# Patient Record
Sex: Male | Born: 1970 | Race: Black or African American | Hispanic: Refuse to answer | Smoking: Current every day smoker
Health system: Southern US, Community
[De-identification: ages and names within clinical notes are randomized; demographics above are authoritative.]

## PROBLEM LIST (undated history)

## (undated) DIAGNOSIS — F4310 Post-traumatic stress disorder, unspecified: Secondary | ICD-10-CM

## (undated) DIAGNOSIS — F419 Anxiety disorder, unspecified: Secondary | ICD-10-CM

## (undated) DIAGNOSIS — I5022 Chronic systolic (congestive) heart failure: Secondary | ICD-10-CM

## (undated) DIAGNOSIS — IMO0001 Reserved for inherently not codable concepts without codable children: Secondary | ICD-10-CM

## (undated) DIAGNOSIS — I509 Heart failure, unspecified: Secondary | ICD-10-CM

## (undated) DIAGNOSIS — K219 Gastro-esophageal reflux disease without esophagitis: Secondary | ICD-10-CM

## (undated) DIAGNOSIS — G4733 Obstructive sleep apnea (adult) (pediatric): Secondary | ICD-10-CM

## (undated) DIAGNOSIS — Z95810 Presence of automatic (implantable) cardiac defibrillator: Secondary | ICD-10-CM

## (undated) DIAGNOSIS — I428 Other cardiomyopathies: Secondary | ICD-10-CM

## (undated) DIAGNOSIS — Z531 Procedure and treatment not carried out because of patient's decision for reasons of belief and group pressure: Secondary | ICD-10-CM

## (undated) DIAGNOSIS — Z720 Tobacco use: Secondary | ICD-10-CM

## (undated) DIAGNOSIS — F1490 Cocaine use, unspecified, uncomplicated: Secondary | ICD-10-CM

## (undated) DIAGNOSIS — I10 Essential (primary) hypertension: Secondary | ICD-10-CM

## (undated) DIAGNOSIS — Z950 Presence of cardiac pacemaker: Secondary | ICD-10-CM

## (undated) DIAGNOSIS — E785 Hyperlipidemia, unspecified: Secondary | ICD-10-CM

## (undated) DIAGNOSIS — E119 Type 2 diabetes mellitus without complications: Secondary | ICD-10-CM

## (undated) HISTORY — PX: FOOT FRACTURE SURGERY: SHX645

## (undated) HISTORY — PX: TONSILLECTOMY: SUR1361

## (undated) HISTORY — PX: BIV ICD INSERTION CRT-D: EP1195

## (undated) HISTORY — PX: ROTATOR CUFF REPAIR: SHX139

---
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
## 2019-01-17 ENCOUNTER — Emergency Department (HOSPITAL_COMMUNITY): Payer: Medicare Other

## 2019-01-17 ENCOUNTER — Encounter (HOSPITAL_COMMUNITY): Payer: Self-pay | Admitting: Emergency Medicine

## 2019-01-17 ENCOUNTER — Other Ambulatory Visit: Payer: Self-pay

## 2019-01-17 ENCOUNTER — Inpatient Hospital Stay (HOSPITAL_COMMUNITY)
Admission: EM | Admit: 2019-01-17 | Discharge: 2019-01-20 | DRG: 918 | Disposition: A | Discharge status: Home / Self Care | Payer: Medicare Other | Attending: Family Medicine | Admitting: Family Medicine

## 2019-01-17 DIAGNOSIS — I110 Hypertensive heart disease with heart failure: Secondary | ICD-10-CM | POA: Diagnosis present

## 2019-01-17 DIAGNOSIS — G43909 Migraine, unspecified, not intractable, without status migrainosus: Secondary | ICD-10-CM | POA: Diagnosis present

## 2019-01-17 DIAGNOSIS — Z801 Family history of malignant neoplasm of trachea, bronchus and lung: Secondary | ICD-10-CM

## 2019-01-17 DIAGNOSIS — E119 Type 2 diabetes mellitus without complications: Secondary | ICD-10-CM

## 2019-01-17 DIAGNOSIS — R079 Chest pain, unspecified: Secondary | ICD-10-CM | POA: Diagnosis not present

## 2019-01-17 DIAGNOSIS — E1169 Type 2 diabetes mellitus with other specified complication: Secondary | ICD-10-CM

## 2019-01-17 DIAGNOSIS — Z880 Allergy status to penicillin: Secondary | ICD-10-CM

## 2019-01-17 DIAGNOSIS — Z79899 Other long term (current) drug therapy: Secondary | ICD-10-CM

## 2019-01-17 DIAGNOSIS — Z8249 Family history of ischemic heart disease and other diseases of the circulatory system: Secondary | ICD-10-CM

## 2019-01-17 DIAGNOSIS — F4310 Post-traumatic stress disorder, unspecified: Secondary | ICD-10-CM | POA: Diagnosis present

## 2019-01-17 DIAGNOSIS — Z7902 Long term (current) use of antithrombotics/antiplatelets: Secondary | ICD-10-CM

## 2019-01-17 DIAGNOSIS — R202 Paresthesia of skin: Secondary | ICD-10-CM

## 2019-01-17 DIAGNOSIS — F418 Other specified anxiety disorders: Secondary | ICD-10-CM | POA: Diagnosis present

## 2019-01-17 DIAGNOSIS — F17210 Nicotine dependence, cigarettes, uncomplicated: Secondary | ICD-10-CM | POA: Diagnosis present

## 2019-01-17 DIAGNOSIS — F1410 Cocaine abuse, uncomplicated: Secondary | ICD-10-CM | POA: Diagnosis present

## 2019-01-17 DIAGNOSIS — I272 Pulmonary hypertension, unspecified: Secondary | ICD-10-CM | POA: Diagnosis present

## 2019-01-17 DIAGNOSIS — R0789 Other chest pain: Secondary | ICD-10-CM

## 2019-01-17 DIAGNOSIS — R519 Headache, unspecified: Secondary | ICD-10-CM

## 2019-01-17 DIAGNOSIS — F1420 Cocaine dependence, uncomplicated: Secondary | ICD-10-CM

## 2019-01-17 DIAGNOSIS — K219 Gastro-esophageal reflux disease without esophagitis: Secondary | ICD-10-CM | POA: Diagnosis present

## 2019-01-17 DIAGNOSIS — Z20828 Contact with and (suspected) exposure to other viral communicable diseases: Secondary | ICD-10-CM | POA: Diagnosis present

## 2019-01-17 DIAGNOSIS — Z91040 Latex allergy status: Secondary | ICD-10-CM

## 2019-01-17 DIAGNOSIS — F1010 Alcohol abuse, uncomplicated: Secondary | ICD-10-CM | POA: Diagnosis not present

## 2019-01-17 DIAGNOSIS — Z794 Long term (current) use of insulin: Secondary | ICD-10-CM

## 2019-01-17 DIAGNOSIS — E785 Hyperlipidemia, unspecified: Secondary | ICD-10-CM | POA: Diagnosis present

## 2019-01-17 DIAGNOSIS — I252 Old myocardial infarction: Secondary | ICD-10-CM

## 2019-01-17 DIAGNOSIS — I5022 Chronic systolic (congestive) heart failure: Secondary | ICD-10-CM | POA: Diagnosis not present

## 2019-01-17 DIAGNOSIS — T405X1A Poisoning by cocaine, accidental (unintentional), initial encounter: Principal | ICD-10-CM | POA: Diagnosis present

## 2019-01-17 DIAGNOSIS — H538 Other visual disturbances: Secondary | ICD-10-CM

## 2019-01-17 DIAGNOSIS — Z720 Tobacco use: Secondary | ICD-10-CM | POA: Diagnosis present

## 2019-01-17 DIAGNOSIS — I10 Essential (primary) hypertension: Secondary | ICD-10-CM | POA: Diagnosis present

## 2019-01-17 DIAGNOSIS — I428 Other cardiomyopathies: Secondary | ICD-10-CM | POA: Diagnosis present

## 2019-01-17 DIAGNOSIS — M542 Cervicalgia: Secondary | ICD-10-CM | POA: Diagnosis present

## 2019-01-17 DIAGNOSIS — G44209 Tension-type headache, unspecified, not intractable: Secondary | ICD-10-CM

## 2019-01-17 DIAGNOSIS — E1165 Type 2 diabetes mellitus with hyperglycemia: Secondary | ICD-10-CM | POA: Diagnosis present

## 2019-01-17 DIAGNOSIS — Z888 Allergy status to other drugs, medicaments and biological substances status: Secondary | ICD-10-CM

## 2019-01-17 DIAGNOSIS — Z950 Presence of cardiac pacemaker: Secondary | ICD-10-CM | POA: Diagnosis present

## 2019-01-17 DIAGNOSIS — G44201 Tension-type headache, unspecified, intractable: Secondary | ICD-10-CM | POA: Diagnosis present

## 2019-01-17 DIAGNOSIS — Z886 Allergy status to analgesic agent status: Secondary | ICD-10-CM

## 2019-01-17 DIAGNOSIS — Z91041 Radiographic dye allergy status: Secondary | ICD-10-CM

## 2019-01-17 DIAGNOSIS — R0602 Shortness of breath: Secondary | ICD-10-CM | POA: Diagnosis not present

## 2019-01-17 HISTORY — DX: Essential (primary) hypertension: I10

## 2019-01-17 HISTORY — DX: Anxiety disorder, unspecified: F41.9

## 2019-01-17 HISTORY — DX: Post-traumatic stress disorder, unspecified: F43.10

## 2019-01-17 HISTORY — DX: Gastro-esophageal reflux disease without esophagitis: K21.9

## 2019-01-17 HISTORY — DX: Hyperlipidemia, unspecified: E78.5

## 2019-01-17 HISTORY — DX: Type 2 diabetes mellitus without complications: E11.9

## 2019-01-17 LAB — CBC
HCT: 45.2 % (ref 39.0–52.0)
Hemoglobin: 14.2 g/dL (ref 13.0–17.0)
MCH: 28.7 pg (ref 26.0–34.0)
MCHC: 31.4 g/dL (ref 30.0–36.0)
MCV: 91.3 fL (ref 80.0–100.0)
Platelets: 258 10*3/uL (ref 150–400)
RBC: 4.95 MIL/uL (ref 4.22–5.81)
RDW: 13 % (ref 11.5–15.5)
WBC: 10.6 10*3/uL — ABNORMAL HIGH (ref 4.0–10.5)
nRBC: 0 % (ref 0.0–0.2)

## 2019-01-17 LAB — BASIC METABOLIC PANEL
Anion gap: 9 (ref 5–15)
BUN: 10 mg/dL (ref 6–20)
CO2: 26 mmol/L (ref 22–32)
Calcium: 8.8 mg/dL — ABNORMAL LOW (ref 8.9–10.3)
Chloride: 102 mmol/L (ref 98–111)
Creatinine, Ser: 0.84 mg/dL (ref 0.61–1.24)
GFR calc Af Amer: >60 mL/min (ref >60)
GFR calc non Af Amer: >60 mL/min (ref >60)
Glucose, Bld: 110 mg/dL — ABNORMAL HIGH (ref 70–99)
Potassium: 3.7 mmol/L (ref 3.5–5.1)
Sodium: 137 mmol/L (ref 135–145)

## 2019-01-17 LAB — DIFFERENTIAL
Abs Immature Granulocytes: 0.04 10*3/uL (ref 0.00–0.07)
Basophils Absolute: 0 10*3/uL (ref 0.0–0.1)
Basophils Relative: 0 %
Eosinophils Absolute: 0.1 10*3/uL (ref 0.0–0.5)
Eosinophils Relative: 1 %
Immature Granulocytes: 0 %
Lymphocytes Relative: 28 %
Lymphs Abs: 3 10*3/uL (ref 0.7–4.0)
Monocytes Absolute: 0.9 10*3/uL (ref 0.1–1.0)
Monocytes Relative: 9 %
Neutro Abs: 6.5 10*3/uL (ref 1.7–7.7)
Neutrophils Relative %: 62 %

## 2019-01-17 LAB — APTT: aPTT: 29 seconds (ref 24–36)

## 2019-01-17 LAB — TROPONIN I (HIGH SENSITIVITY)
Troponin I (High Sensitivity): 8 ng/L (ref <18)
Troponin I (High Sensitivity): 8 ng/L (ref <18)

## 2019-01-17 LAB — PROTIME-INR
INR: 0.9 (ref 0.8–1.2)
Prothrombin Time: 12.2 seconds (ref 11.4–15.2)

## 2019-01-17 LAB — CBG MONITORING, ED: Glucose-Capillary: 101 mg/dL — ABNORMAL HIGH (ref 70–99)

## 2019-01-17 MED ORDER — SODIUM CHLORIDE 0.9% FLUSH
3.0000 mL | Freq: Once | INTRAVENOUS | Status: AC
Start: 2019-01-17 — End: 2019-01-18
  Administered 2019-01-18: 15:00:00 3 mL via INTRAVENOUS

## 2019-01-17 MED ORDER — SODIUM CHLORIDE 0.9% FLUSH: 3.0000 mL | Freq: Once | INTRAVENOUS | Status: DC

## 2019-01-17 NOTE — ED Notes (Signed)
Pacemaker/defib successfully interrogated report provided to ER MD

## 2019-01-17 NOTE — ED Notes (Signed)
ED TO INPATIENT HANDOFF REPORT  ED Nurse Name and Phone #: Magnus Ivan, RN [phone]  S Name/Age/Gender Luis [name] 48 y.o. male Room/Bed: 021C/021C  Code Status   Code Status: Not on file  Home/SNF/Other Skilled nursing facility Patient oriented to: self, time and situation Is this baseline? No   Triage Complete: Triage complete  Chief Complaint chest pain/arm tingling  Triage Note Pt from a residential facility for vets coming via EMS. Pt started to have sharp pain in center of his chest and tingling in his chest and back of neck at 1300 yesterday. Neck pain has been constant and cp has been intermittent. 1900 last night, pt started having blurred vision and some tingling in left arm/hand. Intermittently since last night. Pt now complains of some tingling in left foot.  Pt has hx of CHF and has pacemaker/defib. No droop, no slurred speech, no one sided weakness. Pt thinks his CBG is lower for normal- he has been running in the 300's and today it is 87. Pt told EMS he started having SOB and nausea today.    Allergies Allergies  Allergen Reactions  . Asa [Aspirin]     Throat closing   . Augmentin [Amoxicillin-Pot Clavulanate]   . Contrast Media [Iodinated Diagnostic Agents]   . Lisinopril   . Penicillins     Level of Care/Admitting Diagnosis ED Disposition    None      B Medical/Surgery History Past Medical History:  Diagnosis Date  . Anxiety   . CHF (congestive heart failure) (HCC)   . Diabetes mellitus without complication (HCC)   . GERD (gastroesophageal reflux disease)   . HLD (hyperlipidemia)   . Hypertension   . Pacemaker    defib as well   History reviewed. No pertinent surgical history.   A IV Location/Drains/Wounds Patient Lines/Drains/Airways Status   Active Line/Drains/Airways    Name:   Placement date:   Placement time:   Site:   Days:   Peripheral IV 01/17/19 Left Antecubital   01/17/19    2145    Antecubital   less than 1           Intake/Output Last 24 hours No intake or output data in the 24 hours ending 01/17/19 2246  Labs/Imaging Results for orders placed or performed during the hospital encounter of 01/17/19 (from the past 48 hour(s))  Basic metabolic panel     Status: Abnormal   Collection Time: 01/17/19  6:17 PM  Result Value Ref Range   Sodium 137 135 - 145 mmol/L   Potassium 3.7 3.5 - 5.1 mmol/L   Chloride 102 98 - 111 mmol/L   CO2 26 22 - 32 mmol/L   Glucose, Bld 110 (H) 70 - 99 mg/dL   BUN 10 6 - 20 mg/dL   Creatinine, Ser 0.93 0.61 - 1.24 mg/dL   Calcium 8.8 (L) 8.9 - 10.3 mg/dL   GFR calc non Af Amer >60 >60 mL/min   GFR calc Af Amer >60 >60 mL/min   Anion gap 9 5 - 15    Comment: Performed at [facility], [address] [place], [place] [address]  CBC     Status: Abnormal   Collection Time: 01/17/19  6:17 PM  Result Value Ref Range   WBC 10.6 (H) 4.0 - 10.5 K/uL   RBC 4.95 4.22 - 5.81 MIL/uL   Hemoglobin 14.2 13.0 - 17.0 g/dL   HCT 24.4 69.5 - 07.2 %   MCV 91.3 80.0 - 100.0  fL   MCH 28.7 26.0 - 34.0 pg   MCHC 31.4 30.0 - 36.0 g/dL   RDW 13.0 11.5 - 15.5 %   Platelets 258 150 - 400 K/uL   nRBC 0.0 0.0 - 0.2 %    Comment: Performed at [facility], [place] [address], [place], [place] [address]  Troponin I (High Sensitivity)     Status: None   Collection Time: 01/17/19  6:17 PM  Result Value Ref Range   Troponin I (High Sensitivity) 8 <18 ng/L    Comment: (NOTE) Elevated high sensitivity troponin I (hsTnI) values and significant  changes across serial measurements may suggest ACS but many other  chronic and acute conditions are known to elevate hsTnI results.  Refer to the "Links" section for chest pain algorithms and additional  guidance. Performed at [facility], [place] [address], [place], [place] [address]   Protime-INR     Status: None   Collection Time: 01/17/19  6:17 PM  Result Value Ref Range   Prothrombin Time 12.2 11.4 - 15.2 seconds   INR 0.9 0.8 -  1.2    Comment: (NOTE) INR goal varies based on device and disease states. Performed at [facility], [place] [address], [place], [place] [address]   APTT     Status: None   Collection Time: 01/17/19  6:17 PM  Result Value Ref Range   aPTT 29 24 - 36 seconds    Comment: Performed at [facility] [address] [place], [place] [address]  Differential     Status: None   Collection Time: 01/17/19  6:17 PM  Result Value Ref Range   Neutrophils Relative % 62 %   Neutro Abs 6.5 1.7 - 7.7 K/uL   Lymphocytes Relative 28 %   Lymphs Abs 3.0 0.7 - 4.0 K/uL   Monocytes Relative 9 %   Monocytes Absolute 0.9 0.1 - 1.0 K/uL   Eosinophils Relative 1 %   Eosinophils Absolute 0.1 0.0 - 0.5 K/uL   Basophils Relative 0 %   Basophils Absolute 0.0 0.0 - 0.1 K/uL   Immature Granulocytes 0 %   Abs Immature Granulocytes 0.04 0.00 - 0.07 K/uL    Comment: Performed at [facility], [place] [address] [place], [place] [address]  Troponin I (High Sensitivity)     Status: None   Collection Time: 01/17/19  7:50 PM  Result Value Ref Range   Troponin I (High Sensitivity) 8 <18 ng/L    Comment: (NOTE) Elevated high sensitivity troponin I (hsTnI) values and significant  changes across serial measurements may suggest ACS but many other  chronic and acute conditions are known to elevate hsTnI results.  Refer to the "Links" section for chest pain algorithms and additional  guidance. Performed at [facility], [place] [address] [place],  [address]   CBG monitoring, ED     Status: Abnormal   Collection Time: 01/17/19  7:52 PM  Result Value Ref Range   Glucose-Capillary 101 (H) 70 - 99 mg/dL   Dg Chest 2 View  Result Date: 01/17/2019 CLINICAL DATA:  Central chest pain, shortness of breath and dizziness over the last day. EXAM: CHEST - 2 VIEW COMPARISON:  07/18/2018 FINDINGS: The heart is enlarged. Pacemaker/AICD in place. There is pulmonary venous hypertension but no frank edema. No  infiltrate, collapse or effusion. Lateral film is degraded by motion. IMPRESSION: Cardiomegaly. Pulmonary venous hypertension without frank edema. Slight worsening since yesterday.  Electronically Signed   By: Paulina FusiMark  Shogry M.D.   On: 01/17/2019 18:50   Ct Head Wo Contrast  Result Date: 01/17/2019 CLINICAL DATA:  Blurred vision and tingling of the left arm and hand beginning yesterday. EXAM: CT HEAD WITHOUT CONTRAST TECHNIQUE: Contiguous axial images were obtained from the base of the skull through the vertex without intravenous contrast. COMPARISON:  None. FINDINGS: Brain: Brain does not show any evidence of old or acute infarction, mass lesion, hemorrhage, hydrocephalus or extra-axial collection. Vascular: No abnormal vascular finding. Skull: Normal Sinuses/Orbits: Clear/normal Other: None IMPRESSION: Normal head CT. Electronically Signed   By: Paulina FusiMark  Shogry M.D.   On: 01/17/2019 19:51    Pending Labs Unresulted Labs (From admission, onward)    Start     Ordered   01/17/19 2238  SARS Coronavirus 2 (CEPHEID - Performed in [facility] lab), Hosp Order  (Asymptomatic Patients Labs)  ONCE - STAT,   STAT    Question:  Rule Out  Answer:  Yes   01/17/19 2238          Vitals/Pain Today's Vitals   01/17/19 2015 01/17/19 2030 01/17/19 2045 01/17/19 2100  BP: 118/67 (!) 106/56 122/72 (!) 121/102  Pulse: 77 74 78 80  Resp: 18 12 (!) 23 (!) 23  Temp:      TempSrc:      SpO2: 94% 92% 94% 92%  PainSc:        Isolation Precautions No active isolations  Medications Medications  sodium chloride flush (NS) 0.9 % injection 3 mL (has no administration in time range)  sodium chloride flush (NS) 0.9 % injection 3 mL (has no administration in time range)    Mobility walks Low fall risk   Focused Assessments Neuro Assessment Handoff:  Swallow screen pass? Yes  Cardiac Rhythm: Normal sinus rhythm NIH Stroke Scale ( + Modified Stroke Scale Criteria)  LOC Questions (1b. )   +: Answers  both questions correctly LOC Commands (1c. )   + : Performs both tasks correctly Best Gaze (2. )  +: Normal Visual (3. )  +: No visual loss Motor Arm, Left (5a. )   +: No drift Motor Arm, Right (5b. )   +: No drift Motor Leg, Left (6a. )   +: No drift Motor Leg, Right (6b. )   +: No drift Sensory (8. )   +: Mild-to-moderate sensory loss, patient feels pinprick is less sharp or is dull on the affected side, or there is a loss of superficial pain with pinprick, but patient is aware of being touched Best Language (9. )   +: No aphasia Extinction/Inattention (11.)   +: No Abnormality Modified SS Total  +: 1     Neuro Assessment: Within Defined Limits Neuro Checks:      Last Documented NIHSS Modified Score: 1 (01/17/19 2000) Has TPA been given? No If patient is a Neuro Trauma and patient is going to OR before floor call report to 4N Charge nurse: [phone] or [phone]     R Recommendations: See Admitting Provider Note  Report given to:   Additional Notes: NIH 1 for sensory; c/o "numbness" / "sensation" on back of neck radiating towards arm. 2 IV access on left side

## 2019-01-17 NOTE — ED Provider Notes (Signed)
[facility] EMERGENCY DEPARTMENT Provider Note   CSN: [id number] Arrival date & time: 01/17/19  1736     History   Chief Complaint Chief Complaint  Patient presents with  . Shortness of Breath  . Chest Pain  . Tingling    HPI Luis [name] is a 48 y.o. male.     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  Patient states around noon yesterday he had a severe 9 out of 10 pain in the center of his chest that went into his neck and his left arm causing some numbness and tingling.  He also stated he had a sensation of dizziness and nausea with it.  The pain lasted for approximately 6 minutes and resolved but he is continued to have sensation of dizziness which he describes as feeling like he may pass out when he tries to stand or walk.  He is felt generally not well and is also had some ongoing pain in his neck and tingling of his left toes.  He denies any weakness in the arm or leg or speech difficulty.  He has been taking all of his medications as prescribed.  Patient states also that his blood sugars have been running in the 300s because he was having difficulty obtaining his insulin due to price and has recently gotten back on his meds and blood sugar today was 87-100.  Patient also admits to being a heavy drinker in the past but has not had any alcohol recently in the last few weeks.  He had a second episode of chest pain today on his way here that lasted for 2 or 3 minutes but has resolved spontaneously.  The symptoms were not exertional in nature but he states it felt like when he had his heart attack.  He denies any recent fluid buildup or weight gain.  He continues to take 80  mg of Lasix daily.  He denies cough, congestion or fever.  He has had no abdominal pain or vomiting or diarrhea but has had some nausea.  The history is provided by the patient.    Past Medical History:  Diagnosis Date  . Anxiety   . CHF (congestive heart failure) ([place])   . Diabetes mellitus without complication ([place])   . GERD (gastroesophageal reflux disease)   . HLD (hyperlipidemia)   . Hypertension   . Pacemaker    defib as well    There are no active problems to display for this patient.   History reviewed. No pertinent surgical history.      Home Medications    Prior to Admission medications   Not on File    Family History History reviewed. No pertinent family history.  Social History Social History   Tobacco Use  . Smoking status: Current Every Day Smoker    Types: Cigarettes  . Smokeless tobacco: Never Used  Substance Use Topics  . Alcohol use: Yes  . Drug use: Not Currently     Allergies   Asa [aspirin], Augmentin [amoxicillin-pot clavulanate], Contrast media [iodinated diagnostic agents], Lisinopril, and Penicillins   Review of Systems Review of Systems  All other systems reviewed and are negative.  Physical Exam Updated Vital Signs BP (!) 121/102   Pulse 80   Temp 98 F (36.7 C) (Oral)   Resp (!) 23   SpO2 92%   Physical Exam Vitals signs and nursing note reviewed.  Constitutional:      General: He is not in acute distress.    Appearance: He is well-developed. He is obese.  HENT:     Head: Normocephalic and atraumatic.  Eyes:     Conjunctiva/sclera: Conjunctivae normal.     Pupils: Pupils are equal, round, and reactive to light.  Neck:     Musculoskeletal: Normal range of motion and neck supple.  Cardiovascular:     Rate and Rhythm: Normal rate and regular rhythm.     Heart sounds: No murmur.     Comments: Pulses present and equal in all ext Pulmonary:     Effort: Pulmonary effort is normal. No respiratory distress.      Breath sounds: Normal breath sounds. No wheezing or rales.  Abdominal:     General: There is no distension.     Palpations: Abdomen is soft.     Tenderness: There is no abdominal tenderness. There is no guarding or rebound.  Musculoskeletal: Normal range of motion.        General: No tenderness.     Right lower leg: No edema.     Left lower leg: No edema.  Skin:    General: Skin is warm and dry.     Findings: No erythema or rash.  Neurological:     Mental Status: He is alert and oriented to person, place, and time.     Comments: Mild subjective numbness in the left toes  Psychiatric:        Behavior: Behavior normal.      ED Treatments / Results  Labs (all labs ordered are listed, but only abnormal results are displayed) Labs Reviewed  BASIC METABOLIC PANEL - Abnormal; Notable for the following components:      Result Value   Glucose, Bld 110 (*)    Calcium 8.8 (*)    All other components within normal limits  CBC - Abnormal; Notable for the following components:   WBC 10.6 (*)    All other components within normal limits  CBG MONITORING, ED - Abnormal; Notable for the following components:   Glucose-Capillary 101 (*)    All other components within normal limits  PROTIME-INR  APTT  DIFFERENTIAL  I-STAT CHEM 8, ED  TROPONIN I (HIGH SENSITIVITY)  TROPONIN I (HIGH SENSITIVITY)    EKG EKG Interpretation  Date/Time:  Tuesday January 17 2019 22:15:28 EDT Ventricular Rate:  81 PR Interval:    QRS Duration: 170 QT Interval:  458 QTC Calculation: 532 R Axis:   74 Text Interpretation:  VENTRICULAR PACED RHYTHM IVCD, consider atypical LBBB Confirmed by Gwyneth Sprout ([id number]) on 01/17/2019 10:52:02 PM   Radiology Dg Chest 2 View  Result Date: 01/17/2019 CLINICAL DATA:  Central chest pain, shortness of breath and dizziness over the last day. EXAM: CHEST - 2 VIEW COMPARISON:  07/18/2018 FINDINGS: The heart is enlarged. Pacemaker/AICD in place. There is pulmonary venous  hypertension but no frank edema. No infiltrate, collapse or effusion. Lateral film is degraded by motion. IMPRESSION: Cardiomegaly. Pulmonary venous hypertension without frank edema. Slight worsening since yesterday. Electronically Signed   By: Paulina Fusi M.D.   On: 01/17/2019 18:50   Ct Head Wo Contrast  Result Date: 01/17/2019 CLINICAL DATA:  Blurred vision and tingling of the left  arm and hand beginning yesterday. EXAM: CT HEAD WITHOUT CONTRAST TECHNIQUE: Contiguous axial images were obtained from the base of the skull through the vertex without intravenous contrast. COMPARISON:  None. FINDINGS: Brain: Brain does not show any evidence of old or acute infarction, mass lesion, hemorrhage, hydrocephalus or extra-axial collection. Vascular: No abnormal vascular finding. Skull: Normal Sinuses/Orbits: Clear/normal Other: None IMPRESSION: Normal head CT. Electronically Signed   By: Paulina FusiMark  Shogry M.D.   On: 01/17/2019 19:51    Procedures Procedures (including critical care time)  Medications Ordered in ED Medications  sodium chloride flush (NS) 0.9 % injection 3 mL (has no administration in time range)  sodium chloride flush (NS) 0.9 % injection 3 mL (has no administration in time range)     Initial Impression / Assessment and Plan / ED Course  I have reviewed the triage vital signs and the nursing notes.  Pertinent labs & imaging results that were available during my care of the patient were reviewed by me and considered in my medical decision making (see chart for details).        48 year old male with significant heart history presenting with chest pain that started yesterday.  He has had 2 episodes of pain but they did not last more than 10 minutes.  He is also generally not felt well today with blurred vision, dizziness upon standing and feeling like he may pass out.  Patient's labs are relatively normal with delta troponin that are negative.  He has equal pulses throughout all extremities  and low suspicion for dissection.  Patient is on Plavix and does not take aspirin due to allergy.  Patient is not having any pain at this time.  Patient denies any infectious symptoms and has sounds bilaterally.  Patient's EKG with a paced rhythm.  [employer] pacer interrogation showed one episode of nonsustained V. tach on 627 that did not require shock.  Otherwise no specific abnormalities.  Patient is a heart score of 5 and given past history, known cardiomyopathy and chest pain feel that he will need a rule out.  It is possible patient's symptoms are related to improve blood sugar.  He had been running in the 300s and recently got back on his insulin and sugar today is 87-100.  However he is not having strokelike symptoms and head CT was negative.  Final Clinical Impressions(s) / ED Diagnoses   Final diagnoses:  Atypical chest pain  Tingling    ED Discharge Orders    None       Gwyneth SproutPlunkett, Kirstin Kugler, MD 01/17/19 2252

## 2019-01-17 NOTE — ED Triage Notes (Signed)
Pt from a residential facility for vets coming via EMS. Pt started to have sharp pain in center of his chest and tingling in his chest and back of neck at 1300 yesterday. Neck pain has been constant and cp has been intermittent. 1900 last night, pt started having blurred vision and some tingling in left arm/hand. Intermittently since last night. Pt now complains of some tingling in left foot.  Pt has hx of CHF and has pacemaker/defib. No droop, no slurred speech, no one sided weakness. Pt thinks his CBG is lower for normal- he has been running in the 300's and today it is 87. Pt told EMS he started having SOB and nausea today.

## 2019-01-17 NOTE — H&P (Signed)
History and Physical    Luis [name] ZOX:[id number] DOB: 12/20/1970 DOA: 01/17/2019  Referring MD/NP/PA:   PCP: System, Pcp Not In   Patient coming from:  The patient is coming from home.  At baseline, pt is independent for most of ADL.        Chief Complaint: chest pain  HPI: Luis [name] is a 48 y.o. male with medical history significant of hypertension, hyperlipidemia, diabetes mellitus, GERD, depression with anxiety, sCHF (EF 25-30% in 04/2018 per EDP), pacemaker placement, polysubstance abuse (tobacco, cocaine, alcohol), CAD, who presents with chest pain.  Patient states that his chest pain started yesterday at about noon.  It is located in the center chest, initially 9 out of 10 in severity, currently chest pain is minimal, initially sharp pain, currently pressure-like pain, radiating to the left arm and the left neck, causing left arm tingling.  It is associated with mild shortness breath, but no cough, fever or chills.   Patient also reports intermittent blurry vision, left foot tingling, left arm tingling, bilateral leg weakness, sometimes poor balance, dizziness, headache.  No vision loss or hearing loss.  No difficulty speaking.  Patient has nausea, but no vomiting, diarrhea or abdominal pain.  Denies symptoms of UTI.  ED Course: pt was found to have troponin VIII which is negative, INR 0.9, pending COVID-19 test, electrolytes renal function okay, temperature normal, blood pressure 121/102, heart rate 80, oxygen saturation 92 to 99% on room air, chest x-ray showed cardiomegaly without pulmonary edema or infiltration.  CT head is negative for acute intracranial abnormalities.  Patient is placed on telemetry bed for observation.  Review of Systems:   General: no fevers, chills, no body weight gain, has fatigue HEENT: no blurry vision, hearing changes or sore throat Respiratory: has dyspnea, no coughing, wheezing CV: has chest pain, no palpitations GI: has nausea, no vomiting,  abdominal pain, diarrhea, constipation GU: no dysuria, burning on urination, increased urinary frequency, hematuria  Ext: no leg edema Neuro: no unilateral weakness, numbness, or tingling, no vision change or hearing loss Skin: no rash, no skin tear. MSK: No muscle spasm, no deformity, no limitation of range of movement in spin Heme: No easy bruising.  Travel history: No recent long distant travel.  Allergy:  Allergies  Allergen Reactions  . Aspirin Anaphylaxis    Throat closing   . Iodine-131 Hives    Hives  . Iodinated Diagnostic Agents Hives  . Latex Rash and Hives  . Penicillins Nausea And Vomiting  . Amoxicillin-Pot Clavulanate Diarrhea  . Lisinopril     Other reaction(s): Cough    Past Medical History:  Diagnosis Date  . Anxiety   . CHF (congestive heart failure) (HCC)   . Diabetes mellitus without complication (HCC)   . GERD (gastroesophageal reflux disease)   . HLD (hyperlipidemia)   . Hypertension   . Pacemaker    defib as well    Past Surgical History:  Procedure Laterality Date  . PACEMAKER PLACEMENT      Social History:  reports that he has been smoking cigarettes. He has never used smokeless tobacco. He reports current alcohol use. He reports previous drug use.  Family History:  Family History  Problem Relation Age of Onset  . Hypertension Mother   . Bone cancer Father   . Lung cancer Father      Prior to Admission medications   Not on File    Physical Exam: Vitals:   01/17/19 2200 01/17/19 2215 01/17/19 2230 01/17/19 2245  BP: 115/65 109/63 108/73 116/78  Pulse: 77 77 79 75  Resp: (!) 1 16 14 13   Temp:      TempSrc:      SpO2: 93% 93% 91% 92%   General: Not in acute distress HEENT:       Eyes: PERRL, EOMI, no scleral icterus.       ENT: No discharge from the ears and nose, no pharynx injection, no tonsillar enlargement.        Neck: No JVD, no bruit, no mass felt. Heme: No neck lymph node enlargement. Cardiac: S1/S2, RRR, No  murmurs, No gallops or rubs. Respiratory: No rales, wheezing, rhonchi or rubs. GI: Soft, nondistended, nontender, no rebound pain, no organomegaly, BS present. GU: No hematuria Ext: No pitting leg edema bilaterally. 2+DP/PT pulse bilaterally. Musculoskeletal: No joint deformities, No joint redness or warmth, no limitation of ROM in spin. Skin: No rashes.  Neuro: Alert, oriented X3, cranial nerves II-XII grossly intact, moves all extremities normally.  Psych: Patient is not psychotic, no suicidal or hemocidal ideation.  Labs on Admission: I have personally reviewed following labs and imaging studies  CBC: Recent Labs  Lab 01/17/19 1817  WBC 10.6*  NEUTROABS 6.5  HGB 14.2  HCT 45.2  MCV 91.3  PLT 258   Basic Metabolic Panel: Recent Labs  Lab 01/17/19 1817  NA 137  K 3.7  CL 102  CO2 26  GLUCOSE 110*  BUN 10  CREATININE 0.84  CALCIUM 8.8*   GFR: CrCl cannot be calculated (Unknown ideal weight.). Liver Function Tests: No results for input(s): AST, ALT, ALKPHOS, BILITOT, PROT, ALBUMIN in the last 168 hours. No results for input(s): LIPASE, AMYLASE in the last 168 hours. No results for input(s): AMMONIA in the last 168 hours. Coagulation Profile: Recent Labs  Lab 01/17/19 1817  INR 0.9   Cardiac Enzymes: No results for input(s): CKTOTAL, CKMB, CKMBINDEX, TROPONINI in the last 168 hours. BNP (last 3 results) No results for input(s): PROBNP in the last 8760 hours. HbA1C: No results for input(s): HGBA1C in the last 72 hours. CBG: Recent Labs  Lab 01/17/19 1952  GLUCAP 101*   Lipid Profile: No results for input(s): CHOL, HDL, LDLCALC, TRIG, CHOLHDL, LDLDIRECT in the last 72 hours. Thyroid Function Tests: No results for input(s): TSH, T4TOTAL, FREET4, T3FREE, THYROIDAB in the last 72 hours. Anemia Panel: No results for input(s): VITAMINB12, FOLATE, FERRITIN, TIBC, IRON, RETICCTPCT in the last 72 hours. Urine analysis: No results found for: COLORURINE,  APPEARANCEUR, LABSPEC, PHURINE, GLUCOSEU, HGBUR, BILIRUBINUR, KETONESUR, PROTEINUR, UROBILINOGEN, NITRITE, LEUKOCYTESUR Sepsis Labs: @LABRCNTIP (procalcitonin:4,lacticidven:4) )No results found for this or any previous visit (from the past 240 hour(s)).   Radiological Exams on Admission: Dg Chest 2 View  Result Date: 01/17/2019 CLINICAL DATA:  Central chest pain, shortness of breath and dizziness over the last day. EXAM: CHEST - 2 VIEW COMPARISON:  07/18/2018 FINDINGS: The heart is enlarged. Pacemaker/AICD in place. There is pulmonary venous hypertension but no frank edema. No infiltrate, collapse or effusion. Lateral film is degraded by motion. IMPRESSION: Cardiomegaly. Pulmonary venous hypertension without frank edema. Slight worsening since yesterday. Electronically Signed   By: Paulina Fusi M.D.   On: 01/17/2019 18:50   Ct Head Wo Contrast  Result Date: 01/17/2019 CLINICAL DATA:  Blurred vision and tingling of the left arm and hand beginning yesterday. EXAM: CT HEAD WITHOUT CONTRAST TECHNIQUE: Contiguous axial images were obtained from the base of the skull through the vertex without intravenous contrast. COMPARISON:  None. FINDINGS: Brain: Brain  does not show any evidence of old or acute infarction, mass lesion, hemorrhage, hydrocephalus or extra-axial collection. Vascular: No abnormal vascular finding. Skull: Normal Sinuses/Orbits: Clear/normal Other: None IMPRESSION: Normal head CT. Electronically Signed   By: Nelson Chimes M.D.   On: 01/17/2019 19:51     EKG: Independently reviewed.  Sinus rhythm, QTC 532, paced rhythm   Assessment/Plan Principal Problem:   Chest pain Active Problems:   Chronic systolic CHF (congestive heart failure) (HCC)   Pacemaker   HLD (hyperlipidemia)   Diabetes mellitus without complication (HCC)   Hypertension   GERD (gastroesophageal reflux disease)   Tobacco abuse   Alcohol abuse   Chest pain: Patient seems to have atypical chest pain.  Initially  sharp pain, now pressure-like pain.  Troponin negative, 8, 8, 9.  - will place on Tele bed for obs - Trend Trop - Repeat EKG in the am  - prn Nitroglycerin, Morphine, plavix, and metoprolol - pt is allergic to ASA - Risk factor stratification: will check FLP and A1C  - check UDS - Card consult was requested via Epic  Chronic systolic CHF (congestive heart failure) ([place]): 2D echo on 09/15/2017 showed EF 50- 55%, per ED physician, patient had EF 25-30% on 04/2018 in another facility.  Patient does not have leg edema or JVD.  No pulmonary edema on chest x-ray.  BNP 38.5 CHF seem to be compensated. -Continue metoprolol and Lasix 80 mg daily  HLD (hyperlipidemia): not taking meds at home -f/u FLP  HTN:  -Continue home medications: Metoprolol, Lasix -also on prazosin -IV hydralazine prn  Diabetes mellitus without complication ([place]): Last A1c not on record. Patient is taking 75/25 insulin at home -will decrease dose of 75/25 insulin from 60 units tid to 40 units tid -SSI  GERD (gastroesophageal reflux disease): -Protonix  Tobacco abuse and Alcohol abuse: -Did counseling about importance of quitting smoking -Nicotine patch -Did counseling about the importance of quitting drinking -CIWA protocol  Left arm tingling, left foot tingling, blurry vision, dizziness: Patient has multiple nonspecific symptoms.  Etiology is not clear.  Not sure if this is referred symptoms of from his chest pain.  CT head is negative for acute intracranial abnormalities.  Cannot do MRI due to presence of pacemaker. -Check vitamin D32, folic acid level, TSH.   DVT ppx: SQ Heparin   Code Status: Full code Family Communication: None at bed side.  Disposition Plan:  Anticipate discharge back to previous home environment Consults called:  none Admission status: Obs / tele    Date of Service 01/17/2019    [facility]   If 7PM-7AM, please contact night-coverage [email or web address] Password  [facility] 01/17/2019, 11:38 PM

## 2019-01-18 ENCOUNTER — Encounter (HOSPITAL_COMMUNITY): Payer: Self-pay | Admitting: Neurology

## 2019-01-18 DIAGNOSIS — R200 Anesthesia of skin: Secondary | ICD-10-CM | POA: Diagnosis not present

## 2019-01-18 DIAGNOSIS — I10 Essential (primary) hypertension: Secondary | ICD-10-CM

## 2019-01-18 DIAGNOSIS — Z95810 Presence of automatic (implantable) cardiac defibrillator: Secondary | ICD-10-CM

## 2019-01-18 DIAGNOSIS — F1410 Cocaine abuse, uncomplicated: Secondary | ICD-10-CM | POA: Diagnosis not present

## 2019-01-18 DIAGNOSIS — G44201 Tension-type headache, unspecified, intractable: Secondary | ICD-10-CM

## 2019-01-18 DIAGNOSIS — Z20828 Contact with and (suspected) exposure to other viral communicable diseases: Secondary | ICD-10-CM | POA: Diagnosis not present

## 2019-01-18 DIAGNOSIS — E785 Hyperlipidemia, unspecified: Secondary | ICD-10-CM | POA: Diagnosis not present

## 2019-01-18 DIAGNOSIS — I252 Old myocardial infarction: Secondary | ICD-10-CM | POA: Diagnosis not present

## 2019-01-18 DIAGNOSIS — Z950 Presence of cardiac pacemaker: Secondary | ICD-10-CM | POA: Diagnosis not present

## 2019-01-18 DIAGNOSIS — E119 Type 2 diabetes mellitus without complications: Secondary | ICD-10-CM | POA: Diagnosis not present

## 2019-01-18 DIAGNOSIS — Z886 Allergy status to analgesic agent status: Secondary | ICD-10-CM | POA: Diagnosis not present

## 2019-01-18 DIAGNOSIS — M542 Cervicalgia: Secondary | ICD-10-CM | POA: Diagnosis present

## 2019-01-18 DIAGNOSIS — F418 Other specified anxiety disorders: Secondary | ICD-10-CM | POA: Diagnosis not present

## 2019-01-18 DIAGNOSIS — R079 Chest pain, unspecified: Secondary | ICD-10-CM | POA: Diagnosis not present

## 2019-01-18 DIAGNOSIS — E1165 Type 2 diabetes mellitus with hyperglycemia: Secondary | ICD-10-CM | POA: Diagnosis not present

## 2019-01-18 DIAGNOSIS — I428 Other cardiomyopathies: Secondary | ICD-10-CM | POA: Diagnosis not present

## 2019-01-18 DIAGNOSIS — R0789 Other chest pain: Secondary | ICD-10-CM

## 2019-01-18 DIAGNOSIS — I272 Pulmonary hypertension, unspecified: Secondary | ICD-10-CM | POA: Diagnosis present

## 2019-01-18 DIAGNOSIS — G43909 Migraine, unspecified, not intractable, without status migrainosus: Secondary | ICD-10-CM | POA: Diagnosis present

## 2019-01-18 DIAGNOSIS — F17210 Nicotine dependence, cigarettes, uncomplicated: Secondary | ICD-10-CM | POA: Diagnosis not present

## 2019-01-18 DIAGNOSIS — I5022 Chronic systolic (congestive) heart failure: Secondary | ICD-10-CM | POA: Diagnosis not present

## 2019-01-18 DIAGNOSIS — T405X1A Poisoning by cocaine, accidental (unintentional), initial encounter: Secondary | ICD-10-CM | POA: Diagnosis not present

## 2019-01-18 DIAGNOSIS — Z888 Allergy status to other drugs, medicaments and biological substances status: Secondary | ICD-10-CM | POA: Diagnosis not present

## 2019-01-18 DIAGNOSIS — F4310 Post-traumatic stress disorder, unspecified: Secondary | ICD-10-CM | POA: Diagnosis present

## 2019-01-18 DIAGNOSIS — R0602 Shortness of breath: Secondary | ICD-10-CM | POA: Diagnosis present

## 2019-01-18 DIAGNOSIS — I110 Hypertensive heart disease with heart failure: Secondary | ICD-10-CM | POA: Diagnosis not present

## 2019-01-18 DIAGNOSIS — K219 Gastro-esophageal reflux disease without esophagitis: Secondary | ICD-10-CM | POA: Diagnosis not present

## 2019-01-18 DIAGNOSIS — F1010 Alcohol abuse, uncomplicated: Secondary | ICD-10-CM | POA: Diagnosis present

## 2019-01-18 DIAGNOSIS — R51 Headache: Secondary | ICD-10-CM

## 2019-01-18 DIAGNOSIS — H538 Other visual disturbances: Secondary | ICD-10-CM | POA: Diagnosis present

## 2019-01-18 DIAGNOSIS — Z880 Allergy status to penicillin: Secondary | ICD-10-CM | POA: Diagnosis not present

## 2019-01-18 LAB — GLUCOSE, CAPILLARY
Glucose-Capillary: 113 mg/dL — ABNORMAL HIGH (ref 70–99)
Glucose-Capillary: 117 mg/dL — ABNORMAL HIGH (ref 70–99)
Glucose-Capillary: 100 mg/dL — ABNORMAL HIGH (ref 70–99)
Glucose-Capillary: 106 mg/dL — ABNORMAL HIGH (ref 70–99)
Glucose-Capillary: 122 mg/dL — ABNORMAL HIGH (ref 70–99)

## 2019-01-18 LAB — TROPONIN I (HIGH SENSITIVITY)
Troponin I (High Sensitivity): 9 ng/L (ref <18)
Troponin I (High Sensitivity): 9 ng/L (ref <18)
Troponin I (High Sensitivity): 9 ng/L (ref <18)

## 2019-01-18 LAB — RAPID URINE DRUG SCREEN, HOSP PERFORMED
Amphetamines: NOT DETECTED
Barbiturates: NOT DETECTED
Benzodiazepines: NOT DETECTED
Cocaine: NOT DETECTED
Opiates: NOT DETECTED
Tetrahydrocannabinol: NOT DETECTED

## 2019-01-18 LAB — LIPID PANEL
Cholesterol: 110 mg/dL (ref 0–200)
HDL: 39 mg/dL — ABNORMAL LOW (ref >40)
LDL Cholesterol: 52 mg/dL (ref 0–99)
Total CHOL/HDL Ratio: 2.8 RATIO
Triglycerides: 95 mg/dL (ref <150)
VLDL: 19 mg/dL (ref 0–40)

## 2019-01-18 LAB — SEDIMENTATION RATE: Sed Rate: 2 mm/hr (ref 0–16)

## 2019-01-18 LAB — HIV ANTIBODY (ROUTINE TESTING W REFLEX): HIV Screen 4th Generation wRfx: NONREACTIVE

## 2019-01-18 LAB — VITAMIN B12: Vitamin B-12: 250 pg/mL (ref 180–914)

## 2019-01-18 LAB — HEMOGLOBIN A1C
Hgb A1c MFr Bld: 11.2 % — ABNORMAL HIGH (ref 4.8–5.6)
Mean Plasma Glucose: 274.74 mg/dL

## 2019-01-18 LAB — BRAIN NATRIURETIC PEPTIDE: B Natriuretic Peptide: 38.5 pg/mL (ref 0.0–100.0)

## 2019-01-18 LAB — FOLATE: Folate: 5.5 ng/mL — ABNORMAL LOW (ref >5.9)

## 2019-01-18 LAB — SARS CORONAVIRUS 2 BY RT PCR (HOSPITAL ORDER, PERFORMED IN ~~LOC~~ HOSPITAL LAB): SARS Coronavirus 2: NEGATIVE

## 2019-01-18 LAB — TSH: TSH: 1.179 u[IU]/mL (ref 0.350–4.500)

## 2019-01-18 LAB — AMMONIA: Ammonia: 55 umol/L — ABNORMAL HIGH (ref 9–35)

## 2019-01-18 LAB — VALPROIC ACID LEVEL: Valproic Acid Lvl: 35 ug/mL — ABNORMAL LOW (ref 50.0–100.0)

## 2019-01-18 LAB — C-REACTIVE PROTEIN: CRP: <0.8 mg/dL (ref <1.0)

## 2019-01-18 MED ORDER — NITROGLYCERIN 0.4 MG SL SUBL: 0.4000 mg | SUBLINGUAL_TABLET | SUBLINGUAL | Status: DC | PRN

## 2019-01-18 MED ORDER — VITAMIN B-1 100 MG PO TABS
100.0000 mg | ORAL_TABLET | Freq: Every day | ORAL | Status: DC
  Administered 2019-01-18 – 2019-01-20 (×3): 100 mg via ORAL
  Filled 2019-01-18 (×3): qty 1

## 2019-01-18 MED ORDER — ADULT MULTIVITAMIN W/MINERALS CH
1.0000 | ORAL_TABLET | Freq: Every day | ORAL | Status: DC
  Administered 2019-01-18 – 2019-01-20 (×3): 1 via ORAL
  Filled 2019-01-18 (×3): qty 1

## 2019-01-18 MED ORDER — PREDNISONE 50 MG PO TABS
50.0000 mg | ORAL_TABLET | Freq: Four times a day (QID) | ORAL | Status: AC
  Administered 2019-01-18 – 2019-01-19 (×3): 50 mg via ORAL
  Filled 2019-01-18 (×3): qty 1

## 2019-01-18 MED ORDER — PANTOPRAZOLE SODIUM 40 MG PO TBEC
40.0000 mg | DELAYED_RELEASE_TABLET | Freq: Every day | ORAL | Status: DC
  Administered 2019-01-18 – 2019-01-20 (×3): 40 mg via ORAL
  Filled 2019-01-18 (×3): qty 1

## 2019-01-18 MED ORDER — LORAZEPAM 2 MG/ML IJ SOLN
0.0000 mg | Freq: Four times a day (QID) | INTRAMUSCULAR | Status: AC
  Administered 2019-01-18 (×2): 1 mg via INTRAVENOUS
  Filled 2019-01-18 (×2): qty 1

## 2019-01-18 MED ORDER — LORAZEPAM 2 MG/ML IJ SOLN
1.0000 mg | Freq: Four times a day (QID) | INTRAMUSCULAR | Status: DC | PRN
  Administered 2019-01-18: 1 mg via INTRAVENOUS
  Filled 2019-01-18: qty 1

## 2019-01-18 MED ORDER — SERTRALINE HCL 50 MG PO TABS
50.0000 mg | ORAL_TABLET | Freq: Three times a day (TID) | ORAL | Status: DC
  Administered 2019-01-18 – 2019-01-20 (×7): 50 mg via ORAL
  Filled 2019-01-18 (×7): qty 1

## 2019-01-18 MED ORDER — FUROSEMIDE 80 MG PO TABS
80.0000 mg | ORAL_TABLET | Freq: Every day | ORAL | Status: DC
  Administered 2019-01-18 – 2019-01-20 (×3): 80 mg via ORAL
  Filled 2019-01-18 (×3): qty 1

## 2019-01-18 MED ORDER — INSULIN ASPART 100 UNIT/ML ~~LOC~~ SOLN
0.0000 [IU] | Freq: Three times a day (TID) | SUBCUTANEOUS | Status: DC
  Administered 2019-01-19 (×2): 5 [IU] via SUBCUTANEOUS
  Administered 2019-01-19: 3 [IU] via SUBCUTANEOUS
  Administered 2019-01-20: 1 [IU] via SUBCUTANEOUS
  Administered 2019-01-20: 07:00:00 2 [IU] via SUBCUTANEOUS

## 2019-01-18 MED ORDER — DIPHENHYDRAMINE HCL 50 MG/ML IJ SOLN: 50.0000 mg | Freq: Once | INTRAMUSCULAR | Status: AC

## 2019-01-18 MED ORDER — THIAMINE HCL 100 MG/ML IJ SOLN: 100.0000 mg | Freq: Every day | INTRAMUSCULAR | Status: DC

## 2019-01-18 MED ORDER — ACETAMINOPHEN 325 MG PO TABS
650.0000 mg | ORAL_TABLET | Freq: Four times a day (QID) | ORAL | Status: DC | PRN
  Administered 2019-01-18: 07:00:00 650 mg via ORAL
  Filled 2019-01-18: qty 2

## 2019-01-18 MED ORDER — NICOTINE 21 MG/24HR TD PT24
21.0000 mg | MEDICATED_PATCH | Freq: Every day | TRANSDERMAL | Status: DC
  Filled 2019-01-18 (×2): qty 1

## 2019-01-18 MED ORDER — MORPHINE SULFATE (PF) 2 MG/ML IV SOLN: 2.0000 mg | INTRAVENOUS | Status: DC | PRN

## 2019-01-18 MED ORDER — HEPARIN SODIUM (PORCINE) 5000 UNIT/ML IJ SOLN
5000.0000 [IU] | Freq: Three times a day (TID) | INTRAMUSCULAR | Status: DC
  Administered 2019-01-18 – 2019-01-20 (×6): 5000 [IU] via SUBCUTANEOUS
  Filled 2019-01-18 (×6): qty 1

## 2019-01-18 MED ORDER — LORAZEPAM 2 MG/ML IJ SOLN: 0.0000 mg | Freq: Two times a day (BID) | INTRAMUSCULAR | Status: DC

## 2019-01-18 MED ORDER — HYDRALAZINE HCL 20 MG/ML IJ SOLN: 5.0000 mg | INTRAMUSCULAR | Status: DC | PRN

## 2019-01-18 MED ORDER — TRAZODONE HCL 100 MG PO TABS
100.0000 mg | ORAL_TABLET | Freq: Every day | ORAL | Status: DC
  Administered 2019-01-18 – 2019-01-19 (×3): 100 mg via ORAL
  Filled 2019-01-18 (×3): qty 1

## 2019-01-18 MED ORDER — METOPROLOL SUCCINATE ER 50 MG PO TB24
150.0000 mg | ORAL_TABLET | Freq: Every morning | ORAL | Status: DC
  Administered 2019-01-18 – 2019-01-20 (×3): 150 mg via ORAL
  Filled 2019-01-18 (×3): qty 1

## 2019-01-18 MED ORDER — CLOPIDOGREL BISULFATE 75 MG PO TABS
75.0000 mg | ORAL_TABLET | Freq: Every day | ORAL | Status: DC
  Administered 2019-01-18 – 2019-01-20 (×3): 75 mg via ORAL
  Filled 2019-01-18 (×3): qty 1

## 2019-01-18 MED ORDER — METOPROLOL TARTRATE 25 MG PO TABS: 25.0000 mg | ORAL_TABLET | Freq: Once | ORAL | Status: DC | PRN

## 2019-01-18 MED ORDER — PRAZOSIN HCL 2 MG PO CAPS
2.0000 mg | ORAL_CAPSULE | Freq: Every day | ORAL | Status: DC
  Administered 2019-01-18 – 2019-01-19 (×3): 2 mg via ORAL
  Filled 2019-01-18 (×4): qty 1

## 2019-01-18 MED ORDER — DIPHENHYDRAMINE HCL 25 MG PO CAPS
50.0000 mg | ORAL_CAPSULE | Freq: Once | ORAL | Status: AC
  Administered 2019-01-19: 09:00:00 50 mg via ORAL
  Filled 2019-01-18: qty 2

## 2019-01-18 MED ORDER — ROSUVASTATIN CALCIUM 20 MG PO TABS
20.0000 mg | ORAL_TABLET | Freq: Every day | ORAL | Status: DC
  Administered 2019-01-18 – 2019-01-20 (×3): 20 mg via ORAL
  Filled 2019-01-18 (×3): qty 1

## 2019-01-18 MED ORDER — INSULIN ASPART PROT & ASPART (70-30 MIX) 100 UNIT/ML ~~LOC~~ SUSP
40.0000 [IU] | Freq: Three times a day (TID) | SUBCUTANEOUS | Status: DC
  Administered 2019-01-18 – 2019-01-20 (×6): 40 [IU] via SUBCUTANEOUS
  Filled 2019-01-18: qty 10

## 2019-01-18 MED ORDER — DIVALPROEX SODIUM 250 MG PO DR TAB
500.0000 mg | DELAYED_RELEASE_TABLET | Freq: Two times a day (BID) | ORAL | Status: DC
  Administered 2019-01-18 – 2019-01-20 (×6): 500 mg via ORAL
  Filled 2019-01-18 (×6): qty 2

## 2019-01-18 MED ORDER — FOLIC ACID 1 MG PO TABS
1.0000 mg | ORAL_TABLET | Freq: Every day | ORAL | Status: DC
  Administered 2019-01-18 – 2019-01-20 (×3): 1 mg via ORAL
  Filled 2019-01-18 (×3): qty 1

## 2019-01-18 MED ORDER — LORAZEPAM 1 MG PO TABS
1.0000 mg | ORAL_TABLET | Freq: Four times a day (QID) | ORAL | Status: DC | PRN
  Administered 2019-01-18: 1 mg via ORAL
  Filled 2019-01-18: qty 1

## 2019-01-18 NOTE — Progress Notes (Signed)
Nutrition Brief Note  Patient identified on the Malnutrition Screening Tool (MST) Report  Wt Readings from Last 15 Encounters:  01/18/19 133.5 kg   Luis [name] is a 48 y.o. male with medical history significant of hypertension, hyperlipidemia, diabetes mellitus, GERD, depression with anxiety, sCHF (EF 25-30% in 04/2018 per EDP), pacemaker placement, polysubstance abuse (tobacco, cocaine, alcohol), CAD, who presents with chest pain.  Pt admitted with chest pain.   Reviewed I/O's: -600 ml x 24 hours  Medications reviewed and include folic acid, ativan, MVI, and thiamine.  Nutrition-Focused physical exam completed. Findings are no fat depletion, no muscle depletion, and no edema.   Lab Results  Component Value Date   HGBA1C 11.2 (H) 01/18/2019   PTA DM medications are 0-9 units insulin aspart TID with meals and 40 units insulin aspart protamine- aspart TID with meals).   Labs reviewed: CBGS: 117 (inpatient orders for glycemic control are 1.8 mg victoza befre breakfast ad 60 units humalog 75/25 TID).   Body mass index is 34.46 kg/m. Patient meets criteria for obesity, class I based on current BMI.   Current diet order is heart healthy/ carb modified, patient is consuming approximately n/a% of meals at this time. Labs and medications reviewed.   No nutrition interventions warranted at this time. If nutrition issues arise, please consult RD.   Tersea Aulds A. Jimmye Norman, RD, LDN, [place] Registered Dietitian II Certified Diabetes Care and Education Specialist Pager: [phone] After hours Pager: [phone]

## 2019-01-18 NOTE — Consult Note (Signed)
Neurology Consultation  Reason for Consult: Headache, blurred vision, neck pain, tingling sensation on the left face, neck, arm and leg Referring Physician: Lonny Prude  History is obtained from: Patient  HPI: Luis [name] is a 48 y.o. male with history of PTSD (on Depakote) congestive heart failure who has both pacemaker and also AICD, hypertension, hyperlipidemia, diabetes, anxiety.  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  Patient initially stated that he does not have a history of headaches, however then states that he gets headaches once almost every week and takes Tylenol or aspirin.  Headaches are usually short lasting are relieved by taking medications and have never been severe.  He denies photophobia and describes it more of a pressure like headache.  Does have a wife who does have migraine headaches and states his symptoms are not similiar.  He does complain of occasional nausea with the headaches.  2 days ago while watching TV on the couch, he noted that he had a sudden onset of pain in the back of his head which was located throughout the occipital region, this was followed by blurry vision in all fields which he states he still has.  He states at this point he started to have left neck pain with pain that was on the right arm down to his leg along with a tingling sensation.  He does state that the symptoms of left-sided arm and leg are intermittent and last for approximately 10 minutes when they do occur.  He also noted that he was staggering when he got up and this was associated with lightheadedness that lasted for only seconds.  Patient also admits to intermittent nausea along with intermittent dysarthria which only occurs for a few minutes.  He states that he does not have photophobia, phonophobia, scotoma, flashing lights.  Currently patient is having a 9/10 headache but is in no distress and is extremely lethargic secondary to not having sleep last night.   He has taken Tylenol for his headache but did not have any resolution of his headache.   ED course: Patient received a CT of head at that point he was transferred to 3 E. room 7.   Chart review (prior neurology notes/ discharge)  Work up that has been done: -CT of head - Rapid urine drug screen -Folate -Vitamin B12 -TSH -Troponin -Lipid panel -Hemoglobin A1c   ROS: 14 point ROS negative  Past Medical History:  Diagnosis Date  . Anxiety   . CHF (congestive heart failure) ([place])   . Diabetes mellitus without complication ([place])   . GERD (gastroesophageal reflux disease)   . HLD (hyperlipidemia)   . Hypertension   . Pacemaker    defib as well    Family History  Problem Relation Age of Onset  . Hypertension Mother   . Bone cancer Father   . Lung cancer Father     Social History:   reports that he has been smoking cigarettes. He has never used smokeless tobacco. He reports current alcohol use. He reports previous drug use.  Medications  Current Facility-Administered Medications:  .  acetaminophen (TYLENOL) tablet 650 mg, 650 mg, Oral, Q6H PRN, Ivor Costa, MD, 650 mg at 01/18/19 8841 .  clopidogrel (PLAVIX) tablet 75 mg, 75 mg, Oral, Daily, Ivor Costa, MD, 75 mg at 01/18/19 0955 .  divalproex (DEPAKOTE) DR tablet 500 mg, 500 mg, Oral, BID, Ivor Costa, MD, 500 mg at 01/18/19 0955 .  folic acid (FOLVITE) tablet 1  mg, 1 mg, Oral, Daily, Ivor Costa, MD, 1 mg at 01/18/19 0955 .  furosemide (LASIX) tablet 80 mg, 80 mg, Oral, Daily, Ivor Costa, MD, 80 mg at 01/18/19 0955 .  heparin injection 5,000 Units, 5,000 Units, Subcutaneous, Q8H, Ivor Costa, MD .  hydrALAZINE (APRESOLINE) injection 5 mg, 5 mg, Intravenous, Q2H PRN, Ivor Costa, MD .  insulin aspart (novoLOG) injection 0-9 Units, 0-9 Units, Subcutaneous, TID WC, Ivor Costa, MD .  insulin aspart protamine- aspart (NOVOLOG MIX 70/30) injection 40 Units, 40 Units, Subcutaneous, TID AC, Ivor Costa, MD .  LORazepam (ATIVAN)  injection 0-4 mg, 0-4 mg, Intravenous, Q6H, 1 mg at 01/18/19 0705 **FOLLOWED BY** [START ON 01/20/2019] LORazepam (ATIVAN) injection 0-4 mg, 0-4 mg, Intravenous, Q12H, Ivor Costa, MD .  LORazepam (ATIVAN) tablet 1 mg, 1 mg, Oral, Q6H PRN, 1 mg at 01/18/19 0145 **OR** LORazepam (ATIVAN) injection 1 mg, 1 mg, Intravenous, Q6H PRN, Ivor Costa, MD .  metoprolol succinate (TOPROL-XL) 24 hr tablet 150 mg, 150 mg, Oral, q morning - 10a, Ivor Costa, MD, 150 mg at 01/18/19 0955 .  morphine 2 MG/ML injection 2 mg, 2 mg, Intravenous, Q4H PRN, Ivor Costa, MD .  multivitamin with minerals tablet 1 tablet, 1 tablet, Oral, Daily, Ivor Costa, MD, 1 tablet at 01/18/19 0955 .  nicotine (NICODERM CQ - dosed in mg/24 hours) patch 21 mg, 21 mg, Transdermal, Daily, Ivor Costa, MD .  nitroGLYCERIN (NITROSTAT) SL tablet 0.4 mg, 0.4 mg, Sublingual, Q5 min PRN, Ivor Costa, MD .  pantoprazole (PROTONIX) EC tablet 40 mg, 40 mg, Oral, Daily, Ivor Costa, MD, 40 mg at 01/18/19 0955 .  prazosin (MINIPRESS) capsule 2 mg, 2 mg, Oral, QHS, Ivor Costa, MD, 2 mg at 01/18/19 0145 .  rosuvastatin (CRESTOR) tablet 20 mg, 20 mg, Oral, Daily, Ivor Costa, MD, 20 mg at 01/18/19 0955 .  sertraline (ZOLOFT) tablet 50 mg, 50 mg, Oral, TID, Ivor Costa, MD .  sodium chloride flush (NS) 0.9 % injection 3 mL, 3 mL, Intravenous, Once, Ivor Costa, MD .  sodium chloride flush (NS) 0.9 % injection 3 mL, 3 mL, Intravenous, Once, Ivor Costa, MD .  thiamine (VITAMIN B-1) tablet 100 mg, 100 mg, Oral, Daily, 100 mg at 01/18/19 0955 **OR** thiamine (B-1) injection 100 mg, 100 mg, Intravenous, Daily, Ivor Costa, MD .  traZODone (DESYREL) tablet 100 mg, 100 mg, Oral, QHS, Ivor Costa, MD, 100 mg at 01/18/19 0145   Exam: Current vital signs: BP (!) 105/59 (BP Location: Right Arm)   Pulse 83   Temp 97.6 F (36.4 C) (Oral)   Resp 18   Ht 6' 5.5" (1.969 m)   Wt 133.5 kg   SpO2 94%   BMI 34.46 kg/m  Vital signs in last 24 hours: Temp:  [97.6 F (36.4  C)-98 F (36.7 C)] 97.6 F (36.4 C) (07/22 0815) Pulse Rate:  [71-85] 83 (07/22 0815) Resp:  [1-23] 18 (07/22 0815) BP: (105-134)/(56-102) 105/59 (07/22 0815) SpO2:  [91 %-99 %] 94 % (07/22 0815) Weight:  [133.5 kg] 133.5 kg (07/22 0041)  Physical Exam  Constitutional: Appears well-developed and well-nourished.  Psych: Affect appropriate to situation Eyes: No scleral injection HENT: No OP obstrucion Head: Normocephalic.  Cardiovascular: Normal rate and regular rhythm.  Respiratory: Effort normal, non-labored breathing GI: Soft.  No distension. There is no tenderness.  Skin: WDI  Neuro: Mental Status: Patient is awake, alert, oriented to person, place, month, year, and situation. Patient is able to give a clear and coherent history.  No signs of aphasia or neglect Cranial Nerves: II: Visual Fields are full.  III,IV, VI: EOMI without ptosis or diploplia. Pupils equal, round and reactive to light V: Facial sensation is symmetric to temperature VII: Facial movement is symmetric.  VIII: hearing is intact to voice X: Palat elevates symmetrically XI: Shoulder shrug is symmetric. XII: tongue is midline without atrophy or fasciculations.  Motor: Tone is normal. Bulk is normal. 5/5 strength was present in all four extremities.  At one point patient was not giving full effort however upon retesting left leg he did have 5/5 Sensory: Slightly reduced sensation on the left side Deep Tendon Reflexes: 2+ and symmetric in the biceps and patellae.  Plantars: Toes are downgoing bilaterally.  Cerebellar: FNF and HKS are intact bilaterally  Labs I have reviewed labs in epic and the results pertinent to this consultation are:   CBC    Component Value Date/Time   WBC 10.6 (H) 01/17/2019 1817   RBC 4.95 01/17/2019 1817   HGB 14.2 01/17/2019 1817   HCT 45.2 01/17/2019 1817   PLT 258 01/17/2019 1817   MCV 91.3 01/17/2019 1817   MCH 28.7 01/17/2019 1817   MCHC 31.4 01/17/2019 1817    RDW 13.0 01/17/2019 1817   LYMPHSABS 3.0 01/17/2019 1817   MONOABS 0.9 01/17/2019 1817   EOSABS 0.1 01/17/2019 1817   BASOSABS 0.0 01/17/2019 1817    CMP     Component Value Date/Time   NA 137 01/17/2019 1817   K 3.7 01/17/2019 1817   CL 102 01/17/2019 1817   CO2 26 01/17/2019 1817   GLUCOSE 110 (H) 01/17/2019 1817   BUN 10 01/17/2019 1817   CREATININE 0.84 01/17/2019 1817   CALCIUM 8.8 (L) 01/17/2019 1817   GFRNONAA >60 01/17/2019 1817   GFRAA >60 01/17/2019 1817    Lipid Panel     Component Value Date/Time   CHOL 110 01/18/2019 0109   TRIG 95 01/18/2019 0109   HDL 39 (L) 01/18/2019 0109   CHOLHDL 2.8 01/18/2019 0109   VLDL 19 01/18/2019 0109   LDLCALC 52 01/18/2019 0109     Imaging-- should be noted the patient has a contrast dye allergy to which he says he breaks out in hives however if he does receive Benadryl he does not have this problem  I have reviewed the images obtained:  CT-scan of the brain- normal CT of head  MRI examination of the brain- pending  Etta Quill PA-C [facility] [phone]  M-F  (9:00 am- 5:00 PM)  01/18/2019, 11:33 AM    NEUROHOSPITALIST ADDENDUM Performed a face to face diagnostic evaluation.   I have reviewed the contents of history and physical exam as documented by PA/ARNP/Resident and agree with above documentation.  I have discussed and formulated the above plan as documented. Edits to the note have been made as needed.   ASSESSMENT AND PLAN  48 year old male with history of PTSD on Depakote, congestive heart failure with AICD, hypertension, hyperlipidemia, diabetes presents with headache of 2 days.  While initially denying headaches, later states he has small headaches usually on the right side, relieved with Tylenol or aspirin.  Now presents with 2-day history of headache, unilateral, associated with nausea but denies photophobia.  Has unilateral sensory symptoms and blurry vision, onset to peak was  approximately 1 hour and is currently 9 /10.  Patient also complained of chest pain and is undergoing cardiac work-up  Impression Headache  Recommendations We will need to rule out secondary causes of  headache such as dissection, stroke, temporal arteritis CRP normal, ESR pending MRI brain ordered, ICD apparently MRI compatible Also recommended CT angiogram of head and neck If negative, recommend VPA load if depakote level subtherapeutic. Also recommend migraine cocktail.         Karena Addison Aroor MD [facility] [phone]   If 7pm to 7am, please call on call as listed on AMION.

## 2019-01-18 NOTE — Consult Note (Signed)
Cardiology Consultation:   Patient ID: Luis [name] MRN: [id number]; DOB: 06-05-1971  Admit date: 01/17/2019 Date of Consult: 01/18/2019  Primary Care Provider: System, Pcp Not In Primary Cardiologist: [facility] Primary Electrophysiologist:  None   Patient Profile:   Luis [name] is a 48 y.o. male with a history of possible CAD, chronic systolic CHF/non-ischemic cardiomyopathy with EF of 25-30% on Echo in 04/2018 s/p BiV ICD, sleep apnea on CPAP since age of 60,  hypertension, hyperlipidemia, diabetes mellitus, cocaine use, tobacco use anxiety, and depression, who is  being seen today for the evaluation of chest pain at the request of Dr. Blaine Hamper (Internal Medicine).  History of Present Illness:   Luis [name] is a 48 year old male with the above history who is followed at [facility] for his cardiac care. Patient was diagnosed with non-ischemic cardiomyopathy in 2012 in [place]. He states his cardiomyopathy was due to his significant cocaine use a the time ($1000/day of cocaine which he states is about an [place]). He states he had a cardiac catheterization at that time but does not remember what it showed. He does note think he has any significant CAD but notes in Care Everywhere mention previous angioplasty (no PCI) and he is on Plavix at home. Patient reportedly had ventricular tachycardia and ultimately had a ICD placed. He underwent gen change earlier this year. Patient was admitted at [facility] in 04/2018 for chest pain. Echo at that time showed LVEF of 25-30% with global hypokinesis. EF was down from 50-55% in 08/2017. He underwent a nuclear stress test which showed reversibility in the mid apex and partial reversibility in the inferior wall consistent with prior wall infarct and peri-infarct ischemia. Patient was seen by Cardiology who did not feel like any further invasive ischemic evaluation was indicated at the time.He was seen for close follow-up at [place] at which time he was doing well and  denied any further episodes of chest pain. Per note at that time, plan was to "continue with current medical therapy but could consider further invasive ischemic evaluation in the future if patient becomes symptomatic."   Patient presented to the ED yesterday via EMS for further evaluation of chest pain. Patient reports intermittent left sided chest pressure for the past 2 days that radiates to his left arm with some associated shortness of breath, nausea (but no vomiting), and sweating. Pain improves with CPAP machine. Patient states he walks about 1/4 of a mile every day or every other day and notes occasional chest pain. He also notes that he becomes easily fatigued and has more dyspnea with exertion lately. He also report dizziness, blurry vision, and tingling in his neck that radiates to his left arm over the last 2 days. He denies any palpitations or syncope. He reports stable orthopnea. He says he feel like he is "drowning" if he lays flat but this has been going on since 2012. He denies any PND or edema. He denies any weight. No fevers, chills, body aches, or cough. Given symptoms, patient decided to come to the ED for further evaluation.   In the ED, vitals stable. EKG reportedly showed ventricular paced rhythm (unable to personally review at this time). High-sensitivity troponin negative. Chest x-ray showed cardiomegaly and pulmonary venous hypertension without rank edema. BNP normal. WBC 10.6, Hgb 14.2, Plts 258. Na 137, K 3.7, Glucose 110, SCr 0.84. COVID-19 negative.   At the time of this evaluation, patient denies any chest pain but does report continued dizziness, blurry vision, and  tingling in his neck that radiates to his left arm.  Patient has history of cocaine abuse but states he has not used in the past 9 months. He denies any other recreational drug use. Urine drug screen negative this admission.  He reports occasional tobacco use when he is stressed. He denies any alcohol use. Patient  does have a family history of CAD with his maternal cousin dying from a heart attack at the age of 50 and his maternal uncle requiring a CABG x2.   Heart Pathway Score:     Past Medical History:  Diagnosis Date  . Anxiety   . CHF (congestive heart failure) (HCC)   . Diabetes mellitus without complication (HCC)   . GERD (gastroesophageal reflux disease)   . HLD (hyperlipidemia)   . Hypertension   . Pacemaker    defib as well    Past Surgical History:  Procedure Laterality Date  . PACEMAKER PLACEMENT       Home Medications:  Prior to Admission medications   Medication Sig Start Date End Date Taking? Authorizing Provider  clopidogrel (PLAVIX) 75 MG tablet Take 75 mg by mouth daily. 01/13/19  Yes [provider]  divalproex (DEPAKOTE) 500 MG DR tablet Take 500 mg by mouth 2 (two) times a day. 06/30/18  Yes [provider]  furosemide (LASIX) 80 MG tablet Take 80 mg by mouth daily. 09/13/18  Yes [provider]  HUMALOG MIX 75/25 KWIKPEN (75-25) 100 UNIT/ML Kwikpen Inject 60 Units into the skin 3 (three) times daily before meals.  01/13/19  Yes [provider]  metoprolol succinate (TOPROL-XL) 100 MG 24 hr tablet Take 150 mg by mouth every morning.  09/13/18  Yes [provider]  pantoprazole (PROTONIX) 40 MG tablet Take 40 mg by mouth daily. 09/23/18  Yes [provider]  prazosin (MINIPRESS) 2 MG capsule Take 2 mg by mouth at bedtime. 10/28/18  Yes [provider]  rosuvastatin (CRESTOR) 20 MG tablet Take 20 mg by mouth daily. 09/21/18  Yes [provider]  sertraline (ZOLOFT) 50 MG tablet Take 50 mg by mouth 3 (three) times daily.  10/28/18  Yes [provider]  traZODone (DESYREL) 100 MG tablet Take 100 mg by mouth at bedtime. 12/19/18  Yes [provider]  VICTOZA 18 MG/3ML SOPN Inject 1.8 mg into the skin daily before breakfast.  01/09/19  Yes [provider]    Inpatient Medications: Scheduled  Meds: . clopidogrel  75 mg Oral Daily  . divalproex  500 mg Oral BID  . folic acid  1 mg Oral Daily  . furosemide  80 mg Oral Daily  . heparin  5,000 Units Subcutaneous Q8H  . insulin aspart  0-9 Units Subcutaneous TID WC  . insulin aspart protamine- aspart  40 Units Subcutaneous TID AC  . LORazepam  0-4 mg Intravenous Q6H   Followed by  . [START ON 01/20/2019] LORazepam  0-4 mg Intravenous Q12H  . metoprolol succinate  150 mg Oral q morning - 10a  . multivitamin with minerals  1 tablet Oral Daily  . nicotine  21 mg Transdermal Daily  . pantoprazole  40 mg Oral Daily  . prazosin  2 mg Oral QHS  . rosuvastatin  20 mg Oral Daily  . sertraline  50 mg Oral TID  . sodium chloride flush  3 mL Intravenous Once  . sodium chloride flush  3 mL Intravenous Once  . thiamine  100 mg Oral Daily   Or  .  thiamine  100 mg Intravenous Daily  . traZODone  100 mg Oral QHS   Continuous Infusions:  PRN Meds: acetaminophen, hydrALAZINE, LORazepam **OR** LORazepam, morphine injection, nitroGLYCERIN  Allergies:    Allergies  Allergen Reactions  . Aspirin Anaphylaxis    Throat closing   . Iodine-131 Hives    Hives  . Iodinated Diagnostic Agents Hives  . Latex Rash and Hives  . Penicillins Nausea And Vomiting  . Amoxicillin-Pot Clavulanate Diarrhea  . Lisinopril     Other reaction(s): Cough    Social History:   Social History   Tobacco Use  . Smoking status: Current Every Day Smoker    Types: Cigarettes  . Smokeless tobacco: Never Used  Substance Use Topics  . Alcohol use: Yes  . Drug use: Not Currently   Social History   Social History Narrative  . Not on file     Family History:    Family History  Problem Relation Age of Onset  . Hypertension Mother   . Bone cancer Father   . Lung cancer Father      ROS:  Please see the history of present illness.  Review of Systems  Constitutional: Positive for malaise/fatigue. Negative for chills and fever.  HENT: Negative for  congestion.   Eyes: Positive for blurred vision.  Respiratory: Positive for shortness of breath. Negative for cough.   Cardiovascular: Positive for chest pain and orthopnea. Negative for palpitations, leg swelling and PND.  Gastrointestinal: Positive for nausea. Negative for blood in stool and vomiting.  Genitourinary: Negative for hematuria.  Musculoskeletal: Negative for falls and myalgias.  Neurological: Positive for dizziness and tingling. Negative for loss of consciousness.  Endo/Heme/Allergies: Does not bruise/bleed easily.  Psychiatric/Behavioral: Positive for substance abuse. The patient has insomnia.    Physical Exam/Data:   Vitals:   01/17/19 2345 01/18/19 0041 01/18/19 0502 01/18/19 0815  BP: 134/81 130/69 126/61 (!) 105/59  Pulse: 81 73 85 83  Resp: 18 18 20 18   Temp:  97.8 F (36.6 C) 97.9 F (36.6 C) 97.6 F (36.4 C)  TempSrc:  Oral Oral Oral  SpO2: 94% 94% 96% 94%  Weight:  133.5 kg    Height:  6' 5.5" (1.969 m)      Intake/Output Summary (Last 24 hours) at 01/18/2019 0925 Last data filed at 01/18/2019 0631 Gross per 24 hour  Intake -  Output 600 ml  Net -600 ml   Last 3 Weights 01/18/2019  Weight (lbs) 294 lb 6.4 oz  Weight (kg) 133.539 kg     Body mass index is 34.46 kg/m.   Physical Exam per MD:  General:  Well nourished, well developed male resting comfortably in no acute distress.  Moderately obese.  Resting comfortably.  Well-groomed HEENT: [place]/AT/EOMI. Lymph: No adenopathy. Neck: Supple. No JVD.  (However difficult to assess because of body habitus) Endocrine: No thyromegaly. Vascular: No carotid bruits. FA pulses 2+ bilaterally without bruits. Cardiac: RRR. Distinct S1 and S2. No murmur, gallops, or rubs.  Lungs: Clear to auscultation bilaterally. No wheezes, rhonchi, or rales. Abd: Soft, non-distended, and non-tender. Bowel sounds present.  Ext: No edema. Musculoskeletal: No deformities. BUE and BLE strength normal and equal. Skin: Warm and  dry. Neuro:  Alert and oriented x3. No focal deficits. Psych:  Normal affect. Responds appropriately.  EKG:  The EKG was personally reviewed and demonstrates: A sensed V paced rhythm, rate 71 bpm. Telemetry:  Telemetry was personally reviewed and demonstrates: A sensed V paced rhythm  Relevant CV Studies:  From care everywhere   Echocardiogram 05/20/2018: Summary - Technically difficult and limited study. - The left ventricular endocardium was not well visualized. Grossly Moderate to severe global LV hypokinesis. - Ejection fraction is visually estimated at 25-30%. _______________  Eugenie Birks Myoview 05/25/2019: Reportedly showed reversibility in the mid apex and partial reversibility in the inferior wall consistent with prior wall infarct and peri-infarct ischemia.  Laboratory Data:  High Sensitivity Troponin:   Recent Labs  Lab 01/17/19 1817 01/17/19 1950 01/18/19 0109 01/18/19 0317 01/18/19 0437  TROPONINIHS Cardiac EnzymesNo results for input(s): TROPONINI in the last 168 hours. No results for input(s): TROPIPOC in the last 168 hours.  Chemistry Recent Labs  Lab 01/17/19 1817  NA 137  K 3.7  CL 102  CO2 26  GLUCOSE 110*  BUN 10  CREATININE 0.84  CALCIUM 8.8*  GFRNONAA >60  GFRAA >60  ANIONGAP 9    No results for input(s): PROT, ALBUMIN, AST, ALT, ALKPHOS, BILITOT in the last 168 hours. Hematology Recent Labs  Lab 01/17/19 1817  WBC 10.6*  RBC 4.95  HGB 14.2  HCT 45.2  MCV 91.3  MCH 28.7  MCHC 31.4  RDW 13.0  PLT 258   BNP Recent Labs  Lab 01/18/19 0109  BNP 38.5    DDimer No results for input(s): DDIMER in the last 168 hours.   Radiology/Studies:  Dg Chest 2 View  Result Date: 01/17/2019 CLINICAL DATA:  Central chest pain, shortness of breath and dizziness over the last day. EXAM: CHEST - 2 VIEW COMPARISON:  07/18/2018 FINDINGS: The heart is enlarged. Pacemaker/AICD in place. There is pulmonary venous hypertension but no frank  edema. No infiltrate, collapse or effusion. Lateral film is degraded by motion. IMPRESSION: Cardiomegaly. Pulmonary venous hypertension without frank edema. Slight worsening since yesterday. Electronically Signed   By: Paulina Fusi M.D.   On: 01/17/2019 18:50   Ct Head Wo Contrast  Result Date: 01/17/2019 CLINICAL DATA:  Blurred vision and tingling of the left arm and hand beginning yesterday. EXAM: CT HEAD WITHOUT CONTRAST TECHNIQUE: Contiguous axial images were obtained from the base of the skull through the vertex without intravenous contrast. COMPARISON:  None. FINDINGS: Brain: Brain does not show any evidence of old or acute infarction, mass lesion, hemorrhage, hydrocephalus or extra-axial collection. Vascular: No abnormal vascular finding. Skull: Normal Sinuses/Orbits: Clear/normal Other: None IMPRESSION: Normal head CT. Electronically Signed   By: Paulina Fusi M.D.   On: 01/17/2019 19:51    Assessment and Plan:   Chest Pain - Patient present with intermittent left sided chest pressure that radiates to left arm. He is followed at [facility]. Patient denies any significant CAD but some notes in Care Everywhere note previous angioplasty (no PCI) and he is on Plavix at home. Recent stress test at [facility] in 04/2018 showed "reversibility in the mid apex and partial reversibility in the inferior wall consistent with prior wall infarct and peri-infarct ischemia." - High-sensitivity troponin negative at 8 >> 8 >> 9 >> 9 >> 9. - Patient currently chest pain free.  - Patient on Plavix at home. Unclear exactly why. He has anaphylaxis allergy to Aspirin. - Some atypical and typical symptoms. Some pain with exertion but pain also improves with CPAP. He does reports more dyspnea with exertion and well as being easily fatigued. Per Primary Cardiology note from 05/2018 following stress test, "continue with current medical therapy but could consider further invasive ischemic evaluation in the future if  patient  becomes symptomatic." Will discuss further ischemic evaluation with Dr. Herbie BaltimoreHarding. However, patient has ruled out for MI so may be able to follow up with [facility]. --Based on prior evaluation, he has had stress test that were somewhat equivocal.  At this point if any ischemic evaluation is warranted, would suggest coronary CT angiogram which will allow us to fully evaluate his anatomy.  Otherwise relatively atypical symptoms with negative troponins. Could also consider the possibility of spasm -> in which case potentially adding Imdur would be reasonable.  Chronic Systolic CHF/ Non-Ischemic Cardiomyopathy - Patient diagnosed with non-ischemic cardiomyopathy in 2012 in [place]. Patient states it was felt to be due to his significant cocaine use at the time.  - Most recent Echo in 04/2018 [facility] showed LVEF of 25-30% with global hypokinesis.  - BNP normal. - Chest x-ray showed cardiomegaly and pulmonary hypertension but no frank edema. - Continue home PO Lasix 80mg  daily at this time. - Continue home Toprol 150mg  daily. - Patient has allergy to Lisinopril (cough) but does not appear to be on an ARB/ARNI at home. Unclear exactly why - possibly due to borderline hypotension based off BP trends here. Would consider starting ARB but will defer to primary Cardiologist. --He does not remember ever being on an ARB or any reason for not being on it.  Perhaps was related to allergy.  However as an African-American, we can consider isosorbide dinitrate/hydralazine - Continue to monitor volume status closely.   S/P [employer] BiV ICD - Initially placed in [place] but [facility] gen [facility].  -  Last device interrogation on 12/27/2018 showed 5 beat of NSVT and adequate device function.  Hypertension - BP currently well controlled.  - Continue home Toprol as above.  Hyperlipidemia - Lipid panel today: Total Cholesterol 110, Triglycerides 95, HDL 39, LDL 52. -  Continue home Crestor 20mg  daily.  Type 2 Diabetes Mellitus - Poorly Controlled - Hemoglobin A1c 11.2 this admission. - On Insulin at home.  - Management per primary team.   Of note, patient also having dizziness, blurry vision, and tingling is extremities. Head CT on admission was negative. Primary team has consulted Neurology.    Signed, Corrin ParkerCallie E Goodrich, PA-C  01/18/2019 9:25 AM    ATTENDING ATTESTATION  I have seen, examined and evaluated the patient this PM along with Corrin Parkerallie E Goodrich, PA-C .  After reviewing all the available data and chart, we discussed the patients laboratory, study & physical findings as well as symptoms in detail. I agree with her findings, examination as well as impression recommendations as per our discussion.    Attending adjustments noted in italics.  Physical examination was performed directly by me. We both reviewed care everywhere chart and reviewed the patient.  Atypical chest pain that happens at both rest and exertion.  There are some typical features and location and radiation down the arm, but we really do not have a good idea as to his coronary disease history.  In the past.  Apparently had angioplasty, but has had 2 stress tests since March 2019.  I suspect that with his reduced EF, stress test will be read as abnormal regardless.  I do think that if we want to delineate his coronary anatomy with a noninvasive study the best option will be coronary CTA. Coronary CTA ordered. Otherwise he is on decent regiment but not on an ARB.  He is on spironolactone at home according to his  last cardiology note and it is not listed here.  Could probably restart that.  I do not think he has any decompensated heart failure because his BNP is normal, he has no edema.  His chronic orthopnea probably more related to body habitus but no PND.  Seems euvolemic on exam.  Has not had to use any additional Lasix in the last several months.  While he is having his other  neurologic issues of tingling and numbness evaluated, we can go him proceed with checking a coronary CTA.  If this does not show any evidence of significant disease, I would defer adjustments to his long-term medical management to his primary cardiologist.    Bryan Lemmaavid Elzia Hott, M.D., M.S. Interventional Cardiologist   Pager # [phone] Phone # [phone] [address] [place], [place] [address]     For questions or updates, please contact [facility] Please consult [email or web address] for contact info under

## 2019-01-18 NOTE — Progress Notes (Signed)
Spoke with CT department and patient has to  be pre medicated 13 hours prior to scan.  MEDICATE PER ORDERS AT 8PM SO PATIENT SCAN RECEIVE SCAN AT 8AM.

## 2019-01-18 NOTE — Progress Notes (Signed)
   Coronary CTA ordered, however he does not require premedication because of a contrast hypersensitivity.  We will therefore allow him to eat and procedure can be done either later on this evening or tomorrow.   Glenetta Hew, MD

## 2019-01-18 NOTE — Progress Notes (Signed)
Spoke with CT Department twice about CT scan due to emergent cases there is a delay. Patient is poss  next to be seen.

## 2019-01-18 NOTE — Progress Notes (Signed)
Pt was admitted onto 3E07, alert and oriented.  Pt able to slide himself, from transfer stretcher to bed, independently.  Pt stated, that " he was feeling dizzy and had a headache".  Therefore, a bed weight was done upon admission.  Pt denies any chest pain during this time.  No signs of distress noted.

## 2019-01-18 NOTE — Progress Notes (Addendum)
PROGRESS NOTE    Luis [name]  XFG:[id number] DOB: 09-Oct-1970 DOA: 01/17/2019 PCP: System, Pcp Not In   Brief Narrative: Luis [name] is a 48 y.o. male with medical history significant of hypertension, hyperlipidemia, diabetes mellitus, GERD, depression with anxiety, sCHF (EF 25-30% in 04/2018 per EDP), pacemaker placement, polysubstance abuse (tobacco, cocaine, alcohol), CAD. He presented with chest pain and admitted for chest pain rule out. Also with acute headache and resultant left sided numbness.   Assessment & Plan:   Principal Problem:   Chest pain Active Problems:   Chronic systolic CHF (congestive heart failure) (HCC)   Pacemaker   HLD (hyperlipidemia)   Diabetes mellitus without complication (HCC)   Hypertension   GERD (gastroesophageal reflux disease)   Tobacco abuse   Alcohol abuse   Chest pain History suggests atypical nature.  So far, troponin has been negative Patient with significant heart history.  Follows with cardiology in [facility].  Chest pain has resolved this morning.  Currently n.p.o. pending cardiology management/recommendations -Continue nitroglycerin as needed, morphine as needed, Plavix, metoprolol  Chronic systolic heart failure Last EF shows 25 to 30% in 2019.  Currently appears euvolemic.  Patient has an ICD in place. -Continue metoprolol and Lasix  Left-sided arm numbness Headache Headaches seem to contribute to worsening left-sided numbness symptoms.  Possible migraine although patient does not have any history of headache in the past.  Symptoms appear to have started about 2 to 3 days prior. No associated weakness.  No cranial nerve defects. Folate level is decreased -Continue folic acid -Neurology consult -MRI/MRA (confirming AICD is compatible)  Hyperlipidemia -Continue Crestor  Essential hypertension -Continue metoprolol  Diabetes mellitus on insulin Currently NPO. Will resume home regimen once able to take PO  GERD -Continue  Protonix  Tobacco abuse Alcohol abuse Counseled on admission. -Continue CIWA  Anxiety -Continue Trazodone and Zoloft   DVT prophylaxis: Heparin subq Code Status:   Code Status: Full Code Family Communication: None at bedside Disposition Plan: Discharge pending continued cardiology and neurology workup/recommendations   Consultants:   Cardiology  Neurology  Procedures:   None  Antimicrobials:  None    Subjective: Intermittent headache on right temporal area. Improved with Tylenol. Makes his left arm numbness worse.   Objective: Vitals:   01/17/19 2345 01/18/19 0041 01/18/19 0502 01/18/19 0815  BP: 134/81 130/69 126/61 (!) 105/59  Pulse: 81 73 85 83  Resp: 18 18 20 18   Temp:  97.8 F (36.6 C) 97.9 F (36.6 C) 97.6 F (36.4 C)  TempSrc:  Oral Oral Oral  SpO2: 94% 94% 96% 94%  Weight:  133.5 kg    Height:  6' 5.5" (1.969 m)      Intake/Output Summary (Last 24 hours) at 01/18/2019 1043 Last data filed at 01/18/2019 0631 Gross per 24 hour  Intake -  Output 600 ml  Net -600 ml   Filed Weights   01/18/19 0041  Weight: 133.5 kg    Examination:  General exam: Appears calm and comfortable Respiratory system: Clear to auscultation. Respiratory effort normal. Cardiovascular system: S1 & S2 heard, RRR. No murmurs, rubs, gallops or clicks. Gastrointestinal system: Abdomen is nondistended, soft and nontender. No organomegaly or masses felt. Normal bowel sounds heard. Central nervous system: Alert and oriented. No cranial nerve deficits. Left sided numbness compared to left arm from fingers to elbow Extremities: No edema. No calf tenderness Skin: No cyanosis. No rashes Psychiatry: Judgement and insight appear normal. Mood & affect appropriate.     Data Reviewed:  I have personally reviewed following labs and imaging studies  CBC: Recent Labs  Lab 01/17/19 1817  WBC 10.6*  NEUTROABS 6.5  HGB 14.2  HCT 45.2  MCV 91.3  PLT 258   Basic Metabolic  Panel: Recent Labs  Lab 01/17/19 1817  NA 137  K 3.7  CL 102  CO2 26  GLUCOSE 110*  BUN 10  CREATININE 0.84  CALCIUM 8.8*   GFR: Estimated Creatinine Clearance: 160.1 mL/min (by C-G formula based on SCr of 0.84 mg/dL). Liver Function Tests: No results for input(s): AST, ALT, ALKPHOS, BILITOT, PROT, ALBUMIN in the last 168 hours. No results for input(s): LIPASE, AMYLASE in the last 168 hours. No results for input(s): AMMONIA in the last 168 hours. Coagulation Profile: Recent Labs  Lab 01/17/19 1817  INR 0.9   Cardiac Enzymes: No results for input(s): CKTOTAL, CKMB, CKMBINDEX, TROPONINI in the last 168 hours. BNP (last 3 results) No results for input(s): PROBNP in the last 8760 hours. HbA1C: Recent Labs    01/18/19 0109  HGBA1C 11.2*   CBG: Recent Labs  Lab 01/17/19 1952 01/18/19 0113 01/18/19 0629  GLUCAP 101* 113* 117*   Lipid Profile: Recent Labs    01/18/19 0109  CHOL 110  HDL 39*  LDLCALC 52  TRIG 95  CHOLHDL 2.8   Thyroid Function Tests: Recent Labs    01/18/19 0546  TSH 1.179   Anemia Panel: Recent Labs    01/18/19 0546  VITAMINB12 250  FOLATE 5.5*   Sepsis Labs: No results for input(s): PROCALCITON, LATICACIDVEN in the last 168 hours.  Recent Results (from the past 240 hour(s))  SARS Coronavirus 2 (CEPHEID - Performed in [facility] hospital lab), Hosp Order     Status: None   Collection Time: 01/17/19 11:05 PM   Specimen: Nasopharyngeal Swab  Result Value Ref Range Status   SARS Coronavirus 2 NEGATIVE NEGATIVE Final    Comment: (NOTE) If result is NEGATIVE SARS-CoV-2 target nucleic acids are NOT DETECTED. The SARS-CoV-2 RNA is generally detectable in upper and lower  respiratory specimens during the acute phase of infection. The lowest  concentration of SARS-CoV-2 viral copies this assay can detect is 250  copies / mL. A negative result does not preclude SARS-CoV-2 infection  and should not be used as the sole basis for  treatment or other  patient management decisions.  A negative result may occur with  improper specimen collection / handling, submission of specimen other  than nasopharyngeal swab, presence of viral mutation(s) within the  areas targeted by this assay, and inadequate number of viral copies  (<250 copies / mL). A negative result must be combined with clinical  observations, patient history, and epidemiological information. If result is POSITIVE SARS-CoV-2 target nucleic acids are DETECTED. The SARS-CoV-2 RNA is generally detectable in upper and lower  respiratory specimens dur ing the acute phase of infection.  Positive  results are indicative of active infection with SARS-CoV-2.  Clinical  correlation with patient history and other diagnostic information is  necessary to determine patient infection status.  Positive results do  not rule out bacterial infection or co-infection with other viruses. If result is PRESUMPTIVE POSTIVE SARS-CoV-2 nucleic acids MAY BE PRESENT.   A presumptive positive result was obtained on the submitted specimen  and confirmed on repeat testing.  While 2019 novel coronavirus  (SARS-CoV-2) nucleic acids may be present in the submitted sample  additional confirmatory testing may be necessary for epidemiological  and / or clinical management purposes  to differentiate between  SARS-CoV-2 and other Sarbecovirus currently known to infect humans.  If clinically indicated additional testing with an alternate test  methodology [phone]([id number]) is advised. The SARS-CoV-2 RNA is generally  detectable in upper and lower respiratory sp ecimens during the acute  phase of infection. The expected result is Negative. Fact Sheet for Patients:  BoilerBrush.com.cyhttps://www.fda.gov/media/136312/download Fact Sheet for Healthcare Providers: https://pope.com/https://www.fda.gov/media/136313/download This test is not yet approved or cleared by the [place] FDA and has been authorized for detection and/or  diagnosis of SARS-CoV-2 by FDA under an Emergency Use Authorization (EUA).  This EUA will remain in effect (meaning this test can be used) for the duration of the COVID-19 declaration under Section 564(b)(1) of the Act, 21 U.S.C. section 360bbb-3(b)(1), unless the authorization is terminated or revoked sooner. Performed at [facility], [address] [place], [place] [address]          Radiology Studies: Dg Chest 2 View  Result Date: 01/17/2019 CLINICAL DATA:  Central chest pain, shortness of breath and dizziness over the last day. EXAM: CHEST - 2 VIEW COMPARISON:  07/18/2018 FINDINGS: The heart is enlarged. Pacemaker/AICD in place. There is pulmonary venous hypertension but no frank edema. No infiltrate, collapse or effusion. Lateral film is degraded by motion. IMPRESSION: Cardiomegaly. Pulmonary venous hypertension without frank edema. Slight worsening since yesterday. Electronically Signed   By: Paulina FusiMark  Shogry M.D.   On: 01/17/2019 18:50   Ct Head Wo Contrast  Result Date: 01/17/2019 CLINICAL DATA:  Blurred vision and tingling of the left arm and hand beginning yesterday. EXAM: CT HEAD WITHOUT CONTRAST TECHNIQUE: Contiguous axial images were obtained from the base of the skull through the vertex without intravenous contrast. COMPARISON:  None. FINDINGS: Brain: Brain does not show any evidence of old or acute infarction, mass lesion, hemorrhage, hydrocephalus or extra-axial collection. Vascular: No abnormal vascular finding. Skull: Normal Sinuses/Orbits: Clear/normal Other: None IMPRESSION: Normal head CT. Electronically Signed   By: Paulina FusiMark  Shogry M.D.   On: 01/17/2019 19:51        Scheduled Meds: . clopidogrel  75 mg Oral Daily  . divalproex  500 mg Oral BID  . folic acid  1 mg Oral Daily  . furosemide  80 mg Oral Daily  . heparin  5,000 Units Subcutaneous Q8H  . insulin aspart  0-9 Units Subcutaneous TID WC  . insulin aspart protamine- aspart  40 Units Subcutaneous TID AC   . LORazepam  0-4 mg Intravenous Q6H   Followed by  . [START ON 01/20/2019] LORazepam  0-4 mg Intravenous Q12H  . metoprolol succinate  150 mg Oral q morning - 10a  . multivitamin with minerals  1 tablet Oral Daily  . nicotine  21 mg Transdermal Daily  . pantoprazole  40 mg Oral Daily  . prazosin  2 mg Oral QHS  . rosuvastatin  20 mg Oral Daily  . sertraline  50 mg Oral TID  . sodium chloride flush  3 mL Intravenous Once  . sodium chloride flush  3 mL Intravenous Once  . thiamine  100 mg Oral Daily   Or  . thiamine  100 mg Intravenous Daily  . traZODone  100 mg Oral QHS   Continuous Infusions:   LOS: 0 days     Jacquelin Hawkingalph Codee Tutson, MD [facility] 01/18/2019, 10:43 AM  If 7PM-7AM, please contact night-coverage [email or web address]

## 2019-01-19 ENCOUNTER — Inpatient Hospital Stay (HOSPITAL_COMMUNITY): Payer: Medicare Other

## 2019-01-19 DIAGNOSIS — R079 Chest pain, unspecified: Secondary | ICD-10-CM

## 2019-01-19 DIAGNOSIS — K219 Gastro-esophageal reflux disease without esophagitis: Secondary | ICD-10-CM

## 2019-01-19 LAB — GLUCOSE, CAPILLARY
Glucose-Capillary: 239 mg/dL — ABNORMAL HIGH (ref 70–99)
Glucose-Capillary: 286 mg/dL — ABNORMAL HIGH (ref 70–99)
Glucose-Capillary: 277 mg/dL — ABNORMAL HIGH (ref 70–99)
Glucose-Capillary: 221 mg/dL — ABNORMAL HIGH (ref 70–99)

## 2019-01-19 MED ORDER — IOHEXOL 350 MG/ML SOLN
75.0000 mL | Freq: Once | INTRAVENOUS | Status: AC | PRN
  Administered 2019-01-19: 75 mL via INTRAVENOUS

## 2019-01-19 MED ORDER — ACETAMINOPHEN 325 MG PO TABS
325.0000 mg | ORAL_TABLET | Freq: Four times a day (QID) | ORAL | Status: DC
  Administered 2019-01-19 – 2019-01-20 (×2): 325 mg via ORAL
  Filled 2019-01-19 (×3): qty 1

## 2019-01-19 MED ORDER — PROCHLORPERAZINE EDISYLATE 10 MG/2ML IJ SOLN
10.0000 mg | Freq: Four times a day (QID) | INTRAMUSCULAR | Status: DC
  Administered 2019-01-19 – 2019-01-20 (×2): 10 mg via INTRAVENOUS
  Filled 2019-01-19 (×6): qty 2

## 2019-01-19 MED ORDER — METOPROLOL TARTRATE 5 MG/5ML IV SOLN
INTRAVENOUS | Status: AC
  Filled 2019-01-19: qty 10

## 2019-01-19 MED ORDER — NITROGLYCERIN 0.4 MG SL SUBL
SUBLINGUAL_TABLET | SUBLINGUAL | Status: AC
  Administered 2019-01-19: 0.8 mg
  Filled 2019-01-19: qty 2

## 2019-01-19 MED ORDER — IOHEXOL 350 MG/ML SOLN
80.0000 mL | Freq: Once | INTRAVENOUS | Status: AC | PRN
  Administered 2019-01-19: 12:00:00 80 mL via INTRAVENOUS

## 2019-01-19 MED ORDER — DIPHENHYDRAMINE HCL 50 MG/ML IJ SOLN
25.0000 mg | Freq: Four times a day (QID) | INTRAMUSCULAR | Status: DC
  Administered 2019-01-19 – 2019-01-20 (×2): 25 mg via INTRAVENOUS
  Filled 2019-01-19 (×3): qty 1

## 2019-01-19 MED ORDER — METHOCARBAMOL 1000 MG/10ML IJ SOLN
500.0000 mg | Freq: Three times a day (TID) | INTRAVENOUS | Status: DC
  Administered 2019-01-19: 500 mg via INTRAVENOUS
  Filled 2019-01-19 (×4): qty 5
  Filled 2019-01-19: qty 500
  Filled 2019-01-19: qty 5

## 2019-01-19 MED ORDER — METOPROLOL TARTRATE 5 MG/5ML IV SOLN
10.0000 mg | INTRAVENOUS | Status: DC | PRN
  Administered 2019-01-19: 09:00:00 10 mg via INTRAVENOUS

## 2019-01-19 NOTE — Progress Notes (Signed)
This RN called CT to verify patient going at 0800 since benadryl scheduled at 0700.  Patient on their schedule for 0900 so Benadryl and prednisone will be moved to 0800.  Verified with pharmacy.

## 2019-01-19 NOTE — Progress Notes (Addendum)
NEUROLOGY PROGRESS NOTE  Subjective: No significant change at this time other than the fact that his headache now is an 8/10.  He does state that his left leg is no longer numb however his left arm still has decreased sensation.  Continues to have a headache in the occipital region along with neck pain.  Thus far no anti-inflammatories or pain medications have helped.  Still complaining of blurred vision.  Exam: Vitals:   01/19/19 0929 01/19/19 1237  BP:  132/75  Pulse: 80 77  Resp:  20  Temp:  98.2 F (36.8 C)  SpO2:  96%    Physical Exam   HEENT-  Normocephalic, no lesions, without obvious abnormality.  Normal external eye and conjunctiva.   Extremities- Warm, dry and intact Musculoskeletal-no joint tenderness, deformity or swelling Skin-warm and dry, no hyperpigmentation, vitiligo, or suspicious lesions    Neuro:  Mental Status: Alert, oriented, thought content appropriate.  Speech fluent without evidence of aphasia.  Able to follow 3 step commands without difficulty. Cranial Nerves: II:  Visual fields grossly normal,  III,IV, VI: ptosis not present, extra-ocular motions intact bilaterally pupils equal, round, reactive to light and accommodation V,VII: smile symmetric, facial light touch sensation normal bilaterally VIII: hearing normal bilaterally IX,X: Palate rises midline XI: bilateral shoulder shrug XII: midline tongue extension Motor: Right : Upper extremity   5/5    Left:     Upper extremity   5/5  Lower extremity   5/5     Lower extremity   5/5 Tone and bulk:normal tone throughout; no atrophy noted Sensory: Pinprick and light touch intact throughout, bilaterally Deep Tendon Reflexes: 2+ and symmetric throughout Plantars: Right: downgoing   Left: downgoing Cerebellar: normal finger-to-nose, normal rapid alternating movements and normal heel-to-shin test     Medications:  Scheduled: . prochlorperazine  10 mg Intravenous Q6H   And  . diphenhydrAMINE  25 mg  Intravenous Q6H   And  . acetaminophen  325 mg Oral Q6H  . clopidogrel  75 mg Oral Daily  . divalproex  500 mg Oral BID  . folic acid  1 mg Oral Daily  . furosemide  80 mg Oral Daily  . heparin  5,000 Units Subcutaneous Q8H  . insulin aspart  0-9 Units Subcutaneous TID WC  . insulin aspart protamine- aspart  40 Units Subcutaneous TID AC  . LORazepam  0-4 mg Intravenous Q6H   Followed by  . [START ON 01/20/2019] LORazepam  0-4 mg Intravenous Q12H  . metoprolol succinate  150 mg Oral q morning - 10a  . metoprolol tartrate      . multivitamin with minerals  1 tablet Oral Daily  . nicotine  21 mg Transdermal Daily  . pantoprazole  40 mg Oral Daily  . prazosin  2 mg Oral QHS  . rosuvastatin  20 mg Oral Daily  . sertraline  50 mg Oral TID  . sodium chloride flush  3 mL Intravenous Once  . thiamine  100 mg Oral Daily   Or  . thiamine  100 mg Intravenous Daily  . traZODone  100 mg Oral QHS   Continuous: . methocarbamol (ROBAXIN) IV     LHT:[id number], LORazepam **OR** LORazepam, metoprolol tartrate, metoprolol tartrate, morphine injection, nitroGLYCERIN  Pertinent Labs/Diagnostics: Ammonia 55 ESR 2  Ct Angio Head W Or Wo Contrast   IMPRESSION: CTA Head: - No evidence of large vessel occlusion or proximal high-grade arterial stenosis. No intracranial aneurysm is identified. CTA Neck: - The carotid and vertebral arteries  are widely patent within the neck. - Partially retropharyngeal course of the bilateral cervical internal carotid arteries. - Multilevel ventrolateral osteophytes within the imaged upper thoracic spine, findings which may be seen in the setting of diffuse idiopathic skeletal hyperostosis (DISH). Electronically Signed   By: Kellie Simmering   On: 01/19/2019 13:36    Ct Head Wo Contrast  Result Date: 01/17/2019 CLINICAL DATA:  Blurred vision and tingling of the left arm and hand beginning yesterday. EXAM: CT HEAD WITHOUT CONTRAST TECHNIQUE: Contiguous axial images were  obtained from the base of the skull through the vertex without intravenous contrast. COMPARISON:  None. FINDINGS: Brain: Brain does not show any evidence of old or acute infarction, mass lesion, hemorrhage, hydrocephalus or extra-axial collection. Vascular: No abnormal vascular finding. Skull: Normal Sinuses/Orbits: Clear/normal Other: None IMPRESSION: Normal head CT. Electronically Signed   By: Nelson Chimes M.D.   On: 01/17/2019 19:51   Ct Angio Neck W Or Wo Contrast  Result Date: 01/19/2019 CLINICAL DATA: IMPRESSION: CTA Head: - No evidence of large vessel occlusion or proximal high-grade arterial stenosis. No intracranial aneurysm is identified. CTA Neck: - The carotid and vertebral arteries are widely patent within the neck. - Partially retropharyngeal course of the bilateral cervical internal carotid arteries. - Multilevel ventrolateral osteophytes within the imaged upper thoracic spine, findings which may be seen in the setting of diffuse idiopathic skeletal hyperostosis (DISH). Electronically Signed   By: Kellie Simmering   On: 01/19/2019 13:36   Mr Brain Wo Contrast  Result Date: 01/19/2019  IMPRESSION: Mildly motion degraded exam. No evidence of intracranial mass or acute intracranial abnormality. Specifically, no evidence of acute or recent subacute infarction. Electronically Signed   By: Kellie Simmering   On: 01/19/2019 12:44     Result Date: 01/19/2019  IMPRESSION: 1. Coronary calcium not possible to assess secondary to artifact with BiV ICD leads, however no calcifications seen in the visualized portions of coronary arteries. 2. Normal coronary origin with left dominance. 3. This study is of very limited quality with significant artifact from leads of biventricular pacemaker. There is no obvious plaque in the visualized portions of coronary arteries - LM, proximal, mid LAD, proximal and mid portion of LCX artery and PDA. Electronically Signed: By: Ena Dawley On: 01/19/2019 11:37     Etta Quill PA-C [facility] [phone]   Assessment: 48 year old male with history of PTSD on Depakote, congestive heart failure with AICD, hypertension, hyperlipidemia, diabetes, history of cocaine abuse presented with headache x2 days.  Patient was given migraine cocktail yesterday with no significant relief.he also received prednisone.  Today seems to be improved slightly as far as his left leg tingling but continues have blurred vision and headache.  CT angiogram of head and neck returned normal, no evidence of dissection or CVS.  MRI brain negative for acute findings.  We will stay away from VPA load as ammonia level is 55.   Impression:  Tension type headache versus migraine   Recommendations: - Additional migraine cocktail including Benadryl- Robaxin- Compazine  -Start nortriptyline 9m QHS for prophylaxis  01/19/2019, 3:13 PM  NEUROHOSPITALIST ADDENDUM Performed a face to face diagnostic evaluation.   I have reviewed the contents of history and physical exam as documented by PA/ARNP/Resident and agree with above documentation.  I have discussed and formulated the above plan as documented. Edits to the note have been made as needed.   Patient received migraine cocktail yesterday.  Received additional dose of Benadryl Robaxin and Compazine which  seemed to have improved his headache further.  On assessing him around 6 PM patient stated that his headache was only 5/10 and was hoping to go home tomorrow.  Work-up for secondary causes of a headache so far negative and MRI brain normal, no evidence of acute stroke.  CTA negative for dissection, reversible vasoconstriction syndrome.  I suspect the patient likely has chronic migraine versus tension type headache.  I do think he will benefit from nortriptyline nightly as he takes as needed aspirin/Tylenol 1-2 times per week.  Also recommend better sleep hygiene.       Karena Addison Aroor MD [facility] [phone]   If  7pm to 7am, please call on call as listed on AMION.

## 2019-01-19 NOTE — Progress Notes (Signed)
    Patient was not in the room for me to see him.  However I have reviewed the coronary CTA results with Dr. Meda Coffee. Unfortunately, not the best quality study, but the does not appear to be any significant CAD noted.  Therefore would suggest this is noncardiac chest pain.  As previously noted, he has a primary cardiologist at [place].  Is on a pretty stable regimen.  If his blood pressure were to increase, would probably consider adding BiDil since there is potentially a reason for him not being on ACE inhibitor/ARB/Entresto.  Did not seem to be in any acute CHF and was relatively euvolemic yesterday.  Would not make any changes.    [facility] will sign off.   Medication Recommendations: None Other recommendations (labs, testing, etc): None Follow up as an outpatient: With his primary cardiologist with [facility]

## 2019-01-19 NOTE — Progress Notes (Signed)
PROGRESS NOTE    Luis [name]  GYJ:[id number] DOB: 04/29/1971 DOA: 01/17/2019 PCP: System, Pcp Not In   Brief Narrative: Luis [name] is a 48 y.o. male with medical history significant of hypertension, hyperlipidemia, diabetes mellitus, GERD, depression with anxiety, sCHF (EF 25-30% in 04/2018 per EDP), pacemaker placement, polysubstance abuse (tobacco, cocaine, alcohol), CAD. He presented with chest pain and admitted for chest pain rule out. Also with acute headache and resultant left sided numbness.   Assessment & Plan:   Principal Problem:   Chest pain Active Problems:   Chronic systolic CHF (congestive heart failure) (HCC)   Pacemaker   HLD (hyperlipidemia)   Diabetes mellitus without complication (HCC)   Hypertension   GERD (gastroesophageal reflux disease)   Tobacco abuse   Alcohol abuse   Chest pain History suggests atypical nature.  So far, troponin has been negative Patient with significant heart history.  Follows with cardiology in [facility].  Chest pain has resolved this morning.  -Continue nitroglycerin as needed, morphine as needed, Plavix, metoprolol -Cardiology recommendations: signed off  Chronic systolic heart failure Last EF shows 25 to 30% in 2019.  Currently appears euvolemic.  Patient has an ICD in place. -Continue metoprolol and Lasix  Left-sided arm numbness Headache Headaches seem to contribute to worsening left-sided numbness symptoms.  Possible migraine although patient does not have any history of headache in the past.  Symptoms appear to have started about 2 to 3 days prior. No associated weakness.  No cranial nerve defects. Folate level is decreased. CTA head/neck unremarkable for etiology  -Continue folic acid -Neurology recommendations: retry migraine cocktail  Hyperlipidemia -Continue Crestor  Essential hypertension -Continue metoprolol  Diabetes mellitus on insulin -SSI  GERD -Continue Protonix  Tobacco abuse Alcohol  abuse Counseled on admission. -Continue CIWA  Anxiety -Continue Trazodone and Zoloft   DVT prophylaxis: Heparin subq Code Status:   Code Status: Full Code Family Communication: None at bedside Disposition Plan: Discharge likely in AM pending neurology recommendations   Consultants:   Cardiology  Neurology  Procedures:   None  Antimicrobials:  None    Subjective: Left arm numbness is somewhat better. Still with right sided headache.  Objective: Vitals:   01/19/19 0030 01/19/19 0504 01/19/19 0900 01/19/19 0929  BP: 123/64 129/69 132/69   Pulse: 84 82 80 80  Resp: 16 17 18    Temp: 97.8 F (36.6 C) 98.2 F (36.8 C)    TempSrc:  Oral    SpO2: 96% 92%    Weight:  131.1 kg    Height:        Intake/Output Summary (Last 24 hours) at 01/19/2019 1009 Last data filed at 01/19/2019 0906 Gross per 24 hour  Intake 1164 ml  Output 1775 ml  Net -611 ml   Filed Weights   01/18/19 0041 01/19/19 0504  Weight: 133.5 kg 131.1 kg    Examination:  General exam: Appears calm and comfortable Respiratory system: Clear to auscultation. Respiratory effort normal. Cardiovascular system: S1 & S2 heard, RRR. No murmurs, rubs, gallops or clicks. Gastrointestinal system: Abdomen is nondistended, soft and nontender. No organomegaly or masses felt. Normal bowel sounds heard. Central nervous system: Alert and oriented. No focal neurological deficits. Extremities: No edema. No calf tenderness Skin: No cyanosis. No rashes Psychiatry: Judgement and insight appear normal. Mood & affect appropriate.     Data Reviewed: I have personally reviewed following labs and imaging studies  CBC: Recent Labs  Lab 01/17/19 1817  WBC 10.6*  NEUTROABS 6.5  HGB  14.2  HCT 45.2  MCV 91.3  PLT 258   Basic Metabolic Panel: Recent Labs  Lab 01/17/19 1817  NA 137  K 3.7  CL 102  CO2 26  GLUCOSE 110*  BUN 10  CREATININE 0.84  CALCIUM 8.8*   GFR: Estimated Creatinine Clearance: 158.6  mL/min (by C-G formula based on SCr of 0.84 mg/dL). Liver Function Tests: No results for input(s): AST, ALT, ALKPHOS, BILITOT, PROT, ALBUMIN in the last 168 hours. No results for input(s): LIPASE, AMYLASE in the last 168 hours. Recent Labs  Lab 01/18/19 2059  AMMONIA 55*   Coagulation Profile: Recent Labs  Lab 01/17/19 1817  INR 0.9   Cardiac Enzymes: No results for input(s): CKTOTAL, CKMB, CKMBINDEX, TROPONINI in the last 168 hours. BNP (last 3 results) No results for input(s): PROBNP in the last 8760 hours. HbA1C: Recent Labs    01/18/19 0109  HGBA1C 11.2*   CBG: Recent Labs  Lab 01/18/19 0629 01/18/19 1113 01/18/19 1650 01/18/19 2123 01/19/19 0615  GLUCAP 117* 100* 106* 122* 239*   Lipid Profile: Recent Labs    01/18/19 0109  CHOL 110  HDL 39*  LDLCALC 52  TRIG 95  CHOLHDL 2.8   Thyroid Function Tests: Recent Labs    01/18/19 0546  TSH 1.179   Anemia Panel: Recent Labs    01/18/19 0546  VITAMINB12 250  FOLATE 5.5*   Sepsis Labs: No results for input(s): PROCALCITON, LATICACIDVEN in the last 168 hours.  Recent Results (from the past 240 hour(s))  SARS Coronavirus 2 (CEPHEID - Performed in [facility] hospital lab), Hosp Order     Status: None   Collection Time: 01/17/19 11:05 PM   Specimen: Nasopharyngeal Swab  Result Value Ref Range Status   SARS Coronavirus 2 NEGATIVE NEGATIVE Final    Comment: (NOTE) If result is NEGATIVE SARS-CoV-2 target nucleic acids are NOT DETECTED. The SARS-CoV-2 RNA is generally detectable in upper and lower  respiratory specimens during the acute phase of infection. The lowest  concentration of SARS-CoV-2 viral copies this assay can detect is 250  copies / mL. A negative result does not preclude SARS-CoV-2 infection  and should not be used as the sole basis for treatment or other  patient management decisions.  A negative result may occur with  improper specimen collection / handling, submission of specimen  other  than nasopharyngeal swab, presence of viral mutation(s) within the  areas targeted by this assay, and inadequate number of viral copies  (<250 copies / mL). A negative result must be combined with clinical  observations, patient history, and epidemiological information. If result is POSITIVE SARS-CoV-2 target nucleic acids are DETECTED. The SARS-CoV-2 RNA is generally detectable in upper and lower  respiratory specimens dur ing the acute phase of infection.  Positive  results are indicative of active infection with SARS-CoV-2.  Clinical  correlation with patient history and other diagnostic information is  necessary to determine patient infection status.  Positive results do  not rule out bacterial infection or co-infection with other viruses. If result is PRESUMPTIVE POSTIVE SARS-CoV-2 nucleic acids MAY BE PRESENT.   A presumptive positive result was obtained on the submitted specimen  and confirmed on repeat testing.  While 2019 novel coronavirus  (SARS-CoV-2) nucleic acids may be present in the submitted sample  additional confirmatory testing may be necessary for epidemiological  and / or clinical management purposes  to differentiate between  SARS-CoV-2 and other Sarbecovirus currently known to infect humans.  If clinically indicated  additional testing with an alternate test  methodology [phone]([id number]) is advised. The SARS-CoV-2 RNA is generally  detectable in upper and lower respiratory sp ecimens during the acute  phase of infection. The expected result is Negative. Fact Sheet for Patients:  BoilerBrush.com.cyhttps://www.fda.gov/media/136312/download Fact Sheet for Healthcare Providers: https://pope.com/https://www.fda.gov/media/136313/download This test is not yet approved or cleared by the [place] FDA and has been authorized for detection and/or diagnosis of SARS-CoV-2 by FDA under an Emergency Use Authorization (EUA).  This EUA will remain in effect (meaning this test can be used) for the duration  of the COVID-19 declaration under Section 564(b)(1) of the Act, 21 U.S.C. section 360bbb-3(b)(1), unless the authorization is terminated or revoked sooner. Performed at [facility], [address] [place], [place] [address]          Radiology Studies: Dg Chest 2 View  Result Date: 01/17/2019 CLINICAL DATA:  Central chest pain, shortness of breath and dizziness over the last day. EXAM: CHEST - 2 VIEW COMPARISON:  07/18/2018 FINDINGS: The heart is enlarged. Pacemaker/AICD in place. There is pulmonary venous hypertension but no frank edema. No infiltrate, collapse or effusion. Lateral film is degraded by motion. IMPRESSION: Cardiomegaly. Pulmonary venous hypertension without frank edema. Slight worsening since yesterday. Electronically Signed   By: Paulina FusiMark  Shogry M.D.   On: 01/17/2019 18:50   Ct Head Wo Contrast  Result Date: 01/17/2019 CLINICAL DATA:  Blurred vision and tingling of the left arm and hand beginning yesterday. EXAM: CT HEAD WITHOUT CONTRAST TECHNIQUE: Contiguous axial images were obtained from the base of the skull through the vertex without intravenous contrast. COMPARISON:  None. FINDINGS: Brain: Brain does not show any evidence of old or acute infarction, mass lesion, hemorrhage, hydrocephalus or extra-axial collection. Vascular: No abnormal vascular finding. Skull: Normal Sinuses/Orbits: Clear/normal Other: None IMPRESSION: Normal head CT. Electronically Signed   By: Paulina FusiMark  Shogry M.D.   On: 01/17/2019 19:51        Scheduled Meds: . clopidogrel  75 mg Oral Daily  . divalproex  500 mg Oral BID  . folic acid  1 mg Oral Daily  . furosemide  80 mg Oral Daily  . heparin  5,000 Units Subcutaneous Q8H  . insulin aspart  0-9 Units Subcutaneous TID WC  . insulin aspart protamine- aspart  40 Units Subcutaneous TID AC  . LORazepam  0-4 mg Intravenous Q6H   Followed by  . [START ON 01/20/2019] LORazepam  0-4 mg Intravenous Q12H  . metoprolol succinate  150 mg Oral q  morning - 10a  . metoprolol tartrate      . multivitamin with minerals  1 tablet Oral Daily  . nicotine  21 mg Transdermal Daily  . pantoprazole  40 mg Oral Daily  . prazosin  2 mg Oral QHS  . rosuvastatin  20 mg Oral Daily  . sertraline  50 mg Oral TID  . sodium chloride flush  3 mL Intravenous Once  . thiamine  100 mg Oral Daily   Or  . thiamine  100 mg Intravenous Daily  . traZODone  100 mg Oral QHS   Continuous Infusions:   LOS: 1 day     Jacquelin Hawkingalph Daison Braxton, MD [facility] 01/19/2019, 10:09 AM  If 7PM-7AM, please contact night-coverage [email or web address]

## 2019-01-20 DIAGNOSIS — H538 Other visual disturbances: Secondary | ICD-10-CM

## 2019-01-20 DIAGNOSIS — G44209 Tension-type headache, unspecified, not intractable: Secondary | ICD-10-CM

## 2019-01-20 DIAGNOSIS — F1410 Cocaine abuse, uncomplicated: Secondary | ICD-10-CM

## 2019-01-20 LAB — GLUCOSE, CAPILLARY
Glucose-Capillary: 161 mg/dL — ABNORMAL HIGH (ref 70–99)
Glucose-Capillary: 143 mg/dL — ABNORMAL HIGH (ref 70–99)

## 2019-01-20 MED ORDER — FOLIC ACID 1 MG PO TABS: 1.0000 mg | ORAL_TABLET | Freq: Every day | ORAL | 0 refills | Status: DC

## 2019-01-20 NOTE — Progress Notes (Signed)
Patient refused standing weight this AM.  

## 2019-01-20 NOTE — Care Management Important Message (Signed)
Important Message  Patient Details  Name: Luis [name] MRN: [id number] Date of Birth: 1970-11-13   Medicare Important Message Given:  Yes     Shelda Altes 01/20/2019, 11:12 AM

## 2019-01-20 NOTE — Discharge Summary (Signed)
Physician Discharge Summary  Luis [name] WJX:[id number] DOB: 01/01/1971 DOA: 01/17/2019  PCP: System, Pcp Not In  Admit date: 01/17/2019 Discharge date: 01/20/2019  Admitted From: Home Disposition: Home  Recommendations for Outpatient Follow-up:  1. Follow up with PCP in 1 week 2. Please obtain BMP/CBC in one week 3. Please follow up on the following pending results: None  Home Health: None Equipment/Devices: None  Discharge Condition: Stable CODE STATUS: Full code Diet recommendation: Heart healthy   Brief/Interim Summary:  Admission HPI written by Lorretta Harp, MD   Chief Complaint: chest pain  HPI: Luis [name] is a 48 y.o. male with medical history significant of hypertension, hyperlipidemia, diabetes mellitus, GERD, depression with anxiety, sCHF (EF 25-30% in 04/2018 per EDP), pacemaker placement, polysubstance abuse (tobacco, cocaine, alcohol), CAD, who presents with chest pain.  Patient states that his chest pain started yesterday at about noon.  It is located in the center chest, initially 9 out of 10 in severity, currently chest pain is minimal, initially sharp pain, currently pressure-like pain, radiating to the left arm and the left neck, causing left arm tingling.  It is associated with mild shortness breath, but no cough, fever or chills.   Patient also reports intermittent blurry vision, left foot tingling, left arm tingling, bilateral leg weakness, sometimes poor balance, dizziness, headache.  No vision loss or hearing loss.  No difficulty speaking.  Patient has nausea, but no vomiting, diarrhea or abdominal pain.  Denies symptoms of UTI.  ED Course: pt was found to have troponin VIII which is negative, INR 0.9, pending COVID-19 test, electrolytes renal function okay, temperature normal, blood pressure 121/102, heart rate 80, oxygen saturation 92 to 99% on room air, chest x-ray showed cardiomegaly without pulmonary edema or infiltration.  CT head is negative for  acute intracranial abnormalities.  Patient is placed on telemetry bed for observation.   Hospital course:  Chest pain History suggests atypical nature.  Troponin has been negative however, patient with significant heart history.  Follows with cardiology in [facility].  Chest pain has resolved during hospitalization.  Cardiology was consulted and performed a coronary CT which was unrevealing for significant CAD.  Chest pain likely secondary to cocaine use.  Chronic systolic heart failure Last EF shows 25 to 30% in 2019.  Currently appears euvolemic.  Patient has an ICD in place. Continue metoprolol and Lasix.  Left-sided arm numbness Headache Headaches seem to contribute to worsening left-sided numbness symptoms.  Possible migraine although patient does not have any history of headache in the past.  Symptoms appear to have started about 2 to 3 days prior. No associated weakness.  No cranial nerve defects. Folate level is decreased. CTA head/neck unremarkable for etiology. Started on Folic acid. Headache improved with time in addition to two doses of benadryl/robaxin/copmazine combination treatment. Recommendation for nortriptyline on discharge, however, not ordered secondary to concomitant SSRI use.  Hyperlipidemia Continue Crestor  Essential hypertension Continue metoprolol  Diabetes mellitus on insulin SSI while inpatient. Continue home regimen.  GERD Continue Protonix  Tobacco abuse Alcohol abuse Counseled on admission. CIWA while inpatient  Anxiety Continue Trazodone and Zoloft  Discharge Diagnoses:  Principal Problem:   Chest pain Active Problems:   Chronic systolic CHF (congestive heart failure) (HCC)   Pacemaker   HLD (hyperlipidemia)   Diabetes mellitus without complication (HCC)   Hypertension   GERD (gastroesophageal reflux disease)   Tobacco abuse   Alcohol abuse   Cocaine abuse (HCC)   Acute non intractable tension-type  headache   Blurred vision,  bilateral    Discharge Instructions  Discharge Instructions    Call MD for:  persistant dizziness or light-headedness   Complete by: As directed    Call MD for:  persistant nausea and vomiting   Complete by: As directed    Call MD for:  severe uncontrolled pain   Complete by: As directed    Diet - low sodium heart healthy   Complete by: As directed    Increase activity slowly   Complete by: As directed      Allergies as of 01/20/2019      Reactions   Aspirin Anaphylaxis   Throat closing   Iodine-131 Hives   Hives   Iodinated Diagnostic Agents Hives   Latex Rash, Hives   Penicillins Nausea And Vomiting   Amoxicillin-pot Clavulanate Diarrhea   Lisinopril    Other reaction(s): Cough      Medication List    TAKE these medications   clopidogrel 75 MG tablet Commonly known as: PLAVIX Take 75 mg by mouth daily.   divalproex 500 MG DR tablet Commonly known as: DEPAKOTE Take 500 mg by mouth 2 (two) times a day.   folic acid 1 MG tablet Commonly known as: FOLVITE Take 1 tablet (1 mg total) by mouth daily. Start taking on: January 21, 2019   furosemide 80 MG tablet Commonly known as: LASIX Take 80 mg by mouth daily.   HumaLOG Mix 75/25 KwikPen (75-25) 100 UNIT/ML Kwikpen Generic drug: Insulin Lispro Prot & Lispro Inject 60 Units into the skin 3 (three) times daily before meals.   metoprolol succinate 100 MG 24 hr tablet Commonly known as: TOPROL-XL Take 150 mg by mouth every morning.   pantoprazole 40 MG tablet Commonly known as: PROTONIX Take 40 mg by mouth daily.   prazosin 2 MG capsule Commonly known as: MINIPRESS Take 2 mg by mouth at bedtime.   rosuvastatin 20 MG tablet Commonly known as: CRESTOR Take 20 mg by mouth daily.   sertraline 50 MG tablet Commonly known as: ZOLOFT Take 50 mg by mouth 3 (three) times daily.   traZODone 100 MG tablet Commonly known as: DESYREL Take 100 mg by mouth at bedtime.   Victoza 18 MG/3ML Sopn Generic drug:  liraglutide Inject 1.8 mg into the skin daily before breakfast.       Allergies  Allergen Reactions  . Aspirin Anaphylaxis    Throat closing   . Iodine-131 Hives    Hives  . Iodinated Diagnostic Agents Hives  . Latex Rash and Hives  . Penicillins Nausea And Vomiting  . Amoxicillin-Pot Clavulanate Diarrhea  . Lisinopril     Other reaction(s): Cough    Consultations:  Neurology  Cardiology   Procedures/Studies: Ct Angio Head W Or Wo Contrast  Result Date: 01/19/2019 CLINICAL DATA:  Subarachnoid hemorrhage suspected, initial exam. Additional history provided: Bilateral foot shaking, left hand weakness, right parietal headache for 3 days, blurred vision. EXAM: CT ANGIOGRAPHY HEAD AND NECK TECHNIQUE: Multidetector CT imaging of the head and neck was performed using the standard protocol during bolus administration of intravenous contrast. Multiplanar CT image reconstructions and MIPs were obtained to evaluate the vascular anatomy. Carotid stenosis measurements (when applicable) are obtained utilizing NASCET criteria, using the distal internal carotid diameter as the denominator. CONTRAST:  52mL OMNIPAQUE IOHEXOL 350 MG/ML SOLN COMPARISON:  Same day brain MRI 01/19/2019, head CT 01/17/2019 FINDINGS: CT HEAD FINDINGS Brain: Mildly motion degraded exam. The presence of circulating contrast material limits evaluation  for acute intracranial hemorrhage. No large intracranial hemorrhage. No evidence of intracranial mass. No midline shift or extra-axial collection. Partially empty sella turcica. Vascular: Separately reported. Skull: No calvarial fracture. Sinuses: No significant paranasal sinus disease. Orbits: The globes and orbits are unremarkable. Review of the MIP images confirms the above findings CTA NECK FINDINGS Aortic arch: Standard branching. Imaged portion shows no evidence of aneurysm or dissection. No significant stenosis of the major arch vessel origins. Right carotid system: No  evidence of dissection, stenosis (50% or greater) or occlusion. Partially retropharyngeal course of the right internal carotid artery. Left carotid system: No evidence of dissection, stenosis (50% or greater) or occlusion. Partially retropharyngeal course of the left internal carotid artery peak Vertebral arteries: Codominant. No evidence of dissection, stenosis (50% or greater) or occlusion. Skeleton: No suspicious osseous lesion or acute osseous abnormality. No high-grade bony spinal canal stenosis in the imaged cervical or thoracic spine. Multilevel ventrolateral osteophytes within the imaged upper thoracic spine, findings which may be seen in the setting of diffuse idiopathic skeletal hyperostosis (DISH). Other neck: No soft tissue neck mass or pathologically enlarged cervical chain lymph nodes Upper chest: No confluent consolidation within the imaged lung apices. Partially imaged left chest AICD device and leads. Review of the MIP images confirms the above findings CTA HEAD FINDINGS Anterior circulation: No large vessel occlusion or proximal high-grade arterial stenosis. No aneurysm identified Posterior circulation: No large vessel occlusion or proximal high-grade arterial stenosis. No aneurysm identified. Venous sinuses: As permitted by contrast timing, patent. Anatomic variants: Mildly hypoplastic P1 right posterior cerebral artery with sizable right posterior communicating artery. Delayed phase: No enhancing parenchymal lesions within limitations of contrast timing. Review of the MIP images confirms the above findings IMPRESSION: CTA Head: - No evidence of large vessel occlusion or proximal high-grade arterial stenosis. No intracranial aneurysm is identified. CTA Neck: - The carotid and vertebral arteries are widely patent within the neck. - Partially retropharyngeal course of the bilateral cervical internal carotid arteries. - Multilevel ventrolateral osteophytes within the imaged upper thoracic spine,  findings which may be seen in the setting of diffuse idiopathic skeletal hyperostosis (DISH). Electronically Signed   By: Jackey Loge   On: 01/19/2019 13:36   Dg Chest 2 View  Result Date: 01/17/2019 CLINICAL DATA:  Central chest pain, shortness of breath and dizziness over the last day. EXAM: CHEST - 2 VIEW COMPARISON:  07/18/2018 FINDINGS: The heart is enlarged. Pacemaker/AICD in place. There is pulmonary venous hypertension but no frank edema. No infiltrate, collapse or effusion. Lateral film is degraded by motion. IMPRESSION: Cardiomegaly. Pulmonary venous hypertension without frank edema. Slight worsening since yesterday. Electronically Signed   By: Paulina Fusi M.D.   On: 01/17/2019 18:50   Ct Head Wo Contrast  Result Date: 01/17/2019 CLINICAL DATA:  Blurred vision and tingling of the left arm and hand beginning yesterday. EXAM: CT HEAD WITHOUT CONTRAST TECHNIQUE: Contiguous axial images were obtained from the base of the skull through the vertex without intravenous contrast. COMPARISON:  None. FINDINGS: Brain: Brain does not show any evidence of old or acute infarction, mass lesion, hemorrhage, hydrocephalus or extra-axial collection. Vascular: No abnormal vascular finding. Skull: Normal Sinuses/Orbits: Clear/normal Other: None IMPRESSION: Normal head CT. Electronically Signed   By: Paulina Fusi M.D.   On: 01/17/2019 19:51   Ct Angio Neck W Or Wo Contrast  Result Date: 01/19/2019 CLINICAL DATA:  Subarachnoid hemorrhage suspected, initial exam. Additional history provided: Bilateral foot shaking, left hand weakness, right parietal headache for  3 days, blurred vision. EXAM: CT ANGIOGRAPHY HEAD AND NECK TECHNIQUE: Multidetector CT imaging of the head and neck was performed using the standard protocol during bolus administration of intravenous contrast. Multiplanar CT image reconstructions and MIPs were obtained to evaluate the vascular anatomy. Carotid stenosis measurements (when applicable) are  obtained utilizing NASCET criteria, using the distal internal carotid diameter as the denominator. CONTRAST:  75mL OMNIPAQUE IOHEXOL 350 MG/ML SOLN COMPARISON:  Same day brain MRI 01/19/2019, head CT 01/17/2019 FINDINGS: CT HEAD FINDINGS Brain: Mildly motion degraded exam. The presence of circulating contrast material limits evaluation for acute intracranial hemorrhage. No large intracranial hemorrhage. No evidence of intracranial mass. No midline shift or extra-axial collection. Partially empty sella turcica. Vascular: Separately reported. Skull: No calvarial fracture. Sinuses: No significant paranasal sinus disease. Orbits: The globes and orbits are unremarkable. Review of the MIP images confirms the above findings CTA NECK FINDINGS Aortic arch: Standard branching. Imaged portion shows no evidence of aneurysm or dissection. No significant stenosis of the major arch vessel origins. Right carotid system: No evidence of dissection, stenosis (50% or greater) or occlusion. Partially retropharyngeal course of the right internal carotid artery. Left carotid system: No evidence of dissection, stenosis (50% or greater) or occlusion. Partially retropharyngeal course of the left internal carotid artery peak Vertebral arteries: Codominant. No evidence of dissection, stenosis (50% or greater) or occlusion. Skeleton: No suspicious osseous lesion or acute osseous abnormality. No high-grade bony spinal canal stenosis in the imaged cervical or thoracic spine. Multilevel ventrolateral osteophytes within the imaged upper thoracic spine, findings which may be seen in the setting of diffuse idiopathic skeletal hyperostosis (DISH). Other neck: No soft tissue neck mass or pathologically enlarged cervical chain lymph nodes Upper chest: No confluent consolidation within the imaged lung apices. Partially imaged left chest AICD device and leads. Review of the MIP images confirms the above findings CTA HEAD FINDINGS Anterior circulation: No  large vessel occlusion or proximal high-grade arterial stenosis. No aneurysm identified Posterior circulation: No large vessel occlusion or proximal high-grade arterial stenosis. No aneurysm identified. Venous sinuses: As permitted by contrast timing, patent. Anatomic variants: Mildly hypoplastic P1 right posterior cerebral artery with sizable right posterior communicating artery. Delayed phase: No enhancing parenchymal lesions within limitations of contrast timing. Review of the MIP images confirms the above findings IMPRESSION: CTA Head: - No evidence of large vessel occlusion or proximal high-grade arterial stenosis. No intracranial aneurysm is identified. CTA Neck: - The carotid and vertebral arteries are widely patent within the neck. - Partially retropharyngeal course of the bilateral cervical internal carotid arteries. - Multilevel ventrolateral osteophytes within the imaged upper thoracic spine, findings which may be seen in the setting of diffuse idiopathic skeletal hyperostosis (DISH). Electronically Signed   By: Jackey LogeKyle  Golden   On: 01/19/2019 13:36   Mr Brain Wo Contrast  Result Date: 01/19/2019 CLINICAL DATA:  Headache, acute, normal neuro exam. Additional history: Headache, blurred vision, neck pain, tingling sensation on left face, neck, arm and leg. EXAM: MRI HEAD WITHOUT CONTRAST TECHNIQUE: Multiplanar, multiecho pulse sequences of the brain and surrounding structures were obtained without intravenous contrast. COMPARISON:  Same-day CTA head/neck 01/19/2019, head CT 01/17/2019 FINDINGS: Brain: Mildly motion degraded examination. No restricted diffusion is demonstrated to suggest acute or recent subacute infarction. No evidence of intracranial mass. No intracranial hemorrhage, midline shift or extra-axial collection. No focal parenchymal signal abnormality. Incidentally noted cavum septum pellucidum. Asymmetric fluid signal within the right petrous apex may reflect a small amount of trapped fluid.  Vascular:  Flow voids are present within the proximal large vessels. Predominantly fetal origin of the right posterior cerebral artery. Skull and upper cervical spine: Normal marrow signal. Sinuses/Orbits: The globes and orbits demonstrate no acute abnormality. No significant paranasal sinus disease or mastoid effusion. IMPRESSION: Mildly motion degraded exam. No evidence of intracranial mass or acute intracranial abnormality. Specifically, no evidence of acute or recent subacute infarction. Electronically Signed   By: Jackey LogeKyle  Golden   On: 01/19/2019 12:44   Ct Coronary Morph W/cta Cor Teressa SenterW/score W/ca W/cm &/or Wo/cm  Addendum Date: 01/19/2019   ADDENDUM REPORT: 01/19/2019 12:38 EXAM: OVER-READ INTERPRETATION  CT CHEST The following report is an over-read performed by radiologist Dr. Deberah PeltonJohn Stahlof [facility] on 01/19/2019. This over-read does not include interpretation of cardiac or coronary anatomy or pathology. The coronary calcium score/coronary CTA interpretation by the cardiologist is attached. COMPARISON:  None. FINDINGS: The visualized portions of the lower lung fields show no suspicious nodules, masses, or infiltrates. The visualized portions of the mediastinum and chest wall are unremarkable. IMPRESSION: No significant non-cardiovascular abnormality seen in visualized portion of the thorax. Electronically Signed   By: Danae OrleansJohn A Stahl M.D.   On: 01/19/2019 12:38   Result Date: 01/19/2019 CLINICAL DATA:  48 year old male with chronic systolic CHF, s/p BiV-ICD placement. EXAM: Cardiac/Coronary  CTA TECHNIQUE: The patient was scanned on a [employer] scanner. FINDINGS: A 100 kV prospective scan was triggered in the descending thoracic aorta at 111 HU's. Axial non-contrast 3 mm slices were carried out through the heart. The data set was analyzed on a dedicated work station and scored using the Agatson method. Gantry rotation speed was 250 msecs and collimation was .6 mm. No beta blockade and 0.8 mg  of sl NTG was given. The 3D data set was reconstructed in 5% intervals of the 67-82 % of the R-R cycle. Diastolic phases were analyzed on a dedicated work station using MPR, MIP and VRT modes. The patient received 80 cc of contrast. Aorta:  Normal size.  No calcifications.  No dissection. Aortic Valve:  Trileaflet.  No calcifications. Coronary Arteries:  Normal coronary origin. Left dominance. Left main is a large artery that gives rise to LAD and LCX arteries. Left main has no plaque. LAD is a large vessel that gives rise to one large diagonal artery. There is no plaque. LCX is a non-dominant artery that gives rise to PDA. There is no obvious plaque. RCA is a small non- dominant artery. Other findings: Normal pulmonary vein drainage into the left atrium. Normal left atrial appendage without a thrombus. Normal size of the pulmonary artery. IMPRESSION: 1. Coronary calcium not possible to assess secondary to artifact with BiV ICD leads, however no calcifications seen in the visualized portions of coronary arteries. 2. Normal coronary origin with left dominance. 3. This study is of very limited quality with significant artifact from leads of biventricular pacemaker. There is no obvious plaque in the visualized portions of coronary arteries - LM, proximal, mid LAD, proximal and mid portion of LCX artery and PDA. Electronically Signed: By: Tobias AlexanderKatarina  Nelson On: 01/19/2019 11:37      Subjective: No headache or chest pain today.  Discharge Exam: Vitals:   01/19/19 2049 01/20/19 0608  BP:  (!) 113/50  Pulse: 79 72  Resp: 18 18  Temp: 97.6 F (36.4 C) 98 F (36.7 C)  SpO2: 92% 94%   Vitals:   01/19/19 1853 01/19/19 2049 01/20/19 0608 01/20/19 0911  BP: (!) 101/48 127/70 (!) 113/50  Pulse: 76 79 72   Resp:  18 18   Temp:  97.6 F (36.4 C) 98 F (36.7 C)   TempSrc:  Oral Oral   SpO2:  92% 94%   Weight:    132.5 kg  Height:        General: Pt is alert, awake, not in acute  distress Cardiovascular: RRR, S1/S2 +, no rubs, no gallops Respiratory: CTA bilaterally, no wheezing, no rhonchi Abdominal: Soft, NT, ND, bowel sounds + Extremities: no edema, no cyanosis    The results of significant diagnostics from this hospitalization (including imaging, microbiology, ancillary and laboratory) are listed below for reference.     Microbiology: Recent Results (from the past 240 hour(s))  SARS Coronavirus 2 (CEPHEID - Performed in [facility] lab), Hosp Order     Status: None   Collection Time: 01/17/19 11:05 PM   Specimen: Nasopharyngeal Swab  Result Value Ref Range Status   SARS Coronavirus 2 NEGATIVE NEGATIVE Final    Comment: (NOTE) If result is NEGATIVE SARS-CoV-2 target nucleic acids are NOT DETECTED. The SARS-CoV-2 RNA is generally detectable in upper and lower  respiratory specimens during the acute phase of infection. The lowest  concentration of SARS-CoV-2 viral copies this assay can detect is 250  copies / mL. A negative result does not preclude SARS-CoV-2 infection  and should not be used as the sole basis for treatment or other  patient management decisions.  A negative result may occur with  improper specimen collection / handling, submission of specimen other  than nasopharyngeal swab, presence of viral mutation(s) within the  areas targeted by this assay, and inadequate number of viral copies  (<250 copies / mL). A negative result must be combined with clinical  observations, patient history, and epidemiological information. If result is POSITIVE SARS-CoV-2 target nucleic acids are DETECTED. The SARS-CoV-2 RNA is generally detectable in upper and lower  respiratory specimens dur ing the acute phase of infection.  Positive  results are indicative of active infection with SARS-CoV-2.  Clinical  correlation with patient history and other diagnostic information is  necessary to determine patient infection status.  Positive results do  not  rule out bacterial infection or co-infection with other viruses. If result is PRESUMPTIVE POSTIVE SARS-CoV-2 nucleic acids MAY BE PRESENT.   A presumptive positive result was obtained on the submitted specimen  and confirmed on repeat testing.  While 2019 novel coronavirus  (SARS-CoV-2) nucleic acids may be present in the submitted sample  additional confirmatory testing may be necessary for epidemiological  and / or clinical management purposes  to differentiate between  SARS-CoV-2 and other Sarbecovirus currently known to infect humans.  If clinically indicated additional testing with an alternate test  methodology [id number]) is advised. The SARS-CoV-2 RNA is generally  detectable in upper and lower respiratory sp ecimens during the acute  phase of infection. The expected result is Negative. Fact Sheet for Patients:  BoilerBrush.com.cyhttps://www.fda.gov/media/136312/download Fact Sheet for Healthcare Providers: https://pope.com/https://www.fda.gov/media/136313/download This test is not yet approved or cleared by the [place] FDA and has been authorized for detection and/or diagnosis of SARS-CoV-2 by FDA under an Emergency Use Authorization (EUA).  This EUA will remain in effect (meaning this test can be used) for the duration of the COVID-19 declaration under Section 564(b)(1) of the Act, 21 U.S.C. section 360bbb-3(b)(1), unless the authorization is terminated or revoked sooner. Performed at [facility], [address] [place], [place] [address]      Labs: BNP (last  3 results) Recent Labs    01/18/19 0109  BNP 38.5   Basic Metabolic Panel: Recent Labs  Lab 01/17/19 1817  NA 137  K 3.7  CL 102  CO2 26  GLUCOSE 110*  BUN 10  CREATININE 0.84  CALCIUM 8.8*   Liver Function Tests: No results for input(s): AST, ALT, ALKPHOS, BILITOT, PROT, ALBUMIN in the last 168 hours. No results for input(s): LIPASE, AMYLASE in the last 168 hours. Recent Labs  Lab 01/18/19 2059  AMMONIA 55*    CBC: Recent Labs  Lab 01/17/19 1817  WBC 10.6*  NEUTROABS 6.5  HGB 14.2  HCT 45.2  MCV 91.3  PLT 258   Cardiac Enzymes: No results for input(s): CKTOTAL, CKMB, CKMBINDEX, TROPONINI in the last 168 hours. BNP: Invalid input(s): POCBNP CBG: Recent Labs  Lab 01/19/19 1235 01/19/19 1624 01/19/19 2141 01/20/19 0622 01/20/19 1105  GLUCAP 286* 277* 221* 161* 143*   D-Dimer No results for input(s): DDIMER in the last 72 hours. Hgb A1c Recent Labs    01/18/19 0109  HGBA1C 11.2*   Lipid Profile Recent Labs    01/18/19 0109  CHOL 110  HDL 39*  LDLCALC 52  TRIG 95  CHOLHDL 2.8   Thyroid function studies Recent Labs    01/18/19 0546  TSH 1.179   Anemia work up Recent Labs    01/18/19 0546  VITAMINB12 250  FOLATE 5.5*   Urinalysis No results found for: COLORURINE, APPEARANCEUR, LABSPEC, PHURINE, GLUCOSEU, HGBUR, BILIRUBINUR, KETONESUR, PROTEINUR, UROBILINOGEN, NITRITE, LEUKOCYTESUR Sepsis Labs Invalid input(s): PROCALCITONIN,  WBC,  LACTICIDVEN Microbiology Recent Results (from the past 240 hour(s))  SARS Coronavirus 2 (CEPHEID - Performed in [facility] hospital lab), Hosp Order     Status: None   Collection Time: 01/17/19 11:05 PM   Specimen: Nasopharyngeal Swab  Result Value Ref Range Status   SARS Coronavirus 2 NEGATIVE NEGATIVE Final    Comment: (NOTE) If result is NEGATIVE SARS-CoV-2 target nucleic acids are NOT DETECTED. The SARS-CoV-2 RNA is generally detectable in upper and lower  respiratory specimens during the acute phase of infection. The lowest  concentration of SARS-CoV-2 viral copies this assay can detect is 250  copies / mL. A negative result does not preclude SARS-CoV-2 infection  and should not be used as the sole basis for treatment or other  patient management decisions.  A negative result may occur with  improper specimen collection / handling, submission of specimen other  than nasopharyngeal swab, presence of viral mutation(s)  within the  areas targeted by this assay, and inadequate number of viral copies  (<250 copies / mL). A negative result must be combined with clinical  observations, patient history, and epidemiological information. If result is POSITIVE SARS-CoV-2 target nucleic acids are DETECTED. The SARS-CoV-2 RNA is generally detectable in upper and lower  respiratory specimens dur ing the acute phase of infection.  Positive  results are indicative of active infection with SARS-CoV-2.  Clinical  correlation with patient history and other diagnostic information is  necessary to determine patient infection status.  Positive results do  not rule out bacterial infection or co-infection with other viruses. If result is PRESUMPTIVE POSTIVE SARS-CoV-2 nucleic acids MAY BE PRESENT.   A presumptive positive result was obtained on the submitted specimen  and confirmed on repeat testing.  While 2019 novel coronavirus  (SARS-CoV-2) nucleic acids may be present in the submitted sample  additional confirmatory testing may be necessary for epidemiological  and / or clinical management purposes  to differentiate between  SARS-CoV-2 and other Sarbecovirus currently known to infect humans.  If clinically indicated additional testing with an alternate test  methodology [phone]) is advised. The SARS-CoV-2 RNA is generally  detectable in upper and lower respiratory sp ecimens during the acute  phase of infection. The expected result is Negative. Fact Sheet for Patients:  BoilerBrush.com.cy Fact Sheet for Healthcare Providers: https://pope.com/ This test is not yet approved or cleared by the [place] FDA and has been authorized for detection and/or diagnosis of SARS-CoV-2 by FDA under an Emergency Use Authorization (EUA).  This EUA will remain in effect (meaning this test can be used) for the duration of the COVID-19 declaration under Section 564(b)(1) of the Act,  21 U.S.C. section 360bbb-3(b)(1), unless the authorization is terminated or revoked sooner. Performed at [facility], [address] [place], [place] [address]      Time coordinating discharge: Over 30 minutes  SIGNED:   Jacquelin Hawking, MD [facility] 01/20/2019, 12:50 PM

## 2019-01-20 NOTE — Discharge Instructions (Signed)
Loney Laurence,  You were in the hospital because of chest pain in addition to development of headache associated numbness and blurry vision.  He had an extensive work-up which revealed that this is likely related to a heart attack or stroke.  Most likely reason for the symptoms is her cocaine use.  Recommendation is to cease cocaine use.  Symptoms should improve over the next few days.  Please follow-up with your primary care physician in 1 week.

## 2019-01-20 NOTE — Progress Notes (Signed)
NEUROLOGY PROGRESS NOTE  Subjective: States that h/a has now resolved "0/10". Still complaining of blurred vision, but this too has improved per pt report  Exam: Vitals:   01/19/19 2049 01/20/19 0608  BP:  (!) 113/50  Pulse: 79 72  Resp: 18 18  Temp: 97.6 F (36.4 C) 98 F (36.7 C)  SpO2: 92% 94%    Physical Exam   HEENT-  Normocephalic, no lesions, without obvious abnormality.  Normal external eye and conjunctiva.   Extremities- Warm, dry and intact Musculoskeletal-no joint tenderness, deformity or swelling Skin-warm and dry, no hyperpigmentation, vitiligo, or suspicious lesions    Neuro:  Mental Status: Alert, oriented, thought content appropriate.  Speech fluent without evidence of aphasia.  Able to follow 3 step commands without difficulty. Cranial Nerves: II:  Visual fields grossly normal,  III,IV, VI: ptosis not present, extra-ocular motions intact bilaterally pupils equal, round, reactive to light and accommodation. Denies diplopia V,VII: smile symmetric, facial light touch sensation normal bilaterally VIII: hearing normal bilaterally IX,X: Palate rises midline XI: bilateral shoulder shrug XII: midline tongue extension Motor: Right : Upper extremity   5/5    Left:     Upper extremity   5/5  Lower extremity   5/5     Lower extremity   5/5 Tone and bulk:normal tone throughout; no atrophy noted Sensory: Pinprick and light touch intact throughout, bilaterally Deep Tendon Reflexes: 2+ and symmetric throughout Plantars: Right: downgoing   Left: downgoing Cerebellar: normal finger-to-nose, normal rapid alternating movements and normal heel-to-shin test     Medications:  Scheduled: . prochlorperazine  10 mg Intravenous Q6H   And  . diphenhydrAMINE  25 mg Intravenous Q6H   And  . acetaminophen  325 mg Oral Q6H  . clopidogrel  75 mg Oral Daily  . divalproex  500 mg Oral BID  . folic acid  1 mg Oral Daily  . furosemide  80 mg Oral Daily  . heparin  5,000 Units  Subcutaneous Q8H  . insulin aspart  0-9 Units Subcutaneous TID WC  . insulin aspart protamine- aspart  40 Units Subcutaneous TID AC  . LORazepam  0-4 mg Intravenous Q12H  . metoprolol succinate  150 mg Oral q morning - 10a  . multivitamin with minerals  1 tablet Oral Daily  . nicotine  21 mg Transdermal Daily  . pantoprazole  40 mg Oral Daily  . prazosin  2 mg Oral QHS  . rosuvastatin  20 mg Oral Daily  . sertraline  50 mg Oral TID  . sodium chloride flush  3 mL Intravenous Once  . thiamine  100 mg Oral Daily   Or  . thiamine  100 mg Intravenous Daily  . traZODone  100 mg Oral QHS   Continuous: . methocarbamol (ROBAXIN) IV 500 mg (01/19/19 1649)   IWL:NLGXQJJHERD, LORazepam **OR** LORazepam, metoprolol tartrate, metoprolol tartrate, morphine injection, nitroGLYCERIN  Pertinent Labs/Diagnostics: Ammonia 55 ESR 2  Ct Angio Head W Or Wo Contrast   IMPRESSION: CTA Head: - No evidence of large vessel occlusion or proximal high-grade arterial stenosis. No intracranial aneurysm is identified. CTA Neck: - The carotid and vertebral arteries are widely patent within the neck. - Partially retropharyngeal course of the bilateral cervical internal carotid arteries. - Multilevel ventrolateral osteophytes within the imaged upper thoracic spine, findings which may be seen in the setting of diffuse idiopathic skeletal hyperostosis (DISH). Electronically Signed   By: Kellie Simmering   On: 01/19/2019 13:36    Ct Head Wo Contrast  Result Date: 01/17/2019 CLINICAL DATA:  Blurred vision and tingling of the left arm and hand beginning yesterday. EXAM: CT HEAD WITHOUT CONTRAST TECHNIQUE: Contiguous axial images were obtained from the base of the skull through the vertex without intravenous contrast. COMPARISON:  None. FINDINGS: Brain: Brain does not show any evidence of old or acute infarction, mass lesion, hemorrhage, hydrocephalus or extra-axial collection. Vascular: No abnormal vascular finding. Skull:  Normal Sinuses/Orbits: Clear/normal Other: None IMPRESSION: Normal head CT. Electronically Signed   By: Nelson Chimes M.D.   On: 01/17/2019 19:51   Ct Angio Neck W Or Wo Contrast  Result Date: 01/19/2019 CLINICAL DATA: IMPRESSION: CTA Head: - No evidence of large vessel occlusion or proximal high-grade arterial stenosis. No intracranial aneurysm is identified. CTA Neck: - The carotid and vertebral arteries are widely patent within the neck. - Partially retropharyngeal course of the bilateral cervical internal carotid arteries. - Multilevel ventrolateral osteophytes within the imaged upper thoracic spine, findings which may be seen in the setting of diffuse idiopathic skeletal hyperostosis (DISH). Electronically Signed   By: Kellie Simmering   On: 01/19/2019 13:36   Mr Brain Wo Contrast  Result Date: 01/19/2019  IMPRESSION: Mildly motion degraded exam. No evidence of intracranial mass or acute intracranial abnormality. Specifically, no evidence of acute or recent subacute infarction. Electronically Signed   By: Kellie Simmering   On: 01/19/2019 12:44     Result Date: 01/19/2019  IMPRESSION: 1. Coronary calcium not possible to assess secondary to artifact with BiV ICD leads, however no calcifications seen in the visualized portions of coronary arteries. 2. Normal coronary origin with left dominance. 3. This study is of very limited quality with significant artifact from leads of biventricular pacemaker. There is no obvious plaque in the visualized portions of coronary arteries - LM, proximal, mid LAD, proximal and mid portion of LCX artery and PDA. Electronically Signed: By: Ena Dawley On: 01/19/2019 11:37    Assessment: 48 year old male with history of PTSD on Depakote, congestive heart failure with AICD, hypertension, hyperlipidemia, diabetes, history of cocaine abuse presented with headache x2 days.  Patient was given migraine cocktail + prdnisone initially with no significant relief. Today h/a has  resolved with the additional migraine cocktail and simply time. CT angiogram of head and neck returned normal, no evidence of dissection or CVS.  MRI brain negative for acute findings.    Impression:  # Headache with transient neurologic changes- now resolved. Ddx: Tension type headache versus migraine- vascular type h/a secondary to cocaine abuse.  # blurred vision- d/t above; improving  Recommendations: Counseled on quitting cocaine Counseled on h/a mgt Continue nortriptyline 52m QHS for prophylaxis for d/c Rx We will s/o at this time. Please call back if needed.   01/20/2019, 11:15 AM Canden Cieslinski Metzger-Cihelka, ARNP-C, ANVP-BC Pager: [phone]  If 7pm to 7am, please call on call as listed on AMION.

## 2019-01-22 DIAGNOSIS — R519 Headache, unspecified: Secondary | ICD-10-CM

## 2019-02-28 ENCOUNTER — Encounter (HOSPITAL_COMMUNITY): Payer: Self-pay | Admitting: Emergency Medicine

## 2019-02-28 ENCOUNTER — Emergency Department (HOSPITAL_COMMUNITY)
Admission: EM | Admit: 2019-02-28 | Discharge: 2019-02-28 | Disposition: A | Discharge status: Home / Self Care | Payer: Medicare Other | Attending: Emergency Medicine | Admitting: Emergency Medicine

## 2019-02-28 ENCOUNTER — Other Ambulatory Visit: Payer: Self-pay

## 2019-02-28 DIAGNOSIS — I110 Hypertensive heart disease with heart failure: Secondary | ICD-10-CM | POA: Diagnosis not present

## 2019-02-28 DIAGNOSIS — F17210 Nicotine dependence, cigarettes, uncomplicated: Secondary | ICD-10-CM | POA: Insufficient documentation

## 2019-02-28 DIAGNOSIS — E119 Type 2 diabetes mellitus without complications: Secondary | ICD-10-CM | POA: Diagnosis not present

## 2019-02-28 DIAGNOSIS — I5022 Chronic systolic (congestive) heart failure: Secondary | ICD-10-CM | POA: Insufficient documentation

## 2019-02-28 DIAGNOSIS — Z7984 Long term (current) use of oral hypoglycemic drugs: Secondary | ICD-10-CM | POA: Insufficient documentation

## 2019-02-28 DIAGNOSIS — Z950 Presence of cardiac pacemaker: Secondary | ICD-10-CM | POA: Insufficient documentation

## 2019-02-28 DIAGNOSIS — Z79899 Other long term (current) drug therapy: Secondary | ICD-10-CM | POA: Diagnosis not present

## 2019-02-28 DIAGNOSIS — H9203 Otalgia, bilateral: Secondary | ICD-10-CM | POA: Diagnosis present

## 2019-02-28 DIAGNOSIS — R070 Pain in throat: Secondary | ICD-10-CM | POA: Insufficient documentation

## 2019-02-28 DIAGNOSIS — Z91040 Latex allergy status: Secondary | ICD-10-CM | POA: Diagnosis not present

## 2019-02-28 DIAGNOSIS — H6991 Unspecified Eustachian tube disorder, right ear: Secondary | ICD-10-CM

## 2019-02-28 MED ORDER — FLUTICASONE PROPIONATE 50 MCG/ACT NA SUSP: 1.0000 | Freq: Every day | NASAL | 2 refills | Status: DC

## 2019-02-28 MED ORDER — ACETAMINOPHEN 325 MG PO TABS
650.0000 mg | ORAL_TABLET | Freq: Once | ORAL | Status: AC
  Administered 2019-02-28: 650 mg via ORAL
  Filled 2019-02-28: qty 2

## 2019-02-28 NOTE — ED Provider Notes (Signed)
[facility] DEPT Provider Note   CSN: [id number] Arrival date & time: 02/28/19  1109     History   Chief Complaint Chief Complaint  Patient presents with  . Facial Swelling  . Sore Throat  . Blurred Vision    HPI Luis [name] is a 48 y.o. male.     48 year old male [facility] from [facility] house with complaint of bilateral ear pain, sore throat.  Patient reports symptom onset yesterday, felt for something crawling around in his ears.  Denies any other complaints or concerns.     Past Medical History:  Diagnosis Date  . Anxiety   . CHF (congestive heart failure) ([place])   . Diabetes mellitus without complication ([place])   . GERD (gastroesophageal reflux disease)   . HLD (hyperlipidemia)   . Hypertension   . Pacemaker    defib as well  . PTSD (post-traumatic stress disorder)    on Depakote    Patient Active Problem List   Diagnosis Date Noted  . Acute intractable headache   . Cocaine abuse ([place])   . Acute non intractable tension-type headache   . Blurred vision, bilateral   . Chronic systolic CHF (congestive heart failure) ([place]) 01/17/2019  . Pacemaker   . HLD (hyperlipidemia)   . Diabetes mellitus without complication ([place])   . Hypertension   . GERD (gastroesophageal reflux disease)   . Chest pain   . Tobacco abuse   . Alcohol abuse     Past Surgical History:  Procedure Laterality Date  . PACEMAKER PLACEMENT          Home Medications    Prior to Admission medications   Medication Sig Start Date End Date Taking? Authorizing Provider  clopidogrel (PLAVIX) 75 MG tablet Take 75 mg by mouth daily. 01/13/19   [provider]  divalproex (DEPAKOTE) 500 MG DR tablet Take 500 mg by mouth 2 (two) times a day. 06/30/18   [provider]  fluticasone (FLONASE) 50 MCG/ACT nasal spray Place 1 spray into both nostrils daily. 02/28/19   Tacy Learn, PA-C  folic acid (FOLVITE) 1 MG tablet Take 1 tablet (1 mg total)  by mouth daily. 01/21/19   Mariel Aloe, MD  furosemide (LASIX) 80 MG tablet Take 80 mg by mouth daily. 09/13/18   [provider]  HUMALOG MIX 75/25 KWIKPEN (75-25) 100 UNIT/ML Kwikpen Inject 60 Units into the skin 3 (three) times daily before meals.  01/13/19   [provider]  metoprolol succinate (TOPROL-XL) 100 MG 24 hr tablet Take 150 mg by mouth every morning.  09/13/18   [provider]  pantoprazole (PROTONIX) 40 MG tablet Take 40 mg by mouth daily. 09/23/18   [provider]  prazosin (MINIPRESS) 2 MG capsule Take 2 mg by mouth at bedtime. 10/28/18   [provider]  rosuvastatin (CRESTOR) 20 MG tablet Take 20 mg by mouth daily. 09/21/18   [provider]  sertraline (ZOLOFT) 50 MG tablet Take 50 mg by mouth 3 (three) times daily.  10/28/18   [provider]  traZODone (DESYREL) 100 MG tablet Take 100 mg by mouth at bedtime. 12/19/18   [provider]  VICTOZA 18 MG/3ML SOPN Inject 1.8 mg into the skin daily before breakfast.  01/09/19   [provider]    Family History Family History  Problem Relation Age of Onset  . Hypertension Mother   . Bone cancer Father   . Lung cancer Father  Social History Social History   Tobacco Use  . Smoking status: Current Every Day Smoker    Types: Cigarettes  . Smokeless tobacco: Never Used  Substance Use Topics  . Alcohol use: Yes  . Drug use: Not Currently     Allergies   Aspirin, Iodine-131, Iodinated diagnostic agents, Latex, Penicillins, Amoxicillin-pot clavulanate, and Lisinopril   Review of Systems Review of Systems  Constitutional: Negative for fever.  HENT: Positive for ear pain and sore throat. Negative for congestion and ear discharge.   Respiratory: Negative for cough.   Allergic/Immunologic: Positive for immunocompromised state.  All other systems reviewed and are negative.    Physical Exam Updated Vital Signs BP 119/65 (BP Location: Left  Arm)   Pulse 85   Temp 98.6 F (37 C)   Resp 18   SpO2 95%   Physical Exam Vitals signs and nursing note reviewed.  Constitutional:      General: He is not in acute distress.    Appearance: He is well-developed. He is not diaphoretic.  HENT:     Head: Normocephalic and atraumatic.     Right Ear: Ear canal normal. No drainage, swelling or tenderness. A middle ear effusion is present. Tympanic membrane is not erythematous.     Left Ear: Tympanic membrane and ear canal normal. No drainage, swelling or tenderness.  No middle ear effusion. Tympanic membrane is not erythematous.     Nose: No congestion.     Mouth/Throat:     Mouth: Mucous membranes are moist.     Tonsils: No tonsillar exudate or tonsillar abscesses. 1+ on the right. 1+ on the left.  Eyes:     Conjunctiva/sclera: Conjunctivae normal.  Cardiovascular:     Rate and Rhythm: Normal rate and regular rhythm.     Heart sounds: Normal heart sounds.  Pulmonary:     Effort: Pulmonary effort is normal.     Breath sounds: Normal breath sounds.  Skin:    General: Skin is warm and dry.     Findings: No erythema.  Neurological:     Mental Status: He is alert and oriented to person, place, and time.  Psychiatric:        Behavior: Behavior normal.      ED Treatments / Results  Labs (all labs ordered are listed, but only abnormal results are displayed) Labs Reviewed - No data to display  EKG None  Radiology No results found.  Procedures Procedures (including critical care time)  Medications Ordered in ED Medications  acetaminophen (TYLENOL) tablet 650 mg (has no administration in time range)     Initial Impression / Assessment and Plan / ED Course  I have reviewed the triage vital signs and the nursing notes.  Pertinent labs & imaging results that were available during my care of the patient were reviewed by me and considered in my medical decision making (see chart for details).  Clinical Course as of Feb 27 1421  Tue Feb 28, 2019  [id number] 48 year old male with complaint of ear pain and sore throat.  On exam patient has a very small effusion in the right ear without bulging or redness of the TM.  Throat is unremarkable, no cervical lymphadenopathy.  Recommend patient use Zyrtec and Flonase daily, patient requests Tylenol which will be given prior to discharge.   [LM]    Clinical Course User Index [LM] Jeannie FendMurphy, Laura A, PA-C      Final Clinical Impressions(s) / ED Diagnoses   Final diagnoses:  Eustachian  tube disorder, right    ED Discharge Orders         Ordered    fluticasone (FLONASE) 50 MCG/ACT nasal spray  Daily     02/28/19 1422           Jeannie Fend, PA-C 02/28/19 1422    Wynetta Fines, MD 03/03/19 [phone]

## 2019-02-28 NOTE — ED Triage Notes (Signed)
PTAR states pt was picked up at halfway house for sore throat, facial swelling, and intermittent blurred vision for 3 days.  Vitals: 95HR, 98% on RA, temp 97.9 CBG 103

## 2019-03-25 ENCOUNTER — Encounter (HOSPITAL_COMMUNITY): Payer: Self-pay | Admitting: Emergency Medicine

## 2019-03-25 ENCOUNTER — Emergency Department (HOSPITAL_COMMUNITY)
Admission: EM | Admit: 2019-03-25 | Discharge: 2019-03-25 | Disposition: A | Discharge status: Home / Self Care | Payer: Medicare Other | Attending: Emergency Medicine | Admitting: Emergency Medicine

## 2019-03-25 ENCOUNTER — Other Ambulatory Visit: Payer: Self-pay

## 2019-03-25 DIAGNOSIS — Z794 Long term (current) use of insulin: Secondary | ICD-10-CM | POA: Diagnosis not present

## 2019-03-25 DIAGNOSIS — Z950 Presence of cardiac pacemaker: Secondary | ICD-10-CM | POA: Diagnosis not present

## 2019-03-25 DIAGNOSIS — I10 Essential (primary) hypertension: Secondary | ICD-10-CM | POA: Diagnosis not present

## 2019-03-25 DIAGNOSIS — F17210 Nicotine dependence, cigarettes, uncomplicated: Secondary | ICD-10-CM | POA: Insufficient documentation

## 2019-03-25 DIAGNOSIS — Z91040 Latex allergy status: Secondary | ICD-10-CM | POA: Diagnosis not present

## 2019-03-25 DIAGNOSIS — I5022 Chronic systolic (congestive) heart failure: Secondary | ICD-10-CM | POA: Insufficient documentation

## 2019-03-25 DIAGNOSIS — Z79899 Other long term (current) drug therapy: Secondary | ICD-10-CM | POA: Diagnosis not present

## 2019-03-25 DIAGNOSIS — E119 Type 2 diabetes mellitus without complications: Secondary | ICD-10-CM | POA: Insufficient documentation

## 2019-03-25 DIAGNOSIS — H9312 Tinnitus, left ear: Secondary | ICD-10-CM | POA: Insufficient documentation

## 2019-03-25 DIAGNOSIS — R519 Headache, unspecified: Secondary | ICD-10-CM

## 2019-03-25 DIAGNOSIS — R51 Headache: Secondary | ICD-10-CM | POA: Diagnosis not present

## 2019-03-25 DIAGNOSIS — Z7901 Long term (current) use of anticoagulants: Secondary | ICD-10-CM | POA: Insufficient documentation

## 2019-03-25 DIAGNOSIS — H53149 Visual discomfort, unspecified: Secondary | ICD-10-CM | POA: Diagnosis not present

## 2019-03-25 LAB — CBC WITH DIFFERENTIAL/PLATELET
Abs Immature Granulocytes: 0.03 10*3/uL (ref 0.00–0.07)
Basophils Absolute: 0 10*3/uL (ref 0.0–0.1)
Basophils Relative: 0 %
Eosinophils Absolute: 0.1 10*3/uL (ref 0.0–0.5)
Eosinophils Relative: 1 %
HCT: 43.2 % (ref 39.0–52.0)
Hemoglobin: 13.6 g/dL (ref 13.0–17.0)
Immature Granulocytes: 0 %
Lymphocytes Relative: 29 %
Lymphs Abs: 3 10*3/uL (ref 0.7–4.0)
MCH: 28.6 pg (ref 26.0–34.0)
MCHC: 31.5 g/dL (ref 30.0–36.0)
MCV: 90.8 fL (ref 80.0–100.0)
Monocytes Absolute: 0.9 10*3/uL (ref 0.1–1.0)
Monocytes Relative: 9 %
Neutro Abs: 6.4 10*3/uL (ref 1.7–7.7)
Neutrophils Relative %: 61 %
Platelets: 302 10*3/uL (ref 150–400)
RBC: 4.76 MIL/uL (ref 4.22–5.81)
RDW: 13.4 % (ref 11.5–15.5)
WBC: 10.5 10*3/uL (ref 4.0–10.5)
nRBC: 0 % (ref 0.0–0.2)

## 2019-03-25 LAB — COMPREHENSIVE METABOLIC PANEL
ALT: 15 U/L (ref 0–44)
AST: 21 U/L (ref 15–41)
Albumin: 4 g/dL (ref 3.5–5.0)
Alkaline Phosphatase: 46 U/L (ref 38–126)
Anion gap: 11 (ref 5–15)
BUN: 14 mg/dL (ref 6–20)
CO2: 26 mmol/L (ref 22–32)
Calcium: 8.7 mg/dL — ABNORMAL LOW (ref 8.9–10.3)
Chloride: 101 mmol/L (ref 98–111)
Creatinine, Ser: 1.06 mg/dL (ref 0.61–1.24)
GFR calc Af Amer: >60 mL/min (ref >60)
GFR calc non Af Amer: >60 mL/min (ref >60)
Glucose, Bld: 76 mg/dL (ref 70–99)
Potassium: 4.1 mmol/L (ref 3.5–5.1)
Sodium: 138 mmol/L (ref 135–145)
Total Bilirubin: 0.3 mg/dL (ref 0.3–1.2)
Total Protein: 7.4 g/dL (ref 6.5–8.1)

## 2019-03-25 LAB — URINALYSIS, ROUTINE W REFLEX MICROSCOPIC
Bilirubin Urine: NEGATIVE
Glucose, UA: NEGATIVE mg/dL
Hgb urine dipstick: NEGATIVE
Ketones, ur: NEGATIVE mg/dL
Leukocytes,Ua: NEGATIVE
Nitrite: NEGATIVE
Protein, ur: NEGATIVE mg/dL
Specific Gravity, Urine: 1.013 (ref 1.005–1.030)
pH: 5 (ref 5.0–8.0)

## 2019-03-25 MED ORDER — DIPHENHYDRAMINE HCL 50 MG/ML IJ SOLN
12.5000 mg | Freq: Once | INTRAMUSCULAR | Status: AC
  Administered 2019-03-25: 12.5 mg via INTRAVENOUS
  Filled 2019-03-25: qty 1

## 2019-03-25 MED ORDER — AZITHROMYCIN 500 MG PO TABS: 500.0000 mg | ORAL_TABLET | Freq: Every day | ORAL | 0 refills | Status: AC

## 2019-03-25 MED ORDER — PROCHLORPERAZINE EDISYLATE 10 MG/2ML IJ SOLN
10.0000 mg | Freq: Once | INTRAMUSCULAR | Status: AC
  Administered 2019-03-25: 10 mg via INTRAVENOUS
  Filled 2019-03-25: qty 2

## 2019-03-25 MED ORDER — CETIRIZINE HCL 5 MG PO TABS: 5.0000 mg | ORAL_TABLET | Freq: Every day | ORAL | 0 refills | Status: DC

## 2019-03-25 MED ORDER — ONDANSETRON HCL 4 MG/2ML IJ SOLN: 4.0000 mg | Freq: Once | INTRAMUSCULAR | Status: DC

## 2019-03-25 NOTE — ED Triage Notes (Addendum)
Pt to ED via [facility] from a halfway house, c/o headache x's 2 weeks with ringing in his ears.    CBG per EMS 103  Pt also c/oi nausea and numbness to right hand,  Pt st's he was seen at [facility] for same but not any better

## 2019-03-25 NOTE — Discharge Instructions (Addendum)
[name] were seen in the ED today for left sided headache and ear ringing. You had improvement of symptoms with treatment. Please take azithromycin once a day for 5 days and cetirizine once a day for one month. Please follow up with your PCP for further management.  If your symptoms worsen or you develop a fever, chills, dizziness, focal weakness, please seek emergent medical care.

## 2019-03-25 NOTE — ED Provider Notes (Signed)
[facility] EMERGENCY DEPARTMENT Provider Note   CSN: [id number] Arrival date & time: 03/25/19  1534     History   Chief Complaint Chief Complaint  Patient presents with  . Headache    HPI Luis [name] is a 48 y.o. male with Hx of well controlled DMII, HTN, HLD, CHF with pacemaker and defibrillator in place presenting to the ED with two weeks of intermittent left sided headaches and constant ringing in his left ear. Headaches are associated with sensitivity to light.  He has been taking acetaminophen daily for the headaches with some relief. However, he developed an occipital headache yesterday with associated numbness to right upper and lower extremity. He is also complaining of nausea and reports two episodes of nonbloody nonbilious emesis this morning. He denies any fevers, chills, chest pain, dizziness, shortness of breath, vision changes, decreased hearing, ear drainage, ear pain, or focal weakness.     Past Medical History:  Diagnosis Date  . Anxiety   . CHF (congestive heart failure) (HCC)   . Diabetes mellitus without complication (HCC)   . GERD (gastroesophageal reflux disease)   . HLD (hyperlipidemia)   . Hypertension   . Pacemaker    defib as well  . PTSD (post-traumatic stress disorder)    on Depakote    Patient Active Problem List   Diagnosis Date Noted  . Acute intractable headache   . Cocaine abuse (HCC)   . Acute non intractable tension-type headache   . Blurred vision, bilateral   . Chronic systolic CHF (congestive heart failure) (HCC) 01/17/2019  . Pacemaker   . HLD (hyperlipidemia)   . Diabetes mellitus without complication (HCC)   . Hypertension   . GERD (gastroesophageal reflux disease)   . Chest pain   . Tobacco abuse   . Alcohol abuse     Past Surgical History:  Procedure Laterality Date  . PACEMAKER PLACEMENT          Home Medications    Prior to Admission medications   Medication Sig Start Date End Date Taking?  Authorizing Provider  clopidogrel (PLAVIX) 75 MG tablet Take 75 mg by mouth daily. 01/13/19   [provider]  divalproex (DEPAKOTE) 500 MG DR tablet Take 500 mg by mouth 2 (two) times a day. 06/30/18   [provider]  fluticasone (FLONASE) 50 MCG/ACT nasal spray Place 1 spray into both nostrils daily. 02/28/19   Jeannie Fend, PA-C  folic acid (FOLVITE) 1 MG tablet Take 1 tablet (1 mg total) by mouth daily. 01/21/19   Narda Bonds, MD  furosemide (LASIX) 80 MG tablet Take 80 mg by mouth daily. 09/13/18   [provider]  HUMALOG MIX 75/25 KWIKPEN (75-25) 100 UNIT/ML Kwikpen Inject 60 Units into the skin 3 (three) times daily before meals.  01/13/19   [provider]  metoprolol succinate (TOPROL-XL) 100 MG 24 hr tablet Take 150 mg by mouth every morning.  09/13/18   [provider]  pantoprazole (PROTONIX) 40 MG tablet Take 40 mg by mouth daily. 09/23/18   [provider]  prazosin (MINIPRESS) 2 MG capsule Take 2 mg by mouth at bedtime. 10/28/18   [provider]  rosuvastatin (CRESTOR) 20 MG tablet Take 20 mg by mouth daily. 09/21/18   [provider]  sertraline (ZOLOFT) 50 MG tablet Take 50 mg by mouth 3 (three) times daily.  10/28/18   [provider]  traZODone (DESYREL) 100 MG tablet Take 100 mg by mouth at  bedtime. 12/19/18   [provider]  VICTOZA 18 MG/3ML SOPN Inject 1.8 mg into the skin daily before breakfast.  01/09/19   [provider]    Family History Family History  Problem Relation Age of Onset  . Hypertension Mother   . Bone cancer Father   . Lung cancer Father     Social History Social History   Tobacco Use  . Smoking status: Current Every Day Smoker    Types: Cigarettes  . Smokeless tobacco: Never Used  Substance Use Topics  . Alcohol use: Yes  . Drug use: Not Currently     Allergies   Aspirin, Iodine-131, Iodinated diagnostic agents, Latex, Penicillins, Amoxicillin-pot  clavulanate, and Lisinopril   Review of Systems Review of Systems  Constitutional: Negative for activity change, appetite change, chills and fever.  HENT: Positive for tinnitus. Negative for congestion, dental problem, ear discharge, ear pain, hearing loss, postnasal drip, sinus pain and sore throat.   Eyes: Positive for photophobia. Negative for pain, discharge and visual disturbance.  Respiratory: Negative for cough, shortness of breath and wheezing.   Cardiovascular: Negative for chest pain and leg swelling.  Gastrointestinal: Positive for nausea and vomiting. Negative for abdominal pain, constipation and diarrhea.  Endocrine: Negative.   Genitourinary: Negative.   Musculoskeletal: Negative for back pain, gait problem, neck pain and neck stiffness.  Skin: Negative.   Allergic/Immunologic: Negative.   Neurological: Positive for numbness and headaches. Negative for dizziness, speech difficulty and weakness.  Psychiatric/Behavioral: Negative.     Physical Exam Updated Vital Signs BP 126/63 (BP Location: Left Arm)   Pulse 90   Temp 98.2 F (36.8 C) (Oral)   Resp 18   Wt (!) 145.2 kg   SpO2 97%   BMI 37.46 kg/m   Physical Exam Vitals signs reviewed.  Constitutional:      General: He is not in acute distress.    Appearance: He is obese. He is not ill-appearing or diaphoretic.  HENT:     Head: Normocephalic and atraumatic.     Right Ear: Hearing, tympanic membrane, ear canal and external ear normal.     Left Ear: Hearing and external ear normal. A middle ear effusion is present.     Ears:     Weber exam findings: does not lateralize.    Right Rinne: AC > BC.    Left Rinne: AC > BC. Eyes:     Extraocular Movements: Extraocular movements intact.     Pupils: Pupils are equal, round, and reactive to light.  Neck:     Musculoskeletal: Normal range of motion and neck supple.  Cardiovascular:     Rate and Rhythm: Normal rate and regular rhythm.     Heart sounds: Normal heart  sounds. No murmur. No friction rub. No gallop.   Pulmonary:     Effort: Pulmonary effort is normal. No respiratory distress.     Breath sounds: Normal breath sounds. No wheezing or rhonchi.  Abdominal:     General: Bowel sounds are normal. There is no distension.     Palpations: Abdomen is soft.     Tenderness: There is no abdominal tenderness.  Musculoskeletal: Normal range of motion.        General: No swelling or tenderness.  Skin:    General: Skin is warm and dry.     Capillary Refill: Capillary refill takes less than 2 seconds.  Neurological:     Mental Status: He is alert and oriented to person, place, and time. Mental  status is at baseline.     Cranial Nerves: No cranial nerve deficit, dysarthria or facial asymmetry.     Sensory: Sensory deficit present.     Motor: No weakness.     Coordination: Coordination normal.     Comments: Decreased sensation to light touch at left forehead     ED Treatments / Results  Labs (all labs ordered are listed, but only abnormal results are displayed) Labs Reviewed  COMPREHENSIVE METABOLIC PANEL - Abnormal; Notable for the following components:      Result Value   Calcium 8.7 (*)    All other components within normal limits  CBC WITH DIFFERENTIAL/PLATELET  URINALYSIS, ROUTINE W REFLEX MICROSCOPIC    EKG None  Radiology No results found.  Procedures Procedures (including critical care time)  Medications Ordered in ED Medications  prochlorperazine (COMPAZINE) injection 10 mg (10 mg Intravenous Given 03/25/19 1734)  diphenhydrAMINE (BENADRYL) injection 12.5 mg (12.5 mg Intravenous Given 03/25/19 1735)     Initial Impression / Assessment and Plan / ED Course  I have reviewed the triage vital signs and the nursing notes.  Pertinent labs & imaging results that were available during my care of the patient were reviewed by me and considered in my medical decision making (see chart for details).  Patient is a 48yo male with Hx of  DMII, HTN, HLD, and CHF with defibrillator and pacemaker in place presenting with two week history of left sided headaches and tinnitus. Patient reports constant left ear tinnitus and daily headaches for which he is taking acetaminophen with improvement in the headaches. He also endorses photosensitivity with the headaches. He endorses an occipital headache two days ago associated with nausea and numbness in right upper and lower extremities. He had two episodes of emesis prior to presentation. He denies any fevers, chills, dizziness, vision changes, or focal weakness.   Patient was evaluated for ear pain and sore throat on 9/1 and found to have small effusion in right ear. Patient recommended use for zyrtec and flonase daily. Patient reports minimal relief with this. He has also been evaluated in clinic for bilateral tinnitus with unknown etiology and is scheduled for audiology testing with plans of ENT referral if normal.   On presentation, patient is hemodynamically stable. He does not appear to be in any acute distress. No significant neurologic deficits appreciated on examination. Middle ear effusion present on the left. Prior brain and neck imaging from July without evidence of intracranial mass or carotid and vertebral artery narrowing.   Patient treated for headache in the ED with symptomatic improvement. He is discharged with azithromycin and zyrtec and recommended for follow up with PCP for further evaluation of tinnitus. Strict return precautions provided.   Final Clinical Impressions(s) / ED Diagnoses   Final diagnoses:  None    ED Discharge Orders    None       Harvie Heck, MD 03/25/19 1919    Blanchie Dessert, MD 03/26/19 [id number]

## 2019-05-01 ENCOUNTER — Other Ambulatory Visit: Payer: Self-pay | Admitting: Otolaryngology

## 2019-05-01 ENCOUNTER — Other Ambulatory Visit (HOSPITAL_COMMUNITY): Payer: Self-pay | Admitting: Otolaryngology

## 2019-05-01 DIAGNOSIS — H918X2 Other specified hearing loss, left ear: Secondary | ICD-10-CM

## 2019-05-01 DIAGNOSIS — IMO0001 Reserved for inherently not codable concepts without codable children: Secondary | ICD-10-CM

## 2019-05-04 ENCOUNTER — Encounter (HOSPITAL_COMMUNITY): Payer: Self-pay | Admitting: Emergency Medicine

## 2019-05-04 ENCOUNTER — Inpatient Hospital Stay (HOSPITAL_COMMUNITY)
Admission: EM | Admit: 2019-05-04 | Discharge: 2019-05-07 | DRG: 189 | Disposition: A | Discharge status: Home / Self Care | Payer: Medicare Other | Attending: Internal Medicine | Admitting: Internal Medicine

## 2019-05-04 ENCOUNTER — Emergency Department (HOSPITAL_COMMUNITY): Payer: Medicare Other

## 2019-05-04 ENCOUNTER — Other Ambulatory Visit: Payer: Self-pay

## 2019-05-04 DIAGNOSIS — B348 Other viral infections of unspecified site: Secondary | ICD-10-CM | POA: Diagnosis present

## 2019-05-04 DIAGNOSIS — I110 Hypertensive heart disease with heart failure: Secondary | ICD-10-CM | POA: Diagnosis present

## 2019-05-04 DIAGNOSIS — I5022 Chronic systolic (congestive) heart failure: Secondary | ICD-10-CM | POA: Diagnosis present

## 2019-05-04 DIAGNOSIS — E1169 Type 2 diabetes mellitus with other specified complication: Secondary | ICD-10-CM

## 2019-05-04 DIAGNOSIS — B9789 Other viral agents as the cause of diseases classified elsewhere: Secondary | ICD-10-CM | POA: Diagnosis present

## 2019-05-04 DIAGNOSIS — Z794 Long term (current) use of insulin: Secondary | ICD-10-CM

## 2019-05-04 DIAGNOSIS — Z7902 Long term (current) use of antithrombotics/antiplatelets: Secondary | ICD-10-CM

## 2019-05-04 DIAGNOSIS — F329 Major depressive disorder, single episode, unspecified: Secondary | ICD-10-CM | POA: Diagnosis present

## 2019-05-04 DIAGNOSIS — I10 Essential (primary) hypertension: Secondary | ICD-10-CM | POA: Diagnosis present

## 2019-05-04 DIAGNOSIS — J989 Respiratory disorder, unspecified: Secondary | ICD-10-CM | POA: Diagnosis present

## 2019-05-04 DIAGNOSIS — Z886 Allergy status to analgesic agent status: Secondary | ICD-10-CM

## 2019-05-04 DIAGNOSIS — Z801 Family history of malignant neoplasm of trachea, bronchus and lung: Secondary | ICD-10-CM

## 2019-05-04 DIAGNOSIS — Z20822 Contact with and (suspected) exposure to covid-19: Secondary | ICD-10-CM | POA: Diagnosis present

## 2019-05-04 DIAGNOSIS — K219 Gastro-esophageal reflux disease without esophagitis: Secondary | ICD-10-CM | POA: Diagnosis present

## 2019-05-04 DIAGNOSIS — F17210 Nicotine dependence, cigarettes, uncomplicated: Secondary | ICD-10-CM | POA: Diagnosis present

## 2019-05-04 DIAGNOSIS — Z888 Allergy status to other drugs, medicaments and biological substances status: Secondary | ICD-10-CM

## 2019-05-04 DIAGNOSIS — G4733 Obstructive sleep apnea (adult) (pediatric): Secondary | ICD-10-CM | POA: Diagnosis present

## 2019-05-04 DIAGNOSIS — I428 Other cardiomyopathies: Secondary | ICD-10-CM | POA: Diagnosis present

## 2019-05-04 DIAGNOSIS — F419 Anxiety disorder, unspecified: Secondary | ICD-10-CM | POA: Diagnosis present

## 2019-05-04 DIAGNOSIS — Z79899 Other long term (current) drug therapy: Secondary | ICD-10-CM

## 2019-05-04 DIAGNOSIS — E785 Hyperlipidemia, unspecified: Secondary | ICD-10-CM | POA: Diagnosis present

## 2019-05-04 DIAGNOSIS — E66813 Obesity, class 3: Secondary | ICD-10-CM | POA: Diagnosis present

## 2019-05-04 DIAGNOSIS — Z880 Allergy status to penicillin: Secondary | ICD-10-CM

## 2019-05-04 DIAGNOSIS — Z8249 Family history of ischemic heart disease and other diseases of the circulatory system: Secondary | ICD-10-CM

## 2019-05-04 DIAGNOSIS — J9601 Acute respiratory failure with hypoxia: Secondary | ICD-10-CM | POA: Diagnosis not present

## 2019-05-04 DIAGNOSIS — Z950 Presence of cardiac pacemaker: Secondary | ICD-10-CM

## 2019-05-04 DIAGNOSIS — Z20828 Contact with and (suspected) exposure to other viral communicable diseases: Secondary | ICD-10-CM | POA: Diagnosis present

## 2019-05-04 DIAGNOSIS — E119 Type 2 diabetes mellitus without complications: Secondary | ICD-10-CM

## 2019-05-04 DIAGNOSIS — F4310 Post-traumatic stress disorder, unspecified: Secondary | ICD-10-CM | POA: Diagnosis present

## 2019-05-04 DIAGNOSIS — J069 Acute upper respiratory infection, unspecified: Secondary | ICD-10-CM | POA: Diagnosis present

## 2019-05-04 DIAGNOSIS — Z91040 Latex allergy status: Secondary | ICD-10-CM

## 2019-05-04 LAB — SARS CORONAVIRUS 2 (TAT 6-24 HRS): SARS Coronavirus 2: NEGATIVE

## 2019-05-04 LAB — COMPREHENSIVE METABOLIC PANEL
ALT: 12 U/L (ref 0–44)
AST: 17 U/L (ref 15–41)
Albumin: 3.4 g/dL — ABNORMAL LOW (ref 3.5–5.0)
Alkaline Phosphatase: 44 U/L (ref 38–126)
Anion gap: 9 (ref 5–15)
BUN: 5 mg/dL — ABNORMAL LOW (ref 6–20)
CO2: 27 mmol/L (ref 22–32)
Calcium: 8.7 mg/dL — ABNORMAL LOW (ref 8.9–10.3)
Chloride: 103 mmol/L (ref 98–111)
Creatinine, Ser: 1.01 mg/dL (ref 0.61–1.24)
GFR calc Af Amer: >60 mL/min (ref >60)
GFR calc non Af Amer: >60 mL/min (ref >60)
Glucose, Bld: 178 mg/dL — ABNORMAL HIGH (ref 70–99)
Potassium: 4.1 mmol/L (ref 3.5–5.1)
Sodium: 139 mmol/L (ref 135–145)
Total Bilirubin: 1 mg/dL (ref 0.3–1.2)
Total Protein: 6.9 g/dL (ref 6.5–8.1)

## 2019-05-04 LAB — RESPIRATORY PANEL BY PCR

## 2019-05-04 LAB — CBC WITH DIFFERENTIAL/PLATELET
Abs Immature Granulocytes: 0.04 10*3/uL (ref 0.00–0.07)
Basophils Absolute: 0 10*3/uL (ref 0.0–0.1)
Basophils Relative: 0 %
Eosinophils Absolute: 0.2 10*3/uL (ref 0.0–0.5)
Eosinophils Relative: 2 %
HCT: 45.1 % (ref 39.0–52.0)
Hemoglobin: 14.4 g/dL (ref 13.0–17.0)
Immature Granulocytes: 0 %
Lymphocytes Relative: 18 %
Lymphs Abs: 1.7 10*3/uL (ref 0.7–4.0)
MCH: 28.8 pg (ref 26.0–34.0)
MCHC: 31.9 g/dL (ref 30.0–36.0)
MCV: 90.2 fL (ref 80.0–100.0)
Monocytes Absolute: 0.9 10*3/uL (ref 0.1–1.0)
Monocytes Relative: 9 %
Neutro Abs: 6.7 10*3/uL (ref 1.7–7.7)
Neutrophils Relative %: 71 %
Platelets: 218 10*3/uL (ref 150–400)
RBC: 5 MIL/uL (ref 4.22–5.81)
RDW: 13.5 % (ref 11.5–15.5)
WBC: 9.5 10*3/uL (ref 4.0–10.5)
nRBC: 0 % (ref 0.0–0.2)

## 2019-05-04 LAB — BRAIN NATRIURETIC PEPTIDE: B Natriuretic Peptide: 78.1 pg/mL (ref 0.0–100.0)

## 2019-05-04 LAB — CBG MONITORING, ED
Glucose-Capillary: 328 mg/dL — ABNORMAL HIGH (ref 70–99)
Glucose-Capillary: 230 mg/dL — ABNORMAL HIGH (ref 70–99)

## 2019-05-04 LAB — ABO/RH: ABO/RH(D): B POS

## 2019-05-04 LAB — INFLUENZA PANEL BY PCR (TYPE A & B)
Influenza A By PCR: NEGATIVE
Influenza B By PCR: NEGATIVE

## 2019-05-04 LAB — PROCALCITONIN: Procalcitonin: <0.1 ng/mL

## 2019-05-04 LAB — D-DIMER, QUANTITATIVE: D-Dimer, Quant: 0.57 ug/mL-FEU — ABNORMAL HIGH (ref 0.00–0.50)

## 2019-05-04 LAB — FERRITIN: Ferritin: 51 ng/mL (ref 24–336)

## 2019-05-04 LAB — LACTATE DEHYDROGENASE: LDH: 179 U/L (ref 98–192)

## 2019-05-04 LAB — FIBRINOGEN: Fibrinogen: 495 mg/dL — ABNORMAL HIGH (ref 210–475)

## 2019-05-04 LAB — TROPONIN I (HIGH SENSITIVITY)
Troponin I (High Sensitivity): 42 ng/L — ABNORMAL HIGH (ref <18)
Troponin I (High Sensitivity): 63 ng/L — ABNORMAL HIGH (ref <18)

## 2019-05-04 LAB — C-REACTIVE PROTEIN: CRP: 5.2 mg/dL — ABNORMAL HIGH (ref <1.0)

## 2019-05-04 MED ORDER — FOLIC ACID 1 MG PO TABS
1.0000 mg | ORAL_TABLET | Freq: Every day | ORAL | Status: DC
  Administered 2019-05-04 – 2019-05-07 (×4): 1 mg via ORAL
  Filled 2019-05-04 (×4): qty 1

## 2019-05-04 MED ORDER — ROSUVASTATIN CALCIUM 20 MG PO TABS
20.0000 mg | ORAL_TABLET | Freq: Every day | ORAL | Status: DC
  Administered 2019-05-04 – 2019-05-07 (×4): 20 mg via ORAL
  Filled 2019-05-04 (×4): qty 1

## 2019-05-04 MED ORDER — ACETAMINOPHEN 325 MG PO TABS
650.0000 mg | ORAL_TABLET | Freq: Once | ORAL | Status: AC
  Administered 2019-05-04: 650 mg via ORAL
  Filled 2019-05-04: qty 2

## 2019-05-04 MED ORDER — AEROCHAMBER PLUS FLO-VU LARGE MISC
1.0000 | Freq: Once | Status: AC
  Administered 2019-05-04: 1

## 2019-05-04 MED ORDER — INSULIN ASPART 100 UNIT/ML ~~LOC~~ SOLN
6.0000 [IU] | Freq: Three times a day (TID) | SUBCUTANEOUS | Status: DC
  Administered 2019-05-04 – 2019-05-07 (×9): 6 [IU] via SUBCUTANEOUS

## 2019-05-04 MED ORDER — TRAZODONE HCL 100 MG PO TABS
100.0000 mg | ORAL_TABLET | Freq: Every day | ORAL | Status: DC
  Administered 2019-05-04 – 2019-05-06 (×3): 100 mg via ORAL
  Filled 2019-05-04 (×3): qty 1

## 2019-05-04 MED ORDER — INSULIN ASPART 100 UNIT/ML ~~LOC~~ SOLN
0.0000 [IU] | Freq: Every day | SUBCUTANEOUS | Status: DC
  Administered 2019-05-04 – 2019-05-05 (×2): 2 [IU] via SUBCUTANEOUS

## 2019-05-04 MED ORDER — METOPROLOL SUCCINATE ER 50 MG PO TB24
150.0000 mg | ORAL_TABLET | Freq: Every morning | ORAL | Status: DC
  Administered 2019-05-05 – 2019-05-07 (×3): 150 mg via ORAL
  Filled 2019-05-04 (×3): qty 1

## 2019-05-04 MED ORDER — INSULIN GLARGINE 100 UNIT/ML ~~LOC~~ SOLN
35.0000 [IU] | Freq: Two times a day (BID) | SUBCUTANEOUS | Status: DC
  Administered 2019-05-04 – 2019-05-07 (×6): 35 [IU] via SUBCUTANEOUS
  Filled 2019-05-04 (×7): qty 0.35

## 2019-05-04 MED ORDER — INSULIN ASPART 100 UNIT/ML ~~LOC~~ SOLN
0.0000 [IU] | Freq: Three times a day (TID) | SUBCUTANEOUS | Status: DC
  Administered 2019-05-04: 15 [IU] via SUBCUTANEOUS
  Administered 2019-05-05: 4 [IU] via SUBCUTANEOUS
  Administered 2019-05-05: 7 [IU] via SUBCUTANEOUS
  Administered 2019-05-06: 4 [IU] via SUBCUTANEOUS
  Administered 2019-05-06: 16:00:00 3 [IU] via SUBCUTANEOUS
  Administered 2019-05-06 – 2019-05-07 (×2): 7 [IU] via SUBCUTANEOUS

## 2019-05-04 MED ORDER — SERTRALINE HCL 50 MG PO TABS
50.0000 mg | ORAL_TABLET | Freq: Three times a day (TID) | ORAL | Status: DC
  Administered 2019-05-04 – 2019-05-07 (×9): 50 mg via ORAL
  Filled 2019-05-04 (×9): qty 1

## 2019-05-04 MED ORDER — PANTOPRAZOLE SODIUM 40 MG PO TBEC
40.0000 mg | DELAYED_RELEASE_TABLET | Freq: Every day | ORAL | Status: DC
  Administered 2019-05-04 – 2019-05-07 (×4): 40 mg via ORAL
  Filled 2019-05-04 (×4): qty 1

## 2019-05-04 MED ORDER — CLOPIDOGREL BISULFATE 75 MG PO TABS
75.0000 mg | ORAL_TABLET | Freq: Every day | ORAL | Status: DC
  Administered 2019-05-04 – 2019-05-07 (×4): 75 mg via ORAL
  Filled 2019-05-04 (×4): qty 1

## 2019-05-04 MED ORDER — SODIUM CHLORIDE 0.9 % IV BOLUS
500.0000 mL | Freq: Once | INTRAVENOUS | Status: AC
  Administered 2019-05-04: 14:00:00 via INTRAVENOUS

## 2019-05-04 MED ORDER — ENOXAPARIN SODIUM 40 MG/0.4ML ~~LOC~~ SOLN
40.0000 mg | SUBCUTANEOUS | Status: DC
  Administered 2019-05-04: 18:00:00 40 mg via SUBCUTANEOUS
  Filled 2019-05-04: qty 0.4

## 2019-05-04 MED ORDER — DIVALPROEX SODIUM 500 MG PO DR TAB
500.0000 mg | DELAYED_RELEASE_TABLET | Freq: Two times a day (BID) | ORAL | Status: DC
  Administered 2019-05-04 – 2019-05-07 (×7): 500 mg via ORAL
  Filled 2019-05-04: qty 2
  Filled 2019-05-04 (×2): qty 1
  Filled 2019-05-04: qty 2
  Filled 2019-05-04 (×3): qty 1

## 2019-05-04 MED ORDER — ALBUTEROL SULFATE HFA 108 (90 BASE) MCG/ACT IN AERS
6.0000 | INHALATION_SPRAY | Freq: Once | RESPIRATORY_TRACT | Status: AC
  Administered 2019-05-04: 6 via RESPIRATORY_TRACT
  Filled 2019-05-04: qty 6.7

## 2019-05-04 NOTE — ED Notes (Signed)
Pt given crackers and  Ginger ale tray ordered

## 2019-05-04 NOTE — ED Notes (Signed)
Lunch Tray Ordered @ 1341-per Jenny Reichmann, RN called by Levada Dy

## 2019-05-04 NOTE — ED Notes (Signed)
Dinner Tray Ordered @ 1803.  

## 2019-05-04 NOTE — H&P (Signed)
Date: 05/04/2019               Patient Name:  Luis [name] MRN: [id number]  DOB: March 06, 1971 Age / Sex: 48 y.o., male   PCP: System, Pcp Not In         Medical Service: Internal Medicine Teaching Service         Attending Physician: Dr. Carmin Muskrat, MD    First Contact: Dr. Court Joy Pager: [phone]  Second Contact: Dr. Koleen Distance Pager: [phone]       After Hours (After 5p/  First Contact Pager: [phone]  weekends / holidays): Second Contact Pager: [phone]   Chief Complaint: cough, shortness of breath   History of Present Illness: Mr.Luis [name] is a 48 y/o gentleman with history of CHF s/p pacemaker placement, DM, OSA, HTN, HLD, PTSD who presents for 3 day history of fevers, chills, cough, headache, diarrhea, nasal congestion, sore throat, chest pain and  body aches. Patient reports fever to 103 at home. He has been taking Tylenol and cold showers with minimal relief. No episodes in the past similar to this episode. He denies any recent travel or sick contacts. Denies changes in vision, tinnitus (being worked up with MRI) , no nausea, vomiting, abdominal pain, melena, or changes in urination.     Meds: Current Meds  Medication Sig  . cetirizine (ZYRTEC) 5 MG tablet Take 1 tablet (5 mg total) by mouth daily.  . clopidogrel (PLAVIX) 75 MG tablet Take 75 mg by mouth daily.  . divalproex (DEPAKOTE) 500 MG DR tablet Take 500 mg by mouth 2 (two) times a day.  . fluticasone (FLONASE) 50 MCG/ACT nasal spray Place 1 spray into both nostrils daily.  . folic acid (FOLVITE) 1 MG tablet Take 1 tablet (1 mg total) by mouth daily.  . furosemide (LASIX) 80 MG tablet Take 80 mg by mouth daily.  Marland Kitchen HUMALOG MIX 75/25 KWIKPEN (75-25) 100 UNIT/ML Kwikpen Inject 60 Units into the skin 3 (three) times daily before meals.   . metoprolol succinate (TOPROL-XL) 100 MG 24 hr tablet Take 150 mg by mouth every morning.   . pantoprazole (PROTONIX) 40 MG tablet Take 40 mg by mouth daily.  . potassium chloride  (KLOR-CON) 10 MEQ tablet Take 10 mEq by mouth daily.  . prazosin (MINIPRESS) 2 MG capsule Take 2 mg by mouth at bedtime.  . rosuvastatin (CRESTOR) 20 MG tablet Take 20 mg by mouth daily.  . sertraline (ZOLOFT) 50 MG tablet Take 50 mg by mouth 3 (three) times daily.   . traZODone (DESYREL) 100 MG tablet Take 100 mg by mouth at bedtime.  Marland Kitchen VICTOZA 18 MG/3ML SOPN Inject 1.8 mg into the skin daily before breakfast.      Allergies: Allergies as of 05/04/2019 - Review Complete 05/04/2019  Allergen Reaction Noted  . Aspirin Anaphylaxis 10/01/2016  . Iodine-131 Hives 11/25/2016  . Iodinated diagnostic agents Hives 10/01/2016  . Latex Rash and Hives 03/29/2017  . Penicillins Nausea And Vomiting 01/17/2019  . Amoxicillin-pot clavulanate Diarrhea 10/01/2016  . Lisinopril  10/01/2016   Past Medical History:  Diagnosis Date  . Anxiety   . CHF (congestive heart failure) ([place])   . Diabetes mellitus without complication ([place])   . GERD (gastroesophageal reflux disease)   . HLD (hyperlipidemia)   . Hypertension   . Pacemaker    defib as well  . PTSD (post-traumatic stress disorder)    on Depakote    Family History:  Family History  Problem Relation Age of  Onset  . Hypertension Mother   . Bone cancer Father   . Lung cancer Father     Social History:  Social History   Tobacco Use  . Smoking status: Current Every Day Smoker    Types: Cigarettes  . Smokeless tobacco: Never Used  Substance Use Topics  . Alcohol use: Yes  . Drug use: Not Currently     Review of Systems: A complete ROS was negative except as per HPI.   Physical Exam: Blood pressure 120/64, pulse (!) 118, temperature 100.3 F (37.9 C), temperature source Oral, resp. rate (!) 23, height 5\' 5"  (1.651 m), weight (!) 140.2 kg, SpO2 93 %.  Physical Exam Constitutional:      Appearance: He is obese. He is not ill-appearing.  Eyes:     General: No scleral icterus.    Conjunctiva/sclera: Conjunctivae normal.   Cardiovascular:     Rate and Rhythm: Regular rhythm. Tachycardia present.  Pulmonary:     Effort: Respiratory distress present.     Breath sounds: No wheezing or rhonchi.  Abdominal:     General: Bowel sounds are normal.     Palpations: Abdomen is soft.     Tenderness: There is no abdominal tenderness.  Musculoskeletal:     Right lower leg: No edema.     Left lower leg: No edema.  Skin:    General: Skin is warm and dry.  Neurological:     General: No focal deficit present.     Mental Status: He is oriented to person, place, and time.  Psychiatric:        Mood and Affect: Mood normal.        Behavior: Behavior normal.     CXR: personally reviewed my interpretation is no effusions or focal consolidation noted.   Assessment & Plan by Problem: Active Problems:   * No active hospital problems. *  Mr.Luis [name] is a 48 y/o gentleman with history of CHF s/p pacemaker placement, DM, OSA, HTN, HLD, PTSD who presents with URI.   # URI:  Patient tachypnic, hypoxic on room air (SpO2 85%). CXR negative and COVID test negative. Patient febrile and given tylenol.  - RVP -Influenza PCR - Tylenol PRN  # Type II DM:  Patient on  Victoza 1.8 mg and Humalog Mix 75/25 60 units TID at home. Hgb A1c 11.2 on 7/22  - started on Lantus 35 units BID  - SSI  # NICM  - holding lasix given soft blood pressures - holding Toprol xl  # HLD - continue rosuvastatin   #MDD, PTSD - continue Sertraline - continue Depakote - continue Trazodone    Dispo: Admit patient to Observation with expected length of stay less than 2 midnights.  Signed:  8/22, MD PGY1  [phone]

## 2019-05-04 NOTE — ED Triage Notes (Signed)
Pt complains of a cough x1 week and fever. Pt also endorses diarrhea and wheezing. Headache, no appetite

## 2019-05-04 NOTE — ED Provider Notes (Addendum)
[facility] EMERGENCY DEPARTMENT Provider Note   CSN: [id number] Arrival date & time: 05/04/19  8850     History   Chief Complaint Chief Complaint  Patient presents with  . Cough    HPI Quantez Schnyder is a 48 y.o. male with a history of CHF, hypertension, hyperlipidemia, diabetes mellitus, pacemaker in place, cocaine abuse, OSA on CPAP @ night, and PTSD who presents to the emergency department with complaints of cough x 3-4 days. Patient states cough is dry with associated fever w/ temp max 103, nasal congestion, sore throat, headaches (gradual onset, similar to prior), wheezing, dyspnea, chest pain with coughing (no other chest pain), body aches, & diarrhea. No alleviating/aggravating factors to sxs. No intervention PTA. He states his legs may be a bit more swollen than usual, but that his difficulty breathing/cough does not feel similar to prior HF exacerbations. Denies N/V, diaphoresis, syncope, abdominal pain, melena, or hematochezia. Denies known covid exposures.      HPI  Past Medical History:  Diagnosis Date  . Anxiety   . CHF (congestive heart failure) (HCC)   . Diabetes mellitus without complication (HCC)   . GERD (gastroesophageal reflux disease)   . HLD (hyperlipidemia)   . Hypertension   . Pacemaker    defib as well  . PTSD (post-traumatic stress disorder)    on Depakote    Patient Active Problem List   Diagnosis Date Noted  . Acute intractable headache   . Cocaine abuse (HCC)   . Acute non intractable tension-type headache   . Blurred vision, bilateral   . Chronic systolic CHF (congestive heart failure) (HCC) 01/17/2019  . Pacemaker   . HLD (hyperlipidemia)   . Diabetes mellitus without complication (HCC)   . Hypertension   . GERD (gastroesophageal reflux disease)   . Chest pain   . Tobacco abuse   . Alcohol abuse     Past Surgical History:  Procedure Laterality Date  . PACEMAKER PLACEMENT          Home Medications     Prior to Admission medications   Medication Sig Start Date End Date Taking? Authorizing Provider  cetirizine (ZYRTEC) 5 MG tablet Take 1 tablet (5 mg total) by mouth daily. 03/25/19 04/24/19  Eliezer Bottom, MD  clopidogrel (PLAVIX) 75 MG tablet Take 75 mg by mouth daily. 01/13/19   [provider]  divalproex (DEPAKOTE) 500 MG DR tablet Take 500 mg by mouth 2 (two) times a day. 06/30/18   [provider]  fluticasone (FLONASE) 50 MCG/ACT nasal spray Place 1 spray into both nostrils daily. 02/28/19   Jeannie Fend, PA-C  folic acid (FOLVITE) 1 MG tablet Take 1 tablet (1 mg total) by mouth daily. 01/21/19   Narda Bonds, MD  furosemide (LASIX) 80 MG tablet Take 80 mg by mouth daily. 09/13/18   [provider]  HUMALOG MIX 75/25 KWIKPEN (75-25) 100 UNIT/ML Kwikpen Inject 60 Units into the skin 3 (three) times daily before meals.  01/13/19   [provider]  metoprolol succinate (TOPROL-XL) 100 MG 24 hr tablet Take 150 mg by mouth every morning.  09/13/18   [provider]  pantoprazole (PROTONIX) 40 MG tablet Take 40 mg by mouth daily. 09/23/18   [provider]  prazosin (MINIPRESS) 2 MG capsule Take 2 mg by mouth at bedtime. 10/28/18   [provider]  rosuvastatin (CRESTOR) 20 MG tablet Take 20 mg by mouth daily. 09/21/18   [provider]  sertraline (  ZOLOFT) 50 MG tablet Take 50 mg by mouth 3 (three) times daily.  10/28/18   [provider]  traZODone (DESYREL) 100 MG tablet Take 100 mg by mouth at bedtime. 12/19/18   [provider]  VICTOZA 18 MG/3ML SOPN Inject 1.8 mg into the skin daily before breakfast.  01/09/19   [provider]    Family History Family History  Problem Relation Age of Onset  . Hypertension Mother   . Bone cancer Father   . Lung cancer Father     Social History Social History   Tobacco Use  . Smoking status: Current Every Day Smoker    Types: Cigarettes  . Smokeless  tobacco: Never Used  Substance Use Topics  . Alcohol use: Yes  . Drug use: Not Currently     Allergies   Aspirin, Iodine-131, Iodinated diagnostic agents, Latex, Penicillins, Amoxicillin-pot clavulanate, and Lisinopril   Review of Systems Review of Systems  Constitutional: Positive for chills and fever.  HENT: Positive for congestion and sore throat. Negative for ear pain, trouble swallowing and voice change.   Eyes: Negative for visual disturbance.  Respiratory: Positive for cough, shortness of breath and wheezing.   Cardiovascular: Positive for chest pain (w/ coughing otherwise none. ) and leg swelling (mild).  Gastrointestinal: Positive for diarrhea. Negative for abdominal pain, anal bleeding, blood in stool, constipation, nausea and vomiting.  Genitourinary: Negative for dysuria.  Neurological: Positive for headaches. Negative for dizziness, syncope, weakness, light-headedness and numbness.  All other systems reviewed and are negative.   Physical Exam Updated Vital Signs BP 122/64 (BP Location: Left Arm)   Pulse 83   Temp 99 F (37.2 C) (Oral)   Resp 18   Ht 5\' 5"  (1.651 m)   Wt (!) 140.2 kg   SpO2 93%   BMI 51.42 kg/m   Physical Exam Vitals signs and nursing note reviewed.  Constitutional:      Appearance: He is obese. He is not toxic-appearing.  HENT:     Head: Normocephalic and atraumatic.     Right Ear: Ear canal normal. Tympanic membrane is not erythematous, retracted or bulging.     Left Ear: Ear canal normal. Tympanic membrane is not erythematous, retracted or bulging.     Nose: Congestion present.     Right Sinus: No maxillary sinus tenderness or frontal sinus tenderness.     Left Sinus: No maxillary sinus tenderness or frontal sinus tenderness.     Mouth/Throat:     Pharynx: Oropharynx is clear. Uvula midline. No uvula swelling.     Tonsils: No tonsillar exudate.     Comments: Lingula frenulum lesion- somewhat suspicious for aphthous ulcer.  Posterior  oropharynx is symmetric appearing. Patient tolerating own secretions without difficulty. No trismus. No drooling. No hot potato voice. No swelling beneath the tongue, submandibular compartment is soft.  Eyes:     General:        Right eye: No discharge.        Left eye: No discharge.     Conjunctiva/sclera: Conjunctivae normal.  Neck:     Musculoskeletal: Neck supple.  Cardiovascular:     Rate and Rhythm: Normal rate and regular rhythm.  Pulmonary:     Effort: No respiratory distress.     Comments: Expiratory wheezing with course breath sounds throughout. SpO2 85-92% on RA throughout my assessment @ rest, placed on 2L via [place].  Chest:     Chest wall: Tenderness (anterior chest wall, mild) present.  Abdominal:  General: There is no distension.     Palpations: Abdomen is soft.     Tenderness: There is no abdominal tenderness. There is no guarding or rebound.  Musculoskeletal:     Comments: Trace edema to lower legs.   Skin:    General: Skin is warm and dry.     Findings: No rash.  Neurological:     Mental Status: He is alert.     Comments: Clear speech.   Psychiatric:        Behavior: Behavior normal.    ED Treatments / Results  Labs (all labs ordered are listed, but only abnormal results are displayed) Labs Reviewed  COMPREHENSIVE METABOLIC PANEL - Abnormal; Notable for the following components:      Result Value   Glucose, Bld 178 (*)    BUN 5 (*)    Calcium 8.7 (*)    Albumin 3.4 (*)    All other components within normal limits  CULTURE, BLOOD (ROUTINE X 2)  CULTURE, BLOOD (ROUTINE X 2)  SARS CORONAVIRUS 2 (TAT 6-24 HRS)  CBC WITH DIFFERENTIAL/PLATELET  BRAIN NATRIURETIC PEPTIDE    EKG None  Radiology Dg Chest Portable 1 View  Result Date: 05/04/2019 CLINICAL DATA:  Shortness of breath and cough.  Fever. EXAM: PORTABLE CHEST 1 VIEW COMPARISON:  January 17, 2019 FINDINGS: No edema or consolidation. There is cardiomegaly with pulmonary vascularity within normal  limits. Pacemaker leads are attached to the right atrium, right ventricle, and coronary sinus. No adenopathy. No bone lesions. IMPRESSION: Cardiomegaly. Pacemaker leads attached to right atrium, right ventricle, and coronary sinus. No edema or consolidation. Electronically Signed   By: Bretta BangWilliam  Woodruff III M.D.   On: 05/04/2019 12:09    Procedures .Critical Care Performed by: Cherly AndersonPetrucelli, Elham Fini R, PA-C Authorized by: Cherly AndersonPetrucelli, Toddy Boyd R, PA-C     (including critical care time)  Medications Ordered in ED Medications - No data to display   Initial Impression / Assessment and Plan / ED Course  I have reviewed the triage vital signs and the nursing notes.  Pertinent labs & imaging results that were available during my care of the patient were reviewed by me and considered in my medical decision making (see chart for details).   Patient presents to the ED w/ URI sxs, respiratory sxs, & diarrhea with associated fever with reported temp max of 103. Patient is nontoxic appearing, vitals notable for hypoxia- desaturated and remained in the 80s frequently throughout assessment- placed on 2L via [place] w/ improvement. Expiratory wheezing w/ course breath sounds throughout.  Plan for labs to include blood cultures, CXR, EKG. Trial albuterol. Anticipate admission.   CBC: No anemia or leukocytosis CMP: Hyperglycemia without acidosis or anion gap elevation.  Mild hypocalcemia, no significant electrolyte derangement. CXR: Cardiomegaly. Pacemaker leads attached to right atrium, right ventricle, and coronary sinus. No edema or consolidation  CXR w/o infiltrate to suggest CAP.  Cardiomegaly without edema, BNP WNL- dout CHF exacerbation.   Small amount of fluids & tylenol given.  Concern for COVID 19, results pending.  Given new oxygen requirement will consult for admission. Patient is in agreement  Findings and plan of care discussed with supervising physician Dr. Jeraldine LootsLockwood who has evaluated patient &  is in agreement.   Discussed with internal medicine residency service- accepts admission.    Final Clinical Impressions(s) / ED Diagnoses   Final diagnoses:  Acute respiratory failure with hypoxia [facility](HCC)    ED Discharge Orders    None  Amaryllis Dyke, PA-C 05/04/19 1457  Treyden Hakim was evaluated in Emergency Department on 05/04/2019 for the symptoms described in the history of present illness. He/she was evaluated in the context of the global COVID-19 pandemic, which necessitated consideration that the patient might be at risk for infection with the SARS-CoV-2 virus that causes COVID-19. Institutional protocols and algorithms that pertain to the evaluation of patients at risk for COVID-19 are in a state of rapid change based on information released by regulatory bodies including the CDC and federal and state organizations. These policies and algorithms were followed during the patient's care in the ED.     Amaryllis Dyke, PA-C 05/04/19 1457    Carmin Muskrat, MD 05/05/19 1118

## 2019-05-05 DIAGNOSIS — Z886 Allergy status to analgesic agent status: Secondary | ICD-10-CM | POA: Diagnosis not present

## 2019-05-05 DIAGNOSIS — E66813 Obesity, class 3: Secondary | ICD-10-CM | POA: Diagnosis present

## 2019-05-05 DIAGNOSIS — E785 Hyperlipidemia, unspecified: Secondary | ICD-10-CM

## 2019-05-05 DIAGNOSIS — K219 Gastro-esophageal reflux disease without esophagitis: Secondary | ICD-10-CM | POA: Diagnosis present

## 2019-05-05 DIAGNOSIS — G4733 Obstructive sleep apnea (adult) (pediatric): Secondary | ICD-10-CM

## 2019-05-05 DIAGNOSIS — Z91040 Latex allergy status: Secondary | ICD-10-CM | POA: Diagnosis not present

## 2019-05-05 DIAGNOSIS — E119 Type 2 diabetes mellitus without complications: Secondary | ICD-10-CM | POA: Diagnosis present

## 2019-05-05 DIAGNOSIS — Z20822 Contact with and (suspected) exposure to covid-19: Secondary | ICD-10-CM | POA: Diagnosis present

## 2019-05-05 DIAGNOSIS — Z888 Allergy status to other drugs, medicaments and biological substances status: Secondary | ICD-10-CM | POA: Diagnosis not present

## 2019-05-05 DIAGNOSIS — F17210 Nicotine dependence, cigarettes, uncomplicated: Secondary | ICD-10-CM | POA: Diagnosis present

## 2019-05-05 DIAGNOSIS — F329 Major depressive disorder, single episode, unspecified: Secondary | ICD-10-CM | POA: Diagnosis present

## 2019-05-05 DIAGNOSIS — Z794 Long term (current) use of insulin: Secondary | ICD-10-CM | POA: Diagnosis not present

## 2019-05-05 DIAGNOSIS — B348 Other viral infections of unspecified site: Secondary | ICD-10-CM | POA: Diagnosis present

## 2019-05-05 DIAGNOSIS — I5022 Chronic systolic (congestive) heart failure: Secondary | ICD-10-CM | POA: Diagnosis present

## 2019-05-05 DIAGNOSIS — I428 Other cardiomyopathies: Secondary | ICD-10-CM

## 2019-05-05 DIAGNOSIS — Z880 Allergy status to penicillin: Secondary | ICD-10-CM | POA: Diagnosis not present

## 2019-05-05 DIAGNOSIS — Z79899 Other long term (current) drug therapy: Secondary | ICD-10-CM | POA: Diagnosis not present

## 2019-05-05 DIAGNOSIS — L98499 Non-pressure chronic ulcer of skin of other sites with unspecified severity: Secondary | ICD-10-CM

## 2019-05-05 DIAGNOSIS — B9789 Other viral agents as the cause of diseases classified elsewhere: Secondary | ICD-10-CM | POA: Diagnosis present

## 2019-05-05 DIAGNOSIS — F4310 Post-traumatic stress disorder, unspecified: Secondary | ICD-10-CM

## 2019-05-05 DIAGNOSIS — Z7902 Long term (current) use of antithrombotics/antiplatelets: Secondary | ICD-10-CM | POA: Diagnosis not present

## 2019-05-05 DIAGNOSIS — Z20828 Contact with and (suspected) exposure to other viral communicable diseases: Secondary | ICD-10-CM | POA: Diagnosis present

## 2019-05-05 DIAGNOSIS — I110 Hypertensive heart disease with heart failure: Secondary | ICD-10-CM | POA: Diagnosis present

## 2019-05-05 DIAGNOSIS — J9601 Acute respiratory failure with hypoxia: Principal | ICD-10-CM

## 2019-05-05 DIAGNOSIS — Z950 Presence of cardiac pacemaker: Secondary | ICD-10-CM | POA: Diagnosis not present

## 2019-05-05 DIAGNOSIS — I10 Essential (primary) hypertension: Secondary | ICD-10-CM | POA: Diagnosis not present

## 2019-05-05 DIAGNOSIS — J069 Acute upper respiratory infection, unspecified: Secondary | ICD-10-CM | POA: Diagnosis present

## 2019-05-05 DIAGNOSIS — Z8249 Family history of ischemic heart disease and other diseases of the circulatory system: Secondary | ICD-10-CM | POA: Diagnosis not present

## 2019-05-05 DIAGNOSIS — F339 Major depressive disorder, recurrent, unspecified: Secondary | ICD-10-CM

## 2019-05-05 DIAGNOSIS — F419 Anxiety disorder, unspecified: Secondary | ICD-10-CM | POA: Diagnosis present

## 2019-05-05 LAB — GLUCOSE, CAPILLARY
Glucose-Capillary: 210 mg/dL — ABNORMAL HIGH (ref 70–99)
Glucose-Capillary: 188 mg/dL — ABNORMAL HIGH (ref 70–99)
Glucose-Capillary: 105 mg/dL — ABNORMAL HIGH (ref 70–99)
Glucose-Capillary: 214 mg/dL — ABNORMAL HIGH (ref 70–99)

## 2019-05-05 LAB — TROPONIN I (HIGH SENSITIVITY): Troponin I (High Sensitivity): 18 ng/L — ABNORMAL HIGH (ref <18)

## 2019-05-05 MED ORDER — BOOST / RESOURCE BREEZE PO LIQD CUSTOM
1.0000 | Freq: Three times a day (TID) | ORAL | Status: DC
  Administered 2019-05-05 – 2019-05-06 (×4): 1 via ORAL
  Filled 2019-05-05: qty 1

## 2019-05-05 MED ORDER — PRO-STAT SUGAR FREE PO LIQD
30.0000 mL | Freq: Two times a day (BID) | ORAL | Status: DC
  Administered 2019-05-05 – 2019-05-06 (×3): 30 mL via ORAL
  Filled 2019-05-05: qty 30

## 2019-05-05 MED ORDER — ENOXAPARIN SODIUM 80 MG/0.8ML ~~LOC~~ SOLN
70.0000 mg | SUBCUTANEOUS | Status: DC
  Administered 2019-05-05 – 2019-05-06 (×2): 70 mg via SUBCUTANEOUS
  Filled 2019-05-05 (×2): qty 0.8

## 2019-05-05 MED ORDER — ACETAMINOPHEN 325 MG PO TABS
650.0000 mg | ORAL_TABLET | Freq: Four times a day (QID) | ORAL | Status: DC | PRN
  Administered 2019-05-05 – 2019-05-06 (×3): 650 mg via ORAL
  Filled 2019-05-05 (×3): qty 2

## 2019-05-05 MED ORDER — FUROSEMIDE 80 MG PO TABS
80.0000 mg | ORAL_TABLET | Freq: Every day | ORAL | Status: DC
  Administered 2019-05-05 – 2019-05-07 (×3): 80 mg via ORAL
  Filled 2019-05-05 (×3): qty 1

## 2019-05-05 MED ORDER — ADULT MULTIVITAMIN W/MINERALS CH
1.0000 | ORAL_TABLET | Freq: Every day | ORAL | Status: DC
  Administered 2019-05-05 – 2019-05-07 (×3): 1 via ORAL
  Filled 2019-05-05 (×3): qty 1

## 2019-05-05 MED ORDER — BENZONATATE 100 MG PO CAPS
100.0000 mg | ORAL_CAPSULE | Freq: Two times a day (BID) | ORAL | Status: DC | PRN
  Administered 2019-05-05 – 2019-05-07 (×3): 100 mg via ORAL
  Filled 2019-05-05 (×3): qty 1

## 2019-05-05 MED ORDER — BENZOCAINE 10 % MT GEL
Freq: Two times a day (BID) | OROMUCOSAL | Status: DC | PRN
  Filled 2019-05-05: qty 9

## 2019-05-05 NOTE — Progress Notes (Signed)
Initial Nutrition Assessment  DOCUMENTATION CODES:   Morbid obesity  INTERVENTION:  - will order Boost Breeze TID, each supplement provides 250 kcal and 9 grams of protein. - will order 30 mL Prostat BID, each supplement provides 100 kcal and 15 grams of protein. - will order daily multivitamin with minerals.  - continue to encourage PO intakes.   NUTRITION DIAGNOSIS:   Inadequate oral intake related to acute illness, poor appetite as evidenced by per patient/family report.  GOAL:   Patient will meet greater than or equal to 90% of their needs  MONITOR:   PO intake, Supplement acceptance, Labs, Weight trends  REASON FOR ASSESSMENT:   Malnutrition Screening Tool  ASSESSMENT:   48 year old male with medical history of type 2 DM, morbid obesity, OSA, HTN, hyperlipidemia, and PTSD. He presented to the ED with myalgias, fever, SOB, chills, cough, headache, diarrhea, nasal congestion, sore throat, chest pain, and body aches for 3 days. He reported fever of up to 103 degrees at home. No N/V or abdominal pain.  No intakes documented since admission. Patient reports feeling unwell x4-5 days PTA and limited ability to eat and drink during that time d/t difficulty keeping items down. He reports eating some of scrambled eggs for breakfast, carrots for lunch, and that he recently ate a banana. He was able to keep these items down without issue. He still has a poor appetite but has been focusing on drinking more fluids and states that urine is lightening. He confirms ongoing sore throat and that he drank hot tea last night. Also reports some degree of abdominal discomfort, which is improving.  Per chart review, current weight is 309 lb and weight on 9/26 was 319 lb. This indicates 10 lb weight loss (3% body weight) in the past 5 weeks; unable to determine if weight loss occurred more acutely.     Labs reviewed; CBGs: 210 and 188 mg/dl, BUN: 5 mg/dl, Ca: 8.7 mg/dl. Medications reviewed; 1 mg  folvite/day, 80 mg oral lasix/day, sliding scale novolog, 6 units novolog TID, 35 units lantus BID, 40 mg oral protonix/day.    NUTRITION - FOCUSED PHYSICAL EXAM:  completed; no muscle or fat wasting, no edema noted at this time.   Diet Order:   Diet Order            Diet Heart Room service appropriate? Yes; Fluid consistency: Thin  Diet effective now              EDUCATION NEEDS:   No education needs have been identified at this time  Skin:  Skin Assessment: Reviewed RN Assessment  Last BM:  PTA/unknown  Height:   Ht Readings from Last 1 Encounters:  05/04/19 5\' 5"  (1.651 m)    Weight:   Wt Readings from Last 1 Encounters:  05/04/19 (!) 140.2 kg    Ideal Body Weight:  61.8 kg  BMI:  Body mass index is 51.42 kg/m.  Estimated Nutritional Needs:   Kcal:  2100-2300 kcal  Protein:  105-115 grams  Fluid:  >/= 2.2 L/day      Jarome Matin, MS, RD, LDN, [facility] Inpatient Clinical Dietitian Pager # [phone] After hours/weekend pager # [phone]

## 2019-05-05 NOTE — Progress Notes (Signed)
   Subjective:   Patient was laying down in his bed this morning. He states that he is not better from yesterday. He states that he is still having loose bowel movements.   Objective:  Vital signs in last 24 hours: Vitals:   05/04/19 2051 05/05/19 0154 05/05/19 0449 05/05/19 0625  BP: (!) 132/94 139/65  136/73  Pulse: 77 74 72 73  Resp: 20 16 20 17   Temp:  (!) 97.5 F (36.4 C)  97.6 F (36.4 C)  TempSrc:  Oral  Oral  SpO2: 96% 98% 95% 96%  Weight:      Height:       Physical Exam  Constitutional: He appears well-developed and well-nourished. No distress.  HENT:  Head: Normocephalic and atraumatic.  Small ulcer noted sublingually on frenum lingulae  Eyes: Conjunctivae are normal.  Cardiovascular: Normal rate and regular rhythm.  Respiratory: Effort normal. No respiratory distress. He has no wheezes. He has rales (diffuse).  GI: Soft. Bowel sounds are normal. He exhibits no distension. There is no abdominal tenderness.  Musculoskeletal:        General: No edema.  Skin: He is not diaphoretic.  Psychiatric: He has a normal mood and affect. His behavior is normal. Judgment and thought content normal.   Assessment/Plan:  Mr. Kathryne Hitch is a 48 y.o male with OSA, HTN, HLD PTSD who presented with 3 days of upper respiratory symptoms (fever/chills, cough, headache, nasal congestion, sore throat).   Acute hypoxic respiratory failure Afebrile, has not used any tylenol since admission into hospital. Patient subjectively still not feeling well.   RVP showing rhinovirus positive. COVID19 negative.  -flutter valve -continue monitoring oxygen  -supportive care   HTN Patient's blood pressure has ranged 120-130s/60-70s.   -continue metoprolol 150mg  qd  NICM -resumed lasix 80mg  qd   HLD -continue rosuvastatin   MDD, PTSD -continue sertraline -continue depakote -continue trazodone   Dispo: Anticipated discharge in approximately 1 day(s).   Lars Mage, MD Internal  Medicine PGY3 [id number]:[phone] 05/05/2019, 1:18 PM

## 2019-05-06 DIAGNOSIS — B348 Other viral infections of unspecified site: Secondary | ICD-10-CM

## 2019-05-06 LAB — BASIC METABOLIC PANEL
Anion gap: 9 (ref 5–15)
BUN: 7 mg/dL (ref 6–20)
CO2: 26 mmol/L (ref 22–32)
Calcium: 8.7 mg/dL — ABNORMAL LOW (ref 8.9–10.3)
Chloride: 105 mmol/L (ref 98–111)
Creatinine, Ser: 0.87 mg/dL (ref 0.61–1.24)
GFR calc Af Amer: >60 mL/min (ref >60)
GFR calc non Af Amer: >60 mL/min (ref >60)
Glucose, Bld: 153 mg/dL — ABNORMAL HIGH (ref 70–99)
Potassium: 3.7 mmol/L (ref 3.5–5.1)
Sodium: 140 mmol/L (ref 135–145)

## 2019-05-06 LAB — GLUCOSE, CAPILLARY
Glucose-Capillary: 178 mg/dL — ABNORMAL HIGH (ref 70–99)
Glucose-Capillary: 229 mg/dL — ABNORMAL HIGH (ref 70–99)
Glucose-Capillary: 141 mg/dL — ABNORMAL HIGH (ref 70–99)
Glucose-Capillary: 146 mg/dL — ABNORMAL HIGH (ref 70–99)

## 2019-05-06 LAB — CBC
HCT: 41.7 % (ref 39.0–52.0)
Hemoglobin: 13.1 g/dL (ref 13.0–17.0)
MCH: 28.3 pg (ref 26.0–34.0)
MCHC: 31.4 g/dL (ref 30.0–36.0)
MCV: 90.1 fL (ref 80.0–100.0)
Platelets: 241 10*3/uL (ref 150–400)
RBC: 4.63 MIL/uL (ref 4.22–5.81)
RDW: 13.7 % (ref 11.5–15.5)
WBC: 9 10*3/uL (ref 4.0–10.5)
nRBC: 0 % (ref 0.0–0.2)

## 2019-05-06 NOTE — Progress Notes (Signed)
   Subjective:  No acute events overnight.   Objective:  Vital signs in last 24 hours: Vitals:   05/05/19 2039 05/06/19 0013 05/06/19 0632 05/06/19 0856  BP:  126/60 (!) 157/80   Pulse: 83 69 67   Resp: 18 16 18    Temp:  (!) 97.4 F (36.3 C) (!) 97.5 F (36.4 C)   TempSrc:  Oral Oral   SpO2: 92% 92% 95% 94%  Weight:      Height:       Physical exam General: In no acute distress Cardiac: Heart regular rate and rhythm Pulmonary: Lung sounds clear Abdomen: Bowel sounds active Skin: No rash  Assessment/Plan:  [name] is a 48 y.o male with OSA, HTN, HLD PTSD who presented with 3 days of upper respiratory symptoms (fever/chills, cough, headache, nasal congestion, sore throat).   Rhinovirus. Afebrile.  Vital signs stable.  Breathing comfortably on room air.  Ambulating O2 sats maintained between 91 to 94% on room air Plan: Continue supportive measures.  Tessalon Perles for cough  HTN stable. Plan: Continue metoprolol 150mg  qd NICM. continue lasix 80mg  qd  HLD. continue rosuvastatin   MDD, PTSD. continue sertraline, depakote, trazodone   Dispo: Likely stable for discharge today however patient is requesting to stay 1 more day until his sister arrives to help him at home as he is still feeling somewhat unsteady on his feet. Will discuss with attending with further recs to follow.  Mitzi Hansen, MD Internal Medicine Resident PGY1 05/06/19 9:34 AM

## 2019-05-06 NOTE — Plan of Care (Signed)
  Problem: Education: Goal: Knowledge of General Education information will improve Description: Including pain rating scale, medication(s)/side effects and non-pharmacologic comfort measures Outcome: Progressing   Problem: Clinical Measurements: Goal: Will remain free from infection Outcome: Progressing   Problem: Clinical Measurements: Goal: Respiratory complications will improve Outcome: Progressing   Problem: Safety: Goal: Ability to remain free from injury will improve Outcome: Progressing   

## 2019-05-06 NOTE — Progress Notes (Signed)
Ambulated in hall with no oxygen supplementation.  Maintained O2 sats between 91-94.

## 2019-05-06 NOTE — Evaluation (Signed)
Physical Therapy Evaluation Patient Details Name: Luis [name] MRN: [id number] DOB: 1971-02-09 Today's Date: 05/06/2019   History of Present Illness  48 y.o male with OSA, HTN, HLD PTSD who presented with 3 days of upper respiratory symptoms (fever/chills, cough, headache, nasal congestion, sore throat).    Clinical Impression  PT eval complete. Pt independent to supervision with all mobility. Ambulated 200 feet without AD on RA, SpO2 94%. No further PT intervention indicated. PT signing off.     Follow Up Recommendations No PT follow up;Supervision - Intermittent    Equipment Recommendations  None recommended by PT    Recommendations for Other Services       Precautions / Restrictions Precautions Precautions: None      Mobility  Bed Mobility Overal bed mobility: Modified Independent                Transfers Overall transfer level: Independent Equipment used: None                Ambulation/Gait Ambulation/Gait assistance: Supervision Gait Distance (Feet): 225 Feet Assistive device: None Gait Pattern/deviations: Step-through pattern;Decreased stride length Gait velocity: mildly decreased Gait velocity interpretation: 1.31 - 2.62 ft/sec, indicative of limited community ambulator General Gait Details: Ambulated on RA with SpO2 94%. Supervision for safety. No physical assist. No LOB noted.  Stairs            Wheelchair Mobility    Modified Rankin (Stroke Patients Only)       Balance Overall balance assessment: Mild deficits observed, not formally tested                                           Pertinent Vitals/Pain Pain Assessment: No/denies pain    Home Living Family/patient expects to be discharged to:: Private residence Living Arrangements: Alone Available Help at Discharge: Family;Available PRN/intermittently Type of Home: Apartment Home Access: Level entry     Home Layout: One level Home Equipment: Grab  bars - tub/shower;Grab bars - toilet      Prior Function Level of Independence: Independent               Hand Dominance        Extremity/Trunk Assessment   Upper Extremity Assessment Upper Extremity Assessment: Overall WFL for tasks assessed    Lower Extremity Assessment Lower Extremity Assessment: Overall WFL for tasks assessed    Cervical / Trunk Assessment Cervical / Trunk Assessment: Normal  Communication   Communication: No difficulties  Cognition Arousal/Alertness: Awake/alert Behavior During Therapy: WFL for tasks assessed/performed Overall Cognitive Status: Within Functional Limits for tasks assessed                                        General Comments      Exercises     Assessment/Plan    PT Assessment Patent does not need any further PT services  PT Problem List         PT Treatment Interventions      PT Goals (Current goals can be found in the Care Plan section)  Acute Rehab PT Goals Patient Stated Goal: feel better PT Goal Formulation: All assessment and education complete, DC therapy    Frequency     Barriers to discharge        Co-evaluation  AM-PAC PT "6 Clicks" Mobility  Outcome Measure Help needed turning from your back to your side while in a flat bed without using bedrails?: None Help needed moving from lying on your back to sitting on the side of a flat bed without using bedrails?: None Help needed moving to and from a bed to a chair (including a wheelchair)?: None Help needed standing up from a chair using your arms (e.g., wheelchair or bedside chair)?: None Help needed to walk in hospital room?: None Help needed climbing 3-5 steps with a railing? : A Little 6 Click Score: 23    End of Session   Activity Tolerance: Patient tolerated treatment well Patient left: in bed;with call bell/phone within reach Nurse Communication: Mobility status PT Visit Diagnosis: Unsteadiness on feet  (R26.81)    Time: 7622-6333 PT Time Calculation (min) (ACUTE ONLY): 12 min   Charges:   PT Evaluation $PT Eval Low Complexity: 1 Low          Lorrin Goodell, PT  Office # [phone] Pager [phone]   Lorriane Shire 05/06/2019, 11:54 AM

## 2019-05-07 LAB — GLUCOSE, CAPILLARY
Glucose-Capillary: 109 mg/dL — ABNORMAL HIGH (ref 70–99)
Glucose-Capillary: 201 mg/dL — ABNORMAL HIGH (ref 70–99)

## 2019-05-07 MED ORDER — BENZONATATE 100 MG PO CAPS: 100.0000 mg | ORAL_CAPSULE | Freq: Two times a day (BID) | ORAL | 0 refills | Status: DC | PRN

## 2019-05-07 NOTE — Discharge Summary (Signed)
Name: Luis [name] MRN: [id number] DOB: January 21, 1971 48 y.o. PCP: System, Pcp Not In  Date of Admission: 05/04/2019  9:58 AM Date of Discharge: 05/07/19 Attending Physician: No att. providers found  Discharge Diagnosis: 1. Acute hypoxic respiratory failure secondary to rhinovirus   Discharge Medications: Allergies as of 05/07/2019      Reactions   Aspirin Anaphylaxis   Throat closing   Iodine-131 Hives   Hives   Iodinated Diagnostic Agents Hives   Latex Rash, Hives   Penicillins Nausea And Vomiting   Amoxicillin-pot Clavulanate Diarrhea   Lisinopril    Other reaction(s): Cough      Medication List    TAKE these medications   benzonatate 100 MG capsule Commonly known as: TESSALON Take 1 capsule (100 mg total) by mouth 2 (two) times daily as needed for cough.   cetirizine 5 MG tablet Commonly known as: ZYRTEC Take 1 tablet (5 mg total) by mouth daily.   clopidogrel 75 MG tablet Commonly known as: PLAVIX Take 75 mg by mouth daily.   divalproex 500 MG DR tablet Commonly known as: DEPAKOTE Take 500 mg by mouth 2 (two) times a day.   fluticasone 50 MCG/ACT nasal spray Commonly known as: FLONASE Place 1 spray into both nostrils daily.   folic acid 1 MG tablet Commonly known as: FOLVITE Take 1 tablet (1 mg total) by mouth daily.   furosemide 80 MG tablet Commonly known as: LASIX Take 80 mg by mouth daily.   HumaLOG Mix 75/25 KwikPen (75-25) 100 UNIT/ML Kwikpen Generic drug: Insulin Lispro Prot & Lispro Inject 60 Units into the skin 3 (three) times daily before meals.   metoprolol succinate 100 MG 24 hr tablet Commonly known as: TOPROL-XL Take 150 mg by mouth every morning.   pantoprazole 40 MG tablet Commonly known as: PROTONIX Take 40 mg by mouth daily.   potassium chloride 10 MEQ tablet Commonly known as: KLOR-CON Take 10 mEq by mouth daily.   prazosin 2 MG capsule Commonly known as: MINIPRESS Take 2 mg by mouth at bedtime.   rosuvastatin  20 MG tablet Commonly known as: CRESTOR Take 20 mg by mouth daily.   sertraline 50 MG tablet Commonly known as: ZOLOFT Take 50 mg by mouth 3 (three) times daily.   traZODone 100 MG tablet Commonly known as: DESYREL Take 100 mg by mouth at bedtime.   Victoza 18 MG/3ML Sopn Generic drug: liraglutide Inject 1.8 mg into the skin daily before breakfast.       Disposition and follow-up:   Mr.Luis [name] was discharged from [facility] in Stable condition.  At the hospital follow up visit please address:  1.  Acute respiratory failure secondary to rhinovirus. Discharged on room air. Please ensure respiratory symptoms have continued to improve.   2.  Labs / imaging needed at time of follow-up: none  3.  Pending labs/ test needing follow-up: none  Follow-up Appointments: PCP 5-7d after discharge   Hospital Course by problem list: 48 yo male who presented with myalgias, fever, cough and shortness of breath and found to be hypoxic on admission. COVID neg. RVP + rhinovirus. Initially required 2L [place] but was subsequently weaned to room air 2d prior to discharge. Ambulating O2 saturations on day prior to discharge were stable at 92-96% RA. Discharged home in stable condition with instruction to follow-up with PCP within the week and to continue supportive care for cough.   Discharge Vitals:   BP 127/60 (BP Location: Right Arm)  Pulse 69   Temp 97.7 F (36.5 C) (Oral)   Resp 20   Ht 5\' 5"  (1.651 m)   Wt (!) 140.2 kg   SpO2 93%   BMI 51.42 kg/m   Pertinent Labs, Studies, and Procedures:  CXR negative for infiltrate on admission.  Discharge Instructions: Discharge Instructions    Diet - low sodium heart healthy   Complete by: As directed    Discharge instructions   Complete by: As directed    F/u with pcp in 5-7d   Increase activity slowly   Complete by: As directed       Signed: D, DO 05/08/2019, 9:59 AM

## 2019-05-07 NOTE — Plan of Care (Signed)
  Problem: Education: Goal: Knowledge of General Education information will improve Description: Including pain rating scale, medication(s)/side effects and non-pharmacologic comfort measures Outcome: Progressing   Problem: Clinical Measurements: Goal: Respiratory complications will improve Outcome: Progressing   

## 2019-05-07 NOTE — Discharge Instructions (Signed)
Thank you for allowing the internal medicine team to participate in your care during your hospitalization! The shortness of breath that you experienced was likely from the rhinovirus which is one of the major causes of the common cold. I am happy to see that you are doing better.  Please follow up with your PCP in 5-7 days. The cough that you have is likely what we call a post viral cough (meaning occurring after a viral infection). These coughs can linger for several weeks but should slowly improve. You can try over the counter cough medications like Mucinex if it is keeping you up at night.  Take care!

## 2019-05-07 NOTE — Progress Notes (Signed)
   Subjective:  No acute events overnight. Patient endorses continued cough. Breathing is back at baseline. He states his daughter will be home to help take care of him. Stable for discharge today.   Objective:  Vital signs in last 24 hours: Vitals:   05/06/19 0856 05/07/19 0001 05/07/19 0627 05/07/19 1000  BP:  (!) 146/66 126/60 127/60  Pulse:  60 60 69  Resp:  18 20   Temp:  97.6 F (36.4 C) 97.7 F (36.5 C)   TempSrc:  Oral Oral   SpO2: 94% 94% 95% 93%  Weight:      Height:       Physical exam General: In no acute distress Cardiac: RRR Pulmonary: normal work of breathing; lungs CTAB Abdomen: non distended, non-tender Skin: No rash  Assessment/Plan:  Mr. Kathryne Hitch is a 48 y.o male with OSA, HTN, HLD PTSD who presented with 3 days of upper respiratory symptoms (fever/chills, cough, headache, nasal congestion, sore throat).   Acute hypoxic respiratory failure secondary to Rhinovirus. Afebrile.  Vital signs stable.  Weaned successfully to room air and was able to maintain O2 sats with ambulation. He is stable for discharge home today with continued supportive measures for cough.    HTN stable; Continue metoprolol 150mg  qd NICM. continue lasix 80mg  qd  HLD. continue rosuvastatin  MDD, PTSD. continue sertraline, depakote, trazodone   Dispo: Patient stable for discharge home today.  Delice Bison, DO  PGY-2 05/07/19 10:53 AM

## 2019-05-09 LAB — CULTURE, BLOOD (ROUTINE X 2)
Culture: NO GROWTH
Culture: NO GROWTH
Special Requests: ADEQUATE
Special Requests: ADEQUATE

## 2019-05-15 ENCOUNTER — Ambulatory Visit (HOSPITAL_COMMUNITY): Admission: RE | Admit: 2019-05-15 | Payer: Medicare Other | Source: Ambulatory Visit

## 2019-05-26 ENCOUNTER — Ambulatory Visit (HOSPITAL_COMMUNITY): Admission: RE | Admit: 2019-05-26 | Payer: Medicare Other | Source: Ambulatory Visit

## 2019-05-26 ENCOUNTER — Encounter (HOSPITAL_COMMUNITY): Payer: Self-pay

## 2019-05-26 ENCOUNTER — Telehealth (HOSPITAL_COMMUNITY): Payer: Self-pay | Admitting: *Deleted

## 2019-05-26 NOTE — Telephone Encounter (Signed)
Roe Coombs RN called and left message that he would need to reschedule MRI since they were to be here at 1130 for their appointment

## 2019-07-26 ENCOUNTER — Observation Stay (HOSPITAL_BASED_OUTPATIENT_CLINIC_OR_DEPARTMENT_OTHER): Payer: Medicare Other

## 2019-07-26 ENCOUNTER — Other Ambulatory Visit: Payer: Self-pay

## 2019-07-26 ENCOUNTER — Emergency Department (HOSPITAL_COMMUNITY): Payer: Medicare Other

## 2019-07-26 ENCOUNTER — Encounter (HOSPITAL_COMMUNITY): Payer: Self-pay | Admitting: Emergency Medicine

## 2019-07-26 ENCOUNTER — Observation Stay (HOSPITAL_COMMUNITY)
Admission: EM | Admit: 2019-07-26 | Discharge: 2019-07-27 | Disposition: A | Discharge status: Home / Self Care | Payer: Medicare Other | Attending: Internal Medicine | Admitting: Internal Medicine

## 2019-07-26 DIAGNOSIS — I2510 Atherosclerotic heart disease of native coronary artery without angina pectoris: Secondary | ICD-10-CM | POA: Insufficient documentation

## 2019-07-26 DIAGNOSIS — K219 Gastro-esophageal reflux disease without esophagitis: Secondary | ICD-10-CM | POA: Diagnosis not present

## 2019-07-26 DIAGNOSIS — I10 Essential (primary) hypertension: Secondary | ICD-10-CM | POA: Diagnosis present

## 2019-07-26 DIAGNOSIS — Z794 Long term (current) use of insulin: Secondary | ICD-10-CM | POA: Diagnosis not present

## 2019-07-26 DIAGNOSIS — E785 Hyperlipidemia, unspecified: Secondary | ICD-10-CM | POA: Diagnosis not present

## 2019-07-26 DIAGNOSIS — G4733 Obstructive sleep apnea (adult) (pediatric): Secondary | ICD-10-CM | POA: Diagnosis not present

## 2019-07-26 DIAGNOSIS — I110 Hypertensive heart disease with heart failure: Secondary | ICD-10-CM | POA: Diagnosis not present

## 2019-07-26 DIAGNOSIS — Z6841 Body Mass Index (BMI) 40.0 and over, adult: Secondary | ICD-10-CM | POA: Insufficient documentation

## 2019-07-26 DIAGNOSIS — F1420 Cocaine dependence, uncomplicated: Secondary | ICD-10-CM | POA: Diagnosis present

## 2019-07-26 DIAGNOSIS — R079 Chest pain, unspecified: Principal | ICD-10-CM | POA: Diagnosis present

## 2019-07-26 DIAGNOSIS — Z95810 Presence of automatic (implantable) cardiac defibrillator: Secondary | ICD-10-CM | POA: Insufficient documentation

## 2019-07-26 DIAGNOSIS — Z9861 Coronary angioplasty status: Secondary | ICD-10-CM | POA: Diagnosis not present

## 2019-07-26 DIAGNOSIS — E1165 Type 2 diabetes mellitus with hyperglycemia: Secondary | ICD-10-CM | POA: Insufficient documentation

## 2019-07-26 DIAGNOSIS — Z79899 Other long term (current) drug therapy: Secondary | ICD-10-CM | POA: Diagnosis not present

## 2019-07-26 DIAGNOSIS — I5022 Chronic systolic (congestive) heart failure: Secondary | ICD-10-CM | POA: Diagnosis present

## 2019-07-26 DIAGNOSIS — Z7902 Long term (current) use of antithrombotics/antiplatelets: Secondary | ICD-10-CM | POA: Insufficient documentation

## 2019-07-26 DIAGNOSIS — F4310 Post-traumatic stress disorder, unspecified: Secondary | ICD-10-CM | POA: Insufficient documentation

## 2019-07-26 DIAGNOSIS — F1010 Alcohol abuse, uncomplicated: Secondary | ICD-10-CM | POA: Diagnosis not present

## 2019-07-26 DIAGNOSIS — I428 Other cardiomyopathies: Secondary | ICD-10-CM | POA: Diagnosis not present

## 2019-07-26 DIAGNOSIS — Z950 Presence of cardiac pacemaker: Secondary | ICD-10-CM

## 2019-07-26 DIAGNOSIS — F1410 Cocaine abuse, uncomplicated: Secondary | ICD-10-CM | POA: Diagnosis not present

## 2019-07-26 DIAGNOSIS — Z20822 Contact with and (suspected) exposure to covid-19: Secondary | ICD-10-CM | POA: Insufficient documentation

## 2019-07-26 DIAGNOSIS — D72829 Elevated white blood cell count, unspecified: Secondary | ICD-10-CM | POA: Diagnosis present

## 2019-07-26 DIAGNOSIS — F17210 Nicotine dependence, cigarettes, uncomplicated: Secondary | ICD-10-CM | POA: Insufficient documentation

## 2019-07-26 DIAGNOSIS — R0789 Other chest pain: Secondary | ICD-10-CM

## 2019-07-26 HISTORY — DX: Cocaine use, unspecified, uncomplicated: F14.90

## 2019-07-26 HISTORY — DX: Morbid (severe) obesity due to excess calories: E66.01

## 2019-07-26 HISTORY — DX: Reserved for inherently not codable concepts without codable children: IMO0001

## 2019-07-26 HISTORY — DX: Other cardiomyopathies: I42.8

## 2019-07-26 HISTORY — DX: Presence of automatic (implantable) cardiac defibrillator: Z95.810

## 2019-07-26 HISTORY — DX: Obstructive sleep apnea (adult) (pediatric): G47.33

## 2019-07-26 HISTORY — DX: Procedure and treatment not carried out because of patient's decision for reasons of belief and group pressure: Z53.1

## 2019-07-26 HISTORY — DX: Chronic systolic (congestive) heart failure: I50.22

## 2019-07-26 HISTORY — DX: Tobacco use: Z72.0

## 2019-07-26 LAB — BASIC METABOLIC PANEL
Anion gap: 11 (ref 5–15)
BUN: 5 mg/dL — ABNORMAL LOW (ref 6–20)
CO2: 24 mmol/L (ref 22–32)
Calcium: 8.9 mg/dL (ref 8.9–10.3)
Chloride: 103 mmol/L (ref 98–111)
Creatinine, Ser: 0.99 mg/dL (ref 0.61–1.24)
GFR calc Af Amer: >60 mL/min (ref >60)
GFR calc non Af Amer: >60 mL/min (ref >60)
Glucose, Bld: 107 mg/dL — ABNORMAL HIGH (ref 70–99)
Potassium: 3.5 mmol/L (ref 3.5–5.1)
Sodium: 138 mmol/L (ref 135–145)

## 2019-07-26 LAB — MAGNESIUM: Magnesium: 2.1 mg/dL (ref 1.7–2.4)

## 2019-07-26 LAB — CBC
HCT: 43.9 % (ref 39.0–52.0)
Hemoglobin: 14.5 g/dL (ref 13.0–17.0)
MCH: 28.8 pg (ref 26.0–34.0)
MCHC: 33 g/dL (ref 30.0–36.0)
MCV: 87.3 fL (ref 80.0–100.0)
Platelets: 253 10*3/uL (ref 150–400)
RBC: 5.03 MIL/uL (ref 4.22–5.81)
RDW: 14.4 % (ref 11.5–15.5)
WBC: 13.8 10*3/uL — ABNORMAL HIGH (ref 4.0–10.5)
nRBC: 0 % (ref 0.0–0.2)

## 2019-07-26 LAB — LIPID PANEL
Cholesterol: 114 mg/dL (ref 0–200)
HDL: 34 mg/dL — ABNORMAL LOW (ref >40)
LDL Cholesterol: 31 mg/dL (ref 0–99)
Total CHOL/HDL Ratio: 3.4 RATIO
Triglycerides: 244 mg/dL — ABNORMAL HIGH (ref <150)
VLDL: 49 mg/dL — ABNORMAL HIGH (ref 0–40)

## 2019-07-26 LAB — ECHOCARDIOGRAM COMPLETE

## 2019-07-26 LAB — CK: Total CK: 239 U/L (ref 49–397)

## 2019-07-26 LAB — RAPID URINE DRUG SCREEN, HOSP PERFORMED
Amphetamines: NOT DETECTED
Barbiturates: NOT DETECTED
Benzodiazepines: NOT DETECTED
Cocaine: POSITIVE — AB
Opiates: NOT DETECTED
Tetrahydrocannabinol: NOT DETECTED

## 2019-07-26 LAB — CBG MONITORING, ED
Glucose-Capillary: 100 mg/dL — ABNORMAL HIGH (ref 70–99)
Glucose-Capillary: 198 mg/dL — ABNORMAL HIGH (ref 70–99)

## 2019-07-26 LAB — HEMOGLOBIN A1C
Hgb A1c MFr Bld: 9.4 % — ABNORMAL HIGH (ref 4.8–5.6)
Mean Plasma Glucose: 223.08 mg/dL

## 2019-07-26 LAB — TROPONIN I (HIGH SENSITIVITY)
Troponin I (High Sensitivity): 14 ng/L (ref <18)
Troponin I (High Sensitivity): 8 ng/L (ref <18)

## 2019-07-26 LAB — SARS CORONAVIRUS 2 (TAT 6-24 HRS): SARS Coronavirus 2: NEGATIVE

## 2019-07-26 LAB — GLUCOSE, CAPILLARY: Glucose-Capillary: 76 mg/dL (ref 70–99)

## 2019-07-26 MED ORDER — SODIUM CHLORIDE 0.9 % IV SOLN: INTRAVENOUS | Status: DC

## 2019-07-26 MED ORDER — FOLIC ACID 1 MG PO TABS
1.0000 mg | ORAL_TABLET | Freq: Every day | ORAL | Status: DC
  Administered 2019-07-26 – 2019-07-27 (×2): 1 mg via ORAL
  Filled 2019-07-26 (×2): qty 1

## 2019-07-26 MED ORDER — SODIUM CHLORIDE 0.9% FLUSH: 3.0000 mL | Freq: Once | INTRAVENOUS | Status: DC

## 2019-07-26 MED ORDER — PANTOPRAZOLE SODIUM 40 MG PO TBEC
40.0000 mg | DELAYED_RELEASE_TABLET | Freq: Every day | ORAL | Status: DC
  Administered 2019-07-26 – 2019-07-27 (×2): 40 mg via ORAL
  Filled 2019-07-26 (×2): qty 1

## 2019-07-26 MED ORDER — MORPHINE SULFATE (PF) 2 MG/ML IV SOLN
2.0000 mg | INTRAVENOUS | Status: DC | PRN
  Administered 2019-07-26 – 2019-07-27 (×4): 2 mg via INTRAVENOUS
  Filled 2019-07-26 (×4): qty 1

## 2019-07-26 MED ORDER — TRAZODONE HCL 100 MG PO TABS
200.0000 mg | ORAL_TABLET | Freq: Every day | ORAL | Status: DC
  Administered 2019-07-26: 200 mg via ORAL
  Filled 2019-07-26: qty 2

## 2019-07-26 MED ORDER — ONDANSETRON HCL 4 MG/2ML IJ SOLN: 4.0000 mg | Freq: Four times a day (QID) | INTRAMUSCULAR | Status: DC | PRN

## 2019-07-26 MED ORDER — THIAMINE HCL 100 MG/ML IJ SOLN: 100.0000 mg | Freq: Every day | INTRAMUSCULAR | Status: DC

## 2019-07-26 MED ORDER — ACETAMINOPHEN 325 MG PO TABS
650.0000 mg | ORAL_TABLET | Freq: Once | ORAL | Status: AC
  Administered 2019-07-26: 05:00:00 650 mg via ORAL
  Filled 2019-07-26: qty 2

## 2019-07-26 MED ORDER — LORAZEPAM 2 MG/ML IJ SOLN: 1.0000 mg | INTRAMUSCULAR | Status: DC | PRN

## 2019-07-26 MED ORDER — ISOSORBIDE MONONITRATE ER 30 MG PO TB24
30.0000 mg | ORAL_TABLET | Freq: Every day | ORAL | Status: DC
  Administered 2019-07-26 – 2019-07-27 (×2): 30 mg via ORAL
  Filled 2019-07-26 (×2): qty 1

## 2019-07-26 MED ORDER — CLOPIDOGREL BISULFATE 75 MG PO TABS
75.0000 mg | ORAL_TABLET | Freq: Every day | ORAL | Status: DC
  Administered 2019-07-26 – 2019-07-27 (×2): 75 mg via ORAL
  Filled 2019-07-26 (×2): qty 1

## 2019-07-26 MED ORDER — POTASSIUM CHLORIDE CRYS ER 20 MEQ PO TBCR
40.0000 meq | EXTENDED_RELEASE_TABLET | Freq: Once | ORAL | Status: AC
  Administered 2019-07-26: 40 meq via ORAL
  Filled 2019-07-26: qty 2

## 2019-07-26 MED ORDER — ENOXAPARIN SODIUM 40 MG/0.4ML ~~LOC~~ SOLN
40.0000 mg | SUBCUTANEOUS | Status: DC
  Administered 2019-07-26 – 2019-07-27 (×2): 40 mg via SUBCUTANEOUS
  Filled 2019-07-26 (×2): qty 0.4

## 2019-07-26 MED ORDER — INSULIN ASPART PROT & ASPART (70-30 MIX) 100 UNIT/ML ~~LOC~~ SUSP
45.0000 [IU] | Freq: Three times a day (TID) | SUBCUTANEOUS | Status: DC
  Administered 2019-07-26 – 2019-07-27 (×4): 45 [IU] via SUBCUTANEOUS
  Filled 2019-07-26: qty 10

## 2019-07-26 MED ORDER — LORAZEPAM 1 MG PO TABS: 1.0000 mg | ORAL_TABLET | ORAL | Status: DC | PRN

## 2019-07-26 MED ORDER — ALUM & MAG HYDROXIDE-SIMETH 200-200-20 MG/5ML PO SUSP: 30.0000 mL | Freq: Four times a day (QID) | ORAL | Status: DC | PRN

## 2019-07-26 MED ORDER — DIVALPROEX SODIUM 250 MG PO DR TAB
500.0000 mg | DELAYED_RELEASE_TABLET | Freq: Two times a day (BID) | ORAL | Status: DC
  Administered 2019-07-26 – 2019-07-27 (×3): 500 mg via ORAL
  Filled 2019-07-26 (×3): qty 2

## 2019-07-26 MED ORDER — ACETAMINOPHEN 325 MG PO TABS: 650.0000 mg | ORAL_TABLET | ORAL | Status: DC | PRN

## 2019-07-26 MED ORDER — PRAZOSIN HCL 2 MG PO CAPS
2.0000 mg | ORAL_CAPSULE | Freq: Every day | ORAL | Status: DC
  Administered 2019-07-26: 21:00:00 2 mg via ORAL
  Filled 2019-07-26 (×2): qty 1

## 2019-07-26 MED ORDER — INSULIN LISPRO PROT & LISPRO (75-25 MIX) 100 UNIT/ML KWIKPEN: 45.0000 [IU] | PEN_INJECTOR | Freq: Three times a day (TID) | SUBCUTANEOUS | Status: DC

## 2019-07-26 MED ORDER — SERTRALINE HCL 50 MG PO TABS
50.0000 mg | ORAL_TABLET | Freq: Three times a day (TID) | ORAL | Status: DC
  Administered 2019-07-26 – 2019-07-27 (×4): 50 mg via ORAL
  Filled 2019-07-26 (×6): qty 1

## 2019-07-26 MED ORDER — THIAMINE HCL 100 MG PO TABS
100.0000 mg | ORAL_TABLET | Freq: Every day | ORAL | Status: DC
  Administered 2019-07-26 – 2019-07-27 (×2): 100 mg via ORAL
  Filled 2019-07-26 (×2): qty 1

## 2019-07-26 MED ORDER — ADULT MULTIVITAMIN W/MINERALS CH
1.0000 | ORAL_TABLET | Freq: Every day | ORAL | Status: DC
  Administered 2019-07-26 – 2019-07-27 (×2): 1 via ORAL
  Filled 2019-07-26 (×2): qty 1

## 2019-07-26 MED ORDER — ROSUVASTATIN CALCIUM 20 MG PO TABS
20.0000 mg | ORAL_TABLET | Freq: Every day | ORAL | Status: DC
  Administered 2019-07-26 – 2019-07-27 (×2): 20 mg via ORAL
  Filled 2019-07-26 (×2): qty 1

## 2019-07-26 NOTE — ED Notes (Addendum)
Pt has hx sleep apnea; o2 sats noted to drop into mid to upper 80's when asleep; pt placed on 2L O2 via [place]; pt also made aware that a urine sample is needed; pt verbalized understanding; urinal at bedside

## 2019-07-26 NOTE — ED Provider Notes (Signed)
[facility] EMERGENCY DEPARTMENT Provider Note   CSN: [id number] Arrival date & time: 07/26/19  4580     History Chief Complaint  Patient presents with  . Chest Pain    Luis [name] is a 49 y.o. male.  49 year old male with history of CHF (LVEF 50-55% in 08/2017), CAD s/p angioplasty (no PCI), NICM s/p BiV-ICD, HTN, HLD, DM, cocaine abuse, OSA on CPAP, and PTSD.  Reports that he got into a verbal altercation with his wife at 5 AM yesterday.  Decided to subsequently go on a cocaine binge beginning around 6:30 AM.  Last use of cocaine was around 11 PM tonight.  States that he began developing left-sided chest pain around 9:30 PM.  The pain is aggravated with exertion, but is intermittent.  He describes the pain as "someone wringing out a rag".  Symptoms associated with shortness of breath, diaphoresis, emesis x 1, as well as some paresthesias in the right upper extremity and fingers.  EMS alerted by the patient's defibrillator device regarding abnormal cardiac activity.  Patient states he felt "electricity" in his chest, but was never shocked; potentially felt similar to past pacing episodes.  He received 2 SL NTG with EMS causing a headache, but little relief of his chest discomfort.  Notes some discomfort along the left side of his neck since his chest pain began.  Is also reporting intermittent blurry vision in the R eye which is new.  No syncope, fevers, complete vision loss, hearing loss, extremity weakness, hemoptysis, leg swelling.  Followed by Cardiology at [facility].   Chest Pain      Past Medical History:  Diagnosis Date  . Anxiety   . CHF (congestive heart failure) (HCC)   . Diabetes mellitus without complication (HCC)   . GERD (gastroesophageal reflux disease)   . HLD (hyperlipidemia)   . Hypertension   . Pacemaker    defib as well  . PTSD (post-traumatic stress disorder)    on Depakote    Patient Active Problem List   Diagnosis Date Noted  .  Acute respiratory failure with hypoxia (HCC) 05/05/2019  . Rhinovirus infection 05/05/2019  . Morbid obesity (HCC) 05/05/2019  . Suspected COVID-19 virus infection 05/05/2019  . Acute intractable headache   . Cocaine abuse (HCC)   . Acute non intractable tension-type headache   . Blurred vision, bilateral   . Chronic systolic CHF (congestive heart failure) (HCC) 01/17/2019  . Pacemaker   . HLD (hyperlipidemia)   . Diabetes mellitus without complication (HCC)   . Hypertension   . GERD (gastroesophageal reflux disease)   . Chest pain   . Tobacco abuse   . Alcohol abuse     Past Surgical History:  Procedure Laterality Date  . PACEMAKER PLACEMENT         Family History  Problem Relation Age of Onset  . Hypertension Mother   . Bone cancer Father   . Lung cancer Father     Social History   Tobacco Use  . Smoking status: Current Every Day Smoker    Types: Cigarettes  . Smokeless tobacco: Never Used  Substance Use Topics  . Alcohol use: Yes  . Drug use: Not Currently    Home Medications Prior to Admission medications   Medication Sig Start Date End Date Taking? Authorizing Provider  clopidogrel (PLAVIX) 75 MG tablet Take 75 mg by mouth daily. 01/13/19  Yes [provider]  divalproex (DEPAKOTE) 500 MG DR tablet Take 500 mg by mouth 2 (  two) times a day. 06/30/18  Yes [provider]  furosemide (LASIX) 80 MG tablet Take 80 mg by mouth daily. 09/13/18  Yes [provider]  HUMALOG MIX 75/25 KWIKPEN (75-25) 100 UNIT/ML Kwikpen Inject 60 Units into the skin 3 (three) times daily before meals.  01/13/19  Yes [provider]  metoprolol succinate (TOPROL-XL) 100 MG 24 hr tablet Take 150 mg by mouth every morning.  09/13/18  Yes [provider]  pantoprazole (PROTONIX) 40 MG tablet Take 40 mg by mouth daily. 09/23/18  Yes [provider]  prazosin (MINIPRESS) 2 MG capsule Take 2 mg by mouth at bedtime. 10/28/18  Yes [provider]  rosuvastatin (CRESTOR) 20 MG tablet Take 20 mg by mouth daily. 09/21/18  Yes [provider]  sertraline (ZOLOFT) 50 MG tablet Take 50 mg by mouth 3 (three) times daily.  10/28/18  Yes [provider]  traZODone (DESYREL) 100 MG tablet Take 200 mg by mouth at bedtime.  12/19/18  Yes [provider]  VICTOZA 18 MG/3ML SOPN Inject 1.8 mg into the skin daily before breakfast.  01/09/19  Yes [provider]  benzonatate (TESSALON) 100 MG capsule Take 1 capsule (100 mg total) by mouth 2 (two) times daily as needed for cough. Patient not taking: Reported on 07/26/2019 05/07/19   Mitzi Hansen, MD  cetirizine (ZYRTEC) 5 MG tablet Take 1 tablet (5 mg total) by mouth daily. Patient not taking: Reported on 07/26/2019 03/25/19 07/25/28  Harvie Heck, MD  fluticasone (FLONASE) 50 MCG/ACT nasal spray Place 1 spray into both nostrils daily. Patient not taking: Reported on 07/26/2019 02/28/19   Tacy Learn, PA-C  folic acid (FOLVITE) 1 MG tablet Take 1 tablet (1 mg total) by mouth daily. Patient not taking: Reported on 07/26/2019 01/21/19   Mariel Aloe, MD    Allergies    Aspirin, Iodine-131, Iodinated diagnostic agents, Latex, Penicillins, Amoxicillin-pot clavulanate, and Lisinopril  Review of Systems   Review of Systems  Cardiovascular: Positive for chest pain.  Ten systems reviewed and are negative for acute change, except as noted in the HPI.    Physical Exam Updated Vital Signs BP 121/61 (BP Location: Right Arm)   Pulse 67   Temp 98.6 F (37 C) (Oral)   Resp 18   SpO2 94%   Physical Exam Vitals and nursing note reviewed.  Constitutional:      General: He is not in acute distress.    Appearance: He is well-developed. He is not diaphoretic.     Comments: Obese AA male.  HENT:     Head: Normocephalic and atraumatic.  Eyes:     General: No scleral icterus.    Conjunctiva/sclera: Conjunctivae normal.  Cardiovascular:     Rate and Rhythm:  Normal rate and regular rhythm.     Pulses: Normal pulses.  Pulmonary:     Effort: Pulmonary effort is normal. No respiratory distress.     Breath sounds: No stridor. No wheezing or rales.     Comments: Lungs CTAB. Respirations even and unlabored. Chest:    Musculoskeletal:        General: Normal range of motion.     Cervical back: Normal range of motion.     Comments: No BLE edema.  Skin:    General: Skin is warm and dry.     Coloration: Skin is not pale.     Findings: No erythema or rash.  Neurological:     Mental Status: He is alert  and oriented to person, place, and time.  Psychiatric:        Behavior: Behavior normal.     ED Results / Procedures / Treatments   Labs (all labs ordered are listed, but only abnormal results are displayed) Labs Reviewed  BASIC METABOLIC PANEL - Abnormal; Notable for the following components:      Result Value   Glucose, Bld 107 (*)    BUN 5 (*)    All other components within normal limits  CBC - Abnormal; Notable for the following components:   WBC 13.8 (*)    All other components within normal limits  CBG MONITORING, ED - Abnormal; Notable for the following components:   Glucose-Capillary 100 (*)    All other components within normal limits  SARS CORONAVIRUS 2 (TAT 6-24 HRS)  TROPONIN I (HIGH SENSITIVITY)  TROPONIN I (HIGH SENSITIVITY)    EKG EKG Interpretation  Date/Time:  Wednesday July 26 2019 00:52:23 EST Ventricular Rate:  70 PR Interval:  162 QRS Duration: 156 QT Interval:  486 QTC Calculation: 524 R Axis:   60 Text Interpretation: Atrial-sensed ventricular-paced rhythm Abnormal ECG No significant change since last tracing Confirmed by Marily Memos [phone]) on 07/26/2019 6:26:47 AM    Radiology DG Chest 2 View  Result Date: 07/26/2019 CLINICAL DATA:  Chest pain EXAM: CHEST - 2 VIEW COMPARISON:  05/04/2019 FINDINGS: Left AICD remains in place, unchanged. Cardiomegaly. Low lung volumes. No confluent opacities or  effusions. No acute bony abnormality. IMPRESSION: Cardiomegaly, low lung volumes.  No active disease. Electronically Signed   By: Charlett Nose M.D.   On: 07/26/2019 01:27    Procedures Procedures (including critical care time)  Medications Ordered in ED Medications  sodium chloride flush (NS) 0.9 % injection 3 mL (has no administration in time range)  acetaminophen (TYLENOL) tablet 650 mg (650 mg Oral Given 07/26/19 2947)    ED Course  I have reviewed the triage vital signs and the nursing notes.  Pertinent labs & imaging results that were available during my care of the patient were reviewed by me and considered in my medical decision making (see chart for details).  Clinical Course as of Jul 25 625  Wed Jul 26, 2019  0518 RN spoke with [employer] representative. Reports no significant events today. Had an episode of SVT approximately 6 days ago. Full report to be faxed.   [KH]    Clinical Course User Index [KH] Antony Madura, PA-C   MDM Rules/Calculators/A&P                      49 year old male with history of CHF (LVEF 50-55% in 08/2017), CAD s/p angioplasty (no PCI), NICM s/p BiV-ICD, HTN, HLD, DM, cocaine abuse, OSA on CPAP presenting for chest pain which began after a cocaine binge. Reports his BiV-ICD also sending out an "alert" prior to arrival, though no evidence of acute event on interrogation. Defibrillator never fired.  Patient does report some worsening symptoms with exertion as well as associated diaphoresis, vomiting x1, shortness of breath. Initial troponin is reassuring. Given history, cocaine use, concerning features associated with pain today, feel he would benefit from admission for chest pain rule out. Heart score is 6 c/w moderate ACS risk. Plan for admission to hospitalist service.   Final Clinical Impression(s) / ED Diagnoses Final diagnoses:  Nonspecific chest pain    Rx / DC Orders ED Discharge Orders    None       Antony Madura, PA-C 07/26/19  4503    Marily Memos, MD 07/26/19 [id number]

## 2019-07-26 NOTE — ED Triage Notes (Signed)
Pt BIB [facility], c/o chest pain that radiates down his right arm and through his neck. Pt reports that he got into an argument with his significant other and used cocaine today. Hx CHF, pt has a pacemaker/ICD. Givne 2 NTG by EMS with little relief. Aspirin not given due to allergy. EMS VSS.

## 2019-07-26 NOTE — ED Notes (Signed)
Dr. Katrinka Blazing notified that pt is having chest pressure; NAD; will continue to monitor

## 2019-07-26 NOTE — ED Notes (Signed)
Pt c/o feeling like blood sugar is dropping. CBG checked by this RN CBG: 100.

## 2019-07-26 NOTE — H&P (Signed)
History and Physical    Luis [name] KWI:[id number] DOB: 12-29-1970 DOA: 07/26/2019  Referring MD/NP/PA: Shela Leff, MD PCP: System, Pcp Not In  Patient coming from: Home via EMS  Chief Complaint: Chest pain  I have personally briefly reviewed patient's old medical records in [place]   HPI: Luis [name] is a 49 y.o. male with medical history significant of hypertension, hyperlipidemia, CHF last EF 50-55%, CAD s/p angioplasty, s/p BiV-ICD, diabetes mellitus type 2, cocaine abuse, OSA on CPAP, and PTSD.  He presents with complaints of chest pain.  After getting into a argument with his wife yesterday morning he reports using cocaine from 6 AM to 11PM.  He snorted and smoked cocaine during this time and reports spending $1300 doing so. Previously, had been clean for 2 years.  While smoking the cocaine he reported acute onset of left-sided chest pain with radiation into the right side of his neck and tingling into his fingers.  He describes it as a twisting pain like someone was ringing out a rag.  Associated symptoms included feeling clammy, nausea, nonbloody emesis, diarrhea starting today, back pain, feeling electric  shocks in his chest, and shortness of breath.   Denies his pacemaker firing.  En route with EMS patient was given 2 sublingual nitroglycerin with only minimal relief of his chest discomfort.   ED Course: Upon admission into the emergency department patient was noted to have relatively stable vital signs.  He was placed on 2 L of nasal cannula oxygen after seen desats while sleeping.  Labs significant for WBC 13.8 and troponins negative x2.  EKG did not show any significant ischemic changes.  Chest x-ray showed cardiomegaly with low lung volumes.  Patient's pacemaker was interrogated and noted to show a run of SVT on the 21st of this month.  TRH called to admit.  Review of Systems  Constitutional: Positive for chills and malaise/fatigue. Negative for  fever.  HENT: Negative for ear discharge and nosebleeds.   Eyes: Negative for photophobia and pain.  Respiratory: Positive for shortness of breath.   Cardiovascular: Positive for chest pain. Negative for leg swelling.  Gastrointestinal: Positive for diarrhea, nausea and vomiting. Negative for blood in stool.  Genitourinary: Negative for dysuria and hematuria.  Musculoskeletal: Positive for back pain, myalgias and neck pain.  Skin: Negative for rash.  Neurological: Negative for focal weakness and loss of consciousness.  Psychiatric/Behavioral: Positive for substance abuse. Negative for memory loss.    Past Medical History:  Diagnosis Date  . Anxiety   . CHF (congestive heart failure) ([place])   . Diabetes mellitus without complication ([place])   . GERD (gastroesophageal reflux disease)   . HLD (hyperlipidemia)   . Hypertension   . Pacemaker    defib as well  . PTSD (post-traumatic stress disorder)    on Depakote    Past Surgical History:  Procedure Laterality Date  . PACEMAKER PLACEMENT       reports that he has been smoking cigarettes. He has never used smokeless tobacco. He reports current alcohol use. He reports previous drug use.  Allergies  Allergen Reactions  . Aspirin Anaphylaxis    Throat closing   . Iodine-131 Hives    Hives  . Iodinated Diagnostic Agents Hives  . Latex Rash and Hives  . Penicillins Nausea And Vomiting  . Amoxicillin-Pot Clavulanate Diarrhea  . Lisinopril     Other reaction(s): Cough    Family History  Problem Relation Age of Onset  . Hypertension  Mother   . Bone cancer Father   . Lung cancer Father     Prior to Admission medications   Medication Sig Start Date End Date Taking? Authorizing Provider  clopidogrel (PLAVIX) 75 MG tablet Take 75 mg by mouth daily. 01/13/19  Yes [provider]  divalproex (DEPAKOTE) 500 MG DR tablet Take 500 mg by mouth 2 (two) times a day. 06/30/18  Yes [provider]  furosemide (LASIX) 80 MG  tablet Take 80 mg by mouth daily. 09/13/18  Yes [provider]  HUMALOG MIX 75/25 KWIKPEN (75-25) 100 UNIT/ML Kwikpen Inject 60 Units into the skin 3 (three) times daily before meals.  01/13/19  Yes [provider]  metoprolol succinate (TOPROL-XL) 100 MG 24 hr tablet Take 150 mg by mouth every morning.  09/13/18  Yes [provider]  pantoprazole (PROTONIX) 40 MG tablet Take 40 mg by mouth daily. 09/23/18  Yes [provider]  prazosin (MINIPRESS) 2 MG capsule Take 2 mg by mouth at bedtime. 10/28/18  Yes [provider]  rosuvastatin (CRESTOR) 20 MG tablet Take 20 mg by mouth daily. 09/21/18  Yes [provider]  sertraline (ZOLOFT) 50 MG tablet Take 50 mg by mouth 3 (three) times daily.  10/28/18  Yes [provider]  traZODone (DESYREL) 100 MG tablet Take 200 mg by mouth at bedtime.  12/19/18  Yes [provider]  VICTOZA 18 MG/3ML SOPN Inject 1.8 mg into the skin daily before breakfast.  01/09/19  Yes [provider]  benzonatate (TESSALON) 100 MG capsule Take 1 capsule (100 mg total) by mouth 2 (two) times daily as needed for cough. Patient not taking: Reported on 07/26/2019 05/07/19   Elige Radon, MD  cetirizine (ZYRTEC) 5 MG tablet Take 1 tablet (5 mg total) by mouth daily. Patient not taking: Reported on 07/26/2019 03/25/19 07/25/28  Eliezer Bottom, MD  fluticasone (FLONASE) 50 MCG/ACT nasal spray Place 1 spray into both nostrils daily. Patient not taking: Reported on 07/26/2019 02/28/19   Jeannie Fend, PA-C  folic acid (FOLVITE) 1 MG tablet Take 1 tablet (1 mg total) by mouth daily. Patient not taking: Reported on 07/26/2019 01/21/19   Narda Bonds, MD    Physical Exam:  Constitutional: Obese male appears to be anxious Vitals:   07/26/19 0430 07/26/19 0630 07/26/19 0700 07/26/19 0703  BP:  131/80 (!) 128/50 (!) 128/50  Pulse: 67   66  Resp:  17 16 16   Temp:      TempSrc:      SpO2: 94%   95%   Eyes: PERRL,  lids and conjunctivae normal ENMT: Mucous membranes are moist. Posterior pharynx clear of any exudate or lesions.   Neck: normal, supple, no masses, no thyromegaly Respiratory: Mildly decreased aeration, but no significant wheezes or rhonchi appreciated.  Patient on 2 L nasal cannula oxygen. Cardiovascular: Regular rate and rhythm, no murmurs / rubs / gallops. No extremity edema. 2+ pedal pulses. No carotid bruits.  Abdomen: no tenderness, no masses palpated. No hepatosplenomegaly. Bowel sounds positive.  Musculoskeletal: no clubbing / cyanosis. No joint deformity upper and lower extremities. Good ROM, no contractures. Normal muscle tone.  Skin: no rashes, lesions, ulcers. No induration Neurologic: CN 2-12 grossly intact. Sensation intact, DTR normal. Strength 5/5 in all 4.  Psychiatric: Normal judgment and insight. Alert and oriented x 3.  Intermittently tearful during evaluation    Labs on Admission: I have personally reviewed following labs and imaging studies  CBC: Recent Labs  Lab 07/26/19 0111  WBC 13.8*  HGB 14.5  HCT 43.9  MCV 87.3  PLT 253   Basic Metabolic Panel: Recent Labs  Lab 07/26/19 0111  NA 138  K 3.5  CL 103  CO2 24  GLUCOSE 107*  BUN 5*  CREATININE 0.99  CALCIUM 8.9   GFR: CrCl cannot be calculated (Unknown ideal weight.). Liver Function Tests: No results for input(s): AST, ALT, ALKPHOS, BILITOT, PROT, ALBUMIN in the last 168 hours. No results for input(s): LIPASE, AMYLASE in the last 168 hours. No results for input(s): AMMONIA in the last 168 hours. Coagulation Profile: No results for input(s): INR, PROTIME in the last 168 hours. Cardiac Enzymes: No results for input(s): CKTOTAL, CKMB, CKMBINDEX, TROPONINI in the last 168 hours. BNP (last 3 results) No results for input(s): PROBNP in the last 8760 hours. HbA1C: No results for input(s): HGBA1C in the last 72 hours. CBG: Recent Labs  Lab 07/26/19 0248  GLUCAP 100*   Lipid Profile: No  results for input(s): CHOL, HDL, LDLCALC, TRIG, CHOLHDL, LDLDIRECT in the last 72 hours. Thyroid Function Tests: No results for input(s): TSH, T4TOTAL, FREET4, T3FREE, THYROIDAB in the last 72 hours. Anemia Panel: No results for input(s): VITAMINB12, FOLATE, FERRITIN, TIBC, IRON, RETICCTPCT in the last 72 hours. Urine analysis:    Component Value Date/Time   COLORURINE YELLOW 03/25/2019 1719   APPEARANCEUR CLEAR 03/25/2019 1719   LABSPEC 1.013 03/25/2019 1719   PHURINE 5.0 03/25/2019 1719   GLUCOSEU NEGATIVE 03/25/2019 1719   HGBUR NEGATIVE 03/25/2019 1719   BILIRUBINUR NEGATIVE 03/25/2019 1719   KETONESUR NEGATIVE 03/25/2019 1719   PROTEINUR NEGATIVE 03/25/2019 1719   NITRITE NEGATIVE 03/25/2019 1719   LEUKOCYTESUR NEGATIVE 03/25/2019 1719   Sepsis Labs: No results found for this or any previous visit (from the past 240 hour(s)).   Radiological Exams on Admission: DG Chest 2 View  Result Date: 07/26/2019 CLINICAL DATA:  Chest pain EXAM: CHEST - 2 VIEW COMPARISON:  05/04/2019 FINDINGS: Left AICD remains in place, unchanged. Cardiomegaly. Low lung volumes. No confluent opacities or effusions. No acute bony abnormality. IMPRESSION: Cardiomegaly, low lung volumes.  No active disease. Electronically Signed   By: Charlett Nose M.D.   On: 07/26/2019 01:27    EKG: Independently reviewed.  Atrial sensed ventricular paced rhythm at 70 bpm  Assessment/Plan Chest pain: Acute.  Patient presents with complaints of left-sided chest pain after recently using cocaine.  Reported minimal relief with nitroglycerin.  High-sensitivity troponins negative x2 and EKG unchanged.  Patient reports anaphylaxis with using aspirin.  Suspect symptoms likely secondary to recent cocaine use. -Admit to medical telemetry bed -Check lipid panel -Check echocardiogram -Cardiology formally consulted, we will follow for any further recommendations  Cocaine abuse: Patient reports smoking cocaine from 6 AM to 11 PM  yesterday.  Previously reports being clean for 2 years prior to reusing.  Nausea, vomiting, diarrhea, sweats, and palpitations can all be secondary to cocaine use. -Follow-up urine drug screen -Check CK -Transitions of care for cocaine abuse -Normal saline IV fluids   Leukocytosis: Acute.  WBC elevated at 13.8.  Chest x-ray otherwise clear suspect that this is reactive due to recent use of cocaine. -Recheck CBC in a.m.  CAD: Patient with previous history of angioplasty. -Continue Plavix  Systolic CHF: Chronic.  Patient appears euvolemic at this time chest x-ray showed noted cardiomegaly without acute abnormalities noted, but did note low lung volumes.Last EF noted to be 50-55% back in 08/2017.   -Monitor intake and output -Daily  weight  -Restart furosemide when medically appropriate  Essential hypertension: Home medications include metoprolol succinate 150 mg daily and furosemide 80 mg daily. -Held metoprolol given recent cocaine use  Biventricular -ICD in place: Patient noted to have one episode of SVT on the 21st was documented.  He denies any recent shocks. -Continue to monitor  Diabetes mellitus type 2: Home medications include Victoza 1.8 g weekly and Humalog 75/25 mix 60 units 3 times daily. -Hypoglycemic protocol -Check hemoglobin A1c -Pharmacy substitution for NovoLog 70/30 mix and reduce dose to 45 units 3 times daily  -Adjust regimen as needed  Hyperlipidemia -Continue Crestor  Anxiety/PTSD -Continue Depakote, Zoloft, and trazodone  Obstructive sleep apnea -CPAP per RT  Tobacco abuse -Nicotine patch  Morbid obesity: BMI 54.4 kg/m  GERD -Continue Protonix  DVT prophylaxis: lovenox Code Status: Full Family Communication: No family present at bedside Disposition Plan: Possible discharge home in a.m. Consults called: Cardiology Admission status: Observation  [name] MD [facility] Pager [phone]   If 7PM-7AM, please contact  night-coverage [email or web address] Password [facility]  07/26/2019, 7:34 AM

## 2019-07-26 NOTE — Progress Notes (Signed)
Echocardiogram 2D Echocardiogram has been performed.  Luis [name] Luis [name] 07/26/2019, 10:50 AM

## 2019-07-26 NOTE — ED Notes (Signed)
HH/carb mod breakfast tray ordered

## 2019-07-26 NOTE — ED Notes (Signed)
HH/carb mod lunch tray ordered

## 2019-07-26 NOTE — ED Notes (Signed)
Echo at bedside

## 2019-07-26 NOTE — Consult Note (Signed)
Cardiology Consultation:   Patient ID: [name] MRN: [id number]; DOB: Sep 12, 1970  Admit date: 07/26/2019 Date of Consult: 07/26/2019  Primary Care Provider: System, Pcp Not In Primary Cardiologist: Bryan Lemma, MD  Primary Electrophysiologist:  None   Patient Profile:   Luis [name] is a 49 y.o. male with a hx of chronic systolic CHF/non-ischemic cardiomyopathy with h/o BiV ICD, questionable CAD, sleep apnea on CPAP since age of 35,  hypertension, hyperlipidemia, diabetes mellitus, cocaine use, tobacco use, morbid obesity, PTSD, anxiety, and depression who is being seen today for the evaluation of chest pain at the request of Dr. Katrinka Blazing.  History of Present Illness:   He is now followed at [facility] for his cardiac care. He was diagnosed with NICM in 2012 in [place]. Per patient, this was felt due to significant cocaine use at that time. He does not remember ever having a cardiac catheterization. He does note think he has any significant CAD although [facility] notes reference possibility of prior angioplasty, unclear where this information came from. He reportedly had a history of ventricular tachycardia remotely and ultimately had a ICD placed. He underwent gen change 06/2018. He was admitted 04/2018 for chest pain at [facility]. Echo at that time showed LVEF of 25-30% with global hypokinesis. He underwent a nuclear stress test which showed reversibility in the mid apex and partial reversibility in the inferior wall consistent with prior wall infarct and peri-infarct ischemia. Patient was seen by cardiology who did not feel like any further invasive ischemic evaluation was indicated at the time. He was readmitted to our facility 12/2018 with chest pain and ruled out for MI. Coronary CTA was of very limited quality with significant artifact from his BiV leads, but no significant plaque was seen in the visualized portions of the coronary arteries. He has history of cough with ACEI.  Initial note from 2018 states "Holding ACE/ARB given hx of allergy." The patient does not recall any ARB allergies.  He presented to [facility] overnight with chest pain. His significant other is at bedside. He gave me permission to discuss his medical information with her present but gives vague answers. He did cocaine yesterday and developed constant chest pressure as well as right leg numbness, nausea, diaphoresis and electric shock sensation. (Per H/P note, got into an argument yesterday with his wife and snorted approximately $1300 worth of cocaine from 6am to 11pm.) He had previously been clean for 2 years prior to reusing. Per admission note, his device was interrogated in the ED and noted to show a run of SVT on the 21st of this month, but no VT/VF or ICD shocks. hsTroponin negative x 2 (14->8). LDL 31, WBC 13.8, K 3.5, Cr 0.99. UDS + cocaine. Covid negative. 2D Echo today showed EF 35-40%, moderate LVH, global HK, grade 2 DD, mild-moderately dilated LV, normal RV, mildly elevated PA pressure. Blood pressure has been labile from 108/52 to 168/86. He is not tachycardic or hypoxic. He continues to feel "lousy" with a sensation of chest pressure, discomfort in the back of his neck, and right leg numbness.   Heart Pathway Score:  HEAR Score: 5  Past Medical History:  Diagnosis Date  . Anxiety   . Biventricular ICD (implantable cardioverter-defibrillator) in place   . Chronic systolic CHF (congestive heart failure) (HCC)   . Cocaine use   . Diabetes mellitus without complication (HCC)   . GERD (gastroesophageal reflux disease)   . HLD (hyperlipidemia)   . Hypertension   .  Morbid obesity (HCC)   . NICM (nonischemic cardiomyopathy) (HCC)   . OSA (obstructive sleep apnea)   . PTSD (post-traumatic stress disorder)    on Depakote  . Refusal of blood transfusions as patient is Jehovah's Witness   . Tobacco abuse     Past Surgical History:  Procedure Laterality Date  . BIV ICD INSERTION CRT-D    .  FOOT FRACTURE SURGERY    . ROTATOR CUFF REPAIR    . TONSILLECTOMY       Home Medications:  Prior to Admission medications   Medication Sig Start Date End Date Taking? Authorizing Provider  clopidogrel (PLAVIX) 75 MG tablet Take 75 mg by mouth daily. 01/13/19  Yes [provider]  divalproex (DEPAKOTE) 500 MG DR tablet Take 500 mg by mouth 2 (two) times a day. 06/30/18  Yes [provider]  furosemide (LASIX) 80 MG tablet Take 80 mg by mouth daily. 09/13/18  Yes [provider]  HUMALOG MIX 75/25 KWIKPEN (75-25) 100 UNIT/ML Kwikpen Inject 60 Units into the skin 3 (three) times daily before meals.  01/13/19  Yes [provider]  metoprolol succinate (TOPROL-XL) 100 MG 24 hr tablet Take 150 mg by mouth every morning.  09/13/18  Yes [provider]  pantoprazole (PROTONIX) 40 MG tablet Take 40 mg by mouth daily. 09/23/18  Yes [provider]  prazosin (MINIPRESS) 2 MG capsule Take 2 mg by mouth at bedtime. 10/28/18  Yes [provider]  rosuvastatin (CRESTOR) 20 MG tablet Take 20 mg by mouth daily. 09/21/18  Yes [provider]  sertraline (ZOLOFT) 50 MG tablet Take 50 mg by mouth 3 (three) times daily.  10/28/18  Yes [provider]  traZODone (DESYREL) 100 MG tablet Take 200 mg by mouth at bedtime.  12/19/18  Yes [provider]  VICTOZA 18 MG/3ML SOPN Inject 1.8 mg into the skin daily before breakfast.  01/09/19  Yes [provider]  benzonatate (TESSALON) 100 MG capsule Take 1 capsule (100 mg total) by mouth 2 (two) times daily as needed for cough. Patient not taking: Reported on 07/26/2019 05/07/19   Elige Radon, MD  cetirizine (ZYRTEC) 5 MG tablet Take 1 tablet (5 mg total) by mouth daily. Patient not taking: Reported on 07/26/2019 03/25/19 07/25/28  Eliezer Bottom, MD  fluticasone (FLONASE) 50 MCG/ACT nasal spray Place 1 spray into both nostrils daily. Patient not taking: Reported on 07/26/2019 02/28/19    Jeannie Fend, PA-C  folic acid (FOLVITE) 1 MG tablet Take 1 tablet (1 mg total) by mouth daily. Patient not taking: Reported on 07/26/2019 01/21/19   Narda Bonds, MD    Inpatient Medications: Scheduled Meds: . clopidogrel  75 mg Oral Daily  . divalproex  500 mg Oral Q12H  . enoxaparin (LOVENOX) injection  40 mg Subcutaneous Q24H  . insulin aspart protamine- aspart  45 Units Subcutaneous TID WC  . pantoprazole  40 mg Oral Daily  . prazosin  2 mg Oral QHS  . rosuvastatin  20 mg Oral Daily  . sertraline  50 mg Oral TID  . sodium chloride flush  3 mL Intravenous Once  . traZODone  200 mg Oral QHS   Continuous Infusions: . sodium chloride 75 mL/hr at 07/26/19 0806   PRN Meds: acetaminophen, alum & mag hydroxide-simeth, morphine injection, ondansetron (ZOFRAN) IV  Allergies:    Allergies  Allergen Reactions  . Aspirin Anaphylaxis    Throat closing   . Iodine-131 Hives    Hives  .  Iodinated Diagnostic Agents Hives  . Latex Rash and Hives  . Penicillins Nausea And Vomiting  . Amoxicillin-Pot Clavulanate Diarrhea  . Lisinopril     Other reaction(s): Cough    Social History:   Social History   Socioeconomic History  . Marital status: Legally Separated    Spouse name: Not on file  . Number of children: Not on file  . Years of education: Not on file  . Highest education level: Not on file  Occupational History  . Not on file  Tobacco Use  . Smoking status: Current Every Day Smoker    Types: Cigarettes  . Smokeless tobacco: Never Used  Substance and Sexual Activity  . Alcohol use: Yes  . Drug use: Yes    Types: Cocaine  . Sexual activity: Not on file  Other Topics Concern  . Not on file  Social History Narrative  . Not on file   Social Determinants of Health   Financial Resource Strain:   . Difficulty of Paying Living Expenses: Not on file  Food Insecurity:   . Worried About Programme researcher, broadcasting/film/video in the Last Year: Not on file  . Ran Out of Food in the Last  Year: Not on file  Transportation Needs:   . Lack of Transportation (Medical): Not on file  . Lack of Transportation (Non-Medical): Not on file  Physical Activity:   . Days of Exercise per Week: Not on file  . Minutes of Exercise per Session: Not on file  Stress:   . Feeling of Stress : Not on file  Social Connections:   . Frequency of Communication with Friends and Family: Not on file  . Frequency of Social Gatherings with Friends and Family: Not on file  . Attends Religious Services: Not on file  . Active Member of Clubs or Organizations: Not on file  . Attends Banker Meetings: Not on file  . Marital Status: Not on file  Intimate Partner Violence:   . Fear of Current or Ex-Partner: Not on file  . Emotionally Abused: Not on file  . Physically Abused: Not on file  . Sexually Abused: Not on file    Family History:    Family History  Problem Relation Age of Onset  . Hypertension Mother   . Bone cancer Father   . Lung cancer Father      ROS:  Please see the history of present illness.  All other ROS reviewed and negative.     Physical Exam/Data:   Vitals:   07/26/19 1031 07/26/19 1115 07/26/19 1330 07/26/19 1510  BP:  (!) 118/57 (!) 142/73 (!) 168/86  Pulse: 60 60 60 61  Resp: 18 16 20 18   Temp:    98.2 F (36.8 C)  TempSrc:      SpO2: 95% 93% 100% 98%  Weight:    134.9 kg  Height:    5\' 2"  (1.575 m)    Intake/Output Summary (Last 24 hours) at 07/26/2019 1612 Last data filed at 07/26/2019 1042 Gross per 24 hour  Intake 480 ml  Output --  Net 480 ml   Last 3 Weights 07/26/2019 05/04/2019 03/25/2019  Weight (lbs) 297 lb 6.4 oz 309 lb 320 lb  Weight (kg) 134.9 kg 140.161 kg 145.151 kg     Body mass index is 54.4 kg/m.  General: Well developed morbidly obese AAM in no acute distress. Head: Normocephalic, atraumatic, sclera non-icteric, no xanthomas, nares are without discharge. Neck: Negative for carotid bruits. JVP not  elevated. Lungs: Clear  bilaterally to auscultation without wheezes, rales, or rhonchi. Breathing is unlabored. Heart: RRR S1 S2 without murmurs, rubs, or gallops.  Abdomen: Soft, non-tender, non-distended with normoactive bowel sounds. No rebound/guarding. Extremities: No clubbing or cyanosis. No edema. Distal pedal pulses are 2+ and equal bilaterally. Neuro: Alert and oriented X 3. Moves all extremities spontaneously. Psych:  Responds to questions appropriately with a terse affect  EKG:  The EKG was personally reviewed and demonstrates:  Atrial sensed, V paced rhythm, 70bpm, nonspecific TW changes similar to prior  Telemetry:  Telemetry was personally reviewed and demonstrates: V paced rhythm, no acute arrhythmias  Relevant CV Studies: Nuc 04/2018 IMPRESSION: 1. Reversibility in the mid apex, felt to represent a focus of ischemia. Partial reversibility in the inferior wall, consistent with prior inferior wall infarct with peri-infarct ischemia. No other lesions identified on the SPECT study. 2. Prominent left ventricle. Diffuse severe left ventricular wall hypokinesia. Suspect cardiomyopathy. 3. Left ventricular ejection fraction 26% 4. Non invasive risk stratification*: High  2D echo 04/2018 Summary Technically difficult and limited study. The left ventricular endocardium was not well visualized. Grossly Moderate to severe global LV hypokinesis. Ejection fraction is visually estimated at 25-30%    Laboratory Data:  High Sensitivity Troponin:   Recent Labs  Lab 07/26/19 0111 07/26/19 0628  TROPONINIHS 14 8     Chemistry Recent Labs  Lab 07/26/19 0111  NA 138  K 3.5  CL 103  CO2 24  GLUCOSE 107*  BUN 5*  CREATININE 0.99  CALCIUM 8.9  GFRNONAA >60  GFRAA >60  ANIONGAP 11    No results for input(s): PROT, ALBUMIN, AST, ALT, ALKPHOS, BILITOT in the last 168 hours. Hematology Recent Labs  Lab 07/26/19 0111  WBC 13.8*  RBC 5.03  HGB 14.5  HCT 43.9  MCV 87.3  MCH 28.8  MCHC  33.0  RDW 14.4  PLT 253   BNPNo results for input(s): BNP, PROBNP in the last 168 hours.  DDimer No results for input(s): DDIMER in the last 168 hours.   Radiology/Studies:  DG Chest 2 View  Result Date: 07/26/2019 CLINICAL DATA:  Chest pain EXAM: CHEST - 2 VIEW COMPARISON:  05/04/2019 FINDINGS: Left AICD remains in place, unchanged. Cardiomegaly. Low lung volumes. No confluent opacities or effusions. No acute bony abnormality. IMPRESSION: Cardiomegaly, low lung volumes.  No active disease. Electronically Signed   By: Charlett Nose M.D.   On: 07/26/2019 01:27   ECHOCARDIOGRAM COMPLETE  Result Date: 07/26/2019   ECHOCARDIOGRAM REPORT   Patient Name:   Luis [name] Date of Exam: 07/26/2019 Medical Rec #:  [id number]             Height:       65.0 in Accession #:    [id number]            Weight:       309.0 lb Date of Birth:  [birth year]              BSA:          2.38 m Patient Age:    48 years              BP:           140/79 mmHg Patient Gender: M                     HR:           60 bpm. Exam Location:  Inpatient  Procedure: 2D Echo, Color Doppler and Cardiac Doppler Indications:    R07.9* Chest pain, unspecified  History:        Patient has prior history of Echocardiogram examinations, most                 recent 05/19/2018. CHF, Pacemaker and Defibrillator; Risk                 Factors:Hypertension, Diabetes, Dyslipidemia and Sleep Apnea.                 History of and recent use of cocaine. Prior echo performed at                 [name]:    [place] Referring Phys: [phone] [place]  1. Left ventricular ejection fraction, by visual estimation, is 35 to 40%. The left ventricle has moderate to severely decreased function. There is moderately increased left ventricular hypertrophy.  2. Left ventricular diastolic parameters are consistent with Grade II diastolic dysfunction (pseudonormalization).  3. Mild to moderately dilated left ventricular internal  cavity size.  4. The left ventricle demonstrates global hypokinesis.  5. Global right ventricle has normal systolic function.The right ventricular size is normal. No increase in right ventricular wall thickness.  6. Left atrial size was normal.  7. Right atrial size was normal.  8. Trivial pericardial effusion is present.  9. The mitral valve is normal in structure. Trivial mitral valve regurgitation. 10. The tricuspid valve is normal in structure. 11. The tricuspid valve is normal in structure. Tricuspid valve regurgitation is trivial. 12. The aortic valve is tricuspid. Aortic valve regurgitation is not visualized. No evidence of aortic valve sclerosis or stenosis. 13. The pulmonic valve was grossly normal. Pulmonic valve regurgitation is not visualized. 14. Mildly elevated pulmonary artery systolic pressure. 15. The inferior vena cava is normal in size with greater than 50% respiratory variability, suggesting right atrial pressure of 3 mmHg. 16. No prior Echocardiogram. FINDINGS  Left Ventricle: Left ventricular ejection fraction, by visual estimation, is 35 to 40%. The left ventricle has moderate to severely decreased function. The left ventricle demonstrates global hypokinesis. The left ventricular internal cavity size was mildly to moderately dilated left ventricle. There is moderately increased left ventricular hypertrophy. Eccentric left ventricular hypertrophy. Left ventricular diastolic parameters are consistent with Grade II diastolic dysfunction (pseudonormalization). Right Ventricle: The right ventricular size is normal. No increase in right ventricular wall thickness. Global RV systolic function is has normal systolic function. The tricuspid regurgitant velocity is 2.53 m/s, and with an assumed right atrial pressure  of 8 mmHg, the estimated right ventricular systolic pressure is mildly elevated at 33.6 mmHg. Left Atrium: Left atrial size was normal in size. Right Atrium: Right atrial size was normal in  size Pericardium: Trivial pericardial effusion is present. There is a small pleural effusion in both left and right lateral regions. Mitral Valve: The mitral valve is normal in structure. Trivial mitral valve regurgitation. Tricuspid Valve: The tricuspid valve is normal in structure. Tricuspid valve regurgitation is trivial. Aortic Valve: The aortic valve is tricuspid. Aortic valve regurgitation is not visualized. The aortic valve is structurally normal, with no evidence of sclerosis or stenosis. Pulmonic Valve: The pulmonic valve was grossly normal. Pulmonic valve regurgitation is not visualized. Pulmonic regurgitation is not visualized. Aorta: The aortic root, ascending aorta and aortic arch are all structurally normal, with no evidence of dilitation or obstruction. Pulmonary Artery: The pulmonary artery is of normal  size and origin. Venous: The inferior vena cava is normal in size with greater than 50% respiratory variability, suggesting right atrial pressure of 3 mmHg. IAS/Shunts: No atrial level shunt detected by color flow Doppler.  LEFT VENTRICLE PLAX 2D LVIDd:         5.48 cm       Diastology LVIDs:         4.91 cm       LV e' lateral:   7.62 cm/s LV PW:         1.58 cm       LV E/e' lateral: 9.8 LV IVS:        1.28 cm       LV e' medial:    5.98 cm/s LVOT diam:     2.20 cm       LV E/e' medial:  12.5 LV SV:         33 ml LV SV Index:   12.53 LVOT Area:     3.80 cm  LV Volumes (MOD) LV area d, A2C:    45.50 cm LV area d, A4C:    49.50 cm LV area s, A2C:    33.10 cm LV area s, A4C:    38.70 cm LV major d, A2C:   9.64 cm LV major d, A4C:   10.60 cm LV major s, A2C:   8.58 cm LV major s, A4C:   9.62 cm LV vol d, MOD A2C: 180.0 ml LV vol d, MOD A4C: 192.0 ml LV vol s, MOD A2C: 112.0 ml LV vol s, MOD A4C: 133.0 ml LV SV MOD A2C:     68.0 ml LV SV MOD A4C:     192.0 ml LV SV MOD BP:      66.1 ml RIGHT VENTRICLE RV S prime:     12.20 cm/s TAPSE (M-mode): 2.7 cm LEFT ATRIUM             Index       RIGHT ATRIUM            Index LA diam:        3.80 cm 1.60 cm/m  RA Area:     19.40 cm LA Vol (A2C):   64.7 ml 27.19 ml/m RA Volume:   49.00 ml  20.59 ml/m LA Vol (A4C):   59.2 ml 24.88 ml/m LA Biplane Vol: 64.6 ml 27.14 ml/m  AORTIC VALVE LVOT Vmax:   76.60 cm/s LVOT Vmean:  53.000 cm/s LVOT VTI:    0.153 m  AORTA Ao Root diam: 3.10 cm Ao Asc diam:  2.70 cm MITRAL VALVE                        TRICUSPID VALVE MV Area (PHT): 3.85 cm             TR Peak grad:   25.6 mmHg MV PHT:        57.13 msec           TR Vmax:        253.00 cm/s MV Decel Time: 197 msec MV E velocity: 74.70 cm/s 103 cm/s  SHUNTS MV A velocity: 56.90 cm/s 70.3 cm/s Systemic VTI:  0.15 m MV E/A ratio:  1.31       1.5       Systemic Diam: 2.20 cm  Jodelle Red MD Electronically signed by Jodelle Red MD Signature Date/Time: 07/26/2019/2:23:11 PM    Final     HEAR Score (  for undifferentiated chest pain):  HEAR Score: 5    Assessment and Plan:   1. Chest pain - likely due to cocaine binge. Surprisingly, despite many hours of excessive cocaine use, he has ruled out for MI. Therefore it seems reasonable to make the assumption he has effectively undergone a "physiologic stress test" without any evidence for active ischemia. Additionally, echocardiogram shows stable (if not slightly improved LV function) from 2019. Do not anticipate further cardiac workup at this time but will review with MD. May be reasonable to trial nitrate to combat vasospasm.  2. Chronic combined CHF - EF 35-40%. CXR clear. No signs of fluid overload on exam. Has h/o cough with lisinopril (primary cardiologist's notes state "Holding ACE/ARB given hx of allergy). It was previously recommended to f/u with [facility] team to address. If he is felt to have a true contraindication, could consider combination of nitrates/hydralazine. Follow BP for now given lability. Recommend holding beta blocker at this time given cocaine intoxication. If patient is felt to be at risk for  recurrent use beyond this hospitalization, may need to continue to hold until he demonstrates abstinence from cocaine.  3. Uncontrolled DM - per primary team. A1C 9.4.  4. Questionable history of CAD - per [facility] notes, possible hx of angioplasty but details unclear. Patient does not recall having a cath at any time. Coronary CTA 04/2018 was unrevealing for coronary disease. He has h/o aspirin allergy so has been maintained on Plavix by primary cardiologist.  5. Morbid obesity with OSA - lifestyle modification will be essential for his prognosis.  6. Episode of SVT on device interrogation - replete K (3.5) with x1. Will add on Mg level - recommend to keep Mg 2.0 or greater. Otherwise f/u with primary cardiologist.  For questions or updates, please contact [facility] Please consult [email or web address] for contact info under   Signed, Laurann Montana, PA-C  07/26/2019 4:12 PM

## 2019-07-26 NOTE — ED Notes (Signed)
[employer] called about interrogation of pacemaker. Most recent episode of SVT noted on Jan 21st, but nothing noted today. PA informed and info to be faxed

## 2019-07-27 ENCOUNTER — Other Ambulatory Visit: Payer: Self-pay | Admitting: Internal Medicine

## 2019-07-27 DIAGNOSIS — I110 Hypertensive heart disease with heart failure: Secondary | ICD-10-CM | POA: Diagnosis not present

## 2019-07-27 DIAGNOSIS — R079 Chest pain, unspecified: Secondary | ICD-10-CM | POA: Diagnosis not present

## 2019-07-27 DIAGNOSIS — I2510 Atherosclerotic heart disease of native coronary artery without angina pectoris: Secondary | ICD-10-CM | POA: Diagnosis not present

## 2019-07-27 DIAGNOSIS — I5022 Chronic systolic (congestive) heart failure: Secondary | ICD-10-CM | POA: Diagnosis not present

## 2019-07-27 LAB — CBC WITH DIFFERENTIAL/PLATELET
Abs Immature Granulocytes: 0.03 10*3/uL (ref 0.00–0.07)
Basophils Absolute: 0 10*3/uL (ref 0.0–0.1)
Basophils Relative: 0 %
Eosinophils Absolute: 0.1 10*3/uL (ref 0.0–0.5)
Eosinophils Relative: 1 %
HCT: 39.9 % (ref 39.0–52.0)
Hemoglobin: 13 g/dL (ref 13.0–17.0)
Immature Granulocytes: 0 %
Lymphocytes Relative: 33 %
Lymphs Abs: 3.1 10*3/uL (ref 0.7–4.0)
MCH: 28.8 pg (ref 26.0–34.0)
MCHC: 32.6 g/dL (ref 30.0–36.0)
MCV: 88.5 fL (ref 80.0–100.0)
Monocytes Absolute: 0.7 10*3/uL (ref 0.1–1.0)
Monocytes Relative: 8 %
Neutro Abs: 5.6 10*3/uL (ref 1.7–7.7)
Neutrophils Relative %: 58 %
Platelets: 228 10*3/uL (ref 150–400)
RBC: 4.51 MIL/uL (ref 4.22–5.81)
RDW: 14.2 % (ref 11.5–15.5)
WBC: 9.5 10*3/uL (ref 4.0–10.5)
nRBC: 0 % (ref 0.0–0.2)

## 2019-07-27 LAB — PHOSPHORUS: Phosphorus: 4.7 mg/dL — ABNORMAL HIGH (ref 2.5–4.6)

## 2019-07-27 LAB — GLUCOSE, CAPILLARY
Glucose-Capillary: 179 mg/dL — ABNORMAL HIGH (ref 70–99)
Glucose-Capillary: 146 mg/dL — ABNORMAL HIGH (ref 70–99)

## 2019-07-27 MED ORDER — ISOSORBIDE MONONITRATE ER 30 MG PO TB24: 30.0000 mg | ORAL_TABLET | Freq: Every day | ORAL | 0 refills | Status: DC

## 2019-07-27 MED ORDER — PANTOPRAZOLE SODIUM 40 MG PO TBEC: 40.0000 mg | DELAYED_RELEASE_TABLET | Freq: Two times a day (BID) | ORAL | 0 refills | Status: DC

## 2019-07-27 MED ORDER — PANTOPRAZOLE SODIUM 40 MG PO TBEC: 40.0000 mg | DELAYED_RELEASE_TABLET | Freq: Two times a day (BID) | ORAL | Status: DC

## 2019-07-27 MED ORDER — LIDOCAINE VISCOUS HCL 2 % MT SOLN
15.0000 mL | Freq: Once | OROMUCOSAL | Status: DC
  Filled 2019-07-27: qty 15

## 2019-07-27 MED ORDER — ALUM & MAG HYDROXIDE-SIMETH 200-200-20 MG/5ML PO SUSP
30.0000 mL | Freq: Once | ORAL | Status: DC
  Filled 2019-07-27: qty 30

## 2019-07-27 NOTE — TOC Progression Note (Addendum)
Transition of Care [facility]) - Progression Note    Patient Details  Name: Luis [name] MRN: [id number] Date of Birth: June 01, 1971  Transition of [facility] CM/SW Contact  Leone Haven, RN Phone Number: 07/27/2019, 12:27 PM  Clinical Narrative:     Patient for dc home today, NCM gave patient substance abuse resources. Patient states he has a PCP at [facility] , [place], he states he will make follow up apts.        Expected Discharge Plan and Services           Expected Discharge Date: 07/27/19                                     Social Determinants of Health (SDOH) Interventions    Readmission Risk Interventions No flowsheet data found.

## 2019-07-27 NOTE — Care Management Obs Status (Signed)
MEDICARE OBSERVATION STATUS NOTIFICATION   Patient Details  Name: Luis [name] MRN: [id number] Date of Birth: 18-Jul-1970   Medicare Observation Status Notification Given:  Yes    Leone Haven, RN 07/27/2019, 12:22 PM

## 2019-07-27 NOTE — Discharge Summary (Signed)
Physician Discharge Summary  Luis [name]:[id number] DOB: 01/08/1971 DOA: 07/26/2019  PCP: System, Pcp Not In  Admit date: 07/26/2019 Discharge date: 07/27/2019  Time spent: 35 minutes  Recommendations for Outpatient Follow-up:  1. Follow up with cardiology 1-2 weeks for evaluation of chest pain, heart failure and BP control as medications adjusted 2. Follow up with PCP 1-2 weeks for evaluation of back pain and right arm/hand pain/numbness   Discharge Diagnoses:  Principal Problem:   Chest pain Active Problems:   Chronic systolic CHF (congestive heart failure) (HCC)   HLD (hyperlipidemia)   Hypertension   GERD (gastroesophageal reflux disease)   Alcohol abuse   Cocaine abuse (HCC)   Nonischemic cardiomyopathy (HCC)   Leukocytosis   Discharge Condition: Stable  Diet recommendation: Heart healthy  Filed Weights   07/26/19 1510 07/27/19 0425  Weight: 134.9 kg 134.7 kg    History of present illness:  Luis [name] is a 49 y.o. male with medical history significant of hypertension, hyperlipidemia, CHF last EF 50-55%, CAD s/p angioplasty, s/p BiV-ICD, diabetes mellitus type 2, cocaine abuse, OSA on CPAP, and PTSD.  He presented 07/26/19 with complaints of chest pain.  After getting into a argument with his wife the morning prior he reported using cocaine from 6 AM to 11PM.  He snorted and smoked cocaine during this time and reported spending $1300 doing so. Previously, had been clean for 2 years.  While smoking the cocaine he reported acute onset of left-sided chest pain with radiation into the right side of his neck and tingling into his fingers.  He describes it as a twisting pain like someone was ringing out a rag.  Associated symptoms included feeling clammy, nausea, nonbloody emesis, diarrhea starting today, back pain, feeling electric  shocks in his chest, and shortness of breath.   Denied his pacemaker firing.  En route with EMS patient was given 2 sublingual  nitroglycerin with only minimal relief of his chest discomfort.  Hospital Course:  Chest pain: Acute.  Patient presented with complaints of left-sided chest pain after recent using cocaine.  Reported minimal relief with nitroglycerin.  High-sensitivity troponins negative x2 and EKG unchanged.  Patient reported anaphylaxis with using aspirin.  Suspect symptoms likely secondary to recent cocaine use.  Echo revealed EF of 35 to 40% with moderate to severe decrease in function moderately increased left ventricular hypertrophy grade 2 diastolic dysfunction.  Lipid panel with triglycerides at 244 HDL 34.  EKG with Atrial-sensed ventricular-paced rhythm.  He was evaluated by cardiology who ruled out MI.  They recommended holding beta-blocker until he has demonstrated abstinence from cocaine and started long-acting nitrate.  Recommended follow-up with his primary cardiologist at [facility].  At the time of discharge he is chest pain-free.  Cocaine abuse: Patient reported smoking cocaine from 6 AM to 11 PM the day prior to presentation.  Previously reported being clean for 2 years prior to reusing.  CK within the limits of normal.  Urine drug screen positive for cocaine   Leukocytosis: Acute.  WBC elevated at 13.8.  Chest x-ray otherwise clear suspect that this is reactive due to recent use of cocaine.  On day of discharge leukocytosis resolved.  Patient remained afebrile and nontoxic-appearing.  CAD: Patient with previous history of angioplasty.  Follow-up with primary cardiology.  See #1  Systolic CHF: Chronic.  Patient appeared euvolemic on admission. Chest x-ray showed noted cardiomegaly without acute abnormalities noted, but did note low lung volumes.Last EF noted to be 50-55% back in  08/2017.    Echo as noted above.  Essential hypertension: Home medications include metoprolol succinate 150 mg daily and furosemide 80 mg daily. Stop BB and use imdur per cards recs.   Biventricular -ICD in place: Patient  noted to have one episode of SVT on the 21st was documented.  He denies any recent shocks.  Diabetes mellitus type 2: uncontrolled. HgA1c 9.4. Home medications include Victoza 1.8 g weekly and Humalog 75/25 mix 60 units 3 times daily. Follow up with PCP for optimal control.   Hyperlipidemia. Continue Crestor  Anxiety/PTSD.Continue Depakote, Zoloft, and trazodone  Obstructive sleep apnea. CPAP   Tobacco abuse Nicotine patch  Morbid obesity: BMI 54.4 kg/m  GERD stable   Procedures: echo Consultations:  [place] [facility]  Discharge Exam: Vitals:   07/27/19 0425 07/27/19 0838  BP: 100/65 120/65  Pulse: 70 69  Resp: 18   Temp: 98.7 F (37.1 C)   SpO2: 95%     General: alert obese no acute distress Cardiovascular: rrr no mgr trace LE edema bilaterally Respiratory: normal effort BS clear but distant no crackles  Discharge Instructions   Discharge Instructions    Call MD for:  difficulty breathing, headache or visual disturbances   Complete by: As directed    Call MD for:  persistant dizziness or light-headedness   Complete by: As directed    Call MD for:  severe uncontrolled pain   Complete by: As directed    Call MD for:  temperature >100.4   Complete by: As directed    Diet - low sodium heart healthy   Complete by: As directed    Discharge instructions   Complete by: As directed    Take medications as instructed Follow up with cardiology in 1-2 weeks for evaluation of chest pain Follow up with PCP 1-2 weeks for evaluation of back pain and right arm/hand numbness Avoid cocaine   Increase activity slowly   Complete by: As directed      Allergies as of 07/27/2019      Reactions   Aspirin Anaphylaxis   Throat closing   Iodine-131 Hives   Hives   Iodinated Diagnostic Agents Hives   Latex Rash, Hives   Penicillins Nausea And Vomiting   Amoxicillin-pot Clavulanate Diarrhea   Lisinopril    Other reaction(s): Cough      Medication List    STOP  taking these medications   benzonatate 100 MG capsule Commonly known as: TESSALON   cetirizine 5 MG tablet Commonly known as: ZYRTEC   fluticasone 50 MCG/ACT nasal spray Commonly known as: FLONASE   metoprolol succinate 100 MG 24 hr tablet Commonly known as: TOPROL-XL     TAKE these medications   clopidogrel 75 MG tablet Commonly known as: PLAVIX Take 75 mg by mouth daily.   divalproex 500 MG DR tablet Commonly known as: DEPAKOTE Take 500 mg by mouth 2 (two) times a day.   folic acid 1 MG tablet Commonly known as: FOLVITE Take 1 tablet (1 mg total) by mouth daily.   furosemide 80 MG tablet Commonly known as: LASIX Take 80 mg by mouth daily.   HumaLOG Mix 75/25 KwikPen (75-25) 100 UNIT/ML Kwikpen Generic drug: Insulin Lispro Prot & Lispro Inject 60 Units into the skin 3 (three) times daily before meals.   isosorbide mononitrate 30 MG 24 hr tablet Commonly known as: IMDUR Take 1 tablet (30 mg total) by mouth daily. Start taking on: July 28, 2019   pantoprazole 40 MG tablet Commonly known as:  PROTONIX Take 1 tablet (40 mg total) by mouth 2 (two) times daily. What changed: when to take this   prazosin 2 MG capsule Commonly known as: MINIPRESS Take 2 mg by mouth at bedtime.   rosuvastatin 20 MG tablet Commonly known as: CRESTOR Take 20 mg by mouth daily.   sertraline 50 MG tablet Commonly known as: ZOLOFT Take 50 mg by mouth 3 (three) times daily.   traZODone 100 MG tablet Commonly known as: DESYREL Take 200 mg by mouth at bedtime.   Victoza 18 MG/3ML Sopn Generic drug: liraglutide Inject 1.8 mg into the skin daily before breakfast.      Allergies  Allergen Reactions  . Aspirin Anaphylaxis    Throat closing   . Iodine-131 Hives    Hives  . Iodinated Diagnostic Agents Hives  . Latex Rash and Hives  . Penicillins Nausea And Vomiting  . Amoxicillin-Pot Clavulanate Diarrhea  . Lisinopril     Other reaction(s): Cough      The results of  significant diagnostics from this hospitalization (including imaging, microbiology, ancillary and laboratory) are listed below for reference.    Significant Diagnostic Studies: DG Chest 2 View  Result Date: 07/26/2019 CLINICAL DATA:  Chest pain EXAM: CHEST - 2 VIEW COMPARISON:  05/04/2019 FINDINGS: Left AICD remains in place, unchanged. Cardiomegaly. Low lung volumes. No confluent opacities or effusions. No acute bony abnormality. IMPRESSION: Cardiomegaly, low lung volumes.  No active disease. Electronically Signed   By: Charlett Nose M.D.   On: 07/26/2019 01:27   ECHOCARDIOGRAM COMPLETE  Result Date: 07/26/2019   ECHOCARDIOGRAM REPORT   Patient Name:   Luis [name] Date of Exam: 07/26/2019 Medical Rec #:  [id number]             Height:       65.0 in Accession #:    [id number]            Weight:       309.0 lb Date of Birth:  Jul 25, 1970              BSA:          2.38 m Patient Age:    48 years              BP:           140/79 mmHg Patient Gender: M                     HR:           60 bpm. Exam Location:  Inpatient Procedure: 2D Echo, Color Doppler and Cardiac Doppler Indications:    R07.9* Chest pain, unspecified  History:        Patient has prior history of Echocardiogram examinations, most                 recent 05/19/2018. CHF, Pacemaker and Defibrillator; Risk                 Factors:Hypertension, Diabetes, Dyslipidemia and Sleep Apnea.                 History of and recent use of cocaine. Prior echo performed at                 [facility].  Sonographer:    Irving Burton Senior RDCS Referring Phys: [id number] RONDELL A SMITH IMPRESSIONS  1. Left ventricular ejection fraction, by visual estimation, is 35 to 40%. The left ventricle has moderate to severely decreased function.  There is moderately increased left ventricular hypertrophy.  2. Left ventricular diastolic parameters are consistent with Grade II diastolic dysfunction (pseudonormalization).  3. Mild to moderately dilated left ventricular internal cavity  size.  4. The left ventricle demonstrates global hypokinesis.  5. Global right ventricle has normal systolic function.The right ventricular size is normal. No increase in right ventricular wall thickness.  6. Left atrial size was normal.  7. Right atrial size was normal.  8. Trivial pericardial effusion is present.  9. The mitral valve is normal in structure. Trivial mitral valve regurgitation. 10. The tricuspid valve is normal in structure. 11. The tricuspid valve is normal in structure. Tricuspid valve regurgitation is trivial. 12. The aortic valve is tricuspid. Aortic valve regurgitation is not visualized. No evidence of aortic valve sclerosis or stenosis. 13. The pulmonic valve was grossly normal. Pulmonic valve regurgitation is not visualized. 14. Mildly elevated pulmonary artery systolic pressure. 15. The inferior vena cava is normal in size with greater than 50% respiratory variability, suggesting right atrial pressure of 3 mmHg. 16. No prior Echocardiogram. FINDINGS  Left Ventricle: Left ventricular ejection fraction, by visual estimation, is 35 to 40%. The left ventricle has moderate to severely decreased function. The left ventricle demonstrates global hypokinesis. The left ventricular internal cavity size was mildly to moderately dilated left ventricle. There is moderately increased left ventricular hypertrophy. Eccentric left ventricular hypertrophy. Left ventricular diastolic parameters are consistent with Grade II diastolic dysfunction (pseudonormalization). Right Ventricle: The right ventricular size is normal. No increase in right ventricular wall thickness. Global RV systolic function is has normal systolic function. The tricuspid regurgitant velocity is 2.53 m/s, and with an assumed right atrial pressure  of 8 mmHg, the estimated right ventricular systolic pressure is mildly elevated at 33.6 mmHg. Left Atrium: Left atrial size was normal in size. Right Atrium: Right atrial size was normal in size  Pericardium: Trivial pericardial effusion is present. There is a small pleural effusion in both left and right lateral regions. Mitral Valve: The mitral valve is normal in structure. Trivial mitral valve regurgitation. Tricuspid Valve: The tricuspid valve is normal in structure. Tricuspid valve regurgitation is trivial. Aortic Valve: The aortic valve is tricuspid. Aortic valve regurgitation is not visualized. The aortic valve is structurally normal, with no evidence of sclerosis or stenosis. Pulmonic Valve: The pulmonic valve was grossly normal. Pulmonic valve regurgitation is not visualized. Pulmonic regurgitation is not visualized. Aorta: The aortic root, ascending aorta and aortic arch are all structurally normal, with no evidence of dilitation or obstruction. Pulmonary Artery: The pulmonary artery is of normal size and origin. Venous: The inferior vena cava is normal in size with greater than 50% respiratory variability, suggesting right atrial pressure of 3 mmHg. IAS/Shunts: No atrial level shunt detected by color flow Doppler.  LEFT VENTRICLE PLAX 2D LVIDd:         5.48 cm       Diastology LVIDs:         4.91 cm       LV e' lateral:   7.62 cm/s LV PW:         1.58 cm       LV E/e' lateral: 9.8 LV IVS:        1.28 cm       LV e' medial:    5.98 cm/s LVOT diam:     2.20 cm       LV E/e' medial:  12.5 LV SV:         33  ml LV SV Index:   12.53 LVOT Area:     3.80 cm  LV Volumes (MOD) LV area d, A2C:    45.50 cm LV area d, A4C:    49.50 cm LV area s, A2C:    33.10 cm LV area s, A4C:    38.70 cm LV major d, A2C:   9.64 cm LV major d, A4C:   10.60 cm LV major s, A2C:   8.58 cm LV major s, A4C:   9.62 cm LV vol d, MOD A2C: 180.0 ml LV vol d, MOD A4C: 192.0 ml LV vol s, MOD A2C: 112.0 ml LV vol s, MOD A4C: 133.0 ml LV SV MOD A2C:     68.0 ml LV SV MOD A4C:     192.0 ml LV SV MOD BP:      66.1 ml RIGHT VENTRICLE RV S prime:     12.20 cm/s TAPSE (M-mode): 2.7 cm LEFT ATRIUM             Index       RIGHT ATRIUM            Index LA diam:        3.80 cm 1.60 cm/m  RA Area:     19.40 cm LA Vol (A2C):   64.7 ml 27.19 ml/m RA Volume:   49.00 ml  20.59 ml/m LA Vol (A4C):   59.2 ml 24.88 ml/m LA Biplane Vol: 64.6 ml 27.14 ml/m  AORTIC VALVE LVOT Vmax:   76.60 cm/s LVOT Vmean:  53.000 cm/s LVOT VTI:    0.153 m  AORTA Ao Root diam: 3.10 cm Ao Asc diam:  2.70 cm MITRAL VALVE                        TRICUSPID VALVE MV Area (PHT): 3.85 cm             TR Peak grad:   25.6 mmHg MV PHT:        57.13 msec           TR Vmax:        253.00 cm/s MV Decel Time: 197 msec MV E velocity: 74.70 cm/s 103 cm/s  SHUNTS MV A velocity: 56.90 cm/s 70.3 cm/s Systemic VTI:  0.15 m MV E/A ratio:  1.31       1.5       Systemic Diam: 2.20 cm  Jodelle Red MD Electronically signed by Jodelle Red MD Signature Date/Time: 07/26/2019/2:23:11 PM    Final     Microbiology: Recent Results (from the past 240 hour(s))  SARS CORONAVIRUS 2 (TAT 6-24 HRS) Nasopharyngeal Nasopharyngeal Swab     Status: None   Collection Time: 07/26/19  4:34 AM   Specimen: Nasopharyngeal Swab  Result Value Ref Range Status   SARS Coronavirus 2 NEGATIVE NEGATIVE Final    Comment: (NOTE) SARS-CoV-2 target nucleic acids are NOT DETECTED. The SARS-CoV-2 RNA is generally detectable in upper and lower respiratory specimens during the acute phase of infection. Negative results do not preclude SARS-CoV-2 infection, do not rule out co-infections with other pathogens, and should not be used as the sole basis for treatment or other patient management decisions. Negative results must be combined with clinical observations, patient history, and epidemiological information. The expected result is Negative. Fact Sheet for Patients: HairSlick.no Fact Sheet for Healthcare Providers: quierodirigir.com This test is not yet approved or cleared by the [place] FDA and  has been authorized for detection  and/or  diagnosis of SARS-CoV-2 by FDA under an Emergency Use Authorization (EUA). This EUA will remain  in effect (meaning this test can be used) for the duration of the COVID-19 declaration under Section 56 4(b)(1) of the Act, 21 U.S.C. section 360bbb-3(b)(1), unless the authorization is terminated or revoked sooner. Performed at [facility], [place] [address], [place], [place] [address]      Labs: Basic Metabolic Panel: Recent Labs  Lab 07/26/19 0111 07/26/19 1700 07/27/19 0410  NA 138  --   --   K 3.5  --   --   CL 103  --   --   CO2 24  --   --   GLUCOSE 107*  --   --   BUN 5*  --   --   CREATININE 0.99  --   --   CALCIUM 8.9  --   --   MG  --  2.1  --   PHOS  --   --  4.7*   Liver Function Tests: No results for input(s): AST, ALT, ALKPHOS, BILITOT, PROT, ALBUMIN in the last 168 hours. No results for input(s): LIPASE, AMYLASE in the last 168 hours. No results for input(s): AMMONIA in the last 168 hours. CBC: Recent Labs  Lab 07/26/19 0111 07/27/19 0410  WBC 13.8* 9.5  NEUTROABS  --  5.6  HGB 14.5 13.0  HCT 43.9 39.9  MCV 87.3 88.5  PLT 253 228   Cardiac Enzymes: Recent Labs  Lab 07/26/19 0805  CKTOTAL 239   BNP: BNP (last 3 results) Recent Labs    01/18/19 0109 05/04/19 1138  BNP 38.5 78.1    ProBNP (last 3 results) No results for input(s): PROBNP in the last 8760 hours.  CBG: Recent Labs  Lab 07/26/19 0248 07/26/19 1249 07/26/19 1625 07/27/19 0609  GLUCAP 100* 198* 76 179*       Signed:  Radene Gunning NP [facility] 07/27/2019, 11:01 AM

## 2019-09-05 ENCOUNTER — Emergency Department (HOSPITAL_COMMUNITY)
Admission: EM | Admit: 2019-09-05 | Discharge: 2019-09-07 | Disposition: A | Discharge status: Home / Self Care | Payer: Medicare Other | Attending: Emergency Medicine | Admitting: Emergency Medicine

## 2019-09-05 ENCOUNTER — Encounter (HOSPITAL_COMMUNITY): Payer: Self-pay | Admitting: Emergency Medicine

## 2019-09-05 ENCOUNTER — Other Ambulatory Visit: Payer: Self-pay

## 2019-09-05 DIAGNOSIS — Z95810 Presence of automatic (implantable) cardiac defibrillator: Secondary | ICD-10-CM | POA: Diagnosis not present

## 2019-09-05 DIAGNOSIS — F332 Major depressive disorder, recurrent severe without psychotic features: Secondary | ICD-10-CM | POA: Diagnosis not present

## 2019-09-05 DIAGNOSIS — Z79899 Other long term (current) drug therapy: Secondary | ICD-10-CM | POA: Diagnosis not present

## 2019-09-05 DIAGNOSIS — I110 Hypertensive heart disease with heart failure: Secondary | ICD-10-CM | POA: Diagnosis not present

## 2019-09-05 DIAGNOSIS — F329 Major depressive disorder, single episode, unspecified: Secondary | ICD-10-CM | POA: Diagnosis present

## 2019-09-05 DIAGNOSIS — I5022 Chronic systolic (congestive) heart failure: Secondary | ICD-10-CM | POA: Insufficient documentation

## 2019-09-05 DIAGNOSIS — Z794 Long term (current) use of insulin: Secondary | ICD-10-CM | POA: Insufficient documentation

## 2019-09-05 DIAGNOSIS — R45851 Suicidal ideations: Secondary | ICD-10-CM | POA: Diagnosis not present

## 2019-09-05 DIAGNOSIS — E119 Type 2 diabetes mellitus without complications: Secondary | ICD-10-CM | POA: Diagnosis not present

## 2019-09-05 DIAGNOSIS — F17210 Nicotine dependence, cigarettes, uncomplicated: Secondary | ICD-10-CM | POA: Insufficient documentation

## 2019-09-05 DIAGNOSIS — F1420 Cocaine dependence, uncomplicated: Secondary | ICD-10-CM | POA: Diagnosis present

## 2019-09-05 DIAGNOSIS — F1410 Cocaine abuse, uncomplicated: Secondary | ICD-10-CM | POA: Diagnosis present

## 2019-09-05 DIAGNOSIS — Z91040 Latex allergy status: Secondary | ICD-10-CM | POA: Diagnosis not present

## 2019-09-05 DIAGNOSIS — Z20822 Contact with and (suspected) exposure to covid-19: Secondary | ICD-10-CM | POA: Diagnosis not present

## 2019-09-05 DIAGNOSIS — F1024 Alcohol dependence with alcohol-induced mood disorder: Secondary | ICD-10-CM | POA: Diagnosis not present

## 2019-09-05 DIAGNOSIS — F1010 Alcohol abuse, uncomplicated: Secondary | ICD-10-CM | POA: Diagnosis present

## 2019-09-05 MED ORDER — THIAMINE HCL 100 MG PO TABS
100.0000 mg | ORAL_TABLET | Freq: Every day | ORAL | Status: DC
  Administered 2019-09-06 – 2019-09-07 (×2): 100 mg via ORAL
  Filled 2019-09-05 (×2): qty 1

## 2019-09-05 MED ORDER — ONDANSETRON HCL 4 MG PO TABS: 4.0000 mg | ORAL_TABLET | Freq: Three times a day (TID) | ORAL | Status: DC | PRN

## 2019-09-05 MED ORDER — LORAZEPAM 2 MG/ML IJ SOLN: 0.0000 mg | Freq: Four times a day (QID) | INTRAMUSCULAR | Status: DC

## 2019-09-05 MED ORDER — PANTOPRAZOLE SODIUM 40 MG PO TBEC
40.0000 mg | DELAYED_RELEASE_TABLET | Freq: Two times a day (BID) | ORAL | Status: DC
  Administered 2019-09-06 – 2019-09-07 (×4): 40 mg via ORAL
  Filled 2019-09-05 (×3): qty 1

## 2019-09-05 MED ORDER — SERTRALINE HCL 50 MG PO TABS
50.0000 mg | ORAL_TABLET | Freq: Three times a day (TID) | ORAL | Status: DC
  Administered 2019-09-06 – 2019-09-07 (×5): 50 mg via ORAL
  Filled 2019-09-05 (×5): qty 1

## 2019-09-05 MED ORDER — LORAZEPAM 2 MG/ML IJ SOLN: 0.0000 mg | Freq: Two times a day (BID) | INTRAMUSCULAR | Status: DC

## 2019-09-05 MED ORDER — ROSUVASTATIN CALCIUM 20 MG PO TABS
20.0000 mg | ORAL_TABLET | Freq: Every day | ORAL | Status: DC
  Administered 2019-09-06 – 2019-09-07 (×2): 20 mg via ORAL
  Filled 2019-09-05 (×2): qty 1

## 2019-09-05 MED ORDER — THIAMINE HCL 100 MG/ML IJ SOLN: 100.0000 mg | Freq: Every day | INTRAMUSCULAR | Status: DC

## 2019-09-05 MED ORDER — CLOPIDOGREL BISULFATE 75 MG PO TABS
75.0000 mg | ORAL_TABLET | Freq: Every day | ORAL | Status: DC
  Administered 2019-09-06 – 2019-09-07 (×2): 75 mg via ORAL
  Filled 2019-09-05 (×2): qty 1

## 2019-09-05 MED ORDER — LORAZEPAM 1 MG PO TABS
0.0000 mg | ORAL_TABLET | Freq: Four times a day (QID) | ORAL | Status: DC
  Administered 2019-09-06 (×3): 1 mg via ORAL
  Filled 2019-09-05 (×2): qty 1

## 2019-09-05 MED ORDER — DIVALPROEX SODIUM 500 MG PO DR TAB
500.0000 mg | DELAYED_RELEASE_TABLET | Freq: Two times a day (BID) | ORAL | Status: DC
  Administered 2019-09-06 – 2019-09-07 (×4): 500 mg via ORAL
  Filled 2019-09-05 (×4): qty 1

## 2019-09-05 MED ORDER — TRAZODONE HCL 100 MG PO TABS
200.0000 mg | ORAL_TABLET | Freq: Every day | ORAL | Status: DC
  Administered 2019-09-06 (×2): 200 mg via ORAL
  Filled 2019-09-05 (×2): qty 2

## 2019-09-05 MED ORDER — FOLIC ACID 1 MG PO TABS
1.0000 mg | ORAL_TABLET | Freq: Every day | ORAL | Status: DC
  Administered 2019-09-06 – 2019-09-07 (×2): 1 mg via ORAL
  Filled 2019-09-05 (×2): qty 1

## 2019-09-05 MED ORDER — LORAZEPAM 1 MG PO TABS: 0.0000 mg | ORAL_TABLET | Freq: Two times a day (BID) | ORAL | Status: DC

## 2019-09-05 MED ORDER — ISOSORBIDE MONONITRATE ER 30 MG PO TB24
30.0000 mg | ORAL_TABLET | Freq: Every day | ORAL | Status: DC
  Administered 2019-09-06 – 2019-09-07 (×2): 30 mg via ORAL
  Filled 2019-09-05 (×2): qty 1

## 2019-09-05 MED ORDER — PRAZOSIN HCL 1 MG PO CAPS
2.0000 mg | ORAL_CAPSULE | Freq: Every day | ORAL | Status: DC
  Administered 2019-09-06 (×2): 2 mg via ORAL
  Filled 2019-09-05 (×3): qty 2

## 2019-09-05 MED ORDER — NICOTINE 21 MG/24HR TD PT24
21.0000 mg | MEDICATED_PATCH | Freq: Every day | TRANSDERMAL | Status: DC
  Filled 2019-09-05: qty 1

## 2019-09-05 NOTE — ED Notes (Signed)
PT have one brown wallet one cell phone

## 2019-09-05 NOTE — ED Notes (Signed)
One blue one gold in main lab

## 2019-09-05 NOTE — ED Triage Notes (Signed)
Patient comes from home by [facility]. Patient told ems he wants to kill himself. Patient is non compliant with medications. Patient has been on a drinking binge for two days.

## 2019-09-05 NOTE — ED Notes (Signed)
PT aware of urine sample 

## 2019-09-06 DIAGNOSIS — F332 Major depressive disorder, recurrent severe without psychotic features: Secondary | ICD-10-CM | POA: Diagnosis not present

## 2019-09-06 LAB — COMPREHENSIVE METABOLIC PANEL
ALT: 19 U/L (ref 0–44)
AST: 19 U/L (ref 15–41)
Albumin: 3.9 g/dL (ref 3.5–5.0)
Alkaline Phosphatase: 49 U/L (ref 38–126)
Anion gap: 12 (ref 5–15)
BUN: 7 mg/dL (ref 6–20)
CO2: 25 mmol/L (ref 22–32)
Calcium: 8.9 mg/dL (ref 8.9–10.3)
Chloride: 102 mmol/L (ref 98–111)
Creatinine, Ser: 0.79 mg/dL (ref 0.61–1.24)
GFR calc Af Amer: >60 mL/min (ref >60)
GFR calc non Af Amer: >60 mL/min (ref >60)
Glucose, Bld: 229 mg/dL — ABNORMAL HIGH (ref 70–99)
Potassium: 3.9 mmol/L (ref 3.5–5.1)
Sodium: 139 mmol/L (ref 135–145)
Total Bilirubin: 0.8 mg/dL (ref 0.3–1.2)
Total Protein: 7.2 g/dL (ref 6.5–8.1)

## 2019-09-06 LAB — CBC
HCT: 44.7 % (ref 39.0–52.0)
Hemoglobin: 14.6 g/dL (ref 13.0–17.0)
MCH: 28.9 pg (ref 26.0–34.0)
MCHC: 32.7 g/dL (ref 30.0–36.0)
MCV: 88.5 fL (ref 80.0–100.0)
Platelets: 225 10*3/uL (ref 150–400)
RBC: 5.05 MIL/uL (ref 4.22–5.81)
RDW: 13.8 % (ref 11.5–15.5)
WBC: 11.9 10*3/uL — ABNORMAL HIGH (ref 4.0–10.5)
nRBC: 0 % (ref 0.0–0.2)

## 2019-09-06 LAB — CBG MONITORING, ED
Glucose-Capillary: 239 mg/dL — ABNORMAL HIGH (ref 70–99)
Glucose-Capillary: 333 mg/dL — ABNORMAL HIGH (ref 70–99)
Glucose-Capillary: 297 mg/dL — ABNORMAL HIGH (ref 70–99)

## 2019-09-06 LAB — RAPID URINE DRUG SCREEN, HOSP PERFORMED
Amphetamines: NOT DETECTED
Barbiturates: NOT DETECTED
Benzodiazepines: NOT DETECTED
Cocaine: POSITIVE — AB
Opiates: NOT DETECTED
Tetrahydrocannabinol: NOT DETECTED

## 2019-09-06 LAB — VALPROIC ACID LEVEL: Valproic Acid Lvl: <10 ug/mL — ABNORMAL LOW (ref 50.0–100.0)

## 2019-09-06 LAB — SALICYLATE LEVEL: Salicylate Lvl: <7 mg/dL — ABNORMAL LOW (ref 7.0–30.0)

## 2019-09-06 LAB — ACETAMINOPHEN LEVEL: Acetaminophen (Tylenol), Serum: <10 ug/mL — ABNORMAL LOW (ref 10–30)

## 2019-09-06 LAB — ETHANOL: Alcohol, Ethyl (B): <10 mg/dL (ref <10)

## 2019-09-06 LAB — SARS CORONAVIRUS 2 (TAT 6-24 HRS): SARS Coronavirus 2: NEGATIVE

## 2019-09-06 MED ORDER — ACETAMINOPHEN 325 MG PO TABS
650.0000 mg | ORAL_TABLET | Freq: Four times a day (QID) | ORAL | Status: DC | PRN
  Administered 2019-09-06: 650 mg via ORAL
  Filled 2019-09-06 (×2): qty 2

## 2019-09-06 MED ORDER — INSULIN ASPART PROT & ASPART (70-30 MIX) 100 UNIT/ML ~~LOC~~ SUSP
60.0000 [IU] | Freq: Three times a day (TID) | SUBCUTANEOUS | Status: DC
  Administered 2019-09-07 (×2): 60 [IU] via SUBCUTANEOUS
  Filled 2019-09-06: qty 10

## 2019-09-06 MED ORDER — LIRAGLUTIDE 18 MG/3ML ~~LOC~~ SOPN: 1.8000 mg | PEN_INJECTOR | Freq: Every day | SUBCUTANEOUS | Status: DC

## 2019-09-06 NOTE — ED Provider Notes (Signed)
[facility] DEPT Provider Note   CSN: [id number] Arrival date & time: 09/05/19  2307     History Chief Complaint  Patient presents with  . Suicidal    Luis [name] is a 49 y.o. male.  The history is provided by the patient.  Mental Health Problem Presenting symptoms: suicidal thoughts   Degree of incapacity (severity):  Moderate Onset quality:  Gradual Timing:  Constant Progression:  Worsening Chronicity:  New Relieved by:  Nothing Worsened by:  Nothing Associated symptoms: headaches   Associated symptoms: no abdominal pain and no chest pain   Patient presents with suicidal thoughts.  He presents via EMS.  He reports he had thoughts of harming self but has not tried yet.  He reports increasing drinking ETOH over the past 2 days.  He reports he has a headache and is hungry.  He has no other acute complaints Patient does have an ICD in place, but denies any recent shocks     Past Medical History:  Diagnosis Date  . Anxiety   . Biventricular ICD (implantable cardioverter-defibrillator) in place   . Chronic systolic CHF (congestive heart failure) ([place])   . Cocaine use   . Diabetes mellitus without complication ([place])   . GERD (gastroesophageal reflux disease)   . HLD (hyperlipidemia)   . Hypertension   . Morbid obesity ([place])   . NICM (nonischemic cardiomyopathy) ([place])   . OSA (obstructive sleep apnea)   . PTSD (post-traumatic stress disorder)    on Depakote  . Refusal of blood transfusions as patient is Jehovah's Witness   . Tobacco abuse     Patient Active Problem List   Diagnosis Date Noted  . Leukocytosis 07/26/2019  . Nonischemic cardiomyopathy ([place])   . Acute respiratory failure with hypoxia ([place]) 05/05/2019  . Rhinovirus infection 05/05/2019  . Morbid obesity ([place]) 05/05/2019  . Suspected COVID-19 virus infection 05/05/2019  . Acute intractable headache   . Cocaine abuse ([place])   . Acute non intractable tension-type  headache   . Blurred vision, bilateral   . Chronic systolic CHF (congestive heart failure) ([place]) 01/17/2019  . Pacemaker   . HLD (hyperlipidemia)   . Diabetes mellitus without complication ([place])   . Hypertension   . GERD (gastroesophageal reflux disease)   . Chest pain   . Tobacco abuse   . Alcohol abuse     Past Surgical History:  Procedure Laterality Date  . BIV ICD INSERTION CRT-D    . FOOT FRACTURE SURGERY    . ROTATOR CUFF REPAIR    . TONSILLECTOMY         Family History  Problem Relation Age of Onset  . Hypertension Mother   . Bone cancer Father   . Lung cancer Father     Social History   Tobacco Use  . Smoking status: Current Every Day Smoker    Types: Cigarettes  . Smokeless tobacco: Never Used  Substance Use Topics  . Alcohol use: Yes  . Drug use: Yes    Types: Cocaine    Home Medications Prior to Admission medications   Medication Sig Start Date End Date Taking? Authorizing Provider  clopidogrel (PLAVIX) 75 MG tablet Take 75 mg by mouth daily. 01/13/19  Yes [provider]  divalproex (DEPAKOTE) 500 MG DR tablet Take 500 mg by mouth 2 (two) times a day. 06/30/18  Yes [provider]  furosemide (LASIX) 80 MG tablet Take 80 mg by mouth daily. 09/13/18  Yes [provider]  HUMALOG MIX 75/25 KWIKPEN (75-25) 100 UNIT/ML Kwikpen Inject 60 Units into the skin 3 (three) times daily before meals.  01/13/19  Yes [provider]  isosorbide mononitrate (IMDUR) 30 MG 24 hr tablet Take 1 tablet (30 mg total) by mouth daily. 07/28/19  Yes Black, Lesle Chris, NP  metoprolol succinate (TOPROL-XL) 100 MG 24 hr tablet Take 100 mg by mouth daily. Take with or immediately following a meal.   Yes [provider]  pantoprazole (PROTONIX) 40 MG tablet Take 1 tablet (40 mg total) by mouth 2 (two) times daily. 07/27/19  Yes Vann, Jessica U, DO  prazosin (MINIPRESS) 2 MG capsule Take 2 mg by mouth at bedtime. 10/28/18  Yes [provider]   rosuvastatin (CRESTOR) 20 MG tablet Take 20 mg by mouth daily. 09/21/18  Yes [provider]  sertraline (ZOLOFT) 50 MG tablet Take 150 mg by mouth daily.  10/28/18  Yes [provider]  traZODone (DESYREL) 100 MG tablet Take 200 mg by mouth at bedtime.  12/19/18  Yes [provider]  VICTOZA 18 MG/3ML SOPN Inject 1.8 mg into the skin daily before breakfast.  01/09/19  Yes [provider]    Allergies    Aspirin, Iodine-131, Iodinated diagnostic agents, Latex, Penicillins, Amoxicillin-pot clavulanate, and Lisinopril  Review of Systems   Review of Systems  Constitutional: Negative for fever.  Cardiovascular: Negative for chest pain.  Gastrointestinal: Negative for abdominal pain.  Neurological: Positive for headaches.  Psychiatric/Behavioral: Positive for suicidal ideas.  All other systems reviewed and are negative.   Physical Exam Updated Vital Signs BP (!) 146/73 (BP Location: Left Arm)   Pulse 82   Temp 98.2 F (36.8 C) (Oral)   Resp 18   SpO2 93%   Physical Exam CONSTITUTIONAL: Well developed, sitting up in no acute distress HEAD: Normocephalic/atraumatic EYES: EOMI NECK: supple no meningeal signs SPINE/BACK:entire spine nontender CV: S1/S2 noted, no murmurs/rubs/gallops noted LUNGS: Lungs are clear to auscultation bilaterally, no apparent distress ABDOMEN: soft, nontender obese NEURO: Pt is awake/alert/appropriate, moves all extremitiesx4.  No facial droop.   EXTREMITIES:full ROM SKIN: warm, color normal PSYCH: Poor eye contact ED Results / Procedures / Treatments   Labs (all labs ordered are listed, but only abnormal results are displayed) Labs Reviewed  COMPREHENSIVE METABOLIC PANEL - Abnormal; Notable for the following components:      Result Value   Glucose, Bld 229 (*)    All other components within normal limits  SALICYLATE LEVEL - Abnormal; Notable for the following components:   Salicylate Lvl <7.0 (*)    All other  components within normal limits  ACETAMINOPHEN LEVEL - Abnormal; Notable for the following components:   Acetaminophen (Tylenol), Serum <10 (*)    All other components within normal limits  CBC - Abnormal; Notable for the following components:   WBC 11.9 (*)    All other components within normal limits  VALPROIC ACID LEVEL - Abnormal; Notable for the following components:   Valproic Acid Lvl <10 (*)    All other components within normal limits  SARS CORONAVIRUS 2 (TAT 6-24 HRS)  ETHANOL  RAPID URINE DRUG SCREEN, HOSP PERFORMED    EKG None  Radiology No results found.  Procedures Procedures   Medications Ordered in ED Medications  LORazepam (ATIVAN) injection 0-4 mg ( Intravenous See Alternative 09/06/19 0136)    Or  LORazepam (ATIVAN) tablet 0-4 mg (1 mg Oral Given 09/06/19 0136)  LORazepam (ATIVAN) injection 0-4 mg (has no administration  in time range)    Or  LORazepam (ATIVAN) tablet 0-4 mg (has no administration in time range)  thiamine tablet 100 mg (has no administration in time range)    Or  thiamine (B-1) injection 100 mg (has no administration in time range)  nicotine (NICODERM CQ - dosed in mg/24 hours) patch 21 mg (has no administration in time range)  ondansetron (ZOFRAN) tablet 4 mg (has no administration in time range)  clopidogrel (PLAVIX) tablet 75 mg (has no administration in time range)  divalproex (DEPAKOTE) DR tablet 500 mg (500 mg Oral Given 09/06/19 0147)  folic acid (FOLVITE) tablet 1 mg (has no administration in time range)  isosorbide mononitrate (IMDUR) 24 hr tablet 30 mg (has no administration in time range)  pantoprazole (PROTONIX) EC tablet 40 mg (40 mg Oral Given 09/06/19 0137)  prazosin (MINIPRESS) capsule 2 mg (2 mg Oral Given 09/06/19 0136)  rosuvastatin (CRESTOR) tablet 20 mg (has no administration in time range)  traZODone (DESYREL) tablet 200 mg (200 mg Oral Given 09/06/19 0139)  sertraline (ZOLOFT) tablet 50 mg (50 mg Oral Given 09/06/19 0138)   acetaminophen (TYLENOL) tablet 650 mg (650 mg Oral Given 09/06/19 0206)    ED Course  I have reviewed the triage vital signs and the nursing notes.  Pertinent labs  results that were available during my care of the patient were reviewed by me and considered in my medical decision making (see chart for details).    MDM Rules/Calculators/A&P                     2:46 AM Patient presents for suicidal ideation.  Overall patient is medically stable in no acute distress.  Home medications have been ordered.  Patient is awaiting psych evaluation   The patient has been placed in psychiatric observation due to the need to provide a safe environment for the patient while obtaining psychiatric consultation and evaluation, as well as ongoing medical and medication management to treat the patient's condition.  The patient has not been placed under full IVC at this time.  Final Clinical Impression(s) / ED Diagnoses Final diagnoses:  Suicidal ideation    Rx / DC Orders ED Discharge Orders    None       Zadie Rhine, MD 09/06/19 [phone]

## 2019-09-06 NOTE — ED Notes (Signed)
Patient moved to room and TTS cart at bedside for consult.

## 2019-09-06 NOTE — Progress Notes (Signed)
Pt. set up with CPAP for h/s use, RN aware, no other equipment left in room, Sterile Water bottle left with RN, made aware to notify if needed.

## 2019-09-06 NOTE — Patient Outreach (Signed)
ED Peer Support Specialist Patient Intake (Complete at intake & 30-60 Day Follow-up)  Name: Luis [name]  MRN: [id number]  Age: 49 y.o.   Date of Admission: 09/06/2019  Intake:   Comments:      Primary Reason Admitted: Suicidal ideation   Lab values: Alcohol/ETOH: Positive Positive UDS? No Amphetamines: No Barbiturates: No Benzodiazepines: No Cocaine: Yes Opiates: No Cannabinoids: No  Demographic information: Gender: Male Ethnicity: African American Marital Status: Single Insurance Status: Medicaid Ecologist (Work Neurosurgeon, Physicist, medical, etc.: Yes([employer] foodstamps) Lives with: Alone Living situation: House/Apartment  Reported Patient History: Patient reported health conditions: Anxiety disorders, Depression, Other (comment)(PTSD) Patient aware of HIV and hepatitis status: No  In past year, has patient visited ED for any reason? Yes  Number of ED visits: 1  Reason(s) for visit: chest Pain  In past year, has patient been hospitalized for any reason? No  Number of hospitalizations:    Reason(s) for hospitalization:    In past year, has patient been arrested? No  Number of arrests:    Reason(s) for arrest:    In past year, has patient been incarcerated? No  Number of incarcerations:    Reason(s) for incarceration:    In past year, has patient received medication-assisted treatment? No  In past year, patient received the following treatments:    In past year, has patient received any harm reduction services? No  Did this include any of the following?    In past year, has patient received care from a mental health provider for diagnosis other than SUD? No  In past year, is this first time patient has overdosed? No  Number of past overdoses:    In past year, is this first time patient has been hospitalized for an overdose? No  Number of hospitalizations for overdose(s):    Is patient currently receiving treatment  for a mental health diagnosis? Yes  Patient reports experiencing difficulty participating in SUD treatment: No    Most important reason(s) for this difficulty?    Has patient received prior services for treatment? No  In past, patient has received services from following agencies:    Plan of Care:  Suggested follow up at these agencies/treatment centers: Other (comment)([facility])  Other information:  CPSS met with Pt an was able to complete series of questions to better assist Pt. CPSS was able to gain information as to what services Pt is seeking. Pt stated that he wants to get himself stable an begin to think clearly. Pt mentioned that he wants to get into a facility to get back on his medications.  Consent formed signed an [facility] is faxing information to to [employer].    Aaron Edelman Chiron Campione, CPSS  09/06/2019 1:57 PM

## 2019-09-06 NOTE — BH Assessment (Signed)
Clinician contacted pt's nurse, Victorino Dike RN, requesting pt be moved to a room so pt's BH Assessment can be completed. Pt's nurse stated there are currently no open rooms in which to place pt to complete his assessment in a confidential manner. Pt's nurse states she will call [facility] when pt is moved to a room and his assessment can be completed in confidence.

## 2019-09-06 NOTE — ED Notes (Signed)
Pt called out asking to use the urinal because "someone asked him for a piss sample". Pt encouraged to get up and ambulate to the BR; pt requested for writer to carry him to the BR. Pt provided with urinal to provide urine sample.

## 2019-09-06 NOTE — BH Assessment (Signed)
[facility] Assessment Progress Note  Per Berneice Heinrich, FNP, this voluntary pt requires psychiatric hospitalization at this time.  The following facilities have been contacted to seek placement for this pt, with results as noted:  Beds available, information sent, decision pending: [facility]  At capacity: [facility] Alanda Slim, [place] [facility] Coordinator [phone]

## 2019-09-06 NOTE — ED Notes (Signed)
Patient encouraged to get up and attempt to provide a urine sample. Patient declines.

## 2019-09-06 NOTE — ED Notes (Signed)
Pt arrived to unit calm and cooperative.  15 minute checks and video monitoring in place.
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
## 2019-09-06 NOTE — ED Notes (Signed)
To nurses station to request a CPAP. States he has used one since he was 49 yo and without it he cant sleep and wakes up choking. States he notified day nurse re it, but this Clinical research associate doesn't see any cpap machine on the unit nor is there an order for it. Notified Dr for an order and once there was an order called respiratory therapy for the machine, Respiratory therapist came and set up the machine and he is wearing it and resting quietly at this time.

## 2019-09-06 NOTE — ED Notes (Signed)
Pt speaking with TTS at this time.  

## 2019-09-06 NOTE — Consult Note (Signed)
[facility] Psych ED Progress Note  09/06/2019 1:10 PM Luis [name]  MRN:  [id number] Subjective: Patient assessed by nurse practitioner.  Patient alert and oriented, answers appropriately. Patient states "my brain is sick."  Patient reports "I was on a 3-day binge of alcohol and cocaine yesterday." Patient denies suicidal ideations.  Patient reports history of 4 prior suicide attempts, last in 2018.  Patient denies homicidal ideations.  Patient denies auditory and visual hallucinations.  Patient denies symptoms of paranoia, patient does not appear to be responding to internal stimuli. Patient treated outpatient by [facility] for bipolar disorder.  Patient reports average appetite but "sleeping only 3 hours per night for the last 3 years."  Patient reports lives alone in an apartment in [place].  Patient denies access to weapons.  Patient reports he is disabled related to his history of chronic heart failure.  Patient reports noncompliance with all medications at times, including psychotropic medications.  Patient reports use of "moonshine, corn liquor" and cocaine."  Patient reports using alcohol and cocaine approximately once a week but recently has been experiencing 3-day binges of use.  Patient reports history of substance use rehab, last in 1996.   Principal Problem: Cocaine abuse (HCC) Diagnosis:  Principal Problem:   Cocaine abuse (HCC) Active Problems:   Alcohol abuse  Total Time spent with patient: 30 minutes  Past Psychiatric History: Alcohol use disorder, cocaine use disorder, bipolar disorder  Past Medical History:  Past Medical History:  Diagnosis Date  . Anxiety   . Biventricular ICD (implantable cardioverter-defibrillator) in place   . Chronic systolic CHF (congestive heart failure) (HCC)   . Cocaine use   . Diabetes mellitus without complication (HCC)   . GERD (gastroesophageal reflux disease)   . HLD (hyperlipidemia)   . Hypertension   . Morbid obesity (HCC)   .  NICM (nonischemic cardiomyopathy) (HCC)   . OSA (obstructive sleep apnea)   . PTSD (post-traumatic stress disorder)    on Depakote  . Refusal of blood transfusions as patient is Jehovah's Witness   . Tobacco abuse     Past Surgical History:  Procedure Laterality Date  . BIV ICD INSERTION CRT-D    . FOOT FRACTURE SURGERY    . ROTATOR CUFF REPAIR    . TONSILLECTOMY     Family History:  Family History  Problem Relation Age of Onset  . Hypertension Mother   . Bone cancer Father   . Lung cancer Father    Family Psychiatric  History: Denies Social History:  Social History   Substance and Sexual Activity  Alcohol Use Yes     Social History   Substance and Sexual Activity  Drug Use Yes  . Types: Cocaine    Social History   Socioeconomic History  . Marital status: Legally Separated    Spouse name: Not on file  . Number of children: Not on file  . Years of education: Not on file  . Highest education level: Not on file  Occupational History  . Not on file  Tobacco Use  . Smoking status: Current Every Day Smoker    Types: Cigarettes  . Smokeless tobacco: Never Used  Substance and Sexual Activity  . Alcohol use: Yes  . Drug use: Yes    Types: Cocaine  . Sexual activity: Not on file  Other Topics Concern  . Not on file  Social History Narrative  . Not on file   Social Determinants of Health   Financial Resource Strain:   .  Difficulty of Paying Living Expenses: Not on file  Food Insecurity:   . Worried About Programme researcher, broadcasting/film/video in the Last Year: Not on file  . Ran Out of Food in the Last Year: Not on file  Transportation Needs:   . Lack of Transportation (Medical): Not on file  . Lack of Transportation (Non-Medical): Not on file  Physical Activity:   . Days of Exercise per Week: Not on file  . Minutes of Exercise per Session: Not on file  Stress:   . Feeling of Stress : Not on file  Social Connections:   . Frequency of Communication with Friends and Family:  Not on file  . Frequency of Social Gatherings with Friends and Family: Not on file  . Attends Religious Services: Not on file  . Active Member of Clubs or Organizations: Not on file  . Attends Banker Meetings: Not on file  . Marital Status: Not on file    Sleep: Poor  Appetite:  Good  Current Medications: Current Facility-Administered Medications  Medication Dose Route Frequency Provider Last Rate Last Admin  . acetaminophen (TYLENOL) tablet 650 mg  650 mg Oral Q6H PRN Zadie Rhine, MD   650 mg at 09/06/19 0206  . clopidogrel (PLAVIX) tablet 75 mg  75 mg Oral Daily Zadie Rhine, MD   75 mg at 09/06/19 1034  . divalproex (DEPAKOTE) DR tablet 500 mg  500 mg Oral Q12H Zadie Rhine, MD   500 mg at 09/06/19 1033  . folic acid (FOLVITE) tablet 1 mg  1 mg Oral Daily Zadie Rhine, MD   1 mg at 09/06/19 1034  . isosorbide mononitrate (IMDUR) 24 hr tablet 30 mg  30 mg Oral Daily Zadie Rhine, MD   30 mg at 09/06/19 1034  . LORazepam (ATIVAN) injection 0-4 mg  0-4 mg Intravenous Q6H Zadie Rhine, MD       Or  . LORazepam (ATIVAN) tablet 0-4 mg  0-4 mg Oral Q6H Zadie Rhine, MD   1 mg at 09/06/19 0136  . [START ON 09/08/2019] LORazepam (ATIVAN) injection 0-4 mg  0-4 mg Intravenous Q12H Zadie Rhine, MD       Or  . Melene Muller ON 09/08/2019] LORazepam (ATIVAN) tablet 0-4 mg  0-4 mg Oral Q12H Zadie Rhine, MD      . nicotine (NICODERM CQ - dosed in mg/24 hours) patch 21 mg  21 mg Transdermal Daily Zadie Rhine, MD      . ondansetron [facility]) tablet 4 mg  4 mg Oral Q8H PRN Zadie Rhine, MD      . pantoprazole (PROTONIX) EC tablet 40 mg  40 mg Oral BID Zadie Rhine, MD   40 mg at 09/06/19 1034  . prazosin (MINIPRESS) capsule 2 mg  2 mg Oral QHS Zadie Rhine, MD   2 mg at 09/06/19 0136  . rosuvastatin (CRESTOR) tablet 20 mg  20 mg Oral Daily Zadie Rhine, MD   20 mg at 09/06/19 1034  . sertraline (ZOLOFT) tablet 50 mg  50 mg Oral TID  Zadie Rhine, MD   50 mg at 09/06/19 1034  . thiamine tablet 100 mg  100 mg Oral Daily Zadie Rhine, MD   100 mg at 09/06/19 1034   Or  . thiamine (B-1) injection 100 mg  100 mg Intravenous Daily Zadie Rhine, MD      . traZODone (DESYREL) tablet 200 mg  200 mg Oral QHS Zadie Rhine, MD   200 mg at 09/06/19 0139   Current Outpatient  Medications  Medication Sig Dispense Refill  . clopidogrel (PLAVIX) 75 MG tablet Take 75 mg by mouth daily.    . divalproex (DEPAKOTE) 500 MG DR tablet Take 500 mg by mouth 2 (two) times a day.    . furosemide (LASIX) 80 MG tablet Take 80 mg by mouth daily.    Marland Kitchen HUMALOG MIX 75/25 KWIKPEN (75-25) 100 UNIT/ML Kwikpen Inject 60 Units into the skin 3 (three) times daily before meals.     . isosorbide mononitrate (IMDUR) 30 MG 24 hr tablet Take 1 tablet (30 mg total) by mouth daily. 30 tablet 0  . metoprolol succinate (TOPROL-XL) 100 MG 24 hr tablet Take 100 mg by mouth daily. Take with or immediately following a meal.    . pantoprazole (PROTONIX) 40 MG tablet Take 1 tablet (40 mg total) by mouth 2 (two) times daily. 60 tablet 0  . prazosin (MINIPRESS) 2 MG capsule Take 2 mg by mouth at bedtime.    . rosuvastatin (CRESTOR) 20 MG tablet Take 20 mg by mouth daily.    . sertraline (ZOLOFT) 50 MG tablet Take 150 mg by mouth daily.     . traZODone (DESYREL) 100 MG tablet Take 200 mg by mouth at bedtime.     Marland Kitchen VICTOZA 18 MG/3ML SOPN Inject 1.8 mg into the skin daily before breakfast.       Lab Results:  Results for orders placed or performed during the hospital encounter of 09/05/19 (from the past 48 hour(s))  SARS CORONAVIRUS 2 (TAT 6-24 HRS) Nasopharyngeal Nasopharyngeal Swab     Status: None   Collection Time: 09/05/19 11:37 PM   Specimen: Nasopharyngeal Swab  Result Value Ref Range   SARS Coronavirus 2 NEGATIVE NEGATIVE    Comment: (NOTE) SARS-CoV-2 target nucleic acids are NOT DETECTED. The SARS-CoV-2 RNA is generally detectable in upper and  lower respiratory specimens during the acute phase of infection. Negative results do not preclude SARS-CoV-2 infection, do not rule out co-infections with other pathogens, and should not be used as the sole basis for treatment or other patient management decisions. Negative results must be combined with clinical observations, patient history, and epidemiological information. The expected result is Negative. Fact Sheet for Patients: HairSlick.no Fact Sheet for Healthcare Providers: quierodirigir.com This test is not yet approved or cleared by the [place] FDA and  has been authorized for detection and/or diagnosis of SARS-CoV-2 by FDA under an Emergency Use Authorization (EUA). This EUA will remain  in effect (meaning this test can be used) for the duration of the COVID-19 declaration under Section 56 4(b)(1) of the Act, 21 U.S.C. section 360bbb-3(b)(1), unless the authorization is terminated or revoked sooner. Performed at [facility], [address], [place], [place] [address]   Comprehensive metabolic panel     Status: Abnormal   Collection Time: 09/05/19 11:40 PM  Result Value Ref Range   Sodium 139 135 - 145 mmol/L   Potassium 3.9 3.5 - 5.1 mmol/L   Chloride 102 98 - 111 mmol/L   CO2 25 22 - 32 mmol/L   Glucose, Bld 229 (H) 70 - 99 mg/dL    Comment: Glucose reference range applies only to samples taken after fasting for at least 8 hours.   BUN 7 6 - 20 mg/dL   Creatinine, Ser 2.33 0.61 - 1.24 mg/dL   Calcium 8.9 8.9 - 61.2 mg/dL   Total Protein 7.2 6.5 - 8.1 g/dL   Albumin 3.9 3.5 - 5.0 g/dL   AST 19  15 - 41 U/L   ALT 19 0 - 44 U/L   Alkaline Phosphatase 49 38 - 126 U/L   Total Bilirubin 0.8 0.3 - 1.2 mg/dL   GFR calc non Af Amer >60 >60 mL/min   GFR calc Af Amer >60 >60 mL/min   Anion gap 12 5 - 15    Comment: Performed at [facility], [address], [place], [place] [address]   Ethanol     Status: None   Collection Time: 09/05/19 11:40 PM  Result Value Ref Range   Alcohol, Ethyl (B) <10 <10 mg/dL    Comment: (NOTE) Lowest detectable limit for serum alcohol is 10 mg/dL. For medical purposes only. Performed at [facility], [address] [place], [place] [address]   Salicylate level     Status: Abnormal   Collection Time: 09/05/19 11:40 PM  Result Value Ref Range   Salicylate Lvl <7.0 (L) 7.0 - 30.0 mg/dL    Comment: Performed at [facility], [address], [place], [place] [address]  Acetaminophen level     Status: Abnormal   Collection Time: 09/05/19 11:40 PM  Result Value Ref Range   Acetaminophen (Tylenol), Serum <10 (L) 10 - 30 ug/mL    Comment: (NOTE) Therapeutic concentrations vary significantly. A range of 10-30 ug/mL  may be an effective concentration for many patients. However, some  are best treated at concentrations outside of this range. Acetaminophen concentrations >150 ug/mL at 4 hours after ingestion  and >50 ug/mL at 12 hours after ingestion are often associated with  toxic reactions. Performed at [facility], [address], [place], [place] [address]   cbc     Status: Abnormal   Collection Time: 09/05/19 11:40 PM  Result Value Ref Range   WBC 11.9 (H) 4.0 - 10.5 K/uL   RBC 5.05 4.22 - 5.81 MIL/uL   Hemoglobin 14.6 13.0 - 17.0 g/dL   HCT 22.9 79.8 - 92.1 %   MCV 88.5 80.0 - 100.0 fL   MCH 28.9 26.0 - 34.0 pg   MCHC 32.7 30.0 - 36.0 g/dL   RDW 19.4 17.4 - 08.1 %   Platelets 225 150 - 400 K/uL   nRBC 0.0 0.0 - 0.2 %    Comment: Performed at [facility], [address], [place], [place] [address]  Valproic acid level     Status: Abnormal   Collection Time: 09/05/19 11:40 PM  Result Value Ref Range   Valproic Acid Lvl <10 (L) 50.0 - 100.0 ug/mL    Comment: RESULTS CONFIRMED BY MANUAL DILUTION Performed at [facility], [address], [place], [place] [address]   CBG monitoring, ED     Status: Abnormal   Collection Time: 09/06/19  3:17 AM  Result Value Ref Range   Glucose-Capillary 239 (H) 70 - 99 mg/dL    Comment: Glucose reference range applies only to samples taken after fasting for at least 8 hours.  Rapid urine drug screen (hospital performed)     Status: Abnormal   Collection Time: 09/06/19  5:52 AM  Result Value Ref Range   Opiates NONE DETECTED NONE DETECTED   Cocaine POSITIVE (A) NONE DETECTED   Benzodiazepines NONE DETECTED NONE DETECTED   Amphetamines NONE DETECTED NONE DETECTED   Tetrahydrocannabinol NONE DETECTED NONE DETECTED   Barbiturates NONE DETECTED NONE DETECTED    Comment: (NOTE) DRUG SCREEN FOR MEDICAL PURPOSES ONLY.  IF CONFIRMATION IS NEEDED FOR  ANY PURPOSE, NOTIFY LAB WITHIN 5 DAYS. LOWEST DETECTABLE LIMITS FOR URINE DRUG SCREEN Drug Class                     Cutoff (ng/mL) Amphetamine and metabolites    1000 Barbiturate and metabolites    200 Benzodiazepine                 200 Tricyclics and metabolites     300 Opiates and metabolites        300 Cocaine and metabolites        300 THC                            50 Performed at [facility], [address], [place], [place] [address]   CBG monitoring, ED     Status: Abnormal   Collection Time: 09/06/19 12:36 PM  Result Value Ref Range   Glucose-Capillary 333 (H) 70 - 99 mg/dL    Comment: Glucose reference range applies only to samples taken after fasting for at least 8 hours.    Blood Alcohol level:  Lab Results  Component Value Date   ETH <10 09/05/2019    Physical Findings: AIMS:  , ,  ,  ,    CIWA:  CIWA-Ar Total: 0 COWS:     Musculoskeletal: Strength & Muscle Tone: within normal limits Gait & Station: normal Patient leans: N/A  Psychiatric Specialty Exam: Physical Exam Vitals and nursing note reviewed.  Constitutional:      Appearance: He is well-developed.  HENT:     Head:  Normocephalic.  Cardiovascular:     Rate and Rhythm: Normal rate.  Pulmonary:     Effort: Pulmonary effort is normal.  Neurological:     Mental Status: He is alert and oriented to person, place, and time.  Psychiatric:        Attention and Perception: Attention normal.        Mood and Affect: Mood is anxious.        Speech: Speech normal.        Behavior: Behavior normal.        Thought Content: Thought content normal.        Cognition and Memory: Cognition normal.        Judgment: Judgment normal.     Review of Systems  Constitutional: Negative.   HENT: Negative.   Eyes: Negative.   Respiratory: Negative.   Cardiovascular: Negative.   Gastrointestinal: Negative.   Genitourinary: Negative.   Musculoskeletal: Negative.   Skin: Negative.   Neurological: Negative.   Psychiatric/Behavioral: Positive for sleep disturbance.    Blood pressure (!) 135/54, pulse 82, temperature 98 F (36.7 C), temperature source Oral, resp. rate 18, SpO2 95 %.There is no height or weight on file to calculate BMI.  General Appearance: Casual  Eye Contact:  Good  Speech:  Clear and Coherent and Normal Rate  Volume:  Normal  Mood:  Anxious  Affect:  Congruent  Thought Process:  Coherent, Goal Directed and Descriptions of Associations: Intact  Orientation:  Full (Time, Place, and Person)  Thought Content:  Logical  Suicidal Thoughts:  No  Homicidal Thoughts:  No  Memory:  Immediate;   Good Recent;   Good Remote;   Good  Judgement:  Fair  Insight:  Fair  Psychomotor Activity:  Normal  Concentration:  Concentration: Good and Attention Span: Good  Recall:  Good  Fund of Knowledge:  Good  Language:  Good  Akathisia:  No  Handed:  Right  AIMS (if indicated):     Assets:  Communication Skills Desire for Improvement Financial Resources/Insurance Housing Intimacy Leisure Time Physical Health Resilience Social Support Talents/Skills Transportation  ADL's:  Intact  Cognition:  WNL   Sleep:         Treatment Plan Summary: Case discussed with Dr. Mallie Darting. Daily contact with patient to assess and evaluate symptoms and progress in treatment  Peers support consult ordered. Inpatient psychiatric treatment recommended at this time.  Emmaline Kluver, FNP 09/06/2019, 1:10 PM

## 2019-09-07 DIAGNOSIS — F332 Major depressive disorder, recurrent severe without psychotic features: Secondary | ICD-10-CM | POA: Diagnosis not present

## 2019-09-07 LAB — CBG MONITORING, ED
Glucose-Capillary: 222 mg/dL — ABNORMAL HIGH (ref 70–99)
Glucose-Capillary: 242 mg/dL — ABNORMAL HIGH (ref 70–99)
Glucose-Capillary: 94 mg/dL (ref 70–99)

## 2019-09-07 MED ORDER — LIDOCAINE 5 % EX PTCH
1.0000 | MEDICATED_PATCH | CUTANEOUS | Status: DC
  Administered 2019-09-07: 1 via TRANSDERMAL
  Filled 2019-09-07: qty 1

## 2019-09-07 NOTE — ED Notes (Signed)
Pt ambulatory on unit, from room to bathroom and back.  Steady gait noted.

## 2019-09-07 NOTE — ED Notes (Signed)
This RN spoke with Weston Brass, [facility] regarding high dose of 70/30 insulin.  Pharmacist reviewed previous medical encounters of pt and reverified that 60 units 70/30 inuslin was proper dose.  Will administer as ordered.

## 2019-09-07 NOTE — ED Notes (Signed)
Pt DCd off unit to home per provider. Pt alert, cooperative, calm, no s/s of distress. DC information and resources given to and reviewed with pt, pt acknowledged understanding. Pt ambulatory off unit, escorted by [facility]. Pt given bus pass with directions. Pt valet to bus stop.

## 2019-09-07 NOTE — Consult Note (Signed)
[facility] Psych ED Discharge  09/07/2019 2:47 PM Luis [name]  MRN:  [id number] Principal Problem: Cocaine abuse [facility]) Discharge Diagnoses: Principal Problem:   Cocaine abuse (HCC) Active Problems:   Alcohol abuse   Subjective: Patient states "I finally got some sleep and I just woke up this morning." Patient denies suicidal and homicidal ideations.  Patient denies auditory and visual hallucinations.  Patient denies symptoms of paranoia.  Patient denies symptoms of alcohol withdrawal. Patient currently treated outpatient by [facility].  Patient plans to follow-up with [facility] as well as outpatient substance use resources upon discharge.  Total Time spent with patient: 20 minutes  Past Psychiatric History: Alcohol use disorder, cocaine use disorder, bipolar disorder Past Medical History:  Past Medical History:  Diagnosis Date  . Anxiety   . Biventricular ICD (implantable cardioverter-defibrillator) in place   . Chronic systolic CHF (congestive heart failure) (HCC)   . Cocaine use   . Diabetes mellitus without complication (HCC)   . GERD (gastroesophageal reflux disease)   . HLD (hyperlipidemia)   . Hypertension   . Morbid obesity (HCC)   . NICM (nonischemic cardiomyopathy) (HCC)   . OSA (obstructive sleep apnea)   . PTSD (post-traumatic stress disorder)    on Depakote  . Refusal of blood transfusions as patient is Jehovah's Witness   . Tobacco abuse     Past Surgical History:  Procedure Laterality Date  . BIV ICD INSERTION CRT-D    . FOOT FRACTURE SURGERY    . ROTATOR CUFF REPAIR    . TONSILLECTOMY     Family History:  Family History  Problem Relation Age of Onset  . Hypertension Mother   . Bone cancer Father   . Lung cancer Father    Family Psychiatric  History: Denies Social History:  Social History   Substance and Sexual Activity  Alcohol Use Yes     Social History   Substance and Sexual Activity  Drug Use Yes  . Types: Cocaine    Social History    Socioeconomic History  . Marital status: Legally Separated    Spouse name: Not on file  . Number of children: Not on file  . Years of education: Not on file  . Highest education level: Not on file  Occupational History  . Not on file  Tobacco Use  . Smoking status: Current Every Day Smoker    Types: Cigarettes  . Smokeless tobacco: Never Used  Substance and Sexual Activity  . Alcohol use: Yes  . Drug use: Yes    Types: Cocaine  . Sexual activity: Not on file  Other Topics Concern  . Not on file  Social History Narrative  . Not on file   Social Determinants of Health   Financial Resource Strain:   . Difficulty of Paying Living Expenses:   Food Insecurity:   . Worried About Programme researcher, broadcasting/film/video in the Last Year:   . Barista in the Last Year:   Transportation Needs:   . Freight forwarder (Medical):   Marland Kitchen Lack of Transportation (Non-Medical):   Physical Activity:   . Days of Exercise per Week:   . Minutes of Exercise per Session:   Stress:   . Feeling of Stress :   Social Connections:   . Frequency of Communication with Friends and Family:   . Frequency of Social Gatherings with Friends and Family:   . Attends Religious Services:   . Active Member of Clubs or Organizations:   .  Attends Banker Meetings:   Marland Kitchen Marital Status:     Has this patient used any form of tobacco in the last 30 days? (Cigarettes, Smokeless Tobacco, Cigars, and/or Pipes) A prescription for an FDA-approved tobacco cessation medication was offered at discharge and the patient refused  Current Medications: Current Facility-Administered Medications  Medication Dose Route Frequency Provider Last Rate Last Admin  . acetaminophen (TYLENOL) tablet 650 mg  650 mg Oral Q6H PRN Zadie Rhine, MD   650 mg at 09/06/19 0206  . clopidogrel (PLAVIX) tablet 75 mg  75 mg Oral Daily Zadie Rhine, MD   75 mg at 09/07/19 0932  . divalproex (DEPAKOTE) DR tablet 500 mg  500 mg Oral Q12H  Zadie Rhine, MD   500 mg at 09/07/19 0931  . folic acid (FOLVITE) tablet 1 mg  1 mg Oral Daily Zadie Rhine, MD   1 mg at 09/07/19 0932  . insulin aspart protamine- aspart (NOVOLOG MIX 70/30) injection 60 Units  60 Units Subcutaneous TID AC Wynetta Fines, MD   60 Units at 09/07/19 1211  . isosorbide mononitrate (IMDUR) 24 hr tablet 30 mg  30 mg Oral Daily Zadie Rhine, MD   30 mg at 09/07/19 0932  . lidocaine (LIDODERM) 5 % 1 patch  1 patch Transdermal Q24H Palumbo, April, MD   1 patch at 09/07/19 0940  . liraglutide (VICTOZA) SOPN 1.8 mg  1.8 mg Subcutaneous QAC breakfast Wynetta Fines, MD      . LORazepam (ATIVAN) injection 0-4 mg  0-4 mg Intravenous Q6H Zadie Rhine, MD   Stopped at 09/06/19 1659   Or  . LORazepam (ATIVAN) tablet 0-4 mg  0-4 mg Oral Q6H Zadie Rhine, MD   1 mg at 09/06/19 1503  . [START ON 09/08/2019] LORazepam (ATIVAN) injection 0-4 mg  0-4 mg Intravenous Q12H Zadie Rhine, MD       Or  . Melene Muller ON 09/08/2019] LORazepam (ATIVAN) tablet 0-4 mg  0-4 mg Oral Q12H Zadie Rhine, MD      . nicotine (NICODERM CQ - dosed in mg/24 hours) patch 21 mg  21 mg Transdermal Daily Zadie Rhine, MD      . ondansetron [facility]) tablet 4 mg  4 mg Oral Q8H PRN Zadie Rhine, MD      . pantoprazole (PROTONIX) EC tablet 40 mg  40 mg Oral BID Zadie Rhine, MD   40 mg at 09/07/19 0932  . prazosin (MINIPRESS) capsule 2 mg  2 mg Oral QHS Zadie Rhine, MD   2 mg at 09/06/19 2131  . rosuvastatin (CRESTOR) tablet 20 mg  20 mg Oral Daily Zadie Rhine, MD   20 mg at 09/07/19 0932  . sertraline (ZOLOFT) tablet 50 mg  50 mg Oral TID Zadie Rhine, MD   50 mg at 09/07/19 0932  . thiamine tablet 100 mg  100 mg Oral Daily Zadie Rhine, MD   100 mg at 09/07/19 0932   Or  . thiamine (B-1) injection 100 mg  100 mg Intravenous Daily Zadie Rhine, MD      . traZODone (DESYREL) tablet 200 mg  200 mg Oral QHS Zadie Rhine, MD   200 mg at 09/06/19 2131    Current Outpatient Medications  Medication Sig Dispense Refill  . clopidogrel (PLAVIX) 75 MG tablet Take 75 mg by mouth daily.    . divalproex (DEPAKOTE) 500 MG DR tablet Take 500 mg by mouth 2 (two) times a day.    . furosemide (LASIX) 80 MG tablet  Take 80 mg by mouth daily.    Marland Kitchen HUMALOG MIX 75/25 KWIKPEN (75-25) 100 UNIT/ML Kwikpen Inject 60 Units into the skin 3 (three) times daily before meals.     . isosorbide mononitrate (IMDUR) 30 MG 24 hr tablet Take 1 tablet (30 mg total) by mouth daily. 30 tablet 0  . metoprolol succinate (TOPROL-XL) 100 MG 24 hr tablet Take 100 mg by mouth daily. Take with or immediately following a meal.    . pantoprazole (PROTONIX) 40 MG tablet Take 1 tablet (40 mg total) by mouth 2 (two) times daily. 60 tablet 0  . prazosin (MINIPRESS) 2 MG capsule Take 2 mg by mouth at bedtime.    . rosuvastatin (CRESTOR) 20 MG tablet Take 20 mg by mouth daily.    . sertraline (ZOLOFT) 50 MG tablet Take 150 mg by mouth daily.     . traZODone (DESYREL) 100 MG tablet Take 200 mg by mouth at bedtime.     Marland Kitchen VICTOZA 18 MG/3ML SOPN Inject 1.8 mg into the skin daily before breakfast.      PTA Medications: (Not in a hospital admission)   Musculoskeletal: Strength & Muscle Tone: within normal limits Gait & Station: normal Patient leans: N/A  Psychiatric Specialty Exam: Physical Exam Vitals and nursing note reviewed.  Constitutional:      Appearance: He is well-developed.  HENT:     Head: Normocephalic.  Cardiovascular:     Rate and Rhythm: Normal rate.  Pulmonary:     Effort: Pulmonary effort is normal.  Neurological:     Mental Status: He is alert and oriented to person, place, and time.  Psychiatric:        Attention and Perception: Attention normal.        Mood and Affect: Mood is depressed.        Speech: Speech normal.        Behavior: Behavior normal.        Thought Content: Thought content normal.        Cognition and Memory: Cognition normal.         Judgment: Judgment normal.     Review of Systems  Constitutional: Negative.   HENT: Negative.   Eyes: Negative.   Respiratory: Negative.   Cardiovascular: Negative.   Gastrointestinal: Negative.   Genitourinary: Negative.   Musculoskeletal: Negative.   Skin: Negative.   Neurological: Negative.     Blood pressure (!) 103/52, pulse 89, temperature 97.8 F (36.6 C), temperature source Oral, resp. rate 16, SpO2 92 %.There is no height or weight on file to calculate BMI.  General Appearance: Casual and Fairly Groomed  Eye Contact:  Good  Speech:  Clear and Coherent and Normal Rate  Volume:  Normal  Mood:  Depressed  Affect:  Appropriate and Congruent  Thought Process:  Coherent, Goal Directed and Descriptions of Associations: Intact  Orientation:  Full (Time, Place, and Person)  Thought Content:  WDL and Logical  Suicidal Thoughts:  No  Homicidal Thoughts:  No  Memory:  Immediate;   Good Recent;   Good Remote;   Good  Judgement:  Good  Insight:  Fair  Psychomotor Activity:  Normal  Concentration:  Concentration: Good and Attention Span: Good  Recall:  Good  Fund of Knowledge:  Good  Language:  Good  Akathisia:  No  Handed:  Right  AIMS (if indicated):     Assets:  Communication Skills Desire for Improvement Financial Resources/Insurance Housing Intimacy Leisure Time Physical Health Resilience Social Support  ADL's:  Intact  Cognition:  WNL  Sleep:        Demographic Factors:  Male  Loss Factors: NA  Historical Factors: NA  Risk Reduction Factors:   Positive social support, Positive therapeutic relationship and Positive coping skills or problem solving skills  Continued Clinical Symptoms:  Alcohol/Substance Abuse/Dependencies  Cognitive Features That Contribute To Risk:  None    Suicide Risk:  Minimal: No identifiable suicidal ideation.  Patients presenting with no risk factors but with morbid ruminations; may be classified as minimal risk based on  the severity of the depressive symptoms    Plan Of Care/Follow-up recommendations:  Other:  Follow-up with outpatient [facility] and intake for [facility].  Case discussed with Dr. Jola Babinski.  Disposition: Discharge Patrcia Dolly, FNP 09/07/2019, 2:47 PM

## 2019-09-07 NOTE — Discharge Instructions (Signed)
For your behavioral health needs, you are advised to follow up with an outpatient provider.  You may be eligible for ACT Team services, and a preliminary referral has been made on your behalf to the [facility] ACT Team.  Contact them at your earliest opportunity to complete the process:       [facility]      [address]      [place], [place] [address]      [phone]

## 2019-09-07 NOTE — Progress Notes (Signed)
PT Cancellation Note  Patient Details Name: Luis [name] MRN: [id number] DOB: [birth year]   Cancelled Treatment:    Reason Eval/Treat Not Completed: PT screened, no needs identified, will sign off, spoke with RN who reports patient is ambulatory. PT will sign off. Please reconsult if there are skilled PT needs. Blanchard Kelch PT Acute Rehabilitation Services Pager [phone] Office [phone]    Rada Hay 09/07/2019, 8:36 AM

## 2019-09-07 NOTE — Patient Outreach (Signed)
CPSS spoke with Pt an made him aware that he was denied at [facility] because of health issues. However Pt will be getting involved with ACTT services; [facility]. CPSS contacted ACTT team with Pt consent an faxed information to start a PCP as soon as he is discharged from ER.

## 2019-09-07 NOTE — Progress Notes (Addendum)
Consult request has been received. CSW attempting to follow up at present time.  CSW received a call from pt's NT stating pt is refusing a bus pass due to:  1.  Having never rode a bus. 2. Not being from [facility]  Per pt's NT and RN pt is ambulatory, can walk just fine, A&OX4, and is psychiatrically cleared.  CSW printed out the bus route from the bus in front of the [employer] to downtown with specific written instructions how how to transfer from the bus he gets on (Route 7) to downtown and then transfer to the bus (Route 3) that drives directly by his street which is Building services [employer].  Pt is to, per written instructions and an additional map of route 3 provided by the CSW, get off on on bus stop #5, walk back to [employer] where his apartment is at [employer].  Pt has the option of exiting the bus on a stop previous to [facility].  CSW went over the instructions in detail with the pt, the pt focused his attention on the instructions, the written instructions and view the maps that were printed out and then went back to eating his meal.  CSW offered to come back if pt needed further clarification.  Pt then stated he can't take a bus because he has congestive heart failure.  CSW stated he would allow the EDP to make that decision as pt is ambulatory and would notify the pt's RN if the EDP decided a bus is appropriate, and CSW would alert admins if not and then CSW would update pt's the RN.  Pt voiced understanding, nodded and resumed eating his meal.  CSW consulted EDP who stated pt can take the bus.  CSW updated RN and updated RN that per security the RN can notify the valet service and have pt transported directly to the pt's bus stop and that per security if the valet is not available then security can assist once they have time.  Please reconsult if future social work needs arise.  CSW signing off, as social work intervention is no longer needed.  Dorothe Pea. Nikolaos Maddocks, LCSW,  LCAS, CSI Transitions of Care Clinical Social Worker Care Coordination Department Ph: [phone]

## 2019-09-07 NOTE — BH Assessment (Signed)
[facility] Assessment Progress Note  Per Berneice Heinrich, FNP, this pt does not require psychiatric hospitalization at this time.  Pt is to be discharged from [facility].  Arlys John Degraphenreid, CPPS, has spoken with the pt and has referred him to [facility] for ACT Team services.  Discharge instructions advise pt to follow up with them to complete the admission process.  Pt's nurse, Waynetta Sandy, has been notified.  Doylene Canning, MA Triage Specialist [phone]

## 2020-06-25 ENCOUNTER — Other Ambulatory Visit: Payer: Self-pay

## 2020-06-25 ENCOUNTER — Emergency Department (HOSPITAL_COMMUNITY): Payer: Medicare Other

## 2020-06-25 ENCOUNTER — Encounter (HOSPITAL_COMMUNITY): Payer: Self-pay | Admitting: Emergency Medicine

## 2020-06-25 ENCOUNTER — Emergency Department (HOSPITAL_COMMUNITY)
Admission: EM | Admit: 2020-06-25 | Discharge: 2020-06-26 | Disposition: A | Discharge status: Home / Self Care | Payer: Medicare Other | Attending: Emergency Medicine | Admitting: Emergency Medicine

## 2020-06-25 DIAGNOSIS — R059 Cough, unspecified: Secondary | ICD-10-CM | POA: Diagnosis present

## 2020-06-25 DIAGNOSIS — R197 Diarrhea, unspecified: Secondary | ICD-10-CM

## 2020-06-25 DIAGNOSIS — Z794 Long term (current) use of insulin: Secondary | ICD-10-CM | POA: Diagnosis not present

## 2020-06-25 DIAGNOSIS — Z95810 Presence of automatic (implantable) cardiac defibrillator: Secondary | ICD-10-CM | POA: Insufficient documentation

## 2020-06-25 DIAGNOSIS — I110 Hypertensive heart disease with heart failure: Secondary | ICD-10-CM | POA: Insufficient documentation

## 2020-06-25 DIAGNOSIS — Z91040 Latex allergy status: Secondary | ICD-10-CM | POA: Insufficient documentation

## 2020-06-25 DIAGNOSIS — E119 Type 2 diabetes mellitus without complications: Secondary | ICD-10-CM | POA: Diagnosis not present

## 2020-06-25 DIAGNOSIS — I5022 Chronic systolic (congestive) heart failure: Secondary | ICD-10-CM | POA: Insufficient documentation

## 2020-06-25 DIAGNOSIS — R112 Nausea with vomiting, unspecified: Secondary | ICD-10-CM

## 2020-06-25 DIAGNOSIS — F17210 Nicotine dependence, cigarettes, uncomplicated: Secondary | ICD-10-CM | POA: Diagnosis not present

## 2020-06-25 DIAGNOSIS — U071 COVID-19: Secondary | ICD-10-CM | POA: Diagnosis not present

## 2020-06-25 DIAGNOSIS — Z79899 Other long term (current) drug therapy: Secondary | ICD-10-CM | POA: Diagnosis not present

## 2020-06-25 DIAGNOSIS — R0789 Other chest pain: Secondary | ICD-10-CM

## 2020-06-25 LAB — BASIC METABOLIC PANEL
Anion gap: 9 (ref 5–15)
BUN: 9 mg/dL (ref 6–20)
CO2: 25 mmol/L (ref 22–32)
Calcium: 8.4 mg/dL — ABNORMAL LOW (ref 8.9–10.3)
Chloride: 102 mmol/L (ref 98–111)
Creatinine, Ser: 0.86 mg/dL (ref 0.61–1.24)
GFR, Estimated: >60 mL/min (ref >60)
Glucose, Bld: 254 mg/dL — ABNORMAL HIGH (ref 70–99)
Potassium: 4.5 mmol/L (ref 3.5–5.1)
Sodium: 136 mmol/L (ref 135–145)

## 2020-06-25 LAB — CBC
HCT: 45.3 % (ref 39.0–52.0)
Hemoglobin: 14.5 g/dL (ref 13.0–17.0)
MCH: 29 pg (ref 26.0–34.0)
MCHC: 32 g/dL (ref 30.0–36.0)
MCV: 90.6 fL (ref 80.0–100.0)
Platelets: 201 10*3/uL (ref 150–400)
RBC: 5 MIL/uL (ref 4.22–5.81)
RDW: 13 % (ref 11.5–15.5)
WBC: 6.7 10*3/uL (ref 4.0–10.5)
nRBC: 0 % (ref 0.0–0.2)

## 2020-06-25 LAB — TROPONIN I (HIGH SENSITIVITY): Troponin I (High Sensitivity): 13 ng/L (ref <18)

## 2020-06-25 NOTE — ED Notes (Signed)
Spoke with physician on call at patients primary care office in [facility], Physician called in to inquire about the patient being told that he would not be evaluated until 6am. Patient is still being triaged and has not been here more than 10 mins. Asked physician to call back in about 30 mins to speak to someone.

## 2020-06-25 NOTE — ED Triage Notes (Signed)
Pt BIB [facility] from home with multiple complaints. Pt c/o cough, sneezing, fever, chest pressure, and loss of smell/taste x 3 days. Recent contact with covid+ person.

## 2020-06-26 DIAGNOSIS — U071 COVID-19: Secondary | ICD-10-CM | POA: Diagnosis not present

## 2020-06-26 LAB — RESP PANEL BY RT-PCR (FLU A&B, COVID) ARPGX2
Influenza A by PCR: NEGATIVE
Influenza B by PCR: NEGATIVE
SARS Coronavirus 2 by RT PCR: POSITIVE — AB

## 2020-06-26 LAB — TROPONIN I (HIGH SENSITIVITY): Troponin I (High Sensitivity): 14 ng/L (ref <18)

## 2020-06-26 LAB — CBG MONITORING, ED: Glucose-Capillary: 322 mg/dL — ABNORMAL HIGH (ref 70–99)

## 2020-06-26 MED ORDER — EPINEPHRINE 0.3 MG/0.3ML IJ SOAJ: 0.3000 mg | Freq: Once | INTRAMUSCULAR | Status: DC | PRN

## 2020-06-26 MED ORDER — ONDANSETRON 4 MG PO TBDP: 4.0000 mg | ORAL_TABLET | Freq: Three times a day (TID) | ORAL | 0 refills | Status: DC | PRN

## 2020-06-26 MED ORDER — MECLIZINE HCL 25 MG PO TABS
50.0000 mg | ORAL_TABLET | Freq: Once | ORAL | Status: AC
  Administered 2020-06-26: 50 mg via ORAL
  Filled 2020-06-26: qty 2

## 2020-06-26 MED ORDER — ONDANSETRON HCL 4 MG/2ML IJ SOLN
4.0000 mg | Freq: Once | INTRAMUSCULAR | Status: AC
  Administered 2020-06-26: 4 mg via INTRAVENOUS
  Filled 2020-06-26: qty 2

## 2020-06-26 MED ORDER — SODIUM CHLORIDE 0.9 % IV SOLN: INTRAVENOUS | Status: DC | PRN

## 2020-06-26 MED ORDER — ALBUTEROL SULFATE HFA 108 (90 BASE) MCG/ACT IN AERS: 2.0000 | INHALATION_SPRAY | Freq: Once | RESPIRATORY_TRACT | Status: DC | PRN

## 2020-06-26 MED ORDER — FAMOTIDINE 20 MG IN NS 100 ML IVPB: 20.0000 mg | Freq: Once | INTRAVENOUS | Status: DC | PRN

## 2020-06-26 MED ORDER — METHYLPREDNISOLONE SODIUM SUCC 125 MG IJ SOLR: 125.0000 mg | Freq: Once | INTRAMUSCULAR | Status: DC | PRN

## 2020-06-26 MED ORDER — ALBUTEROL SULFATE HFA 108 (90 BASE) MCG/ACT IN AERS
2.0000 | INHALATION_SPRAY | Freq: Once | RESPIRATORY_TRACT | Status: AC
  Administered 2020-06-26: 2 via RESPIRATORY_TRACT
  Filled 2020-06-26: qty 6.7

## 2020-06-26 MED ORDER — MECLIZINE HCL 25 MG PO TABS: 25.0000 mg | ORAL_TABLET | Freq: Three times a day (TID) | ORAL | 0 refills | Status: DC | PRN

## 2020-06-26 MED ORDER — LACTATED RINGERS IV BOLUS
1000.0000 mL | Freq: Once | INTRAVENOUS | Status: AC
  Administered 2020-06-26: 1000 mL via INTRAVENOUS

## 2020-06-26 MED ORDER — SODIUM CHLORIDE 0.9 % IV SOLN
Freq: Once | INTRAVENOUS | Status: AC
  Filled 2020-06-26: qty 5

## 2020-06-26 MED ORDER — DIPHENHYDRAMINE HCL 50 MG/ML IJ SOLN: 50.0000 mg | Freq: Once | INTRAMUSCULAR | Status: DC | PRN

## 2020-06-26 MED ORDER — ACETAMINOPHEN 325 MG PO TABS
650.0000 mg | ORAL_TABLET | Freq: Once | ORAL | Status: AC
  Administered 2020-06-26: 650 mg via ORAL
  Filled 2020-06-26: qty 2

## 2020-06-26 NOTE — Progress Notes (Signed)
Inpatient Diabetes Program Recommendations  AACE/ADA: New Consensus Statement on Inpatient Glycemic Control (2015)  Target Ranges:  Prepandial:   less than 140 mg/dL      Peak postprandial:   less than 180 mg/dL (1-2 hours)      Critically ill patients:  140 - 180 mg/dL   Lab Results  Component Value Date   GLUCAP 322 (H) 06/26/2020   HGBA1C 9.4 (H) 07/26/2019    Review of Glycemic Control Results for Luis [name], Luis [name] (MRN [id number]) as of 06/26/2020 11:19  Ref. Range 06/26/2020 06:51  Glucose-Capillary Latest Ref Range: 70 - 99 mg/dL 568 (H)   Diabetes history: Type 2 DM Outpatient Diabetes medications: Humalog 75/25 60 units TID, Victoza 1.8 mg QAM Current orders for Inpatient glycemic control: none Solumedrol 125 mg x1  Inpatient Diabetes Program Recommendations:    Anticipate glucose trends to increase with administration of steroids.   Consider: -A1C as last result from 06/2019 -Levemir 30 units BID -Novolog 0-20 units TID &Hs -Tradjenta 5 mg QD  Thanks, Lujean Rave, MSN, RNC-OB Diabetes Coordinator [phone] (8a-5p)

## 2020-06-26 NOTE — ED Provider Notes (Signed)
[facility] EMERGENCY DEPARTMENT Provider Note   CSN: [id number] Arrival date & time: 06/25/20  2017   History Chief Complaint  Patient presents with  . Cough    Luis [name] is a 49 y.o. male.  The history is provided by the patient.  Cough He has a complicated past history which includes hypertension, diabetes, hyperlipidemia, systolic heart failure, and comes in with multiple complaints.  He has been sick for the last 3 days with nonproductive cough, nausea, vomiting, diarrhea, body aches.  He also describes a heavy feeling across his chest.  He denies fever or chills.  He has lost his sense of smell and taste.  He denies any urinary symptoms.  He has been exposed to COVID-19 and he has been immunized against COVID-19.  He states that he has not been able to hold anything down for the last 3 days because of repeated episodes of emesis.  He is concerned that he is getting dehydrated.  Past Medical History:  Diagnosis Date  . Anxiety   . Biventricular ICD (implantable cardioverter-defibrillator) in place   . Chronic systolic CHF (congestive heart failure) (HCC)   . Cocaine use   . Diabetes mellitus without complication (HCC)   . GERD (gastroesophageal reflux disease)   . HLD (hyperlipidemia)   . Hypertension   . Morbid obesity (HCC)   . NICM (nonischemic cardiomyopathy) (HCC)   . OSA (obstructive sleep apnea)   . PTSD (post-traumatic stress disorder)    on Depakote  . Refusal of blood transfusions as patient is Jehovah's Witness   . Tobacco abuse     Patient Active Problem List   Diagnosis Date Noted  . Leukocytosis 07/26/2019  . Nonischemic cardiomyopathy (HCC)   . Acute respiratory failure with hypoxia (HCC) 05/05/2019  . Rhinovirus infection 05/05/2019  . Morbid obesity (HCC) 05/05/2019  . Suspected COVID-19 virus infection 05/05/2019  . Acute intractable headache   . Cocaine abuse (HCC)   . Acute non intractable tension-type headache    . Blurred vision, bilateral   . Chronic systolic CHF (congestive heart failure) (HCC) 01/17/2019  . Pacemaker   . HLD (hyperlipidemia)   . Diabetes mellitus without complication (HCC)   . Hypertension   . GERD (gastroesophageal reflux disease)   . Chest pain   . Tobacco abuse   . Alcohol abuse     Past Surgical History:  Procedure Laterality Date  . BIV ICD INSERTION CRT-D    . FOOT FRACTURE SURGERY    . ROTATOR CUFF REPAIR    . TONSILLECTOMY         Family History  Problem Relation Age of Onset  . Hypertension Mother   . Bone cancer Father   . Lung cancer Father     Social History   Tobacco Use  . Smoking status: Current Every Day Smoker    Types: Cigarettes  . Smokeless tobacco: Never Used  Vaping Use  . Vaping Use: Never used  Substance Use Topics  . Alcohol use: Yes  . Drug use: Yes    Types: Cocaine    Home Medications Prior to Admission medications   Medication Sig Start Date End Date Taking? Authorizing Provider  clopidogrel (PLAVIX) 75 MG tablet Take 75 mg by mouth daily. 01/13/19   [provider]  divalproex (DEPAKOTE) 500 MG DR tablet Take 500 mg by mouth 2 (two) times a day. 06/30/18   [provider]  furosemide (LASIX) 80 MG tablet Take 80 mg by  mouth daily. 09/13/18   [provider]  HUMALOG MIX 75/25 KWIKPEN (75-25) 100 UNIT/ML Kwikpen Inject 60 Units into the skin 3 (three) times daily before meals.  01/13/19   [provider]  isosorbide mononitrate (IMDUR) 30 MG 24 hr tablet Take 1 tablet (30 mg total) by mouth daily. 07/28/19   Black, Lesle Chris, NP  metoprolol succinate (TOPROL-XL) 100 MG 24 hr tablet Take 100 mg by mouth daily. Take with or immediately following a meal.    [provider]  pantoprazole (PROTONIX) 40 MG tablet Take 1 tablet (40 mg total) by mouth 2 (two) times daily. 07/27/19   Joseph Art, DO  prazosin (MINIPRESS) 2 MG capsule Take 2 mg by mouth at bedtime. 10/28/18   [provider]  rosuvastatin (CRESTOR) 20 MG tablet Take 20 mg by mouth daily. 09/21/18   [provider]  sertraline (ZOLOFT) 50 MG tablet Take 150 mg by mouth daily.  10/28/18   [provider]  traZODone (DESYREL) 100 MG tablet Take 200 mg by mouth at bedtime.  12/19/18   [provider]  VICTOZA 18 MG/3ML SOPN Inject 1.8 mg into the skin daily before breakfast.  01/09/19   [provider]    Allergies    Aspirin, Iodine-131, Iodinated diagnostic agents, Latex, Penicillins, Amoxicillin-pot clavulanate, and Lisinopril  Review of Systems   Review of Systems  Respiratory: Positive for cough.   All other systems reviewed and are negative.   Physical Exam Updated Vital Signs BP (!) 146/79 (BP Location: Right Arm)   Pulse 77   Temp 98.9 F (37.2 C) (Oral)   Resp 18   SpO2 94%   Physical Exam Vitals and nursing note reviewed.   49 year old male, resting comfortably and in no acute distress. Vital signs are significant for mildly elevated blood pressure. Oxygen saturation is 94%, which is normal. Head is normocephalic and atraumatic. PERRLA, EOMI. Oropharynx is clear. Neck is nontender and supple without adenopathy or JVD. Back is nontender and there is no CVA tenderness. Lungs are clear without rales, wheezes, or rhonchi. Chest is nontender. Heart has regular rate and rhythm without murmur. Abdomen is soft, flat, nontender without masses or hepatosplenomegaly and peristalsis is hypoactive. Extremities have no cyanosis or edema, full range of motion is present. Skin is warm and dry without rash. Neurologic: Mental status is normal, cranial nerves are intact, there are no motor or sensory deficits.  ED Results / Procedures / Treatments   Labs (all labs ordered are listed, but only abnormal results are displayed) Labs Reviewed  RESP PANEL BY RT-PCR (FLU A&B, COVID) ARPGX2 - Abnormal; Notable for the following components:      Result Value   SARS  Coronavirus 2 by RT PCR POSITIVE (*)    All other components within normal limits  BASIC METABOLIC PANEL - Abnormal; Notable for the following components:   Glucose, Bld 254 (*)    Calcium 8.4 (*)    All other components within normal limits  CBC  TROPONIN I (HIGH SENSITIVITY)  TROPONIN I (HIGH SENSITIVITY)    EKG EKG Interpretation  Date/Time:  Tuesday June 25 2020 21:47:25 EST Ventricular Rate:  77 PR Interval:  156 QRS Duration: 140 QT Interval:  430 QTC Calculation: 486 R Axis:   111 Text Interpretation: Atrial-sensed ventricular-paced rhythm Abnormal ECG When compared with ECG of 07/27/2019, No significant change was found Confirmed by Dione Booze ([id number]) on 06/26/2020 12:35:05 AM   Radiology DG Chest  Portable 1 View  Result Date: 06/25/2020 CLINICAL DATA:  Cough, pneumonia, current smoker EXAM: PORTABLE CHEST 1 VIEW COMPARISON:  07/26/2019 FINDINGS: Lungs are clear. No pneumothorax or pleural effusion. Cardiac size within normal limits. Left subclavian 3 lead pacemaker defibrillator is unchanged. Pulmonary vascularity is normal. No acute bone abnormality. IMPRESSION: No active disease. Electronically Signed   By: Helyn Numbers MD   On: 06/25/2020 22:13    Procedures Procedures   Medications Ordered in ED Medications  albuterol (VENTOLIN HFA) 108 (90 Base) MCG/ACT inhaler 2 puff (2 puffs Inhalation Given 06/26/20 0644)  ondansetron (ZOFRAN) injection 4 mg (4 mg Intravenous Given 06/26/20 0644)  lactated ringers bolus 1,000 mL (1,000 mLs Intravenous New Bag/Given 06/26/20 0643)  acetaminophen (TYLENOL) tablet 650 mg (650 mg Oral Given 06/26/20 8144)    ED Course  I have reviewed the triage vital signs and the nursing notes.  Pertinent labs & imaging results that were available during my care of the patient were reviewed by me and considered in my medical decision making (see chart for details).  MDM Rules/Calculators/A&P Influenza-like illness.  In the setting  of COVID-19 pandemic, certainly have concern for COVID-19.  Chest discomfort is likely part of his viral syndrome, but description is somewhat worrisome for ACS other than the fact that pain has been there constantly for an extended period of time.  ECG shows no acute changes and troponin is normal x2.  This effectively rules out ACS.  Heart risk score is 4 which does put him at elevated risk for major adverse cardiac events, but this can be evaluated as an outpatient.  Respiratory pathogen PCR test is positive for COVID-19, negative for influenza a and B.  CBC and metabolic panel are unremarkable.  Chest x-ray shows no acute process.  He is maintaining adequate oxygen saturations on room air.  We will try albuterol with ipratropium to see if he gets symptomatic relief from this.  Old records are reviewed, and he does have history hospitalization for respiratory failure secondary to heart failure.  I feel that he is likely dehydrated based on vomiting and diarrhea, will give IV fluids and ondansetron.  Case is signed out to Dr. Dalene Seltzer.  Luis [name] was evaluated in Emergency Department on 06/26/2020 for the symptoms described in the history of present illness. He was evaluated in the context of the global COVID-19 pandemic, which necessitated consideration that the patient might be at risk for infection with the SARS-CoV-2 virus that causes COVID-19. Institutional protocols and algorithms that pertain to the evaluation of patients at risk for COVID-19 are in a state of rapid change based on information released by regulatory bodies including the CDC and federal and state organizations. These policies and algorithms were followed during the patient's care in the ED.   Final Clinical Impression(s) / ED Diagnoses Final diagnoses:  COVID-19 virus infection  Chest discomfort  Nausea, vomiting and diarrhea    Rx / DC Orders ED Discharge Orders    None       Dione Booze, MD 06/26/20  [phone]

## 2020-06-26 NOTE — ED Notes (Addendum)
Ambulated Pt in RM due to COVID. Pt started at 96%. Asked Pt to make 10 Laps in RM. Pt was only able to do 8 laps b/c he stated he was too dizzy. Pt stayed between 93%-96% while ambulating. Pt's BP did not drop significantly when complaining of dizziness.

## 2020-06-26 NOTE — Discharge Planning (Signed)
RNCM consulted regarding transportation needs for pt.  RNCM provided [facility] Transportation as pt has no transportation from hospital.  RNCM presented and explained [facility] Therapist, nutritional and Release of Liability Form.  Pt gave RNCM permission to sign waiver, therefore agreeing to written terms.

## 2020-06-26 NOTE — ED Provider Notes (Signed)
  Physical Exam  BP 135/65   Pulse 74   Temp 97.9 F (36.6 C)   Resp (!) 21   SpO2 92%   Physical Exam  ED Course/Procedures     Procedures  MDM  Received care of patient from Dr. Preston Fleeting. Briefly, this is a 49yo male with history of CHF, hyperlipidemia, DM, htn, OSA, obesity who presents with cough, anusea, vomiting, body aches, chest heaviness and dizziness found to have COVID 19. Was immunized against covid but has yet received booster.  Patient reports to me he is on 1.5-2L of O2 at home that was prescribed long ago due to his CHF and has an oxygen concentrator at home and has CPAP to use at night. He was observed for many hours in the ED without desaturation, also noted to be frequently removing his home O2. Ambulated in the ED without desaturation and with sats 93-96%. Describes dizziness present with position changes and without focal neurologic concerns and is able to ambulate independently without signs of ataxia.  He is able to tolerate po without emesis.  Do not see indication for admission although did discuss he is high risk.  Given risk factors and on day 7 of illness, given monoclonal antibody infusion.   Consulted hospital at home program and he was determined to not be a candidate, however consulted CM and working on home health agency evaluation.  Discussed strict return precautions. Patient discharged in stable condition with understanding of reasons to return.       Alvira Monday, MD 06/26/20 2228

## 2020-06-26 NOTE — Discharge Planning (Signed)
RNCM consulted regarding pt home oxygen supplier.  EDRN asked pt if he remembered the name of the company, pt did not stating he "rarely wears it."  RNCM checked with area oxygen supply companies ([employer], [employer] and [employer]) and all deny supplying oxygen for this pt.  [employer] supplies CPAP machine and supplies, however.

## 2020-07-03 NOTE — Progress Notes (Deleted)
Cardiology Office Note:   Date:  07/03/2020  NAME:  Luis [name]    MRN: [id number] DOB:  1971/04/11   PCP:  Margot Ables, MD  Cardiologist:  Bryan Lemma, MD  Electrophysiologist:  None   Referring MD: Margot Ables*   No chief complaint on file. ***  History of Present Illness:   Luis [name] is a 49 y.o. male with a hx of systolic CHF (non-ischemic), DM, HTN, cocaine use, OSA who is being seen today for the evaluation of CHF at the request of Margot Ables, MD.  Problem List 1. Systolic CHF, EF 41-32% -s/p BiV ICD -normal CCTA 01/19/2019 2. Cocaine use -binge 07/26/2019 -> CP 3. DM 4. HTN 5. Morbid obesity  Past Medical History: Past Medical History:  Diagnosis Date  . Anxiety   . Biventricular ICD (implantable cardioverter-defibrillator) in place   . Chronic systolic CHF (congestive heart failure) (HCC)   . Cocaine use   . Diabetes mellitus without complication (HCC)   . GERD (gastroesophageal reflux disease)   . HLD (hyperlipidemia)   . Hypertension   . Morbid obesity (HCC)   . NICM (nonischemic cardiomyopathy) (HCC)   . OSA (obstructive sleep apnea)   . PTSD (post-traumatic stress disorder)    on Depakote  . Refusal of blood transfusions as patient is Jehovah's Witness   . Tobacco abuse     Past Surgical History: Past Surgical History:  Procedure Laterality Date  . BIV ICD INSERTION CRT-D    . FOOT FRACTURE SURGERY    . ROTATOR CUFF REPAIR    . TONSILLECTOMY      Current Medications: No outpatient medications have been marked as taking for the 07/04/20 encounter (Appointment) with Sande Rives, MD.     Allergies:    Aspirin, Iodine-131, Iodinated diagnostic agents, Latex, Penicillins, Amoxicillin-pot clavulanate, and Lisinopril   Social History: Social History   Socioeconomic History  . Marital status: Legally Separated    Spouse name: Not on file  . Number of children: Not on file  .  Years of education: Not on file  . Highest education level: Not on file  Occupational History  . Not on file  Tobacco Use  . Smoking status: Current Every Day Smoker    Types: Cigarettes  . Smokeless tobacco: Never Used  Vaping Use  . Vaping Use: Never used  Substance and Sexual Activity  . Alcohol use: Yes  . Drug use: Yes    Types: Cocaine  . Sexual activity: Not on file  Other Topics Concern  . Not on file  Social History Narrative  . Not on file   Social Determinants of Health   Financial Resource Strain: Not on file  Food Insecurity: Not on file  Transportation Needs: Not on file  Physical Activity: Not on file  Stress: Not on file  Social Connections: Not on file     Family History: The patient's ***family history includes Bone cancer in his father; Hypertension in his mother; Lung cancer in his father.  ROS:   All other ROS reviewed and negative. Pertinent positives noted in the HPI.     EKGs/Labs/Other Studies Reviewed:   The following studies were personally reviewed by me today:  EKG:  EKG is *** ordered today.  The ekg ordered today demonstrates ***, and was personally reviewed by me.   TTE 07/26/2019  1. Left ventricular ejection fraction, by visual estimation, is 35 to  40%. The left ventricle has moderate to severely decreased function.  There  is moderately increased left ventricular hypertrophy.  2. Left ventricular diastolic parameters are consistent with Grade II  diastolic dysfunction (pseudonormalization).  3. Mild to moderately dilated left ventricular internal cavity size.  4. The left ventricle demonstrates global hypokinesis.  5. Global right ventricle has normal systolic function.The right  ventricular size is normal. No increase in right ventricular wall  thickness.  6. Left atrial size was normal.  7. Right atrial size was normal.  8. Trivial pericardial effusion is present.  9. The mitral valve is normal in structure. Trivial  mitral valve  regurgitation.  10. The tricuspid valve is normal in structure.  11. The tricuspid valve is normal in structure. Tricuspid valve  regurgitation is trivial.  12. The aortic valve is tricuspid. Aortic valve regurgitation is not  visualized. No evidence of aortic valve sclerosis or stenosis.  13. The pulmonic valve was grossly normal. Pulmonic valve regurgitation is  not visualized.  14. Mildly elevated pulmonary artery systolic pressure.  15. The inferior vena cava is normal in size with greater than 50%  respiratory variability, suggesting right atrial pressure of 3 mmHg.  16. No prior Echocardiogram.   Recent Labs: 07/26/2019: Magnesium 2.1 09/05/2019: ALT 19 06/25/2020: BUN 9; Creatinine, Ser 0.86; Hemoglobin 14.5; Platelets 201; Potassium 4.5; Sodium 136   Recent Lipid Panel    Component Value Date/Time   CHOL 114 07/26/2019 0805   TRIG 244 (H) 07/26/2019 0805   HDL 34 (L) 07/26/2019 0805   CHOLHDL 3.4 07/26/2019 0805   VLDL 49 (H) 07/26/2019 0805   LDLCALC 31 07/26/2019 0805    Physical Exam:   VS:  There were no vitals taken for this visit.   Wt Readings from Last 3 Encounters:  07/27/19 296 lb 14.4 oz (134.7 kg)  05/04/19 (!) 309 lb (140.2 kg)  03/25/19 (!) 320 lb (145.2 kg)    General: Well nourished, well developed, in no acute distress Head: Atraumatic, normal size  Eyes: PEERLA, EOMI  Neck: Supple, no JVD Endocrine: No thryomegaly Cardiac: Normal S1, S2; RRR; no murmurs, rubs, or gallops Lungs: Clear to auscultation bilaterally, no wheezing, rhonchi or rales  Abd: Soft, nontender, no hepatomegaly  Ext: No edema, pulses 2+ Musculoskeletal: No deformities, BUE and BLE strength normal and equal Skin: Warm and dry, no rashes   Neuro: Alert and oriented to person, place, time, and situation, CNII-XII grossly intact, no focal deficits  Psych: Normal mood and affect   ASSESSMENT:   Luis [name] is a 50 y.o. male who presents for the  following: No diagnosis found.  PLAN:   There are no diagnoses linked to this encounter.  Disposition: No follow-ups on file.  Medication Adjustments/Labs and Tests Ordered: Current medicines are reviewed at length with the patient today.  Concerns regarding medicines are outlined above.  No orders of the defined types were placed in this encounter.  No orders of the defined types were placed in this encounter.   There are no Patient Instructions on file for this visit.   Time Spent with Patient: I have spent a total of *** minutes with patient reviewing hospital notes, telemetry, EKGs, labs and examining the patient as well as establishing an assessment and plan that was discussed with the patient.  > 50% of time was spent in direct patient care.  Signed, Lenna Gilford. Flora Lipps, MD [facility]  [address], [address] [place], [place] [address] [phone]  07/03/2020 4:31 PM

## 2020-07-04 ENCOUNTER — Ambulatory Visit: Payer: Medicare Other | Admitting: Cardiovascular Disease

## 2020-07-04 DIAGNOSIS — I10 Essential (primary) hypertension: Secondary | ICD-10-CM

## 2020-07-04 DIAGNOSIS — E782 Mixed hyperlipidemia: Secondary | ICD-10-CM

## 2020-07-04 DIAGNOSIS — I5022 Chronic systolic (congestive) heart failure: Secondary | ICD-10-CM

## 2020-07-15 ENCOUNTER — Ambulatory Visit: Payer: Medicare Other | Admitting: Cardiovascular Disease

## 2020-07-16 ENCOUNTER — Ambulatory Visit: Payer: Medicare Other | Admitting: Cardiovascular Disease

## 2020-07-27 ENCOUNTER — Encounter (HOSPITAL_COMMUNITY): Payer: Self-pay | Admitting: *Deleted

## 2020-07-27 ENCOUNTER — Emergency Department (HOSPITAL_COMMUNITY)
Admission: EM | Admit: 2020-07-27 | Discharge: 2020-07-27 | Disposition: A | Discharge status: Home / Self Care | Payer: Medicare Other | Attending: Emergency Medicine | Admitting: Emergency Medicine

## 2020-07-27 DIAGNOSIS — E119 Type 2 diabetes mellitus without complications: Secondary | ICD-10-CM | POA: Insufficient documentation

## 2020-07-27 DIAGNOSIS — F17210 Nicotine dependence, cigarettes, uncomplicated: Secondary | ICD-10-CM | POA: Insufficient documentation

## 2020-07-27 DIAGNOSIS — Z91040 Latex allergy status: Secondary | ICD-10-CM | POA: Insufficient documentation

## 2020-07-27 DIAGNOSIS — I110 Hypertensive heart disease with heart failure: Secondary | ICD-10-CM | POA: Diagnosis not present

## 2020-07-27 DIAGNOSIS — R350 Frequency of micturition: Secondary | ICD-10-CM

## 2020-07-27 DIAGNOSIS — I5022 Chronic systolic (congestive) heart failure: Secondary | ICD-10-CM | POA: Diagnosis not present

## 2020-07-27 DIAGNOSIS — N401 Enlarged prostate with lower urinary tract symptoms: Secondary | ICD-10-CM | POA: Diagnosis not present

## 2020-07-27 DIAGNOSIS — Z79899 Other long term (current) drug therapy: Secondary | ICD-10-CM | POA: Diagnosis not present

## 2020-07-27 LAB — COMPREHENSIVE METABOLIC PANEL
ALT: 13 U/L (ref 0–44)
AST: 18 U/L (ref 15–41)
Albumin: 3.4 g/dL — ABNORMAL LOW (ref 3.5–5.0)
Alkaline Phosphatase: 40 U/L (ref 38–126)
Anion gap: 8 (ref 5–15)
BUN: 6 mg/dL (ref 6–20)
CO2: 26 mmol/L (ref 22–32)
Calcium: 8.6 mg/dL — ABNORMAL LOW (ref 8.9–10.3)
Chloride: 105 mmol/L (ref 98–111)
Creatinine, Ser: 0.74 mg/dL (ref 0.61–1.24)
GFR, Estimated: >60 mL/min (ref >60)
Glucose, Bld: 165 mg/dL — ABNORMAL HIGH (ref 70–99)
Potassium: 3.9 mmol/L (ref 3.5–5.1)
Sodium: 139 mmol/L (ref 135–145)
Total Bilirubin: 0.6 mg/dL (ref 0.3–1.2)
Total Protein: 5.9 g/dL — ABNORMAL LOW (ref 6.5–8.1)

## 2020-07-27 LAB — URINALYSIS, ROUTINE W REFLEX MICROSCOPIC
Bacteria, UA: NONE SEEN
Bilirubin Urine: NEGATIVE
Glucose, UA: NEGATIVE mg/dL
Hgb urine dipstick: NEGATIVE
Ketones, ur: NEGATIVE mg/dL
Leukocytes,Ua: NEGATIVE
Nitrite: NEGATIVE
Protein, ur: 30 mg/dL — AB
Specific Gravity, Urine: 1.024 (ref 1.005–1.030)
pH: 6 (ref 5.0–8.0)

## 2020-07-27 LAB — VALPROIC ACID LEVEL: Valproic Acid Lvl: <10 ug/mL — ABNORMAL LOW (ref 50.0–100.0)

## 2020-07-27 LAB — CBC WITH DIFFERENTIAL/PLATELET
Abs Immature Granulocytes: 0.02 10*3/uL (ref 0.00–0.07)
Basophils Absolute: 0 10*3/uL (ref 0.0–0.1)
Basophils Relative: 0 %
Eosinophils Absolute: 0.1 10*3/uL (ref 0.0–0.5)
Eosinophils Relative: 1 %
HCT: 39.9 % (ref 39.0–52.0)
Hemoglobin: 13.3 g/dL (ref 13.0–17.0)
Immature Granulocytes: 0 %
Lymphocytes Relative: 26 %
Lymphs Abs: 2.3 10*3/uL (ref 0.7–4.0)
MCH: 29.9 pg (ref 26.0–34.0)
MCHC: 33.3 g/dL (ref 30.0–36.0)
MCV: 89.7 fL (ref 80.0–100.0)
Monocytes Absolute: 0.8 10*3/uL (ref 0.1–1.0)
Monocytes Relative: 9 %
Neutro Abs: 5.8 10*3/uL (ref 1.7–7.7)
Neutrophils Relative %: 64 %
Platelets: 193 10*3/uL (ref 150–400)
RBC: 4.45 MIL/uL (ref 4.22–5.81)
RDW: 13.7 % (ref 11.5–15.5)
WBC: 9 10*3/uL (ref 4.0–10.5)
nRBC: 0 % (ref 0.0–0.2)

## 2020-07-27 MED ORDER — OXYCODONE-ACETAMINOPHEN 5-325 MG PO TABS: 1.0000 | ORAL_TABLET | Freq: Four times a day (QID) | ORAL | 0 refills | Status: DC | PRN

## 2020-07-27 MED ORDER — TAMSULOSIN HCL 0.4 MG PO CAPS: 0.4000 mg | ORAL_CAPSULE | Freq: Every day | ORAL | 0 refills | Status: DC

## 2020-07-27 MED ORDER — OXYCODONE-ACETAMINOPHEN 5-325 MG PO TABS
1.0000 | ORAL_TABLET | Freq: Once | ORAL | Status: AC
  Administered 2020-07-27: 1 via ORAL
  Filled 2020-07-27: qty 1

## 2020-07-27 NOTE — ED Notes (Signed)
Patient verbalizes understanding of discharge instructions. Opportunity for questioning and answers were provided. Pt discharged from ED. 

## 2020-07-27 NOTE — ED Provider Notes (Signed)
[facility] EMERGENCY DEPARTMENT Provider Note   CSN: [id number] Arrival date & time: 07/27/20  5701     History Chief Complaint  Patient presents with  . Urinary Tract Infection    KWESI SANGHA is a 50 y.o. male.  Patient is a 50 year old male with a history of diabetes, hypertension, nonischemic cardiomyopathy/CHF on Lasix who is presenting today with 4 days of worsening urinary frequency, urgency and feelings of urinary retention.  Patient reports that he has the urge to go but will only have a small amount that comes out.  In the last 3 days he started to have persistent flank pain bilaterally as well.  He has not had significant abdominal pain denies any fever, nausea or vomiting.  His urine has appeared to have blood in it and a foul smell.  No prior history of similar symptoms.  He denies any rectal pain or changes in his stool.  No recent medication changes.  No new shortness of breath or cough.    The history is provided by the patient.  Urinary Tract Infection      Past Medical History:  Diagnosis Date  . Anxiety   . Biventricular ICD (implantable cardioverter-defibrillator) in place   . Chronic systolic CHF (congestive heart failure) (HCC)   . Cocaine use   . Diabetes mellitus without complication (HCC)   . GERD (gastroesophageal reflux disease)   . HLD (hyperlipidemia)   . Hypertension   . Morbid obesity (HCC)   . NICM (nonischemic cardiomyopathy) (HCC)   . OSA (obstructive sleep apnea)   . PTSD (post-traumatic stress disorder)    on Depakote  . Refusal of blood transfusions as patient is Jehovah's Witness   . Tobacco abuse     Patient Active Problem List   Diagnosis Date Noted  . Leukocytosis 07/26/2019  . Nonischemic cardiomyopathy (HCC)   . Acute respiratory failure with hypoxia (HCC) 05/05/2019  . Rhinovirus infection 05/05/2019  . Morbid obesity (HCC) 05/05/2019  . Suspected COVID-19 virus infection 05/05/2019  . Acute  intractable headache   . Cocaine abuse (HCC)   . Acute non intractable tension-type headache   . Blurred vision, bilateral   . Chronic systolic CHF (congestive heart failure) (HCC) 01/17/2019  . Pacemaker   . HLD (hyperlipidemia)   . Diabetes mellitus without complication (HCC)   . Hypertension   . GERD (gastroesophageal reflux disease)   . Chest pain   . Tobacco abuse   . Alcohol abuse     Past Surgical History:  Procedure Laterality Date  . BIV ICD INSERTION CRT-D    . FOOT FRACTURE SURGERY    . ROTATOR CUFF REPAIR    . TONSILLECTOMY         Family History  Problem Relation Age of Onset  . Hypertension Mother   . Bone cancer Father   . Lung cancer Father     Social History   Tobacco Use  . Smoking status: Current Every Day Smoker    Types: Cigarettes  . Smokeless tobacco: Never Used  Vaping Use  . Vaping Use: Never used  Substance Use Topics  . Alcohol use: Yes  . Drug use: Yes    Types: Cocaine    Home Medications Prior to Admission medications   Medication Sig Start Date End Date Taking? Authorizing Provider  clopidogrel (PLAVIX) 75 MG tablet Take 75 mg by mouth daily. 01/13/19   [provider]  divalproex (DEPAKOTE) 500 MG DR tablet Take 500 mg  by mouth 2 (two) times a day. 06/30/18   [provider]  furosemide (LASIX) 80 MG tablet Take 80 mg by mouth daily. 09/13/18   [provider]  HUMALOG MIX 75/25 KWIKPEN (75-25) 100 UNIT/ML Kwikpen Inject 60 Units into the skin 3 (three) times daily before meals.  01/13/19   [provider]  isosorbide mononitrate (IMDUR) 30 MG 24 hr tablet Take 1 tablet (30 mg total) by mouth daily. 07/28/19   Black, Lesle Chris, NP  meclizine (ANTIVERT) 25 MG tablet Take 1 tablet (25 mg total) by mouth 3 (three) times daily as needed for dizziness. 06/26/20   Alvira Monday, MD  metoprolol succinate (TOPROL-XL) 100 MG 24 hr tablet Take 100 mg by mouth daily. Take with or immediately following a meal.     [provider]  ondansetron (ZOFRAN ODT) 4 MG disintegrating tablet Take 1 tablet (4 mg total) by mouth every 8 (eight) hours as needed for nausea or vomiting. 06/26/20   Alvira Monday, MD  pantoprazole (PROTONIX) 40 MG tablet Take 1 tablet (40 mg total) by mouth 2 (two) times daily. 07/27/19   Joseph Art, DO  prazosin (MINIPRESS) 2 MG capsule Take 2 mg by mouth at bedtime. 10/28/18   [provider]  rosuvastatin (CRESTOR) 20 MG tablet Take 20 mg by mouth daily. 09/21/18   [provider]  sertraline (ZOLOFT) 50 MG tablet Take 150 mg by mouth daily.  10/28/18   [provider]  traZODone (DESYREL) 100 MG tablet Take 200 mg by mouth at bedtime.  12/19/18   [provider]    Allergies    Aspirin, Iodine-131, Iodinated diagnostic agents, Latex, Penicillins, Amoxicillin-pot clavulanate, and Lisinopril  Review of Systems   Review of Systems  All other systems reviewed and are negative.   Physical Exam Updated Vital Signs BP (!) 140/111 (BP Location: Right Wrist)   Pulse 74   Temp 98.7 F (37.1 C) (Oral)   Resp 15   Ht 5' 5.5" (1.664 m)   SpO2 94%   BMI 48.66 kg/m   Physical Exam Vitals and nursing note reviewed.  Constitutional:      General: He is not in acute distress.    Appearance: Normal appearance. He is well-developed and well-nourished.  HENT:     Head: Normocephalic and atraumatic.     Mouth/Throat:     Mouth: Oropharynx is clear and moist.  Eyes:     Extraocular Movements: EOM normal.     Conjunctiva/sclera: Conjunctivae normal.     Pupils: Pupils are equal, round, and reactive to light.  Cardiovascular:     Rate and Rhythm: Normal rate and regular rhythm.     Pulses: Normal pulses and intact distal pulses.     Heart sounds: No murmur heard.   Pulmonary:     Effort: Pulmonary effort is normal. No respiratory distress.     Breath sounds: Normal breath sounds. No wheezing or rales.  Abdominal:     General: There  is no distension.     Palpations: Abdomen is soft.     Tenderness: There is no abdominal tenderness. There is right CVA tenderness and left CVA tenderness. There is no guarding or rebound.  Musculoskeletal:        General: No tenderness or edema. Normal range of motion.     Cervical back: Normal range of motion and neck supple.     Right lower leg: No edema.     Left lower leg: No edema.  Skin:    General: Skin is warm and dry.     Capillary Refill: Capillary refill takes less than 2 seconds.     Findings: No erythema or rash.  Neurological:     General: No focal deficit present.     Mental Status: He is alert and oriented to person, place, and time. Mental status is at baseline.     Sensory: No sensory deficit.     Motor: No weakness.     Gait: Gait normal.  Psychiatric:        Mood and Affect: Mood and affect and mood normal.        Behavior: Behavior normal.        Thought Content: Thought content normal.     ED Results / Procedures / Treatments   Labs (all labs ordered are listed, but only abnormal results are displayed) Labs Reviewed  COMPREHENSIVE METABOLIC PANEL - Abnormal; Notable for the following components:      Result Value   Glucose, Bld 165 (*)    Calcium 8.6 (*)    Total Protein 5.9 (*)    Albumin 3.4 (*)    All other components within normal limits  URINALYSIS, ROUTINE W REFLEX MICROSCOPIC - Abnormal; Notable for the following components:   Protein, ur 30 (*)    All other components within normal limits  URINE CULTURE  CBC WITH DIFFERENTIAL/PLATELET  VALPROIC ACID LEVEL    EKG None  Radiology No results found.  Procedures Procedures   Medications Ordered in ED Medications  oxyCODONE-acetaminophen (PERCOCET/ROXICET) 5-325 MG per tablet 1 tablet (has no administration in time range)    ED Course  I have reviewed the triage vital signs and the nursing notes.  Pertinent labs & imaging results that were available during my care of the patient  were reviewed by me and considered in my medical decision making (see chart for details).    MDM Rules/Calculators/A&P                          50 year old male presenting today with complaints of urinary frequency, urgency and back pain.  Bedside ultrasound does show some urine in the bladder and patient attempted to urinate and was only able to get 2 cc out and there is concern for retention.  Also concern for UTI and pyelonephritis.  Will ensure no evidence of AKI today.  Low suspicion for cauda equina and spinal cord issue.  Labs and urine are pending.  Bladder scan pending.  11:44 AM Bladder scan is inaccurate and says only 65mL which beside U/S shows more than that.  UA without signs of infection and CBC, CMP wnl.  Concern for retention.  Pt does take trazadone but has been a long term medication and unlikely to be the problem.  NO blood in urine today to suggest kidney stone or cancer.  Will do an in and out cath to assess amount of volume pt is holding.    12:36 PM Pt has of urine total but does not appear that pt need foley at this time and catheter removed.  Pt will be given flomax and referral to urology.  MDM Number of Diagnoses or Management Options   Amount and/or Complexity of Data Reviewed Clinical lab tests: ordered and reviewed Obtain history from someone other than the patient: no  Risk of Complications, Morbidity, and/or Mortality Presenting problems: moderate Diagnostic procedures: low Management options: low  Patient Progress Patient progress: improved  Final Clinical Impression(s) / ED Diagnoses Final diagnoses:  Urinary frequency  Benign prostatic hyperplasia with incomplete bladder emptying    Rx / DC Orders ED Discharge Orders         Ordered    oxyCODONE-acetaminophen (PERCOCET/ROXICET) 5-325 MG tablet  Every 6 hours PRN        07/27/20 1244    tamsulosin (FLOMAX) 0.4 MG CAPS capsule  Daily after supper        07/27/20 1244            Gwyneth Sprout, MD 07/27/20 1246

## 2020-07-27 NOTE — ED Notes (Signed)
Pt in and out cath'd.  Pt felt immediate relief of pressure as soon as the catheter entered the bladder. 200 ml output.

## 2020-07-27 NOTE — ED Notes (Signed)
Foley catheter removed

## 2020-07-27 NOTE — Discharge Instructions (Signed)
No signs of kidney problems or infection in your urine today.  You were holding some urine but not a terrible amount.  You need to follow up with the specialist but in the meantime you were given a medicine which will hopefully help you urinate better.

## 2020-07-27 NOTE — ED Triage Notes (Signed)
Pt here via EMS for bil flank pain, decreased urination and foul smelling urine.  Hx of 5 day stay in hosp approx month ago for Covid.

## 2020-07-28 LAB — URINE CULTURE

## 2020-07-28 NOTE — Progress Notes (Deleted)
Cardiology Office Note:   Date:  07/28/2020  NAME:  Luis [name]    MRN: [id number] DOB:  11-20-1970   PCP:  Margot Ables, MD  Cardiologist:  Bryan Lemma, MD  Electrophysiologist:  None   Referring MD: Margot Ables*   No chief complaint on file. ***  History of Present Illness:   Luis [name] is a 50 y.o. male with a hx of systolic HF, HTN, DM, cocaine use, CAD who is being seen today for the evaluation of systolic HF at the request of Margot Ables, MD.  Problem List 1. CAD -NM Stress 2019 [facility]) with infarct and periinfarct ischemia  2. Systolic HF -EF 25-30% -BIV ICD in place  3. DM -A1c 6.8 4. Cocaine use  5. OSA  Past Medical History: Past Medical History:  Diagnosis Date  . Anxiety   . Biventricular ICD (implantable cardioverter-defibrillator) in place   . Chronic systolic CHF (congestive heart failure) (HCC)   . Cocaine use   . Diabetes mellitus without complication (HCC)   . GERD (gastroesophageal reflux disease)   . HLD (hyperlipidemia)   . Hypertension   . Morbid obesity (HCC)   . NICM (nonischemic cardiomyopathy) (HCC)   . OSA (obstructive sleep apnea)   . PTSD (post-traumatic stress disorder)    on Depakote  . Refusal of blood transfusions as patient is Jehovah's Witness   . Tobacco abuse     Past Surgical History: Past Surgical History:  Procedure Laterality Date  . BIV ICD INSERTION CRT-D    . FOOT FRACTURE SURGERY    . ROTATOR CUFF REPAIR    . TONSILLECTOMY      Current Medications: No outpatient medications have been marked as taking for the 07/29/20 encounter (Appointment) with Sande Rives, MD.     Allergies:    Aspirin, Iodine-131, Iodinated diagnostic agents, Latex, Penicillins, Amoxicillin-pot clavulanate, and Lisinopril   Social History: Social History   Socioeconomic History  . Marital status: Legally Separated    Spouse name: Not on file  . Number of children: Not  on file  . Years of education: Not on file  . Highest education level: Not on file  Occupational History  . Not on file  Tobacco Use  . Smoking status: Current Every Day Smoker    Types: Cigarettes  . Smokeless tobacco: Never Used  Vaping Use  . Vaping Use: Never used  Substance and Sexual Activity  . Alcohol use: Yes  . Drug use: Yes    Types: Cocaine  . Sexual activity: Not on file  Other Topics Concern  . Not on file  Social History Narrative  . Not on file   Social Determinants of Health   Financial Resource Strain: Not on file  Food Insecurity: Not on file  Transportation Needs: Not on file  Physical Activity: Not on file  Stress: Not on file  Social Connections: Not on file     Family History: The patient's ***family history includes Bone cancer in his father; Hypertension in his mother; Lung cancer in his father.  ROS:   All other ROS reviewed and negative. Pertinent positives noted in the HPI.     EKGs/Labs/Other Studies Reviewed:   The following studies were personally reviewed by me today:  EKG:  EKG is *** ordered today.  The ekg ordered today demonstrates ***, and was personally reviewed by me.   TTE 05/20/2018 [facility]) Technically difficult and limited study.  The left ventricular endocardium was not well visualized. Grossly  Moderate to  severe global LV hypokinesis.  Ejection fraction is visually estimated at 25-30%  No previous echo   NM Stress 05/20/2018 [facility]) 1. Reversibility in the mid apex, felt to represent a focus of  ischemia. Partial reversibility in the inferior wall, consistent  with prior inferior wall infarct with peri-infarct ischemia. No  other lesions identified on the SPECT study.   2. Prominent left ventricle. Diffuse severe left ventricular wall  hypokinesia. Suspect cardiomyopathy.   3. Left ventricular ejection fraction 26%   4. Non invasive risk stratification*: High    Recent Labs: 07/27/2020: ALT 13; BUN 6;  Creatinine, Ser 0.74; Hemoglobin 13.3; Platelets 193; Potassium 3.9; Sodium 139   Recent Lipid Panel    Component Value Date/Time   CHOL 114 07/26/2019 0805   TRIG 244 (H) 07/26/2019 0805   HDL 34 (L) 07/26/2019 0805   CHOLHDL 3.4 07/26/2019 0805   VLDL 49 (H) 07/26/2019 0805   LDLCALC 31 07/26/2019 0805    Physical Exam:   VS:  There were no vitals taken for this visit.   Wt Readings from Last 3 Encounters:  07/27/19 296 lb 14.4 oz (134.7 kg)  05/04/19 (!) 309 lb (140.2 kg)  03/25/19 (!) 320 lb (145.2 kg)    General: Well nourished, well developed, in no acute distress Head: Atraumatic, normal size  Eyes: PEERLA, EOMI  Neck: Supple, no JVD Endocrine: No thryomegaly Cardiac: Normal S1, S2; RRR; no murmurs, rubs, or gallops Lungs: Clear to auscultation bilaterally, no wheezing, rhonchi or rales  Abd: Soft, nontender, no hepatomegaly  Ext: No edema, pulses 2+ Musculoskeletal: No deformities, BUE and BLE strength normal and equal Skin: Warm and dry, no rashes   Neuro: Alert and oriented to person, place, time, and situation, CNII-XII grossly intact, no focal deficits  Psych: Normal mood and affect   ASSESSMENT:   Luis [name] is a 50 y.o. male who presents for the following: No diagnosis found.  PLAN:   There are no diagnoses linked to this encounter.  Disposition: No follow-ups on file.  Medication Adjustments/Labs and Tests Ordered: Current medicines are reviewed at length with the patient today.  Concerns regarding medicines are outlined above.  No orders of the defined types were placed in this encounter.  No orders of the defined types were placed in this encounter.   There are no Patient Instructions on file for this visit.   Time Spent with Patient: I have spent a total of *** minutes with patient reviewing hospital notes, telemetry, EKGs, labs and examining the patient as well as establishing an assessment and plan that was discussed with the patient.   > 50% of time was spent in direct patient care.  Signed, Lenna Gilford. Flora Lipps, MD [facility]  [address] [place], [place] [address] [phone]  07/28/2020 3:59 PM

## 2020-07-29 ENCOUNTER — Ambulatory Visit: Payer: Medicare Other | Admitting: Cardiovascular Disease

## 2020-07-29 DIAGNOSIS — E782 Mixed hyperlipidemia: Secondary | ICD-10-CM

## 2020-07-29 DIAGNOSIS — I2510 Atherosclerotic heart disease of native coronary artery without angina pectoris: Secondary | ICD-10-CM

## 2020-07-29 DIAGNOSIS — I5022 Chronic systolic (congestive) heart failure: Secondary | ICD-10-CM

## 2020-07-31 ENCOUNTER — Ambulatory Visit (INDEPENDENT_AMBULATORY_CARE_PROVIDER_SITE_OTHER): Payer: Medicare Other | Admitting: Neurology

## 2020-07-31 ENCOUNTER — Encounter: Payer: Self-pay | Admitting: Neurology

## 2020-07-31 ENCOUNTER — Other Ambulatory Visit: Payer: Self-pay

## 2020-07-31 VITALS — BP 136/64 | HR 66 | Ht 65.5 in | Wt 267.0 lb

## 2020-07-31 DIAGNOSIS — G4733 Obstructive sleep apnea (adult) (pediatric): Secondary | ICD-10-CM

## 2020-07-31 DIAGNOSIS — Z9989 Dependence on other enabling machines and devices: Secondary | ICD-10-CM

## 2020-07-31 DIAGNOSIS — I5022 Chronic systolic (congestive) heart failure: Secondary | ICD-10-CM

## 2020-07-31 DIAGNOSIS — Z95810 Presence of automatic (implantable) cardiac defibrillator: Secondary | ICD-10-CM | POA: Diagnosis not present

## 2020-07-31 NOTE — Patient Instructions (Signed)

## 2020-07-31 NOTE — Progress Notes (Signed)
Subjective:    Patient ID: Luis [name] is a 50 y.o. male.  HPI     [name], MD, PhD [facility] [address], [address] [address] [place], [place] [address]  Dear Luis [name],   I saw your patient, Luis [name], upon your kind request, in my Sleep clinic today for initial consultation by his sleep disorder, in particular, evaluation of his prior diagnosis of obstructive sleep apnea.  The patient is unaccompanied today.  As you know, Luis [name] is a 50 year old right-handed gentleman with an underlying complex medical history of chronic systolic congestive heart failure, nonischemic cardiomyopathy, status post ICD placement, diabetes, reflux disease, hypertension, hyperlipidemia, and morbid obesity with a BMI of over 45, who was previously diagnosed with obstructive sleep apnea and placed on CPAP therapy.  I reviewed your office note from 06/11/2020.  Prior sleep study results are not available for my review today.  He has been on CPAP therapy for years.  He has been able to lose weight.  His maximum weight was over 400 pounds.   A CPAP compliance download was not available, he did not bring his machine today.  His machine is over 49 years old, last sleep study was over 10 years ago in [place].  Per his report, he was diagnosed with severe obstructive sleep apnea when he was 50 years old.  He had a tonsillectomy, adenoidectomy and uvulectomy as a teenager.  He does not go without his CPAP.  He reports that his pressure is 14 and uses a medium full facemask from [employer].  He had chemotherapy today, goes to [facility] for this for his prostate cancer.  His prostate cancer is followed at the [facility] in [place].  He is widowed, wife passed away last year.  He has 7 children including 2 sets of twins.  He works part-time as a Production designer, theatre/television/film at [employer].  Bedtime is generally between 11 and midnight and rise time around 7 AM.  He does not have night to night nocturia  and denies recurrent morning headaches, is not aware of any family history of OSA.  He lives alone.  He does not drink caffeine, he does not drink alcohol and he smokes cigarettes occasionally. His Epworth sleepiness score is 8 out of 24, fatigue severity score is 15 out of 63.  His Past Medical History Is Significant For: Past Medical History:  Diagnosis Date  . Anxiety   . Biventricular ICD (implantable cardioverter-defibrillator) in place   . Chronic systolic CHF (congestive heart failure) (HCC)   . Cocaine use   . Diabetes mellitus without complication (HCC)   . GERD (gastroesophageal reflux disease)   . HLD (hyperlipidemia)   . Hypertension   . Morbid obesity (HCC)   . NICM (nonischemic cardiomyopathy) (HCC)   . OSA (obstructive sleep apnea)   . PTSD (post-traumatic stress disorder)    on Depakote  . Refusal of blood transfusions as patient is Jehovah's Witness   . Tobacco abuse     His Past Surgical History Is Significant For: Past Surgical History:  Procedure Laterality Date  . BIV ICD INSERTION CRT-D    . FOOT FRACTURE SURGERY    . ROTATOR CUFF REPAIR    . TONSILLECTOMY      His Family History Is Significant For: Family History  Problem Relation Age of Onset  . Hypertension Mother   . Bone cancer Father   . Lung cancer Father     His Social History Is Significant  For: Social History   Socioeconomic History  . Marital status: Legally Separated    Spouse name: Not on file  . Number of children: Not on file  . Years of education: Not on file  . Highest education level: Not on file  Occupational History  . Not on file  Tobacco Use  . Smoking status: Current Every Day Smoker    Types: Cigarettes  . Smokeless tobacco: Never Used  Vaping Use  . Vaping Use: Never used  Substance and Sexual Activity  . Alcohol use: Yes  . Drug use: Yes    Types: Cocaine  . Sexual activity: Not on file  Other Topics Concern  . Not on file  Social History Narrative  . Not  on file   Social Determinants of Health   Financial Resource Strain: Not on file  Food Insecurity: Not on file  Transportation Needs: Not on file  Physical Activity: Not on file  Stress: Not on file  Social Connections: Not on file    His Allergies Are:  Allergies  Allergen Reactions  . Aspirin Anaphylaxis    Throat closing   . Iodine-131 Hives    Hives  . Iodinated Diagnostic Agents Hives  . Latex Rash and Hives  . Penicillins Nausea And Vomiting  . Amoxicillin-Pot Clavulanate Diarrhea  . Lisinopril     Other reaction(s): Cough  :   His Current Medications Are:  Outpatient Encounter Medications as of 07/31/2020  Medication Sig  . clopidogrel (PLAVIX) 75 MG tablet Take 75 mg by mouth daily.  . divalproex (DEPAKOTE) 500 MG DR tablet Take 500 mg by mouth 2 (two) times a day.  . furosemide (LASIX) 80 MG tablet Take 80 mg by mouth daily.  Marland Kitchen HUMALOG MIX 75/25 KWIKPEN (75-25) 100 UNIT/ML Kwikpen Inject 60 Units into the skin 3 (three) times daily before meals.   . isosorbide mononitrate (IMDUR) 30 MG 24 hr tablet Take 1 tablet (30 mg total) by mouth daily.  . meclizine (ANTIVERT) 25 MG tablet Take 1 tablet (25 mg total) by mouth 3 (three) times daily as needed for dizziness.  . metoprolol succinate (TOPROL-XL) 100 MG 24 hr tablet Take 100 mg by mouth daily. Take with or immediately following a meal.  . ondansetron (ZOFRAN ODT) 4 MG disintegrating tablet Take 1 tablet (4 mg total) by mouth every 8 (eight) hours as needed for nausea or vomiting.  Marland Kitchen oxyCODONE-acetaminophen (PERCOCET/ROXICET) 5-325 MG tablet Take 1-2 tablets by mouth every 6 (six) hours as needed for severe pain.  . pantoprazole (PROTONIX) 40 MG tablet Take 1 tablet (40 mg total) by mouth 2 (two) times daily.  . prazosin (MINIPRESS) 2 MG capsule Take 2 mg by mouth at bedtime.  . rosuvastatin (CRESTOR) 20 MG tablet Take 20 mg by mouth daily.  . sertraline (ZOLOFT) 50 MG tablet Take 150 mg by mouth daily.   . tamsulosin  (FLOMAX) 0.4 MG CAPS capsule Take 1 capsule (0.4 mg total) by mouth daily after supper.  . traZODone (DESYREL) 100 MG tablet Take 200 mg by mouth at bedtime.    No facility-administered encounter medications on file as of 07/31/2020.  :  Review of Systems:  Out of a complete 14 point review of systems, all are reviewed and negative with the exception of these symptoms as listed below: Review of Systems  Neurological:       Here for sleep consult. Prior sleep study was 10 years ago. He has been on CPAP since the age of  15, his current machine is 10+ years. Pt did not bring cpap today due to the fact he finished up a chemo treatment before transport brought him here. Epworth Sleepiness Scale 0= would never doze 1= slight chance of dozing 2= moderate chance of dozing 3= high chance of dozing  Sitting and reading:3 Watching TV:3 Sitting inactive in a public place (ex. Theater or meeting):1 As a passenger in a car for an hour without a break:1 Lying down to rest in the afternoon:0 Sitting and talking to someone:0 Sitting quietly after lunch (no alcohol):0 In a car, while stopped in traffic:0 Total:8     Objective:  Neurological Exam  Physical Exam Physical Examination:   Vitals:   07/31/20 1320  BP: 136/64  Pulse: 66  SpO2: 94%    General Examination: The patient is a very pleasant 50 y.o. male in no acute distress. He appears well-developed and well-nourished and well groomed.   HEENT: Normocephalic, atraumatic, pupils are equal, round and reactive to light, extraocular tracking is good without limitation to gaze excursion or nystagmus noted. Hearing is grossly intact. Face is symmetric with normal facial animation. Speech is clear with no dysarthria noted. There is no hypophonia. There is no lip, neck/head, jaw or voice tremor. Neck is supple with full range of passive and active motion. There are no carotid bruits on auscultation. Oropharynx exam reveals: mild mouth dryness,  adequate dental hygiene and moderate airway crowding, due to redundant soft palate, uvula is absent, tonsils absent, neck circumference of 19 three-quarter inches.  Tongue protrudes centrally and palate elevates symmetrically.  Chest: Clear to auscultation without wheezing, rhonchi or crackles noted.  Heart: S1+S2+0, regular and normal without murmurs, rubs or gallops noted.   Abdomen: Soft, non-tender and non-distended with normal bowel sounds appreciated on auscultation.  Extremities: There is no pitting edema in the distal lower extremities bilaterally.   Skin: Warm and dry without trophic changes noted.   Musculoskeletal: exam reveals no obvious joint deformities, tenderness or joint swelling or erythema.   Neurologically:  Mental status: The patient is awake, alert and oriented in all 4 spheres. His immediate and remote memory, attention, language skills and fund of knowledge are appropriate. There is no evidence of aphasia, agnosia, apraxia or anomia. Speech is clear with normal prosody and enunciation. Thought process is linear. Mood is normal and affect is normal.  Cranial nerves II - XII are as described above under HEENT exam.  Motor exam: Normal bulk, strength and tone is noted. There is no tremor, fine motor skills and coordination: grossly intact.  Cerebellar testing: No dysmetria or intention tremor. There is no truncal or gait ataxia.  Sensory exam: intact to light touch in the upper and lower extremities.  Gait, station and balance: He stands with mild difficulty and slowly, posture is age-appropriate. Gait shows normal stride length and normal pace. No problems turning are noted.   Assessment and plan:   In summary, JUERGEN HARDENBROOK is a very pleasant 50 y.o.-year old male with an underlying complex medical history of chronic systolic congestive heart failure, nonischemic cardiomyopathy, status post ICD placement, diabetes, prostate cancer, reflux disease, hypertension,  hyperlipidemia, and morbid obesity with a BMI of over 45, who presents for evaluation of his obstructive sleep apnea of several years duration.  He was diagnosed when he was a teenager and had sleep apnea surgery as well.  He has been on CPAP therapy for years.  Current machine is at least 52 years old, he did  not bring his machine and compliance data was not available but he reports full compliance.  He reports a pressure of 14 cm via medium full facemask and DME company is [employer].  He should qualify for new equipment, he needs a new sleep study for reevaluation.  He is agreeable to coming in for sleep testing.  I would like for him to come in for a split-night sleep study so we can have a diagnostic portion and be able to titrate him in the 2nd part of the night.  He is agreeable to pursuing this.  We talked about the importance of healthy lifestyle, weight loss, smoking cessation and he is encouraged to pursue these lifestyle changes as an ongoing adjunct health measure.  He is advised to follow-up after sleep testing and we will call him with the results in the interim as well.  He reports having benefited from treatment over the years.  I answered all his questions today and he was in agreement with the plan.  Thank you very much for allowing me to participate in the care of this nice patient. If I can be of any further assistance to you please do not hesitate to call me at [phone].  Sincerely,   [name], MD, PhD

## 2020-08-06 ENCOUNTER — Telehealth: Payer: Self-pay

## 2020-08-06 NOTE — Telephone Encounter (Signed)
Attempting to schedule sleep study. Number states it cant accept phone calls at this time.

## 2020-08-06 NOTE — Progress Notes (Deleted)
Cardiology Office Note:   Date:  08/06/2020  NAME:  Luis [name]    MRN: [id number] DOB:  Jun 01, 1971   PCP:  Margot Ables, MD  Cardiologist:  Bryan Lemma, MD  Electrophysiologist:  None   Referring MD: Margot Ables*   No chief complaint on file. ***  History of Present Illness:   Luis [name] is a 50 y.o. male with a hx of CHF, DM, HTN, obesity, cocaine use who is being seen today for the evaluation of CHF at the request of Margot Ables, MD. Has followed at [facility]. Seen at [facility] ER 06/2019 for cocaine chest pain.   Problem List 1. CHF -non-ischemic -EF 35-40% with BiV ICD -CCTA 01/19/2019: normal 2. DM 3. HTN 4. Obesity -BMI 43 5. Cocaine use   Past Medical History: Past Medical History:  Diagnosis Date  . Anxiety   . Biventricular ICD (implantable cardioverter-defibrillator) in place   . Chronic systolic CHF (congestive heart failure) (HCC)   . Cocaine use   . Diabetes mellitus without complication (HCC)   . GERD (gastroesophageal reflux disease)   . HLD (hyperlipidemia)   . Hypertension   . Morbid obesity (HCC)   . NICM (nonischemic cardiomyopathy) (HCC)   . OSA (obstructive sleep apnea)   . PTSD (post-traumatic stress disorder)    on Depakote  . Refusal of blood transfusions as patient is Jehovah's Witness   . Tobacco abuse     Past Surgical History: Past Surgical History:  Procedure Laterality Date  . BIV ICD INSERTION CRT-D    . FOOT FRACTURE SURGERY    . ROTATOR CUFF REPAIR    . TONSILLECTOMY      Current Medications: No outpatient medications have been marked as taking for the 08/07/20 encounter (Appointment) with Sande Rives, MD.     Allergies:    Aspirin, Iodine-131, Iodinated diagnostic agents, Latex, Penicillins, Amoxicillin-pot clavulanate, and Lisinopril   Social History: Social History   Socioeconomic History  . Marital status: Legally Separated    Spouse name: Not on file  .  Number of children: Not on file  . Years of education: Not on file  . Highest education level: Not on file  Occupational History  . Not on file  Tobacco Use  . Smoking status: Current Every Day Smoker    Types: Cigarettes  . Smokeless tobacco: Never Used  Vaping Use  . Vaping Use: Never used  Substance and Sexual Activity  . Alcohol use: Yes  . Drug use: Yes    Types: Cocaine  . Sexual activity: Not on file  Other Topics Concern  . Not on file  Social History Narrative  . Not on file   Social Determinants of Health   Financial Resource Strain: Not on file  Food Insecurity: Not on file  Transportation Needs: Not on file  Physical Activity: Not on file  Stress: Not on file  Social Connections: Not on file     Family History: The patient's ***family history includes Bone cancer in his father; Hypertension in his mother; Lung cancer in his father.  ROS:   All other ROS reviewed and negative. Pertinent positives noted in the HPI.     EKGs/Labs/Other Studies Reviewed:   The following studies were personally reviewed by me today:  EKG:  EKG is *** ordered today.  The ekg ordered today demonstrates ***, and was personally reviewed by me.   TTE 07/26/2019 1. Left ventricular ejection fraction, by visual estimation, is 35 to  40%.  The left ventricle has moderate to severely decreased function. There  is moderately increased left ventricular hypertrophy.  2. Left ventricular diastolic parameters are consistent with Grade II  diastolic dysfunction (pseudonormalization).  3. Mild to moderately dilated left ventricular internal cavity size.  4. The left ventricle demonstrates global hypokinesis.  5. Global right ventricle has normal systolic function.The right  ventricular size is normal. No increase in right ventricular wall  thickness.  6. Left atrial size was normal.  7. Right atrial size was normal.  8. Trivial pericardial effusion is present.  9. The mitral  valve is normal in structure. Trivial mitral valve  regurgitation.  10. The tricuspid valve is normal in structure.  11. The tricuspid valve is normal in structure. Tricuspid valve  regurgitation is trivial.  12. The aortic valve is tricuspid. Aortic valve regurgitation is not  visualized. No evidence of aortic valve sclerosis or stenosis.  13. The pulmonic valve was grossly normal. Pulmonic valve regurgitation is  not visualized.  14. Mildly elevated pulmonary artery systolic pressure.  15. The inferior vena cava is normal in size with greater than 50%  respiratory variability, suggesting right atrial pressure of 3 mmHg.  16. No prior Echocardiogram.   CCTA 01/19/2019  1. Coronary calcium not possible to assess secondary to artifact with BiV ICD leads, however no calcifications seen in the visualized portions of coronary arteries.  2. Normal coronary origin with left dominance.  3. This study is of very limited quality with significant artifact from leads of biventricular pacemaker. There is no obvious plaque in the visualized portions of coronary arteries - LM, proximal, mid LAD, proximal and mid portion of LCX artery and PDA.  Recent Labs: 07/27/2020: ALT 13; BUN 6; Creatinine, Ser 0.74; Hemoglobin 13.3; Platelets 193; Potassium 3.9; Sodium 139   Recent Lipid Panel    Component Value Date/Time   CHOL 114 07/26/2019 0805   TRIG 244 (H) 07/26/2019 0805   HDL 34 (L) 07/26/2019 0805   CHOLHDL 3.4 07/26/2019 0805   VLDL 49 (H) 07/26/2019 0805   LDLCALC 31 07/26/2019 0805    Physical Exam:   VS:  There were no vitals taken for this visit.   Wt Readings from Last 3 Encounters:  07/31/20 267 lb (121.1 kg)  07/27/19 296 lb 14.4 oz (134.7 kg)  05/04/19 (!) 309 lb (140.2 kg)    General: Well nourished, well developed, in no acute distress Head: Atraumatic, normal size  Eyes: PEERLA, EOMI  Neck: Supple, no JVD Endocrine: No thryomegaly Cardiac: Normal S1, S2; RRR; no  murmurs, rubs, or gallops Lungs: Clear to auscultation bilaterally, no wheezing, rhonchi or rales  Abd: Soft, nontender, no hepatomegaly  Ext: No edema, pulses 2+ Musculoskeletal: No deformities, BUE and BLE strength normal and equal Skin: Warm and dry, no rashes   Neuro: Alert and oriented to person, place, time, and situation, CNII-XII grossly intact, no focal deficits  Psych: Normal mood and affect   ASSESSMENT:   Luis [name] is a 50 y.o. male who presents for the following: No diagnosis found.  PLAN:   There are no diagnoses linked to this encounter.  Disposition: No follow-ups on file.  Medication Adjustments/Labs and Tests Ordered: Current medicines are reviewed at length with the patient today.  Concerns regarding medicines are outlined above.  No orders of the defined types were placed in this encounter.  No orders of the defined types were placed in this encounter.   There are no Patient Instructions on file  for this visit.   Time Spent with Patient: I have spent a total of *** minutes with patient reviewing hospital notes, telemetry, EKGs, labs and examining the patient as well as establishing an assessment and plan that was discussed with the patient.  > 50% of time was spent in direct patient care.  Signed, Lenna Gilford. Flora Lipps, MD [facility]  [address], [address] [place], [place] [address] [phone]  08/06/2020 8:45 PM

## 2020-08-07 ENCOUNTER — Ambulatory Visit: Payer: Medicare Other | Admitting: Cardiovascular Disease

## 2020-08-07 DIAGNOSIS — I10 Essential (primary) hypertension: Secondary | ICD-10-CM

## 2020-08-07 DIAGNOSIS — I5022 Chronic systolic (congestive) heart failure: Secondary | ICD-10-CM

## 2020-08-07 DIAGNOSIS — E782 Mixed hyperlipidemia: Secondary | ICD-10-CM

## 2020-08-12 ENCOUNTER — Encounter: Payer: Self-pay | Admitting: General Practice

## 2020-08-29 ENCOUNTER — Ambulatory Visit (INDEPENDENT_AMBULATORY_CARE_PROVIDER_SITE_OTHER): Payer: Medicare Other | Admitting: Neurology

## 2020-08-29 DIAGNOSIS — I5022 Chronic systolic (congestive) heart failure: Secondary | ICD-10-CM

## 2020-08-29 DIAGNOSIS — G4733 Obstructive sleep apnea (adult) (pediatric): Secondary | ICD-10-CM | POA: Diagnosis not present

## 2020-08-29 DIAGNOSIS — Z95810 Presence of automatic (implantable) cardiac defibrillator: Secondary | ICD-10-CM

## 2020-08-29 DIAGNOSIS — G4720 Circadian rhythm sleep disorder, unspecified type: Secondary | ICD-10-CM

## 2020-08-30 ENCOUNTER — Other Ambulatory Visit: Payer: Self-pay

## 2020-09-10 ENCOUNTER — Telehealth: Payer: Self-pay

## 2020-09-10 NOTE — Telephone Encounter (Signed)
I called the pt and we reveiwed results of sleep study. Pt verbalized understanding and will use [employer] for local DME company. Pt was advised I would send order today and DME should reach out within 7-10 business days for next steps. Pt was advised of CPAP shortage and that start dates can be 6-12 weeks out. Pt verbalized understanding. He does have a CPAP machine at this time but is not working appropriately. I told pt I would let DME know about his current CPAP. Pt was advised once he is started on the machine we will need to see him back within 31-90 days of starting the machine. Pt will call back once he receives start date, pt verbalized understanding of information.  Pt advised I would mail letter reiterating this information, pt will call if he has any issues. Order has been sent to [employer].

## 2020-09-10 NOTE — Telephone Encounter (Signed)
-----   Message from [name], MD sent at 09/10/2020  7:37 AM EDT ----- Patient presented for reevaluation of his obstructive sleep apnea.  He has been on  CPAP therapy for years, referred by Joycelyn Man, NP.  I saw him on 07/31/2020 when he had a split-night sleep study on 08/29/2020.  Please advise patient that his sleep study confirmed severe obstructive sleep apnea.  He did well on CPAP of 17 cm.  I am not sure what pressure he has been on at home.  I do not believe he has a local DME company.  Please send his CPAP order for a new machine to a DME company of his choice.  He will need a follow-up appointment within 31 to 89 days of starting treatment.  Please remind patient to be fully compliant with treatment.

## 2020-09-10 NOTE — Procedures (Signed)
PATIENT'S NAME:  Luis, [name] DOB:      08/29/2022      MR#:    [id number]     DATE OF RECORDING: 08/29/2020 REFERRING M.D.:  Joycelyn Man, NP Study Performed:  Split-Night Titration Study HISTORY: 50 year old right-handed gentleman with an underlying complex medical history of chronic systolic congestive heart failure, nonischemic cardiomyopathy, status post ICD placement, diabetes, reflux disease, hypertension, hyperlipidemia, and morbid obesity with a BMI of over 45, who was previously diagnosed with obstructive sleep apnea and placed on CPAP therapy. He presents for re-evaluation. The patient endorsed the Epworth Sleepiness Scale at 8 points. The patient's weight 267 pounds with a height of 55 (inches), resulting in a BMI of 61.7 kg/m2. The patient's neck circumference measured 19 inches.  CURRENT MEDICATIONS: Plavix, Depakote, Lasix, Humalog, Kwikpen, Imdur, AntivertToprol-xl, zofran, Codone, Protonix, Minipress, Crestor, Zoloft, Flomax, Desyrel  PROCEDURE:  This is a multichannel digital polysomnogram utilizing the Somnostar 11.2 system.  Electrodes and sensors were applied and monitored per AASM Specifications.   EEG, EOG, Chin and Limb EMG, were sampled at 200 Hz.  ECG, Snore and Nasal Pressure, Thermal Airflow, Respiratory Effort, CPAP Flow and Pressure, Oximetry was sampled at 50 Hz. Digital video and audio were recorded.      BASELINE STUDY WITHOUT CPAP RESULTS:  Lights Out was at 22:00 and Lights On at 05:22 for the night, split start was at 23:31, epoch 188.  Total recording time (TRT) was 91, with a total sleep time (TST) of 68.5 minutes.   The patient's sleep latency was 9.5 minutes.  REM sleep was absent. The sleep efficiency was 75.3 %.    SLEEP ARCHITECTURE: WASO (Wake after sleep onset) was 8 minutes, Stage N1 was 5.5 minutes, Stage N2 was 63 minutes, Stage N3 was 0 minutes and Stage R (REM sleep) was 0 minutes.  The percentages were Stage N1 8.%, Stage N2 92.%, which is  markedly increased, Stage N3 and Stage R (REM sleep) were absent.   RESPIRATORY ANALYSIS:  There were a total of 114 respiratory events: 46 obstructive apneas, 0 central apneas and 0 mixed apneas with a total of 46 apneas and an apnea index (AI) of 40.3. There were 68 hypopneas with a hypopnea index of 59.6. The patient also had 0 respiratory event related arousals (RERAs).      The total APNEA/HYPOPNEA INDEX (AHI) was 99.9 /hour and the total RESPIRATORY DISTURBANCE INDEX was 99.9 /hour.  0 events occurred in REM sleep and 136 events in NREM. The REM AHI was n/a,  versus a non-REM AHI of 99.9 /hour. The patient spent 198.5 minutes sleep time in the supine position 187 minutes in non-supine. The supine AHI was 71.1 /hour versus a non-supine AHI of 118.6 /hour.  OXYGEN SATURATION & C02:  The wake baseline 02 saturation was 86%, with the lowest being 76%. Time spent below 89% saturation equaled 53 minutes.  PERIODIC LIMB MOVEMENTS: The patient had a total of 0 Periodic Limb Movements.  The Periodic Limb Movement (PLM) index was 0 /hour and the PLM Arousal index was 0 /hour.   Audio and video analysis did not show any abnormal or unusual movements, behaviors, phonations or vocalizations. The patient took 2 bathroom breaks. The EKG was in keeping with normal sinus rhythm (NSR).   TITRATION STUDY WITH CPAP RESULTS:   The patient was fitted with a medium Simplus FFM. CPAP was initiated at 7 cmH20 with heated humidity per AASM split night standards and pressure was advanced to 17  cmH20 because of hypopneas, apneas and desaturations.  At a PAP pressure of 17 cmH20, there was a reduction of the AHI to 0/hour with supine REM sleep achieved and O2 nadir of 90%.   Total recording time (TRT) was 351.5 minutes, with a total sleep time (TST) of 316.5 minutes. The patient's sleep latency was 0 minutes. REM latency was 99.5 minutes.  The sleep efficiency was 90. %.    SLEEP ARCHITECTURE: Wake after sleep was 32  minutes with mild sleep fragmentation noted. Stage N1 20 minutes, Stage N2 234.5 minutes, Stage N3 18.5 minutes and Stage R (REM sleep) 43.5 minutes. The percentages were: Stage N1 6.3%, Stage N2 74.1%, which is increased, Stage N3 5.8% and Stage R (REM sleep) 13.7%.   The arousals were noted as: 85 were spontaneous, 0 were associated with PLMs, 95 were associated with respiratory events.  RESPIRATORY ANALYSIS:  There were a total of 111 respiratory events: 10 obstructive apneas, 0 central apneas and 0 mixed apneas with a total of 10 apneas and an apnea index (AI) of 1.9. There were 101 hypopneas with a hypopnea index of 19.1 /hour. The patient also had 0 respiratory event related arousals (RERAs).      The total APNEA/HYPOPNEA INDEX  (AHI) was 21. /hour and the total RESPIRATORY DISTURBANCE INDEX was 21. /hour.  0 events occurred in REM sleep and 111 events in NREM. The REM AHI was 0 /hour versus a non-REM AHI of 24.4 /hour. The patient spent 54% of total sleep time in the supine position. The supine AHI was 28.7 /hour, versus a non-supine AHI of 12.0/hour.  OXYGEN SATURATION & C02:  The wake baseline 02 saturation was 0%, with the lowest being 80%. Time spent below 89% saturation equaled 79 minutes.  PERIODIC LIMB MOVEMENTS:    The patient had a total of 0 Periodic Limb Movements. The Periodic Limb Movement (PLM) index was 0 /hour and the PLM Arousal index was 0 /hour. Post-study, the patient indicated that sleep was worse than usual.  POLYSOMNOGRAPHY IMPRESSION :   1. Obstructive Sleep Apnea (OSA)  2. Dysfunctions associated with sleep stages or arousals from sleep  RECOMMENDATIONS:  1. This patient has severe obstructive sleep apnea and responded well on CPAP therapy. I will, therefore, start the patient on home CPAP treatment at a pressure of 17 cm via medium FFM, with heated humidity. The patient will be advised to be fully compliant with PAP therapy to improve sleep related symptoms and  decrease long term cardiovascular risks. Please note that untreated obstructive sleep apnea may carry additional perioperative morbidity. Patients with significant obstructive sleep apnea should receive perioperative PAP therapy and the surgeons and particularly the anesthesiologist should be informed of the diagnosis and the severity of the sleep disordered breathing. 2. This study shows some sleep fragmentation and abnormal sleep stage percentages; these are nonspecific findings and per se do not signify an intrinsic sleep disorder or a cause for the patient's sleep-related symptoms. Causes include (but are not limited to) the first night effect of the sleep study, circadian rhythm disturbances, medication effect or an underlying mood disorder or medical problem.  3. The patient should be cautioned not to drive, work at heights, or operate dangerous or heavy equipment when tired or sleepy. Review and reiteration of good sleep hygiene measures should be pursued with any patient. 4. The patient will be seen in follow-up in the sleep clinic at [facility] for discussion of the test results, symptom and treatment compliance review,  further management strategies, etc. The referring provider will be notified of the test results.  I certify that I have reviewed the entire raw data recording prior to the issuance of this report in accordance with the Standards of Accreditation of the American Academy of Sleep Medicine (AASM)  [name], MD, PhD Diplomat, American Board of Neurology and Sleep Medicine (Neurology and Sleep Medicine)

## 2020-09-10 NOTE — Addendum Note (Signed)
Addended by: [name] on: 09/10/2020 07:37 AM   Modules accepted: Orders

## 2020-09-10 NOTE — Progress Notes (Signed)
Patient presented for reevaluation of his obstructive sleep apnea.  He has been on  CPAP therapy for years, referred by Joycelyn Man, NP.  I saw him on 07/31/2020 when he had a split-night sleep study on 08/29/2020.  Please advise patient that his sleep study confirmed severe obstructive sleep apnea.  He did well on CPAP of 17 cm.  I am not sure what pressure he has been on at home.  I do not believe he has a local DME company.  Please send his CPAP order for a new machine to a DME company of his choice.  He will need a follow-up appointment within 31 to 89 days of starting treatment.  Please remind patient to be fully compliant with treatment.

## 2020-09-18 ENCOUNTER — Telehealth: Payer: Self-pay

## 2020-09-18 NOTE — Telephone Encounter (Signed)
Ok, thank you for the notification.

## 2020-09-18 NOTE — Telephone Encounter (Signed)
Received this message from [employer]  Manson Passey, Leafy Ro, Carolynn Serve, RN [facility]!   So on this pt - he actually is NOT eligible for a new machine at this time. Pt rcvd his last CPAP under the same insurance 01/04/17. Pt not eligible until 01/05/22 for a replacement. Will bring pt in for troubleshooting, but we can't provide a new machine unless pt wants to pay out of pocket.   Just wanted to let you know!      Will fwd to MD as Lorain Childes

## 2020-09-27 ENCOUNTER — Emergency Department (HOSPITAL_COMMUNITY)
Admission: EM | Admit: 2020-09-27 | Discharge: 2020-09-27 | Disposition: A | Discharge status: Home / Self Care | Payer: Medicare Other | Attending: Emergency Medicine | Admitting: Emergency Medicine

## 2020-09-27 ENCOUNTER — Other Ambulatory Visit: Payer: Self-pay

## 2020-09-27 ENCOUNTER — Ambulatory Visit (HOSPITAL_COMMUNITY)
Admission: EM | Admit: 2020-09-27 | Discharge: 2020-09-27 | Disposition: A | Discharge status: Home / Self Care | Payer: Medicare Other | Attending: Emergency Medicine | Admitting: Emergency Medicine

## 2020-09-27 ENCOUNTER — Encounter (HOSPITAL_COMMUNITY): Payer: Self-pay

## 2020-09-27 ENCOUNTER — Emergency Department (HOSPITAL_COMMUNITY): Payer: Medicare Other

## 2020-09-27 DIAGNOSIS — G8929 Other chronic pain: Secondary | ICD-10-CM

## 2020-09-27 DIAGNOSIS — Z5320 Procedure and treatment not carried out because of patient's decision for unspecified reasons: Secondary | ICD-10-CM

## 2020-09-27 DIAGNOSIS — Z9119 Patient's noncompliance with other medical treatment and regimen: Secondary | ICD-10-CM

## 2020-09-27 DIAGNOSIS — M545 Low back pain, unspecified: Secondary | ICD-10-CM | POA: Insufficient documentation

## 2020-09-27 DIAGNOSIS — Z5321 Procedure and treatment not carried out due to patient leaving prior to being seen by health care provider: Secondary | ICD-10-CM | POA: Diagnosis not present
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
## 2020-09-27 NOTE — Discharge Instructions (Addendum)
Continue tylenol. Follow up with pain management next week. If symptoms are severe go to ER as we discussed.
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
## 2020-09-30 ENCOUNTER — Observation Stay (HOSPITAL_COMMUNITY): Payer: Medicare Other

## 2020-09-30 ENCOUNTER — Other Ambulatory Visit: Payer: Self-pay

## 2020-09-30 ENCOUNTER — Encounter (HOSPITAL_COMMUNITY): Payer: Self-pay

## 2020-09-30 ENCOUNTER — Emergency Department (HOSPITAL_COMMUNITY): Payer: Medicare Other

## 2020-09-30 ENCOUNTER — Observation Stay (HOSPITAL_COMMUNITY)
Admission: EM | Admit: 2020-09-30 | Discharge: 2020-10-02 | Disposition: A | Discharge status: Home / Self Care | Payer: Medicare Other | Attending: Family Medicine | Admitting: Family Medicine

## 2020-09-30 DIAGNOSIS — Z95810 Presence of automatic (implantable) cardiac defibrillator: Secondary | ICD-10-CM | POA: Diagnosis not present

## 2020-09-30 DIAGNOSIS — R079 Chest pain, unspecified: Secondary | ICD-10-CM

## 2020-09-30 DIAGNOSIS — Y900 Blood alcohol level of less than 20 mg/100 ml: Secondary | ICD-10-CM | POA: Diagnosis not present

## 2020-09-30 DIAGNOSIS — R0789 Other chest pain: Principal | ICD-10-CM | POA: Insufficient documentation

## 2020-09-30 DIAGNOSIS — Z20822 Contact with and (suspected) exposure to covid-19: Secondary | ICD-10-CM | POA: Insufficient documentation

## 2020-09-30 DIAGNOSIS — Z794 Long term (current) use of insulin: Secondary | ICD-10-CM | POA: Diagnosis not present

## 2020-09-30 DIAGNOSIS — M6281 Muscle weakness (generalized): Secondary | ICD-10-CM | POA: Insufficient documentation

## 2020-09-30 DIAGNOSIS — Z950 Presence of cardiac pacemaker: Secondary | ICD-10-CM

## 2020-09-30 DIAGNOSIS — Z91040 Latex allergy status: Secondary | ICD-10-CM | POA: Insufficient documentation

## 2020-09-30 DIAGNOSIS — F1420 Cocaine dependence, uncomplicated: Secondary | ICD-10-CM | POA: Insufficient documentation

## 2020-09-30 DIAGNOSIS — I2510 Atherosclerotic heart disease of native coronary artery without angina pectoris: Secondary | ICD-10-CM | POA: Insufficient documentation

## 2020-09-30 DIAGNOSIS — I428 Other cardiomyopathies: Secondary | ICD-10-CM

## 2020-09-30 DIAGNOSIS — E119 Type 2 diabetes mellitus without complications: Secondary | ICD-10-CM | POA: Diagnosis not present

## 2020-09-30 DIAGNOSIS — J9601 Acute respiratory failure with hypoxia: Secondary | ICD-10-CM

## 2020-09-30 DIAGNOSIS — Z8616 Personal history of COVID-19: Secondary | ICD-10-CM | POA: Diagnosis not present

## 2020-09-30 DIAGNOSIS — F17210 Nicotine dependence, cigarettes, uncomplicated: Secondary | ICD-10-CM | POA: Diagnosis not present

## 2020-09-30 DIAGNOSIS — I110 Hypertensive heart disease with heart failure: Secondary | ICD-10-CM | POA: Insufficient documentation

## 2020-09-30 DIAGNOSIS — I5022 Chronic systolic (congestive) heart failure: Secondary | ICD-10-CM

## 2020-09-30 DIAGNOSIS — Z79899 Other long term (current) drug therapy: Secondary | ICD-10-CM | POA: Diagnosis not present

## 2020-09-30 LAB — RAPID URINE DRUG SCREEN, HOSP PERFORMED
Amphetamines: NOT DETECTED
Barbiturates: NOT DETECTED
Benzodiazepines: NOT DETECTED
Cocaine: POSITIVE — AB
Opiates: NOT DETECTED
Tetrahydrocannabinol: NOT DETECTED

## 2020-09-30 LAB — BASIC METABOLIC PANEL
Anion gap: 7 (ref 5–15)
BUN: 5 mg/dL — ABNORMAL LOW (ref 6–20)
CO2: 27 mmol/L (ref 22–32)
Calcium: 8.5 mg/dL — ABNORMAL LOW (ref 8.9–10.3)
Chloride: 103 mmol/L (ref 98–111)
Creatinine, Ser: 0.8 mg/dL (ref 0.61–1.24)
GFR, Estimated: >60 mL/min (ref >60)
Glucose, Bld: 191 mg/dL — ABNORMAL HIGH (ref 70–99)
Potassium: 3.7 mmol/L (ref 3.5–5.1)
Sodium: 137 mmol/L (ref 135–145)

## 2020-09-30 LAB — GLUCOSE, CAPILLARY: Glucose-Capillary: 222 mg/dL — ABNORMAL HIGH (ref 70–99)

## 2020-09-30 LAB — SARS CORONAVIRUS 2 (TAT 6-24 HRS): SARS Coronavirus 2: NEGATIVE

## 2020-09-30 LAB — CBC
HCT: 40.5 % (ref 39.0–52.0)
Hemoglobin: 13.3 g/dL (ref 13.0–17.0)
MCH: 29.6 pg (ref 26.0–34.0)
MCHC: 32.8 g/dL (ref 30.0–36.0)
MCV: 90 fL (ref 80.0–100.0)
Platelets: 205 10*3/uL (ref 150–400)
RBC: 4.5 MIL/uL (ref 4.22–5.81)
RDW: 12.9 % (ref 11.5–15.5)
WBC: 8.8 10*3/uL (ref 4.0–10.5)
nRBC: 0 % (ref 0.0–0.2)

## 2020-09-30 LAB — HIV ANTIBODY (ROUTINE TESTING W REFLEX): HIV Screen 4th Generation wRfx: NONREACTIVE

## 2020-09-30 LAB — HEMOGLOBIN A1C
Hgb A1c MFr Bld: 10.2 % — ABNORMAL HIGH (ref 4.8–5.6)
Mean Plasma Glucose: 246.04 mg/dL

## 2020-09-30 LAB — TSH: TSH: 0.412 u[IU]/mL (ref 0.350–4.500)

## 2020-09-30 LAB — CBG MONITORING, ED: Glucose-Capillary: 173 mg/dL — ABNORMAL HIGH (ref 70–99)

## 2020-09-30 LAB — ETHANOL: Alcohol, Ethyl (B): <10 mg/dL (ref <10)

## 2020-09-30 LAB — PHOSPHORUS: Phosphorus: 3.4 mg/dL (ref 2.5–4.6)

## 2020-09-30 LAB — BRAIN NATRIURETIC PEPTIDE: B Natriuretic Peptide: 121.6 pg/mL — ABNORMAL HIGH (ref 0.0–100.0)

## 2020-09-30 LAB — TROPONIN I (HIGH SENSITIVITY)
Troponin I (High Sensitivity): 17 ng/L (ref <18)
Troponin I (High Sensitivity): 15 ng/L (ref <18)

## 2020-09-30 LAB — MAGNESIUM: Magnesium: 1.7 mg/dL (ref 1.7–2.4)

## 2020-09-30 MED ORDER — HYDROCORTISONE NA SUCCINATE PF 250 MG IJ SOLR
200.0000 mg | Freq: Once | INTRAMUSCULAR | Status: AC
  Administered 2020-09-30: 200 mg via INTRAVENOUS
  Filled 2020-09-30: qty 200

## 2020-09-30 MED ORDER — METOPROLOL SUCCINATE ER 100 MG PO TB24
100.0000 mg | ORAL_TABLET | Freq: Every day | ORAL | Status: DC
  Administered 2020-10-01: 100 mg via ORAL
  Filled 2020-09-30: qty 1

## 2020-09-30 MED ORDER — ROSUVASTATIN CALCIUM 20 MG PO TABS
20.0000 mg | ORAL_TABLET | Freq: Every day | ORAL | Status: DC
  Administered 2020-10-01 – 2020-10-02 (×2): 20 mg via ORAL
  Filled 2020-09-30 (×2): qty 1

## 2020-09-30 MED ORDER — THIAMINE HCL 100 MG/ML IJ SOLN
100.0000 mg | Freq: Every day | INTRAMUSCULAR | Status: DC
  Filled 2020-09-30: qty 2

## 2020-09-30 MED ORDER — LORAZEPAM 2 MG/ML IJ SOLN: 1.0000 mg | INTRAMUSCULAR | Status: DC | PRN

## 2020-09-30 MED ORDER — CLOPIDOGREL BISULFATE 75 MG PO TABS
75.0000 mg | ORAL_TABLET | Freq: Every day | ORAL | Status: DC
  Administered 2020-10-01 – 2020-10-02 (×2): 75 mg via ORAL
  Filled 2020-09-30 (×2): qty 1

## 2020-09-30 MED ORDER — LORAZEPAM 1 MG PO TABS: 1.0000 mg | ORAL_TABLET | ORAL | Status: DC | PRN

## 2020-09-30 MED ORDER — ACETAMINOPHEN 325 MG PO TABS
650.0000 mg | ORAL_TABLET | Freq: Once | ORAL | Status: AC
  Administered 2020-09-30: 650 mg via ORAL
  Filled 2020-09-30: qty 2

## 2020-09-30 MED ORDER — THIAMINE HCL 100 MG PO TABS
100.0000 mg | ORAL_TABLET | Freq: Every day | ORAL | Status: DC
  Administered 2020-10-01 – 2020-10-02 (×2): 100 mg via ORAL
  Filled 2020-09-30 (×2): qty 1

## 2020-09-30 MED ORDER — DIPHENHYDRAMINE HCL 50 MG/ML IJ SOLN
50.0000 mg | Freq: Once | INTRAMUSCULAR | Status: AC
  Administered 2020-09-30: 50 mg via INTRAVENOUS
  Filled 2020-09-30: qty 1

## 2020-09-30 MED ORDER — PRAZOSIN HCL 2 MG PO CAPS
2.0000 mg | ORAL_CAPSULE | Freq: Every day | ORAL | Status: DC
  Administered 2020-09-30 – 2020-10-01 (×2): 2 mg via ORAL
  Filled 2020-09-30 (×4): qty 1

## 2020-09-30 MED ORDER — INSULIN ASPART 100 UNIT/ML ~~LOC~~ SOLN: 0.0000 [IU] | Freq: Three times a day (TID) | SUBCUTANEOUS | Status: DC

## 2020-09-30 MED ORDER — FOLIC ACID 1 MG PO TABS
1.0000 mg | ORAL_TABLET | Freq: Every day | ORAL | Status: DC
  Administered 2020-10-01 – 2020-10-02 (×2): 1 mg via ORAL
  Filled 2020-09-30 (×3): qty 1

## 2020-09-30 MED ORDER — HEPARIN SODIUM (PORCINE) 5000 UNIT/ML IJ SOLN
5000.0000 [IU] | Freq: Three times a day (TID) | INTRAMUSCULAR | Status: DC
  Administered 2020-09-30 – 2020-10-02 (×6): 5000 [IU] via SUBCUTANEOUS
  Filled 2020-09-30 (×6): qty 1

## 2020-09-30 MED ORDER — DICLOFENAC SODIUM 1 % EX GEL
2.0000 g | Freq: Four times a day (QID) | CUTANEOUS | Status: DC
  Administered 2020-09-30 – 2020-10-01 (×3): 2 g via TOPICAL
  Filled 2020-09-30: qty 100

## 2020-09-30 MED ORDER — IOHEXOL 350 MG/ML SOLN
100.0000 mL | Freq: Once | INTRAVENOUS | Status: AC | PRN
  Administered 2020-09-30: 100 mL via INTRAVENOUS

## 2020-09-30 MED ORDER — INSULIN ASPART 100 UNIT/ML ~~LOC~~ SOLN
0.0000 [IU] | Freq: Every day | SUBCUTANEOUS | Status: DC
  Administered 2020-09-30: 2 [IU] via SUBCUTANEOUS

## 2020-09-30 MED ORDER — DIPHENHYDRAMINE HCL 25 MG PO CAPS: 50.0000 mg | ORAL_CAPSULE | Freq: Once | ORAL | Status: AC

## 2020-09-30 MED ORDER — FUROSEMIDE 80 MG PO TABS
80.0000 mg | ORAL_TABLET | Freq: Every day | ORAL | Status: DC
  Administered 2020-10-01 – 2020-10-02 (×2): 80 mg via ORAL
  Filled 2020-09-30 (×2): qty 1

## 2020-09-30 MED ORDER — TAMSULOSIN HCL 0.4 MG PO CAPS
0.4000 mg | ORAL_CAPSULE | Freq: Every day | ORAL | Status: DC
  Administered 2020-10-01: 0.4 mg via ORAL
  Filled 2020-09-30 (×2): qty 1

## 2020-09-30 MED ORDER — INSULIN GLARGINE 100 UNIT/ML ~~LOC~~ SOLN
30.0000 [IU] | Freq: Every day | SUBCUTANEOUS | Status: DC
  Administered 2020-09-30 – 2020-10-01 (×2): 30 [IU] via SUBCUTANEOUS
  Filled 2020-09-30 (×4): qty 0.3

## 2020-09-30 MED ORDER — PANTOPRAZOLE SODIUM 40 MG PO TBEC
40.0000 mg | DELAYED_RELEASE_TABLET | Freq: Two times a day (BID) | ORAL | Status: DC
  Administered 2020-09-30 – 2020-10-02 (×4): 40 mg via ORAL
  Filled 2020-09-30 (×4): qty 1

## 2020-09-30 MED ORDER — INSULIN ASPART 100 UNIT/ML ~~LOC~~ SOLN
6.0000 [IU] | Freq: Three times a day (TID) | SUBCUTANEOUS | Status: DC
  Administered 2020-10-01 – 2020-10-02 (×5): 6 [IU] via SUBCUTANEOUS

## 2020-09-30 MED ORDER — ACETAMINOPHEN 325 MG PO TABS: 650.0000 mg | ORAL_TABLET | Freq: Four times a day (QID) | ORAL | Status: DC | PRN

## 2020-09-30 MED ORDER — SERTRALINE HCL 50 MG PO TABS
150.0000 mg | ORAL_TABLET | Freq: Every day | ORAL | Status: DC
  Administered 2020-10-01 – 2020-10-02 (×2): 150 mg via ORAL
  Filled 2020-09-30 (×2): qty 1

## 2020-09-30 MED ORDER — POLYETHYLENE GLYCOL 3350 17 G PO PACK: 17.0000 g | PACK | Freq: Every day | ORAL | Status: DC | PRN

## 2020-09-30 MED ORDER — ACETAMINOPHEN 500 MG PO TABS
1000.0000 mg | ORAL_TABLET | Freq: Once | ORAL | Status: AC
  Administered 2020-09-30: 1000 mg via ORAL
  Filled 2020-09-30: qty 2

## 2020-09-30 MED ORDER — DIVALPROEX SODIUM 250 MG PO DR TAB
500.0000 mg | DELAYED_RELEASE_TABLET | Freq: Two times a day (BID) | ORAL | Status: DC
  Administered 2020-09-30 – 2020-10-02 (×4): 500 mg via ORAL
  Filled 2020-09-30 (×4): qty 2

## 2020-09-30 NOTE — ED Provider Notes (Signed)
[facility] EMERGENCY DEPARTMENT Provider Note   CSN: [id number] Arrival date & time: 09/30/20  1111     History Chief Complaint  Patient presents with  . Chest Pain    KASHON KRAYNAK is a 50 y.o. male.  HPI Patient presents with chest pain.  Mid chest.  States it also goes down the back.  Also low back pain.  Reviewing records has been complaining of low back pain recently.  Does have history of coronary artery disease and states he has had 2 heart attacks.  Denies cocaine use recently.  Does have a history of CHF and has an ICD in place.  States he is felt the ICD "buzzing".  Has not felt it go off.  Currently paced on the AICD.  Has had vomiting twice.  Patient does have an IV dye allergy.    Past Medical History:  Diagnosis Date  . Anxiety   . Biventricular ICD (implantable cardioverter-defibrillator) in place   . Chronic systolic CHF (congestive heart failure) (HCC)   . Cocaine use   . Diabetes mellitus without complication (HCC)   . GERD (gastroesophageal reflux disease)   . HLD (hyperlipidemia)   . Hypertension   . Morbid obesity (HCC)   . NICM (nonischemic cardiomyopathy) (HCC)   . OSA (obstructive sleep apnea)   . PTSD (post-traumatic stress disorder)    on Depakote  . Refusal of blood transfusions as patient is Jehovah's Witness   . Tobacco abuse     Patient Active Problem List   Diagnosis Date Noted  . Leukocytosis 07/26/2019  . Nonischemic cardiomyopathy (HCC)   . Acute respiratory failure with hypoxia (HCC) 05/05/2019  . Rhinovirus infection 05/05/2019  . Morbid obesity (HCC) 05/05/2019  . Suspected COVID-19 virus infection 05/05/2019  . Acute intractable headache   . Cocaine abuse (HCC)   . Acute non intractable tension-type headache   . Blurred vision, bilateral   . Chronic systolic CHF (congestive heart failure) (HCC) 01/17/2019  . Pacemaker   . HLD (hyperlipidemia)   . Diabetes mellitus without complication (HCC)   .  Hypertension   . GERD (gastroesophageal reflux disease)   . Chest pain   . Tobacco abuse   . Alcohol abuse     Past Surgical History:  Procedure Laterality Date  . BIV ICD INSERTION CRT-D    . FOOT FRACTURE SURGERY    . ROTATOR CUFF REPAIR    . TONSILLECTOMY         Family History  Problem Relation Age of Onset  . Hypertension Mother   . Bone cancer Father   . Lung cancer Father     Social History   Tobacco Use  . Smoking status: Current Every Day Smoker    Types: Cigarettes  . Smokeless tobacco: Never Used  . Tobacco comment: occasionally  Vaping Use  . Vaping Use: Never used  Substance Use Topics  . Alcohol use: Not Currently  . Drug use: Not Currently    Types: Cocaine    Home Medications Prior to Admission medications   Medication Sig Start Date End Date Taking? Authorizing Provider  clopidogrel (PLAVIX) 75 MG tablet Take 75 mg by mouth daily. 01/13/19   [provider]  divalproex (DEPAKOTE) 500 MG DR tablet Take 500 mg by mouth 2 (two) times a day. 06/30/18   [provider]  furosemide (LASIX) 80 MG tablet Take 80 mg by mouth daily. 09/13/18   [provider]  HUMALOG MIX 75/25 Stephanie Coup (  75-25) 100 UNIT/ML Kwikpen Inject 60 Units into the skin 3 (three) times daily before meals.  01/13/19   [provider]  isosorbide mononitrate (IMDUR) 30 MG 24 hr tablet Take 1 tablet (30 mg total) by mouth daily. 07/28/19   Black, Lesle Chris, NP  meclizine (ANTIVERT) 25 MG tablet Take 1 tablet (25 mg total) by mouth 3 (three) times daily as needed for dizziness. 06/26/20   Alvira Monday, MD  metoprolol succinate (TOPROL-XL) 100 MG 24 hr tablet Take 100 mg by mouth daily. Take with or immediately following a meal.    [provider]  ondansetron (ZOFRAN ODT) 4 MG disintegrating tablet Take 1 tablet (4 mg total) by mouth every 8 (eight) hours as needed for nausea or vomiting. 06/26/20   Alvira Monday, MD  oxyCODONE-acetaminophen  (PERCOCET/ROXICET) 5-325 MG tablet Take 1-2 tablets by mouth every 6 (six) hours as needed for severe pain. 07/27/20   Gwyneth Sprout, MD  pantoprazole (PROTONIX) 40 MG tablet Take 1 tablet (40 mg total) by mouth 2 (two) times daily. 07/27/19   Joseph Art, DO  prazosin (MINIPRESS) 2 MG capsule Take 2 mg by mouth at bedtime. 10/28/18   [provider]  rosuvastatin (CRESTOR) 20 MG tablet Take 20 mg by mouth daily. 09/21/18   [provider]  sertraline (ZOLOFT) 50 MG tablet Take 150 mg by mouth daily.  10/28/18   [provider]  tamsulosin (FLOMAX) 0.4 MG CAPS capsule Take 1 capsule (0.4 mg total) by mouth daily after supper. 07/27/20   Gwyneth Sprout, MD  traZODone (DESYREL) 100 MG tablet Take 200 mg by mouth at bedtime.  12/19/18   [provider]    Allergies    Aspirin, Iodine-131, Iodinated diagnostic agents, Latex, Tramadol, Penicillins, Amoxicillin-pot clavulanate, Lidocaine, and Lisinopril  Review of Systems   Review of Systems  Constitutional: Negative for appetite change.  HENT: Negative for congestion.   Respiratory: Negative for shortness of breath.   Cardiovascular: Positive for chest pain.  Gastrointestinal: Positive for vomiting.  Genitourinary: Negative for flank pain.  Musculoskeletal: Positive for back pain.  Skin: Negative for rash.  Neurological: Negative for weakness.  Psychiatric/Behavioral: Negative for confusion.    Physical Exam Updated Vital Signs BP (!) 177/94   Pulse 68   Temp 98.3 F (36.8 C) (Oral)   Resp 12   Ht 5\' 5"  (1.651 m)   Wt 121.1 kg   SpO2 98%   BMI 44.43 kg/m   Physical Exam Vitals reviewed.  HENT:     Head: Atraumatic.  Cardiovascular:     Rate and Rhythm: Regular rhythm.     Heart sounds: No murmur heard.   Pulmonary:     Breath sounds: No wheezing or rhonchi.  Chest:     Chest wall: No tenderness.  Abdominal:     Tenderness: There is no abdominal tenderness.  Neurological:      Mental Status: He is alert.     ED Results / Procedures / Treatments   Labs (all labs ordered are listed, but only abnormal results are displayed) Labs Reviewed  BASIC METABOLIC PANEL - Abnormal; Notable for the following components:      Result Value   Glucose, Bld 191 (*)    BUN 5 (*)    Calcium 8.5 (*)    All other components within normal limits  CBG MONITORING, ED - Abnormal; Notable for the following components:   Glucose-Capillary 173 (*)    All other components within normal limits  SARS CORONAVIRUS 2 (TAT 6-24 HRS)  CBC  RAPID URINE DRUG SCREEN, HOSP PERFORMED  ETHANOL  MAGNESIUM  PHOSPHORUS  TROPONIN I (HIGH SENSITIVITY)  TROPONIN I (HIGH SENSITIVITY)    EKG EKG Interpretation  Date/Time:  Monday September 30 2020 11:24:12 EDT Ventricular Rate:  74 PR Interval:  169 QRS Duration: 155 QT Interval:  463 QTC Calculation: 514 R Axis:   77 Text Interpretation: Atrial-sensed ventricular-paced rhythm Left bundle branch block Confirmed by Benjiman Core [phone]) on 09/30/2020 11:25:57 AM   Radiology DG Chest Portable 1 View  Result Date: 09/30/2020 CLINICAL DATA:  Chest pain 04/05/2009 throbbing midsternal chest pain radiating to neck and down RIGHT arm EXAM: PORTABLE CHEST 1 VIEW COMPARISON:  December of 2021 FINDINGS: LEFT-sided 3 lead pacer defibrillator in place as before. Trachea midline. Cardiac enlargement as before. Lungs are clear. Limited skeletal assessment is unremarkable. IMPRESSION: No active disease. Electronically Signed   By: Donzetta Kohut M.D.   On: 09/30/2020 12:42    Procedures Procedures   Medications Ordered in ED Medications  hydrocortisone sodium succinate (SOLU-CORTEF) injection 200 mg (has no administration in time range)  diphenhydrAMINE (BENADRYL) capsule 50 mg (has no administration in time range)    Or  diphenhydrAMINE (BENADRYL) injection 50 mg (has no administration in time range)  LORazepam (ATIVAN) tablet 1-4 mg (has no  administration in time range)    Or  LORazepam (ATIVAN) injection 1-4 mg (has no administration in time range)  thiamine tablet 100 mg (has no administration in time range)    Or  thiamine (B-1) injection 100 mg (has no administration in time range)  folic acid (FOLVITE) tablet 1 mg (has no administration in time range)  acetaminophen (TYLENOL) tablet 650 mg (650 mg Oral Given 09/30/20 1345)    ED Course  I have reviewed the triage vital signs and the nursing notes.  Pertinent labs & imaging results that were available during my care of the patient were reviewed by me and considered in my medical decision making (see chart for details).    MDM Rules/Calculators/A&P                          Patient presents with chest pain.  Upper chest going to the back.  States this is a new pain for him.  EKG is paced.  Has known heart failure states he has had previous MI.  Also complaining of low back pain.  Has had some nausea and vomiting.  I think with pain in the upper chest going down to the back needs to be ruled out for aortic dissection however has a contrast allergy.  States he has had to get Benadryl in the past.  However I think patient benefit from a pretreatment with steroids also.  First troponin is negative.  Chest x-ray is reassuring.  Will admit to unassigned medicine for further work-up of chest pain.  Low back pain potentially is more chronic pain.  Previous cocaine use but denies recent use. Final Clinical Impression(s) / ED Diagnoses Final diagnoses:  Nonspecific chest pain    Rx / DC Orders ED Discharge Orders    None       Benjiman Core, MD 09/30/20 1536

## 2020-09-30 NOTE — Progress Notes (Signed)
FPTS Interim Progress Note  S: Received page regarding patient complaining of 10/10 back pain. Went to bedside to evaluate along with Dr. Constance Goltz, patient is asleep comfortably when we walk into the room.   O: BP 122/63 (BP Location: Right Arm)   Pulse 63   Temp 98.5 F (36.9 C) (Oral)   Resp 16   Ht 5' 5.5" (1.664 m)   Wt 122.9 kg Comment: scale C  SpO2 94%   BMI 44.40 kg/m   General: Patient sleeping comfortably, in no acute distress. CV: RRR, no murmurs or gallops auscultated  Resp: CTAB, no rales or rhonchi noted, good air movement throughout all lung fields  MSK: tenderness along lower back/sacral area along L4-L5 region, no rash or erythema noted Neuro: awake and alert to touch Psych: mood appropriate   A/P: -patient comfortably and able to sleep through pain although complaining of 10/10 pain. No red flag symptoms including saddle paresthesia and previous back surgeries -likely MSK related, possible from being recently bed bound since admission, voltaren gel and tylenol 1000 mg ordered. Already K pad ordered. Patient allergic to tramadol.  -conveyed this to nurse -remainder of plan as previously discussed  Reece Leader, DO 09/30/2020, 9:16 PM PGY-1, [facility] Family Medicine Service pager [phone]

## 2020-09-30 NOTE — ED Notes (Signed)
pt states he has not ETOH related issues at this time.  Declined medications r/t to this.  [facility] notified and is ok with this. States they will leave order for thiamine and folic acid until tomorrow to ensure he is ok and meds not needed.

## 2020-09-30 NOTE — Plan of Care (Signed)

## 2020-09-30 NOTE — H&P (Addendum)
Family Medicine Teaching [facility] Admission History and Physical Service Pager: [phone]  Patient name: Luis [name] Medical record number: [id number] Date of birth: 1970/09/23 Age: 50 y.o. Gender: male  Primary Care Provider: Margot Ables, MD  Consultants: None Code Status: Full Code Preferred Emergency Contact: Kasandra Knudsen (fiancee) [phone] - patient requests not calling her unless he is unable to speak for himself  Chief Complaint: Chest pain  Assessment and Plan: Luis [name] is a 50 y.o. male presenting with chest pain. PMH is significant for IDDM, HTN, CAD s/p biventricular ICD, cocaine abuse, HFrEF, major depression, anxiety, GERD, sleep apnea, h/o COVID Dec 2021.   Chest pain  ACS rule out  Dissection rule out Patient has been experiencing several days of increased fatigue and increased swelling of ankles despite lasix adherence. Then starting at 7:30am today, was awoken by chest pain that radiated to his back and down his right arm. Also shortness of breath, headache, photophobia, and acute on chronic back pain. Vital signs stable on admission, patient afebrile and SpO2 normal on room air. CBC unremarkable. BMP demonstrates glucose in 180s, calcium 8.5, and BUN 5. EKG shows atrially paced rhythm at regular rate with moderately widened QRS, LBBB without ST segment elevation. Troponins negative x2. Patient admitted for ACS rule out and observation given significant cardiac history. Differential includes N/STEMI, aortic dissection, CHF exacerbation, cocaine use, and severe acid reflux. NSTEMI ruled out with negative troponins. Aortic dissection a possibility given history and symptoms, currently awaiting STAT CTA chest/abdomen to evaluate. CHF exacerbation less likely but may be contributory given increased fatigue, ankle swelling, and shortness of breath; awaiting ECHO and BNP. Lastly, cocaine use certainly a possibility given acute onset chest pain  without clear cardiac etiology and significant history of cocaine and alcohol abuse. Acid reflux a consideration given morbid obesity, history of GERD (although on protonix) and chest pain symptoms.  - admit to inpatient with Dr. Miquel Dunn attending - ECHO, BNP - CTA abdomen chest for dissection r/o (premedication for contrast allergy) - Mag, phos - UDS, blood ETOH level - vitals per unit routine - continuous cardiac monitoring - NPO until studies normal - Will consult cardiology in the morning to interrogate pacemaker and ensure proper functioning  Tobacco abuse  Alcohol abuse  Cocaine abuse Patient told me he has "been clean and sober for 11 years". Says he "doesn't like the way alcohol makes me feel with the diabetes." However, UDS positive for cocaine during ED visit in March 2021. And a previous note from [facility] in April 2021 reports alcohol abuse at rate of "a 5th a day for the last 3 weeks...just came off 12 day binge."  - ethanol levels - UDS - CIWA and COWS q4 - patient declines nicotine patch at time of admission - thiamine and folate ordered  Chronic systolic CHF  Nonischemic cardiomyopathy  CAD  Biventricular ICD Last ECHO 07/26/2019, demonstrates LVEF 35-40%, grade II diastolic dysfunction with pseudonormalization, and LV global hypokinesis. S/p biventricular ICD generator change on July 15, 2018 at [facility].  - Home meds: plavix 75 mg daily, imdur 24 hour tab 30 mg daily - will continue inpatient - f/u ECHO - BNP - heart healthy diet once studies come back normal (CTA and ECHO)  Insulin dependent diabetes mellitus Most recent A1c of 9.4 on 07/26/2019. Home meds include Humalog 75/25 60 units TID and Victoza 1.8 mg daily (weekly per record from old PCP, daily per patient) - Lantus 30 units daily -  moderate SSI with meals and qHS coverage - no victoza at this time  Penile Complaints At admission, patient reports a "tingling sensation at the head  of my penis sometimes" and requests STD testing. Denies discharge, rash, or lesion. No recent fever or chills. Does not admit to specific exposure concerns, just says "if I get my girl infected I'll be in trouble".  No lesions, erythema, or drainage noted on physical exam. - blood sample for HIV and RPR - urine sample for gonorrhea and chlamydia  Chronic back pain - home meds: Percocet 5-325mg  1-2 tablets q6 PRN (patient reports, however per PDMP patient will received oxycodone-acetaminophen, 6 count on 07/27/2020 and hydrocodone-acetaminophen 15 count on 07/30/2020) - decline to restart at this time - offer ice or heat PRN -Acetaminophen -PT/OT  GERD - home meds: protonix 40 mg daily - will continue inpatient  Hyperlipidemia - home med rosuvastatin 20 mg daily - will continue inpatient  Hypertension - Home meds: metoprolol succ 100 mg daily - will continue inpatient  Depression  Anxiety  PTSD  Nightmares - Home meds: depakote 500 mg BID, prazosin 4 mg qHS, sertraline 150 mg daily, trazodone 200 mg qHS - will continue depakote, prazosin, sertraline - hold trazodone for now  Obstructive Sleep Apnea - will offer CPAP nightly  BPH - home med: tamsulosin 0.4 mg daily - will continue inpatient  Dizziness - home med: meclizine 25 mg TID prn, zofran ODT 4mg  q8 prn - will continue while inaptient    FEN/GI: NPO Prophylaxis: Heparin subcu until CTA negative for dissection, then plan for lovenox  Disposition: Med telemetry  History of Present Illness:  Luis [name] is a 50 y.o. male presenting with chest pain radiating into back and down right arm. Arrived via EMS, received 0.4 mg nitro without relief of chest pain. Describes chest pain as throbbing, locates to middle of chest with palm of hand. Also having shortness of breath and nausea with a couple episodes of emesis. Has ICD, feels a little tingly "like when you stick your tongue on a 9 volt battery" and says it feels  electrical like his ICD is causing it (although he knows it is very different from defibrillation). Started suddenly at 7:30am this morning. No prodrome. Only other symptom lately is increased fatigue (sleeping more often, longer) and 3 days of increased swelling of his ankles despite adherence to daily lasix dosing. Reports adherence to his medications without any missed doses. No new meds, no med changes. Also some acute on chronic back pain, was seen by urgent care several days ago for this.    Review Of Systems: Per HPI with the following additions:   Review of Systems  Constitutional: Negative for chills, diaphoresis and fever.  HENT: Negative for congestion.   Respiratory: Positive for shortness of breath. Negative for cough.   Cardiovascular: Positive for chest pain, palpitations and leg swelling (x2 days).  Gastrointestinal: Positive for nausea and vomiting. Negative for blood in stool, constipation and diarrhea.  Genitourinary: Positive for difficulty urinating.     Patient Active Problem List   Diagnosis Date Noted  . Leukocytosis 07/26/2019  . Nonischemic cardiomyopathy (HCC)   . Acute respiratory failure with hypoxia (HCC) 05/05/2019  . Rhinovirus infection 05/05/2019  . Morbid obesity (HCC) 05/05/2019  . Suspected COVID-19 virus infection 05/05/2019  . Acute intractable headache   . Cocaine abuse (HCC)   . Acute non intractable tension-type headache   . Blurred vision, bilateral   . Chronic systolic CHF (  congestive heart failure) (HCC) 01/17/2019  . Pacemaker   . HLD (hyperlipidemia)   . Diabetes mellitus without complication (HCC)   . Hypertension   . GERD (gastroesophageal reflux disease)   . Nonspecific chest pain   . Tobacco abuse   . Alcohol abuse     Past Medical History: Past Medical History:  Diagnosis Date  . Anxiety   . Biventricular ICD (implantable cardioverter-defibrillator) in place   . Chronic systolic CHF (congestive heart failure) (HCC)   .  Cocaine use   . Diabetes mellitus without complication (HCC)   . GERD (gastroesophageal reflux disease)   . HLD (hyperlipidemia)   . Hypertension   . Morbid obesity (HCC)   . NICM (nonischemic cardiomyopathy) (HCC)   . OSA (obstructive sleep apnea)   . PTSD (post-traumatic stress disorder)    on Depakote  . Refusal of blood transfusions as patient is Jehovah's Witness   . Tobacco abuse     Past Surgical History: Past Surgical History:  Procedure Laterality Date  . BIV ICD INSERTION CRT-D    . FOOT FRACTURE SURGERY    . ROTATOR CUFF REPAIR    . TONSILLECTOMY      Social History: Social History   Tobacco Use  . Smoking status: Current Every Day Smoker    Types: Cigarettes  . Smokeless tobacco: Never Used  . Tobacco comment: occasionally  Vaping Use  . Vaping Use: Never used  Substance Use Topics  . Alcohol use: Not Currently  . Drug use: Not Currently    Types: Cocaine   Additional social history: None  Please also refer to relevant sections of EMR.  Family History: Family History  Problem Relation Age of Onset  . Hypertension Mother   . Bone cancer Father   . Lung cancer Father     Allergies and Medications: Allergies  Allergen Reactions  . Aspirin Anaphylaxis    Throat closing   . Iodine-131 Hives    Hives  . Iodinated Diagnostic Agents Hives  . Latex Rash and Hives  . Tramadol Hives and Nausea Only  . Penicillins Nausea And Vomiting  . Amoxicillin-Pot Clavulanate Diarrhea  . Lidocaine Rash  . Lisinopril Cough   No current facility-administered medications on file prior to encounter.   Current Outpatient Medications on File Prior to Encounter  Medication Sig Dispense Refill  . clopidogrel (PLAVIX) 75 MG tablet Take 75 mg by mouth daily.    . clotrimazole-betamethasone (LOTRISONE) cream Apply 1 a small amount to affected area twice a day    . divalproex (DEPAKOTE) 500 MG DR tablet Take 500 mg by mouth 2 (two) times a day.    . furosemide (LASIX)  80 MG tablet Take 80 mg by mouth daily.    Marland Kitchen HUMALOG MIX 75/25 KWIKPEN (75-25) 100 UNIT/ML Kwikpen Inject 60 Units into the skin 3 (three) times daily before meals.     . meclizine (ANTIVERT) 25 MG tablet Take 1 tablet (25 mg total) by mouth 3 (three) times daily as needed for dizziness. 30 tablet 0  . metoprolol succinate (TOPROL-XL) 100 MG 24 hr tablet Take 100 mg by mouth daily. Take with or immediately following a meal.    . ondansetron (ZOFRAN ODT) 4 MG disintegrating tablet Take 1 tablet (4 mg total) by mouth every 8 (eight) hours as needed for nausea or vomiting. 20 tablet 0  . pantoprazole (PROTONIX) 40 MG tablet Take 1 tablet (40 mg total) by mouth 2 (two) times daily. 60 tablet 0  .  prazosin (MINIPRESS) 2 MG capsule Take 2 mg by mouth at bedtime.    . rosuvastatin (CRESTOR) 20 MG tablet Take 20 mg by mouth daily.    . sertraline (ZOLOFT) 50 MG tablet Take 150 mg by mouth daily.     . tamsulosin (FLOMAX) 0.4 MG CAPS capsule Take 1 capsule (0.4 mg total) by mouth daily after supper. 14 capsule 0  . traZODone (DESYREL) 100 MG tablet Take 200 mg by mouth at bedtime.     Marland Kitchen VICTOZA 18 MG/3ML SOPN Inject 1.8 mg into the skin daily.    . fluconazole (DIFLUCAN) 150 MG tablet TAKE 1 TABLET BY MOUTH EVERY 72 HOURS FOR YEAST FOR 3 DOSES (Patient not taking: Reported on 09/30/2020)    . isosorbide mononitrate (IMDUR) 30 MG 24 hr tablet Take 1 tablet (30 mg total) by mouth daily. (Patient not taking: Reported on 09/30/2020) 30 tablet 0  . oxyCODONE-acetaminophen (PERCOCET/ROXICET) 5-325 MG tablet Take 1-2 tablets by mouth every 6 (six) hours as needed for severe pain. (Patient not taking: No sig reported) 6 tablet 0    Objective: BP (!) 145/67   Pulse (!) 59   Temp 98.3 F (36.8 C)   Resp 16   Ht 5\' 5"  (1.651 m)   Wt 121.1 kg   SpO2 96%   BMI 44.43 kg/m  Exam: General: awake, alert, sitting at side of bed, no acute distress Eyes: EOM intact, pupils equal Cardiovascular: RRR, no murmur  appreciated, trace pitting edema in BLE to mid-shin Respiratory: CTAB MSK: moving all extremities spontaneously, TTP over midline and right lateral  Neuro: cranial nerves II-X grossly intact Psych: appears to have normal insight and judgement  Labs and Imaging: CBC BMET  Recent Labs  Lab 09/30/20 1142  WBC 8.8  HGB 13.3  HCT 40.5  PLT 205   Recent Labs  Lab 09/30/20 1142  NA 137  K 3.7  CL 103  CO2 27  BUN 5*  CREATININE 0.80  GLUCOSE 191*  CALCIUM 8.5*     EKG: atrially paced rhythm, regular rate, LBBB, no ST elevation, normal axis   PORTABLE CHEST 1 VIEW 09/30/2020 COMPARISON:  December of 2021 FINDINGS: LEFT-sided 3 lead pacer defibrillator in place as before. Trachea midline. Cardiac enlargement as before. Lungs are clear. Limited skeletal assessment is unremarkable. IMPRESSION: No active disease    05-16-2000, MD 09/30/2020, 5:48 PM PGY-1, [facility] Family Medicine FPTS Intern pager: [phone], text pages welcome  Upper Level Addendum:  I have seen and evaluated this patient along with Dr. [phone] and reviewed the above note, making necessary revisions as appropriate.  I agree with the medical decision making and physical exam as noted above.  Larita Fife, DO

## 2020-09-30 NOTE — ED Triage Notes (Addendum)
Pt arrived via [facility] from home for 10/10 throbbing mid sternal chest pain that radiates in posterior neck and down right arm. Pt c/o SOB and N/V. Pt states he vomited twice. This started at 0730 today. PT states it woke him up out of his sleep. Pt states he feels his ICD going off at this moment. EMS gave nitro 0.4 mg w/o relief. Pt is NSR w/ST depression, V-paced on monitor. VSS. Pt has non labored breathing. Skin color WNL.

## 2020-09-30 NOTE — ED Notes (Signed)
Report attempted x1.  Will call back in 5 min per Unit RN request

## 2020-09-30 NOTE — ED Notes (Addendum)
CT notified that solu-cortef was administered at 1732.  PT to receive CT 4 hrs later at 2142.  See held order for Diphenhydramine closer to this time.

## 2020-10-01 ENCOUNTER — Observation Stay (HOSPITAL_BASED_OUTPATIENT_CLINIC_OR_DEPARTMENT_OTHER): Payer: Medicare Other

## 2020-10-01 ENCOUNTER — Other Ambulatory Visit (HOSPITAL_COMMUNITY): Payer: Self-pay

## 2020-10-01 DIAGNOSIS — I428 Other cardiomyopathies: Secondary | ICD-10-CM

## 2020-10-01 DIAGNOSIS — F1410 Cocaine abuse, uncomplicated: Secondary | ICD-10-CM

## 2020-10-01 DIAGNOSIS — R079 Chest pain, unspecified: Secondary | ICD-10-CM | POA: Diagnosis not present

## 2020-10-01 DIAGNOSIS — J9601 Acute respiratory failure with hypoxia: Secondary | ICD-10-CM | POA: Diagnosis not present

## 2020-10-01 DIAGNOSIS — R0789 Other chest pain: Secondary | ICD-10-CM | POA: Diagnosis not present

## 2020-10-01 DIAGNOSIS — I5022 Chronic systolic (congestive) heart failure: Secondary | ICD-10-CM

## 2020-10-01 DIAGNOSIS — Z950 Presence of cardiac pacemaker: Secondary | ICD-10-CM

## 2020-10-01 LAB — MOLECULAR ANCILLARY ONLY
Chlamydia: NEGATIVE
Comment: NEGATIVE
Comment: NORMAL
Neisseria Gonorrhea: NEGATIVE

## 2020-10-01 LAB — GLUCOSE, CAPILLARY
Glucose-Capillary: 283 mg/dL — ABNORMAL HIGH (ref 70–99)
Glucose-Capillary: 147 mg/dL — ABNORMAL HIGH (ref 70–99)
Glucose-Capillary: 144 mg/dL — ABNORMAL HIGH (ref 70–99)
Glucose-Capillary: 168 mg/dL — ABNORMAL HIGH (ref 70–99)

## 2020-10-01 LAB — ECHOCARDIOGRAM COMPLETE
AR max vel: 1.2 cm2
AV Area VTI: 1.29 cm2
AV Area mean vel: 1.26 cm2
AV Mean grad: 8 mmHg
AV Peak grad: 14.1 mmHg
Ao pk vel: 1.88 m/s
Area-P 1/2: 3.48 cm2
Calc EF: 21.9 %
Height: 65.5 in
MV M vel: 4.27 m/s
MV Peak grad: 72.9 mmHg
MV VTI: 1.26 cm2
S' Lateral: 4.8 cm
Single Plane A2C EF: 31.9 %
Single Plane A4C EF: 18.6 %
Weight: 4335.13 oz

## 2020-10-01 LAB — COMPREHENSIVE METABOLIC PANEL
ALT: 17 U/L (ref 0–44)
AST: 17 U/L (ref 15–41)
Albumin: 3 g/dL — ABNORMAL LOW (ref 3.5–5.0)
Alkaline Phosphatase: 37 U/L — ABNORMAL LOW (ref 38–126)
Anion gap: 9 (ref 5–15)
BUN: 7 mg/dL (ref 6–20)
CO2: 25 mmol/L (ref 22–32)
Calcium: 8.7 mg/dL — ABNORMAL LOW (ref 8.9–10.3)
Chloride: 100 mmol/L (ref 98–111)
Creatinine, Ser: 0.98 mg/dL (ref 0.61–1.24)
GFR, Estimated: >60 mL/min (ref >60)
Glucose, Bld: 309 mg/dL — ABNORMAL HIGH (ref 70–99)
Potassium: 4.1 mmol/L (ref 3.5–5.1)
Sodium: 134 mmol/L — ABNORMAL LOW (ref 135–145)
Total Bilirubin: 0.6 mg/dL (ref 0.3–1.2)
Total Protein: 5.6 g/dL — ABNORMAL LOW (ref 6.5–8.1)

## 2020-10-01 LAB — RPR: RPR Ser Ql: NONREACTIVE

## 2020-10-01 LAB — CBC
HCT: 42 % (ref 39.0–52.0)
Hemoglobin: 13.9 g/dL (ref 13.0–17.0)
MCH: 29.2 pg (ref 26.0–34.0)
MCHC: 33.1 g/dL (ref 30.0–36.0)
MCV: 88.2 fL (ref 80.0–100.0)
Platelets: 216 10*3/uL (ref 150–400)
RBC: 4.76 MIL/uL (ref 4.22–5.81)
RDW: 12.8 % (ref 11.5–15.5)
WBC: 10.4 10*3/uL (ref 4.0–10.5)
nRBC: 0 % (ref 0.0–0.2)

## 2020-10-01 MED ORDER — TRAZODONE HCL 100 MG PO TABS
200.0000 mg | ORAL_TABLET | Freq: Every day | ORAL | Status: DC
  Administered 2020-10-01: 200 mg via ORAL
  Filled 2020-10-01: qty 2

## 2020-10-01 MED ORDER — CARVEDILOL 6.25 MG PO TABS
6.2500 mg | ORAL_TABLET | Freq: Two times a day (BID) | ORAL | 1 refills | Status: DC
  Filled 2020-10-01: qty 60, 30d supply, fill #0

## 2020-10-01 MED ORDER — GABAPENTIN 100 MG PO CAPS
100.0000 mg | ORAL_CAPSULE | Freq: Once | ORAL | Status: AC
  Administered 2020-10-01: 100 mg via ORAL
  Filled 2020-10-01: qty 1

## 2020-10-01 MED ORDER — INSULIN ASPART 100 UNIT/ML ~~LOC~~ SOLN
0.0000 [IU] | Freq: Three times a day (TID) | SUBCUTANEOUS | Status: DC
  Administered 2020-10-01: 8 [IU] via SUBCUTANEOUS
  Administered 2020-10-01 – 2020-10-02 (×3): 2 [IU] via SUBCUTANEOUS
  Administered 2020-10-02: 3 [IU] via SUBCUTANEOUS

## 2020-10-01 NOTE — Progress Notes (Addendum)
Inpatient Diabetes Program Recommendations  AACE/ADA: New Consensus Statement on Inpatient Glycemic Control (2015)  Target Ranges:  Prepandial:   less than 140 mg/dL      Peak postprandial:   less than 180 mg/dL (1-2 hours)      Critically ill patients:  140 - 180 mg/dL   Lab Results  Component Value Date   GLUCAP 283 (H) 10/01/2020   HGBA1C 10.2 (H) 09/30/2020    Review of Glycemic Control Results for Luis [name], Luis [name] (MRN [id number]) as of 10/01/2020 10:28  Ref. Range 09/30/2020 13:29 09/30/2020 21:36 10/01/2020 05:56  Glucose-Capillary Latest Ref Range: 70 - 99 mg/dL 173 (H) 222 (H) 283 (H)   Diabetes history: DM 2 Outpatient Diabetes medications: Victoza 1.8 mg Daily, Humalog 75/25 60 units tid Current orders for Inpatient glycemic control:  Lantus 30 units Novolog 0-15 units tid + hs Novolog 6 units tid meal coverage  A1c 10.2% UDS +cocaine Hydrocortisone 200 mg x once given yesterday on 4/4  Inpatient Diabetes Program Recommendations:    -  Increase Lantus to 40 units  Addendum 6190 :  Spoke with pt at bedside regarding glucose control and A1c level. Pt reports seeing a new PCP in [place], however he does not like them. Pt reports needing glucose meter and insulin at time of d/c. Pt also asking if we have a pharmacy he can get his meds from here at the hospital. informed him of the [facility] pharmacy. Pt reports having issues with transportation and he has used up his 48 transportation times through one of his insurances. Discussed current A1c and went over Glucose and A1c goals.  Pt reports needing Novolog 75/25 and glucose meter kit at time of d/c.   Thanks,  Tama Headings RN, MSN, BC-ADM Inpatient Diabetes Coordinator Team Pager [phone] (8a-5p)

## 2020-10-01 NOTE — Progress Notes (Signed)
Called lab and spoke with [facility] in cytology. Gonorrhea and Chlamydia urine testing can be completed with urine that was sent earlier for UDS. Results pending.

## 2020-10-01 NOTE — Progress Notes (Signed)
Pt set up on CPAP of 17 CMH20 with full face mask.  Patient states this is his home regimen.

## 2020-10-01 NOTE — Consult Note (Addendum)
Cardiology Consultation:   Patient ID: Luis [name] MRN: [id number]; DOB: [birth year]  Admit date: 09/30/2020 Date of Consult: 10/01/2020  PCP:  Margot Ables, MD   [facility]  Cardiologist:  Bryan Lemma, MD , Dr. Zachery Conch with [facility] - will now see [name]  Patient Profile:   Luis [name] is a 50 y.o. 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 Miquel Dunn.  History of Present Illness:   [name] follows with [facility] cardiology. He was diagnosed with NICM in 2012 in [place] felt due to excessive cocaine use at the time ($1000/day). Prior notes suggest past angioplasty, no stent placement.  He reportedly had VT remotely and ultimately had ICD placed with gen change 06/2018. He was admitted to [facility] 04/2018 with chest pain found to have EF 25-30% with global hypokinesis. Nuclear stress tests in 08/2017 and 04/2018 with no inducible ischemia. He was seen by our service in July 2020 for chest pain, dizziness, and blurry vision. UDS negative. CT coronary 12/2018 was limited by BiV leads, but did not show coronary artery calcifications in the portions visualized. He was last seen by [facility] cardiology 03/07/19 and was doing well at that time, living in transitional housing. He was seen by our service again 06/2019 for chest pain in the setting of cocaine use (approximately $1300 throughout the preceding day). Given his positive UDS, no further ischemic evaluation was pursued. Last echo 07/26/19 with EF 35-40%, grade 2 DD, trivial MR.   He has an allergy to ASA, continued on plavix. No ACEI due to cough.   He presented to [facility] 09/27/20 for back pain and LWBS. He then presented to UC and was denied narcotics. He presented back to [facility] via EMS  09/30/20 with chest pain. He denied cocaine use, but UDS positive for cocaine yesterday. Cardiology consulted. Pt reports his ICD fired.    HS troponin  X 2 negative Mg 1.7 BNP 122 A1c 10.2% EKG appears to be A-sensed, V-paced with LBBB (old) Echo pending  During my interview, he states chest pain woke him from sleep at approximately 0730 yesterday. Pain radiated to the back of his neck and rated as a 10/10 and described as a twisting pain. He states this pain is not like prior events. He continued to have the chest pain this morning. CP lasts 1-2 min before spontaneously resolving. He denies using cocaine, he only touched/handled it with his hands. He states he can't rest flat, describes orthopnea and PND.   He does not know what medications he is taking. He gets blister packs form [facility]. I was able to call and verify his medications:  plavix 75 mg  metoprolol succinate 100 mg nightly Furosemide 80 mg daily crestor 20 mg daily humalog 75/25 60U TID divoprolax  Sertraline 50 mg  TID trazadone 100 mg 2 tabs at night  Packs mailed on the 12th - will need medications reconciled by [facility] tonight or tomorrow.    Past Medical History:  Diagnosis Date  . Anxiety   . Biventricular ICD (implantable cardioverter-defibrillator) in place   . Chronic systolic CHF (congestive heart failure) (HCC)   . Cocaine use   . Diabetes mellitus without complication (HCC)   . GERD (gastroesophageal reflux disease)   .  HLD (hyperlipidemia)   . Hypertension   . Morbid obesity (HCC)   . NICM (nonischemic cardiomyopathy) (HCC)   . OSA (obstructive sleep apnea)   . PTSD (post-traumatic stress disorder)    on Depakote  . Refusal of blood transfusions as patient is Jehovah's Witness   . Tobacco abuse     Past Surgical History:  Procedure Laterality Date  . BIV ICD INSERTION CRT-D    . FOOT FRACTURE SURGERY    . ROTATOR CUFF REPAIR    . TONSILLECTOMY       Home Medications:  Prior to  Admission medications   Medication Sig Start Date End Date Taking? Authorizing Provider  carvedilol (COREG) 6.25 MG tablet Take 1 tablet (6.25 mg total) by mouth 2 (two) times daily. 10/01/20 11/30/20 Yes Simmons-Robinson, Makiera, MD  clopidogrel (PLAVIX) 75 MG tablet Take 75 mg by mouth daily. 01/13/19  Yes [provider]  clotrimazole-betamethasone (LOTRISONE) cream Apply 1 a small amount to affected area twice a day 09/03/20  Yes [provider]  divalproex (DEPAKOTE) 500 MG DR tablet Take 500 mg by mouth 2 (two) times a day. 06/30/18  Yes [provider]  furosemide (LASIX) 80 MG tablet Take 80 mg by mouth daily. 09/13/18  Yes [provider]  HUMALOG MIX 75/25 KWIKPEN (75-25) 100 UNIT/ML Kwikpen Inject 60 Units into the skin 3 (three) times daily before meals.  01/13/19  Yes [provider]  meclizine (ANTIVERT) 25 MG tablet Take 1 tablet (25 mg total) by mouth 3 (three) times daily as needed for dizziness. 06/26/20  Yes Alvira Monday, MD  metoprolol succinate (TOPROL-XL) 100 MG 24 hr tablet Take 100 mg by mouth daily. Take with or immediately following a meal.   Yes [provider]  ondansetron (ZOFRAN ODT) 4 MG disintegrating tablet Take 1 tablet (4 mg total) by mouth every 8 (eight) hours as needed for nausea or vomiting. 06/26/20  Yes Alvira Monday, MD  pantoprazole (PROTONIX) 40 MG tablet Take 1 tablet (40 mg total) by mouth 2 (two) times daily. 07/27/19  Yes Vann, Jessica U, DO  prazosin (MINIPRESS) 2 MG capsule Take 2 mg by mouth at bedtime. 10/28/18  Yes [provider]  rosuvastatin (CRESTOR) 20 MG tablet Take 20 mg by mouth daily. 09/21/18  Yes [provider]  sertraline (ZOLOFT) 50 MG tablet Take 150 mg by mouth daily.  10/28/18  Yes [provider]  tamsulosin (FLOMAX) 0.4 MG CAPS capsule Take 1 capsule (0.4 mg total) by mouth daily after supper. 07/27/20  Yes Gwyneth Sprout, MD  traZODone (DESYREL) 100 MG  tablet Take 200 mg by mouth at bedtime.  12/19/18  Yes [provider]  VICTOZA 18 MG/3ML SOPN Inject 1.8 mg into the skin daily. 09/26/20  Yes [provider]  fluconazole (DIFLUCAN) 150 MG tablet TAKE 1 TABLET BY MOUTH EVERY 72 HOURS FOR YEAST FOR 3 DOSES Patient not taking: Reported on 09/30/2020 09/11/20   [provider]  isosorbide mononitrate (IMDUR) 30 MG 24 hr tablet Take 1 tablet (30 mg total) by mouth daily. Patient not taking: Reported on 09/30/2020 07/28/19   Gwenyth Bender, NP  oxyCODONE-acetaminophen (PERCOCET/ROXICET) 5-325 MG tablet Take 1-2 tablets by mouth every 6 (six) hours as needed for severe pain. Patient not taking: No sig reported 07/27/20   Gwyneth Sprout, MD    Inpatient Medications: Scheduled Meds: . clopidogrel  75 mg Oral Daily  . diclofenac Sodium  2 g Topical QID  . divalproex  500 mg Oral Q12H  . folic acid  1 mg Oral Daily  . furosemide  80 mg Oral Daily  . gabapentin  100 mg Oral Once  . heparin  5,000 Units Subcutaneous Q8H  . insulin aspart  0-15 Units Subcutaneous TID WC  . insulin aspart  0-5 Units Subcutaneous QHS  . insulin aspart  6 Units Subcutaneous TID WC  . insulin glargine  30 Units Subcutaneous QHS  . pantoprazole  40 mg Oral BID  . prazosin  2 mg Oral QHS  . rosuvastatin  20 mg Oral Daily  . sertraline  150 mg Oral Daily  . tamsulosin  0.4 mg Oral QPC supper  . thiamine  100 mg Oral Daily   Or  . thiamine  100 mg Intravenous Daily   Continuous Infusions:  PRN Meds: acetaminophen, polyethylene glycol  Allergies:    Allergies  Allergen Reactions  . Aspirin Anaphylaxis    Throat closing   . Iodine-131 Hives    Hives  . Iodinated Diagnostic Agents Hives  . Latex Rash and Hives  . Tramadol Hives and Nausea Only  . Penicillins Nausea And Vomiting  . Amoxicillin-Pot Clavulanate Diarrhea  . Lidocaine Rash  . Lisinopril Cough    Social History:   Social History   Socioeconomic History  . Marital  status: Legally Separated    Spouse name: Not on file  . Number of children: Not on file  . Years of education: Not on file  . Highest education level: Not on file  Occupational History  . Not on file  Tobacco Use  . Smoking status: Current Every Day Smoker    Types: Cigarettes  . Smokeless tobacco: Never Used  . Tobacco comment: occasionally  Vaping Use  . Vaping Use: Never used  Substance and Sexual Activity  . Alcohol use: Not Currently  . Drug use: Yes    Types: Cocaine  . Sexual activity: Not on file  Other Topics Concern  . Not on file  Social History Narrative  . Not on file   Social Determinants of Health   Financial Resource Strain: Not on file  Food Insecurity: Not on file  Transportation Needs: Not on file  Physical Activity: Not on file  Stress: Not on file  Social Connections: Not on file  Intimate Partner Violence: Not on file    Family History:    Family History  Problem Relation Age of Onset  . Hypertension Mother   . Bone cancer Father   . Lung cancer Father      ROS:  Please see the history of present illness.   All other ROS reviewed and negative.     Physical Exam/Data:   Vitals:   10/01/20 0553 10/01/20 0805 10/01/20 1230 10/01/20 1300  BP: 126/62 121/64 (!) 89/38 125/60  Pulse: 71 67 67 67  Resp: 20 18 18    Temp: 97.7 F (36.5 C)  98.5 F (36.9 C)   TempSrc: Oral  Oral   SpO2: 96% 96% 96%   Weight:      Height:        Intake/Output Summary (Last 24 hours) at 10/01/2020 1544 Last data filed at 09/30/2020 2319 Gross per 24 hour  Intake 360 ml  Output 800 ml  Net -440 ml   Last 3 Weights 09/30/2020 09/30/2020 07/31/2020  Weight (lbs) 270 lb 15.1 oz 266 lb 15.6 oz 267 lb  Weight (kg) 122.9 kg 121.1 kg 121.11 kg     Body mass index  is 44.4 kg/m.  General:  Obese male in NAD Neck: no JVD Vascular: No carotid bruits Cardiac:  normal S1, S2; RRR; no murmur  Lungs:  clear to auscultation bilaterally, no wheezing, rhonchi or rales   Abd: soft, nontender, no hepatomegaly  Ext: no edema Musculoskeletal:  No deformities, BUE and BLE strength normal and equal Skin: warm and dry  Neuro:  CNs 2-12 intact, no focal abnormalities noted Psych:  Normal affect   EKG:  The EKG was personally reviewed and demonstrates:  A-sensed, V-paced, LBBB Telemetry:  Telemetry was personally reviewed and demonstrates:  Paced rhythm in the 70s at rest  Relevant CV Studies:  Echo pending today  Echo 07/26/19: 1. Left ventricular ejection fraction, by visual estimation, is 35 to  40%. The left ventricle has moderate to severely decreased function. There  is moderately increased left ventricular hypertrophy.  2. Left ventricular diastolic parameters are consistent with Grade II  diastolic dysfunction (pseudonormalization).  3. Mild to moderately dilated left ventricular internal cavity size.  4. The left ventricle demonstrates global hypokinesis.  5. Global right ventricle has normal systolic function.The right  ventricular size is normal. No increase in right ventricular wall  thickness.  6. Left atrial size was normal.  7. Right atrial size was normal.  8. Trivial pericardial effusion is present.  9. The mitral valve is normal in structure. Trivial mitral valve  regurgitation.  10. The tricuspid valve is normal in structure.  11. The tricuspid valve is normal in structure. Tricuspid valve  regurgitation is trivial.  12. The aortic valve is tricuspid. Aortic valve regurgitation is not  visualized. No evidence of aortic valve sclerosis or stenosis.  13. The pulmonic valve was grossly normal. Pulmonic valve regurgitation is  not visualized.  14. Mildly elevated pulmonary artery systolic pressure.  15. The inferior vena cava is normal in size with greater than 50%  respiratory variability, suggesting right atrial pressure of 3 mmHg.  16. No prior Echocardiogram.   Laboratory Data:  High Sensitivity Troponin:   Recent Labs   Lab 09/30/20 1142 09/30/20 1325  TROPONINIHS 17 15     Chemistry Recent Labs  Lab 09/30/20 1142 10/01/20 0435  NA 137 134*  K 3.7 4.1  CL 103 100  CO2 27 25  GLUCOSE 191* 309*  BUN 5* 7  CREATININE 0.80 0.98  CALCIUM 8.5* 8.7*  GFRNONAA >60 >60  ANIONGAP 7 9    Recent Labs  Lab 10/01/20 0435  PROT 5.6*  ALBUMIN 3.0*  AST 17  ALT 17  ALKPHOS 37*  BILITOT 0.6   Hematology Recent Labs  Lab 09/30/20 1142 10/01/20 0435  WBC 8.8 10.4  RBC 4.50 4.76  HGB 13.3 13.9  HCT 40.5 42.0  MCV 90.0 88.2  MCH 29.6 29.2  MCHC 32.8 33.1  RDW 12.9 12.8  PLT 205 216   BNP Recent Labs  Lab 09/30/20 1744  BNP 121.6*    DDimer No results for input(s): DDIMER in the last 168 hours.   Radiology/Studies:  DG Lumbar Spine Complete  Result Date: 09/27/2020 CLINICAL DATA:  Low back pain EXAM: LUMBAR SPINE - COMPLETE 4+ VIEW COMPARISON:  None. FINDINGS: Five non rib-bearing lumbar type vertebra. Lumbar alignment within normal limits. Vertebral body heights are maintained. The disc spaces are grossly patent. There are minimal degenerative osteophytes. Minor facet degenerative changes of the lower lumbar spine IMPRESSION: Minimal degenerative changes. No acute osseous abnormality. Electronically Signed   By: Jasmine Pang M.D.   On:  09/27/2020 17:06   DG Chest Portable 1 View  Result Date: 09/30/2020 CLINICAL DATA:  Chest pain 04/05/2009 throbbing midsternal chest pain radiating to neck and down RIGHT arm EXAM: PORTABLE CHEST 1 VIEW COMPARISON:  December of 2021 FINDINGS: LEFT-sided 3 lead pacer defibrillator in place as before. Trachea midline. Cardiac enlargement as before. Lungs are clear. Limited skeletal assessment is unremarkable. IMPRESSION: No active disease. Electronically Signed   By: Donzetta KohutGeoffrey  Wile M.D.   On: 09/30/2020 12:42   CT ANGIO CHEST AORTA W/CM & OR WO/CM  Result Date: 09/30/2020 CLINICAL DATA:  [age] year old male with chest pain. EXAM: CT ANGIOGRAPHY CHEST WITH  CONTRAST TECHNIQUE: Multidetector CT imaging of the chest was performed using the standard protocol during bolus administration of intravenous contrast. Multiplanar CT image reconstructions and MIPs were obtained to evaluate the vascular anatomy. CONTRAST:  100mL OMNIPAQUE IOHEXOL 350 MG/ML SOLN COMPARISON:  Chest radiograph dated 09/30/2020. FINDINGS: Cardiovascular: There is mild cardiomegaly. No pericardial effusion. Left pectoral AICD device. The thoracic aorta is unremarkable. The origins of the great vessels of the aortic arch appear patent as visualized. Evaluation of the pulmonary arteries is limited due to respiratory motion artifact and suboptimal opacification and timing of the contrast. No large or central pulmonary artery embolus identified. Mediastinum/Nodes: There is no hilar or mediastinal adenopathy. The esophagus and the thyroid gland are grossly unremarkable. No mediastinal fluid collection. Lungs/Pleura: The lungs are clear. There is no pleural effusion pneumothorax. The central airways are patent. Upper Abdomen: No acute abnormality. Musculoskeletal: Degenerative changes of the spine. No acute osseous pathology. Review of the MIP images confirms the above findings. IMPRESSION: 1. No acute intrathoracic pathology. No CT evidence of central pulmonary artery embolus. 2. Mild cardiomegaly. Electronically Signed   By: Elgie CollardArash  Radparvar M.D.   On: 09/30/2020 22:40     Assessment and Plan:   Chest pain - question of CAD - UDS positive for cocaine - allergy to ASA, continue plavix and crestor - has been taking 100 mg toprol - question changing this to coreg in the setting of cocaine use, although not a lot of BP room - he has used cocaine on metoprolol without negative effects, do not feel strongly about changing BB - currently taking 100 mg toprol nightly   NICM Chronic systolic and diastolic heart failure - echo Jan 2021 with EF 35-40%, grade 2 DD - maintained on 80 mg lasix daily,  BB - he reports orthopnea, although does not appear extremely volume overloaded   [employer] BiV device in place - no shocks delivered, SVT noted   Hypertension - medications as above   Hyperlipidemia with LDL goal < 70 - continue crestor - will defer to primary cardiologist to follow lipids   Obesity - activity on ICD estimated at 1.1 hrs per day - BMI > 44   Cocaine use disorder - needs to stop    I verified his medications with [facility]. They are preparing his blister packs now to mail on the 12th. I do not think we need to make medication changes at this time. Will discuss with Dr. Elease HashimotoNahser. Likely no further workup. I will arrange a cardiology appt follow up with [facility]'s cardiology team.     Risk Assessment/Risk Scores:     HEAR Score (for undifferentiated chest pain):  HEAR Score: 6  New York Heart Association (NYHA) Functional Class NYHA Class III        For questions or updates, please contact [facility] Please consult [email or web address] for contact  info under    Signed, Marcelino Duster, [place]  10/01/2020 3:44 PM   Attending Note:   The patient was seen and examined.  Agree with assessment and plan as noted above.  Changes made to the above note as needed.  Patient seen and independently examined with  Bettina Gavia, PA .   We discussed all aspects of the encounter. I agree with the assessment and plan as stated above.  1.   Chest pain :   Atypical , troponin is negative. Echo - LV function is basically unchanged ( prelim echo reading)  He is on optimal medications.  Coronary CTA from 2020 showed no obvious plaque in the coronary arteries.  The study was somewhat limited by blooming artifact from his ICD lead .  I've told him that his primary issue is his significant cocaine habit.   I've told him that his cocaine addition is the cause of his cp and his CHf. I do not think we need to change our meds - he needs to stop using cocaine in order for him to  improve  I suggested he might benefit from a rehab program. Will ask Dr. Miquel Dunn if that is a possibility   No further suggestions.  [facility] will sign off.   Medication Recommendations:  Continue current meds  Other recommendations (labs, testing, etc):   Follow up as an outpatient:  Drug rehab program,  Follow up with his primary MD      I have spent a total of 40 minutes with patient reviewing hospital  notes , telemetry, EKGs, labs and examining patient as well as establishing an assessment and plan that was discussed with the patient. > 50% of time was spent in direct patient care.    Vesta Mixer, Montez Hageman., MD, [facility] 10/01/2020, 4:05 PM [address],  [address] Office [phone] Pager [phone]

## 2020-10-01 NOTE — Evaluation (Signed)
Occupational Therapy Evaluation Patient Details Name: Luis [name] MRN: [id number] DOB: 26-May-1971 Today's Date: 10/01/2020    History of Present Illness Patient is a 50 y/o male who presents on 10/01/20 with chest pain, SOB, LE edema, N/V. Admitted for ACS rule out.  PMH includes OSA, HTN, PTSD, NICM, morbid obesity, DM, CHF, ICD.   Clinical Impression   Eval limited due to fatigue and back pain. Pt states that he has not been able to get much sleep. Pt sat EOB with Sup and participate in minimal sitting tolerance tasks, grooming and simulate bathing tasks. Pt stood from EOB min guard A. PTA pt lived at home alone and was Ind with ADLs/selfcare and mobility. Pt would benefit from acute OT services to address impairments to maximize level of function and safety    Follow Up Recommendations  Home health OT;Supervision - Intermittent    Equipment Recommendations  Other (comment) (ADL A/E kit, TBD)    Recommendations for Other Services       Precautions / Restrictions Precautions Precautions: Fall Restrictions Weight Bearing Restrictions: No      Mobility Bed Mobility Overal bed mobility: Needs Assistance Bed Mobility: Supine to Sit;Sit to Supine     Supine to sit: Supervision;HOB elevated Sit to supine: Supervision;HOB elevated   General bed mobility comments: used rail, HOB raised    Transfers Overall transfer level: Needs assistance Equipment used: None Transfers: Sit to/from Stand Sit to Stand: Min guard         General transfer comment: Min guard for safety. Slow to rise with wide BoS.    Balance Overall balance assessment: Needs assistance;History of Falls Sitting-balance support: Feet supported;No upper extremity supported Sitting balance-Leahy Scale: Good Sitting balance - Comments: supervision for safety. Able to donn pants sitting EOB with increased time.   Standing balance support: During functional activity Standing balance-Leahy Scale:  Fair Standing balance comment: Close Min guard for standing, wide BOS for balance.                           ADL either performed or assessed with clinical judgement   ADL Overall ADL's : Needs assistance/impaired Eating/Feeding: Independent;Sitting   Grooming: Wash/dry hands;Wash/dry face;Set up;Supervision/safety;Sitting   Upper Body Bathing: Min guard;Sitting Upper Body Bathing Details (indicate cue type and reason): simulated Lower Body Bathing: Minimal assistance;Sitting/lateral leans Lower Body Bathing Details (indicate cue type and reason): simulated                       General ADL Comments: pt politely declined further ADLs due to back pain and fatigue     Vision Patient Visual Report: No change from baseline       Perception     Praxis      Pertinent Vitals/Pain Pain Assessment: 0-10 Pain Score: 5  Faces Pain Scale: Hurts even more Pain Location: back Pain Descriptors / Indicators: Sore;Aching Pain Intervention(s): Limited activity within patient's tolerance;Monitored during session;Repositioned;Premedicated before session     Hand Dominance Right   Extremity/Trunk Assessment Upper Extremity Assessment Upper Extremity Assessment: Overall WFL for tasks assessed RUE Deficits / Details: Reports decreased sensation RUE and into hand- chronic RUE Sensation: decreased light touch   Lower Extremity Assessment Lower Extremity Assessment: Defer to PT evaluation RLE Deficits / Details: Grossly ~3/5 throughout with some decreased sensation, worse than LLE. Reports as chronic RLE Sensation: decreased light touch LLE Deficits / Details: Grossly ~3+/5 throughout. Impaired sensation in  foot LLE Sensation: decreased light touch   Cervical / Trunk Assessment Cervical / Trunk Assessment: Normal   Communication Communication Communication: No difficulties   Cognition Arousal/Alertness: Awake/alert Behavior During Therapy: Flat affect Overall  Cognitive Status: Within Functional Limits for tasks assessed                                     General Comments  VSS on RA.    Exercises     Shoulder Instructions      Home Living Family/patient expects to be discharged to:: Private residence Living Arrangements: Alone Available Help at Discharge: Friend(s);Available PRN/intermittently Type of Home: Apartment Home Access: Level entry     Home Layout: One level     Bathroom Shower/Tub: Occupational psychologist: Handicapped height     Home Equipment: Cane - single point          Prior Functioning/Environment Level of Independence: Independent with assistive device(s)        Comments: Does not drive. Gets some assist from lady friend/neighbor? Reports 1 fall in last 2 weeks. Wears 2.5L/min 02 [place] at home.        OT Problem List: Pain;Decreased activity tolerance;Decreased knowledge of use of DME or AE;Impaired balance (sitting and/or standing)      OT Treatment/Interventions: Self-care/ADL training;DME and/or AE instruction;Therapeutic activities;Balance training;Patient/family education    OT Goals(Current goals can be found in the care plan section) Acute Rehab OT Goals Patient Stated Goal: to feel better OT Goal Formulation: With patient Time For Goal Achievement: 10/15/20 Potential to Achieve Goals: Good ADL Goals Pt Will Perform Grooming: with supervision;with set-up;standing Pt Will Perform Upper Body Bathing: with set-up;with modified independence;sitting;standing Pt Will Perform Lower Body Bathing: with min assist;with min guard assist;with adaptive equipment Pt Will Perform Upper Body Dressing: with set-up;with modified independence;sitting Pt Will Perform Lower Body Dressing: with min assist;with min guard assist;with adaptive equipment Pt Will Transfer to Toilet: with min guard assist;with supervision;ambulating Pt Will Perform Toileting - Clothing Manipulation and hygiene:  with min guard assist;with supervision;sit to/from stand;with min assist Pt Will Perform Tub/Shower Transfer: with min guard assist;with supervision;with modified independence;ambulating  OT Frequency: Min 2X/week   Barriers to D/C:            Co-evaluation              AM-PAC OT "6 Clicks" Daily Activity     Outcome Measure Help from another person eating meals?: None Help from another person taking care of personal grooming?: A Little Help from another person toileting, which includes using toliet, bedpan, or urinal?: A Lot Help from another person bathing (including washing, rinsing, drying)?: A Lot Help from another person to put on and taking off regular upper body clothing?: A Little Help from another person to put on and taking off regular lower body clothing?: A Lot 6 Click Score: 16   End of Session    Activity Tolerance: Patient limited by fatigue;Patient limited by pain Patient left: in bed;with bed alarm set;with call bell/phone within reach  OT Visit Diagnosis: Other abnormalities of gait and mobility (R26.89);Pain Pain - part of body:  (back)                Time: 1250-1306 OT Time Calculation (min): 16 min Charges:  OT General Charges $OT Visit: 1 Visit OT Evaluation $OT Eval Moderate Complexity: 1 Mod    Shakedra Beam,  Harmon Dun 10/01/2020, 2:49 PM

## 2020-10-01 NOTE — Hospital Course (Addendum)
[name] is a 50 year old male who presented with chest pain.  PMH significant for IDDM, HTN, CAD s/p biventricular ICD, cocaine abuse, HFrEF, major depression, anxiety, GERD, sleep apnea, history of Covid December 2021.  Chest Pain  ACS Rule Out  Patient initially reported chest pain radiating to his back. CTA chest and abdomen was negative for aortic dissection and pulmonary embolism. EKG had no signs of ischemia and troponins were 17 and 15.  Echo unchanged from your prior.  Cardiology was consulted and recommended cessation of cocaine and no further follow-up. Patient was found to be positive for cocaine on admission UDS so his home metroprolol was temporarily changed to Coreg 6.25mg  BID. Due to patient issues with having pre-packaged medications and cardiology not recommending beta blocker agent change; he was discharged on home dose of metoprolol. Echo showed EF 30-35%, global hypokinesis, LVH, Grd II DD.   Diabetes, Type 2  Hgb A1c was 10.2 on admission. His blood glucose was monitored, glucose measurements stable on Lantus 30 daily.  Suspect noncompliance at home.  Discharged on NovoLog 70/30 20 units twice daily.  PCP to follow-up make changes.

## 2020-10-01 NOTE — Evaluation (Signed)
Physical Therapy Evaluation Patient Details Name: Luis [name] MRN: [id number] DOB: Nov 17, 1970 Today's Date: 10/01/2020   History of Present Illness  Patient is a 50 y/o male who presents on 10/01/20 with chest pain, SOB, LE edema, N/V. Admitted for ACS rule out.  PMH includes OSA, HTN, PTSD, NICM, morbid obesity, DM, CHF, ICD.  Clinical Impression  Patient presents with generalized weakness (Right side>left side), impaired sensation, chronic pain, impaired balance, decreased activity tolerance and impaired mobility s/p above. Pt lives at home alone and reports using [facility] as needed. Does endorse 1 fall recently due to poor balance. Has minimal support from lady neighbor. Pt does not drive. Been dealing with chronic pain in back. Today, session limited due to pain and pt wanting to get some sleep. Tolerated bed mobility, standing and taking a few steps along side bed with min guard assist. VSS on RA. Encouraged OOB to chair and changing positions frequently to help with back discomfort. Will further assess DME needs next session when pt willing to walk. Will follow acutely to maximize independence and mobility prior to return home.    Follow Up Recommendations Home health PT;Supervision - Intermittent (pending progress)    Equipment Recommendations  Other (comment) (TBA)    Recommendations for Other Services       Precautions / Restrictions Precautions Precautions: Fall Restrictions Weight Bearing Restrictions: No      Mobility  Bed Mobility Overal bed mobility: Needs Assistance Bed Mobility: Supine to Sit;Sit to Supine     Supine to sit: Supervision;HOB elevated Sit to supine: Supervision;HOB elevated   General bed mobility comments: Use of rail to get to EOB.    Transfers Overall transfer level: Needs assistance Equipment used: None Transfers: Sit to/from Stand Sit to Stand: Min guard         General transfer comment: Min guard for safety. Slow to rise with wide  BoS.  Ambulation/Gait Ambulation/Gait assistance: Min guard Gait Distance (Feet): 3 Feet Assistive device: None       General Gait Details: Able to take a few steps along side bed but declined ambulation as pt wanting to go back to sleep (just had been up to bathroom with tech). Sp02 95% on RA with minimal activity. Reports some chest pressure, neck pain radiating to RUE as well. RN present and aware.  Stairs            Wheelchair Mobility    Modified Rankin (Stroke Patients Only)       Balance Overall balance assessment: Needs assistance;History of Falls Sitting-balance support: Feet supported;No upper extremity supported Sitting balance-Leahy Scale: Good Sitting balance - Comments: supervision for safety. Able to donn pants sitting EOB with increased time.   Standing balance support: During functional activity Standing balance-Leahy Scale: Fair Standing balance comment: Close Min guard for standing, wide BOS for balance.                             Pertinent Vitals/Pain Pain Assessment: Faces Faces Pain Scale: Hurts even more Pain Location: back Pain Descriptors / Indicators: Sore;Aching Pain Intervention(s): Monitored during session;Repositioned;Limited activity within patient's tolerance;RN gave pain meds during session    Home Living Family/patient expects to be discharged to:: Private residence Living Arrangements: Alone Available Help at Discharge: Friend(s);Available PRN/intermittently Type of Home: Apartment Home Access: Level entry     Home Layout: One level Home Equipment: Cane - single point      Prior Function Level  of Independence: Independent with assistive device(s)         Comments: Does not drive. Gets some assist from lady friend/neighbor? Reports 1 fall in last 2 weeks. Wears 2.5L/min 02 [place] at home.     Hand Dominance   Dominant Hand: Right    Extremity/Trunk Assessment   Upper Extremity Assessment Upper Extremity  Assessment: RUE deficits/detail RUE Deficits / Details: Reports decreased sensation RUE and into hand- chronic RUE Sensation: decreased light touch    Lower Extremity Assessment Lower Extremity Assessment: Generalized weakness;RLE deficits/detail;LLE deficits/detail RLE Deficits / Details: Grossly ~3/5 throughout with some decreased sensation, worse than LLE. Reports as chronic RLE Sensation: decreased light touch LLE Deficits / Details: Grossly ~3+/5 throughout. Impaired sensation in foot LLE Sensation: decreased light touch    Cervical / Trunk Assessment Cervical / Trunk Assessment: Normal  Communication   Communication: No difficulties  Cognition Arousal/Alertness: Awake/alert Behavior During Therapy: Flat affect Overall Cognitive Status: Within Functional Limits for tasks assessed                                        General Comments General comments (skin integrity, edema, etc.): VSS on RA.    Exercises     Assessment/Plan    PT Assessment Patient needs continued PT services  PT Problem List Decreased strength;Decreased mobility;Pain;Impaired sensation;Decreased balance;Decreased activity tolerance       PT Treatment Interventions Therapeutic exercise;Gait training;Patient/family education;Therapeutic activities;Functional mobility training;Balance training;DME instruction    PT Goals (Current goals can be found in the Care Plan section)  Acute Rehab PT Goals Patient Stated Goal: to feel better PT Goal Formulation: With patient Time For Goal Achievement: 10/15/20 Potential to Achieve Goals: Good    Frequency Min 3X/week   Barriers to discharge Decreased caregiver support      Co-evaluation               AM-PAC PT "6 Clicks" Mobility  Outcome Measure Help needed turning from your back to your side while in a flat bed without using bedrails?: A Little Help needed moving from lying on your back to sitting on the side of a flat bed  without using bedrails?: A Little Help needed moving to and from a bed to a chair (including a wheelchair)?: A Little Help needed standing up from a chair using your arms (e.g., wheelchair or bedside chair)?: A Little Help needed to walk in hospital room?: A Little Help needed climbing 3-5 steps with a railing? : A Little 6 Click Score: 18    End of Session   Activity Tolerance: Patient limited by pain Patient left: in bed;with call bell/phone within reach;with bed alarm set Nurse Communication: Mobility status PT Visit Diagnosis: Pain;Muscle weakness (generalized) (M62.81);Difficulty in walking, not elsewhere classified (R26.2);Unsteadiness on feet (R26.81) Pain - part of body:  (back, neck, RUE)    Time: 6644-0347 PT Time Calculation (min) (ACUTE ONLY): 21 min   Charges:   PT Evaluation $PT Eval Moderate Complexity: 1 Mod          Luis [name], PT, DPT Acute Rehabilitation Services Pager [phone] Office [phone]      Luis [name] 10/01/2020, 12:01 PM

## 2020-10-01 NOTE — Progress Notes (Signed)
Family Medicine Teaching Service Daily Progress Note Intern Pager: [phone]  Patient name: Luis [name] Medical record number: [id number] Date of birth: 09/19/1970 Age: 50 y.o. Gender: male  Primary Care Provider: Margot Ables, MD Consultants: None  Code Status: Full  Pt Overview and Major Events to Date:  4/4 admitted  Assessment and Plan: MR. Pitcock is a 50 year old male presenting with chest pain.  PMH significant for IDDM, HTN, CAD s/p biventricular ICD, cocaine abuse, HFrEF, major depression, anxiety, GERD, sleep apnea, history of Covid December 2021.  Chest pain  ACS rule out  Dissection rule out Patient continues to complain of chest pain that radiates to his back.  BNP only slightly elevated at 121.6.  CTA chest abdomen negative for dissection and PE.  Mag and Phos normal. UDS positive for cocaine.  Given normal EKG and negative troponins (ruling out N/STEMI) along with negative CTA chest abdomen (ruling out aortic dissection and PE), and minimal evidence of acute CHF exacerbation (discussed below), cocaine abuse in context of known extensive cardiac history is the most likely explanation for his current chest pain. -Echo performed, results pending -Consulted TOC for substance abuse counseling -Cardiology consulted for pacemaker defibrillator interrogation -Expect discharge if echo and pacemaker fibrillator interrogation return normal results  Tobacco abuse  Alcohol abuse  Cocaine abuse Admission ethanol negative.  UDS positive for cocaine.  Patient continues to decline nicotine patch. - CIWA and COWS q4 -Continue thiamine and folate  Chronic systolic CHF  Nonischemic cardiomyopathy  CAD  Biventricular ICD -Continue home meds of Plavix and Imdur -BNP slightly elevated at 121.6, not suggestive of acute CHF exacerbation -Echo performed, pending read -Heart healthy diet  Insulin dependent diabetes mellitus Hemoglobin A1c 10.2 on admission.   Glucose this 24 hours 147-309.  Has received 30 units Lantus, total of 16 units insulin aspart. - Lantus 30 units daily - moderate SSI with meals and qHS coverage  Penile Complaints Per patient request, provided STD testing.  HIV and RPR negative. -Urine gonorrhea chlamydia pending  Chronic back pain - home meds: Percocet 5-325mg  1-2 tablets q6 PRN (patient reports, however per PDMP patient will received oxycodone-acetaminophen, 6 count on 07/27/2020 and hydrocodone-acetaminophen 15 count on 07/30/2020) - decline to restart at this time - offer ice or heat PRN - Acetaminophen - PT/OT eval and treat: Recommend home health PT, home health OT  GERD Continue home Protonix.  Hyperlipidemia Continue home rosuvastatin.  Hypertension Continue home metoprolol succinate.  Depression  Anxiety  PTSD  Nightmares Home meds: depakote 500 mg BID, prazosin 4 mg qHS, sertraline 150 mg daily, trazodone 200 mg qHS.  -Continue home Depakote, prazosin, sertraline - hold trazodone for now  Obstructive Sleep Apnea Offer nightly CPAP.  BPH Continue home tamsulosin.  Dizziness Continue home meclizine and Zofran ODT.   FEN/GI: Heart healthy PPx: Patient ambulatory   Status is: Observation  The patient remains OBS appropriate and will d/c before 2 midnights.  Dispo:  Patient From: Home  Planned Disposition: Home  Medically stable for discharge: No      Subjective:  Patient lying comfortably in bed watching television.  Still reports chest pain that radiates to back.  After explanation of serious conditions we have ruled out (MI, PE, aortic dissection), discussed that with his UDS positive for cocaine, cocaine abuse in the context of his known extensive cardiac history is the most likely cause of his chest pain.  After mentioning the positive UDS, patient became withdrawn and aggravated, kept his arms crosses on  his chest.   Objective: Temp:  [97.4 F (36.3 C)-98.5 F (36.9  C)] 98.5 F (36.9 C) (04/05 1230) Pulse Rate:  [59-92] 67 (04/05 1300) Resp:  [16-20] 18 (04/05 1230) BP: (89-145)/(38-79) 125/60 (04/05 1300) SpO2:  [94 %-96 %] 96 % (04/05 1230) Weight:  [122.9 kg] 122.9 kg (04/04 2018) Physical Exam: General: Awake, alert, no acute distress Cardiovascular: Regular rate and rhythm, no murmur appreciated Respiratory: CTA B Extremities: Moving all spontaneously  Laboratory: Recent Labs  Lab 09/30/20 1142 10/01/20 0435  WBC 8.8 10.4  HGB 13.3 13.9  HCT 40.5 42.0  PLT 205 216   Recent Labs  Lab 09/30/20 1142 10/01/20 0435  NA 137 134*  K 3.7 4.1  CL 103 100  CO2 27 25  BUN 5* 7  CREATININE 0.80 0.98  CALCIUM 8.5* 8.7*  PROT  --  5.6*  BILITOT  --  0.6  ALKPHOS  --  37*  ALT  --  17  AST  --  17  GLUCOSE 191* 309*    Imaging/Diagnostic Tests: CT ANGIOGRAPHY CHEST WITH CONTRAST 09/30/2020 IMPRESSION: 1. No acute intrathoracic pathology. No CT evidence of central pulmonary artery embolus. 2. Mild cardiomegaly.   Fayette Pho, MD 10/01/2020, 3:52 PM PGY-1, [facility] Family Medicine FPTS Intern pager: [phone], text pages welcome

## 2020-10-01 NOTE — Progress Notes (Signed)
Patient refused AM weight. 

## 2020-10-02 ENCOUNTER — Other Ambulatory Visit (HOSPITAL_COMMUNITY): Payer: Self-pay

## 2020-10-02 DIAGNOSIS — R0789 Other chest pain: Secondary | ICD-10-CM | POA: Diagnosis not present

## 2020-10-02 DIAGNOSIS — I428 Other cardiomyopathies: Secondary | ICD-10-CM | POA: Diagnosis not present

## 2020-10-02 DIAGNOSIS — J9601 Acute respiratory failure with hypoxia: Secondary | ICD-10-CM | POA: Diagnosis not present

## 2020-10-02 DIAGNOSIS — R079 Chest pain, unspecified: Secondary | ICD-10-CM | POA: Diagnosis not present

## 2020-10-02 LAB — GLUCOSE, CAPILLARY
Glucose-Capillary: 122 mg/dL — ABNORMAL HIGH (ref 70–99)
Glucose-Capillary: 155 mg/dL — ABNORMAL HIGH (ref 70–99)

## 2020-10-02 MED ORDER — HUMALOG MIX 75/25 KWIKPEN (75-25) 100 UNIT/ML ~~LOC~~ SUPN
20.0000 [IU] | PEN_INJECTOR | Freq: Two times a day (BID) | SUBCUTANEOUS | 11 refills | Status: DC
Start: 2020-10-02 — End: 2020-10-02
  Filled 2020-10-02: qty 15, 30d supply, fill #0
  Filled 2020-10-02: qty 12, 30d supply, fill #0

## 2020-10-02 MED ORDER — VICTOZA 18 MG/3ML ~~LOC~~ SOPN: 1.8000 mg | PEN_INJECTOR | Freq: Every day | SUBCUTANEOUS | 0 refills | Status: DC

## 2020-10-02 MED ORDER — CARVEDILOL 6.25 MG PO TABS
6.2500 mg | ORAL_TABLET | Freq: Two times a day (BID) | ORAL | Status: DC
  Administered 2020-10-02: 6.25 mg via ORAL
  Filled 2020-10-02: qty 1

## 2020-10-02 MED ORDER — HUMALOG MIX 75/25 KWIKPEN (75-25) 100 UNIT/ML ~~LOC~~ SUPN: 20.0000 [IU] | PEN_INJECTOR | Freq: Two times a day (BID) | SUBCUTANEOUS | 11 refills | Status: DC

## 2020-10-02 NOTE — Progress Notes (Signed)
D/C instructions given and reviewed. Tele and IV removed, tolerated well. Awaiting TOC med delivery. 

## 2020-10-02 NOTE — Progress Notes (Signed)
Physical Therapy Treatment Patient Details Name: Luis [name] MRN: [id number] DOB: [birth year] Today's Date: 10/02/2020    History of Present Illness Patient is a 50 y/o male who presents on 10/01/20 with chest pain, SOB, LE edema, N/V. Admitted for ACS rule out.  PMH includes OSA, HTN, PTSD, NICM, morbid obesity, DM, CHF, ICD.    PT Comments    Pt with improved activity tolerance this session, ambulating for increased distances despite continued reports of back pain. Pt utilizing RW to improve balance and activity tolerance. Pt will benefit from continued acute PT services to aide in restoring the patient's prior level of function. PT recommends discharge home with HHPT and a RW.   Follow Up Recommendations  Home health PT;Supervision - Intermittent     Equipment Recommendations  Rolling walker with 5" wheels    Recommendations for Other Services       Precautions / Restrictions Precautions Precautions: Fall Restrictions Weight Bearing Restrictions: No    Mobility  Bed Mobility Overal bed mobility: Needs Assistance Bed Mobility: Supine to Sit     Supine to sit: Min guard     General bed mobility comments: hand hold    Transfers Overall transfer level: Needs assistance Equipment used: None Transfers: Sit to/from Stand Sit to Stand: Supervision            Ambulation/Gait Ambulation/Gait assistance: Supervision Gait Distance (Feet): 150 Feet Assistive device: Rolling walker (2 wheeled) Gait Pattern/deviations: Step-through pattern Gait velocity: reduced Gait velocity interpretation: <1.8 ft/sec, indicate of risk for recurrent falls General Gait Details: pt with slowed step-through gait, one brief standing rest break due to reported increased work of breathing, sats stable on RA   Stairs             Wheelchair Mobility    Modified Rankin (Stroke Patients Only)       Balance Overall balance assessment: Needs assistance Sitting-balance  support: No upper extremity supported;Feet supported Sitting balance-Leahy Scale: Good     Standing balance support: No upper extremity supported;During functional activity Standing balance-Leahy Scale: Fair                              Cognition Arousal/Alertness: Awake/alert Behavior During Therapy: Flat affect Overall Cognitive Status: Within Functional Limits for tasks assessed                                        Exercises      General Comments General comments (skin integrity, edema, etc.): VSS on RA      Pertinent Vitals/Pain Pain Assessment: 0-10 Pain Score: 10-Worst pain ever Pain Location: low back Pain Descriptors / Indicators: Aching Pain Intervention(s): Patient requesting pain meds-RN notified (pt reports he does not want tylenol)    Home Living                      Prior Function            PT Goals (current goals can now be found in the care plan section) Acute Rehab PT Goals Patient Stated Goal: to feel better Progress towards PT goals: Progressing toward goals    Frequency    Min 3X/week      PT Plan Current plan remains appropriate    Co-evaluation  AM-PAC PT "6 Clicks" Mobility   Outcome Measure  Help needed turning from your back to your side while in a flat bed without using bedrails?: A Little Help needed moving from lying on your back to sitting on the side of a flat bed without using bedrails?: A Little Help needed moving to and from a bed to a chair (including a wheelchair)?: A Little Help needed standing up from a chair using your arms (e.g., wheelchair or bedside chair)?: A Little Help needed to walk in hospital room?: A Little Help needed climbing 3-5 steps with a railing? : A Little 6 Click Score: 18    End of Session   Activity Tolerance: Patient limited by pain Patient left: in chair;with call bell/phone within reach;with chair alarm set Nurse Communication:  Mobility status PT Visit Diagnosis: Pain;Muscle weakness (generalized) (M62.81);Difficulty in walking, not elsewhere classified (R26.2);Unsteadiness on feet (R26.81) Pain - part of body:  (back)     Time: 3300-7622 PT Time Calculation (min) (ACUTE ONLY): 18 min  Charges:  $Gait Training: 8-22 mins                     Arlyss Gandy, PT, DPT Acute Rehabilitation Pager: [phone]    Arlyss Gandy 10/02/2020, 10:37 AM

## 2020-10-02 NOTE — TOC Progression Note (Signed)
Transition of Care [facility]) - Progression Note    Patient Details  Name: Luis [name] MRN: [id number] Date of Birth: 1971-05-14  Transition of Care [facility]) CM/SW Contact  Leone Haven, RN Phone Number: 10/02/2020, 10:36 AM  Clinical Narrative:    NCM spoke with patient ,offered choice, he has no preference for [facility] agency, he will also need a rolling walker, he is ok with [employer] supplying this.  NCM made referral to [facility] with [employer] for walker.        Expected Discharge Plan and Services                                                 Social Determinants of Health (SDOH) Interventions    Readmission Risk Interventions No flowsheet data found.

## 2020-10-02 NOTE — Discharge Summary (Signed)
Family Medicine Teaching [facility] Discharge Summary  Patient name: Luis [name] Medical record number: [id number] Date of birth: 12/24/1970 Age: 50 y.o. Gender: male Date of Admission: 09/30/2020  Date of Discharge: 10/02/2020 Admitting Physician: Fayette Pho, MD  Primary Care Provider: Margot Ables, MD Consultants: Cardiology  Indication for Hospitalization: Chest pain  Discharge Diagnoses/Problem List:  Cocaine abuse Chest pain secondary to cocaine use Chronic systolic CHF Nonischemic cardiomyopathy CAD and NSTEMI s/p biventricular ICD  Insulin-dependent diabetes mellitus Alcohol abuse Tobacco abuse Chronic back pain GERD Hyperlipidemia Hypertension Depression, anxiety, PTSD OSA BPH Dizziness  Disposition: Home  Discharge Condition: Stable  Discharge Exam:  Blood pressure 122/64, pulse 60, temperature 97.8 F (36.6 C), temperature source Oral, resp. rate 18, height 5' 5.5" (1.664 m), weight 123 kg, SpO2 98 %.  Physical Exam: General: Awake, alert, no acute distress Cardiovascular: Regular rate and rhythm, no murmur appreciated Respiratory: CTA B Extremities: Moving all spontaneously  Brief Hospital Course:  [name] is a 50 year old male who presented with chest pain.  PMH significant for IDDM, HTN, CAD s/p biventricular ICD, cocaine abuse, HFrEF, major depression, anxiety, GERD, sleep apnea, history of Covid December 2021.  Chest Pain  ACS Rule Out  Patient initially reported chest pain radiating to his back. CTA chest and abdomen was negative for aortic dissection and pulmonary embolism. EKG had no signs of ischemia and troponins were 17 and 15.  Echo unchanged from your prior.  Cardiology was consulted and recommended cessation of cocaine and no further follow-up. Patient was found to be positive for cocaine on admission UDS so his home metroprolol was temporarily changed to Coreg 6.25mg  BID. Due to patient issues with having  pre-packaged medications and cardiology not recommending beta blocker agent change; he was discharged on home dose of metoprolol. Echo showed EF 30-35%, global hypokinesis, LVH, Grd II DD.   Diabetes, Type 2  Hgb A1c was 10.2 on admission. His blood glucose was monitored, glucose measurements stable on Lantus 30 daily.  Suspect noncompliance at home.  Discharged on NovoLog 70/30 20 units twice daily.  PCP to follow-up make changes.   Issues for Follow Up:  1. Metoprolol use with concurrent cocaine abuse is risky, but cardiology fine with continuing. Patient gets blister packs from mail in pharmacy would  be very difficult to change.  Recommend PCP consider change to carvedilol. 2. Urine gonorrhea and chlamydia pending in process at time of discharge.  PCP to follow-up. 3. Routine follow-up with cardiology, no urgent consult needed. 4. Patient needs to stop using cocaine.  Please help connect with cessation resources. 5. Hemoglobin A1c 10.2 on admission, patient reports being out of insulin "for a while".  Says he went on vacation and put "all of his insulin" in his carry on bag, which then got lost.  By the time the bag got back to him, all the insulin was expired.  Significant Procedures: None  Significant Labs and Imaging:  Recent Labs  Lab 09/30/20 1142 10/01/20 0435  WBC 8.8 10.4  HGB 13.3 13.9  HCT 40.5 42.0  PLT 205 216   Recent Labs  Lab 09/30/20 1142 09/30/20 1744 10/01/20 0435  NA 137  --  134*  K 3.7  --  4.1  CL 103  --  100  CO2 27  --  25  GLUCOSE 191*  --  309*  BUN 5*  --  7  CREATININE 0.80  --  0.98  CALCIUM 8.5*  --  8.7*  MG  --  1.7  --   PHOS  --  3.4  --   ALKPHOS  --   --  37*  AST  --   --  17  ALT  --   --  17  ALBUMIN  --   --  3.0*    PORTABLE CHEST 1 VIEW 09/30/2020 COMPARISON:  December of 2022 FINDINGS: LEFT-sided 3 lead pacer defibrillator in place as before. Trachea midline. Cardiac enlargement as before. Lungs are clear. Limited  skeletal assessment is unremarkable. IMPRESSION: No active disease.  CT ANGIOGRAPHY CHEST WITH CONTRAST 09/30/2020  TECHNIQUE: Multidetector CT imaging of the chest was performed using the standard protocol during bolus administration of intravenous contrast. Multiplanar CT image reconstructions and MIPs were obtained to evaluate the vascular anatomy.  CONTRAST:  OMNIPAQUE IOHEXOL 350 MG/ML SOLN  COMPARISON:  Chest radiograph dated 09/30/2020.  FINDINGS: Cardiovascular: There is mild cardiomegaly. No pericardial effusion. Left pectoral AICD device. The thoracic aorta is unremarkable. The origins of the great vessels of the aortic arch appear patent as visualized. Evaluation of the pulmonary arteries is limited due to respiratory motion artifact and suboptimal opacification and timing of the contrast. No large or central pulmonary artery embolus identified.  Mediastinum/Nodes: There is no hilar or mediastinal adenopathy. The esophagus and the thyroid gland are grossly unremarkable. No mediastinal fluid collection.  Lungs/Pleura: The lungs are clear. There is no pleural effusion pneumothorax. The central airways are patent.  Upper Abdomen: No acute abnormality.  Musculoskeletal: Degenerative changes of the spine. No acute osseous pathology.  Review of the MIP images confirms the above findings.  IMPRESSION: 1. No acute intrathoracic pathology. No CT evidence of central pulmonary artery embolus. 2. Mild cardiomegaly.  Results/Tests Pending at Time of Discharge: Urine gonorrhea and chlamydia pending in process at time of discharge.  PCP to follow-up.  Discharge Medications:  Allergies as of 10/02/2020      Reactions   Aspirin Anaphylaxis   Throat closing   Iodine-131 Hives   Hives   Iodinated Diagnostic Agents Hives   Latex Rash, Hives   Tramadol Hives, Nausea Only   Penicillins Nausea And Vomiting   Amoxicillin-pot Clavulanate Diarrhea   Lidocaine  Rash   Lisinopril Cough      Medication List    STOP taking these medications   fluconazole 150 MG tablet Commonly known as: DIFLUCAN   oxyCODONE-acetaminophen 5-325 MG tablet Commonly known as: PERCOCET/ROXICET     TAKE these medications   carvedilol 6.25 MG tablet Commonly known as: Coreg Take 1 tablet (6.25 mg total) by mouth 2 (two) times daily.   clopidogrel 75 MG tablet Commonly known as: PLAVIX Take 75 mg by mouth daily.   clotrimazole-betamethasone cream Commonly known as: LOTRISONE Apply 1 a small amount to affected area twice a day   divalproex 500 MG DR tablet Commonly known as: DEPAKOTE Take 500 mg by mouth 2 (two) times a day.   furosemide 80 MG tablet Commonly known as: LASIX Take 80 mg by mouth daily.   HumaLOG Mix 75/25 KwikPen (75-25) 100 UNIT/ML Kwikpen Generic drug: Insulin Lispro Prot & Lispro Inject 20 Units into the skin 2 (two) times daily before a meal. What changed:   how much to take  when to take this   isosorbide mononitrate 30 MG 24 hr tablet Commonly known as: IMDUR Take 1 tablet (30 mg total) by mouth daily.   meclizine 25 MG tablet Commonly known as: ANTIVERT Take 1 tablet (25 mg total) by mouth 3 (three)  times daily as needed for dizziness.   metoprolol succinate 100 MG 24 hr tablet Commonly known as: TOPROL-XL Take 100 mg by mouth daily. Take with or immediately following a meal.   ondansetron 4 MG disintegrating tablet Commonly known as: Zofran ODT Take 1 tablet (4 mg total) by mouth every 8 (eight) hours as needed for nausea or vomiting.   pantoprazole 40 MG tablet Commonly known as: PROTONIX Take 1 tablet (40 mg total) by mouth 2 (two) times daily.   prazosin 2 MG capsule Commonly known as: MINIPRESS Take 2 mg by mouth at bedtime.   rosuvastatin 20 MG tablet Commonly known as: CRESTOR Take 20 mg by mouth daily.   sertraline 50 MG tablet Commonly known as: ZOLOFT Take 150 mg by mouth daily.   tamsulosin  0.4 MG Caps capsule Commonly known as: Flomax Take 1 capsule (0.4 mg total) by mouth daily after supper.   traZODone 100 MG tablet Commonly known as: DESYREL Take 200 mg by mouth at bedtime.   Victoza 18 MG/3ML Sopn Generic drug: liraglutide Inject 1.8 mg into the skin daily.            Durable Medical Equipment  (From admission, onward)         Start     Ordered   10/02/20 1035  For home use only DME Walker rolling  Once       Question Answer Comment  Walker: With 5 Inch Wheels   Patient needs a walker to treat with the following condition Weakness      10/02/20 1035          Discharge Instructions: Please refer to Patient Instructions section of EMR for full details.  Patient was counseled important signs and symptoms that should prompt return to medical care, changes in medications, dietary instructions, activity restrictions, and follow up appointments.   Follow-Up Appointments:  Follow-up Information    Wille Glaser, MD Follow up on 10/24/2020.   Specialty: Cardiology Why: 3:30 pm  [facility] [address] information: [address] [place] [address] [phone]        Margot Ables, MD. Schedule an appointment as soon as possible for a visit in 1 week(s).   Specialty: Family Medicine Why: Make a hospital follow up appointment with your primary care doctor. It should be for about a week after discharge.  Contact information: [address] [place] [address] [phone]        Marykay Lex, MD .   Specialty: Cardiology Contact information: [address] [place] [address] [phone]               Fayette Pho, MD 10/02/2020, 11:54 AM PGY-1, [facility] Family Medicine

## 2020-10-02 NOTE — Progress Notes (Signed)
During preparation for discharge, patient reported that he would need his prescription sent to a different pharmacy as his insulin and Victoza supply were improperly stored while on vacation.  Insurance will not cover for recently refill prescription. Patient prescribed Victoza and insulin.  Prescription initially sent to transitions of care pharmacy here at [facility], new prescription sent to [facility].  [place].  Ronnald Ramp, MD  [facility] Service, PGY-2  FPTS Intern Pager [phone]

## 2020-10-02 NOTE — Discharge Instructions (Signed)
Dear [name],  Thank you for letting us participate in your care. You were hospitalized for chest pain and diagnosed with cocaine-associated angina (chest pain).   Chest Pain  Your tests (EKG and troponin labs for heart stress) were negative for a heart attack.   Your CT imaging test of your lungs and abdomen was negative for blood clots in your lungs or a tear of the aorta in your chest or abdomen.   The heart ultrasound (ECHO) showed your heart is pumping the same as it did when you got this same test one year ago (still pumping somewhat weaker than a normal heart, but no worse than a year ago).   The cardiologist checked your implanted pacemaker/defibrillator and found it to be functioning normally without any record of dysfunction or misfire.   Your urine drug screen was positive for cocaine.   Given all of the above negative tests for significant heart, blood vessel, or lung source of your pain, along with the urine drug screen positive for cocaine, makes cocaine abuse the most likely explanation for your chest pain.   Penile Complaints At admission, you reported a "tingling discomfort" at the head of your penis and asked for STD testing. The blood tests for both HIV and syphilis came back negative. Unfortunately, the urine test for chlamydia and gonorrhea is still in process in our lab. Your primary care doctor will follow up on this result and let you know when it comes back.   POST-HOSPITAL & CARE INSTRUCTIONS 1. Do not use cocaine any more. This is very dangerous with your current heart conditions and could kill you.  2. Ask your primary care doctor to follow up on the urine test results for gonorrhea and chlamydia.  3. Go to your follow up appointments (listed below)   DOCTOR'S APPOINTMENT   No future appointments.  Follow-up Information    Wille Glaser, MD Follow up on 10/24/2020.   Specialty: Cardiology Why: 3:30 pm  [facility]  [address] information: [address] [place] [address] [phone]        Margot Ables, MD. Schedule an appointment as soon as possible for a visit in 1 week(s).   Specialty: Family Medicine Why: Make a hospital follow up appointment with your primary care doctor. It should be for about a week after discharge.  Contact information: [address] [place] [address] [phone]        Marykay Lex, MD .   Specialty: Cardiology Contact information: [address] [place] [address] [phone]               Take care and be well!  Family Medicine Teaching Service Inpatient Team [place]  [facility]  [address] [place], [place] [address] [phone]

## 2020-10-02 NOTE — TOC Transition Note (Signed)
Transition of Care [facility]) - CM/SW Discharge Note   Patient Details  Name: [name] MRN: [id number] Date of Birth: [birth year]  Transition [facility]) CM/SW Contact:  Leone Haven, RN Phone Number: 10/02/2020, 12:47 PM   Clinical Narrative:    Patient is for dc today, he is set up with [facility] for HHPT, HHOT.  Soc will begin 24 to 48 hrs post dc.  He will also need a rolling walker, NCM made referral to [facility] with [employer], this will be delivered to room prior to dc.    Final next level of care: Home w Home Health Services Barriers to Discharge: No Barriers Identified   Patient Goals and CMS Choice Patient states their goals for this hospitalization and ongoing recovery are:: home with [facility] CMS Medicare.gov Compare Post Acute Care list provided to:: Patient Choice offered to / list presented to : Patient  Discharge Placement                       Discharge Plan and Services                DME Arranged: Walker rolling DME Agency: [employer] Date DME Agency Contacted: 10/02/20 Time DME Agency Contacted: 1247 Representative spoke with at DME Agency: Silvio Pate HH Arranged: PT,OT HH Agency: [facility] Date [facility] Agency Contacted: 10/02/20 Time HH Agency Contacted: 1247 Representative spoke with at [facility] Agency: Kandee Keen  Social Determinants of Health (SDOH) Interventions     Readmission Risk Interventions No flowsheet data found.

## 2020-10-02 NOTE — Plan of Care (Signed)

## 2020-10-03 ENCOUNTER — Other Ambulatory Visit (HOSPITAL_COMMUNITY): Payer: Self-pay

## 2020-10-05 ENCOUNTER — Encounter: Payer: Self-pay | Admitting: Family Medicine

## 2020-10-05 NOTE — Progress Notes (Signed)
Letter for [facility] urine results.

## 2020-10-09 ENCOUNTER — Other Ambulatory Visit: Payer: Self-pay

## 2020-10-09 ENCOUNTER — Emergency Department (HOSPITAL_COMMUNITY): Payer: Medicare Other

## 2020-10-09 ENCOUNTER — Observation Stay (HOSPITAL_COMMUNITY)
Admission: EM | Admit: 2020-10-09 | Discharge: 2020-10-11 | Disposition: A | Discharge status: Home / Self Care | Payer: Medicare Other | Attending: Family Medicine | Admitting: Family Medicine

## 2020-10-09 ENCOUNTER — Encounter (HOSPITAL_COMMUNITY): Payer: Self-pay | Admitting: Student

## 2020-10-09 DIAGNOSIS — Z79899 Other long term (current) drug therapy: Secondary | ICD-10-CM | POA: Insufficient documentation

## 2020-10-09 DIAGNOSIS — Z91040 Latex allergy status: Secondary | ICD-10-CM | POA: Diagnosis not present

## 2020-10-09 DIAGNOSIS — I5022 Chronic systolic (congestive) heart failure: Secondary | ICD-10-CM | POA: Insufficient documentation

## 2020-10-09 DIAGNOSIS — F17210 Nicotine dependence, cigarettes, uncomplicated: Secondary | ICD-10-CM | POA: Insufficient documentation

## 2020-10-09 DIAGNOSIS — Z7902 Long term (current) use of antithrombotics/antiplatelets: Secondary | ICD-10-CM | POA: Insufficient documentation

## 2020-10-09 DIAGNOSIS — G459 Transient cerebral ischemic attack, unspecified: Principal | ICD-10-CM | POA: Insufficient documentation

## 2020-10-09 DIAGNOSIS — Z95810 Presence of automatic (implantable) cardiac defibrillator: Secondary | ICD-10-CM | POA: Diagnosis not present

## 2020-10-09 DIAGNOSIS — I110 Hypertensive heart disease with heart failure: Secondary | ICD-10-CM | POA: Insufficient documentation

## 2020-10-09 DIAGNOSIS — R531 Weakness: Secondary | ICD-10-CM | POA: Diagnosis not present

## 2020-10-09 DIAGNOSIS — I2510 Atherosclerotic heart disease of native coronary artery without angina pectoris: Secondary | ICD-10-CM | POA: Diagnosis not present

## 2020-10-09 DIAGNOSIS — R42 Dizziness and giddiness: Secondary | ICD-10-CM

## 2020-10-09 DIAGNOSIS — Z20822 Contact with and (suspected) exposure to covid-19: Secondary | ICD-10-CM | POA: Insufficient documentation

## 2020-10-09 DIAGNOSIS — Z794 Long term (current) use of insulin: Secondary | ICD-10-CM | POA: Diagnosis not present

## 2020-10-09 DIAGNOSIS — E119 Type 2 diabetes mellitus without complications: Secondary | ICD-10-CM | POA: Diagnosis not present

## 2020-10-09 LAB — BASIC METABOLIC PANEL
Anion gap: 8 (ref 5–15)
BUN: 12 mg/dL (ref 6–20)
CO2: 26 mmol/L (ref 22–32)
Calcium: 8.8 mg/dL — ABNORMAL LOW (ref 8.9–10.3)
Chloride: 105 mmol/L (ref 98–111)
Creatinine, Ser: 0.94 mg/dL (ref 0.61–1.24)
GFR, Estimated: >60 mL/min (ref >60)
Glucose, Bld: 207 mg/dL — ABNORMAL HIGH (ref 70–99)
Potassium: 4.4 mmol/L (ref 3.5–5.1)
Sodium: 139 mmol/L (ref 135–145)

## 2020-10-09 LAB — URINALYSIS, ROUTINE W REFLEX MICROSCOPIC
Bilirubin Urine: NEGATIVE
Glucose, UA: NEGATIVE mg/dL
Hgb urine dipstick: NEGATIVE
Ketones, ur: 5 mg/dL — AB
Leukocytes,Ua: NEGATIVE
Nitrite: NEGATIVE
Protein, ur: NEGATIVE mg/dL
Specific Gravity, Urine: 1.016 (ref 1.005–1.030)
pH: 5 (ref 5.0–8.0)

## 2020-10-09 LAB — VALPROIC ACID LEVEL: Valproic Acid Lvl: 32 ug/mL — ABNORMAL LOW (ref 50.0–100.0)

## 2020-10-09 LAB — RAPID URINE DRUG SCREEN, HOSP PERFORMED
Amphetamines: NOT DETECTED
Barbiturates: NOT DETECTED
Benzodiazepines: NOT DETECTED
Cocaine: POSITIVE — AB
Opiates: NOT DETECTED
Tetrahydrocannabinol: NOT DETECTED

## 2020-10-09 LAB — CBC
HCT: 45.3 % (ref 39.0–52.0)
Hemoglobin: 15.1 g/dL (ref 13.0–17.0)
MCH: 29.9 pg (ref 26.0–34.0)
MCHC: 33.3 g/dL (ref 30.0–36.0)
MCV: 89.7 fL (ref 80.0–100.0)
Platelets: 213 10*3/uL (ref 150–400)
RBC: 5.05 MIL/uL (ref 4.22–5.81)
RDW: 13 % (ref 11.5–15.5)
WBC: 10.4 10*3/uL (ref 4.0–10.5)
nRBC: 0 % (ref 0.0–0.2)

## 2020-10-09 LAB — RESP PANEL BY RT-PCR (FLU A&B, COVID) ARPGX2
Influenza A by PCR: NEGATIVE
Influenza B by PCR: NEGATIVE
SARS Coronavirus 2 by RT PCR: NEGATIVE

## 2020-10-09 LAB — GLUCOSE, CAPILLARY: Glucose-Capillary: 230 mg/dL — ABNORMAL HIGH (ref 70–99)

## 2020-10-09 LAB — CBG MONITORING, ED: Glucose-Capillary: 200 mg/dL — ABNORMAL HIGH (ref 70–99)

## 2020-10-09 MED ORDER — ACETAMINOPHEN 160 MG/5ML PO SOLN: 650.0000 mg | ORAL | Status: DC | PRN

## 2020-10-09 MED ORDER — DIVALPROEX SODIUM 250 MG PO DR TAB
500.0000 mg | DELAYED_RELEASE_TABLET | Freq: Two times a day (BID) | ORAL | Status: DC
  Administered 2020-10-10 – 2020-10-11 (×4): 500 mg via ORAL
  Filled 2020-10-09 (×4): qty 2

## 2020-10-09 MED ORDER — SENNOSIDES-DOCUSATE SODIUM 8.6-50 MG PO TABS: 1.0000 | ORAL_TABLET | Freq: Every evening | ORAL | Status: DC | PRN

## 2020-10-09 MED ORDER — CLOPIDOGREL BISULFATE 75 MG PO TABS
75.0000 mg | ORAL_TABLET | Freq: Every day | ORAL | Status: DC
  Administered 2020-10-10 – 2020-10-11 (×2): 75 mg via ORAL
  Filled 2020-10-09 (×2): qty 1

## 2020-10-09 MED ORDER — METOCLOPRAMIDE HCL 5 MG/ML IJ SOLN
10.0000 mg | Freq: Once | INTRAMUSCULAR | Status: AC
  Administered 2020-10-09: 10 mg via INTRAVENOUS
  Filled 2020-10-09: qty 2

## 2020-10-09 MED ORDER — ACETAMINOPHEN 325 MG PO TABS
650.0000 mg | ORAL_TABLET | ORAL | Status: DC | PRN
  Administered 2020-10-10: 650 mg via ORAL
  Filled 2020-10-09 (×2): qty 2

## 2020-10-09 MED ORDER — ROSUVASTATIN CALCIUM 20 MG PO TABS
20.0000 mg | ORAL_TABLET | Freq: Every day | ORAL | Status: DC
  Administered 2020-10-10 – 2020-10-11 (×2): 20 mg via ORAL
  Filled 2020-10-09 (×2): qty 1

## 2020-10-09 MED ORDER — INSULIN ASPART 100 UNIT/ML ~~LOC~~ SOLN
0.0000 [IU] | Freq: Three times a day (TID) | SUBCUTANEOUS | Status: DC
  Administered 2020-10-10 (×2): 7 [IU] via SUBCUTANEOUS
  Administered 2020-10-10 – 2020-10-11 (×4): 4 [IU] via SUBCUTANEOUS

## 2020-10-09 MED ORDER — PANTOPRAZOLE SODIUM 40 MG PO TBEC
40.0000 mg | DELAYED_RELEASE_TABLET | Freq: Every day | ORAL | Status: DC
  Administered 2020-10-10 – 2020-10-11 (×2): 40 mg via ORAL
  Filled 2020-10-09 (×2): qty 1

## 2020-10-09 MED ORDER — DIPHENHYDRAMINE HCL 50 MG/ML IJ SOLN
25.0000 mg | Freq: Once | INTRAMUSCULAR | Status: AC
  Administered 2020-10-09: 25 mg via INTRAVENOUS
  Filled 2020-10-09: qty 1

## 2020-10-09 MED ORDER — MECLIZINE HCL 25 MG PO TABS
50.0000 mg | ORAL_TABLET | Freq: Once | ORAL | Status: AC
  Administered 2020-10-09: 50 mg via ORAL
  Filled 2020-10-09: qty 2

## 2020-10-09 MED ORDER — INSULIN ASPART 100 UNIT/ML ~~LOC~~ SOLN
0.0000 [IU] | Freq: Every day | SUBCUTANEOUS | Status: DC
  Administered 2020-10-09: 2 [IU] via SUBCUTANEOUS

## 2020-10-09 MED ORDER — ACETAMINOPHEN 650 MG RE SUPP: 650.0000 mg | RECTAL | Status: DC | PRN

## 2020-10-09 MED ORDER — SODIUM CHLORIDE 0.9 % IV SOLN: Freq: Once | INTRAVENOUS | Status: AC

## 2020-10-09 MED ORDER — MECLIZINE HCL 12.5 MG PO TABS
25.0000 mg | ORAL_TABLET | Freq: Three times a day (TID) | ORAL | Status: DC | PRN
  Administered 2020-10-10 (×2): 25 mg via ORAL
  Filled 2020-10-09 (×2): qty 2

## 2020-10-09 MED ORDER — STROKE: EARLY STAGES OF RECOVERY BOOK
Freq: Once | Status: AC
  Filled 2020-10-09: qty 1

## 2020-10-09 NOTE — ED Triage Notes (Signed)
Patient BIB [facility] c/o dizziness X2 days and chronic back pain from MD office.  Patient refused to be transported to [facility]. EMS placed 18g on left AC.  Patient has an ICD.  Vitals were 150/68 61-HR 18-RR  243-CBG 95% on room air

## 2020-10-09 NOTE — H&P (Signed)
History and Physical    NASER SCHULD IOE:[id number] DOB: 08-10-1970 DOA: 10/09/2020  PCP: Margot Ables, MD  Patient coming from: Home  Chief Complaint: dizziness, right side paraesthesia/weakness   HPI: Luis [name] is a 50 y.o. male with medical history significant of DM2, HTN, HLD. Presenting with dizziness and right side weakness, paraesthesia. Recently discharged from family medicine resident service after a stay for chest pain secondary to cocaine abuse. He reports that he was ok for several days after discharge. However, he noticed symptoms 2 days ago. He found it difficult to hold a coke bottle with his right hand. Shortly after, he noticed the room spinning. He states he has that room-spinning sensation chronically, so he ignored it all. However, his weakness continued into the next day. It progressed to include his right leg. He also noticed tingling in his arm and leg. He spoke to his PCP about his symptoms. He was advised to come to the ED. He denies any other aggravating or alleviating factors.     ED Course: CTH was negative. He was found to be cocaine positive. He was found to be hyperglycemia. Neuro was consulted. TRH was called for admission.    Review of Systems:  Denies CP, dyspnea, palpitations, N/V/D, fever. Reports dizziness Review of systems is otherwise negative for all not mentioned in HPI.   PMHx Past Medical History:  Diagnosis Date  . Anxiety   . Biventricular ICD (implantable cardioverter-defibrillator) in place   . Chronic systolic CHF (congestive heart failure) (HCC)   . Cocaine use   . Diabetes mellitus without complication (HCC)   . GERD (gastroesophageal reflux disease)   . HLD (hyperlipidemia)   . Hypertension   . Morbid obesity (HCC)   . NICM (nonischemic cardiomyopathy) (HCC)   . OSA (obstructive sleep apnea)   . PTSD (post-traumatic stress disorder)    on Depakote  . Refusal of blood transfusions as patient is Jehovah's  Witness   . Tobacco abuse     PSHx Past Surgical History:  Procedure Laterality Date  . BIV ICD INSERTION CRT-D    . FOOT FRACTURE SURGERY    . ROTATOR CUFF REPAIR    . TONSILLECTOMY      SocHx  reports that he has been smoking cigarettes. He has never used smokeless tobacco. He reports previous alcohol use. He reports current drug use. Drug: Cocaine.  Allergies  Allergen Reactions  . Aspirin Anaphylaxis    Throat closing   . Iodine-131 Hives    Hives  . Iodinated Diagnostic Agents Hives  . Latex Rash and Hives  . Tramadol Hives and Nausea Only  . Penicillins Nausea And Vomiting  . Amoxicillin-Pot Clavulanate Diarrhea  . Lidocaine Rash  . Lisinopril Cough    FamHx Family History  Problem Relation Age of Onset  . Hypertension Mother   . Bone cancer Father   . Lung cancer Father     Prior to Admission medications   Medication Sig Start Date End Date Taking? Authorizing Provider  carvedilol (COREG) 6.25 MG tablet Take 6.25 mg by mouth 2 (two) times daily with a meal.   Yes [provider]  clopidogrel (PLAVIX) 75 MG tablet Take 75 mg by mouth daily. 01/13/19  Yes [provider]  clotrimazole-betamethasone (LOTRISONE) cream Apply 1 application topically daily. 09/03/20  Yes [provider]  divalproex (DEPAKOTE) 500 MG DR tablet Take 500 mg by mouth 2 (two) times a day. 06/30/18  Yes [provider]  furosemide (LASIX)  80 MG tablet Take 80 mg by mouth daily. 09/13/18  Yes [provider]  HUMALOG MIX 75/25 KWIKPEN (75-25) 100 UNIT/ML Kwikpen Inject 20 Units into the skin 2 (two) times daily before a meal. Patient taking differently: Inject 60 Units into the skin 3 (three) times daily. 10/02/20  Yes Littie Deeds, MD  metoprolol succinate (TOPROL-XL) 100 MG 24 hr tablet Take 100 mg by mouth daily. Take with or immediately following a meal.   Yes [provider]  Multiple Vitamin (MULTIVITAMIN WITH MINERALS) TABS tablet Take 1  tablet by mouth daily.   Yes [provider]  pantoprazole (PROTONIX) 40 MG tablet Take 1 tablet (40 mg total) by mouth 2 (two) times daily. Patient taking differently: Take 40 mg by mouth daily. 07/27/19  Yes Vann, Jessica U, DO  prazosin (MINIPRESS) 2 MG capsule Take 2 mg by mouth at bedtime. 10/28/18  Yes [provider]  rosuvastatin (CRESTOR) 20 MG tablet Take 20 mg by mouth daily. 09/21/18  Yes [provider]  sertraline (ZOLOFT) 50 MG tablet Take 100 mg by mouth daily. 10/28/18  Yes [provider]  traZODone (DESYREL) 100 MG tablet Take 200 mg by mouth at bedtime.  12/19/18  Yes [provider]  VICTOZA 18 MG/3ML SOPN Inject 1.8 mg into the skin daily. 10/02/20 11/01/20 Yes Simmons-Robinson, Makiera, MD  isosorbide mononitrate (IMDUR) 30 MG 24 hr tablet Take 1 tablet (30 mg total) by mouth daily. Patient not taking: No sig reported 07/28/19   Gwenyth Bender, NP  meclizine (ANTIVERT) 25 MG tablet Take 1 tablet (25 mg total) by mouth 3 (three) times daily as needed for dizziness. Patient not taking: Reported on 10/09/2020 06/26/20   Alvira Monday, MD  ondansetron (ZOFRAN ODT) 4 MG disintegrating tablet Take 1 tablet (4 mg total) by mouth every 8 (eight) hours as needed for nausea or vomiting. Patient not taking: Reported on 10/09/2020 06/26/20   Alvira Monday, MD  tamsulosin (FLOMAX) 0.4 MG CAPS capsule Take 1 capsule (0.4 mg total) by mouth daily after supper. Patient not taking: Reported on 10/09/2020 07/27/20   Gwyneth Sprout, MD    Physical Exam: Vitals:   10/09/20 1359 10/09/20 1400 10/09/20 1550 10/09/20 1551  BP: (!) 153/84  132/69   Pulse: 60 61 (!) 59 60  Resp: (!) 9 16 12 13   Temp:      SpO2: 94% 94% 97% 94%  Weight:      Height:        General: 50 y.o. male resting in bed in NAD Eyes: PERRL, normal sclera ENMT: Nares patent w/o discharge, orophaynx clear, dentition normal, ears w/o discharge/lesions/ulcers Neck: Supple, trachea  midline Cardiovascular: RRR, +S1, S2, no m/g/r, equal pulses throughout Respiratory: CTABL, no w/r/r, normal WOB GI: BS+, NDNT, no masses noted, no organomegaly noted MSK: No e/c/c Skin: No rashes, bruises, ulcerations noted Neuro: A&O x 3, RLE MSK strg is 4-5/5; reports decreased sensation medial lower right leg Psyc: Odd affect, evasive but cooperative w/ exam  Labs on Admission: I have personally reviewed following labs and imaging studies  CBC: Recent Labs  Lab 10/09/20 1223  WBC 10.4  HGB 15.1  HCT 45.3  MCV 89.7  PLT 213   Basic Metabolic Panel: Recent Labs  Lab 10/09/20 1223  NA 139  K 4.4  CL 105  CO2 26  GLUCOSE 207*  BUN 12  CREATININE 0.94  CALCIUM 8.8*   GFR: Estimated Creatinine Clearance: 114.1 mL/min (by C-G formula based on  SCr of 0.94 mg/dL). Liver Function Tests: No results for input(s): AST, ALT, ALKPHOS, BILITOT, PROT, ALBUMIN in the last 168 hours. No results for input(s): LIPASE, AMYLASE in the last 168 hours. No results for input(s): AMMONIA in the last 168 hours. Coagulation Profile: No results for input(s): INR, PROTIME in the last 168 hours. Cardiac Enzymes: No results for input(s): CKTOTAL, CKMB, CKMBINDEX, TROPONINI in the last 168 hours. BNP (last 3 results) No results for input(s): PROBNP in the last 8760 hours. HbA1C: No results for input(s): HGBA1C in the last 72 hours. CBG: Recent Labs  Lab 10/09/20 1251  GLUCAP 200*   Lipid Profile: No results for input(s): CHOL, HDL, LDLCALC, TRIG, CHOLHDL, LDLDIRECT in the last 72 hours. Thyroid Function Tests: No results for input(s): TSH, T4TOTAL, FREET4, T3FREE, THYROIDAB in the last 72 hours. Anemia Panel: No results for input(s): VITAMINB12, FOLATE, FERRITIN, TIBC, IRON, RETICCTPCT in the last 72 hours. Urine analysis:    Component Value Date/Time   COLORURINE YELLOW 10/09/2020 1400   APPEARANCEUR CLEAR 10/09/2020 1400   LABSPEC 1.016 10/09/2020 1400   PHURINE 5.0 10/09/2020  1400   GLUCOSEU NEGATIVE 10/09/2020 1400   HGBUR NEGATIVE 10/09/2020 1400   BILIRUBINUR NEGATIVE 10/09/2020 1400   KETONESUR 5 (A) 10/09/2020 1400   PROTEINUR NEGATIVE 10/09/2020 1400   NITRITE NEGATIVE 10/09/2020 1400   LEUKOCYTESUR NEGATIVE 10/09/2020 1400    Radiological Exams on Admission: CT Head Wo Contrast  Result Date: 10/09/2020 CLINICAL DATA:  Dizziness, right-sided weakness. EXAM: CT HEAD WITHOUT CONTRAST TECHNIQUE: Contiguous axial images were obtained from the base of the skull through the vertex without intravenous contrast. COMPARISON:  January 17, 2019. FINDINGS: Brain: No evidence of acute infarction, hemorrhage, hydrocephalus, extra-axial collection or mass lesion/mass effect. Vascular: No hyperdense vessel or unexpected calcification. Skull: Normal. Negative for fracture or focal lesion. Sinuses/Orbits: No acute finding. Other: None. IMPRESSION: Normal head CT. Electronically Signed   By: Lupita Raider M.D.   On: 10/09/2020 13:37    EKG: Independently reviewed. Pacing noted, LBBB noted as in previous EKG  Assessment/Plan Right side weakness and paraesthesia     - admit to obs, tele @ [facility] for TIA v Stroke w/u     - Neuro consulted (Dr. Amada Jupiter); will see at [facility]     - patient with pacer, will need MRI at [facility]     - has IV contrast allergy; If pacer is MRI appropriate, can order MRA for vascular w/u; otherwise, will need IV contrast allergy protocol     - Echo on 10/01/20 w/o clot, vegetation noted     - NPO except sips/meds for now, get bedside swallow     - PT/OT/SLP     - permissive HTN     - A1c on 09/30/20 was 10.2, check lipids  IDDM     - A1c as above     - SSI, NPO except sips/meds for now, glucose checks  Chronic combined CHF Nonischemic cardiomyopathy     - home home regimen for now     - Echo on 10/01/20 w/o clot, vegetation noted  HTN     - all for permissive HTN for now  HLD     - continue statin  Depression/Anxiety     - continue home  regimen  BPH     - hold home regimen for now  Cocaine abuse Tobacco abuse EtOH abuse     - counseled against further use; he reports that he is cocaine positive because he "  touched it once".  DVT prophylaxis: SCDs  Code Status: FULL  Family Communication: None at bedside.  Consults called: EDP sidebarred neuro.    Status is: Observation  The patient remains OBS appropriate and will d/c before 2 midnights.  Dispo: The patient is from: Home              Anticipated d/c is to: Home              Patient currently is medically stable to d/c.   Difficult to place patient No  Teddy Spike DO [facility]  If 7PM-7AM, please contact night-coverage [email or web address]  10/09/2020, 4:16 PM

## 2020-10-09 NOTE — ED Notes (Signed)
ED TO INPATIENT HANDOFF REPORT  Name/Age/Gender Luis [name] 50 y.o. male  Code Status Code Status History    Date Active Date Inactive Code Status Order ID Comments User Context   09/30/2020 1600 10/02/2020 1932 Full Code [id number]  Fayette Pho, MD ED   09/05/2019 2337 09/07/2019 2302 Full Code [id number]  Zadie Rhine, MD ED   07/26/2019 0750 07/27/2019 1744 Full Code [id number]  [name], MD ED   05/04/2019 1435 05/07/2019 1448 Full Code [id number]  Bridget Hartshorn, DO ED   01/18/2019 0051 01/20/2019 1631 Full Code [id number]  Lorretta Harp, MD Inpatient   Advance Care Planning Activity    Questions for Most Recent Historical Code Status (Order [id number])       Home/SNF/Other Home   Chief Complaint TIA (transient ischemic attack) [G45.9]  Level of Care/Admitting Diagnosis ED Disposition    ED Disposition Condition Comment   Admit  Hospital Area: [facility] [[id number]]  Level of Care: Telemetry Medical [104]  I expect the patient will be discharged within 24 hours: No (not a candidate for 5C-Observation unit)  Covid Evaluation: Asymptomatic Screening Protocol (No Symptoms)  Diagnosis: TIA (transient ischemic attack) [468032]  Admitting Physician: Teddy Spike [[id number]]  Attending Physician: Teddy Spike [[id number]]       Medical History Past Medical History:  Diagnosis Date  . Anxiety   . Biventricular ICD (implantable cardioverter-defibrillator) in place   . Chronic systolic CHF (congestive heart failure) (HCC)   . Cocaine use   . Diabetes mellitus without complication (HCC)   . GERD (gastroesophageal reflux disease)   . HLD (hyperlipidemia)   . Hypertension   . Morbid obesity (HCC)   . NICM (nonischemic cardiomyopathy) (HCC)   . OSA (obstructive sleep apnea)   . PTSD (post-traumatic stress disorder)    on Depakote  . Refusal of blood transfusions as patient is Jehovah's Witness   . Tobacco abuse     Allergies Allergies   Allergen Reactions  . Aspirin Anaphylaxis    Throat closing   . Iodine-131 Hives    Hives  . Iodinated Diagnostic Agents Hives  . Latex Rash and Hives  . Tramadol Hives and Nausea Only  . Penicillins Nausea And Vomiting  . Amoxicillin-Pot Clavulanate Diarrhea  . Lidocaine Rash  . Lisinopril Cough    IV Location/Drains/Wounds Patient Lines/Drains/Airways Status    Active Line/Drains/Airways    Name Placement date Placement time Site Days   Peripheral IV 10/09/20 Anterior;Left;Proximal Forearm 10/09/20  --  Forearm  less than 1          Labs/Imaging Results for orders placed or performed during the hospital encounter of 10/09/20 (from the past 48 hour(s))  Basic metabolic panel     Status: Abnormal   Collection Time: 10/09/20 12:23 PM  Result Value Ref Range   Sodium 139 135 - 145 mmol/L   Potassium 4.4 3.5 - 5.1 mmol/L   Chloride 105 98 - 111 mmol/L   CO2 26 22 - 32 mmol/L   Glucose, Bld 207 (H) 70 - 99 mg/dL    Comment: Glucose reference range applies only to samples taken after fasting for at least 8 hours.   BUN 12 6 - 20 mg/dL   Creatinine, Ser 8.89 0.61 - 1.24 mg/dL   Calcium 8.8 (L) 8.9 - 10.3 mg/dL   GFR, Estimated >16 >94 mL/min    Comment: (NOTE) Calculated using the CKD-EPI Creatinine Equation (2021)    Anion gap 8 5 -  15    Comment: Performed at [facility], [address], [place], [place] [address]  CBC     Status: None   Collection Time: 10/09/20 12:23 PM  Result Value Ref Range   WBC 10.4 4.0 - 10.5 K/uL   RBC 5.05 4.22 - 5.81 MIL/uL   Hemoglobin 15.1 13.0 - 17.0 g/dL   HCT 72.8 97.9 - 15.0 %   MCV 89.7 80.0 - 100.0 fL   MCH 29.9 26.0 - 34.0 pg   MCHC 33.3 30.0 - 36.0 g/dL   RDW 41.3 64.3 - 83.7 %   Platelets 213 150 - 400 K/uL   nRBC 0.0 0.0 - 0.2 %    Comment: Performed at [facility], [address], [place], [place] [address]  CBG monitoring, ED     Status: Abnormal   Collection Time: 10/09/20  12:51 PM  Result Value Ref Range   Glucose-Capillary 200 (H) 70 - 99 mg/dL    Comment: Glucose reference range applies only to samples taken after fasting for at least 8 hours.  Urinalysis, Routine w reflex microscopic     Status: Abnormal   Collection Time: 10/09/20  2:00 PM  Result Value Ref Range   Color, Urine YELLOW YELLOW   APPearance CLEAR CLEAR   Specific Gravity, Urine 1.016 1.005 - 1.030   pH 5.0 5.0 - 8.0   Glucose, UA NEGATIVE NEGATIVE mg/dL   Hgb urine dipstick NEGATIVE NEGATIVE   Bilirubin Urine NEGATIVE NEGATIVE   Ketones, ur 5 (A) NEGATIVE mg/dL   Protein, ur NEGATIVE NEGATIVE mg/dL   Nitrite NEGATIVE NEGATIVE   Leukocytes,Ua NEGATIVE NEGATIVE    Comment: Performed at [facility], [address] [place], [place] [address]  Rapid urine drug screen (hospital performed)     Status: Abnormal   Collection Time: 10/09/20  2:00 PM  Result Value Ref Range   Opiates NONE DETECTED NONE DETECTED   Cocaine POSITIVE (A) NONE DETECTED   Benzodiazepines NONE DETECTED NONE DETECTED   Amphetamines NONE DETECTED NONE DETECTED   Tetrahydrocannabinol NONE DETECTED NONE DETECTED   Barbiturates NONE DETECTED NONE DETECTED    Comment: (NOTE) DRUG SCREEN FOR MEDICAL PURPOSES ONLY.  IF CONFIRMATION IS NEEDED FOR ANY PURPOSE, NOTIFY LAB WITHIN 5 DAYS.  LOWEST DETECTABLE LIMITS FOR URINE DRUG SCREEN Drug Class                     Cutoff (ng/mL) Amphetamine and metabolites    1000 Barbiturate and metabolites    200 Benzodiazepine                 200 Tricyclics and metabolites     300 Opiates and metabolites        300 Cocaine and metabolites        300 THC                            50 Performed at [facility], [address], [place], [place] [address]   Valproic acid level     Status: Abnormal   Collection Time: 10/09/20  2:01 PM  Result Value Ref Range   Valproic Acid Lvl 32 (L) 50.0 - 100.0 ug/mL    Comment: Performed at [facility], [address] [place], [place] [address]  Resp Panel by RT-PCR (Flu A&B, Covid) Nasopharyngeal Swab     Status: None   Collection  Time: 10/09/20  3:55 PM   Specimen: Nasopharyngeal Swab; Nasopharyngeal(NP) swabs in vial transport medium  Result Value Ref Range   SARS Coronavirus 2 by RT PCR NEGATIVE NEGATIVE    Comment: (NOTE) SARS-CoV-2 target nucleic acids are NOT DETECTED.  The SARS-CoV-2 RNA is generally detectable in upper respiratory specimens during the acute phase of infection. The lowest concentration of SARS-CoV-2 viral copies this assay can detect is 138 copies/mL. A negative result does not preclude SARS-Cov-2 infection and should not be used as the sole basis for treatment or other patient management decisions. A negative result may occur with  improper specimen collection/handling, submission of specimen other than nasopharyngeal swab, presence of viral mutation(s) within the areas targeted by this assay, and inadequate number of viral copies(<138 copies/mL). A negative result must be combined with clinical observations, patient history, and epidemiological information. The expected result is Negative.  Fact Sheet for Patients:  BloggerCourse.com  Fact Sheet for Healthcare Providers:  SeriousBroker.it  This test is no t yet approved or cleared by the [place] FDA and  has been authorized for detection and/or diagnosis of SARS-CoV-2 by FDA under an Emergency Use Authorization (EUA). This EUA will remain  in effect (meaning this test can be used) for the duration of the COVID-19 declaration under Section 564(b)(1) of the Act, 21 U.S.C.section 360bbb-3(b)(1), unless the authorization is terminated  or revoked sooner.       Influenza A by PCR NEGATIVE NEGATIVE   Influenza B by PCR NEGATIVE NEGATIVE    Comment: (NOTE) The Xpert Xpress SARS-CoV-2/FLU/RSV plus assay is intended as an aid in  the diagnosis of influenza from Nasopharyngeal swab specimens and should not be used as a sole basis for treatment. Nasal washings and aspirates are unacceptable for Xpert Xpress SARS-CoV-2/FLU/RSV testing.  Fact Sheet for Patients: BloggerCourse.com  Fact Sheet for Healthcare Providers: SeriousBroker.it  This test is not yet approved or cleared by the [place] FDA and has been authorized for detection and/or diagnosis of SARS-CoV-2 by FDA under an Emergency Use Authorization (EUA). This EUA will remain in effect (meaning this test can be used) for the duration of the COVID-19 declaration under Section 564(b)(1) of the Act, 21 U.S.C. section 360bbb-3(b)(1), unless the authorization is terminated or revoked.  Performed at [facility], [address] [place], [place] [address]    CT Head Wo Contrast  Result Date: 10/09/2020 CLINICAL DATA:  Dizziness, right-sided weakness. EXAM: CT HEAD WITHOUT CONTRAST TECHNIQUE: Contiguous axial images were obtained from the base of the skull through the vertex without intravenous contrast. COMPARISON:  January 17, 2019. FINDINGS: Brain: No evidence of acute infarction, hemorrhage, hydrocephalus, extra-axial collection or mass lesion/mass effect. Vascular: No hyperdense vessel or unexpected calcification. Skull: Normal. Negative for fracture or focal lesion. Sinuses/Orbits: No acute finding. Other: None. IMPRESSION: Normal head CT. Electronically Signed   By: Lupita Raider M.D.   On: 10/09/2020 13:37    Pending Labs Unresulted Labs (From admission, onward)          Start     Ordered   Signed and Held  Lipid panel  (Labs)  Tomorrow morning,   R       Comments: Fasting    Signed and Held          Vitals/Pain Today's Vitals   10/09/20 1700 10/09/20 1730 10/09/20 1800 10/09/20 1840  BP: 132/72 137/72 120/74 137/83  Pulse: 60 63 (!) 59 60  Resp: 14 16 17 12   Temp:  SpO2: 96% 91% 92% 95%  Weight:      Height:        Isolation Precautions No active isolations  Medications Medications  meclizine (ANTIVERT) tablet 50 mg (50 mg Oral Given 10/09/20 1403)  metoCLOPramide (REGLAN) injection 10 mg (10 mg Intravenous Given 10/09/20 1552)  diphenhydrAMINE (BENADRYL) injection 25 mg (25 mg Intravenous Given 10/09/20 1552)    Mobility Normally walks, new weakness

## 2020-10-09 NOTE — ED Provider Notes (Signed)
[name] Provider Note   CSN: [id number] Arrival date & time: 10/09/20  1202     History Chief Complaint  Patient presents with  . Dizziness    Luis [name] is a 50 y.o. male.  Patient with history of biventricular ICD/pacemaker due to systolic CHF, cocaine use, diabetes, obesity, chronic back pain --presents to the emergency department via PCP office today for evaluation of weakness and vertigo.  Patient states that his symptoms have been present for about 2 days.  He describes spinning sensation which is making it difficult for him to walk.  He also describes "pins-and-needles" sensation of his right leg, right arm, and right face with associated weakness in his right arm and right leg.  He states that he cannot open a soda can because of the weakness.  He has also dragging his right foot when he walks.  He denies facial droop, slurred speech, vision change or loss.  Patient went to his primary care doctor today for the symptoms and was brought to the ER by ambulance.  No treatments prior to arrival.  Patient also reports chronic pain in his neck and lower back.  He denies any difficulty with urination or with bowel movements but states that he has been eating and drinking less due to his symptoms over the past 2 days. The onset of this condition was acute. The course is constant. Aggravating factors: movement. Alleviating factors: none.          Past Medical History:  Diagnosis Date  . Anxiety   . Biventricular ICD (implantable cardioverter-defibrillator) in place   . Chronic systolic CHF (congestive heart failure) (HCC)   . Cocaine use   . Diabetes mellitus without complication (HCC)   . GERD (gastroesophageal reflux disease)   . HLD (hyperlipidemia)   . Hypertension   . Morbid obesity (HCC)   . NICM (nonischemic cardiomyopathy) (HCC)   . OSA (obstructive sleep apnea)   . PTSD (post-traumatic stress disorder)    on Depakote  .  Refusal of blood transfusions as patient is Jehovah's Witness   . Tobacco abuse     Patient Active Problem List   Diagnosis Date Noted  . Leukocytosis 07/26/2019  . Nonischemic cardiomyopathy (HCC)   . Acute respiratory failure with hypoxia (HCC) 05/05/2019  . Rhinovirus infection 05/05/2019  . Morbid obesity (HCC) 05/05/2019  . Suspected COVID-19 virus infection 05/05/2019  . Acute intractable headache   . Cocaine abuse (HCC)   . Acute non intractable tension-type headache   . Blurred vision, bilateral   . Chronic systolic CHF (congestive heart failure) (HCC) 01/17/2019  . Pacemaker   . HLD (hyperlipidemia)   . Diabetes mellitus without complication (HCC)   . Hypertension   . GERD (gastroesophageal reflux disease)   . Nonspecific chest pain   . Tobacco abuse   . Alcohol abuse     Past Surgical History:  Procedure Laterality Date  . BIV ICD INSERTION CRT-D    . FOOT FRACTURE SURGERY    . ROTATOR CUFF REPAIR    . TONSILLECTOMY         Family History  Problem Relation Age of Onset  . Hypertension Mother   . Bone cancer Father   . Lung cancer Father     Social History   Tobacco Use  . Smoking status: Current Every Day Smoker    Types: Cigarettes  . Smokeless tobacco: Never Used  . Tobacco comment: occasionally  Vaping Use  .  Vaping Use: Never used  Substance Use Topics  . Alcohol use: Not Currently  . Drug use: Yes    Types: Cocaine    Home Medications Prior to Admission medications   Medication Sig Start Date End Date Taking? Authorizing Provider  clopidogrel (PLAVIX) 75 MG tablet Take 75 mg by mouth daily. 01/13/19   [provider]  clotrimazole-betamethasone (LOTRISONE) cream Apply 1 a small amount to affected area twice a day 09/03/20   [provider]  divalproex (DEPAKOTE) 500 MG DR tablet Take 500 mg by mouth 2 (two) times a day. 06/30/18   [provider]  furosemide (LASIX) 80 MG tablet Take 80 mg by mouth daily. 09/13/18    [provider]  HUMALOG MIX 75/25 KWIKPEN (75-25) 100 UNIT/ML Kwikpen Inject 20 Units into the skin 2 (two) times daily before a meal. 10/02/20   Littie Deeds, MD  isosorbide mononitrate (IMDUR) 30 MG 24 hr tablet Take 1 tablet (30 mg total) by mouth daily. Patient not taking: Reported on 09/30/2020 07/28/19   Gwenyth Bender, NP  meclizine (ANTIVERT) 25 MG tablet Take 1 tablet (25 mg total) by mouth 3 (three) times daily as needed for dizziness. 06/26/20   Alvira Monday, MD  metoprolol succinate (TOPROL-XL) 100 MG 24 hr tablet Take 100 mg by mouth daily. Take with or immediately following a meal.    [provider]  ondansetron (ZOFRAN ODT) 4 MG disintegrating tablet Take 1 tablet (4 mg total) by mouth every 8 (eight) hours as needed for nausea or vomiting. 06/26/20   Alvira Monday, MD  pantoprazole (PROTONIX) 40 MG tablet Take 1 tablet (40 mg total) by mouth 2 (two) times daily. 07/27/19   Joseph Art, DO  prazosin (MINIPRESS) 2 MG capsule Take 2 mg by mouth at bedtime. 10/28/18   [provider]  rosuvastatin (CRESTOR) 20 MG tablet Take 20 mg by mouth daily. 09/21/18   [provider]  sertraline (ZOLOFT) 50 MG tablet Take 150 mg by mouth daily.  10/28/18   [provider]  tamsulosin (FLOMAX) 0.4 MG CAPS capsule Take 1 capsule (0.4 mg total) by mouth daily after supper. 07/27/20   Gwyneth Sprout, MD  traZODone (DESYREL) 100 MG tablet Take 200 mg by mouth at bedtime.  12/19/18   [provider]  VICTOZA 18 MG/3ML SOPN Inject 1.8 mg into the skin daily. 10/02/20 11/01/20  Simmons-Robinson, Tawanna Cooler, MD    Allergies    Aspirin, Iodine-131, Iodinated diagnostic agents, Latex, Tramadol, Penicillins, Amoxicillin-pot clavulanate, Lidocaine, and Lisinopril  Review of Systems   Review of Systems  Constitutional: Negative for fever.  HENT: Negative for rhinorrhea and sore throat.   Eyes: Negative for redness.  Respiratory: Negative for cough and  shortness of breath.   Cardiovascular: Negative for chest pain.  Gastrointestinal: Negative for abdominal pain, diarrhea, nausea and vomiting.  Genitourinary: Negative for dysuria and hematuria.  Musculoskeletal: Positive for back pain, gait problem and neck pain. Negative for myalgias.  Skin: Negative for rash.  Neurological: Positive for weakness, numbness and headaches.    Physical Exam Updated Vital Signs BP 139/82   Pulse 61   Temp 98.6 F (37 C)   Resp 17   Ht 5' 5.5" (1.664 m)   Wt 123 kg   SpO2 96%   BMI 44.44 kg/m   Physical Exam Vitals and nursing note reviewed.  Constitutional:      Appearance: He is well-developed.  HENT:     Head: Normocephalic and atraumatic.  Right Ear: Tympanic membrane, ear canal and external ear normal. There is no impacted cerumen.     Left Ear: Tympanic membrane, ear canal and external ear normal. There is no impacted cerumen.     Nose: Nose normal.     Mouth/Throat:     Mouth: Mucous membranes are moist.     Pharynx: Uvula midline.  Eyes:     General: Lids are normal.     Conjunctiva/sclera: Conjunctivae normal.     Pupils: Pupils are equal, round, and reactive to light.  Cardiovascular:     Rate and Rhythm: Normal rate and regular rhythm.  Pulmonary:     Effort: Pulmonary effort is normal.     Breath sounds: Normal breath sounds.  Abdominal:     Palpations: Abdomen is soft.     Tenderness: There is no abdominal tenderness. There is no guarding or rebound.  Musculoskeletal:        General: Normal range of motion.     Cervical back: Normal range of motion and neck supple. No tenderness or bony tenderness.  Skin:    General: Skin is warm and dry.  Neurological:     Mental Status: He is alert and oriented to person, place, and time.     GCS: GCS eye subscore is 4. GCS verbal subscore is 5. GCS motor subscore is 6.     Cranial Nerves: No cranial nerve deficit, dysarthria or facial asymmetry.     Sensory: Sensory deficit  present.     Motor: Weakness and pronator drift present. No abnormal muscle tone.     Coordination: Coordination normal.     Deep Tendon Reflexes: Reflexes are normal and symmetric.     Comments: Pt can feel me touching right arm, foot distally, and right face but states sensation is 'stronger' on the left side.   Grip strength, elbow extension and flexion, shoulder abduction/adduction is weaker on right than left, but exam seems a bit inconsistent in regards to strength and effort.   + pronator drift of right arm   Deferred gait testing due to vertigo     ED Results / Procedures / Treatments   Labs (all labs ordered are listed, but only abnormal results are displayed) Labs Reviewed  BASIC METABOLIC PANEL - Abnormal; Notable for the following components:      Result Value   Glucose, Bld 207 (*)    Calcium 8.8 (*)    All other components within normal limits  URINALYSIS, ROUTINE W REFLEX MICROSCOPIC - Abnormal; Notable for the following components:   Ketones, ur 5 (*)    All other components within normal limits  VALPROIC ACID LEVEL - Abnormal; Notable for the following components:   Valproic Acid Lvl 32 (*)    All other components within normal limits  RAPID URINE DRUG SCREEN, HOSP PERFORMED - Abnormal; Notable for the following components:   Cocaine POSITIVE (*)    All other components within normal limits  CBG MONITORING, ED - Abnormal; Notable for the following components:   Glucose-Capillary 200 (*)    All other components within normal limits  RESP PANEL BY RT-PCR (FLU A&B, COVID) [id number]  CBC    EKG EKG Interpretation  Date/Time:  Wednesday October 09 2020 12:38:28 EDT Ventricular Rate:  60 PR Interval:  148 QRS Duration: 170 QT Interval:  486 QTC Calculation: 486 R Axis:   74 Text Interpretation: Sinus rhythm IVCD, consider atypical LBBB Paced rhythm, no change from previous Confirmed by Coralee Pesa [phone])  on 10/09/2020 1:01:46 PM   Radiology CT Head Wo  Contrast  Result Date: 10/09/2020 CLINICAL DATA:  Dizziness, right-sided weakness. EXAM: CT HEAD WITHOUT CONTRAST TECHNIQUE: Contiguous axial images were obtained from the base of the skull through the vertex without intravenous contrast. COMPARISON:  January 17, 2019. FINDINGS: Brain: No evidence of acute infarction, hemorrhage, hydrocephalus, extra-axial collection or mass lesion/mass effect. Vascular: No hyperdense vessel or unexpected calcification. Skull: Normal. Negative for fracture or focal lesion. Sinuses/Orbits: No acute finding. Other: None. IMPRESSION: Normal head CT. Electronically Signed   By: Lupita Raider M.D.   On: 10/09/2020 13:37    Procedures Procedures   Medications Ordered in ED Medications  meclizine (ANTIVERT) tablet 50 mg (50 mg Oral Given 10/09/20 1403)  metoCLOPramide (REGLAN) injection 10 mg (10 mg Intravenous Given 10/09/20 1552)  diphenhydrAMINE (BENADRYL) injection 25 mg (25 mg Intravenous Given 10/09/20 1552)    ED Course  I have reviewed the triage vital signs and the nursing notes.  Pertinent labs & imaging results that were available during my care of the patient were reviewed by me and considered in my medical decision making (see chart for details).  Patient seen and examined. Work-up initiated.  Meclizine ordered.   Vital signs reviewed and are as follows: BP 132/69   Pulse 60   Temp 98.6 F (37 C)   Resp 13   Ht 5' 5.5" (1.664 m)   Wt 123 kg   SpO2 94%   BMI 44.44 kg/m   Patient complaining of headache, Reglan and Benadryl ordered as well.  Patient discussed with Dr. Wilkie Aye.  I briefly discussed the patient case by phone with Dr. Amada Jupiter.  As patient is symptomatic and cannot readily get an MRI, feels admission would be appropriate for secondary risk factor evaluation.  Verified with MRI that patient would be able to get an MRI of the head given current pacemaker and leads.  He had an MRI done in 2020.  However this would have to be done at  [facility] and performed with specific staff available to monitor the device.  Will plan on admission to the hospital for stroke work-up.  Patient was just on the family practice service for chest pain.  I did speak with them about the possibility of having him readmitted to their service, however they will be unable to do so as he is at [employer].  I spoke with Dr. Ronaldo Miyamoto of [facility] who will see the patient.     MDM Rules/Calculators/A&P                          Patient with symptoms concerning for stroke in setting of multiple risk factors.  Patient to be admitted for further evaluation.  Difficulty obtaining timely advanced imaging due to pacemaker/defibrillator and IV contrast allergy.   Final Clinical Impression(s) / ED Diagnoses Final diagnoses:  Vertigo  Right sided weakness    Rx / DC Orders ED Discharge Orders    None       Renne Crigler, PA-C 10/09/20 1636    Horton, Clabe Seal, DO 10/11/20 2344

## 2020-10-09 NOTE — ED Notes (Signed)
[facility] here to transport pt to [place] 

## 2020-10-10 ENCOUNTER — Observation Stay (HOSPITAL_COMMUNITY): Payer: Medicare Other

## 2020-10-10 DIAGNOSIS — Z765 Malingerer [conscious simulation]: Secondary | ICD-10-CM

## 2020-10-10 DIAGNOSIS — G459 Transient cerebral ischemic attack, unspecified: Principal | ICD-10-CM

## 2020-10-10 DIAGNOSIS — F1410 Cocaine abuse, uncomplicated: Secondary | ICD-10-CM

## 2020-10-10 DIAGNOSIS — F17200 Nicotine dependence, unspecified, uncomplicated: Secondary | ICD-10-CM

## 2020-10-10 DIAGNOSIS — I5021 Acute systolic (congestive) heart failure: Secondary | ICD-10-CM

## 2020-10-10 DIAGNOSIS — R531 Weakness: Secondary | ICD-10-CM | POA: Diagnosis not present

## 2020-10-10 LAB — LIPID PANEL
Cholesterol: 96 mg/dL (ref 0–200)
HDL: 29 mg/dL — ABNORMAL LOW (ref >40)
LDL Cholesterol: 29 mg/dL (ref 0–99)
Total CHOL/HDL Ratio: 3.3 RATIO
Triglycerides: 190 mg/dL — ABNORMAL HIGH (ref <150)
VLDL: 38 mg/dL (ref 0–40)

## 2020-10-10 LAB — GLUCOSE, CAPILLARY
Glucose-Capillary: 202 mg/dL — ABNORMAL HIGH (ref 70–99)
Glucose-Capillary: 203 mg/dL — ABNORMAL HIGH (ref 70–99)
Glucose-Capillary: 184 mg/dL — ABNORMAL HIGH (ref 70–99)
Glucose-Capillary: 158 mg/dL — ABNORMAL HIGH (ref 70–99)

## 2020-10-10 MED ORDER — LORAZEPAM 1 MG PO TABS
1.0000 mg | ORAL_TABLET | Freq: Once | ORAL | Status: AC | PRN
  Administered 2020-10-10: 1 mg via ORAL
  Filled 2020-10-10: qty 1

## 2020-10-10 NOTE — Evaluation (Addendum)
Speech Language Pathology Evaluation Patient Details Name: Luis [name] MRN: [id number] DOB: 1970-08-24 Today's Date: 10/10/2020 Time: 5176-1607 SLP Time Calculation (min) (ACUTE ONLY): 29 min  Problem List:  Patient Active Problem List   Diagnosis Date Noted  . TIA (transient ischemic attack) 10/09/2020  . Leukocytosis 07/26/2019  . Nonischemic cardiomyopathy (HCC)   . Acute respiratory failure with hypoxia (HCC) 05/05/2019  . Rhinovirus infection 05/05/2019  . Morbid obesity (HCC) 05/05/2019  . Suspected COVID-19 virus infection 05/05/2019  . Acute intractable headache   . Cocaine abuse (HCC)   . Acute non intractable tension-type headache   . Blurred vision, bilateral   . Chronic systolic CHF (congestive heart failure) (HCC) 01/17/2019  . Pacemaker   . HLD (hyperlipidemia)   . Diabetes mellitus without complication (HCC)   . Hypertension   . GERD (gastroesophageal reflux disease)   . Nonspecific chest pain   . Tobacco abuse   . Alcohol abuse    Past Medical History:  Past Medical History:  Diagnosis Date  . Anxiety   . Biventricular ICD (implantable cardioverter-defibrillator) in place   . Chronic systolic CHF (congestive heart failure) (HCC)   . Cocaine use   . Diabetes mellitus without complication (HCC)   . GERD (gastroesophageal reflux disease)   . HLD (hyperlipidemia)   . Hypertension   . Morbid obesity (HCC)   . NICM (nonischemic cardiomyopathy) (HCC)   . OSA (obstructive sleep apnea)   . PTSD (post-traumatic stress disorder)    on Depakote  . Refusal of blood transfusions as patient is Jehovah's Witness   . Tobacco abuse    Past Surgical History:  Past Surgical History:  Procedure Laterality Date  . BIV ICD INSERTION CRT-D    . FOOT FRACTURE SURGERY    . ROTATOR CUFF REPAIR    . TONSILLECTOMY     HPI:  Luis [name] is a 50 y.o. male with PMH significant for Anxiety, Biventricular ICD in place, Chronic systolic CHF, Cocaine use, tobacco abuse, Diabetes  mellitus without complication, GERD, HLD, Hypertension, Morbid obesity, NICM, OSA, and PTSD who presented to ED with vertigo and developed weakness in his right arm and leg. He was discharged several days ago from the family medicine resident service after stay for chest pain secondary to cocaine abuse.  Cocaine (+) upon hospital admission. CT head was negative.   Assessment / Plan / Recommendation Clinical Impression  Luis [name] seen for speech language evaluation with Luis [name] expressing primary concern for change in memory from baseline. He is oriented to self, place and situation, but struggled to recall current date and was observed utlizing meal tray ticket to assist with identifying current year. No overt difficulties with expressive or receptive lanuage noted during informal conversation, confrontational naming (100%) or tasks involving 1-2 step commands. To assess current cognitive function, [employer] Mental Status (SLUMS) examination utilized, with Luis [name] achieving the following score: 15/30 with notable deficits in the areas of orientation, executive functions and delayed recall. ST services warranted to f/u for further treatment in these areas to maximize functional abilites.    SLP Assessment  SLP Recommendation/Assessment: Patient needs continued Speech Lanaguage Pathology Services SLP Visit Diagnosis: Cognitive communication deficit (R41.841)    Follow Up Recommendations   (To be determined)    Frequency and Duration min 2x/week  2 weeks      SLP Evaluation Cognition  Overall Cognitive Status: Impaired/Different from baseline Arousal/Alertness: Awake/alert Orientation Level: Oriented to person;Oriented to place;Oriented to situation;Disoriented to time Attention: Focused;Sustained;Selective Focused  Attention: Appears intact Sustained Attention: Appears intact Selective Attention: Impaired Selective Attention Impairment: Verbal basic Memory: Impaired Memory Impairment: Retrieval  deficit;Decreased recall of new information Immediate Memory Recall:  (5/5 (SLUMS)); Delayed recall: (3/5 (SLUMS)) Awareness: Appears intact Problem Solving: Impaired Problem Solving Impairment: Functional complex Executive Function: Self Correcting (0/3 mathematical calculations, 4/4 clock drawing (SLUMS)) Self Monitoring: Appears intact Self Correcting: Impaired Self Correcting Impairment: Functional basic Safety/Judgment: Appears intact       Comprehension  Auditory Comprehension Overall Auditory Comprehension: Appears within functional limits for tasks assessed Visual Recognition/Discrimination Discrimination: Not tested Reading Comprehension Reading Status: Not tested    Expression Expression Primary Mode of Expression: Verbal Verbal Expression Overall Verbal Expression: Appears within functional limits for tasks assessed Written Expression Dominant Hand: Right Written Expression: Not tested   Oral / Motor  Oral Motor/Sensory Function Overall Oral Motor/Sensory Function: Within functional limits Motor Speech Overall Motor Speech: Appears within functional limits for tasks assessed   GO                   Avie Echevaria, MA, CCC-SLP Acute Rehabilitation Services Office Number: [phone]  Paulette Blanch 10/10/2020, 12:57 PM

## 2020-10-10 NOTE — Progress Notes (Signed)
STROKE TEAM PROGRESS NOTE   SUBJECTIVE (INTERVAL HISTORY) No family is at the bedside.  Overall his condition is stable.  Patient is sitting at edge of bed, still complaining of dizziness and right-sided weakness. MRI reported possible left BG punctate infarct versus artifact.  On my review of the images, I do not appreciate any potential infarct but more favor artifact.  Patient UDS again showed cocaine positive, patient denies he was using cocaine but he admitted that he was cutting cocaine at home for sale.  I told him that was also illegal, he said "I have to do what I have to do".   OBJECTIVE Temp:  [97.5 F (36.4 C)-98.4 F (36.9 C)] 98.4 F (36.9 C) (04/14 1135) Pulse Rate:  [59-74] 74 (04/14 1135) Cardiac Rhythm: A-V Sequential paced;Bundle branch block (04/14 0830) Resp:  [12-22] 20 (04/14 1135) BP: (98-150)/(47-83) 118/67 (04/14 1135) SpO2:  [91 %-100 %] 97 % (04/14 1135)  Recent Labs  Lab 10/09/20 1251 10/09/20 2243 10/10/20 0633 10/10/20 1129  GLUCAP 200* 230* 202* 203*   Recent Labs  Lab 10/09/20 1223  NA 139  K 4.4  CL 105  CO2 26  GLUCOSE 207*  BUN 12  CREATININE 0.94  CALCIUM 8.8*   No results for input(s): AST, ALT, ALKPHOS, BILITOT, PROT, ALBUMIN in the last 168 hours. Recent Labs  Lab 10/09/20 1223  WBC 10.4  HGB 15.1  HCT 45.3  MCV 89.7  PLT 213   No results for input(s): CKTOTAL, CKMB, CKMBINDEX, TROPONINI in the last 168 hours. No results for input(s): LABPROT, INR in the last 72 hours. Recent Labs    10/09/20 1400  COLORURINE YELLOW  LABSPEC 1.016  PHURINE 5.0  GLUCOSEU NEGATIVE  HGBUR NEGATIVE  BILIRUBINUR NEGATIVE  KETONESUR 5*  PROTEINUR NEGATIVE  NITRITE NEGATIVE  LEUKOCYTESUR NEGATIVE       Component Value Date/Time   CHOL 96 10/10/2020 0238   TRIG 190 (H) 10/10/2020 0238   HDL 29 (L) 10/10/2020 0238   CHOLHDL 3.3 10/10/2020 0238   VLDL 38 10/10/2020 0238   LDLCALC 29 10/10/2020 0238   Lab Results  Component Value  Date   HGBA1C 10.2 (H) 09/30/2020      Component Value Date/Time   LABOPIA NONE DETECTED 10/09/2020 1400   COCAINSCRNUR POSITIVE (A) 10/09/2020 1400   LABBENZ NONE DETECTED 10/09/2020 1400   AMPHETMU NONE DETECTED 10/09/2020 1400   THCU NONE DETECTED 10/09/2020 1400   LABBARB NONE DETECTED 10/09/2020 1400    No results for input(s): ETH in the last 168 hours.  I have personally reviewed the radiological images below and agree with the radiology interpretations.  DG Lumbar Spine Complete  Result Date: 09/27/2020 CLINICAL DATA:  Low back pain EXAM: LUMBAR SPINE - COMPLETE 4+ VIEW COMPARISON:  None. FINDINGS: Five non rib-bearing lumbar type vertebra. Lumbar alignment within normal limits. Vertebral body heights are maintained. The disc spaces are grossly patent. There are minimal degenerative osteophytes. Minor facet degenerative changes of the lower lumbar spine IMPRESSION: Minimal degenerative changes. No acute osseous abnormality. Electronically Signed   By: Jasmine PangKim  Fujinaga M.D.   On: 09/27/2020 17:06   CT Head Wo Contrast  Result Date: 10/09/2020 CLINICAL DATA:  Dizziness, right-sided weakness. EXAM: CT HEAD WITHOUT CONTRAST TECHNIQUE: Contiguous axial images were obtained from the base of the skull through the vertex without intravenous contrast. COMPARISON:  January 17, 2019. FINDINGS: Brain: No evidence of acute infarction, hemorrhage, hydrocephalus, extra-axial collection or mass lesion/mass effect. Vascular: No hyperdense vessel  or unexpected calcification. Skull: Normal. Negative for fracture or focal lesion. Sinuses/Orbits: No acute finding. Other: None. IMPRESSION: Normal head CT. Electronically Signed   By: Lupita Raider M.D.   On: 10/09/2020 13:37   DG Chest Portable 1 View  Result Date: 09/30/2020 CLINICAL DATA:  Chest pain 04/05/2009 throbbing midsternal chest pain radiating to neck and down RIGHT arm EXAM: PORTABLE CHEST 1 VIEW COMPARISON:  December of 2021 FINDINGS: LEFT-sided 3  lead pacer defibrillator in place as before. Trachea midline. Cardiac enlargement as before. Lungs are clear. Limited skeletal assessment is unremarkable. IMPRESSION: No active disease. Electronically Signed   By: Donzetta Kohut M.D.   On: 09/30/2020 12:42   CT ANGIO CHEST AORTA W/CM & OR WO/CM  Result Date: 09/30/2020 CLINICAL DATA:  50 year old male with chest pain. EXAM: CT ANGIOGRAPHY CHEST WITH CONTRAST TECHNIQUE: Multidetector CT imaging of the chest was performed using the standard protocol during bolus administration of intravenous contrast. Multiplanar CT image reconstructions and MIPs were obtained to evaluate the vascular anatomy. CONTRAST:  OMNIPAQUE IOHEXOL 350 MG/ML SOLN COMPARISON:  Chest radiograph dated 09/30/2020. FINDINGS: Cardiovascular: There is mild cardiomegaly. No pericardial effusion. Left pectoral AICD device. The thoracic aorta is unremarkable. The origins of the great vessels of the aortic arch appear patent as visualized. Evaluation of the pulmonary arteries is limited due to respiratory motion artifact and suboptimal opacification and timing of the contrast. No large or central pulmonary artery embolus identified. Mediastinum/Nodes: There is no hilar or mediastinal adenopathy. The esophagus and the thyroid gland are grossly unremarkable. No mediastinal fluid collection. Lungs/Pleura: The lungs are clear. There is no pleural effusion pneumothorax. The central airways are patent. Upper Abdomen: No acute abnormality. Musculoskeletal: Degenerative changes of the spine. No acute osseous pathology. Review of the MIP images confirms the above findings. IMPRESSION: 1. No acute intrathoracic pathology. No CT evidence of central pulmonary artery embolus. 2. Mild cardiomegaly. Electronically Signed   By: Elgie Collard M.D.   On: 09/30/2020 22:40   ECHOCARDIOGRAM COMPLETE  Result Date: 10/01/2020    ECHOCARDIOGRAM REPORT   Patient Name:   Luis [name] Date of Exam: 10/01/2020  Medical Rec #:  [id number]             Height:       65.5 in Accession #:    [id number]            Weight:       270.9 lb Date of Birth:  Jul 19, 1970              BSA:          2.263 m Patient Age:    50 years              BP:           126/62 mmHg Patient Gender: M                     HR:           69 bpm. Exam Location:  Inpatient Procedure: 2D Echo, Cardiac Doppler and Color Doppler Indications:    Chest pain; nonischemic cardiomyopathy  History:        Patient has prior history of Echocardiogram examinations, most                 recent 07/26/2019. CHF; Pacemaker.  Sonographer:    Neomia Dear RDCS Referring Phys: [id number] MARGARET E PRAY IMPRESSIONS  1. Left ventricular ejection fraction, by  estimation, is 30 to 35%. The left ventricle has moderately decreased function. The left ventricle demonstrates global hypokinesis. There is moderate eccentric left ventricular hypertrophy. Left ventricular diastolic parameters are consistent with Grade II diastolic dysfunction (pseudonormalization). Elevated left atrial pressure.  2. Right ventricular systolic function is normal. The right ventricular size is normal. There is normal pulmonary artery systolic pressure. The estimated right ventricular systolic pressure is 27.8 mmHg.  3. Left atrial size was mildly dilated.  4. The mitral valve is normal in structure. No evidence of mitral valve regurgitation.  5. The aortic valve is tricuspid. Aortic valve regurgitation is not visualized.  6. The inferior vena cava is normal in size with greater than 50% respiratory variability, suggesting right atrial pressure of 3 mmHg. Comparison(s): Prior images reviewed side by side. The left ventricular function is worsened. FINDINGS  Left Ventricle: Left ventricular ejection fraction, by estimation, is 30 to 35%. The left ventricle has moderately decreased function. The left ventricle demonstrates global hypokinesis. The left ventricular internal cavity size was normal in size. There is  moderate eccentric left ventricular hypertrophy. Abnormal (paradoxical) septal motion, consistent with RV pacemaker. Left ventricular diastolic parameters are consistent with Grade II diastolic dysfunction (pseudonormalization). Elevated left atrial pressure. Right Ventricle: The right ventricular size is normal. No increase in right ventricular wall thickness. Right ventricular systolic function is normal. There is normal pulmonary artery systolic pressure. The tricuspid regurgitant velocity is 2.49 m/s, and  with an assumed right atrial pressure of 3 mmHg, the estimated right ventricular systolic pressure is 27.8 mmHg. Left Atrium: Left atrial size was mildly dilated. Right Atrium: Right atrial size was normal in size. Pericardium: There is no evidence of pericardial effusion. Mitral Valve: The mitral valve is normal in structure. No evidence of mitral valve regurgitation. MV peak gradient, 5.0 mmHg. The mean mitral valve gradient is 2.0 mmHg. Tricuspid Valve: The tricuspid valve is normal in structure. Tricuspid valve regurgitation is trivial. Aortic Valve: The aortic valve is tricuspid. Aortic valve regurgitation is not visualized. Aortic valve mean gradient measures 8.0 mmHg. Aortic valve peak gradient measures 14.1 mmHg. Aortic valve area, by VTI measures 1.29 cm. Pulmonic Valve: The pulmonic valve was grossly normal. Pulmonic valve regurgitation is not visualized. Aorta: The aortic root and ascending aorta are structurally normal, with no evidence of dilitation. Venous: The inferior vena cava is normal in size with greater than 50% respiratory variability, suggesting right atrial pressure of 3 mmHg. IAS/Shunts: No atrial level shunt detected by color flow Doppler. Additional Comments: A device lead is visualized.  LEFT VENTRICLE PLAX 2D LVIDd:         5.30 cm      Diastology LVIDs:         4.80 cm      LV e' medial:    7.07 cm/s LV PW:         1.60 cm      LV E/e' medial:  13.8 LV IVS:        1.40 cm      LV  e' lateral:   5.98 cm/s LVOT diam:     1.80 cm      LV E/e' lateral: 16.3 LV SV:         45 LV SV Index:   20 LVOT Area:     2.54 cm  LV Volumes (MOD) LV vol d, MOD A2C: 134.0 ml LV vol d, MOD A4C: 129.0 ml LV vol s, MOD A2C: 91.3 ml LV vol s, MOD  A4C: 105.0 ml LV SV MOD A2C:     42.7 ml LV SV MOD A4C:     129.0 ml LV SV MOD BP:      28.5 ml RIGHT VENTRICLE RV S prime:     20.10 cm/s  PULMONARY VEINS TAPSE (M-mode): 2.3 cm      A Reversal Duration: 120.00 msec                             A Reversal Velocity: 20.60 cm/s                             Diastolic Velocity:  24.70 cm/s                             S/D Velocity:        1.70                             Systolic Velocity:   41.10 cm/s LEFT ATRIUM             Index       RIGHT ATRIUM           Index LA diam:        3.70 cm 1.63 cm/m  RA Area:     17.20 cm LA Vol (A2C):   73.5 ml 32.47 ml/m RA Volume:   43.80 ml  19.35 ml/m LA Vol (A4C):   49.1 ml 21.69 ml/m LA Biplane Vol: 60.6 ml 26.77 ml/m  AORTIC VALVE                    PULMONIC VALVE AV Area (Vmax):    1.20 cm     PV Vmax:       1.36 m/s AV Area (Vmean):   1.26 cm     PV Vmean:      86.800 cm/s AV Area (VTI):     1.29 cm     PV VTI:        0.232 m AV Vmax:           188.00 cm/s  PV Peak grad:  7.3 mmHg AV Vmean:          133.000 cm/s PV Mean grad:  3.0 mmHg AV VTI:            0.348 m AV Peak Grad:      14.1 mmHg AV Mean Grad:      8.0 mmHg LVOT Vmax:         88.90 cm/s LVOT Vmean:        65.700 cm/s LVOT VTI:          0.176 m LVOT/AV VTI ratio: 0.51  AORTA Ao Root diam: 3.10 cm Ao Asc diam:  2.70 cm MITRAL VALVE               TRICUSPID VALVE MV Area (PHT): 3.48 cm    TR Peak grad:   24.8 mmHg MV Area VTI:   1.26 cm    TR Vmax:        249.00 cm/s MV Peak grad:  5.0 mmHg MV Mean grad:  2.0 mmHg    SHUNTS MV Vmax:       1.12 m/s    Systemic VTI:  0.18 m MV Vmean:  71.9 cm/s   Systemic Diam: 1.80 cm MV Decel Time: 218 msec MR Peak grad: 72.9 mmHg MR Mean grad: 46.0 mmHg MR Vmax:      427.00 cm/s  MR Vmean:     322.0 cm/s MV E velocity: 97.50 cm/s MV A velocity: 86.70 cm/s MV E/A ratio:  1.12 Mihai Croitoru MD Electronically signed by Thurmon Fair MD Signature Date/Time: 10/01/2020/6:14:18 PM    Final     PHYSICAL EXAM  Temp:  [97.5 F (36.4 C)-98.4 F (36.9 C)] 98.4 F (36.9 C) (04/14 1135) Pulse Rate:  [59-74] 74 (04/14 1135) Resp:  [12-22] 20 (04/14 1135) BP: (98-150)/(47-83) 118/67 (04/14 1135) SpO2:  [91 %-100 %] 97 % (04/14 1135)  General - morbid obesity, well developed, in no apparent distress.  Ophthalmologic - fundi not visualized due to noncooperation.  Cardiovascular - Regular rhythm and rate.  Neuro - pt sitting at the edge of bed, awake alert and orientated x 3. No aphasia, fluent language, following all simple commands. Able to name and repeat. No gaze palsy, tracking bilaterally, visual field full, PERRL, no nystagmus or eye disconjugation. No facial droop. Tongue midline. LUE and LLE 5/5, RUE and RLE exam showed giveaway weakness and lack of effort. Sensation decreased light touch on the right subjectively, b/l FTN intact, gait not tested.     ASSESSMENT/PLAN [name] is a 50 y.o. male with history of morbid obesity, ICD placement, CHF, cocaine abuse, diabetes, hyperlipidemia, hypertension, OSA, smoker, PTSD admitted for vertigo and right-sided weakness. No tPA given due to outside window.    Likely conversion disorder vs. Malingering  MRI reported possible left BG punctate infarct versus artifact.  On my review of the images, I do not appreciate any potential infarct but more likely artifact.   MRA head and neck - unremarkable for vessel stenosis  2D Echo EF 30 to 35% on 10/01/2020  LDL 29  HgbA1c 10.2  SCDs for VTE prophylaxis  clopidogrel 75 mg daily prior to admission, now on clopidogrel 75 mg daily.  Continue on discharge. Patient has aspirin allergy.  Patient counseled to be compliant with his antithrombotic  medications  Ongoing aggressive stroke risk factor management  Therapy recommendations:  CIR  Disposition: Pending  Diabetes  HgbA1c 10.2, goal < 7.0  Uncontrolled with hyperglycemia  CBG monitoring  SSI  DM education and close PCP follow up for better DM control  Hypertension . Stable  Long term BP goal normotensive  Hyperlipidemia  Home meds: Crestor 20  LDL 29, goal < 70  Now on Crestor 20  Continue statin at discharge  Cocaine abuse  UDS for cocaine positive multiple occasions  Cessation education provided  Informed patient selling cocaine also illegal  Patient willing to quit  Tobacco abuse  Current smoker  Smoking cessation counseling provided  Pt is willing to quit  Other Stroke Risk Factors  ETOH use, educated on limit alcohol use provided  Obesity, Body mass index is 44.44 kg/m.   CHF  Obstructive sleep apnea   Other Active Problems  Status post ICD  PTSD on Depakote - continue Depakote  Hospital day # 0  Neurology will sign off. Please call with questions. Thanks for the consult.   Marvel Plan, MD PhD Stroke Neurology 10/10/2020 4:00 PM    To contact Stroke Continuity provider, please refer to [email or web address]. After hours, contact General Neurology

## 2020-10-10 NOTE — Progress Notes (Signed)
RT note. Asked pt. If he would like to wear CPAP machine tonight, pt. Stated he was in prayer. And mentioned he did not like the mask and isn't sure if he would like to wear CPAP machine again. I told pt. To let his RN know if he decides to change his mind and try wearing CPAP again tonight. CPAP is in room and all set up for pt. To use. RT will continue to monitor

## 2020-10-10 NOTE — Consult Note (Signed)
NEUROLOGY CONSULTATION NOTE   Date of service: October 10, 2020 Patient Name: Luis Luis MRN:  [id number] DOB:  1971-02-09 Reason for consult: transfer from [facility] for stroke w/u _ _ _   _ __   _ __ _ _  __ __   _ __   __ _  History of Present Illness   Luis Luis is a 50 y.o. male with PMH significant for  has a past medical history of Anxiety, Biventricular ICD (implantable cardioverter-defibrillator) in place, Chronic systolic CHF (congestive heart failure) (HCC), Cocaine use, Diabetes mellitus without complication (HCC), GERD (gastroesophageal reflux disease), HLD (hyperlipidemia), Hypertension, Morbid obesity (HCC), NICM (nonischemic cardiomyopathy) (HCC), OSA (obstructive sleep apnea), PTSD (post-traumatic stress disorder), Refusal of blood transfusions as patient is Jehovah's Witness, and Tobacco abuse.   Patient was transferred to [facility] from [facility] after presenting earlier today from PCP's office with 2 days of R sided weakness and vertigo.  He was discharged several days ago from the family medicine resident service after stay for chest pain secondary to cocaine abuse.  He states that he was fine for several days after discharge however 2 days ago he noticed that he was having difficulty with coordination in his right hand.  He developed weakness in his right arm which progressed to include his right leg the following day.  He is also having vertigo since the onset of his symptoms 2 days ago.  He spoke with his PCP about his symptoms who advised him to come to the ED.  CT head in the ED was negative.  Cocaine (+) on admission.  ROS   10 point review of systems was performed and was negative except as described in HPI.  Past History   Past Medical History:  Diagnosis Date  . Anxiety   . Biventricular ICD (implantable cardioverter-defibrillator) in place   . Chronic systolic CHF (congestive heart failure) (HCC)   . Cocaine use   . Diabetes mellitus without complication (HCC)    . GERD (gastroesophageal reflux disease)   . HLD (hyperlipidemia)   . Hypertension   . Morbid obesity (HCC)   . NICM (nonischemic cardiomyopathy) (HCC)   . OSA (obstructive sleep apnea)   . PTSD (post-traumatic stress disorder)    on Depakote  . Refusal of blood transfusions as patient is Jehovah's Witness   . Tobacco abuse    Past Surgical History:  Procedure Laterality Date  . BIV ICD INSERTION CRT-D    . FOOT FRACTURE SURGERY    . ROTATOR CUFF REPAIR    . TONSILLECTOMY     Family History  Problem Relation Age of Onset  . Hypertension Mother   . Bone cancer Father   . Lung cancer Father    Social History   Socioeconomic History  . Marital status: Legally Separated    Spouse name: Not on file  . Number of children: Not on file  . Years of education: Not on file  . Highest education level: Not on file  Occupational History  . Not on file  Tobacco Use  . Smoking status: Current Every Day Smoker    Types: Cigarettes  . Smokeless tobacco: Never Used  . Tobacco comment: occasionally  Vaping Use  . Vaping Use: Never used  Substance and Sexual Activity  . Alcohol use: Not Currently  . Drug use: Yes    Types: Cocaine  . Sexual activity: Not on file  Other Topics Concern  . Not on file  Social History Narrative  .  Not on file   Social Determinants of Health   Financial Resource Strain: Not on file  Food Insecurity: Not on file  Transportation Needs: Not on file  Physical Activity: Not on file  Stress: Not on file  Social Connections: Not on file   Allergies  Allergen Reactions  . Aspirin Anaphylaxis    Throat closing   . Iodine-131 Hives    Hives  . Iodinated Diagnostic Agents Hives  . Latex Rash and Hives  . Tramadol Hives and Nausea Only  . Penicillins Nausea And Vomiting  . Amoxicillin-Pot Clavulanate Diarrhea  . Lidocaine Rash  . Lisinopril Cough    Medications   Medications Prior to Admission  Medication Sig Dispense Refill Last Dose  .  carvedilol (COREG) 6.25 MG tablet Take 6.25 mg by mouth 2 (two) times daily with a meal.     . clopidogrel (PLAVIX) 75 MG tablet Take 75 mg by mouth daily.   10/08/2020 at 7 am  . clotrimazole-betamethasone (LOTRISONE) cream Apply 1 application topically daily.   10/09/2020 at Unknown time  . divalproex (DEPAKOTE) 500 MG DR tablet Take 500 mg by mouth 2 (two) times a day.   10/09/2020 at Unknown time  . furosemide (LASIX) 80 MG tablet Take 80 mg by mouth daily.   10/09/2020 at Unknown time  . HUMALOG MIX 75/25 KWIKPEN (75-25) 100 UNIT/ML Kwikpen Inject 20 Units into the skin 2 (two) times daily before a meal. (Patient taking differently: Inject 60 Units into the skin 3 (three) times daily.) 15 mL 11 10/09/2020 at Unknown time  . metoprolol succinate (TOPROL-XL) 100 MG 24 hr tablet Take 100 mg by mouth daily. Take with or immediately following a meal.   10/09/2020 at 8 am  . Multiple Vitamin (MULTIVITAMIN WITH MINERALS) TABS tablet Take 1 tablet by mouth daily.   10/09/2020 at Unknown time  . pantoprazole (PROTONIX) 40 MG tablet Take 1 tablet (40 mg total) by mouth 2 (two) times daily. (Patient taking differently: Take 40 mg by mouth daily.) 60 tablet 0 10/09/2020 at Unknown time  . prazosin (MINIPRESS) 2 MG capsule Take 2 mg by mouth at bedtime.   10/08/2020 at Unknown time  . rosuvastatin (CRESTOR) 20 MG tablet Take 20 mg by mouth daily.   10/08/2020 at Unknown time  . sertraline (ZOLOFT) 50 MG tablet Take 100 mg by mouth daily.   10/09/2020 at Unknown time  . traZODone (DESYREL) 100 MG tablet Take 200 mg by mouth at bedtime.    10/08/2020 at Unknown time  . VICTOZA 18 MG/3ML SOPN Inject 1.8 mg into the skin daily. 9 mL 0 10/09/2020 at Unknown time  . isosorbide mononitrate (IMDUR) 30 MG 24 hr tablet Take 1 tablet (30 mg total) by mouth daily. (Patient not taking: No sig reported) 30 tablet 0 Not Taking at Unknown time  . meclizine (ANTIVERT) 25 MG tablet Take 1 tablet (25 mg total) by mouth 3 (three) times daily  as needed for dizziness. (Patient not taking: Reported on 10/09/2020) 30 tablet 0 Not Taking at Unknown time  . ondansetron (ZOFRAN ODT) 4 MG disintegrating tablet Take 1 tablet (4 mg total) by mouth every 8 (eight) hours as needed for nausea or vomiting. (Patient not taking: Reported on 10/09/2020) 20 tablet 0 Not Taking at Unknown time  . tamsulosin (FLOMAX) 0.4 MG CAPS capsule Take 1 capsule (0.4 mg total) by mouth daily after supper. (Patient not taking: Reported on 10/09/2020) 14 capsule 0 Not Taking at Unknown time  Vitals   Vitals:   10/09/20 2000 10/09/20 2030 10/09/20 2100 10/09/20 2246  BP: 139/80 139/74 129/66 (!) 111/47  Pulse: 62 64 60 60  Resp: 16 (!) 22 (!) 21 20  Temp:    98 F (36.7 C)  TempSrc:    Oral  SpO2: 95% 96% 97% 94%  Weight:      Height:         Body mass index is 44.44 kg/m.  Physical Exam   Physical Exam Gen: A&O x4, NAD HEENT: Atraumatic, normocephalic;mucous membranes moist; oropharynx clear, tongue without atrophy or fasciculations. Neck: Supple, trachea midline. Resp: CTAB, no w/r/r CV: RRR, no m/g/r; nml S1 and S2. 2+ symmetric peripheral pulses. Abd: soft/NT/ND; nabs x 4 quad Extrem: Nml bulk; no cyanosis, clubbing, or edema.  Neuro: *MS: A&O x4. Follows multi-step commands.  *Speech: fluid, nondysarthric. Naming and repetition intact *CN:    I: Deferred   II,III: PERRLA, VFF by confrontation, optic discs sharp   III,IV,VI: EOMI w/o nystagmus, no ptosis   V: Sensation intact from V1 to V3 to LT   VII: Eyelid closure was full.  R UMN facial droop.   VIII: Hearing intact to voice   IX,X: Voice normal, palate elevates symmetrically    XI: SCM/trap 5/5 bilat   XII: Tongue protrudes midline, no atrophy or fasciculations   *Motor:   Normal bulk.  No tremor, rigidity or bradykinesia. Drift RUE and RLE    Strength: Dlt Bic Tri WrE WrF FgS Gr HF KnF KnE PlF DoF    Left 5 5 5 5 5 5 5 5 5 5 5 5     Right 5 4+ 4 4+` 4+ 4+ 4 4- 4- 4 4+ 4+     *Sensory: Impaired to LT RUE and RLE. No extinction to DSS. *Coordination:  FNF with dysmetria on L, normal on R. *Reflexes:  2+ and symmetric throughout without clonus; toes down-going bilat *Gait: deferred  1a Level of Conscious.: 0 1b LOC Questions: 0 1c LOC Commands: 0 2 Best Gaze: 0 3 Visual: 0 4 Facial Palsy: 1 5a Motor Arm - left: 0 5b Motor Arm - Right: 1 6a Motor Leg - Left: 0 6b Motor Leg - Right: 2 7 Limb Ataxia: 1 8 Sensory: 1 9 Best Language: 0 10 Dysarthria: 1 11 Extinct. and Inatten.: 0 TOTAL: 7    Labs   CBC:  Recent Labs  Lab 10/09/20 1223  WBC 10.4  HGB 15.1  HCT 45.3  MCV 89.7  PLT 213    Basic Metabolic Panel:  Lab Results  Component Value Date   NA 139 10/09/2020   K 4.4 10/09/2020   CO2 26 10/09/2020   GLUCOSE 207 (H) 10/09/2020   BUN 12 10/09/2020   CREATININE 0.94 10/09/2020   CALCIUM 8.8 (L) 10/09/2020   GFRNONAA >60 10/09/2020   GFRAA >60 09/05/2019   Lipid Panel:  Lab Results  Component Value Date   LDLCALC 31 07/26/2019   HgbA1c:  Lab Results  Component Value Date   HGBA1C 10.2 (H) 09/30/2020   Urine Drug Screen:     Component Value Date/Time   LABOPIA NONE DETECTED 10/09/2020 1400   COCAINSCRNUR POSITIVE (A) 10/09/2020 1400   LABBENZ NONE DETECTED 10/09/2020 1400   AMPHETMU NONE DETECTED 10/09/2020 1400   THCU NONE DETECTED 10/09/2020 1400   LABBARB NONE DETECTED 10/09/2020 1400    Alcohol Level     Component Value Date/Time   ETH <10 09/30/2020 1744   1. Left  ventricular ejection fraction, by estimation, is 30 to 35%. The  left ventricle has moderately decreased function. The left ventricle  demonstrates global hypokinesis. There is moderate eccentric left  ventricular hypertrophy. Left ventricular  diastolic parameters are consistent with Grade II diastolic dysfunction  (pseudonormalization). Elevated left atrial pressure.  2. Right ventricular systolic function is normal. The right ventricular   size is normal. There is normal pulmonary artery systolic pressure. The  estimated right ventricular systolic pressure is 27.8 mmHg.  3. Left atrial size was mildly dilated.  4. The mitral valve is normal in structure. No evidence of mitral valve  regurgitation.  5. The aortic valve is tricuspid. Aortic valve regurgitation is not  visualized.  6. The inferior vena cava is normal in size with greater than 50%  respiratory variability, suggesting right atrial pressure of 3 mmHg.   No intracardiac clot  LDL 29   Impression   50 yo man with hx multiple cerebrovascular risk factors incl DM, HTN, HL as well as OSA, chronic systolic CHF with biventricular ICD, refusal of blood products 2/2 being Jehovah's witness, chronic cocaine abuse admitted with 2 days of vertigo with R-sided weakness and R dysametria c/f ischemic stroke.  Recommendations   - MRI brain wo contrast - MRA H&N - Check A1c - Continue rosuvastatin 20mg  daily (LDL 29) - Continue plavix 75mg  daily - Permissive HTN x48 hrs from sx onset or until stroke ruled out by MRI goal BP <220/110. PRN labetalol or hydralazine if BP above these parameters. Avoid oral antihypertensives. - q4 hr neuro checks - STAT head CT for any change in neuro exam - Tele - PT/OT/SLP - Stroke education - Amb referral to neurology upon discharge w/ amb cardiac monitoring. - Substance abuse counseling.  Stroke team will continue to follow.   ______________________________________________________________________   Thank you for the opportunity to take part in the care of this patient. If you have any further questions, please contact the neurology consultation attending.  Signed,  Bing Neighbors, MD [facility] [phone]  If 7pm- 7am, please page neurology on call as listed in AMION.

## 2020-10-10 NOTE — Evaluation (Signed)
Physical Therapy Evaluation Patient Details Name: Luis [name] MRN: [id number] DOB: [birth year] Today's Date: 10/10/2020   History of Present Illness  Pt isa 50 y/o male transferred from [facility] to [facility] with R sided weakness and vertigo. CT negative. MRI pending. PMH of anxiety, biventricular ICD, chronic systolic CHF, cocaine use, DM, HTN, morbid obesity , OSA, PTSD, Refusal of blood transfusions as patient is Jehovah's Witness, and Tobacco abuse.  Clinical Impression  PTA, patient lives alone and reports independence with mobility using SPC as needed, not driving. Patient presents with generalized weakness, impaired balance, decreased activity tolerance, and impaired sensation/coordination, dizziness, and impaired cognition. Patient follows simple commands for MMT in bed, however refuses OOB mobility. Education limited due to lack of concern from patient. Patient will benefit from skilled PT services during acute stay to address listed deficits.     Follow Up Recommendations Other (comment) (TBD on agreement to OOB)    Equipment Recommendations  Rolling Floria Brandau with 5" wheels    Recommendations for Other Services       Precautions / Restrictions Precautions Precautions: Fall Restrictions Weight Bearing Restrictions: No      Mobility  Bed Mobility               General bed mobility comments: pt declined    Transfers                 General transfer comment: pt declined  Ambulation/Gait                Stairs            Wheelchair Mobility    Modified Rankin (Stroke Patients Only)       Balance                                             Pertinent Vitals/Pain Pain Assessment: 0-10 Pain Score: 8  Pain Location: neck, R arm and leg Pain Descriptors / Indicators: Sharp;Pins and needles Pain Intervention(s): Monitored during session    Home Living Family/patient expects to be discharged to:: Private residence Living  Arrangements: Alone Available Help at Discharge: Friend(s);Available PRN/intermittently Type of Home: Apartment Home Access: Level entry     Home Layout: One level Home Equipment: Cane - single point;Grab bars - toilet;Grab bars - tub/shower Additional Comments: life line button, reports left RW at hospital last admission    Prior Function Level of Independence: Independent with assistive device(s)         Comments: does not drive, used cane as needed, limited IADLs, reports 2 falls in the last 6 months     Hand Dominance   Dominant Hand: Right    Extremity/Trunk Assessment   Upper Extremity Assessment Upper Extremity Assessment: Defer to OT evaluation    Lower Extremity Assessment Lower Extremity Assessment: RLE deficits/detail RLE Deficits / Details: grossly 3/5 RLE Sensation: decreased light touch RLE Coordination: decreased gross motor;decreased fine motor LLE Deficits / Details: grossly 4/5, impaired sensation in foot, chronic LLE Sensation: decreased light touch    Cervical / Trunk Assessment Cervical / Trunk Assessment: Normal  Communication   Communication: No difficulties  Cognition Arousal/Alertness: Awake/alert Behavior During Therapy: Flat affect Overall Cognitive Status: Impaired/Different from baseline Area of Impairment: Awareness;Problem solving  Awareness: Emergent Problem Solving: Requires verbal cues;Slow processing General Comments: patient reports he is able to "walk out of here" but declines to get up due to dizziness and R sided weakness therefore question awareness and understanding of assist level      General Comments General comments (skin integrity, edema, etc.): limited session due to pt declining OOB    Exercises     Assessment/Plan    PT Assessment Patient needs continued PT services  PT Problem List Decreased strength;Decreased range of motion;Decreased activity tolerance;Decreased  balance;Decreased coordination;Decreased mobility;Decreased safety awareness;Impaired sensation       PT Treatment Interventions DME instruction;Gait training;Functional mobility training;Therapeutic activities;Therapeutic exercise;Balance training;Patient/family education    PT Goals (Current goals can be found in the Care Plan section)  Acute Rehab PT Goals Patient Stated Goal: less dizziness PT Goal Formulation: With patient Time For Goal Achievement: 10/24/20 Potential to Achieve Goals: Good    Frequency Min 3X/week   Barriers to discharge Decreased caregiver support      Co-evaluation PT/OT/SLP Co-Evaluation/Treatment: Yes Reason for Co-Treatment: For patient/therapist safety;To address functional/ADL transfers PT goals addressed during session: Mobility/safety with mobility         AM-PAC PT "6 Clicks" Mobility  Outcome Measure Help needed turning from your back to your side while in a flat bed without using bedrails?: A Little Help needed moving from lying on your back to sitting on the side of a flat bed without using bedrails?: A Little Help needed moving to and from a bed to a chair (including a wheelchair)?: A Little Help needed standing up from a chair using your arms (e.g., wheelchair or bedside chair)?: A Little Help needed to walk in hospital room?: A Little Help needed climbing 3-5 steps with a railing? : A Lot 6 Click Score: 17    End of Session   Activity Tolerance: Other (comment) (self limiting) Patient left: in bed;with call bell/phone within reach;with bed alarm set Nurse Communication: Mobility status PT Visit Diagnosis: Muscle weakness (generalized) (M62.81);Unsteadiness on feet (R26.81);Difficulty in walking, not elsewhere classified (R26.2)    Time: 1103-1594 PT Time Calculation (min) (ACUTE ONLY): 19 min   Charges:   PT Evaluation $PT Eval Moderate Complexity: 1 Mod          Luis [name] PT, DPT Acute Rehabilitation  Services Pager [phone] Office [phone]   Luis [name] 10/10/2020, 2:58 PM

## 2020-10-10 NOTE — Progress Notes (Signed)
Physical Therapy Treatment Patient Details Name: Luis [name] MRN: [id number] DOB: 04/04/1971 Today's Date: 10/10/2020    History of Present Illness Pt isa 50 y/o male transferred from [facility] to [facility] with R sided weakness and vertigo. CT negative. MRI pending. PMH of anxiety, biventricular ICD, chronic systolic CHF, cocaine use, DM, HTN, morbid obesity , OSA, PTSD, Refusal of blood transfusions as patient is Jehovah's Witness, and Tobacco abuse.    PT Comments    Returned to room with NT due to patient willing to get OOB. Patient reports dizziness throughout session with no nystagmus noted with transitions. Patient ambulated 2 x 10' with minA and RW, patient with reports of feeling like he is walking on 1 foot due to impaired sensation. Educated patient on use of visual input for compensatory strategy due to impaired sensation. Recommend CIR following discharge to maximize functional independence and assist with return to PLOF following functional decline.     Follow Up Recommendations  CIR     Equipment Recommendations  Rolling Clell Trahan with 5" wheels    Recommendations for Other Services Rehab consult     Precautions / Restrictions Precautions Precautions: Fall Restrictions Weight Bearing Restrictions: No    Mobility  Bed Mobility Overal bed mobility: Needs Assistance Bed Mobility: Supine to Sit;Sit to Supine     Supine to sit: Min guard Sit to supine: Min assist   General bed mobility comments: min guard for safety for OOB. Required minA for bringing R LE onto bed    Transfers Overall transfer level: Needs assistance Equipment used: Rolling Tyrees Chopin (2 wheeled) Transfers: Sit to/from Stand Sit to Stand: Min assist         General transfer comment: minA for boost up into standing. Cues for hand placement and use of R LE as patient tends to stick R LE out as if to treat as NWB. Encouraged WBing through R LE for feedback  Ambulation/Gait Ambulation/Gait assistance:  Min assist Gait Distance (Feet): 10 Feet (x 2) Assistive device: Rolling Romona Murdy (2 wheeled) Gait Pattern/deviations: Step-to pattern;Step-through pattern;Decreased stride length;Decreased weight shift to right;Decreased stance time - right;Wide base of support (hop to) Gait velocity: decreased   General Gait Details: varying step through, step to, and hop to due to impaired sensation and patient stating "it feels like I'm walking on 1 leg." Encouraged WBing through R LE for feedback with intermittent follow through. Assist required for balance and RW management. Would benefit from chair follow   Stairs             Wheelchair Mobility    Modified Rankin (Stroke Patients Only)       Balance Overall balance assessment: Needs assistance Sitting-balance support: No upper extremity supported;Feet supported Sitting balance-Leahy Scale: Good     Standing balance support: Bilateral upper extremity supported;During functional activity Standing balance-Leahy Scale: Poor Standing balance comment: reliant on UE support and external assist                            Cognition Arousal/Alertness: Awake/alert Behavior During Therapy: Flat affect Overall Cognitive Status: Impaired/Different from baseline Area of Impairment: Awareness;Problem solving                           Awareness: Emergent Problem Solving: Requires verbal cues;Slow processing General Comments: poor follow through with commands as patient is adamant to perform his way. Upon standing, patient more aware of deficits but  continues to lack safety awareness.      Exercises      General Comments General comments (skin integrity, edema, etc.):      Pertinent Vitals/Pain Pain Assessment: 0-10 Pain Score: 8  Pain Location: neck, R arm and leg Pain Descriptors / Indicators: Sharp;Pins and needles Pain Intervention(s): Monitored during session;Repositioned    Home Living Family/patient  expects to be discharged to:: Private residence Living Arrangements: Alone Available Help at Discharge: Friend(s);Available PRN/intermittently Type of Home: Apartment Home Access: Level entry   Home Layout: One level Home Equipment: Cane - single point;Grab bars - toilet;Grab bars - tub/shower Additional Comments: life line button, reports left RW at hospital last admission    Prior Function Level of Independence: Independent with assistive device(s)      Comments: does not drive, used cane as needed, limited IADLs, reports 2 falls in the last 6 months   PT Goals (current goals can now be found in the care plan section) Acute Rehab PT Goals Patient Stated Goal: less dizziness PT Goal Formulation: With patient Time For Goal Achievement: 10/24/20 Potential to Achieve Goals: Good Progress towards PT goals: Progressing toward goals    Frequency    Min 3X/week      PT Plan Current plan remains appropriate    Co-evaluation PT/OT/SLP Co-Evaluation/Treatment: Yes Reason for Co-Treatment: For patient/therapist safety;To address functional/ADL transfers PT goals addressed during session: Mobility/safety with mobility        AM-PAC PT "6 Clicks" Mobility   Outcome Measure  Help needed turning from your back to your side while in a flat bed without using bedrails?: A Little Help needed moving from lying on your back to sitting on the side of a flat bed without using bedrails?: A Little Help needed moving to and from a bed to a chair (including a wheelchair)?: A Little Help needed standing up from a chair using your arms (e.g., wheelchair or bedside chair)?: A Little Help needed to walk in hospital room?: A Little Help needed climbing 3-5 steps with a railing? : A Lot 6 Click Score: 17    End of Session Equipment Utilized During Treatment: Gait belt Activity Tolerance: Patient tolerated treatment well Patient left: in bed;with call bell/phone within reach;with bed alarm  set Nurse Communication: Mobility status PT Visit Diagnosis: Muscle weakness (generalized) (M62.81);Unsteadiness on feet (R26.81);Difficulty in walking, not elsewhere classified (R26.2)     Time: 1829-9371 PT Time Calculation (min) (ACUTE ONLY): 23 min  Charges:  $Gait Training: 8-22 mins $Therapeutic Activity: 8-22 mins                     Shea Kapur A. Dan Humphreys PT, DPT Acute Rehabilitation Services Pager [phone] Office [phone]    Viviann Spare 10/10/2020, 4:05 PM

## 2020-10-10 NOTE — Progress Notes (Signed)
Per order, Changed device settings for MRI to  DOO at 95 bpm  Tachy-therapies to off  Will program device back to pre-MRI settings after completion of exam, and send transmission.

## 2020-10-10 NOTE — Progress Notes (Signed)
Inpatient Diabetes Program Recommendations  AACE/ADA: New Consensus Statement on Inpatient Glycemic Control (2015)  Target Ranges:  Prepandial:   less than 140 mg/dL      Peak postprandial:   less than 180 mg/dL (1-2 hours)      Critically ill patients:  140 - 180 mg/dL   Lab Results  Component Value Date   GLUCAP 203 (H) 10/10/2020   HGBA1C 10.2 (H) 09/30/2020   Results for REINHOLD, RICKEY (MRN [id number]) as of 10/10/2020 13:57  Ref. Range 10/09/2020 12:51 10/09/2020 22:43 10/10/2020 06:33 10/10/2020 11:29  Glucose-Capillary Latest Ref Range: 70 - 99 mg/dL 474 (H) 259 (H) 563 (H) 203 (H)   Review of Glycemic Control  Diabetes history: type 2 Outpatient Diabetes medications: 75/25 insulin 60 units TID, Victoza 1.8 mg daily Current orders for Inpatient glycemic control: Novolog RESISTANT correction scale TID & HS scale  Inpatient Diabetes Program Recommendations:   Noted that patient's blood sugars have been greater than 180 mg/dl. Patient eating 100% of meals. Recommend adding Novolog 3 units TID with meals if eating at least 50% of meal and CBGs remain elevated.   Smith Mince RN BSN CDE Diabetes Coordinator Pager: [phone]  8am-5pm

## 2020-10-10 NOTE — Progress Notes (Signed)
[facility] PROGRESS NOTE    AFSHIN CHRYSTAL  JOA:[id number] DOB: Apr 11, 1971 DOA: 10/09/2020 PCP: Margot Ables, MD      Brief Narrative:  Mr. Bibbee is a 50 y.o. M with HTN, sCHF EF now recovered, hx ICD, depression/PTSD, cocaine abuse, DM, and coronary disease who presented with right sided weakness.  Patient recently admitted with chest pain after cocaine use.  CTA negative, no further ischemic work up per Cardiology.  After returning home, the patient developed right sided weakness and so went to his PCP's office and was sent to the ER.  In the ER, CT head unremarkable.  Admitted for MRI and CVA work up.       Assessment & Plan:  Acute stroke like symptoms MRI pending given ICD.  -PT eval -Recent echo without cardiogenic source of embolism -Obtain carotid imaging with MRA  -Lipids ordered: continue high intensity statin Crestor -Neuro recommend continue Plavix over aspirin -Atrial fibrillation: not present on tele -tPA not given because outside window -Dysphagia screen ordered in ER -PT eval ordered: recommended CIR     Coronary disease, secondary prevention Hypertension Ischemic cardiomyopathy with chronic systolic CHF (prior EF 25-30%, now normalized) -Continue Plavix, Crestor -Allow permissive HTN for now -Hold Coreg, metop, Lasix  Cocaine use -Cessation recommended  Diabetes, type 2, poorly ocntrolled Glucose high normal, recent A1c >10%. -Continue SS corrections -Hold Victoza  Obesity BMI 44  Depression/PTSD -Continue Depakote -Hold prazosin for permissive HTN    OSA -Continue CPAP at night   Disposition: Status is: Observation  The patient will require ongoing in hospital care because: Unsafe d/c plan and ongoing work up for stroke with MRI brain delayed due to ICD   Dispo: The patient is from: Home              Anticipated d/c is to: CIR              Patient currently is not medically stable to  d/c. has ongoing stroke work up   Difficult to place patient No       Level of care: Telemetry Medical       MDM: The below labs and imaging reports were reviewed and summarized above.  Medication management as above.    DVT prophylaxis: SCD's Start: 10/09/20 2205  Code Status: FULL Family Communication: None present            Subjective: Patient has back and neck pain which is chronic.  He has some continued weakness on the right side.  He has no fever, loss of consciousness, seizure, respiratory symptoms, chest pain.  Objective: Vitals:   10/10/20 1135 10/10/20 1500 10/10/20 1602 10/10/20 1935  BP: 118/67  131/75 (!) 156/81  Pulse: 74  75 92  Resp: 20  20 18   Temp: 98.4 F (36.9 C)  98 F (36.7 C) 98.5 F (36.9 C)  TempSrc: Oral  Oral Oral  SpO2: 97% 95% 97% 99%  Weight:      Height:        Intake/Output Summary (Last 24 hours) at 10/10/2020 2001 Last data filed at 10/10/2020 1817 Gross per 24 hour  Intake 840 ml  Output 1800 ml  Net -960 ml   Filed Weights   10/09/20 1219  Weight: 123 kg    Examination: General appearance: Obese adult male, alert and in moderate distress due to discomfort and weakness and anxiety.   HEENT: Anicteric, conjunctiva pink, lids and lashes normal. No nasal deformity, discharge, epistaxis.  Lips moist.   Skin: Warm and dry.  No jaundice.  No suspicious rashes or lesions. Cardiac: RRR, nl S1-S2, no murmurs appreciated.  Capillary refill is brisk.  JVP not visible.  No LE edema.  Radial pulses 2+ and symmetric. Respiratory: Normal respiratory rate and rhythm.  CTAB without rales or wheezes. Abdomen: Abdomen soft.  No TTP or guarding. No ascites, distension, hepatosplenomegaly.   MSK: No deformities or effusions. Neuro: Awake and alert.  EOMI, moves all extremities with weakness on the right. Speech fluent.    Psych: Sensorium intact and responding to questions, attention normal. Affect anxious.  Judgment and insight  appear normal.    Data Reviewed: I have personally reviewed following labs and imaging studies:  CBC: Recent Labs  Lab 10/09/20 1223  WBC 10.4  HGB 15.1  HCT 45.3  MCV 89.7  PLT 213   Basic Metabolic Panel: Recent Labs  Lab 10/09/20 1223  NA 139  K 4.4  CL 105  CO2 26  GLUCOSE 207*  BUN 12  CREATININE 0.94  CALCIUM 8.8*   GFR: Estimated Creatinine Clearance: 114.1 mL/min (by C-G formula based on SCr of 0.94 mg/dL). Liver Function Tests: No results for input(s): AST, ALT, ALKPHOS, BILITOT, PROT, ALBUMIN in the last 168 hours. No results for input(s): LIPASE, AMYLASE in the last 168 hours. No results for input(s): AMMONIA in the last 168 hours. Coagulation Profile: No results for input(s): INR, PROTIME in the last 168 hours. Cardiac Enzymes: No results for input(s): CKTOTAL, CKMB, CKMBINDEX, TROPONINI in the last 168 hours. BNP (last 3 results) No results for input(s): PROBNP in the last 8760 hours. HbA1C: No results for input(s): HGBA1C in the last 72 hours. CBG: Recent Labs  Lab 10/09/20 1251 10/09/20 2243 10/10/20 0633 10/10/20 1129 10/10/20 1630  GLUCAP 200* 230* 202* 203* 184*   Lipid Profile: Recent Labs    10/10/20 0238  CHOL 96  HDL 29*  LDLCALC 29  TRIG 098*  CHOLHDL 3.3   Thyroid Function Tests: No results for input(s): TSH, T4TOTAL, FREET4, T3FREE, THYROIDAB in the last 72 hours. Anemia Panel: No results for input(s): VITAMINB12, FOLATE, FERRITIN, TIBC, IRON, RETICCTPCT in the last 72 hours. Urine analysis:    Component Value Date/Time   COLORURINE YELLOW 10/09/2020 1400   APPEARANCEUR CLEAR 10/09/2020 1400   LABSPEC 1.016 10/09/2020 1400   PHURINE 5.0 10/09/2020 1400   GLUCOSEU NEGATIVE 10/09/2020 1400   HGBUR NEGATIVE 10/09/2020 1400   BILIRUBINUR NEGATIVE 10/09/2020 1400   KETONESUR 5 (A) 10/09/2020 1400   PROTEINUR NEGATIVE 10/09/2020 1400   NITRITE NEGATIVE 10/09/2020 1400   LEUKOCYTESUR NEGATIVE 10/09/2020 1400   Sepsis  Labs: @LABRCNTIP (procalcitonin:4,lacticacidven:4)  ) Recent Results (from the past 240 hour(s))  Resp Panel by RT-PCR (Flu A&B, Covid) Nasopharyngeal Swab     Status: None   Collection Time: 10/09/20  3:55 PM   Specimen: Nasopharyngeal Swab; Nasopharyngeal(NP) swabs in vial transport medium  Result Value Ref Range Status   SARS Coronavirus 2 by RT PCR NEGATIVE NEGATIVE Final    Comment: (NOTE) SARS-CoV-2 target nucleic acids are NOT DETECTED.  The SARS-CoV-2 RNA is generally detectable in upper respiratory specimens during the acute phase of infection. The lowest concentration of SARS-CoV-2 viral copies this assay can detect is 138 copies/mL. A negative result does not preclude SARS-Cov-2 infection and should not be used as the sole basis for treatment or other patient management decisions. A negative result may occur with  improper specimen collection/handling, submission of specimen other than  nasopharyngeal swab, presence of viral mutation(s) within the areas targeted by this assay, and inadequate number of viral copies(<138 copies/mL). A negative result must be combined with clinical observations, patient history, and epidemiological information. The expected result is Negative.  Fact Sheet for Patients:  BloggerCourse.com  Fact Sheet for Healthcare Providers:  SeriousBroker.it  This test is no t yet approved or cleared by the [place] FDA and  has been authorized for detection and/or diagnosis of SARS-CoV-2 by FDA under an Emergency Use Authorization (EUA). This EUA will remain  in effect (meaning this test can be used) for the duration of the COVID-19 declaration under Section 564(b)(1) of the Act, 21 U.S.C.section 360bbb-3(b)(1), unless the authorization is terminated  or revoked sooner.       Influenza A by PCR NEGATIVE NEGATIVE Final   Influenza B by PCR NEGATIVE NEGATIVE Final    Comment: (NOTE) The Xpert  Xpress SARS-CoV-2/FLU/RSV plus assay is intended as an aid in the diagnosis of influenza from Nasopharyngeal swab specimens and should not be used as a sole basis for treatment. Nasal washings and aspirates are unacceptable for Xpert Xpress SARS-CoV-2/FLU/RSV testing.  Fact Sheet for Patients: BloggerCourse.com  Fact Sheet for Healthcare Providers: SeriousBroker.it  This test is not yet approved or cleared by the [place] FDA and has been authorized for detection and/or diagnosis of SARS-CoV-2 by FDA under an Emergency Use Authorization (EUA). This EUA will remain in effect (meaning this test can be used) for the duration of the COVID-19 declaration under Section 564(b)(1) of the Act, 21 U.S.C. section 360bbb-3(b)(1), unless the authorization is terminated or revoked.  Performed at [facility], [address], [place], [place] [address]          Radiology Studies: CT Head Wo Contrast  Result Date: 10/09/2020 CLINICAL DATA:  Dizziness, right-sided weakness. EXAM: CT HEAD WITHOUT CONTRAST TECHNIQUE: Contiguous axial images were obtained from the base of the skull through the vertex without intravenous contrast. COMPARISON:  January 17, 2019. FINDINGS: Brain: No evidence of acute infarction, hemorrhage, hydrocephalus, extra-axial collection or mass lesion/mass effect. Vascular: No hyperdense vessel or unexpected calcification. Skull: Normal. Negative for fracture or focal lesion. Sinuses/Orbits: No acute finding. Other: None. IMPRESSION: Normal head CT. Electronically Signed   By: Lupita Raider M.D.   On: 10/09/2020 13:37   MR ANGIO HEAD WO CONTRAST  Result Date: 10/10/2020 CLINICAL DATA:  Neuro deficit, acute, stroke suspected; right-sided paresthesia, weakness, vertigo. EXAM: MRI HEAD WITHOUT CONTRAST MRA HEAD WITHOUT CONTRAST MRA NECK WITHOUT CONTRAST TECHNIQUE: Multiplanar, multi-echo pulse sequences of the  brain and surrounding structures were acquired without intravenous contrast. Angiographic images of the Circle of Willis were acquired using MRA technique without intravenous contrast. Angiographic images of the neck were acquired using MRA technique without intravenous contrast. Carotid stenosis measurements (when applicable) are obtained utilizing NASCET criteria, using the distal internal carotid diameter as the denominator. COMPARISON:  Head CT 10/09/2020. Brain MRI 01/19/2019. CT angiography head/neck 01/19/2019. FINDINGS: MRI HEAD FINDINGS Brain: Mild intermittent motion degradation. Mild generalized parenchymal atrophy. There is an apparent punctate focus of restricted diffusion within the left lentiform nucleus, appreciated on the axial diffusion-weighted sequence only (series 5, image 74) (not appreciated on the coronal diffusion-weighted sequence). This may reflect a punctate acute/early subacute infarct or image noise artifact. No cortical encephalomalacia is identified. No significant white matter disease. No evidence of intracranial mass. No chronic intracranial blood products. No extra-axial fluid collection. No midline shift. Vascular: Expected proximal arterial flow  voids. Skull and upper cervical spine: No focal marrow lesion. Sinuses/Orbits: Visualized orbits show no acute finding. No significant paranasal sinus disease. MRA HEAD FINDINGS The examination is mildly motion degraded. Anterior circulation: The intracranial internal carotid arteries are patent. The M1 middle cerebral arteries are patent. No M2 proximal branch occlusion or high-grade proximal stenosis is identified. The anterior cerebral arteries are patent. 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 In retrospect, this was present on the prior CTA of 01/19/2019 (but is seen to better advantage on today's exam) and unchanged in size since that time. 1-2 mm inferiorly projecting vascular  protrusion arising from the paraclinoid left ICA which may reflect an additional aneurysm or an infundibulum (series 9, image 74). 2-3 mm infundibulum at the origin of the right posterior communicating artery. Posterior circulation: The intracranial vertebral arteries are patent. Possible basilar artery fenestration (versus artifact from motion at this level) the posterior cerebral arteries are patent. A right posterior communicating artery is present. The left posterior communicating artery is hypoplastic or absent. Anatomic variants: As described MRA NECK FINDINGS The examination is significantly motion degraded and this precludes adequate evaluation for stenoses within the common carotid, internal carotid and vertebral arteries within the neck. Aortic arch: Excluded from the field of view. Right carotid system: Patent with antegrade flow. Left carotid system: Patent with antegrade flow. Vertebral arteries: Patent with antegrade flow. IMPRESSION: MRI brain: 1. Mildly motion degraded examination. 2. Punctate acute/early subacute infarct within the left basal ganglia versus image noise artifact (see discussion above). 3. Mild generalized parenchymal atrophy. MRA head: 1. Mildly motion degraded examination, somewhat limiting evaluation. 2. No evidence of intracranial large vessel occlusion or proximal high-grade arterial stenosis. 3. 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 4. 1-2 mm vascular protrusion arising from the paraclinoid left internal carotid artery, which may reflect an additional aneurysm or an infundibulum. 5. Possible basilar artery fenestration (versus artifact from motion at this level). MRA neck: The common carotid, internal carotid and vertebral arteries are patent within the neck with antegrade flow. However, significant motion degradation precludes adequate evaluation for stenoses within these vessels. Electronically Signed   By: Jackey LogeKyle  Golden DO   On: 10/10/2020  16:17   MR ANGIO NECK WO CONTRAST  Result Date: 10/10/2020 CLINICAL DATA:  Neuro deficit, acute, stroke suspected; right-sided paresthesia, weakness, vertigo. EXAM: MRI HEAD WITHOUT CONTRAST MRA HEAD WITHOUT CONTRAST MRA NECK WITHOUT CONTRAST TECHNIQUE: Multiplanar, multi-echo pulse sequences of the brain and surrounding structures were acquired without intravenous contrast. Angiographic images of the Circle of Willis were acquired using MRA technique without intravenous contrast. Angiographic images of the neck were acquired using MRA technique without intravenous contrast. Carotid stenosis measurements (when applicable) are obtained utilizing NASCET criteria, using the distal internal carotid diameter as the denominator. COMPARISON:  Head CT 10/09/2020. Brain MRI 01/19/2019. CT angiography head/neck 01/19/2019. FINDINGS: MRI HEAD FINDINGS Brain: Mild intermittent motion degradation. Mild generalized parenchymal atrophy. There is an apparent punctate focus of restricted diffusion within the left lentiform nucleus, appreciated on the axial diffusion-weighted sequence only (series 5, image 74) (not appreciated on the coronal diffusion-weighted sequence). This may reflect a punctate acute/early subacute infarct or image noise artifact. No cortical encephalomalacia is identified. No significant white matter disease. No evidence of intracranial mass. No chronic intracranial blood products. No extra-axial fluid collection. No midline shift. Vascular: Expected proximal arterial flow voids. Skull and upper cervical spine: No focal marrow lesion. Sinuses/Orbits:  Visualized orbits show no acute finding. No significant paranasal sinus disease. MRA HEAD FINDINGS The examination is mildly motion degraded. Anterior circulation: The intracranial internal carotid arteries are patent. The M1 middle cerebral arteries are patent. No M2 proximal branch occlusion or high-grade proximal stenosis is identified. The anterior cerebral  arteries are patent. 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 In retrospect, this was present on the prior CTA of 01/19/2019 (but is seen to better advantage on today's exam) and unchanged in size since that time. 1-2 mm inferiorly projecting vascular protrusion arising from the paraclinoid left ICA which may reflect an additional aneurysm or an infundibulum (series 9, image 74). 2-3 mm infundibulum at the origin of the right posterior communicating artery. Posterior circulation: The intracranial vertebral arteries are patent. Possible basilar artery fenestration (versus artifact from motion at this level) the posterior cerebral arteries are patent. A right posterior communicating artery is present. The left posterior communicating artery is hypoplastic or absent. Anatomic variants: As described MRA NECK FINDINGS The examination is significantly motion degraded and this precludes adequate evaluation for stenoses within the common carotid, internal carotid and vertebral arteries within the neck. Aortic arch: Excluded from the field of view. Right carotid system: Patent with antegrade flow. Left carotid system: Patent with antegrade flow. Vertebral arteries: Patent with antegrade flow. IMPRESSION: MRI brain: 1. Mildly motion degraded examination. 2. Punctate acute/early subacute infarct within the left basal ganglia versus image noise artifact (see discussion above). 3. Mild generalized parenchymal atrophy. MRA head: 1. Mildly motion degraded examination, somewhat limiting evaluation. 2. No evidence of intracranial large vessel occlusion or proximal high-grade arterial stenosis. 3. 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 4. 1-2 mm vascular protrusion arising from the paraclinoid left internal carotid artery, which may reflect an additional aneurysm or an infundibulum. 5. Possible basilar artery fenestration (versus artifact from  motion at this level). MRA neck: The common carotid, internal carotid and vertebral arteries are patent within the neck with antegrade flow. However, significant motion degradation precludes adequate evaluation for stenoses within these vessels. Electronically Signed   By: Jackey Loge DO   On: 10/10/2020 16:17   MR BRAIN WO CONTRAST  Result Date: 10/10/2020 CLINICAL DATA:  Neuro deficit, acute, stroke suspected; right-sided paresthesia, weakness, vertigo. EXAM: MRI HEAD WITHOUT CONTRAST MRA HEAD WITHOUT CONTRAST MRA NECK WITHOUT CONTRAST TECHNIQUE: Multiplanar, multi-echo pulse sequences of the brain and surrounding structures were acquired without intravenous contrast. Angiographic images of the Circle of Willis were acquired using MRA technique without intravenous contrast. Angiographic images of the neck were acquired using MRA technique without intravenous contrast. Carotid stenosis measurements (when applicable) are obtained utilizing NASCET criteria, using the distal internal carotid diameter as the denominator. COMPARISON:  Head CT 10/09/2020. Brain MRI 01/19/2019. CT angiography head/neck 01/19/2019. FINDINGS: MRI HEAD FINDINGS Brain: Mild intermittent motion degradation. Mild generalized parenchymal atrophy. There is an apparent punctate focus of restricted diffusion within the left lentiform nucleus, appreciated on the axial diffusion-weighted sequence only (series 5, image 74) (not appreciated on the coronal diffusion-weighted sequence). This may reflect a punctate acute/early subacute infarct or image noise artifact. No cortical encephalomalacia is identified. No significant white matter disease. No evidence of intracranial mass. No chronic intracranial blood products. No extra-axial fluid collection. No midline shift. Vascular: Expected proximal arterial flow voids. Skull and upper cervical spine: No focal marrow lesion. Sinuses/Orbits: Visualized orbits show no acute finding. No significant  paranasal sinus disease.  MRA HEAD FINDINGS The examination is mildly motion degraded. Anterior circulation: The intracranial internal carotid arteries are patent. The M1 middle cerebral arteries are patent. No M2 proximal branch occlusion or high-grade proximal stenosis is identified. The anterior cerebral arteries are patent. 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 In retrospect, this was present on the prior CTA of 01/19/2019 (but is seen to better advantage on today's exam) and unchanged in size since that time. 1-2 mm inferiorly projecting vascular protrusion arising from the paraclinoid left ICA which may reflect an additional aneurysm or an infundibulum (series 9, image 74). 2-3 mm infundibulum at the origin of the right posterior communicating artery. Posterior circulation: The intracranial vertebral arteries are patent. Possible basilar artery fenestration (versus artifact from motion at this level) the posterior cerebral arteries are patent. A right posterior communicating artery is present. The left posterior communicating artery is hypoplastic or absent. Anatomic variants: As described MRA NECK FINDINGS The examination is significantly motion degraded and this precludes adequate evaluation for stenoses within the common carotid, internal carotid and vertebral arteries within the neck. Aortic arch: Excluded from the field of view. Right carotid system: Patent with antegrade flow. Left carotid system: Patent with antegrade flow. Vertebral arteries: Patent with antegrade flow. IMPRESSION: MRI brain: 1. Mildly motion degraded examination. 2. Punctate acute/early subacute infarct within the left basal ganglia versus image noise artifact (see discussion above). 3. Mild generalized parenchymal atrophy. MRA head: 1. Mildly motion degraded examination, somewhat limiting evaluation. 2. No evidence of intracranial large vessel occlusion or proximal high-grade  arterial stenosis. 3. 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 4. 1-2 mm vascular protrusion arising from the paraclinoid left internal carotid artery, which may reflect an additional aneurysm or an infundibulum. 5. Possible basilar artery fenestration (versus artifact from motion at this level). MRA neck: The common carotid, internal carotid and vertebral arteries are patent within the neck with antegrade flow. However, significant motion degradation precludes adequate evaluation for stenoses within these vessels. Electronically Signed   By: Jackey Loge DO   On: 10/10/2020 16:17        Scheduled Meds: . clopidogrel  75 mg Oral Daily  . divalproex  500 mg Oral BID  . insulin aspart  0-20 Units Subcutaneous TID WC  . insulin aspart  0-5 Units Subcutaneous QHS  . pantoprazole  40 mg Oral Daily  . rosuvastatin  20 mg Oral Daily   Continuous Infusions:   LOS: 0 days    Time spent: 25 minutes    Alberteen Sam, MD [facility] 10/10/2020, 8:01 PM     Please page though AMION or Epic secure chat:  For [employer], Higher education careers adviser

## 2020-10-10 NOTE — Evaluation (Signed)
Occupational Therapy Evaluation Patient Details Name: Luis [name] MRN: [id number] DOB: May 02, 1971 Today's Date: 10/10/2020    History of Present Illness Pt isa 50 y/o male with PMH of anxiety, biventricular ICD, chronic systolic CHF, cocaine use, DM, HTN, morbid obesity , OSA, PTSD, Refusal of blood transfusions as patient is Jehovah's Witness, and Tobacco abuse. Transferred from [facility] to [facility] with R sided weakness and vertigo. CT negative. MRI pending.   Clinical Impression   PTA patient reports independent with ADLs, mobility using cane as needed, limited IADLs and not driving. Admitted for above and limited by problem list below, including impaired vision (reports blurry), R sided weakness and impaired sensation/coordination, dizziness, and cognition.  Patient oriented and follows simple commands for strength/sensation testing, but question awareness and problem solving as reports not needing assist and will just leave, although still having R sided weakness and dizziness therefore declining to get OOB.  Patient session limited to bed level, completing simulated grooming tasks with setup assist, anticipate min assist for UB ADLs and up to max assist for LB ADLs. Initiated education on focused gaze due to dizziness, but pt with poor attempt to try to improve dizziness. OT will follow acutely to address listed deficits as above for optimize safety and independence with ADLs, mobility.  Will update dc recommendations once patient agreeable to OOB activities.     Follow Up Recommendations  Other (comment) (TBD when pt agrees to get OOB)    Equipment Recommendations  Other (comment) (TBD)    Recommendations for Other Services       Precautions / Restrictions Precautions Precautions: Fall Restrictions Weight Bearing Restrictions: No      Mobility Bed Mobility               General bed mobility comments: pt declined    Transfers                 General transfer  comment: pt declined    Balance                                           ADL either performed or assessed with clinical judgement   ADL Overall ADL's : Needs assistance/impaired     Grooming: Wash/dry hands;Set up;Bed level                                 General ADL Comments: pt declined further ADLs and OOB due to dizziness     Vision Baseline Vision/History: Wears glasses Wears Glasses:  (unclear, reports bifocals) Patient Visual Report: Blurring of vision Additional Comments: further assessment required, pt declined due to dizziness and attempted focused gaze education but pt with no effort to complete     Perception     Praxis      Pertinent Vitals/Pain Pain Assessment: 0-10 Pain Score: 8  Pain Location: neck, right arm Pain Descriptors / Indicators: Sharp Pain Intervention(s): Other (comment) (notified RN)     Hand Dominance Right   Extremity/Trunk Assessment Upper Extremity Assessment Upper Extremity Assessment: RUE deficits/detail RUE Deficits / Details: grossly 3-/5 MMT, decreased coordination and impaired sensation to UE (but chart reveals chronic sensation deficts) RUE Sensation: decreased light touch RUE Coordination: decreased fine motor   Lower Extremity Assessment Lower Extremity Assessment: Defer to PT evaluation  Communication Communication Communication: No difficulties   Cognition Arousal/Alertness: Awake/alert Behavior During Therapy: Flat affect Overall Cognitive Status: Impaired/Different from baseline Area of Impairment: Awareness;Problem solving                           Awareness: Emergent Problem Solving: Requires verbal cues;Slow processing General Comments: patient reports he is able to "walk out of here" but declines to get up due to dizziness and R sided weakness therefore question awareness and understanding of assist level   General Comments  limited session due to pt  declining OOB    Exercises     Shoulder Instructions      Home Living Family/patient expects to be discharged to:: Private residence Living Arrangements: Alone Available Help at Discharge: Friend(s);Available PRN/intermittently Type of Home: Apartment Home Access: Level entry     Home Layout: One level     Bathroom Shower/Tub: Producer, television/film/video: Handicapped height     Home Equipment: Cane - single point;Grab bars - toilet;Grab bars - tub/shower   Additional Comments: life line button, reports left RW at hospital last admission      Prior Functioning/Environment Level of Independence: Independent with assistive device(s)        Comments: does not drive, used cane as needed, limited IADLs, reports 2 falls in the last 6 months        OT Problem List: Pain;Decreased activity tolerance;Decreased knowledge of use of DME or AE;Impaired balance (sitting and/or standing);Decreased strength;Impaired vision/perception;Decreased coordination;Decreased safety awareness;Decreased knowledge of precautions;Impaired sensation;Obesity      OT Treatment/Interventions: Self-care/ADL training;DME and/or AE instruction;Therapeutic activities;Balance training;Patient/family education;Cognitive remediation/compensation;Visual/perceptual remediation/compensation;Neuromuscular education    OT Goals(Current goals can be found in the care plan section) Acute Rehab OT Goals Patient Stated Goal: less dizziness OT Goal Formulation: With patient Time For Goal Achievement: 10/24/20 Potential to Achieve Goals: Good  OT Frequency: Min 2X/week   Barriers to D/C:            Co-evaluation PT/OT/SLP Co-Evaluation/Treatment: Yes Reason for Co-Treatment: For patient/therapist safety;To address functional/ADL transfers   OT goals addressed during session: ADL's and self-care      AM-PAC OT "6 Clicks" Daily Activity     Outcome Measure Help from another person eating meals?: A  Little Help from another person taking care of personal grooming?: A Little Help from another person toileting, which includes using toliet, bedpan, or urinal?: A Lot Help from another person bathing (including washing, rinsing, drying)?: A Lot Help from another person to put on and taking off regular upper body clothing?: A Little Help from another person to put on and taking off regular lower body clothing?: A Lot 6 Click Score: 15   End of Session Nurse Communication: Mobility status;Other (comment) (dizziness, participation)  Activity Tolerance: Treatment limited secondary to agitation Patient left: in bed;with call bell/phone within reach;with bed alarm set  OT Visit Diagnosis: Other abnormalities of gait and mobility (R26.89);Pain;Muscle weakness (generalized) (M62.81);Dizziness and giddiness (R42);Other symptoms and signs involving the nervous system (R29.898) Pain - Right/Left: Right Pain - part of body: Arm;Leg                Time: 1610-9604 OT Time Calculation (min): 19 min Charges:  OT General Charges $OT Visit: 1 Visit OT Evaluation $OT Eval Moderate Complexity: 1 Mod  Barry Brunner, OT Acute Rehabilitation Services Pager [phone] Office [phone]   Chancy Milroy 10/10/2020, 10:58 AM

## 2020-10-10 NOTE — Plan of Care (Signed)
  Problem: Education: Goal: Knowledge of General Education information will improve Description Including pain rating scale, medication(s)/side effects and non-pharmacologic comfort measures Outcome: Progressing   Problem: Clinical Measurements: Goal: Respiratory complications will improve Outcome: Progressing   Problem: Activity: Goal: Risk for activity intolerance will decrease Outcome: Progressing   Problem: Coping: Goal: Level of anxiety will decrease Outcome: Progressing   Problem: Pain Managment: Goal: General experience of comfort will improve Outcome: Progressing   

## 2020-10-10 NOTE — Progress Notes (Signed)
Pt from 3W36 transferred to and from MRI. VS stable, Device was programmed to [place], R, Charity fundraiser (see her note). Anxious and agitated. Procedure explained, emotional support was given. While laying flat, pt said "I cannot lay flat, I am drowning". The choice was given to place O2 [place] or refuse the procedure. Pt stated, "I will try". Was told at any moment, he can push the call bell (bulb) and it will be stopped immediately.  2L was applied. Upon placing equipment on pts head, patient star screaming "No, I SAID NO, I cannot do it". Pt's  aggression was reduced by calming techniques, active listening and explaining what choices are at this point. Closing eyes were suggested and have an image in mind. Pt asked to continue, allowed equipment placed on. MRI went smoothly after that.  Returned to 2H57,. Alarms are on, call bell, urinal, phone  in reach, reported to RN.

## 2020-10-11 DIAGNOSIS — G459 Transient cerebral ischemic attack, unspecified: Secondary | ICD-10-CM | POA: Diagnosis not present

## 2020-10-11 DIAGNOSIS — R531 Weakness: Secondary | ICD-10-CM | POA: Diagnosis not present

## 2020-10-11 LAB — GLUCOSE, CAPILLARY
Glucose-Capillary: 169 mg/dL — ABNORMAL HIGH (ref 70–99)
Glucose-Capillary: 196 mg/dL — ABNORMAL HIGH (ref 70–99)
Glucose-Capillary: 158 mg/dL — ABNORMAL HIGH (ref 70–99)

## 2020-10-11 MED ORDER — ACETAMINOPHEN 500 MG PO TABS
1000.0000 mg | ORAL_TABLET | Freq: Three times a day (TID) | ORAL | Status: DC | PRN
  Administered 2020-10-11: 1000 mg via ORAL
  Filled 2020-10-11: qty 2

## 2020-10-11 MED ORDER — MELATONIN 5 MG PO TABS
5.0000 mg | ORAL_TABLET | Freq: Every evening | ORAL | Status: DC | PRN
  Administered 2020-10-11: 5 mg via ORAL
  Filled 2020-10-11: qty 1

## 2020-10-11 NOTE — Plan of Care (Signed)
  Problem: Education: Goal: Knowledge of General Education information will improve Description: Including pain rating scale, medication(s)/side effects and non-pharmacologic comfort measures Outcome: Adequate for Discharge   Problem: Health Behavior/Discharge Planning: Goal: Ability to manage health-related needs will improve Outcome: Adequate for Discharge   Problem: Clinical Measurements: Goal: Ability to maintain clinical measurements within normal limits will improve Outcome: Adequate for Discharge Goal: Will remain free from infection Outcome: Adequate for Discharge Goal: Diagnostic test results will improve Outcome: Adequate for Discharge Goal: Respiratory complications will improve Outcome: Adequate for Discharge Goal: Cardiovascular complication will be avoided Outcome: Adequate for Discharge   Problem: Activity: Goal: Risk for activity intolerance will decrease Outcome: Adequate for Discharge   Problem: Nutrition: Goal: Adequate nutrition will be maintained Outcome: Adequate for Discharge   Problem: Coping: Goal: Level of anxiety will decrease Outcome: Adequate for Discharge   Problem: Elimination: Goal: Will not experience complications related to bowel motility Outcome: Adequate for Discharge Goal: Will not experience complications related to urinary retention Outcome: Adequate for Discharge   Problem: Pain Managment: Goal: General experience of comfort will improve Outcome: Adequate for Discharge   Problem: Safety: Goal: Ability to remain free from injury will improve Outcome: Adequate for Discharge   Problem: Skin Integrity: Goal: Risk for impaired skin integrity will decrease Outcome: Adequate for Discharge   Problem: Education: Goal: Knowledge of secondary prevention will improve Outcome: Adequate for Discharge Goal: Knowledge of patient specific risk factors addressed and post discharge goals established will improve Outcome: Adequate for  Discharge   Problem: Nutrition: Goal: Risk of aspiration will decrease Outcome: Adequate for Discharge

## 2020-10-11 NOTE — Progress Notes (Signed)
Physical Therapy Treatment Patient Details Name: Luis [name] MRN: [id number] DOB: 1971/02/04 Today's Date: 10/11/2020    History of Present Illness Pt isa 50 y/o male transferred to [facility] with R sided weakness and vertigo on 10/09/20. CT negative. 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 artifact.  Neurology feels like imaging is consistent with artifact with possible malingering vs conversion disorder. PMH of anxiety, biventricular ICD, chronic systolic CHF, cocaine use, DM, HTN, morbid obesity , substance abuse. OSA, PTSD, Refusal of blood transfusions as patient is Jehovah's Witness, and Tobacco abuse.    PT Comments    Pt making gradual progress but continues to present with R LE weakness and numbness and dizziness.  Pt with very flat affect and providing minimal information in regards to dizziness.  Not overly open for further dizziness/vertigo testing either.  Also, pt is discharging - nursing reports pt was wanting to d/c AMA but now has been discharged.  Provided pt with HEP for LE strengthening with focus on quad strength for knee stability and ankle pumps for DF with gait, provided habituation exercises for vertigo given limited testing unable to provide further instructions.    Follow Up Recommendations  Home health PT;Supervision for mobility/OOB     Equipment Recommendations  Rolling walker with 5" wheels    Recommendations for Other Services       Precautions / Restrictions Precautions Precautions: Fall    Mobility  Bed Mobility Overal bed mobility: Needs Assistance Bed Mobility: Supine to Sit;Sit to Supine     Supine to sit: Independent Sit to supine: Independent        Transfers Overall transfer level: Needs assistance Equipment used: None Transfers: Sit to/from Stand Sit to Stand: Supervision            Ambulation/Gait Ambulation/Gait assistance: Min guard Gait Distance (Feet): 80 Feet Assistive device:  Rolling walker (2 wheeled) Gait Pattern/deviations: Step-to pattern;Decreased weight shift to right;Decreased dorsiflexion - right Gait velocity: decreased   General Gait Details: Pt putting more weight on R LE today but does tend to drag forward with decreased dorsiflexion - improved heel strike with cues.  However, also noted pt tending to lock/hyperextend R knee compared to left.  Denies any pain in R knee. Worked on trying to stand on R LE without locking knee but pt having difficulty - provided with quad strengthening exercises.   Stairs             Wheelchair Mobility    Modified Rankin (Stroke Patients Only)       Balance Overall balance assessment: Needs assistance Sitting-balance support: No upper extremity supported;Feet supported Sitting balance-Leahy Scale: Normal     Standing balance support: No upper extremity supported;Bilateral upper extremity supported Standing balance-Leahy Scale: Fair Standing balance comment: RW for gait but able to stand for ADLs without UE support                            Cognition Arousal/Alertness: Awake/alert Behavior During Therapy: Flat affect Overall Cognitive Status: Within Functional Limits for tasks assessed                                 General Comments: Pt very flat and guarded with responses and interactions.      Exercises General Exercises - Lower Extremity Ankle Circles/Pumps: AROM;Both;10 reps;Seated Quad Sets: AROM;Right;5 reps;Seated (foot propt  on therapist hand) Long Arc Quad: AAROM;Right;5 reps;Seated    General Comments General comments (skin integrity, edema, etc.):   Sensation: Reports R leg numb.  Recommended rubbing/patting/touching leg with hands and different textured items or even warm/cold rags.  Knee:  R knee locking/hyperextending with gait - provided quad sets and long arc quad HEP  Vertigo: Pt with c/o constant dizziness unless asleep.  Pt with vague/guarded  reports of symptoms and not open to lots of questions about dizziness. Did worsen with smooth pursuit.  Denied changes with head turns but also declined to do gaze stabilization or head turns.  Educated on segmental turns  And habituation exercises.   Recommended use of RW at all times  Provided with below HEP.  Access Code: [email or web address]    URL: [email or web address]   Date: 10/11/2020   Prepared by: Royetta Asal      Program Notes      For numbness in R leg: recommend rubbing/patting leg, rubbing with something hard, soft, different textures.  Could also do warm or cold rags. This can help improve sensation.         With walking focus on lifting toes and hitting R heel.           Exercises   Supine Quadricep Sets - 3 x daily - 7 x weekly - 2 sets - 10 reps   Seated Long Arc Quad - 3 x daily - 7 x weekly - 2 sets - 10 reps   Seated Ankle Pumps - 3 x daily - 7 x weekly - 2 sets - 10 reps   Seated Horizontal Smooth Pursuit - 3 x daily - 7 x weekly - 1 sets - 10 reps   Seated Vertical Smooth Pursuit - 3 x daily - 7 x weekly - 1 sets - 10 reps   Seated Gaze Stabilization with Head Rotation - 3 x daily - 7 x weekly - 1 sets - 10 reps   Seated Gaze Stabilization with Head Nod - 3 x daily - 7 x weekly - 1 sets - 10 reps      Pertinent Vitals/Pain Pain Assessment: No/denies pain    Home Living                      Prior Function            PT Goals (current goals can now be found in the care plan section) Acute Rehab PT Goals Patient Stated Goal: less dizziness PT Goal Formulation: With patient Time For Goal Achievement: 10/24/20 Potential to Achieve Goals: Good Progress towards PT goals: Progressing toward goals    Frequency    Min 3X/week      PT Plan Discharge plan needs to be updated    Co-evaluation              AM-PAC PT "6 Clicks" Mobility   Outcome Measure  Help needed turning from your back to your side while in a flat bed without using  bedrails?: None Help needed moving from lying on your back to sitting on the side of a flat bed without using bedrails?: None Help needed moving to and from a bed to a chair (including a wheelchair)?: A Little Help needed standing up from a chair using your arms (e.g., wheelchair or bedside chair)?: A Little Help needed to walk in hospital room?: A Little Help needed climbing 3-5 steps with a railing? : A Little 6 Click Score: 20  End of Session Equipment Utilized During Treatment: Gait belt Activity Tolerance: Patient tolerated treatment well Patient left: in bed;with call bell/phone within reach;with nursing/sitter in room (sitting EOB with RN present - discharging pt) Nurse Communication: Mobility status PT Visit Diagnosis: Muscle weakness (generalized) (M62.81);Unsteadiness on feet (R26.81);Difficulty in walking, not elsewhere classified (R26.2)     Time: 4431-5400 PT Time Calculation (min) (ACUTE ONLY): 25 min  Charges:  $Gait Training: 8-22 mins $Therapeutic Exercise: 8-22 mins                     Anise Salvo, PT Acute Rehab Services Pager [phone] [facility] [phone]     Rayetta Humphrey 10/11/2020, 5:23 PM

## 2020-10-11 NOTE — Progress Notes (Signed)
Occupational Therapy Treatment Patient Details Name: Luis [name] MRN: [id number] DOB: Dec 04, 1970 Today's Date: 10/11/2020    History of present illness Pt isa 50 y/o male transferred from [facility] to [facility] with R sided weakness and vertigo. CT negative. 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 artifact.  Neurology feels like imaging is consistent with artifact. PMH of anxiety, biventricular ICD, chronic systolic CHF, cocaine use, DM, HTN, morbid obesity , substance abuse. OSA, PTSD, Refusal of blood transfusions as patient is Jehovah's Witness, and Tobacco abuse.   OT comments  Pt is able to perform ADLs with supervision - requires increased time and increased effort.  He reports he still feels like his Rt side is numb and tingly, but he was able to fully use Rt UE functionally without obvious signs of deficits.  He continues with c/o dizziness.   Discussed with him, recommendation for tub seat and where he could acquire one.  Discharge recommendations updated.   Follow Up Recommendations  Home health OT;Supervision - Intermittent    Equipment Recommendations  Tub/shower seat    Recommendations for Other Services      Precautions / Restrictions Precautions Precautions: Fall       Mobility Bed Mobility               General bed mobility comments: pt sitting up in chair    Transfers Overall transfer level: Needs assistance Equipment used: Rolling walker (2 wheeled) Transfers: Sit to/from Stand;Stand Pivot Transfers Sit to Stand: Supervision Stand pivot transfers: Supervision            Balance Overall balance assessment: Needs assistance Sitting-balance support: No upper extremity supported;Feet supported Sitting balance-Leahy Scale: Good     Standing balance support: No upper extremity supported Standing balance-Leahy Scale: Fair                             ADL either performed or assessed with clinical judgement    ADL Overall ADL's : Needs assistance/impaired Eating/Feeding: Independent;Sitting           Lower Body Bathing: Supervison/ safety;Sit to/from stand       Lower Body Dressing: Supervision/safety;Sit to/from stand Lower Body Dressing Details (indicate cue type and reason): able to don/doff socks without difficulty - pt somewhat irritable when asked to perform task, and does c/o dizziness.   He reports he doesn't wear socks Toilet Transfer: Supervision/safety;Ambulation;Comfort height toilet;RW;Grab bars   Toileting- Clothing Manipulation and Hygiene: Supervision/safety       Functional mobility during ADLs: Supervision/safety;Rolling walker General ADL Comments: Pt reports fatigue with activity     Vision   Additional Comments: Pt demonstrates full EOMs.  He does loose fixation intermittently, and not consistently when tracking to the Rt.   Perception     Praxis      Cognition Arousal/Alertness: Awake/alert Behavior During Therapy: Flat affect Overall Cognitive Status: Within Functional Limits for tasks assessed                                 General Comments: Pt very flat and guarded with responses and interactions        Exercises     Shoulder Instructions       General Comments Pt reports he is ready to go home and is hopeful they will let him discharge today.  Discussed use of shower seat  for home use and where he could acquire one if he decided    Pertinent Vitals/ Pain       Pain Assessment: Faces Faces Pain Scale: No hurt  Home Living                                          Prior Functioning/Environment              Frequency  Min 2X/week        Progress Toward Goals  OT Goals(current goals can now be found in the care plan section)  Progress towards OT goals: Progressing toward goals     Plan Discharge plan needs to be updated    Co-evaluation                 AM-PAC OT "6 Clicks" Daily  Activity     Outcome Measure   Help from another person eating meals?: None Help from another person taking care of personal grooming?: A Little Help from another person toileting, which includes using toliet, bedpan, or urinal?: A Little Help from another person bathing (including washing, rinsing, drying)?: A Little Help from another person to put on and taking off regular upper body clothing?: A Little Help from another person to put on and taking off regular lower body clothing?: A Little 6 Click Score: 19    End of Session Equipment Utilized During Treatment: Gait belt;Rolling walker  OT Visit Diagnosis: Dizziness and giddiness (R42)   Activity Tolerance Patient limited by fatigue   Patient Left in chair;with call bell/phone within reach;with chair alarm set   Nurse Communication Mobility status        Time: 0454-0981 OT Time Calculation (min): 13 min  Charges: OT General Charges $OT Visit: 1 Visit OT Treatments $Therapeutic Activity: 8-22 mins  Eber Jones., OTR/L Acute Rehabilitation Services Pager [phone] Office [phone]    Jeani Hawking M 10/11/2020, 1:21 PM

## 2020-10-11 NOTE — Discharge Summary (Signed)
Physician Discharge Summary  Luis [name]:[id number] DOB: 12/17/1970 DOA: 10/09/2020  PCP: Margot Ables, MD  Admit date: 10/09/2020 Discharge date: 10/11/2020  Admitted From: Home Disposition:  home  Recommendations for Outpatient Follow-up:  1. Follow up with PCP in 1-2 weeks 2. Please obtain BMP/CBC in one week 3. Optimize glycemic control.  Goal is for A1c less than 7. 4. Continue to work on avoidance of cocaine and alcohol and tobacco.  Home Health: Yes Equipment/Devices: Rolling walker  Discharge Condition: Stable CODE STATUS: Full code Diet recommendation: Heart healthy, carb modified, low-cholesterol  Brief/Interim Summary: Luis [name] is a 50 y.o. M with HTN, sCHF EF now recovered, hx ICD, depression/PTSD, cocaine abuse, DM, and coronary disease who presented with right sided weakness.  Patient recently admitted with chest pain after cocaine use.  CTA negative, no further ischemic work up per Cardiology.  After returning home, the patient developed right sided weakness and so went to his PCP's office and was sent to the ER.  In the ER, CT head unremarkable.  Admitted for MRI and CVA work up.  Discharge Diagnoses:  Active Problems:   TIA (transient ischemic attack)  Acute stroke like symptoms MRI over read by neurology with only artifact, no intracranial large vessel occlusion or high-grade stenosis, 2 small aneurysms  -Recent echo without cardiogenic source of embolism  -Lipids ordered: continue high intensity statin Crestor -Neuro recommend continue Plavix over aspirin -Atrial fibrillation: not present on tele -tPA not given because outside window -Dysphagia screen okay for regular diet -PT eval ordered: recommended CIR  Coronary disease, secondary prevention Hypertension Ischemic cardiomyopathy with chronic systolic CHF (prior EF 25-30%, now normalized) -Continue Plavix, Crestor -Allow permissive HTN for now -Resume Coreg,  metop, Lasix  Cocaine use -Cessation recommended  Diabetes, type 2, poorly ocntrolled Glucose high normal, recent A1c >10%. -Continue SS corrections -Resume Victoza  Obesity BMI 44  Depression/PTSD -Continue Depakote -Resume prazosin for permissive HTN    OSA -Continue CPAP at night  Discharge Instructions:  Discharge Instructions    Diet - low sodium heart healthy   Complete by: As directed    Increase activity slowly   Complete by: As directed      Allergies as of 10/11/2020      Reactions   Aspirin Anaphylaxis   Throat closing   Iodine-131 Hives   Hives   Iodinated Diagnostic Agents Hives   Latex Rash, Hives   Tramadol Hives, Nausea Only   Penicillins Nausea And Vomiting   Amoxicillin-pot Clavulanate Diarrhea   Lidocaine Rash   Lisinopril Cough      Medication List    TAKE these medications   carvedilol 6.25 MG tablet Commonly known as: COREG Take 6.25 mg by mouth 2 (two) times daily with a meal.   clopidogrel 75 MG tablet Commonly known as: PLAVIX Take 75 mg by mouth daily.   clotrimazole-betamethasone cream Commonly known as: LOTRISONE Apply 1 application topically daily.   divalproex 500 MG DR tablet Commonly known as: DEPAKOTE Take 500 mg by mouth 2 (two) times a day.   furosemide 80 MG tablet Commonly known as: LASIX Take 80 mg by mouth daily.   HumaLOG Mix 75/25 KwikPen (75-25) 100 UNIT/ML Kwikpen Generic drug: Insulin Lispro Prot & Lispro Inject 20 Units into the skin 2 (two) times daily before a meal. What changed:   how much to take  when to take this   isosorbide mononitrate 30 MG 24 hr tablet Commonly known as: IMDUR Take 1 tablet (  30 mg total) by mouth daily.   meclizine 25 MG tablet Commonly known as: ANTIVERT Take 1 tablet (25 mg total) by mouth 3 (three) times daily as needed for dizziness.   metoprolol succinate 100 MG 24 hr tablet Commonly known as: TOPROL-XL Take 100 mg by mouth daily. Take with or  immediately following a meal.   multivitamin with minerals Tabs tablet Take 1 tablet by mouth daily.   ondansetron 4 MG disintegrating tablet Commonly known as: Zofran ODT Take 1 tablet (4 mg total) by mouth every 8 (eight) hours as needed for nausea or vomiting.   pantoprazole 40 MG tablet Commonly known as: PROTONIX Take 1 tablet (40 mg total) by mouth 2 (two) times daily. What changed: when to take this   prazosin 2 MG capsule Commonly known as: MINIPRESS Take 2 mg by mouth at bedtime.   rosuvastatin 20 MG tablet Commonly known as: CRESTOR Take 20 mg by mouth daily.   sertraline 50 MG tablet Commonly known as: ZOLOFT Take 100 mg by mouth daily.   tamsulosin 0.4 MG Caps capsule Commonly known as: Flomax Take 1 capsule (0.4 mg total) by mouth daily after supper.   traZODone 100 MG tablet Commonly known as: DESYREL Take 200 mg by mouth at bedtime.   Victoza 18 MG/3ML Sopn Generic drug: liraglutide Inject 1.8 mg into the skin daily.       Follow-up Information    [facility] Follow up.   Specialty: Home Health Services Why: the home health agency will contact you for the first home visit. Contact information: [address] [place] [address] [phone]        Margot Ables, MD Follow up.   Specialty: Family Medicine Contact information: [address] [place] [address] [phone]        Marykay Lex, MD .   Specialty: Cardiology Contact information: [address] [place] [address] [phone]              Allergies  Allergen Reactions  . Aspirin Anaphylaxis    Throat closing   . Iodine-131 Hives    Hives  . Iodinated Diagnostic Agents Hives  . Latex Rash and Hives  . Tramadol Hives and Nausea Only  . Penicillins Nausea And Vomiting  . Amoxicillin-Pot Clavulanate Diarrhea  . Lidocaine Rash  . Lisinopril Cough     Consultations:  Neurology   Procedures/Studies: DG Lumbar Spine Complete  Result Date: 09/27/2020 CLINICAL DATA:  Low back pain EXAM: LUMBAR SPINE - COMPLETE 4+ VIEW COMPARISON:  None. FINDINGS: Five non rib-bearing lumbar type vertebra. Lumbar alignment within normal limits. Vertebral body heights are maintained. The disc spaces are grossly patent. There are minimal degenerative osteophytes. Minor facet degenerative changes of the lower lumbar spine IMPRESSION: Minimal degenerative changes. No acute osseous abnormality. Electronically Signed   By: Jasmine Pang M.D.   On: 09/27/2020 17:06   CT Head Wo Contrast  Result Date: 10/09/2020 CLINICAL DATA:  Dizziness, right-sided weakness. EXAM: CT HEAD WITHOUT CONTRAST TECHNIQUE: Contiguous axial images were obtained from the base of the skull through the vertex without intravenous contrast. COMPARISON:  January 17, 2019. FINDINGS: Brain: No evidence of acute infarction, hemorrhage, hydrocephalus, extra-axial collection or mass lesion/mass effect. Vascular: No hyperdense vessel or unexpected calcification. Skull: Normal. Negative for fracture or focal lesion. Sinuses/Orbits: No acute finding. Other: None. IMPRESSION: Normal head CT. Electronically Signed   By: Lupita Raider M.D.   On: 10/09/2020 13:37  MR ANGIO HEAD WO CONTRAST  Result Date: 10/10/2020 CLINICAL DATA:  Neuro deficit, acute, stroke suspected; right-sided paresthesia, weakness, vertigo. EXAM: MRI HEAD WITHOUT CONTRAST MRA HEAD WITHOUT CONTRAST MRA NECK WITHOUT CONTRAST TECHNIQUE: Multiplanar, multi-echo pulse sequences of the brain and surrounding structures were acquired without intravenous contrast. Angiographic images of the Circle of Willis were acquired using MRA technique without intravenous contrast. Angiographic images of the neck were acquired using MRA technique without intravenous contrast. Carotid stenosis measurements (when applicable) are obtained utilizing NASCET  criteria, using the distal internal carotid diameter as the denominator. COMPARISON:  Head CT 10/09/2020. Brain MRI 01/19/2019. CT angiography head/neck 01/19/2019. FINDINGS: MRI HEAD FINDINGS Brain: Mild intermittent motion degradation. Mild generalized parenchymal atrophy. There is an apparent punctate focus of restricted diffusion within the left lentiform nucleus, appreciated on the axial diffusion-weighted sequence only (series 5, image 74) (not appreciated on the coronal diffusion-weighted sequence). This may reflect a punctate acute/early subacute infarct or image noise artifact. No cortical encephalomalacia is identified. No significant white matter disease. No evidence of intracranial mass. No chronic intracranial blood products. No extra-axial fluid collection. No midline shift. Vascular: Expected proximal arterial flow voids. Skull and upper cervical spine: No focal marrow lesion. Sinuses/Orbits: Visualized orbits show no acute finding. No significant paranasal sinus disease. MRA HEAD FINDINGS The examination is mildly motion degraded. Anterior circulation: The intracranial internal carotid arteries are patent. The M1 middle cerebral arteries are patent. No M2 proximal branch occlusion or high-grade proximal stenosis is identified. The anterior cerebral arteries are patent. 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 In retrospect, this was present on the prior CTA of 01/19/2019 (but is seen to better advantage on today's exam) and unchanged in size since that time. 1-2 mm inferiorly projecting vascular protrusion arising from the paraclinoid left ICA which may reflect an additional aneurysm or an infundibulum (series 9, image 74). 2-3 mm infundibulum at the origin of the right posterior communicating artery. Posterior circulation: The intracranial vertebral arteries are patent. Possible basilar artery fenestration (versus artifact from motion at this  level) the posterior cerebral arteries are patent. A right posterior communicating artery is present. The left posterior communicating artery is hypoplastic or absent. Anatomic variants: As described MRA NECK FINDINGS The examination is significantly motion degraded and this precludes adequate evaluation for stenoses within the common carotid, internal carotid and vertebral arteries within the neck. Aortic arch: Excluded from the field of view. Right carotid system: Patent with antegrade flow. Left carotid system: Patent with antegrade flow. Vertebral arteries: Patent with antegrade flow. IMPRESSION: MRI brain: 1. Mildly motion degraded examination. 2. Punctate acute/early subacute infarct within the left basal ganglia versus image noise artifact (see discussion above). 3. Mild generalized parenchymal atrophy. MRA head: 1. Mildly motion degraded examination, somewhat limiting evaluation. 2. No evidence of intracranial large vessel occlusion or proximal high-grade arterial stenosis. 3. 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 4. 1-2 mm vascular protrusion arising from the paraclinoid left internal carotid artery, which may reflect an additional aneurysm or an infundibulum. 5. Possible basilar artery fenestration (versus artifact from motion at this level). MRA neck: The common carotid, internal carotid and vertebral arteries are patent within the neck with antegrade flow. However, significant motion degradation precludes adequate evaluation for stenoses within these vessels. Electronically Signed   By: Jackey Loge DO   On: 10/10/2020 16:17   MR ANGIO NECK WO CONTRAST  Result Date: 10/10/2020 CLINICAL DATA:  Neuro deficit, acute, stroke suspected; right-sided paresthesia, weakness, vertigo. EXAM: MRI HEAD WITHOUT CONTRAST MRA HEAD WITHOUT CONTRAST MRA NECK WITHOUT CONTRAST TECHNIQUE: Multiplanar, multi-echo pulse sequences of the brain and surrounding structures were acquired  without intravenous contrast. Angiographic images of the Circle of Willis were acquired using MRA technique without intravenous contrast. Angiographic images of the neck were acquired using MRA technique without intravenous contrast. Carotid stenosis measurements (when applicable) are obtained utilizing NASCET criteria, using the distal internal carotid diameter as the denominator. COMPARISON:  Head CT 10/09/2020. Brain MRI 01/19/2019. CT angiography head/neck 01/19/2019. FINDINGS: MRI HEAD FINDINGS Brain: Mild intermittent motion degradation. Mild generalized parenchymal atrophy. There is an apparent punctate focus of restricted diffusion within the left lentiform nucleus, appreciated on the axial diffusion-weighted sequence only (series 5, image 74) (not appreciated on the coronal diffusion-weighted sequence). This may reflect a punctate acute/early subacute infarct or image noise artifact. No cortical encephalomalacia is identified. No significant white matter disease. No evidence of intracranial mass. No chronic intracranial blood products. No extra-axial fluid collection. No midline shift. Vascular: Expected proximal arterial flow voids. Skull and upper cervical spine: No focal marrow lesion. Sinuses/Orbits: Visualized orbits show no acute finding. No significant paranasal sinus disease. MRA HEAD FINDINGS The examination is mildly motion degraded. Anterior circulation: The intracranial internal carotid arteries are patent. The M1 middle cerebral arteries are patent. No M2 proximal branch occlusion or high-grade proximal stenosis is identified. The anterior cerebral arteries are patent. 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 In retrospect, this was present on the prior CTA of 01/19/2019 (but is seen to better advantage on today's exam) and unchanged in size since that time. 1-2 mm inferiorly projecting vascular protrusion arising from the paraclinoid left  ICA which may reflect an additional aneurysm or an infundibulum (series 9, image 74). 2-3 mm infundibulum at the origin of the right posterior communicating artery. Posterior circulation: The intracranial vertebral arteries are patent. Possible basilar artery fenestration (versus artifact from motion at this level) the posterior cerebral arteries are patent. A right posterior communicating artery is present. The left posterior communicating artery is hypoplastic or absent. Anatomic variants: As described MRA NECK FINDINGS The examination is significantly motion degraded and this precludes adequate evaluation for stenoses within the common carotid, internal carotid and vertebral arteries within the neck. Aortic arch: Excluded from the field of view. Right carotid system: Patent with antegrade flow. Left carotid system: Patent with antegrade flow. Vertebral arteries: Patent with antegrade flow. IMPRESSION: MRI brain: 1. Mildly motion degraded examination. 2. Punctate acute/early subacute infarct within the left basal ganglia versus image noise artifact (see discussion above). 3. Mild generalized parenchymal atrophy. MRA head: 1. Mildly motion degraded examination, somewhat limiting evaluation. 2. No evidence of intracranial large vessel occlusion or proximal high-grade arterial stenosis. 3. 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 4. 1-2 mm vascular protrusion arising from the paraclinoid left internal carotid artery, which may reflect an additional aneurysm or an infundibulum. 5. Possible basilar artery fenestration (versus artifact from motion at this level). MRA neck: The common carotid, internal carotid and vertebral arteries are patent within the neck with antegrade flow. However, significant motion degradation precludes adequate evaluation for stenoses within these vessels. Electronically Signed   By: Jackey Loge DO   On: 10/10/2020 16:17   MR BRAIN WO CONTRAST  Result Date:  10/10/2020 CLINICAL DATA:  Neuro deficit, acute, stroke suspected; right-sided paresthesia, weakness, vertigo. EXAM: MRI  HEAD WITHOUT CONTRAST MRA HEAD WITHOUT CONTRAST MRA NECK WITHOUT CONTRAST TECHNIQUE: Multiplanar, multi-echo pulse sequences of the brain and surrounding structures were acquired without intravenous contrast. Angiographic images of the Circle of Willis were acquired using MRA technique without intravenous contrast. Angiographic images of the neck were acquired using MRA technique without intravenous contrast. Carotid stenosis measurements (when applicable) are obtained utilizing NASCET criteria, using the distal internal carotid diameter as the denominator. COMPARISON:  Head CT 10/09/2020. Brain MRI 01/19/2019. CT angiography head/neck 01/19/2019. FINDINGS: MRI HEAD FINDINGS Brain: Mild intermittent motion degradation. Mild generalized parenchymal atrophy. There is an apparent punctate focus of restricted diffusion within the left lentiform nucleus, appreciated on the axial diffusion-weighted sequence only (series 5, image 74) (not appreciated on the coronal diffusion-weighted sequence). This may reflect a punctate acute/early subacute infarct or image noise artifact. No cortical encephalomalacia is identified. No significant white matter disease. No evidence of intracranial mass. No chronic intracranial blood products. No extra-axial fluid collection. No midline shift. Vascular: Expected proximal arterial flow voids. Skull and upper cervical spine: No focal marrow lesion. Sinuses/Orbits: Visualized orbits show no acute finding. No significant paranasal sinus disease. MRA HEAD FINDINGS The examination is mildly motion degraded. Anterior circulation: The intracranial internal carotid arteries are patent. The M1 middle cerebral arteries are patent. No M2 proximal branch occlusion or high-grade proximal stenosis is identified. The anterior cerebral arteries are patent. 2 mm laterally projecting  aneurysm arising from the paraclinoid left ICA (series 9, image 76). In retrospect, this was present on the prior CTA of 01/19/2019 (but is seen to better advantage on today's exam) and unchanged in size since that time. 1-2 mm inferiorly projecting vascular protrusion arising from the paraclinoid left ICA which may reflect an additional aneurysm or an infundibulum (series 9, image 74). 2-3 mm infundibulum at the origin of the right posterior communicating artery. Posterior circulation: The intracranial vertebral arteries are patent. Possible basilar artery fenestration (versus artifact from motion at this level) the posterior cerebral arteries are patent. A right posterior communicating artery is present. The left posterior communicating artery is hypoplastic or absent. Anatomic variants: As described MRA NECK FINDINGS The examination is significantly motion degraded and this precludes adequate evaluation for stenoses within the common carotid, internal carotid and vertebral arteries within the neck. Aortic arch: Excluded from the field of view. Right carotid system: Patent with antegrade flow. Left carotid system: Patent with antegrade flow. Vertebral arteries: Patent with antegrade flow. IMPRESSION: MRI brain: 1. Mildly motion degraded examination. 2. Punctate acute/early subacute infarct within the left basal ganglia versus image noise artifact (see discussion above). 3. Mild generalized parenchymal atrophy. MRA head: 1. Mildly motion degraded examination, somewhat limiting evaluation. 2. No evidence of intracranial large vessel occlusion or proximal high-grade arterial stenosis. 3. 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 4. 1-2 mm vascular protrusion arising from the paraclinoid left internal carotid artery, which may reflect an additional aneurysm or an infundibulum. 5. Possible basilar artery fenestration (versus artifact from motion at this level). MRA neck: The common  carotid, internal carotid and vertebral arteries are patent within the neck with antegrade flow. However, significant motion degradation precludes adequate evaluation for stenoses within these vessels. Electronically Signed   By: Jackey Loge DO   On: 10/10/2020 16:17   DG Chest Portable 1 View  Result Date: 09/30/2020 CLINICAL DATA:  Chest pain 04/05/2009 throbbing midsternal chest pain radiating to neck and down RIGHT arm EXAM: PORTABLE CHEST 1 VIEW COMPARISON:  December  of 2021 FINDINGS: LEFT-sided 3 lead pacer defibrillator in place as before. Trachea midline. Cardiac enlargement as before. Lungs are clear. Limited skeletal assessment is unremarkable. IMPRESSION: No active disease. Electronically Signed   By: Donzetta Kohut M.D.   On: 09/30/2020 12:42   CT ANGIO CHEST AORTA W/CM & OR WO/CM  Result Date: 09/30/2020 CLINICAL DATA:  50 year old male with chest pain. EXAM: CT ANGIOGRAPHY CHEST WITH CONTRAST TECHNIQUE: Multidetector CT imaging of the chest was performed using the standard protocol during bolus administration of intravenous contrast. Multiplanar CT image reconstructions and MIPs were obtained to evaluate the vascular anatomy. CONTRAST:  OMNIPAQUE IOHEXOL 350 MG/ML SOLN COMPARISON:  Chest radiograph dated 09/30/2020. FINDINGS: Cardiovascular: There is mild cardiomegaly. No pericardial effusion. Left pectoral AICD device. The thoracic aorta is unremarkable. The origins of the great vessels of the aortic arch appear patent as visualized. Evaluation of the pulmonary arteries is limited due to respiratory motion artifact and suboptimal opacification and timing of the contrast. No large or central pulmonary artery embolus identified. Mediastinum/Nodes: There is no hilar or mediastinal adenopathy. The esophagus and the thyroid gland are grossly unremarkable. No mediastinal fluid collection. Lungs/Pleura: The lungs are clear. There is no pleural effusion pneumothorax. The central airways are  patent. Upper Abdomen: No acute abnormality. Musculoskeletal: Degenerative changes of the spine. No acute osseous pathology. Review of the MIP images confirms the above findings. IMPRESSION: 1. No acute intrathoracic pathology. No CT evidence of central pulmonary artery embolus. 2. Mild cardiomegaly. Electronically Signed   By: Elgie Collard M.D.   On: 09/30/2020 22:40   ECHOCARDIOGRAM COMPLETE  Result Date: 10/01/2020    ECHOCARDIOGRAM REPORT   Patient Name:   KASEEM VASTINE Date of Exam: 10/01/2020 Medical Rec #:  [id number]             Height:       65.5 in Accession #:    [id number]            Weight:       270.9 lb Date of Birth:  1971-06-15              BSA:          2.263 m Patient Age:    49 years              BP:           126/62 mmHg Patient Gender: M                     HR:           69 bpm. Exam Location:  Inpatient Procedure: 2D Echo, Cardiac Doppler and Color Doppler Indications:    Chest pain; nonischemic cardiomyopathy  History:        Patient has prior history of Echocardiogram examinations, most                 recent 07/26/2019. CHF; Pacemaker.  Sonographer:    Neomia Dear RDCS Referring Phys: [id number] MARGARET E PRAY IMPRESSIONS  1. Left ventricular ejection fraction, by estimation, is 30 to 35%. The left ventricle has moderately decreased function. The left ventricle demonstrates global hypokinesis. There is moderate eccentric left ventricular hypertrophy. Left ventricular diastolic parameters are consistent with Grade II diastolic dysfunction (pseudonormalization). Elevated left atrial pressure.  2. Right ventricular systolic function is normal. The right ventricular size is normal. There is normal pulmonary artery systolic pressure. The estimated right ventricular systolic pressure is 27.8 mmHg.  3.  Left atrial size was mildly dilated.  4. The mitral valve is normal in structure. No evidence of mitral valve regurgitation.  5. The aortic valve is tricuspid. Aortic valve regurgitation is  not visualized.  6. The inferior vena cava is normal in size with greater than 50% respiratory variability, suggesting right atrial pressure of 3 mmHg. Comparison(s): Prior images reviewed side by side. The left ventricular function is worsened. FINDINGS  Left Ventricle: Left ventricular ejection fraction, by estimation, is 30 to 35%. The left ventricle has moderately decreased function. The left ventricle demonstrates global hypokinesis. The left ventricular internal cavity size was normal in size. There is moderate eccentric left ventricular hypertrophy. Abnormal (paradoxical) septal motion, consistent with RV pacemaker. Left ventricular diastolic parameters are consistent with Grade II diastolic dysfunction (pseudonormalization). Elevated left atrial pressure. Right Ventricle: The right ventricular size is normal. No increase in right ventricular wall thickness. Right ventricular systolic function is normal. There is normal pulmonary artery systolic pressure. The tricuspid regurgitant velocity is 2.49 m/s, and  with an assumed right atrial pressure of 3 mmHg, the estimated right ventricular systolic pressure is 27.8 mmHg. Left Atrium: Left atrial size was mildly dilated. Right Atrium: Right atrial size was normal in size. Pericardium: There is no evidence of pericardial effusion. Mitral Valve: The mitral valve is normal in structure. No evidence of mitral valve regurgitation. MV peak gradient, 5.0 mmHg. The mean mitral valve gradient is 2.0 mmHg. Tricuspid Valve: The tricuspid valve is normal in structure. Tricuspid valve regurgitation is trivial. Aortic Valve: The aortic valve is tricuspid. Aortic valve regurgitation is not visualized. Aortic valve mean gradient measures 8.0 mmHg. Aortic valve peak gradient measures 14.1 mmHg. Aortic valve area, by VTI measures 1.29 cm. Pulmonic Valve: The pulmonic valve was grossly normal. Pulmonic valve regurgitation is not visualized. Aorta: The aortic root and ascending  aorta are structurally normal, with no evidence of dilitation. Venous: The inferior vena cava is normal in size with greater than 50% respiratory variability, suggesting right atrial pressure of 3 mmHg. IAS/Shunts: No atrial level shunt detected by color flow Doppler. Additional Comments: A device lead is visualized.  LEFT VENTRICLE PLAX 2D LVIDd:         5.30 cm      Diastology LVIDs:         4.80 cm      LV e' medial:    7.07 cm/s LV PW:         1.60 cm      LV E/e' medial:  13.8 LV IVS:        1.40 cm      LV e' lateral:   5.98 cm/s LVOT diam:     1.80 cm      LV E/e' lateral: 16.3 LV SV:         45 LV SV Index:   20 LVOT Area:     2.54 cm  LV Volumes (MOD) LV vol d, MOD A2C: 134.0 ml LV vol d, MOD A4C: 129.0 ml LV vol s, MOD A2C: 91.3 ml LV vol s, MOD A4C: 105.0 ml LV SV MOD A2C:     42.7 ml LV SV MOD A4C:     129.0 ml LV SV MOD BP:      28.5 ml RIGHT VENTRICLE RV S prime:     20.10 cm/s  PULMONARY VEINS TAPSE (M-mode): 2.3 cm      A Reversal Duration: 120.00 msec  A Reversal Velocity: 20.60 cm/s                             Diastolic Velocity:  24.70 cm/s                             S/D Velocity:        1.70                             Systolic Velocity:   41.10 cm/s LEFT ATRIUM             Index       RIGHT ATRIUM           Index LA diam:        3.70 cm 1.63 cm/m  RA Area:     17.20 cm LA Vol (A2C):   73.5 ml 32.47 ml/m RA Volume:   43.80 ml  19.35 ml/m LA Vol (A4C):   49.1 ml 21.69 ml/m LA Biplane Vol: 60.6 ml 26.77 ml/m  AORTIC VALVE                    PULMONIC VALVE AV Area (Vmax):    1.20 cm     PV Vmax:       1.36 m/s AV Area (Vmean):   1.26 cm     PV Vmean:      86.800 cm/s AV Area (VTI):     1.29 cm     PV VTI:        0.232 m AV Vmax:           188.00 cm/s  PV Peak grad:  7.3 mmHg AV Vmean:          133.000 cm/s PV Mean grad:  3.0 mmHg AV VTI:            0.348 m AV Peak Grad:      14.1 mmHg AV Mean Grad:      8.0 mmHg LVOT Vmax:         88.90 cm/s LVOT Vmean:         65.700 cm/s LVOT VTI:          0.176 m LVOT/AV VTI ratio: 0.51  AORTA Ao Root diam: 3.10 cm Ao Asc diam:  2.70 cm MITRAL VALVE               TRICUSPID VALVE MV Area (PHT): 3.48 cm    TR Peak grad:   24.8 mmHg MV Area VTI:   1.26 cm    TR Vmax:        249.00 cm/s MV Peak grad:  5.0 mmHg MV Mean grad:  2.0 mmHg    SHUNTS MV Vmax:       1.12 m/s    Systemic VTI:  0.18 m MV Vmean:      71.9 cm/s   Systemic Diam: 1.80 cm MV Decel Time: 218 msec MR Peak grad: 72.9 mmHg MR Mean grad: 46.0 mmHg MR Vmax:      427.00 cm/s MR Vmean:     322.0 cm/s MV E velocity: 97.50 cm/s MV A velocity: 86.70 cm/s MV E/A ratio:  1.12 Mihai Croitoru MD Electronically signed by Thurmon FairMihai Croitoru MD Signature Date/Time: 10/01/2020/6:14:18 PM    Final        Subjective: Feels well today.  He is  asking to go home.  Discharge Exam: Vitals:   10/11/20 1145 10/11/20 1545  BP: 136/71 (!) 156/85  Pulse: 74 85  Resp: 18 18  Temp: 98.4 F (36.9 C) 98.9 F (37.2 C)  SpO2: 97% 97%   Vitals:   10/11/20 0316 10/11/20 0750 10/11/20 1145 10/11/20 1545  BP: 140/70 (!) 147/74 136/71 (!) 156/85  Pulse: 70 62 74 85  Resp: 18 18 18 18   Temp: 97.7 F (36.5 C) 98.6 F (37 C) 98.4 F (36.9 C) 98.9 F (37.2 C)  TempSrc: Oral Oral Oral Oral  SpO2: 97% 94% 97% 97%  Weight:      Height:        General: Pt is alert, awake, not in acute distress Cardiovascular: RRR, S1/S2 +, no rubs, no gallops Respiratory: CTA bilaterally, no wheezing, no rhonchi Abdominal: Soft, NT, ND, bowel sounds + Extremities: no edema, no cyanosis Neurological: No residual weakness noted    The results of significant diagnostics from this hospitalization (including imaging, microbiology, ancillary and laboratory) are listed below for reference.     Microbiology: Recent Results (from the past 240 hour(s))  Resp Panel by RT-PCR (Flu A&B, Covid) Nasopharyngeal Swab     Status: None   Collection Time: 10/09/20  3:55 PM   Specimen: Nasopharyngeal Swab;  Nasopharyngeal(NP) swabs in vial transport medium  Result Value Ref Range Status   SARS Coronavirus 2 by RT PCR NEGATIVE NEGATIVE Final    Comment: (NOTE) SARS-CoV-2 target nucleic acids are NOT DETECTED.  The SARS-CoV-2 RNA is generally detectable in upper respiratory specimens during the acute phase of infection. The lowest concentration of SARS-CoV-2 viral copies this assay can detect is 138 copies/mL. A negative result does not preclude SARS-Cov-2 infection and should not be used as the sole basis for treatment or other patient management decisions. A negative result may occur with  improper specimen collection/handling, submission of specimen other than nasopharyngeal swab, presence of viral mutation(s) within the areas targeted by this assay, and inadequate number of viral copies(<138 copies/mL). A negative result must be combined with clinical observations, patient history, and epidemiological information. The expected result is Negative.  Fact Sheet for Patients:  BloggerCourse.com  Fact Sheet for Healthcare Providers:  SeriousBroker.it  This test is no t yet approved or cleared by the [place] FDA and  has been authorized for detection and/or diagnosis of SARS-CoV-2 by FDA under an Emergency Use Authorization (EUA). This EUA will remain  in effect (meaning this test can be used) for the duration of the COVID-19 declaration under Section 564(b)(1) of the Act, 21 U.S.C.section 360bbb-3(b)(1), unless the authorization is terminated  or revoked sooner.       Influenza A by PCR NEGATIVE NEGATIVE Final   Influenza B by PCR NEGATIVE NEGATIVE Final    Comment: (NOTE) The Xpert Xpress SARS-CoV-2/FLU/RSV plus assay is intended as an aid in the diagnosis of influenza from Nasopharyngeal swab specimens and should not be used as a sole basis for treatment. Nasal washings and aspirates are unacceptable for Xpert Xpress  SARS-CoV-2/FLU/RSV testing.  Fact Sheet for Patients: BloggerCourse.com  Fact Sheet for Healthcare Providers: SeriousBroker.it  This test is not yet approved or cleared by the [place] FDA and has been authorized for detection and/or diagnosis of SARS-CoV-2 by FDA under an Emergency Use Authorization (EUA). This EUA will remain in effect (meaning this test can be used) for the duration of the COVID-19 declaration under Section 564(b)(1) of the Act, 21 U.S.C. section 360bbb-3(b)(1), unless  the authorization is terminated or revoked.  Performed at [facility], [address] [place], [place] [address]      Labs: BNP (last 3 results) Recent Labs    09/30/20 1744  BNP 121.6*   Basic Metabolic Panel: Recent Labs  Lab 10/09/20 1223  NA 139  K 4.4  CL 105  CO2 26  GLUCOSE 207*  BUN 12  CREATININE 0.94  CALCIUM 8.8*   CBC: Recent Labs  Lab 10/09/20 1223  WBC 10.4  HGB 15.1  HCT 45.3  MCV 89.7  PLT 213   CBG: Recent Labs  Lab 10/10/20 1630 10/10/20 2243 10/11/20 0630 10/11/20 1141 10/11/20 1548  GLUCAP 184* 158* 169* 196* 158*   Lipid Profile Recent Labs    10/10/20 0238  CHOL 96  HDL 29*  LDLCALC 29  TRIG 751*  CHOLHDL 3.3   Urinalysis    Component Value Date/Time   COLORURINE YELLOW 10/09/2020 1400   APPEARANCEUR CLEAR 10/09/2020 1400   LABSPEC 1.016 10/09/2020 1400   PHURINE 5.0 10/09/2020 1400   GLUCOSEU NEGATIVE 10/09/2020 1400   HGBUR NEGATIVE 10/09/2020 1400   BILIRUBINUR NEGATIVE 10/09/2020 1400   KETONESUR 5 (A) 10/09/2020 1400   PROTEINUR NEGATIVE 10/09/2020 1400   NITRITE NEGATIVE 10/09/2020 1400   LEUKOCYTESUR NEGATIVE 10/09/2020 1400   Sepsis Labs Microbiology Recent Results (from the past 240 hour(s))  Resp Panel by RT-PCR (Flu A&B, Covid) Nasopharyngeal Swab     Status: None   Collection Time: 10/09/20  3:55 PM   Specimen: Nasopharyngeal Swab;  Nasopharyngeal(NP) swabs in vial transport medium  Result Value Ref Range Status   SARS Coronavirus 2 by RT PCR NEGATIVE NEGATIVE Final    Comment: (NOTE) SARS-CoV-2 target nucleic acids are NOT DETECTED.  The SARS-CoV-2 RNA is generally detectable in upper respiratory specimens during the acute phase of infection. The lowest concentration of SARS-CoV-2 viral copies this assay can detect is 138 copies/mL. A negative result does not preclude SARS-Cov-2 infection and should not be used as the sole basis for treatment or other patient management decisions. A negative result may occur with  improper specimen collection/handling, submission of specimen other than nasopharyngeal swab, presence of viral mutation(s) within the areas targeted by this assay, and inadequate number of viral copies(<138 copies/mL). A negative result must be combined with clinical observations, patient history, and epidemiological information. The expected result is Negative.  Fact Sheet for Patients:  BloggerCourse.com  Fact Sheet for Healthcare Providers:  SeriousBroker.it  This test is no t yet approved or cleared by the [place] FDA and  has been authorized for detection and/or diagnosis of SARS-CoV-2 by FDA under an Emergency Use Authorization (EUA). This EUA will remain  in effect (meaning this test can be used) for the duration of the COVID-19 declaration under Section 564(b)(1) of the Act, 21 U.S.C.section 360bbb-3(b)(1), unless the authorization is terminated  or revoked sooner.       Influenza A by PCR NEGATIVE NEGATIVE Final   Influenza B by PCR NEGATIVE NEGATIVE Final    Comment: (NOTE) The Xpert Xpress SARS-CoV-2/FLU/RSV plus assay is intended as an aid in the diagnosis of influenza from Nasopharyngeal swab specimens and should not be used as a sole basis for treatment. Nasal washings and aspirates are unacceptable for Xpert Xpress  SARS-CoV-2/FLU/RSV testing.  Fact Sheet for Patients: BloggerCourse.com  Fact Sheet for Healthcare Providers: SeriousBroker.it  This test is not yet approved or cleared by the [place] and has been authorized for  detection and/or diagnosis of SARS-CoV-2 by FDA under an Emergency Use Authorization (EUA). This EUA will remain in effect (meaning this test can be used) for the duration of the COVID-19 declaration under Section 564(b)(1) of the Act, 21 U.S.C. section 360bbb-3(b)(1), unless the authorization is terminated or revoked.  Performed at [facility], [address] [place], [place] [address]      Time coordinating discharge: Over 30 minutes  SIGNED:   Reva Bores, MD  [facility] 10/11/2020, 4:14 PM  If 7PM-7AM, please contact night-coverage

## 2020-10-11 NOTE — Discharge Instructions (Signed)

## 2020-10-11 NOTE — TOC Progression Note (Signed)
Transition of Care [facility]) - Progression Note    Patient Details  Name: Luis [name] MRN: [id number] Date of Birth: [birth year]  [facility]) CM/SW Contact  Carley Hammed, [place] Phone Number: 10/11/2020, 1:14 PM  Clinical Narrative:    CSW and MD discussed pt's disposition and requested PT/OT to follow up with updated recomendations. TOC will follow to assist with transition to the next appropriate level of care.        Expected Discharge Plan and Services           Expected Discharge Date:  (unknown)                                     Social Determinants of Health (SDOH) Interventions    Readmission Risk Interventions No flowsheet data found.

## 2020-10-11 NOTE — Progress Notes (Signed)
Inpatient Rehab Admissions Coordinator Note:   Per therapy recommendations, pt was screened for CIR candidacy by Estill Dooms, PT, DPT.  At this time note no acute process that would support medical necessity requirement for CIR.  Would not recommend a consult at this time.  Please contact me with questions.   Estill Dooms, PT, DPT [phone] 10/11/20 11:00 AM

## 2020-10-11 NOTE — TOC Progression Note (Signed)
Transition of Care [facility]) - Progression Note    Patient Details  Name: Luis [name] MRN: [id number] Date of Birth: 1970-12-04  Transition of Care [facility]) CM/SW Contact  Kermit Balo, RN Phone Number: 10/11/2020, 1:46 PM  Clinical Narrative:    Patient was active with [facility] services prior to admission. He is interested in continuing with them if PT feels he can d/c home with [facility] services. CM has updated [facility] of admission and to watch for [facility] orders. Pt has walker at the bedside for home.  Awaiting PT re-eval.  TOC following.  Expected Discharge Plan: Home w Home Health Services Barriers to Discharge: Continued Medical Work up  Expected Discharge Plan and Services Expected Discharge Plan: Home w Home Health Services   Discharge Planning Services: CM Consult   Living arrangements for the past 2 months: Apartment Expected Discharge Date:  (unknown)                         HH Arranged: PT,OT,Speech Therapy,Nurse's Aide HH Agency: [facility] Date [facility] Agency Contacted: 10/11/20   Representative spoke with at [facility] Agency: Arline Asp   Social Determinants of Health (SDOH) Interventions    Readmission Risk Interventions No flowsheet data found.

## 2020-11-15 ENCOUNTER — Other Ambulatory Visit: Payer: Self-pay

## 2020-11-15 ENCOUNTER — Ambulatory Visit (INDEPENDENT_AMBULATORY_CARE_PROVIDER_SITE_OTHER)
Admission: EM | Admit: 2020-11-15 | Discharge: 2020-11-15 | Disposition: A | Discharge status: Other Acute Inpatient Hospital | Payer: Medicare Other | Source: Home / Self Care

## 2020-11-15 ENCOUNTER — Emergency Department (HOSPITAL_COMMUNITY)
Admission: EM | Admit: 2020-11-15 | Discharge: 2020-11-17 | Disposition: A | Discharge status: Home / Self Care | Payer: Medicare Other | Attending: Emergency Medicine | Admitting: Emergency Medicine

## 2020-11-15 DIAGNOSIS — F332 Major depressive disorder, recurrent severe without psychotic features: Secondary | ICD-10-CM | POA: Insufficient documentation

## 2020-11-15 DIAGNOSIS — Z794 Long term (current) use of insulin: Secondary | ICD-10-CM | POA: Insufficient documentation

## 2020-11-15 DIAGNOSIS — F1490 Cocaine use, unspecified, uncomplicated: Secondary | ICD-10-CM | POA: Insufficient documentation

## 2020-11-15 DIAGNOSIS — F1994 Other psychoactive substance use, unspecified with psychoactive substance-induced mood disorder: Secondary | ICD-10-CM | POA: Insufficient documentation

## 2020-11-15 DIAGNOSIS — I110 Hypertensive heart disease with heart failure: Secondary | ICD-10-CM | POA: Insufficient documentation

## 2020-11-15 DIAGNOSIS — F17210 Nicotine dependence, cigarettes, uncomplicated: Secondary | ICD-10-CM | POA: Diagnosis not present

## 2020-11-15 DIAGNOSIS — Z9151 Personal history of suicidal behavior: Secondary | ICD-10-CM | POA: Insufficient documentation

## 2020-11-15 DIAGNOSIS — E1165 Type 2 diabetes mellitus with hyperglycemia: Secondary | ICD-10-CM | POA: Insufficient documentation

## 2020-11-15 DIAGNOSIS — Z7902 Long term (current) use of antithrombotics/antiplatelets: Secondary | ICD-10-CM | POA: Insufficient documentation

## 2020-11-15 DIAGNOSIS — Z79899 Other long term (current) drug therapy: Secondary | ICD-10-CM | POA: Diagnosis not present

## 2020-11-15 DIAGNOSIS — F1424 Cocaine dependence with cocaine-induced mood disorder: Secondary | ICD-10-CM | POA: Diagnosis present

## 2020-11-15 DIAGNOSIS — F4310 Post-traumatic stress disorder, unspecified: Secondary | ICD-10-CM | POA: Insufficient documentation

## 2020-11-15 DIAGNOSIS — I5022 Chronic systolic (congestive) heart failure: Secondary | ICD-10-CM | POA: Insufficient documentation

## 2020-11-15 DIAGNOSIS — Z20822 Contact with and (suspected) exposure to covid-19: Secondary | ICD-10-CM | POA: Insufficient documentation

## 2020-11-15 DIAGNOSIS — R45851 Suicidal ideations: Secondary | ICD-10-CM | POA: Insufficient documentation

## 2020-11-15 DIAGNOSIS — Z91040 Latex allergy status: Secondary | ICD-10-CM | POA: Insufficient documentation

## 2020-11-15 DIAGNOSIS — F322 Major depressive disorder, single episode, severe without psychotic features: Secondary | ICD-10-CM

## 2020-11-15 LAB — CBC WITH DIFFERENTIAL/PLATELET
Abs Immature Granulocytes: 0.02 10*3/uL (ref 0.00–0.07)
Basophils Absolute: 0 10*3/uL (ref 0.0–0.1)
Basophils Relative: 0 %
Eosinophils Absolute: 0.2 10*3/uL (ref 0.0–0.5)
Eosinophils Relative: 2 %
HCT: 43 % (ref 39.0–52.0)
Hemoglobin: 14 g/dL (ref 13.0–17.0)
Immature Granulocytes: 0 %
Lymphocytes Relative: 36 %
Lymphs Abs: 3 10*3/uL (ref 0.7–4.0)
MCH: 29.7 pg (ref 26.0–34.0)
MCHC: 32.6 g/dL (ref 30.0–36.0)
MCV: 91.3 fL (ref 80.0–100.0)
Monocytes Absolute: 0.8 10*3/uL (ref 0.1–1.0)
Monocytes Relative: 9 %
Neutro Abs: 4.3 10*3/uL (ref 1.7–7.7)
Neutrophils Relative %: 53 %
Platelets: 166 10*3/uL (ref 150–400)
RBC: 4.71 MIL/uL (ref 4.22–5.81)
RDW: 12.8 % (ref 11.5–15.5)
WBC: 8.2 10*3/uL (ref 4.0–10.5)
nRBC: 0 % (ref 0.0–0.2)

## 2020-11-15 LAB — VALPROIC ACID LEVEL: Valproic Acid Lvl: 18 ug/mL — ABNORMAL LOW (ref 50.0–100.0)

## 2020-11-15 LAB — COMPREHENSIVE METABOLIC PANEL
ALT: 17 U/L (ref 0–44)
AST: 17 U/L (ref 15–41)
Albumin: 3.2 g/dL — ABNORMAL LOW (ref 3.5–5.0)
Alkaline Phosphatase: 48 U/L (ref 38–126)
Anion gap: 10 (ref 5–15)
BUN: 14 mg/dL (ref 6–20)
CO2: 23 mmol/L (ref 22–32)
Calcium: 8.4 mg/dL — ABNORMAL LOW (ref 8.9–10.3)
Chloride: 102 mmol/L (ref 98–111)
Creatinine, Ser: 1.04 mg/dL (ref 0.61–1.24)
GFR, Estimated: >60 mL/min (ref >60)
Glucose, Bld: 321 mg/dL — ABNORMAL HIGH (ref 70–99)
Potassium: 4 mmol/L (ref 3.5–5.1)
Sodium: 135 mmol/L (ref 135–145)
Total Bilirubin: 0.4 mg/dL (ref 0.3–1.2)
Total Protein: 5.6 g/dL — ABNORMAL LOW (ref 6.5–8.1)

## 2020-11-15 LAB — ETHANOL: Alcohol, Ethyl (B): <10 mg/dL (ref <10)

## 2020-11-15 LAB — RESP PANEL BY RT-PCR (FLU A&B, COVID) ARPGX2
Influenza A by PCR: NEGATIVE
Influenza B by PCR: NEGATIVE
SARS Coronavirus 2 by RT PCR: NEGATIVE

## 2020-11-15 MED ORDER — ADULT MULTIVITAMIN W/MINERALS CH
1.0000 | ORAL_TABLET | Freq: Every day | ORAL | Status: DC
  Administered 2020-11-16: 1 via ORAL
  Filled 2020-11-15: qty 1

## 2020-11-15 MED ORDER — SERTRALINE HCL 50 MG PO TABS
150.0000 mg | ORAL_TABLET | Freq: Every day | ORAL | Status: DC
  Administered 2020-11-16: 150 mg via ORAL
  Filled 2020-11-15 (×3): qty 1

## 2020-11-15 MED ORDER — METOPROLOL SUCCINATE ER 25 MG PO TB24
100.0000 mg | ORAL_TABLET | Freq: Every day | ORAL | Status: DC
  Administered 2020-11-16: 100 mg via ORAL
  Filled 2020-11-15: qty 4

## 2020-11-15 MED ORDER — DIVALPROEX SODIUM 250 MG PO DR TAB
500.0000 mg | DELAYED_RELEASE_TABLET | Freq: Two times a day (BID) | ORAL | Status: DC
  Administered 2020-11-16 (×3): 500 mg via ORAL
  Filled 2020-11-15 (×3): qty 2

## 2020-11-15 MED ORDER — TRAZODONE HCL 50 MG PO TABS
200.0000 mg | ORAL_TABLET | Freq: Every day | ORAL | Status: DC
  Administered 2020-11-16 (×2): 200 mg via ORAL
  Filled 2020-11-15 (×2): qty 4

## 2020-11-15 MED ORDER — CLOPIDOGREL BISULFATE 75 MG PO TABS
75.0000 mg | ORAL_TABLET | Freq: Every day | ORAL | Status: DC
  Administered 2020-11-16: 75 mg via ORAL
  Filled 2020-11-15: qty 1

## 2020-11-15 MED ORDER — ROSUVASTATIN CALCIUM 20 MG PO TABS
20.0000 mg | ORAL_TABLET | Freq: Every evening | ORAL | Status: DC
  Administered 2020-11-16: 20 mg via ORAL
  Filled 2020-11-15: qty 1

## 2020-11-15 MED ORDER — INSULIN ASPART PROT & ASPART (70-30 MIX) 100 UNIT/ML ~~LOC~~ SUSP
60.0000 [IU] | Freq: Three times a day (TID) | SUBCUTANEOUS | Status: DC
  Administered 2020-11-16: 60 [IU] via SUBCUTANEOUS
  Filled 2020-11-15: qty 10

## 2020-11-15 MED ORDER — LIRAGLUTIDE 18 MG/3ML ~~LOC~~ SOPN
1.8000 mg | PEN_INJECTOR | Freq: Every day | SUBCUTANEOUS | Status: DC
  Administered 2020-11-16: 1.8 mg via SUBCUTANEOUS
  Filled 2020-11-15 (×2): qty 3

## 2020-11-15 MED ORDER — FUROSEMIDE 20 MG PO TABS
80.0000 mg | ORAL_TABLET | Freq: Every day | ORAL | Status: DC
  Administered 2020-11-16: 80 mg via ORAL
  Filled 2020-11-15: qty 4

## 2020-11-15 NOTE — Discharge Instructions (Addendum)
Patient is instructed to take all prescribed medications as recommended. Report any side effects or adverse reactions to your outpatient psychiatrist. Patient is instructed to abstain from alcohol and illegal drugs while on prescription medications. In the event of worsening symptoms, patient is instructed to call the crisis hotline, 911, or go to the nearest emergency department for evaluation and treatment.   

## 2020-11-15 NOTE — BH Assessment (Signed)
Pt to [facility] voluntarily with chief complaint of SI for the past three days and worsening depression. Pt does not give a specific plan to SI but reports "if I get high then a plan will come to me".  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 Patient reports x3 suicidal attempts and has hx of inpatient treatment. Pt denies HI, AVH.  Pt is urgent

## 2020-11-15 NOTE — ED Notes (Signed)
Pt dressed into maroon scrubs and all belongings were removed from the room. Pt was provided with a sandwich, crackers, sprite, and two blankets.

## 2020-11-15 NOTE — ED Triage Notes (Signed)
Pt BIB safe transport from [facility]. States suicidal ideation and cocaine & ETOH use. Pt states he uses CPAP at night and couldn't stay at [facility] due to this necessity.

## 2020-11-15 NOTE — Progress Notes (Signed)
CSW met with the patient to go over inpatient rehab facilities for substance use.  CSW contacted the following:  [facility] CSW left HIPAA compliant voicemail  [facility] Pt declined.  [facility] Does not accept pt's insurances and has a 3 week waitlist.  [facility] No beds available until mid-June.  [facility] Does not accept pts isnruance.  Out of pocket is 17,500.  [facility] accepted pt for Sunday.  Pt will need to complete the screening, it was stopped when pt mentioned SI.  Pt will have to be psych cleared by a psychiatrist before admission.  Call [phone] and ask for [facility].  Assunta Curtis, MSW, LCSW 11/15/2020 3:45 PM
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
## 2020-11-15 NOTE — BH Assessment (Signed)
Comprehensive Clinical Assessment (CCA) Note  11/15/2020 Luis [name] [id number]  Disposition: Per Luis Gum, MD, patient is recommended for overnight observation in ED due to patien requiring CPAP machine and reports use since he was 50 yo. [facility] does not have the ability to elevate the recliners that patient sleeps on and the units do not have outlets and units are not allowed to have cords due to patient safety. However, due to patient current SI, inability to contract for safety, hx of PTSD, and hx of prior suicide attempt patient does meet criteria for observation.   Flowsheet Row ED from 11/15/2020 in [facility] ED to Hosp-Admission (Discharged) from 10/09/2020 in [facility] ED to Hosp-Admission (Discharged) from 09/30/2020 in [facility] 3E HF PCU  C-SSRS RISK CATEGORY Moderate Risk No Risk No Risk      The patient demonstrates the following risk factors for suicide: Chronic risk factors for suicide include: psychiatric disorder of MDD, substance use disorder and previous suicide attempts x3. Acute risk factors for suicide include: loss (financial, interpersonal, professional). Protective factors for this patient include: responsibility to others (children, family). Considering these factors, the overall suicide risk at this point appears to be moderate. Patient is not appropriate for outpatient follow up.   Luis [name] is a 50 year old male presenting to [facility] voluntarily with chief complaint of SI for the past three days and worsening depression. Patient does not give a specific plan to SI but reports "if I get high then a plan will come to me".  Pt reports using cocaine daily for the past month and today he attempted to get treatment at [facility] but couldn't. Patient reports x3 suicidal attempts by poisoning, OD and shooting himself. Patient reports multiple inpatient hospitalizations and was receiving outpatient services  at [facility]. Patient not getting medications from his PCP which he reports compliance with but states that it is not helping his depression. Patient reports stressors related to his wife passing away from lupus last year, relapsing on cocaine last month and feeling lonely at home.  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 Patient endorses SI and does not contract for safety. Pt denies HI and AVH.  Chief Complaint:  Chief Complaint  Patient presents with  . Addiction Problem  . Suicidal  . Urgent Emergent Evaluation   Visit Diagnosis:  Substance induced mood disorder (HCC)  PTSD (post-traumatic stress disorder)  Current severe episode of major depressive disorder without psychotic features, unspecified whether recurrent (HCC)       CCA Screening, Triage and Referral (STR)  Patient Reported Information How did you hear about [place]? No data recorded Referral name: No data recorded Referral phone number: No data recorded  Whom do you see for routine medical problems? No data recorded Practice/Facility Name: No data recorded Practice/Facility Phone Number: No data recorded Name of Contact: No data recorded Contact Number: No data recorded Contact Fax Number: No data recorded Prescriber Name: No data recorded Prescriber Address (if known): No data recorded  What Is the Reason for Your Visit/Call Today? SI  How Long Has This Been Causing You Problems? 1-6 months  What Do You Feel Would Help You the Most Today? No data recorded  Have You Recently Been in Any Inpatient Treatment (Hospital/Detox/Crisis Center/28-Day Program)? No data recorded Name/Location of Program/Hospital:No data recorded How Long Were You There? No data recorded When Were You  Discharged? No data recorded  Have You Ever Received Services From [facility] Before? No data recorded Who Do You See at [facility]? No data recorded  Have You  Recently Had Any Thoughts About Hurting Yourself? Yes  Are You Planning to Commit Suicide/Harm Yourself At This time? No   Have you Recently Had Thoughts About Hurting Someone Luis [name]? No  Explanation: No data recorded  Have You Used Any Alcohol or Drugs in the Past 24 Hours? Yes  How Long Ago Did You Use Drugs or Alcohol? No data recorded What Did You Use and How Much? No data recorded  Do You Currently Have a Therapist/Psychiatrist? No data recorded Name of Therapist/Psychiatrist: No data recorded  Have You Been Recently Discharged From Any Office Practice or Programs? No data recorded Explanation of Discharge From Practice/Program: No data recorded    CCA Screening Triage Referral Assessment Type of Contact: No data recorded Is this Initial or Reassessment? No data recorded Date Telepsych consult ordered in [facility]:  No data recorded Time Telepsych consult ordered in [facility]:  No data recorded  Patient Reported Information Reviewed? No data recorded Patient Left Without Being Seen? No data recorded Reason for Not Completing Assessment: No data recorded  Collateral Involvement: No data recorded  Does Patient Have a Court Appointed Legal Guardian? No data recorded Name and Contact of Legal Guardian: No data recorded If Minor and Not Living with Parent(s), Who has Custody? No data recorded Is CPS involved or ever been involved? No data recorded Is APS involved or ever been involved? No data recorded  Patient Determined To Be At Risk for Harm To Self or Others Based on Review of Patient Reported Information or Presenting Complaint? No data recorded Method: No data recorded Availability of Means: No data recorded Intent: No data recorded Notification Required: No data recorded Additional Information for Danger to Others Potential: No data recorded Additional Comments for Danger to Others Potential: No data recorded Are There Guns or Other Weapons in Your Home? No data recorded Types  of Guns/Weapons: No data recorded Are These Weapons Safely Secured?                            No data recorded Who Could Verify You Are Able To Have These Secured: No data recorded Do You Have any Outstanding Charges, Pending Court Dates, Parole/Probation? No data recorded Contacted To Inform of Risk of Harm To Self or Others: No data recorded  Location of Assessment: No data recorded  Does Patient Present under Involuntary Commitment? No data recorded IVC Papers Initial File Date: No data recorded  [place] of Residence: No data recorded  Patient Currently Receiving the Following Services: No data recorded  Determination of Need: Urgent (48 hours)   Options For Referral: Inpatient Hospitalization; Outpatient Therapy; Medication Management     CCA Biopsychosocial Intake/Chief Complaint:  SI  Current Symptoms/Problems: worsening depression, feeling hopeless, helpless, diffiuclty sleeping, substance use.   Patient Reported Schizophrenia/Schizoaffective Diagnosis in Past: No   Strengths: NA  Preferences: NA  Abilities: NA   Type of Services Patient Feels are Needed: inpt   Initial Clinical Notes/Concerns: No data recorded  Mental Health Symptoms Depression:  Hopelessness; Sleep (too much or little); Change in energy/activity; Difficulty Concentrating; Worthlessness   Duration of Depressive symptoms: Greater than two weeks   Mania:  N/A   Anxiety:   N/A   Psychosis:  None   Duration of Psychotic symptoms: No data  recorded  Trauma:  N/A   Obsessions:  N/A   Compulsions:  N/A   Inattention:  N/A   Hyperactivity/Impulsivity:  N/A   Oppositional/Defiant Behaviors:  N/A   Emotional Irregularity:  N/A   Other Mood/Personality Symptoms:  No data recorded   Mental Status Exam Appearance and self-care  Stature:  Average   Weight:  Overweight   Clothing:  Neat/clean; Age-appropriate   Grooming:  Normal   Cosmetic use:  No data recorded   Posture/gait:  Normal   Motor activity:  Not Remarkable   Sensorium  Attention:  Normal   Concentration:  Normal   Orientation:  X5   Recall/memory:  Normal   Affect and Mood  Affect:  Appropriate; Full Range   Mood:  Euthymic   Relating  Eye contact:  Normal   Facial expression:  Responsive   Attitude toward examiner:  Cooperative   Thought and Language  Speech flow: Clear and Coherent   Thought content:  Appropriate to Mood and Circumstances   Preoccupation:  None   Hallucinations:  None   Organization:  No data recorded  [employer] of Knowledge:  Good   Intelligence:  Average   Abstraction:  Normal   Judgement:  Fair   Dance movement psychotherapist:  Adequate   Insight:  Good   Decision Making:  Normal   Social Functioning  Social Maturity:  Responsible   Social Judgement:  Normal   Stress  Stressors:  Illness; Grief/losses   Coping Ability:  Normal   Skill Deficits:  None   Supports:  Support needed     Religion:    Leisure/Recreation:    Exercise/Diet: Exercise/Diet Do You Have Any Trouble Sleeping?: Yes   CCA Employment/Education Employment/Work Situation: Employment / Work Situation Employment situation: On disability Why is patient on disability: medical How long has patient been on disability: NA What is the longest time patient has a held a job?: NA Where was the patient employed at that time?: NA Has patient ever been in the [employer]?: Yes (Describe in comment) ([employer] 9/91-7/93.)  Education: Education Is Patient Currently Attending School?: No   CCA Family/Childhood History Family and Relationship History: Family history Marital status: Widowed Widowed, when?: last year What is your sexual orientation?: NA Has your sexual activity been affected by drugs, alcohol, medication, or emotional stress?: NA Does patient have children?: Yes How many children?: 7 How is patient's relationship with their children?:  son died at 30 1/2 years old about 28 years ago  Childhood History:  Childhood History Additional childhood history information: NA Description of patient's relationship with caregiver when they were a child: NA Patient's description of current relationship with people who raised him/her: NA How were you disciplined when you got in trouble as a child/adolescent?: NA  Child/Adolescent Assessment:     CCA Substance Use Alcohol/Drug Use: Alcohol / Drug Use Pain Medications: Please see MAR Prescriptions: Please see MAR Over the Counter: Please see MAR History of alcohol / drug use?: Yes Longest period of sobriety (when/how long): Unknown Substance #1 Name of Substance 1: cocaine 1 - Amount (size/oz): .5 ounce a week 1 - Frequency: daily for past month 1 - Duration: ongoing 1 - Last Use / Amount: 11/14/20 1- Route of Use: inhaling and snorting                       ASAM's:  Six Dimensions of Multidimensional Assessment  Dimension 1:  Acute Intoxication and/or Withdrawal  Potential:      Dimension 2:  Biomedical Conditions and Complications:      Dimension 3:  Emotional, Behavioral, or Cognitive Conditions and Complications:     Dimension 4:  Readiness to Change:     Dimension 5:  Relapse, Continued use, or Continued Problem Potential:     Dimension 6:  Recovery/Living Environment:     ASAM Severity Score:    ASAM Recommended Level of Treatment: ASAM Recommended Level of Treatment: Level III Residential Treatment   Substance use Disorder (SUD)    Recommendations for Services/Supports/Treatments: Recommendations for Services/Supports/Treatments Recommendations For Services/Supports/Treatments: CD-IOP Intensive Chemical Dependency Program  DSM5 Diagnoses: Patient Active Problem List   Diagnosis Date Noted  . TIA (transient ischemic attack) 10/09/2020  . Leukocytosis 07/26/2019  . Nonischemic cardiomyopathy (HCC)   . Acute respiratory failure with hypoxia (HCC)  05/05/2019  . Rhinovirus infection 05/05/2019  . Morbid obesity (HCC) 05/05/2019  . Suspected COVID-19 virus infection 05/05/2019  . Acute intractable headache   . Cocaine abuse (HCC)   . Acute non intractable tension-type headache   . Blurred vision, bilateral   . Chronic systolic CHF (congestive heart failure) (HCC) 01/17/2019  . Pacemaker   . HLD (hyperlipidemia)   . Diabetes mellitus without complication (HCC)   . Hypertension   . GERD (gastroesophageal reflux disease)   . Nonspecific chest pain   . Tobacco abuse   . Alcohol abuse     Patient Centered Plan: Patient is on the following Treatment Plan(s):  Depression and Substance Abuse   Referrals to Alternative Service(s): Referred to Alternative Service(s):   Place:   Date:   Time:    Referred to Alternative Service(s):   Place:   Date:   Time:    Referred to Alternative Service(s):   Place:   Date:   Time:    Referred to Alternative Service(s):   Place:   Date:   Time:     Disposition: Per Luis Gum, MD, patient is recommended for overnight observation in ED   Audree Camel, [facility]

## 2020-11-15 NOTE — Progress Notes (Signed)
[facility] arrived to take [place] to [facility]. He left with the necessary paper work and his personal belongings.

## 2020-11-15 NOTE — ED Provider Notes (Signed)
[facility] EMERGENCY DEPARTMENT Provider Note   CSN: [id number] Arrival date & time: 11/15/20  1633     History No chief complaint on file.   Luis [name] is a 50 y.o. male.  50 year old male who presents from [facility] secondary to worsening suicidal ideations with a plan.  Patient has history of suicidal attempts in the past.  Apparently wears CPAP and has not allowed in that facility so was sent here for management they are recommended inpatient.  Patient states he also has history of CHF but at this time has no complaints of shortness of breath or any other associated symptoms.  States he just wants to get his mind right        Past Medical History:  Diagnosis Date  . Anxiety   . Biventricular ICD (implantable cardioverter-defibrillator) in place   . Chronic systolic CHF (congestive heart failure) (HCC)   . Cocaine use   . Diabetes mellitus without complication (HCC)   . GERD (gastroesophageal reflux disease)   . HLD (hyperlipidemia)   . Hypertension   . Morbid obesity (HCC)   . NICM (nonischemic cardiomyopathy) (HCC)   . OSA (obstructive sleep apnea)   . PTSD (post-traumatic stress disorder)    on Depakote  . Refusal of blood transfusions as patient is Jehovah's Witness   . Tobacco abuse     Patient Active Problem List   Diagnosis Date Noted  . TIA (transient ischemic attack) 10/09/2020  . Leukocytosis 07/26/2019  . Nonischemic cardiomyopathy (HCC)   . Acute respiratory failure with hypoxia (HCC) 05/05/2019  . Rhinovirus infection 05/05/2019  . Morbid obesity (HCC) 05/05/2019  . Suspected COVID-19 virus infection 05/05/2019  . Acute intractable headache   . Cocaine abuse (HCC)   . Acute non intractable tension-type headache   . Blurred vision, bilateral   . Chronic systolic CHF (congestive heart failure) (HCC) 01/17/2019  . Pacemaker   . HLD (hyperlipidemia)   . Diabetes mellitus without complication (HCC)   . Hypertension   . GERD  (gastroesophageal reflux disease)   . Nonspecific chest pain   . Tobacco abuse   . Alcohol abuse     Past Surgical History:  Procedure Laterality Date  . BIV ICD INSERTION CRT-D    . FOOT FRACTURE SURGERY    . ROTATOR CUFF REPAIR    . TONSILLECTOMY         Family History  Problem Relation Age of Onset  . Hypertension Mother   . Bone cancer Father   . Lung cancer Father     Social History   Tobacco Use  . Smoking status: Current Every Day Smoker    Types: Cigarettes  . Smokeless tobacco: Never Used  . Tobacco comment: occasionally  Vaping Use  . Vaping Use: Never used  Substance Use Topics  . Alcohol use: Not Currently  . Drug use: Yes    Types: Cocaine    Home Medications Prior to Admission medications   Medication Sig Start Date End Date Taking? Authorizing Provider  clopidogrel (PLAVIX) 75 MG tablet Take 75 mg by mouth daily. 01/13/19  Yes [provider]  divalproex (DEPAKOTE) 500 MG DR tablet Take 500 mg by mouth 2 (two) times a day. 06/30/18  Yes [provider]  furosemide (LASIX) 80 MG tablet Take 80 mg by mouth daily. 09/13/18  Yes [provider]  HUMALOG MIX 75/25 KWIKPEN (75-25) 100 UNIT/ML Kwikpen Inject 20 Units into the skin 2 (two) times daily before  a meal. Patient taking differently: Inject 60 Units into the skin 3 (three) times daily. 10/02/20  Yes Littie Deeds, MD  metoprolol succinate (TOPROL-XL) 100 MG 24 hr tablet Take 100 mg by mouth at bedtime. Take with or immediately following a meal.   Yes [provider]  Multiple Vitamin (MULTIVITAMIN WITH MINERALS) TABS tablet Take 1 tablet by mouth daily.   Yes [provider]  rosuvastatin (CRESTOR) 20 MG tablet Take 20 mg by mouth every evening. 09/21/18  Yes [provider]  sertraline (ZOLOFT) 50 MG tablet Take 150 mg by mouth daily. 10/28/18  Yes [provider]  traZODone (DESYREL) 100 MG tablet Take 200 mg by mouth at bedtime.  12/19/18  Yes  [provider]  VICTOZA 18 MG/3ML SOPN Inject 1.8 mg into the skin daily. 10/02/20 11/01/20 Yes Simmons-Robinson, Makiera, MD  isosorbide mononitrate (IMDUR) 30 MG 24 hr tablet Take 1 tablet (30 mg total) by mouth daily. 07/28/19   Black, Lesle Chris, NP  meclizine (ANTIVERT) 25 MG tablet Take 1 tablet (25 mg total) by mouth 3 (three) times daily as needed for dizziness. 06/26/20   Alvira Monday, MD  ondansetron (ZOFRAN ODT) 4 MG disintegrating tablet Take 1 tablet (4 mg total) by mouth every 8 (eight) hours as needed for nausea or vomiting. 06/26/20   Alvira Monday, MD  pantoprazole (PROTONIX) 40 MG tablet Take 1 tablet (40 mg total) by mouth 2 (two) times daily. Patient taking differently: Take 40 mg by mouth daily. 07/27/19   Joseph Art, DO  tamsulosin (FLOMAX) 0.4 MG CAPS capsule Take 1 capsule (0.4 mg total) by mouth daily after supper. 07/27/20   Gwyneth Sprout, MD    Allergies    Aspirin, Iodine-131, Iodinated diagnostic agents, Latex, Penicillins, Tramadol, Amoxicillin-pot clavulanate, Lidocaine, and Lisinopril  Review of Systems   Review of Systems  All other systems reviewed and are negative.   Physical Exam Updated Vital Signs BP 137/69   Pulse 79   Temp 98.6 F (37 C) (Oral)   Resp 16   Ht 5\' 5"  (1.651 m)   Wt 117.9 kg   SpO2 93%   BMI 43.27 kg/m   Physical Exam Vitals and nursing note reviewed.  Constitutional:      Appearance: He is well-developed.  HENT:     Head: Normocephalic and atraumatic.     Nose: No congestion or rhinorrhea.     Mouth/Throat:     Mouth: Mucous membranes are moist.     Pharynx: Oropharynx is clear.  Eyes:     Pupils: Pupils are equal, round, and reactive to light.  Cardiovascular:     Rate and Rhythm: Normal rate.  Pulmonary:     Effort: Pulmonary effort is normal. No respiratory distress.  Abdominal:     General: Abdomen is flat. There is no distension.  Musculoskeletal:        General: Normal range of motion.      Cervical back: Normal range of motion.  Skin:    General: Skin is warm and dry.  Neurological:     General: No focal deficit present.     Mental Status: He is alert.     ED Results / Procedures / Treatments   Labs (all labs ordered are listed, but only abnormal results are displayed) Labs Reviewed  COMPREHENSIVE METABOLIC PANEL - Abnormal; Notable for the following components:      Result Value   Glucose, Bld 321 (*)    Calcium 8.4 (*)  Total Protein 5.6 (*)    Albumin 3.2 (*)    All other components within normal limits  VALPROIC ACID LEVEL - Abnormal; Notable for the following components:   Valproic Acid Lvl 18 (*)    All other components within normal limits  RESP PANEL BY RT-PCR (FLU A&B, COVID) ARPGX2  ETHANOL  CBC WITH DIFFERENTIAL/PLATELET  RAPID URINE DRUG SCREEN, HOSP PERFORMED    EKG None  Radiology No results found.  Procedures Procedures   Medications Ordered in ED Medications - No data to display  ED Course  I have reviewed the triage vital signs and the nursing notes.  Pertinent labs & imaging results that were available during my care of the patient were reviewed by me and considered in my medical decision making (see chart for details).    MDM Rules/Calculators/A&P                          Patient will need inpatient psychiatry.  We will check the medical clearance labs however I do not see anything that would preclude his psychiatry treatment.  Medically cleared. Pending Med Rec to order home meds.   Final Clinical Impression(s) / ED Diagnoses Final diagnoses:  None    Rx / DC Orders ED Discharge Orders    None       Kiwanna Spraker, Barbara Cower, MD 11/15/20 2352

## 2020-11-15 NOTE — ED Notes (Signed)
Pt provided with maroon scrubs and instructed to change into them. 

## 2020-11-15 NOTE — ED Notes (Signed)
While obtaining EKG, pt asked if I was the one who took his blood. This Clinical research associate told pt that I was not, but it was probably his nurse Sophie. The pt stated, "That's right. You're a lot thicker than Sophie." Pt continued to say, "I remember you. You were the one that did my catheter the last time I was here." Pt then went on about a TV show in which a man infected 11 women with HIV and AIDs. Pt continued to make mildly inappropriate statements.

## 2020-11-16 DIAGNOSIS — R45851 Suicidal ideations: Secondary | ICD-10-CM

## 2020-11-16 DIAGNOSIS — F1424 Cocaine dependence with cocaine-induced mood disorder: Secondary | ICD-10-CM | POA: Diagnosis present

## 2020-11-16 LAB — CBG MONITORING, ED
Glucose-Capillary: 208 mg/dL — ABNORMAL HIGH (ref 70–99)
Glucose-Capillary: 102 mg/dL — ABNORMAL HIGH (ref 70–99)

## 2020-11-16 NOTE — ED Notes (Signed)
Pt resting at this time. Respirations even/unlabored. Pt appears in NAD at this time.

## 2020-11-16 NOTE — Progress Notes (Signed)
CSW contacted [employer] to inquire about the possible acceptance of the patient to treatment. CSW spoke with Morrie Sheldon who advised  Wyn Forster was attempting to confirm the patient's insurance, however was unable to at the time due to the birthday reported being 02-Dec-1970 instead of 10/02/1970. CSW assisted with updating Morrie Sheldon the date of birth for the patient. Morrie Sheldon advised that the patient would have to complete a phone assessment and updated information needed to be faxed to there facility/Fax#: [phone]. CSW have updated the patient's nurse.   Crissie Reese, MSW, LCSW-A, LCAS-A Phone: [phone] Disposition/TOC

## 2020-11-16 NOTE — ED Notes (Signed)
Patient resting at this time.

## 2020-11-16 NOTE — Consult Note (Addendum)
Telepsych Consultation   Reason for Consult:  Suicidal ideations=ns Referring Physician:  EDP Location of Patient:  [facility] Location of Provider: Other: Virtual home office  Patient Identification: Luis [name] MRN:  [id number] Principal Diagnosis: Cocaine dependence with cocaine-induced mood disorder (HCC) Diagnosis:  Principal Problem:   Cocaine dependence with cocaine-induced mood disorder (HCC) Active Problems:   Suicidal ideations  Total Time spent with patient: 30 minutes  Subjective:   Luis [name] is a 50 y.o. male patient admitted with cocaine induced mood.   Luis [name], 50 y.o., male patient presented to .  Patient seen via telepsych by this provider; chart reviewed and consulted with Dr. Lucianne Muss on 11/16/20.  On evaluation Luis [name] reports he is no longer suicidal and denies plan or intent. States he is more sad and upset with himself for his drug relapse.  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  Feels isolated from his family due to consequences of substance abuse, also notes other interpersonal familial concerns. Has has 7 children, acknowledges his tumultous relationship with them but does speak of possible plan for future reconciliation.     Restarted his meds last night, after being off for 4 months.  Regarding his relapse, states was clean for >18 years and relapse after Luis [name] died.  He denies side effects.   Some irritation noted early in assessment related to not receiving his CPCP machine for overnight use; diminishing irritability as the interview progresses and patient acknowledges his commitment to getting help at [facility] treatment facility.     HPI: Per EDP Admission Notes dated 11/15/2020:  History No chief complaint on file.   Luis [name] is a 50 y.o.  male.  50 year old male who presents from [facility] secondary to worsening suicidal ideations with a plan.  Patient has history of suicidal attempts in the past.  Apparently wears CPAP and has not allowed in that facility so was sent here for management they are recommended inpatient.  Patient states he also has history of CHF but at this time has no complaints of shortness of breath or any other associated symptoms.  States he just wants to get his mind right   Past Psychiatric History: Depression, Cocaine Dependence  Risk to Self:  no, Risk to Others:  no Prior Inpatient Therapy:   Prior Outpatient Therapy:    Past Medical History:  Past Medical History:  Diagnosis Date  . Anxiety   . Biventricular ICD (implantable cardioverter-defibrillator) in place   . Chronic systolic CHF (congestive heart failure) (HCC)   . Cocaine use   . Diabetes mellitus without complication (HCC)   . GERD (gastroesophageal reflux disease)   . HLD (hyperlipidemia)   . Hypertension   . Morbid obesity (HCC)   . NICM (nonischemic cardiomyopathy) (HCC)   . OSA (obstructive sleep apnea)   . PTSD (post-traumatic stress disorder)    on Depakote  . Refusal of blood transfusions as patient is Jehovah's Witness   . Tobacco abuse     Past Surgical History:  Procedure Laterality Date  . BIV ICD INSERTION CRT-D    . FOOT FRACTURE SURGERY    . ROTATOR CUFF REPAIR    . TONSILLECTOMY     Family History:  Family History  Problem Relation Age of Onset  . Hypertension Mother   . Bone cancer Father   .  Lung cancer Father    Family Psychiatric  History: unknown Social History:  Social History   Substance and Sexual Activity  Alcohol Use Not Currently     Social History   Substance and Sexual Activity  Drug Use Yes  . Types: Cocaine    Social History   Socioeconomic History  . Marital status: Legally Separated    Spouse name: Not on file  . Number of children: Not on file  . Years of education: Not on  file  . Highest education level: Not on file  Occupational History  . Not on file  Tobacco Use  . Smoking status: Current Every Day Smoker    Types: Cigarettes  . Smokeless tobacco: Never Used  . Tobacco comment: occasionally  Vaping Use  . Vaping Use: Never used  Substance and Sexual Activity  . Alcohol use: Not Currently  . Drug use: Yes    Types: Cocaine  . Sexual activity: Not on file  Other Topics Concern  . Not on file  Social History Narrative  . Not on file   Social Determinants of Health   Financial Resource Strain: Not on file  Food Insecurity: Not on file  Transportation Needs: Not on file  Physical Activity: Not on file  Stress: Not on file  Social Connections: Not on file   Additional Social History:    Allergies:   Allergies  Allergen Reactions  . Aspirin Anaphylaxis    Throat closing   . Iodine-131 Hives    Hives  . Iodinated Diagnostic Agents Hives  . Latex Hives and Rash  . Penicillins Nausea And Vomiting  . Tramadol Hives and Nausea Only  . Amoxicillin-Pot Clavulanate Diarrhea  . Lidocaine Rash  . Lisinopril Cough    Labs:  Results for orders placed or performed during the hospital encounter of 11/15/20 (from the past 48 hour(s))  Comprehensive metabolic panel     Status: Abnormal   Collection Time: 11/15/20  6:57 PM  Result Value Ref Range   Sodium 135 135 - 145 mmol/L   Potassium 4.0 3.5 - 5.1 mmol/L   Chloride 102 98 - 111 mmol/L   CO2 23 22 - 32 mmol/L   Glucose, Bld 321 (H) 70 - 99 mg/dL    Comment: Glucose reference range applies only to samples taken after fasting for at least 8 hours.   BUN 14 6 - 20 mg/dL   Creatinine, Ser 7.67 0.61 - 1.24 mg/dL   Calcium 8.4 (L) 8.9 - 10.3 mg/dL   Total Protein 5.6 (L) 6.5 - 8.1 g/dL   Albumin 3.2 (L) 3.5 - 5.0 g/dL   AST 17 15 - 41 U/L   ALT 17 0 - 44 U/L   Alkaline Phosphatase 48 38 - 126 U/L   Total Bilirubin 0.4 0.3 - 1.2 mg/dL   GFR, Estimated >34 >19 mL/min    Comment:  (NOTE) Calculated using the CKD-EPI Creatinine Equation (2021)    Anion gap 10 5 - 15    Comment: Performed at [facility], [address], [place], [place] [address]  Ethanol     Status: None   Collection Time: 11/15/20  6:57 PM  Result Value Ref Range   Alcohol, Ethyl (B) <10 <10 mg/dL    Comment: (NOTE) Lowest detectable limit for serum alcohol is 10 mg/dL.  For medical purposes only. Performed at [facility], [address], [place], [place] [address]   CBC with Diff  Status: None   Collection Time: 11/15/20  6:57 PM  Result Value Ref Range   WBC 8.2 4.0 - 10.5 K/uL   RBC 4.71 4.22 - 5.81 MIL/uL   Hemoglobin 14.0 13.0 - 17.0 g/dL   HCT 24.5 80.9 - 98.3 %   MCV 91.3 80.0 - 100.0 fL   MCH 29.7 26.0 - 34.0 pg   MCHC 32.6 30.0 - 36.0 g/dL   RDW 38.2 50.5 - 39.7 %   Platelets 166 150 - 400 K/uL   nRBC 0.0 0.0 - 0.2 %   Neutrophils Relative % 53 %   Neutro Abs 4.3 1.7 - 7.7 K/uL   Lymphocytes Relative 36 %   Lymphs Abs 3.0 0.7 - 4.0 K/uL   Monocytes Relative 9 %   Monocytes Absolute 0.8 0.1 - 1.0 K/uL   Eosinophils Relative 2 %   Eosinophils Absolute 0.2 0.0 - 0.5 K/uL   Basophils Relative 0 %   Basophils Absolute 0.0 0.0 - 0.1 K/uL   Immature Granulocytes 0 %   Abs Immature Granulocytes 0.02 0.00 - 0.07 K/uL    Comment: Performed at [facility], [address], [place], [place] [address]  Valproic acid level     Status: Abnormal   Collection Time: 11/15/20  6:57 PM  Result Value Ref Range   Valproic Acid Lvl 18 (L) 50.0 - 100.0 ug/mL    Comment: Performed at [facility], [address], [place], [place] [address]  Resp Panel by RT-PCR (Flu A&B, Covid) Nasopharyngeal Swab     Status: None   Collection Time: 11/15/20  7:30 PM   Specimen: Nasopharyngeal Swab; Nasopharyngeal(NP) swabs in vial transport medium  Result Value Ref Range   SARS Coronavirus 2 by RT PCR NEGATIVE NEGATIVE    Comment: (NOTE) SARS-CoV-2 target nucleic acids are NOT  DETECTED.  The SARS-CoV-2 RNA is generally detectable in upper respiratory specimens during the acute phase of infection. The lowest concentration of SARS-CoV-2 viral copies this assay can detect is 138 copies/mL. A negative result does not preclude SARS-Cov-2 infection and should not be used as the sole basis for treatment or other patient management decisions. A negative result may occur with  improper specimen collection/handling, submission of specimen other than nasopharyngeal swab, presence of viral mutation(s) within the areas targeted by this assay, and inadequate number of viral copies(<138 copies/mL). A negative result must be combined with clinical observations, patient history, and epidemiological information. The expected result is Negative.  Fact Sheet for Patients:  BloggerCourse.com  Fact Sheet for Healthcare Providers:  SeriousBroker.it  This test is no t yet approved or cleared by the [place] FDA and  has been authorized for detection and/or diagnosis of SARS-CoV-2 by FDA under an Emergency Use Authorization (EUA). This EUA will remain  in effect (meaning this test can be used) for the duration of the COVID-19 declaration under Section 564(b)(1) of the Act, 21 U.S.C.section 360bbb-3(b)(1), unless the authorization is terminated  or revoked sooner.       Influenza A by PCR NEGATIVE NEGATIVE   Influenza B by PCR NEGATIVE NEGATIVE    Comment: (NOTE) The Xpert Xpress SARS-CoV-2/FLU/RSV plus assay is intended as an aid in the diagnosis of influenza from Nasopharyngeal swab specimens and should not be used as a sole basis for treatment. Nasal washings and aspirates are unacceptable for Xpert Xpress SARS-CoV-2/FLU/RSV testing.  Fact Sheet for Patients: BloggerCourse.com  Fact Sheet for Healthcare Providers: SeriousBroker.it  This test is not yet  approved or  cleared by the [place] and has been authorized for detection and/or diagnosis of SARS-CoV-2 by FDA under an Emergency Use Authorization (EUA). This EUA will remain in effect (meaning this test can be used) for the duration of the COVID-19 declaration under Section 564(b)(1) of the Act, 21 U.S.C. section 360bbb-3(b)(1), unless the authorization is terminated or revoked.  Performed at [facility], [address], [place], [place] [address]   CBG monitoring, ED     Status: Abnormal   Collection Time: 11/16/20  9:42 AM  Result Value Ref Range   Glucose-Capillary 208 (H) 70 - 99 mg/dL    Comment: Glucose reference range applies only to samples taken after fasting for at least 8 hours.   Comment 1 Notify RN    Comment 2 Document in Chart     Medications:  Current Facility-Administered Medications  Medication Dose Route Frequency Provider Last Rate Last Admin  . clopidogrel (PLAVIX) tablet 75 mg  75 mg Oral Daily Mesner, Jason, MD   75 mg at 11/16/20 1208  . divalproex (DEPAKOTE) DR tablet 500 mg  500 mg Oral Q12H Mesner, Barbara Cower, MD   500 mg at 11/16/20 1209  . furosemide (LASIX) tablet 80 mg  80 mg Oral Daily Mesner, Jason, MD   80 mg at 11/16/20 1209  . insulin aspart protamine- aspart (NOVOLOG MIX 70/30) injection 60 Units  60 Units Subcutaneous TID WC Mesner, Barbara Cower, MD   60 Units at 11/16/20 0947  . liraglutide (VICTOZA) SOPN 1.8 mg  1.8 mg Subcutaneous Daily Mesner, Barbara Cower, MD   1.8 mg at 11/16/20 1212  . metoprolol succinate (TOPROL-XL) 24 hr tablet 100 mg  100 mg Oral Daily Mesner, Jason, MD   100 mg at 11/16/20 1210  . multivitamin with minerals tablet 1 tablet  1 tablet Oral Daily Mesner, Barbara Cower, MD   1 tablet at 11/16/20 1208  . rosuvastatin (CRESTOR) tablet 20 mg  20 mg Oral QPM Mesner, Barbara Cower, MD      . sertraline (ZOLOFT) tablet 150 mg  150 mg Oral Daily Mesner, Jason, MD   150 mg at 11/16/20 1208  . traZODone (DESYREL) tablet 200 mg  200 mg Oral QHS Mesner, Barbara Cower,  MD   200 mg at 11/16/20 0007   Current Outpatient Medications  Medication Sig Dispense Refill  . clopidogrel (PLAVIX) 75 MG tablet Take 75 mg by mouth daily.    . divalproex (DEPAKOTE) 500 MG DR tablet Take 500 mg by mouth 2 (two) times a day.    . furosemide (LASIX) 80 MG tablet Take 80 mg by mouth daily.    Marland Kitchen HUMALOG MIX 75/25 KWIKPEN (75-25) 100 UNIT/ML Kwikpen Inject 20 Units into the skin 2 (two) times daily before a meal. (Patient taking differently: Inject 60 Units into the skin 3 (three) times daily.) 15 mL 11  . metoprolol succinate (TOPROL-XL) 100 MG 24 hr tablet Take 100 mg by mouth at bedtime. Take with or immediately following a meal.    . Multiple Vitamin (MULTIVITAMIN WITH MINERALS) TABS tablet Take 1 tablet by mouth daily.    . rosuvastatin (CRESTOR) 20 MG tablet Take 20 mg by mouth every evening.    . sertraline (ZOLOFT) 50 MG tablet Take 150 mg by mouth daily.    . traZODone (DESYREL) 100 MG tablet Take 200 mg by mouth at bedtime.     Marland Kitchen VICTOZA 18 MG/3ML SOPN Inject 1.8 mg into the skin daily. 9 mL 0  Musculoskeletal: Limited assessment via telepsych visit.  Patient seen independently moving around in bed, no concerns.   Psychiatric Specialty Exam: Physical Exam HENT:     Head: Normocephalic.  Cardiovascular:     Rate and Rhythm: Normal rate.     Pulses: Normal pulses.  Pulmonary:     Effort: Pulmonary effort is normal.  Musculoskeletal:        General: Normal range of motion.     Cervical back: Normal range of motion.  Neurological:     General: No focal deficit present.     Mental Status: He is alert and oriented to person, place, and time.  Psychiatric:        Thought Content: Thought content normal.     Review of Systems  Constitutional: Negative.   HENT: Negative.   Eyes: Negative.   Respiratory: Negative.   Cardiovascular: Negative.   Gastrointestinal: Negative.   Endocrine: Negative.   Genitourinary: Negative.   Musculoskeletal: Negative for  myalgias.  Allergic/Immunologic: Negative.   Neurological: Negative.  Negative for tremors and seizures.  Hematological: Negative.   Psychiatric/Behavioral: Negative for agitation, behavioral problems, hallucinations, self-injury and suicidal ideas. The patient is not nervous/anxious and is not hyperactive.     Blood pressure 126/78, pulse 60, temperature (!) 97.1 F (36.2 C), temperature source Axillary, resp. rate 18, height 5\' 5"  (1.651 m), weight 117.9 kg, SpO2 96 %.Body mass index is 43.27 kg/m.  General Appearance: Casual and Fairly Groomed  Eye Contact:  Good  Speech:  Clear and Coherent and Normal Rate  Volume:  Normal  Mood:  Anxious  Affect:  Congruent  Thought Process:  Coherent and Goal Directed  Orientation:  Full (Time, Place, and Person)  Thought Content:  Logical and improved since admission and restarting psych meds  Suicidal Thoughts:  No, improved since admission, sleep and meds  Homicidal Thoughts:  No  Memory:  Immediate;   Good Recent;   Good Remote;   Good  Judgement:  Fair, appears baseline in the setting of drug use  Insight:  Fair, baseline in the setting of cocaine use  Psychomotor Activity:  Normal  Concentration:  Concentration: Fair and Attention Span: Fair  Recall:  Good  Fund of Knowledge:  Good  Language:  Good  Akathisia:  Negative  Handed:  Right  AIMS (if indicated):     Assets:  Communication Skills Desire for Improvement  ADL's:  Intact  Cognition:  WNL  Sleep:   >6 hours   Treatment Plan Summary: Plan- As per above assessment, there are no current grounds for involuntary commitment at this time.?  Patient is future oriented and relates his plan [facility].  He will be transported from the hospital to the facility. SW to help facilitate the transfer.   Patient is not currently interested in inpatient services, but expresses agreement to continue outpatient treatment - we have reviewed importance  of substance abuse abstinence, potential negative impact substance abuse can have on his relationships and level of functioning, and importance of medication compliance. ?  Disposition: No evidence of imminent risk to self or others at present.   Patient does not meet criteria for psychiatric inpatient admission. Supportive therapy provided about ongoing stressors. Discussed crisis plan, support from social network, calling 911, coming to the Emergency Department, and calling Suicide Hotline.   Spoke with Dr. Renaye Rakersrifan, EDP; Crissie ReeseJamaral Rease, SW informed of above recommendation and disposition.   This service was provided via telemedicine using  a 2-way, interactive audio and Immunologist.  Names of all persons participating in this telemedicine service and their role in this encounter. Name: Sherry Ruffing Role: Patient  Name: Ophelia Shoulder Role: PMHNP    Chales Abrahams, NP 11/16/2020 3:04 PM

## 2020-11-16 NOTE — ED Notes (Signed)
Notified [facility] that patient will be arriving tomorrow morning due to safe transport unable to transport patient today. Patient also made aware of situation and is agreeable to the plan.

## 2020-11-16 NOTE — Progress Notes (Signed)
RT went to check on pt about CPAP. Pt is already wearing his home CPAP and resting well.

## 2020-11-16 NOTE — Progress Notes (Signed)
CSW contacted [facility] to possible find a Rehab treatment for the patient due to suggested interest in attending treatment. Sandy with [facility] advised that patients can contact them Monday for to speak with admissions. CSW notes that currently [facility] in [place] and [place] have no bed availability. Resources for substance abuse listed below were added to the AVS:   [facility] [place]) [place], [place]  [phone]  [facility] - Admissions are currently completed Monday through Friday at 8am; both appointments and walk-ins are accepted.  Any individual that is a [facility] resident may present for a substance abuse screening and assessment for admission.  A person may be referred by numerous sources or self-refer.   Potential clients will be screened for medical necessity and appropriateness for the program.  Clients must meet criteria for high-intensity residential treatment services.  If clinically appropriate, a client will continue with the comprehensive clinical assessment and intake process, as well as enrollment in the [facility].  Address: [address] [place], [place] [address] Admin Hours: Mon-Fri 8AM to [facility] Hours: 24/7 Phone: [phone] Fax: [phone]  [facility] (Detox) Facility Based Crisis:  These are 3 locations for services: Please call before arrival   Address: [address] [place], [place] [address] Phone: [phone]  Address: [address] [place], [place] [address] Phone#: [phone]  Address: [address] [place], [place] [address] Phone#: [phone]   [facility] (offers outpatient therapy and intensive outpatient substance abuse therapy).  [address], [place], [place] [address] Phone: [phone]  [facility]: Offers FREE recovery skills classes, support groups, 1:1 Peer  Support, and Compeer Classes. [address], [place], [place] [address] Phone: [phone] (Call to complete intake).   [facility] [address] [place], [place] [address] Phone: [phone] ( Men Mission :ext: [phone] for Women Mission :ext: 5000  The [employer] provides food, shelter and other programs and services to the homeless men of [place] through our [employer].  By offering safe shelter, three meals a day, clean clothing, Biblical counseling, financial planning, vocational training, GED/education and employment assistance, we've helped mend the shattered lives of many homeless men since opening in 1974.  We have 388 beds available with an additional 88 mats for emergency situations.   Prospective Client Check-In Information Photo ID Required (State/ Out of State/ [facility]) - if photo ID is not available, clients are required to have a printout of a police/sheriff's criminal history report. Help out with chores around the Mission. No sex offender of any type (pending, charged, registered and/or any other sex related offenses) will be permitted to check in. Must be willing to abide by all rules, regulations, and policies established by the [employer]. The following will be provided - shelter, food, clothing, and biblical counseling.  Prospective Client Check-In Information . Must provide a photo I.D. within 30 days of your acceptance into the [facility] program. . Help out with chores around the Mission. . No sex offender of any type (pending, charged, registered and/or any other sex related offenses) will be permitted to check in. . Must be willing to abide by all rules, regulations, and policies established by the [employer]. . The following will be provided - shelter, food, clothing, and biblical counseling.  If you or someone you know is in need of assistance  at our [facility] shelter in [place], [place], please call  [phone] ext. [phone].  [facility]-will provide timely access to mental health services for children and adolescents (4-17) and adults presenting in a mental health crisis. The program is designed for those who need urgent Behavioral Health or Substance Use treatment and are not experiencing a medical crisis that would typically require an emergency room visit.    [address] [place], [place] [address] Phone: [phone] [email or web address]  [facility]: Phone#: [phone]  The [facility] SA Intensive Outpatient Program (SAIOP) means structured individual and group addiction activities and services that are provided at an outpatient program designed to assist adult and adolescent consumers to begin recovery and learn skills for recovery maintenance. The [facility] SAIOP program is offered at least 3 hours a day, 3 days a week.SAIOP services shall include a structured program consisting of, but not limited to, the following services: Individual counseling and support; Group counseling and support; Family counseling, training or support; Biochemical assays to identify recent drug use (e.g., urine drug screens); Strategies for relapse prevention to include community and social support systems in treatment; Life skills; Crisis contingency planning; Disease Management; and Treatment support activities that have been adapted or specifically designed for persons with physical disabilities, or persons with co-occurring disorders of mental illness and substance abuse/dependence or mental retardation/developmental disability and substance abuse/dependence. Phone: [phone]  Address:   The [facility] will also offer the following outpatient services: (Monday through Friday 8am-5pm)    Partial Hospitalization Program (PHP)  Substance Abuse Intensive Outpatient Program (SA-IOP)  Group Therapy  Medication  Management  Peer Living Room We also provide (24/7):  Assessments: Our mental health clinician and providers will conduct a focused mental health evaluation, assessing for immediate safety concerns and further mental health needs. Referral: Our team will provide resources and help connect to community based mental health treatment, when indicated, including psychotherapy, psychiatry, and other specialized behavioral health or substance use disorder services (for those not already in treatment). Transitional Care: Our team providers in person bridging and/or telephonic follow-up during the patient's transition to outpatient services.  The [facility] 24-Hour Call Center: [phone] Behavioral Health Crisis Line: [phone]  Crissie Reese, MSW, LCSW-A, [place] Phone: [phone] Disposition/TOC

## 2020-11-16 NOTE — ED Notes (Signed)
Pt belongings inventoried. Valuables inventoried and placed with security

## 2020-11-16 NOTE — ED Provider Notes (Signed)
Patient was reassessed by behavioral health and psychiatry team today.  He was felt to be reasonably safe and stable for discharge and outpatient management for his mental health concerns, per my discussion with Ophelia Shoulder NP.  He was provided resources for his substance abuse/detox centers to contact.  I personally reviewed his vital signs, she remained stable throughout his stay.  I also reviewed his medical work-up, which showed some very mild hyperglycemia, blood sugars to 208 this morning, with negative COVID and flu, otherwise unremarkable.  Patient was ordered and given his morning medications.   Terald Sleeper, MD 11/16/20 [phone]

## 2020-11-16 NOTE — ED Notes (Signed)
Pt eating breakfast at this time.  

## 2020-11-16 NOTE — Progress Notes (Signed)
Pt said he wear cpap at home and he got it in his bag. RT hooked his home cpap and pt wore it by himself and said he can do it by himself. Pt resting at this time.

## 2020-11-16 NOTE — Care Management (Signed)
Results for YUYA, VANWINGERDEN (MRN [id number]) as of 11/16/2020 14:28  Ref. Range 11/15/2020 18:57 11/15/2020 19:30  COMPREHENSIVE METABOLIC PANEL Unknown Rpt (A)   Sodium Latest Ref Range: 135 - 145 mmol/L 135   Potassium Latest Ref Range: 3.5 - 5.1 mmol/L 4.0   Chloride Latest Ref Range: 98 - 111 mmol/L 102   CO2 Latest Ref Range: 22 - 32 mmol/L 23   Glucose Latest Ref Range: 70 - 99 mg/dL 086 (H)   BUN Latest Ref Range: 6 - 20 mg/dL 14   Creatinine Latest Ref Range: 0.61 - 1.24 mg/dL 5.78   Calcium Latest Ref Range: 8.9 - 10.3 mg/dL 8.4 (L)   Anion gap Latest Ref Range: 5 - 15  10   Alkaline Phosphatase Latest Ref Range: 38 - 126 U/L 48   Albumin Latest Ref Range: 3.5 - 5.0 g/dL 3.2 (L)   AST Latest Ref Range: 15 - 41 U/L 17   ALT Latest Ref Range: 0 - 44 U/L 17   Total Protein Latest Ref Range: 6.5 - 8.1 g/dL 5.6 (L)   Total Bilirubin Latest Ref Range: 0.3 - 1.2 mg/dL 0.4   GFR, Estimated Latest Ref Range: >60 mL/min >60   WBC Latest Ref Range: 4.0 - 10.5 K/uL 8.2   RBC Latest Ref Range: 4.22 - 5.81 MIL/uL 4.71   Hemoglobin Latest Ref Range: 13.0 - 17.0 g/dL 46.9   HCT Latest Ref Range: 39.0 - 52.0 % 43.0   MCV Latest Ref Range: 80.0 - 100.0 fL 91.3   MCH Latest Ref Range: 26.0 - 34.0 pg 29.7   MCHC Latest Ref Range: 30.0 - 36.0 g/dL 62.9   RDW Latest Ref Range: 11.5 - 15.5 % 12.8   Platelets Latest Ref Range: 150 - 400 K/uL 166   nRBC Latest Ref Range: 0.0 - 0.2 % 0.0   Neutrophils Latest Units: % 53   Lymphocytes Latest Units: % 36   Monocytes Relative Latest Units: % 9   Eosinophil Latest Units: % 2   Basophil Latest Units: % 0   Immature Granulocytes Latest Units: % 0   NEUT# Latest Ref Range: 1.7 - 7.7 K/uL 4.3   Lymphocyte # Latest Ref Range: 0.7 - 4.0 K/uL 3.0   Monocyte # Latest Ref Range: 0.1 - 1.0 K/uL 0.8   Eosinophils Absolute Latest Ref Range: 0.0 - 0.5 K/uL 0.2   Basophils Absolute Latest Ref Range: 0.0 - 0.1 K/uL 0.0   Abs Immature Granulocytes Latest Ref  Range: 0.00 - 0.07 K/uL 0.02   Valproic Acid,S Latest Ref Range: 50.0 - 100.0 ug/mL 18 (L)   RESP PANEL BY RT-PCR (FLU A&B, COVID) ARPGX2 Unknown  Rpt  Influenza A By PCR Latest Ref Range: NEGATIVE   NEGATIVE  Influenza B By PCR Latest Ref Range: NEGATIVE   NEGATIVE  SARS Coronavirus 2 by RT PCR Latest Ref Range: NEGATIVE   NEGATIVE  Alcohol, Ethyl (B) Latest Ref Range: <10 mg/dL <52

## 2020-11-16 NOTE — ED Notes (Signed)
TTS in progress 

## 2020-11-16 NOTE — ED Notes (Signed)
Pt states he is not currently suicidal and states he wants to go home.

## 2020-11-16 NOTE — TOC Progression Note (Addendum)
Transition of Care [facility]) - Progression Note    Patient Details  Name: Luis [name] MRN: [id number] Date of Birth: 10-Aug-1970  Transition of Care [facility]) CM/SW Contact  Lockie Pares, RN Phone Number: 11/16/2020, 2:33 PM  Clinical Narrative:      Faxed to [facility], and last MD progress note. Awaiting official note of clearance to be entered into system from Psych services, then will send that as well to  Fax [phone] faxed psychiatric clearance to [facility]. Called the treatment center to ensure  that the faxed items were received.  [phone].  Need to call back in about 15 minutes to confirm      Expected Discharge Plan and Services    [facility] to [facility].                                              Social Determinants of Health (SDOH) Interventions    Readmission Risk Interventions No flowsheet data found.

## 2020-11-17 MED ORDER — ACETAMINOPHEN 500 MG PO TABS
1000.0000 mg | ORAL_TABLET | Freq: Once | ORAL | Status: AC
  Administered 2020-11-17: 1000 mg via ORAL
  Filled 2020-11-17: qty 2

## 2020-11-17 NOTE — ED Provider Notes (Signed)
10:13 AM Patient has been accepted to a facility in [place].  Patient is awake and alert, aware of transfer, has no new complaints.  He is amenable to transfer.  Dr. Samuel Jester accepts   Gerhard Munch, MD 11/17/20 1014

## 2020-11-17 NOTE — ED Notes (Addendum)
Pt transported to [facility] via [facility]. Pt discharged from ED. Pt belongings, valuables, CPAP machine, and medications sent with [facility].

## 2021-01-27 ENCOUNTER — Inpatient Hospital Stay (HOSPITAL_COMMUNITY)
Admission: EM | Admit: 2021-01-27 | Discharge: 2021-01-31 | DRG: 092 | Disposition: A | Discharge status: Skilled Nursing Facility | Payer: Medicare Other | Attending: Student in an Organized Health Care Education/Training Program | Admitting: Student in an Organized Health Care Education/Training Program

## 2021-01-27 ENCOUNTER — Other Ambulatory Visit: Payer: Self-pay

## 2021-01-27 ENCOUNTER — Emergency Department (HOSPITAL_COMMUNITY): Payer: Medicare Other

## 2021-01-27 ENCOUNTER — Encounter (HOSPITAL_COMMUNITY): Payer: Self-pay

## 2021-01-27 DIAGNOSIS — E119 Type 2 diabetes mellitus without complications: Secondary | ICD-10-CM | POA: Diagnosis present

## 2021-01-27 DIAGNOSIS — E1169 Type 2 diabetes mellitus with other specified complication: Secondary | ICD-10-CM

## 2021-01-27 DIAGNOSIS — Z20822 Contact with and (suspected) exposure to covid-19: Secondary | ICD-10-CM | POA: Diagnosis present

## 2021-01-27 DIAGNOSIS — R531 Weakness: Secondary | ICD-10-CM

## 2021-01-27 DIAGNOSIS — Z823 Family history of stroke: Secondary | ICD-10-CM

## 2021-01-27 DIAGNOSIS — Z801 Family history of malignant neoplasm of trachea, bronchus and lung: Secondary | ICD-10-CM

## 2021-01-27 DIAGNOSIS — R29818 Other symptoms and signs involving the nervous system: Principal | ICD-10-CM | POA: Diagnosis present

## 2021-01-27 DIAGNOSIS — Z794 Long term (current) use of insulin: Secondary | ICD-10-CM

## 2021-01-27 DIAGNOSIS — F1420 Cocaine dependence, uncomplicated: Secondary | ICD-10-CM | POA: Diagnosis present

## 2021-01-27 DIAGNOSIS — F17210 Nicotine dependence, cigarettes, uncomplicated: Secondary | ICD-10-CM | POA: Diagnosis present

## 2021-01-27 DIAGNOSIS — Z79899 Other long term (current) drug therapy: Secondary | ICD-10-CM

## 2021-01-27 DIAGNOSIS — Y848 Other medical procedures as the cause of abnormal reaction of the patient, or of later complication, without mention of misadventure at the time of the procedure: Secondary | ICD-10-CM | POA: Diagnosis not present

## 2021-01-27 DIAGNOSIS — G8311 Monoplegia of lower limb affecting right dominant side: Secondary | ICD-10-CM | POA: Diagnosis present

## 2021-01-27 DIAGNOSIS — Z7902 Long term (current) use of antithrombotics/antiplatelets: Secondary | ICD-10-CM

## 2021-01-27 DIAGNOSIS — Z8249 Family history of ischemic heart disease and other diseases of the circulatory system: Secondary | ICD-10-CM

## 2021-01-27 DIAGNOSIS — F1011 Alcohol abuse, in remission: Secondary | ICD-10-CM | POA: Diagnosis present

## 2021-01-27 DIAGNOSIS — Z95810 Presence of automatic (implantable) cardiac defibrillator: Secondary | ICD-10-CM

## 2021-01-27 DIAGNOSIS — I639 Cerebral infarction, unspecified: Secondary | ICD-10-CM

## 2021-01-27 DIAGNOSIS — Z8673 Personal history of transient ischemic attack (TIA), and cerebral infarction without residual deficits: Secondary | ICD-10-CM

## 2021-01-27 DIAGNOSIS — Z886 Allergy status to analgesic agent status: Secondary | ICD-10-CM

## 2021-01-27 DIAGNOSIS — F1410 Cocaine abuse, uncomplicated: Secondary | ICD-10-CM | POA: Diagnosis present

## 2021-01-27 DIAGNOSIS — E785 Hyperlipidemia, unspecified: Secondary | ICD-10-CM | POA: Diagnosis present

## 2021-01-27 DIAGNOSIS — I5022 Chronic systolic (congestive) heart failure: Secondary | ICD-10-CM | POA: Diagnosis present

## 2021-01-27 DIAGNOSIS — F4310 Post-traumatic stress disorder, unspecified: Secondary | ICD-10-CM | POA: Diagnosis present

## 2021-01-27 DIAGNOSIS — R519 Headache, unspecified: Secondary | ICD-10-CM | POA: Diagnosis present

## 2021-01-27 DIAGNOSIS — I428 Other cardiomyopathies: Secondary | ICD-10-CM | POA: Diagnosis present

## 2021-01-27 DIAGNOSIS — Z800 Family history of malignant neoplasm of digestive organs: Secondary | ICD-10-CM

## 2021-01-27 DIAGNOSIS — Z950 Presence of cardiac pacemaker: Secondary | ICD-10-CM | POA: Diagnosis present

## 2021-01-27 DIAGNOSIS — I10 Essential (primary) hypertension: Secondary | ICD-10-CM | POA: Diagnosis present

## 2021-01-27 DIAGNOSIS — K219 Gastro-esophageal reflux disease without esophagitis: Secondary | ICD-10-CM | POA: Diagnosis present

## 2021-01-27 DIAGNOSIS — I110 Hypertensive heart disease with heart failure: Secondary | ICD-10-CM | POA: Diagnosis present

## 2021-01-27 DIAGNOSIS — R29898 Other symptoms and signs involving the musculoskeletal system: Secondary | ICD-10-CM | POA: Diagnosis present

## 2021-01-27 DIAGNOSIS — F32A Depression, unspecified: Secondary | ICD-10-CM | POA: Diagnosis present

## 2021-01-27 DIAGNOSIS — T80818A Extravasation of other vesicant agent, initial encounter: Secondary | ICD-10-CM | POA: Diagnosis not present

## 2021-01-27 LAB — RAPID URINE DRUG SCREEN, HOSP PERFORMED
Amphetamines: NOT DETECTED
Barbiturates: NOT DETECTED
Benzodiazepines: NOT DETECTED
Cocaine: POSITIVE — AB
Opiates: NOT DETECTED
Tetrahydrocannabinol: NOT DETECTED

## 2021-01-27 LAB — CBC
HCT: 45.8 % (ref 39.0–52.0)
Hemoglobin: 14.7 g/dL (ref 13.0–17.0)
MCH: 29.6 pg (ref 26.0–34.0)
MCHC: 32.1 g/dL (ref 30.0–36.0)
MCV: 92.3 fL (ref 80.0–100.0)
Platelets: 190 10*3/uL (ref 150–400)
RBC: 4.96 MIL/uL (ref 4.22–5.81)
RDW: 13 % (ref 11.5–15.5)
WBC: 8.8 10*3/uL (ref 4.0–10.5)
nRBC: 0 % (ref 0.0–0.2)

## 2021-01-27 LAB — COMPREHENSIVE METABOLIC PANEL
ALT: 24 U/L (ref 0–44)
AST: 21 U/L (ref 15–41)
Albumin: 3.6 g/dL (ref 3.5–5.0)
Alkaline Phosphatase: 42 U/L (ref 38–126)
Anion gap: 9 (ref 5–15)
BUN: 8 mg/dL (ref 6–20)
CO2: 29 mmol/L (ref 22–32)
Calcium: 9.1 mg/dL (ref 8.9–10.3)
Chloride: 100 mmol/L (ref 98–111)
Creatinine, Ser: 0.91 mg/dL (ref 0.61–1.24)
GFR, Estimated: >60 mL/min (ref >60)
Glucose, Bld: 224 mg/dL — ABNORMAL HIGH (ref 70–99)
Potassium: 4.1 mmol/L (ref 3.5–5.1)
Sodium: 138 mmol/L (ref 135–145)
Total Bilirubin: 0.6 mg/dL (ref 0.3–1.2)
Total Protein: 6.4 g/dL — ABNORMAL LOW (ref 6.5–8.1)

## 2021-01-27 LAB — URINALYSIS, ROUTINE W REFLEX MICROSCOPIC
Bilirubin Urine: NEGATIVE
Glucose, UA: 50 mg/dL — AB
Hgb urine dipstick: NEGATIVE
Ketones, ur: NEGATIVE mg/dL
Leukocytes,Ua: NEGATIVE
Nitrite: NEGATIVE
Protein, ur: NEGATIVE mg/dL
Specific Gravity, Urine: 1.005 (ref 1.005–1.030)
pH: 6 (ref 5.0–8.0)

## 2021-01-27 LAB — APTT: aPTT: 27 seconds (ref 24–36)

## 2021-01-27 LAB — DIFFERENTIAL
Abs Immature Granulocytes: 0.04 10*3/uL (ref 0.00–0.07)
Basophils Absolute: 0 10*3/uL (ref 0.0–0.1)
Basophils Relative: 0 %
Eosinophils Absolute: 0.1 10*3/uL (ref 0.0–0.5)
Eosinophils Relative: 1 %
Immature Granulocytes: 1 %
Lymphocytes Relative: 26 %
Lymphs Abs: 2.3 10*3/uL (ref 0.7–4.0)
Monocytes Absolute: 0.6 10*3/uL (ref 0.1–1.0)
Monocytes Relative: 7 %
Neutro Abs: 5.8 10*3/uL (ref 1.7–7.7)
Neutrophils Relative %: 65 %

## 2021-01-27 LAB — I-STAT CHEM 8, ED
BUN: 8 mg/dL (ref 6–20)
Calcium, Ion: 1.21 mmol/L (ref 1.15–1.40)
Chloride: 101 mmol/L (ref 98–111)
Creatinine, Ser: 0.8 mg/dL (ref 0.61–1.24)
Glucose, Bld: 228 mg/dL — ABNORMAL HIGH (ref 70–99)
HCT: 45 % (ref 39.0–52.0)
Hemoglobin: 15.3 g/dL (ref 13.0–17.0)
Potassium: 4.1 mmol/L (ref 3.5–5.1)
Sodium: 140 mmol/L (ref 135–145)
TCO2: 23 mmol/L (ref 22–32)

## 2021-01-27 LAB — TROPONIN I (HIGH SENSITIVITY)
Troponin I (High Sensitivity): 13 ng/L (ref <18)
Troponin I (High Sensitivity): 13 ng/L (ref <18)

## 2021-01-27 LAB — RESP PANEL BY RT-PCR (FLU A&B, COVID) ARPGX2
Influenza A by PCR: NEGATIVE
Influenza B by PCR: NEGATIVE
SARS Coronavirus 2 by RT PCR: NEGATIVE

## 2021-01-27 LAB — PROTIME-INR
INR: 1 (ref 0.8–1.2)
Prothrombin Time: 12.6 seconds (ref 11.4–15.2)

## 2021-01-27 LAB — ETHANOL: Alcohol, Ethyl (B): <10 mg/dL (ref <10)

## 2021-01-27 MED ORDER — SERTRALINE HCL 50 MG PO TABS
150.0000 mg | ORAL_TABLET | Freq: Every day | ORAL | Status: DC
  Administered 2021-01-28 – 2021-01-31 (×4): 150 mg via ORAL
  Filled 2021-01-27 (×4): qty 1

## 2021-01-27 MED ORDER — ACETAMINOPHEN 650 MG RE SUPP: 650.0000 mg | Freq: Four times a day (QID) | RECTAL | Status: DC | PRN

## 2021-01-27 MED ORDER — ACETAMINOPHEN 325 MG PO TABS
650.0000 mg | ORAL_TABLET | Freq: Four times a day (QID) | ORAL | Status: DC | PRN
  Administered 2021-01-28 – 2021-01-29 (×4): 650 mg via ORAL
  Filled 2021-01-27 (×5): qty 2

## 2021-01-27 MED ORDER — INSULIN ASPART 100 UNIT/ML IJ SOLN: 0.0000 [IU] | Freq: Three times a day (TID) | INTRAMUSCULAR | Status: DC

## 2021-01-27 MED ORDER — ROSUVASTATIN CALCIUM 20 MG PO TABS
20.0000 mg | ORAL_TABLET | Freq: Every evening | ORAL | Status: DC
  Administered 2021-01-28 – 2021-01-30 (×3): 20 mg via ORAL
  Filled 2021-01-27 (×3): qty 1

## 2021-01-27 MED ORDER — ACETAMINOPHEN 500 MG PO TABS
1000.0000 mg | ORAL_TABLET | Freq: Once | ORAL | Status: AC
  Administered 2021-01-27: 1000 mg via ORAL
  Filled 2021-01-27: qty 2

## 2021-01-27 MED ORDER — INSULIN ASPART 100 UNIT/ML IJ SOLN
0.0000 [IU] | Freq: Three times a day (TID) | INTRAMUSCULAR | Status: DC
  Administered 2021-01-28: 8 [IU] via SUBCUTANEOUS
  Administered 2021-01-28: 5 [IU] via SUBCUTANEOUS
  Administered 2021-01-28: 11 [IU] via SUBCUTANEOUS
  Administered 2021-01-29: 8 [IU] via SUBCUTANEOUS
  Administered 2021-01-29 – 2021-01-30 (×3): 3 [IU] via SUBCUTANEOUS
  Administered 2021-01-30 (×2): 2 [IU] via SUBCUTANEOUS
  Administered 2021-01-31 (×2): 3 [IU] via SUBCUTANEOUS

## 2021-01-27 MED ORDER — DIVALPROEX SODIUM 250 MG PO DR TAB
500.0000 mg | DELAYED_RELEASE_TABLET | Freq: Two times a day (BID) | ORAL | Status: DC
  Administered 2021-01-28 – 2021-01-31 (×8): 500 mg via ORAL
  Filled 2021-01-27 (×8): qty 2

## 2021-01-27 MED ORDER — ENOXAPARIN SODIUM 40 MG/0.4ML IJ SOSY
40.0000 mg | PREFILLED_SYRINGE | Freq: Every day | INTRAMUSCULAR | Status: DC
  Administered 2021-01-28 – 2021-01-31 (×4): 40 mg via SUBCUTANEOUS
  Filled 2021-01-27 (×4): qty 0.4

## 2021-01-27 MED ORDER — CLOPIDOGREL BISULFATE 75 MG PO TABS
75.0000 mg | ORAL_TABLET | Freq: Every day | ORAL | Status: DC
  Administered 2021-01-28 – 2021-01-31 (×4): 75 mg via ORAL
  Filled 2021-01-27 (×4): qty 1

## 2021-01-27 MED ORDER — TRAZODONE HCL 100 MG PO TABS
200.0000 mg | ORAL_TABLET | Freq: Every day | ORAL | Status: DC
  Administered 2021-01-28 – 2021-01-30 (×4): 200 mg via ORAL
  Filled 2021-01-27: qty 4
  Filled 2021-01-27 (×3): qty 2

## 2021-01-27 MED ORDER — INSULIN DETEMIR 100 UNIT/ML ~~LOC~~ SOLN
20.0000 [IU] | Freq: Every day | SUBCUTANEOUS | Status: DC
  Administered 2021-01-28: 20 [IU] via SUBCUTANEOUS
  Filled 2021-01-27 (×2): qty 0.2

## 2021-01-27 NOTE — H&P (Addendum)
Date: 01/27/2021               Patient Name:  Luis [name] MRN: [id number]  DOB: 06-23-1971 Age / Sex: 50 y.o., male   PCP: Luis Ables, MD         Medical Service: Internal Medicine Teaching Service         Attending Physician: Dr. Oswaldo [name], Luis [name], *    First Contact: Dr. Allena [name] Pager: [phone]  Second Contact: Dr. Mcarthur [name] Pager: [phone]       After Hours (After 5p/  First Contact Pager: [phone]  weekends / holidays): Second Contact Pager: [phone]   Chief Complaint: R sided numbness and weakness  History of Present Illness: [name] is a 50 year old male with past medical history of CVA, DM, HTN, HLD, PTSD, CHF, pacemaker presents to the ED after three days of increasing R sided upper and lower extremity weakness and numbness. He began having trouble ambulating due to the numbness resulting in a few falls, one of which he hit his head, and on calling his PCP Dr. Val [name] at [facility] he was advised to be evaluated at the ED. He does feel that his right leg is more swollen as well. He endorses recent CVA in April 2022 which he received speech therapy for after as he experienced stutter, otherwise no deficits.  Denies syncope, chest pain, shortness of breath, weight changes, tingling or burning sensation in his extremities, GI upset. Endorses headache over L temple area, weakness of R UE and LE, urinary incontinence in the last week that has now resolved, blurred right vision, altered sensation to the right side of his body including his face, palpitations, short term memory loss, confusion, worsened forgetfulness, tinnitus unchanged from baseline.  Meds: Reports taking all home medications as prescribed.  No current facility-administered medications for this encounter.  Current Outpatient Medications:    clopidogrel (PLAVIX) 75 MG tablet, Take 75 mg by mouth daily., Disp: , Rfl:    divalproex (DEPAKOTE) 500 MG DR tablet, Take 500 mg by mouth 2 (two) times a  day., Disp: , Rfl:    furosemide (LASIX) 80 MG tablet, Take 80 mg by mouth daily., Disp: , Rfl:    HUMALOG MIX 75/25 KWIKPEN (75-25) 100 UNIT/ML Kwikpen, Inject 20 Units into the skin 2 (two) times daily before a meal. (Patient taking differently: Inject 60 Units into the skin 3 (three) times daily.), Disp: 15 mL, Rfl: 11   metoprolol succinate (TOPROL-XL) 100 MG 24 hr tablet, Take 100 mg by mouth at bedtime. Take with or immediately following a meal., Disp: , Rfl:    Multiple Vitamin (MULTIVITAMIN WITH MINERALS) TABS tablet, Take 1 tablet by mouth daily., Disp: , Rfl:    rosuvastatin (CRESTOR) 20 MG tablet, Take 20 mg by mouth every evening., Disp: , Rfl:    sertraline (ZOLOFT) 50 MG tablet, Take 150 mg by mouth daily., Disp: , Rfl:    traZODone (DESYREL) 100 MG tablet, Take 200 mg by mouth at bedtime. , Disp: , Rfl:    VICTOZA 18 MG/3ML SOPN, Inject 1.8 mg into the skin daily., Disp: 9 mL, Rfl: 0   Allergies: Allergies as of 01/27/2021 - Review Complete 01/27/2021  Allergen Reaction Noted   Aspirin Anaphylaxis 10/01/2016   Iodine-131 Hives 11/25/2016   Iodinated diagnostic agents Hives 10/01/2016   Latex Hives and Rash 03/29/2017   Penicillins Nausea And Vomiting 01/17/2019   Tramadol Hives and Nausea Only 09/27/2020   Amoxicillin-pot clavulanate Diarrhea  10/01/2016   Lidocaine Rash 09/30/2020   Lisinopril Cough 10/01/2016   Past Medical History:  Diagnosis Date   Anxiety    Biventricular ICD (implantable cardioverter-defibrillator) in place    Chronic systolic CHF (congestive heart failure) (HCC)    Cocaine use    Diabetes mellitus without complication (HCC)    GERD (gastroesophageal reflux disease)    HLD (hyperlipidemia)    Hypertension    Morbid obesity (HCC)    NICM (nonischemic cardiomyopathy) (HCC)    OSA (obstructive sleep apnea)    PTSD (post-traumatic stress disorder)    on Depakote   Refusal of blood transfusions as patient is Luis [name]    Tobacco abuse      Family History:  Father: CVA, bone cancer, liver cancer. Grandfather: MI at age 44.   Social History: Patient denies alcohol or drug use. Lives at home alone, has a male partner who lives out of state. Is able to care for himself and manage ADLs when he is not sick.  Review of Systems: A complete ROS was negative except as per HPI.   Physical Exam: Blood pressure (!) 141/66, pulse 76, temperature 98.7 F (37.1 C), temperature source Oral, resp. rate 18, SpO2 96 %. Physical Exam Vitals and nursing note reviewed.  Constitutional:      Appearance: He is obese.  HENT:     Head: Normocephalic and atraumatic.     Mouth/Throat:     Lips: Pink.     Mouth: Mucous membranes are moist.     Tongue: Tongue does not deviate from midline.     Pharynx: Oropharynx is clear. Uvula midline.  Eyes:     Extraocular Movements: Extraocular movements intact.  Neck:     Vascular: No JVD.  Cardiovascular:     Rate and Rhythm: Normal rate and regular rhythm.     Heart sounds: Normal heart sounds.  Pulmonary:     Effort: Pulmonary effort is normal.     Breath sounds: Normal air entry. No decreased breath sounds (difficult to auscultate due to body habitus).  Abdominal:     General: Bowel sounds are absent. There is no distension.     Palpations: Abdomen is soft.     Tenderness: There is no abdominal tenderness. There is no guarding.  Musculoskeletal:     Cervical back: Normal range of motion and neck supple.     Right lower leg: Edema present.     Left lower leg: Edema present.  Skin:    General: Skin is warm and dry.  Neurological:     Mental Status: He is alert and oriented to person, place, and time.     Cranial Nerves: Cranial nerves are intact. No facial asymmetry.     Sensory: No sensory deficit (reports decreased sensation over R UE and LE as well as R side of face).     Motor: Weakness (strength 4/5 R UE and LE, 5/5 L UE and LE) present. No tremor or abnormal muscle tone.   Psychiatric:        Attention and Perception: He is inattentive.        Speech: Speech is delayed.        Behavior: Behavior is cooperative.        Cognition and Memory: He exhibits impaired recent memory.     EKG: personally reviewed my interpretation is ventricular paced rhythm.  Assessment & Plan by Problem:  Mr. Emeline Gins is a 50 y.o. male with past medical history of CVA, diabetes,  heart failure, hypertension, PTSD, anxiety, depression, hyperlipidemia, pacemaker device, and substance abuse who presents with right sided numbness and weakness admitted for CVA work-up.  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 Patient endorses L sided headache at this time. Endorses blurry vision of the R eye which is new, decreased sensation of R UE and LE, decreased strength of R UE and LE, pain and swelling of RLE, and numnbess of R UE and LE all of which is new over the past 3 days however he did pull himself up to sitting for physical exam with the R arm. Patient states he takes all medications as prescribed and that they come pre-packaged in morning, afternoon, and evening doses by his pharmacy.  - Neuro checks every 4 hours - Tylenol 650 mg every 6 hours PRN pain, fever - Plavix 75 mg daily - MRI tomorrow--was not able to be completed at this time due to pacemaker. - CT angio head and neck  #Diabetes mellitus Patient states that recently his BG has been high and he has experienced increased urinary frequency over the last week as a result, at times causing urinary incontinence. He states that he takes all medications as prescribed. - novoLOG SS injection three times daily with meals - Levemir 20 units injection once daily - CBG monitoring every 6 hours  #Pacemaker device Patient states that device was recently interrogated.  Endorses fluttering feeling in his chest on examination though no irregular rhythm was appreciated. EKG on presentation showed ventricular paced rhythm, no evidence of acute infarct, troponin levels WNL.  #Congestive heart failure Patient denies worsening shortness of breath or increased weight.  - Hold home lasix avoid dropping BP too low as CVA is top of differential. - Most recent echo showed EF 30-35% (April 2022) - Repeat echo  #Hypertension Currently mildly hypertensive at 141/66.  - Allow permissive hypertension - Hold home lasix, metoprolol  #PTSD, anxiety, depression Patient endorses history of PTSD. Has history of ED visits for SI. Does not have psychiatric complaints at this time. - Depakote 500 mg twice daily - Trazodone 200 mg daily at bedtime - Sertraline 150 mg daily  #Hyperlipidemia History of HLD with admission lipid panel resulting triglycerides 155, HDL 39, otherwise WNL. - Rosuvastatin 20 mg daily.  #History of alcohol and cocaine abuse Patient denies consumption of alcohol or other drugs. UDS on presentation + for cocaine.  Dispo: Admit patient to Inpatient with expected length of stay greater than 2 midnights.  Signed: Champ Mungo, DO 01/27/2021, 11:25 PM  Pager: @MYPAGER @ After 5pm on weekdays and 1pm on weekends: On Call pager: [phone]

## 2021-01-27 NOTE — ED Triage Notes (Addendum)
Pt BIB [facility] from home for dizziness and leg pain x4 days, headache starting today. Pt has not taken his HTN meds today. PCP sent him here for further evaluation. Pt reports dizziness, left side headache and cold feeling/numbess to right hand and foot  BP 196/68 HR 90 98% RA CBG 231

## 2021-01-27 NOTE — ED Provider Notes (Signed)
Emergency Medicine Provider Triage Evaluation Note  Luis [name] , a 50 y.o. male  was evaluated in triage.  Pt complains of severe headache, blurry vision, right-sided weakness and numbness and tingling x4 days.  History of stroke.  Anticoagulated with aspirin and Plavix..  Review of Systems  Positive: Right-sided weakness, numbness, tingling, headache, blurry vision Negative: Chest pain, shortness of breath, nausea, vomiting  Physical Exam  BP (!) 150/76   Pulse 80   Temp 98.7 F (37.1 C) (Oral)   Resp 18   SpO2 95%  Gen:   Awake, no distress   Resp:  Normal effort  MSK:   Moves extremities without difficulty  Other:  Right-sided weakness and paresthesias on neurologic exam.  No pronator drift on the right, however he is unable to maintain his right arm in the air.  PERRL, EOMI.  Cranial nerves with deficit -no facial droop at rest, however right-sided facial weakness and asymmetry with motor movement.  Medical Decision Making  Medically screening exam initiated at 4:35 PM.  Appropriate orders placed.  Luis [name] was informed that the remainder of the evaluation will be completed by another provider, this initial triage assessment does not replace that evaluation, and the importance of remaining in the ED until their evaluation is complete.  Clinical concern for patient having experienced another stroke.  We will proceed with stroke work-up, however code stroke not activated as patient has had symptoms x4 days  This chart was dictated using voice recognition software, Dragon. Despite the best efforts of this provider to proofread and correct errors, errors may still occur which can change documentation meaning.    Sherrilee Gilles 01/27/21 1637    Ernie Avena, MD 01/27/21 2034

## 2021-01-27 NOTE — ED Provider Notes (Signed)
[facility] EMERGENCY DEPARTMENT Provider Note   CSN: [id number] Arrival date & time: 01/27/21  1618     History Chief Complaint  Patient presents with   Headache    Luis [name] is a 49 y.o. male.  HPI Patient with a history of prior stroke as well as multiple other medical issues including cardiomyopathy with pacer/defibrillator in place presents with 4 days of right-sided weakness, numbness, left-sided headache. No clear precipitant, patient notes that he has been compliant with his medication including aspirin and Plavix.  Patient's stroke was diagnosed earlier this year with MRI, results are reviewed consistent with possible basal ganglia stroke versus artifact. He notes that he had been recovering generally well, but now over the past 4 days has developed right-sided weakness upper and lower extremity, left-sided facial asymmetry and a left sided headache. No relief with anything.  Patient denies confusion, disorientation, does acknowledge some blurry vision, though inconsistently so.  He did have a fall, though he denies any pain anywhere.    Past Medical History:  Diagnosis Date   Anxiety    Biventricular ICD (implantable cardioverter-defibrillator) in place    Chronic systolic CHF (congestive heart failure) (HCC)    Cocaine use    Diabetes mellitus without complication (HCC)    GERD (gastroesophageal reflux disease)    HLD (hyperlipidemia)    Hypertension    Morbid obesity (HCC)    NICM (nonischemic cardiomyopathy) (HCC)    OSA (obstructive sleep apnea)    PTSD (post-traumatic stress disorder)    on Depakote   Refusal of blood transfusions as patient is Jehovah's Witness    Tobacco abuse     Patient Active Problem List   Diagnosis Date Noted   Cocaine dependence with cocaine-induced mood disorder (HCC) 11/16/2020   Suicidal ideations 11/16/2020   TIA (transient ischemic attack) 10/09/2020   Leukocytosis 07/26/2019   Nonischemic  cardiomyopathy (HCC)    Acute respiratory failure with hypoxia (HCC) 05/05/2019   Rhinovirus infection 05/05/2019   Morbid obesity (HCC) 05/05/2019   Suspected COVID-19 virus infection 05/05/2019   Acute intractable headache    Cocaine abuse (HCC)    Acute non intractable tension-type headache    Blurred vision, bilateral    Chronic systolic CHF (congestive heart failure) (HCC) 01/17/2019   Pacemaker    HLD (hyperlipidemia)    Diabetes mellitus without complication (HCC)    Hypertension    GERD (gastroesophageal reflux disease)    Nonspecific chest pain    Tobacco abuse    Alcohol abuse     Past Surgical History:  Procedure Laterality Date   BIV ICD INSERTION CRT-D     FOOT FRACTURE SURGERY     ROTATOR CUFF REPAIR     TONSILLECTOMY         Family History  Problem Relation Age of Onset   Hypertension Mother    Bone cancer Father    Lung cancer Father     Social History   Tobacco Use   Smoking status: Every Day    Types: Cigarettes   Smokeless tobacco: Never   Tobacco comments:    occasionally  Vaping Use   Vaping Use: Never used  Substance Use Topics   Alcohol use: Not Currently   Drug use: Yes    Types: Cocaine    Home Medications Prior to Admission medications   Medication Sig Start Date End Date Taking? Authorizing Provider  clopidogrel (PLAVIX) 75 MG tablet Take 75 mg by mouth daily. 01/13/19  [provider]  divalproex (DEPAKOTE) 500 MG DR tablet Take 500 mg by mouth 2 (two) times a day. 06/30/18   [provider]  furosemide (LASIX) 80 MG tablet Take 80 mg by mouth daily. 09/13/18   [provider]  HUMALOG MIX 75/25 KWIKPEN (75-25) 100 UNIT/ML Kwikpen Inject 20 Units into the skin 2 (two) times daily before a meal. Patient taking differently: Inject 60 Units into the skin 3 (three) times daily. 10/02/20   Littie Deeds, MD  metoprolol succinate (TOPROL-XL) 100 MG 24 hr tablet Take 100 mg by mouth at bedtime. Take with or  immediately following a meal.    [provider]  Multiple Vitamin (MULTIVITAMIN WITH MINERALS) TABS tablet Take 1 tablet by mouth daily.    [provider]  rosuvastatin (CRESTOR) 20 MG tablet Take 20 mg by mouth every evening. 09/21/18   [provider]  sertraline (ZOLOFT) 50 MG tablet Take 150 mg by mouth daily. 10/28/18   [provider]  traZODone (DESYREL) 100 MG tablet Take 200 mg by mouth at bedtime.  12/19/18   [provider]  VICTOZA 18 MG/3ML SOPN Inject 1.8 mg into the skin daily. 10/02/20 11/01/20  Simmons-Robinson, Tawanna Cooler, MD    Allergies    Aspirin, Iodine-131, Iodinated diagnostic agents, Latex, Penicillins, Tramadol, Amoxicillin-pot clavulanate, Lidocaine, and Lisinopril  Review of Systems   Review of Systems  Constitutional:        Per HPI, otherwise negative  HENT:         Per HPI, otherwise negative  Respiratory:         Per HPI, otherwise negative  Cardiovascular:        Per HPI, otherwise negative  Gastrointestinal:  Negative for vomiting.  Endocrine:       Negative aside from HPI  Genitourinary:        Neg aside from HPI   Musculoskeletal:        Per HPI, otherwise negative  Skin: Negative.   Neurological:  Positive for weakness and headaches. Negative for syncope and speech difficulty.   Physical Exam Updated Vital Signs BP (!) 141/66   Pulse 76   Temp 98.7 F (37.1 C) (Oral)   Resp 18   SpO2 96%   Physical Exam Vitals and nursing note reviewed.  Constitutional:      General: He is not in acute distress.    Appearance: He is well-developed.  HENT:     Head: Normocephalic and atraumatic.  Eyes:     Conjunctiva/sclera: Conjunctivae normal.  Cardiovascular:     Rate and Rhythm: Normal rate and regular rhythm.  Pulmonary:     Effort: Pulmonary effort is normal. No respiratory distress.     Breath sounds: No stridor.  Abdominal:     General: There is no distension.  Skin:    General: Skin is warm and  dry.  Neurological:     Mental Status: He is alert and oriented to person, place, and time.     Cranial Nerves: Facial asymmetry present.     Motor: Weakness present.     Comments: Patient has left facial droop.  Right upper extremity 4/5 strength with difficulty holding against gravity. Right distal lower extremity strength 4/5.  Right lower extremity reflexes diminished compared to left.    ED Results / Procedures / Treatments   Labs (all labs ordered are listed, but only abnormal results are displayed) Labs Reviewed  COMPREHENSIVE METABOLIC PANEL - Abnormal; Notable for the following components:  Result Value   Glucose, Bld 224 (*)    Total Protein 6.4 (*)    All other components within normal limits  RAPID URINE DRUG SCREEN, HOSP PERFORMED - Abnormal; Notable for the following components:   Cocaine POSITIVE (*)    All other components within normal limits  URINALYSIS, ROUTINE W REFLEX MICROSCOPIC - Abnormal; Notable for the following components:   Color, Urine STRAW (*)    Glucose, UA 50 (*)    All other components within normal limits  I-STAT CHEM 8, ED - Abnormal; Notable for the following components:   Glucose, Bld 228 (*)    All other components within normal limits  RESP PANEL BY RT-PCR (FLU A&B, COVID) ARPGX2  ETHANOL  PROTIME-INR  APTT  CBC  DIFFERENTIAL  TROPONIN I (HIGH SENSITIVITY)  TROPONIN I (HIGH SENSITIVITY)    EKG EKG Interpretation  Date/Time:  Monday January 27 2021 19:30:17 EDT Ventricular Rate:  68 PR Interval:  159 QRS Duration: 159 QT Interval:  444 QTC Calculation: 473 R Axis:   95 Text Interpretation: VENTRICULAR PACED RHYTHM Abnormal ECG Confirmed by Gerhard Munch [phone]) on 01/27/2021 10:13:48 PM  Radiology CT HEAD WO CONTRAST  Result Date: 01/27/2021 CLINICAL DATA:  Neuro deficit, acute, stroke suspected. Dizziness, headache EXAM: CT HEAD WITHOUT CONTRAST TECHNIQUE: Contiguous axial images were obtained from the base of the skull  through the vertex without intravenous contrast. COMPARISON:  10/09/2020 FINDINGS: Brain: No evidence of acute infarction, hemorrhage, hydrocephalus, extra-axial collection or mass lesion/mass effect. Vascular: No hyperdense vessel or unexpected calcification. Skull: Normal. Negative for fracture or focal lesion. Sinuses/Orbits: No acute finding. Other: None. IMPRESSION: No acute intracranial findings. Electronically Signed   By: Duanne Guess D.O.   On: 01/27/2021 18:50    Procedures Procedures   Medications Ordered in ED Medications  acetaminophen (TYLENOL) tablet 1,000 mg (1,000 mg Oral Given 01/27/21 1900)    ED Course  I have reviewed the triage vital signs and the nursing notes.  Pertinent labs & imaging results that were available during my care of the patient were reviewed by me and considered in my medical decision making (see chart for details).   11:32 PM Patient continues to complain of weakness.  I discussed this case with our neurologist who has seen and evaluated the patient in the emergency department.  MDM Rules/Calculators/A&P Adult male presents with days of right-sided neurodeficits, left-sided headache.  Given his history of stroke, differential including this as well as other etiology considered.  Patient's findings reassuring for infection, low suspicion for ACS with reassuring troponin, tox screen positive for cocaine which may be contributing somewhat, but given concern for new stroke patient required consultation with neurology, head CT, labs, and eventually admission for anticipated next day MRI, completion of stroke evaluation.  Efforts for stat MRI complicated by the patient's pacer/defibrillator, requiring additional procedures tomorrow.  MDM Number of Diagnoses or Management Options Weakness: new, needed workup   Amount and/or Complexity of Data Reviewed Clinical lab tests: ordered and reviewed Tests in the radiology section of CPT: ordered and  reviewed Tests in the medicine section of CPT: reviewed and ordered Decide to obtain previous medical records or to obtain history from someone other than the patient: yes Obtain history from someone other than the patient: yes Review and summarize past medical records: yes Discuss the patient with other providers: yes Independent visualization of images, tracings, or specimens: yes  Risk of Complications, Morbidity, and/or Mortality Presenting problems: high Diagnostic procedures: high Management options:  high  Critical Care Total time providing critical care: < 30 minutes  Patient Progress Patient progress: stable    Final Clinical Impression(s) / ED Diagnoses Final diagnoses:  Weakness      Gerhard Munch, MD 01/27/21 2334

## 2021-01-27 NOTE — Consult Note (Addendum)
Neurology Consultation Reason for Consult: Right-sided weakness Referring Physician: Jeraldine Loots, R  CC: Right-sided weakness and numbness  History is obtained from: Patient  HPI: Luis [name] is a 50 y.o. male with a history of CHF, cocaine use, diabetes, hypertension, hyperlipidemia who presents with right-sided numbness and weakness that started 4 days ago.  He states that it may have gotten slightly worse in the interim.  Due to the symptoms not improving, he sought care in the emergency department today where CT head is negative.  He has a history of a similar presentation with an MRI is showing a questionable area of diffusion positivity in the left basal ganglia.  He has an MRI safe defibrillator, but unfortunately this cannot be done at night.  He takes Depakote for PTSD.  LKW: 4 days ago tpa given?: no, outside of window   ROS: A 14 point ROS was performed and is negative except as noted in the HPI.  Past Medical History:  Diagnosis Date   Anxiety    Biventricular ICD (implantable cardioverter-defibrillator) in place    Chronic systolic CHF (congestive heart failure) (HCC)    Cocaine use    Diabetes mellitus without complication (HCC)    GERD (gastroesophageal reflux disease)    HLD (hyperlipidemia)    Hypertension    Morbid obesity (HCC)    NICM (nonischemic cardiomyopathy) (HCC)    OSA (obstructive sleep apnea)    PTSD (post-traumatic stress disorder)    on Depakote   Refusal of blood transfusions as patient is Jehovah's Witness    Tobacco abuse      Family History  Problem Relation Age of Onset   Hypertension Mother    Bone cancer Father    Lung cancer Father      Social History:  reports that he has been smoking cigarettes. He has never used smokeless tobacco. He reports previous alcohol use. He reports current drug use. Drug: Cocaine.   Exam: Current vital signs: BP (!) 155/69   Pulse 66   Temp 98.7 F (37.1 C) (Oral)   Resp 17   SpO2 94%   Vital signs in last 24 hours: Temp:  [98.7 F (37.1 C)] 98.7 F (37.1 C) (08/01 1613) Pulse Rate:  [66-80] 66 (08/01 2045) Resp:  [12-18] 17 (08/01 2045) BP: (133-155)/(65-76) 155/69 (08/01 2045) SpO2:  [94 %-97 %] 94 % (08/01 2045)   Physical Exam  Constitutional: Appears well-developed and well-nourished.  Psych: Affect appropriate to situation Eyes: No scleral injection HENT: No OP obstruction MSK: no joint deformities.  Cardiovascular: Normal rate and regular rhythm.  Respiratory: Effort normal, non-labored breathing GI: Soft.  No distension. There is no tenderness.  Skin: WDI  Neuro: Mental Status: Patient is awake, alert, oriented to person, place, month, year, and situation. Patient is able to give a clear and coherent history. No signs of aphasia or neglect Cranial Nerves: II: Visual Fields are full. Pupils are equal, round, and reactive to light.   III,IV, VI: EOMI without ptosis or diploplia.  V: Facial sensation is diminished on the right VII: Facial movement is symmetric.  VIII: hearing is intact to voice X: Uvula elevates symmetrically XI: Shoulder shrug is symmetric. XII: tongue is midline without atrophy or fasciculations.  Motor: Tone is normal. Bulk is normal. 5/5 strength was present on the left, he has 4/5 weakness of the right arm and leg.  Though his drift on the right is without pronation, he does have a positive orbital sign. Sensory: Sensation is diminished  on the right Cerebellar: Finger-nose-finger intact bilaterally     I have reviewed labs in epic and the results pertinent to this consultation are: UDS p current ositive for cocaine Creatinine 0.9  I have reviewed the images obtained: CT head-negative  Impression: 50 year old male with right-sided weakness and numbness of 4 days duration.  He is positive for cocaine as he seems to be each time this is checked.  There are some slight inconsistencies on exam, but I do think that he needs  to be further evaluated for possible stroke given his multitude of risk factors.  Recommendations: - HgbA1c, fasting lipid panel - MRI  of the brain without contrast - Frequent neuro checks - Echocardiogram -CTA head and neck - Prophylactic therapy-continue home ASA 81 mg - Risk factor modification - Telemetry monitoring - PT consult, OT consult, Speech consult - Stroke team to follow    Ritta Slot, MD [facility] [phone]  If 7pm- 7am, please page neurology on call as listed in AMION.

## 2021-01-28 ENCOUNTER — Observation Stay (HOSPITAL_COMMUNITY): Payer: Medicare Other

## 2021-01-28 ENCOUNTER — Encounter (HOSPITAL_COMMUNITY): Payer: Self-pay | Admitting: Student in an Organized Health Care Education/Training Program

## 2021-01-28 DIAGNOSIS — F32A Depression, unspecified: Secondary | ICD-10-CM | POA: Diagnosis present

## 2021-01-28 DIAGNOSIS — Z7902 Long term (current) use of antithrombotics/antiplatelets: Secondary | ICD-10-CM | POA: Diagnosis not present

## 2021-01-28 DIAGNOSIS — Z886 Allergy status to analgesic agent status: Secondary | ICD-10-CM | POA: Diagnosis not present

## 2021-01-28 DIAGNOSIS — R29898 Other symptoms and signs involving the musculoskeletal system: Secondary | ICD-10-CM | POA: Diagnosis not present

## 2021-01-28 DIAGNOSIS — I5022 Chronic systolic (congestive) heart failure: Secondary | ICD-10-CM

## 2021-01-28 DIAGNOSIS — R29818 Other symptoms and signs involving the nervous system: Secondary | ICD-10-CM | POA: Diagnosis present

## 2021-01-28 DIAGNOSIS — E119 Type 2 diabetes mellitus without complications: Secondary | ICD-10-CM | POA: Diagnosis present

## 2021-01-28 DIAGNOSIS — R609 Edema, unspecified: Secondary | ICD-10-CM | POA: Diagnosis not present

## 2021-01-28 DIAGNOSIS — Y848 Other medical procedures as the cause of abnormal reaction of the patient, or of later complication, without mention of misadventure at the time of the procedure: Secondary | ICD-10-CM | POA: Diagnosis not present

## 2021-01-28 DIAGNOSIS — Z823 Family history of stroke: Secondary | ICD-10-CM | POA: Diagnosis not present

## 2021-01-28 DIAGNOSIS — R531 Weakness: Secondary | ICD-10-CM | POA: Diagnosis present

## 2021-01-28 DIAGNOSIS — Z8673 Personal history of transient ischemic attack (TIA), and cerebral infarction without residual deficits: Secondary | ICD-10-CM | POA: Diagnosis not present

## 2021-01-28 DIAGNOSIS — Z8249 Family history of ischemic heart disease and other diseases of the circulatory system: Secondary | ICD-10-CM | POA: Diagnosis not present

## 2021-01-28 DIAGNOSIS — Z794 Long term (current) use of insulin: Secondary | ICD-10-CM | POA: Diagnosis not present

## 2021-01-28 DIAGNOSIS — F1011 Alcohol abuse, in remission: Secondary | ICD-10-CM | POA: Diagnosis present

## 2021-01-28 DIAGNOSIS — Z801 Family history of malignant neoplasm of trachea, bronchus and lung: Secondary | ICD-10-CM | POA: Diagnosis not present

## 2021-01-28 DIAGNOSIS — I428 Other cardiomyopathies: Secondary | ICD-10-CM | POA: Diagnosis present

## 2021-01-28 DIAGNOSIS — Z79899 Other long term (current) drug therapy: Secondary | ICD-10-CM | POA: Diagnosis not present

## 2021-01-28 DIAGNOSIS — Z20822 Contact with and (suspected) exposure to covid-19: Secondary | ICD-10-CM | POA: Diagnosis present

## 2021-01-28 DIAGNOSIS — E785 Hyperlipidemia, unspecified: Secondary | ICD-10-CM | POA: Diagnosis present

## 2021-01-28 DIAGNOSIS — T80818A Extravasation of other vesicant agent, initial encounter: Secondary | ICD-10-CM | POA: Diagnosis not present

## 2021-01-28 DIAGNOSIS — Z800 Family history of malignant neoplasm of digestive organs: Secondary | ICD-10-CM | POA: Diagnosis not present

## 2021-01-28 DIAGNOSIS — G8311 Monoplegia of lower limb affecting right dominant side: Secondary | ICD-10-CM | POA: Diagnosis present

## 2021-01-28 DIAGNOSIS — Z95810 Presence of automatic (implantable) cardiac defibrillator: Secondary | ICD-10-CM | POA: Diagnosis not present

## 2021-01-28 DIAGNOSIS — I10 Essential (primary) hypertension: Secondary | ICD-10-CM

## 2021-01-28 DIAGNOSIS — I110 Hypertensive heart disease with heart failure: Secondary | ICD-10-CM | POA: Diagnosis present

## 2021-01-28 DIAGNOSIS — F1410 Cocaine abuse, uncomplicated: Secondary | ICD-10-CM | POA: Diagnosis present

## 2021-01-28 DIAGNOSIS — F4310 Post-traumatic stress disorder, unspecified: Secondary | ICD-10-CM | POA: Diagnosis present

## 2021-01-28 LAB — ECHOCARDIOGRAM COMPLETE
Area-P 1/2: 3.51 cm2
Calc EF: 33.7 %
S' Lateral: 5 cm
Single Plane A2C EF: 31.6 %
Single Plane A4C EF: 39 %

## 2021-01-28 LAB — CBC
HCT: 45.3 % (ref 39.0–52.0)
HCT: 40.9 % (ref 39.0–52.0)
Hemoglobin: 14.3 g/dL (ref 13.0–17.0)
Hemoglobin: 13.3 g/dL (ref 13.0–17.0)
MCH: 29.6 pg (ref 26.0–34.0)
MCH: 29.6 pg (ref 26.0–34.0)
MCHC: 31.6 g/dL (ref 30.0–36.0)
MCHC: 32.5 g/dL (ref 30.0–36.0)
MCV: 93.8 fL (ref 80.0–100.0)
MCV: 91.1 fL (ref 80.0–100.0)
Platelets: 193 10*3/uL (ref 150–400)
Platelets: 226 10*3/uL (ref 150–400)
RBC: 4.83 MIL/uL (ref 4.22–5.81)
RBC: 4.49 MIL/uL (ref 4.22–5.81)
RDW: 13 % (ref 11.5–15.5)
RDW: 13 % (ref 11.5–15.5)
WBC: 9.7 10*3/uL (ref 4.0–10.5)
WBC: 7.6 10*3/uL (ref 4.0–10.5)
nRBC: 0 % (ref 0.0–0.2)
nRBC: 0 % (ref 0.0–0.2)

## 2021-01-28 LAB — HEMOGLOBIN A1C
Hgb A1c MFr Bld: 8.8 % — ABNORMAL HIGH (ref 4.8–5.6)
Mean Plasma Glucose: 205.86 mg/dL

## 2021-01-28 LAB — CBG MONITORING, ED
Glucose-Capillary: 249 mg/dL — ABNORMAL HIGH (ref 70–99)
Glucose-Capillary: 346 mg/dL — ABNORMAL HIGH (ref 70–99)
Glucose-Capillary: 263 mg/dL — ABNORMAL HIGH (ref 70–99)

## 2021-01-28 LAB — GLUCOSE, CAPILLARY
Glucose-Capillary: 289 mg/dL — ABNORMAL HIGH (ref 70–99)
Glucose-Capillary: 278 mg/dL — ABNORMAL HIGH (ref 70–99)

## 2021-01-28 LAB — COMPREHENSIVE METABOLIC PANEL
ALT: 16 U/L (ref 0–44)
AST: 30 U/L (ref 15–41)
Albumin: 3 g/dL — ABNORMAL LOW (ref 3.5–5.0)
Alkaline Phosphatase: 36 U/L — ABNORMAL LOW (ref 38–126)
Anion gap: 7 (ref 5–15)
BUN: 9 mg/dL (ref 6–20)
CO2: 25 mmol/L (ref 22–32)
Calcium: 8.4 mg/dL — ABNORMAL LOW (ref 8.9–10.3)
Chloride: 103 mmol/L (ref 98–111)
Creatinine, Ser: 0.78 mg/dL (ref 0.61–1.24)
GFR, Estimated: >60 mL/min (ref >60)
Glucose, Bld: 249 mg/dL — ABNORMAL HIGH (ref 70–99)
Potassium: 4.6 mmol/L (ref 3.5–5.1)
Sodium: 135 mmol/L (ref 135–145)
Total Bilirubin: 1 mg/dL (ref 0.3–1.2)
Total Protein: 5.1 g/dL — ABNORMAL LOW (ref 6.5–8.1)

## 2021-01-28 LAB — LIPID PANEL
Cholesterol: 141 mg/dL (ref 0–200)
HDL: 39 mg/dL — ABNORMAL LOW (ref >40)
LDL Cholesterol: 71 mg/dL (ref 0–99)
Total CHOL/HDL Ratio: 3.6 RATIO
Triglycerides: 155 mg/dL — ABNORMAL HIGH (ref <150)
VLDL: 31 mg/dL (ref 0–40)

## 2021-01-28 LAB — CREATININE, SERUM
Creatinine, Ser: 0.77 mg/dL (ref 0.61–1.24)
GFR, Estimated: >60 mL/min (ref >60)

## 2021-01-28 MED ORDER — PANTOPRAZOLE SODIUM 40 MG PO TBEC
40.0000 mg | DELAYED_RELEASE_TABLET | Freq: Every day | ORAL | Status: DC
  Administered 2021-01-28 – 2021-01-31 (×4): 40 mg via ORAL
  Filled 2021-01-28 (×4): qty 1

## 2021-01-28 MED ORDER — DIPHENHYDRAMINE HCL 50 MG/ML IJ SOLN
50.0000 mg | Freq: Once | INTRAMUSCULAR | Status: AC
  Administered 2021-01-28: 50 mg via INTRAVENOUS
  Filled 2021-01-28: qty 1

## 2021-01-28 MED ORDER — INSULIN ASPART 100 UNIT/ML IJ SOLN
10.0000 [IU] | Freq: Three times a day (TID) | INTRAMUSCULAR | Status: DC
  Administered 2021-01-28 – 2021-01-31 (×9): 10 [IU] via SUBCUTANEOUS

## 2021-01-28 MED ORDER — BUTALBITAL-APAP-CAFFEINE 50-325-40 MG PO TABS
1.0000 | ORAL_TABLET | Freq: Once | ORAL | Status: AC
  Administered 2021-01-28: 1 via ORAL
  Filled 2021-01-28: qty 1

## 2021-01-28 MED ORDER — METHYLPREDNISOLONE SODIUM SUCC 40 MG IJ SOLR
40.0000 mg | Freq: Once | INTRAMUSCULAR | Status: AC
  Administered 2021-01-28: 40 mg via INTRAVENOUS
  Filled 2021-01-28: qty 1

## 2021-01-28 MED ORDER — PRAZOSIN HCL 2 MG PO CAPS: 2.0000 mg | ORAL_CAPSULE | Freq: Every day | ORAL | Status: DC

## 2021-01-28 MED ORDER — INSULIN DETEMIR 100 UNIT/ML ~~LOC~~ SOLN
20.0000 [IU] | Freq: Two times a day (BID) | SUBCUTANEOUS | Status: DC
  Administered 2021-01-28 – 2021-01-31 (×6): 20 [IU] via SUBCUTANEOUS
  Filled 2021-01-28 (×7): qty 0.2

## 2021-01-28 MED ORDER — LORAZEPAM 2 MG/ML IJ SOLN
1.0000 mg | Freq: Once | INTRAMUSCULAR | Status: AC | PRN
  Administered 2021-01-29: 1 mg via INTRAVENOUS
  Filled 2021-01-28: qty 1

## 2021-01-28 NOTE — Progress Notes (Addendum)
STROKE TEAM PROGRESS NOTE    Interval History   No acute events overnight, patient is resting in the hallway on stretcher. He states that he continues to have numbness to the right arm and right leg.   Neurological exam pertinent for give way weakness and suggestion of right hemiparesis.  Review of his electronic medical record show that he had a previous admission in April for similar symptoms and MRI had shown a questionable left basal ganglia weakly diffusion hyperintensity likely an artifact and opinion rather than a true stroke.   Pertinent Lab Work and Imaging    01/27/21 CT Head WO IV Contrast NAICP   01/28/21 CT Head WO Contrast  NAICP   01/28/21 Echocardiogram   1. No LV thrombus is seen. There is marked septal-lateral wall left ventricular dyssynchrony. Left ventricular ejection fraction, by estimation, is 30 to 35%. The left ventricle has moderately decreased function. The left ventricle demonstrates global hypokinesis. Left ventricular diastolic parameters are consistent with Grade II diastolic dysfunction (pseudonormalization). Elevated left atrial pressure.   2. Right ventricular systolic function is normal. The right ventricular size is normal. Tricuspid regurgitation signal is inadequate for assessing PA pressure.   3. Left atrial size was moderately dilated.   4. The mitral valve is normal in structure. Mild mitral valve regurgitation. No evidence of mitral stenosis.   5. The aortic valve is normal in structure. Aortic valve regurgitation is not visualized. No aortic stenosis is present.   6. The inferior vena cava is dilated in size with <50% respiratory variability, suggesting right atrial pressure of 15 mmHg.  Pending MRI Brain, MRA Head and Neck   Physical Examination   Constitutional: Resting in bed in NAD  Cardiovascular: Normal RR Respiratory: No increased WOB   Mental status: AAOx4 Speech: Fluent, repetition and naming intact  Cranial nerves: EOMI, VFF, Face  symmetric, Tongue midline, Shoulder shrug intact  Motor: Normal bulk and tone. Drifts to RUE and RLE. + Hoover test.  Is positive.  He has notable giveway weakness when assess strength to the right arm and right leg.  Sensory: States that vibration to face is decreased to the right. Sensation to vibration reduced to the right leg, right arm..  He splits vibration sensation over forehead. Coordination: FNF intact  Gait: Deferred  Assessment and Plan   [name] is a 50 y.o. male w/pmh of CHF, cocaine use, diabetes, hypertension, hyperlipidemia who presents with right-sided numbness and weakness. He was outside the window for IVTPA and thrombectomy.   #Right Sided Weakness Non organic pattern Patient presented with the symptoms described above. At this time, his stroke work up is ongoing. He has had two CTH done on 8/1 and 8/2 both are negative. Vessel imaging is pending with MRA Head and Neck along with MRI Brain. Echo w/EF 30 to 35 %, left atrium moderately dilated. Stroke labs were completed including Lipid panel w/LDL 71 and Hemoglobin A1C 8.8. UDS positive for cocaine. His neurological examination is pertinent for functional findings; give way weakness, positive Hoover test and vibration decreased to the frontal bone on the right side. Given stroke risk factors of diabetes, hypertension, hyperlipidemia will proceed with stroke work up to rule out a stroke. - Continue Plavix 75 mg QD and Crestor 20 mg for stroke prevention  -At discharge please place ambulatory referral to neurology for stroke follow up   #Hypertension #HF  He has a history of HTN, HF and takes Furosemide, Toprolol, Victoza at home. Currently blood  pressure is trending 110-150. No need for permissive HTN given sx started 5 days ago.   #Hyperlipidemia From a stroke prevention stand point, the LDL goal is < 70. LDL is 71, close to goal. Continue Crestor.   #DMII  Hemoglobin A1C this admission noted to be 8.8,  not at goal from a stroke standpoint. Recommend SSI while hospitalized.   Hospital day # 0  Luis Jock, NP  [facility] Nurse Practitioner Patient seen and discussed with attending physician Dr. Pearlean Brownie   Stroke MD Note: I have personally obtained history,examined this patient, reviewed notes, independently viewed imaging studies, participated in medical decision making and plan of care.ROS completed by me personally and pertinent positives fully documented  I have made any additions or clarifications directly to the above note. Agree with note above.  Patient has presented with subjective right-sided weakness with nonorganic pattern with giveaway weakness and poor effort.  CT scan is unremarkable.  He had similar presentation in April with essentially negative work-up.  Recommend MRI but since he has a pacemaker this will need [employer] rep to coordinate it.  Continue ongoing work-up.  Patient encouraged to to quit cocaine abuse which could be contributing.  Discussed with primary team.  Greater than 50% time during this 35-minute visit was spent on counseling and coordination of care and discussion with care team. Delia Heady, MD Medical Director [facility] Pager: [phone] 01/28/2021 10:13 PM   To contact Stroke Continuity provider, please refer to [email or web address]. After hours, contact General Neurology

## 2021-01-28 NOTE — Progress Notes (Addendum)
Subjective: ON: NAE  This am: Patient lying in bed. Reports that he is continuing to have a left sided headache and right sided upper and lower extremity weakness. He also endorses left sided blurry vision along with intractable headache.   Objective:  Physical exam: General: lying in bed, in no acute distress CV: RRR no murmurs  Lungs: Normal work of breathing  Extremities: Lower extremities equal in size with out swelling or erythema. Right leg not hot to touch.  Neuro: Alert and oriented. Decreased strength and sensation in right upper and lower extremities.  Psych: Normal, non-pressured speech.   Vital signs in last 24 hours: Vitals:   01/28/21 0724 01/28/21 0726 01/28/21 0821 01/28/21 1146  BP:  115/60 (!) 158/91 (!) 142/73  Pulse:  67 70 82  Resp:  18 18 17   Temp: 97.8 F (36.6 C)     TempSrc: Oral     SpO2:  94% 96% 94%   Weight change:  No intake or output data in the 24 hours ending 01/28/21 1257  CBC Latest Ref Rng & Units 01/28/2021 01/27/2021 01/27/2021  WBC 4.0 - 10.5 K/uL 7.6 9.7 -  Hemoglobin 13.0 - 17.0 g/dL 03/29/2021 02.7 25.3  Hematocrit 39.0 - 52.0 % 40.9 45.3 45.0  Platelets 150 - 400 K/uL 226 193 -   CMP Latest Ref Rng & Units 01/28/2021 01/27/2021 01/27/2021  Glucose 70 - 99 mg/dL 03/29/2021) - 403(K)  BUN 6 - 20 mg/dL 9 - 8  Creatinine 742(V - 1.24 mg/dL 9.56 3.87 5.64  Sodium 135 - 145 mmol/L 135 - 140  Potassium 3.5 - 5.1 mmol/L 4.6 - 4.1  Chloride 98 - 111 mmol/L 103 - 101  CO2 22 - 32 mmol/L 25 - -  Calcium 8.9 - 10.3 mg/dL 3.32) - -  Total Protein 6.5 - 8.1 g/dL 5.1(L) - -  Total Bilirubin 0.3 - 1.2 mg/dL 1.0 - -  Alkaline Phos 38 - 126 U/L 36(L) - -  AST 15 - 41 U/L 30 - -  ALT 0 - 44 U/L 16 - -   Lipid Panel     Component Value Date/Time   CHOL 141 01/27/2021 2009   TRIG 155 (H) 01/27/2021 2009   HDL 39 (L) 01/27/2021 2009   CHOLHDL 3.6 01/27/2021 2009   VLDL 31 01/27/2021 2009   LDLCALC 71 01/27/2021 2009   Component     Latest Ref Rng & Units  01/18/2019 07/26/2019 09/30/2020 01/27/2021  Hemoglobin A1C     4.8 - 5.6 % 11.2 (H) 9.4 (H) 10.2 (H) 8.8 (H)  Mean Plasma Glucose     mg/dL 03/29/2021 884.16 606.30 160.10   Urine dipstick shows positive for glucose.  Micro exam: not done.  Drugs of Abuse     Component Value Date/Time   LABOPIA NONE DETECTED 01/27/2021 1822   COCAINSCRNUR POSITIVE (A) 01/27/2021 1822   LABBENZ NONE DETECTED 01/27/2021 1822   AMPHETMU NONE DETECTED 01/27/2021 1822   THCU NONE DETECTED 01/27/2021 1822   LABBARB NONE DETECTED 01/27/2021 1822   Micro: Resp panel: negative  Imaging: CT Head w/o co (8/1): Brain: No evidence of acute infarction, hemorrhage, hydrocephalus, extra-axial collection or mass lesion/mass effect. Vascular: No hyperdense vessel or unexpected calcification. Skull: Normal. Negative for fracture or focal lesion. Sinuses/Orbits: No acute finding. Other: None. IMPRESSION: No acute intracranial findings.  Echocardiogram (8/2): IMPRESSIONS   1. No LV thrombus is seen. There is marked septal-lateral wall left ventricular dyssynchrony. Left ventricular ejection fraction, by estimation,  is 30 to 35%. The left ventricle has moderately decreased function. The left ventricle demonstrates global hypokinesis. Left ventricular diastolic parameters are consistent with Grade II diastolic dysfunction (pseudonormalization). Elevated left atrial  pressure.   2. Right ventricular systolic function is normal. The right ventricular size is normal. Tricuspid regurgitation signal is inadequate for assessing PA pressure.   3. Left atrial size was moderately dilated.   4. The mitral valve is normal in structure. Mild mitral valve regurgitation. No evidence of mitral stenosis.   5. The aortic valve is normal in structure. Aortic valve regurgitation is not visualized. No aortic stenosis is present.   6. The inferior vena cava is dilated in size with <50% respiratory variability, suggesting right atrial pressure of  15 mmHg.   Comparison(s): No significant change from prior study. Prior images  reviewed side by side.   CTA head and neck: pending MRI brain: pending   Assessment/Plan:  Principal Problem:   Right arm weakness Active Problems:   Chronic systolic CHF (congestive heart failure) (HCC)   Pacemaker   Diabetes mellitus without complication (HCC)   Hypertension   Cocaine use disorder (HCC)  Possible cerebral infarction needing r/o Right sided motor and sensory deficit concerning for L. sided CVA Biventricular pacemaker  Pt with phx of MRI in 04/22 that showed acute/early subacute punctate infarct in the Lt basal ganglia vs. Artifact. Neurology at the time thought that the image was consistent with artifact. During this hospitalization, pt here with 3 day hx of non-improving r. UE and LE weakness. He also has decreased sensation in R UE and LE with an intractable headache and change in vision. Pt endorses no n/v. Neuro exam consistent with hx, right UE and LE 4/5 strength. On labs pt with unremarkable CBC and CMP, glucose slightly elevated above 200. On lipid panel, LDL WNL at 71. UDS positive for cocaine on admission. On imaging with CT head w/o co on 8/1 no acute intracranial changes were noted. Echo on 8/2 showed no thrombus and left atrial size was moderately dilated. We will continue stroke work up with MRI brain and CTA head and neck. Pacemaker compatable with MRI, is scheduled for tomorrow.  He has a severe allergy to aspirin, so we will treat with Plavix alone. -clopidogrel 75mg  daily -Rosuvastatin 20 mg daily -f/u MRI brain, likely on 8/3 -f/u CTA head and neck  T2DM: Hemoglobin A1c on 8/2 was 8.8. DM managed at home with Humalog and Victoza. Last CBG this am elevated at 249.  -70/30 insulin 40 units twice daily -Sliding scale insulin with NovoLog -Resume Victoza after discharge, could consider starting an SGLT2 inhibitor as an outpatient.  Chronic HFrEF (LVEF 30-35%) Pt  presenting w/o symptoms of CHF exacerbation, appears euvolemic on exam. Takes furosemide 80mg  daily at home, holding in the setting of his acute presentation. At home patient is on a beta-blocker, statin, and diabetes medication.  -hold home lasix 80mg  daily, may be able to resume tomorrow -hold home metoprolol 100 mg daily for permissive hypertension -continue home rosuvastatin 20 mg  HTN: Vitals in last 24hr have showed SBP ranging from 108-141mmHg and DBP ranging from 48-91 mmHg. Allowing for permissive hypertension given concern for ischemic stroke. Can resume after 24-48hrs.  -holding home lasix and metoprolol  HLD: LDL of 71 on recent lipid panel.  -cont home rosuvastatin 20mg  daily   Mood disorder: -cont home Depakote 500 mg twice daily - cont home Trazodone 200 mg daily at bedtime -cont home Sertraline 150 mg  daily  Diet: heart healthy  VTE ppx: enoxaparin 40 mg daily  Code status: full   Prior to Admission Living Arrangement: Home Anticipated Discharge Location: Home Barriers to Discharge: Clinical improvement   Dispo: Anticipated discharge in 2/3 depending on findings of MRI.    LOS: 0 days   Rickey Barbara T, Medical Student 01/28/2021, 12:57 PM

## 2021-01-28 NOTE — ED Notes (Signed)
Pt returned from CT; [place], CT tech, advised that IV infiltrated while getting contrast, site swollen, bruised; IV removed, Radiologist notified; advised to elevate extremity, rotate application of cold and warm therapy; ice pack currently in place; will continue to monitor

## 2021-01-28 NOTE — Progress Notes (Signed)
Pt has MR conditional pacer, due to programming needs, to be done 01/29/21. Dr. Eather Colas.

## 2021-01-28 NOTE — ED Notes (Signed)
Ice pack removed, heating pack placed on infiltration site; no increase in redness, swelling, or bruising noted; will continue to monitor

## 2021-01-28 NOTE — Progress Notes (Signed)
  Echocardiogram 2D Echocardiogram has been performed.  Luis [name] 01/28/2021, 9:58 AM

## 2021-01-28 NOTE — ED Notes (Signed)
This patient has received approximately 15-20 ml's of IV omni350 contrast, along with 40 ml's of normal saline extravasation into Left antecubital during a CTA Head and Neck exam.  The exam was performed on (date) Tuesday 01/28/21  Site / affected area assessed by Dr Park Breed (Radiologist).

## 2021-01-28 NOTE — ED Notes (Signed)
Pt transported to CT ?

## 2021-01-28 NOTE — Progress Notes (Signed)
PT Cancellation Note  Patient Details Name: Luis [name] MRN: [id number] DOB: February 13, 1971   Cancelled Treatment:    Reason Eval/Treat Not Completed: Medical issues which prohibited therapy.  Pt is dizzy even in bed and declines OOB.  Nursing notified, now leaving for CT.   Ivar Drape 01/28/2021, 10:09 AM  Samul Dada, PT MS Acute Rehab Dept. Number: [facility] [id number] and [facility] [phone]

## 2021-01-28 NOTE — ED Notes (Signed)
Dr. Mcarthur Rossetti at bedside

## 2021-01-28 NOTE — Progress Notes (Signed)
Right lower extremity venous duplex completed. Refer to "CV Proc" under chart review to view preliminary results.  01/28/2021 8:48 AM Eula Fried., MHA, RVT, RDCS, RDMS

## 2021-01-28 NOTE — ED Notes (Signed)
Called and spoke with MRI. Patient has a conditional pacemaker and [employer] is not available until tomorrow. MRI/MRA will be completed tomorrow. Report called to Caryn Bee, RN. Patient to be transferred to floor on stretcher and monitor.

## 2021-01-28 NOTE — Progress Notes (Signed)
Inpatient Diabetes Program Recommendations  AACE/ADA: New Consensus Statement on Inpatient Glycemic Control (2015)  Target Ranges:  Prepandial:   less than 140 mg/dL      Peak postprandial:   less than 180 mg/dL (1-2 hours)      Critically ill patients:  140 - 180 mg/dL   Lab Results  Component Value Date   GLUCAP 346 (H) 01/28/2021   HGBA1C 8.8 (H) 01/27/2021    Review of Glycemic Control Results for Luis, [name] (MRN [id number]) as of 01/28/2021 14:38  Ref. Range 01/28/2021 07:52 01/28/2021 11:55  Glucose-Capillary Latest Ref Range: 70 - 99 mg/dL 627 (H) 035 (H)   Diabetes history: DM2 Outpatient Diabetes medications: 75/25 70 units tid, Victoza 1.8 mg qd Current orders for Inpatient glycemic control: Levemir 20 units hs, Novolog 0-15 units tid  Inpatient Diabetes Program Recommendations:   Called RN Luis [name] and discussed with patient home medications and verified insulin regimen listed above. Please consider: -70/30 insulin 40 units bid (start as soon as possible) last insulin @ home was yesterday morning -Increase Novolog correction to 0-20 units tid ,hs0-5 units -D/C Levemir  Thank you, Luis [name]. Luis Murillo, RN, MSN, CDE  Diabetes Coordinator Inpatient Glycemic Control Team Team Pager [phone] (8am-5pm) 01/28/2021 2:42 PM

## 2021-01-29 ENCOUNTER — Encounter (HOSPITAL_COMMUNITY): Payer: Self-pay | Admitting: Student in an Organized Health Care Education/Training Program

## 2021-01-29 ENCOUNTER — Inpatient Hospital Stay (HOSPITAL_COMMUNITY): Payer: Medicare Other

## 2021-01-29 DIAGNOSIS — R29898 Other symptoms and signs involving the musculoskeletal system: Secondary | ICD-10-CM | POA: Diagnosis not present

## 2021-01-29 LAB — BASIC METABOLIC PANEL
Anion gap: 8 (ref 5–15)
BUN: 12 mg/dL (ref 6–20)
CO2: 26 mmol/L (ref 22–32)
Calcium: 8.7 mg/dL — ABNORMAL LOW (ref 8.9–10.3)
Chloride: 102 mmol/L (ref 98–111)
Creatinine, Ser: 0.76 mg/dL (ref 0.61–1.24)
GFR, Estimated: >60 mL/min (ref >60)
Glucose, Bld: 182 mg/dL — ABNORMAL HIGH (ref 70–99)
Potassium: 4.1 mmol/L (ref 3.5–5.1)
Sodium: 136 mmol/L (ref 135–145)

## 2021-01-29 LAB — GLUCOSE, CAPILLARY
Glucose-Capillary: 193 mg/dL — ABNORMAL HIGH (ref 70–99)
Glucose-Capillary: 262 mg/dL — ABNORMAL HIGH (ref 70–99)
Glucose-Capillary: 187 mg/dL — ABNORMAL HIGH (ref 70–99)
Glucose-Capillary: 238 mg/dL — ABNORMAL HIGH (ref 70–99)

## 2021-01-29 MED ORDER — BUTALBITAL-APAP-CAFFEINE 50-325-40 MG PO TABS
1.0000 | ORAL_TABLET | Freq: Once | ORAL | Status: AC
  Administered 2021-01-29: 1 via ORAL
  Filled 2021-01-29: qty 1

## 2021-01-29 MED ORDER — METOPROLOL SUCCINATE ER 100 MG PO TB24
100.0000 mg | ORAL_TABLET | Freq: Every day | ORAL | Status: DC
  Administered 2021-01-29 – 2021-01-31 (×3): 100 mg via ORAL
  Filled 2021-01-29 (×3): qty 1

## 2021-01-29 MED ORDER — FUROSEMIDE 40 MG PO TABS
80.0000 mg | ORAL_TABLET | Freq: Every day | ORAL | Status: DC
  Administered 2021-01-30 – 2021-01-31 (×2): 80 mg via ORAL
  Filled 2021-01-29 (×3): qty 2

## 2021-01-29 NOTE — Progress Notes (Signed)
STROKE TEAM PROGRESS NOTE    Interval History   No acute events overnight, patient is resting in the bed and is clearly using his right upper extremity purposefully and walking on the phone.  When I told him that he seems to be doing better on observation he says now he still weak on the right side.  When I pointed out that his right upper extremity is clearly better he stated his right leg is still weak.  Continues to show poor effort on motor testing.  There is is giveaway weakness still on exam MRI scan is pending.   Pertinent Lab Work and Imaging    01/27/21 CT Head WO IV Contrast NAICP   01/28/21 CT Head WO Contrast  NAICP   01/28/21 Echocardiogram   1. No LV thrombus is seen. There is marked septal-lateral wall left ventricular dyssynchrony. Left ventricular ejection fraction, by estimation, is 30 to 35%. The left ventricle has moderately decreased function. The left ventricle demonstrates global hypokinesis. Left ventricular diastolic parameters are consistent with Grade II diastolic dysfunction (pseudonormalization). Elevated left atrial pressure.   2. Right ventricular systolic function is normal. The right ventricular size is normal. Tricuspid regurgitation signal is inadequate for assessing PA pressure.   3. Left atrial size was moderately dilated.   4. The mitral valve is normal in structure. Mild mitral valve regurgitation. No evidence of mitral stenosis.   5. The aortic valve is normal in structure. Aortic valve regurgitation is not visualized. No aortic stenosis is present.   6. The inferior vena cava is dilated in size with <50% respiratory variability, suggesting right atrial pressure of 15 mmHg.  Pending MRI Brain, MRA Head and Neck   Physical Examination   Constitutional: Resting in bed in NAD  Cardiovascular: Normal RR Respiratory: No increased WOB   Mental status: AAOx4 Speech: Fluent, repetition and naming intact  Cranial nerves: EOMI, VFF, Face symmetric, Tongue  midline, Shoulder shrug intact  Motor: Normal bulk and tone. Drifts to RUE and RLE. + Hoover test positive.  He has notable giveway weakness when assess strength to the right arm and right leg.  Sensory: States that vibration to face is decreased to the right. Sensation to vibration reduced to the right leg, right arm..  He splits vibration sensation over forehead. Coordination: FNF intact  Gait: Deferred  Assessment and Plan   [name] is a 50 y.o. male w/pmh of CHF, cocaine use, diabetes, hypertension, hyperlipidemia who presents with right-sided numbness and weakness. He was outside the window for IVTPA and thrombectomy.   #Right Sided Weakness Non organic pattern Patient presented with the symptoms described above. At this time, his stroke work up is ongoing. He has had two CTH done on 8/1 and 8/2 both are negative. Vessel imaging is pending with MRA Head and Neck along with MRI Brain. Echo w/EF 30 to 35 %, left atrium moderately dilated. Stroke labs were completed including Lipid panel w/LDL 71 and Hemoglobin A1C 8.8. UDS positive for cocaine. His neurological examination is pertinent for functional findings; give way weakness, positive Hoover test and vibration decreased to the frontal bone on the right side. Given stroke risk factors of diabetes, hypertension, hyperlipidemia will proceed with stroke work up to rule out a stroke. - Continue Plavix 75 mg QD and Crestor 20 mg for stroke prevention  -At discharge please place ambulatory referral to neurology for stroke follow up   #Hypertension #HF  He has a history of HTN, HF and takes  Furosemide, Toprolol, Victoza at home. Currently blood pressure is trending 110-150. No need for permissive HTN given sx started 5 days ago.   #Hyperlipidemia From a stroke prevention stand point, the LDL goal is < 70. LDL is 71, close to goal. Continue Crestor.   #DMII  Hemoglobin A1C this admission noted to be 8.8, not at goal from a  stroke standpoint. Recommend SSI while hospitalized.   Hospital day # 1   .Patient continues to have subjective right-sided weakness with nonorganic pattern with giveaway weakness and poor effort.  CT scan is unremarkable.  He had similar presentation in April with essentially negative work-up.  Recommend MRI but since he has a pacemaker this will need [employer] rep to coordinate it.   Patient encouraged to to quit cocaine abuse which could be contributing.  Delia Heady, MD Medical Director [facility] Pager: [phone] 01/29/2021 2:52 PM   To contact Stroke Continuity provider, please refer to [email or web address]. After hours, contact General Neurology

## 2021-01-29 NOTE — Evaluation (Signed)
Physical Therapy Evaluation Patient Details Name: Luis [name] MRN: [id number] DOB: 02/11/1971 Today's Date: 01/29/2021   History of Present Illness  50 y/o male presented to ED on 8/1 for dizziness, leg pain x 4 days, headache, R sided numbness/weakness. CT head negative for acute abnormalities. MRI pending. PMH of anxiety, biventricular ICD, chronic systolic CHF, cocaine use, DM, HTN, morbid obesity , substance abuse. OSA, PTSD, Refusal of blood transfusions as patient is Jehovah's Witness, and Tobacco abuse.  Clinical Impression  PTA, patient lives alone and reports independence with occasional use of cane outside and in the community. Inconsistencies noted throughout session. Difficult to assess strength via MMT due to patient with poor effort. Able to spontaneously move R LE in bed and hold R LE in extension sitting EOB, however uses L LE to bring R LE off bed during bed mobility. No knee buckling noted throughout mobility. Patient requires min guard for safety during mobility using RW. Patient at times treating R LE as NWB and accepting full body weight on bilateral UE, cues for increasing weightbearing through R LE. Patient subjectively reports decreased light touch. Coordination intact. Patient presents with generalized weakness, impaired balance, and decreased activity tolerance. Patient will benefit from skilled PT services during acute stay to address listed deficits. Recommend SNF at discharge for rehab to assist with maximizing functional independence.     Follow Up Recommendations SNF    Equipment Recommendations  None recommended by PT    Recommendations for Other Services Other (comment) (Psych Consult)     Precautions / Restrictions Precautions Precautions: Fall Restrictions Weight Bearing Restrictions: No      Mobility  Bed Mobility Overal bed mobility: Needs Assistance Bed Mobility: Supine to Sit;Sit to Supine     Supine to sit: Supervision Sit to supine:  Supervision   General bed mobility comments: spontaneously moving R LE in bed but when asked to move towards EOB, patient uses L LE to move R LE off bed    Transfers Overall transfer level: Needs assistance Equipment used: Rolling Toye Rouillard (2 wheeled) Transfers: Sit to/from Stand Sit to Stand: Min guard         General transfer comment: min guard for sit to stand, no physical assist required. Patient in bathroom with PT standing outside bathroom door. Educated patient on need for assistance to stand from toilet. PT heard toilet flush and entered bathroom. Patient had performed pericare and standing on PT entrance with no UE support and what appears to be equal WBing on B LE  Ambulation/Gait Ambulation/Gait assistance: Min guard Gait Distance (Feet): 10 Feet (10') Assistive device: Rolling Paul Trettin (2 wheeled) Gait Pattern/deviations: Decreased stride length;Step-to pattern;Decreased stance time - right;Decreased step length - right;Decreased weight shift to right;Wide base of support Gait velocity: decreased   General Gait Details: At times, patient treating R LE as NWB and able to uphold body weight with both R and L UE with no difficulty. Cues for WBing through R LE with no buckling noted. Min guard for safety. One instance of posterolateral LOB to L but able to self correct. Inconsistent presentation throughout. Patient was able to stand with no UE support and perform hand hygiene.  Stairs            Wheelchair Mobility    Modified Rankin (Stroke Patients Only)       Balance Overall balance assessment: Needs assistance;History of Falls Sitting-balance support: No upper extremity supported;Feet supported Sitting balance-Leahy Scale: Good     Standing balance support: Bilateral  upper extremity supported Standing balance-Leahy Scale: Poor Standing balance comment: appears to rely on UE support at times                             Pertinent Vitals/Pain Pain  Assessment: Faces Faces Pain Scale: Hurts even more Pain Location: Headache Pain Descriptors / Indicators: Aching Pain Intervention(s): Limited activity within patient's tolerance;Monitored during session;Repositioned    Home Living Family/patient expects to be discharged to:: Private residence Living Arrangements: Alone   Type of Home: Apartment Home Access: Stairs to enter Entrance Stairs-Rails: None Entrance Stairs-Number of Steps: 2 Home Layout: One level Home Equipment: Jetaime Pinnix - 2 wheels;Cane - single point;Grab bars - toilet;Grab bars - tub/shower;Hand held shower head      Prior Function Level of Independence: Independent with assistive device(s)         Comments: does not drive, used cane as needed, limited IADLs, reports 3 falls recently where he needed assistance from neighbor to stand     Hand Dominance        Extremity/Trunk Assessment   Upper Extremity Assessment Upper Extremity Assessment: Defer to OT evaluation    Lower Extremity Assessment Lower Extremity Assessment: RLE deficits/detail RLE Deficits / Details: Able to hold R knee in extension sitting EOB and able to spontaneously flex knee but MMT 3+/5. Functionally >3+/5 with no knee buckling noted with WBing. Spontaneously moving R LE in bed but uses L LE to bring R LE off bed. Inconsistent performance throughout RLE Sensation: decreased light touch (inconsistent) RLE Coordination: WNL (Coordination intact)    Cervical / Trunk Assessment Cervical / Trunk Assessment: Other exceptions (increased body habitus)  Communication   Communication: No difficulties  Cognition Arousal/Alertness: Awake/alert Behavior During Therapy: Flat affect Overall Cognitive Status: No family/caregiver present to determine baseline cognitive functioning                                 General Comments: Following all commands with increased time. Flat affect throughout. Initially unwilling to participate but  with encouragement patient in agreement      General Comments      Exercises     Assessment/Plan    PT Assessment Patient needs continued PT services  PT Problem List Decreased strength;Decreased activity tolerance;Decreased mobility;Decreased balance;Decreased cognition;Decreased safety awareness;Impaired sensation       PT Treatment Interventions DME instruction;Gait training;Stair training;Functional mobility training;Therapeutic activities;Therapeutic exercise;Neuromuscular re-education;Balance training;Patient/family education    PT Goals (Current goals can be found in the Care Plan section)  Acute Rehab PT Goals Patient Stated Goal: to get back to normal PT Goal Formulation: With patient Time For Goal Achievement: 02/12/21 Potential to Achieve Goals: Good    Frequency Min 3X/week   Barriers to discharge        Co-evaluation               AM-PAC PT "6 Clicks" Mobility  Outcome Measure Help needed turning from your back to your side while in a flat bed without using bedrails?: A Little Help needed moving from lying on your back to sitting on the side of a flat bed without using bedrails?: A Little Help needed moving to and from a bed to a chair (including a wheelchair)?: A Little Help needed standing up from a chair using your arms (e.g., wheelchair or bedside chair)?: A Little Help needed to walk in hospital room?: A  Little Help needed climbing 3-5 steps with a railing? : A Little 6 Click Score: 18    End of Session Equipment Utilized During Treatment: Gait belt Activity Tolerance: Patient tolerated treatment well Patient left: in bed;with call bell/phone within reach;with bed alarm set Nurse Communication: Mobility status PT Visit Diagnosis: Unsteadiness on feet (R26.81);Muscle weakness (generalized) (M62.81);Difficulty in walking, not elsewhere classified (R26.2);Other symptoms and signs involving the nervous system (R29.898)    Time: 4008-6761 PT Time  Calculation (min) (ACUTE ONLY): 21 min   Charges:   PT Evaluation $PT Eval Moderate Complexity: 1 Mod          Yumiko Alkins A. Dan Humphreys PT, DPT Acute Rehabilitation Services Pager [phone] Office [phone]   Viviann Spare 01/29/2021, 1:03 PM

## 2021-01-29 NOTE — TOC CAGE-AID Note (Signed)
Transition of Care [facility]) - CAGE-AID Screening   Patient Details  Name: Luis [name] MRN: [id number] Date of Birth: 1971/04/14  Transition of Care [facility]) CM/SW Contact:    Tom-Johnson, Hershal Coria, RN Phone Number: 01/29/2021, 1:54 PM   Clinical Narrative: Patient denies recent use of alcohol, illicit drugs and smoking. Stated he last used illicit drugs two weeks ago. Declined any counseling resources. List of substance abuse treatment given to patient at bedside.    CAGE-AID Screening:    Have You Ever Felt You Ought to Cut Down on Your Drinking or Drug Use?: Yes Have People Annoyed You By Critizing Your Drinking Or Drug Use?: No Have You Felt Bad Or Guilty About Your Drinking Or Drug Use?: No Have You Ever Had a Drink or Used Drugs First Thing In The Morning to Steady Your Nerves or to Get Rid of a Hangover?: No CAGE-AID Score: 1  Substance Abuse Education Offered: Yes (Declined)  Substance abuse interventions: Patient Counseling, Transport planner

## 2021-01-29 NOTE — Hospital Course (Addendum)
Outpatient: [ ]  neurology f/u [ ]  psych f/u [ ]  SGLT2i  Right leg weakness Intractable headache Mr. 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 He was admitted for evaluation of paresis and work up for stroke. On presentation, pt also endorsed vertigo, nausea, decreased appetite, left sided headache, anxiety and social stressors. Work up of his right sided weakness and paresthesia included CT and MRI angio. CT head on 8/1 showed no acute intracranial changes. CT head and neck angio was unsuccessful due to contrast extravasation injury. MRI angio on 8/3 was motion limited and it showed no acute intracranial abnormalities, no large vessel occlusion or stenosis and a 43mm left paraclinoid ICA aneurysm. Swallow study on 8/4 found oropharyngeal function that is within normal function limits. Persistent headache treated 3 times with Fioricet and later with IV phenergan. Treated with antiplatelets, Plavix 75 mg, thought out hospitalization. Discharged to *** on *** with a neurology appointment on ***.   T2DM Pt with last Hemoglobin A1c on 8/2 of 8.8. We held home Victoza and resumed at discharge on ***.  Chronic HFrEF (LVEF 30-35%) s/p biventricular pacemaker  Was euvolemic throughout hospitalization. Held home lasix and metoprolol given concern of ischemic stoke to allow for permissive hypertension on day one of hospitalization. Pt resumed home metoprolol and lasix on day 2. He was also maintained on home rosuvastatin 20mg  daily.   HTN Held home metoprolol and lasix on day 1 but resumed on day 2.   HLD Lipid panel on 8/1 showed LDL of 71 and HDL of 39. Pt maintained on home rosuvastatin through out his hospitalization.   Mood disorder Pt presented with hx of cocaine induced mood disorder  and PTSD. UDS on admission was positive for cocaine. Pt resumed home Depakote 500 mg twice daily, Trazodone 200 mg daily at bedtime and Sertraline 150 mg daily.

## 2021-01-29 NOTE — Progress Notes (Signed)
Occupational Therapy Evaluation Patient Details Name: Luis [name] MRN: [id number] DOB: 27-Jul-1970 Today's Date: 01/29/2021    History of Present Illness 50 y/o male presented to ED on 8/1 for dizziness, leg pain x 4 days, headache, R sided numbness/weakness. CT head negative for acute abnormalities. MRI pending. PMH of anxiety, biventricular ICD, chronic systolic CHF, cocaine use, DM, HTN, morbid obesity , substance abuse. OSA, PTSD, Refusal of blood transfusions as patient is Jehovah's Witness, and Tobacco abuse.   Clinical Impression   PTA pt lives independently alone. Occasionally uses a cane outside/in the community. Does not drive. Pt with inconsistent performance throughout session. Pt using RUE spontaneously and functionally throughout assessment however had difficulty moving RUE on command. Holding RLE in extension when sitting EOB however had difficulty lifting RLE off bed. +Hoover's Test. Min A +2 for safety to stand. Complaining of feeling dizzy the entire session, which did not worsen with mobility. At times pt did not respond to this therapist because he "could not hear". Pt is complaining of "blurred vision" and does not identifying any targets in R field during confrontation testing, however has no difficulty locating items in R field functionally. Given functional level of decline recommend rehab at [facility]. Will follow acutely.     Follow Up Recommendations  SNF    Equipment Recommendations  3 in 1 bedside commode    Recommendations for Other Services  Psych Consult     Precautions / Restrictions Precautions Precautions: Fall      Mobility Bed Mobility Overal bed mobility: Needs Assistance Bed Mobility: Supine to Sit;Sit to Supine     Supine to sit: Supervision Sit to supine: Supervision   General bed mobility comments: Ablet o move covers adn lift RLE using LLE to mobilize to EOB with    Transfers Overall transfer level: Needs assistance   Transfers: Sit  to/from Stand Sit to Stand: Min assist;+2 physical assistance         General transfer comment: pt comlaning of feeling dizzy. No give away in RLE noted when standing. able to shift weight from R to LLE without buckling noted    Balance Overall balance assessment: Needs assistance;History of Falls   Sitting balance-Leahy Scale: Good     Standing balance support: Bilateral upper extremity supported Standing balance-Leahy Scale: Poor Standing balance comment: Pt released RW at one point to catch shorts which were dropping without overt LOB                           ADL either performed or assessed with clinical judgement   ADL Overall ADL's : Needs assistance/impaired Eating/Feeding: Modified independent   Grooming: Set up;Sitting   Upper Body Bathing: Set up;Sitting   Lower Body Bathing: Moderate assistance;Sit to/from stand   Upper Body Dressing : Set up;Sitting   Lower Body Dressing: Moderate assistance;Sit to/from stand   Toilet Transfer: Minimal assistance;+2 for physical assistance;RW (sit - stand only)     Toileting - Clothing Manipulation Details (indicate cue type and reason): has been independently using urinal @ bed level     Functional mobility during ADLs: Minimal assistance;Rolling walker       Vision Baseline Vision/History: Wears glasses Wears Glasses: Reading only Patient Visual Report: Blurring of vision Vision Assessment?: Yes Eye Alignment: Within Functional Limits Ocular Range of Motion: Within Functional Limits Alignment/Gaze Preference: Within Defined Limits Tracking/Visual Pursuits: Able to track stimulus in all quads without difficulty Saccades: Within functional limits Convergence: Within  functional limits Visual Fields: Impaired-to be further tested in functional context;Right visual field deficit Additional Comments: Pt not identifying any targets in R visual filed until object is directily in front of his nose      Perception Perception Comments: No apparent deficits   Praxis Praxis Praxis tested?: Within functional limits    Pertinent Vitals/Pain Pain Assessment: 0-10 Pain Score: 10-Worst pain ever Pain Location: Headache Pain Descriptors / Indicators: Aching Pain Intervention(s): Patient requesting pain meds-RN notified;Limited activity within patient's tolerance;Premedicated before session     Hand Dominance Right   Extremity/Trunk Assessment Upper Extremity Assessment Upper Extremity Assessment: RUE deficits/detail RUE Deficits / Details: give away weaknessnoted; Pt usingR UE spontaneously,however increased difficulty with moving on command; Weight bearing through RUE without difficulty during bed mobility however on command diffiulty lifting arm; RUE @ 3+/5 RUE with exception RUE Sensation: decreased light touch (inconsistent) RUE Coordination: decreased fine motor;decreased gross motor   Lower Extremity Assessment Lower Extremity Assessment: Defer to PT evaluation;RLE deficits/detail RLE Deficits / Details: holding leg in extension when sitting EOB however unable to bend leg; when lying supine unable to lift leg off bed RLE Sensation:  (does not feel toe up/down during proprioception test)   Cervical / Trunk Assessment Cervical / Trunk Assessment: Other exceptions (increased body habitus)   Communication Communication Communication: No difficulties   Cognition Arousal/Alertness: Awake/alert Behavior During Therapy: Flat affect Overall Cognitive Status: No family/caregiver present to determine baseline cognitive functioning                                 General Comments: slow processing;at one point pt was not responding; after @ 1 min pt stated "I can't hear you and I can't read your lips because of your mask";however pt able to understand majority of conversation without difficulty   General Comments       Exercises     Shoulder Instructions      Home  Living Family/patient expects to be discharged to:: Private residence Living Arrangements: Alone   Type of Home: Apartment Home Access: Stairs to enter [employer] of Steps: 2 Entrance Stairs-Rails: None Home Layout: One level     Bathroom Shower/Tub: Producer, television/film/video: Standard Bathroom Accessibility: Yes How Accessible: Accessible via walker Home Equipment: Walker - 2 wheels;Cane - single point;Grab bars - toilet;Grab bars - tub/shower;Hand held shower head          Prior Functioning/Environment Level of Independence: Independent with assistive device(s) (used cane when he went outside)        Comments: does not drive, used cane as needed, limited IADLs, reports 2 falls recently wherehe needed assistance formneighbor to stand        OT Problem List: Decreased strength;Decreased range of motion;Decreased activity tolerance;Impaired balance (sitting and/or standing);Impaired vision/perception;Decreased coordination;Decreased safety awareness;Decreased knowledge of use of DME or AE;Impaired sensation;Obesity;Impaired UE functional use;Pain      OT Treatment/Interventions: Self-care/ADL training;Therapeutic exercise;Neuromuscular education;DME and/or AE instruction;Therapeutic activities;Visual/perceptual remediation/compensation;Balance training;Patient/family education    OT Goals(Current goals can be found in the care plan section) Acute Rehab OT Goals Patient Stated Goal: to get back to normal OT Goal Formulation: With patient Time For Goal Achievement: 02/12/21 Potential to Achieve Goals: Good  OT Frequency: Min 2X/week   Barriers to D/C: Decreased caregiver support          Co-evaluation  AM-PAC OT "6 Clicks" Daily Activity     Outcome Measure Help from another person eating meals?: None Help from another person taking care of personal grooming?: A Little Help from another person toileting, which includes using  toliet, bedpan, or urinal?: A Lot Help from another person bathing (including washing, rinsing, drying)?: A Lot Help from another person to put on and taking off regular upper body clothing?: A Little Help from another person to put on and taking off regular lower body clothing?: A Lot 6 Click Score: 16   End of Session Equipment Utilized During Treatment: Gait belt;Rolling walker Nurse Communication: Mobility status  Activity Tolerance: Patient tolerated treatment well Patient left: in bed;with call bell/phone within reach;with bed alarm set  OT Visit Diagnosis: Unsteadiness on feet (R26.81);Other abnormalities of gait and mobility (R26.89);Muscle weakness (generalized) (M62.81);History of falling (Z91.81);Pain;Hemiplegia and hemiparesis Hemiplegia - Right/Left: Right Hemiplegia - dominant/non-dominant: Dominant Hemiplegia - caused by: Unspecified Pain - Right/Left: Right Pain - part of body: Ankle and joints of foot (head)                Time: 0930-1001 OT Time Calculation (min): 31 min Charges:  OT General Charges $OT Visit: 1 Visit OT Evaluation $OT Eval Moderate Complexity: 1 Mod OT Treatments $Self Care/Home Management : 8-22 mins  Luisa Dago, OT/L   Acute OT Clinical Specialist Acute Rehabilitation Services Pager [phone] Office [phone]   [facility] 01/29/2021, 11:15 AM

## 2021-01-29 NOTE — Evaluation (Addendum)
Clinical/Bedside Swallow Evaluation Patient Details  Name: Luis [name] MRN: [id number] Date of Birth: 11/20/1970  Today's Date: 01/29/2021 Time: SLP Start Time (ACUTE ONLY): 1437 SLP Stop Time (ACUTE ONLY): 1451 SLP Time Calculation (min) (ACUTE ONLY): 14 min  Past Medical History:  Past Medical History:  Diagnosis Date   Anxiety    Biventricular ICD (implantable cardioverter-defibrillator) in place    Chronic systolic CHF (congestive heart failure) (HCC)    Cocaine use    Diabetes mellitus without complication (HCC)    GERD (gastroesophageal reflux disease)    HLD (hyperlipidemia)    Hypertension    Morbid obesity (HCC)    NICM (nonischemic cardiomyopathy) (HCC)    OSA (obstructive sleep apnea)    PTSD (post-traumatic stress disorder)    on Depakote   Refusal of blood transfusions as patient is Jehovah's Witness    Tobacco abuse    Past Surgical History:  Past Surgical History:  Procedure Laterality Date   BIV ICD INSERTION CRT-D     FOOT FRACTURE SURGERY     ROTATOR CUFF REPAIR     TONSILLECTOMY     HPI:  50 y/o male presented to ED on 8/1 for dizziness, leg pain x 4 days, headache, R sided numbness/weakness. CT head negative for acute abnormalities. MRI pending. PMH of anxiety, biventricular ICD, chronic systolic CHF, cocaine use, DM, HTN, morbid obesity , substance abuse. OSA, PTSD, Refusal of blood transfusions as patient is Jehovah's Witness, and Tobacco abuse.   Assessment / Plan / Recommendation Clinical Impression  Pt's oropharyngeal swallowing appears to be within gross functional limits, although he has subjective reports of difficulty. Although his reported symptoms are R-sided, he initially says he cannot bring his tongue over to his R. It is midline upon protrusion though, and he is able to lateralize it to both sides when cued. He says that he had trouble getting his hamburger down at lunch, but his plate is completely cleared. He also reports having  trouble with sensation on the R side of his oral cavity, but functionally he achieves good seal and has no pocketing or residue. He is missing some teeth and needs some liquid washes to clear his oral cavity, but he has a well-formed bolus. Would continue with regular solids and thin liquids, allowing pt to select what he feels like he can eat with his current dentition. SLP to sign off for swallowing. Please reorder wtih any acute changes or if cognitive-linguistic eval is wanted. SLP Visit Diagnosis: Dysphagia, unspecified (R13.10)    Aspiration Risk  No limitations    Diet Recommendation Regular;Thin liquid   Liquid Administration via: Cup;Straw Medication Administration: Whole meds with liquid Supervision: Patient able to self feed;Intermittent supervision to cue for compensatory strategies Postural Changes: Seated upright at 90 degrees    Other  Recommendations Oral Care Recommendations: Oral care BID   Follow up Recommendations None      Frequency and Duration            Prognosis Prognosis for Safe Diet Advancement: Good      Swallow Study   General HPI: 50 y/o male presented to ED on 8/1 for dizziness, leg pain x 4 days, headache, R sided numbness/weakness. CT head negative for acute abnormalities. MRI pending. PMH of anxiety, biventricular ICD, chronic systolic CHF, cocaine use, DM, HTN, morbid obesity , substance abuse. OSA, PTSD, Refusal of blood transfusions as patient is Jehovah's Witness, and Tobacco abuse. Type of Study: Bedside Swallow Evaluation Previous Swallow Assessment: none  in chart Diet Prior to this Study: Regular;Thin liquids Temperature Spikes Noted: No Respiratory Status: Room air History of Recent Intubation: No Behavior/Cognition: Alert;Cooperative;Pleasant mood Oral Cavity Assessment: Within Functional Limits Oral Care Completed by SLP: No Oral Cavity - Dentition: Adequate natural dentition;Missing dentition Vision: Functional for  self-feeding Self-Feeding Abilities: Able to feed self Patient Positioning: Upright in bed Baseline Vocal Quality: Normal Volitional Cough: Weak (pt says he does not want to cough harder because he has a headache) Volitional Swallow: Able to elicit    Oral/Motor/Sensory Function Overall Oral Motor/Sensory Function: Within functional limits (inconsistencies in testing but able to demonstrate [facility] with cues)   Ice Chips Ice chips: Not tested   Thin Liquid Thin Liquid: Within functional limits Presentation: Self Fed;Straw;Cup    Nectar Thick Nectar Thick Liquid: Not tested   Honey Thick Honey Thick Liquid: Not tested   Puree Puree: Within functional limits Presentation: Self Fed;Spoon   Solid     Solid: Within functional limits Presentation: Self Fed      Mahala Menghini., M.A. CCC-SLP Acute Rehabilitation Services Pager [phone] Office [phone]  01/29/2021,3:00 PM

## 2021-01-29 NOTE — TOC Initial Note (Signed)
Transition of Care [facility]) - Initial/Assessment Note    Patient Details  Name: Luis [name] MRN: [id number] Date of Birth: [birth year]  Transition of Care [facility]) CM/SW Contact:    Tom-Johnson, Hershal Coria, RN Phone Number: 01/29/2021, 2:37 PM  Clinical Narrative:                 Case Manager consulted for Substance abuse. Patient states it's been a while since he stopped drinking and taking illicit drugs. States he last used drugs two weeks ago. Recommendations for skilled Nursing Rehab and patient agreed to be faxed out to [facility]. Patient informed by Case manager that one of his barriers of being accepted is his substance abuse. Will provide bed offers when available. Will continue to follow.  Expected Discharge Plan: Skilled Nursing Facility Barriers to Discharge: Continued Medical Work up   Patient Goals and CMS Choice   CMS Medicare.gov Compare Post Acute Care list provided to:: Patient Choice offered to / list presented to : Patient  Expected Discharge Plan and Services Expected Discharge Plan: Skilled Nursing Facility   Discharge Planning Services: CM Consult Post Acute Care Choice: Skilled Nursing Facility Living arrangements for the past 2 months: Single Family Home                                      Prior Living Arrangements/Services Living arrangements for the past 2 months: Single Family Home Lives with:: Self Patient language and need for interpreter reviewed:: Yes Do you feel safe going back to the place where you live?: Yes      Need for Family Participation in Patient Care: Yes (Comment) Care giver support system in place?: Yes (comment) Current home services: DME [facility], [facility]) Criminal Activity/Legal Involvement Pertinent to Current Situation/Hospitalization: No - Comment as needed  Activities of Daily Living      Permission Sought/Granted Permission sought to share information with : Case Manager, Passenger transport manager granted to share information with : Yes, Verbal Permission Granted              Emotional Assessment Appearance:: Appears stated age Attitude/Demeanor/Rapport: Engaged Affect (typically observed): Accepting Orientation: : Oriented to Self, Oriented to Place, Oriented to  Time, Oriented to Situation Alcohol / Substance Use: Alcohol Use, Illicit Drugs (States he quit two weeks ago.) Psych Involvement: Yes (comment) (Recommended by PT/OT.)  Admission diagnosis:  Weakness [R53.1] CVA (cerebral vascular accident) (HCC) [I63.9] Stroke [facility]) [I63.9] Right sided weakness [R53.1] Patient Active Problem List   Diagnosis Date Noted   Right arm weakness 01/28/2021   Right sided weakness 01/28/2021   Nonischemic cardiomyopathy (HCC)    Morbid obesity (HCC) 05/05/2019   Cocaine use disorder (HCC)    Chronic systolic CHF (congestive heart failure) (HCC) 01/17/2019   Pacemaker    HLD (hyperlipidemia)    Diabetes mellitus without complication (HCC)    Hypertension    GERD (gastroesophageal reflux disease)    Tobacco abuse    PCP:  Margot Ables, MD Pharmacy:   [facility] - [place], [place] - [address] [place] [address] Phone: [phone] Fax: [phone]     Social Determinants of Health (SDOH) Interventions    Readmission Risk Interventions No flowsheet data found.

## 2021-01-29 NOTE — Progress Notes (Signed)
Subjective:  Luis [name] continues to have headaches this morning. He reports that the Fioricet helped yesterday and we tx with additional Fioricet this am. He reports that he is not albe to lift his leg off the bed this am. Reports that he has little sensation over the right side of body. Also having trouble with memory this am. He is scheduled for an MRI today. Worked with OT this am and waiting to be seen by PT this afternoon.  He continues to have pain and bruising from the contrast extravasation injury he suffered yesterday. Swelling has decreased but continues to have soreness and sensation intact.   Objective: Physical exam: General: Lying in bed. In no acute distress.  MSK: Spontaneously moves upper extremities. Able to move bilateral legs against gravity.  Neuro: Reports decreased sensation on right side of body.  Psych: Non-pressured speech. Appropriate eye contact. Depressed affect.   Vital signs in last 24 hours: Vitals:   01/28/21 1940 01/28/21 2253 01/28/21 2357 01/29/21 0310  BP: 125/62  (!) 158/78 (!) 147/56  Pulse: 82 60 60 60  Resp: 18 16 18 20   Temp: 99.2 F (37.3 C)  98.2 F (36.8 C) 98.1 F (36.7 C)  TempSrc: Oral     SpO2: 96% 96% 97% 98%   Weight change:   Intake/Output Summary (Last 24 hours) at 01/29/2021 0729 Last data filed at 01/29/2021 0700 Gross per 24 hour  Intake --  Output 850 ml  Net -850 ml   BMP Latest Ref Rng & Units 01/29/2021 01/28/2021 01/27/2021  Glucose 70 - 99 mg/dL 03/29/2021) 937(T) -  BUN 6 - 20 mg/dL 12 9 -  Creatinine 024(O - 1.24 mg/dL 9.73 5.32 9.92  Sodium 135 - 145 mmol/L 136 135 -  Potassium 3.5 - 5.1 mmol/L 4.1 4.6 -  Chloride 98 - 111 mmol/L 102 103 -  CO2 22 - 32 mmol/L 26 25 -  Calcium 8.9 - 10.3 mg/dL 4.26) 8.3(M) -   Respiratory panel: negative   MRI angio: pending    Assessment/Plan:  Principal Problem:   Right arm weakness Active Problems:   Chronic systolic CHF (congestive heart failure) (HCC)   Pacemaker    Diabetes mellitus without complication (HCC)   Hypertension   Cocaine use disorder (HCC)   Right sided weakness  Luis [name] is a 50yo M with pmhx of T2MD, chronic HFrEF s/p pacemaker, hypertension, hyperlipidemia, hx of cocaine abuse and mood disorders here with right leg paresis and decreased sensation concerning for acute cerebral infarct vs functional neurologic disorder.   Possible cerebral infarction needing r/o Right sided motor and sensory deficit Pt here with right leg paresis, and decreased sensation. CT head has shown no acute intracranial changes. On physical exam, inconsistent weakness of right leg. Per neuro, pt with positive hoover sign. Further workup today with MRI angio for distinction of organic vs non-organic paresis of the leg.  -continue clopidogrel 75mg  daily and rosuvastatin 20mg  daily -f/u MRI angio  -outpatient: ambulatory referral to neurology for stroke f/u  T2DM:  Hemoglobin A1c on 8/2 was 8.8. Last glucose of 182 on. Holding home Victoza.  -SSI -continue 70/30 insulin 40 units twice daily -Resume Victoza after discharge, could consider starting an SGLT2 inhibitor as an outpatient.  Chronic HFrEF (LVEF 30-35%) Pt presenting w/o symptoms of CHF exacerbation, appears euvolemic on exam.  At home patient is on a beta-blocker, statin, and diabetes medication.Takes furosemide 80mg  daily at home, will restart today.  -continue home lasix 80mg   daily -start home metoprolol-succinate 100 mg daily -continue home rosuvastatin 20mg  daily    HTN: SBP ranging from 120-158 in last 24 hours.  -start home metoprolol-succinate 100 mg daily and lasix 80 mg daily   HLD: LDL of 71 on recent lipid panel. LDL goal of <70.  -cont home rosuvastatin 20mg  daily  Mood disorder: Hx of cocaine induced mood disorder and PTSD.  -cont home Depakote 500 mg twice daily -cont home Trazodone 200 mg daily at bedtime -cont home Sertraline 150 mg daily   Diet: heart healthy  VTE  ppx: enoxaparin 40 mg daily  Code status: full   Prior to Admission Living Arrangement: Home Anticipated Discharge Location: Home Barriers to Discharge: Clinical improvement   Dispo: Anticipated discharge in 2/3 days depending on findings of MRI.     LOS: 1 day   T, Medical Student 01/29/2021, 7:29 AM

## 2021-01-29 NOTE — Progress Notes (Signed)
Re: Luis [name] DOB:Apr 04, 1971 Date:01/29/2021   To Whom It May Concern:  Please be advised that the above-named patient will require a short-term nursing home stay--anticipated 30 days or less for rehabilitation and strengthening. The plan is for home.

## 2021-01-29 NOTE — TOC CAGE-AID Note (Signed)
Transition of Care [facility]) - CAGE-AID Screening   Patient Details  Name: Luis [name] MRN: [id number] Date of Birth: 1970/09/10  Transition of Care [facility]) CM/SW Contact:    Tom-Johnson, Hershal Coria, RN Phone Number: 01/29/2021, 2:31 PM   Clinical Narrative:    CAGE-AID Screening:    Have You Ever Felt You Ought to Cut Down on Your Drinking or Drug Use?: Yes Have People Annoyed You By Critizing Your Drinking Or Drug Use?: No Have You Felt Bad Or Guilty About Your Drinking Or Drug Use?: No Have You Ever Had a Drink or Used Drugs First Thing In The Morning to Steady Your Nerves or to Get Rid of a Hangover?: No CAGE-AID Score: 1  Substance Abuse Education Offered: Yes (Declined)  Substance abuse interventions: Patient Counseling, Educational Materials

## 2021-01-29 NOTE — Progress Notes (Signed)
   01/29/21 2123  Provider Notification  Provider Name/Title Dr Bary Richard  Date Provider Notified 01/29/21  Time Provider Notified 2123  Notification Type Page  Notification Reason Other (Comment) (pt c/o of severe headache, refused Tylenol)  Provider response See new orders  Date of Provider Response 01/29/21  Time of Provider Response 2125

## 2021-01-29 NOTE — NC FL2 (Signed)
[place] MEDICAID FL2 LEVEL OF CARE SCREENING TOOL     IDENTIFICATION  Patient Name: Luis [name] Birthdate: Nov 17, 1970 Sex: male Admission Date (Current Location): 01/27/2021  [facility] and [place] Number:  Producer, television/film/video and Address:         Provider Number: [id number]  Attending Physician Name and Address:  Tyson Alias, *  Relative Name and Phone Number:       Current Level of Care: Hospital Recommended Level of Care: Skilled Nursing Facility Prior Approval Number:    Date Approved/Denied:   PASRR Number: Under review  Discharge Plan: SNF    Current Diagnoses: Patient Active Problem List   Diagnosis Date Noted   Right arm weakness 01/28/2021   Right sided weakness 01/28/2021   Nonischemic cardiomyopathy (HCC)    Morbid obesity (HCC) 05/05/2019   Cocaine use disorder (HCC)    Chronic systolic CHF (congestive heart failure) (HCC) 01/17/2019   Pacemaker    HLD (hyperlipidemia)    Diabetes mellitus without complication (HCC)    Hypertension    GERD (gastroesophageal reflux disease)    Tobacco abuse     Orientation RESPIRATION BLADDER Height & Weight     Self, Time, Situation, Place  Normal Continent Weight:   Height:     BEHAVIORAL SYMPTOMS/MOOD NEUROLOGICAL BOWEL NUTRITION STATUS      Continent Diet (Carbmod)  AMBULATORY STATUS COMMUNICATION OF NEEDS Skin   Limited Assist Verbally Normal                       Personal Care Assistance Level of Assistance  Bathing, Dressing, Feeding Bathing Assistance: Limited assistance Feeding assistance: Independent Dressing Assistance: Limited assistance     Functional Limitations Info  Sight, Hearing, Speech Sight Info: Adequate Hearing Info: Adequate Speech Info: Adequate    SPECIAL CARE FACTORS FREQUENCY  PT (By licensed PT), OT (By licensed OT)     PT Frequency: 5x/week OT Frequency: 5x/week            Contractures Contractures Info: Not present    Additional  Factors Info  Code Status, Allergies, Psychotropic, Insulin Sliding Scale Code Status Info: Full Allergies Info: Aspirin, Iodine-131, Iodinated agent, latex, Penicillin, Tramadol, Amoxicillin-Pot Clavulanate, Lidocaine, Lisinopril. Psychotropic Info: Depakote, Zoloft, Trazadone Insulin Sliding Scale Info: Novolog 0-15 Units 3 times daily with meals, Levemir 20 Units twice a day       Current Medications (01/29/2021):  This is the current hospital active medication list Current Facility-Administered Medications  Medication Dose Route Frequency Provider Last Rate Last Admin   acetaminophen (TYLENOL) tablet 650 mg  650 mg Oral Q6H PRN Quincy Simmonds, MD   650 mg at 01/29/21 9892   Or   acetaminophen (TYLENOL) suppository 650 mg  650 mg Rectal Q6H PRN Quincy Simmonds, MD       clopidogrel (PLAVIX) tablet 75 mg  75 mg Oral Daily Quincy Simmonds, MD   75 mg at 01/29/21 0907   divalproex (DEPAKOTE) DR tablet 500 mg  500 mg Oral Q12H Quincy Simmonds, MD   500 mg at 01/29/21 0907   enoxaparin (LOVENOX) injection 40 mg  40 mg Subcutaneous Daily Quincy Simmonds, MD   40 mg at 01/29/21 1194   furosemide (LASIX) tablet 80 mg  80 mg Oral Daily Evlyn Kanner, MD       insulin aspart (novoLOG) injection 0-15 Units  0-15 Units Subcutaneous TID WC Quincy Simmonds, MD   8 Units at 01/29/21 1234   insulin aspart (novoLOG)  injection 10 Units  10 Units Subcutaneous TID WC Aslam, Sadia, MD   10 Units at 01/29/21 1235   insulin detemir (LEVEMIR) injection 20 Units  20 Units Subcutaneous BID Aslam, Leanna Sato, MD   20 Units at 01/29/21 0907   LORazepam (ATIVAN) injection 1 mg  1 mg Intravenous Once PRN Aslam, Leanna Sato, MD       metoprolol succinate (TOPROL-XL) 24 hr tablet 100 mg  100 mg Oral Daily Evlyn Kanner, MD       pantoprazole (PROTONIX) EC tablet 40 mg  40 mg Oral Daily Quincy Simmonds, MD   40 mg at 01/29/21 8527   rosuvastatin (CRESTOR) tablet 20 mg  20 mg Oral QPM Quincy Simmonds, MD   20 mg at 01/28/21 1737    sertraline (ZOLOFT) tablet 150 mg  150 mg Oral Daily Quincy Simmonds, MD   150 mg at 01/29/21 7824   traZODone (DESYREL) tablet 200 mg  200 mg Oral QHS Quincy Simmonds, MD   200 mg at 01/28/21 2217     Discharge Medications: Please see discharge summary for a list of discharge medications.  Relevant Imaging Results:  Relevant Lab Results:   Additional Information SSN#- [id number]  Tom-Johnson, Hershal Coria, RN

## 2021-01-29 NOTE — Plan of Care (Signed)
  Problem: Education: Goal: Knowledge of disease or condition will improve Outcome: Progressing Goal: Knowledge of secondary prevention will improve Outcome: Progressing Goal: Knowledge of patient specific risk factors addressed and post discharge goals established will improve Outcome: Progressing Goal: Individualized Educational Video(s) Outcome: Progressing   

## 2021-01-29 NOTE — Progress Notes (Signed)
Pt medicated for exam, was unable to remain still enough for diagnostic MRA of the neck. Dr. Mcarthur Rossetti and Pearlean Brownie aware.

## 2021-01-30 DIAGNOSIS — R29898 Other symptoms and signs involving the musculoskeletal system: Secondary | ICD-10-CM | POA: Diagnosis not present

## 2021-01-30 LAB — CBC
HCT: 42.6 % (ref 39.0–52.0)
Hemoglobin: 14.1 g/dL (ref 13.0–17.0)
MCH: 29.3 pg (ref 26.0–34.0)
MCHC: 33.1 g/dL (ref 30.0–36.0)
MCV: 88.4 fL (ref 80.0–100.0)
Platelets: 218 10*3/uL (ref 150–400)
RBC: 4.82 MIL/uL (ref 4.22–5.81)
RDW: 12.7 % (ref 11.5–15.5)
WBC: 8.6 10*3/uL (ref 4.0–10.5)
nRBC: 0 % (ref 0.0–0.2)

## 2021-01-30 LAB — GLUCOSE, CAPILLARY
Glucose-Capillary: 150 mg/dL — ABNORMAL HIGH (ref 70–99)
Glucose-Capillary: 131 mg/dL — ABNORMAL HIGH (ref 70–99)
Glucose-Capillary: 122 mg/dL — ABNORMAL HIGH (ref 70–99)
Glucose-Capillary: 135 mg/dL — ABNORMAL HIGH (ref 70–99)

## 2021-01-30 LAB — BASIC METABOLIC PANEL
Anion gap: 7 (ref 5–15)
BUN: 10 mg/dL (ref 6–20)
CO2: 28 mmol/L (ref 22–32)
Calcium: 8.7 mg/dL — ABNORMAL LOW (ref 8.9–10.3)
Chloride: 99 mmol/L (ref 98–111)
Creatinine, Ser: 0.82 mg/dL (ref 0.61–1.24)
GFR, Estimated: >60 mL/min (ref >60)
Glucose, Bld: 141 mg/dL — ABNORMAL HIGH (ref 70–99)
Potassium: 4.1 mmol/L (ref 3.5–5.1)
Sodium: 134 mmol/L — ABNORMAL LOW (ref 135–145)

## 2021-01-30 LAB — SARS CORONAVIRUS 2 (TAT 6-24 HRS): SARS Coronavirus 2: NEGATIVE

## 2021-01-30 MED ORDER — KETOROLAC TROMETHAMINE 15 MG/ML IJ SOLN: 15.0000 mg | Freq: Once | INTRAMUSCULAR | Status: DC

## 2021-01-30 MED ORDER — SODIUM CHLORIDE 0.9 % IV SOLN: 12.5000 mg | Freq: Once | INTRAVENOUS | Status: DC

## 2021-01-30 MED ORDER — COVID-19 MRNA VACC (MODERNA) 50 MCG/0.25ML IM SUSP
0.2500 mL | Freq: Once | INTRAMUSCULAR | Status: AC
  Administered 2021-01-30: 0.25 mL via INTRAMUSCULAR
  Filled 2021-01-30: qty 0.25

## 2021-01-30 MED ORDER — SODIUM CHLORIDE 0.9 % IV SOLN
12.5000 mg | Freq: Once | INTRAVENOUS | Status: AC
  Administered 2021-01-30: 12.5 mg via INTRAVENOUS
  Filled 2021-01-30: qty 0.5

## 2021-01-30 NOTE — Progress Notes (Addendum)
Subjective:  ON: HA treated with Fioricet.   This am: Patient concerned about persistent HA this morning. He continues to have subjective weakness in his right leg. Otherwise patient was updated on the findings of the MRI and plan for a home or SNF discharge. He reports he would prefer a SNF placement as he does not have much support at home. He also shared how his [employer] got hacked and is worried about how he will make his rent this month. He was seen by case management this afternoon.   Spoke with his significant other, Velna Hatchet, this afternoon about support at home. She stated that she lives in [facility] and is not able to provide much support to Luis [name]. He normally has been able to complete his ADLs on his own but has been having trouble in the last couple of weeks. She states that she would prefer for either a SNF or home PT for him. She also thinks that he could benefit from outpatient therapy for his drug use history. She thinks that he benefited from his last rehabilitation stay.  Objective: Physical exam: General: awake and lying in bed.  Neuro: Alert and oriented. 5/5 strength in bilateral arms. Able to lift left leg of the bed and sustain with resistance. Able to raise right leg off bed but give out with resistance. Intact plantarflexion and dorsiflexion.   Vital signs in last 24 hours: Vitals:   01/30/21 0351 01/30/21 0913 01/30/21 1127 01/30/21 1206  BP: 125/71 138/66  130/66  Pulse: 60 60 70 64  Resp: 18 16  18   Temp: 97.6 F (36.4 C) 98 F (36.7 C)  98.3 F (36.8 C)  TempSrc: Oral     SpO2: 99% 95%  97%   Weight change:   Intake/Output Summary (Last 24 hours) at 01/30/2021 1213 Last data filed at 01/30/2021 0454 Gross per 24 hour  Intake --  Output 1000 ml  Net -1000 ml   CMP Latest Ref Rng & Units 01/30/2021 01/29/2021 01/28/2021  Glucose 70 - 99 mg/dL 03/30/2021) 540(J) 811(B)  BUN 6 - 20 mg/dL 10 12 9   Creatinine 0.61 - 1.24 mg/dL 147(W 2.95  Sodium 135 - 145  mmol/L 134(L) 136 135  Potassium 3.5 - 5.1 mmol/L 4.1 4.1 4.6  Chloride 98 - 111 mmol/L 99 102 103  CO2 22 - 32 mmol/L 28 26 25   Calcium 8.9 - 10.3 mg/dL 6.21) 3.08) )  Total Protein 6.5 - 8.1 g/dL - - 5.1(L)  Total Bilirubin 0.3 - 1.2 mg/dL - - 1.0  Alkaline Phos 38 - 126 U/L - - 36(L)  AST 15 - 41 U/L - - 30  ALT 0 - 44 U/L - - 16   CBC Latest Ref Rng & Units 01/30/2021 01/28/2021 01/27/2021  WBC 4.0 - 10.5 K/uL 8.6 7.6 9.7  Hemoglobin 13.0 - 17.0 g/dL 04/01/2021 03/30/2021 03/29/2021  Hematocrit 39.0 - 52.0 % 42.6 40.9 45.3  Platelets 150 - 400 K/uL 218 226 193   MRI angio head and brain (8/3):  IMPRESSION: MRI: Motion limited study without evidence of acute intracranial abnormality. Diffusion weighted imaging is of good quality and there is no evidence of acute infarct.   MRA: 1. Motion limited study without evidence of a large vessel occlusion or proximal high-grade stenosis. 2. 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 3. Similar additional 1-2 mm left paraclinoid ICA aneurysm versus infundibulum   Assessment/Plan:  Principal Problem:   Right arm weakness Active Problems:  Chronic systolic CHF (congestive heart failure) (HCC)   Pacemaker   Diabetes mellitus without complication (HCC)   Hypertension   Cocaine use disorder (HCC)   Right sided weakness  [name] is a 50yo M with pmhx of T2MD, chronic HFrEF s/p pacemaker, hypertension, hyperlipidemia, hx of cocaine abuse and mood disorders here with right leg paresis and decreased sensation concerning for functional neurologic disorder vs complex migraine.   Right leg weakness Intractable headache Pt here with right leg paresis, and decreased sensation. CT head and MRI angio has shown no acute intracranial changes. Pt continues to have subjective weakness of leg. Swallow study found oropharyngeal function that is within normal function limits. Pt has been treated 3 times with Fioricet and continues to have headaches.  He endorses no n/v or vision change. Treated with IV phenergan this afternoon. Presentation concerning for hemiplegic migraine vs functional neurologic disorder. Pt agreeable to SNF or HHPT but would prefer SNF.  -IV phenergan for headache -continue clopidogrel 75mg  daily and rosuvastatin 20mg  daily -PT/OT and encourage OOB and in chair -outpatient: ambulatory referral to neurology for stroke f/u  T2DM:  Hemoglobin A1c on 8/2 was 8.8. Last glucose of 131 on. Holding home Victoza. -SSI -continue 70/30 insulin 40 units twice daily -Resume Victoza after discharge, could consider starting an SGLT2 inhibitor as an outpatient.   Chronic HFrEF (LVEF 30-35%) Pt presenting w/o symptoms of CHF exacerbation, appears euvolemic on exam.  At home patient is on a beta-blocker, statin, and diabetes medication. -continue home lasix 80mg  daily -continue home metoprolol-succinate 100 mg daily -continue home rosuvastatin 20mg  daily     HTN: SBP ranging from 125-146 in last 24 hours. -start home metoprolol-succinate 100 mg daily and lasix 80 mg daily    HLD: LDL of 71 on recent lipid panel. LDL goal of <70.  -cont home rosuvastatin 20mg  daily   Mood disorder: Hx of cocaine induced mood disorder and PTSD.  -cont home Depakote 500 mg twice daily -cont home Trazodone 200 mg daily at bedtime -cont home Sertraline 150 mg daily   Diet: heart healthy  VTE ppx: enoxaparin 40 mg daily  Code status: full   Prior to Admission Living Arrangement: Home Anticipated Discharge Location: Home Barriers to Discharge: Clinical improvement   Dispo: Anticipated discharge in 2/3 days depending on findings of MRI.    LOS: 2 days   T, Medical Student 01/30/2021, 12:13 PM

## 2021-01-30 NOTE — Progress Notes (Signed)
Physical Therapy Treatment Patient Details Name: Luis [name] MRN: [id number] DOB: 10-10-1970 Today's Date: 01/30/2021    History of Present Illness 50 y/o male presented to ED on 8/1 for dizziness, leg pain x 4 days, headache, R sided numbness/weakness. CT head negative for acute abnormalities. MRI pending. PMH of anxiety, biventricular ICD, chronic systolic CHF, cocaine use, DM, HTN, morbid obesity , substance abuse. OSA, PTSD, Refusal of blood transfusions as patient is Jehovah's Witness, and Tobacco abuse.    PT Comments    Patient requires max encouragement to participate with therapy. Patient requires min guard for safety with mobilization. Patient ambulated to bathroom with RW but set RW to the side and ambulated into bathroom with no AD and not reaching for objects. Patient stood at toilet with no UE support with no sway or LOB noted. Patient performed sit to stand x 5 with min guard for safety. Continue to recommend SNF for ongoing Physical Therapy.       Follow Up Recommendations  SNF     Equipment Recommendations  None recommended by PT    Recommendations for Other Services Other (comment) (Psych Consult)     Precautions / Restrictions Precautions Precautions: Fall Restrictions Weight Bearing Restrictions: No    Mobility  Bed Mobility Overal bed mobility: Needs Assistance Bed Mobility: Supine to Sit;Sit to Supine     Supine to sit: Supervision Sit to supine: Supervision   General bed mobility comments: While therapist back was turned, patient advanced R LE without assistance from L LE but with acknowledgement from therapist, patient using L LE to assist R LE to EOB. Patient able to slide self up in bed with use of B LE.    Transfers Overall transfer level: Needs assistance Equipment used: Rolling Hazelgrace Bonham (2 wheeled) Transfers: Sit to/from Stand Sit to Stand: Min guard         General transfer comment: min guard for safety. Repeated sit to stand x 5  from low bed surface. Pulling up on RW with R UE  Ambulation/Gait Ambulation/Gait assistance: Min guard Gait Distance (Feet): 20 Feet Assistive device: Rolling Jahkeem Kurka (2 wheeled);None Gait Pattern/deviations: Decreased stride length;Step-to pattern;Decreased stance time - right;Decreased step length - right;Decreased weight shift to right;Wide base of support Gait velocity: decreased   General Gait Details: Ambulated to bathroom with Rw. Patient set RW to the side and ambulated 4' into bathroom without reaching for objects to assist. Patient able to stand at toilet and urinate with no UE support and no sway noted in standing. Patient also standing with no UE support to perform hand hygiene with no sway or LOB noted. Min guard throughout for safety.   Stairs             Wheelchair Mobility    Modified Rankin (Stroke Patients Only)       Balance Overall balance assessment: Needs assistance;History of Falls Sitting-balance support: No upper extremity supported;Feet supported Sitting balance-Leahy Scale: Good     Standing balance support: No upper extremity supported Standing balance-Leahy Scale: Fair Standing balance comment: able to stand and urinate with no UE support. Able to perform hand hygiene with no UE support. Min guard for safety                            Cognition Arousal/Alertness: Awake/alert Behavior During Therapy: Flat affect Overall Cognitive Status: No family/caregiver present to determine baseline cognitive functioning  General Comments: Following all commands. Not willing to participate but with max encouragement, patient willing to ambulate to bathroom      Exercises      General Comments        Pertinent Vitals/Pain Pain Assessment: Faces Faces Pain Scale: Hurts little more Pain Location: Headache Pain Descriptors / Indicators: Headache Pain Intervention(s): Monitored during session     Home Living                      Prior Function            PT Goals (current goals can now be found in the care plan section) Acute Rehab PT Goals Patient Stated Goal: to get back to normal PT Goal Formulation: With patient Time For Goal Achievement: 02/12/21 Potential to Achieve Goals: Good Progress towards PT goals: Progressing toward goals    Frequency    Min 3X/week      PT Plan Current plan remains appropriate    Co-evaluation              AM-PAC PT "6 Clicks" Mobility   Outcome Measure  Help needed turning from your back to your side while in a flat bed without using bedrails?: A Little Help needed moving from lying on your back to sitting on the side of a flat bed without using bedrails?: A Little Help needed moving to and from a bed to a chair (including a wheelchair)?: A Little Help needed standing up from a chair using your arms (e.g., wheelchair or bedside chair)?: A Little Help needed to walk in hospital room?: A Little Help needed climbing 3-5 steps with a railing? : A Little 6 Click Score: 18    End of Session Equipment Utilized During Treatment: Gait belt Activity Tolerance: Patient tolerated treatment well Patient left: in bed;with call bell/phone within reach;with bed alarm set Nurse Communication: Mobility status PT Visit Diagnosis: Unsteadiness on feet (R26.81);Muscle weakness (generalized) (M62.81);Difficulty in walking, not elsewhere classified (R26.2);Other symptoms and signs involving the nervous system (R29.898)     Time: 1610-9604 PT Time Calculation (min) (ACUTE ONLY): 17 min  Charges:  $Gait Training: 8-22 mins                     Akira Perusse A. Dan Humphreys PT, DPT Acute Rehabilitation Services Pager [phone] Office [phone]    Viviann Spare 01/30/2021, 3:32 PM

## 2021-01-30 NOTE — TOC Progression Note (Signed)
Transition of Care [facility]) - Progression Note    Patient Details  Name: Luis [name] MRN: [id number] Date of Birth: [birth year]  [facility]) CM/SW Contact  Kermit Balo, RN Phone Number: 01/30/2021, 2:16 PM  Clinical Narrative:    Pt provided bed offers. He selected [facility]. [facility] will have a bed tomorrow. Covid test ordered. Pt is willing to receive covid booster. MD updated.  Insurance started for SNF rehab.  TOC following.   Expected Discharge Plan: Skilled Nursing Facility Barriers to Discharge: Continued Medical Work up  Expected Discharge Plan and Services Expected Discharge Plan: Skilled Nursing Facility   Discharge Planning Services: CM Consult Post Acute Care Choice: Skilled Nursing Facility Living arrangements for the past 2 months: Single Family Home                                       Social Determinants of Health (SDOH) Interventions    Readmission Risk Interventions No flowsheet data found.

## 2021-01-30 NOTE — Progress Notes (Signed)
Patient placed self on CPAP.  RT assistance not needed at this time.  RT will continue to monitor.

## 2021-01-30 NOTE — Progress Notes (Signed)
STROKE TEAM PROGRESS NOTE    Interval History   No acute events overnight, patient is resting in the bed and continues to use the right upper extremity well but still complains of right leg weakness.  MRI scan of the brain done yesterday is incomplete study due to postrotation movement but diffusion-weighted imaging is clearly negative for acute infarct..  MR angiogram of the brain is also motion limited but no evidence of large vessel occlusion or high-grade stenosis.  Stable appearance of 2 mm left paraclinoid ICA and 1 to 2 mm left paraclinoid ICA aneurysm versus infundibulum.   Pertinent Lab Work and Imaging    01/27/21 CT Head WO IV Contrast NAICP   01/28/21 CT Head WO Contrast  NAICP   01/28/21 Echocardiogram   1. No LV thrombus is seen. There is marked septal-lateral wall left ventricular dyssynchrony. Left ventricular ejection fraction, by estimation, is 30 to 35%. The left ventricle has moderately decreased function. The left ventricle demonstrates global hypokinesis. Left ventricular diastolic parameters are consistent with Grade II diastolic dysfunction (pseudonormalization). Elevated left atrial pressure.   2. Right ventricular systolic function is normal. The right ventricular size is normal. Tricuspid regurgitation signal is inadequate for assessing PA pressure.   3. Left atrial size was moderately dilated.   4. The mitral valve is normal in structure. Mild mitral valve regurgitation. No evidence of mitral stenosis.   5. The aortic valve is normal in structure. Aortic valve regurgitation is not visualized. No aortic stenosis is present.   6. The inferior vena cava is dilated in size with <50% respiratory variability, suggesting right atrial pressure of 15 mmHg.  MRI and MRA suboptimal due to motion artifact but DWI images negative for acute stroke.  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  Physical Examination   Constitutional: Resting in bed in NAD   Cardiovascular: Normal RR Respiratory: No increased WOB   Mental status: AAOx4 Speech: Fluent, repetition and naming intact  Cranial nerves: EOMI, VFF, Face symmetric, Tongue midline, Shoulder shrug intact  Motor: Normal bulk and tone. Drifts to RUE and RLE. + Hoover test positive.  He has notable giveway weakness when assess strength to the right arm and right leg.  Sensory: States that vibration to face is decreased to the right. Sensation to vibration reduced to the right leg, right arm..  He splits vibration sensation over forehead. Coordination: FNF intact  Gait: Deferred  Assessment and Plan   [name] is a 50 y.o. male w/pmh of CHF, cocaine use, diabetes, hypertension, hyperlipidemia who presents with right-sided numbness and weakness. He was outside the window for IVTPA and thrombectomy.   #Right Sided Weakness Non organic pattern Patient presented with the symptoms described above. At this time, his stroke work up is ongoing. He has had two CTH done on 8/1 and 8/2 both are negative. Vessel imaging is pending with MRA Head and Neck along with MRI Brain. Echo w/EF 30 to 35 %, left atrium moderately dilated. Stroke labs were completed including Lipid panel w/LDL 71 and Hemoglobin A1C 8.8. UDS positive for cocaine. His neurological examination is pertinent for functional findings; give way weakness, positive Hoover test and vibration decreased to the frontal bone on the right side. Given stroke risk factors of diabetes, hypertension, hyperlipidemia will proceed with stroke work up to rule out a stroke. - Continue Plavix 75 mg QD and Crestor 20 mg for stroke prevention  -At discharge please place ambulatory referral to neurology for  stroke follow up   #Hypertension #HF  He has a history of HTN, HF and takes Furosemide, Toprolol, Victoza at home. Currently blood pressure is trending 110-150. No need for permissive HTN given sx started 5 days ago.   #Hyperlipidemia From  a stroke prevention stand point, the LDL goal is < 70. LDL is 71, close to goal. Continue Crestor.   #DMII  Hemoglobin A1C this admission noted to be 8.8, not at goal from a stroke standpoint. Recommend SSI while hospitalized.   Hospital day # 2   Patient continues to have subjective right-sided weakness with nonorganic pattern with giveaway weakness and poor effort.  CT scan and MRI scan of the brain are unremarkable.  He had similar presentation in April with essentially negative work-up.   Encourage mobilize out of bed and ongoing therapies.  No further stroke work-up is necessary.  Stroke team will sign off.  Kindly call for questions.  Greater than 50% time during this 25-minute visit was spent in counseling and coordination of care and discussion with care team.  Patient encouraged to to quit cocaine abuse which could be contributing.  Delia Heady, MD Medical Director [facility] Pager: [phone] 01/30/2021 12:49 PM   To contact Stroke Continuity provider, please refer to [email or web address]. After hours, contact General Neurology

## 2021-01-30 NOTE — NC FL2 (Signed)
[place] MEDICAID FL2 LEVEL OF CARE SCREENING TOOL     IDENTIFICATION  Patient Name: Luis [name] Birthdate: 07-May-1971 Sex: male Admission Date (Current Location): 01/27/2021  [facility] and [place] Number:  Producer, television/film/video and Address:  [facility] [place], [address] [place], [place] [address]      Provider Number: [id number]  Attending Physician Name and Address:  Tyson Alias, *  Relative Name and Phone Number:       Current Level of Care: Hospital Recommended Level of Care: Skilled Nursing Facility Prior Approval Number:    Date Approved/Denied:   PASRR Number: Under review  Discharge Plan: SNF    Current Diagnoses: Patient Active Problem List   Diagnosis Date Noted   Right arm weakness 01/28/2021   Right sided weakness 01/28/2021   Nonischemic cardiomyopathy (HCC)    Morbid obesity (HCC) 05/05/2019   Cocaine use disorder (HCC)    Chronic systolic CHF (congestive heart failure) (HCC) 01/17/2019   Pacemaker    HLD (hyperlipidemia)    Diabetes mellitus without complication (HCC)    Hypertension    GERD (gastroesophageal reflux disease)    Tobacco abuse     Orientation RESPIRATION BLADDER Height & Weight     Self, Time, Situation, Place  Normal Continent Weight:   Height:     BEHAVIORAL SYMPTOMS/MOOD NEUROLOGICAL BOWEL NUTRITION STATUS      Continent Diet (Carbmod)  AMBULATORY STATUS COMMUNICATION OF NEEDS Skin   Limited Assist Verbally Normal                       Personal Care Assistance Level of Assistance  Bathing, Dressing, Feeding Bathing Assistance: Limited assistance Feeding assistance: Independent Dressing Assistance: Limited assistance     Functional Limitations Info  Sight, Hearing, Speech Sight Info: Adequate Hearing Info: Adequate Speech Info: Adequate    SPECIAL CARE FACTORS FREQUENCY  PT (By licensed PT), OT (By licensed OT)     PT Frequency: 5x/week OT Frequency: 5x/week             Contractures Contractures Info: Not present    Additional Factors Info  Code Status, Allergies, Psychotropic, Insulin Sliding Scale Code Status Info: Full Allergies Info: Aspirin, Iodine-131, Iodinated agent, latex, Penicillin, Tramadol, Amoxicillin-Pot Clavulanate, Lidocaine, Lisinopril. Psychotropic Info: Depakote, Zoloft, Trazadone Insulin Sliding Scale Info: Novolog 0-15 Units 3 times daily with meals, Levemir 20 Units twice a day       Current Medications (01/30/2021):  This is the current hospital active medication list Current Facility-Administered Medications  Medication Dose Route Frequency Provider Last Rate Last Admin   acetaminophen (TYLENOL) tablet 650 mg  650 mg Oral Q6H PRN Quincy Simmonds, MD   650 mg at 01/29/21 5784   Or   acetaminophen (TYLENOL) suppository 650 mg  650 mg Rectal Q6H PRN Quincy Simmonds, MD       clopidogrel (PLAVIX) tablet 75 mg  75 mg Oral Daily Quincy Simmonds, MD   75 mg at 01/30/21 1127   divalproex (DEPAKOTE) DR tablet 500 mg  500 mg Oral Q12H Quincy Simmonds, MD   500 mg at 01/30/21 1127   enoxaparin (LOVENOX) injection 40 mg  40 mg Subcutaneous Daily Quincy Simmonds, MD   40 mg at 01/30/21 1127   furosemide (LASIX) tablet 80 mg  80 mg Oral Daily Evlyn Kanner, MD   80 mg at 01/30/21 1126   insulin aspart (novoLOG) injection 0-15 Units  0-15 Units Subcutaneous TID WC Elaina Pattee,  Shanda Bumps, MD   2 Units at 01/30/21 1231   insulin aspart (novoLOG) injection 10 Units  10 Units Subcutaneous TID WC Aslam, Leanna Sato, MD   10 Units at 01/30/21 1232   insulin detemir (LEVEMIR) injection 20 Units  20 Units Subcutaneous BID Aslam, Leanna Sato, MD   20 Units at 01/30/21 1133   metoprolol succinate (TOPROL-XL) 24 hr tablet 100 mg  100 mg Oral Daily Evlyn Kanner, MD   100 mg at 01/30/21 1127   pantoprazole (PROTONIX) EC tablet 40 mg  40 mg Oral Daily Quincy Simmonds, MD   40 mg at 01/30/21 1126   promethazine (PHENERGAN) 12.5 mg in sodium chloride 0.9 % 50 mL IVPB   12.5 mg Intravenous Once Aslam, Sadia, MD 200 mL/hr at 01/30/21 1245 12.5 mg at 01/30/21 1245   rosuvastatin (CRESTOR) tablet 20 mg  20 mg Oral QPM Quincy Simmonds, MD   20 mg at 01/29/21 1818   sertraline (ZOLOFT) tablet 150 mg  150 mg Oral Daily Quincy Simmonds, MD   150 mg at 01/30/21 1126   traZODone (DESYREL) tablet 200 mg  200 mg Oral QHS Quincy Simmonds, MD   200 mg at 01/29/21 2149     Discharge Medications: Please see discharge summary for a list of discharge medications.  Relevant Imaging Results:  Relevant Lab Results:   Additional Information SSN#- [id number]  Kermit Balo, RN

## 2021-01-31 DIAGNOSIS — R29898 Other symptoms and signs involving the musculoskeletal system: Secondary | ICD-10-CM | POA: Diagnosis not present

## 2021-01-31 LAB — BASIC METABOLIC PANEL
Anion gap: 10 (ref 5–15)
BUN: 15 mg/dL (ref 6–20)
CO2: 29 mmol/L (ref 22–32)
Calcium: 9 mg/dL (ref 8.9–10.3)
Chloride: 97 mmol/L — ABNORMAL LOW (ref 98–111)
Creatinine, Ser: 0.87 mg/dL (ref 0.61–1.24)
GFR, Estimated: >60 mL/min (ref >60)
Glucose, Bld: 164 mg/dL — ABNORMAL HIGH (ref 70–99)
Potassium: 4.4 mmol/L (ref 3.5–5.1)
Sodium: 136 mmol/L (ref 135–145)

## 2021-01-31 LAB — GLUCOSE, CAPILLARY
Glucose-Capillary: 167 mg/dL — ABNORMAL HIGH (ref 70–99)
Glucose-Capillary: 176 mg/dL — ABNORMAL HIGH (ref 70–99)
Glucose-Capillary: 149 mg/dL — ABNORMAL HIGH (ref 70–99)

## 2021-01-31 LAB — CBC
HCT: 44.9 % (ref 39.0–52.0)
Hemoglobin: 14.7 g/dL (ref 13.0–17.0)
MCH: 29 pg (ref 26.0–34.0)
MCHC: 32.7 g/dL (ref 30.0–36.0)
MCV: 88.6 fL (ref 80.0–100.0)
Platelets: 236 10*3/uL (ref 150–400)
RBC: 5.07 MIL/uL (ref 4.22–5.81)
RDW: 12.8 % (ref 11.5–15.5)
WBC: 10.6 10*3/uL — ABNORMAL HIGH (ref 4.0–10.5)
nRBC: 0 % (ref 0.0–0.2)

## 2021-01-31 MED ORDER — PROMETHAZINE HCL 12.5 MG PO TABS: 12.5000 mg | ORAL_TABLET | Freq: Three times a day (TID) | ORAL | 0 refills | Status: DC | PRN

## 2021-01-31 MED ORDER — SODIUM CHLORIDE 0.9 % IV SOLN
12.5000 mg | Freq: Once | INTRAVENOUS | Status: AC
  Administered 2021-01-31: 12.5 mg via INTRAVENOUS
  Filled 2021-01-31: qty 0.5

## 2021-01-31 NOTE — TOC Transition Note (Signed)
Transition of Care [facility]) - CM/SW Discharge Note   Patient Details  Name: Luis [name] MRN: [id number] Date of Birth: 03/26/1971  [facility] CM/SW Contact:  Tom-Johnson, Hershal Coria, RN Phone Number: 01/31/2021, 12:29 PM   Clinical Narrative:    Patient will be discharging to [facility]. [facility] to transport. Bedside RN updated. RN please call [phone], Room 120 B  Final next level of care: Skilled Nursing Facility Barriers to Discharge: Barriers Resolved   Patient Goals and CMS Choice Patient states their goals for this hospitalization and ongoing recovery are:: Get stronger and go home after rehab. CMS Medicare.gov Compare Post Acute Care list provided to:: Patient Choice offered to / list presented to : Patient  Discharge Placement PASRR number recieved: 01/30/21 Existing PASRR number confirmed : 01/30/21          Patient chooses bed at: [facility] Patient to be transferred to facility by: [facility] Name of family member notified: Patient refused    Discharge Plan and Services   Discharge Planning Services: CM Consult Post Acute Care Choice: Skilled Nursing Facility                               Social Determinants of Health (SDOH) Interventions     Readmission Risk Interventions Readmission Risk Prevention Plan 01/31/2021 01/31/2021  Transportation Screening Complete -  PCP or Specialist Appt within 3-5 Days Complete Complete  HRI or Home Care Consult Complete Complete  Social Work Consult for Recovery Care Planning/Counseling Patient refused Complete  Palliative Care Screening Not Applicable Not Applicable  Medication Review [employer]) Complete Referral to Pharmacy

## 2021-01-31 NOTE — Care Management Important Message (Signed)
Important Message  Patient Details  Name: Luis [name] MRN: [id number] Date of Birth: 08/15/1970   Medicare Important Message Given:  Yes     [name] 01/31/2021, 2:36 PM

## 2021-01-31 NOTE — Progress Notes (Addendum)
Pt discharged via [facility] at this time.  He has all belongings with him, and Sharin Mons has packet for SNF.  Attempted to call report to [facility] with no answer.  Will try to call again.  [phone]: Report called to [place] at [facility]

## 2021-01-31 NOTE — Discharge Instructions (Addendum)
[name], Luis [name] were recently admitted to [facility] for weakness of your right leg. We did a work up for the weakness by getting imaging of your brain which showed no signs of stroke. This finding was very reassuring that your weakness was not related to a structural abnormality in your brain. We think you could do well from getting rehab at [facility]. We encourage you to continue taking your current medications as prescribed and following with your primary care provider. Please ensure you have an appointment with your provider after your hospital discharge.   For your headache, we are prescribing you an oral medication called phenergan. You can take it every 6 hours as needed for your headaches. It helped relief your headache while you were in the hospital and we think it will be helpful for you at rehab and at home as well.   We wish you the best of luck on your recovery!

## 2021-01-31 NOTE — Discharge Summary (Signed)
Name: Luis [name] MRN: [id number] DOB: Jul 14, 1970 50 y.o. PCP: Margot Ables, MD  Date of Admission: 01/27/2021  4:18 PM Date of Discharge: 01/31/2021 Attending Physician: Tyson Alias, *  Discharge Diagnosis: 1. Right sided weakness 2/2 functional neurologic disorder 2. Intractable headache  3. Diabetes mellitus 4. Hypertension 5. Hyperlipidemia 6. Mood disorder  Discharge Medications: Allergies as of 01/31/2021       Reactions   Aspirin Anaphylaxis   Throat closing   Iodine-131 Hives   Hives   Iodinated Diagnostic Agents Hives   Latex Hives, Rash   Penicillins Nausea And Vomiting   Tramadol Hives, Nausea Only   Amoxicillin-pot Clavulanate Diarrhea   Lidocaine Rash   Lisinopril Cough        Medication List     STOP taking these medications    aspirin EC 81 MG tablet       TAKE these medications    clopidogrel 75 MG tablet Commonly known as: PLAVIX Take 75 mg by mouth daily.   divalproex 500 MG DR tablet Commonly known as: DEPAKOTE Take 500 mg by mouth 2 (two) times a day.   furosemide 80 MG tablet Commonly known as: LASIX Take 80 mg by mouth daily.   HumaLOG Mix 75/25 KwikPen (75-25) 100 UNIT/ML Kwikpen Generic drug: Insulin Lispro Prot & Lispro Inject 20 Units into the skin 2 (two) times daily before a meal. What changed:  how much to take when to take this   metoprolol succinate 100 MG 24 hr tablet Commonly known as: TOPROL-XL Take 100 mg by mouth at bedtime. Take with or immediately following a meal.   multivitamin with minerals Tabs tablet Take 1 tablet by mouth daily.   pantoprazole 40 MG tablet Commonly known as: PROTONIX Take 40 mg by mouth daily.   prazosin 2 MG capsule Commonly known as: MINIPRESS Take 2 mg by mouth at bedtime.   promethazine 12.5 MG tablet Commonly known as: PHENERGAN Take 1 tablet (12.5 mg total) by mouth every 8 (eight) hours as needed (for headache/nausea/vomiting/dizziness).    rosuvastatin 20 MG tablet Commonly known as: CRESTOR Take 20 mg by mouth every evening.   sertraline 50 MG tablet Commonly known as: ZOLOFT Take 150 mg by mouth daily.   traZODone 100 MG tablet Commonly known as: DESYREL Take 200 mg by mouth at bedtime.   Victoza 18 MG/3ML Sopn Generic drug: liraglutide Inject 1.8 mg into the skin daily.        Disposition and follow-up:   Luis [name] was discharged from [facility] in Stable condition.  At the hospital follow up visit please address:  1.  Please ensure Luis [name] is improving with his right sided weakness. Also can work with him further to better manage his headaches.  [ ]  ambulatory neurology referral for stroke workup [ ]  ambulatory psych/substance use referral  [ ]  consider starting an SGLT2 inhibitor for his heart failure   2.  Labs / imaging needed at time of follow-up: none  3.  Pending labs/ test needing follow-up: none  Follow-up Appointments:  Follow-up Information     , MD. Schedule an appointment as soon as possible for a visit in 1 week(s).   Specialty: Family Medicine Contact information: [address] [place] Margot Ables [address] [phone]         [facility]. Schedule an appointment as soon as possible for a visit in 3 week(s).   Contact information: [address] [place] [address]  [address] [place] [address] [phone]                Hospital Course by problem list: 1. 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 He was admitted for evaluation of paresis and work up for stroke. On presentation, pt also endorsed vertigo, nausea, decreased  appetite, left sided headache, anxiety and social stressors. Work up of his right sided weakness and paresthesia included CT and MRI angio. CT head on 8/1 showed no acute intracranial changes. CT head and neck angio was unsuccessful due to contrast extravasation injury. MRI angio on 8/3 was motion limited and it showed no acute intracranial abnormalities, no large vessel occlusion or stenosis and a 2mm left paraclinoid ICA aneurysm. Swallow study on 8/4 found oropharyngeal function that is within normal function limits. Persistent headache treated 3 times with Fioricet and later with IV phenergan. For his persistent headache, since he did well with IV phenergan so we discharged to SNF with oral phenergan. Pt was treated with antiplatelets, Plavix 75 mg, and his home statin through out hospitalization. Discharged to [facility] on 01/31/21.    T2DM Pt with last Hemoglobin A1c on 8/2 of 8.8. We held home Victoza and resumed at discharge on 01/31/21.  Chronic HFrEF (LVEF 30-35%) s/p biventricular pacemaker  Was euvolemic throughout hospitalization. Held home lasix and metoprolol given concern of ischemic stoke to allow for permissive hypertension on day one of hospitalization. Pt resumed home metoprolol and lasix on day 2. He was also maintained on home rosuvastatin 20mg  daily.   HTN Held home metoprolol and lasix on day 1 but resumed on day 2.   HLD Lipid panel on 8/1 showed LDL of 71 and HDL of 39. Pt maintained on home rosuvastatin through out his hospitalization.   Mood disorder Pt presented with hx of cocaine induced mood disorder and PTSD. UDS on admission was positive for cocaine. Pt resumed home Depakote 500 mg twice daily, Trazodone 200 mg daily at bedtime and Sertraline 150 mg daily.   Discharge Exam:   BP 120/62 (BP Location: Right Arm)   Pulse 60   Temp 98.3 F (36.8 C)   Resp 18   SpO2 94%  Discharge exam:  Physical Exam  Constitutional: Obese male, well-developed and  well-nourished. No distress.  HENT: Normocephalic and atraumatic, EOMI, conjunctiva normal Cardiovascular: Normal rate, regular rhythm, S1 and S2 present, no murmurs, rubs, gallops.  Distal pulses intact Respiratory: No respiratory distress, no accessory muscle use. Lungs are clear to auscultation bilaterally. On room air  GI: Nondistended, soft, nontender to palpation, active bowel sounds Musculoskeletal: Normal bulk and tone.  No peripheral edema noted. Neurological: Is alert and oriented x4,  Subjective decreased upper extremity strength. Pt able to adjust him self in bed using weak arm. Right lower extremity with inconsistent weakness.  Skin: Warm and dry.  No rash, erythema, lesions noted.  Pertinent Labs, Studies, and Procedures:   CBC Recent Labs    01/30/21 0652  WBC 8.6  HGB 14.1  HCT 42.6  MCV 88.4  PLT 218   Basic Metabolic Panel Recent Labs    82/95/62 0754 01/30/21 0652  NA 136 134*  K 4.1 4.1  CL 102 99  CO2 26 28  GLUCOSE 182*  141*  BUN 12 10  CREATININE 0.76 0.82  CALCIUM 8.7* 8.7*   Liver Function Tests No results for input(s): AST, ALT, ALKPHOS, BILITOT, PROT, ALBUMIN in the last 72 hours. No results for input(s): LIPASE, AMYLASE in the last 72 hours. Cardiac Enzymes No results for input(s): CKTOTAL, CKMB, CKMBINDEX, TROPONINI in the last 72 hours. BNP Invalid input(s): POCBNP D-Dimer No results for input(s): DDIMER in the last 72 hours. Hemoglobin A1C No results for input(s): HGBA1C in the last 72 hours. Fasting Lipid Panel No results for input(s): CHOL, HDL, LDLCALC, TRIG, CHOLHDL, LDLDIRECT in the last 72 hours. Thyroid Function Tests No results for input(s): TSH, T4TOTAL, T3FREE, THYROIDAB in the last 72 hours.  Invalid input(s): FREET3 _____________  CT HEAD WO CONTRAST ( )  Result Date: 01/28/2021 CLINICAL DATA:  50 year old male with history of right arm and leg weakness. EXAM: CT HEAD WITHOUT CONTRAST TECHNIQUE: Contiguous axial images  were obtained from the base of the skull through the vertex without intravenous contrast. COMPARISON:  Head CT 01/27/2021. FINDINGS: Brain: No evidence of acute infarction, hemorrhage, hydrocephalus, extra-axial collection or mass lesion/mass effect. Vascular: No hyperdense vessel or unexpected calcification. Skull: Normal. Negative for fracture or focal lesion. Sinuses/Orbits: No acute finding. Other: None. IMPRESSION: 1. No acute intracranial abnormalities. The appearance of the brain is normal. Electronically Signed   By: Trudie Reed M.D.   On: 01/28/2021 11:42   CT HEAD WO CONTRAST  Result Date: 01/27/2021 CLINICAL DATA:  Neuro deficit, acute, stroke suspected. Dizziness, headache EXAM: CT HEAD WITHOUT CONTRAST TECHNIQUE: Contiguous axial images were obtained from the base of the skull through the vertex without intravenous contrast. COMPARISON:  10/09/2020 FINDINGS: Brain: No evidence of acute infarction, hemorrhage, hydrocephalus, extra-axial collection or mass lesion/mass effect. Vascular: No hyperdense vessel or unexpected calcification. Skull: Normal. Negative for fracture or focal lesion. Sinuses/Orbits: No acute finding. Other: None. IMPRESSION: No acute intracranial findings. Electronically Signed   By: Duanne Guess D.O.   On: 01/27/2021 18:50   MR ANGIO HEAD WO CONTRAST  Result Date: 01/29/2021 CLINICAL DATA:  Neuro deficit, acute, stroke suspected; Stroke EXAM: MRI HEAD WITHOUT CONTRAST MRA HEAD WITHOUT CONTRAST TECHNIQUE: Multiplanar, multi-echo pulse sequences of the brain and surrounding structures were acquired without intravenous contrast. Angiographic images of the Circle of Willis were acquired using MRA technique without intravenous contrast. COMPARISON:  No pertinent prior exam. FINDINGS: MRI HEAD FINDINGS Motion limited evaluation. Within this limitation: Brain: No evidence of acute infarct, hydrocephalus, mass lesion, extra-axial fluid collection or acute hemorrhage. Skull and  upper cervical spine: Normal marrow signal. Sinuses/Orbits: Sinuses are largely clear. No acute orbital findings. Other: No sizable mastoid effusions. MRA HEAD FINDINGS Motion limited evaluation.  Within this limitation: Anterior circulation: Bilateral ICAs, MCAs, and ACAs are patent without evidence of a high-grade stenosis. Similar approximately 2 mm a aneurysm arising from the paraclinoid left ICA (series 5, image 83). Similar additional 1-2 mm inferiorly projecting vascular protrusion arising from the paraclinoid ICA (series 5, image 81), which may represent an aneurysm or infundibulum. Posterior circulation: The visualized intradural vertebral arteries, basilar artery, and posterior cerebral arteries are patent without evidence of a high-grade stenosis. Right larger than left posterior communicating arteries. No aneurysm identified. IMPRESSION: MRI: Motion limited study without evidence of acute intracranial abnormality. Diffusion weighted imaging is of good quality and there is no evidence of acute infarct. MRA: 1. Motion limited study without evidence of a large vessel occlusion or proximal high-grade stenosis. 2. Similar 2 mm left paraclinoid ICA  aneurysm. 3. Similar additional 1-2 mm left paraclinoid ICA aneurysm versus infundibulum. Electronically Signed   By: Feliberto Harts MD   On: 01/29/2021 18:04   MR BRAIN WO CONTRAST  Result Date: 01/29/2021 CLINICAL DATA:  Neuro deficit, acute, stroke suspected; Stroke EXAM: MRI HEAD WITHOUT CONTRAST MRA HEAD WITHOUT CONTRAST TECHNIQUE: Multiplanar, multi-echo pulse sequences of the brain and surrounding structures were acquired without intravenous contrast. Angiographic images of the Circle of Willis were acquired using MRA technique without intravenous contrast. COMPARISON:  No pertinent prior exam. FINDINGS: MRI HEAD FINDINGS Motion limited evaluation. Within this limitation: Brain: No evidence of acute infarct, hydrocephalus, mass lesion, extra-axial fluid  collection or acute hemorrhage. Skull and upper cervical spine: Normal marrow signal. Sinuses/Orbits: Sinuses are largely clear. No acute orbital findings. Other: No sizable mastoid effusions. MRA HEAD FINDINGS Motion limited evaluation.  Within this limitation: Anterior circulation: Bilateral ICAs, MCAs, and ACAs are patent without evidence of a high-grade stenosis. Similar approximately 2 mm a aneurysm arising from the paraclinoid left ICA (series 5, image 83). Similar additional 1-2 mm inferiorly projecting vascular protrusion arising from the paraclinoid ICA (series 5, image 81), which may represent an aneurysm or infundibulum. Posterior circulation: The visualized intradural vertebral arteries, basilar artery, and posterior cerebral arteries are patent without evidence of a high-grade stenosis. Right larger than left posterior communicating arteries. No aneurysm identified. IMPRESSION: MRI: Motion limited study without evidence of acute intracranial abnormality. Diffusion weighted imaging is of good quality and there is no evidence of acute infarct. MRA: 1. Motion limited study without evidence of a large vessel occlusion or proximal high-grade stenosis. 2. 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 3. Similar additional 1-2 mm left paraclinoid ICA aneurysm versus infundibulum. Electronically Signed   By: Feliberto Harts MD   On: 01/29/2021 18:04   ECHOCARDIOGRAM COMPLETE  Result Date: 01/28/2021    ECHOCARDIOGRAM REPORT   Patient Name:   Luis [name] Date of Exam: 01/28/2021 Medical Rec #:  [id number]             Height:       65.0 in Accession #:    [id number]            Weight:       260.0 lb Date of Birth:  Nov 22, 1970              BSA:          2.211 m Patient Age:    50 years              BP:           115/60 mmHg Patient Gender: M                     HR:           66 bpm. Exam Location:  Inpatient Procedure: 2D Echo, Cardiac Doppler and Color Doppler Indications:    Stroke I63.9  History:         Patient has prior history of Echocardiogram examinations, most                 recent 10/01/2020. Cardiomyopathy and CHF, Defibrillator; Risk                 Factors:Hypertension, Diabetes, Dyslipidemia and Current Smoker.  Sonographer:    Renella Cunas RDCS Referring Phys: [id number] [facility] VINCENT IMPRESSIONS  1. No LV thrombus is seen. There is marked septal-lateral wall left ventricular dyssynchrony. Left ventricular  ejection fraction, by estimation, is 30 to 35%. The left ventricle has moderately decreased function. The left ventricle demonstrates global hypokinesis. Left ventricular diastolic parameters are consistent with Grade II diastolic dysfunction (pseudonormalization). Elevated left atrial pressure.  2. Right ventricular systolic function is normal. The right ventricular size is normal. Tricuspid regurgitation signal is inadequate for assessing PA pressure.  3. Left atrial size was moderately dilated.  4. The mitral valve is normal in structure. Mild mitral valve regurgitation. No evidence of mitral stenosis.  5. The aortic valve is normal in structure. Aortic valve regurgitation is not visualized. No aortic stenosis is present.  6. The inferior vena cava is dilated in size with <50% respiratory variability, suggesting right atrial pressure of 15 mmHg. Comparison(s): No significant change from prior study. Prior images reviewed side by side. FINDINGS  Left Ventricle: No LV thrombus is seen. There is marked septal-lateral wall left ventricular dyssynchrony. Left ventricular ejection fraction, by estimation, is 30 to 35%. The left ventricle has moderately decreased function. The left ventricle demonstrates global hypokinesis. The left ventricular internal cavity size was normal in size. There is no left ventricular hypertrophy. Abnormal (paradoxical) septal motion, consistent with left bundle branch block. Left ventricular diastolic parameters  are consistent with Grade II diastolic dysfunction  (pseudonormalization). Elevated left atrial pressure. Right Ventricle: The right ventricular size is normal. No increase in right ventricular wall thickness. Right ventricular systolic function is normal. Tricuspid regurgitation signal is inadequate for assessing PA pressure. Left Atrium: Left atrial size was moderately dilated. Right Atrium: Right atrial size was normal in size. Pericardium: There is no evidence of pericardial effusion. Mitral Valve: The mitral valve is normal in structure. Mild mitral valve regurgitation. No evidence of mitral valve stenosis. Tricuspid Valve: The tricuspid valve is normal in structure. Tricuspid valve regurgitation is not demonstrated. No evidence of tricuspid stenosis. Aortic Valve: The aortic valve is normal in structure. Aortic valve regurgitation is not visualized. No aortic stenosis is present. Pulmonic Valve: The pulmonic valve was normal in structure. Pulmonic valve regurgitation is mild. No evidence of pulmonic stenosis. Aorta: The aortic root and ascending aorta are structurally normal, with no evidence of dilitation. Venous: The inferior vena cava is dilated in size with less than 50% respiratory variability, suggesting right atrial pressure of 15 mmHg. IAS/Shunts: No atrial level shunt detected by color flow Doppler. Additional Comments: A device lead is visualized in the right ventricle.  LEFT VENTRICLE PLAX 2D LVIDd:         5.40 cm      Diastology LVIDs:         5.00 cm      LV e' medial:    7.94 cm/s LV PW:         1.10 cm      LV E/e' medial:  12.6 LV IVS:        1.10 cm      LV e' lateral:   8.49 cm/s LVOT diam:     2.10 cm      LV E/e' lateral: 11.8 LV SV:         72 LV SV Index:   33 LVOT Area:     3.46 cm  LV Volumes (MOD) LV vol d, MOD A2C: 215.0 ml LV vol d, MOD A4C: 259.0 ml LV vol s, MOD A2C: 147.0 ml LV vol s, MOD A4C: 158.0 ml LV SV MOD A2C:     68.0 ml LV SV MOD A4C:     259.0 ml  LV SV MOD BP:      80.3 ml RIGHT VENTRICLE RV S prime:     16.30 cm/s  TAPSE (M-mode): 1.6 cm LEFT ATRIUM             Index       RIGHT ATRIUM           Index LA diam:        4.50 cm 2.03 cm/m  RA Area:     18.20 cm LA Vol (A2C):   47.7 ml 21.57 ml/m RA Volume:   51.80 ml  23.42 ml/m LA Vol (A4C):   49.1 ml 22.20 ml/m LA Biplane Vol: 49.0 ml 22.16 ml/m  AORTIC VALVE LVOT Vmax:   103.00 cm/s LVOT Vmean:  80.400 cm/s LVOT VTI:    0.208 m  AORTA Ao Root diam: 3.00 cm Ao Asc diam:  3.10 cm MITRAL VALVE MV Area (PHT): 3.51 cm     SHUNTS MV Decel Time: 216 msec     Systemic VTI:  0.21 m MV E velocity: 100.00 cm/s  Systemic Diam: 2.10 cm MV A velocity: 77.00 cm/s MV E/A ratio:  1.30 Mihai Croitoru MD Electronically signed by Thurmon Fair MD Signature Date/Time: 01/28/2021/10:19:26 AM    Final    VAS [place] LOWER EXTREMITY VENOUS (DVT) (ONLY [facility] & [facility] 7a-7p)  Result Date: 01/28/2021  Lower Venous DVT Study Patient Name:  Luis [name]  Date of Exam:   01/28/2021 Medical Rec #: [id number]              Accession #:    [id number] Date of Birth: [birth year]               Patient Gender: M Patient Age:   50Y Exam Location:  [facility] Procedure:      VAS [place] LOWER EXTREMITY VENOUS (DVT) Referring Phys: [id number] [facility] R SPONSELLER --------------------------------------------------------------------------------  Indications: Edema.  Limitations: Body habitus and patient position. Comparison Study: No prior study Performing Technologist: Gertie Fey MHA, RDMS, RVT, RDCS  Examination Guidelines: A complete evaluation includes B-mode imaging, spectral Doppler, color Doppler, and power Doppler as needed of all accessible portions of each vessel. Bilateral testing is considered an integral part of a complete examination. Limited examinations for reoccurring indications may be performed as noted. The reflux portion of the exam is performed with the patient in reverse Trendelenburg.  +---------+---------------+---------+-----------+----------+--------------+ RIGHT     CompressibilityPhasicitySpontaneityPropertiesThrombus Aging +---------+---------------+---------+-----------+----------+--------------+ CFV      Full           Yes      Yes                                 +---------+---------------+---------+-----------+----------+--------------+ SFJ      Full                                                        +---------+---------------+---------+-----------+----------+--------------+ FV Prox  Full                                                        +---------+---------------+---------+-----------+----------+--------------+ FV Mid  Full                                                        +---------+---------------+---------+-----------+----------+--------------+ FV DistalFull                                                        +---------+---------------+---------+-----------+----------+--------------+ PFV      Full                                                        +---------+---------------+---------+-----------+----------+--------------+ POP      Full           Yes      Yes                                 +---------+---------------+---------+-----------+----------+--------------+ PTV      Full                                                        +---------+---------------+---------+-----------+----------+--------------+ PERO     Full                                                        +---------+---------------+---------+-----------+----------+--------------+   Left Technical Findings: Not visualized segments include CFV.   Summary: RIGHT: - There is no evidence of deep vein thrombosis in the lower extremity.  - No cystic structure found in the popliteal fossa.   *See table(s) above for measurements and observations. Electronically signed by Waverly Ferrari MD on 01/28/2021 at 5:04:30 PM.    Final     Discharge Instructions: Discharge Instructions     Call MD for:  difficulty  breathing, headache or visual disturbances   Complete by: As directed    Call MD for:  extreme fatigue   Complete by: As directed    Call MD for:  hives   Complete by: As directed    Call MD for:  persistant dizziness or light-headedness   Complete by: As directed    Call MD for:  persistant nausea and vomiting   Complete by: As directed    Call MD for:  severe uncontrolled pain   Complete by: As directed    Diet - low sodium heart healthy   Complete by: As directed    Increase activity slowly   Complete by: As directed       Luis [name], Luis [name] were recently admitted to [facility] for weakness of your right leg. We did a work up for the weakness by getting imaging of your brain which showed no signs of stroke. This  finding was very reassuring that your weakness was not related to a structural abnormality in your brain. We think you could do well from getting rehab at [facility]. We encourage you to continue taking your current medications as prescribed and following with your primary care provider. Please ensure you have an appointment with your provider after your hospital discharge.   For your headache, we are prescribing you an oral medication called phenergan. You can take it every 6 hours as needed for your headaches. It helped relief your headache while you were in the hospital and we think it will be helpful for you at rehab and at home as well.   We wish you the best of luck on your recovery!  Signed:   Attestation for Student Documentation:  I personally was present and performed or re-performed the history, physical exam and medical decision-making activities of this service and have verified that the service and findings are accurately documented in the student's note.  Eliezer Bottom, MD 01/31/2021, 11:40 AM

## 2021-03-06 ENCOUNTER — Inpatient Hospital Stay: Payer: Medicare Other | Admitting: Adult Health

## 2021-03-06 ENCOUNTER — Telehealth: Payer: Self-pay | Admitting: Neurology

## 2021-03-06 NOTE — Progress Notes (Deleted)
[facility] [address] [place]. [place] [address] [phone]       HOSPITAL FOLLOW UP NOTE  [name] Date of Birth:  [birth year] Medical Record Number:  [id number]   Reason for Referral:  hospital stroke follow up    SUBJECTIVE:   CHIEF COMPLAINT:  No chief complaint on file.   HPI:   [name] is a 50 y.o. male w/pmh of CHF, cocaine use, diabetes, hypertension, hyperlipidemia and mood disorder who presented on 01/27/2021 with right-sided numbness and weakness as well as vertigo, nausea, decreased appetite, left-sided headache, anxiety and social stressors.  Personally reviewed hospitalization pertinent progress notes, lab work and imaging. Per note, right-sided weakness with nonorganic pattern with giveaway weakness, positive Hoover test and vibration decreased in the frontal bone on the right side.  Similar presentation in 09/2020 with essentially negative work-up.  MRI brain no acute infarcts.  MRA head/neck unremarkable.  EF 30 to 35%.  LDL 71.  A1c 8.8.  UDS positive for cocaine.  Recommend continuation of Plavix and Crestor.  HTN stable.  Persistent headache treated with Fioricet and IV Phenergan and discharged on oral Phenergan.  Discharged to [facility] health care SNF 8/5.        PERTINENT IMAGING  01/27/21 CT Head WO IV Contrast NAICP    01/28/21 CT Head WO Contrast  NAICP    01/28/21 Echocardiogram   1. No LV thrombus is seen. There is marked septal-lateral wall left ventricular dyssynchrony. Left ventricular ejection fraction, by estimation, is 30 to 35%. The left ventricle has moderately decreased function. The left ventricle demonstrates global hypokinesis. Left ventricular diastolic parameters are consistent with Grade II diastolic dysfunction (pseudonormalization). Elevated left atrial pressure.   2. Right ventricular systolic function is normal. The right ventricular size is normal. Tricuspid regurgitation signal is  inadequate for assessing PA pressure.   3. Left atrial size was moderately dilated.   4. The mitral valve is normal in structure. Mild mitral valve regurgitation. No evidence of mitral stenosis.   5. The aortic valve is normal in structure. Aortic valve regurgitation is not visualized. No aortic stenosis is present.   6. The inferior vena cava is dilated in size with <50% respiratory variability, suggesting right atrial pressure of 15 mmHg.   MRI and MRA suboptimal due to motion artifact but DWI images negative for acute stroke.  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    ROS:   14 system review of systems performed and negative with exception of ***  PMH:  Past Medical History:  Diagnosis Date   Anxiety    Biventricular ICD (implantable cardioverter-defibrillator) in place    Chronic systolic CHF (congestive heart failure) (HCC)    Cocaine use    Diabetes mellitus without complication (HCC)    GERD (gastroesophageal reflux disease)    HLD (hyperlipidemia)    Hypertension    Morbid obesity (HCC)    NICM (nonischemic cardiomyopathy) (HCC)    OSA (obstructive sleep apnea)    PTSD (post-traumatic stress disorder)    on Depakote   Refusal of blood transfusions as patient is Jehovah's Witness    Tobacco abuse     PSH:  Past Surgical History:  Procedure Laterality Date   BIV ICD INSERTION CRT-D     FOOT FRACTURE SURGERY     ROTATOR CUFF REPAIR     TONSILLECTOMY      Social History:  Social History   Socioeconomic History  Marital status: Legally Separated    Spouse name: Not on file   Number of children: Not on file   Years of education: Not on file   Highest education level: Not on file  Occupational History   Not on file  Tobacco Use   Smoking status: Every Day    Types: Cigarettes   Smokeless tobacco: Never   Tobacco comments:    occasionally  Vaping Use   Vaping Use: Never used  Substance and Sexual Activity   Alcohol use: Not  Currently   Drug use: Yes    Types: Cocaine   Sexual activity: Not on file  Other Topics Concern   Not on file  Social History Narrative   Not on file   Social Determinants of Health   Financial Resource Strain: Not on file  Food Insecurity: Not on file  Transportation Needs: Not on file  Physical Activity: Not on file  Stress: Not on file  Social Connections: Not on file  Intimate Partner Violence: Not on file    Family History:  Family History  Problem Relation Age of Onset   Hypertension Mother    Bone cancer Father    Lung cancer Father     Medications:   Current Outpatient Medications on File Prior to Visit  Medication Sig Dispense Refill   clopidogrel (PLAVIX) 75 MG tablet Take 75 mg by mouth daily.     divalproex (DEPAKOTE) 500 MG DR tablet Take 500 mg by mouth 2 (two) times a day.     furosemide (LASIX) 80 MG tablet Take 80 mg by mouth daily.     HUMALOG MIX 75/25 KWIKPEN (75-25) 100 UNIT/ML Kwikpen Inject 20 Units into the skin 2 (two) times daily before a meal. 15 mL 11   metoprolol succinate (TOPROL-XL) 100 MG 24 hr tablet Take 100 mg by mouth at bedtime. Take with or immediately following a meal.     Multiple Vitamin (MULTIVITAMIN WITH MINERALS) TABS tablet Take 1 tablet by mouth daily.     pantoprazole (PROTONIX) 40 MG tablet Take 40 mg by mouth daily.     prazosin (MINIPRESS) 2 MG capsule Take 2 mg by mouth at bedtime.     promethazine (PHENERGAN) 12.5 MG tablet Take 1 tablet (12.5 mg total) by mouth every 8 (eight) hours as needed (for headache/nausea/vomiting/dizziness). 30 tablet 0   rosuvastatin (CRESTOR) 20 MG tablet Take 20 mg by mouth every evening.     sertraline (ZOLOFT) 50 MG tablet Take 150 mg by mouth daily.     traZODone (DESYREL) 100 MG tablet Take 200 mg by mouth at bedtime.      VICTOZA 18 MG/3ML SOPN Inject 1.8 mg into the skin daily. 9 mL 0   No current facility-administered medications on file prior to visit.    Allergies:   Allergies   Allergen Reactions   Aspirin Anaphylaxis    Throat closing    Iodine-131 Hives    Hives   Iodinated Diagnostic Agents Hives   Latex Hives and Rash   Penicillins Nausea And Vomiting   Tramadol Hives and Nausea Only   Amoxicillin-Pot Clavulanate Diarrhea   Lidocaine Rash   Lisinopril Cough      OBJECTIVE:  Physical Exam  There were no vitals filed for this visit. There is no height or weight on file to calculate BMI. No results found.  No flowsheet data found.   General: well developed, well nourished, seated, in no evident distress Head: head normocephalic and atraumatic.  Neck: supple with no carotid or supraclavicular bruits Cardiovascular: regular rate and rhythm, no murmurs Musculoskeletal: no deformity Skin:  no rash/petichiae Vascular:  Normal pulses all extremities   Neurologic Exam Mental Status: Awake and fully alert. Oriented to place and time. Recent and remote memory intact. Attention span, concentration and fund of knowledge appropriate. Mood and affect appropriate.  Cranial Nerves: Fundoscopic exam reveals sharp disc margins. Pupils equal, briskly reactive to light. Extraocular movements full without nystagmus. Visual fields full to confrontation. Hearing intact. Facial sensation intact. Face, tongue, palate moves normally and symmetrically.  Motor: Normal bulk and tone. Normal strength in all tested extremity muscles Sensory.: intact to touch , pinprick , position and vibratory sensation.  Coordination: Rapid alternating movements normal in all extremities. Finger-to-nose and heel-to-shin performed accurately bilaterally. Gait and Station: Arises from chair without difficulty. Stance is normal. Gait demonstrates normal stride length and balance with ***. Tandem walk and heel toe ***.  Reflexes: 1+ and symmetric. Toes downgoing.     NIHSS  *** Modified Rankin  ***      ASSESSMENT: Luis [name] is a 50 y.o. year old male with recent episode  of right-sided numbness and weakness of nonorganic pattern with negative stroke work-up on 01/27/2021 and similar episode in 09/2020.  Vascular risk factors include HTN, HLD, DM, chronic nonischemic heart failure with reduced EF, severe obesity and UDS positive for cocaine.      PLAN:  *** : Residual deficit: ***. Continue {anticoagulants:31417}  and ***  for secondary stroke prevention.  Discussed secondary stroke prevention measures and importance of close PCP follow up for aggressive stroke risk factor management. I have gone over the pathophysiology of stroke, warning signs and symptoms, risk factors and their management in some detail with instructions to go to the closest emergency room for symptoms of concern. HTN: BP goal <130/90.  Stable on *** per PCP HLD: LDL goal <70. Recent LDL 71.  Continue Crestor DMII: A1c goal<7.0. Recent A1c 8.8.     Follow up in *** or call earlier if needed   CC:  [facility] provider: Dr. Pearlean Brownie PCP: Margot Ables, MD    I spent *** minutes of face-to-face and non-face-to-face time with patient.  This included previsit chart review including review of recent hospitalization, lab review, study review, order entry, electronic health record documentation, patient education regarding recent stroke including etiology, secondary stroke prevention measures and importance of managing stroke risk factors, residual deficits and typical recovery time and answered all other questions to patient satisfaction   Ihor Austin, AGNP-BC  [facility] [address] [place], [place] [address]  Phone [phone] Fax [phone] Note: This document was prepared with digital dictation and possible smart phrase technology. Any transcriptional errors that result from this process are unintentional.

## 2021-03-06 NOTE — Telephone Encounter (Signed)
Pt cancelled appt due to not feeling well with a fever of 101 degrees. Rescheduled appt.

## 2021-03-06 NOTE — Telephone Encounter (Signed)
Noted this pt was on the schedule to see Shanda Bumps.

## 2021-04-02 DIAGNOSIS — F1420 Cocaine dependence, uncomplicated: Secondary | ICD-10-CM

## 2021-04-16 ENCOUNTER — Inpatient Hospital Stay: Payer: Medicare Other | Admitting: Adult Health

## 2021-04-16 NOTE — Progress Notes (Deleted)
[facility] [address] [place]. [place] [address] [phone]       HOSPITAL FOLLOW UP NOTE  [name] Date of Birth:  03/04/1971 Medical Record Number:  [id number]   Reason for Referral:  hospital stroke follow up    SUBJECTIVE:   CHIEF COMPLAINT:  No chief complaint on file.   HPI:   [name] is a 50 y.o. male w/pmh of CHF, cardiomyopathy  s/p ICD, OSA on CPAP, cocaine use, diabetes, hypertension, hyperlipidemia who presented on 01/27/2021 with right-sided numbness and weakness. He was outside the window for IVTPA and thrombectomy. CTH 8/1 and 8/2 negative. 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  EF 30 to 35% with left atrium moderately dilated.  LDL 71.  A1c 8.8.  UDS positive for cocaine. Per progress note, exam nonorganic pattern with giveaway weakness, positive Hoover test and decreased vibration to frontal bone on right side.  Recommended Plavix and Crestor for stroke prevention.  Similar presentation noted in 09/2020 with essentially negative work-up.  No further stroke work-up recommended  Today, 04/16/2021, patient being seen for hospital follow-up.  Overall stable from stroke/neurological standpoint without new or reoccurring symptoms.  Remains on Plavix and Crestor without side effects.  Blood pressure today ***.     Readmitted 04/01/2021 to [facility] hospital psychiatric unit with worsening depression and SI w/o plan. History of major depressive disorder and PTSD.  Medication adjustments and discharged home with behavioral health follow-up.      PERTINENT IMAGING    01/27/21 CT Head WO IV Contrast NAICP    01/28/21 CT Head WO Contrast  NAICP    01/28/21 Echocardiogram   1. No LV thrombus is seen. There is marked septal-lateral wall left ventricular dyssynchrony. Left ventricular ejection  fraction, by estimation, is 30 to 35%. The left ventricle has moderately decreased function. The left ventricle demonstrates global hypokinesis. Left ventricular diastolic parameters are consistent with Grade II diastolic dysfunction (pseudonormalization). Elevated left atrial pressure.   2. Right ventricular systolic function is normal. The right ventricular size is normal. Tricuspid regurgitation signal is inadequate for assessing PA pressure.   3. Left atrial size was moderately dilated.   4. The mitral valve is normal in structure. Mild mitral valve regurgitation. No evidence of mitral stenosis.   5. The aortic valve is normal in structure. Aortic valve regurgitation is not visualized. No aortic stenosis is present.   6. The inferior vena cava is dilated in size with <50% respiratory variability, suggesting right atrial pressure of 15 mmHg.   MRI and MRA suboptimal due to motion artifact but DWI images negative for acute stroke.  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    ROS:   14 system review of systems performed and negative with exception of ***  PMH:  Past Medical History:  Diagnosis Date   Anxiety    Biventricular ICD (implantable cardioverter-defibrillator) in place    Chronic systolic CHF (congestive heart failure) (HCC)    Cocaine use    Diabetes mellitus without complication (HCC)    GERD (gastroesophageal reflux disease)    HLD (hyperlipidemia)    Hypertension    Morbid obesity (HCC)    NICM (nonischemic cardiomyopathy) (HCC)    OSA (obstructive sleep apnea)    PTSD (post-traumatic stress disorder)    on Depakote   Refusal of blood  transfusions as patient is Jehovah's Witness    Tobacco abuse     PSH:  Past Surgical History:  Procedure Laterality Date   BIV ICD INSERTION CRT-D     FOOT FRACTURE SURGERY     ROTATOR CUFF REPAIR     TONSILLECTOMY      Social History:  Social History   Socioeconomic History   Marital status: Legally  Separated    Spouse name: Not on file   Number of children: Not on file   Years of education: Not on file   Highest education level: Not on file  Occupational History   Not on file  Tobacco Use   Smoking status: Every Day    Types: Cigarettes   Smokeless tobacco: Never   Tobacco comments:    occasionally  Vaping Use   Vaping Use: Never used  Substance and Sexual Activity   Alcohol use: Not Currently   Drug use: Yes    Types: Cocaine   Sexual activity: Not on file  Other Topics Concern   Not on file  Social History Narrative   Not on file   Social Determinants of Health   Financial Resource Strain: Not on file  Food Insecurity: Not on file  Transportation Needs: Not on file  Physical Activity: Not on file  Stress: Not on file  Social Connections: Not on file  Intimate Partner Violence: Not on file    Family History:  Family History  Problem Relation Age of Onset   Hypertension Mother    Bone cancer Father    Lung cancer Father     Medications:   Current Outpatient Medications on File Prior to Visit  Medication Sig Dispense Refill   clopidogrel (PLAVIX) 75 MG tablet Take 75 mg by mouth daily.     divalproex (DEPAKOTE) 500 MG DR tablet Take 500 mg by mouth 2 (two) times a day.     furosemide (LASIX) 80 MG tablet Take 80 mg by mouth daily.     HUMALOG MIX 75/25 KWIKPEN (75-25) 100 UNIT/ML Kwikpen Inject 20 Units into the skin 2 (two) times daily before a meal. 15 mL 11   metoprolol succinate (TOPROL-XL) 100 MG 24 hr tablet Take 100 mg by mouth at bedtime. Take with or immediately following a meal.     Multiple Vitamin (MULTIVITAMIN WITH MINERALS) TABS tablet Take 1 tablet by mouth daily.     pantoprazole (PROTONIX) 40 MG tablet Take 40 mg by mouth daily.     prazosin (MINIPRESS) 2 MG capsule Take 2 mg by mouth at bedtime.     promethazine (PHENERGAN) 12.5 MG tablet Take 1 tablet (12.5 mg total) by mouth every 8 (eight) hours as needed (for  headache/nausea/vomiting/dizziness). 30 tablet 0   rosuvastatin (CRESTOR) 20 MG tablet Take 20 mg by mouth every evening.     sertraline (ZOLOFT) 50 MG tablet Take 150 mg by mouth daily.     traZODone (DESYREL) 100 MG tablet Take 200 mg by mouth at bedtime.      VICTOZA 18 MG/3ML SOPN Inject 1.8 mg into the skin daily. 9 mL 0   No current facility-administered medications on file prior to visit.    Allergies:   Allergies  Allergen Reactions   Aspirin Anaphylaxis    Throat closing    Iodine-131 Hives    Hives   Iodinated Diagnostic Agents Hives   Latex Hives and Rash   Penicillins Nausea And Vomiting   Tramadol Hives and Nausea Only   Amoxicillin-Pot  Clavulanate Diarrhea   Lidocaine Rash   Lisinopril Cough      OBJECTIVE:  Physical Exam  There were no vitals filed for this visit. There is no height or weight on file to calculate BMI. No results found.  No flowsheet data found.   General: well developed, well nourished, seated, in no evident distress Head: head normocephalic and atraumatic.   Neck: supple with no carotid or supraclavicular bruits Cardiovascular: regular rate and rhythm, no murmurs Musculoskeletal: no deformity Skin:  no rash/petichiae Vascular:  Normal pulses all extremities   Neurologic Exam Mental Status: Awake and fully alert. Oriented to place and time. Recent and remote memory intact. Attention span, concentration and fund of knowledge appropriate. Mood and affect appropriate.  Cranial Nerves: Fundoscopic exam reveals sharp disc margins. Pupils equal, briskly reactive to light. Extraocular movements full without nystagmus. Visual fields full to confrontation. Hearing intact. Facial sensation intact. Face, tongue, palate moves normally and symmetrically.  Motor: Normal bulk and tone. Normal strength in all tested extremity muscles Sensory.: intact to touch , pinprick , position and vibratory sensation.  Coordination: Rapid alternating movements  normal in all extremities. Finger-to-nose and heel-to-shin performed accurately bilaterally. Gait and Station: Arises from chair without difficulty. Stance is normal. Gait demonstrates normal stride length and balance with ***. Tandem walk and heel toe ***.  Reflexes: 1+ and symmetric. Toes downgoing.     NIHSS  *** Modified Rankin  ***      ASSESSMENT: Luis [name] is a 50 y.o. year old male with right-sided weakness with nonorganic pattern on 01/27/2021 and similar presentation on 10/09/2020. Vascular risk factors include HTN, HLD, DM, CHF (UDS positive).      PLAN:  *** : Residual deficit: ***. Continue {anticoagulants:31417}  and ***  for secondary stroke prevention.  Discussed secondary stroke prevention measures and importance of close PCP follow up for aggressive stroke risk factor management. I have gone over the pathophysiology of stroke, warning signs and symptoms, risk factors and their management in some detail with instructions to go to the closest emergency room for symptoms of concern. HTN: BP goal <130/90.  Stable on *** per PCP HLD: LDL goal <70. Recent LDL 71 on Crestor.  DMII: A1c goal<7.0. Recent A1c 8.8.     Follow up in *** or call earlier if needed   CC:  [facility] provider: Dr. Pearlean Brownie PCP: Margot Ables, MD    I spent *** minutes of face-to-face and non-face-to-face time with patient.  This included previsit chart review including review of recent hospitalization, lab review, study review, order entry, electronic health record documentation, patient education regarding recent stroke including etiology, secondary stroke prevention measures and importance of managing stroke risk factors, residual deficits and typical recovery time and answered all other questions to patient satisfaction   Ihor Austin, AGNP-BC  [facility] [address] [place], [place] [address]  Phone [phone] Fax [phone] Note:  This document was prepared with digital dictation and possible smart phrase technology. Any transcriptional errors that result from this process are unintentional.

## 2021-05-05 ENCOUNTER — Ambulatory Visit (HOSPITAL_COMMUNITY): Payer: Medicare Other | Admitting: Student in an Organized Health Care Education/Training Program

## 2021-05-08 ENCOUNTER — Encounter (HOSPITAL_COMMUNITY): Payer: Self-pay | Admitting: Physician Assistant

## 2021-05-08 ENCOUNTER — Other Ambulatory Visit: Payer: Self-pay

## 2021-05-08 ENCOUNTER — Ambulatory Visit (INDEPENDENT_AMBULATORY_CARE_PROVIDER_SITE_OTHER): Payer: Medicare Other | Admitting: Physician Assistant

## 2021-05-08 VITALS — BP 151/75 | HR 111 | Ht 65.0 in | Wt 238.0 lb

## 2021-05-08 DIAGNOSIS — F39 Unspecified mood [affective] disorder: Secondary | ICD-10-CM

## 2021-05-08 DIAGNOSIS — G4700 Insomnia, unspecified: Secondary | ICD-10-CM

## 2021-05-08 DIAGNOSIS — F331 Major depressive disorder, recurrent, moderate: Secondary | ICD-10-CM | POA: Insufficient documentation

## 2021-05-08 DIAGNOSIS — F411 Generalized anxiety disorder: Secondary | ICD-10-CM | POA: Diagnosis not present

## 2021-05-08 MED ORDER — PRAZOSIN HCL 2 MG PO CAPS: 2.0000 mg | ORAL_CAPSULE | Freq: Every day | ORAL | 1 refills | Status: DC

## 2021-05-08 MED ORDER — BUPROPION HCL ER (XL) 150 MG PO TB24: 150.0000 mg | ORAL_TABLET | ORAL | 1 refills | Status: DC

## 2021-05-08 MED ORDER — RAMELTEON 8 MG PO TABS: 8.0000 mg | ORAL_TABLET | Freq: Every day | ORAL | 1 refills | Status: DC

## 2021-05-08 MED ORDER — SERTRALINE HCL 100 MG PO TABS: 200.0000 mg | ORAL_TABLET | Freq: Every day | ORAL | 1 refills | Status: DC

## 2021-05-08 MED ORDER — DIVALPROEX SODIUM 500 MG PO DR TAB: 500.0000 mg | DELAYED_RELEASE_TABLET | Freq: Two times a day (BID) | ORAL | 1 refills | Status: DC
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
## 2021-05-16 ENCOUNTER — Ambulatory Visit: Payer: Medicare Other | Admitting: Endocrinology

## 2021-06-04 ENCOUNTER — Other Ambulatory Visit (HOSPITAL_COMMUNITY): Payer: Self-pay | Admitting: Physician Assistant

## 2021-06-04 DIAGNOSIS — F411 Generalized anxiety disorder: Secondary | ICD-10-CM

## 2021-06-04 DIAGNOSIS — G4700 Insomnia, unspecified: Secondary | ICD-10-CM

## 2021-06-04 DIAGNOSIS — F39 Unspecified mood [affective] disorder: Secondary | ICD-10-CM

## 2021-06-04 DIAGNOSIS — F331 Major depressive disorder, recurrent, moderate: Secondary | ICD-10-CM

## 2021-06-14 ENCOUNTER — Other Ambulatory Visit (HOSPITAL_COMMUNITY): Payer: Self-pay | Admitting: Physician Assistant

## 2021-06-14 DIAGNOSIS — F39 Unspecified mood [affective] disorder: Secondary | ICD-10-CM

## 2021-06-27 ENCOUNTER — Encounter (HOSPITAL_COMMUNITY): Payer: Medicare Other | Admitting: Physician Assistant

## 2021-07-04 ENCOUNTER — Encounter (HOSPITAL_COMMUNITY): Payer: Self-pay

## 2021-07-04 ENCOUNTER — Telehealth (HOSPITAL_COMMUNITY): Payer: Self-pay | Admitting: Licensed Clinical Social Worker

## 2021-07-04 ENCOUNTER — Ambulatory Visit (HOSPITAL_COMMUNITY): Payer: 59 | Admitting: Licensed Clinical Social Worker

## 2021-07-04 NOTE — Telephone Encounter (Signed)
LCSW sent two links to pt phone with no response. LCSW f/u with a PC and pt entered that chat at [phone]. LCSW explained that there is a policy for being on time for appointments, but assessment could be conducted today. LCSW asked "What is going on that brings you in". Pt response "I had an appt". LCSW stated "Yes for therapy and what brings you in for therapy today". Pt response "Obviously my mind ain't right" LCSW asked pt to elaborate on this". Pt then stated, "I do not have time for your smart-ass comments". Pt proceeded to hang up the phone. LCSW did attempted to contact front desk admin staff to help re connect as the connection was poor and LCSW was afraid things were being taken out of context. Pt spoke with Albin Felling with the front desk but denied request to be re connected.

## 2021-07-08 ENCOUNTER — Ambulatory Visit: Payer: Medicare Other | Admitting: Endocrinology

## 2021-07-29 ENCOUNTER — Encounter (HOSPITAL_COMMUNITY): Payer: Self-pay | Admitting: Physician Assistant

## 2021-07-29 ENCOUNTER — Telehealth (INDEPENDENT_AMBULATORY_CARE_PROVIDER_SITE_OTHER): Payer: Medicaid Other | Admitting: Physician Assistant

## 2021-07-29 DIAGNOSIS — F39 Unspecified mood [affective] disorder: Secondary | ICD-10-CM

## 2021-07-29 DIAGNOSIS — F411 Generalized anxiety disorder: Secondary | ICD-10-CM

## 2021-07-29 DIAGNOSIS — F331 Major depressive disorder, recurrent, moderate: Secondary | ICD-10-CM

## 2021-07-29 DIAGNOSIS — G4700 Insomnia, unspecified: Secondary | ICD-10-CM

## 2021-07-29 MED ORDER — PRAZOSIN HCL 2 MG PO CAPS: 2.0000 mg | ORAL_CAPSULE | Freq: Every day | ORAL | 1 refills | Status: DC

## 2021-07-29 MED ORDER — SERTRALINE HCL 100 MG PO TABS: 200.0000 mg | ORAL_TABLET | Freq: Every day | ORAL | 1 refills | Status: DC

## 2021-07-29 MED ORDER — BUPROPION HCL ER (XL) 150 MG PO TB24: 150.0000 mg | ORAL_TABLET | ORAL | 1 refills | Status: DC

## 2021-07-29 MED ORDER — RAMELTEON 8 MG PO TABS: 8.0000 mg | ORAL_TABLET | Freq: Every day | ORAL | 1 refills | Status: DC

## 2021-07-29 MED ORDER — DIVALPROEX SODIUM 500 MG PO DR TAB: 500.0000 mg | DELAYED_RELEASE_TABLET | Freq: Two times a day (BID) | ORAL | 1 refills | Status: DC

## 2021-07-29 NOTE — Progress Notes (Signed)
BH MD/PA/NP OP Progress Note  Virtual Visit via Telephone Note  I connected with Luis [name] on 07/29/21 at  3:00 PM EST by telephone and verified that I am speaking with the correct person using two identifiers.  Location: Patient: Home Provider: Clinic   I discussed the limitations, risks, security and privacy concerns of performing an evaluation and management service by telephone and the availability of in person appointments. I also discussed with the patient that there may be a patient responsible charge related to this service. The patient expressed understanding and agreed to proceed.  Follow Up Instructions:  I discussed the assessment and treatment plan with the patient. The patient was provided an opportunity to ask questions and all were answered. The patient agreed with the plan and demonstrated an understanding of the instructions.   The patient was advised to call back or seek an in-person evaluation if the symptoms worsen or if the condition fails to improve as anticipated.  I provided 17 minutes of non-face-to-face time during this encounter.  Meta Hatchet, PA    07/29/2021 4:25 PM Luis [name]  MRN:  [id number]  Chief Complaint: Follow up and medication management  HPI:   Luis [name] is a 51 year old male with a past psychiatric history significant for unspecified mood disorder, insomnia, [name] depressive disorder, and generalized anxiety disorder who presents to [facility] via virtual telephone visit for follow-up and medication management.  Patient is currently being managed on the following medications:  Zoloft 200 mg at bedtime Bupropion (Wellbutrin XL) 450 mg 24-hour tablet daily Depakote 500 mg DR 2 times daily Prazosin 2 mg at bedtime Ramelteon 8 mg at bedtime  Patient reports no issues or concerns regarding his current medication regimen.  Patient continues to take his medications  as prescribed.  When asked how he is currently doing, patient replied "I have my good days and I have my bad days.  I mostly feel lonely."  Patien's bad days are characterized by the following symptoms: low mood, self-imposed isolation, and irritability.  Patient inquires about receiving clearance on having an emotional support animal..  Patient states that having a companion in the past has helped in managing his symptoms related to his mental health.  Provider to look into drafting a letter of approval for patient's emotional support animal request.  Despite experiencing some depressive symptoms, patient denies having anxiety and states that he is feeling good.  Patient denied any new stressors at this time and reports no other issues regarding his mental health.  A PHQ-9 screen was performed with the patient scoring a 12.  A GAD-7 screen was also performed with the patient scoring an 8.  Patient is alert and oriented x4, pleasant, calm, cooperative, and fully engaged in conversation during the encounter.  Patient endorses excellent mood.  Patient denies suicidal or homicidal ideation.  He further denies auditory or visual hallucinations and does not appear to be responding to internal/external stimuli.  Patient endorses fluctuating sleep and receives on average 3 hours of sleep each night.  Patient endorses good appetite and eats on average 2 meals per day.  Patient states that he has been limiting his red meat consumption and has been eating more fruits.  Patient denies alcohol consumption and illicit drug use.  Patient endorses tobacco use and smokes on average 2 to 3 cigarettes/day.  Visit Diagnosis:    ICD-10-CM   1. Insomnia, unspecified type  G47.00 prazosin (MINIPRESS) 2 MG  capsule    ramelteon (ROZEREM) 8 MG tablet    2. 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 Unspecified  mood (affective) disorder (HCC)  F39 divalproex (DEPAKOTE) 500 MG DR tablet    sertraline (ZOLOFT) 100 MG tablet    Valproic Acid level    4. Generalized anxiety disorder  F41.1 sertraline (ZOLOFT) 100 MG tablet      Past Psychiatric History:  Insomnia [name] depressive disorder Unspecified mood (affective) disorder Generalized anxiety disorder  Past Medical History:  Past Medical History:  Diagnosis Date   Anxiety    Biventricular ICD (implantable cardioverter-defibrillator) in place    Chronic systolic CHF (congestive heart failure) (HCC)    Cocaine use    Diabetes mellitus without complication (HCC)    GERD (gastroesophageal reflux disease)    HLD (hyperlipidemia)    Hypertension    Morbid obesity (HCC)    NICM (nonischemic cardiomyopathy) (HCC)    OSA (obstructive sleep apnea)    PTSD (post-traumatic stress disorder)    on Depakote   Refusal of blood transfusions as patient is Jehovah's Witness    Tobacco abuse     Past Surgical History:  Procedure Laterality Date   BIV ICD INSERTION CRT-D     FOOT FRACTURE SURGERY     ROTATOR CUFF REPAIR     TONSILLECTOMY      Family Psychiatric History:  Patient is unsure of family history of psychiatric history   Patient reports that his father did three tours in [place]. Patient reports no support from family.  Family History:  Family History  Problem Relation Age of Onset   Hypertension Mother    Bone cancer Father    Lung cancer Father     Social History:  Social History   Socioeconomic History   Marital status: Legally Separated    Spouse name: Not on file   Number of children: Not on file   Years of education: Not on file   Highest education level: Not on file  Occupational History   Not on file  Tobacco Use   Smoking status: Every Day    Types: Cigarettes   Smokeless tobacco: Never   Tobacco comments:    occasionally  Vaping Use   Vaping Use: Never used  Substance and Sexual Activity   Alcohol use:  Not Currently   Drug use: Yes    Types: Cocaine   Sexual activity: Not on file  Other Topics Concern   Not on file  Social History Narrative   Not on file   Social Determinants of Health   Financial Resource Strain: Not on file  Food Insecurity: Not on file  Transportation Needs: Not on file  Physical Activity: Not on file  Stress: Not on file  Social Connections: Not on file    Allergies:  Allergies  Allergen Reactions   Aspirin Anaphylaxis    Throat closing    Iodine-131 Hives    Hives   Iodinated Contrast Media Hives   Latex Hives and Rash   Penicillins Nausea And Vomiting   Tramadol Hives and Nausea Only   Amoxicillin-Pot Clavulanate Diarrhea   Lidocaine Rash   Lisinopril Cough    Metabolic Disorder Labs: Lab Results  Component Value Date   HGBA1C 8.8 (H) 01/27/2021   MPG 205.86 01/27/2021   MPG 246.04 09/30/2020   No results found for: PROLACTIN  Lab Results  Component Value Date   CHOL 141 01/27/2021   TRIG 155 (H) 01/27/2021   HDL 39 (L) 01/27/2021   CHOLHDL 3.6 01/27/2021   VLDL 31 01/27/2021   LDLCALC 71 01/27/2021   LDLCALC 29 10/10/2020   Lab Results  Component Value Date   TSH 0.412 09/30/2020   TSH 1.179 01/18/2019    Therapeutic Level Labs: No results found for: LITHIUM Lab Results  Component Value Date   VALPROATE 18 (L) 11/15/2020   VALPROATE 32 (L) 10/09/2020   No components found for:  CBMZ  Current Medications: Current Outpatient Medications  Medication Sig Dispense Refill   buPROPion (WELLBUTRIN XL) 150 MG 24 hr tablet Take 1 tablet (150 mg total) by mouth every morning. 30 tablet 1   clopidogrel (PLAVIX) 75 MG tablet Take 75 mg by mouth daily.     divalproex (DEPAKOTE) 500 MG DR tablet Take 1 tablet (500 mg total) by mouth 2 (two) times daily. 60 tablet 1   furosemide (LASIX) 80 MG tablet Take 80 mg by mouth daily.     HUMALOG MIX 75/25 KWIKPEN (75-25) 100 UNIT/ML Kwikpen Inject 20 Units into the skin 2 (two) times daily  before a meal. 15 mL 11   metoprolol succinate (TOPROL-XL) 100 MG 24 hr tablet Take 100 mg by mouth at bedtime. Take with or immediately following a meal.     Multiple Vitamin (MULTIVITAMIN WITH MINERALS) TABS tablet Take 1 tablet by mouth daily.     pantoprazole (PROTONIX) 40 MG tablet Take 40 mg by mouth daily.     prazosin (MINIPRESS) 2 MG capsule Take 1 capsule (2 mg total) by mouth at bedtime. 30 capsule 1   promethazine (PHENERGAN) 12.5 MG tablet Take 1 tablet (12.5 mg total) by mouth every 8 (eight) hours as needed (for headache/nausea/vomiting/dizziness). 30 tablet 0   ramelteon (ROZEREM) 8 MG tablet Take 1 tablet (8 mg total) by mouth at bedtime. 30 tablet 1   rosuvastatin (CRESTOR) 20 MG tablet Take 20 mg by mouth every evening.     sertraline (ZOLOFT) 100 MG tablet Take 2 tablets (200 mg total) by mouth daily. 60 tablet 1   VICTOZA 18 MG/3ML SOPN Inject 1.8 mg into the skin daily. 9 mL 0   No current facility-administered medications for this visit.     Musculoskeletal: Strength & Muscle Tone: Unable to assess due to telemedicine visit Gait & Station: Unable to assess due to telemedicine visit Patient leans: Unable to assess due to telemedicine visit  Psychiatric Specialty Exam: Review of Systems  Psychiatric/Behavioral:  Positive for sleep disturbance. Negative for decreased concentration, dysphoric mood, hallucinations, self-injury and suicidal ideas. The patient is not nervous/anxious and is not hyperactive.    There were no vitals taken for this visit.There is no height or weight on file to calculate BMI.  General Appearance: Unable to assess due to telemedicine visit  Eye Contact:  Unable to assess due to telemedicine visit  Speech:  Clear and Coherent and Normal Rate  Volume:  Normal  Mood:  Euthymic  Affect:  Appropriate  Thought Process:  Coherent and Descriptions of Associations: Intact  Orientation:  Full (Time, Place, and Person)  Thought Content: WDL   Suicidal  Thoughts:  No  Homicidal Thoughts:  No  Memory:  Immediate;   Good Recent;   Good Remote;   Good  Judgement:  Good  Insight:  Fair  Psychomotor Activity:  Normal  Concentration:  Concentration: Good and Attention Span: Good  Recall:  Luis [name] of Knowledge: Fair  Language: Good  Akathisia:  No  Handed:  Right  AIMS (if indicated): not done  Assets:  Communication Skills Desire for Improvement Financial Resources/Insurance Talents/Skills  ADL's:  Intact  Cognition: WNL  Sleep:  Poor   Screenings: CAGE-AID    Flowsheet Row ED to Hosp-Admission (Discharged) from 01/27/2021 in [facility]  CAGE-AID Score 1      GAD-7    Flowsheet Row Video Visit from 07/29/2021 in [facility] Office Visit from 05/08/2021 in [facility]  Total GAD-7 Score 8 20      PHQ2-9    Flowsheet Row Video Visit from 07/29/2021 in [facility] Office Visit from 05/08/2021 in [facility]  PHQ-2 Total Score 3 5  PHQ-9 Total Score 12 20      Flowsheet Row Video Visit from 07/29/2021 in [facility] Office Visit from 05/08/2021 in [facility] ED to Hosp-Admission (Discharged) from 01/27/2021 in [facility]  C-SSRS RISK CATEGORY Low Risk High Risk No Risk        Assessment and Plan:   Luis [name] is a 51 year old male with a past psychiatric history significant for unspecified mood disorder, insomnia, [name] depressive disorder, and generalized anxiety disorder who presents to [facility] via virtual telephone visit for follow-up and medication management.  Patient reports that he has been dealing with some mild depression and no anxiety.  Patient states that he is in an excellent mood today.  Patient states that he is currently stable on his  medications and would like to continue taking his medications as prescribed.  Patient's medications to be e-prescribed to pharmacy of choice.  Patient requests the use of an emotional support animal.  Provider informed patient that he would look into drafting a letter of approval for the use of emotional support animal.  1. Insomnia, unspecified type  - prazosin (MINIPRESS) 2 MG capsule; Take 1 capsule (2 mg total) by mouth at bedtime.  Dispense: 30 capsule; Refill: 1 - ramelteon (ROZEREM) 8 MG tablet; Take 1 tablet (8 mg total) by mouth at bedtime.  Dispense: 30 tablet; Refill: 1  2. 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  Dispense: 30 tablet; Refill: 1 - sertraline (ZOLOFT) 100 MG tablet; Take 2 tablets (200 mg total) by mouth daily.  Dispense: 60 tablet; Refill: 1  3. Unspecified mood (affective) disorder (HCC)  - divalproex (DEPAKOTE) 500 MG DR tablet; Take 1 tablet (500 mg total) by mouth 2 (two) times daily.  Dispense: 60 tablet; Refill: 1 - sertraline (ZOLOFT) 100 MG tablet; Take 2 tablets (200 mg total) by mouth daily.  Dispense: 60 tablet; Refill: 1 - Valproic Acid level  4. Generalized anxiety disorder  - sertraline (ZOLOFT) 100 MG tablet; Take 2 tablets (200 mg total) by mouth daily.  Dispense: 60 tablet; Refill: 1  Patient to follow up in 2 months Provider spent a total of 17 minutes with the patient/reviewing patient's chart  Meta Hatchet, PA 07/29/2021, 4:25 PM

## 2021-08-05 ENCOUNTER — Encounter (HOSPITAL_COMMUNITY): Payer: Self-pay | Admitting: Emergency Medicine

## 2021-08-05 ENCOUNTER — Ambulatory Visit (HOSPITAL_COMMUNITY)
Admission: EM | Admit: 2021-08-05 | Discharge: 2021-08-06 | Disposition: A | Discharge status: Other Acute Inpatient Hospital | Payer: Medicare Other | Attending: Psychiatry | Admitting: Psychiatry

## 2021-08-05 DIAGNOSIS — R45851 Suicidal ideations: Secondary | ICD-10-CM | POA: Diagnosis not present

## 2021-08-05 DIAGNOSIS — F332 Major depressive disorder, recurrent severe without psychotic features: Secondary | ICD-10-CM | POA: Diagnosis present

## 2021-08-05 DIAGNOSIS — F331 Major depressive disorder, recurrent, moderate: Secondary | ICD-10-CM | POA: Insufficient documentation

## 2021-08-05 DIAGNOSIS — G4700 Insomnia, unspecified: Secondary | ICD-10-CM | POA: Diagnosis not present

## 2021-08-05 DIAGNOSIS — F1410 Cocaine abuse, uncomplicated: Secondary | ICD-10-CM | POA: Insufficient documentation

## 2021-08-05 DIAGNOSIS — F411 Generalized anxiety disorder: Secondary | ICD-10-CM | POA: Insufficient documentation

## 2021-08-05 DIAGNOSIS — Z20822 Contact with and (suspected) exposure to covid-19: Secondary | ICD-10-CM | POA: Diagnosis not present

## 2021-08-05 LAB — POCT URINE DRUG SCREEN - MANUAL ENTRY (I-SCREEN)
POC Amphetamine UR: NOT DETECTED
POC Buprenorphine (BUP): NOT DETECTED
POC Cocaine UR: POSITIVE — AB
POC Marijuana UR: NOT DETECTED
POC Methadone UR: NOT DETECTED
POC Methamphetamine UR: NOT DETECTED
POC Morphine: NOT DETECTED
POC Oxazepam (BZO): NOT DETECTED
POC Oxycodone UR: NOT DETECTED
POC Secobarbital (BAR): NOT DETECTED

## 2021-08-05 LAB — CBC WITH DIFFERENTIAL/PLATELET
Abs Immature Granulocytes: 0.02 10*3/uL (ref 0.00–0.07)
Basophils Absolute: 0 10*3/uL (ref 0.0–0.1)
Basophils Relative: 0 %
Eosinophils Absolute: 0.1 10*3/uL (ref 0.0–0.5)
Eosinophils Relative: 1 %
HCT: 44.9 % (ref 39.0–52.0)
Hemoglobin: 14.7 g/dL (ref 13.0–17.0)
Immature Granulocytes: 0 %
Lymphocytes Relative: 26 %
Lymphs Abs: 2.1 10*3/uL (ref 0.7–4.0)
MCH: 29.2 pg (ref 26.0–34.0)
MCHC: 32.7 g/dL (ref 30.0–36.0)
MCV: 89.1 fL (ref 80.0–100.0)
Monocytes Absolute: 0.7 10*3/uL (ref 0.1–1.0)
Monocytes Relative: 8 %
Neutro Abs: 5.1 10*3/uL (ref 1.7–7.7)
Neutrophils Relative %: 65 %
Platelets: 204 10*3/uL (ref 150–400)
RBC: 5.04 MIL/uL (ref 4.22–5.81)
RDW: 12.7 % (ref 11.5–15.5)
WBC: 7.9 10*3/uL (ref 4.0–10.5)
nRBC: 0 % (ref 0.0–0.2)

## 2021-08-05 LAB — LIPID PANEL
Cholesterol: 143 mg/dL (ref 0–200)
HDL: 33 mg/dL — ABNORMAL LOW (ref >40)
LDL Cholesterol: 61 mg/dL (ref 0–99)
Total CHOL/HDL Ratio: 4.3 RATIO
Triglycerides: 245 mg/dL — ABNORMAL HIGH (ref <150)
VLDL: 49 mg/dL — ABNORMAL HIGH (ref 0–40)

## 2021-08-05 LAB — COMPREHENSIVE METABOLIC PANEL
ALT: 20 U/L (ref 0–44)
AST: 20 U/L (ref 15–41)
Albumin: 3.4 g/dL — ABNORMAL LOW (ref 3.5–5.0)
Alkaline Phosphatase: 37 U/L — ABNORMAL LOW (ref 38–126)
Anion gap: 8 (ref 5–15)
BUN: 7 mg/dL (ref 6–20)
CO2: 26 mmol/L (ref 22–32)
Calcium: 8.9 mg/dL (ref 8.9–10.3)
Chloride: 104 mmol/L (ref 98–111)
Creatinine, Ser: 0.91 mg/dL (ref 0.61–1.24)
GFR, Estimated: >60 mL/min (ref >60)
Glucose, Bld: 339 mg/dL — ABNORMAL HIGH (ref 70–99)
Potassium: 4 mmol/L (ref 3.5–5.1)
Sodium: 138 mmol/L (ref 135–145)
Total Bilirubin: 0.5 mg/dL (ref 0.3–1.2)
Total Protein: 5.5 g/dL — ABNORMAL LOW (ref 6.5–8.1)

## 2021-08-05 LAB — URINALYSIS, COMPLETE (UACMP) WITH MICROSCOPIC
Bacteria, UA: NONE SEEN
Bilirubin Urine: NEGATIVE
Glucose, UA: >=500 mg/dL — AB
Ketones, ur: NEGATIVE mg/dL
Leukocytes,Ua: NEGATIVE
Nitrite: NEGATIVE
Protein, ur: NEGATIVE mg/dL
Specific Gravity, Urine: 1.03 (ref 1.005–1.030)
pH: 5 (ref 5.0–8.0)

## 2021-08-05 LAB — POC SARS CORONAVIRUS 2 AG: SARSCOV2ONAVIRUS 2 AG: NEGATIVE

## 2021-08-05 LAB — GLUCOSE, CAPILLARY: Glucose-Capillary: 215 mg/dL — ABNORMAL HIGH (ref 70–99)

## 2021-08-05 LAB — HEMOGLOBIN A1C
Hgb A1c MFr Bld: 10 % — ABNORMAL HIGH (ref 4.8–5.6)
Mean Plasma Glucose: 240.3 mg/dL

## 2021-08-05 LAB — RESP PANEL BY RT-PCR (FLU A&B, COVID) ARPGX2
Influenza A by PCR: NEGATIVE
Influenza B by PCR: NEGATIVE
SARS Coronavirus 2 by RT PCR: NEGATIVE

## 2021-08-05 LAB — HIV ANTIBODY (ROUTINE TESTING W REFLEX): HIV Screen 4th Generation wRfx: NONREACTIVE

## 2021-08-05 LAB — POC SARS CORONAVIRUS 2 AG -  ED: SARS Coronavirus 2 Ag: NEGATIVE

## 2021-08-05 LAB — TSH: TSH: 0.55 u[IU]/mL (ref 0.350–4.500)

## 2021-08-05 LAB — ETHANOL: Alcohol, Ethyl (B): <10 mg/dL (ref <10)

## 2021-08-05 LAB — MAGNESIUM: Magnesium: 2.2 mg/dL (ref 1.7–2.4)

## 2021-08-05 LAB — VALPROIC ACID LEVEL: Valproic Acid Lvl: <10 ug/mL — ABNORMAL LOW (ref 50.0–100.0)

## 2021-08-05 MED ORDER — BUPROPION HCL ER (XL) 150 MG PO TB24
150.0000 mg | ORAL_TABLET | ORAL | Status: DC
  Administered 2021-08-06: 150 mg via ORAL
  Filled 2021-08-05: qty 1

## 2021-08-05 MED ORDER — RAMELTEON 8 MG PO TABS
8.0000 mg | ORAL_TABLET | Freq: Every day | ORAL | Status: DC
  Administered 2021-08-05: 8 mg via ORAL
  Filled 2021-08-05: qty 1

## 2021-08-05 MED ORDER — PRAZOSIN HCL 2 MG PO CAPS
2.0000 mg | ORAL_CAPSULE | Freq: Every day | ORAL | Status: DC
  Administered 2021-08-05: 2 mg via ORAL
  Filled 2021-08-05: qty 1

## 2021-08-05 MED ORDER — ALUM & MAG HYDROXIDE-SIMETH 200-200-20 MG/5ML PO SUSP: 30.0000 mL | ORAL | Status: DC | PRN

## 2021-08-05 MED ORDER — LIRAGLUTIDE 18 MG/3ML ~~LOC~~ SOPN: 1.8000 mg | PEN_INJECTOR | Freq: Every day | SUBCUTANEOUS | Status: DC

## 2021-08-05 MED ORDER — ACETAMINOPHEN 325 MG PO TABS: 650.0000 mg | ORAL_TABLET | Freq: Four times a day (QID) | ORAL | Status: DC | PRN

## 2021-08-05 MED ORDER — MAGNESIUM HYDROXIDE 400 MG/5ML PO SUSP: 30.0000 mL | Freq: Every day | ORAL | Status: DC | PRN

## 2021-08-05 MED ORDER — ROSUVASTATIN CALCIUM 20 MG PO TABS
20.0000 mg | ORAL_TABLET | Freq: Every evening | ORAL | Status: DC
  Administered 2021-08-05: 20 mg via ORAL
  Filled 2021-08-05: qty 1

## 2021-08-05 MED ORDER — HYDROXYZINE HCL 25 MG PO TABS
25.0000 mg | ORAL_TABLET | Freq: Three times a day (TID) | ORAL | Status: DC | PRN
  Administered 2021-08-05 (×2): 25 mg via ORAL
  Filled 2021-08-05 (×2): qty 1

## 2021-08-05 MED ORDER — FUROSEMIDE 40 MG PO TABS
80.0000 mg | ORAL_TABLET | Freq: Every day | ORAL | Status: DC
Start: 2021-08-05 — End: 2021-08-06
  Administered 2021-08-05 – 2021-08-06 (×2): 80 mg via ORAL
  Filled 2021-08-05 (×2): qty 2

## 2021-08-05 MED ORDER — INSULIN ASPART PROT & ASPART (70-30 MIX) 100 UNIT/ML ~~LOC~~ SUSP
20.0000 [IU] | Freq: Two times a day (BID) | SUBCUTANEOUS | Status: DC
  Administered 2021-08-05 – 2021-08-06 (×2): 20 [IU] via SUBCUTANEOUS

## 2021-08-05 MED ORDER — DIVALPROEX SODIUM 500 MG PO DR TAB
500.0000 mg | DELAYED_RELEASE_TABLET | Freq: Two times a day (BID) | ORAL | Status: DC
  Administered 2021-08-05 – 2021-08-06 (×3): 500 mg via ORAL
  Filled 2021-08-05 (×3): qty 1

## 2021-08-05 MED ORDER — PANTOPRAZOLE SODIUM 40 MG PO TBEC
40.0000 mg | DELAYED_RELEASE_TABLET | Freq: Every day | ORAL | Status: DC
  Administered 2021-08-05 – 2021-08-06 (×2): 40 mg via ORAL
  Filled 2021-08-05 (×2): qty 1

## 2021-08-05 MED ORDER — SERTRALINE HCL 100 MG PO TABS
200.0000 mg | ORAL_TABLET | Freq: Every day | ORAL | Status: DC
  Administered 2021-08-05 – 2021-08-06 (×2): 200 mg via ORAL
  Filled 2021-08-05 (×2): qty 2

## 2021-08-05 NOTE — ED Notes (Signed)
Pt refused Lasix 80mg  after it was scanned.

## 2021-08-05 NOTE — Progress Notes (Signed)
Patient has been denied by [facility] due to no beds available. Patient meets BH inpatient criteria per Vernard Gambles, NP. Patient has been faxed out to the following facilities:    [facility]  [address] [place] [address] [phone]  [facility]  [address] [place] [address] [phone]  [facility]  [address], [place] [address] [phone]  [facility]  [address] [place] [address] [phone]  [facility]  [address] [place], [place] [address] [phone]  [facility]  [address] [place], [place] [address] [phone]  [facility]  [address] [place] [address] [phone]  [facility]  [address] [place] [address] [phone]  [facility]  [address], [place] [address] [phone]  [facility]  [address] [place] [address] [phone]   Damita Dunnings, MSW, LCSW-A  1:17 PM 08/05/2021

## 2021-08-05 NOTE — Progress Notes (Signed)
RN went in to draw blood labs, patient states he did not know he was going to stay overnight. Patient verified name with RN and then proceeded to say, " You are not sticking my arm, every time some sticks my arm they bruise me. Patient held out his hand and stated here." Patient states, " Y'all only get one shot with me that's it." RN informed patient that was fine. Although flashback was in hub patient stated, " take it out."  RN took out as requested.  This RN will not try again.

## 2021-08-05 NOTE — ED Notes (Signed)
Pt stated that he wants something to eat and staff offered him a peanut and jelly sandwich and he said he don't eat that, then I told him this are the meal times, he then became belligerent and cussing at staff, he stated that he was not no kid he was a grown ass man, then he asked how does he have to stay here and I explained that he needs to speak tp the NP and he then went on to say that he wanted to leave.I relayed the message to the NP

## 2021-08-05 NOTE — ED Notes (Signed)
Pt given salad and meal.  Will continue to monitor for safety °

## 2021-08-05 NOTE — ED Notes (Signed)
Pt had a few items for snack

## 2021-08-05 NOTE — ED Notes (Signed)
Pt laying in bed watching television calm and cooperative. No c/o pain or distress. Will continue to monitor for safety
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
## 2021-08-05 NOTE — ED Notes (Signed)
Pt just came on the unit and he was given lunch

## 2021-08-05 NOTE — ED Notes (Signed)
Late entry.   Pt was searched with no contraband found.  , ekg performed, and blood drawn.     Pt given  sandwich and box lunch.  Continues to ask for food.  Pt has flat affect.  Depressed mood.   Reports recent cocaine use.   Oriented to milieu.

## 2021-08-05 NOTE — BH Assessment (Signed)
Comprehensive Clinical Assessment (CCA) Note  08/05/2021 DAMARRI VANTUYL [id number]  Disposition: Per Thomes Lolling, NP inpatient treatment is recommended.  Disposition SW to pursue appropriate inpatient options.  The patient demonstrates the following risk factors for suicide: Chronic risk factors for suicide include: psychiatric disorder of MDD, Unspecified Mood D/O, GAD and insomnia and previous suicide attempts X3 most recent 1 month ago by overdose-admit to [facility] program . Acute risk factors for suicide include: family or marital conflict and loss (financial, interpersonal, professional). Protective factors for this patient include: positive therapeutic relationship and coping skills. Considering these factors, the overall suicide risk at this point appears to be moderate. Patient is appropriate for outpatient follow up once stabilized.   Patient is a 51 year old male with a history of MDD, Unspecified Mood D/O, GAD and insomnia who presents voluntarily to [facility] for assessment.  Patient presents via GPD reporting worsening depression related to multiple life stressors.  He also endorses SI and HI towards landlord that is "nit picking."  He shares that he was awakened this morning to "bounty hunters coming to take my girlfriend that apparently has warrants."  She was [place] the county jail.  Patient states he also realized his cash app was hacked and his money was stolen, so he is not sure how he will pay his rent.  Patient states he is "just tired" and doesn't want to live anymore.  He endorses SI with intent, stating he will use whatever means is near him at the moment.  Patient has a hx of past attempts x3, most recent attempt 1 month ago by overdose, after which he was admitted to [facility] for inpatient treatment.  Patient began seeing Trinna Post, PA with [facility] for med management and has not been compliant with medications, stating "some don't help."   Patient receives SSDI due to heart condition and he has section 8 housing.  He is frustrated with his landlord, as she is "on me about everything."  He states she is charging him another late fee since he forgot to sign his check "which sat in her box all weekend, so now it's late."  He feels it is at a "harrassment" level and he admits to feeling he may "snap and hurt her if she keeps pushing me."  He is unable to affirm his safety at this time.  Treatment options were discussed and patient is open to recommendation for inpatient treatment.     Chief Complaint: Worsening depression, SI  Visit Diagnosis: Major Depressive Disorder, recurrent, moderate                             Unspecified Mood Disorder                             GAD  Flowsheet Row ED from 08/05/2021 in [facility] Video Visit from 07/29/2021 in [facility] Office Visit from 05/08/2021 in [facility]  Thoughts that you would be better off dead, or of hurting yourself in some way More than half the days Not at all Several days  PHQ-9 Total Score 15 12 20       Flowsheet Row ED from 08/05/2021 in [facility] Video Visit from 07/29/2021 in [facility] Office Visit from 05/08/2021 in [facility]  C-SSRS RISK CATEGORY High Risk Low Risk High Risk        CCA Screening, Triage and Referral (STR)  Patient Reported Information How did you hear about [place]? Legal System  What Is the Reason for Your Visit/Call Today? Patient presents via GPD reporting worsening depression related to multiple life stressors.  He also endorses SI and HI towards landlord that is "nit picking."  He shares that he was awakened this morning to "bounty hunters coming to take my girlfriend that apparently has warrants."  She was taken to the county jail.  Patient states he also realized his cash  app was hacked and his money was stolen, so he is not sure how he will pay his rent.  Patient states he is "just tired" and doesn't want to live anymore.  He endorses SI with intent, stating he will use whatever means is near him at the moment.  Patient has a hx of past attempts x3, most recent attempt 1 month ago by overdose, after which he was admitted to [facility] for inpatient treatment.  He is unable to affirm his safety at this time.  How Long Has This Been Causing You Problems? 1-6 months  What Do You Feel Would Help You the Most Today? Treatment for Depression or other mood problem   Have You Recently Had Any Thoughts About Hurting Yourself? Yes  Are You Planning to Commit Suicide/Harm Yourself At This time? Yes   Have you Recently Had Thoughts About Hurting Someone [place] Dawn? Yes  Are You Planning to Harm Someone at This Time? No (If she "keeps pushing me, I might snap and hurt her" referring to landlord)  Explanation: No data recorded  Have You Used Any Alcohol or Drugs in the Past 24 Hours? No  How Long Ago Did You Use Drugs or Alcohol? No data recorded What Did You Use and How Much? No data recorded  Do You Currently Have a Therapist/Psychiatrist? Yes  Name of Therapist/Psychiatrist: Trinna Post, PA for med management   Have You Been Recently Discharged From Any Office Practice or Programs? No  Explanation of Discharge From Practice/Program: No data recorded    CCA Screening Triage Referral Assessment Type of Contact: Face-to-Face  Telemedicine Service Delivery:   Is this Initial or Reassessment? No data recorded Date Telepsych consult ordered in [facility]:  No data recorded Time Telepsych consult ordered in CHL:  No data recorded Location of Assessment: [facility] Assessment Services  Provider Location: [facility] Assessment Services   Collateral Involvement: NA   Does Patient Have a [place]? No data recorded Name and Contact of Legal Guardian: No data  recorded If Minor and Not Living with Parent(s), Who has Custody? No data recorded Is CPS involved or ever been involved? Never  Is APS involved or ever been involved? Never   Patient Determined To Be At Risk for Harm To Self or Others Based on Review of Patient Reported Information or Presenting Complaint? Yes, for Self-Harm  Method: No data recorded Availability of Means: No data recorded Intent: No data recorded Notification Required: No data recorded Additional Information for Danger to Others Potential: No data recorded Additional Comments for Danger to Others Potential: No data recorded Are There Guns or Other Weapons in Your Home? No data recorded Types of Guns/Weapons: No data recorded Are These Weapons Safely Secured?  No data recorded Who Could Verify You Are Able To Have These Secured: No data recorded Do You Have any Outstanding Charges, Pending Court Dates, Parole/Probation? No data recorded Contacted To Inform of Risk of Harm To Self or Others: -- [facility] staff aware of safety concerns)    Does Patient Present under Involuntary Commitment? No  IVC Papers Initial File Date: No data recorded  [place] of Residence: [place]   Patient Currently Receiving the Following Services: Medication Management   Determination of Need: Emergent (2 hours)   Options For Referral: Inpatient Hospitalization     CCA Biopsychosocial Patient Reported Schizophrenia/Schizoaffective Diagnosis in Past: No   Strengths: Engaged in outpt tx, open to tx recommendations today   Mental Health Symptoms Depression:   Hopelessness; Sleep (too much or little); Change in energy/activity; Difficulty Concentrating; Worthlessness   Duration of Depressive symptoms:  Duration of Depressive Symptoms: Greater than two weeks   Mania:   N/A   Anxiety:    Worrying; Tension   Psychosis:   None   Duration of Psychotic symptoms:    Trauma:   N/A; Irritability/anger;  Emotional numbing   Obsessions:   N/A   Compulsions:   N/A   Inattention:   N/A   Hyperactivity/Impulsivity:   N/A   Oppositional/Defiant Behaviors:   N/A   Emotional Irregularity:   N/A   Other Mood/Personality Symptoms:  No data recorded   Mental Status Exam Appearance and self-care  Stature:   Average   Weight:   Overweight   Clothing:   Neat/clean; Age-appropriate   Grooming:   Normal   Cosmetic use:  No data recorded  Posture/gait:   Normal   Motor activity:   Not Remarkable   Sensorium  Attention:   Normal   Concentration:   Normal   Orientation:   X5   Recall/memory:   Normal   Affect and Mood  Affect:   Appropriate; Full Range   Mood:   Euthymic   Relating  Eye contact:   Normal   Facial expression:   Responsive   Attitude toward examiner:   Cooperative   Thought and Language  Speech flow:  Clear and Coherent   Thought content:   Appropriate to Mood and Circumstances   Preoccupation:   None   Hallucinations:   None   Organization:  No data recorded  [employer] of Knowledge:   Good   Intelligence:   Average   Abstraction:   Normal   Judgement:   Fair   Art therapist:   Adequate   Insight:   Good   Decision Making:   Normal   Social Functioning  Social Maturity:   Responsible   Social Judgement:   Normal   Stress  Stressors:   Illness; Grief/losses   Coping Ability:   Normal   Skill Deficits:   None   Supports:   Support needed     Religion: Religion/Spirituality Are You A Religious Person?: No  Leisure/Recreation: Leisure / Recreation Do You Have Hobbies?: No  Exercise/Diet: Exercise/Diet Do You Exercise?: No Have You Gained or Lost A Significant Amount of Weight in the Past Six Months?: No Do You Follow a Special Diet?: No Do You Have Any Trouble Sleeping?: Yes Explanation of Sleeping Difficulties: varies   CCA  Employment/Education Employment/Work Situation: Employment / Work Situation Employment Situation: On disability Why is Patient on Disability: medical How Long has Patient Been on Disability: NA 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) Did You Receive Any Psychiatric Treatment/Services While in the Military?:  (NA)  Education: Education Is Patient Currently Attending School?: No Last Grade Completed: 12 Did You Attend College?: No Did You Have An Individualized Education Program (IIEP): No Did You Have Any Difficulty At School?: No Patient's Education Has Been Impacted by Current Illness: No   CCA Family/Childhood History Family and Relationship History: Family history Marital status: Widowed Widowed, when?: 1 yr ago Does patient have children?: Yes How many children?: 7 How is patient's relationship with their children?: son died at 51 1/2 years old about 4 years ago  Childhood History:  Childhood History By whom was/is the patient raised?: Mother, Father Did patient suffer any verbal/emotional/physical/sexual abuse as a child?: No Did patient suffer from severe childhood neglect?: No Has patient ever been sexually abused/assaulted/raped as an adolescent or adult?: No Was the patient ever a victim of a crime or a disaster?: No Witnessed domestic violence?: No Has patient been affected by domestic violence as an adult?: No  Child/Adolescent Assessment:     CCA Substance Use Alcohol/Drug Use:                           ASAM's:  Six Dimensions of Multidimensional Assessment  Dimension 1:  Acute Intoxication and/or Withdrawal Potential:      Dimension 2:  Biomedical Conditions and Complications:      Dimension 3:  Emotional, Behavioral, or Cognitive Conditions and Complications:     Dimension 4:  Readiness to Change:     Dimension 5:  Relapse, Continued use, or Continued Problem Potential:     Dimension 6:   Recovery/Living Environment:     ASAM Severity Score:    ASAM Recommended Level of Treatment:     Substance use Disorder (SUD)    Recommendations for Services/Supports/Treatments:    Discharge Disposition:    DSM5 Diagnoses: Patient Active Problem List   Diagnosis Date Noted   Moderate episode of recurrent major depressive disorder ([place]) 05/08/2021   Generalized anxiety disorder 05/08/2021   Unspecified mood (affective) disorder ([place]) 05/08/2021   Insomnia 05/08/2021   Right arm weakness 01/28/2021   Right sided weakness 01/28/2021   Nonischemic cardiomyopathy ([place])    Morbid obesity ([place]) 05/05/2019   Cocaine use disorder ([place])    Chronic systolic CHF (congestive heart failure) ([place]) 01/17/2019   Pacemaker    HLD (hyperlipidemia)    Diabetes mellitus without complication ([place])    Hypertension    GERD (gastroesophageal reflux disease)    Tobacco abuse      Referrals to Alternative Service(s): Referred to Alternative Service(s):   Place:   Date:   Time:    Referred to Alternative Service(s):   Place:   Date:   Time:    Referred to Alternative Service(s):   Place:   Date:   Time:    Referred to Alternative Service(s):   Place:   Date:   Time:     Fransico Meadow, [facility]

## 2021-08-05 NOTE — Progress Notes (Signed)
°   08/05/21 1109  BHUC Triage Screening (Walk-ins at [facility] only)  How Did You Hear About [place]? Legal System  What Is the Reason for Your Visit/Call Today? Patient presents via [employer] reporting worsening depression related to multiple life stressors.  He also endorses SI and HI towards landlord that is "nit picking."  He shares that he was awakened this morning to "bounty hunters coming to take my girlfriend that apparently has warrants."  She was [place] the [place] jail.  Patient states he also realized his cash app was hacked and his money was stolen, so he is not sure how he will pay his rent.  Patient states he is "just tired" and doesn't want to live anymore.  He endorses SI with intent, stating he will use whatever means is near him at the moment.  Patient has a hx of past attempts x3, most recent attempt 1 month ago by overdose, after which he was admitted to [facility] for inpatient treatment.  He is unable to affirm his safety at this time.  How Long Has This Been Causing You Problems? 1-6 months  Have You Recently Had Any Thoughts About Hurting Yourself? Yes  How long ago did you have thoughts about hurting yourself? today  Are You Planning to Commit Suicide/Harm Yourself At This time? Yes  Have you Recently Had Thoughts About Hurting Someone Karolee Ohs? Yes  How long ago did you have thoughts of harming others? today  Are You Planning To Harm Someone At This Time? No (If she "keeps pushing me, I might snap and hurt her" referring to landlord)  Are you currently experiencing any auditory, visual or other hallucinations? No  Have You Used Any Alcohol or Drugs in the Past 24 Hours? No  Do you have any current medical co-morbidities that require immediate attention? No  Clinician description of patient physical appearance/behavior: Patient presents with flat affect, poor eye contact and visibly distressed and hopeless.  He is cooperative, AAOx5.  What Do You Feel Would Help You the Most Today? Treatment for  Depression or other mood problem  If access to [facility] was not available, would you have sought care in the Emergency Department? Yes  Determination of Need Emergent (2 hours)  Options For Referral Inpatient Hospitalization

## 2021-08-05 NOTE — ED Notes (Signed)
Pt sleeping@this time. Breathing even and unlabored. Will continue to monitor for safety 

## 2021-08-06 ENCOUNTER — Other Ambulatory Visit: Payer: Self-pay

## 2021-08-06 ENCOUNTER — Encounter: Payer: Self-pay | Admitting: Family

## 2021-08-06 ENCOUNTER — Inpatient Hospital Stay
Admission: AD | Admit: 2021-08-06 | Discharge: 2021-08-22 | DRG: 885 | Disposition: A | Discharge status: Home / Self Care | Payer: Medicare Other | Source: Intra-hospital | Attending: Psychiatry | Admitting: Psychiatry

## 2021-08-06 DIAGNOSIS — Z7902 Long term (current) use of antithrombotics/antiplatelets: Secondary | ICD-10-CM

## 2021-08-06 DIAGNOSIS — Z794 Long term (current) use of insulin: Secondary | ICD-10-CM

## 2021-08-06 DIAGNOSIS — Z7985 Long-term (current) use of injectable non-insulin antidiabetic drugs: Secondary | ICD-10-CM

## 2021-08-06 DIAGNOSIS — Z95810 Presence of automatic (implantable) cardiac defibrillator: Secondary | ICD-10-CM

## 2021-08-06 DIAGNOSIS — Z531 Procedure and treatment not carried out because of patient's decision for reasons of belief and group pressure: Secondary | ICD-10-CM | POA: Diagnosis present

## 2021-08-06 DIAGNOSIS — Z881 Allergy status to other antibiotic agents status: Secondary | ICD-10-CM

## 2021-08-06 DIAGNOSIS — Z950 Presence of cardiac pacemaker: Secondary | ICD-10-CM | POA: Diagnosis present

## 2021-08-06 DIAGNOSIS — F17210 Nicotine dependence, cigarettes, uncomplicated: Secondary | ICD-10-CM | POA: Diagnosis present

## 2021-08-06 DIAGNOSIS — F1410 Cocaine abuse, uncomplicated: Secondary | ICD-10-CM | POA: Diagnosis present

## 2021-08-06 DIAGNOSIS — I110 Hypertensive heart disease with heart failure: Secondary | ICD-10-CM | POA: Diagnosis present

## 2021-08-06 DIAGNOSIS — G4730 Sleep apnea, unspecified: Secondary | ICD-10-CM

## 2021-08-06 DIAGNOSIS — G4733 Obstructive sleep apnea (adult) (pediatric): Secondary | ICD-10-CM | POA: Diagnosis present

## 2021-08-06 DIAGNOSIS — F1490 Cocaine use, unspecified, uncomplicated: Secondary | ICD-10-CM | POA: Diagnosis not present

## 2021-08-06 DIAGNOSIS — Z880 Allergy status to penicillin: Secondary | ICD-10-CM | POA: Diagnosis not present

## 2021-08-06 DIAGNOSIS — Z91040 Latex allergy status: Secondary | ICD-10-CM

## 2021-08-06 DIAGNOSIS — K219 Gastro-esophageal reflux disease without esophagitis: Secondary | ICD-10-CM | POA: Diagnosis present

## 2021-08-06 DIAGNOSIS — I428 Other cardiomyopathies: Secondary | ICD-10-CM | POA: Diagnosis present

## 2021-08-06 DIAGNOSIS — E119 Type 2 diabetes mellitus without complications: Secondary | ICD-10-CM | POA: Diagnosis present

## 2021-08-06 DIAGNOSIS — Z888 Allergy status to other drugs, medicaments and biological substances status: Secondary | ICD-10-CM

## 2021-08-06 DIAGNOSIS — Z6841 Body Mass Index (BMI) 40.0 and over, adult: Secondary | ICD-10-CM | POA: Diagnosis not present

## 2021-08-06 DIAGNOSIS — Z886 Allergy status to analgesic agent status: Secondary | ICD-10-CM

## 2021-08-06 DIAGNOSIS — Z9151 Personal history of suicidal behavior: Secondary | ICD-10-CM | POA: Diagnosis not present

## 2021-08-06 DIAGNOSIS — Z8249 Family history of ischemic heart disease and other diseases of the circulatory system: Secondary | ICD-10-CM

## 2021-08-06 DIAGNOSIS — Z885 Allergy status to narcotic agent status: Secondary | ICD-10-CM | POA: Diagnosis not present

## 2021-08-06 DIAGNOSIS — I5022 Chronic systolic (congestive) heart failure: Secondary | ICD-10-CM | POA: Diagnosis present

## 2021-08-06 DIAGNOSIS — M7661 Achilles tendinitis, right leg: Secondary | ICD-10-CM | POA: Diagnosis present

## 2021-08-06 DIAGNOSIS — E1169 Type 2 diabetes mellitus with other specified complication: Secondary | ICD-10-CM

## 2021-08-06 DIAGNOSIS — E785 Hyperlipidemia, unspecified: Secondary | ICD-10-CM | POA: Diagnosis present

## 2021-08-06 DIAGNOSIS — F332 Major depressive disorder, recurrent severe without psychotic features: Principal | ICD-10-CM | POA: Diagnosis present

## 2021-08-06 DIAGNOSIS — F4310 Post-traumatic stress disorder, unspecified: Secondary | ICD-10-CM | POA: Diagnosis present

## 2021-08-06 DIAGNOSIS — G4700 Insomnia, unspecified: Secondary | ICD-10-CM | POA: Diagnosis present

## 2021-08-06 DIAGNOSIS — Z634 Disappearance and death of family member: Secondary | ICD-10-CM

## 2021-08-06 DIAGNOSIS — I10 Essential (primary) hypertension: Secondary | ICD-10-CM | POA: Diagnosis present

## 2021-08-06 DIAGNOSIS — R45851 Suicidal ideations: Secondary | ICD-10-CM | POA: Diagnosis present

## 2021-08-06 DIAGNOSIS — Z79899 Other long term (current) drug therapy: Secondary | ICD-10-CM

## 2021-08-06 DIAGNOSIS — E162 Hypoglycemia, unspecified: Secondary | ICD-10-CM | POA: Diagnosis present

## 2021-08-06 DIAGNOSIS — F331 Major depressive disorder, recurrent, moderate: Secondary | ICD-10-CM | POA: Diagnosis not present

## 2021-08-06 DIAGNOSIS — F1420 Cocaine dependence, uncomplicated: Secondary | ICD-10-CM | POA: Diagnosis present

## 2021-08-06 DIAGNOSIS — I509 Heart failure, unspecified: Secondary | ICD-10-CM

## 2021-08-06 DIAGNOSIS — Z79891 Long term (current) use of opiate analgesic: Secondary | ICD-10-CM

## 2021-08-06 LAB — GLUCOSE, CAPILLARY
Glucose-Capillary: 227 mg/dL — ABNORMAL HIGH (ref 70–99)
Glucose-Capillary: 214 mg/dL — ABNORMAL HIGH (ref 70–99)
Glucose-Capillary: 180 mg/dL — ABNORMAL HIGH (ref 70–99)
Glucose-Capillary: 335 mg/dL — ABNORMAL HIGH (ref 70–99)

## 2021-08-06 LAB — GC/CHLAMYDIA PROBE AMP (~~LOC~~) NOT AT ARMC
Chlamydia: NEGATIVE
Comment: NEGATIVE
Comment: NORMAL
Neisseria Gonorrhea: NEGATIVE

## 2021-08-06 LAB — RPR: RPR Ser Ql: NONREACTIVE

## 2021-08-06 MED ORDER — BUPROPION HCL ER (XL) 150 MG PO TB24
150.0000 mg | ORAL_TABLET | Freq: Every day | ORAL | Status: DC
  Administered 2021-08-07: 150 mg via ORAL
  Filled 2021-08-06: qty 1

## 2021-08-06 MED ORDER — ALUM & MAG HYDROXIDE-SIMETH 200-200-20 MG/5ML PO SUSP: 30.0000 mL | ORAL | Status: DC | PRN

## 2021-08-06 MED ORDER — INSULIN ASPART 100 UNIT/ML IJ SOLN
0.0000 [IU] | Freq: Three times a day (TID) | INTRAMUSCULAR | Status: DC
  Administered 2021-08-07: 3 [IU] via SUBCUTANEOUS
  Administered 2021-08-07: 5 [IU] via SUBCUTANEOUS
  Administered 2021-08-07: 2 [IU] via SUBCUTANEOUS
  Administered 2021-08-08: 5 [IU] via SUBCUTANEOUS
  Administered 2021-08-08 (×2): 3 [IU] via SUBCUTANEOUS
  Administered 2021-08-09: 2 [IU] via SUBCUTANEOUS
  Administered 2021-08-09: 3 [IU] via SUBCUTANEOUS
  Administered 2021-08-09: 2 [IU] via SUBCUTANEOUS
  Administered 2021-08-10: 3 [IU] via SUBCUTANEOUS
  Administered 2021-08-10: 2 [IU] via SUBCUTANEOUS
  Administered 2021-08-10: 3 [IU] via SUBCUTANEOUS
  Administered 2021-08-11: 2 [IU] via SUBCUTANEOUS
  Administered 2021-08-12 (×2): 3 [IU] via SUBCUTANEOUS
  Administered 2021-08-12: 2 [IU] via SUBCUTANEOUS
  Administered 2021-08-13 – 2021-08-14 (×2): 3 [IU] via SUBCUTANEOUS
  Administered 2021-08-14: 2 [IU] via SUBCUTANEOUS
  Administered 2021-08-14 – 2021-08-15 (×2): 3 [IU] via SUBCUTANEOUS
  Administered 2021-08-16 (×2): 1 [IU] via SUBCUTANEOUS
  Administered 2021-08-18 – 2021-08-19 (×2): 2 [IU] via SUBCUTANEOUS
  Administered 2021-08-19 (×2): 3 [IU] via SUBCUTANEOUS
  Administered 2021-08-20 – 2021-08-22 (×4): 1 [IU] via SUBCUTANEOUS
  Filled 2021-08-06 (×12): qty 1

## 2021-08-06 MED ORDER — ACETAMINOPHEN 325 MG PO TABS
650.0000 mg | ORAL_TABLET | Freq: Four times a day (QID) | ORAL | Status: DC | PRN
  Administered 2021-08-07 – 2021-08-21 (×9): 650 mg via ORAL
  Filled 2021-08-06 (×9): qty 2

## 2021-08-06 MED ORDER — CLOPIDOGREL BISULFATE 75 MG PO TABS
75.0000 mg | ORAL_TABLET | Freq: Every day | ORAL | Status: DC
  Administered 2021-08-07 – 2021-08-22 (×15): 75 mg via ORAL
  Filled 2021-08-06 (×15): qty 1

## 2021-08-06 MED ORDER — INSULIN ASPART PROT & ASPART (70-30 MIX) 100 UNIT/ML ~~LOC~~ SUSP
20.0000 [IU] | Freq: Two times a day (BID) | SUBCUTANEOUS | Status: DC
  Administered 2021-08-07: 20 [IU] via SUBCUTANEOUS
  Filled 2021-08-06: qty 10

## 2021-08-06 MED ORDER — MAGNESIUM HYDROXIDE 400 MG/5ML PO SUSP: 30.0000 mL | Freq: Every day | ORAL | Status: DC | PRN

## 2021-08-06 MED ORDER — DIVALPROEX SODIUM 500 MG PO DR TAB
500.0000 mg | DELAYED_RELEASE_TABLET | Freq: Two times a day (BID) | ORAL | Status: DC
  Administered 2021-08-06 – 2021-08-07 (×2): 500 mg via ORAL
  Filled 2021-08-06 (×2): qty 1

## 2021-08-06 MED ORDER — HYDROXYZINE HCL 25 MG PO TABS
25.0000 mg | ORAL_TABLET | Freq: Three times a day (TID) | ORAL | Status: DC | PRN
  Administered 2021-08-08 – 2021-08-17 (×6): 25 mg via ORAL
  Filled 2021-08-06 (×7): qty 1

## 2021-08-06 MED ORDER — INSULIN ASPART 100 UNIT/ML IJ SOLN
0.0000 [IU] | Freq: Every day | INTRAMUSCULAR | Status: DC
  Administered 2021-08-06: 4 [IU] via SUBCUTANEOUS
  Administered 2021-08-07: 2 [IU] via SUBCUTANEOUS
  Administered 2021-08-10: 5 [IU] via SUBCUTANEOUS
  Administered 2021-08-11 – 2021-08-12 (×2): 2 [IU] via SUBCUTANEOUS
  Administered 2021-08-13: 3 [IU] via SUBCUTANEOUS
  Administered 2021-08-15 – 2021-08-18 (×2): 2 [IU] via SUBCUTANEOUS
  Filled 2021-08-06 (×6): qty 1

## 2021-08-06 MED ORDER — CARVEDILOL 12.5 MG PO TABS
6.2500 mg | ORAL_TABLET | Freq: Two times a day (BID) | ORAL | Status: DC
  Administered 2021-08-07 – 2021-08-22 (×30): 6.25 mg via ORAL
  Filled 2021-08-06 (×30): qty 1

## 2021-08-06 MED ORDER — SERTRALINE HCL 100 MG PO TABS
100.0000 mg | ORAL_TABLET | Freq: Every day | ORAL | Status: DC
  Administered 2021-08-07: 100 mg via ORAL
  Filled 2021-08-06: qty 1

## 2021-08-06 MED ORDER — TRAZODONE HCL 100 MG PO TABS
100.0000 mg | ORAL_TABLET | Freq: Every day | ORAL | Status: DC
  Administered 2021-08-06: 100 mg via ORAL
  Filled 2021-08-06: qty 1

## 2021-08-06 MED ORDER — ROSUVASTATIN CALCIUM 20 MG PO TABS
20.0000 mg | ORAL_TABLET | Freq: Every day | ORAL | Status: DC
  Administered 2021-08-07 – 2021-08-22 (×15): 20 mg via ORAL
  Filled 2021-08-06 (×17): qty 1

## 2021-08-06 MED ORDER — DULOXETINE HCL 30 MG PO CPEP
90.0000 mg | ORAL_CAPSULE | Freq: Every day | ORAL | Status: DC
  Administered 2021-08-07 – 2021-08-22 (×15): 90 mg via ORAL
  Filled 2021-08-06 (×15): qty 3

## 2021-08-06 MED ORDER — PRAZOSIN HCL 2 MG PO CAPS
2.0000 mg | ORAL_CAPSULE | Freq: Every day | ORAL | Status: DC
  Administered 2021-08-06 – 2021-08-21 (×16): 2 mg via ORAL
  Filled 2021-08-06 (×17): qty 1

## 2021-08-06 MED ORDER — PANTOPRAZOLE SODIUM 40 MG PO TBEC
40.0000 mg | DELAYED_RELEASE_TABLET | Freq: Every day | ORAL | Status: DC
  Administered 2021-08-07 – 2021-08-22 (×15): 40 mg via ORAL
  Filled 2021-08-06 (×15): qty 1

## 2021-08-06 NOTE — ED Notes (Signed)
Pt resting quietly denies SI, HI, and AVH at this time.  No pain or discomfort noted or voiced.  Breakfast provided.  When asked how he was this morning the pt replied "I just woke up."  Pt speaks in gruff tones when speaking with staff.  Will continue to monitor for safety.

## 2021-08-06 NOTE — Progress Notes (Signed)
BHH/BMU LCSW Progress Note   08/06/2021    1:46 PM  GALVIN AVERSA   [id number]   Type of Contact and Topic:  Psychiatric Bed Placement   Pt accepted to [facility] 319    Patient meets inpatient criteria per Vernard Gambles, NP   The attending provider will be Mordecai Rasmussen, MD  Call report to [phone]  Isaiah Serge, LPN @ [facility] notified.     Pt scheduled  to arrive at [facility] TODAY at 1430.    Damita Dunnings, MSW, LCSW-A  1:47 PM 08/06/2021

## 2021-08-06 NOTE — BH Assessment (Signed)
Patient is to be admitted to [facility] BMU today 08/06/21 at 2:30pm by Dr. Toni Amend.  Attending Physician will be Dr.  Toni Amend .   Patient has been assigned to room 319, by [facility] Charge Nurse, Wendall Mola.    ER staff is aware of the admission:  Sue Lush, Patient Access.

## 2021-08-06 NOTE — ED Notes (Signed)
Report called and safe transport contacted.  Will continue to monitor for safety.

## 2021-08-06 NOTE — Progress Notes (Signed)
Patient ID: LINVEL SCHWENKER, male   DOB: 1971-03-08, 51 y.o.   MRN: [id number] Admission note: Patient is a 51 year old male, presents voluntary per report of major depressive disorder, recurrent severe, without psychosis. Patient is alert and oriented to unit. Patients affect is flat. Patient is cooperative during assessment questions but was vague with answers, giving one worded answers. Patient avoids eye contact. Patient presents with generalized folliculitis on his back and arms, varicose veins on bilateral legs, scar on his right shin, and a knot on his forehead. Patient states that he is here for depression. Patient did not want to go into detail about the reason for his admission to the unit. Patient later identified stressors related to admission as "women" and financial issues, stating that all of his "money is gone from [employer]". Patient currently denies SI/HI/AVH. Unit policies explained and verbalized understanding. Q15 minute checks maintained and will continue to monitor.

## 2021-08-06 NOTE — Progress Notes (Signed)
Luis [name] was transported via [employer] without incident.

## 2021-08-06 NOTE — ED Notes (Signed)
Pt had a snack  °

## 2021-08-06 NOTE — Tx Team (Signed)
Initial Treatment Plan 08/06/2021 5:10 PM Luis [name] [id number]    PATIENT STRESSORS: Financial difficulties     PATIENT STRENGTHS: Supportive family/friends    PATIENT IDENTIFIED PROBLEMS: Anxiety, depression, difficulty sleeping                     DISCHARGE CRITERIA:  Improved stabilization in mood, thinking, and/or behavior  PRELIMINARY DISCHARGE PLAN: Return to previous living arrangement  PATIENT/FAMILY INVOLVEMENT: This treatment plan has been presented to and reviewed with the patient, [name]. The patient and family have been given the opportunity to ask questions and make suggestions.  Hyman Hopes, RN 08/06/2021, 5:10 PM

## 2021-08-06 NOTE — ED Notes (Signed)
Pt had breakfast. 

## 2021-08-07 DIAGNOSIS — F332 Major depressive disorder, recurrent severe without psychotic features: Principal | ICD-10-CM

## 2021-08-07 DIAGNOSIS — G4730 Sleep apnea, unspecified: Secondary | ICD-10-CM

## 2021-08-07 DIAGNOSIS — G4733 Obstructive sleep apnea (adult) (pediatric): Secondary | ICD-10-CM

## 2021-08-07 LAB — GLUCOSE, CAPILLARY
Glucose-Capillary: 286 mg/dL — ABNORMAL HIGH (ref 70–99)
Glucose-Capillary: 206 mg/dL — ABNORMAL HIGH (ref 70–99)
Glucose-Capillary: 177 mg/dL — ABNORMAL HIGH (ref 70–99)
Glucose-Capillary: 201 mg/dL — ABNORMAL HIGH (ref 70–99)

## 2021-08-07 MED ORDER — TRAZODONE HCL 100 MG PO TABS
200.0000 mg | ORAL_TABLET | Freq: Every day | ORAL | Status: DC
  Administered 2021-08-07 – 2021-08-21 (×15): 200 mg via ORAL
  Filled 2021-08-07 (×16): qty 2

## 2021-08-07 MED ORDER — BUPROPION HCL ER (XL) 150 MG PO TB24
300.0000 mg | ORAL_TABLET | Freq: Every day | ORAL | Status: DC
  Administered 2021-08-08 – 2021-08-19 (×11): 300 mg via ORAL
  Filled 2021-08-07 (×11): qty 2

## 2021-08-07 MED ORDER — INSULIN ASPART PROT & ASPART (70-30 MIX) 100 UNIT/ML ~~LOC~~ SUSP
60.0000 [IU] | Freq: Three times a day (TID) | SUBCUTANEOUS | Status: DC
  Administered 2021-08-07 – 2021-08-22 (×38): 60 [IU] via SUBCUTANEOUS
  Filled 2021-08-07 (×4): qty 10

## 2021-08-07 NOTE — Group Note (Signed)
LCSW Group Therapy Note  Group Date: 08/07/2021 Start Time: 1300 End Time: 1400   Type of Therapy and Topic:  Group Therapy - How To Cope with Nervousness about Discharge   Participation Level:  Did Not Attend   Description of Group This process group involved identification of patients' feelings about discharge. Some of them are scheduled to be discharged soon, while others are new admissions, but each of them was asked to share thoughts and feelings surrounding discharge from the hospital. One common theme was that they are excited at the prospect of going home, while another was that many of them are apprehensive about sharing why they were hospitalized. Patients were given the opportunity to discuss these feelings with their peers in preparation for discharge.  Therapeutic Goals  Patient will identify their overall feelings about pending discharge. Patient will think about how they might proactively address issues that they believe will once again arise once they get home (i.e. with parents). Patients will participate in discussion about having hope for change.   Summary of Patient Progress:    X   Therapeutic Modalities Cognitive Behavioral Therapy   [name], LCSWA 08/07/2021  1:58 PM

## 2021-08-07 NOTE — Progress Notes (Signed)
D: Pt alert and oriented. Pt rates depression 0/10, hopelessness 0/10, and anxiety 0/10.  Pt denies experiencing any pain at this time. Pt denies experiencing any SI/HI, or AVH at this time.  ° °A: Scheduled medications administered to pt, per MD orders. Support and encouragement provided. Frequent verbal contact made. Routine safety checks conducted q15 minutes.  ° °R: No adverse drug reactions noted. Pt verbally contracts for safety at this time. Pt complaint with medications and treatment plan. Pt interacts well with others on the unit. Pt remains safe at this time. Will continue to monitor.    °

## 2021-08-07 NOTE — [facility] Suicide Risk Assessment (Signed)
[facility] Admission Suicide Risk Assessment   Nursing information obtained from:  Patient Demographic factors:  Unemployed, Living alone Current Mental Status:  NA Loss Factors:  Decline in physical health, Financial problems / change in socioeconomic status Historical Factors:  Prior suicide attempts Risk Reduction Factors:  Positive social support  Total Time spent with patient: 1 hour Principal Problem: MDD (major depressive disorder), recurrent severe, without psychosis ([place]) Diagnosis:  Principal Problem:   MDD (major depressive disorder), recurrent severe, without psychosis ([place]) Active Problems:   Chronic systolic CHF (congestive heart failure) (HCC)   Pacemaker   Diabetes mellitus without complication ([place])   Hypertension   GERD (gastroesophageal reflux disease)   Cocaine use disorder ([place])   Sleep apnea  Subjective Data: Patient seen and chart reviewed.  51 year old man with recurrent severe depression and substance abuse comes to the hospital he is having active suicidal thoughts and many symptoms of depression.  Many large life stresses.  Denies psychotic symptoms.  Admits to recent drug use.  Cooperative with treatment planning  Continued Clinical Symptoms:  Alcohol Use Disorder Identification Test Final Score (AUDIT): 0 The "Alcohol Use Disorders Identification Test", Guidelines for Use in Primary Care, Second Edition.  World Pharmacologist [facility]). Score between 0-7:  no or low risk or alcohol related problems. Score between 8-15:  moderate risk of alcohol related problems. Score between 16-19:  high risk of alcohol related problems. Score 20 or above:  warrants further diagnostic evaluation for alcohol dependence and treatment.   CLINICAL FACTORS:   Depression:   Comorbid alcohol abuse/dependence Alcohol/Substance Abuse/Dependencies   Musculoskeletal: Strength & Muscle Tone: within normal limits Gait & Station: normal Patient leans: N/A  Psychiatric Specialty  Exam:  Presentation  General Appearance: Disheveled  Eye Contact:Minimal  Speech:Clear and Coherent; Normal Rate  Speech Volume:Normal  Handedness:Right   Mood and Affect  Mood:Irritable  Affect:Congruent; Depressed   Thought Process  Thought Processes:Coherent  Descriptions of Associations:Intact  Orientation:Full (Time, Place and Person)  Thought Content:Logical  History of Schizophrenia/Schizoaffective disorder:No  Duration of Psychotic Symptoms:No data recorded Hallucinations:No data recorded Ideas of Reference:None  Suicidal Thoughts:No data recorded Homicidal Thoughts:No data recorded  Sensorium  Memory:Immediate Good; Recent Good; Remote Good  Judgment:Poor  Insight:Poor   Executive Functions  Concentration:Good  Attention Span:Good  [place] of Knowledge:Good  Language:Good   Psychomotor Activity  Psychomotor Activity:No data recorded  Assets  Assets:Communication Skills; Desire for Improvement; Housing; Intimacy; Leisure Time; Physical Health; Resilience; Social Support   Sleep  Sleep:No data recorded   Physical Exam: Physical Exam Vitals and nursing note reviewed.  Constitutional:      Appearance: Normal appearance.  HENT:     Head: Normocephalic and atraumatic.     Mouth/Throat:     Pharynx: Oropharynx is clear.  Eyes:     Pupils: Pupils are equal, round, and reactive to light.  Cardiovascular:     Rate and Rhythm: Normal rate and regular rhythm.  Pulmonary:     Effort: Pulmonary effort is normal.     Breath sounds: Normal breath sounds.  Abdominal:     General: Abdomen is flat.     Palpations: Abdomen is soft.  Musculoskeletal:        General: Normal range of motion.  Skin:    General: Skin is warm and dry.  Neurological:     General: No focal deficit present.     Mental Status: He is alert. Mental status is at baseline.  Psychiatric:  Attention and Perception: He is inattentive.        Mood  and Affect: Mood is depressed. Affect is blunt.        Speech: Speech is delayed.        Behavior: Behavior is slowed and withdrawn.        Thought Content: Thought content includes suicidal ideation. Thought content does not include suicidal plan.        Cognition and Memory: Cognition is impaired.        Judgment: Judgment is impulsive.   Review of Systems  Constitutional: Negative.   HENT: Negative.    Eyes: Negative.   Respiratory: Negative.    Cardiovascular: Negative.   Gastrointestinal: Negative.   Musculoskeletal: Negative.   Skin: Negative.   Neurological: Negative.   Psychiatric/Behavioral:  Positive for depression, substance abuse and suicidal ideas. Negative for hallucinations. The patient is nervous/anxious and has insomnia.   Blood pressure (!) 141/91, pulse (!) 103, temperature 97.8 F (36.6 C), temperature source Oral, resp. rate 17, height 5\' 5"  (1.651 m), weight 123.4 kg, SpO2 94 %. Body mass index is 45.26 kg/m.   COGNITIVE FEATURES THAT CONTRIBUTE TO RISK:  Thought constriction (tunnel vision)    SUICIDE RISK:   Mild:  Suicidal ideation of limited frequency, intensity, duration, and specificity.  There are no identifiable plans, no associated intent, mild dysphoria and related symptoms, good self-control (both objective and subjective assessment), few other risk factors, and identifiable protective factors, including available and accessible social support.  PLAN OF CARE: Continue 15-minute checks.  Restart medicine.  Engage in individual and group therapy and counseling.  Ongoing reassessment of dangerousness prior to discharge  I certify that inpatient services furnished can reasonably be expected to improve the patient's condition.   Alethia Berthold, MD 08/07/2021, 11:39 AM
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
## 2021-08-07 NOTE — Plan of Care (Signed)
  Problem: Education: Goal: Knowledge of [place] Education information/materials will improve Outcome: Progressing Goal: Emotional status will improve Outcome: Progressing Goal: Mental status will improve Outcome: Progressing Goal: Verbalization of understanding the information provided will improve Outcome: Progressing   Problem: Activity: Goal: Interest or engagement in activities will improve Outcome: Progressing Goal: Sleeping patterns will improve Outcome: Progressing   Problem: Coping: Goal: Ability to verbalize frustrations and anger appropriately will improve Outcome: Progressing Goal: Ability to demonstrate self-control will improve Outcome: Progressing   Problem: Health Behavior/Discharge Planning: Goal: Identification of resources available to assist in meeting health care needs will improve Outcome: Progressing Goal: Compliance with treatment plan for underlying cause of condition will improve Outcome: Progressing   Problem: Physical Regulation: Goal: Ability to maintain clinical measurements within normal limits will improve Outcome: Progressing   Problem: Safety: Goal: Periods of time without injury will increase Outcome: Progressing   Problem: Education: Goal: Utilization of techniques to improve thought processes will improve Outcome: Progressing Goal: Knowledge of the prescribed therapeutic regimen will improve Outcome: Progressing   
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
## 2021-08-08 DIAGNOSIS — F332 Major depressive disorder, recurrent severe without psychotic features: Secondary | ICD-10-CM | POA: Diagnosis not present

## 2021-08-08 LAB — GLUCOSE, CAPILLARY
Glucose-Capillary: 232 mg/dL — ABNORMAL HIGH (ref 70–99)
Glucose-Capillary: 228 mg/dL — ABNORMAL HIGH (ref 70–99)
Glucose-Capillary: 299 mg/dL — ABNORMAL HIGH (ref 70–99)
Glucose-Capillary: 199 mg/dL — ABNORMAL HIGH (ref 70–99)

## 2021-08-08 NOTE — Plan of Care (Signed)
°  Problem: Education: Goal: Knowledge of [facility] Education information/materials will improve Outcome: Progressing Goal: Emotional status will improve Outcome: Progressing Goal: Mental status will improve Outcome: Progressing Goal: Verbalization of understanding the information provided will improve Outcome: Progressing   Problem: Activity: Goal: Interest or engagement in activities will improve Outcome: Progressing Goal: Sleeping patterns will improve Outcome: Progressing   Problem: Coping: Goal: Ability to verbalize frustrations and anger appropriately will improve Outcome: Progressing Goal: Ability to demonstrate self-control will improve Outcome: Progressing   Problem: Health Behavior/Discharge Planning: Goal: Identification of resources available to assist in meeting health care needs will improve Outcome: Progressing Goal: Compliance with treatment plan for underlying cause of condition will improve Outcome: Progressing   Problem: Physical Regulation: Goal: Ability to maintain clinical measurements within normal limits will improve Outcome: Progressing   Problem: Physical Regulation: Goal: Ability to maintain clinical measurements within normal limits will improve Outcome: Progressing   Problem: Safety: Goal: Periods of time without injury will increase Outcome: Progressing   Problem: Education: Goal: Utilization of techniques to improve thought processes will improve Outcome: Progressing Goal: Knowledge of the prescribed therapeutic regimen will improve Outcome: Progressing   Problem: Education: Goal: Utilization of techniques to improve thought processes will improve Outcome: Progressing Goal: Knowledge of the prescribed therapeutic regimen will improve Outcome: Progressing   Problem: Activity: Goal: Interest or engagement in leisure activities will improve Outcome: Progressing Goal: Imbalance in normal sleep/wake cycle will improve Outcome:  Progressing   Problem: Coping: Goal: Coping ability will improve Outcome: Progressing Goal: Will verbalize feelings Outcome: Progressing   Problem: Health Behavior/Discharge Planning: Goal: Ability to make decisions will improve Outcome: Progressing Goal: Compliance with therapeutic regimen will improve Outcome: Progressing   Problem: Role Relationship: Goal: Will demonstrate positive changes in social behaviors and relationships Outcome: Progressing   Problem: Safety: Goal: Ability to disclose and discuss suicidal ideas will improve Outcome: Progressing Goal: Ability to identify and utilize support systems that promote safety will improve Outcome: Progressing   Problem: Self-Concept: Goal: Will verbalize positive feelings about self Outcome: Progressing Goal: Level of anxiety will decrease Outcome: Progressing

## 2021-08-08 NOTE — Progress Notes (Signed)
Spoke with Weston Brass, RRT in respiratory at [phone]. Informed Weston Brass of order for CPAP and follow up request for patient to have CPAP set up. Per Weston Brass. Information will be passed along to night RRT to set up CPAP for tonight.
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
## 2021-08-08 NOTE — B[facility] Counselor (Signed)
CSW contacted [employer], [phone].  CSW spoke with Santina Evans who reports that there needs to be 7 days between admissions.   She reports that the patient "is good to come back" in terms of time.  Patient has not been admitted at this time.   She reports that she will have an Admissions Counselor call this CSW back.   Penni Homans, MSW, LCSW 08/08/2021 11:24 AM

## 2021-08-08 NOTE — B[facility] Counselor (Signed)
CSW received a call with Vickie at [facility], she reports that patient DOES NOT QUALIFY due to length of time since last use of substances.  She reports that they are a dual diagnosis facility, however, substance use has to be primary and the length of time is too great since last use.    Penni Homans, MSW, LCSW 08/08/2021 1:04 PM

## 2021-08-08 NOTE — B[facility] Suicide Risk Assessment (Signed)
[facility] INPATIENT:  Family/Significant Other Suicide Prevention Education  Suicide Prevention Education:  Contact Attempts: Minus Liberty, friend, [phone] has been identified by the patient as the family member/significant other with whom the patient will be residing, and identified as the person(s) who will aid the patient in the event of a mental health crisis.  With written consent from the patient, two attempts were made to provide suicide prevention education, prior to and/or following the patient's discharge.  We were unsuccessful in providing suicide prevention education.  A suicide education pamphlet was given to the patient to share with family/significant other.  Date and time of first attempt:08/08/2021 11:26AM Date and time of second attempt: Second attempt is needed.  CSW left HIPAA compliant voicemail.  Rozann Lesches 08/08/2021, 11:25 AM

## 2021-08-08 NOTE — B[facility] Counselor (Signed)
CSW attempted to contact [facility], Tania Ade, CSW left HIPPA compliant voicemail.   Patient requested that CSW contact the above, his landlord to explain that he is here.  Patient declined to have the conversation himself.   Penni Homans, MSW, LCSW 08/08/2021 11:15 AM

## 2021-08-08 NOTE — Consult Note (Signed)
ORTHOPAEDIC CONSULTATION  REQUESTING PHYSICIAN: Clapacs, [name], MD  Chief Complaint: Right achilles tendon.  HPI: Luis [name] is a 51 y.o. male who complains of  right achilles tendon pain.  X 1 month.  No trauma.  Painful with ambulation but able to walk unassisted.  Past Medical History:  Diagnosis Date   Anxiety    Biventricular ICD (implantable cardioverter-defibrillator) in place    Chronic systolic CHF (congestive heart failure) (HCC)    Cocaine use    Diabetes mellitus without complication (HCC)    GERD (gastroesophageal reflux disease)    HLD (hyperlipidemia)    Hypertension    Morbid obesity (HCC)    NICM (nonischemic cardiomyopathy) (HCC)    OSA (obstructive sleep apnea)    PTSD (post-traumatic stress disorder)    on Depakote   Refusal of blood transfusions as patient is Jehovah's Witness    Tobacco abuse    Past Surgical History:  Procedure Laterality Date   BIV ICD INSERTION CRT-D     FOOT FRACTURE SURGERY     ROTATOR CUFF REPAIR     TONSILLECTOMY     Social History   Socioeconomic History   Marital status: Legally Separated    Spouse name: Not on file   Number of children: Not on file   Years of education: Not on file   Highest education level: Not on file  Occupational History   Not on file  Tobacco Use   Smoking status: Every Day    Types: Cigarettes   Smokeless tobacco: Never   Tobacco comments:    occasionally  Vaping Use   Vaping Use: Never used  Substance and Sexual Activity   Alcohol use: Not Currently   Drug use: Yes    Types: Cocaine   Sexual activity: Not on file  Other Topics Concern   Not on file  Social History Narrative   Not on file   Social Determinants of Health   Financial Resource Strain: Not on file  Food Insecurity: Not on file  Transportation Needs: Not on file  Physical Activity: Not on file  Stress: Not on file  Social Connections: Not on file   Family History  Problem Relation Age of Onset    Hypertension Mother    Bone cancer Father    Lung cancer Father    Allergies  Allergen Reactions   Aspirin Anaphylaxis and Other (See Comments)    Throat closing    Iodine-131 Hives   Iodinated Contrast Media Hives   Latex Hives and Rash   Penicillins Nausea And Vomiting   Tramadol Hives and Nausea Only   Amoxicillin-Pot Clavulanate Diarrhea   Lidocaine Rash   Lisinopril Cough   Prior to Admission medications   Medication Sig Start Date End Date Taking? Authorizing Provider  Artificial Tear Solution (TEARS NATURALE OP) Place 2 drops into both eyes in the morning and at bedtime.    [provider]  buPROPion (WELLBUTRIN XL) 150 MG 24 hr tablet Take 1 tablet (150 mg total) by mouth every morning. 07/29/21 07/29/22  Nwoko, Tommas Olp, PA  carvedilol (COREG) 6.25 MG tablet Take 6.25 mg by mouth 2 (two) times daily. 07/23/21   [provider]  clopidogrel (PLAVIX) 75 MG tablet Take 75 mg by mouth daily. 01/13/19   [provider]  clotrimazole-betamethasone (LOTRISONE) cream Apply 1 application topically in the morning and at bedtime. Applied to affected area. 02/27/21   [provider]  divalproex (DEPAKOTE) 500 MG DR tablet Take  1 tablet (500 mg total) by mouth 2 (two) times daily. 07/29/21   Nwoko, Tommas Olp, PA  DULoxetine (CYMBALTA) 30 MG capsule Take 90 mg by mouth daily. 07/12/21   [provider]  furosemide (LASIX) 80 MG tablet Take 80 mg by mouth daily. 09/13/18   [provider]  HUMALOG MIX 75/25 KWIKPEN (75-25) 100 UNIT/ML Kwikpen Inject 20 Units into the skin 2 (two) times daily before a meal. 10/02/20   Littie Deeds, MD  MELATONIN GUMMIES PO Take 3 tablets by mouth at bedtime.    [provider]  Multiple Vitamin (MULTIVITAMIN WITH MINERALS) TABS tablet Take 1 tablet by mouth daily.    [provider]  oxyCODONE-acetaminophen (PERCOCET) 7.5-325 MG tablet Take 0.5-1 tablets by mouth every 12 (twelve) hours as needed  for severe pain.    [provider]  pantoprazole (PROTONIX) 40 MG tablet Take 40 mg by mouth daily. 01/19/21   [provider]  prazosin (MINIPRESS) 2 MG capsule Take 1 capsule (2 mg total) by mouth at bedtime. 07/29/21   Nwoko, Tommas Olp, PA  ramelteon (ROZEREM) 8 MG tablet Take 1 tablet (8 mg total) by mouth at bedtime. 07/29/21   Nwoko, Tommas Olp, PA  rosuvastatin (CRESTOR) 20 MG tablet Take 20 mg by mouth every evening. 09/21/18   [provider]  sertraline (ZOLOFT) 100 MG tablet Take 2 tablets (200 mg total) by mouth daily. 07/29/21   Nwoko, Tommas Olp, PA  traZODone (DESYREL) 100 MG tablet Take 200 mg by mouth at bedtime. 06/24/21   [provider]  TRULICITY 1.5 MG/0.5ML SOPN Inject 1.5 mg into the skin once a week. 07/15/21   [provider]  VICTOZA 18 MG/3ML SOPN Inject 1.8 mg into the skin daily. 10/02/20 08/05/21  Simmons-Robinson, Tawanna Cooler, MD   No results found.  Positive ROS: All other systems have been reviewed and were otherwise negative with the exception of those mentioned in the HPI and as above.  12 point ROS was performed.  Physical Exam: General: Alert and oriented.  No apparent distress.  Vascular:  Left foot:Dorsalis Pedis:  present Posterior Tibial:  present  Right foot: Dorsalis Pedis:  present Posterior Tibial:  present  Neuro:intact  Derm:NO wounds.  No erythema  Ortho/MS: Focal fusiform edema to achilles tendon.  Tender to palpate.  Good ROM and strength   Assessment: Achilles tendonitis.  Plan: Recommend rest, ice, NSAIDS.  May benefit from Cam boot.  Not sure if allowed on his floor. Not torn.  No need for surgery.  F/U outpt prn.    [name], DPM Cell [phone]   08/08/2021 12:29 PM

## 2021-08-08 NOTE — Plan of Care (Signed)
Problem: Education: Goal: Knowledge of [facility] Education information/materials will improve 08/08/2021 1846 by Angeline Slim, RN Outcome: Progressing 08/08/2021 1131 by Angeline Slim, RN Outcome: Progressing Goal: Emotional status will improve 08/08/2021 1846 by Angeline Slim, RN Outcome: Progressing 08/08/2021 1131 by Angeline Slim, RN Outcome: Progressing Goal: Mental status will improve 08/08/2021 1846 by Angeline Slim, RN Outcome: Progressing 08/08/2021 1131 by Angeline Slim, RN Outcome: Progressing Goal: Verbalization of understanding the information provided will improve 08/08/2021 1846 by Angeline Slim, RN Outcome: Progressing 08/08/2021 1131 by Angeline Slim, RN Outcome: Progressing   Problem: Activity: Goal: Interest or engagement in activities will improve 08/08/2021 1846 by Angeline Slim, RN Outcome: Progressing 08/08/2021 1131 by Angeline Slim, RN Outcome: Progressing Goal: Sleeping patterns will improve 08/08/2021 1846 by Angeline Slim, RN Outcome: Progressing 08/08/2021 1131 by Angeline Slim, RN Outcome: Progressing   Problem: Coping: Goal: Ability to verbalize frustrations and anger appropriately will improve 08/08/2021 1846 by Angeline Slim, RN Outcome: Progressing 08/08/2021 1131 by Angeline Slim, RN Outcome: Progressing Goal: Ability to demonstrate self-control will improve 08/08/2021 1846 by Angeline Slim, RN Outcome: Progressing 08/08/2021 1131 by Angeline Slim, RN Outcome: Progressing   Problem: Health Behavior/Discharge Planning: Goal: Identification of resources available to assist in meeting health care needs will improve 08/08/2021 1846 by Angeline Slim, RN Outcome: Progressing 08/08/2021 1131 by Angeline Slim, RN Outcome: Progressing Goal: Compliance with treatment plan for underlying cause of condition will improve 08/08/2021 1846 by Angeline Slim, RN Outcome:  Progressing 08/08/2021 1131 by Angeline Slim, RN Outcome: Progressing   Problem: Physical Regulation: Goal: Ability to maintain clinical measurements within normal limits will improve 08/08/2021 1846 by Angeline Slim, RN Outcome: Progressing 08/08/2021 1131 by Angeline Slim, RN Outcome: Progressing   Problem: Safety: Goal: Periods of time without injury will increase 08/08/2021 1846 by Angeline Slim, RN Outcome: Progressing 08/08/2021 1131 by Angeline Slim, RN Outcome: Progressing   Problem: Education: Goal: Utilization of techniques to improve thought processes will improve 08/08/2021 1846 by Angeline Slim, RN Outcome: Progressing 08/08/2021 1131 by Angeline Slim, RN Outcome: Progressing Goal: Knowledge of the prescribed therapeutic regimen will improve 08/08/2021 1846 by Angeline Slim, RN Outcome: Progressing 08/08/2021 1131 by Angeline Slim, RN Outcome: Progressing   Problem: Activity: Goal: Interest or engagement in leisure activities will improve 08/08/2021 1846 by Angeline Slim, RN Outcome: Progressing 08/08/2021 1131 by Angeline Slim, RN Outcome: Progressing Goal: Imbalance in normal sleep/wake cycle will improve 08/08/2021 1846 by Angeline Slim, RN Outcome: Progressing 08/08/2021 1131 by Angeline Slim, RN Outcome: Progressing   Problem: Coping: Goal: Coping ability will improve 08/08/2021 1846 by Angeline Slim, RN Outcome: Progressing 08/08/2021 1131 by Angeline Slim, RN Outcome: Progressing Goal: Will verbalize feelings 08/08/2021 1846 by Angeline Slim, RN Outcome: Progressing 08/08/2021 1131 by Angeline Slim, RN Outcome: Progressing   Problem: Health Behavior/Discharge Planning: Goal: Ability to make decisions will improve 08/08/2021 1846 by Angeline Slim, RN Outcome: Progressing 08/08/2021 1131 by Angeline Slim, RN Outcome: Progressing Goal: Compliance with therapeutic regimen will  improve 08/08/2021 1846 by Angeline Slim, RN Outcome: Progressing 08/08/2021 1131 by Angeline Slim, RN Outcome: Progressing   Problem: Role Relationship: Goal: Will demonstrate positive changes in social behaviors and relationships 08/08/2021 1846 by Angeline Slim, RN Outcome: Progressing 08/08/2021 1131 by Angeline Slim, RN Outcome: Progressing   Problem: Safety: Goal: Ability to disclose and discuss suicidal ideas will improve 08/08/2021 1846 by Angeline Slim, RN Outcome: Progressing 08/08/2021 1131 by Angeline Slim, RN Outcome: Progressing Goal: Ability to identify and utilize support systems that promote safety  will improve 08/08/2021 1846 by Angeline Slim, RN Outcome: Progressing 08/08/2021 1131 by Angeline Slim, RN Outcome: Progressing   Problem: Self-Concept: Goal: Will verbalize positive feelings about self 08/08/2021 1846 by Angeline Slim, RN Outcome: Progressing 08/08/2021 1131 by Angeline Slim, RN Outcome: Progressing Goal: Level of anxiety will decrease 08/08/2021 1846 by Angeline Slim, RN Outcome: Progressing 08/08/2021 1131 by Angeline Slim, RN Outcome: Progressing

## 2021-08-08 NOTE — Plan of Care (Signed)
°  Problem: Education: Goal: Knowledge of [facility] Education information/materials will improve Outcome: Progressing Goal: Emotional status will improve Outcome: Progressing Goal: Mental status will improve Outcome: Progressing Goal: Verbalization of understanding the information provided will improve Outcome: Progressing   Problem: Activity: Goal: Interest or engagement in activities will improve Outcome: Progressing Goal: Sleeping patterns will improve Outcome: Progressing   Problem: Coping: Goal: Ability to verbalize frustrations and anger appropriately will improve Outcome: Progressing Goal: Ability to demonstrate self-control will improve Outcome: Progressing   Problem: Education: Goal: Utilization of techniques to improve thought processes will improve Outcome: Progressing Goal: Knowledge of the prescribed therapeutic regimen will improve Outcome: Progressing   Problem: Safety: Goal: Periods of time without injury will increase Outcome: Progressing   Problem: Physical Regulation: Goal: Ability to maintain clinical measurements within normal limits will improve Outcome: Progressing

## 2021-08-08 NOTE — BH IP Treatment Plan (Signed)
Interdisciplinary Treatment and Diagnostic Plan Update  08/08/2021 Time of Session: 9:30AM Luis [name] MRN: [id number]  Principal Diagnosis: MDD (major depressive disorder), recurrent severe, without psychosis ([place])  Secondary Diagnoses: Principal Problem:   MDD (major depressive disorder), recurrent severe, without psychosis ([place]) Active Problems:   Chronic systolic CHF (congestive heart failure) ([place])   Pacemaker   Diabetes mellitus without complication ([place])   Hypertension   GERD (gastroesophageal reflux disease)   Cocaine use disorder ([place])   Sleep apnea   Current Medications:  Current Facility-Administered Medications  Medication Dose Route Frequency Provider Last Rate Last Admin   acetaminophen (TYLENOL) tablet 650 mg  650 mg Oral Q6H PRN Lucky Rathke, FNP   650 mg at 08/07/21 0820   alum & mag hydroxide-simeth (MAALOX/MYLANTA) 200-200-20 MG/5ML suspension 30 mL  30 mL Oral Q4H PRN Lucky Rathke, FNP       buPROPion (WELLBUTRIN XL) 24 hr tablet 300 mg  300 mg Oral Daily Clapacs, John T, MD   300 mg at 08/08/21 0825   carvedilol (COREG) tablet 6.25 mg  6.25 mg Oral BID WC Clapacs, John T, MD   6.25 mg at 08/08/21 [id number]   clopidogrel (PLAVIX) tablet 75 mg  75 mg Oral Daily Clapacs, Madie Reno, MD   75 mg at 08/08/21 0826   DULoxetine (CYMBALTA) DR capsule 90 mg  90 mg Oral Daily Clapacs, Madie Reno, MD   90 mg at 08/08/21 [id number]   hydrOXYzine (ATARAX) tablet 25 mg  25 mg Oral TID PRN Lucky Rathke, FNP       insulin aspart (novoLOG) injection 0-5 Units  0-5 Units Subcutaneous QHS Clapacs, John T, MD   2 Units at 08/07/21 2136   insulin aspart (novoLOG) injection 0-9 Units  0-9 Units Subcutaneous TID WC Clapacs, John T, MD   3 Units at 08/08/21 1159   insulin aspart protamine- aspart (NOVOLOG MIX 70/30) injection 60 Units  60 Units Subcutaneous TID PC Clapacs, John T, MD   60 Units at 08/08/21 1158   magnesium hydroxide (MILK OF MAGNESIA) suspension 30 mL  30 mL Oral Daily PRN  Lucky Rathke, FNP       pantoprazole (PROTONIX) EC tablet 40 mg  40 mg Oral Daily Clapacs, John T, MD   40 mg at 08/08/21 [id number]   prazosin (MINIPRESS) capsule 2 mg  2 mg Oral QHS Clapacs, John T, MD   2 mg at 08/07/21 2135   rosuvastatin (CRESTOR) tablet 20 mg  20 mg Oral Daily Clapacs, John T, MD   20 mg at 08/08/21 0827   traZODone (DESYREL) tablet 200 mg  200 mg Oral QHS Clapacs, John T, MD   200 mg at 08/07/21 2136   PTA Medications: Medications Prior to Admission  Medication Sig Dispense Refill Last Dose   Artificial Tear Solution (TEARS NATURALE OP) Place 2 drops into both eyes in the morning and at bedtime.      buPROPion (WELLBUTRIN XL) 150 MG 24 hr tablet Take 1 tablet (150 mg total) by mouth every morning. 30 tablet 1    carvedilol (COREG) 6.25 MG tablet Take 6.25 mg by mouth 2 (two) times daily.      clopidogrel (PLAVIX) 75 MG tablet Take 75 mg by mouth daily.      clotrimazole-betamethasone (LOTRISONE) cream Apply 1 application topically in the morning and at bedtime. Applied to affected area.      divalproex (DEPAKOTE) 500 MG DR tablet Take 1 tablet (500 mg total)  by mouth 2 (two) times daily. 60 tablet 1    DULoxetine (CYMBALTA) 30 MG capsule Take 90 mg by mouth daily.      furosemide (LASIX) 80 MG tablet Take 80 mg by mouth daily.      HUMALOG MIX 75/25 KWIKPEN (75-25) 100 UNIT/ML Kwikpen Inject 20 Units into the skin 2 (two) times daily before a meal. 15 mL 11    MELATONIN GUMMIES PO Take 3 tablets by mouth at bedtime.      Multiple Vitamin (MULTIVITAMIN WITH MINERALS) TABS tablet Take 1 tablet by mouth daily.      oxyCODONE-acetaminophen (PERCOCET) 7.5-325 MG tablet Take 0.5-1 tablets by mouth every 12 (twelve) hours as needed for severe pain.      pantoprazole (PROTONIX) 40 MG tablet Take 40 mg by mouth daily.      prazosin (MINIPRESS) 2 MG capsule Take 1 capsule (2 mg total) by mouth at bedtime. 30 capsule 1    ramelteon (ROZEREM) 8 MG tablet Take 1 tablet (8 mg total) by  mouth at bedtime. 30 tablet 1    rosuvastatin (CRESTOR) 20 MG tablet Take 20 mg by mouth every evening.      sertraline (ZOLOFT) 100 MG tablet Take 2 tablets (200 mg total) by mouth daily. 60 tablet 1    traZODone (DESYREL) 100 MG tablet Take 200 mg by mouth at bedtime.      TRULICITY 1.5 0000000 SOPN Inject 1.5 mg into the skin once a week.      VICTOZA 18 MG/3ML SOPN Inject 1.8 mg into the skin daily. 9 mL 0     Patient Stressors: Financial difficulties    Patient Strengths: Supportive family/friends   Treatment Modalities: Medication Management, Group therapy, Case management,  1 to 1 session with clinician, Psychoeducation, Recreational therapy.   Physician Treatment Plan for Primary Diagnosis: MDD (major depressive disorder), recurrent severe, without psychosis ([place]) Long Term Goal(s): Improvement in symptoms so as ready for discharge   Short Term Goals: Ability to identify and develop effective coping behaviors will improve Ability to maintain clinical measurements within normal limits will improve Compliance with prescribed medications will improve Ability to disclose and discuss suicidal ideas Ability to demonstrate self-control will improve  Medication Management: Evaluate patient's response, side effects, and tolerance of medication regimen.  Therapeutic Interventions: 1 to 1 sessions, Unit Group sessions and Medication administration.  Evaluation of Outcomes: Progressing  Physician Treatment Plan for Secondary Diagnosis: Principal Problem:   MDD (major depressive disorder), recurrent severe, without psychosis ([place]) Active Problems:   Chronic systolic CHF (congestive heart failure) (HCC)   Pacemaker   Diabetes mellitus without complication (HCC)   Hypertension   GERD (gastroesophageal reflux disease)   Cocaine use disorder (HCC)   Sleep apnea  Long Term Goal(s): Improvement in symptoms so as ready for discharge   Short Term Goals: Ability to identify and develop  effective coping behaviors will improve Ability to maintain clinical measurements within normal limits will improve Compliance with prescribed medications will improve Ability to disclose and discuss suicidal ideas Ability to demonstrate self-control will improve     Medication Management: Evaluate patient's response, side effects, and tolerance of medication regimen.  Therapeutic Interventions: 1 to 1 sessions, Unit Group sessions and Medication administration.  Evaluation of Outcomes: Progressing   RN Treatment Plan for Primary Diagnosis: MDD (major depressive disorder), recurrent severe, without psychosis ([place]) Long Term Goal(s): Knowledge of disease and therapeutic regimen to maintain health will improve  Short Term Goals: Ability to demonstrate  self-control, Ability to participate in decision making will improve, Ability to verbalize feelings will improve, Ability to disclose and discuss suicidal ideas, Ability to identify and develop effective coping behaviors will improve, and Compliance with prescribed medications will improve  Medication Management: RN will administer medications as ordered by provider, will assess and evaluate patient's response and provide education to patient for prescribed medication. RN will report any adverse and/or side effects to prescribing provider.  Therapeutic Interventions: 1 on 1 counseling sessions, Psychoeducation, Medication administration, Evaluate responses to treatment, Monitor vital signs and CBGs as ordered, Perform/monitor CIWA, COWS, AIMS and Fall Risk screenings as ordered, Perform wound care treatments as ordered.  Evaluation of Outcomes: Progressing   LCSW Treatment Plan for Primary Diagnosis: MDD (major depressive disorder), recurrent severe, without psychosis ([place]) Long Term Goal(s): Safe transition to appropriate next level of care at discharge, Engage patient in therapeutic group addressing interpersonal concerns.  Short Term Goals:  Engage patient in aftercare planning with referrals and resources, Increase social support, Increase ability to appropriately verbalize feelings, Increase emotional regulation, Facilitate acceptance of mental health diagnosis and concerns, Facilitate patient progression through stages of change regarding substance use diagnoses and concerns, Identify triggers associated with mental health/substance abuse issues, and Increase skills for wellness and recovery  Therapeutic Interventions: Assess for all discharge needs, 1 to 1 time with Social worker, Explore available resources and support systems, Assess for adequacy in community support network, Educate family and significant other(s) on suicide prevention, Complete Psychosocial Assessment, Interpersonal group therapy.  Evaluation of Outcomes: Progressing   Progress in Treatment: Attending groups: No. Participating in groups: No. Taking medication as prescribed: Yes. Toleration medication: Yes. Family/Significant other contact made: Yes, individual(s) contacted:  collateral contacts have been attempted.  Patient understands diagnosis: Yes. Discussing patient identified problems/goals with staff: Yes. Medical problems stabilized or resolved: Yes. Denies suicidal/homicidal ideation: Yes. Issues/concerns per patient self-inventory: No. Other: none  New problem(s) identified: No, Describe:  none  New Short Term/Long Term Goal(s): detox, medication management for mood stabilization; elimination of SI thoughts; development of comprehensive mental wellness/sobriety plan.   Patient Goals:  "my depression, my anger, getting back on track and stop isolating"  Discharge Plan or Barriers: Patient reports that he would like to follow up with [facility].  CSW has contacted [facility] and is awaiting a call back in regards to possible admission.   Reason for Continuation of Hospitalization: Anxiety Depression Medication  stabilization Suicidal ideation  Estimated Length of Stay:  1-7 days   Scribe for Treatment Team: Rozann Lesches, Marlinda Mike 08/08/2021 12:02 PM

## 2021-08-08 NOTE — Progress Notes (Signed)
[facility] MD Progress Note  08/08/2021 5:16 PM Luis [name]  MRN:  [id number] Subjective: Follow-up for patient with major depression.  Patient reports he continues to be very sad and depressed.  Passive suicidal thoughts.  Feelings of hopelessness.  He is wanting to discuss ECT as a treatment option.  Patient has been out of his room interacting and cooperative today but does have a very depressed look about him.  Blood sugars are continuing to run high. Principal Problem: MDD (major depressive disorder), recurrent severe, without psychosis ([place]) Diagnosis: Principal Problem:   MDD (major depressive disorder), recurrent severe, without psychosis ([place]) Active Problems:   Chronic systolic CHF (congestive heart failure) (HCC)   Pacemaker   Diabetes mellitus without complication (HCC)   Hypertension   GERD (gastroesophageal reflux disease)   Cocaine use disorder (HCC)   Sleep apnea  Total Time spent with patient: 30 minutes  Past Psychiatric History: Past history of recurrent severe chronic depression and substance abuse as well as multiple medical problems  Past Medical History:  Past Medical History:  Diagnosis Date   Anxiety    Biventricular ICD (implantable cardioverter-defibrillator) in place    Chronic systolic CHF (congestive heart failure) ([place])    Cocaine use    Diabetes mellitus without complication (HCC)    GERD (gastroesophageal reflux disease)    HLD (hyperlipidemia)    Hypertension    Morbid obesity (HCC)    NICM (nonischemic cardiomyopathy) (HCC)    OSA (obstructive sleep apnea)    PTSD (post-traumatic stress disorder)    on Depakote   Refusal of blood transfusions as patient is Jehovah's Witness    Tobacco abuse     Past Surgical History:  Procedure Laterality Date   BIV ICD INSERTION CRT-D     FOOT FRACTURE SURGERY     ROTATOR CUFF REPAIR     TONSILLECTOMY     Family History:  Family History  Problem Relation Age of Onset   Hypertension Mother     Bone cancer Father    Lung cancer Father    Family Psychiatric  History: See previous Social History:  Social History   Substance and Sexual Activity  Alcohol Use Not Currently     Social History   Substance and Sexual Activity  Drug Use Yes   Types: Cocaine    Social History   Socioeconomic History   Marital status: Legally Separated    Spouse name: Not on file   Number of children: Not on file   Years of education: Not on file   Highest education level: Not on file  Occupational History   Not on file  Tobacco Use   Smoking status: Every Day    Types: Cigarettes   Smokeless tobacco: Never   Tobacco comments:    occasionally  Vaping Use   Vaping Use: Never used  Substance and Sexual Activity   Alcohol use: Not Currently   Drug use: Yes    Types: Cocaine   Sexual activity: Not on file  Other Topics Concern   Not on file  Social History Narrative   Not on file   Social Determinants of Health   Financial Resource Strain: Not on file  Food Insecurity: Not on file  Transportation Needs: Not on file  Physical Activity: Not on file  Stress: Not on file  Social Connections: Not on file   Additional Social History:  Sleep: Fair  Appetite:  Fair  Current Medications: Current Facility-Administered Medications  Medication Dose Route Frequency Provider Last Rate Last Admin   acetaminophen (TYLENOL) tablet 650 mg  650 mg Oral Q6H PRN Lucky Rathke, FNP   650 mg at 08/07/21 0820   alum & mag hydroxide-simeth (MAALOX/MYLANTA) 200-200-20 MG/5ML suspension 30 mL  30 mL Oral Q4H PRN Lucky Rathke, FNP       buPROPion (WELLBUTRIN XL) 24 hr tablet 300 mg  300 mg Oral Daily Lauran Romanski T, MD   300 mg at 08/08/21 0825   carvedilol (COREG) tablet 6.25 mg  6.25 mg Oral BID WC Lem Peary, Madie Reno, MD   6.25 mg at 08/08/21 [id number]   clopidogrel (PLAVIX) tablet 75 mg  75 mg Oral Daily Jaleena Viviani, Madie Reno, MD   75 mg at 08/08/21 [id number]   DULoxetine  (CYMBALTA) DR capsule 90 mg  90 mg Oral Daily August Longest, Madie Reno, MD   90 mg at 08/08/21 [id number]   hydrOXYzine (ATARAX) tablet 25 mg  25 mg Oral TID PRN Lucky Rathke, FNP       insulin aspart (novoLOG) injection 0-5 Units  0-5 Units Subcutaneous QHS Atziri Zubiate T, MD   2 Units at 08/07/21 2136   insulin aspart (novoLOG) injection 0-9 Units  0-9 Units Subcutaneous TID WC Raiza Kiesel T, MD   3 Units at 08/08/21 1159   insulin aspart protamine- aspart (NOVOLOG MIX 70/30) injection 60 Units  60 Units Subcutaneous TID PC Damarion Mendizabal T, MD   60 Units at 08/08/21 1158   magnesium hydroxide (MILK OF MAGNESIA) suspension 30 mL  30 mL Oral Daily PRN Lucky Rathke, FNP       pantoprazole (PROTONIX) EC tablet 40 mg  40 mg Oral Daily Vaniah Chambers T, MD   40 mg at 08/08/21 [id number]   prazosin (MINIPRESS) capsule 2 mg  2 mg Oral QHS Lamiracle Chaidez T, MD   2 mg at 08/07/21 2135   rosuvastatin (CRESTOR) tablet 20 mg  20 mg Oral Daily Shirla Hodgkiss T, MD   20 mg at 08/08/21 0827   traZODone (DESYREL) tablet 200 mg  200 mg Oral QHS Norabelle Kondo T, MD   200 mg at 08/07/21 2136    Lab Results:  Results for orders placed or performed during the hospital encounter of 08/06/21 (from the past 48 hour(s))  Glucose, capillary     Status: Abnormal   Collection Time: 08/06/21  5:24 PM  Result Value Ref Range   Glucose-Capillary 180 (H) 70 - 99 mg/dL    Comment: Glucose reference range applies only to samples taken after fasting for at least 8 hours.  Glucose, capillary     Status: Abnormal   Collection Time: 08/06/21  8:53 PM  Result Value Ref Range   Glucose-Capillary 335 (H) 70 - 99 mg/dL    Comment: Glucose reference range applies only to samples taken after fasting for at least 8 hours.  Glucose, capillary     Status: Abnormal   Collection Time: 08/07/21  6:55 AM  Result Value Ref Range   Glucose-Capillary 286 (H) 70 - 99 mg/dL    Comment: Glucose reference range applies only to samples taken after fasting for at  least 8 hours.  Glucose, capillary     Status: Abnormal   Collection Time: 08/07/21 11:13 AM  Result Value Ref Range   Glucose-Capillary 206 (H) 70 - 99 mg/dL    Comment: Glucose reference range applies only to  samples taken after fasting for at least 8 hours.   Comment 1 Notify RN   Glucose, capillary     Status: Abnormal   Collection Time: 08/07/21  4:18 PM  Result Value Ref Range   Glucose-Capillary 177 (H) 70 - 99 mg/dL    Comment: Glucose reference range applies only to samples taken after fasting for at least 8 hours.  Glucose, capillary     Status: Abnormal   Collection Time: 08/07/21  9:28 PM  Result Value Ref Range   Glucose-Capillary 201 (H) 70 - 99 mg/dL    Comment: Glucose reference range applies only to samples taken after fasting for at least 8 hours.  Glucose, capillary     Status: Abnormal   Collection Time: 08/08/21  6:35 AM  Result Value Ref Range   Glucose-Capillary 232 (H) 70 - 99 mg/dL    Comment: Glucose reference range applies only to samples taken after fasting for at least 8 hours.  Glucose, capillary     Status: Abnormal   Collection Time: 08/08/21 11:51 AM  Result Value Ref Range   Glucose-Capillary 228 (H) 70 - 99 mg/dL    Comment: Glucose reference range applies only to samples taken after fasting for at least 8 hours.  Glucose, capillary     Status: Abnormal   Collection Time: 08/08/21  4:27 PM  Result Value Ref Range   Glucose-Capillary 299 (H) 70 - 99 mg/dL    Comment: Glucose reference range applies only to samples taken after fasting for at least 8 hours.    Blood Alcohol level:  Lab Results  Component Value Date   ETH <10 08/05/2021   ETH <10 01/27/2021    Metabolic Disorder Labs: Lab Results  Component Value Date   HGBA1C 10.0 (H) 08/05/2021   MPG 240.3 08/05/2021   MPG 205.86 01/27/2021   No results found for: PROLACTIN Lab Results  Component Value Date   CHOL 143 08/05/2021   TRIG 245 (H) 08/05/2021   HDL 33 (L) 08/05/2021    CHOLHDL 4.3 08/05/2021   VLDL 49 (H) 08/05/2021   LDLCALC 61 08/05/2021   LDLCALC 71 01/27/2021    Physical Findings: AIMS:  , ,  ,  ,    CIWA:    COWS:     Musculoskeletal: Strength & Muscle Tone: within normal limits Gait & Station: normal Patient leans: N/A  Psychiatric Specialty Exam:  Presentation  General Appearance: Disheveled  Eye Contact:Minimal  Speech:Clear and Coherent; Normal Rate  Speech Volume:Normal  Handedness:Right   Mood and Affect  Mood:Irritable  Affect:Congruent; Depressed   Thought Process  Thought Processes:Coherent  Descriptions of Associations:Intact  Orientation:Full (Time, Place and Person)  Thought Content:Logical  History of Schizophrenia/Schizoaffective disorder:No  Duration of Psychotic Symptoms:No data recorded Hallucinations:No data recorded Ideas of Reference:None  Suicidal Thoughts:No data recorded Homicidal Thoughts:No data recorded  Sensorium  Memory:Immediate Good; Recent Good; Remote Good  Judgment:Poor  Insight:Poor   Executive Functions  Concentration:Good  Attention Span:Good  Recall:Good  Fund of Knowledge:Good  Language:Good   Psychomotor Activity  Psychomotor Activity:No data recorded  Assets  Assets:Communication Skills; Desire for Improvement; Housing; Intimacy; Leisure Time; Physical Health; Resilience; Social Support   Sleep  Sleep:No data recorded   Physical Exam: Physical Exam Vitals and nursing note reviewed.  Constitutional:      Appearance: Normal appearance.  HENT:     Head: Normocephalic and atraumatic.     Mouth/Throat:     Pharynx: Oropharynx is clear.  Eyes:  Pupils: Pupils are equal, round, and reactive to light.  Cardiovascular:     Rate and Rhythm: Normal rate and regular rhythm.  Pulmonary:     Effort: Pulmonary effort is normal.     Breath sounds: Normal breath sounds.  Abdominal:     General: Abdomen is flat.     Palpations: Abdomen is soft.   Musculoskeletal:        General: Normal range of motion.  Skin:    General: Skin is warm and dry.  Neurological:     General: No focal deficit present.     Mental Status: He is alert. Mental status is at baseline.  Psychiatric:        Attention and Perception: Attention normal.        Mood and Affect: Mood is depressed.        Speech: Speech is delayed.        Behavior: Behavior is slowed.        Thought Content: Thought content includes suicidal ideation.   Review of Systems  Constitutional: Negative.   HENT: Negative.    Eyes: Negative.   Respiratory: Negative.    Cardiovascular: Negative.   Gastrointestinal: Negative.   Musculoskeletal: Negative.   Skin: Negative.   Neurological: Negative.   Psychiatric/Behavioral:  Positive for depression, substance abuse and suicidal ideas. The patient is nervous/anxious.   Blood pressure 121/76, pulse 95, temperature 98.1 F (36.7 C), temperature source Oral, resp. rate 17, height 5\' 5"  (1.651 m), weight 123.4 kg, SpO2 94 %. Body mass index is 45.26 kg/m.   Treatment Plan Summary: Medication management and Plan no change to current antidepressant medication.  Patient is agreeable to ECT and I will talk further with him over the weekend but we will plan on beginning ECT with right unilateral treatment on Monday.  Continue with sliding scale and treatment of blood sugars.  Supportive counseling and therapy and psychoeducation completed  Alethia Berthold, MD 08/08/2021, 5:16 PM

## 2021-08-08 NOTE — Progress Notes (Signed)
D: Pt alert and oriented. Pt rates depression, hopelessness and anxiety  8/10.  Reports feeling down but better this shift. Pt reports not sleep well last night and needing CPAP machine MD aware and RN to follow up with respiratory. See separate note for details. Pt reports experiencing any pain this am. See assessment for details.  See pain assessment for details.  Pt denies experiencing any SI/HI, or AVH at this time.    A: Scheduled medications administered to pt, per MD orders. Blood sugars and vital signs monitored per protocol.  Support and encouragement provided. Frequent verbal contact made. Routine safety checks conducted q15 minutes.    R: No adverse drug reactions noted. Pt verbally contracts for safety at this time. Pt complaint with medications and treatment plan. Pt interacts well with others on the unit. Pt remains safe at this time. Will continue to monitor.

## 2021-08-09 DIAGNOSIS — F332 Major depressive disorder, recurrent severe without psychotic features: Secondary | ICD-10-CM | POA: Diagnosis not present

## 2021-08-09 LAB — GLUCOSE, CAPILLARY
Glucose-Capillary: 194 mg/dL — ABNORMAL HIGH (ref 70–99)
Glucose-Capillary: 164 mg/dL — ABNORMAL HIGH (ref 70–99)
Glucose-Capillary: 234 mg/dL — ABNORMAL HIGH (ref 70–99)
Glucose-Capillary: 116 mg/dL — ABNORMAL HIGH (ref 70–99)

## 2021-08-09 MED ORDER — IBUPROFEN 600 MG PO TABS
600.0000 mg | ORAL_TABLET | Freq: Four times a day (QID) | ORAL | Status: DC | PRN
  Filled 2021-08-09: qty 1

## 2021-08-09 NOTE — Progress Notes (Signed)
Patient active on the unit. He has been in the dayroom all day watching TV and interacting well with others on the unit.  He denies si  hi avh at this encounter.  He does continue to endorse depression and anxiety, but says that he is doing better overall.  Blood sugar this evening was 199 and no coverage was needed.    Will continue to monitor with q15 minute safety checks.       C [name]-Nicholson, LPN

## 2021-08-09 NOTE — Progress Notes (Signed)
Patient oriented to unit. Patient rates anxiety and depression 10/10. Patient rates pain 9/10. PRN tylenol was provided to patient. Patient denies SI/HI/AVH. Patient isolative during shift with exception to coming out for meals and scheduled medications.  Patient compliant with medication administration.  Q15 minute safety checks maintained. Patient remains safe on the unit at this time.
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
## 2021-08-09 NOTE — Progress Notes (Signed)
[facility] MD Progress Note  08/09/2021 3:45 PM Luis [name]  MRN:  [id number] Subjective: Follow-up for patient with depression.  Patient says he is still feeling very sad down and depressed.  Feels like not doing anything.  Very fatigued.  Also having a lot of aching and pains.  He did get the boot for his Achilles tendon and that is helping.  Continues to be agreeable to a plan for ECT Principal Problem: MDD (major depressive disorder), recurrent severe, without psychosis ([place]) Diagnosis: Principal Problem:   MDD (major depressive disorder), recurrent severe, without psychosis ([place]) Active Problems:   Chronic systolic CHF (congestive heart failure) ([place])   Pacemaker   Diabetes mellitus without complication (HCC)   Hypertension   GERD (gastroesophageal reflux disease)   Cocaine use disorder (HCC)   Sleep apnea  Total Time spent with patient: 30 minutes  Past Psychiatric History: Past history of severe recurrent depression and substance abuse  Past Medical History:  Past Medical History:  Diagnosis Date   Anxiety    Biventricular ICD (implantable cardioverter-defibrillator) in place    Chronic systolic CHF (congestive heart failure) ([place])    Cocaine use    Diabetes mellitus without complication (HCC)    GERD (gastroesophageal reflux disease)    HLD (hyperlipidemia)    Hypertension    Morbid obesity (HCC)    NICM (nonischemic cardiomyopathy) (HCC)    OSA (obstructive sleep apnea)    PTSD (post-traumatic stress disorder)    on Depakote   Refusal of blood transfusions as patient is Jehovah's Witness    Tobacco abuse     Past Surgical History:  Procedure Laterality Date   BIV ICD INSERTION CRT-D     FOOT FRACTURE SURGERY     ROTATOR CUFF REPAIR     TONSILLECTOMY     Family History:  Family History  Problem Relation Age of Onset   Hypertension Mother    Bone cancer Father    Lung cancer Father    Family Psychiatric  History: See previous Social History:  Social  History   Substance and Sexual Activity  Alcohol Use Not Currently     Social History   Substance and Sexual Activity  Drug Use Yes   Types: Cocaine    Social History   Socioeconomic History   Marital status: Legally Separated    Spouse name: Not on file   Number of children: Not on file   Years of education: Not on file   Highest education level: Not on file  Occupational History   Not on file  Tobacco Use   Smoking status: Every Day    Types: Cigarettes   Smokeless tobacco: Never   Tobacco comments:    occasionally  Vaping Use   Vaping Use: Never used  Substance and Sexual Activity   Alcohol use: Not Currently   Drug use: Yes    Types: Cocaine   Sexual activity: Not on file  Other Topics Concern   Not on file  Social History Narrative   Not on file   Social Determinants of Health   Financial Resource Strain: Not on file  Food Insecurity: Not on file  Transportation Needs: Not on file  Physical Activity: Not on file  Stress: Not on file  Social Connections: Not on file   Additional Social History:                         Sleep: Fair  Appetite:  Fair  Current Medications: Current Facility-Administered Medications  Medication Dose Route Frequency Provider Last Rate Last Admin   acetaminophen (TYLENOL) tablet 650 mg  650 mg Oral Q6H PRN Lucky Rathke, FNP   650 mg at 08/07/21 0820   alum & mag hydroxide-simeth (MAALOX/MYLANTA) 200-200-20 MG/5ML suspension 30 mL  30 mL Oral Q4H PRN Lucky Rathke, FNP       buPROPion (WELLBUTRIN XL) 24 hr tablet 300 mg  300 mg Oral Daily Tyshay Adee T, MD   300 mg at 08/09/21 0842   carvedilol (COREG) tablet 6.25 mg  6.25 mg Oral BID WC Khayman Kirsch, Madie Reno, MD   6.25 mg at 08/09/21 0843   clopidogrel (PLAVIX) tablet 75 mg  75 mg Oral Daily Megham Dwyer, Madie Reno, MD   75 mg at 08/09/21 0842   DULoxetine (CYMBALTA) DR capsule 90 mg  90 mg Oral Daily Mannie Wineland, Madie Reno, MD   90 mg at 08/09/21 0842   hydrOXYzine (ATARAX) tablet  25 mg  25 mg Oral TID PRN Lucky Rathke, FNP   25 mg at 08/08/21 2237   insulin aspart (novoLOG) injection 0-5 Units  0-5 Units Subcutaneous QHS Wendy Mikles T, MD   2 Units at 08/07/21 2136   insulin aspart (novoLOG) injection 0-9 Units  0-9 Units Subcutaneous TID WC Rheagan Nayak T, MD   2 Units at 08/09/21 1307   insulin aspart protamine- aspart (NOVOLOG MIX 70/30) injection 60 Units  60 Units Subcutaneous TID PC Chamille Werntz T, MD   60 Units at 08/09/21 1307   magnesium hydroxide (MILK OF MAGNESIA) suspension 30 mL  30 mL Oral Daily PRN Lucky Rathke, FNP       pantoprazole (PROTONIX) EC tablet 40 mg  40 mg Oral Daily Treshon Stannard, Madie Reno, MD   40 mg at 08/09/21 [id number]   prazosin (MINIPRESS) capsule 2 mg  2 mg Oral QHS Beldon Nowling T, MD   2 mg at 08/08/21 2235   rosuvastatin (CRESTOR) tablet 20 mg  20 mg Oral Daily Dianne Whelchel, Madie Reno, MD   20 mg at 08/09/21 [id number]   traZODone (DESYREL) tablet 200 mg  200 mg Oral QHS Aitan Rossbach T, MD   200 mg at 08/08/21 2235    Lab Results:  Results for orders placed or performed during the hospital encounter of 08/06/21 (from the past 48 hour(s))  Glucose, capillary     Status: Abnormal   Collection Time: 08/07/21  4:18 PM  Result Value Ref Range   Glucose-Capillary 177 (H) 70 - 99 mg/dL    Comment: Glucose reference range applies only to samples taken after fasting for at least 8 hours.  Glucose, capillary     Status: Abnormal   Collection Time: 08/07/21  9:28 PM  Result Value Ref Range   Glucose-Capillary 201 (H) 70 - 99 mg/dL    Comment: Glucose reference range applies only to samples taken after fasting for at least 8 hours.  Glucose, capillary     Status: Abnormal   Collection Time: 08/08/21  6:35 AM  Result Value Ref Range   Glucose-Capillary 232 (H) 70 - 99 mg/dL    Comment: Glucose reference range applies only to samples taken after fasting for at least 8 hours.  Glucose, capillary     Status: Abnormal   Collection Time: 08/08/21 11:51 AM   Result Value Ref Range   Glucose-Capillary 228 (H) 70 - 99 mg/dL    Comment: Glucose reference range applies only to samples taken after fasting  for at least 8 hours.  Glucose, capillary     Status: Abnormal   Collection Time: 08/08/21  4:27 PM  Result Value Ref Range   Glucose-Capillary 299 (H) 70 - 99 mg/dL    Comment: Glucose reference range applies only to samples taken after fasting for at least 8 hours.  Glucose, capillary     Status: Abnormal   Collection Time: 08/08/21  8:30 PM  Result Value Ref Range   Glucose-Capillary 199 (H) 70 - 99 mg/dL    Comment: Glucose reference range applies only to samples taken after fasting for at least 8 hours.  Glucose, capillary     Status: Abnormal   Collection Time: 08/09/21  6:38 AM  Result Value Ref Range   Glucose-Capillary 194 (H) 70 - 99 mg/dL    Comment: Glucose reference range applies only to samples taken after fasting for at least 8 hours.  Glucose, capillary     Status: Abnormal   Collection Time: 08/09/21 12:27 PM  Result Value Ref Range   Glucose-Capillary 164 (H) 70 - 99 mg/dL    Comment: Glucose reference range applies only to samples taken after fasting for at least 8 hours.   Comment 1 Notify RN     Blood Alcohol level:  Lab Results  Component Value Date   ETH <10 08/05/2021   ETH <10 123456    Metabolic Disorder Labs: Lab Results  Component Value Date   HGBA1C 10.0 (H) 08/05/2021   MPG 240.3 08/05/2021   MPG 205.86 01/27/2021   No results found for: PROLACTIN Lab Results  Component Value Date   CHOL 143 08/05/2021   TRIG 245 (H) 08/05/2021   HDL 33 (L) 08/05/2021   CHOLHDL 4.3 08/05/2021   VLDL 49 (H) 08/05/2021   LDLCALC 61 08/05/2021   LDLCALC 71 01/27/2021    Physical Findings: AIMS:  , ,  ,  ,    CIWA:    COWS:     Musculoskeletal: Strength & Muscle Tone: within normal limits Gait & Station: normal Patient leans: N/A  Psychiatric Specialty Exam:  Presentation  General Appearance:  Disheveled  Eye Contact:Minimal  Speech:Clear and Coherent; Normal Rate  Speech Volume:Normal  Handedness:Right   Mood and Affect  Mood:Irritable  Affect:Congruent; Depressed   Thought Process  Thought Processes:Coherent  Descriptions of Associations:Intact  Orientation:Full (Time, Place and Person)  Thought Content:Logical  History of Schizophrenia/Schizoaffective disorder:No  Duration of Psychotic Symptoms:No data recorded Hallucinations:No data recorded Ideas of Reference:None  Suicidal Thoughts:No data recorded Homicidal Thoughts:No data recorded  Sensorium  Memory:Immediate Good; Recent Good; Remote Good  Judgment:Poor  Insight:Poor   Executive Functions  Concentration:Good  Attention Span:Good  [place] of Knowledge:Good  Language:Good   Psychomotor Activity  Psychomotor Activity:No data recorded  Assets  Assets:Communication Skills; Desire for Improvement; Housing; Intimacy; Leisure Time; Physical Health; Resilience; Social Support   Sleep  Sleep:No data recorded   Physical Exam: Physical Exam Vitals and nursing note reviewed.  Constitutional:      Appearance: Normal appearance.  HENT:     Head: Normocephalic and atraumatic.     Mouth/Throat:     Pharynx: Oropharynx is clear.  Eyes:     Pupils: Pupils are equal, round, and reactive to light.  Cardiovascular:     Rate and Rhythm: Normal rate and regular rhythm.  Pulmonary:     Effort: Pulmonary effort is normal.     Breath sounds: Normal breath sounds.  Abdominal:     General: Abdomen is  flat.     Palpations: Abdomen is soft.  Musculoskeletal:        General: Normal range of motion.     Comments: Pain in the right Achilles tendon  Skin:    General: Skin is warm and dry.  Neurological:     General: No focal deficit present.     Mental Status: He is alert. Mental status is at baseline.  Psychiatric:        Attention and Perception: Attention normal.         Mood and Affect: Mood is depressed. Affect is blunt.        Speech: Speech is delayed.        Behavior: Behavior is slowed.        Thought Content: Thought content includes suicidal ideation. Thought content does not include suicidal plan.   Review of Systems  Constitutional: Negative.   HENT: Negative.    Eyes: Negative.   Respiratory: Negative.    Cardiovascular: Negative.   Gastrointestinal: Negative.   Musculoskeletal: Negative.   Skin: Negative.   Neurological: Negative.   Psychiatric/Behavioral:  Positive for depression and suicidal ideas.   Blood pressure 137/71, pulse 92, temperature 98.3 F (36.8 C), temperature source Oral, resp. rate 18, height 5\' 5"  (1.651 m), weight 123.4 kg, SpO2 96 %. Body mass index is 45.26 kg/m.   Treatment Plan Summary: Plan continue current medication but with a plan for beginning ECT on Monday.  Patient confirmed for me that he is not allergic to nonsteroidal pain medications and has taken such medicines as Advil and Aleve in the past without complications so I will order that for pain.  Alethia Berthold, MD 08/09/2021, 3:45 PM

## 2021-08-10 ENCOUNTER — Inpatient Hospital Stay: Payer: Medicare Other

## 2021-08-10 ENCOUNTER — Other Ambulatory Visit: Payer: Self-pay | Admitting: Psychiatry

## 2021-08-10 DIAGNOSIS — F332 Major depressive disorder, recurrent severe without psychotic features: Secondary | ICD-10-CM | POA: Diagnosis not present

## 2021-08-10 LAB — GLUCOSE, CAPILLARY
Glucose-Capillary: 191 mg/dL — ABNORMAL HIGH (ref 70–99)
Glucose-Capillary: 237 mg/dL — ABNORMAL HIGH (ref 70–99)

## 2021-08-10 MED ORDER — FUROSEMIDE 80 MG PO TABS
80.0000 mg | ORAL_TABLET | Freq: Every day | ORAL | Status: DC
  Administered 2021-08-12 – 2021-08-22 (×11): 80 mg via ORAL
  Filled 2021-08-10 (×13): qty 1

## 2021-08-10 NOTE — Progress Notes (Signed)
[facility] MD Progress Note  08/10/2021 11:30 AM Luis [name]  MRN:  [id number] Subjective: Follow-up for this 51 year old man with major depression and heart failure and diabetes.  Patient continues to endorse being very depressed.  Feeling down and sad and negative with suicidal ideation.  He points out that he is starting to have some edema in his feet which he says is because he has not gotten his Lasix which he was taking regularly at home. Principal Problem: MDD (major depressive disorder), recurrent severe, without psychosis ([place]) Diagnosis: Principal Problem:   MDD (major depressive disorder), recurrent severe, without psychosis ([place]) Active Problems:   Chronic systolic CHF (congestive heart failure) (HCC)   Pacemaker   Diabetes mellitus without complication (HCC)   Hypertension   GERD (gastroesophageal reflux disease)   Cocaine use disorder ([place])   Sleep apnea  Total Time spent with patient: 30 minutes  Past Psychiatric History: Past history of recurrent severe chronic depression  Past Medical History:  Past Medical History:  Diagnosis Date   Anxiety    Biventricular ICD (implantable cardioverter-defibrillator) in place    Chronic systolic CHF (congestive heart failure) ([place])    Cocaine use    Diabetes mellitus without complication (HCC)    GERD (gastroesophageal reflux disease)    HLD (hyperlipidemia)    Hypertension    Morbid obesity (HCC)    NICM (nonischemic cardiomyopathy) (HCC)    OSA (obstructive sleep apnea)    PTSD (post-traumatic stress disorder)    on Depakote   Refusal of blood transfusions as patient is Jehovah's Witness    Tobacco abuse     Past Surgical History:  Procedure Laterality Date   BIV ICD INSERTION CRT-D     FOOT FRACTURE SURGERY     ROTATOR CUFF REPAIR     TONSILLECTOMY     Family History:  Family History  Problem Relation Age of Onset   Hypertension Mother    Bone cancer Father    Lung cancer Father    Family Psychiatric   History: See previous Social History:  Social History   Substance and Sexual Activity  Alcohol Use Not Currently     Social History   Substance and Sexual Activity  Drug Use Yes   Types: Cocaine    Social History   Socioeconomic History   Marital status: Legally Separated    Spouse name: Not on file   Number of children: Not on file   Years of education: Not on file   Highest education level: Not on file  Occupational History   Not on file  Tobacco Use   Smoking status: Every Day    Types: Cigarettes   Smokeless tobacco: Never   Tobacco comments:    occasionally  Vaping Use   Vaping Use: Never used  Substance and Sexual Activity   Alcohol use: Not Currently   Drug use: Yes    Types: Cocaine   Sexual activity: Not on file  Other Topics Concern   Not on file  Social History Narrative   Not on file   Social Determinants of Health   Financial Resource Strain: Not on file  Food Insecurity: Not on file  Transportation Needs: Not on file  Physical Activity: Not on file  Stress: Not on file  Social Connections: Not on file   Additional Social History:                         Sleep: Fair  Appetite:  Fair  Current Medications: Current Facility-Administered Medications  Medication Dose Route Frequency Provider Last Rate Last Admin   acetaminophen (TYLENOL) tablet 650 mg  650 mg Oral Q6H PRN Lucky Rathke, FNP   650 mg at 08/09/21 1735   alum & mag hydroxide-simeth (MAALOX/MYLANTA) 200-200-20 MG/5ML suspension 30 mL  30 mL Oral Q4H PRN Lucky Rathke, FNP       buPROPion (WELLBUTRIN XL) 24 hr tablet 300 mg  300 mg Oral Daily Lisia Westbay T, MD   300 mg at 08/10/21 0851   carvedilol (COREG) tablet 6.25 mg  6.25 mg Oral BID WC Brance Dartt, Madie Reno, MD   6.25 mg at 08/10/21 [id number]   clopidogrel (PLAVIX) tablet 75 mg  75 mg Oral Daily Safwan Tomei, Madie Reno, MD   75 mg at 08/10/21 0851   DULoxetine (CYMBALTA) DR capsule 90 mg  90 mg Oral Daily Rocklin Soderquist, Madie Reno, MD   90 mg  at 08/10/21 0851   furosemide (LASIX) tablet 80 mg  80 mg Oral Daily Adelai Achey T, MD       hydrOXYzine (ATARAX) tablet 25 mg  25 mg Oral TID PRN Lucky Rathke, FNP   25 mg at 08/09/21 2139   ibuprofen (ADVIL) tablet 600 mg  600 mg Oral Q6H PRN Alphonza Tramell T, MD       insulin aspart (novoLOG) injection 0-5 Units  0-5 Units Subcutaneous QHS Frantz Quattrone T, MD   2 Units at 08/07/21 2136   insulin aspart (novoLOG) injection 0-9 Units  0-9 Units Subcutaneous TID WC Javonne Dorko, Madie Reno, MD   2 Units at 08/10/21 0849   insulin aspart protamine- aspart (NOVOLOG MIX 70/30) injection 60 Units  60 Units Subcutaneous TID PC Anayah Arvanitis T, MD   60 Units at 08/10/21 0850   magnesium hydroxide (MILK OF MAGNESIA) suspension 30 mL  30 mL Oral Daily PRN Lucky Rathke, FNP       pantoprazole (PROTONIX) EC tablet 40 mg  40 mg Oral Daily Din Bookwalter, Madie Reno, MD   40 mg at 08/10/21 0851   prazosin (MINIPRESS) capsule 2 mg  2 mg Oral QHS Tajanay Hurley T, MD   2 mg at 08/09/21 2139   rosuvastatin (CRESTOR) tablet 20 mg  20 mg Oral Daily Narmeen Kerper T, MD   20 mg at 08/10/21 0855   traZODone (DESYREL) tablet 200 mg  200 mg Oral QHS Radonna Bracher T, MD   200 mg at 08/09/21 2138    Lab Results:  Results for orders placed or performed during the hospital encounter of 08/06/21 (from the past 48 hour(s))  Glucose, capillary     Status: Abnormal   Collection Time: 08/08/21 11:51 AM  Result Value Ref Range   Glucose-Capillary 228 (H) 70 - 99 mg/dL    Comment: Glucose reference range applies only to samples taken after fasting for at least 8 hours.  Glucose, capillary     Status: Abnormal   Collection Time: 08/08/21  4:27 PM  Result Value Ref Range   Glucose-Capillary 299 (H) 70 - 99 mg/dL    Comment: Glucose reference range applies only to samples taken after fasting for at least 8 hours.  Glucose, capillary     Status: Abnormal   Collection Time: 08/08/21  8:30 PM  Result Value Ref Range   Glucose-Capillary 199 (H)  70 - 99 mg/dL    Comment: Glucose reference range applies only to samples taken after fasting for at least 8 hours.  Glucose,  capillary     Status: Abnormal   Collection Time: 08/09/21  6:38 AM  Result Value Ref Range   Glucose-Capillary 194 (H) 70 - 99 mg/dL    Comment: Glucose reference range applies only to samples taken after fasting for at least 8 hours.  Glucose, capillary     Status: Abnormal   Collection Time: 08/09/21 12:27 PM  Result Value Ref Range   Glucose-Capillary 164 (H) 70 - 99 mg/dL    Comment: Glucose reference range applies only to samples taken after fasting for at least 8 hours.   Comment 1 Notify RN   Glucose, capillary     Status: Abnormal   Collection Time: 08/09/21  4:31 PM  Result Value Ref Range   Glucose-Capillary 234 (H) 70 - 99 mg/dL    Comment: Glucose reference range applies only to samples taken after fasting for at least 8 hours.  Glucose, capillary     Status: Abnormal   Collection Time: 08/09/21  8:34 PM  Result Value Ref Range   Glucose-Capillary 116 (H) 70 - 99 mg/dL    Comment: Glucose reference range applies only to samples taken after fasting for at least 8 hours.  Glucose, capillary     Status: Abnormal   Collection Time: 08/10/21  7:31 AM  Result Value Ref Range   Glucose-Capillary 191 (H) 70 - 99 mg/dL    Comment: Glucose reference range applies only to samples taken after fasting for at least 8 hours.    Blood Alcohol level:  Lab Results  Component Value Date   ETH <10 08/05/2021   ETH <10 123456    Metabolic Disorder Labs: Lab Results  Component Value Date   HGBA1C 10.0 (H) 08/05/2021   MPG 240.3 08/05/2021   MPG 205.86 01/27/2021   No results found for: PROLACTIN Lab Results  Component Value Date   CHOL 143 08/05/2021   TRIG 245 (H) 08/05/2021   HDL 33 (L) 08/05/2021   CHOLHDL 4.3 08/05/2021   VLDL 49 (H) 08/05/2021   LDLCALC 61 08/05/2021   LDLCALC 71 01/27/2021    Physical Findings: AIMS:  , ,  ,  ,     CIWA:    COWS:     Musculoskeletal: Strength & Muscle Tone: within normal limits Gait & Station: normal Patient leans: N/A  Psychiatric Specialty Exam:  Presentation  General Appearance: Disheveled  Eye Contact:Minimal  Speech:Clear and Coherent; Normal Rate  Speech Volume:Normal  Handedness:Right   Mood and Affect  Mood:Irritable  Affect:Congruent; Depressed   Thought Process  Thought Processes:Coherent  Descriptions of Associations:Intact  Orientation:Full (Time, Place and Person)  Thought Content:Logical  History of Schizophrenia/Schizoaffective disorder:No  Duration of Psychotic Symptoms:No data recorded Hallucinations:No data recorded Ideas of Reference:None  Suicidal Thoughts:No data recorded Homicidal Thoughts:No data recorded  Sensorium  Memory:Immediate Good; Recent Good; Remote Good  Judgment:Poor  Insight:Poor   Executive Functions  Concentration:Good  Attention Span:Good  [place] of Knowledge:Good  Language:Good   Psychomotor Activity  Psychomotor Activity:No data recorded  Assets  Assets:Communication Skills; Desire for Improvement; Housing; Intimacy; Leisure Time; Physical Health; Resilience; Social Support   Sleep  Sleep:No data recorded   Physical Exam: Physical Exam Vitals and nursing note reviewed.  Constitutional:      Appearance: Normal appearance.  HENT:     Head: Normocephalic and atraumatic.     Mouth/Throat:     Pharynx: Oropharynx is clear.  Eyes:     Pupils: Pupils are equal, round, and reactive to light.  Cardiovascular:     Rate and Rhythm: Normal rate and regular rhythm.  Pulmonary:     Effort: Pulmonary effort is normal.     Breath sounds: Normal breath sounds.  Abdominal:     General: Abdomen is flat.     Palpations: Abdomen is soft.  Musculoskeletal:        General: Normal range of motion.       Feet:  Skin:    General: Skin is warm and dry.  Neurological:     General: No  focal deficit present.     Mental Status: He is alert. Mental status is at baseline.  Psychiatric:        Attention and Perception: Attention normal.        Mood and Affect: Mood is depressed. Affect is blunt.        Speech: Speech is delayed.        Behavior: Behavior is slowed.        Thought Content: Thought content includes suicidal ideation. Thought content does not include suicidal plan.   Review of Systems  Constitutional: Negative.   HENT: Negative.    Eyes: Negative.   Respiratory: Negative.    Cardiovascular:  Positive for leg swelling.  Gastrointestinal: Negative.   Musculoskeletal: Negative.   Skin: Negative.   Neurological: Negative.   Psychiatric/Behavioral:  Positive for depression and suicidal ideas. Negative for hallucinations and substance abuse. The patient is nervous/anxious and has insomnia.   Blood pressure 120/66, pulse 84, temperature (!) 97.5 F (36.4 C), temperature source Oral, resp. rate 18, height 5\' 5"  (1.651 m), weight 123.4 kg, SpO2 96 %. Body mass index is 45.26 kg/m.   Treatment Plan Summary: Plan adding Lasix but also checking a basic metabolic panel tomorrow morning.  N.p.o. order in place for ECT as well as order to hold insulin tomorrow.  Orders will be placed for ECT tomorrow.  Alethia Berthold, MD 08/10/2021, 11:30 AM

## 2021-08-10 NOTE — Progress Notes (Signed)
Patient oriented to unit. Patient rates pain 9/10. Patient refused PRN tylenol but knows that it is available when needed. Patient rates anxiety and depression 10/10. Patient denies SI/HI/AVH. Patient observed interacting in the dayroom and watching television. Patient compliant with medication administration with the exception of the scheduled furosemide. Patient stated that it was too late for him to take that medication because it would cause him to urinate throughout the night. Patient states that they will take the next dose of furosemide as scheduled tomorrow at 0800. Q15 minute safety checks maintained. Patient remains safe on the unit at this time.

## 2021-08-10 NOTE — Progress Notes (Signed)
Patient alert and oriented x 4, no distress noted, thoughts are organized and coherent, makes eye contact, he denies SI/HI/AVH  minimal interaction with staff he appears guarded, he was complaint with scheduled medication regimen. 15 minutes safety checks maintained will continue to monitor.

## 2021-08-10 NOTE — [facility] Suicide Risk Assessment (Signed)
[facility] INPATIENT:  Family/Significant Other Suicide Prevention Education  Suicide Prevention Education:  Education Completed; Luis [name] (friend) [phone] has been identified by the patient as the family member/significant other with whom the patient will be residing, and identified as the person(s) who will aid the patient in the event of a mental health crisis (suicidal ideations/suicide attempt).  With written consent from the patient, the family member/significant other has been provided the following suicide prevention education, prior to the and/or following the discharge of the patient.  The suicide prevention education provided includes the following: Suicide risk factors Suicide prevention and interventions National Suicide Hotline telephone number [facility] assessment telephone number [facility] Emergency Assistance 911 [facility] and/or Residential Mobile Crisis Unit telephone number  Request made of family/significant other to: Remove weapons (e.g., guns, rifles, knives), all items previously/currently identified as safety concern.   Remove drugs/medications (over-the-counter, prescriptions, illicit drugs), all items previously/currently identified as a safety concern.  The family member/significant other verbalizes understanding of the suicide prevention education information provided.  The family member/significant other agrees to remove the items of safety concern listed above.  Patient's friend, Luis [name] states she lives in [place] but talks with patient on a daily basis. Luis [name] states she is unsure if patient has firearms in the home.   Luis [name] 08/10/2021, 12:11 PM

## 2021-08-11 ENCOUNTER — Inpatient Hospital Stay: Payer: Medicare Other | Admitting: Anesthesiology

## 2021-08-11 ENCOUNTER — Encounter: Payer: Self-pay | Admitting: Family

## 2021-08-11 ENCOUNTER — Ambulatory Visit: Payer: Medicare Other | Attending: Psychiatry

## 2021-08-11 DIAGNOSIS — E119 Type 2 diabetes mellitus without complications: Secondary | ICD-10-CM | POA: Insufficient documentation

## 2021-08-11 DIAGNOSIS — F332 Major depressive disorder, recurrent severe without psychotic features: Secondary | ICD-10-CM | POA: Diagnosis not present

## 2021-08-11 LAB — BASIC METABOLIC PANEL
Anion gap: 5 (ref 5–15)
BUN: 11 mg/dL (ref 6–20)
CO2: 28 mmol/L (ref 22–32)
Calcium: 8.4 mg/dL — ABNORMAL LOW (ref 8.9–10.3)
Chloride: 103 mmol/L (ref 98–111)
Creatinine, Ser: 0.65 mg/dL (ref 0.61–1.24)
GFR, Estimated: >60 mL/min (ref >60)
Glucose, Bld: 218 mg/dL — ABNORMAL HIGH (ref 70–99)
Potassium: 4.1 mmol/L (ref 3.5–5.1)
Sodium: 136 mmol/L (ref 135–145)

## 2021-08-11 LAB — GLUCOSE, CAPILLARY
Glucose-Capillary: 223 mg/dL — ABNORMAL HIGH (ref 70–99)
Glucose-Capillary: 332 mg/dL — ABNORMAL HIGH (ref 70–99)
Glucose-Capillary: 169 mg/dL — ABNORMAL HIGH (ref 70–99)

## 2021-08-11 MED ORDER — MIDAZOLAM HCL 2 MG/2ML IJ SOLN
INTRAMUSCULAR | Status: AC
  Filled 2021-08-11: qty 2

## 2021-08-11 MED ORDER — GLYCOPYRROLATE 0.2 MG/ML IJ SOLN: 0.2000 mg | Freq: Once | INTRAMUSCULAR | Status: DC

## 2021-08-11 MED ORDER — MIDAZOLAM HCL 2 MG/2ML IJ SOLN
2.0000 mg | Freq: Once | INTRAMUSCULAR | Status: AC
  Administered 2021-08-13: 2 mg via INTRAVENOUS

## 2021-08-11 MED ORDER — METHOHEXITAL SODIUM 100 MG/10ML IV SOSY
PREFILLED_SYRINGE | INTRAVENOUS | Status: DC | PRN
  Administered 2021-08-11: 120 mg via INTRAVENOUS

## 2021-08-11 MED ORDER — ONDANSETRON HCL 4 MG/2ML IJ SOLN: 4.0000 mg | Freq: Once | INTRAMUSCULAR | Status: DC | PRN

## 2021-08-11 MED ORDER — SUCCINYLCHOLINE CHLORIDE 200 MG/10ML IV SOSY
PREFILLED_SYRINGE | INTRAVENOUS | Status: DC | PRN
  Administered 2021-08-11: 150 mg via INTRAVENOUS

## 2021-08-11 MED ORDER — LABETALOL HCL 5 MG/ML IV SOLN
INTRAVENOUS | Status: DC | PRN
  Administered 2021-08-11: 5 mg via INTRAVENOUS
  Administered 2021-08-11: 10 mg via INTRAVENOUS

## 2021-08-11 MED ORDER — SODIUM CHLORIDE 0.9 % IV SOLN: 500.0000 mL | Freq: Once | INTRAVENOUS | Status: AC

## 2021-08-11 MED ORDER — MIDAZOLAM HCL 2 MG/2ML IJ SOLN
INTRAMUSCULAR | Status: DC | PRN
  Administered 2021-08-11: 2 mg via INTRAVENOUS

## 2021-08-11 NOTE — Progress Notes (Signed)
[facility] MD Progress Note  08/11/2021 2:49 PM Luis [name]  MRN:  [id number] Subjective: Follow-up 51 year old man with major depression.  Continues to endorse depressed mood sense of hopelessness low-energy passive suicidal thoughts.  Also several medical issues especially the pain in his foot.  He was looking forward to ECT treatment and had his first ECT today which he tolerated fine without any complications Principal Problem: MDD (major depressive disorder), recurrent severe, without psychosis ([place]) Diagnosis: Principal Problem:   MDD (major depressive disorder), recurrent severe, without psychosis ([place]) Active Problems:   Chronic systolic CHF (congestive heart failure) ([place])   Pacemaker   Diabetes mellitus without complication ([place])   Hypertension   GERD (gastroesophageal reflux disease)   Cocaine use disorder ([place])   Sleep apnea  Total Time spent with patient: 30 minutes  Past Psychiatric History: Past history of recurrent depression and substance abuse  Past Medical History:  Past Medical History:  Diagnosis Date   Anxiety    Biventricular ICD (implantable cardioverter-defibrillator) in place    Chronic systolic CHF (congestive heart failure) ([place])    Cocaine use    Diabetes mellitus without complication (H[facility])    GERD (gastroesophageal reflux disease)    HLD (hyperlipidemia)    Hypertension    Morbid obesity (H[facility])    NICM (nonischemic cardiomyopathy) (H[facility])    OSA (obstructive sleep apnea)    PTSD (post-traumatic stress disorder)    on Depakote   Refusal of blood transfusions as patient is Jehovah's Witness    Tobacco abuse     Past Surgical History:  Procedure Laterality Date   BIV ICD INSERTION CRT-D     FOOT FRACTURE SURGERY     ROTATOR CUFF REPAIR     TONSILLECTOMY     Family History:  Family History  Problem Relation Age of Onset   Hypertension Mother    Bone cancer Father    Lung cancer Father    Family Psychiatric  History: See previous Social  History:  Social History   Substance and Sexual Activity  Alcohol Use Not Currently     Social History   Substance and Sexual Activity  Drug Use Yes   Types: Cocaine    Social History   Socioeconomic History   Marital status: Legally Separated    Spouse name: Not on file   Number of children: Not on file   Years of education: Not on file   Highest education level: Not on file  Occupational History   Not on file  Tobacco Use   Smoking status: Every Day    Types: Cigarettes   Smokeless tobacco: Never   Tobacco comments:    occasionally  Vaping Use   Vaping Use: Never used  Substance and Sexual Activity   Alcohol use: Not Currently   Drug use: Yes    Types: Cocaine   Sexual activity: Not on file  Other Topics Concern   Not on file  Social History Narrative   Not on file   Social Determinants of Health   Financial Resource Strain: Not on file  Food Insecurity: Not on file  Transportation Needs: Not on file  Physical Activity: Not on file  Stress: Not on file  Social Connections: Not on file   Additional Social History:                         Sleep: Fair  Appetite:  Fair  Current Medications: Current Facility-Administered Medications  Medication Dose Route  Frequency Provider Last Rate Last Admin   acetaminophen (TYLENOL) tablet 650 mg  650 mg Oral Q6H PRN Lucky Rathke, FNP   650 mg at 08/09/21 1735   alum & mag hydroxide-simeth (MAALOX/MYLANTA) 200-200-20 MG/5ML suspension 30 mL  30 mL Oral Q4H PRN Lucky Rathke, FNP       buPROPion (WELLBUTRIN XL) 24 hr tablet 300 mg  300 mg Oral Daily Alin Chavira T, MD   300 mg at 08/10/21 0851   carvedilol (COREG) tablet 6.25 mg  6.25 mg Oral BID WC Briley Bumgarner T, MD   6.25 mg at 08/10/21 1726   clopidogrel (PLAVIX) tablet 75 mg  75 mg Oral Daily Daron Breeding, Madie Reno, MD   75 mg at 08/10/21 0851   DULoxetine (CYMBALTA) DR capsule 90 mg  90 mg Oral Daily Elayne Gruver T, MD   90 mg at 08/10/21 0851   furosemide  (LASIX) tablet 80 mg  80 mg Oral Daily Jadriel Saxer T, MD       glycopyrrolate (ROBINUL) injection 0.2 mg  0.2 mg Intravenous Once Jyssica Rief T, MD       hydrOXYzine (ATARAX) tablet 25 mg  25 mg Oral TID PRN Lucky Rathke, FNP   25 mg at 08/09/21 2139   ibuprofen (ADVIL) tablet 600 mg  600 mg Oral Q6H PRN Tula Schryver T, MD       insulin aspart (novoLOG) injection 0-5 Units  0-5 Units Subcutaneous QHS Akari Crysler T, MD   5 Units at 08/10/21 2200   insulin aspart (novoLOG) injection 0-9 Units  0-9 Units Subcutaneous TID WC Dmani Mizer T, MD   3 Units at 08/10/21 1727   insulin aspart protamine- aspart (NOVOLOG MIX 70/30) injection 60 Units  60 Units Subcutaneous TID PC Denecia Brunette T, MD   60 Units at 08/10/21 1727   magnesium hydroxide (MILK OF MAGNESIA) suspension 30 mL  30 mL Oral Daily PRN Lucky Rathke, FNP       midazolam (VERSED) injection 2 mg  2 mg Intravenous Once Decklin Weddington T, MD       ondansetron (ZOFRAN) injection 4 mg  4 mg Intravenous Once PRN Arita Miss, MD       pantoprazole (PROTONIX) EC tablet 40 mg  40 mg Oral Daily Muneeb Veras, Madie Reno, MD   40 mg at 08/10/21 0851   prazosin (MINIPRESS) capsule 2 mg  2 mg Oral QHS Amandy Chubbuck T, MD   2 mg at 08/10/21 2159   rosuvastatin (CRESTOR) tablet 20 mg  20 mg Oral Daily Lewi Drost, Madie Reno, MD   20 mg at 08/10/21 0855   traZODone (DESYREL) tablet 200 mg  200 mg Oral QHS Stace Peace T, MD   200 mg at 08/10/21 2158    Lab Results:  Results for orders placed or performed during the hospital encounter of 08/06/21 (from the past 48 hour(s))  Glucose, capillary     Status: Abnormal   Collection Time: 08/09/21  4:31 PM  Result Value Ref Range   Glucose-Capillary 234 (H) 70 - 99 mg/dL    Comment: Glucose reference range applies only to samples taken after fasting for at least 8 hours.  Glucose, capillary     Status: Abnormal   Collection Time: 08/09/21  8:34 PM  Result Value Ref Range   Glucose-Capillary 116 (H) 70 - 99 mg/dL     Comment: Glucose reference range applies only to samples taken after fasting for at least 8 hours.  Glucose,  capillary     Status: Abnormal   Collection Time: 08/10/21  7:31 AM  Result Value Ref Range   Glucose-Capillary 191 (H) 70 - 99 mg/dL    Comment: Glucose reference range applies only to samples taken after fasting for at least 8 hours.  Glucose, capillary     Status: Abnormal   Collection Time: 08/10/21 11:43 AM  Result Value Ref Range   Glucose-Capillary 237 (H) 70 - 99 mg/dL    Comment: Glucose reference range applies only to samples taken after fasting for at least 8 hours.  Glucose, capillary     Status: Abnormal   Collection Time: 08/10/21  4:12 PM  Result Value Ref Range   Glucose-Capillary 223 (H) 70 - 99 mg/dL    Comment: Glucose reference range applies only to samples taken after fasting for at least 8 hours.  Glucose, capillary     Status: Abnormal   Collection Time: 08/10/21 10:35 PM  Result Value Ref Range   Glucose-Capillary 332 (H) 70 - 99 mg/dL    Comment: Glucose reference range applies only to samples taken after fasting for at least 8 hours.  Basic metabolic panel     Status: Abnormal   Collection Time: 08/11/21  6:48 AM  Result Value Ref Range   Sodium 136 135 - 145 mmol/L   Potassium 4.1 3.5 - 5.1 mmol/L   Chloride 103 98 - 111 mmol/L   CO2 28 22 - 32 mmol/L   Glucose, Bld 218 (H) 70 - 99 mg/dL    Comment: Glucose reference range applies only to samples taken after fasting for at least 8 hours.   BUN 11 6 - 20 mg/dL   Creatinine, Ser 0.65 0.61 - 1.24 mg/dL   Calcium 8.4 (L) 8.9 - 10.3 mg/dL   GFR, Estimated >60 >60 mL/min    Comment: (NOTE) Calculated using the CKD-EPI Creatinine Equation (2021)    Anion gap 5 5 - 15    Comment: Performed at [facility], [address] [place], [place] [address]  Glucose, capillary     Status: Abnormal   Collection Time: 08/11/21  1:52 PM  Result Value Ref Range   Glucose-Capillary 169 (H) 70 - 99 mg/dL     Comment: Glucose reference range applies only to samples taken after fasting for at least 8 hours.    Blood Alcohol level:  Lab Results  Component Value Date   ETH <10 08/05/2021   ETH <10 123456    Metabolic Disorder Labs: Lab Results  Component Value Date   HGBA1C 10.0 (H) 08/05/2021   MPG 240.3 08/05/2021   MPG 205.86 01/27/2021   No results found for: PROLACTIN Lab Results  Component Value Date   CHOL 143 08/05/2021   TRIG 245 (H) 08/05/2021   HDL 33 (L) 08/05/2021   CHOLHDL 4.3 08/05/2021   VLDL 49 (H) 08/05/2021   LDLCALC 61 08/05/2021   LDLCALC 71 01/27/2021    Physical Findings: AIMS:  , ,  ,  ,    CIWA:    COWS:     Musculoskeletal: Strength & Muscle Tone: within normal limits Gait & Station: normal Patient leans: N/A  Psychiatric Specialty Exam:  Presentation  General Appearance: Disheveled  Eye Contact:Minimal  Speech:Clear and Coherent; Normal Rate  Speech Volume:Normal  Handedness:Right   Mood and Affect  Mood:Irritable  Affect:Congruent; Depressed   Thought Process  Thought Processes:Coherent  Descriptions of Associations:Intact  Orientation:Full (Time, Place and Person)  Thought Content:Logical  History of  Schizophrenia/Schizoaffective disorder:No  Duration of Psychotic Symptoms:No data recorded Hallucinations:No data recorded Ideas of Reference:None  Suicidal Thoughts:No data recorded Homicidal Thoughts:No data recorded  Sensorium  Memory:Immediate Good; Recent Good; Remote Good  Judgment:Poor  Insight:Poor   Executive Functions  Concentration:Good  Attention Span:Good  [place] of Knowledge:Good  Language:Good   Psychomotor Activity  Psychomotor Activity:No data recorded  Assets  Assets:Communication Skills; Desire for Improvement; Housing; Intimacy; Leisure Time; Physical Health; Resilience; Social Support   Sleep  Sleep:No data recorded   Physical Exam: Physical  Exam Vitals and nursing note reviewed.  Constitutional:      Appearance: Normal appearance.  HENT:     Head: Normocephalic and atraumatic.     Mouth/Throat:     Pharynx: Oropharynx is clear.  Eyes:     Pupils: Pupils are equal, round, and reactive to light.  Cardiovascular:     Rate and Rhythm: Normal rate and regular rhythm.  Pulmonary:     Effort: Pulmonary effort is normal.     Breath sounds: Normal breath sounds.  Abdominal:     General: Abdomen is flat.     Palpations: Abdomen is soft.  Musculoskeletal:        General: Normal range of motion.  Skin:    General: Skin is warm and dry.  Neurological:     General: No focal deficit present.     Mental Status: He is alert. Mental status is at baseline.  Psychiatric:        Attention and Perception: He is inattentive.        Mood and Affect: Mood is depressed.        Speech: Speech is delayed.        Behavior: Behavior is slowed.        Thought Content: Thought content includes suicidal ideation. Thought content does not include suicidal plan.   Review of Systems  Constitutional: Negative.   HENT: Negative.    Eyes: Negative.   Respiratory: Negative.    Cardiovascular: Negative.   Gastrointestinal: Negative.   Musculoskeletal: Negative.   Skin: Negative.   Neurological: Negative.   Psychiatric/Behavioral:  Positive for depression, substance abuse and suicidal ideas.   Blood pressure (!) 139/59, pulse 80, temperature 97.6 F (36.4 C), resp. rate 20, height 5\' 5"  (1.651 m), weight 123.4 kg, SpO2 96 %. Body mass index is 45.26 kg/m.   Treatment Plan Summary: Medication management and Plan continue current medication while beginning electroconvulsive therapy treatment with first right unilateral treatment today.  Continue index course with daily assessment.  Alethia Berthold, MD 08/11/2021, 2:49 PM
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
## 2021-08-11 NOTE — Anesthesia Postprocedure Evaluation (Signed)
Anesthesia Post Note  Patient: Luis [name]  Procedure(s) Performed: ECT TX  Patient location during evaluation: PACU Anesthesia Type: General Level of consciousness: awake and alert Pain management: pain level controlled Vital Signs Assessment: post-procedure vital signs reviewed and stable Respiratory status: spontaneous breathing, nonlabored ventilation, respiratory function stable and patient connected to nasal cannula oxygen Cardiovascular status: blood pressure returned to baseline and stable Postop Assessment: no apparent nausea or vomiting Anesthetic complications: no   No notable events documented.   Last Vitals:  Vitals:   08/11/21 1352 08/11/21 1400  BP:  (!) 139/59  Pulse: 81 80  Resp: 20 20  Temp:  36.4 C  SpO2: 93% 96%    Last Pain:  Vitals:   08/11/21 1400  TempSrc:   PainSc: 0-No pain                 [name]

## 2021-08-11 NOTE — H&P (Signed)
Luis [name] is an 50 y.o. male.   Chief Complaint: severe depressed mood HPI: recurrent depression  Past Medical History:  Diagnosis Date   Anxiety    Biventricular ICD (implantable cardioverter-defibrillator) in place    Chronic systolic CHF (congestive heart failure) (HCC)    Cocaine use    Diabetes mellitus without complication (HCC)    GERD (gastroesophageal reflux disease)    HLD (hyperlipidemia)    Hypertension    Morbid obesity (HCC)    NICM (nonischemic cardiomyopathy) (HCC)    OSA (obstructive sleep apnea)    PTSD (post-traumatic stress disorder)    on Depakote   Refusal of blood transfusions as patient is Jehovah's Witness    Tobacco abuse     Past Surgical History:  Procedure Laterality Date   BIV ICD INSERTION CRT-D     FOOT FRACTURE SURGERY     ROTATOR CUFF REPAIR     TONSILLECTOMY      Family History  Problem Relation Age of Onset   Hypertension Mother    Bone cancer Father    Lung cancer Father    Social History:  reports that he has been smoking cigarettes. He has never used smokeless tobacco. He reports that he does not currently use alcohol. He reports current drug use. Drug: Cocaine.  Allergies:  Allergies  Allergen Reactions   Aspirin Anaphylaxis and Other (See Comments)    Throat closing    Iodine-131 Hives   Iodinated Contrast Media Hives   Latex Hives and Rash   Penicillins Nausea And Vomiting   Tramadol Hives and Nausea Only   Amoxicillin-Pot Clavulanate Diarrhea   Lidocaine Rash   Lisinopril Cough    Medications Prior to Admission  Medication Sig Dispense Refill   Artificial Tear Solution (TEARS NATURALE OP) Place 2 drops into both eyes in the morning and at bedtime.     buPROPion (WELLBUTRIN XL) 150 MG 24 hr tablet Take 1 tablet (150 mg total) by mouth every morning. 30 tablet 1   carvedilol (COREG) 6.25 MG tablet Take 6.25 mg by mouth 2 (two) times daily.     clopidogrel (PLAVIX) 75 MG tablet Take 75 mg by mouth daily.      clotrimazole-betamethasone (LOTRISONE) cream Apply 1 application topically in the morning and at bedtime. Applied to affected area.     divalproex (DEPAKOTE) 500 MG DR tablet Take 1 tablet (500 mg total) by mouth 2 (two) times daily. 60 tablet 1   DULoxetine (CYMBALTA) 30 MG capsule Take 90 mg by mouth daily.     furosemide (LASIX) 80 MG tablet Take 80 mg by mouth daily.     HUMALOG MIX 75/25 KWIKPEN (75-25) 100 UNIT/ML Kwikpen Inject 20 Units into the skin 2 (two) times daily before a meal. 15 mL 11   MELATONIN GUMMIES PO Take 3 tablets by mouth at bedtime.     Multiple Vitamin (MULTIVITAMIN WITH MINERALS) TABS tablet Take 1 tablet by mouth daily.     oxyCODONE-acetaminophen (PERCOCET) 7.5-325 MG tablet Take 0.5-1 tablets by mouth every 12 (twelve) hours as needed for severe pain.     pantoprazole (PROTONIX) 40 MG tablet Take 40 mg by mouth daily.     prazosin (MINIPRESS) 2 MG capsule Take 1 capsule (2 mg total) by mouth at bedtime. 30 capsule 1   ramelteon (ROZEREM) 8 MG tablet Take 1 tablet (8 mg total) by mouth at bedtime. 30 tablet 1   rosuvastatin (CRESTOR) 20 MG tablet Take 20 mg by mouth every  evening.     sertraline (ZOLOFT) 100 MG tablet Take 2 tablets (200 mg total) by mouth daily. 60 tablet 1   traZODone (DESYREL) 100 MG tablet Take 200 mg by mouth at bedtime.     TRULICITY 1.5 MG/0.5ML SOPN Inject 1.5 mg into the skin once a week.     VICTOZA 18 MG/3ML SOPN Inject 1.8 mg into the skin daily. 9 mL 0    Results for orders placed or performed during the hospital encounter of 08/06/21 (from the past 48 hour(s))  Glucose, capillary     Status: Abnormal   Collection Time: 08/09/21 12:27 PM  Result Value Ref Range   Glucose-Capillary 164 (H) 70 - 99 mg/dL    Comment: Glucose reference range applies only to samples taken after fasting for at least 8 hours.   Comment 1 Notify RN   Glucose, capillary     Status: Abnormal   Collection Time: 08/09/21  4:31 PM  Result Value Ref Range    Glucose-Capillary 234 (H) 70 - 99 mg/dL    Comment: Glucose reference range applies only to samples taken after fasting for at least 8 hours.  Glucose, capillary     Status: Abnormal   Collection Time: 08/09/21  8:34 PM  Result Value Ref Range   Glucose-Capillary 116 (H) 70 - 99 mg/dL    Comment: Glucose reference range applies only to samples taken after fasting for at least 8 hours.  Glucose, capillary     Status: Abnormal   Collection Time: 08/10/21  7:31 AM  Result Value Ref Range   Glucose-Capillary 191 (H) 70 - 99 mg/dL    Comment: Glucose reference range applies only to samples taken after fasting for at least 8 hours.  Glucose, capillary     Status: Abnormal   Collection Time: 08/10/21 11:43 AM  Result Value Ref Range   Glucose-Capillary 237 (H) 70 - 99 mg/dL    Comment: Glucose reference range applies only to samples taken after fasting for at least 8 hours.  Glucose, capillary     Status: Abnormal   Collection Time: 08/10/21  4:12 PM  Result Value Ref Range   Glucose-Capillary 223 (H) 70 - 99 mg/dL    Comment: Glucose reference range applies only to samples taken after fasting for at least 8 hours.  Glucose, capillary     Status: Abnormal   Collection Time: 08/10/21 10:35 PM  Result Value Ref Range   Glucose-Capillary 332 (H) 70 - 99 mg/dL    Comment: Glucose reference range applies only to samples taken after fasting for at least 8 hours.  Basic metabolic panel     Status: Abnormal   Collection Time: 08/11/21  6:48 AM  Result Value Ref Range   Sodium 136 135 - 145 mmol/L   Potassium 4.1 3.5 - 5.1 mmol/L   Chloride 103 98 - 111 mmol/L   CO2 28 22 - 32 mmol/L   Glucose, Bld 218 (H) 70 - 99 mg/dL    Comment: Glucose reference range applies only to samples taken after fasting for at least 8 hours.   BUN 11 6 - 20 mg/dL   Creatinine, Ser 3.76 0.61 - 1.24 mg/dL   Calcium 8.4 (L) 8.9 - 10.3 mg/dL   GFR, Estimated >28 >31 mL/min    Comment: (NOTE) Calculated using the  CKD-EPI Creatinine Equation (2021)    Anion gap 5 5 - 15    Comment: Performed at [facility], [address] [place], [place]  [address]   DG Chest 2 View  Result Date: 08/10/2021 CLINICAL DATA:  CHF EXAM: CHEST - 2 VIEW COMPARISON:  09/30/2020 FINDINGS: Cardiomegaly. Left chest multi lead pacer. Both lungs are clear. Disc degenerative disease of the thoracic spine. IMPRESSION: Cardiomegaly without acute abnormality of the lungs. Electronically Signed   By: Jearld Lesch M.D.   On: 08/10/2021 15:21    Review of Systems  Constitutional: Negative.   HENT: Negative.    Eyes: Negative.   Respiratory: Negative.    Cardiovascular: Negative.   Gastrointestinal: Negative.   Musculoskeletal: Negative.   Skin: Negative.   Neurological: Negative.   Psychiatric/Behavioral:  Positive for dysphoric mood and suicidal ideas.    Blood pressure 120/66, pulse 84, temperature (!) 97.5 F (36.4 C), temperature source Oral, resp. rate 18, height 5\' 5"  (1.651 m), weight 123.4 kg, SpO2 96 %. Physical Exam Vitals reviewed.  Constitutional:      Appearance: He is well-developed.  HENT:     Head: Normocephalic and atraumatic.  Eyes:     Conjunctiva/sclera: Conjunctivae normal.     Pupils: Pupils are equal, round, and reactive to light.  Cardiovascular:     Heart sounds: Normal heart sounds.  Pulmonary:     Effort: Pulmonary effort is normal.  Abdominal:     Palpations: Abdomen is soft.  Musculoskeletal:        General: Normal range of motion.     Cervical back: Normal range of motion.  Skin:    General: Skin is warm and dry.  Neurological:     General: No focal deficit present.     Mental Status: He is alert.  Psychiatric:        Attention and Perception: He is inattentive.        Mood and Affect: Mood is depressed.        Speech: Speech is delayed.        Behavior: Behavior is slowed.        Thought Content: Thought content includes suicidal ideation. Thought content does not  include suicidal plan.        Cognition and Memory: Cognition is impaired.     Assessment/Plan Initiate index treatment RUL ect  , MD 08/11/2021, 11:56 AM

## 2021-08-11 NOTE — Anesthesia Preprocedure Evaluation (Signed)
Anesthesia Evaluation  Patient identified by MRN, date of birth, ID band Patient awake    Reviewed: Allergy & Precautions, NPO status , Patient's Chart, lab work & pertinent test results  History of Anesthesia Complications Negative for: history of anesthetic complications  Airway Mallampati: II  TM Distance: >3 FB Neck ROM: Full    Dental no notable dental hx. (+) Teeth Intact   Pulmonary sleep apnea , neg COPD, Current Smoker and Patient abstained from smoking.,    Pulmonary exam normal breath sounds clear to auscultation       Cardiovascular Exercise Tolerance: Good METShypertension, +CHF  (-) CAD and (-) Past MI (-) dysrhythmias + Cardiac Defibrillator  Rhythm:Regular Rate:Normal - Systolic murmurs 1. No LV thrombus is seen. There is marked septal-lateral wall left  ventricular dyssynchrony. Left ventricular ejection fraction, by  estimation, is 30 to 35%. The left ventricle has moderately decreased  function. The left ventricle demonstrates global  hypokinesis. Left ventricular diastolic parameters are consistent with  Grade II diastolic dysfunction (pseudonormalization). Elevated left atrial  pressure.  2. Right ventricular systolic function is normal. The right ventricular  size is normal. Tricuspid regurgitation signal is inadequate for assessing  PA pressure.  3. Left atrial size was moderately dilated.  4. The mitral valve is normal in structure. Mild mitral valve  regurgitation. No evidence of mitral stenosis.  5. The aortic valve is normal in structure. Aortic valve regurgitation is  not visualized. No aortic stenosis is present.  6. The inferior vena cava is dilated in size with <50% respiratory  variability, suggesting right atrial pressure of 15 mmHg.    Neuro/Psych PSYCHIATRIC DISORDERS Anxiety Depression negative neurological ROS     GI/Hepatic GERD  ,(+)     substance abuse  cocaine use, Patient  cocaine positive on admission 08/05/21. Has been in hospital since then. Patient denies cocaine use this month. It is more than 5 days out since last positive UDS on admission, patient has remained in hospital. Reasonable to proceed without repeat UDS   Endo/Other  diabetesMorbid obesity  Renal/GU negative Renal ROS     Musculoskeletal   Abdominal (+) + obese,   Peds  Hematology   Anesthesia Other Findings Past Medical History: No date: Anxiety No date: Biventricular ICD (implantable cardioverter-defibrillator)  in place No date: Chronic systolic CHF (congestive heart failure) (HCC) No date: Cocaine use No date: Diabetes mellitus without complication (HCC) No date: GERD (gastroesophageal reflux disease) No date: HLD (hyperlipidemia) No date: Hypertension No date: Morbid obesity (HCC) No date: NICM (nonischemic cardiomyopathy) ([place]) No date: OSA (obstructive sleep apnea) No date: PTSD (post-traumatic stress disorder)     Comment:  on Depakote No date: Refusal of blood transfusions as patient is Jehovah's Witness No date: Tobacco abuse  Reproductive/Obstetrics                             Anesthesia Physical Anesthesia Plan  ASA: 4  Anesthesia Plan: General   Post-op Pain Management: Minimal or no pain anticipated   Induction: Intravenous  PONV Risk Score and Plan: 1 and Ondansetron and TIVA  Airway Management Planned: Mask  Additional Equipment: None  Intra-op Plan:   Post-operative Plan:   Informed Consent: I have reviewed the patients History and Physical, chart, labs and discussed the procedure including the risks, benefits and alternatives for the proposed anesthesia with the patient or authorized representative who has indicated his/her understanding and acceptance.  Dental advisory given  Plan Discussed with: CRNA and Surgeon  Anesthesia Plan Comments: (Discussed risks of anesthesia with patient, including PONV, muscle  aches. Rare risks discussed as well, such as cardiorespiratory sequelae, need for airway intervention and its associated risks including lip/dental/eye damage and sore throat, and allergic reactions. Discussed the role of CRNA in patient's perioperative care. Patient understands. Patient counseled on being higher risk for anesthesia due to comorbidities: morbid obesity, HFrEF. Patient was told about increased risk of cardiac and respiratory events, including death. )        Anesthesia Quick Evaluation

## 2021-08-11 NOTE — Group Note (Signed)
[facility] LCSW Group Therapy Note ° ° ° °Group Date: 08/11/2021 °Start Time: 1300 °End Time: 1400 ° °Type of Therapy and Topic:  Group Therapy:  Overcoming Obstacles ° °Participation Level:  [facility] PARTICIPATION LEVEL: Did Not Attend ° °Mood: ° °Description of Group:   °In this group patients will be encouraged to explore what they see as obstacles to their own wellness and recovery. They will be guided to discuss their thoughts, feelings, and behaviors related to these obstacles. The group will process together ways to cope with barriers, with attention given to specific choices patients can make. Each patient will be challenged to identify changes they are motivated to make in order to overcome their obstacles. This group will be process-oriented, with patients participating in exploration of their own experiences as well as giving and receiving support and challenge from other group members. ° °Therapeutic Goals: °1. Patient will identify personal and current obstacles as they relate to admission. °2. Patient will identify barriers that currently interfere with their wellness or overcoming obstacles.  °3. Patient will identify feelings, thought process and behaviors related to these barriers. °4. Patient will identify two changes they are willing to make to overcome these obstacles:  ° ° °Summary of Patient Progress ° ° °X ° ° °Therapeutic Modalities:   °Cognitive Behavioral Therapy °Solution Focused Therapy °Motivational Interviewing °Relapse Prevention Therapy ° ° °Luis Pascual J Andreina Outten, LCSW °

## 2021-08-11 NOTE — Procedures (Signed)
ECT SERVICES Physicians Interval Evaluation & Treatment Note  Patient Identification: Luis [name] MRN:  [id number] Date of Evaluation:  08/11/2021 TX #: 1  MADRS:   MMSE:   P.E. Findings:  No change to physical exam.  His heart and lungs are stable normal.  Vitals stable  Psychiatric Interval Note:  Depressed and flat down and passive suicidal thought  Subjective:  Patient is a 51 y.o. male seen for evaluation for Electroconvulsive Therapy. Depressed but agreeable to treatment  Treatment Summary:   [x]   Right Unilateral             []  Bilateral   % Energy : 0.3 ms 55%   Impedance: 1440 ohms  Seizure Energy Index: 32,237 V squared  Postictal Suppression Index: 93%  Seizure Concordance Index: 45%  Medications  Pre Shock: Robinul 0.1 mg Brevital 120 mg succinylcholine 150 mg  Post Shock: Versed 2 mg  Seizure Duration: EMG 10 seconds EEG 27 seconds   Comments: Well tolerated no complication.  Increase energy to 80% continue next treatment Wednesday  Lungs:  [x]   Clear to auscultation               []  Other:   Heart:    [x]   Regular rhythm             []  irregular rhythm    [x]   Previous H&P reviewed, patient examined and there are NO CHANGES                 []   Previous H&P reviewed, patient examined and there are changes noted.   , MD 2/13/20235:28 PM

## 2021-08-11 NOTE — Plan of Care (Signed)
  Problem: Education: Goal: Knowledge of [facility] Education information/materials will improve Outcome: Progressing Goal: Emotional status will improve Outcome: Progressing Goal: Mental status will improve Outcome: Progressing Goal: Verbalization of understanding the information provided will improve Outcome: Progressing   Problem: Activity: Goal: Interest or engagement in activities will improve Outcome: Progressing Goal: Sleeping patterns will improve Outcome: Progressing   Problem: Coping: Goal: Ability to verbalize frustrations and anger appropriately will improve Outcome: Progressing Goal: Ability to demonstrate self-control will improve Outcome: Progressing   

## 2021-08-11 NOTE — Transfer of Care (Signed)
Immediate Anesthesia Transfer of Care Note  Patient: Luis [name]  Procedure(s) Performed: ECT TX  Patient Location: PACU  Anesthesia Type:General  Level of Consciousness: sedated and responds to stimulation  Airway & Oxygen Therapy: Patient Spontanous Breathing and Patient connected to face mask oxygen  Post-op Assessment: Report given to RN and Post -op Vital signs reviewed and stable  Post vital signs: Reviewed and stable  Last Vitals:  Vitals Value Taken Time  BP 140/85 08/11/21 1331  Temp 36.6 C 08/11/21 1331  Pulse 91 08/11/21 1334  Resp 26 08/11/21 1334  SpO2 93 % 08/11/21 1334  Vitals shown include unvalidated device data.  Last Pain:  Vitals:   08/11/21 1331  TempSrc:   PainSc: Asleep         Complications: No notable events documented.

## 2021-08-11 NOTE — Progress Notes (Signed)
Patient oriented to unit. Patient denies pain. Patient denies SI/HI/AVH. Patient was NPO until returned back to the unit from ECT at 1400. 0800 and 1200 medication not given due to NPO. When patient returned back to the unit post ECT assessment was completed. Patient alert and oriented to unit.  Patient compliant with 1700 medication administration. Q15 minute safety checks maintained. Patient remains safe on the unit at this time.

## 2021-08-11 NOTE — Progress Notes (Signed)
Patient is alert and  oriented no distress noted, he denies SI/HI/AVH interacting appropriately with peers and staff. Patient is complaint with medication regime. Patient is scheduled for ECT tomorrow on NPO status, patient aware, will continue to monitor.

## 2021-08-11 NOTE — Progress Notes (Signed)
Recreation Therapy Notes   Date: 08/11/2021  Time: 10:45am    Location:  Courtyard   Behavioral response: Appropriate  Intervention Topic:  Leisure    Discussion/Intervention:  Group content today was focused on leisure. The group defined what leisure is and some positive leisure activities they participate in. Individuals identified the difference between good and bad leisure. Participants expressed how they feel after participating in the leisure of their choice. The group discussed how they go about picking a leisure activity and if others are involved in their leisure activities. The patient stated how many leisure activities they have to choose from and reasons why it is important to have leisure time. Individuals participated in the intervention Exploration of Leisure where they had a chance to identify new leisure activities as well as benefits of leisure. Clinical Observations/Feedback: Patient came to group late and was engaged and social with peers and staff. Individual participated in the intervention and was able to focus on the topic at hand.  Ailana Cuadrado LRT/CTRS         Latasha Puskas 08/11/2021 12:22 PM

## 2021-08-12 ENCOUNTER — Other Ambulatory Visit: Payer: Self-pay | Admitting: Psychiatry

## 2021-08-12 DIAGNOSIS — F332 Major depressive disorder, recurrent severe without psychotic features: Secondary | ICD-10-CM | POA: Diagnosis not present

## 2021-08-12 DIAGNOSIS — E119 Type 2 diabetes mellitus without complications: Secondary | ICD-10-CM | POA: Diagnosis not present

## 2021-08-12 LAB — GLUCOSE, CAPILLARY
Glucose-Capillary: 159 mg/dL — ABNORMAL HIGH (ref 70–99)
Glucose-Capillary: 208 mg/dL — ABNORMAL HIGH (ref 70–99)
Glucose-Capillary: 244 mg/dL — ABNORMAL HIGH (ref 70–99)
Glucose-Capillary: 211 mg/dL — ABNORMAL HIGH (ref 70–99)

## 2021-08-12 NOTE — Group Note (Signed)
[facility] LCSW Group Therapy Note   Group Date: 08/12/2021 Start Time: 1300 End Time: 1400  Type of Therapy/Topic:  Group Therapy:  Feelings about Diagnosis  Participation Level:  Minimal   Mood: Blunted    Description of Group:    This group will allow patients to explore their thoughts and feelings about diagnoses they have received. Patients will be guided to explore their level of understanding and acceptance of these diagnoses. Facilitator will encourage patients to process their thoughts and feelings about the reactions of others to their diagnosis, and will guide patients in identifying ways to discuss their diagnosis with significant others in their lives. This group will be process-oriented, with patients participating in exploration of their own experiences as well as giving and receiving support and challenge from other group members.   Therapeutic Goals: 1. Patient will demonstrate understanding of diagnosis as evidence by identifying two or more symptoms of the disorder:  2. Patient will be able to express two feelings regarding the diagnosis 3. Patient will demonstrate ability to communicate their needs through discussion and/or role plays  Summary of Patient Progress:  Patient was present for the entirety of group session. Patient participated in opening and closing remarks. However, patient did not contribute at all to the topic of discussion despite encouraged participation. Patient did at one point encourage patient to make the best of the situation, although this was his only contribution.     Therapeutic Modalities:   Cognitive Behavioral Therapy Brief Therapy Feelings Identification    Durenda Hurt, [place]

## 2021-08-12 NOTE — Progress Notes (Signed)
Blood sugar 244 at 0630

## 2021-08-12 NOTE — Progress Notes (Signed)
Recreation Therapy Notes   Date: 08/12/2021  Time: 10:30 am    Location: Courtyard       Behavioral response: N/A   Intervention Topic: Wellness     Discussion/Intervention: Patient refused to attend group.   Clinical Observations/Feedback:  Patient refused to attend group.    Mylene Bow LRT/CTRS        Nathalee Smarr 08/12/2021 12:13 PM

## 2021-08-12 NOTE — Progress Notes (Signed)
Patient is active on the unit and engages well with his peers.  He reports having some residual effects from the ECT that was performed earlier. Reports mild headache and nausea, but declined any medication to help with symptoms.  Patient denies si hi avh at this encounter.  Continues to endorse anxiety and depression.  Will continue to monitor with q 15 minute safety checks.  Encouraged patient to seek staff with any concerns.     Rosalie Doctor, LPN

## 2021-08-12 NOTE — [facility] Counselor (Signed)
CSW reached out to [facility], Tania Ade, to let her know that patient was currently hospitalized.  CSW provided no additional information.    Ms Christell Constant stated "I don't even know who you are.  I cant even confirm that he lives here.  All I can say is I've heard you."    CSW terminated the call.  CSW will update the patient.   Penni Homans, MSW, LCSW 08/12/2021 9:23 AM

## 2021-08-12 NOTE — Progress Notes (Signed)
[facility] MD Progress Note  08/12/2021 2:12 PM Luis [name]  MRN:  [id number] Subjective: Follow-up for this 51 year old man with major depression.  Patient tells me he is having a bad day today.  Thinking a lot about his late wife.  Feeling overwhelmed by his life problems.  Passive suicidal ideation without any intent to act on it in the hospital.  No change to underlying physical symptoms.  Tolerated ECT without difficulty Principal Problem: MDD (major depressive disorder), recurrent severe, without psychosis ([place]) Diagnosis: Principal Problem:   MDD (major depressive disorder), recurrent severe, without psychosis ([place]) Active Problems:   Chronic systolic CHF (congestive heart failure) (HCC)   Pacemaker   Diabetes mellitus without complication (HCC)   Hypertension   GERD (gastroesophageal reflux disease)   Cocaine use disorder (HCC)   Sleep apnea  Total Time spent with patient: 30 minutes  Past Psychiatric History: Past history of recurrent depression and substance abuse  Past Medical History:  Past Medical History:  Diagnosis Date   Anxiety    Biventricular ICD (implantable cardioverter-defibrillator) in place    Chronic systolic CHF (congestive heart failure) ([place])    Cocaine use    Diabetes mellitus without complication (HCC)    GERD (gastroesophageal reflux disease)    HLD (hyperlipidemia)    Hypertension    Morbid obesity (HCC)    NICM (nonischemic cardiomyopathy) (HCC)    OSA (obstructive sleep apnea)    PTSD (post-traumatic stress disorder)    on Depakote   Refusal of blood transfusions as patient is Jehovah's Witness    Tobacco abuse     Past Surgical History:  Procedure Laterality Date   BIV ICD INSERTION CRT-D     FOOT FRACTURE SURGERY     ROTATOR CUFF REPAIR     TONSILLECTOMY     Family History:  Family History  Problem Relation Age of Onset   Hypertension Mother    Bone cancer Father    Lung cancer Father    Family Psychiatric  History: See  previous Social History:  Social History   Substance and Sexual Activity  Alcohol Use Not Currently     Social History   Substance and Sexual Activity  Drug Use Yes   Types: Cocaine    Social History   Socioeconomic History   Marital status: Legally Separated    Spouse name: Not on file   Number of children: Not on file   Years of education: Not on file   Highest education level: Not on file  Occupational History   Not on file  Tobacco Use   Smoking status: Every Day    Types: Cigarettes   Smokeless tobacco: Never   Tobacco comments:    occasionally  Vaping Use   Vaping Use: Never used  Substance and Sexual Activity   Alcohol use: Not Currently   Drug use: Yes    Types: Cocaine   Sexual activity: Not on file  Other Topics Concern   Not on file  Social History Narrative   Not on file   Social Determinants of Health   Financial Resource Strain: Not on file  Food Insecurity: Not on file  Transportation Needs: Not on file  Physical Activity: Not on file  Stress: Not on file  Social Connections: Not on file   Additional Social History:                         Sleep: Fair  Appetite:  Fair  Current Medications: Current Facility-Administered Medications  Medication Dose Route Frequency Provider Last Rate Last Admin   acetaminophen (TYLENOL) tablet 650 mg  650 mg Oral Q6H PRN Lucky Rathke, FNP   650 mg at 08/09/21 1735   alum & mag hydroxide-simeth (MAALOX/MYLANTA) 200-200-20 MG/5ML suspension 30 mL  30 mL Oral Q4H PRN Lucky Rathke, FNP       buPROPion (WELLBUTRIN XL) 24 hr tablet 300 mg  300 mg Oral Daily Leolia Vinzant T, MD   300 mg at 08/12/21 0831   carvedilol (COREG) tablet 6.25 mg  6.25 mg Oral BID WC Avree Szczygiel, Madie Reno, MD   6.25 mg at 08/12/21 0831   clopidogrel (PLAVIX) tablet 75 mg  75 mg Oral Daily Kanen Mottola, Madie Reno, MD   75 mg at 08/12/21 0831   DULoxetine (CYMBALTA) DR capsule 90 mg  90 mg Oral Daily Della Scrivener, Madie Reno, MD   90 mg at 08/12/21  0831   furosemide (LASIX) tablet 80 mg  80 mg Oral Daily Jasmine Mcbeth T, MD   80 mg at 08/12/21 0831   glycopyrrolate (ROBINUL) injection 0.2 mg  0.2 mg Intravenous Once Mariluz Crespo T, MD       hydrOXYzine (ATARAX) tablet 25 mg  25 mg Oral TID PRN Lucky Rathke, FNP   25 mg at 08/09/21 2139   ibuprofen (ADVIL) tablet 600 mg  600 mg Oral Q6H PRN Aarit Kashuba T, MD       insulin aspart (novoLOG) injection 0-5 Units  0-5 Units Subcutaneous QHS Erendida Wrenn T, MD   2 Units at 08/11/21 2058   insulin aspart (novoLOG) injection 0-9 Units  0-9 Units Subcutaneous TID WC Rhapsody Wolven T, MD   3 Units at 08/12/21 1137   insulin aspart protamine- aspart (NOVOLOG MIX 70/30) injection 60 Units  60 Units Subcutaneous TID PC Labresha Mellor T, MD   60 Units at 08/12/21 1210   magnesium hydroxide (MILK OF MAGNESIA) suspension 30 mL  30 mL Oral Daily PRN Lucky Rathke, FNP       midazolam (VERSED) injection 2 mg  2 mg Intravenous Once Joeanne Robicheaux T, MD       ondansetron (ZOFRAN) injection 4 mg  4 mg Intravenous Once PRN Arita Miss, MD       pantoprazole (PROTONIX) EC tablet 40 mg  40 mg Oral Daily Ellana Kawa, Madie Reno, MD   40 mg at 08/12/21 0831   prazosin (MINIPRESS) capsule 2 mg  2 mg Oral QHS Tamzin Bertling T, MD   2 mg at 08/11/21 2059   rosuvastatin (CRESTOR) tablet 20 mg  20 mg Oral Daily Tierra Divelbiss, Madie Reno, MD   20 mg at 08/12/21 0831   traZODone (DESYREL) tablet 200 mg  200 mg Oral QHS Zori Benbrook T, MD   200 mg at 08/11/21 2100    Lab Results:  Results for orders placed or performed during the hospital encounter of 08/06/21 (from the past 48 hour(s))  Glucose, capillary     Status: Abnormal   Collection Time: 08/10/21  4:12 PM  Result Value Ref Range   Glucose-Capillary 223 (H) 70 - 99 mg/dL    Comment: Glucose reference range applies only to samples taken after fasting for at least 8 hours.  Glucose, capillary     Status: Abnormal   Collection Time: 08/10/21 10:35 PM  Result Value Ref Range    Glucose-Capillary 332 (H) 70 - 99 mg/dL    Comment: Glucose reference range applies only to  samples taken after fasting for at least 8 hours.  Basic metabolic panel     Status: Abnormal   Collection Time: 08/11/21  6:48 AM  Result Value Ref Range   Sodium 136 135 - 145 mmol/L   Potassium 4.1 3.5 - 5.1 mmol/L   Chloride 103 98 - 111 mmol/L   CO2 28 22 - 32 mmol/L   Glucose, Bld 218 (H) 70 - 99 mg/dL    Comment: Glucose reference range applies only to samples taken after fasting for at least 8 hours.   BUN 11 6 - 20 mg/dL   Creatinine, Ser 0.65 0.61 - 1.24 mg/dL   Calcium 8.4 (L) 8.9 - 10.3 mg/dL   GFR, Estimated >60 >60 mL/min    Comment: (NOTE) Calculated using the CKD-EPI Creatinine Equation (2021)    Anion gap 5 5 - 15    Comment: Performed at [facility], [address] [place], [place] [address]  Glucose, capillary     Status: Abnormal   Collection Time: 08/11/21  1:52 PM  Result Value Ref Range   Glucose-Capillary 169 (H) 70 - 99 mg/dL    Comment: Glucose reference range applies only to samples taken after fasting for at least 8 hours.  Glucose, capillary     Status: Abnormal   Collection Time: 08/11/21  4:16 PM  Result Value Ref Range   Glucose-Capillary 159 (H) 70 - 99 mg/dL    Comment: Glucose reference range applies only to samples taken after fasting for at least 8 hours.  Glucose, capillary     Status: Abnormal   Collection Time: 08/11/21  8:49 PM  Result Value Ref Range   Glucose-Capillary 208 (H) 70 - 99 mg/dL    Comment: Glucose reference range applies only to samples taken after fasting for at least 8 hours.  Glucose, capillary     Status: Abnormal   Collection Time: 08/12/21  6:29 AM  Result Value Ref Range   Glucose-Capillary 244 (H) 70 - 99 mg/dL    Comment: Glucose reference range applies only to samples taken after fasting for at least 8 hours.  Glucose, capillary     Status: Abnormal   Collection Time: 08/12/21 11:26 AM  Result Value Ref  Range   Glucose-Capillary 211 (H) 70 - 99 mg/dL    Comment: Glucose reference range applies only to samples taken after fasting for at least 8 hours.    Blood Alcohol level:  Lab Results  Component Value Date   ETH <10 08/05/2021   ETH <10 123456    Metabolic Disorder Labs: Lab Results  Component Value Date   HGBA1C 10.0 (H) 08/05/2021   MPG 240.3 08/05/2021   MPG 205.86 01/27/2021   No results found for: PROLACTIN Lab Results  Component Value Date   CHOL 143 08/05/2021   TRIG 245 (H) 08/05/2021   HDL 33 (L) 08/05/2021   CHOLHDL 4.3 08/05/2021   VLDL 49 (H) 08/05/2021   LDLCALC 61 08/05/2021   LDLCALC 71 01/27/2021    Physical Findings: AIMS:  , ,  ,  ,    CIWA:    COWS:     Musculoskeletal: Strength & Muscle Tone: within normal limits Gait & Station: normal Patient leans: N/A  Psychiatric Specialty Exam:  Presentation  General Appearance: Disheveled  Eye Contact:Minimal  Speech:Clear and Coherent; Normal Rate  Speech Volume:Normal  Handedness:Right   Mood and Affect  Mood:Irritable  Affect:Congruent; Depressed   Thought Process  Thought Processes:Coherent  Descriptions of Associations:Intact  Orientation:Full (Time, Place and Person)  Thought Content:Logical  History of Schizophrenia/Schizoaffective disorder:No  Duration of Psychotic Symptoms:No data recorded Hallucinations:No data recorded Ideas of Reference:None  Suicidal Thoughts:No data recorded Homicidal Thoughts:No data recorded  Sensorium  Memory:Immediate Good; Recent Good; Remote Good  Judgment:Poor  Insight:Poor   Executive Functions  Concentration:Good  Attention Span:Good  [place] of Knowledge:Good  Language:Good   Psychomotor Activity  Psychomotor Activity:No data recorded  Assets  Assets:Communication Skills; Desire for Improvement; Housing; Intimacy; Leisure Time; Physical Health; Resilience; Social Support   Sleep  Sleep:No data  recorded   Physical Exam: Physical Exam Vitals and nursing note reviewed.  Constitutional:      Appearance: Normal appearance.  HENT:     Head: Normocephalic and atraumatic.     Mouth/Throat:     Pharynx: Oropharynx is clear.  Eyes:     Pupils: Pupils are equal, round, and reactive to light.  Cardiovascular:     Rate and Rhythm: Normal rate and regular rhythm.  Pulmonary:     Effort: Pulmonary effort is normal.     Breath sounds: Normal breath sounds.  Abdominal:     General: Abdomen is flat.     Palpations: Abdomen is soft.  Musculoskeletal:        General: Normal range of motion.  Skin:    General: Skin is warm and dry.  Neurological:     General: No focal deficit present.     Mental Status: He is alert. Mental status is at baseline.  Psychiatric:        Attention and Perception: Attention normal.        Mood and Affect: Mood is depressed. Affect is blunt and tearful.        Speech: Speech normal.        Behavior: Behavior is slowed.        Thought Content: Thought content includes suicidal ideation. Thought content does not include suicidal plan.        Cognition and Memory: Cognition normal.        Judgment: Judgment is impulsive.   Review of Systems  Constitutional: Negative.   HENT: Negative.    Eyes: Negative.   Respiratory: Negative.    Cardiovascular: Negative.   Gastrointestinal: Negative.   Musculoskeletal: Negative.   Skin: Negative.   Neurological: Negative.   Psychiatric/Behavioral:  Positive for depression, substance abuse and suicidal ideas. The patient is nervous/anxious and has insomnia.   Blood pressure 134/69, pulse 88, temperature 98.6 F (37 C), temperature source Oral, resp. rate 18, height 5\' 5"  (1.651 m), weight 123.4 kg, SpO2 95 %. Body mass index is 45.26 kg/m.   Treatment Plan Summary: Medication management and Plan no change to current medication.  Next ECT treatment tomorrow.  Spent some time listening with him doing some supportive  and educational counseling.  Alethia Berthold, MD 08/12/2021, 2:12 PM

## 2021-08-12 NOTE — Progress Notes (Signed)
Blood Sugar 208  2 units Insulin received.  Tolerated without incident.      C [name]-Nicholson, LPN

## 2021-08-12 NOTE — Progress Notes (Signed)
Pt presents sad stating that he misses his late wife on this special day because they have known each other since they were 51 yrs old. He however denied SI/HI/AVH or self harm thoughts and that he feels hopefull. Pt was noted with +1 pitting edema on his BLE, report some discomfort "every now and then." He complied with BS check with a reading 225 mg/dl.  He was med compliant and was later observed interacting with his peers in the dayroom. No falls or unsafe behavior noted thus far. Q15 min observations maintained for safety and support provided as needed.

## 2021-08-12 NOTE — Progress Notes (Signed)
D: Pt alert and oriented. Pt rates depression 8/10, hopelessness 10/10, and anxiety 10/10. Pt reports energy level as low. Pt reports sleep last night as being good. Pt did receive medications for sleep and did find them helpful. Pt reports experiencing back and foot pain at this time, pt refused prn pain management meds however accepted an ice pack. Pt denies experiencing any SI/HI, or AVH at this time.   Pt has c/o still being hungry after meals stating it's just not enough. Pt educated about diabetes and nutrition. Pt wanting all meals held until he returns from ECT stating he doesn't care if it's cold either.   A: Scheduled medications administered to pt, per MD orders. Support and encouragement provided. Frequent verbal contact made. Routine safety checks conducted q15 minutes.   R: No adverse drug reactions noted. Pt verbally contracts for safety at this time. Pt complaint with medications. Pt interacts well with others on the unit. Pt remains safe at this time. Will continue to monitor.

## 2021-08-13 ENCOUNTER — Encounter: Payer: Self-pay | Admitting: Family

## 2021-08-13 ENCOUNTER — Inpatient Hospital Stay: Payer: Medicare Other | Admitting: Certified Registered"

## 2021-08-13 ENCOUNTER — Ambulatory Visit: Payer: Medicare Other | Attending: Psychiatry

## 2021-08-13 DIAGNOSIS — F332 Major depressive disorder, recurrent severe without psychotic features: Secondary | ICD-10-CM | POA: Diagnosis not present

## 2021-08-13 DIAGNOSIS — E119 Type 2 diabetes mellitus without complications: Secondary | ICD-10-CM | POA: Insufficient documentation

## 2021-08-13 DIAGNOSIS — F17210 Nicotine dependence, cigarettes, uncomplicated: Secondary | ICD-10-CM | POA: Insufficient documentation

## 2021-08-13 LAB — GLUCOSE, CAPILLARY
Glucose-Capillary: 159 mg/dL — ABNORMAL HIGH (ref 70–99)
Glucose-Capillary: 225 mg/dL — ABNORMAL HIGH (ref 70–99)
Glucose-Capillary: 201 mg/dL — ABNORMAL HIGH (ref 70–99)
Glucose-Capillary: 177 mg/dL — ABNORMAL HIGH (ref 70–99)
Glucose-Capillary: 147 mg/dL — ABNORMAL HIGH (ref 70–99)

## 2021-08-13 MED ORDER — MIDAZOLAM HCL 2 MG/2ML IJ SOLN: 2.0000 mg | Freq: Once | INTRAMUSCULAR | Status: DC

## 2021-08-13 MED ORDER — GLYCOPYRROLATE 0.2 MG/ML IJ SOLN
INTRAMUSCULAR | Status: AC
  Filled 2021-08-13: qty 1

## 2021-08-13 MED ORDER — GLYCOPYRROLATE 0.2 MG/ML IJ SOLN: 0.1000 mg | Freq: Once | INTRAMUSCULAR | Status: DC

## 2021-08-13 MED ORDER — METHOHEXITAL SODIUM 100 MG/10ML IV SOSY
PREFILLED_SYRINGE | INTRAVENOUS | Status: DC | PRN
  Administered 2021-08-13: 120 mg via INTRAVENOUS

## 2021-08-13 MED ORDER — MIDAZOLAM HCL 2 MG/2ML IJ SOLN
INTRAMUSCULAR | Status: AC
  Filled 2021-08-13: qty 2

## 2021-08-13 MED ORDER — SUCCINYLCHOLINE CHLORIDE 200 MG/10ML IV SOSY
PREFILLED_SYRINGE | INTRAVENOUS | Status: DC | PRN
  Administered 2021-08-13: 150 mg via INTRAVENOUS

## 2021-08-13 MED ORDER — LABETALOL HCL 5 MG/ML IV SOLN
INTRAVENOUS | Status: DC | PRN
  Administered 2021-08-13: 10 mg via INTRAVENOUS

## 2021-08-13 MED ORDER — SODIUM CHLORIDE 0.9 % IV SOLN: 500.0000 mL | Freq: Once | INTRAVENOUS | Status: AC

## 2021-08-13 MED ORDER — SUCCINYLCHOLINE CHLORIDE 200 MG/10ML IV SOSY
PREFILLED_SYRINGE | INTRAVENOUS | Status: AC
  Filled 2021-08-13: qty 10

## 2021-08-13 NOTE — Anesthesia Preprocedure Evaluation (Signed)
Anesthesia Evaluation  Patient identified by MRN, date of birth, ID band Patient awake    Reviewed: Allergy & Precautions, NPO status , Patient's Chart, lab work & pertinent test results  History of Anesthesia Complications Negative for: history of anesthetic complications  Airway Mallampati: II  TM Distance: >3 FB Neck ROM: Full    Dental no notable dental hx. (+) Teeth Intact   Pulmonary neg shortness of breath, sleep apnea , neg COPD, neg recent URI, Current Smoker and Patient abstained from smoking.,    Pulmonary exam normal breath sounds clear to auscultation       Cardiovascular Exercise Tolerance: Good METShypertension, (-) angina+CHF  (-) CAD and (-) Past MI (-) dysrhythmias + Cardiac Defibrillator  Rhythm:Regular Rate:Normal - Systolic murmurs 1. No LV thrombus is seen. There is marked septal-lateral wall left  ventricular dyssynchrony. Left ventricular ejection fraction, by  estimation, is 30 to 35%. The left ventricle has moderately decreased  function. The left ventricle demonstrates global  hypokinesis. Left ventricular diastolic parameters are consistent with  Grade II diastolic dysfunction (pseudonormalization). Elevated left atrial  pressure.  2. Right ventricular systolic function is normal. The right ventricular  size is normal. Tricuspid regurgitation signal is inadequate for assessing  PA pressure.  3. Left atrial size was moderately dilated.  4. The mitral valve is normal in structure. Mild mitral valve  regurgitation. No evidence of mitral stenosis.  5. The aortic valve is normal in structure. Aortic valve regurgitation is  not visualized. No aortic stenosis is present.  6. The inferior vena cava is dilated in size with <50% respiratory  variability, suggesting right atrial pressure of 15 mmHg.    Neuro/Psych PSYCHIATRIC DISORDERS Anxiety Depression negative neurological ROS     GI/Hepatic GERD   ,(+)     substance abuse  cocaine use, Patient cocaine positive on admission 08/05/21. Has been in hospital since then. Patient denies cocaine use this month. It is more than 5 days out since last positive UDS on admission, patient has remained in hospital. Reasonable to proceed without repeat UDS   Endo/Other  diabetesMorbid obesity  Renal/GU negative Renal ROS     Musculoskeletal   Abdominal (+) + obese,   Peds  Hematology   Anesthesia Other Findings Past Medical History: No date: Anxiety No date: Biventricular ICD (implantable cardioverter-defibrillator)  in place No date: Chronic systolic CHF (congestive heart failure) (HCC) No date: Cocaine use No date: Diabetes mellitus without complication (HCC) No date: GERD (gastroesophageal reflux disease) No date: HLD (hyperlipidemia) No date: Hypertension No date: Morbid obesity (HCC) No date: NICM (nonischemic cardiomyopathy) ([place]) No date: OSA (obstructive sleep apnea) No date: PTSD (post-traumatic stress disorder)     Comment:  on Depakote No date: Refusal of blood transfusions as patient is Jehovah's Witness No date: Tobacco abuse  Reproductive/Obstetrics                             Anesthesia Physical  Anesthesia Plan  ASA: 4  Anesthesia Plan: General   Post-op Pain Management: Minimal or no pain anticipated   Induction: Intravenous  PONV Risk Score and Plan: 1 and Ondansetron and TIVA  Airway Management Planned: Mask  Additional Equipment: None  Intra-op Plan:   Post-operative Plan:   Informed Consent: I have reviewed the patients History and Physical, chart, labs and discussed the procedure including the risks, benefits and alternatives for the proposed anesthesia with the patient or authorized representative who has  indicated his/her understanding and acceptance.     Dental advisory given  Plan Discussed with: CRNA and Surgeon  Anesthesia Plan Comments: (Discussed risks of  anesthesia with patient, including PONV, muscle aches. Rare risks discussed as well, such as cardiorespiratory sequelae, need for airway intervention and its associated risks including lip/dental/eye damage and sore throat, and allergic reactions. Discussed the role of CRNA in patient's perioperative care. Patient understands. Patient counseled on being higher risk for anesthesia due to comorbidities: morbid obesity, HFrEF. Patient was told about increased risk of cardiac and respiratory events, including death. )        Anesthesia Quick Evaluation

## 2021-08-13 NOTE — Progress Notes (Signed)
Recreation Therapy Notes  Date: 08/13/2021  Time: 10:45 am    Location: Craft room  Behavioral response: N/A   Intervention Topic: Stress Management   Discussion/Intervention: Patient refused to attend group.   Clinical Observations/Feedback:  Patient refused to attend group.    Serinity Ware LRT/CTRS        Danyeal Akens 08/13/2021 12:35 PM

## 2021-08-13 NOTE — Group Note (Signed)
LCSW Group Therapy Note  Group Date: 08/13/2021 Start Time: 1300 End Time: 1400   Type of Therapy and Topic:  Group Therapy - How To Cope with Nervousness about Discharge   Participation Level:  Did Not Attend   Description of Group This process group involved identification of patients' feelings about discharge. Some of them are scheduled to be discharged soon, while others are new admissions, but each of them was asked to share thoughts and feelings surrounding discharge from the hospital. One common theme was that they are excited at the prospect of going home, while another was that many of them are apprehensive about sharing why they were hospitalized. Patients were given the opportunity to discuss these feelings with their peers in preparation for discharge.  Therapeutic Goals  Patient will identify their overall feelings about pending discharge. Patient will think about how they might proactively address issues that they believe will once again arise once they get home (i.e. with parents). Patients will participate in discussion about having hope for change.   Summary of Patient Progress:   X   Therapeutic Modalities Cognitive Behavioral Therapy   Claudie Fisherman 08/13/2021  2:32 PM

## 2021-08-13 NOTE — Transfer of Care (Signed)
Immediate Anesthesia Transfer of Care Note  Patient: [name]  Procedure(s) Performed: ECT TX  Patient Location: PACU  Anesthesia Type:General  Level of Consciousness: awake and drowsy  Airway & Oxygen Therapy: Patient Spontanous Breathing and Patient connected to face mask oxygen  Post-op Assessment: Report given to RN and Post -op Vital signs reviewed and stable  Post vital signs: Reviewed and stable  Last Vitals:  Vitals Value Taken Time  BP 136/77 08/13/21 1400  Temp    Pulse 85 08/13/21 1403  Resp 23 08/13/21 1403  SpO2 94 % 08/13/21 1403  Vitals shown include unvalidated device data.  Last Pain:  Vitals:   08/13/21 1207  TempSrc:   PainSc: 0-No pain      Patients Stated Pain Goal: 0 (08/13/21 0850)  Complications: No notable events documented.

## 2021-08-13 NOTE — Anesthesia Postprocedure Evaluation (Signed)
Anesthesia Post Note  Patient: Luis [name]  Procedure(s) Performed: ECT TX  Patient location during evaluation: PACU Anesthesia Type: General Level of consciousness: awake and sedated Vital Signs Assessment: post-procedure vital signs reviewed and stable Respiratory status: spontaneous breathing and nonlabored ventilation Cardiovascular status: blood pressure returned to baseline Anesthetic complications: no   No notable events documented.   Last Vitals:  Vitals:   08/13/21 1204 08/13/21 1400  BP: 136/74 136/77  Pulse: 87 90  Resp: 18 17  Temp: 37 C 36.6 C  SpO2: 98% (!) 86%    Last Pain:  Vitals:   08/13/21 1400  TempSrc:   PainSc: 0-No pain                 VAN STAVEREN,Martavia Tye

## 2021-08-13 NOTE — H&P (Signed)
Luis [name] is an 51 y.o. male.   Chief Complaint: Continues to be depressed sad and down hopeless HPI: History of recurrent depression  Past Medical History:  Diagnosis Date   Anxiety    Biventricular ICD (implantable cardioverter-defibrillator) in place    Chronic systolic CHF (congestive heart failure) (HCC)    Cocaine use    Diabetes mellitus without complication (HCC)    GERD (gastroesophageal reflux disease)    HLD (hyperlipidemia)    Hypertension    Morbid obesity (HCC)    NICM (nonischemic cardiomyopathy) (HCC)    OSA (obstructive sleep apnea)    PTSD (post-traumatic stress disorder)    on Depakote   Refusal of blood transfusions as patient is Jehovah's Witness    Tobacco abuse     Past Surgical History:  Procedure Laterality Date   BIV ICD INSERTION CRT-D     FOOT FRACTURE SURGERY     ROTATOR CUFF REPAIR     TONSILLECTOMY      Family History  Problem Relation Age of Onset   Hypertension Mother    Bone cancer Father    Lung cancer Father    Social History:  reports that he has been smoking cigarettes. He has never used smokeless tobacco. He reports that he does not currently use alcohol. He reports current drug use. Drug: Cocaine.  Allergies:  Allergies  Allergen Reactions   Aspirin Anaphylaxis and Other (See Comments)    Throat closing    Iodine-131 Hives   Iodinated Contrast Media Hives   Latex Hives and Rash   Penicillins Nausea And Vomiting   Tramadol Hives and Nausea Only   Amoxicillin-Pot Clavulanate Diarrhea   Lidocaine Rash   Lisinopril Cough    Medications Prior to Admission  Medication Sig Dispense Refill   Artificial Tear Solution (TEARS NATURALE OP) Place 2 drops into both eyes in the morning and at bedtime.     buPROPion (WELLBUTRIN XL) 150 MG 24 hr tablet Take 1 tablet (150 mg total) by mouth every morning. 30 tablet 1   carvedilol (COREG) 6.25 MG tablet Take 6.25 mg by mouth 2 (two) times daily.     clopidogrel (PLAVIX) 75  MG tablet Take 75 mg by mouth daily.     clotrimazole-betamethasone (LOTRISONE) cream Apply 1 application topically in the morning and at bedtime. Applied to affected area.     divalproex (DEPAKOTE) 500 MG DR tablet Take 1 tablet (500 mg total) by mouth 2 (two) times daily. 60 tablet 1   DULoxetine (CYMBALTA) 30 MG capsule Take 90 mg by mouth daily.     furosemide (LASIX) 80 MG tablet Take 80 mg by mouth daily.     HUMALOG MIX 75/25 KWIKPEN (75-25) 100 UNIT/ML Kwikpen Inject 20 Units into the skin 2 (two) times daily before a meal. 15 mL 11   MELATONIN GUMMIES PO Take 3 tablets by mouth at bedtime.     Multiple Vitamin (MULTIVITAMIN WITH MINERALS) TABS tablet Take 1 tablet by mouth daily.     oxyCODONE-acetaminophen (PERCOCET) 7.5-325 MG tablet Take 0.5-1 tablets by mouth every 12 (twelve) hours as needed for severe pain.     pantoprazole (PROTONIX) 40 MG tablet Take 40 mg by mouth daily.     prazosin (MINIPRESS) 2 MG capsule Take 1 capsule (2 mg total) by mouth at bedtime. 30 capsule 1   ramelteon (ROZEREM) 8 MG tablet Take 1 tablet (8 mg total) by mouth at bedtime. 30 tablet 1   rosuvastatin (CRESTOR) 20 MG  tablet Take 20 mg by mouth every evening.     sertraline (ZOLOFT) 100 MG tablet Take 2 tablets (200 mg total) by mouth daily. 60 tablet 1   traZODone (DESYREL) 100 MG tablet Take 200 mg by mouth at bedtime.     TRULICITY 1.5 MG/0.5ML SOPN Inject 1.5 mg into the skin once a week.     VICTOZA 18 MG/3ML SOPN Inject 1.8 mg into the skin daily. 9 mL 0    Results for orders placed or performed during the hospital encounter of 08/06/21 (from the past 48 hour(s))  Glucose, capillary     Status: Abnormal   Collection Time: 08/11/21  8:49 PM  Result Value Ref Range   Glucose-Capillary 208 (H) 70 - 99 mg/dL    Comment: Glucose reference range applies only to samples taken after fasting for at least 8 hours.  Glucose, capillary     Status: Abnormal   Collection Time: 08/12/21  6:29 AM  Result  Value Ref Range   Glucose-Capillary 244 (H) 70 - 99 mg/dL    Comment: Glucose reference range applies only to samples taken after fasting for at least 8 hours.  Glucose, capillary     Status: Abnormal   Collection Time: 08/12/21 11:26 AM  Result Value Ref Range   Glucose-Capillary 211 (H) 70 - 99 mg/dL    Comment: Glucose reference range applies only to samples taken after fasting for at least 8 hours.  Glucose, capillary     Status: Abnormal   Collection Time: 08/12/21  4:13 PM  Result Value Ref Range   Glucose-Capillary 159 (H) 70 - 99 mg/dL    Comment: Glucose reference range applies only to samples taken after fasting for at least 8 hours.   Comment 1 Notify RN   Glucose, capillary     Status: Abnormal   Collection Time: 08/12/21  8:55 PM  Result Value Ref Range   Glucose-Capillary 225 (H) 70 - 99 mg/dL    Comment: Glucose reference range applies only to samples taken after fasting for at least 8 hours.   Comment 1 Notify RN   Glucose, capillary     Status: Abnormal   Collection Time: 08/13/21  6:53 AM  Result Value Ref Range   Glucose-Capillary 201 (H) 70 - 99 mg/dL    Comment: Glucose reference range applies only to samples taken after fasting for at least 8 hours.  Glucose, capillary     Status: Abnormal   Collection Time: 08/13/21  2:08 PM  Result Value Ref Range   Glucose-Capillary 177 (H) 70 - 99 mg/dL    Comment: Glucose reference range applies only to samples taken after fasting for at least 8 hours.  Glucose, capillary     Status: Abnormal   Collection Time: 08/13/21  2:59 PM  Result Value Ref Range   Glucose-Capillary 147 (H) 70 - 99 mg/dL    Comment: Glucose reference range applies only to samples taken after fasting for at least 8 hours.   No results found.  Review of Systems  Constitutional: Negative.   HENT: Negative.    Eyes: Negative.   Respiratory: Negative.    Cardiovascular: Negative.   Gastrointestinal: Negative.   Musculoskeletal: Negative.    Skin: Negative.   Neurological: Negative.   Psychiatric/Behavioral:  Positive for dysphoric mood.    Blood pressure 131/66, pulse 81, temperature 97.8 F (36.6 C), temperature source Oral, resp. rate (!) 26, height 5\' 5"  (1.651 m), weight 123.8 kg, SpO2 96 %. Physical Exam  Vitals reviewed.  Constitutional:      Appearance: He is well-developed.  HENT:     Head: Normocephalic and atraumatic.  Eyes:     Conjunctiva/sclera: Conjunctivae normal.     Pupils: Pupils are equal, round, and reactive to light.  Cardiovascular:     Heart sounds: Normal heart sounds.  Pulmonary:     Effort: Pulmonary effort is normal.  Abdominal:     Palpations: Abdomen is soft.  Musculoskeletal:        General: Normal range of motion.     Cervical back: Normal range of motion.  Skin:    General: Skin is warm and dry.  Neurological:     General: No focal deficit present.     Mental Status: He is alert.  Psychiatric:        Mood and Affect: Mood is depressed.     Assessment/Plan ECT treatment #2 today  Mordecai Rasmussen, MD 08/13/2021, 5:36 PM

## 2021-08-13 NOTE — BH IP Treatment Plan (Signed)
Interdisciplinary Treatment and Diagnostic Plan Update  08/13/2021 Time of Session: [place] MRN: [id number]  Principal Diagnosis: MDD (major depressive disorder), recurrent severe, without psychosis ([place])  Secondary Diagnoses: Principal Problem:   MDD (major depressive disorder), recurrent severe, without psychosis ([place]) Active Problems:   Chronic systolic CHF (congestive heart failure) ([place])   Pacemaker   Diabetes mellitus without complication ([place])   Hypertension   GERD (gastroesophageal reflux disease)   Cocaine use disorder ([place])   Sleep apnea   Current Medications:  Current Facility-Administered Medications  Medication Dose Route Frequency Provider Last Rate Last Admin   acetaminophen (TYLENOL) tablet 650 mg  650 mg Oral Q6H PRN Lucky Rathke, FNP   650 mg at 08/09/21 1735   alum & mag hydroxide-simeth (MAALOX/MYLANTA) 200-200-20 MG/5ML suspension 30 mL  30 mL Oral Q4H PRN Lucky Rathke, FNP       buPROPion (WELLBUTRIN XL) 24 hr tablet 300 mg  300 mg Oral Daily Clapacs, John T, MD   300 mg at 08/13/21 0850   carvedilol (COREG) tablet 6.25 mg  6.25 mg Oral BID WC Clapacs, John T, MD   6.25 mg at 08/13/21 0851   clopidogrel (PLAVIX) tablet 75 mg  75 mg Oral Daily Clapacs, John T, MD   75 mg at 08/13/21 0850   DULoxetine (CYMBALTA) DR capsule 90 mg  90 mg Oral Daily Clapacs, Madie Reno, MD   90 mg at 08/13/21 0850   furosemide (LASIX) tablet 80 mg  80 mg Oral Daily Clapacs, John T, MD   80 mg at 08/13/21 [id number]   glycopyrrolate (ROBINUL) injection 0.2 mg  0.2 mg Intravenous Once Clapacs, John T, MD       hydrOXYzine (ATARAX) tablet 25 mg  25 mg Oral TID PRN Lucky Rathke, FNP   25 mg at 08/09/21 2139   ibuprofen (ADVIL) tablet 600 mg  600 mg Oral Q6H PRN Clapacs, John T, MD       insulin aspart (novoLOG) injection 0-5 Units  0-5 Units Subcutaneous QHS Clapacs, John T, MD   2 Units at 08/12/21 2116   insulin aspart (novoLOG) injection 0-9 Units  0-9 Units Subcutaneous  TID WC Clapacs, John T, MD   2 Units at 08/12/21 1637   insulin aspart protamine- aspart (NOVOLOG MIX 70/30) injection 60 Units  60 Units Subcutaneous TID PC Clapacs, John T, MD   60 Units at 08/12/21 1737   magnesium hydroxide (MILK OF MAGNESIA) suspension 30 mL  30 mL Oral Daily PRN Lucky Rathke, FNP       midazolam (VERSED) injection 2 mg  2 mg Intravenous Once Clapacs, John T, MD       ondansetron (ZOFRAN) injection 4 mg  4 mg Intravenous Once PRN Arita Miss, MD       pantoprazole (PROTONIX) EC tablet 40 mg  40 mg Oral Daily Clapacs, Madie Reno, MD   40 mg at 08/13/21 0851   prazosin (MINIPRESS) capsule 2 mg  2 mg Oral QHS Clapacs, John T, MD   2 mg at 08/12/21 2121   rosuvastatin (CRESTOR) tablet 20 mg  20 mg Oral Daily Clapacs, Madie Reno, MD   20 mg at 08/13/21 0851   traZODone (DESYREL) tablet 200 mg  200 mg Oral QHS Clapacs, John T, MD   200 mg at 08/12/21 2121   PTA Medications: Medications Prior to Admission  Medication Sig Dispense Refill Last Dose   Artificial Tear Solution (TEARS NATURALE OP) Place 2 drops  into both eyes in the morning and at bedtime.   08/10/2021   buPROPion (WELLBUTRIN XL) 150 MG 24 hr tablet Take 1 tablet (150 mg total) by mouth every morning. 30 tablet 1 08/10/2021   carvedilol (COREG) 6.25 MG tablet Take 6.25 mg by mouth 2 (two) times daily.   08/10/2021   clopidogrel (PLAVIX) 75 MG tablet Take 75 mg by mouth daily.   08/10/2021   clotrimazole-betamethasone (LOTRISONE) cream Apply 1 application topically in the morning and at bedtime. Applied to affected area.   08/10/2021   divalproex (DEPAKOTE) 500 MG DR tablet Take 1 tablet (500 mg total) by mouth 2 (two) times daily. 60 tablet 1 08/10/2021   DULoxetine (CYMBALTA) 30 MG capsule Take 90 mg by mouth daily.   08/10/2021   furosemide (LASIX) 80 MG tablet Take 80 mg by mouth daily.   08/10/2021   HUMALOG MIX 75/25 KWIKPEN (75-25) 100 UNIT/ML Kwikpen Inject 20 Units into the skin 2 (two) times daily before a meal. 15 mL 11  08/10/2021   MELATONIN GUMMIES PO Take 3 tablets by mouth at bedtime.   08/10/2021   Multiple Vitamin (MULTIVITAMIN WITH MINERALS) TABS tablet Take 1 tablet by mouth daily.   08/10/2021   oxyCODONE-acetaminophen (PERCOCET) 7.5-325 MG tablet Take 0.5-1 tablets by mouth every 12 (twelve) hours as needed for severe pain.   08/10/2021   pantoprazole (PROTONIX) 40 MG tablet Take 40 mg by mouth daily.   08/10/2021   prazosin (MINIPRESS) 2 MG capsule Take 1 capsule (2 mg total) by mouth at bedtime. 30 capsule 1 08/10/2021   ramelteon (ROZEREM) 8 MG tablet Take 1 tablet (8 mg total) by mouth at bedtime. 30 tablet 1 08/10/2021   rosuvastatin (CRESTOR) 20 MG tablet Take 20 mg by mouth every evening.   08/10/2021   sertraline (ZOLOFT) 100 MG tablet Take 2 tablets (200 mg total) by mouth daily. 60 tablet 1 08/10/2021   traZODone (DESYREL) 100 MG tablet Take 200 mg by mouth at bedtime.   [id number]   TRULICITY 1.5 0000000 SOPN Inject 1.5 mg into the skin once a week.   08/10/2021   VICTOZA 18 MG/3ML SOPN Inject 1.8 mg into the skin daily. 9 mL 0     Patient Stressors: Financial difficulties    Patient Strengths: Supportive family/friends   Treatment Modalities: Medication Management, Group therapy, Case management,  1 to 1 session with clinician, Psychoeducation, Recreational therapy.   Physician Treatment Plan for Primary Diagnosis: MDD (major depressive disorder), recurrent severe, without psychosis ([place]) Long Term Goal(s): Improvement in symptoms so as ready for discharge   Short Term Goals: Ability to identify and develop effective coping behaviors will improve Ability to maintain clinical measurements within normal limits will improve Compliance with prescribed medications will improve Ability to disclose and discuss suicidal ideas Ability to demonstrate self-control will improve  Medication Management: Evaluate patient's response, side effects, and tolerance of medication regimen.  Therapeutic  Interventions: 1 to 1 sessions, Unit Group sessions and Medication administration.  Evaluation of Outcomes: Progressing  Physician Treatment Plan for Secondary Diagnosis: Principal Problem:   MDD (major depressive disorder), recurrent severe, without psychosis ([place]) Active Problems:   Chronic systolic CHF (congestive heart failure) (HCC)   Pacemaker   Diabetes mellitus without complication (HCC)   Hypertension   GERD (gastroesophageal reflux disease)   Cocaine use disorder (HCC)   Sleep apnea  Long Term Goal(s): Improvement in symptoms so as ready for discharge   Short Term Goals: Ability to identify and  develop effective coping behaviors will improve Ability to maintain clinical measurements within normal limits will improve Compliance with prescribed medications will improve Ability to disclose and discuss suicidal ideas Ability to demonstrate self-control will improve     Medication Management: Evaluate patient's response, side effects, and tolerance of medication regimen.  Therapeutic Interventions: 1 to 1 sessions, Unit Group sessions and Medication administration.  Evaluation of Outcomes: Progressing   RN Treatment Plan for Primary Diagnosis: MDD (major depressive disorder), recurrent severe, without psychosis ([place]) Long Term Goal(s): Knowledge of disease and therapeutic regimen to maintain health will improve  Short Term Goals: Ability to remain free from injury will improve, Ability to verbalize frustration and anger appropriately will improve, Ability to demonstrate self-control, Ability to participate in decision making will improve, Ability to verbalize feelings will improve, Ability to disclose and discuss suicidal ideas, Ability to identify and develop effective coping behaviors will improve, and Compliance with prescribed medications will improve  Medication Management: RN will administer medications as ordered by provider, will assess and evaluate patient's response  and provide education to patient for prescribed medication. RN will report any adverse and/or side effects to prescribing provider.  Therapeutic Interventions: 1 on 1 counseling sessions, Psychoeducation, Medication administration, Evaluate responses to treatment, Monitor vital signs and CBGs as ordered, Perform/monitor CIWA, COWS, AIMS and Fall Risk screenings as ordered, Perform wound care treatments as ordered.  Evaluation of Outcomes: Progressing   LCSW Treatment Plan for Primary Diagnosis: MDD (major depressive disorder), recurrent severe, without psychosis ([place]) Long Term Goal(s): Safe transition to appropriate next level of care at discharge, Engage patient in therapeutic group addressing interpersonal concerns.  Short Term Goals: Engage patient in aftercare planning with referrals and resources, Increase social support, Increase ability to appropriately verbalize feelings, Increase emotional regulation, Facilitate acceptance of mental health diagnosis and concerns, Facilitate patient progression through stages of change regarding substance use diagnoses and concerns, Identify triggers associated with mental health/substance abuse issues, and Increase skills for wellness and recovery  Therapeutic Interventions: Assess for all discharge needs, 1 to 1 time with Social worker, Explore available resources and support systems, Assess for adequacy in community support network, Educate family and significant other(s) on suicide prevention, Complete Psychosocial Assessment, Interpersonal group therapy.  Evaluation of Outcomes: Progressing   Progress in Treatment: Attending groups: Yes. Participating in groups: No. Taking medication as prescribed: Yes. Toleration medication: Yes. Family/Significant other contact made: Yes, individual(s) contacted:  CSW reached Minus [place], friend.  Patient understands diagnosis: Yes. Discussing patient identified problems/goals with staff: Yes. Medical  problems stabilized or resolved: Yes. Denies suicidal/homicidal ideation: No. Issues/concerns per patient self-inventory: Yes. Other: none  New problem(s) identified: Yes, Describe:  Patient continues to complain of depressed mood as related to general life circumstances and death of partner.   New Short Term/Long Term Goal(s): Patient to work towards medication management for mood stabilization; elimination of SI thoughts; development of comprehensive mental wellness plan.  Patient Goals:  No additional goals identified at this time. Patient to continue ECT with Dr. Weber Cooks.   Discharge Plan or Barriers: Patient facing impending eviction at the end of the month, however, plan is for patient to return to place of residence when appropriate for discharge.   Reason for Continuation of Hospitalization: Depression Suicidal ideation  Estimated Length of Stay: TBD    Scribe for Treatment Team: Larose Kells 08/13/2021 9:28 AM

## 2021-08-13 NOTE — Procedures (Signed)
ECT SERVICES Physicians Interval Evaluation & Treatment Note  Patient Identification: Luis [name] MRN:  [id number] Date of Evaluation:  08/13/2021 TX #: 2  MADRS:   MMSE:   P.E. Findings:  No change to physical exam  Psychiatric Interval Note:  Remains down and sad much of the time  Subjective:  Patient is a 51 y.o. male seen for evaluation for Electroconvulsive Therapy. No new complaint  Treatment Summary:   [x]   Right Unilateral             []  Bilateral   % Energy : 0.3 ms 80%   Impedance: 1500 ohms  Seizure Energy Index: No reading  Postictal Suppression Index: No reading  Seizure Concordance Index: 29%  Medications  Pre Shock: Robinul 0.1 mg Brevital 120 mg succinylcholine 150 mg  Post Shock: Versed 2 mg  Seizure Duration: EMG 10 seconds the EEG was unreadable.  Certainly had a seizure but it was very low amplitude and hard to ever tell exactly when it turned into baseline.   Comments: Had a seizure but it is hard to judge the effectiveness.  Increased to 100% next time  Lungs:  [x]   Clear to auscultation               []  Other:   Heart:    [x]   Regular rhythm             []  irregular rhythm    [x]   Previous H&P reviewed, patient examined and there are NO CHANGES                 []   Previous H&P reviewed, patient examined and there are changes noted.   Alethia Berthold, MD 2/15/20235:37 PM

## 2021-08-13 NOTE — Progress Notes (Signed)
[facility] MD Progress Note  08/13/2021 5:19 PM Luis [name]  MRN:  [id number] Subjective: Follow-up 51 year old man with depression receiving ECT.  Still reports depressed mood feeling sad much of the time down negative somewhat hopeless.  Tired a lot.  Tolerated first ECT well and second ECT was done today without complication Principal Problem: MDD (major depressive disorder), recurrent severe, without psychosis ([place]) Diagnosis: Principal Problem:   MDD (major depressive disorder), recurrent severe, without psychosis ([place]) Active Problems:   Chronic systolic CHF (congestive heart failure) (HCC)   Pacemaker   Diabetes mellitus without complication (HCC)   Hypertension   GERD (gastroesophageal reflux disease)   Cocaine use disorder (HCC)   Sleep apnea  Total Time spent with patient: 30 minutes  Past Psychiatric History: Past history of depression and substance abuse  Past Medical History:  Past Medical History:  Diagnosis Date   Anxiety    Biventricular ICD (implantable cardioverter-defibrillator) in place    Chronic systolic CHF (congestive heart failure) ([place])    Cocaine use    Diabetes mellitus without complication (HCC)    GERD (gastroesophageal reflux disease)    HLD (hyperlipidemia)    Hypertension    Morbid obesity (HCC)    NICM (nonischemic cardiomyopathy) (HCC)    OSA (obstructive sleep apnea)    PTSD (post-traumatic stress disorder)    on Depakote   Refusal of blood transfusions as patient is Jehovah's Witness    Tobacco abuse     Past Surgical History:  Procedure Laterality Date   BIV ICD INSERTION CRT-D     FOOT FRACTURE SURGERY     ROTATOR CUFF REPAIR     TONSILLECTOMY     Family History:  Family History  Problem Relation Age of Onset   Hypertension Mother    Bone cancer Father    Lung cancer Father    Family Psychiatric  History: See previous Social History:  Social History   Substance and Sexual Activity  Alcohol Use Not Currently      Social History   Substance and Sexual Activity  Drug Use Yes   Types: Cocaine    Social History   Socioeconomic History   Marital status: Legally Separated    Spouse name: Not on file   Number of children: Not on file   Years of education: Not on file   Highest education level: Not on file  Occupational History   Not on file  Tobacco Use   Smoking status: Every Day    Types: Cigarettes   Smokeless tobacco: Never   Tobacco comments:    occasionally  Vaping Use   Vaping Use: Never used  Substance and Sexual Activity   Alcohol use: Not Currently   Drug use: Yes    Types: Cocaine   Sexual activity: Not on file  Other Topics Concern   Not on file  Social History Narrative   Not on file   Social Determinants of Health   Financial Resource Strain: Not on file  Food Insecurity: Not on file  Transportation Needs: Not on file  Physical Activity: Not on file  Stress: Not on file  Social Connections: Not on file   Additional Social History:                         Sleep: Fair  Appetite:  Fair  Current Medications: Current Facility-Administered Medications  Medication Dose Route Frequency Provider Last Rate Last Admin   acetaminophen (TYLENOL) tablet 650 mg  650 mg Oral Q6H PRN Lucky Rathke, FNP   650 mg at 08/09/21 1735   alum & mag hydroxide-simeth (MAALOX/MYLANTA) 200-200-20 MG/5ML suspension 30 mL  30 mL Oral Q4H PRN Lucky Rathke, FNP       buPROPion (WELLBUTRIN XL) 24 hr tablet 300 mg  300 mg Oral Daily Chelsee Hosie T, MD   300 mg at 08/13/21 0850   carvedilol (COREG) tablet 6.25 mg  6.25 mg Oral BID WC Damondre Pfeifle T, MD   6.25 mg at 08/13/21 1643   clopidogrel (PLAVIX) tablet 75 mg  75 mg Oral Daily Murdock Jellison, Madie Reno, MD   75 mg at 08/13/21 0850   DULoxetine (CYMBALTA) DR capsule 90 mg  90 mg Oral Daily Naidelin Gugliotta, Madie Reno, MD   90 mg at 08/13/21 0850   furosemide (LASIX) tablet 80 mg  80 mg Oral Daily Khambrel Amsden T, MD   80 mg at 08/13/21 [id number]    glycopyrrolate (ROBINUL) injection 0.1 mg  0.1 mg Intravenous Once Sophira Rumler T, MD       glycopyrrolate (ROBINUL) injection 0.2 mg  0.2 mg Intravenous Once Kiernan Farkas, Madie Reno, MD       hydrOXYzine (ATARAX) tablet 25 mg  25 mg Oral TID PRN Lucky Rathke, FNP   25 mg at 08/09/21 2139   ibuprofen (ADVIL) tablet 600 mg  600 mg Oral Q6H PRN Conor Lata T, MD       insulin aspart (novoLOG) injection 0-5 Units  0-5 Units Subcutaneous QHS Jashanti Clinkscale T, MD   2 Units at 08/12/21 2116   insulin aspart (novoLOG) injection 0-9 Units  0-9 Units Subcutaneous TID WC Sonu Kruckenberg T, MD   3 Units at 08/13/21 1643   insulin aspart protamine- aspart (NOVOLOG MIX 70/30) injection 60 Units  60 Units Subcutaneous TID PC Bertil Brickey T, MD   60 Units at 08/12/21 1737   magnesium hydroxide (MILK OF MAGNESIA) suspension 30 mL  30 mL Oral Daily PRN Lucky Rathke, FNP       midazolam (VERSED) injection 2 mg  2 mg Intravenous Once Veto Macqueen T, MD       ondansetron (ZOFRAN) injection 4 mg  4 mg Intravenous Once PRN Arita Miss, MD       pantoprazole (PROTONIX) EC tablet 40 mg  40 mg Oral Daily Raynesha Tiedt, Madie Reno, MD   40 mg at 08/13/21 0851   prazosin (MINIPRESS) capsule 2 mg  2 mg Oral QHS [place]ola Depaz T, MD   2 mg at 08/12/21 2121   rosuvastatin (CRESTOR) tablet 20 mg  20 mg Oral Daily Pragya Lofaso, Madie Reno, MD   20 mg at 08/13/21 0851   traZODone (DESYREL) tablet 200 mg  200 mg Oral QHS Johnathon Mittal T, MD   200 mg at 08/12/21 2121    Lab Results:  Results for orders placed or performed during the hospital encounter of 08/06/21 (from the past 48 hour(s))  Glucose, capillary     Status: Abnormal   Collection Time: 08/11/21  8:49 PM  Result Value Ref Range   Glucose-Capillary 208 (H) 70 - 99 mg/dL    Comment: Glucose reference range applies only to samples taken after fasting for at least 8 hours.  Glucose, capillary     Status: Abnormal   Collection Time: 08/12/21  6:29 AM  Result Value Ref Range    Glucose-Capillary 244 (H) 70 - 99 mg/dL    Comment: Glucose reference range applies only to samples taken  after fasting for at least 8 hours.  Glucose, capillary     Status: Abnormal   Collection Time: 08/12/21 11:26 AM  Result Value Ref Range   Glucose-Capillary 211 (H) 70 - 99 mg/dL    Comment: Glucose reference range applies only to samples taken after fasting for at least 8 hours.  Glucose, capillary     Status: Abnormal   Collection Time: 08/12/21  4:13 PM  Result Value Ref Range   Glucose-Capillary 159 (H) 70 - 99 mg/dL    Comment: Glucose reference range applies only to samples taken after fasting for at least 8 hours.   Comment 1 Notify RN   Glucose, capillary     Status: Abnormal   Collection Time: 08/12/21  8:55 PM  Result Value Ref Range   Glucose-Capillary 225 (H) 70 - 99 mg/dL    Comment: Glucose reference range applies only to samples taken after fasting for at least 8 hours.   Comment 1 Notify RN   Glucose, capillary     Status: Abnormal   Collection Time: 08/13/21  6:53 AM  Result Value Ref Range   Glucose-Capillary 201 (H) 70 - 99 mg/dL    Comment: Glucose reference range applies only to samples taken after fasting for at least 8 hours.  Glucose, capillary     Status: Abnormal   Collection Time: 08/13/21  2:08 PM  Result Value Ref Range   Glucose-Capillary 177 (H) 70 - 99 mg/dL    Comment: Glucose reference range applies only to samples taken after fasting for at least 8 hours.  Glucose, capillary     Status: Abnormal   Collection Time: 08/13/21  2:59 PM  Result Value Ref Range   Glucose-Capillary 147 (H) 70 - 99 mg/dL    Comment: Glucose reference range applies only to samples taken after fasting for at least 8 hours.    Blood Alcohol level:  Lab Results  Component Value Date   ETH <10 08/05/2021   ETH <10 [id number]    Metabolic Disorder Labs: Lab Results  Component Value Date   HGBA1C 10.0 (H) 08/05/2021   MPG 240.3 08/05/2021   MPG 205.86  01/27/2021   No results found for: PROLACTIN Lab Results  Component Value Date   CHOL 143 08/05/2021   TRIG 245 (H) 08/05/2021   HDL 33 (L) 08/05/2021   CHOLHDL 4.3 08/05/2021   VLDL 49 (H) 08/05/2021   LDLCALC 61 08/05/2021   LDLCALC 71 01/27/2021    Physical Findings: AIMS:  , ,  ,  ,    CIWA:    COWS:     Musculoskeletal: Strength & Muscle Tone: within normal limits Gait & Station: normal Patient leans: N/A  Psychiatric Specialty Exam:  Presentation  General Appearance: Disheveled  Eye Contact:Minimal  Speech:Clear and Coherent; Normal Rate  Speech Volume:Normal  Handedness:Right   Mood and Affect  Mood:Irritable  Affect:Congruent; Depressed   Thought Process  Thought Processes:Coherent  Descriptions of Associations:Intact  Orientation:Full (Time, Place and Person)  Thought Content:Logical  History of Schizophrenia/Schizoaffective disorder:No  Duration of Psychotic Symptoms:No data recorded Hallucinations:No data recorded Ideas of Reference:None  Suicidal Thoughts:No data recorded Homicidal Thoughts:No data recorded  Sensorium  Memory:Immediate Good; Recent Good; Remote Good  Judgment:Poor  Insight:Poor   Executive Functions  Concentration:Good  Attention Span:Good  [place] of Knowledge:Good  Language:Good   Psychomotor Activity  Psychomotor Activity:No data recorded  Assets  Assets:Communication Skills; Desire for Improvement; Housing; Intimacy; Leisure Time; Physical Health; Resilience; Social Support  Sleep  Sleep:No data recorded   Physical Exam: Physical Exam Vitals and nursing note reviewed.  Constitutional:      Appearance: Normal appearance.  HENT:     Head: Normocephalic and atraumatic.     Mouth/Throat:     Pharynx: Oropharynx is clear.  Eyes:     Pupils: Pupils are equal, round, and reactive to light.  Cardiovascular:     Rate and Rhythm: Normal rate and regular rhythm.  Pulmonary:      Effort: Pulmonary effort is normal.     Breath sounds: Normal breath sounds.  Abdominal:     General: Abdomen is flat.     Palpations: Abdomen is soft.  Musculoskeletal:        General: Normal range of motion.  Skin:    General: Skin is warm and dry.  Neurological:     General: No focal deficit present.     Mental Status: He is alert. Mental status is at baseline.  Psychiatric:        Attention and Perception: He is inattentive.        Mood and Affect: Mood is depressed.        Speech: Speech is delayed.        Behavior: Behavior is slowed.        Thought Content: Thought content includes suicidal ideation. Thought content does not include suicidal plan.   Review of Systems  Constitutional: Negative.   HENT: Negative.    Eyes: Negative.   Respiratory: Negative.    Cardiovascular: Negative.   Gastrointestinal: Negative.   Musculoskeletal: Negative.   Skin: Negative.   Neurological: Negative.   Psychiatric/Behavioral:  Positive for depression and suicidal ideas.   Blood pressure 131/66, pulse 81, temperature 97.8 F (36.6 C), temperature source Oral, resp. rate (!) 26, height 5\' 5"  (1.651 m), weight 123.8 kg, SpO2 96 %. Body mass index is 45.41 kg/m.   Treatment Plan Summary: Medication management and Plan continue ECT.  Treatment #2 today.  No change to medication for now.  Alethia Berthold, MD 08/13/2021, 5:19 PM

## 2021-08-13 NOTE — Progress Notes (Signed)
D: Pt alert and oriented. Pt rates depression 8/10, hopelessness 10/10, and anxiety 9/10. Pt reports energy level as low. Pt reports sleep last night as being good. Pt did receive medications for sleep and did find them helpful. Pt reports experiencing 10/10 chronic pain at this time, pt is NPO at this time, this writer will reassess once back on the unit from ECT. Pt denies experiencing any SI/HI, or AVH at this time.   Pt tolerated ECT well today. Pt still expresses emotions of depression and anxiety. Pt has a sense of humor that he shares from time to time. Pt expresses being "woren out/tired" after ECT.    A: Scheduled medications administered to pt, per MD orders. Support and encouragement provided. Frequent verbal contact made. Routine safety checks conducted q15 minutes.   R: No adverse drug reactions noted. Pt verbally contracts for safety at this time. Pt complaint with medications. Pt interacts well with others on the unit. Pt remains safe at this time. Will continue to monitor.

## 2021-08-13 NOTE — Progress Notes (Signed)
Patient ID: Luis [name], male   DOB: Aug 16, 1970, 51 y.o.   MRN: [id number]  Pt visible in the unit watching TV and using the phone at times. He denied SI/HI/AVH or self harm thoughts/intent. Stated that he feels "a little better " compared to yesterday and that he thinks that ECT is helping. He talked about the # of ECT 's left and when he will be discharging. He denied any discomfort other than feeling tired. Reports good appetite, hydration and that he had a BM today. He continues to ambulate with a steady gait with no falls or unsafe behavior noted thus far. Pt encouraged to elevate BLE and he verbalized understanding. CBG completed with a reading of 291mg /dl. Q15 min safety checks maintained for safety and support provided as needed.

## 2021-08-14 ENCOUNTER — Other Ambulatory Visit: Payer: Self-pay | Admitting: Psychiatry

## 2021-08-14 DIAGNOSIS — F332 Major depressive disorder, recurrent severe without psychotic features: Secondary | ICD-10-CM | POA: Diagnosis not present

## 2021-08-14 LAB — GLUCOSE, CAPILLARY
Glucose-Capillary: 242 mg/dL — ABNORMAL HIGH (ref 70–99)
Glucose-Capillary: 291 mg/dL — ABNORMAL HIGH (ref 70–99)
Glucose-Capillary: 185 mg/dL — ABNORMAL HIGH (ref 70–99)
Glucose-Capillary: 243 mg/dL — ABNORMAL HIGH (ref 70–99)
Glucose-Capillary: 209 mg/dL — ABNORMAL HIGH (ref 70–99)

## 2021-08-14 NOTE — Progress Notes (Signed)
°   08/14/21 1250  Clinical Encounter Type  Visited With Patient  Visit Type Initial;Social support  Referral From Other (Comment) (rounding)  Spiritual Encounters  Spiritual Needs Other (Comment) (not yet assessed)   Chaplain Burris engaged Pt briefly in dayroom to make initial introductions. Chaplain joined Pt as he watched television. Left invitation open to have conversation later if desired.

## 2021-08-14 NOTE — Progress Notes (Signed)
Pt visible on the unit, interacting with peers and staff. He denies SI/HI and AVH. Pt states his goal is to get better.He states he is sad and depressed because his wife died last year of lupus. Pt is getting ECT and reports feeling better. RN will discuss the grief/loss process with the pt today.

## 2021-08-14 NOTE — Progress Notes (Signed)
Recreation Therapy Notes  Date: 08/14/2021  Time: 10:45am   Location: Court yard   Behavioral response: N/A   Intervention Topic: Strengths    Discussion/Intervention: Patient refused to attend group.   Clinical Observations/Feedback:  Patient refused to attend group.    Cynitha Berte LRT/CTRS          Ataya Murdy 08/14/2021 12:12 PM

## 2021-08-14 NOTE — Progress Notes (Signed)
[facility] MD Progress Note  08/14/2021 1:30 PM Luis [name]  MRN:  [id number] Subjective: Follow-up for this patient with severe depression.  Patient tells me today is a "good day".  Mood is not as bad as it was before.  Not having intrusive suicidal thoughts today.  Still having same physical symptoms pain in his ankle especially.  Blood pressure reading today was elevated but that has not been a consistent finding.  Patient has been calm been out of his room been attending groups. Principal Problem: MDD (major depressive disorder), recurrent severe, without psychosis ([place]) Diagnosis: Principal Problem:   MDD (major depressive disorder), recurrent severe, without psychosis ([place]) Active Problems:   Chronic systolic CHF (congestive heart failure) (HCC)   Pacemaker   Diabetes mellitus without complication (HCC)   Hypertension   GERD (gastroesophageal reflux disease)   Cocaine use disorder ([place])   Sleep apnea  Total Time spent with patient: 30 minutes  Past Psychiatric History: Past history of recurrent depression and substance abuse  Past Medical History:  Past Medical History:  Diagnosis Date   Anxiety    Biventricular ICD (implantable cardioverter-defibrillator) in place    Chronic systolic CHF (congestive heart failure) ([place])    Cocaine use    Diabetes mellitus without complication (HCC)    GERD (gastroesophageal reflux disease)    HLD (hyperlipidemia)    Hypertension    Morbid obesity (HCC)    NICM (nonischemic cardiomyopathy) (HCC)    OSA (obstructive sleep apnea)    PTSD (post-traumatic stress disorder)    on Depakote   Refusal of blood transfusions as patient is Jehovah's Witness    Tobacco abuse     Past Surgical History:  Procedure Laterality Date   BIV ICD INSERTION CRT-D     FOOT FRACTURE SURGERY     ROTATOR CUFF REPAIR     TONSILLECTOMY     Family History:  Family History  Problem Relation Age of Onset   Hypertension Mother    Bone cancer Father    Lung  cancer Father    Family Psychiatric  History: See previous Social History:  Social History   Substance and Sexual Activity  Alcohol Use Not Currently     Social History   Substance and Sexual Activity  Drug Use Yes   Types: Cocaine    Social History   Socioeconomic History   Marital status: Legally Separated    Spouse name: Not on file   Number of children: Not on file   Years of education: Not on file   Highest education level: Not on file  Occupational History   Not on file  Tobacco Use   Smoking status: Every Day    Types: Cigarettes   Smokeless tobacco: Never   Tobacco comments:    occasionally  Vaping Use   Vaping Use: Never used  Substance and Sexual Activity   Alcohol use: Not Currently   Drug use: Yes    Types: Cocaine   Sexual activity: Not on file  Other Topics Concern   Not on file  Social History Narrative   Not on file   Social Determinants of Health   Financial Resource Strain: Not on file  Food Insecurity: Not on file  Transportation Needs: Not on file  Physical Activity: Not on file  Stress: Not on file  Social Connections: Not on file   Additional Social History:  Sleep: Fair  Appetite:  Fair  Current Medications: Current Facility-Administered Medications  Medication Dose Route Frequency Provider Last Rate Last Admin   acetaminophen (TYLENOL) tablet 650 mg  650 mg Oral Q6H PRN Lucky Rathke, FNP   650 mg at 08/09/21 1735   alum & mag hydroxide-simeth (MAALOX/MYLANTA) 200-200-20 MG/5ML suspension 30 mL  30 mL Oral Q4H PRN Lucky Rathke, FNP       buPROPion (WELLBUTRIN XL) 24 hr tablet 300 mg  300 mg Oral Daily Serrena Linderman T, MD   300 mg at 08/14/21 0754   carvedilol (COREG) tablet 6.25 mg  6.25 mg Oral BID WC Rin Gorton, Madie Reno, MD   6.25 mg at 08/14/21 0757   clopidogrel (PLAVIX) tablet 75 mg  75 mg Oral Daily Azariel Banik, Madie Reno, MD   75 mg at 08/14/21 0756   DULoxetine (CYMBALTA) DR capsule 90 mg  90 mg  Oral Daily Hildagard Sobecki, Madie Reno, MD   90 mg at 08/14/21 0755   furosemide (LASIX) tablet 80 mg  80 mg Oral Daily Jax Abdelrahman T, MD   80 mg at 08/14/21 0756   glycopyrrolate (ROBINUL) injection 0.1 mg  0.1 mg Intravenous Once Johnavon Mcclafferty T, MD       glycopyrrolate (ROBINUL) injection 0.2 mg  0.2 mg Intravenous Once Ursala Cressy, Madie Reno, MD       hydrOXYzine (ATARAX) tablet 25 mg  25 mg Oral TID PRN Lucky Rathke, FNP   25 mg at 08/09/21 2139   ibuprofen (ADVIL) tablet 600 mg  600 mg Oral Q6H PRN Aymara Sassi T, MD       insulin aspart (novoLOG) injection 0-5 Units  0-5 Units Subcutaneous QHS Deontrey Massi T, MD   3 Units at 08/13/21 2123   insulin aspart (novoLOG) injection 0-9 Units  0-9 Units Subcutaneous TID WC Ava Deguire T, MD   3 Units at 08/14/21 1205   insulin aspart protamine- aspart (NOVOLOG MIX 70/30) injection 60 Units  60 Units Subcutaneous TID PC Preciosa Bundrick T, MD   60 Units at 08/14/21 1209   magnesium hydroxide (MILK OF MAGNESIA) suspension 30 mL  30 mL Oral Daily PRN Lucky Rathke, FNP       midazolam (VERSED) injection 2 mg  2 mg Intravenous Once Lahoma Constantin T, MD       ondansetron (ZOFRAN) injection 4 mg  4 mg Intravenous Once PRN Arita Miss, MD       pantoprazole (PROTONIX) EC tablet 40 mg  40 mg Oral Daily Kadijah Shamoon, Madie Reno, MD   40 mg at 08/14/21 0756   prazosin (MINIPRESS) capsule 2 mg  2 mg Oral QHS Ophia Shamoon T, MD   2 mg at 08/13/21 2124   rosuvastatin (CRESTOR) tablet 20 mg  20 mg Oral Daily Jaia Alonge, Madie Reno, MD   20 mg at 08/14/21 0755   traZODone (DESYREL) tablet 200 mg  200 mg Oral QHS Caden Fukushima T, MD   200 mg at 08/13/21 2125    Lab Results:  Results for orders placed or performed during the hospital encounter of 08/06/21 (from the past 48 hour(s))  Glucose, capillary     Status: Abnormal   Collection Time: 08/12/21  4:13 PM  Result Value Ref Range   Glucose-Capillary 159 (H) 70 - 99 mg/dL    Comment: Glucose reference range applies only to samples taken  after fasting for at least 8 hours.   Comment 1 Notify RN   Glucose, capillary  Status: Abnormal   Collection Time: 08/12/21  8:55 PM  Result Value Ref Range   Glucose-Capillary 225 (H) 70 - 99 mg/dL    Comment: Glucose reference range applies only to samples taken after fasting for at least 8 hours.   Comment 1 Notify RN   Glucose, capillary     Status: Abnormal   Collection Time: 08/13/21  6:53 AM  Result Value Ref Range   Glucose-Capillary 201 (H) 70 - 99 mg/dL    Comment: Glucose reference range applies only to samples taken after fasting for at least 8 hours.  Glucose, capillary     Status: Abnormal   Collection Time: 08/13/21  2:08 PM  Result Value Ref Range   Glucose-Capillary 177 (H) 70 - 99 mg/dL    Comment: Glucose reference range applies only to samples taken after fasting for at least 8 hours.  Glucose, capillary     Status: Abnormal   Collection Time: 08/13/21  2:59 PM  Result Value Ref Range   Glucose-Capillary 147 (H) 70 - 99 mg/dL    Comment: Glucose reference range applies only to samples taken after fasting for at least 8 hours.  Glucose, capillary     Status: Abnormal   Collection Time: 08/13/21  4:37 PM  Result Value Ref Range   Glucose-Capillary 242 (H) 70 - 99 mg/dL    Comment: Glucose reference range applies only to samples taken after fasting for at least 8 hours.  Glucose, capillary     Status: Abnormal   Collection Time: 08/13/21  8:50 PM  Result Value Ref Range   Glucose-Capillary 291 (H) 70 - 99 mg/dL    Comment: Glucose reference range applies only to samples taken after fasting for at least 8 hours.  Glucose, capillary     Status: Abnormal   Collection Time: 08/14/21  6:42 AM  Result Value Ref Range   Glucose-Capillary 185 (H) 70 - 99 mg/dL    Comment: Glucose reference range applies only to samples taken after fasting for at least 8 hours.  Glucose, capillary     Status: Abnormal   Collection Time: 08/14/21 11:50 AM  Result Value Ref Range    Glucose-Capillary 243 (H) 70 - 99 mg/dL    Comment: Glucose reference range applies only to samples taken after fasting for at least 8 hours.   Comment 1 Notify RN     Blood Alcohol level:  Lab Results  Component Value Date   ETH <10 08/05/2021   ETH <10 123456    Metabolic Disorder Labs: Lab Results  Component Value Date   HGBA1C 10.0 (H) 08/05/2021   MPG 240.3 08/05/2021   MPG 205.86 01/27/2021   No results found for: PROLACTIN Lab Results  Component Value Date   CHOL 143 08/05/2021   TRIG 245 (H) 08/05/2021   HDL 33 (L) 08/05/2021   CHOLHDL 4.3 08/05/2021   VLDL 49 (H) 08/05/2021   LDLCALC 61 08/05/2021   LDLCALC 71 01/27/2021    Physical Findings: AIMS:  , ,  ,  ,    CIWA:    COWS:     Musculoskeletal: Strength & Muscle Tone: within normal limits Gait & Station: normal Patient leans: N/A  Psychiatric Specialty Exam:  Presentation  General Appearance: Disheveled  Eye Contact:Minimal  Speech:Clear and Coherent; Normal Rate  Speech Volume:Normal  Handedness:Right   Mood and Affect  Mood:Irritable  Affect:Congruent; Depressed   Thought Process  Thought Processes:Coherent  Descriptions of Associations:Intact  Orientation:Full (Time, Place and Person)  Thought Content:Logical  History of Schizophrenia/Schizoaffective disorder:No  Duration of Psychotic Symptoms:No data recorded Hallucinations:No data recorded Ideas of Reference:None  Suicidal Thoughts:No data recorded Homicidal Thoughts:No data recorded  Sensorium  Memory:Immediate Good; Recent Good; Remote Good  Judgment:Poor  Insight:Poor   Executive Functions  Concentration:Good  Attention Span:Good  [place] of Knowledge:Good  Language:Good   Psychomotor Activity  Psychomotor Activity:No data recorded  Assets  Assets:Communication Skills; Desire for Improvement; Housing; Intimacy; Leisure Time; Physical Health; Resilience; Social Support   Sleep   Sleep:No data recorded   Physical Exam: Physical Exam Constitutional:      Appearance: Normal appearance.  HENT:     Head: Normocephalic and atraumatic.     Mouth/Throat:     Pharynx: Oropharynx is clear.  Eyes:     Pupils: Pupils are equal, round, and reactive to light.  Cardiovascular:     Rate and Rhythm: Normal rate and regular rhythm.  Pulmonary:     Effort: Pulmonary effort is normal.     Breath sounds: Normal breath sounds.  Abdominal:     General: Abdomen is flat.     Palpations: Abdomen is soft.  Musculoskeletal:        General: Normal range of motion.  Skin:    General: Skin is warm and dry.  Neurological:     General: No focal deficit present.     Mental Status: He is alert. Mental status is at baseline.  Psychiatric:        Attention and Perception: Attention normal.        Mood and Affect: Mood is depressed.        Speech: Speech is delayed.        Behavior: Behavior is slowed.        Thought Content: Thought content normal.   Review of Systems  Constitutional: Negative.   HENT: Negative.    Eyes: Negative.   Respiratory: Negative.    Cardiovascular: Negative.   Gastrointestinal: Negative.   Musculoskeletal: Negative.   Skin: Negative.   Neurological: Negative.   Psychiatric/Behavioral:  Positive for depression.   Blood pressure (!) 161/85, pulse 84, temperature 98.4 F (36.9 C), temperature source Oral, resp. rate 17, height 5\' 5"  (1.651 m), weight 123.8 kg, SpO2 97 %. Body mass index is 45.41 kg/m.   Treatment Plan Summary: Medication management and Plan next ECT treatment tomorrow.  No change to medicine for now.  Supportive and educational counseling.  I will not start any medicine for blood pressure yet because we have only had the rare elevated reading.  Alethia Berthold, MD 08/14/2021, 1:30 PM

## 2021-08-14 NOTE — Plan of Care (Signed)
°  Problem: Education: Goal: Knowledge of [place] [facility] Education information/materials will improve Outcome: Progressing Goal: Emotional status will improve Outcome: Progressing Goal: Mental status will improve Outcome: Progressing Goal: Verbalization of understanding the information provided will improve Outcome: Progressing   Problem: Activity: Goal: Interest or engagement in activities will improve Outcome: Progressing Goal: Sleeping patterns will improve Outcome: Progressing   Problem: Coping: Goal: Ability to verbalize frustrations and anger appropriately will improve Outcome: Progressing Goal: Ability to demonstrate self-control will improve Outcome: Progressing   Problem: Health Behavior/Discharge Planning: Goal: Identification of resources available to assist in meeting health care needs will improve Outcome: Progressing   Problem: Physical Regulation: Goal: Ability to maintain clinical measurements within normal limits will improve Outcome: Progressing   Problem: Safety: Goal: Periods of time without injury will increase Outcome: Progressing   Problem: Education: Goal: Utilization of techniques to improve thought processes will improve Outcome: Progressing Goal: Knowledge of the prescribed therapeutic regimen will improve Outcome: Progressing   Problem: Activity: Goal: Interest or engagement in leisure activities will improve Outcome: Progressing Goal: Imbalance in normal sleep/wake cycle will improve Outcome: Progressing   Problem: Coping: Goal: Coping ability will improve Outcome: Progressing Goal: Will verbalize feelings Outcome: Progressing   Problem: Health Behavior/Discharge Planning: Goal: Ability to make decisions will improve Outcome: Progressing Goal: Compliance with therapeutic regimen will improve Outcome: Progressing   Problem: Role Relationship: Goal: Will demonstrate positive changes in social behaviors and  relationships Outcome: Progressing

## 2021-08-14 NOTE — Group Note (Signed)
[facility] LCSW Group Therapy Note   Group Date: 08/14/2021 Start Time: 1300 End Time: 1400   Type of Therapy/Topic:  Group Therapy:  Balance in Life  Participation Level:  Active   Description of Group:    This group will address the concept of balance and how it feels and looks when one is unbalanced. Patients will be encouraged to process areas in their lives that are out of balance, and identify reasons for remaining unbalanced. Facilitators will guide patients utilizing problem- solving interventions to address and correct the stressor making their life unbalanced. Understanding and applying boundaries will be explored and addressed for obtaining  and maintaining a balanced life. Patients will be encouraged to explore ways to assertively make their unbalanced needs known to significant others in their lives, using other group members and facilitator for support and feedback.  Therapeutic Goals: Patient will identify two or more emotions or situations they have that consume much of in their lives. Patient will identify signs/triggers that life has become out of balance:  Patient will identify two ways to set boundaries in order to achieve balance in their lives:  Patient will demonstrate ability to communicate their needs through discussion and/or role plays  Summary of Patient Progress:    Patient was present in group. Patient was an active participant in group.  Patient was supportive of others.  He reports that when he thinks of balance he thinks "theres a lot of time in the day to get things done".  He reports that his balance is affected by "nasty attitudes, finances". He reports that he is also triggered by his neighbor.  He reports an incident where his neighbor approached him and stated that the pt was "sleeping with his wife".  Patient reports that he became triggered but was able to handle the situation appropriately.    Therapeutic Modalities:   Cognitive Behavioral  Therapy Solution-Focused Therapy Assertiveness Training   [name], LCSW

## 2021-08-15 ENCOUNTER — Encounter: Payer: Self-pay | Admitting: Family

## 2021-08-15 ENCOUNTER — Ambulatory Visit: Payer: Medicare Other | Attending: Psychiatry

## 2021-08-15 ENCOUNTER — Inpatient Hospital Stay: Payer: Medicare Other | Admitting: Certified Registered Nurse Anesthetist

## 2021-08-15 DIAGNOSIS — E162 Hypoglycemia, unspecified: Secondary | ICD-10-CM | POA: Diagnosis not present

## 2021-08-15 DIAGNOSIS — F332 Major depressive disorder, recurrent severe without psychotic features: Secondary | ICD-10-CM | POA: Diagnosis not present

## 2021-08-15 LAB — GLUCOSE, CAPILLARY
Glucose-Capillary: 212 mg/dL — ABNORMAL HIGH (ref 70–99)
Glucose-Capillary: 109 mg/dL — ABNORMAL HIGH (ref 70–99)
Glucose-Capillary: 152 mg/dL — ABNORMAL HIGH (ref 70–99)

## 2021-08-15 MED ORDER — SUCCINYLCHOLINE CHLORIDE 200 MG/10ML IV SOSY
PREFILLED_SYRINGE | INTRAVENOUS | Status: AC
  Filled 2021-08-15: qty 10

## 2021-08-15 MED ORDER — MIDAZOLAM HCL 2 MG/2ML IJ SOLN: 2.0000 mg | Freq: Once | INTRAMUSCULAR | Status: DC

## 2021-08-15 MED ORDER — LABETALOL HCL 5 MG/ML IV SOLN
INTRAVENOUS | Status: AC
Start: 2021-08-15 — End: ?
  Filled 2021-08-15: qty 4

## 2021-08-15 MED ORDER — SODIUM CHLORIDE 0.9 % IV SOLN
500.0000 mL | Freq: Once | INTRAVENOUS | Status: AC
  Administered 2021-08-15: 500 mL via INTRAVENOUS

## 2021-08-15 MED ORDER — MIDAZOLAM HCL 2 MG/2ML IJ SOLN
INTRAMUSCULAR | Status: AC
  Filled 2021-08-15: qty 2

## 2021-08-15 MED ORDER — SODIUM CHLORIDE 0.9 % IV SOLN: INTRAVENOUS | Status: DC | PRN

## 2021-08-15 MED ORDER — GLYCOPYRROLATE 0.2 MG/ML IJ SOLN
INTRAMUSCULAR | Status: AC
  Administered 2021-08-15: 0.1 mg via INTRAVENOUS
  Filled 2021-08-15: qty 1

## 2021-08-15 MED ORDER — MIDAZOLAM HCL 2 MG/2ML IJ SOLN
INTRAMUSCULAR | Status: DC | PRN
  Administered 2021-08-15: 2 mg via INTRAVENOUS

## 2021-08-15 MED ORDER — METHOHEXITAL SODIUM 100 MG/10ML IV SOSY
PREFILLED_SYRINGE | INTRAVENOUS | Status: DC | PRN
  Administered 2021-08-15: 120 mg via INTRAVENOUS

## 2021-08-15 MED ORDER — GLYCOPYRROLATE 0.2 MG/ML IJ SOLN: 0.1000 mg | Freq: Once | INTRAMUSCULAR | Status: AC

## 2021-08-15 MED ORDER — LABETALOL HCL 5 MG/ML IV SOLN
INTRAVENOUS | Status: DC | PRN
  Administered 2021-08-15: 10 mg via INTRAVENOUS

## 2021-08-15 MED ORDER — ONDANSETRON HCL 4 MG/2ML IJ SOLN: 4.0000 mg | Freq: Once | INTRAMUSCULAR | Status: DC | PRN

## 2021-08-15 MED ORDER — SUCCINYLCHOLINE CHLORIDE 200 MG/10ML IV SOSY
PREFILLED_SYRINGE | INTRAVENOUS | Status: DC | PRN
  Administered 2021-08-15: 150 mg via INTRAVENOUS

## 2021-08-15 NOTE — Anesthesia Procedure Notes (Signed)
Date/Time: 08/15/2021 1:22 PM Performed by: Stormy Fabian, CRNA Pre-anesthesia Checklist: Patient identified, Emergency Drugs available, Suction available and Patient being monitored Patient Re-evaluated:Patient Re-evaluated prior to induction Oxygen Delivery Method: Circle system utilized Preoxygenation: Pre-oxygenation with 100% oxygen Induction Type: IV induction Ventilation: Mask ventilation without difficulty and Mask ventilation throughout procedure Airway Equipment and Method: Bite block Placement Confirmation: positive ETCO2 Dental Injury: Teeth and Oropharynx as per pre-operative assessment

## 2021-08-15 NOTE — Group Note (Signed)
[facility] LCSW Group Therapy Note ° ° °Group Date: 08/15/2021 °Start Time: 1300 °End Time: 1400 ° °Type of Therapy and Topic:  Group Therapy:  Feelings around Relapse and Recovery ° °Participation Level:  Did Not Attend  ° °Mood: ° °Description of Group:   ° Patients in this group will discuss emotions they experience before and after a relapse. They will process how experiencing these feelings, or avoidance of experiencing them, relates to having a relapse. Facilitator will guide patients to explore emotions they have related to recovery. Patients will be encouraged to process which emotions are more powerful. They will be guided to discuss the emotional reaction significant others in their lives may have to patients’ relapse or recovery. Patients will be assisted in exploring ways to respond to the emotions of others without this contributing to a relapse. ° °Therapeutic Goals: °Patient will identify two or more emotions that lead to relapse for them:  °Patient will identify two emotions that result when they relapse:  °Patient will identify two emotions related to recovery:  °Patient will demonstrate ability to communicate their needs through discussion and/or role plays. ° ° °Summary of Patient Progress: ° °Patient did not attend group despite encouraged participation.  ° ° °Therapeutic Modalities:   °Cognitive Behavioral Therapy °Solution-Focused Therapy °Assertiveness Training °Relapse Prevention Therapy ° ° °Amando Ishikawa W Zeinab Rodwell, LCSWA °

## 2021-08-15 NOTE — Progress Notes (Signed)
Recreation Therapy Notes ° ° °Date: 08/15/2021 ° °Time: 10:40 am  ° °Location: Craft room   ° °Behavioral response: N/A °  °Intervention Topic: Communication  ° °Discussion/Intervention: °Patient refused to attend group.  ° °Clinical Observations/Feedback:  °Patient refused to attend group.  °  °Branndon Tuite LRT/CTRS ° ° ° ° ° ° ° °Saralee Bolick °08/15/2021 11:05 AM °

## 2021-08-15 NOTE — Anesthesia Preprocedure Evaluation (Signed)
Anesthesia Evaluation  Patient identified by MRN, date of birth, ID band Patient awake    Reviewed: Allergy & Precautions, NPO status , Patient's Chart, lab work & pertinent test results  History of Anesthesia Complications Negative for: history of anesthetic complications  Airway Mallampati: II  TM Distance: >3 FB Neck ROM: Full    Dental no notable dental hx. (+) Teeth Intact   Pulmonary sleep apnea , neg COPD, Current Smoker and Patient abstained from smoking.,    Pulmonary exam normal breath sounds clear to auscultation       Cardiovascular Exercise Tolerance: Good METShypertension, +CHF  (-) CAD and (-) Past MI (-) dysrhythmias + Cardiac Defibrillator  Rhythm:Regular Rate:Normal - Systolic murmurs 1. No LV thrombus is seen. There is marked septal-lateral wall left  ventricular dyssynchrony. Left ventricular ejection fraction, by  estimation, is 30 to 35%. The left ventricle has moderately decreased  function. The left ventricle demonstrates global  hypokinesis. Left ventricular diastolic parameters are consistent with  Grade II diastolic dysfunction (pseudonormalization). Elevated left atrial  pressure.  2. Right ventricular systolic function is normal. The right ventricular  size is normal. Tricuspid regurgitation signal is inadequate for assessing  PA pressure.  3. Left atrial size was moderately dilated.  4. The mitral valve is normal in structure. Mild mitral valve  regurgitation. No evidence of mitral stenosis.  5. The aortic valve is normal in structure. Aortic valve regurgitation is  not visualized. No aortic stenosis is present.  6. The inferior vena cava is dilated in size with <50% respiratory  variability, suggesting right atrial pressure of 15 mmHg.    Neuro/Psych PSYCHIATRIC DISORDERS Anxiety Depression negative neurological ROS     GI/Hepatic GERD  ,(+)     substance abuse  cocaine use, Patient  cocaine positive on admission 08/05/21. Has been in hospital since then. Patient denies cocaine use this month. It is more than 5 days out since last positive UDS on admission, patient has remained in hospital. Reasonable to proceed without repeat UDS   Endo/Other  diabetesMorbid obesity  Renal/GU negative Renal ROS     Musculoskeletal   Abdominal (+) + obese,   Peds  Hematology   Anesthesia Other Findings Past Medical History: No date: Anxiety No date: Biventricular ICD (implantable cardioverter-defibrillator)  in place No date: Chronic systolic CHF (congestive heart failure) (HCC) No date: Cocaine use No date: Diabetes mellitus without complication (HCC) No date: GERD (gastroesophageal reflux disease) No date: HLD (hyperlipidemia) No date: Hypertension No date: Morbid obesity (HCC) No date: NICM (nonischemic cardiomyopathy) ([place]) No date: OSA (obstructive sleep apnea) No date: PTSD (post-traumatic stress disorder)     Comment:  on Depakote No date: Refusal of blood transfusions as patient is Jehovah's Witness No date: Tobacco abuse  Reproductive/Obstetrics                             Anesthesia Physical  Anesthesia Plan  ASA: 4  Anesthesia Plan: General   Post-op Pain Management: Minimal or no pain anticipated   Induction: Intravenous  PONV Risk Score and Plan: 1 and Ondansetron and TIVA  Airway Management Planned: Mask  Additional Equipment: None  Intra-op Plan:   Post-operative Plan:   Informed Consent: I have reviewed the patients History and Physical, chart, labs and discussed the procedure including the risks, benefits and alternatives for the proposed anesthesia with the patient or authorized representative who has indicated his/her understanding and acceptance.  Dental advisory given  Plan Discussed with: CRNA and Surgeon  Anesthesia Plan Comments: (Discussed risks of anesthesia with patient, including PONV, muscle  aches. Rare risks discussed as well, such as cardiorespiratory sequelae, need for airway intervention and its associated risks including lip/dental/eye damage and sore throat, and allergic reactions. Discussed the role of CRNA in patient's perioperative care. Patient understands. Patient counseled on being higher risk for anesthesia due to comorbidities: morbid obesity, HFrEF. Patient was told about increased risk of cardiac and respiratory events, including death. )        Anesthesia Quick Evaluation

## 2021-08-15 NOTE — Progress Notes (Signed)
Pt returned from ECT, via wheelchair by transport. Pt awake/oriented x4 and able to move all extremities. CPAP provided, because he wants to sleep.No complaints of pain or nausea and vomiting. Food/carb modified or ordered. Gait steady

## 2021-08-15 NOTE — Plan of Care (Signed)
  Problem: Education: Goal: Knowledge of [place] [facility] Education information/materials will improve Outcome: Progressing Goal: Emotional status will improve Outcome: Progressing Goal: Mental status will improve Outcome: Progressing Goal: Verbalization of understanding the information provided will improve Outcome: Progressing   Problem: Activity: Goal: Interest or engagement in activities will improve Outcome: Progressing Goal: Sleeping patterns will improve Outcome: Progressing   Problem: Coping: Goal: Ability to verbalize frustrations and anger appropriately will improve Outcome: Progressing Goal: Ability to demonstrate self-control will improve Outcome: Progressing   

## 2021-08-15 NOTE — Anesthesia Postprocedure Evaluation (Signed)
Anesthesia Post Note  Patient: Luis [name]  Procedure(s) Performed: ECT TX  Patient location during evaluation: PACU Anesthesia Type: General Level of consciousness: awake and alert Pain management: pain level controlled Vital Signs Assessment: post-procedure vital signs reviewed and stable Respiratory status: spontaneous breathing, nonlabored ventilation, respiratory function stable and patient connected to nasal cannula oxygen Cardiovascular status: blood pressure returned to baseline and stable Postop Assessment: no apparent nausea or vomiting Anesthetic complications: no   No notable events documented.   Last Vitals:  Vitals:   08/15/21 1352 08/15/21 1400  BP: 134/86 117/72  Pulse: 85 84  Resp: 20 20  Temp: (!) 36.1 C   SpO2: 92% 96%    Last Pain:  Vitals:   08/15/21 1400  TempSrc:   PainSc: 0-No pain                 [name]

## 2021-08-15 NOTE — Progress Notes (Signed)
[facility] MD Progress Note  08/15/2021 6:34 PM Luis [name]  MRN:  [id number] Subjective: Follow-up for this 51 year old man with depression.  Patient is reporting that his mood is better.  Indeed he looks more cheerful and has been more talkative and with a brighter affect.  He denies having any further suicidal thoughts.  He says he feels like his stress level is diminished.  Physically still having some pain.  Has been talking with his family on the phone which is an improvement.  Tolerating ECT well. Principal Problem: MDD (major depressive disorder), recurrent severe, without psychosis ([place]) Diagnosis: Principal Problem:   MDD (major depressive disorder), recurrent severe, without psychosis ([place]) Active Problems:   Chronic systolic CHF (congestive heart failure) (HCC)   Pacemaker   Diabetes mellitus without complication (HCC)   Hypertension   GERD (gastroesophageal reflux disease)   Cocaine use disorder ([place])   Sleep apnea  Total Time spent with patient: 30 minutes  Past Psychiatric History: Past history of depression and substance abuse  Past Medical History:  Past Medical History:  Diagnosis Date   Anxiety    Biventricular ICD (implantable cardioverter-defibrillator) in place    Chronic systolic CHF (congestive heart failure) ([place])    Cocaine use    Diabetes mellitus without complication (HCC)    GERD (gastroesophageal reflux disease)    HLD (hyperlipidemia)    Hypertension    Morbid obesity (HCC)    NICM (nonischemic cardiomyopathy) (HCC)    OSA (obstructive sleep apnea)    PTSD (post-traumatic stress disorder)    on Depakote   Refusal of blood transfusions as patient is Jehovah's Witness    Tobacco abuse     Past Surgical History:  Procedure Laterality Date   BIV ICD INSERTION CRT-D     FOOT FRACTURE SURGERY     ROTATOR CUFF REPAIR     TONSILLECTOMY     Family History:  Family History  Problem Relation Age of Onset   Hypertension Mother    Bone cancer  Father    Lung cancer Father    Family Psychiatric  History: See previous Social History:  Social History   Substance and Sexual Activity  Alcohol Use Not Currently     Social History   Substance and Sexual Activity  Drug Use Yes   Types: Cocaine    Social History   Socioeconomic History   Marital status: Legally Separated    Spouse name: Not on file   Number of children: Not on file   Years of education: Not on file   Highest education level: Not on file  Occupational History   Not on file  Tobacco Use   Smoking status: Every Day    Types: Cigarettes   Smokeless tobacco: Never   Tobacco comments:    occasionally  Vaping Use   Vaping Use: Never used  Substance and Sexual Activity   Alcohol use: Not Currently   Drug use: Yes    Types: Cocaine   Sexual activity: Not on file  Other Topics Concern   Not on file  Social History Narrative   Not on file   Social Determinants of Health   Financial Resource Strain: Not on file  Food Insecurity: Not on file  Transportation Needs: Not on file  Physical Activity: Not on file  Stress: Not on file  Social Connections: Not on file   Additional Social History:  Sleep: Fair  Appetite:  Fair  Current Medications: Current Facility-Administered Medications  Medication Dose Route Frequency Provider Last Rate Last Admin   acetaminophen (TYLENOL) tablet 650 mg  650 mg Oral Q6H PRN Lucky Rathke, FNP   650 mg at 08/09/21 1735   alum & mag hydroxide-simeth (MAALOX/MYLANTA) 200-200-20 MG/5ML suspension 30 mL  30 mL Oral Q4H PRN Lucky Rathke, FNP       buPROPion (WELLBUTRIN XL) 24 hr tablet 300 mg  300 mg Oral Daily Kenyetta Wimbish T, MD   300 mg at 08/15/21 0803   carvedilol (COREG) tablet 6.25 mg  6.25 mg Oral BID WC Lakendra Helling, Madie Reno, MD   6.25 mg at 08/15/21 1621   clopidogrel (PLAVIX) tablet 75 mg  75 mg Oral Daily Ziana Heyliger, Madie Reno, MD   75 mg at 08/15/21 0805   DULoxetine (CYMBALTA) DR  capsule 90 mg  90 mg Oral Daily Rodolfo Notaro, Madie Reno, MD   90 mg at 08/15/21 0801   furosemide (LASIX) tablet 80 mg  80 mg Oral Daily Franz Svec, Madie Reno, MD   80 mg at 08/15/21 0806   glycopyrrolate (ROBINUL) injection 0.1 mg  0.1 mg Intravenous Once Gerldine Suleiman, Madie Reno, MD       glycopyrrolate (ROBINUL) injection 0.2 mg  0.2 mg Intravenous Once Caedyn Raygoza, Madie Reno, MD       hydrOXYzine (ATARAX) tablet 25 mg  25 mg Oral TID PRN Lucky Rathke, FNP   25 mg at 08/09/21 2139   ibuprofen (ADVIL) tablet 600 mg  600 mg Oral Q6H PRN Alarik Radu, Madie Reno, MD       insulin aspart (novoLOG) injection 0-5 Units  0-5 Units Subcutaneous QHS Vannak Montenegro T, MD   2 Units at 08/15/21 0059   insulin aspart (novoLOG) injection 0-9 Units  0-9 Units Subcutaneous TID WC Edwena Mayorga, Madie Reno, MD   3 Units at 08/15/21 1635   insulin aspart protamine- aspart (NOVOLOG MIX 70/30) injection 60 Units  60 Units Subcutaneous TID PC Kaida Games T, MD   60 Units at 08/15/21 1727   magnesium hydroxide (MILK OF MAGNESIA) suspension 30 mL  30 mL Oral Daily PRN Lucky Rathke, FNP       midazolam (VERSED) injection 2 mg  2 mg Intravenous Once Samarth Ogle T, MD       midazolam (VERSED) injection 2 mg  2 mg Intravenous Once Abem Shaddix, Madie Reno, MD       ondansetron [facility]) injection 4 mg  4 mg Intravenous Once PRN Arita Miss, MD       ondansetron [facility]) injection 4 mg  4 mg Intravenous Once PRN Arita Miss, MD       pantoprazole (PROTONIX) EC tablet 40 mg  40 mg Oral Daily Bambie Pizzolato, Madie Reno, MD   40 mg at 08/15/21 [id number]   prazosin (MINIPRESS) capsule 2 mg  2 mg Oral QHS Braxten Memmer T, MD   2 mg at 08/15/21 0057   rosuvastatin (CRESTOR) tablet 20 mg  20 mg Oral Daily Hady Niemczyk, Madie Reno, MD   20 mg at 08/15/21 [id number]   traZODone (DESYREL) tablet 200 mg  200 mg Oral QHS Emani Taussig, Madie Reno, MD   200 mg at 08/15/21 [id number]    Lab Results:  Results for orders placed or performed during the hospital encounter of 08/06/21 (from the past 48 hour(s))  Glucose, capillary      Status: Abnormal   Collection Time: 08/13/21  8:50 PM  Result Value Ref Range  Glucose-Capillary 291 (H) 70 - 99 mg/dL    Comment: Glucose reference range applies only to samples taken after fasting for at least 8 hours.  Glucose, capillary     Status: Abnormal   Collection Time: 08/14/21  6:42 AM  Result Value Ref Range   Glucose-Capillary 185 (H) 70 - 99 mg/dL    Comment: Glucose reference range applies only to samples taken after fasting for at least 8 hours.  Glucose, capillary     Status: Abnormal   Collection Time: 08/14/21 11:50 AM  Result Value Ref Range   Glucose-Capillary 243 (H) 70 - 99 mg/dL    Comment: Glucose reference range applies only to samples taken after fasting for at least 8 hours.   Comment 1 Notify RN   Glucose, capillary     Status: Abnormal   Collection Time: 08/14/21  4:08 PM  Result Value Ref Range   Glucose-Capillary 209 (H) 70 - 99 mg/dL    Comment: Glucose reference range applies only to samples taken after fasting for at least 8 hours.  Glucose, capillary     Status: Abnormal   Collection Time: 08/14/21  8:02 PM  Result Value Ref Range   Glucose-Capillary 212 (H) 70 - 99 mg/dL    Comment: Glucose reference range applies only to samples taken after fasting for at least 8 hours.   Comment 1 Notify RN   Glucose, capillary     Status: Abnormal   Collection Time: 08/15/21  6:30 AM  Result Value Ref Range   Glucose-Capillary 109 (H) 70 - 99 mg/dL    Comment: Glucose reference range applies only to samples taken after fasting for at least 8 hours.  Glucose, capillary     Status: Abnormal   Collection Time: 08/15/21  2:06 PM  Result Value Ref Range   Glucose-Capillary 152 (H) 70 - 99 mg/dL    Comment: Glucose reference range applies only to samples taken after fasting for at least 8 hours.    Blood Alcohol level:  Lab Results  Component Value Date   ETH <10 08/05/2021   ETH <10 [id number]    Metabolic Disorder Labs: Lab Results  Component Value  Date   HGBA1C 10.0 (H) 08/05/2021   MPG 240.3 08/05/2021   MPG 205.86 01/27/2021   No results found for: PROLACTIN Lab Results  Component Value Date   CHOL 143 08/05/2021   TRIG 245 (H) 08/05/2021   HDL 33 (L) 08/05/2021   CHOLHDL 4.3 08/05/2021   VLDL 49 (H) 08/05/2021   LDLCALC 61 08/05/2021   LDLCALC 71 01/27/2021    Physical Findings: AIMS:  , ,  ,  ,    CIWA:    COWS:     Musculoskeletal: Strength & Muscle Tone: within normal limits Gait & Station: normal Patient leans: N/A  Psychiatric Specialty Exam:  Presentation  General Appearance: Disheveled  Eye Contact:Minimal  Speech:Clear and Coherent; Normal Rate  Speech Volume:Normal  Handedness:Right   Mood and Affect  Mood:Irritable  Affect:Congruent; Depressed   Thought Process  Thought Processes:Coherent  Descriptions of Associations:Intact  Orientation:Full (Time, Place and Person)  Thought Content:Logical  History of Schizophrenia/Schizoaffective disorder:No  Duration of Psychotic Symptoms:No data recorded Hallucinations:No data recorded Ideas of Reference:None  Suicidal Thoughts:No data recorded Homicidal Thoughts:No data recorded  Sensorium  Memory:Immediate Good; Recent Good; Remote Good  Judgment:Poor  Insight:Poor   Executive Functions  Concentration:Good  Attention Span:Good  [place]  Language:Good   Psychomotor Activity  Psychomotor Activity:No  data recorded  Assets  Assets:Communication Skills; Desire for Improvement; Housing; Intimacy; Leisure Time; Physical Health; Resilience; Social Support   Sleep  Sleep:No data recorded   Physical Exam: Physical Exam Vitals and nursing note reviewed.  Constitutional:      Appearance: Normal appearance.  HENT:     Head: Normocephalic and atraumatic.     Mouth/Throat:     Pharynx: Oropharynx is clear.  Eyes:     Pupils: Pupils are equal, round, and reactive to light.  Cardiovascular:      Rate and Rhythm: Normal rate and regular rhythm.  Pulmonary:     Effort: Pulmonary effort is normal.     Breath sounds: Normal breath sounds.  Abdominal:     General: Abdomen is flat.     Palpations: Abdomen is soft.  Musculoskeletal:        General: Normal range of motion.  Skin:    General: Skin is warm and dry.  Neurological:     General: No focal deficit present.     Mental Status: He is alert. Mental status is at baseline.  Psychiatric:        Mood and Affect: Mood normal.        Thought Content: Thought content normal.   Review of Systems  Constitutional: Negative.   HENT: Negative.    Eyes: Negative.   Respiratory: Negative.    Cardiovascular: Negative.   Gastrointestinal: Negative.   Musculoskeletal: Negative.   Skin: Negative.   Neurological: Negative.   Psychiatric/Behavioral: Negative.    Blood pressure 124/79, pulse 75, temperature 98.5 F (36.9 C), temperature source Oral, resp. rate 20, height 5\' 5"  (1.651 m), weight 123.8 kg, SpO2 98 %. Body mass index is 45.41 kg/m.   Treatment Plan Summary: Medication management and Plan no change to current medication.  He had ECT today and once again tolerated it well although the seizure was a little on the short side.  Seems to be improving nevertheless.  Patient will continue with ECT into next week at which point we will assess when he has plateaued.  Alethia Berthold, MD 08/15/2021, 6:34 PM

## 2021-08-15 NOTE — Procedures (Signed)
ECT SERVICES Physicians Interval Evaluation & Treatment Note  Patient Identification: Luis [name] MRN:  [id number] Date of Evaluation:  08/15/2021 TX #: [id number]  MADRS:   MMSE:   P.E. Findings:  Physical exam about the same although he is emotionally brighter.  Psychiatric Interval Note:  Mood is feeling better.  He describes himself as feeling less stressed  Subjective:  Patient is a 51 y.o. male seen for evaluation for Electroconvulsive Therapy. Feeling better less suicidal thought  Treatment Summary:   [x]   Right Unilateral             []  Bilateral   % Energy : 0.3 ms 100%   Impedance: 1320 ohms  Seizure Energy Index: 15,194 V squared  Postictal Suppression Index: 49%  Seizure Concordance Index: 25%  Medications  Pre Shock: Robinul 0.1 mg Brevital 120 mg succinylcholine 150 mg  Post Shock: Versed 2 mg  Seizure Duration: 6 seconds EMG 18 seconds by my reading EEG   Comments: Short and not very good quality seizure nevertheless he seems to be improving.  We will continue into next week switching to ketamine for anesthetic  Lungs:  [x]   Clear to auscultation               []  Other:   Heart:    [x]   Regular rhythm             []  irregular rhythm    [x]   Previous H&P reviewed, patient examined and there are NO CHANGES                 []   Previous H&P reviewed, patient examined and there are changes noted.   Alethia Berthold, MD 2/17/20236:37 PM

## 2021-08-15 NOTE — Transfer of Care (Signed)
Immediate Anesthesia Transfer of Care Note  Patient: Luis [name]  Procedure(s) Performed: * No procedures listed *  Patient Location: PACU  Anesthesia Type:General  Level of Consciousness: sedated  Airway & Oxygen Therapy: Patient Spontanous Breathing and Patient connected to face mask oxygen  Post-op Assessment: Report given to RN and Post -op Vital signs reviewed and stable  Post vital signs: Reviewed and stable  Last Vitals:  Vitals:   08/15/21 0624 08/15/21 1102  BP: 127/70 130/76  Pulse: 87 78  Resp: 17 17  Temp: 36.6 C 36.6 C  SpO2: [id number]    Complications: No apparent anesthesia complications

## 2021-08-15 NOTE — Progress Notes (Signed)
Pt NPO for ECT. Has been in bed sleeping. He got his am medications minus his insulin. Pt denies SI.HI and AVH. He reports the ECT treatments are working and he is less depressed. He denies any anxiety issues. No complaints voiced. CPAP intact while asleep and no signs of resp distress.

## 2021-08-15 NOTE — Progress Notes (Signed)
Patient has been active on the unit. Deeply engrossed in a TV show when I approached him.  He reports starting to feel better and believes the ECT is working for him. He denies si  hi  avh and pain at this encounter.  He does continue to endorse depression but states that his depression is getting  better.  Blood sugar is 214 this evening and he will received 2 units of insulin per protocol.  He is in the dayroom watching TV and wants to wait until he is ready for bed to take his medications. Will continue to monitor with 15 min safety checks and encouraged him to seek staff with any concerns.      Rosalie Doctor, LPN

## 2021-08-15 NOTE — H&P (Signed)
Luis [name] is an 51 y.o. male.   Chief Complaint: depressed HPI: see previous  Past Medical History:  Diagnosis Date   Anxiety    Biventricular ICD (implantable cardioverter-defibrillator) in place    Chronic systolic CHF (congestive heart failure) (HCC)    Cocaine use    Diabetes mellitus without complication (HCC)    GERD (gastroesophageal reflux disease)    HLD (hyperlipidemia)    Hypertension    Morbid obesity (HCC)    NICM (nonischemic cardiomyopathy) (HCC)    OSA (obstructive sleep apnea)    PTSD (post-traumatic stress disorder)    on Depakote   Refusal of blood transfusions as patient is Jehovah's Witness    Tobacco abuse     Past Surgical History:  Procedure Laterality Date   BIV ICD INSERTION CRT-D     FOOT FRACTURE SURGERY     ROTATOR CUFF REPAIR     TONSILLECTOMY      Family History  Problem Relation Age of Onset   Hypertension Mother    Bone cancer Father    Lung cancer Father    Social History:  reports that he has been smoking cigarettes. He has never used smokeless tobacco. He reports that he does not currently use alcohol. He reports current drug use. Drug: Cocaine.  Allergies:  Allergies  Allergen Reactions   Aspirin Anaphylaxis and Other (See Comments)    Throat closing    Iodine-131 Hives   Iodinated Contrast Media Hives   Latex Hives and Rash   Penicillins Nausea And Vomiting   Tramadol Hives and Nausea Only   Amoxicillin-Pot Clavulanate Diarrhea   Lidocaine Rash   Lisinopril Cough    Medications Prior to Admission  Medication Sig Dispense Refill   Artificial Tear Solution (TEARS NATURALE OP) Place 2 drops into both eyes in the morning and at bedtime.     buPROPion (WELLBUTRIN XL) 150 MG 24 hr tablet Take 1 tablet (150 mg total) by mouth every morning. 30 tablet 1   carvedilol (COREG) 6.25 MG tablet Take 6.25 mg by mouth 2 (two) times daily.     clopidogrel (PLAVIX) 75 MG tablet Take 75 mg by mouth daily.      clotrimazole-betamethasone (LOTRISONE) cream Apply 1 application topically in the morning and at bedtime. Applied to affected area.     divalproex (DEPAKOTE) 500 MG DR tablet Take 1 tablet (500 mg total) by mouth 2 (two) times daily. 60 tablet 1   DULoxetine (CYMBALTA) 30 MG capsule Take 90 mg by mouth daily.     furosemide (LASIX) 80 MG tablet Take 80 mg by mouth daily.     HUMALOG MIX 75/25 KWIKPEN (75-25) 100 UNIT/ML Kwikpen Inject 20 Units into the skin 2 (two) times daily before a meal. 15 mL 11   MELATONIN GUMMIES PO Take 3 tablets by mouth at bedtime.     Multiple Vitamin (MULTIVITAMIN WITH MINERALS) TABS tablet Take 1 tablet by mouth daily.     oxyCODONE-acetaminophen (PERCOCET) 7.5-325 MG tablet Take 0.5-1 tablets by mouth every 12 (twelve) hours as needed for severe pain.     pantoprazole (PROTONIX) 40 MG tablet Take 40 mg by mouth daily.     prazosin (MINIPRESS) 2 MG capsule Take 1 capsule (2 mg total) by mouth at bedtime. 30 capsule 1   ramelteon (ROZEREM) 8 MG tablet Take 1 tablet (8 mg total) by mouth at bedtime. 30 tablet 1   rosuvastatin (CRESTOR) 20 MG tablet Take 20 mg by mouth every evening.  sertraline (ZOLOFT) 100 MG tablet Take 2 tablets (200 mg total) by mouth daily. 60 tablet 1   traZODone (DESYREL) 100 MG tablet Take 200 mg by mouth at bedtime.     TRULICITY 1.5 MG/0.5ML SOPN Inject 1.5 mg into the skin once a week.     VICTOZA 18 MG/3ML SOPN Inject 1.8 mg into the skin daily. 9 mL 0    Results for orders placed or performed during the hospital encounter of 08/06/21 (from the past 48 hour(s))  Glucose, capillary     Status: Abnormal   Collection Time: 08/13/21  2:08 PM  Result Value Ref Range   Glucose-Capillary 177 (H) 70 - 99 mg/dL    Comment: Glucose reference range applies only to samples taken after fasting for at least 8 hours.  Glucose, capillary     Status: Abnormal   Collection Time: 08/13/21  2:59 PM  Result Value Ref Range   Glucose-Capillary 147 (H)  70 - 99 mg/dL    Comment: Glucose reference range applies only to samples taken after fasting for at least 8 hours.  Glucose, capillary     Status: Abnormal   Collection Time: 08/13/21  4:37 PM  Result Value Ref Range   Glucose-Capillary 242 (H) 70 - 99 mg/dL    Comment: Glucose reference range applies only to samples taken after fasting for at least 8 hours.  Glucose, capillary     Status: Abnormal   Collection Time: 08/13/21  8:50 PM  Result Value Ref Range   Glucose-Capillary 291 (H) 70 - 99 mg/dL    Comment: Glucose reference range applies only to samples taken after fasting for at least 8 hours.  Glucose, capillary     Status: Abnormal   Collection Time: 08/14/21  6:42 AM  Result Value Ref Range   Glucose-Capillary 185 (H) 70 - 99 mg/dL    Comment: Glucose reference range applies only to samples taken after fasting for at least 8 hours.  Glucose, capillary     Status: Abnormal   Collection Time: 08/14/21 11:50 AM  Result Value Ref Range   Glucose-Capillary 243 (H) 70 - 99 mg/dL    Comment: Glucose reference range applies only to samples taken after fasting for at least 8 hours.   Comment 1 Notify RN   Glucose, capillary     Status: Abnormal   Collection Time: 08/14/21  4:08 PM  Result Value Ref Range   Glucose-Capillary 209 (H) 70 - 99 mg/dL    Comment: Glucose reference range applies only to samples taken after fasting for at least 8 hours.  Glucose, capillary     Status: Abnormal   Collection Time: 08/14/21  8:02 PM  Result Value Ref Range   Glucose-Capillary 212 (H) 70 - 99 mg/dL    Comment: Glucose reference range applies only to samples taken after fasting for at least 8 hours.   Comment 1 Notify RN   Glucose, capillary     Status: Abnormal   Collection Time: 08/15/21  6:30 AM  Result Value Ref Range   Glucose-Capillary 109 (H) 70 - 99 mg/dL    Comment: Glucose reference range applies only to samples taken after fasting for at least 8 hours.   No results  found.  Review of Systems  Constitutional: Negative.   HENT: Negative.    Eyes: Negative.   Respiratory: Negative.    Cardiovascular: Negative.   Gastrointestinal: Negative.   Musculoskeletal: Negative.   Skin: Negative.   Neurological: Negative.   Psychiatric/Behavioral:  Positive for dysphoric mood.    Blood pressure 130/76, pulse 78, temperature 97.8 F (36.6 C), temperature source Tympanic, resp. rate 17, height 5\' 5"  (1.651 m), weight 123.8 kg, SpO2 97 %. Physical Exam Vitals and nursing note reviewed.  Constitutional:      Appearance: He is well-developed.  HENT:     Head: Normocephalic and atraumatic.  Eyes:     Conjunctiva/sclera: Conjunctivae normal.     Pupils: Pupils are equal, round, and reactive to light.  Cardiovascular:     Heart sounds: Normal heart sounds.  Pulmonary:     Effort: Pulmonary effort is normal.  Abdominal:     Palpations: Abdomen is soft.  Musculoskeletal:        General: Normal range of motion.     Cervical back: Normal range of motion.  Skin:    General: Skin is warm and dry.  Neurological:     General: No focal deficit present.     Mental Status: He is alert.  Psychiatric:        Attention and Perception: Attention normal.        Mood and Affect: Mood is depressed.        Speech: Speech normal.        Behavior: Behavior normal.        Thought Content: Thought content normal.        Cognition and Memory: Cognition normal.     Assessment/Plan Conitue index treatment  Mordecai Rasmussen, MD 08/15/2021, 12:17 PM

## 2021-08-16 DIAGNOSIS — F332 Major depressive disorder, recurrent severe without psychotic features: Secondary | ICD-10-CM | POA: Diagnosis not present

## 2021-08-16 NOTE — Progress Notes (Signed)
Patient alert and oriented x 4, he complained of headache earlier in the shift was medicated per PRN, his thoughts are organized and coherent, makes eye contact, he denies SI/HI/AVH  interacting appropriately with staff, he appears less anxious, he was complaint with scheduled medication but refused glucose check he stated " the previous nurse did it and she got on my nerves"  15 minutes safety checks maintained will continue to monitor closely.

## 2021-08-16 NOTE — Progress Notes (Signed)
Pt has been alert and oriented to person, place, time and situation. Pt has been pleasant, watching tv in the dayroom, is medication complaint. Pt is quiet, makes fair eye contact, appears disheveled. Pt denies SI/HI/AVH, denies feelings of depression and anxiety. Pt's appetite is good. Will continue to monitor pt per Q15 minute face checks and monitor for safety and progress.

## 2021-08-16 NOTE — Progress Notes (Signed)
[facility] MD Progress Note  08/16/2021 4:47 PM Luis [name]  MRN:  [id number] Subjective: Follow-up for this 51 year old man with depression.  Pt continues to report better mood, no SI.  Luis [name] wants to continue ECT treatment.   Denied craving for drugs.   Principal Problem: MDD (major depressive disorder), recurrent severe, without psychosis ([place]) Diagnosis: Principal Problem:   MDD (major depressive disorder), recurrent severe, without psychosis ([place]) Active Problems:   Chronic systolic CHF (congestive heart failure) (HCC)   Pacemaker   Diabetes mellitus without complication (HCC)   Hypertension   GERD (gastroesophageal reflux disease)   Cocaine use disorder (HCC)   Sleep apnea  Total Time spent with patient: 20 minutes  Past Psychiatric History: Past history of depression and substance abuse  Past Medical History:  Past Medical History:  Diagnosis Date   Anxiety    Biventricular ICD (implantable cardioverter-defibrillator) in place    Chronic systolic CHF (congestive heart failure) ([place])    Cocaine use    Diabetes mellitus without complication (HCC)    GERD (gastroesophageal reflux disease)    HLD (hyperlipidemia)    Hypertension    Morbid obesity (HCC)    NICM (nonischemic cardiomyopathy) (HCC)    OSA (obstructive sleep apnea)    PTSD (post-traumatic stress disorder)    on Depakote   Refusal of blood transfusions as patient is Jehovah's Witness    Tobacco abuse     Past Surgical History:  Procedure Laterality Date   BIV ICD INSERTION CRT-D     FOOT FRACTURE SURGERY     ROTATOR CUFF REPAIR     TONSILLECTOMY     Family History:  Family History  Problem Relation Age of Onset   Hypertension Mother    Bone cancer Father    Lung cancer Father    Family Psychiatric  History: See previous Social History:  Social History   Substance and Sexual Activity  Alcohol Use Not Currently     Social History   Substance and Sexual Activity  Drug Use Yes   Types:  Cocaine    Social History   Socioeconomic History   Marital status: Legally Separated    Spouse name: Not on file   Number of children: Not on file   Years of education: Not on file   Highest education level: Not on file  Occupational History   Not on file  Tobacco Use   Smoking status: Every Day    Types: Cigarettes   Smokeless tobacco: Never   Tobacco comments:    occasionally  Vaping Use   Vaping Use: Never used  Substance and Sexual Activity   Alcohol use: Not Currently   Drug use: Yes    Types: Cocaine   Sexual activity: Not on file  Other Topics Concern   Not on file  Social History Narrative   Not on file   Social Determinants of Health   Financial Resource Strain: Not on file  Food Insecurity: Not on file  Transportation Needs: Not on file  Physical Activity: Not on file  Stress: Not on file  Social Connections: Not on file   Additional Social History:    Sleep: Fair  Appetite:  Fair  Current Medications: Current Facility-Administered Medications  Medication Dose Route Frequency Provider Last Rate Last Admin   acetaminophen (TYLENOL) tablet 650 mg  650 mg Oral Q6H PRN Lucky Rathke, FNP   650 mg at 08/16/21 0844   alum & mag hydroxide-simeth (MAALOX/MYLANTA) 200-200-20 MG/5ML suspension 30 mL  30 mL Oral Q4H PRN Lucky Rathke, FNP       buPROPion (WELLBUTRIN XL) 24 hr tablet 300 mg  300 mg Oral Daily Clapacs, John T, MD   300 mg at 08/16/21 0834   carvedilol (COREG) tablet 6.25 mg  6.25 mg Oral BID WC Clapacs, Madie Reno, MD   6.25 mg at 08/16/21 [id number]   clopidogrel (PLAVIX) tablet 75 mg  75 mg Oral Daily Clapacs, Madie Reno, MD   75 mg at 08/16/21 0834   DULoxetine (CYMBALTA) DR capsule 90 mg  90 mg Oral Daily Clapacs, Madie Reno, MD   90 mg at 08/16/21 [id number]   furosemide (LASIX) tablet 80 mg  80 mg Oral Daily Clapacs, Madie Reno, MD   80 mg at 08/16/21 0835   glycopyrrolate (ROBINUL) injection 0.1 mg  0.1 mg Intravenous Once Clapacs, Madie Reno, MD       glycopyrrolate  (ROBINUL) injection 0.2 mg  0.2 mg Intravenous Once Clapacs, Madie Reno, MD       hydrOXYzine (ATARAX) tablet 25 mg  25 mg Oral TID PRN Lucky Rathke, FNP   25 mg at 08/16/21 0844   ibuprofen (ADVIL) tablet 600 mg  600 mg Oral Q6H PRN Clapacs, Madie Reno, MD       insulin aspart (novoLOG) injection 0-5 Units  0-5 Units Subcutaneous QHS Clapacs, John T, MD   2 Units at 08/15/21 0059   insulin aspart (novoLOG) injection 0-9 Units  0-9 Units Subcutaneous TID WC Clapacs, John T, MD   1 Units at 08/16/21 1219   insulin aspart protamine- aspart (NOVOLOG MIX 70/30) injection 60 Units  60 Units Subcutaneous TID PC Clapacs, John T, MD   60 Units at 08/16/21 1642   magnesium hydroxide (MILK OF MAGNESIA) suspension 30 mL  30 mL Oral Daily PRN Lucky Rathke, FNP       midazolam (VERSED) injection 2 mg  2 mg Intravenous Once Clapacs, John T, MD       midazolam (VERSED) injection 2 mg  2 mg Intravenous Once Clapacs, Madie Reno, MD       ondansetron [facility]) injection 4 mg  4 mg Intravenous Once PRN Arita Miss, MD       ondansetron [facility]) injection 4 mg  4 mg Intravenous Once PRN Arita Miss, MD       pantoprazole (PROTONIX) EC tablet 40 mg  40 mg Oral Daily Clapacs, Madie Reno, MD   40 mg at 08/16/21 [id number]   prazosin (MINIPRESS) capsule 2 mg  2 mg Oral QHS Clapacs, John T, MD   2 mg at 08/15/21 2115   rosuvastatin (CRESTOR) tablet 20 mg  20 mg Oral Daily Clapacs, Madie Reno, MD   20 mg at 08/16/21 [id number]   traZODone (DESYREL) tablet 200 mg  200 mg Oral QHS Clapacs, John T, MD   200 mg at 08/15/21 2115    Lab Results:  Results for orders placed or performed during the hospital encounter of 08/06/21 (from the past 48 hour(s))  Glucose, capillary     Status: Abnormal   Collection Time: 08/14/21  8:02 PM  Result Value Ref Range   Glucose-Capillary 212 (H) 70 - 99 mg/dL    Comment: Glucose reference range applies only to samples taken after fasting for at least 8 hours.   Comment 1 Notify RN   Glucose, capillary     Status:  Abnormal   Collection Time: 08/15/21  6:30 AM  Result Value Ref Range   Glucose-Capillary  109 (H) 70 - 99 mg/dL    Comment: Glucose reference range applies only to samples taken after fasting for at least 8 hours.  Glucose, capillary     Status: Abnormal   Collection Time: 08/15/21  2:06 PM  Result Value Ref Range   Glucose-Capillary 152 (H) 70 - 99 mg/dL    Comment: Glucose reference range applies only to samples taken after fasting for at least 8 hours.    Blood Alcohol level:  Lab Results  Component Value Date   ETH <10 08/05/2021   ETH <10 123456    Metabolic Disorder Labs: Lab Results  Component Value Date   HGBA1C 10.0 (H) 08/05/2021   MPG 240.3 08/05/2021   MPG 205.86 01/27/2021   No results found for: PROLACTIN Lab Results  Component Value Date   CHOL 143 08/05/2021   TRIG 245 (H) 08/05/2021   HDL 33 (L) 08/05/2021   CHOLHDL 4.3 08/05/2021   VLDL 49 (H) 08/05/2021   LDLCALC 61 08/05/2021   LDLCALC 71 01/27/2021    Physical Findings: AIMS:  , ,  ,  ,    CIWA:    COWS:     Musculoskeletal: Strength & Muscle Tone: within normal limits Gait & Station: normal Patient leans: N/A  Psychiatric Specialty Exam:  Presentation  General Appearance: Disheveled  Eye Contact:Minimal  Speech:Clear and Coherent; Normal Rate  Speech Volume:Normal  Handedness:Right   Mood and Affect  Mood:Irritable  Affect:Congruent; Depressed   Thought Process  Thought Processes:Coherent  Descriptions of Associations:Intact  Orientation:Full (Time, Place and Person)  Thought Content:Logical  History of Schizophrenia/Schizoaffective disorder:No  Duration of Psychotic Symptoms:No data recorded Hallucinations:No data recorded Ideas of Reference:None  Suicidal Thoughts:No data recorded Homicidal Thoughts:No data recorded  Sensorium  Memory:Immediate Good; Recent Good; Remote Good  Judgment:Poor  Insight:Poor   Executive Functions   Concentration:Good  Attention Span:Good  [place] of Knowledge:Good  Language:Good   Psychomotor Activity  Psychomotor Activity:No data recorded  Assets  Assets:Communication Skills; Desire for Improvement; Housing; Intimacy; Leisure Time; Physical Health; Resilience; Social Support   Sleep  Sleep:No data recorded   Physical Exam: Physical Exam Vitals and nursing note reviewed.  Constitutional:      Appearance: Normal appearance.  HENT:     Head: Normocephalic and atraumatic.     Mouth/Throat:     Pharynx: Oropharynx is clear.  Eyes:     Pupils: Pupils are equal, round, and reactive to light.  Cardiovascular:     Rate and Rhythm: Normal rate and regular rhythm.  Pulmonary:     Effort: Pulmonary effort is normal.     Breath sounds: Normal breath sounds.  Abdominal:     General: Abdomen is flat.     Palpations: Abdomen is soft.  Musculoskeletal:        General: Normal range of motion.  Skin:    General: Skin is warm and dry.  Neurological:     General: No focal deficit present.     Mental Status: Karinne Schmader is alert. Mental status is at baseline.  Psychiatric:        Mood and Affect: Mood normal.        Thought Content: Thought content normal.   Review of Systems  Constitutional: Negative.   HENT: Negative.    Eyes: Negative.   Respiratory: Negative.    Cardiovascular: Negative.   Gastrointestinal: Negative.   Musculoskeletal: Negative.   Skin: Negative.   Neurological: Negative.   Psychiatric/Behavioral: Negative.    Blood pressure  124/73, pulse 78, temperature 97.7 F (36.5 C), temperature source Oral, resp. rate 17, height 5\' 5"  (1.651 m), weight 123.8 kg, SpO2 95 %. Body mass index is 45.41 kg/m.   Treatment Plan Summary: Medication management and Plan no change to current medication.  Mercedes Fort had ECT today and once again tolerated it well although the seizure was a little on the short side.  Seems to be improving nevertheless.  Patient will  continue with ECT into next week at which point we will assess when Lety Cullens has plateaued.  MDD, severe, r/o cocaine induced mood disorder -- continue ECT -- conitnue wellbutrin XL 300mg  daily.  -- continue Cymbalta 90mg  daily.   Cocaine use disorder, r/o SIMD.  -- recommend substance abuse treatment.    Breeze Berringer, MD 08/16/2021, 4:47 PM

## 2021-08-16 NOTE — Group Note (Signed)
LCSW Group Therapy Note  Group Date: 08/16/2021 Start Time: [id number] End Time: 1415   Type of Therapy and Topic:  Group Therapy - Healthy vs Unhealthy Coping Skills  Participation Level:  Did Not Attend   Description of Group The focus of this group was to determine what unhealthy coping techniques typically are used by group members and what healthy coping techniques would be helpful in coping with various problems. Patients were guided in becoming aware of the differences between healthy and unhealthy coping techniques. Patients were asked to identify 2-3 healthy coping skills they would like to learn to use more effectively.  Therapeutic Goals Patients learned that coping is what human beings do all day long to deal with various situations in their lives Patients defined and discussed healthy vs unhealthy coping techniques Patients identified their preferred coping techniques and identified whether these were healthy or unhealthy Patients determined 2-3 healthy coping skills they would like to become more familiar with and use more often. Patients provided support and ideas to each other   Summary of Patient Progress:  Patient did not attend group despite encouraged participation.    Therapeutic Modalities Cognitive Behavioral Therapy Motivational Interviewing  Berniece Salines, Latanya Presser 08/16/2021  5:02 PM

## 2021-08-17 ENCOUNTER — Other Ambulatory Visit: Payer: Self-pay | Admitting: Psychiatry

## 2021-08-17 DIAGNOSIS — F332 Major depressive disorder, recurrent severe without psychotic features: Secondary | ICD-10-CM | POA: Diagnosis not present

## 2021-08-17 NOTE — Progress Notes (Signed)
[facility] MD Progress Note  08/17/2021 12:18 PM Luis [name]  MRN:  [id number] Subjective: Follow-up for this 51 year old man with depression.  [name] sleeps mostly during the day, and did not want to engage in the interview, but stated that things are going "ok".  [name] agreed to continue ECT.   RN confirmed that [name] did not sleep last night at all.   Principal Problem: MDD (major depressive disorder), recurrent severe, without psychosis ([place]) Diagnosis: Principal Problem:   MDD (major depressive disorder), recurrent severe, without psychosis ([place]) Active Problems:   Chronic systolic CHF (congestive heart failure) (HCC)   Pacemaker   Diabetes mellitus without complication (HCC)   Hypertension   GERD (gastroesophageal reflux disease)   Cocaine use disorder ([place])   Sleep apnea  Total Time spent with patient: 20 minutes  Past Psychiatric History: Past history of depression and substance abuse  Past Medical History:  Past Medical History:  Diagnosis Date   Anxiety    Biventricular ICD (implantable cardioverter-defibrillator) in place    Chronic systolic CHF (congestive heart failure) ([place])    Cocaine use    Diabetes mellitus without complication (HCC)    GERD (gastroesophageal reflux disease)    HLD (hyperlipidemia)    Hypertension    Morbid obesity (HCC)    NICM (nonischemic cardiomyopathy) (HCC)    OSA (obstructive sleep apnea)    PTSD (post-traumatic stress disorder)    on Depakote   Refusal of blood transfusions as patient is Jehovah's Witness    Tobacco abuse     Past Surgical History:  Procedure Laterality Date   BIV ICD INSERTION CRT-D     FOOT FRACTURE SURGERY     ROTATOR CUFF REPAIR     TONSILLECTOMY     Family History:  Family History  Problem Relation Age of Onset   Hypertension Mother    Bone cancer Father    Lung cancer Father    Family Psychiatric  History: See previous Social History:  Social History   Substance and Sexual Activity  Alcohol Use Not  Currently     Social History   Substance and Sexual Activity  Drug Use Yes   Types: Cocaine    Social History   Socioeconomic History   Marital status: Legally Separated    Spouse name: Not on file   Number of children: Not on file   Years of education: Not on file   Highest education level: Not on file  Occupational History   Not on file  Tobacco Use   Smoking status: Every Day    Types: Cigarettes   Smokeless tobacco: Never   Tobacco comments:    occasionally  Vaping Use   Vaping Use: Never used  Substance and Sexual Activity   Alcohol use: Not Currently   Drug use: Yes    Types: Cocaine   Sexual activity: Not on file  Other Topics Concern   Not on file  Social History Narrative   Not on file   Social Determinants of Health   Financial Resource Strain: Not on file  Food Insecurity: Not on file  Transportation Needs: Not on file  Physical Activity: Not on file  Stress: Not on file  Social Connections: Not on file   Additional Social History:    Sleep: Fair  Appetite:  Fair  Current Medications: Current Facility-Administered Medications  Medication Dose Route Frequency Provider Last Rate Last Admin   acetaminophen (TYLENOL) tablet 650 mg  650 mg Oral Q6H PRN Lucky Rathke,  FNP   650 mg at 08/16/21 0844   alum & mag hydroxide-simeth (MAALOX/MYLANTA) 200-200-20 MG/5ML suspension 30 mL  30 mL Oral Q4H PRN Lucky Rathke, FNP       buPROPion (WELLBUTRIN XL) 24 hr tablet 300 mg  300 mg Oral Daily Clapacs, John T, MD   300 mg at 08/17/21 0838   carvedilol (COREG) tablet 6.25 mg  6.25 mg Oral BID WC Clapacs, Madie Reno, MD   6.25 mg at 08/17/21 [id number]   clopidogrel (PLAVIX) tablet 75 mg  75 mg Oral Daily Clapacs, Madie Reno, MD   75 mg at 08/17/21 [id number]   DULoxetine (CYMBALTA) DR capsule 90 mg  90 mg Oral Daily Clapacs, Madie Reno, MD   90 mg at 08/17/21 [id number]   furosemide (LASIX) tablet 80 mg  80 mg Oral Daily Clapacs, John T, MD   80 mg at 08/17/21 [id number]   glycopyrrolate  (ROBINUL) injection 0.1 mg  0.1 mg Intravenous Once Clapacs, John T, MD       glycopyrrolate (ROBINUL) injection 0.2 mg  0.2 mg Intravenous Once Clapacs, Madie Reno, MD       hydrOXYzine (ATARAX) tablet 25 mg  25 mg Oral TID PRN Lucky Rathke, FNP   25 mg at 08/17/21 0223   ibuprofen (ADVIL) tablet 600 mg  600 mg Oral Q6H PRN Clapacs, John T, MD       insulin aspart (novoLOG) injection 0-5 Units  0-5 Units Subcutaneous QHS Clapacs, John T, MD   2 Units at 08/15/21 0059   insulin aspart (novoLOG) injection 0-9 Units  0-9 Units Subcutaneous TID WC Clapacs, John T, MD   1 Units at 08/16/21 1219   insulin aspart protamine- aspart (NOVOLOG MIX 70/30) injection 60 Units  60 Units Subcutaneous TID PC Clapacs, John T, MD   60 Units at 08/17/21 0839   magnesium hydroxide (MILK OF MAGNESIA) suspension 30 mL  30 mL Oral Daily PRN Lucky Rathke, FNP       midazolam (VERSED) injection 2 mg  2 mg Intravenous Once Clapacs, John T, MD       midazolam (VERSED) injection 2 mg  2 mg Intravenous Once Clapacs, Madie Reno, MD       ondansetron [facility]) injection 4 mg  4 mg Intravenous Once PRN Arita Miss, MD       ondansetron [facility]) injection 4 mg  4 mg Intravenous Once PRN Arita Miss, MD       pantoprazole (PROTONIX) EC tablet 40 mg  40 mg Oral Daily Clapacs, Madie Reno, MD   40 mg at 08/17/21 [id number]   prazosin (MINIPRESS) capsule 2 mg  2 mg Oral QHS Clapacs, John T, MD   2 mg at 08/16/21 2200   rosuvastatin (CRESTOR) tablet 20 mg  20 mg Oral Daily Clapacs, Madie Reno, MD   20 mg at 08/17/21 [id number]   traZODone (DESYREL) tablet 200 mg  200 mg Oral QHS Clapacs, John T, MD   200 mg at 08/16/21 2200    Lab Results:  Results for orders placed or performed during the hospital encounter of 08/06/21 (from the past 48 hour(s))  Glucose, capillary     Status: Abnormal   Collection Time: 08/15/21  2:06 PM  Result Value Ref Range   Glucose-Capillary 152 (H) 70 - 99 mg/dL    Comment: Glucose reference range applies only to samples taken  after fasting for at least 8 hours.    Blood Alcohol level:  Lab Results  Component  Value Date   ETH <10 08/05/2021   ETH <10 123456    Metabolic Disorder Labs: Lab Results  Component Value Date   HGBA1C 10.0 (H) 08/05/2021   MPG 240.3 08/05/2021   MPG 205.86 01/27/2021   No results found for: PROLACTIN Lab Results  Component Value Date   CHOL 143 08/05/2021   TRIG 245 (H) 08/05/2021   HDL 33 (L) 08/05/2021   CHOLHDL 4.3 08/05/2021   VLDL 49 (H) 08/05/2021   LDLCALC 61 08/05/2021   LDLCALC 71 01/27/2021    Physical Findings: AIMS:  , ,  ,  ,    CIWA:    COWS:     Musculoskeletal: Strength & Muscle Tone: within normal limits Gait & Station: normal Patient leans: N/A  Psychiatric Specialty Exam:  Presentation  General Appearance: Disheveled  Eye Contact:Minimal  Speech:Clear and Coherent; Normal Rate  Speech Volume:Normal  Handedness:Right   Mood and Affect  Mood:Irritable  Affect:Congruent; Depressed   Thought Process  Thought Processes:Coherent  Descriptions of Associations:Intact  Orientation:Full (Time, Place and Person)  Thought Content:Logical  History of Schizophrenia/Schizoaffective disorder:No  Duration of Psychotic Symptoms:No data recorded Hallucinations:No data recorded Ideas of Reference:None  Suicidal Thoughts:No data recorded Homicidal Thoughts:No data recorded  Sensorium  Memory:Immediate Good; Recent Good; Remote Good  Judgment:Poor  Insight:Poor   Executive Functions  Concentration:Good  Attention Span:Good  [place] of Knowledge:Good  Language:Good   Psychomotor Activity  Psychomotor Activity:No data recorded  Assets  Assets:Communication Skills; Desire for Improvement; Housing; Intimacy; Leisure Time; Physical Health; Resilience; Social Support   Sleep  Sleep:No data recorded   Physical Exam: Physical Exam Vitals and nursing note reviewed.  Constitutional:      Appearance:  Normal appearance.  HENT:     Head: Normocephalic and atraumatic.     Mouth/Throat:     Pharynx: Oropharynx is clear.  Eyes:     Pupils: Pupils are equal, round, and reactive to light.  Cardiovascular:     Rate and Rhythm: Normal rate and regular rhythm.  Pulmonary:     Effort: Pulmonary effort is normal.     Breath sounds: Normal breath sounds.  Abdominal:     General: Abdomen is flat.     Palpations: Abdomen is soft.  Musculoskeletal:        General: Normal range of motion.  Skin:    General: Skin is warm and dry.  Neurological:     General: No focal deficit present.     Mental Status: Kadajah Kjos is alert. Mental status is at baseline.  Psychiatric:        Mood and Affect: Mood normal.        Thought Content: Thought content normal.   Review of Systems  Constitutional: Negative.   HENT: Negative.    Eyes: Negative.   Respiratory: Negative.    Cardiovascular: Negative.   Gastrointestinal: Negative.   Musculoskeletal: Negative.   Skin: Negative.   Neurological: Negative.   Psychiatric/Behavioral: Negative.    Blood pressure 125/65, pulse 83, temperature 97.6 F (36.4 C), temperature source Oral, resp. rate 18, height 5\' 5"  (1.651 m), weight 123.8 kg, SpO2 96 %. Body mass index is 45.41 kg/m.   Treatment Plan Summary: Medication management and Plan no change to current medication.  Ameli Sangiovanni had ECT today and once again tolerated it well although the seizure was a little on the short side.  Seems to be improving nevertheless.  Patient will continue with ECT into next week at which point we will  assess when Verda Mehta has plateaued.  MDD, severe, r/o cocaine induced mood disorder -- continue ECT -- conitnue wellbutrin XL 300mg  daily.  -- continue Cymbalta 90mg  daily.   Cocaine use disorder, r/o SIMD.  -- recommend substance abuse treatment.    Ajah Vanhoose, MD 08/17/2021, 12:18 PM

## 2021-08-17 NOTE — Plan of Care (Signed)
D- Patient alert and oriented. Patient presented in a pleasant mood on assessment stating that he would sleep "ok, if they stop waking me up". Patient had complaints of chronic back pain, rating it a "10/10", stating that the only thing that helps is Percocet, which he was taking prior to admission. Patient stated that Tylenol doesn't help and he doesn't understand why he's prescribed Ibuprofen because he is "allergic to Aspirin". Patient denied anxiety, but stated that he has "a little bit of depression", however, he could not tell this writer what is making him feel this way. Patient also denied SI, HI, AVH at this time. Patient's goal for today is to "get out of my head", in which he stated that sleeping will help him do this. 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   A- Scheduled medications administered to patient, per MD orders. Support and encouragement provided.  Routine safety checks conducted every 15 minutes.  Patient informed to notify staff with problems or concerns.  R- No adverse drug reactions noted. Patient contracts for safety at this time. Patient compliant with medications and treatment plan. Patient receptive, calm, and cooperative. Patient interacts well with others on the unit.  Patient remains safe at this time.  Problem: Education: Goal: Knowledge of [facility] Education information/materials will improve Outcome: Progressing Goal: Emotional status will improve Outcome: Progressing Goal: Mental status will improve Outcome: Progressing Goal: Verbalization of understanding the information provided will improve Outcome: Progressing   Problem: Activity: Goal: Interest or engagement in activities will improve Outcome: Progressing Goal: Sleeping patterns will improve Outcome: Progressing   Problem: Coping: Goal: Ability to verbalize frustrations  and anger appropriately will improve Outcome: Progressing Goal: Ability to demonstrate self-control will improve Outcome: Progressing   Problem: Health Behavior/Discharge Planning: Goal: Identification of resources available to assist in meeting health care needs will improve Outcome: Progressing Goal: Compliance with treatment plan for underlying cause of condition will improve Outcome: Progressing   Problem: Physical Regulation: Goal: Ability to maintain clinical measurements within normal limits will improve Outcome: Progressing   Problem: Safety: Goal: Periods of time without injury will increase Outcome: Progressing   Problem: Education: Goal: Utilization of techniques to improve thought processes will improve Outcome: Progressing Goal: Knowledge of the prescribed therapeutic regimen will improve Outcome: Progressing   Problem: Activity: Goal: Interest or engagement in leisure activities will improve Outcome: Progressing Goal: Imbalance in normal sleep/wake cycle will improve Outcome: Progressing   Problem: Coping: Goal: Coping ability will improve Outcome: Progressing Goal: Will verbalize feelings Outcome: Progressing   Problem: Health Behavior/Discharge Planning: Goal: Ability to make decisions will improve Outcome: Progressing Goal: Compliance with therapeutic regimen will improve Outcome: Progressing   Problem: Role Relationship: Goal: Will demonstrate positive changes in social behaviors and relationships Outcome: Progressing   Problem: Safety: Goal: Ability to disclose and discuss suicidal ideas will improve Outcome: Progressing Goal: Ability to identify and utilize support systems that promote safety will improve Outcome: Progressing   Problem: Self-Concept: Goal: Will verbalize positive feelings about self Outcome: Progressing Goal: Level of anxiety will decrease Outcome: Progressing

## 2021-08-17 NOTE — Progress Notes (Signed)
Patient received PRN Vistaril and Tylenol for anxiety and back pain. When this writer asked patient what was making him anxious, he stated "I don't know, I just want to run like [place]". Patient tolerated medication administration well and remains safe on the unit.

## 2021-08-18 ENCOUNTER — Encounter: Payer: Self-pay | Admitting: Family

## 2021-08-18 ENCOUNTER — Ambulatory Visit: Payer: Medicare Other | Attending: Psychiatry

## 2021-08-18 ENCOUNTER — Inpatient Hospital Stay: Payer: Medicare Other | Admitting: Certified Registered Nurse Anesthetist

## 2021-08-18 DIAGNOSIS — G4730 Sleep apnea, unspecified: Secondary | ICD-10-CM | POA: Insufficient documentation

## 2021-08-18 DIAGNOSIS — I110 Hypertensive heart disease with heart failure: Secondary | ICD-10-CM | POA: Insufficient documentation

## 2021-08-18 DIAGNOSIS — K219 Gastro-esophageal reflux disease without esophagitis: Secondary | ICD-10-CM | POA: Diagnosis not present

## 2021-08-18 DIAGNOSIS — I5022 Chronic systolic (congestive) heart failure: Secondary | ICD-10-CM | POA: Diagnosis not present

## 2021-08-18 DIAGNOSIS — F332 Major depressive disorder, recurrent severe without psychotic features: Secondary | ICD-10-CM | POA: Insufficient documentation

## 2021-08-18 DIAGNOSIS — Z950 Presence of cardiac pacemaker: Secondary | ICD-10-CM | POA: Diagnosis not present

## 2021-08-18 DIAGNOSIS — E119 Type 2 diabetes mellitus without complications: Secondary | ICD-10-CM | POA: Diagnosis not present

## 2021-08-18 DIAGNOSIS — F1490 Cocaine use, unspecified, uncomplicated: Secondary | ICD-10-CM | POA: Insufficient documentation

## 2021-08-18 LAB — GLUCOSE, CAPILLARY
Glucose-Capillary: 222 mg/dL — ABNORMAL HIGH (ref 70–99)
Glucose-Capillary: 133 mg/dL — ABNORMAL HIGH (ref 70–99)
Glucose-Capillary: 142 mg/dL — ABNORMAL HIGH (ref 70–99)
Glucose-Capillary: 99 mg/dL (ref 70–99)
Glucose-Capillary: 128 mg/dL — ABNORMAL HIGH (ref 70–99)
Glucose-Capillary: 104 mg/dL — ABNORMAL HIGH (ref 70–99)
Glucose-Capillary: 118 mg/dL — ABNORMAL HIGH (ref 70–99)
Glucose-Capillary: 118 mg/dL — ABNORMAL HIGH (ref 70–99)
Glucose-Capillary: 92 mg/dL (ref 70–99)
Glucose-Capillary: 87 mg/dL (ref 70–99)

## 2021-08-18 MED ORDER — KETAMINE HCL 50 MG/5ML IJ SOSY
PREFILLED_SYRINGE | INTRAMUSCULAR | Status: AC
  Filled 2021-08-18: qty 5

## 2021-08-18 MED ORDER — GLYCOPYRROLATE 0.2 MG/ML IJ SOLN
0.1000 mg | Freq: Once | INTRAMUSCULAR | Status: DC
Start: 2021-08-18 — End: 2021-08-22

## 2021-08-18 MED ORDER — KETAMINE HCL 10 MG/ML IJ SOLN
INTRAMUSCULAR | Status: DC | PRN
Start: 2021-08-18 — End: 2021-08-18
  Administered 2021-08-18: 120 mg via INTRAVENOUS

## 2021-08-18 MED ORDER — MIDAZOLAM HCL 2 MG/2ML IJ SOLN
2.0000 mg | Freq: Once | INTRAMUSCULAR | Status: AC
  Administered 2021-08-18: 2 mg via INTRAVENOUS

## 2021-08-18 MED ORDER — SUCCINYLCHOLINE CHLORIDE 200 MG/10ML IV SOSY
PREFILLED_SYRINGE | INTRAVENOUS | Status: DC | PRN
  Administered 2021-08-18: 150 mg via INTRAVENOUS

## 2021-08-18 MED ORDER — LABETALOL HCL 5 MG/ML IV SOLN
INTRAVENOUS | Status: DC | PRN
  Administered 2021-08-18: 10 mg via INTRAVENOUS

## 2021-08-18 MED ORDER — MIDAZOLAM HCL 2 MG/2ML IJ SOLN
INTRAMUSCULAR | Status: AC
  Filled 2021-08-18: qty 2

## 2021-08-18 MED ORDER — SODIUM CHLORIDE 0.9 % IV SOLN: 500.0000 mL | Freq: Once | INTRAVENOUS | Status: AC

## 2021-08-18 NOTE — Transfer of Care (Signed)
Immediate Anesthesia Transfer of Care Note  Patient: [name]  Procedure(s) Performed: ECT TX  Patient Location: PACU  Anesthesia Type:General  Level of Consciousness: awake and alert   Airway & Oxygen Therapy: Patient Spontanous Breathing and Patient connected to face mask oxygen  Post-op Assessment: Report given to RN and Post -op Vital signs reviewed and stable  Post vital signs: Reviewed and stable  Last Vitals:  Vitals Value Taken Time  BP 167/102 08/18/21 1426  Temp    Pulse 84 08/18/21 1428  Resp 20 08/18/21 1428  SpO2 96 % 08/18/21 1428  Vitals shown include unvalidated device data.  Last Pain:  Vitals:   08/18/21 1340  TempSrc:   PainSc: 0-No pain      Patients Stated Pain Goal: 0 (08/13/21 0850)  Complications: No notable events documented.

## 2021-08-18 NOTE — Progress Notes (Signed)
Patient ID: Luis [name], male   DOB: 1971-04-01, 51 y.o.   MRN: [id number]  Pt presents pleasant, reports improved mood and is looking forward to getting discharged this Friday. He denied SI/HI/AVH or self harm thoughts/intent. Reports good sleep, appetite and elimination. He remains med compliant, ate his HS snacks and voiced no concerns. No falls or unsafe behavior noted thus far. Q15 min observations maintained for safety and support provided as needed.

## 2021-08-18 NOTE — H&P (Signed)
Luis [name] is an 51 y.o. male.   Chief Complaint: Patient reports his mood is feeling better HPI: History of severe recent depression  Past Medical History:  Diagnosis Date   Anxiety    Biventricular ICD (implantable cardioverter-defibrillator) in place    Chronic systolic CHF (congestive heart failure) (HCC)    Cocaine use    Diabetes mellitus without complication (HCC)    GERD (gastroesophageal reflux disease)    HLD (hyperlipidemia)    Hypertension    Morbid obesity (HCC)    NICM (nonischemic cardiomyopathy) (HCC)    OSA (obstructive sleep apnea)    PTSD (post-traumatic stress disorder)    on Depakote   Refusal of blood transfusions as patient is Jehovah's Witness    Tobacco abuse     Past Surgical History:  Procedure Laterality Date   BIV ICD INSERTION CRT-D     FOOT FRACTURE SURGERY     ROTATOR CUFF REPAIR     TONSILLECTOMY      Family History  Problem Relation Age of Onset   Hypertension Mother    Bone cancer Father    Lung cancer Father    Social History:  reports that he has been smoking cigarettes. He has never used smokeless tobacco. He reports that he does not currently use alcohol. He reports current drug use. Drug: Cocaine.  Allergies:  Allergies  Allergen Reactions   Aspirin Anaphylaxis and Other (See Comments)    Throat closing    Iodine-131 Hives   Iodinated Contrast Media Hives   Latex Hives and Rash   Penicillins Nausea And Vomiting   Tramadol Hives and Nausea Only   Amoxicillin-Pot Clavulanate Diarrhea   Lidocaine Rash   Lisinopril Cough    Medications Prior to Admission  Medication Sig Dispense Refill   Artificial Tear Solution (TEARS NATURALE OP) Place 2 drops into both eyes in the morning and at bedtime.     buPROPion (WELLBUTRIN XL) 150 MG 24 hr tablet Take 1 tablet (150 mg total) by mouth every morning. 30 tablet 1   carvedilol (COREG) 6.25 MG tablet Take 6.25 mg by mouth 2 (two) times daily.     clopidogrel (PLAVIX) 75 MG  tablet Take 75 mg by mouth daily.     clotrimazole-betamethasone (LOTRISONE) cream Apply 1 application topically in the morning and at bedtime. Applied to affected area.     divalproex (DEPAKOTE) 500 MG DR tablet Take 1 tablet (500 mg total) by mouth 2 (two) times daily. 60 tablet 1   DULoxetine (CYMBALTA) 30 MG capsule Take 90 mg by mouth daily.     furosemide (LASIX) 80 MG tablet Take 80 mg by mouth daily.     HUMALOG MIX 75/25 KWIKPEN (75-25) 100 UNIT/ML Kwikpen Inject 20 Units into the skin 2 (two) times daily before a meal. 15 mL 11   MELATONIN GUMMIES PO Take 3 tablets by mouth at bedtime.     Multiple Vitamin (MULTIVITAMIN WITH MINERALS) TABS tablet Take 1 tablet by mouth daily.     oxyCODONE-acetaminophen (PERCOCET) 7.5-325 MG tablet Take 0.5-1 tablets by mouth every 12 (twelve) hours as needed for severe pain.     pantoprazole (PROTONIX) 40 MG tablet Take 40 mg by mouth daily.     prazosin (MINIPRESS) 2 MG capsule Take 1 capsule (2 mg total) by mouth at bedtime. 30 capsule 1   ramelteon (ROZEREM) 8 MG tablet Take 1 tablet (8 mg total) by mouth at bedtime. 30 tablet 1   rosuvastatin (CRESTOR) 20 MG  tablet Take 20 mg by mouth every evening.     sertraline (ZOLOFT) 100 MG tablet Take 2 tablets (200 mg total) by mouth daily. 60 tablet 1   traZODone (DESYREL) 100 MG tablet Take 200 mg by mouth at bedtime.     TRULICITY 1.5 MG/0.5ML SOPN Inject 1.5 mg into the skin once a week.     VICTOZA 18 MG/3ML SOPN Inject 1.8 mg into the skin daily. 9 mL 0    Results for orders placed or performed during the hospital encounter of 08/06/21 (from the past 48 hour(s))  Glucose, capillary     Status: Abnormal   Collection Time: 08/16/21  8:26 PM  Result Value Ref Range   Glucose-Capillary 128 (H) 70 - 99 mg/dL    Comment: Glucose reference range applies only to samples taken after fasting for at least 8 hours.   Comment 1 Notify RN   Glucose, capillary     Status: Abnormal   Collection Time: 08/17/21   7:51 AM  Result Value Ref Range   Glucose-Capillary 104 (H) 70 - 99 mg/dL    Comment: Glucose reference range applies only to samples taken after fasting for at least 8 hours.  Glucose, capillary     Status: Abnormal   Collection Time: 08/17/21 11:51 AM  Result Value Ref Range   Glucose-Capillary 118 (H) 70 - 99 mg/dL    Comment: Glucose reference range applies only to samples taken after fasting for at least 8 hours.  Glucose, capillary     Status: Abnormal   Collection Time: 08/17/21  4:20 PM  Result Value Ref Range   Glucose-Capillary 118 (H) 70 - 99 mg/dL    Comment: Glucose reference range applies only to samples taken after fasting for at least 8 hours.  Glucose, capillary     Status: None   Collection Time: 08/17/21  9:17 PM  Result Value Ref Range   Glucose-Capillary 92 70 - 99 mg/dL    Comment: Glucose reference range applies only to samples taken after fasting for at least 8 hours.  Glucose, capillary     Status: None   Collection Time: 08/18/21  6:03 AM  Result Value Ref Range   Glucose-Capillary 87 70 - 99 mg/dL    Comment: Glucose reference range applies only to samples taken after fasting for at least 8 hours.   No results found.  Review of Systems  Constitutional: Negative.   HENT: Negative.    Eyes: Negative.   Respiratory: Negative.    Cardiovascular: Negative.   Gastrointestinal: Negative.   Musculoskeletal: Negative.   Skin: Negative.   Neurological: Negative.   Psychiatric/Behavioral: Negative.     Blood pressure (!) 120/99, pulse 85, temperature (!) 97.2 F (36.2 C), resp. rate (!) 22, height 5\' 5"  (1.651 m), weight 123.8 kg, SpO2 93 %. Physical Exam Vitals reviewed.  Constitutional:      Appearance: He is well-developed.  HENT:     Head: Normocephalic and atraumatic.  Eyes:     Conjunctiva/sclera: Conjunctivae normal.     Pupils: Pupils are equal, round, and reactive to light.  Cardiovascular:     Heart sounds: Normal heart sounds.  Pulmonary:      Effort: Pulmonary effort is normal.  Abdominal:     Palpations: Abdomen is soft.  Musculoskeletal:        General: Normal range of motion.     Cervical back: Normal range of motion.  Skin:    General: Skin is warm and dry.  Neurological:     General: No focal deficit present.     Mental Status: He is alert.  Psychiatric:        Mood and Affect: Mood normal.     Assessment/Plan Tolerating ECT.  Next treatment today and then reassess need for further treatment  Mordecai Rasmussen, MD 08/18/2021, 4:51 PM

## 2021-08-18 NOTE — Anesthesia Preprocedure Evaluation (Signed)
Anesthesia Evaluation  Patient identified by MRN, date of birth, ID band Patient awake    Reviewed: Allergy & Precautions, NPO status , Patient's Chart, lab work & pertinent test results  History of Anesthesia Complications Negative for: history of anesthetic complications  Airway Mallampati: II  TM Distance: >3 FB Neck ROM: Full    Dental no notable dental hx. (+) Teeth Intact   Pulmonary sleep apnea , neg COPD, Current Smoker and Patient abstained from smoking.,    Pulmonary exam normal breath sounds clear to auscultation       Cardiovascular Exercise Tolerance: Good METShypertension, +CHF  (-) CAD and (-) Past MI (-) dysrhythmias + Cardiac Defibrillator  Rhythm:Regular Rate:Normal - Systolic murmurs 1. No LV thrombus is seen. There is marked septal-lateral wall left  ventricular dyssynchrony. Left ventricular ejection fraction, by  estimation, is 30 to 35%. The left ventricle has moderately decreased  function. The left ventricle demonstrates global  hypokinesis. Left ventricular diastolic parameters are consistent with  Grade II diastolic dysfunction (pseudonormalization). Elevated left atrial  pressure.  2. Right ventricular systolic function is normal. The right ventricular  size is normal. Tricuspid regurgitation signal is inadequate for assessing  PA pressure.  3. Left atrial size was moderately dilated.  4. The mitral valve is normal in structure. Mild mitral valve  regurgitation. No evidence of mitral stenosis.  5. The aortic valve is normal in structure. Aortic valve regurgitation is  not visualized. No aortic stenosis is present.  6. The inferior vena cava is dilated in size with <50% respiratory  variability, suggesting right atrial pressure of 15 mmHg.    Neuro/Psych PSYCHIATRIC DISORDERS Anxiety Depression negative neurological ROS     GI/Hepatic GERD  ,(+)     substance abuse  cocaine use, Patient  cocaine positive on admission 08/05/21. Has been in hospital since then. Patient denies cocaine use this month. It is more than 5 days out since last positive UDS on admission, patient has remained in hospital. Reasonable to proceed without repeat UDS   Endo/Other  diabetesMorbid obesity  Renal/GU negative Renal ROS     Musculoskeletal   Abdominal (+) + obese,   Peds  Hematology   Anesthesia Other Findings Past Medical History: No date: Anxiety No date: Biventricular ICD (implantable cardioverter-defibrillator)  in place No date: Chronic systolic CHF (congestive heart failure) (HCC) No date: Cocaine use No date: Diabetes mellitus without complication (HCC) No date: GERD (gastroesophageal reflux disease) No date: HLD (hyperlipidemia) No date: Hypertension No date: Morbid obesity (HCC) No date: NICM (nonischemic cardiomyopathy) ([facility]) No date: OSA (obstructive sleep apnea) No date: PTSD (post-traumatic stress disorder)     Comment:  on Depakote No date: Refusal of blood transfusions as patient is Jehovah's Witness No date: Tobacco abuse  Reproductive/Obstetrics                             Anesthesia Physical  Anesthesia Plan  ASA: 4  Anesthesia Plan: General   Post-op Pain Management: Minimal or no pain anticipated   Induction: Intravenous  PONV Risk Score and Plan: 1 and Ondansetron and TIVA  Airway Management Planned: Mask  Additional Equipment: None  Intra-op Plan:   Post-operative Plan:   Informed Consent: I have reviewed the patients History and Physical, chart, labs and discussed the procedure including the risks, benefits and alternatives for the proposed anesthesia with the patient or authorized representative who has indicated his/her understanding and acceptance.  Dental advisory given  Plan Discussed with: CRNA and Surgeon  Anesthesia Plan Comments: (Discussed risks of anesthesia with patient, including PONV, muscle  aches. Rare risks discussed as well, such as cardiorespiratory sequelae, need for airway intervention and its associated risks including lip/dental/eye damage and sore throat, and allergic reactions. Discussed the role of CRNA in patient's perioperative care. Patient understands. Patient counseled on being higher risk for anesthesia due to comorbidities: morbid obesity, HFrEF. Patient was told about increased risk of cardiac and respiratory events, including death. )        Anesthesia Quick Evaluation

## 2021-08-18 NOTE — Progress Notes (Signed)
Patient alert and oriented x 4, affect is flat but brightens upon approach. Patent denies SI/HI/AVH his thoughts are organized and coherent. Patient denies SI/HI/AVH 15 minutes safety checks maintained will continue to monitor.

## 2021-08-18 NOTE — Progress Notes (Signed)
D: Pt alert and oriented. Pt reports experiencing anxiety. Pt denies experiencing any pain at this time. Pt denies experiencing any SI/HI, or AVH at this time.   Pt can to the nurse's station relieved and sharing that he spoke with his landlord and that they are going to work with him. Pt reports that this helps a lot with the relief anxiety and depression he's been experiencing.   Pt was bright upon approach this morning and shared that he was only experiencing some anxiety. Pt reports he feels ECT has been helping.  Pt tolerated ECT procedure well today. Pt was ready to eat and ambulating upon return to the unit. Pt had c/o small headache however refused any prn pain management medication. Pt A/O x 4.   A: Scheduled medications administered to pt, per MD orders. Support and encouragement provided. Frequent verbal contact made. Routine safety checks conducted q15 minutes.   R: No adverse drug reactions noted. Pt verbally contracts for safety at this time. Pt complaint with medications and treatment plan. Pt interacts well with others on the unit. Pt remains safe at this time. Will continue to monitor.

## 2021-08-18 NOTE — Progress Notes (Signed)
[facility] MD Progress Note  08/18/2021 4:19 PM Luis [name]  MRN:  [id number] Subjective: Follow-up 51 year old man with depression.  Patient had ECT today.  Even prior to treatment reports his mood is significantly better.  Much less sad.  Much decreased suicidal thought.  No new physical complaints. Principal Problem: MDD (major depressive disorder), recurrent severe, without psychosis ([place]) Diagnosis: Principal Problem:   MDD (major depressive disorder), recurrent severe, without psychosis ([place]) Active Problems:   Chronic systolic CHF (congestive heart failure) (HCC)   Pacemaker   Diabetes mellitus without complication ([place])   Hypertension   GERD (gastroesophageal reflux disease)   Cocaine use disorder ([place])   Sleep apnea  Total Time spent with patient: 30 minutes  Past Psychiatric History: Past history of depression and substance abuse.  Chronic anxiety.  Multiple medical problems  Past Medical History:  Past Medical History:  Diagnosis Date   Anxiety    Biventricular ICD (implantable cardioverter-defibrillator) in place    Chronic systolic CHF (congestive heart failure) (HCC)    Cocaine use    Diabetes mellitus without complication (HCC)    GERD (gastroesophageal reflux disease)    HLD (hyperlipidemia)    Hypertension    Morbid obesity (HCC)    NICM (nonischemic cardiomyopathy) (HCC)    OSA (obstructive sleep apnea)    PTSD (post-traumatic stress disorder)    on Depakote   Refusal of blood transfusions as patient is Jehovah's Witness    Tobacco abuse     Past Surgical History:  Procedure Laterality Date   BIV ICD INSERTION CRT-D     FOOT FRACTURE SURGERY     ROTATOR CUFF REPAIR     TONSILLECTOMY     Family History:  Family History  Problem Relation Age of Onset   Hypertension Mother    Bone cancer Father    Lung cancer Father    Family Psychiatric  History: See previous Social History:  Social History   Substance and Sexual Activity  Alcohol Use Not  Currently     Social History   Substance and Sexual Activity  Drug Use Yes   Types: Cocaine    Social History   Socioeconomic History   Marital status: Legally Separated    Spouse name: Not on file   Number of children: Not on file   Years of education: Not on file   Highest education level: Not on file  Occupational History   Not on file  Tobacco Use   Smoking status: Every Day    Types: Cigarettes   Smokeless tobacco: Never   Tobacco comments:    occasionally  Vaping Use   Vaping Use: Never used  Substance and Sexual Activity   Alcohol use: Not Currently   Drug use: Yes    Types: Cocaine   Sexual activity: Not on file  Other Topics Concern   Not on file  Social History Narrative   Not on file   Social Determinants of Health   Financial Resource Strain: Not on file  Food Insecurity: Not on file  Transportation Needs: Not on file  Physical Activity: Not on file  Stress: Not on file  Social Connections: Not on file   Additional Social History:                         Sleep: Fair  Appetite:  Fair  Current Medications: Current Facility-Administered Medications  Medication Dose Route Frequency Provider Last Rate Last Admin   acetaminophen (TYLENOL)  tablet 650 mg  650 mg Oral Q6H PRN Lucky Rathke, FNP   650 mg at 08/17/21 1502   alum & mag hydroxide-simeth (MAALOX/MYLANTA) 200-200-20 MG/5ML suspension 30 mL  30 mL Oral Q4H PRN Lucky Rathke, FNP       buPROPion (WELLBUTRIN XL) 24 hr tablet 300 mg  300 mg Oral Daily [place] Liz T, MD   300 mg at 08/18/21 0801   carvedilol (COREG) tablet 6.25 mg  6.25 mg Oral BID WC Teneka Malmberg, Madie Reno, MD   6.25 mg at 08/18/21 0801   clopidogrel (PLAVIX) tablet 75 mg  75 mg Oral Daily Larenzo Caples, Madie Reno, MD   75 mg at 08/18/21 0801   DULoxetine (CYMBALTA) DR capsule 90 mg  90 mg Oral Daily Elaine Middleton, Madie Reno, MD   90 mg at 08/18/21 0800   furosemide (LASIX) tablet 80 mg  80 mg Oral Daily Cicley Ganesh, Madie Reno, MD   80 mg at  08/18/21 0801   glycopyrrolate (ROBINUL) injection 0.1 mg  0.1 mg Intravenous Once Herrick Hartog, Madie Reno, MD       glycopyrrolate (ROBINUL) injection 0.1 mg  0.1 mg Intravenous Once Abisai Deer, Madie Reno, MD       glycopyrrolate (ROBINUL) injection 0.2 mg  0.2 mg Intravenous Once Evey Mcmahan, Madie Reno, MD       hydrOXYzine (ATARAX) tablet 25 mg  25 mg Oral TID PRN Lucky Rathke, FNP   25 mg at 08/17/21 1452   ibuprofen (ADVIL) tablet 600 mg  600 mg Oral Q6H PRN Mateen Franssen, Madie Reno, MD       insulin aspart (novoLOG) injection 0-5 Units  0-5 Units Subcutaneous QHS Aanshi Batchelder T, MD   2 Units at 08/15/21 0059   insulin aspart (novoLOG) injection 0-9 Units  0-9 Units Subcutaneous TID WC Lorelie Biermann T, MD   1 Units at 08/16/21 1219   insulin aspart protamine- aspart (NOVOLOG MIX 70/30) injection 60 Units  60 Units Subcutaneous TID PC Asees Manfredi T, MD   60 Units at 08/17/21 1718   magnesium hydroxide (MILK OF MAGNESIA) suspension 30 mL  30 mL Oral Daily PRN Lucky Rathke, FNP       midazolam (VERSED) injection 2 mg  2 mg Intravenous Once Zev Blue T, MD       midazolam (VERSED) injection 2 mg  2 mg Intravenous Once Sinaya Minogue, Madie Reno, MD       ondansetron [facility]) injection 4 mg  4 mg Intravenous Once PRN Arita Miss, MD       ondansetron [facility]) injection 4 mg  4 mg Intravenous Once PRN Arita Miss, MD       pantoprazole (PROTONIX) EC tablet 40 mg  40 mg Oral Daily Tesa Meadors T, MD   40 mg at 08/18/21 0801   prazosin (MINIPRESS) capsule 2 mg  2 mg Oral QHS Julieana Eshleman T, MD   2 mg at 08/18/21 0224   rosuvastatin (CRESTOR) tablet 20 mg  20 mg Oral Daily Calissa Swenor T, MD   20 mg at 08/18/21 0801   traZODone (DESYREL) tablet 200 mg  200 mg Oral QHS Kissy Cielo T, MD   200 mg at 08/18/21 [id number]    Lab Results:  Results for orders placed or performed during the hospital encounter of 08/06/21 (from the past 48 hour(s))  Glucose, capillary     Status: None   Collection Time: 08/16/21  4:20 PM  Result Value  Ref Range   Glucose-Capillary 99 70 - 99  mg/dL    Comment: Glucose reference range applies only to samples taken after fasting for at least 8 hours.  Glucose, capillary     Status: Abnormal   Collection Time: 08/16/21  8:26 PM  Result Value Ref Range   Glucose-Capillary 128 (H) 70 - 99 mg/dL    Comment: Glucose reference range applies only to samples taken after fasting for at least 8 hours.   Comment 1 Notify RN   Glucose, capillary     Status: Abnormal   Collection Time: 08/17/21  7:51 AM  Result Value Ref Range   Glucose-Capillary 104 (H) 70 - 99 mg/dL    Comment: Glucose reference range applies only to samples taken after fasting for at least 8 hours.  Glucose, capillary     Status: Abnormal   Collection Time: 08/17/21 11:51 AM  Result Value Ref Range   Glucose-Capillary 118 (H) 70 - 99 mg/dL    Comment: Glucose reference range applies only to samples taken after fasting for at least 8 hours.  Glucose, capillary     Status: Abnormal   Collection Time: 08/17/21  4:20 PM  Result Value Ref Range   Glucose-Capillary 118 (H) 70 - 99 mg/dL    Comment: Glucose reference range applies only to samples taken after fasting for at least 8 hours.  Glucose, capillary     Status: None   Collection Time: 08/17/21  9:17 PM  Result Value Ref Range   Glucose-Capillary 92 70 - 99 mg/dL    Comment: Glucose reference range applies only to samples taken after fasting for at least 8 hours.  Glucose, capillary     Status: None   Collection Time: 08/18/21  6:03 AM  Result Value Ref Range   Glucose-Capillary 87 70 - 99 mg/dL    Comment: Glucose reference range applies only to samples taken after fasting for at least 8 hours.    Blood Alcohol level:  Lab Results  Component Value Date   ETH <10 08/05/2021   ETH <10 123456    Metabolic Disorder Labs: Lab Results  Component Value Date   HGBA1C 10.0 (H) 08/05/2021   MPG 240.3 08/05/2021   MPG 205.86 01/27/2021   No results found for:  PROLACTIN Lab Results  Component Value Date   CHOL 143 08/05/2021   TRIG 245 (H) 08/05/2021   HDL 33 (L) 08/05/2021   CHOLHDL 4.3 08/05/2021   VLDL 49 (H) 08/05/2021   LDLCALC 61 08/05/2021   LDLCALC 71 01/27/2021    Physical Findings: AIMS:  , ,  ,  ,    CIWA:    COWS:     Musculoskeletal: Strength & Muscle Tone: within normal limits Gait & Station: normal Patient leans: N/A  Psychiatric Specialty Exam:  Presentation  General Appearance: Disheveled  Eye Contact:Minimal  Speech:Clear and Coherent; Normal Rate  Speech Volume:Normal  Handedness:Right   Mood and Affect  Mood:Irritable  Affect:Congruent; Depressed   Thought Process  Thought Processes:Coherent  Descriptions of Associations:Intact  Orientation:Full (Time, Place and Person)  Thought Content:Logical  History of Schizophrenia/Schizoaffective disorder:No  Duration of Psychotic Symptoms:No data recorded Hallucinations:No data recorded Ideas of Reference:None  Suicidal Thoughts:No data recorded Homicidal Thoughts:No data recorded  Sensorium  Memory:Immediate Good; Recent Good; Remote Good  Judgment:Poor  Insight:Poor   Executive Functions  Concentration:Good  Attention Span:Good  [place] of Knowledge:Good  Language:Good   Psychomotor Activity  Psychomotor Activity:No data recorded  Assets  Assets:Communication Skills; Desire for Improvement; Housing; Intimacy; Leisure Time; Physical  Health; Resilience; Social Support   Sleep  Sleep:No data recorded   Physical Exam: Physical Exam Vitals and nursing note reviewed.  Constitutional:      Appearance: Normal appearance.  HENT:     Head: Normocephalic and atraumatic.     Mouth/Throat:     Pharynx: Oropharynx is clear.  Eyes:     Pupils: Pupils are equal, round, and reactive to light.  Cardiovascular:     Rate and Rhythm: Normal rate and regular rhythm.  Pulmonary:     Effort: Pulmonary effort is normal.      Breath sounds: Normal breath sounds.  Abdominal:     General: Abdomen is flat.     Palpations: Abdomen is soft.  Musculoskeletal:        General: Normal range of motion.  Skin:    General: Skin is warm and dry.  Neurological:     General: No focal deficit present.     Mental Status: He is alert. Mental status is at baseline.  Psychiatric:        Mood and Affect: Mood normal.        Thought Content: Thought content normal.   Review of Systems  Constitutional: Negative.   HENT: Negative.    Eyes: Negative.   Respiratory: Negative.    Cardiovascular: Negative.   Gastrointestinal: Negative.   Musculoskeletal: Negative.   Skin: Negative.   Neurological: Negative.   Psychiatric/Behavioral: Negative.    Blood pressure (!) 120/99, pulse 85, temperature (!) 97.2 F (36.2 C), resp. rate (!) 22, height 5\' 5"  (1.651 m), weight 123.8 kg, SpO2 93 %. Body mass index is 45.42 kg/m.   Treatment Plan Summary: Medication management and Plan no change to medication management.  Patient had ECT today.  We will reassess tomorrow as to his condition because we may be nearing a point where we can look towards discharge planning.  It sounds like he has had a breakthrough with being able to go back to his apartment.  Alethia Berthold, MD 08/18/2021, 4:19 PM

## 2021-08-18 NOTE — Progress Notes (Signed)
Recreation Therapy Notes ° ° °Date: 08/18/2021 ° °Time: 10:30 a.m.   ° °Location: Craft room   ° °Behavioral response: N/A °  °Intervention Topic: Self-care   ° °Discussion/Intervention: °Patient refused to attend group.  ° °Clinical Observations/Feedback:  °Patient refused to attend group.  °  °Jozef Eisenbeis LRT/CTRS ° ° ° ° ° ° ° °Modestine Scherzinger °08/18/2021 11:47 AM °

## 2021-08-18 NOTE — Procedures (Signed)
ECT SERVICES Physicians Interval Evaluation & Treatment Note  Patient Identification: THINH BEUTLER MRN:  [id number] Date of Evaluation:  08/18/2021 TX #: 4  MADRS:   MMSE:   P.E. Findings:  No change to physical exam  Psychiatric Interval Note:  Mood appears to be brighter and more upbeat and positive  Subjective:  Patient is a 51 y.o. male seen for evaluation for Electroconvulsive Therapy. Patient reports feeling less depressed  Treatment Summary:   [x]   Right Unilateral             []  Bilateral   % Energy : 0.3 ms 100%   Impedance: 1040 ohms  Seizure Energy Index: 4789 V squared  Postictal Suppression Index: 93%  Seizure Concordance Index: No reading  Medications  Pre Shock: Robinul 0.1 mg labetalol 10 mg ketamine 120 mg succinylcholine 150 mg  Post Shock: Versed 2 mg  Seizure Duration: 9 seconds EMG 9 seconds EEG   Comments: Despite switching to ketamine and being at 100% he is only getting 9 seconds seizures.  Probably no benefit to be had from further treatment.  I will talk with him tomorrow about possible discharge  Lungs:  []   Clear to auscultation               []  Other:   Heart:    []   Regular rhythm             []  irregular rhythm    []   Previous H&P reviewed, patient examined and there are NO CHANGES                 []   Previous H&P reviewed, patient examined and there are changes noted.   Alethia Berthold, MD 2/20/20234:52 PM

## 2021-08-18 NOTE — BH IP Treatment Plan (Signed)
Interdisciplinary Treatment and Diagnostic Plan Update  08/18/2021 Time of Session: 0900 Luis [name] MRN: [id number]  Principal Diagnosis: MDD (major depressive disorder), recurrent severe, without psychosis ([place])  Secondary Diagnoses: Principal Problem:   MDD (major depressive disorder), recurrent severe, without psychosis ([place]) Active Problems:   Chronic systolic CHF (congestive heart failure) ([place])   Pacemaker   Diabetes mellitus without complication ([place])   Hypertension   GERD (gastroesophageal reflux disease)   Cocaine use disorder ([place])   Sleep apnea   Current Medications:  Current Facility-Administered Medications  Medication Dose Route Frequency Provider Last Rate Last Admin   acetaminophen (TYLENOL) tablet 650 mg  650 mg Oral Q6H PRN Lucky Rathke, FNP   650 mg at 08/17/21 1502   alum & mag hydroxide-simeth (MAALOX/MYLANTA) 200-200-20 MG/5ML suspension 30 mL  30 mL Oral Q4H PRN Lucky Rathke, FNP       buPROPion (WELLBUTRIN XL) 24 hr tablet 300 mg  300 mg Oral Daily Clapacs, John T, MD   300 mg at 08/18/21 0801   carvedilol (COREG) tablet 6.25 mg  6.25 mg Oral BID WC Clapacs, Madie Reno, MD   6.25 mg at 08/18/21 0801   clopidogrel (PLAVIX) tablet 75 mg  75 mg Oral Daily Clapacs, Madie Reno, MD   75 mg at 08/18/21 0801   DULoxetine (CYMBALTA) DR capsule 90 mg  90 mg Oral Daily Clapacs, Madie Reno, MD   90 mg at 08/18/21 0800   furosemide (LASIX) tablet 80 mg  80 mg Oral Daily Clapacs, Madie Reno, MD   80 mg at 08/18/21 0801   glycopyrrolate (ROBINUL) injection 0.1 mg  0.1 mg Intravenous Once Clapacs, Madie Reno, MD       glycopyrrolate (ROBINUL) injection 0.2 mg  0.2 mg Intravenous Once Clapacs, Madie Reno, MD       hydrOXYzine (ATARAX) tablet 25 mg  25 mg Oral TID PRN Lucky Rathke, FNP   25 mg at 08/17/21 1452   ibuprofen (ADVIL) tablet 600 mg  600 mg Oral Q6H PRN Clapacs, Madie Reno, MD       insulin aspart (novoLOG) injection 0-5 Units  0-5 Units Subcutaneous QHS Clapacs, John T, MD   2  Units at 08/15/21 0059   insulin aspart (novoLOG) injection 0-9 Units  0-9 Units Subcutaneous TID WC Clapacs, John T, MD   1 Units at 08/16/21 1219   insulin aspart protamine- aspart (NOVOLOG MIX 70/30) injection 60 Units  60 Units Subcutaneous TID PC Clapacs, John T, MD   60 Units at 08/17/21 1718   magnesium hydroxide (MILK OF MAGNESIA) suspension 30 mL  30 mL Oral Daily PRN Lucky Rathke, FNP       midazolam (VERSED) injection 2 mg  2 mg Intravenous Once Clapacs, John T, MD       midazolam (VERSED) injection 2 mg  2 mg Intravenous Once Clapacs, Madie Reno, MD       ondansetron (ZOFRAN) injection 4 mg  4 mg Intravenous Once PRN Arita Miss, MD       ondansetron [facility]) injection 4 mg  4 mg Intravenous Once PRN Arita Miss, MD       pantoprazole (PROTONIX) EC tablet 40 mg  40 mg Oral Daily Clapacs, John T, MD   40 mg at 08/18/21 0801   prazosin (MINIPRESS) capsule 2 mg  2 mg Oral QHS Clapacs, John T, MD   2 mg at 08/18/21 0224   rosuvastatin (CRESTOR) tablet 20 mg  20 mg Oral Daily  Clapacs, Madie Reno, MD   20 mg at 08/18/21 0801   traZODone (DESYREL) tablet 200 mg  200 mg Oral QHS Clapacs, John T, MD   200 mg at 08/18/21 0224   PTA Medications: Medications Prior to Admission  Medication Sig Dispense Refill Last Dose   Artificial Tear Solution (TEARS NATURALE OP) Place 2 drops into both eyes in the morning and at bedtime.   08/10/2021   buPROPion (WELLBUTRIN XL) 150 MG 24 hr tablet Take 1 tablet (150 mg total) by mouth every morning. 30 tablet 1 08/10/2021   carvedilol (COREG) 6.25 MG tablet Take 6.25 mg by mouth 2 (two) times daily.   08/10/2021   clopidogrel (PLAVIX) 75 MG tablet Take 75 mg by mouth daily.   08/10/2021   clotrimazole-betamethasone (LOTRISONE) cream Apply 1 application topically in the morning and at bedtime. Applied to affected area.   08/10/2021   divalproex (DEPAKOTE) 500 MG DR tablet Take 1 tablet (500 mg total) by mouth 2 (two) times daily. 60 tablet 1 08/10/2021   DULoxetine  (CYMBALTA) 30 MG capsule Take 90 mg by mouth daily.   08/10/2021   furosemide (LASIX) 80 MG tablet Take 80 mg by mouth daily.   08/10/2021   HUMALOG MIX 75/25 KWIKPEN (75-25) 100 UNIT/ML Kwikpen Inject 20 Units into the skin 2 (two) times daily before a meal. 15 mL 11 08/10/2021   MELATONIN GUMMIES PO Take 3 tablets by mouth at bedtime.   08/10/2021   Multiple Vitamin (MULTIVITAMIN WITH MINERALS) TABS tablet Take 1 tablet by mouth daily.   08/10/2021   oxyCODONE-acetaminophen (PERCOCET) 7.5-325 MG tablet Take 0.5-1 tablets by mouth every 12 (twelve) hours as needed for severe pain.   08/10/2021   pantoprazole (PROTONIX) 40 MG tablet Take 40 mg by mouth daily.   08/10/2021   prazosin (MINIPRESS) 2 MG capsule Take 1 capsule (2 mg total) by mouth at bedtime. 30 capsule 1 08/10/2021   ramelteon (ROZEREM) 8 MG tablet Take 1 tablet (8 mg total) by mouth at bedtime. 30 tablet 1 08/10/2021   rosuvastatin (CRESTOR) 20 MG tablet Take 20 mg by mouth every evening.   08/10/2021   sertraline (ZOLOFT) 100 MG tablet Take 2 tablets (200 mg total) by mouth daily. 60 tablet 1 08/10/2021   traZODone (DESYREL) 100 MG tablet Take 200 mg by mouth at bedtime.   [id number]   TRULICITY 1.5 0000000 SOPN Inject 1.5 mg into the skin once a week.   08/10/2021   VICTOZA 18 MG/3ML SOPN Inject 1.8 mg into the skin daily. 9 mL 0     Patient Stressors: Financial difficulties    Patient Strengths: Supportive family/friends   Treatment Modalities: Medication Management, Group therapy, Case management,  1 to 1 session with clinician, Psychoeducation, Recreational therapy.   Physician Treatment Plan for Primary Diagnosis: MDD (major depressive disorder), recurrent severe, without psychosis ([place]) Long Term Goal(s): Improvement in symptoms so as ready for discharge   Short Term Goals: Ability to identify and develop effective coping behaviors will improve Ability to maintain clinical measurements within normal limits will  improve Compliance with prescribed medications will improve Ability to disclose and discuss suicidal ideas Ability to demonstrate self-control will improve  Medication Management: Evaluate patient's response, side effects, and tolerance of medication regimen.  Therapeutic Interventions: 1 to 1 sessions, Unit Group sessions and Medication administration.  Evaluation of Outcomes: Progressing  Physician Treatment Plan for Secondary Diagnosis: Principal Problem:   MDD (major depressive disorder), recurrent severe, without psychosis ([place]) Active  Problems:   Chronic systolic CHF (congestive heart failure) (HCC)   Pacemaker   Diabetes mellitus without complication (HCC)   Hypertension   GERD (gastroesophageal reflux disease)   Cocaine use disorder (HCC)   Sleep apnea  Long Term Goal(s): Improvement in symptoms so as ready for discharge   Short Term Goals: Ability to identify and develop effective coping behaviors will improve Ability to maintain clinical measurements within normal limits will improve Compliance with prescribed medications will improve Ability to disclose and discuss suicidal ideas Ability to demonstrate self-control will improve     Medication Management: Evaluate patient's response, side effects, and tolerance of medication regimen.  Therapeutic Interventions: 1 to 1 sessions, Unit Group sessions and Medication administration.  Evaluation of Outcomes: Progressing   RN Treatment Plan for Primary Diagnosis: MDD (major depressive disorder), recurrent severe, without psychosis ([place]) Long Term Goal(s): Knowledge of disease and therapeutic regimen to maintain health will improve  Short Term Goals: Ability to remain free from injury will improve, Ability to verbalize frustration and anger appropriately will improve, Ability to demonstrate self-control, Ability to participate in decision making will improve, Ability to verbalize feelings will improve, Ability to disclose  and discuss suicidal ideas, Ability to identify and develop effective coping behaviors will improve, and Compliance with prescribed medications will improve  Medication Management: RN will administer medications as ordered by provider, will assess and evaluate patient's response and provide education to patient for prescribed medication. RN will report any adverse and/or side effects to prescribing provider.  Therapeutic Interventions: 1 on 1 counseling sessions, Psychoeducation, Medication administration, Evaluate responses to treatment, Monitor vital signs and CBGs as ordered, Perform/monitor CIWA, COWS, AIMS and Fall Risk screenings as ordered, Perform wound care treatments as ordered.  Evaluation of Outcomes: Progressing   LCSW Treatment Plan for Primary Diagnosis: MDD (major depressive disorder), recurrent severe, without psychosis ([place]) Long Term Goal(s): Safe transition to appropriate next level of care at discharge, Engage patient in therapeutic group addressing interpersonal concerns.  Short Term Goals: Engage patient in aftercare planning with referrals and resources, Increase social support, Increase ability to appropriately verbalize feelings, Increase emotional regulation, Facilitate acceptance of mental health diagnosis and concerns, Facilitate patient progression through stages of change regarding substance use diagnoses and concerns, and Identify triggers associated with mental health/substance abuse issues  Therapeutic Interventions: Assess for all discharge needs, 1 to 1 time with Social worker, Explore available resources and support systems, Assess for adequacy in community support network, Educate family and significant other(s) on suicide prevention, Complete Psychosocial Assessment, Interpersonal group therapy.  Evaluation of Outcomes: Progressing   Progress in Treatment: Attending groups: Yes. Participating in groups: No. Taking medication as prescribed: Yes. Toleration  medication: Yes. Family/Significant other contact made: Yes, individual(s) contacted:  SPE completed with Minus Liberty, friend, [phone] Patient understands diagnosis: Yes. Discussing patient identified problems/goals with staff: Yes. Medical problems stabilized or resolved: Yes. Denies suicidal/homicidal ideation: No. Issues/concerns per patient self-inventory: Yes. Other: none    New problem(s) identified: No, Describe:  No additional problems identified at this time. Patient will continue ECT w/ Dr. Weber Cooks for mood stabilization.   New Short Term/Long Term Goal(s): Patient to work towards detox, medication management for mood stabilization; elimination of SI thoughts; development of comprehensive mental wellness/sobriety plan.  Patient Goals:  No additional goals identified at this time.    Discharge Plan or Barriers: No psychosocial barriers identified at this time, patient to return to place of residence when appropriate for discharge assuming he has not  been evicted due to non payment. Patient has not yet paid his rent for the month of February.   Reason for Continuation of Hospitalization: Depression  Estimated Length of Stay: 1-7 days    Scribe for Treatment Team: Larose Kells 08/18/2021 8:51 AM

## 2021-08-19 DIAGNOSIS — F332 Major depressive disorder, recurrent severe without psychotic features: Secondary | ICD-10-CM | POA: Diagnosis not present

## 2021-08-19 LAB — GLUCOSE, CAPILLARY
Glucose-Capillary: 197 mg/dL — ABNORMAL HIGH (ref 70–99)
Glucose-Capillary: 133 mg/dL — ABNORMAL HIGH (ref 70–99)
Glucose-Capillary: 186 mg/dL — ABNORMAL HIGH (ref 70–99)

## 2021-08-19 MED ORDER — BUPROPION HCL ER (XL) 150 MG PO TB24
450.0000 mg | ORAL_TABLET | Freq: Every day | ORAL | Status: DC
  Administered 2021-08-20 – 2021-08-22 (×3): 450 mg via ORAL
  Filled 2021-08-19 (×3): qty 3

## 2021-08-19 NOTE — Progress Notes (Signed)
[facility] MD Progress Note  08/19/2021 4:06 PM Luis [name]  MRN:  [id number] Subjective: Follow-up patient with depression.  Patient continues to report that his mood is improved.  He is upbeat enthusiastic until I told him that I think we are at the end of his course of ECT at which point he became very nervous.  It sounds like he is not as certain of his discharge plan as he thought he was.  Denies suicidal ideation.  Eating well.  Up out of bed and interactive appropriately Principal Problem: MDD (major depressive disorder), recurrent severe, without psychosis ([place]) Diagnosis: Principal Problem:   MDD (major depressive disorder), recurrent severe, without psychosis ([place]) Active Problems:   Chronic systolic CHF (congestive heart failure) (HCC)   Pacemaker   Diabetes mellitus without complication (HCC)   Hypertension   GERD (gastroesophageal reflux disease)   Cocaine use disorder ([place])   Sleep apnea  Total Time spent with patient: 30 minutes  Past Psychiatric History: Past history of recurrent depression and substance abuse  Past Medical History:  Past Medical History:  Diagnosis Date   Anxiety    Biventricular ICD (implantable cardioverter-defibrillator) in place    Chronic systolic CHF (congestive heart failure) ([place])    Cocaine use    Diabetes mellitus without complication (HCC)    GERD (gastroesophageal reflux disease)    HLD (hyperlipidemia)    Hypertension    Morbid obesity (HCC)    NICM (nonischemic cardiomyopathy) (HCC)    OSA (obstructive sleep apnea)    PTSD (post-traumatic stress disorder)    on Depakote   Refusal of blood transfusions as patient is Jehovah's Witness    Tobacco abuse     Past Surgical History:  Procedure Laterality Date   BIV ICD INSERTION CRT-D     FOOT FRACTURE SURGERY     ROTATOR CUFF REPAIR     TONSILLECTOMY     Family History:  Family History  Problem Relation Age of Onset   Hypertension Mother    Bone cancer Father    Lung  cancer Father    Family Psychiatric  History: See previous Social History:  Social History   Substance and Sexual Activity  Alcohol Use Not Currently     Social History   Substance and Sexual Activity  Drug Use Yes   Types: Cocaine    Social History   Socioeconomic History   Marital status: Legally Separated    Spouse name: Not on file   Number of children: Not on file   Years of education: Not on file   Highest education level: Not on file  Occupational History   Not on file  Tobacco Use   Smoking status: Every Day    Types: Cigarettes   Smokeless tobacco: Never   Tobacco comments:    occasionally  Vaping Use   Vaping Use: Never used  Substance and Sexual Activity   Alcohol use: Not Currently   Drug use: Yes    Types: Cocaine   Sexual activity: Not on file  Other Topics Concern   Not on file  Social History Narrative   Not on file   Social Determinants of Health   Financial Resource Strain: Not on file  Food Insecurity: Not on file  Transportation Needs: Not on file  Physical Activity: Not on file  Stress: Not on file  Social Connections: Not on file   Additional Social History:  Sleep: Fair  Appetite:  Fair  Current Medications: Current Facility-Administered Medications  Medication Dose Route Frequency Provider Last Rate Last Admin   acetaminophen (TYLENOL) tablet 650 mg  650 mg Oral Q6H PRN Lucky Rathke, FNP   650 mg at 08/17/21 1502   alum & mag hydroxide-simeth (MAALOX/MYLANTA) 200-200-20 MG/5ML suspension 30 mL  30 mL Oral Q4H PRN Lucky Rathke, FNP       Derrill Memo ON 08/20/2021] buPROPion (WELLBUTRIN XL) 24 hr tablet 450 mg  450 mg Oral Daily Adelayde Minney, Madie Reno, MD       carvedilol (COREG) tablet 6.25 mg  6.25 mg Oral BID WC Mayumi Summerson, Madie Reno, MD   6.25 mg at 08/19/21 [id number]   clopidogrel (PLAVIX) tablet 75 mg  75 mg Oral Daily Catia Todorov, Madie Reno, MD   75 mg at 08/19/21 [id number]   DULoxetine (CYMBALTA) DR capsule 90 mg  90 mg  Oral Daily Arnie Maiolo, Madie Reno, MD   90 mg at 08/19/21 [id number]   furosemide (LASIX) tablet 80 mg  80 mg Oral Daily Stephani Janak, Madie Reno, MD   80 mg at 08/19/21 [id number]   glycopyrrolate (ROBINUL) injection 0.1 mg  0.1 mg Intravenous Once Nechuma Boven, Madie Reno, MD       glycopyrrolate (ROBINUL) injection 0.1 mg  0.1 mg Intravenous Once Shade Kaley, Madie Reno, MD       glycopyrrolate (ROBINUL) injection 0.2 mg  0.2 mg Intravenous Once Zhuri Krass, Madie Reno, MD       hydrOXYzine (ATARAX) tablet 25 mg  25 mg Oral TID PRN Lucky Rathke, FNP   25 mg at 08/17/21 1452   ibuprofen (ADVIL) tablet 600 mg  600 mg Oral Q6H PRN Adreana Coull, Madie Reno, MD       insulin aspart (novoLOG) injection 0-5 Units  0-5 Units Subcutaneous QHS Ciera Beckum T, MD   2 Units at 08/18/21 2119   insulin aspart (novoLOG) injection 0-9 Units  0-9 Units Subcutaneous TID WC Neil Brickell, Madie Reno, MD   3 Units at 08/19/21 1222   insulin aspart protamine- aspart (NOVOLOG MIX 70/30) injection 60 Units  60 Units Subcutaneous TID PC Vedha Tercero T, MD   60 Units at 08/19/21 1221   magnesium hydroxide (MILK OF MAGNESIA) suspension 30 mL  30 mL Oral Daily PRN Lucky Rathke, FNP       midazolam (VERSED) injection 2 mg  2 mg Intravenous Once Zuri Lascala T, MD       midazolam (VERSED) injection 2 mg  2 mg Intravenous Once Skylyn Slezak, Madie Reno, MD       ondansetron [facility]) injection 4 mg  4 mg Intravenous Once PRN Arita Miss, MD       ondansetron [facility]) injection 4 mg  4 mg Intravenous Once PRN Arita Miss, MD       pantoprazole (PROTONIX) EC tablet 40 mg  40 mg Oral Daily Marielouise Amey, Madie Reno, MD   40 mg at 08/19/21 [id number]   prazosin (MINIPRESS) capsule 2 mg  2 mg Oral QHS Elward Nocera T, MD   2 mg at 08/18/21 2120   rosuvastatin (CRESTOR) tablet 20 mg  20 mg Oral Daily Varnell Donate, Madie Reno, MD   20 mg at 08/19/21 [id number]   traZODone (DESYREL) tablet 200 mg  200 mg Oral QHS Taccara Bushnell, Madie Reno, MD   200 mg at 08/18/21 2120    Lab Results:  Results for orders placed or performed during the hospital  encounter of 08/06/21 (from the past 48 hour(s))  Glucose, capillary  Status: Abnormal   Collection Time: 08/17/21  4:20 PM  Result Value Ref Range   Glucose-Capillary 118 (H) 70 - 99 mg/dL    Comment: Glucose reference range applies only to samples taken after fasting for at least 8 hours.  Glucose, capillary     Status: None   Collection Time: 08/17/21  9:17 PM  Result Value Ref Range   Glucose-Capillary 92 70 - 99 mg/dL    Comment: Glucose reference range applies only to samples taken after fasting for at least 8 hours.  Glucose, capillary     Status: None   Collection Time: 08/18/21  6:03 AM  Result Value Ref Range   Glucose-Capillary 87 70 - 99 mg/dL    Comment: Glucose reference range applies only to samples taken after fasting for at least 8 hours.  Glucose, capillary     Status: Abnormal   Collection Time: 08/18/21  4:32 PM  Result Value Ref Range   Glucose-Capillary 197 (H) 70 - 99 mg/dL    Comment: Glucose reference range applies only to samples taken after fasting for at least 8 hours.  Glucose, capillary     Status: Abnormal   Collection Time: 08/19/21  6:46 AM  Result Value Ref Range   Glucose-Capillary 133 (H) 70 - 99 mg/dL    Comment: Glucose reference range applies only to samples taken after fasting for at least 8 hours.  Glucose, capillary     Status: Abnormal   Collection Time: 08/19/21  9:04 AM  Result Value Ref Range   Glucose-Capillary 186 (H) 70 - 99 mg/dL    Comment: Glucose reference range applies only to samples taken after fasting for at least 8 hours.    Blood Alcohol level:  Lab Results  Component Value Date   ETH <10 08/05/2021   ETH <10 123456    Metabolic Disorder Labs: Lab Results  Component Value Date   HGBA1C 10.0 (H) 08/05/2021   MPG 240.3 08/05/2021   MPG 205.86 01/27/2021   No results found for: PROLACTIN Lab Results  Component Value Date   CHOL 143 08/05/2021   TRIG 245 (H) 08/05/2021   HDL 33 (L) 08/05/2021   CHOLHDL  4.3 08/05/2021   VLDL 49 (H) 08/05/2021   LDLCALC 61 08/05/2021   LDLCALC 71 01/27/2021    Physical Findings: AIMS:  , ,  ,  ,    CIWA:    COWS:     Musculoskeletal: Strength & Muscle Tone: within normal limits Gait & Station: normal Patient leans: N/A  Psychiatric Specialty Exam:  Presentation  General Appearance: Disheveled  Eye Contact:Minimal  Speech:Clear and Coherent; Normal Rate  Speech Volume:Normal  Handedness:Right   Mood and Affect  Mood:Irritable  Affect:Congruent; Depressed   Thought Process  Thought Processes:Coherent  Descriptions of Associations:Intact  Orientation:Full (Time, Place and Person)  Thought Content:Logical  History of Schizophrenia/Schizoaffective disorder:No  Duration of Psychotic Symptoms:No data recorded Hallucinations:No data recorded Ideas of Reference:None  Suicidal Thoughts:No data recorded Homicidal Thoughts:No data recorded  Sensorium  Memory:Immediate Good; Recent Good; Remote Good  Judgment:Poor  Insight:Poor   Executive Functions  Concentration:Good  Attention Span:Good  [place] of Knowledge:Good  Language:Good   Psychomotor Activity  Psychomotor Activity:No data recorded  Assets  Assets:Communication Skills; Desire for Improvement; Housing; Intimacy; Leisure Time; Physical Health; Resilience; Social Support   Sleep  Sleep:No data recorded   Physical Exam: Physical Exam Vitals and nursing note reviewed.  Constitutional:      Appearance: Normal appearance.  HENT:     Head: Normocephalic and atraumatic.     Mouth/Throat:     Pharynx: Oropharynx is clear.  Eyes:     Pupils: Pupils are equal, round, and reactive to light.  Cardiovascular:     Rate and Rhythm: Normal rate and regular rhythm.  Pulmonary:     Effort: Pulmonary effort is normal.     Breath sounds: Normal breath sounds.  Abdominal:     General: Abdomen is flat.     Palpations: Abdomen is soft.   Musculoskeletal:        General: Normal range of motion.  Skin:    General: Skin is warm and dry.  Neurological:     General: No focal deficit present.     Mental Status: He is alert. Mental status is at baseline.  Psychiatric:        Attention and Perception: Attention normal.        Mood and Affect: Mood normal.        Speech: Speech normal.        Behavior: Behavior is cooperative.        Thought Content: Thought content normal.        Cognition and Memory: Cognition normal.        Judgment: Judgment normal.   Review of Systems  Constitutional: Negative.   HENT: Negative.    Eyes: Negative.   Respiratory: Negative.    Cardiovascular: Negative.   Gastrointestinal: Negative.   Musculoskeletal: Negative.   Skin: Negative.   Neurological: Negative.   Psychiatric/Behavioral: Negative.    Blood pressure 127/71, pulse 83, temperature 97.9 F (36.6 C), temperature source Oral, resp. rate 17, height 5\' 5"  (1.651 m), weight 123.8 kg, SpO2 99 %. Body mass index is 45.42 kg/m.   Treatment Plan Summary: Medication management and Plan patient now states he would not have a place to stay until Friday.  Encouraged him to keep pursuing options.  ECT is not going to be effective anymore because of how short his seizures have become.  Stop the index ECT and continue medicine  Alethia Berthold, MD 08/19/2021, 4:06 PM

## 2021-08-19 NOTE — Progress Notes (Signed)
Pt calm and pleasant during assessment denying SI/HI/AVH. Pt stated to this writer that he had a great day. Pt stated he feels ready to leave by the end of this week. Pt observed interacting appropriately with staff and peers on the unit. Pt compliant with medication administration per MD orders. Pt being monitored Q 15 minutes for safety per unit protocol. Pt remains safe on the unit.

## 2021-08-19 NOTE — Progress Notes (Signed)
Patient is calm and cooperative with assessment. Patient denies SI, HI, and AVH. Patient complains of back pain, but says that Tylenol does not help. Patient reports depression at a 2/10 and anxiety as a 8/10. Patient reports good sleep and appetite. His goal is to concentrate on himself today. Patient is compliant with scheduled medications. Insulin given later than scheduled due to meal tray coming late. Patient remains safe on the unit at this time.

## 2021-08-19 NOTE — Group Note (Signed)
[facility] LCSW Group Therapy Note   Group Date: 08/19/2021 Start Time: 1300 End Time: 1400   Type of Therapy/Topic:  Group Therapy:  Emotion Regulation  Participation Level:  Active    Description of Group:    The purpose of this group is to assist patients in learning to regulate negative emotions and experience positive emotions. Patients will be guided to discuss ways in which they have been vulnerable to their negative emotions. These vulnerabilities will be juxtaposed with experiences of positive emotions or situations, and patients challenged to use positive emotions to combat negative ones. Special emphasis will be placed on coping with negative emotions in conflict situations, and patients will process healthy conflict resolution skills.  Therapeutic Goals: Patient will identify two positive emotions or experiences to reflect on in order to balance out negative emotions:  Patient will label two or more emotions that they find the most difficult to experience:  Patient will be able to demonstrate positive conflict resolution skills through discussion or role plays:   Summary of Patient Progress:  Patient was present for the entire group; participated to the best of their ability. Patient showed insight, attempted to provide helpful advice to others. Added nuance to conversation and displayed insight into their own situation.     Therapeutic Modalities:   Cognitive Behavioral Therapy Feelings Identification Dialectical Behavioral Therapy   Starleen Arms, Student-Social Work

## 2021-08-19 NOTE — Anesthesia Postprocedure Evaluation (Signed)
Anesthesia Post Note  Patient: Luis [name]  Procedure(s) Performed: ECT TX  Patient location during evaluation: PACU Anesthesia Type: General Level of consciousness: awake and alert Pain management: pain level controlled Vital Signs Assessment: post-procedure vital signs reviewed and stable Respiratory status: spontaneous breathing, nonlabored ventilation, respiratory function stable and patient connected to nasal cannula oxygen Cardiovascular status: blood pressure returned to baseline and stable Postop Assessment: no apparent nausea or vomiting Anesthetic complications: no   No notable events documented.   Last Vitals:  Vitals:   08/18/21 2119 08/19/21 0617  BP: 111/86 127/71  Pulse:  83  Resp:  17  Temp:  36.6 C  SpO2:  99%    Last Pain:  Vitals:   08/19/21 0900  TempSrc:   PainSc: 10-Worst pain ever                 Lenard Simmer

## 2021-08-19 NOTE — Progress Notes (Addendum)
Recreation Therapy Notes   Date: 08/19/2021  Time: 11:00 am   Location: Court yard   Behavioral response: Appropriate  Intervention Topic:  Wellness   Discussion/Intervention:  Group content today was focused on Wellness. The group defined wellness and some positive ways they make decisions for themselves. Individuals expressed reasons why they neglected any wellness in the past. Patients described ways to improve wellness skills in the future. The group explained what could happen if they did not do any wellness at all. Participants express how bad choices has affected them and others around them. Individual explained the importance of wellness. The group participated in the intervention Testing my Wellness where they had a chance to identify some of their weaknesses and strengths in wellness.  Clinical Observations/Feedback: Patient came to group late and was focused on what peers and staff had to say about wellness. Individual was social with peers and staff while participating in the intervention.   Doris Mcgilvery LRT/CTRS         Michale Emmerich 08/19/2021 12:20 PM

## 2021-08-20 DIAGNOSIS — F332 Major depressive disorder, recurrent severe without psychotic features: Secondary | ICD-10-CM | POA: Diagnosis not present

## 2021-08-20 LAB — GLUCOSE, CAPILLARY
Glucose-Capillary: 240 mg/dL — ABNORMAL HIGH (ref 70–99)
Glucose-Capillary: 208 mg/dL — ABNORMAL HIGH (ref 70–99)
Glucose-Capillary: 111 mg/dL — ABNORMAL HIGH (ref 70–99)
Glucose-Capillary: 142 mg/dL — ABNORMAL HIGH (ref 70–99)
Glucose-Capillary: 114 mg/dL — ABNORMAL HIGH (ref 70–99)
Glucose-Capillary: 128 mg/dL — ABNORMAL HIGH (ref 70–99)

## 2021-08-20 NOTE — Group Note (Signed)

## 2021-08-20 NOTE — Plan of Care (Signed)
D: Pt alert and oriented. Pt rates depression 0/10, hopelessness 0/10, and anxiety 8/10. Pt goal: "My discharge plan." Pt reports energy level as normal and concentration as being good. Pt reports sleep last night as being good. Pt did receive medications for sleep and did find them helpful. Pt reports experiencing 10/10 chronic lower back pain at this time, prn medications given. Pt denies experiencing any SI/HI, or AVH at this time.   Pt has bright affect. Pt reports he'll be discharging tomorrow or Friday and is ready. Pt reports that once he leaves because he's been without smoking for so long he has no plans to go back to smoking. Pt is hopeful.   A: Scheduled medications administered to pt, per MD orders. Support and encouragement provided. Frequent verbal contact made. Routine safety checks conducted q15 minutes.   R: No adverse drug reactions noted. Pt verbally contracts for safety at this time. Pt complaint with medications and treatment plan. Pt interacts well with others on the unit. Pt remains safe at this time. Will continue to monitor.   Problem: Education: Goal: Emotional status will improve Outcome: Progressing Goal: Mental status will improve Outcome: Progressing

## 2021-08-20 NOTE — Progress Notes (Signed)
Pt calm and pleasant during assessment denying SI/HI/AVH. Pt stated to this writer that he had a great day. Pt stated he feels ready to leave by the end of this week. Pt observed interacting appropriately with staff and peers on the unit. Pt compliant with medication administration per MD orders. Pt being monitored Q 15 minutes for safety per unit protocol. Pt remains safe on the unit.  °

## 2021-08-20 NOTE — Progress Notes (Signed)
Recreation Therapy Notes ° °Date: 08/20/2021 ° °Time: 11:00 am  ° °Location: Courtyard    ° °Behavioral response: N/A °  °Intervention Topic: Leisure   ° °Discussion/Intervention: °Patient refused to attend group.  ° °Clinical Observations/Feedback:  °Patient refused to attend group.  °  °Myangel Summons LRT/CTRS ° ° ° ° ° ° ° °Olin Gurski °08/20/2021 12:02 PM °

## 2021-08-20 NOTE — Progress Notes (Signed)
[facility] MD Progress Note  08/20/2021 6:02 PM Luis [name]  MRN:  [id number] Subjective: Follow-up for this 51 year old man with depression.  Mood continues to be improved.  No longer having suicidal ideation.  Energy level improved.  Spends most of his time up out of bed interacting with others. Principal Problem: MDD (major depressive disorder), recurrent severe, without psychosis ([place]) Diagnosis: Principal Problem:   MDD (major depressive disorder), recurrent severe, without psychosis ([place]) Active Problems:   Chronic systolic CHF (congestive heart failure) (HCC)   Pacemaker   Diabetes mellitus without complication (HCC)   Hypertension   GERD (gastroesophageal reflux disease)   Cocaine use disorder ([place])   Sleep apnea  Total Time spent with patient: 20 minutes  Past Psychiatric History: Past history of depression and substance abuse problems  Past Medical History:  Past Medical History:  Diagnosis Date   Anxiety    Biventricular ICD (implantable cardioverter-defibrillator) in place    Chronic systolic CHF (congestive heart failure) ([place])    Cocaine use    Diabetes mellitus without complication (HCC)    GERD (gastroesophageal reflux disease)    HLD (hyperlipidemia)    Hypertension    Morbid obesity (HCC)    NICM (nonischemic cardiomyopathy) (HCC)    OSA (obstructive sleep apnea)    PTSD (post-traumatic stress disorder)    on Depakote   Refusal of blood transfusions as patient is Jehovah's Witness    Tobacco abuse     Past Surgical History:  Procedure Laterality Date   BIV ICD INSERTION CRT-D     FOOT FRACTURE SURGERY     ROTATOR CUFF REPAIR     TONSILLECTOMY     Family History:  Family History  Problem Relation Age of Onset   Hypertension Mother    Bone cancer Father    Lung cancer Father    Family Psychiatric  History: See previous Social History:  Social History   Substance and Sexual Activity  Alcohol Use Not Currently     Social History    Substance and Sexual Activity  Drug Use Yes   Types: Cocaine    Social History   Socioeconomic History   Marital status: Legally Separated    Spouse name: Not on file   Number of children: Not on file   Years of education: Not on file   Highest education level: Not on file  Occupational History   Not on file  Tobacco Use   Smoking status: Every Day    Types: Cigarettes   Smokeless tobacco: Never   Tobacco comments:    occasionally  Vaping Use   Vaping Use: Never used  Substance and Sexual Activity   Alcohol use: Not Currently   Drug use: Yes    Types: Cocaine   Sexual activity: Not on file  Other Topics Concern   Not on file  Social History Narrative   Not on file   Social Determinants of Health   Financial Resource Strain: Not on file  Food Insecurity: Not on file  Transportation Needs: Not on file  Physical Activity: Not on file  Stress: Not on file  Social Connections: Not on file   Additional Social History:                         Sleep: Fair  Appetite:  Fair  Current Medications: Current Facility-Administered Medications  Medication Dose Route Frequency Provider Last Rate Last Admin   acetaminophen (TYLENOL) tablet 650 mg  650  mg Oral Q6H PRN Lucky Rathke, FNP   650 mg at 08/20/21 0808   alum & mag hydroxide-simeth (MAALOX/MYLANTA) 200-200-20 MG/5ML suspension 30 mL  30 mL Oral Q4H PRN Lucky Rathke, FNP       buPROPion (WELLBUTRIN XL) 24 hr tablet 450 mg  450 mg Oral Daily Isreal Moline, Madie Reno, MD   450 mg at 08/20/21 0757   carvedilol (COREG) tablet 6.25 mg  6.25 mg Oral BID WC Aailyah Dunbar, Madie Reno, MD   6.25 mg at 08/20/21 1704   clopidogrel (PLAVIX) tablet 75 mg  75 mg Oral Daily Laiklynn Raczynski, Madie Reno, MD   75 mg at 08/20/21 0757   DULoxetine (CYMBALTA) DR capsule 90 mg  90 mg Oral Daily Tyleigh Mahn, Madie Reno, MD   90 mg at 08/20/21 0757   furosemide (LASIX) tablet 80 mg  80 mg Oral Daily Ellijah Leffel, Madie Reno, MD   80 mg at 08/20/21 0758   glycopyrrolate  (ROBINUL) injection 0.1 mg  0.1 mg Intravenous Once Sheretta Grumbine, Madie Reno, MD       glycopyrrolate (ROBINUL) injection 0.1 mg  0.1 mg Intravenous Once Tevita Gomer, Madie Reno, MD       glycopyrrolate (ROBINUL) injection 0.2 mg  0.2 mg Intravenous Once Antania Hoefling, Madie Reno, MD       hydrOXYzine (ATARAX) tablet 25 mg  25 mg Oral TID PRN Lucky Rathke, FNP   25 mg at 08/17/21 1452   ibuprofen (ADVIL) tablet 600 mg  600 mg Oral Q6H PRN Kabeer Hoagland, Madie Reno, MD       insulin aspart (novoLOG) injection 0-5 Units  0-5 Units Subcutaneous QHS Arelys Glassco T, MD   2 Units at 08/18/21 2119   insulin aspart (novoLOG) injection 0-9 Units  0-9 Units Subcutaneous TID WC Jourdan Maldonado, Madie Reno, MD   1 Units at 08/20/21 1705   insulin aspart protamine- aspart (NOVOLOG MIX 70/30) injection 60 Units  60 Units Subcutaneous TID PC Trysten Berti T, MD   60 Units at 08/20/21 1705   magnesium hydroxide (MILK OF MAGNESIA) suspension 30 mL  30 mL Oral Daily PRN Lucky Rathke, FNP       midazolam (VERSED) injection 2 mg  2 mg Intravenous Once Shanterria Franta T, MD       midazolam (VERSED) injection 2 mg  2 mg Intravenous Once Ashly Yepez, Madie Reno, MD       ondansetron [facility]) injection 4 mg  4 mg Intravenous Once PRN Arita Miss, MD       ondansetron [facility]) injection 4 mg  4 mg Intravenous Once PRN Arita Miss, MD       pantoprazole (PROTONIX) EC tablet 40 mg  40 mg Oral Daily Kyley Laurel T, MD   40 mg at 08/20/21 0757   prazosin (MINIPRESS) capsule 2 mg  2 mg Oral QHS Cerys Winget T, MD   2 mg at 08/19/21 2212   rosuvastatin (CRESTOR) tablet 20 mg  20 mg Oral Daily Catie Chiao, Madie Reno, MD   20 mg at 08/20/21 0758   traZODone (DESYREL) tablet 200 mg  200 mg Oral QHS Kamia Insalaco, Madie Reno, MD   200 mg at 08/19/21 2212    Lab Results:  Results for orders placed or performed during the hospital encounter of 08/06/21 (from the past 48 hour(s))  Glucose, capillary     Status: Abnormal   Collection Time: 08/19/21  6:46 AM  Result Value Ref Range   Glucose-Capillary  133 (H) 70 - 99 mg/dL  Comment: Glucose reference range applies only to samples taken after fasting for at least 8 hours.  Glucose, capillary     Status: Abnormal   Collection Time: 08/19/21  9:04 AM  Result Value Ref Range   Glucose-Capillary 186 (H) 70 - 99 mg/dL    Comment: Glucose reference range applies only to samples taken after fasting for at least 8 hours.  Glucose, capillary     Status: Abnormal   Collection Time: 08/19/21 11:27 AM  Result Value Ref Range   Glucose-Capillary 240 (H) 70 - 99 mg/dL    Comment: Glucose reference range applies only to samples taken after fasting for at least 8 hours.  Glucose, capillary     Status: Abnormal   Collection Time: 08/19/21  4:16 PM  Result Value Ref Range   Glucose-Capillary 208 (H) 70 - 99 mg/dL    Comment: Glucose reference range applies only to samples taken after fasting for at least 8 hours.  Glucose, capillary     Status: Abnormal   Collection Time: 08/19/21  8:21 PM  Result Value Ref Range   Glucose-Capillary 111 (H) 70 - 99 mg/dL    Comment: Glucose reference range applies only to samples taken after fasting for at least 8 hours.  Glucose, capillary     Status: Abnormal   Collection Time: 08/20/21  6:53 AM  Result Value Ref Range   Glucose-Capillary 142 (H) 70 - 99 mg/dL    Comment: Glucose reference range applies only to samples taken after fasting for at least 8 hours.   Comment 1 Notify RN   Glucose, capillary     Status: Abnormal   Collection Time: 08/20/21 11:40 AM  Result Value Ref Range   Glucose-Capillary 114 (H) 70 - 99 mg/dL    Comment: Glucose reference range applies only to samples taken after fasting for at least 8 hours.   Comment 1 Notify RN   Glucose, capillary     Status: Abnormal   Collection Time: 08/20/21  4:26 PM  Result Value Ref Range   Glucose-Capillary 128 (H) 70 - 99 mg/dL    Comment: Glucose reference range applies only to samples taken after fasting for at least 8 hours.    Blood Alcohol  level:  Lab Results  Component Value Date   ETH <10 08/05/2021   ETH <10 123456    Metabolic Disorder Labs: Lab Results  Component Value Date   HGBA1C 10.0 (H) 08/05/2021   MPG 240.3 08/05/2021   MPG 205.86 01/27/2021   No results found for: PROLACTIN Lab Results  Component Value Date   CHOL 143 08/05/2021   TRIG 245 (H) 08/05/2021   HDL 33 (L) 08/05/2021   CHOLHDL 4.3 08/05/2021   VLDL 49 (H) 08/05/2021   LDLCALC 61 08/05/2021   LDLCALC 71 01/27/2021    Physical Findings: AIMS:  , ,  ,  ,    CIWA:    COWS:     Musculoskeletal: Strength & Muscle Tone: within normal limits Gait & Station: normal Patient leans: N/A  Psychiatric Specialty Exam:  Presentation  General Appearance: Disheveled  Eye Contact:Minimal  Speech:Clear and Coherent; Normal Rate  Speech Volume:Normal  Handedness:Right   Mood and Affect  Mood:Irritable  Affect:Congruent; Depressed   Thought Process  Thought Processes:Coherent  Descriptions of Associations:Intact  Orientation:Full (Time, Place and Person)  Thought Content:Logical  History of Schizophrenia/Schizoaffective disorder:No  Duration of Psychotic Symptoms:No data recorded Hallucinations:No data recorded Ideas of Reference:None  Suicidal Thoughts:No data recorded Homicidal Thoughts:No  data recorded  Sensorium  Memory:Immediate Good; Recent Good; Remote Good  Judgment:Poor  Insight:Poor   Executive Functions  Concentration:Good  Attention Span:Good  [place]  Language:Good   Psychomotor Activity  Psychomotor Activity:No data recorded  Assets  Assets:Communication Skills; Desire for Improvement; Housing; Intimacy; Leisure Time; Physical Health; Resilience; Social Support   Sleep  Sleep:No data recorded   Physical Exam: Physical Exam Vitals and nursing note reviewed.  Constitutional:      Appearance: Normal appearance.  HENT:     Head: Normocephalic and  atraumatic.     Mouth/Throat:     Pharynx: Oropharynx is clear.  Eyes:     Pupils: Pupils are equal, round, and reactive to light.  Cardiovascular:     Rate and Rhythm: Normal rate and regular rhythm.  Pulmonary:     Effort: Pulmonary effort is normal.     Breath sounds: Normal breath sounds.  Abdominal:     General: Abdomen is flat.     Palpations: Abdomen is soft.  Musculoskeletal:        General: Normal range of motion.  Skin:    General: Skin is warm and dry.  Neurological:     General: No focal deficit present.     Mental Status: He is alert. Mental status is at baseline.  Psychiatric:        Attention and Perception: Attention normal.        Mood and Affect: Mood normal.        Speech: Speech normal.        Behavior: Behavior normal.        Thought Content: Thought content normal.        Cognition and Memory: Cognition normal.   Review of Systems  Constitutional: Negative.   HENT: Negative.    Eyes: Negative.   Respiratory: Negative.    Cardiovascular: Negative.   Gastrointestinal: Negative.   Musculoskeletal: Negative.   Skin: Negative.   Neurological: Negative.   Psychiatric/Behavioral: Negative.    Blood pressure (!) 144/77, pulse 84, temperature 97.7 F (36.5 C), temperature source Oral, resp. rate 17, height 5\' 5"  (1.651 m), weight 123.8 kg, SpO2 99 %. Body mass index is 45.42 kg/m.   Treatment Plan Summary: Medication management and Plan no change to medication.  Patient still asking to stay until Friday to make sure he has a safe place to return to.  Encouraged him to continue looking into other options.  No change to medicine.  Encourage attendance at groups and interactions on the unit.  Alethia Berthold, MD 08/20/2021, 6:02 PM

## 2021-08-21 DIAGNOSIS — F332 Major depressive disorder, recurrent severe without psychotic features: Secondary | ICD-10-CM | POA: Diagnosis not present

## 2021-08-21 LAB — GLUCOSE, CAPILLARY
Glucose-Capillary: 105 mg/dL — ABNORMAL HIGH (ref 70–99)
Glucose-Capillary: 110 mg/dL — ABNORMAL HIGH (ref 70–99)
Glucose-Capillary: 113 mg/dL — ABNORMAL HIGH (ref 70–99)

## 2021-08-21 MED ORDER — HYDROXYZINE HCL 25 MG PO TABS: 25.0000 mg | ORAL_TABLET | Freq: Three times a day (TID) | ORAL | 1 refills | Status: DC | PRN

## 2021-08-21 MED ORDER — ROSUVASTATIN CALCIUM 20 MG PO TABS: 20.0000 mg | ORAL_TABLET | Freq: Every day | ORAL | 1 refills | Status: DC

## 2021-08-21 MED ORDER — FUROSEMIDE 80 MG PO TABS: 80.0000 mg | ORAL_TABLET | Freq: Every day | ORAL | 1 refills | Status: DC

## 2021-08-21 MED ORDER — PRAZOSIN HCL 2 MG PO CAPS: 2.0000 mg | ORAL_CAPSULE | Freq: Every day | ORAL | 1 refills | Status: DC

## 2021-08-21 MED ORDER — BUPROPION HCL ER (XL) 450 MG PO TB24: 450.0000 mg | ORAL_TABLET | Freq: Every day | ORAL | 1 refills | Status: DC

## 2021-08-21 MED ORDER — TRULICITY 1.5 MG/0.5ML ~~LOC~~ SOAJ: 1.5000 mg | SUBCUTANEOUS | 4 refills | Status: DC

## 2021-08-21 MED ORDER — CLOPIDOGREL BISULFATE 75 MG PO TABS: 75.0000 mg | ORAL_TABLET | Freq: Every day | ORAL | 1 refills | Status: DC

## 2021-08-21 MED ORDER — DULOXETINE HCL 30 MG PO CPEP: 90.0000 mg | ORAL_CAPSULE | Freq: Every day | ORAL | 1 refills | Status: DC

## 2021-08-21 MED ORDER — INSULIN ASPART PROT & ASPART (70-30 MIX) 100 UNIT/ML ~~LOC~~ SUSP: 60.0000 [IU] | Freq: Three times a day (TID) | SUBCUTANEOUS | 11 refills | Status: DC

## 2021-08-21 MED ORDER — CARVEDILOL 6.25 MG PO TABS: 6.2500 mg | ORAL_TABLET | Freq: Two times a day (BID) | ORAL | 1 refills | Status: DC

## 2021-08-21 MED ORDER — PANTOPRAZOLE SODIUM 40 MG PO TBEC: 40.0000 mg | DELAYED_RELEASE_TABLET | Freq: Every day | ORAL | 1 refills | Status: DC

## 2021-08-21 MED ORDER — TRAZODONE HCL 100 MG PO TABS: 200.0000 mg | ORAL_TABLET | Freq: Every day | ORAL | 1 refills | Status: DC

## 2021-08-21 NOTE — Group Note (Signed)
[facility] LCSW Group Therapy Note   Group Date: 08/21/2021 Start Time: 1300 End Time: 1400  Type of Therapy/Topic:  Group Therapy:  Feelings about Diagnosis  Participation Level:  Active   Mood: euthymic    Description of Group:    This group will allow patients to explore their thoughts and feelings about diagnoses they have received. Patients will be guided to explore their level of understanding and acceptance of these diagnoses. Facilitator will encourage patients to process their thoughts and feelings about the reactions of others to their diagnosis, and will guide patients in identifying ways to discuss their diagnosis with significant others in their lives. This group will be process-oriented, with patients participating in exploration of their own experiences as well as giving and receiving support and challenge from other group members.   Therapeutic Goals: 1. Patient will demonstrate understanding of diagnosis as evidence by identifying two or more symptoms of the disorder:  2. Patient will be able to express two feelings regarding the diagnosis 3. Patient will demonstrate ability to communicate their needs through discussion and/or role plays  Summary of Patient Progress: Patient was present for the entirety of the group session. Patient was an active listener and participated in the topic of discussion, provided helpful advice to others, and added nuance to topic of conversation. Patient shared that in order to come to a dx, there must be evidence to support. Patient was particularly insightful regarding topic and helped others better understand concepts.     Therapeutic Modalities:   Cognitive Behavioral Therapy Brief Therapy Feelings Identification    Corky Crafts, [place]

## 2021-08-21 NOTE — Progress Notes (Signed)
Pt calm and pleasant during assessment denying SI/HI/AVH. Pt observed interacting appropriately with staff and peers on the unit. Pt compliant with medication administration per MD orders. Pt being monitored Q 15 minutes for safety per unit protocol. Pt remains safe on the unit.

## 2021-08-21 NOTE — Plan of Care (Signed)
D: Pt alert and oriented. Pt rates depression 0/10, hopelessness 0/10, and anxiety 0/10. Pt goal: "Self." Pt reports energy level as normal and concentration as being good. Pt reports sleep last night as being poor. Pt did receive medications for sleep and did find them helpful. Pt verbally denies experiencing any pain at this time. Pt denies experiencing any SI/HI, or AVH at this time.   Pt reports experiencing irritability and poor sleep r/t another pt who continued to press the call button all night, which rings through the unit.   A: Scheduled medications administered to pt, per MD orders. Support and encouragement provided. Frequent verbal contact made. Routine safety checks conducted q15 minutes.   R: No adverse drug reactions noted. Pt verbally contracts for safety at this time. Pt complaint with medications and treatment plan. Pt interacts well with others on the unit. Pt remains safe at this time. Will continue to monitor.   Problem: Education: Goal: Knowledge of [place] [facility] Education information/materials will improve Outcome: Progressing Goal: Emotional status will improve Outcome: Progressing Goal: Mental status will improve Outcome: Progressing

## 2021-08-21 NOTE — Progress Notes (Signed)
[facility] MD Progress Note  08/21/2021 3:01 PM Luis [name]  MRN:  [id number] Subjective: Follow-up for 51 year old man with depression.  Patient seen and chart reviewed.  Patient reports mood is much better.  It is staying good.  He is nervous about getting out of the hospital and reestablishing contact with his adult children but is more optimistic about it than previously.  Not reporting any suicidal ideation.  Energy level is stable. Principal Problem: MDD (major depressive disorder), recurrent severe, without psychosis ([place]) Diagnosis: Principal Problem:   MDD (major depressive disorder), recurrent severe, without psychosis ([place]) Active Problems:   Chronic systolic CHF (congestive heart failure) (HCC)   Pacemaker   Diabetes mellitus without complication (HCC)   Hypertension   GERD (gastroesophageal reflux disease)   Cocaine use disorder ([place])   Sleep apnea  Total Time spent with patient: 30 minutes  Past Psychiatric History: Past history of depression and substance abuse  Past Medical History:  Past Medical History:  Diagnosis Date   Anxiety    Biventricular ICD (implantable cardioverter-defibrillator) in place    Chronic systolic CHF (congestive heart failure) ([place])    Cocaine use    Diabetes mellitus without complication (HCC)    GERD (gastroesophageal reflux disease)    HLD (hyperlipidemia)    Hypertension    Morbid obesity (HCC)    NICM (nonischemic cardiomyopathy) (HCC)    OSA (obstructive sleep apnea)    PTSD (post-traumatic stress disorder)    on Depakote   Refusal of blood transfusions as patient is Jehovah's Witness    Tobacco abuse     Past Surgical History:  Procedure Laterality Date   BIV ICD INSERTION CRT-D     FOOT FRACTURE SURGERY     ROTATOR CUFF REPAIR     TONSILLECTOMY     Family History:  Family History  Problem Relation Age of Onset   Hypertension Mother    Bone cancer Father    Lung cancer Father    Family Psychiatric  History: See  previous Social History:  Social History   Substance and Sexual Activity  Alcohol Use Not Currently     Social History   Substance and Sexual Activity  Drug Use Yes   Types: Cocaine    Social History   Socioeconomic History   Marital status: Legally Separated    Spouse name: Not on file   Number of children: Not on file   Years of education: Not on file   Highest education level: Not on file  Occupational History   Not on file  Tobacco Use   Smoking status: Every Day    Types: Cigarettes   Smokeless tobacco: Never   Tobacco comments:    occasionally  Vaping Use   Vaping Use: Never used  Substance and Sexual Activity   Alcohol use: Not Currently   Drug use: Yes    Types: Cocaine   Sexual activity: Not on file  Other Topics Concern   Not on file  Social History Narrative   Not on file   Social Determinants of Health   Financial Resource Strain: Not on file  Food Insecurity: Not on file  Transportation Needs: Not on file  Physical Activity: Not on file  Stress: Not on file  Social Connections: Not on file   Additional Social History:                         Sleep: Fair  Appetite:  Fair  Current  Medications: Current Facility-Administered Medications  Medication Dose Route Frequency Provider Last Rate Last Admin   acetaminophen (TYLENOL) tablet 650 mg  650 mg Oral Q6H PRN Lenard Lance, FNP   650 mg at 08/21/21 0454   alum & mag hydroxide-simeth (MAALOX/MYLANTA) 200-200-20 MG/5ML suspension 30 mL  30 mL Oral Q4H PRN Lenard Lance, FNP       buPROPion (WELLBUTRIN XL) 24 hr tablet 450 mg  450 mg Oral Daily Dawnielle Christiana, [name], MD   450 mg at 08/21/21 0827   carvedilol (COREG) tablet 6.25 mg  6.25 mg Oral BID WC Kennidi Yoshida, [name], MD   6.25 mg at 08/21/21 0827   clopidogrel (PLAVIX) tablet 75 mg  75 mg Oral Daily Annsleigh Dragoo, [name], MD   75 mg at 08/21/21 9201   DULoxetine (CYMBALTA) DR capsule 90 mg  90 mg Oral Daily Lisaanne Lawrie, [name], MD   90 mg at 08/21/21  0071   furosemide (LASIX) tablet 80 mg  80 mg Oral Daily Janiel Derhammer, [name], MD   80 mg at 08/21/21 0827   glycopyrrolate (ROBINUL) injection 0.1 mg  0.1 mg Intravenous Once Destini Cambre, [name], MD       glycopyrrolate (ROBINUL) injection 0.1 mg  0.1 mg Intravenous Once Elisea Khader, [name], MD       glycopyrrolate (ROBINUL) injection 0.2 mg  0.2 mg Intravenous Once Jessy Calixte, [name], MD       hydrOXYzine (ATARAX) tablet 25 mg  25 mg Oral TID PRN Lenard Lance, FNP   25 mg at 08/17/21 1452   ibuprofen (ADVIL) tablet 600 mg  600 mg Oral Q6H PRN Shealynn Saulnier, [name], MD       insulin aspart (novoLOG) injection 0-5 Units  0-5 Units Subcutaneous QHS Ismail Graziani T, MD   2 Units at 08/18/21 2119   insulin aspart (novoLOG) injection 0-9 Units  0-9 Units Subcutaneous TID WC Germain Koopmann, [name], MD   1 Units at 08/20/21 1705   insulin aspart protamine- aspart (NOVOLOG MIX 70/30) injection 60 Units  60 Units Subcutaneous TID PC Gisela Lea T, MD   60 Units at 08/21/21 1219   magnesium hydroxide (MILK OF MAGNESIA) suspension 30 mL  30 mL Oral Daily PRN Lenard Lance, FNP       midazolam (VERSED) injection 2 mg  2 mg Intravenous Once Sakiyah Shur T, MD       midazolam (VERSED) injection 2 mg  2 mg Intravenous Once Ettore Trebilcock, [name], MD       ondansetron [facility]) injection 4 mg  4 mg Intravenous Once PRN [name], MD       ondansetron [facility]) injection 4 mg  4 mg Intravenous Once PRN [name], MD       pantoprazole (PROTONIX) EC tablet 40 mg  40 mg Oral Daily Earvin Blazier, [name], MD   40 mg at 08/21/21 2197   prazosin (MINIPRESS) capsule 2 mg  2 mg Oral QHS Leemon Ayala T, MD   2 mg at 08/20/21 2108   rosuvastatin (CRESTOR) tablet 20 mg  20 mg Oral Daily Partick Musselman, [name], MD   20 mg at 08/21/21 0827   traZODone (DESYREL) tablet 200 mg  200 mg Oral QHS Miyonna Ormiston, [name], MD   200 mg at 08/20/21 2108    Lab Results:  Results for orders placed or performed during the hospital encounter of 08/06/21 (from the past 48 hour(s))   Glucose, capillary     Status: Abnormal  Collection Time: 08/19/21  4:16 PM  Result Value Ref Range   Glucose-Capillary 208 (H) 70 - 99 mg/dL    Comment: Glucose reference range applies only to samples taken after fasting for at least 8 hours.  Glucose, capillary     Status: Abnormal   Collection Time: 08/19/21  8:21 PM  Result Value Ref Range   Glucose-Capillary 111 (H) 70 - 99 mg/dL    Comment: Glucose reference range applies only to samples taken after fasting for at least 8 hours.  Glucose, capillary     Status: Abnormal   Collection Time: 08/20/21  6:53 AM  Result Value Ref Range   Glucose-Capillary 142 (H) 70 - 99 mg/dL    Comment: Glucose reference range applies only to samples taken after fasting for at least 8 hours.   Comment 1 Notify RN   Glucose, capillary     Status: Abnormal   Collection Time: 08/20/21 11:40 AM  Result Value Ref Range   Glucose-Capillary 114 (H) 70 - 99 mg/dL    Comment: Glucose reference range applies only to samples taken after fasting for at least 8 hours.   Comment 1 Notify RN   Glucose, capillary     Status: Abnormal   Collection Time: 08/20/21  4:26 PM  Result Value Ref Range   Glucose-Capillary 128 (H) 70 - 99 mg/dL    Comment: Glucose reference range applies only to samples taken after fasting for at least 8 hours.  Glucose, capillary     Status: Abnormal   Collection Time: 08/20/21  7:49 PM  Result Value Ref Range   Glucose-Capillary 105 (H) 70 - 99 mg/dL    Comment: Glucose reference range applies only to samples taken after fasting for at least 8 hours.   Comment 1 Notify RN   Glucose, capillary     Status: Abnormal   Collection Time: 08/21/21  6:56 AM  Result Value Ref Range   Glucose-Capillary 110 (H) 70 - 99 mg/dL    Comment: Glucose reference range applies only to samples taken after fasting for at least 8 hours.   Comment 1 Notify RN   Glucose, capillary     Status: Abnormal   Collection Time: 08/21/21 11:13 AM  Result Value  Ref Range   Glucose-Capillary 113 (H) 70 - 99 mg/dL    Comment: Glucose reference range applies only to samples taken after fasting for at least 8 hours.    Blood Alcohol level:  Lab Results  Component Value Date   ETH <10 08/05/2021   ETH <10 123456    Metabolic Disorder Labs: Lab Results  Component Value Date   HGBA1C 10.0 (H) 08/05/2021   MPG 240.3 08/05/2021   MPG 205.86 01/27/2021   No results found for: PROLACTIN Lab Results  Component Value Date   CHOL 143 08/05/2021   TRIG 245 (H) 08/05/2021   HDL 33 (L) 08/05/2021   CHOLHDL 4.3 08/05/2021   VLDL 49 (H) 08/05/2021   LDLCALC 61 08/05/2021   LDLCALC 71 01/27/2021    Physical Findings: AIMS:  , ,  ,  ,    CIWA:    COWS:     Musculoskeletal: Strength & Muscle Tone: within normal limits Gait & Station: normal Patient leans: N/A  Psychiatric Specialty Exam:  Presentation  General Appearance: Disheveled  Eye Contact:Minimal  Speech:Clear and Coherent; Normal Rate  Speech Volume:Normal  Handedness:Right   Mood and Affect  Mood:Irritable  Affect:Congruent; Depressed   Thought Process  Thought Processes:Coherent  Descriptions of Associations:Intact  Orientation:Full (Time, Place and Person)  Thought Content:Logical  History of Schizophrenia/Schizoaffective disorder:No  Duration of Psychotic Symptoms:No data recorded Hallucinations:No data recorded Ideas of Reference:None  Suicidal Thoughts:No data recorded Homicidal Thoughts:No data recorded  Sensorium  Memory:Immediate Good; Recent Good; Remote Good  Judgment:Poor  Insight:Poor   Executive Functions  Concentration:Good  Attention Span:Good  [place] of Knowledge:Good  Language:Good   Psychomotor Activity  Psychomotor Activity:No data recorded  Assets  Assets:Communication Skills; Desire for Improvement; Housing; Intimacy; Leisure Time; Physical Health; Resilience; Social Support   Sleep  Sleep:No  data recorded   Physical Exam: Physical Exam Vitals and nursing note reviewed.  Constitutional:      Appearance: Normal appearance.  HENT:     Head: Normocephalic and atraumatic.     Mouth/Throat:     Pharynx: Oropharynx is clear.  Eyes:     Pupils: Pupils are equal, round, and reactive to light.  Cardiovascular:     Rate and Rhythm: Normal rate and regular rhythm.  Pulmonary:     Effort: Pulmonary effort is normal.     Breath sounds: Normal breath sounds.  Abdominal:     General: Abdomen is flat.     Palpations: Abdomen is soft.  Musculoskeletal:        General: Normal range of motion.  Skin:    General: Skin is warm and dry.  Neurological:     General: No focal deficit present.     Mental Status: He is alert. Mental status is at baseline.  Psychiatric:        Mood and Affect: Mood normal.        Thought Content: Thought content normal.   Review of Systems  Constitutional: Negative.   HENT: Negative.    Eyes: Negative.   Respiratory: Negative.    Cardiovascular: Negative.   Gastrointestinal: Negative.   Musculoskeletal: Negative.   Skin: Negative.   Neurological: Negative.   Psychiatric/Behavioral: Negative.    Blood pressure 131/68, pulse 79, temperature 98.7 F (37.1 C), temperature source Oral, resp. rate 18, height 5\' 5"  (1.651 m), weight 123.8 kg, SpO2 99 %. Body mass index is 45.42 kg/m.   Treatment Plan Summary: Medication management and Plan no change to medicine.  Reviewed all medicines with patient.  We are planning for discharge tomorrow morning.  He is agreeable to the plan.  He will follow up with mental health providers outside the hospital.  I am going to go ahead and start making arrangements to confirm the discharge.  Alethia Berthold, MD 08/21/2021, 3:01 PM

## 2021-08-21 NOTE — Progress Notes (Signed)
Recreation Therapy Notes ° °Date: 08/21/2021 ° °Time: 10:30 am   ° °Location: Courtyard    ° °Behavioral response: N/A °  °Intervention Topic: Social Skills   ° °Discussion/Intervention: °Patient refused to attend group.  ° °Clinical Observations/Feedback:  °Patient refused to attend group.  °  °Luis [name] LRT/CTRS ° ° ° ° ° ° ° °Luis [name] °08/21/2021 12:16 PM °

## 2021-08-21 NOTE — BHH Suicide Risk Assessment (Signed)
[facility] Discharge Suicide Risk Assessment   Principal Problem: MDD (major depressive disorder), recurrent severe, without psychosis ([name]) Discharge Diagnoses: Principal Problem:   MDD (major depressive disorder), recurrent severe, without psychosis ([name]) Active Problems:   Chronic systolic CHF (congestive heart failure) (HCC)   Pacemaker   Diabetes mellitus without complication (HCC)   Hypertension   GERD (gastroesophageal reflux disease)   Cocaine use disorder ([place])   Sleep apnea   Total Time spent with patient: 30 minutes  Musculoskeletal: Strength & Muscle Tone: within normal limits Gait & Station: normal Patient leans: N/A  Psychiatric Specialty Exam  Presentation  General Appearance: Disheveled  Eye Contact:Minimal  Speech:Clear and Coherent; Normal Rate  Speech Volume:Normal  Handedness:Right   Mood and Affect  Mood:Irritable  Duration of Depression Symptoms: Greater than two weeks  Affect:Congruent; Depressed   Thought Process  Thought Processes:Coherent  Descriptions of Associations:Intact  Orientation:Full (Time, Place and Person)  Thought Content:Logical  History of Schizophrenia/Schizoaffective disorder:No  Duration of Psychotic Symptoms:No data recorded Hallucinations:No data recorded Ideas of Reference:None  Suicidal Thoughts:No data recorded Homicidal Thoughts:No data recorded  Sensorium  Memory:Immediate Good; Recent Good; Remote Good  Judgment:Poor  Insight:Poor   Executive Functions  Concentration:Good  Attention Span:Good  [place] of Knowledge:Good  Language:Good   Psychomotor Activity  Psychomotor Activity:No data recorded  Assets  Assets:Communication Skills; Desire for Improvement; Housing; Intimacy; Leisure Time; Physical Health; Resilience; Social Support   Sleep  Sleep:No data recorded  Physical Exam: Physical Exam Vitals and nursing note reviewed.  Constitutional:      Appearance: Normal  appearance.  HENT:     Head: Normocephalic and atraumatic.     Mouth/Throat:     Pharynx: Oropharynx is clear.  Eyes:     Pupils: Pupils are equal, round, and reactive to light.  Cardiovascular:     Rate and Rhythm: Normal rate and regular rhythm.  Pulmonary:     Effort: Pulmonary effort is normal.     Breath sounds: Normal breath sounds.  Abdominal:     General: Abdomen is flat.     Palpations: Abdomen is soft.  Musculoskeletal:        General: Normal range of motion.  Skin:    General: Skin is warm and dry.  Neurological:     General: No focal deficit present.     Mental Status: He is alert. Mental status is at baseline.  Psychiatric:        Mood and Affect: Mood normal.        Thought Content: Thought content normal.   Review of Systems  Constitutional: Negative.   HENT: Negative.    Eyes: Negative.   Respiratory: Negative.    Cardiovascular: Negative.   Gastrointestinal: Negative.   Musculoskeletal: Negative.   Skin: Negative.   Neurological: Negative.   Psychiatric/Behavioral: Negative.    Blood pressure 131/68, pulse 79, temperature 98.7 F (37.1 C), temperature source Oral, resp. rate 18, height 5\' 5"  (1.651 m), weight 123.8 kg, SpO2 99 %. Body mass index is 45.42 kg/m.  Mental Status Per Nursing Assessment::   On Admission:  NA  Demographic Factors:  Male and Living alone  Loss Factors: Financial problems/change in socioeconomic status  Historical Factors: Anniversary of important loss  Risk Reduction Factors:   Positive therapeutic relationship  Continued Clinical Symptoms:  Depression:   Comorbid alcohol abuse/dependence Alcohol/Substance Abuse/Dependencies  Cognitive Features That Contribute To Risk:  None    Suicide Risk:  Minimal: No identifiable suicidal ideation.  Patients presenting  with no risk factors but with morbid ruminations; may be classified as minimal risk based on the severity of the depressive symptoms    Plan Of  Care/Follow-up recommendations:  Continue current medication.  Refer to outpatient mental health treatment for substance abuse and treatment of depression.  Reviewed with patient the medicine usage of the medical problems and the coping skills he is working on.  Patient is denying suicidal ideation and appears overall optimistic about his future plan  Luis Berthold, MD 08/21/2021, 3:04 PM

## 2021-08-22 LAB — GLUCOSE, CAPILLARY
Glucose-Capillary: 115 mg/dL — ABNORMAL HIGH (ref 70–99)
Glucose-Capillary: 195 mg/dL — ABNORMAL HIGH (ref 70–99)
Glucose-Capillary: 145 mg/dL — ABNORMAL HIGH (ref 70–99)
Glucose-Capillary: 122 mg/dL — ABNORMAL HIGH (ref 70–99)

## 2021-08-22 NOTE — Care Management Important Message (Signed)
Important Message  Patient Details  Name: Luis [name] MRN: [id number] Date of Birth: 06-11-1971   Medicare Important Message Given:  Yes  Patient informed of right to appeal discharge, provided phone number to [facility]. Patient expressed no interest in appealing discharge at this time. CSW will continue to monitor situation.   Corky Crafts, LCSWA 08/22/2021, 11:01 AM

## 2021-08-22 NOTE — Discharge Summary (Signed)
Physician Discharge Summary Note  Patient:  Luis [name] is an 51 y.o., male MRN:  [id number] DOB:  1970/09/22 Patient phone:  [phone] (home)  Patient address:   [address] [place] [address],  Total Time spent with patient: 30 minutes  Date of Admission:  08/06/2021 Date of Discharge: 08/22/2021  Reason for Admission: Admitted with suicidal ideation and depression severe withdrawal poor self-care.  Principal Problem: MDD (major depressive disorder), recurrent severe, without psychosis ([place]) Discharge Diagnoses: Principal Problem:   MDD (major depressive disorder), recurrent severe, without psychosis ([place]) Active Problems:   Chronic systolic CHF (congestive heart failure) (HCC)   Pacemaker   Diabetes mellitus without complication (HCC)   Hypertension   GERD (gastroesophageal reflux disease)   Cocaine use disorder (HCC)   Sleep apnea   Past Psychiatric History: Past history of depression multiple medical problems history of substance abuse  Past Medical History:  Past Medical History:  Diagnosis Date   Anxiety    Biventricular ICD (implantable cardioverter-defibrillator) in place    Chronic systolic CHF (congestive heart failure) ([place])    Cocaine use    Diabetes mellitus without complication (HCC)    GERD (gastroesophageal reflux disease)    HLD (hyperlipidemia)    Hypertension    Morbid obesity (HCC)    NICM (nonischemic cardiomyopathy) (HCC)    OSA (obstructive sleep apnea)    PTSD (post-traumatic stress disorder)    on Depakote   Refusal of blood transfusions as patient is Jehovah's Witness    Tobacco abuse     Past Surgical History:  Procedure Laterality Date   BIV ICD INSERTION CRT-D     FOOT FRACTURE SURGERY     ROTATOR CUFF REPAIR     TONSILLECTOMY     Family History:  Family History  Problem Relation Age of Onset   Hypertension Mother    Bone cancer Father    Lung cancer Father    Family Psychiatric  History: See  previous Social History:  Social History   Substance and Sexual Activity  Alcohol Use Not Currently     Social History   Substance and Sexual Activity  Drug Use Yes   Types: Cocaine    Social History   Socioeconomic History   Marital status: Legally Separated    Spouse name: Not on file   Number of children: Not on file   Years of education: Not on file   Highest education level: Not on file  Occupational History   Not on file  Tobacco Use   Smoking status: Every Day    Types: Cigarettes   Smokeless tobacco: Never   Tobacco comments:    occasionally  Vaping Use   Vaping Use: Never used  Substance and Sexual Activity   Alcohol use: Not Currently   Drug use: Yes    Types: Cocaine   Sexual activity: Not on file  Other Topics Concern   Not on file  Social History Narrative   Not on file   Social Determinants of Health   Financial Resource Strain: Not on file  Food Insecurity: Not on file  Transportation Needs: Not on file  Physical Activity: Not on file  Stress: Not on file  Social Connections: Not on file    Hospital Course: Admitted to the psychiatric ward.  Patient did not display any dangerous aggressive or suicidal behaviors while on the unit.  He was cooperative with treatment.  Presented as very sad depressed with passive suicidal ideation and hopelessness.  Patient reviewed how medications had been of minimal benefit to him in the past.  We discussed electroconvulsive therapy and the patient was agreeable to a trial of ECT based on severe major depression with poor self-care hopelessness suicidal thoughts and failure to respond to medication.  He had 4 ECT treatments while he was in the hospital.  All of them were tolerated well.  Patient expressed improvement in his mood even with the first couple treatments.  By the end however his seizures had become so short to even with maximum stimulation and alternative anesthesia that there was no likelihood of clinical  benefit from continuing treatment.  Medication was continued.  Patient participated appropriately in groups and activities and social activities on the unit.  At the time of discharge he has made arrangements that he will be able to return to his apartment.  Medications prescribed.  Diabetes and blood pressure under adequate control.  Patient strongly encouraged to continue follow-up with local mental health agencies, do not get back into any drug use, supportive therapy completed.  He agrees to the plan  Physical Findings: AIMS:  , ,  ,  ,    CIWA:    COWS:     Musculoskeletal: Strength & Muscle Tone: within normal limits Gait & Station: normal Patient leans: N/A   Psychiatric Specialty Exam:  Presentation  General Appearance: Disheveled  Eye Contact:Minimal  Speech:Clear and Coherent; Normal Rate  Speech Volume:Normal  Handedness:Right   Mood and Affect  Mood:Irritable  Affect:Congruent; Depressed   Thought Process  Thought Processes:Coherent  Descriptions of Associations:Intact  Orientation:Full (Time, Place and Person)  Thought Content:Logical  History of Schizophrenia/Schizoaffective disorder:No  Duration of Psychotic Symptoms:No data recorded Hallucinations:No data recorded Ideas of Reference:None  Suicidal Thoughts:No data recorded Homicidal Thoughts:No data recorded  Sensorium  Memory:Immediate Good; Recent Good; Remote Good  Judgment:Poor  Insight:Poor   Executive Functions  Concentration:Good  Attention Span:Good  [place] of Knowledge:Good  Language:Good   Psychomotor Activity  Psychomotor Activity:No data recorded  Assets  Assets:Communication Skills; Desire for Improvement; Housing; Intimacy; Leisure Time; Physical Health; Resilience; Social Support   Sleep  Sleep:No data recorded   Physical Exam: Physical Exam Vitals and nursing note reviewed.  Constitutional:      Appearance: Normal appearance.  HENT:      Head: Normocephalic and atraumatic.     Mouth/Throat:     Pharynx: Oropharynx is clear.  Eyes:     Pupils: Pupils are equal, round, and reactive to light.  Cardiovascular:     Rate and Rhythm: Normal rate and regular rhythm.  Pulmonary:     Effort: Pulmonary effort is normal.     Breath sounds: Normal breath sounds.  Abdominal:     General: Abdomen is flat.     Palpations: Abdomen is soft.  Musculoskeletal:        General: Normal range of motion.  Skin:    General: Skin is warm and dry.  Neurological:     General: No focal deficit present.     Mental Status: He is alert. Mental status is at baseline.  Psychiatric:        Mood and Affect: Mood normal.        Thought Content: Thought content normal.   Review of Systems  Constitutional: Negative.   HENT: Negative.    Eyes: Negative.   Respiratory: Negative.    Cardiovascular: Negative.   Gastrointestinal: Negative.   Musculoskeletal: Negative.   Skin: Negative.   Neurological: Negative.  Psychiatric/Behavioral: Negative.    Blood pressure (!) 142/88, pulse 79, temperature (!) 97.5 F (36.4 C), temperature source Oral, resp. rate 17, height 5\' 5"  (1.651 m), weight 123.8 kg, SpO2 96 %. Body mass index is 45.42 kg/m.   Social History   Tobacco Use  Smoking Status Every Day   Types: Cigarettes  Smokeless Tobacco Never  Tobacco Comments   occasionally   Tobacco Cessation:  A prescription for an FDA-approved tobacco cessation medication was offered at discharge and the patient refused   Blood Alcohol level:  Lab Results  Component Value Date   [facility] <10 08/05/2021   ETH <10 01/27/2021    Metabolic Disorder Labs:  Lab Results  Component Value Date   HGBA1C 10.0 (H) 08/05/2021   MPG 240.3 08/05/2021   MPG 205.86 01/27/2021   No results found for: PROLACTIN Lab Results  Component Value Date   CHOL 143 08/05/2021   TRIG 245 (H) 08/05/2021   HDL 33 (L) 08/05/2021   CHOLHDL 4.3 08/05/2021   VLDL 49 (H)  08/05/2021   LDLCALC 61 08/05/2021   LDLCALC 71 01/27/2021    See Psychiatric Specialty Exam and Suicide Risk Assessment completed by Attending Physician prior to discharge.  Discharge destination:  Home  Is patient on multiple antipsychotic therapies at discharge:  No   Has Patient had three or more failed trials of antipsychotic monotherapy by history:  No  Recommended Plan for Multiple Antipsychotic Therapies: NA  Discharge Instructions     Diet - low sodium heart healthy   Complete by: As directed    Increase activity slowly   Complete by: As directed       Allergies as of 08/22/2021       Reactions   Aspirin Anaphylaxis, Other (See Comments)   Throat closing   Iodine-131 Hives   Iodinated Contrast Media Hives   Latex Hives, Rash   Penicillins Nausea And Vomiting   Tramadol Hives, Nausea Only   Amoxicillin-pot Clavulanate Diarrhea   Lidocaine Rash   Lisinopril Cough        Medication List     STOP taking these medications    clotrimazole-betamethasone cream Commonly known as: LOTRISONE   divalproex 500 MG DR tablet Commonly known as: DEPAKOTE   HumaLOG Mix 75/25 KwikPen (75-25) 100 UNIT/ML Kwikpen Generic drug: Insulin Lispro Prot & Lispro   MELATONIN GUMMIES PO   multivitamin with minerals Tabs tablet   oxyCODONE-acetaminophen 7.5-325 MG tablet Commonly known as: PERCOCET   ramelteon 8 MG tablet Commonly known as: ROZEREM   sertraline 100 MG tablet Commonly known as: ZOLOFT   TEARS NATURALE OP   Victoza 18 MG/3ML Sopn Generic drug: liraglutide       TAKE these medications      Indication  buPROPion HCl ER (XL) 450 MG Tb24 Take 450 mg by mouth daily. What changed:  medication strength how much to take when to take this  Indication: Major Depressive Disorder   carvedilol 6.25 MG tablet Commonly known as: COREG Take 1 tablet (6.25 mg total) by mouth 2 (two) times daily with a meal. What changed: when to take this  Indication:  Cardiac Failure   clopidogrel 75 MG tablet Commonly known as: PLAVIX Take 1 tablet (75 mg total) by mouth daily.  Indication: Ischemic Heart Disease   DULoxetine 30 MG capsule Commonly known as: CYMBALTA Take 3 capsules (90 mg total) by mouth daily.  Indication: Major Depressive Disorder   furosemide 80 MG tablet Commonly known as: LASIX  Take 1 tablet (80 mg total) by mouth daily.  Indication: Cardiac Failure   hydrOXYzine 25 MG tablet Commonly known as: ATARAX Take 1 tablet (25 mg total) by mouth 3 (three) times daily as needed for anxiety.  Indication: Feeling Anxious   insulin aspart protamine- aspart (70-30) 100 UNIT/ML injection Commonly known as: NOVOLOG MIX 70/30 Inject 0.6 mLs (60 Units total) into the skin 3 (three) times daily after meals.  Indication: Type 2 Diabetes   pantoprazole 40 MG tablet Commonly known as: PROTONIX Take 1 tablet (40 mg total) by mouth daily.  Indication: Gastroesophageal Reflux Disease   prazosin 2 MG capsule Commonly known as: MINIPRESS Take 1 capsule (2 mg total) by mouth at bedtime.  Indication: High Blood Pressure Disorder, Frightening Dreams   rosuvastatin 20 MG tablet Commonly known as: CRESTOR Take 1 tablet (20 mg total) by mouth daily. What changed: when to take this  Indication: High Amount of Fats in the Blood   traZODone 100 MG tablet Commonly known as: DESYREL Take 2 tablets (200 mg total) by mouth at bedtime.  Indication: Trouble Sleeping   Trulicity 1.5 0000000 Sopn Generic drug: Dulaglutide Inject 1.5 mg into the skin once a week.  Indication: Type 2 Diabetes         Follow-up recommendations: Follow-up local mental health agencies and primary care doctor.  Continue medication management.  Involve yourself and appropriate substance abuse treatment  Comments: Prescriptions provided he agrees to plan  Signed: Alethia Berthold, MD 08/22/2021, 10:41 AM

## 2021-08-22 NOTE — Progress Notes (Signed)
°  [facility] Adult Case Management Discharge Plan :  Will you be returning to the same living situation after discharge:  Yes,  Patient to return to place of residence at discharge.  At discharge, do you have transportation home?: Yes,  CSW to assist with transportation. Patient to go by [employer]  Patient has signed the [facility] transportation waiver which was emailed to transportation office.  Do you have the ability to pay for your medications: Yes,  [employer].  Patient informed of right to appeal discharge, provided phone number to [facility]. Patient expressed no interest in appealing discharge at this time. CSW will continue to monitor situation.  Release of information consent forms completed and in the chart;  Patient's signature needed at discharge.  Patient to Follow up at:  Follow-up Information     [facility]. Go on 09/03/2021.   Specialty: Behavioral Health Why: Please present for scheduled appoitnment on 3/8 at 1300 VIRTUALLY. Do not present to the office for this appointment. Contact information: [address] [place] [address] [phone]               Next level of care provider has access to [facility] Link:yes  Safety Planning and Suicide Prevention discussed: Yes,  SPE completed with patient and [name], friend.    Has patient been referred to the Quitline?: Patient refused referral  Patient has been referred for addiction treatment: Yes Referred to [facility] outpatient.   Corky Crafts, LCSWA 08/22/2021, 10:52 AM

## 2021-08-22 NOTE — Progress Notes (Signed)
Recreation Therapy Notes    Date: 08/22/2021  Time: 10:10 am    Location: Courtyard     Behavioral response: N/A   Intervention Topic: Relaxation   Discussion/Intervention: Patient refused to attend group.   Clinical Observations/Feedback:  Patient refused to attend group.    Briston Lax LRT/CTRS        Jadarius Commons 08/22/2021 11:55 AM

## 2021-08-22 NOTE — Progress Notes (Signed)
Patient appropriate with staff & peers. Denies SI,HI and AVH. Verbalized understanding discharge instructions,follow up care and prescriptions. All belongings returned from [employer]. Patient escorted out by staff & transported by cab.

## 2021-09-03 ENCOUNTER — Telehealth (INDEPENDENT_AMBULATORY_CARE_PROVIDER_SITE_OTHER): Payer: Medicare Other | Admitting: Physician Assistant

## 2021-09-03 ENCOUNTER — Encounter (HOSPITAL_COMMUNITY): Payer: Self-pay | Admitting: Physician Assistant

## 2021-09-03 DIAGNOSIS — F331 Major depressive disorder, recurrent, moderate: Secondary | ICD-10-CM | POA: Diagnosis not present

## 2021-09-03 DIAGNOSIS — G4700 Insomnia, unspecified: Secondary | ICD-10-CM

## 2021-09-03 DIAGNOSIS — F411 Generalized anxiety disorder: Secondary | ICD-10-CM

## 2021-09-03 MED ORDER — PRAZOSIN HCL 2 MG PO CAPS: 2.0000 mg | ORAL_CAPSULE | Freq: Every day | ORAL | 1 refills | Status: DC

## 2021-09-03 MED ORDER — DULOXETINE HCL 30 MG PO CPEP: 90.0000 mg | ORAL_CAPSULE | Freq: Every day | ORAL | 1 refills | Status: DC

## 2021-09-03 MED ORDER — BUPROPION HCL ER (XL) 450 MG PO TB24: 450.0000 mg | ORAL_TABLET | Freq: Every day | ORAL | 1 refills | Status: DC

## 2021-09-03 MED ORDER — TRAZODONE HCL 100 MG PO TABS: 200.0000 mg | ORAL_TABLET | Freq: Every day | ORAL | 1 refills | Status: DC

## 2021-09-03 MED ORDER — HYDROXYZINE HCL 25 MG PO TABS: 25.0000 mg | ORAL_TABLET | Freq: Three times a day (TID) | ORAL | 1 refills | Status: DC | PRN

## 2021-09-03 NOTE — Progress Notes (Cosign Needed)
BH MD/PA/NP OP Progress Note  Virtual Visit via Telephone Note  I connected with Luis [name] on 09/03/21 at  1:00 PM EST by telephone and verified that I am speaking with the correct person using two identifiers.  Location: Patient: Home Provider: Clinic   I discussed the limitations, risks, security and privacy concerns of performing an evaluation and management service by telephone and the availability of in person appointments. I also discussed with the patient that there may be a patient responsible charge related to this service. The patient expressed understanding and agreed to proceed.  Follow Up Instructions:  I discussed the assessment and treatment plan with the patient. The patient was provided an opportunity to ask questions and all were answered. The patient agreed with the plan and demonstrated an understanding of the instructions.   The patient was advised to call back or seek an in-person evaluation if the symptoms worsen or if the condition fails to improve as anticipated.  I provided 16 minutes of non-face-to-face time during this encounter.  Luis Hatchet, PA   09/03/2021 8:13 PM Luis [name]  MRN:  [id number]  Chief Complaint:  Chief Complaint  Patient presents with   Follow-up   HPI:   Luis [name]  Visit Diagnosis:    ICD-10-CM   1. Moderate episode of recurrent major depressive disorder (HCC)  F33.1 buPROPion HCl ER, XL, 450 MG TB24    DULoxetine (CYMBALTA) 30 MG capsule    2. Insomnia, unspecified type  G47.00 traZODone (DESYREL) 100 MG tablet    prazosin (MINIPRESS) 2 MG capsule    3. Generalized anxiety disorder  F41.1 hydrOXYzine (ATARAX) 25 MG tablet    DULoxetine (CYMBALTA) 30 MG capsule      Past Psychiatric History:  Insomnia Major depressive disorder Unspecified mood (affective) disorder Generalized anxiety disorder  Past Medical History:  Past Medical History:  Diagnosis Date   Anxiety     Biventricular ICD (implantable cardioverter-defibrillator) in place    Chronic systolic CHF (congestive heart failure) (HCC)    Cocaine use    Diabetes mellitus without complication (HCC)    GERD (gastroesophageal reflux disease)    HLD (hyperlipidemia)    Hypertension    Morbid obesity (HCC)    NICM (nonischemic cardiomyopathy) (HCC)    OSA (obstructive sleep apnea)    PTSD (post-traumatic stress disorder)    on Depakote   Refusal of blood transfusions as patient is Jehovah's Witness    Tobacco abuse     Past Surgical History:  Procedure Laterality Date   BIV ICD INSERTION CRT-D     FOOT FRACTURE SURGERY     ROTATOR CUFF REPAIR     TONSILLECTOMY      Family Psychiatric History:  Patient is unsure of family history of psychiatric history   Patient reports that his father did three tours in [place]. Patient reports no support from family.  Family History:  Family History  Problem Relation Age of Onset   Hypertension Mother    Bone cancer Father    Lung cancer Father     Social History:  Social History   Socioeconomic History   Marital status: Legally Separated    Spouse name: Not on file   Number of children: Not on file   Years of education: Not on file   Highest education level: Not on file  Occupational History   Not on file  Tobacco Use   Smoking status: Every Day    Types: Cigarettes  Smokeless tobacco: Never   Tobacco comments:    occasionally  Vaping Use   Vaping Use: Never used  Substance and Sexual Activity   Alcohol use: Not Currently   Drug use: Yes    Types: Cocaine   Sexual activity: Not on file  Other Topics Concern   Not on file  Social History Narrative   Not on file   Social Determinants of Health   Financial Resource Strain: Not on file  Food Insecurity: Not on file  Transportation Needs: Not on file  Physical Activity: Not on file  Stress: Not on file  Social Connections: Not on file    Allergies:  Allergies  Allergen  Reactions   Aspirin Anaphylaxis and Other (See Comments)    Throat closing    Iodine-131 Hives   Iodinated Contrast Media Hives   Latex Hives and Rash   Penicillins Nausea And Vomiting   Tramadol Hives and Nausea Only   Amoxicillin-Pot Clavulanate Diarrhea   Lidocaine Rash   Lisinopril Cough    Metabolic Disorder Labs: Lab Results  Component Value Date   HGBA1C 10.0 (H) 08/05/2021   MPG 240.3 08/05/2021   MPG 205.86 01/27/2021   No results found for: PROLACTIN Lab Results  Component Value Date   CHOL 143 08/05/2021   TRIG 245 (H) 08/05/2021   HDL 33 (L) 08/05/2021   CHOLHDL 4.3 08/05/2021   VLDL 49 (H) 08/05/2021   LDLCALC 61 08/05/2021   LDLCALC 71 01/27/2021   Lab Results  Component Value Date   TSH 0.550 08/05/2021   TSH 0.412 09/30/2020    Therapeutic Level Labs: No results found for: LITHIUM Lab Results  Component Value Date   VALPROATE <10 (L) 08/05/2021   VALPROATE 18 (L) 11/15/2020   No components found for:  CBMZ  Current Medications: Current Outpatient Medications  Medication Sig Dispense Refill   buPROPion HCl ER, XL, 450 MG TB24 Take 450 mg by mouth daily. 30 tablet 1   carvedilol (COREG) 6.25 MG tablet Take 1 tablet (6.25 mg total) by mouth 2 (two) times daily with a meal. 60 tablet 1   clopidogrel (PLAVIX) 75 MG tablet Take 1 tablet (75 mg total) by mouth daily. 30 tablet 1   DULoxetine (CYMBALTA) 30 MG capsule Take 3 capsules (90 mg total) by mouth daily. 90 capsule 1   furosemide (LASIX) 80 MG tablet Take 1 tablet (80 mg total) by mouth daily. 30 tablet 1   hydrOXYzine (ATARAX) 25 MG tablet Take 1 tablet (25 mg total) by mouth 3 (three) times daily as needed for anxiety. 75 tablet 1   insulin aspart protamine- aspart (NOVOLOG MIX 70/30) (70-30) 100 UNIT/ML injection Inject 0.6 mLs (60 Units total) into the skin 3 (three) times daily after meals. 10 mL 11   pantoprazole (PROTONIX) 40 MG tablet Take 1 tablet (40 mg total) by mouth daily. 30  tablet 1   prazosin (MINIPRESS) 2 MG capsule Take 1 capsule (2 mg total) by mouth at bedtime. 30 capsule 1   rosuvastatin (CRESTOR) 20 MG tablet Take 1 tablet (20 mg total) by mouth daily. 30 tablet 1   traZODone (DESYREL) 100 MG tablet Take 2 tablets (200 mg total) by mouth at bedtime. 60 tablet 1   TRULICITY 1.5 MG/0.5ML SOPN Inject 1.5 mg into the skin once a week. 0.5 mL 4   No current facility-administered medications for this visit.     Musculoskeletal: Strength & Muscle Tone: Unable to assess due to telemedicine visit Gait &  Station: Unable to assess due to telemedicine visit Patient leans: Unable to assess due to telemedicine visit  Psychiatric Specialty Exam: Review of Systems  Psychiatric/Behavioral:  Positive for sleep disturbance. Negative for decreased concentration, dysphoric mood, hallucinations, self-injury and suicidal ideas. The patient is nervous/anxious. The patient is not hyperactive.    There were no vitals taken for this visit.There is no height or weight on file to calculate BMI.  General Appearance: Unable to assess due to telemedicine visit  Eye Contact:  Unable to assess due to telemedicine visit  Speech:  Clear and Coherent and Normal Rate  Volume:  Normal  Mood:  Depressed  Affect:  Congruent  Thought Process:  Coherent, Goal Directed, and Descriptions of Associations: Intact  Orientation:  Full (Time, Place, and Person)  Thought Content: WDL   Suicidal Thoughts:  No  Homicidal Thoughts:  No  Memory:  Immediate;   Good Recent;   Good Remote;   Good  Judgement:  Good  Insight:  Fair  Psychomotor Activity:  Normal  Concentration:  Concentration: Good and Attention Span: Good  Recall:  Good  Fund of Knowledge: Fair  Language: Good  Akathisia:  No  Handed:  Right  AIMS (if indicated): not done  Assets:  Communication Skills Desire for Improvement Financial Resources/Insurance Talents/Skills  ADL's:  Intact  Cognition: WNL  Sleep:  Poor    Screenings: AUDIT    Flowsheet Row Admission (Discharged) from 08/06/2021 in [facility] INPATIENT BEHAVIORAL MEDICINE  Alcohol Use Disorder Identification Test Final Score (AUDIT) 0      CAGE-AID    Flowsheet Row ED to Hosp-Admission (Discharged) from 01/27/2021 in [facility]  CAGE-AID Score 1      ECT-MADRS    Flowsheet Row Admission (Discharged) from 08/06/2021 in [facility] INPATIENT [facility]AVIORAL MEDICINE  MADRS Total Score 41      GAD-7    Flowsheet Row Video Visit from 09/03/2021 in [facility] Video Visit from 07/29/2021 in [facility] Office Visit from 05/08/2021 in [facility]  Total GAD-7 Score 20 8 20       Mini-Mental    Flowsheet Row Admission (Discharged) from 08/06/2021 in [facility]AL MEDICINE  Total Score (max 30 points ) 30      PHQ2-9    Flowsheet Row Video Visit from 09/03/2021 in [facility] ED from 08/05/2021 in [facility] Video Visit from 07/29/2021 in [facility] Office Visit from 05/08/2021 in [facility]  PHQ-2 Total Score 2 5 3 5   PHQ-9 Total Score 8 15 12 20       Flowsheet Row Video Visit from 09/03/2021 in [facility] Admission (Discharged) from 08/06/2021 in [facility] INPATIENT BEHAVIORAL MEDICINE ED from 08/05/2021 in [facility]  C-SSRS RISK CATEGORY High Risk No Risk High Risk        Assessment and Plan:     Collaboration of Care: Collaboration of Care: Medication Management AEB provider managing patient's psychiatric medications and Psychiatrist AEB patient being followed by a mental health provider  Patient/Guardian was advised Release of Information must be obtained prior to any record release in order to collaborate their care with an outside provider. Patient/Guardian was  advised if they have not already done so to contact the registration department to sign all necessary forms in order for [facility] to release information regarding their care.   Consent:  Patient/Guardian gives verbal consent for treatment and assignment of benefits for services provided during this visit. Patient/Guardian expressed understanding and agreed to proceed.   1. Insomnia, unspecified type  - traZODone (DESYREL) 100 MG tablet; Take 2 tablets (200 mg total) by mouth at bedtime.  Dispense: 60 tablet; Refill: 1 - prazosin (MINIPRESS) 2 MG capsule; Take 1 capsule (2 mg total) by mouth at bedtime.  Dispense: 30 capsule; Refill: 1  2. Moderate episode of recurrent major depressive disorder (HCC)  - buPROPion HCl ER, XL, 450 MG TB24; Take 450 mg by mouth daily.  Dispense: 30 tablet; Refill: 1 - DULoxetine (CYMBALTA) 30 MG capsule; Take 3 capsules (90 mg total) by mouth daily.  Dispense: 90 capsule; Refill: 1  3. Generalized anxiety disorder  - hydrOXYzine (ATARAX) 25 MG tablet; Take 1 tablet (25 mg total) by mouth 3 (three) times daily as needed for anxiety.  Dispense: 75 tablet; Refill: 1 - DULoxetine (CYMBALTA) 30 MG capsule; Take 3 capsules (90 mg total) by mouth daily.  Dispense: 90 capsule; Refill: 1  Patient to follow up in 2 months Provider spent a total of 16 minutes with the patient/reviewing patient's chart  Luis Hatchet, PA 09/03/2021, 8:13 PM

## 2021-09-24 ENCOUNTER — Telehealth (HOSPITAL_COMMUNITY): Payer: Medicare Other | Admitting: Physician Assistant

## 2021-11-03 ENCOUNTER — Other Ambulatory Visit (HOSPITAL_COMMUNITY): Payer: Self-pay | Admitting: Physician Assistant

## 2021-11-03 DIAGNOSIS — G4700 Insomnia, unspecified: Secondary | ICD-10-CM

## 2021-11-03 DIAGNOSIS — F411 Generalized anxiety disorder: Secondary | ICD-10-CM

## 2021-11-04 ENCOUNTER — Encounter (HOSPITAL_COMMUNITY): Payer: Self-pay

## 2021-11-04 ENCOUNTER — Telehealth (HOSPITAL_COMMUNITY): Payer: Medicare Other | Admitting: Physician Assistant

## 2021-11-05 NOTE — Telephone Encounter (Signed)
Provider request patient be given a follow up appointment prior to medication being dispensed.

## 2021-11-19 NOTE — Telephone Encounter (Signed)
Patient was contacted to follow up. Patient states that he was just discharged from rehabilitation clinic in [place], his meds were managed there and he has refills of those prescribed meds. Patient states he will not return to [facility] until June. Writer informed patient to call back when he is ready to schedule.

## 2021-12-16 IMAGING — DX DG CHEST 2V
2 series · 2 of 2 positions shown · non-contrast
Comparison: 05/04/2019

CLINICAL DATA: Chest pain

EXAM:
CHEST - 2 VIEW

[chest pa]
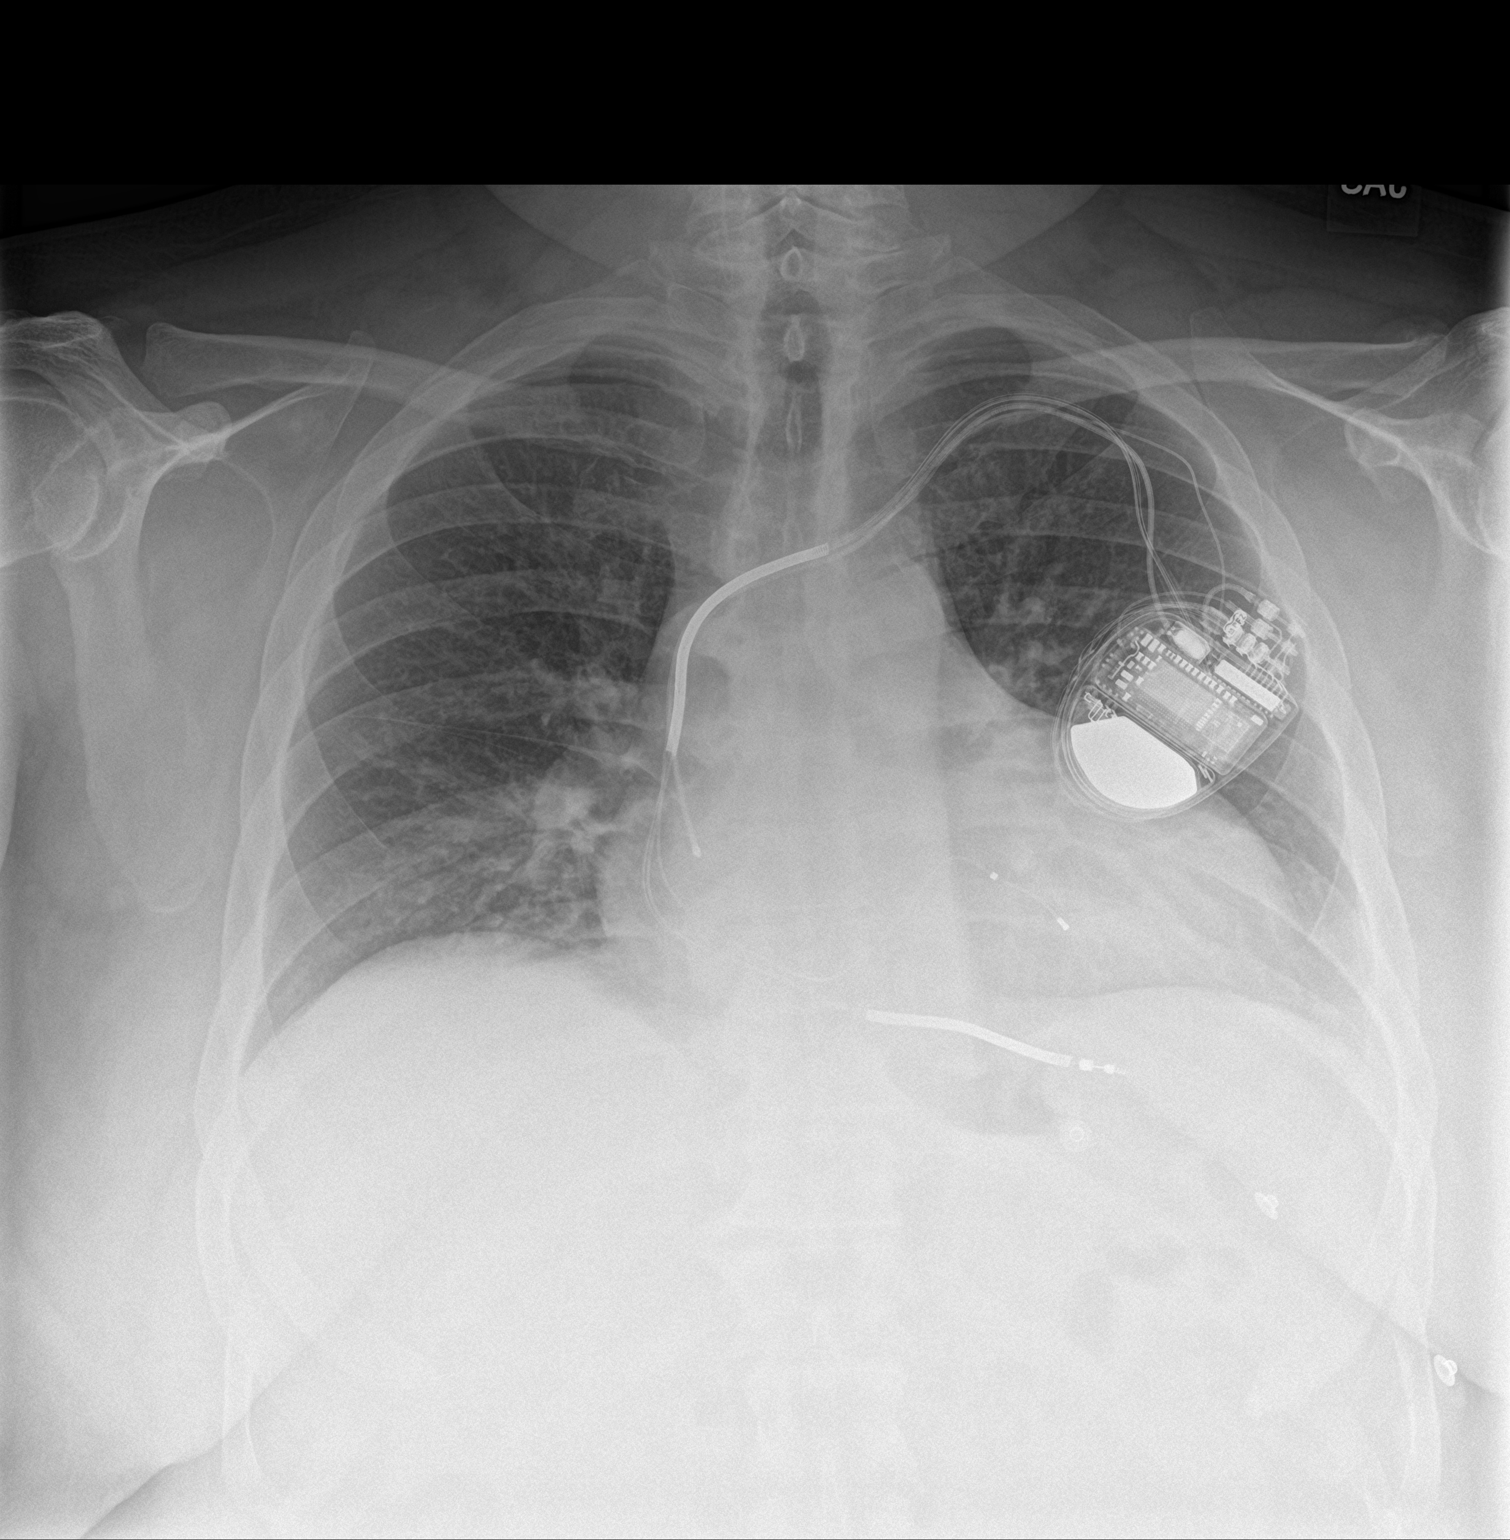

[chest lat]
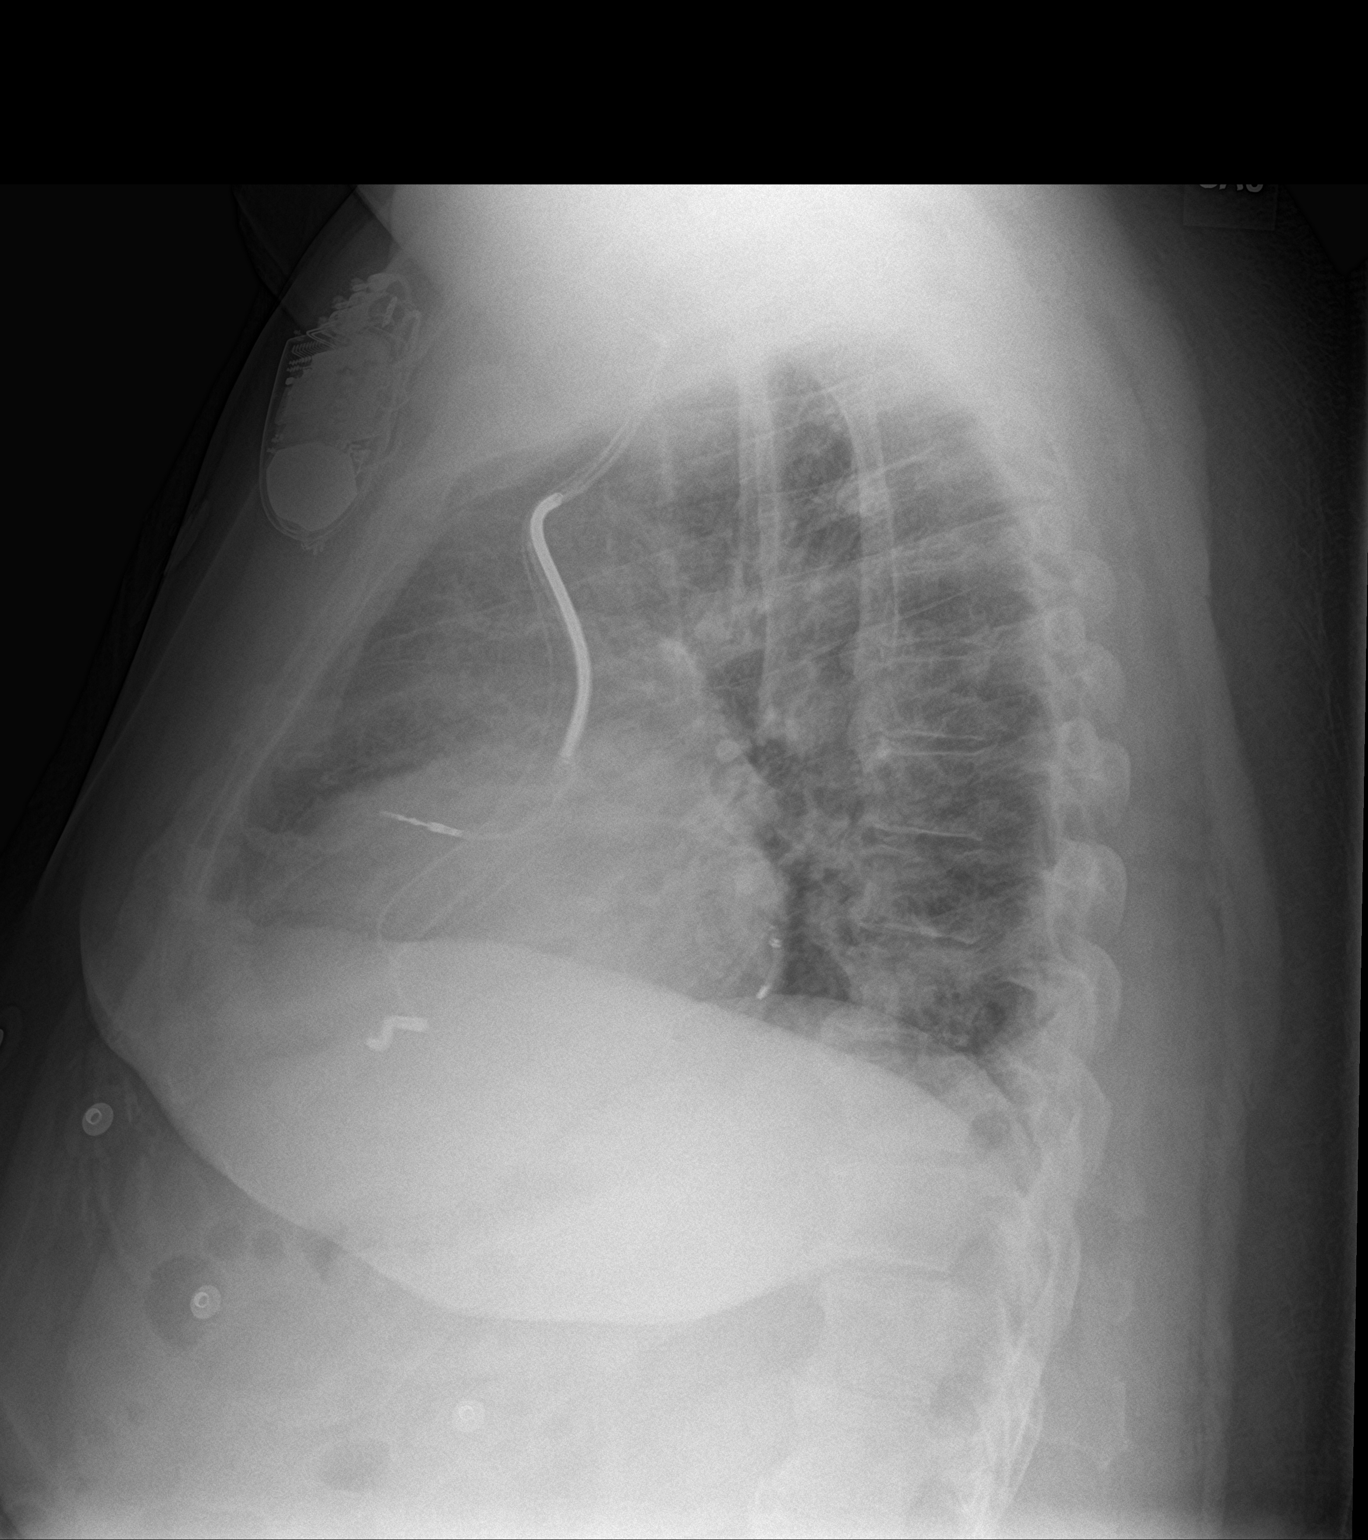

[2 of 2 positions shown; findings below may reference images not displayed]

FINDINGS: Left AICD remains in place, unchanged. Cardiomegaly. Low lung
volumes. No confluent opacities or effusions. No acute bony
abnormality.
IMPRESSION: Cardiomegaly, low lung volumes.  No active disease.

## 2022-01-08 ENCOUNTER — Other Ambulatory Visit (HOSPITAL_COMMUNITY): Payer: Self-pay | Admitting: Physician Assistant

## 2022-01-08 DIAGNOSIS — F411 Generalized anxiety disorder: Secondary | ICD-10-CM

## 2022-01-08 DIAGNOSIS — F331 Major depressive disorder, recurrent, moderate: Secondary | ICD-10-CM

## 2022-01-16 ENCOUNTER — Telehealth (HOSPITAL_COMMUNITY): Payer: Self-pay | Admitting: Physician Assistant

## 2022-01-16 ENCOUNTER — Other Ambulatory Visit (HOSPITAL_COMMUNITY): Payer: Self-pay | Admitting: Physician Assistant

## 2022-01-16 DIAGNOSIS — F331 Major depressive disorder, recurrent, moderate: Secondary | ICD-10-CM

## 2022-01-16 DIAGNOSIS — F411 Generalized anxiety disorder: Secondary | ICD-10-CM

## 2022-02-04 NOTE — Telephone Encounter (Signed)
Patient needs an appointment in order to receive refills

## 2022-02-11 NOTE — Telephone Encounter (Signed)
Seems like encounter was open in error so closing encounter.  

## 2022-02-19 ENCOUNTER — Other Ambulatory Visit (HOSPITAL_COMMUNITY): Payer: Self-pay | Admitting: Physician Assistant

## 2022-02-19 DIAGNOSIS — G4700 Insomnia, unspecified: Secondary | ICD-10-CM

## 2022-02-26 ENCOUNTER — Inpatient Hospital Stay (HOSPITAL_COMMUNITY)
Admission: EM | Admit: 2022-02-26 | Discharge: 2022-03-08 | DRG: 286 | Disposition: A | Discharge status: Home Health | Payer: Medicare Other | Attending: Family Medicine | Admitting: Family Medicine

## 2022-02-26 ENCOUNTER — Encounter (HOSPITAL_COMMUNITY): Payer: Self-pay | Admitting: Emergency Medicine

## 2022-02-26 ENCOUNTER — Observation Stay (HOSPITAL_COMMUNITY): Payer: Medicare Other

## 2022-02-26 ENCOUNTER — Other Ambulatory Visit: Payer: Self-pay

## 2022-02-26 ENCOUNTER — Emergency Department (HOSPITAL_COMMUNITY): Payer: Medicare Other

## 2022-02-26 DIAGNOSIS — F39 Unspecified mood [affective] disorder: Secondary | ICD-10-CM

## 2022-02-26 DIAGNOSIS — Z7902 Long term (current) use of antithrombotics/antiplatelets: Secondary | ICD-10-CM

## 2022-02-26 DIAGNOSIS — F1410 Cocaine abuse, uncomplicated: Secondary | ICD-10-CM | POA: Diagnosis not present

## 2022-02-26 DIAGNOSIS — Z880 Allergy status to penicillin: Secondary | ICD-10-CM

## 2022-02-26 DIAGNOSIS — I471 Supraventricular tachycardia: Secondary | ICD-10-CM | POA: Diagnosis present

## 2022-02-26 DIAGNOSIS — Z8249 Family history of ischemic heart disease and other diseases of the circulatory system: Secondary | ICD-10-CM

## 2022-02-26 DIAGNOSIS — Z87891 Personal history of nicotine dependence: Secondary | ICD-10-CM

## 2022-02-26 DIAGNOSIS — E1169 Type 2 diabetes mellitus with other specified complication: Secondary | ICD-10-CM

## 2022-02-26 DIAGNOSIS — K219 Gastro-esophageal reflux disease without esophagitis: Secondary | ICD-10-CM | POA: Diagnosis present

## 2022-02-26 DIAGNOSIS — I5023 Acute on chronic systolic (congestive) heart failure: Secondary | ICD-10-CM | POA: Diagnosis not present

## 2022-02-26 DIAGNOSIS — R778 Other specified abnormalities of plasma proteins: Secondary | ICD-10-CM

## 2022-02-26 DIAGNOSIS — I10 Essential (primary) hypertension: Secondary | ICD-10-CM

## 2022-02-26 DIAGNOSIS — E0781 Sick-euthyroid syndrome: Secondary | ICD-10-CM | POA: Diagnosis present

## 2022-02-26 DIAGNOSIS — I5043 Acute on chronic combined systolic (congestive) and diastolic (congestive) heart failure: Secondary | ICD-10-CM

## 2022-02-26 DIAGNOSIS — Z09 Encounter for follow-up examination after completed treatment for conditions other than malignant neoplasm: Secondary | ICD-10-CM

## 2022-02-26 DIAGNOSIS — B3749 Other urogenital candidiasis: Secondary | ICD-10-CM | POA: Diagnosis not present

## 2022-02-26 DIAGNOSIS — I428 Other cardiomyopathies: Secondary | ICD-10-CM | POA: Diagnosis present

## 2022-02-26 DIAGNOSIS — I110 Hypertensive heart disease with heart failure: Secondary | ICD-10-CM | POA: Diagnosis not present

## 2022-02-26 DIAGNOSIS — I2510 Atherosclerotic heart disease of native coronary artery without angina pectoris: Secondary | ICD-10-CM | POA: Diagnosis present

## 2022-02-26 DIAGNOSIS — Z794 Long term (current) use of insulin: Secondary | ICD-10-CM

## 2022-02-26 DIAGNOSIS — E119 Type 2 diabetes mellitus without complications: Secondary | ICD-10-CM

## 2022-02-26 DIAGNOSIS — E785 Hyperlipidemia, unspecified: Secondary | ICD-10-CM | POA: Diagnosis present

## 2022-02-26 DIAGNOSIS — Z91040 Latex allergy status: Secondary | ICD-10-CM

## 2022-02-26 DIAGNOSIS — Z886 Allergy status to analgesic agent status: Secondary | ICD-10-CM

## 2022-02-26 DIAGNOSIS — Z9981 Dependence on supplemental oxygen: Secondary | ICD-10-CM

## 2022-02-26 DIAGNOSIS — Z6841 Body Mass Index (BMI) 40.0 and over, adult: Secondary | ICD-10-CM

## 2022-02-26 DIAGNOSIS — E782 Mixed hyperlipidemia: Secondary | ICD-10-CM

## 2022-02-26 DIAGNOSIS — Z884 Allergy status to anesthetic agent status: Secondary | ICD-10-CM

## 2022-02-26 DIAGNOSIS — Z91041 Radiographic dye allergy status: Secondary | ICD-10-CM

## 2022-02-26 DIAGNOSIS — Z720 Tobacco use: Secondary | ICD-10-CM

## 2022-02-26 DIAGNOSIS — E876 Hypokalemia: Secondary | ICD-10-CM | POA: Diagnosis present

## 2022-02-26 DIAGNOSIS — Z950 Presence of cardiac pacemaker: Secondary | ICD-10-CM

## 2022-02-26 DIAGNOSIS — Z801 Family history of malignant neoplasm of trachea, bronchus and lung: Secondary | ICD-10-CM

## 2022-02-26 DIAGNOSIS — H9202 Otalgia, left ear: Secondary | ICD-10-CM | POA: Diagnosis present

## 2022-02-26 DIAGNOSIS — E66813 Obesity, class 3: Secondary | ICD-10-CM | POA: Diagnosis present

## 2022-02-26 DIAGNOSIS — F32A Depression, unspecified: Secondary | ICD-10-CM | POA: Diagnosis present

## 2022-02-26 DIAGNOSIS — F4310 Post-traumatic stress disorder, unspecified: Secondary | ICD-10-CM | POA: Diagnosis present

## 2022-02-26 DIAGNOSIS — Z95810 Presence of automatic (implantable) cardiac defibrillator: Secondary | ICD-10-CM

## 2022-02-26 DIAGNOSIS — J9611 Chronic respiratory failure with hypoxia: Secondary | ICD-10-CM

## 2022-02-26 DIAGNOSIS — G4730 Sleep apnea, unspecified: Secondary | ICD-10-CM | POA: Diagnosis present

## 2022-02-26 DIAGNOSIS — Z888 Allergy status to other drugs, medicaments and biological substances status: Secondary | ICD-10-CM

## 2022-02-26 DIAGNOSIS — N400 Enlarged prostate without lower urinary tract symptoms: Secondary | ICD-10-CM | POA: Diagnosis present

## 2022-02-26 DIAGNOSIS — D649 Anemia, unspecified: Secondary | ICD-10-CM | POA: Diagnosis present

## 2022-02-26 DIAGNOSIS — R531 Weakness: Secondary | ICD-10-CM | POA: Diagnosis present

## 2022-02-26 DIAGNOSIS — G4733 Obstructive sleep apnea (adult) (pediatric): Secondary | ICD-10-CM

## 2022-02-26 DIAGNOSIS — E1165 Type 2 diabetes mellitus with hyperglycemia: Secondary | ICD-10-CM | POA: Diagnosis present

## 2022-02-26 DIAGNOSIS — Z79899 Other long term (current) drug therapy: Secondary | ICD-10-CM

## 2022-02-26 LAB — BASIC METABOLIC PANEL
Anion gap: 8 (ref 5–15)
BUN: 9 mg/dL (ref 6–20)
CO2: 25 mmol/L (ref 22–32)
Calcium: 8.6 mg/dL — ABNORMAL LOW (ref 8.9–10.3)
Chloride: 106 mmol/L (ref 98–111)
Creatinine, Ser: 0.87 mg/dL (ref 0.61–1.24)
GFR, Estimated: >60 mL/min (ref >60)
Glucose, Bld: 166 mg/dL — ABNORMAL HIGH (ref 70–99)
Potassium: 3.7 mmol/L (ref 3.5–5.1)
Sodium: 139 mmol/L (ref 135–145)

## 2022-02-26 LAB — BLOOD GAS, VENOUS
Acid-Base Excess: 7.3 mmol/L — ABNORMAL HIGH (ref 0.0–2.0)
Bicarbonate: 33.6 mmol/L — ABNORMAL HIGH (ref 20.0–28.0)
Drawn by: 585601
O2 Saturation: 44.1 %
Patient temperature: 36.9
pCO2, Ven: 53 mmHg (ref 44–60)
pH, Ven: 7.41 (ref 7.25–7.43)
pO2, Ven: <31 mmHg — CL (ref 32–45)

## 2022-02-26 LAB — BRAIN NATRIURETIC PEPTIDE: B Natriuretic Peptide: 136.5 pg/mL — ABNORMAL HIGH (ref 0.0–100.0)

## 2022-02-26 LAB — CBC WITH DIFFERENTIAL/PLATELET
Abs Immature Granulocytes: 0.06 10*3/uL (ref 0.00–0.07)
Basophils Absolute: 0 10*3/uL (ref 0.0–0.1)
Basophils Relative: 0 %
Eosinophils Absolute: 0.1 10*3/uL (ref 0.0–0.5)
Eosinophils Relative: 1 %
HCT: 35.7 % — ABNORMAL LOW (ref 39.0–52.0)
Hemoglobin: 11.7 g/dL — ABNORMAL LOW (ref 13.0–17.0)
Immature Granulocytes: 1 %
Lymphocytes Relative: 21 %
Lymphs Abs: 2.2 10*3/uL (ref 0.7–4.0)
MCH: 29.3 pg (ref 26.0–34.0)
MCHC: 32.8 g/dL (ref 30.0–36.0)
MCV: 89.5 fL (ref 80.0–100.0)
Monocytes Absolute: 1.1 10*3/uL — ABNORMAL HIGH (ref 0.1–1.0)
Monocytes Relative: 11 %
Neutro Abs: 7.1 10*3/uL (ref 1.7–7.7)
Neutrophils Relative %: 66 %
Platelets: 249 10*3/uL (ref 150–400)
RBC: 3.99 MIL/uL — ABNORMAL LOW (ref 4.22–5.81)
RDW: 13.7 % (ref 11.5–15.5)
WBC: 10.6 10*3/uL — ABNORMAL HIGH (ref 4.0–10.5)
nRBC: 0 % (ref 0.0–0.2)

## 2022-02-26 LAB — RAPID URINE DRUG SCREEN, HOSP PERFORMED
Amphetamines: NOT DETECTED
Barbiturates: NOT DETECTED
Benzodiazepines: NOT DETECTED
Cocaine: POSITIVE — AB
Opiates: NOT DETECTED
Tetrahydrocannabinol: NOT DETECTED

## 2022-02-26 LAB — GLUCOSE, CAPILLARY
Glucose-Capillary: 108 mg/dL — ABNORMAL HIGH (ref 70–99)
Glucose-Capillary: 145 mg/dL — ABNORMAL HIGH (ref 70–99)
Glucose-Capillary: 252 mg/dL — ABNORMAL HIGH (ref 70–99)

## 2022-02-26 LAB — TROPONIN I (HIGH SENSITIVITY)
Troponin I (High Sensitivity): 19 ng/L — ABNORMAL HIGH (ref <18)
Troponin I (High Sensitivity): 12 ng/L (ref <18)

## 2022-02-26 LAB — CBG MONITORING, ED: Glucose-Capillary: 176 mg/dL — ABNORMAL HIGH (ref 70–99)

## 2022-02-26 LAB — HEMOGLOBIN A1C
Hgb A1c MFr Bld: 9.1 % — ABNORMAL HIGH (ref 4.8–5.6)
Mean Plasma Glucose: 214.47 mg/dL

## 2022-02-26 MED ORDER — PRAZOSIN HCL 2 MG PO CAPS
2.0000 mg | ORAL_CAPSULE | Freq: Every day | ORAL | Status: DC
  Administered 2022-02-27 – 2022-03-07 (×9): 2 mg via ORAL
  Filled 2022-02-26 (×11): qty 1

## 2022-02-26 MED ORDER — INSULIN ASPART 100 UNIT/ML IJ SOLN
4.0000 [IU] | Freq: Three times a day (TID) | INTRAMUSCULAR | Status: DC
  Administered 2022-02-26 – 2022-03-01 (×7): 4 [IU] via SUBCUTANEOUS

## 2022-02-26 MED ORDER — ACETAMINOPHEN 325 MG PO TABS
650.0000 mg | ORAL_TABLET | Freq: Once | ORAL | Status: AC
  Administered 2022-02-26: 650 mg via ORAL
  Filled 2022-02-26: qty 2

## 2022-02-26 MED ORDER — SODIUM CHLORIDE 0.9 % IV SOLN
250.0000 mL | INTRAVENOUS | Status: DC | PRN
Start: 2022-02-26 — End: 2022-03-08

## 2022-02-26 MED ORDER — FUROSEMIDE 10 MG/ML IJ SOLN
80.0000 mg | Freq: Once | INTRAMUSCULAR | Status: AC
  Administered 2022-02-26: 80 mg via INTRAVENOUS
  Filled 2022-02-26: qty 8

## 2022-02-26 MED ORDER — PANTOPRAZOLE SODIUM 40 MG PO TBEC
40.0000 mg | DELAYED_RELEASE_TABLET | Freq: Every day | ORAL | Status: DC
  Administered 2022-02-26 – 2022-03-08 (×11): 40 mg via ORAL
  Filled 2022-02-26 (×11): qty 1

## 2022-02-26 MED ORDER — ONDANSETRON HCL 4 MG/2ML IJ SOLN: 4.0000 mg | Freq: Four times a day (QID) | INTRAMUSCULAR | Status: DC | PRN

## 2022-02-26 MED ORDER — INSULIN GLARGINE-YFGN 100 UNIT/ML ~~LOC~~ SOLN
15.0000 [IU] | Freq: Every day | SUBCUTANEOUS | Status: DC
  Administered 2022-02-26 – 2022-03-02 (×5): 15 [IU] via SUBCUTANEOUS
  Filled 2022-02-26 (×5): qty 0.15

## 2022-02-26 MED ORDER — INSULIN ASPART 100 UNIT/ML IJ SOLN
0.0000 [IU] | Freq: Every day | INTRAMUSCULAR | Status: DC
  Administered 2022-02-26: 3 [IU] via SUBCUTANEOUS
  Administered 2022-02-28: 2 [IU] via SUBCUTANEOUS

## 2022-02-26 MED ORDER — ACETAMINOPHEN 325 MG PO TABS
650.0000 mg | ORAL_TABLET | ORAL | Status: DC | PRN
  Administered 2022-03-02 – 2022-03-08 (×8): 650 mg via ORAL
  Filled 2022-02-26 (×8): qty 2

## 2022-02-26 MED ORDER — INSULIN ASPART 100 UNIT/ML IJ SOLN
0.0000 [IU] | Freq: Three times a day (TID) | INTRAMUSCULAR | Status: DC
  Administered 2022-02-26: 3 [IU] via SUBCUTANEOUS
  Administered 2022-02-26 – 2022-02-27 (×2): 2 [IU] via SUBCUTANEOUS
  Administered 2022-02-27: 3 [IU] via SUBCUTANEOUS
  Administered 2022-02-28: 5 [IU] via SUBCUTANEOUS
  Administered 2022-02-28: 3 [IU] via SUBCUTANEOUS
  Administered 2022-02-28: 5 [IU] via SUBCUTANEOUS
  Administered 2022-03-01 (×2): 3 [IU] via SUBCUTANEOUS

## 2022-02-26 MED ORDER — DAPAGLIFLOZIN PROPANEDIOL 10 MG PO TABS
10.0000 mg | ORAL_TABLET | Freq: Every day | ORAL | Status: DC
Start: 2022-02-26 — End: 2022-03-08
  Administered 2022-02-26 – 2022-03-08 (×11): 10 mg via ORAL
  Filled 2022-02-26 (×11): qty 1

## 2022-02-26 MED ORDER — ROSUVASTATIN CALCIUM 20 MG PO TABS
20.0000 mg | ORAL_TABLET | Freq: Every day | ORAL | Status: DC
  Administered 2022-02-26 – 2022-03-08 (×11): 20 mg via ORAL
  Filled 2022-02-26 (×11): qty 1

## 2022-02-26 MED ORDER — SODIUM CHLORIDE 0.9% FLUSH
3.0000 mL | Freq: Two times a day (BID) | INTRAVENOUS | Status: DC
  Administered 2022-02-26 – 2022-03-08 (×18): 3 mL via INTRAVENOUS

## 2022-02-26 MED ORDER — IPRATROPIUM-ALBUTEROL 0.5-2.5 (3) MG/3ML IN SOLN
3.0000 mL | Freq: Four times a day (QID) | RESPIRATORY_TRACT | Status: DC | PRN
Start: 2022-02-26 — End: 2022-03-08
  Administered 2022-02-26 – 2022-02-28 (×4): 3 mL via RESPIRATORY_TRACT
  Filled 2022-02-26 (×5): qty 3

## 2022-02-26 MED ORDER — SODIUM CHLORIDE 0.9% FLUSH: 3.0000 mL | INTRAVENOUS | Status: DC | PRN

## 2022-02-26 MED ORDER — NITROGLYCERIN 2 % TD OINT
1.0000 [in_us] | TOPICAL_OINTMENT | Freq: Once | TRANSDERMAL | Status: AC
  Administered 2022-02-26: 1 [in_us] via TOPICAL
  Filled 2022-02-26: qty 1

## 2022-02-26 MED ORDER — ACETAZOLAMIDE 250 MG PO TABS
500.0000 mg | ORAL_TABLET | Freq: Two times a day (BID) | ORAL | Status: DC
Start: 2022-02-26 — End: 2022-02-26

## 2022-02-26 MED ORDER — HEPARIN SODIUM (PORCINE) 5000 UNIT/ML IJ SOLN
5000.0000 [IU] | Freq: Three times a day (TID) | INTRAMUSCULAR | Status: DC
  Administered 2022-02-26 – 2022-03-08 (×27): 5000 [IU] via SUBCUTANEOUS
  Filled 2022-02-26 (×29): qty 1

## 2022-02-26 MED ORDER — FUROSEMIDE 10 MG/ML IJ SOLN
80.0000 mg | Freq: Two times a day (BID) | INTRAMUSCULAR | Status: DC
  Administered 2022-02-26 – 2022-02-27 (×3): 80 mg via INTRAVENOUS
  Filled 2022-02-26 (×3): qty 8

## 2022-02-26 NOTE — Assessment & Plan Note (Signed)
Chronic Continue Crestor 20 mg daily 

## 2022-02-26 NOTE — Assessment & Plan Note (Signed)
Chronic.  Continue CPAP.

## 2022-02-26 NOTE — Assessment & Plan Note (Addendum)
Admit to observation card telemetry bed.  Continue with Lasix 80 mg IV every 12 hours.  Update his echo.  Hold his Coreg until cocaine abuse has been ruled out with urine drug screen.  Patient follows with [facility] cardiology.  Unclear why the patient with an EF of 30% is not on Entresto.  Deferred to his [facility] outpatient cardiologist for guideline directed medical therapy. Add farxiga. Add diamox during hospitalization

## 2022-02-26 NOTE — Assessment & Plan Note (Signed)
Chronic.  Continue supplemental oxygen.

## 2022-02-26 NOTE — Assessment & Plan Note (Signed)
-  Continue insulin ?

## 2022-02-26 NOTE — H&P (Addendum)
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  Patient states that he got out of drug and alcohol rehab 3 days ago.  He has been having increasing shortness of breath for the last week.  Patient was at the local ER in [facility].  Per the ER documentation, the patient has a pacemaker that is within 3 months of end-of-life of battery.  He had his pacemaker placed at [facility] with [facility] in January 2020.  Patient called EMS today due to worsening shortness of breath.  He states that he is normally taking 80 mg of Lasix a day.  He end up taking 360 mg of Lasix yesterday due to swollen feet.  He states that over the last year he is lost about 200 pounds.  He however does not weigh himself every day.  Nor does he check how much liquids he is drinking in a day.  He states he has never been given CHF education.  Work-up in the ER shows mildly elevated BNP 136  Chest x-ray shows mild vascular congestion without pulmonary edema  Patient denies any recent drug use.  He did get out of rehab 3 days ago.  He was using cocaine just 10 months ago.  Urine drug screen is pending.  He was given 80 mg of IV Lasix and is urinated over a liter so far.  EDP does not believe the patient can be discharged home  [facility] hospital[facility] contacted for  admission.   ED Course: BNP slightly elevated.  Chest x-ray shows vascular congestion without pulmonary edema.  Patient remains dyspneic.  Urine drug screen pending for cocaine.  Review of Systems:  Review of Systems  Constitutional: Negative.   HENT:  Positive for hearing loss.        Chronic hearing loss  Eyes: Negative.   Respiratory:  Positive for cough and shortness of breath.   Cardiovascular:  Positive for leg swelling.  Gastrointestinal: Negative.   Genitourinary: Negative.   Musculoskeletal: Negative.   Skin: Negative.   Neurological: Negative.   Endo/Heme/Allergies: Negative.   Psychiatric/Behavioral:  Positive for substance abuse.        Hx of polysubstance abuse including cocaine  All other systems reviewed and are negative.   Past Medical History:  Diagnosis Date   Anxiety    Biventricular ICD (implantable cardioverter-defibrillator) in place    Chronic systolic CHF (congestive heart failure) (HCC)    Cocaine use    Diabetes mellitus without complication (HCC)    GERD (gastroesophageal reflux disease)    HLD (hyperlipidemia)    Hypertension    Morbid obesity (HCC)    NICM (nonischemic cardiomyopathy) (HCC)    OSA (obstructive sleep apnea)    PTSD (post-traumatic stress disorder)    on Depakote   Refusal of blood transfusions as patient is Jehovah's Witness    Tobacco abuse     Past  Surgical History:  Procedure Laterality Date   BIV ICD INSERTION CRT-D     FOOT FRACTURE SURGERY     ROTATOR CUFF REPAIR     TONSILLECTOMY       reports that he has been smoking cigarettes. He has never used smokeless tobacco. He reports that he does not currently use alcohol. He reports current drug use. Drug: Cocaine.  Allergies  Allergen Reactions   Aspirin Anaphylaxis and Other (See Comments)    Throat closing    Iodine-131 Hives   Iodinated Contrast Media Hives   Latex Hives and Rash   Penicillins Nausea And Vomiting   Tramadol Hives and Nausea Only    Amoxicillin-Pot Clavulanate Diarrhea   Lidocaine Rash   Lisinopril Cough    Family History  Problem Relation Age of Onset   Hypertension Mother    Bone cancer Father    Lung cancer Father     Prior to Admission medications   Medication Sig Start Date End Date Taking? Authorizing Provider  buPROPion HCl ER, XL, 450 MG TB24 Take 450 mg by mouth daily. 09/03/21   Nwoko, Terese Door, PA  carvedilol (COREG) 6.25 MG tablet Take 1 tablet (6.25 mg total) by mouth 2 (two) times daily with a meal. 08/21/21   Clapacs, Madie Reno, MD  clopidogrel (PLAVIX) 75 MG tablet Take 1 tablet (75 mg total) by mouth daily. 08/22/21   Clapacs, Madie Reno, MD  DULoxetine (CYMBALTA) 30 MG capsule Take 3 capsules (90 mg total) by mouth daily. 09/03/21   Nwoko, Terese Door, PA  furosemide (LASIX) 80 MG tablet Take 1 tablet (80 mg total) by mouth daily. 08/21/21   Clapacs, Madie Reno, MD  hydrOXYzine (ATARAX) 25 MG tablet Take 1 tablet (25 mg total) by mouth 3 (three) times daily as needed for anxiety. 09/03/21   Nwoko, Terese Door, PA  insulin aspart protamine- aspart (NOVOLOG MIX 70/30) (70-30) 100 UNIT/ML injection Inject 0.6 mLs (60 Units total) into the skin 3 (three) times daily after meals. 08/21/21   Clapacs, Madie Reno, MD  pantoprazole (PROTONIX) 40 MG tablet Take 1 tablet (40 mg total) by mouth daily. 08/21/21   Clapacs, Madie Reno, MD  prazosin (MINIPRESS) 2 MG capsule Take 1 capsule (2 mg total) by mouth at bedtime. 09/03/21   Nwoko, Terese Door, PA  rosuvastatin (CRESTOR) 20 MG tablet Take 1 tablet (20 mg total) by mouth daily. 08/22/21   Clapacs, Madie Reno, MD  traZODone (DESYREL) 100 MG tablet Take 2 tablets (200 mg total) by mouth at bedtime. 09/03/21   Nwoko, Isidoro Donning E, PA  TRULICITY 1.5 0000000 SOPN Inject 1.5 mg into the skin once a week. 08/21/21   Clapacs, Madie Reno, MD    Physical Exam: Vitals:   02/26/22 [id number] 02/26/22 0255 02/26/22 0258 02/26/22 0415  BP:    115/77  Pulse:    (!) 102  Resp:    (!) 28  Temp: 99.2 F (37.3 C)      TempSrc: Oral     SpO2:  99%  100%  Weight:   127 kg   Height:   5\' 5"  (1.651 m)     Physical Exam Vitals and nursing note reviewed.  Constitutional:      General: He is not in acute distress.    Appearance: He is obese. He is not diaphoretic.  HENT:     Head: Normocephalic and atraumatic.     Nose: Nose normal.  Eyes:     Pupils: Pupils are equal, round,  and reactive to light.  Cardiovascular:     Rate and Rhythm: Regular rhythm. Tachycardia present.  Pulmonary:     Effort: No respiratory distress.     Breath sounds: Rhonchi present.  Abdominal:     General: There is no distension.     Palpations: Abdomen is soft.     Tenderness: There is no guarding or rebound.  Musculoskeletal:     Comments: Trace pretibial, pedal edema bilaterally  Skin:    General: Skin is warm and dry.     Capillary Refill: Capillary refill takes less than 2 seconds.  Neurological:     General: No focal deficit present.     Mental Status: He is alert and oriented to person, place, and time.      Labs on Admission: I have personally reviewed following labs and imaging studies  CBC: Recent Labs  Lab 02/26/22 0315  WBC 10.6*  NEUTROABS 7.1  HGB 11.7*  HCT 35.7*  MCV 89.5  PLT 0000000   Basic Metabolic Panel: Recent Labs  Lab 02/26/22 0315  NA 139  K 3.7  CL 106  CO2 25  GLUCOSE 166*  BUN 9  CREATININE 0.87  CALCIUM 8.6*   GFR: Estimated Creatinine Clearance: 121.8 mL/min (by C-G formula based on SCr of 0.87 mg/dL). Liver Function Tests: No results for input(s): "AST", "ALT", "ALKPHOS", "BILITOT", "PROT", "ALBUMIN" in the last 168 hours. No results for input(s): "LIPASE", "AMYLASE" in the last 168 hours. No results for input(s): "AMMONIA" in the last 168 hours. Coagulation Profile: No results for input(s): "INR", "PROTIME" in the last 168 hours. Cardiac Enzymes: Recent Labs  Lab 02/26/22 0315  TROPONINIHS 19*   BNP (last 3 results) No results for input(s): "PROBNP" in the  last 8760 hours. HbA1C: No results for input(s): "HGBA1C" in the last 72 hours. CBG: No results for input(s): "GLUCAP" in the last 168 hours. Lipid Profile: No results for input(s): "CHOL", "HDL", "LDLCALC", "TRIG", "CHOLHDL", "LDLDIRECT" in the last 72 hours. Thyroid Function Tests: No results for input(s): "TSH", "T4TOTAL", "FREET4", "T3FREE", "THYROIDAB" in the last 72 hours. Anemia Panel: No results for input(s): "VITAMINB12", "FOLATE", "FERRITIN", "TIBC", "IRON", "RETICCTPCT" in the last 72 hours. Urine analysis:    Component Value Date/Time   COLORURINE YELLOW 08/05/2021 1429   APPEARANCEUR CLEAR 08/05/2021 1429   LABSPEC 1.030 08/05/2021 1429   PHURINE 5.0 08/05/2021 1429   GLUCOSEU >=500 (A) 08/05/2021 1429   HGBUR SMALL (A) 08/05/2021 1429   BILIRUBINUR NEGATIVE 08/05/2021 1429   KETONESUR NEGATIVE 08/05/2021 1429   PROTEINUR NEGATIVE 08/05/2021 1429   NITRITE NEGATIVE 08/05/2021 1429   LEUKOCYTESUR NEGATIVE 08/05/2021 1429    Radiological Exams on Admission: I have personally reviewed images DG Chest Port 1 View  Result Date: 02/26/2022 CLINICAL DATA:  Shortness of breath EXAM: PORTABLE CHEST 1 VIEW COMPARISON:  08/10/2021 FINDINGS: Cardiac shadow is enlarged. Defibrillator is again noted. Lungs are well aerated bilaterally. No focal infiltrate or sizable effusion is seen. Mild central vascular congestion is noted. IMPRESSION: Mild vascular congestion without edema. Stable cardiomegaly. Electronically Signed   By: Inez Catalina M.D.   On: 02/26/2022 03:28    EKG: My personal interpretation of EKG shows: paced rhythm    Assessment/Plan Principal Problem:   Acute on chronic systolic CHF (congestive heart failure) (HCC) Active Problems:   Cocaine abuse (HCC)   Pacemaker   HLD (hyperlipidemia)   Diabetes mellitus without complication ([place])   Hypertension   Tobacco abuse   Morbid obesity ([place])  Sleep apnea   Chronic respiratory failure with hypoxia (HCC) - 2.5  L/min    Assessment and Plan: * Acute on chronic systolic CHF (congestive heart failure) (HCC) Admit to observation card telemetry bed.  Continue with Lasix 80 mg IV every 12 hours.  Update his echo.  Hold his Coreg until cocaine abuse has been ruled out with urine drug screen.  Patient follows with [facility] cardiology.  Unclear why the patient with an EF of 30% is not on Entresto.  Deferred to his [facility] outpatient cardiologist for guideline directed medical therapy. Add farxiga. Add diamox during hospitalization  Cocaine abuse [facility]) Patient with a history of polysubstance abuse including cocaine.  Check urine drug screen.  He just got out of drug and alcohol rehab 3 days ago.  Chronic respiratory failure with hypoxia (HCC) - 2.5 L/min Chronic.  Continue supplemental oxygen.  Sleep apnea Chronic.  Continue CPAP.  Morbid obesity (HCC) Chronic.  BMI is 46.59  Tobacco abuse Chronic.  As needed nicotine patch.  Hypertension Continue Lasix.  Hold Coreg until cocaine abuse has been ruled out with urine drug screen.  Diabetes mellitus without complication (HCC) Continue insulin.  HLD (hyperlipidemia) Chronic.  Continue Crestor 20 mg daily.  Pacemaker Patient's pacemaker is near end-of-life according to the ER note from this month and [facility].  His electrophysiologist is associate with [facility] in [place].  Patient was referred back to his electrophysiologist for generator change out.   DVT prophylaxis: SQ Heparin Code Status: Full Code Family Communication: no family at bedside  Disposition Plan: return home  Consults called: none  Admission status: Observation, Telemetry bed   Carollee Herter, DO [facility] 02/26/2022, 5:45 AM

## 2022-02-26 NOTE — ED Triage Notes (Signed)
Per EMS, pt from home c/o gradual worsening sharp substernal chest pain and SOB. Pt found to be 91% RA, EMS placed pt on 10L and increased to 97% (no home O2). [place] in all fields, hx of CHF. He noticed that he was swollen Wednesday morning so he took 4X his lasix (360mg ).    138/84 103 pulse CBG 181 20G L arm

## 2022-02-26 NOTE — Progress Notes (Signed)
Heart Failure Stewardship Pharmacist Progress Note   PCP: Margot Ables, MD (Inactive) PCP-Cardiologist: Bryan Lemma, MD    HPI:  51 yo M with PMH of HTN, T2DM, HLD, HFrEF since ~2000, polysubstance use, OSA on CPAP, and PPM.   He presented to the ED on 8/31 with chest pain, shortness of breath, and LE edema. He reports he took 320 mg of lasix without any improvement. CXR with mild vascular congestion without pulmonary edema. He states he got out of drug and alcohol rehab 3 days PTA, but UDS was positive for cocaine on admission. ECHO on 9/1 pending.   Current HF Medications: Diuretic: furosemide 80 mg IV daily SGLT2i: Farxiga 10 mg daily  Prior to admission HF Medications: Diuretic: furosemide 80 mg daily Beta blocker: carvedilol 6.25 mg BID *metoprolol XL also on list but not reporting as taking  Pertinent Lab Values: Serum creatinine 1.01, BUN 13, Potassium 3.2, Sodium 139, BNP 136.5, Magnesium 2.1, A1c 9.1  Vital Signs: Weight: 298 lbs (admission weight: 300 lbs) Blood pressure: 120-140/60s  Heart rate: 80-90s (v-paced)  I/O: -3.1L yesterday; net -4.1L  Medication Assistance / Insurance Benefits Check: Does the patient have prescription insurance?  Yes Type of insurance plan: [place] Medicaid  Outpatient Pharmacy:  Prior to admission outpatient pharmacy: [facility] Is the patient willing to use [facility] TOC pharmacy at discharge? Yes Is the patient willing to transition their outpatient pharmacy to utilize a [facility] outpatient pharmacy?   Pending    Assessment: 1. Acute on chronic systolic CHF (LVEF 30%, pending repeat ECHO), due to NICM. NYHA class IV symptoms. - Continue furosemide 80 mg IV daily. Strict I/Os. Daily weights. Keep K>4 and mag>2. KCl 40 mEq x 2 ordered for replacement.  - Carvedilol has been on hold with cocaine use. Consider restarting with unopposed alpha.  - Consider starting Entresto 24/26 mg BID for GDMT optimization - Conisder  starting spironolactone prior to discharge for GDMT optimization - Continue Farxiga 10 mg daily   Plan: 1) Medication changes recommended at this time: - Restart carvedilol 6.25 mg BID - Start Entresto 24/26 mg BID - Remove metoprolol XL from med list on discharge  2) Patient assistance: - Has [place] Medicaid - Entresto copay $0 - Farxiga copay $0  3)  Education  - To be completed prior to discharge  Sharen Hones, PharmD, BCPS Heart Failure Engineer, building services Phone [phone]

## 2022-02-26 NOTE — Subjective & Objective (Signed)
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  Patient states that he got out of drug and alcohol rehab 3 days ago.  He has been having increasing shortness of breath for the last week.  Patient was at the local ER in [facility].  Per the ER documentation, the patient has a pacemaker that is within 3 months of end-of-life of battery.  He had his pacemaker placed at [facility] with [facility] in January 2020.  Patient called EMS today due to worsening shortness of breath.  He states that he is normally taking 80 mg of Lasix a day.  He end up taking 360 mg of Lasix yesterday due to swollen feet.  He states that over the last year he is lost about 200 pounds.  He however does not weigh himself every day.  Nor does he check how much liquids he is drinking in a day.  He states he has never been given CHF education.  Work-up in the ER shows mildly elevated BNP 136  Chest x-ray shows mild vascular congestion without pulmonary edema  Patient denies any recent drug use.  He did get out of rehab 3 days ago.  He was using cocaine just 10 months ago.  Urine drug screen is pending.  He was given 80 mg of IV Lasix and is urinated over a liter so far.  EDP does not believe the patient can be discharged home  [facility] hospitalist contacted for admission.

## 2022-02-26 NOTE — ED Provider Notes (Signed)
[facility] EMERGENCY DEPARTMENT Provider Note   CSN: [id number] Arrival date & time: 02/26/22  0242     History  Chief Complaint  Patient presents with   Shortness of Breath   Chest Pain    Luis [name] is a 51 y.o. male.  The history is provided by the patient and the EMS personnel.  Shortness of Breath Associated symptoms: chest pain   Chest Pain Associated symptoms: shortness of breath   He has history of hypertension, diabetes, hyperlipidemia, systolic heart failure, permanent pacemaker and comes in with 3-day history of worsening shortness of breath and peripheral edema.  Today, he was unable to put his shoes on because of his feet swelling, so he took 4 times his normal dose of furosemide (320 mg), but has not noted any improvement.  He has had a vague discomfort in his chest which she would not elaborate on but he is blaming it on the battery on his pacemaker wearing down.  He denies any excessive salt intake.  EMS noted hypoxia with oxygen saturation of 91% which improved to 97% with supplemental oxygen.  He is not on home oxygen.   Home Medications Prior to Admission medications   Medication Sig Start Date End Date Taking? Authorizing Provider  buPROPion HCl ER, XL, 450 MG TB24 Take 450 mg by mouth daily. 09/03/21   Nwoko, Tommas Olp, PA  carvedilol (COREG) 6.25 MG tablet Take 1 tablet (6.25 mg total) by mouth 2 (two) times daily with a meal. 08/21/21   Clapacs, [name], MD  clopidogrel (PLAVIX) 75 MG tablet Take 1 tablet (75 mg total) by mouth daily. 08/22/21   Clapacs, [name], MD  DULoxetine (CYMBALTA) 30 MG capsule Take 3 capsules (90 mg total) by mouth daily. 09/03/21   Nwoko, Tommas Olp, PA  furosemide (LASIX) 80 MG tablet Take 1 tablet (80 mg total) by mouth daily. 08/21/21   Clapacs, [name], MD  hydrOXYzine (ATARAX) 25 MG tablet Take 1 tablet (25 mg total) by mouth 3 (three) times daily as needed for anxiety. 09/03/21   Nwoko, Tommas Olp, PA  insulin  aspart protamine- aspart (NOVOLOG MIX 70/30) (70-30) 100 UNIT/ML injection Inject 0.6 mLs (60 Units total) into the skin 3 (three) times daily after meals. 08/21/21   Clapacs, [name], MD  pantoprazole (PROTONIX) 40 MG tablet Take 1 tablet (40 mg total) by mouth daily. 08/21/21   Clapacs, [name], MD  prazosin (MINIPRESS) 2 MG capsule Take 1 capsule (2 mg total) by mouth at bedtime. 09/03/21   Nwoko, Tommas Olp, PA  rosuvastatin (CRESTOR) 20 MG tablet Take 1 tablet (20 mg total) by mouth daily. 08/22/21   Clapacs, [name], MD  traZODone (DESYREL) 100 MG tablet Take 2 tablets (200 mg total) by mouth at bedtime. 09/03/21   Nwoko, Stephens Shire E, PA  TRULICITY 1.5 MG/0.5ML SOPN Inject 1.5 mg into the skin once a week. 08/21/21   Clapacs, [name], MD      Allergies    Aspirin, Iodine-131, Iodinated contrast media, Latex, Penicillins, Tramadol, Amoxicillin-pot clavulanate, Lidocaine, and Lisinopril    Review of Systems   Review of Systems  Respiratory:  Positive for shortness of breath.   Cardiovascular:  Positive for chest pain.  All other systems reviewed and are negative.   Physical Exam Updated Vital Signs BP 126/67 (BP Location: Right Arm)   Pulse 95   Temp (!) 97.5 F (36.4 C)   Resp 17   Ht 5\' 5"  (  1.651 m)   Wt 127 kg   SpO2 93%   BMI 46.59 kg/m  Physical Exam Vitals and nursing note reviewed.   51 year old male, resting comfortably and in no acute distress. Vital signs are normal. Oxygen saturation is 99%, which is normal. Head is normocephalic and atraumatic. PERRLA, EOMI. Oropharynx is clear. Neck is nontender and supple without adenopathy or JVD. Back is nontender and there is no CVA tenderness.  There is 1+ presacral edema. Lungs fine bibasilar rales without wheezes or rhonchi. Chest is nontender. Heart has regular rate and rhythm without murmur. Abdomen is soft, flat, nontender. Extremities have 2+ pretibial and pedal edema, full range of motion is present. Skin is warm and dry  without rash. Neurologic: Mental status is normal, cranial nerves are intact, moves all extremities equally.  ED Results / Procedures / Treatments   Labs (all labs ordered are listed, but only abnormal results are displayed) Labs Reviewed - No data to display  EKG EKG Interpretation  Date/Time:  Thursday February 26 2022 02:52:32 EDT Ventricular Rate:  96 PR Interval:  158 QRS Duration: 142 QT Interval:  407 QTC Calculation: 515 R Axis:   105 Text Interpretation: Atrial-sensed ventricular-paced rhythm When compared with ECG of 08/05/2021, No significant change was found Confirmed by Delora Fuel ([id number]) on 02/26/2022 3:06:57 AM  Radiology No results found.  Procedures Procedures  Cardiac monitor shows atrial sensing, ventricular paced rhythm, per my interpretation.  Medications Ordered in ED Medications - No data to display  ED Course/ Medical Decision Making/ A&P                           Medical Decision Making Amount and/or Complexity of Data Reviewed Labs: ordered. Radiology: ordered.  Risk OTC drugs. Prescription drug management. Decision regarding hospitalization.   Shortness of breath and leg swelling which appears to be an exacerbation of known heart failure.  With chest discomfort, need to be concerned about possible concurrent ACS.  I have reviewed and interpreted his electrocardiogram and my interpretation is atrial sensed ventricular paced rhythm unchanged from prior.  I have ordered laboratory work-up of CBC, basic metabolic panel, troponin, brain natruretic protein and I have ordered a chest x-ray to evaluate for findings of heart failure.  I have ordered topical nitroglycerin and intravenous furosemide.  Aspirin is not ordered because of allergy.  Old records are reviewed, and he was in another emergency department on 11/09/2021 for heart failure exacerbation.  Echocardiogram on 01/28/2021 showed ejection fraction of 30-35% and grade 2 diastolic dysfunction.  I  have reviewed his laboratory tests and my interpretation is mild anemia which is new, borderline leukocytosis which is felt to be stress related, elevated random glucose level, mildly elevated troponin which is felt to be from demand ischemia and not ACS, mild elevation of BNP consistent with heart failure.  BNP is increased compared with his baseline.  Following furosemide, he did have significant urine output but continues to be dyspneic at rest and is not stable for discharge.  Case is discussed with Dr. Bridgett Larsson of [facility] who agrees to admit the patient.  Cardiology had been paged, but had not called back as of the time of this note..  Final Clinical Impression(s) / ED Diagnoses Final diagnoses:  Acute on chronic combined systolic and diastolic heart failure (HCC)  Normochromic normocytic anemia  Elevated troponin    Rx / DC Orders ED Discharge Orders  None         Dione Booze, MD 02/26/22 [phone]

## 2022-02-26 NOTE — Assessment & Plan Note (Signed)
Chronic.  BMI is 46.59

## 2022-02-26 NOTE — Progress Notes (Signed)
PROGRESS NOTE  Luis [name] [id number] DOB: [birth year]   PCP: Buzzy Han, MD (Inactive)  Patient is from: Home.  DOA: 02/26/2022 LOS: 0  Chief complaints Chief Complaint  Patient presents with   Shortness of Breath   Chest Pain     Brief Narrative / Interim history: 51 year old M with PMH of combined CHF, PPM, chronic hypoxic RF on 2.5 L, OSA on CPAP, morbid obesity and ongoing cocaine use disorder presenting with progressive shortness of breath for about a week despite increasing his home Lasix, and admitted for acute on chronic combined CHF.  BNP 136.  Chest x-ray consistent with CHF.  Urine is positive for cocaine.  TTE ordered.  Patient was started on IV Lasix.  Subjective: Seen and examined earlier this morning.  Continues to endorse shortness of breath.  He denies chest pain, GI or UTI symptoms.  He had about 2 L urine in the urine bag.   Objective: Vitals:   02/26/22 0810 02/26/22 1030 02/26/22 1045 02/26/22 1112  BP:  115/82 139/84   Pulse: 90 92 92   Resp: (!) 21 20 (!) 21   Temp:    97.7 F (36.5 C)  TempSrc:    Oral  SpO2: 92% 96% 95%   Weight:      Height:        Examination:  GENERAL: Sitting on bedside chair. HEENT: MMM.  Vision and hearing grossly intact.  NECK: Supple.  Diffuse Ultracef JVD. RESP:  No IWOB.  Limited air sounds bilaterally probably due to body habitus. CVS:  RRR. Heart sounds normal.  ABD/GI/GU: BS+. Abd soft, NTND.  MSK/EXT:  Moves extremities. No apparent deformity.  1+ BLE edema. SKIN: no apparent skin lesion or wound NEURO: Awake but falls asleep intermittently.  No apparent focal neuro deficit. PSYCH: Calm. Normal affect.   Procedures:  None  Microbiology summarized: None  Assessment and plan: Principal Problem:   Acute on chronic combined systolic and diastolic CHF (congestive heart failure) (HCC) Active Problems:   Cocaine abuse (HCC)   Pacemaker   HLD (hyperlipidemia)   Diabetes mellitus  without complication (HCC)   Hypertension   Tobacco abuse   Morbid obesity (HCC)   Sleep apnea   Chronic respiratory failure with hypoxia (HCC) - 2.5 L/min  Acute on chronic combined CHF: TTE in 01/2021 with LVEF of 30 to 35%, GH, G2-DD.  Presents with progressive dyspnea.  Difficult to assess fluid status due to body habitus.  BNP 136 but falsely low given morbid obesity.  CXR consistent with CHF.  This could be provoked by ongoing cocaine use although he denies despite positive UDS.  Not on optimal GDMT.  Seems he is followed by [facility] cardiology group.  Started on IV Lasix.  Had about 2.5 L UOP already.  Still with significant shortness of breath. -Continue IV Lasix 80 mg twice daily -GDMT-Farxiga and Coreg. -Follow echocardiogram. -Strict intake and output, daily weight, renal functions and electrolytes -Encouraged to avoid cocaine.   Cocaine abuse: UDS positive but he denies use stating that he was around someone who was using cocaine. -Advised to avoid.   Chronic respiratory failure with hypoxia: Stable on home 2.5 L -Continue home oxygen.   Obstructive sleep apnea -Continue CPAP.   Hypertension: Normotensive for most part. Continue Lasix.  Hold Coreg until cocaine abuse has been ruled out with urine drug screen.   IDDM-2 with hyperglycemia and HLD: Seems he is on Lantus, NovoLog and Victoza at home. Recent Labs  Lab  02/26/22 0840  GLUCAP 176*  -Start SSI-moderate -NovoLog 4 units 3 times daily -Semglee 15 units daily -Check hemoglobin A1c -Continue home Crestor.   Pacemaker: patient's pacemaker is near end-of-life according to the ER note from this month and [facility] [place].  His electrophysiologist is associate with [facility] in [place].  Patient was referred back to his electrophysiologist for generator change out.  Mood disorder: -Resume home medications after med rec.  Morbid obesity Body mass index is 46.59 kg/m. -Continue  Victoza          DVT prophylaxis:  heparin injection 5,000 Units Start: 02/26/22 0630 SCDs Start: 02/26/22 8413  Code Status: Full code Family Communication: None at bedside Level of care: Progressive Status is: Observation The patient will require care spanning > 2 midnights and should be moved to inpatient because: Acute combined CHF/fluid overload requiring IV Lasix   Final disposition: Likely home once medically stable Consultants:  None  Sch Meds:  Scheduled Meds:  dapagliflozin propanediol  10 mg Oral Daily   furosemide  80 mg Intravenous Q12H   heparin  5,000 Units Subcutaneous Q8H   insulin aspart  0-15 Units Subcutaneous TID WC   insulin aspart  0-5 Units Subcutaneous QHS   insulin aspart  4 Units Subcutaneous TID WC   insulin glargine-yfgn  15 Units Subcutaneous Daily   pantoprazole  40 mg Oral Daily   prazosin  2 mg Oral QHS   rosuvastatin  20 mg Oral Daily   sodium chloride flush  3 mL Intravenous Q12H   Continuous Infusions:  sodium chloride     PRN Meds:.sodium chloride, acetaminophen, ondansetron (ZOFRAN) IV, sodium chloride flush  Antimicrobials: Anti-infectives (From admission, onward)    None        I have personally reviewed the following labs and images: CBC: Recent Labs  Lab 02/26/22 0315  WBC 10.6*  NEUTROABS 7.1  HGB 11.7*  HCT 35.7*  MCV 89.5  PLT 249   BMP &GFR Recent Labs  Lab 02/26/22 0315  NA 139  K 3.7  CL 106  CO2 25  GLUCOSE 166*  BUN 9  CREATININE 0.87  CALCIUM 8.6*   Estimated Creatinine Clearance: 121.8 mL/min (by C-G formula based on SCr of 0.87 mg/dL). Liver & Pancreas: No results for input(s): "AST", "ALT", "ALKPHOS", "BILITOT", "PROT", "ALBUMIN" in the last 168 hours. No results for input(s): "LIPASE", "AMYLASE" in the last 168 hours. No results for input(s): "AMMONIA" in the last 168 hours. Diabetic: No results for input(s): "HGBA1C" in the last 72 hours. Recent Labs  Lab 02/26/22 0840  GLUCAP  176*   Cardiac Enzymes: No results for input(s): "CKTOTAL", "CKMB", "CKMBINDEX", "TROPONINI" in the last 168 hours. No results for input(s): "PROBNP" in the last 8760 hours. Coagulation Profile: No results for input(s): "INR", "PROTIME" in the last 168 hours. Thyroid Function Tests: No results for input(s): "TSH", "T4TOTAL", "FREET4", "T3FREE", "THYROIDAB" in the last 72 hours. Lipid Profile: No results for input(s): "CHOL", "HDL", "LDLCALC", "TRIG", "CHOLHDL", "LDLDIRECT" in the last 72 hours. Anemia Panel: No results for input(s): "VITAMINB12", "FOLATE", "FERRITIN", "TIBC", "IRON", "RETICCTPCT" in the last 72 hours. Urine analysis:    Component Value Date/Time   COLORURINE YELLOW 08/05/2021 1429   APPEARANCEUR CLEAR 08/05/2021 1429   LABSPEC 1.030 08/05/2021 1429   PHURINE 5.0 08/05/2021 1429   GLUCOSEU >=500 (A) 08/05/2021 1429   HGBUR SMALL (A) 08/05/2021 1429   BILIRUBINUR NEGATIVE 08/05/2021 1429   KETONESUR NEGATIVE 08/05/2021 1429   PROTEINUR NEGATIVE  08/05/2021 1429   NITRITE NEGATIVE 08/05/2021 1429   LEUKOCYTESUR NEGATIVE 08/05/2021 1429   Sepsis Labs: Invalid input(s): "PROCALCITONIN", "LACTICIDVEN"  Microbiology: No results found for this or any previous visit (from the past 240 hour(s)).  Radiology Studies: DG Chest Port 1 View  Result Date: 02/26/2022 CLINICAL DATA:  Shortness of breath EXAM: PORTABLE CHEST 1 VIEW COMPARISON:  08/10/2021 FINDINGS: Cardiac shadow is enlarged. Defibrillator is again noted. Lungs are well aerated bilaterally. No focal infiltrate or sizable effusion is seen. Mild central vascular congestion is noted. IMPRESSION: Mild vascular congestion without edema. Stable cardiomegaly. Electronically Signed   By: Alcide Clever M.D.   On: 02/26/2022 03:28      Luis [name] T. Joden Bonsall [facility]  If 7PM-7AM, please contact night-coverage [email or web address] 02/26/2022, 11:16 AM

## 2022-02-26 NOTE — Assessment & Plan Note (Signed)
Chronic.  As needed nicotine patch. 

## 2022-02-26 NOTE — Progress Notes (Addendum)
Heart Failure Nurse Navigator Progress Note  Interviewed and assessed for HV TOC readiness. Completed SDOH, no immediate needs noted per patient answers. Pt resting in recliner with feet down. Intermittently wears 3L O2 via [place]. Pt void during interview process. Pt not very interactive with interview, mostly yes/no answers and intermittently falling asleep. Awakens easily to voice.   Follows with Dr. Neale Burly at [facility] for cardiology care per pt statement. Pt declined HV TOC clinic appt upon DC from hospitalization. Pt educated on benefits of HV TOC. Pt education complete regarding HF patient booklet.   Education Assessment and Provision:  Detailed education and instructions provided on heart failure disease management including the following:  Signs and symptoms of Heart Failure When to call the physician Importance of daily weights Low sodium diet Fluid restriction Medication management Anticipated future follow-up appointments  Patient education given on each of the above topics.  Patient acknowledges understanding via teach back method and acceptance of all instructions.  Education Materials:  "Living Better With Heart Failure" Booklet, HF zone tool, & Daily Weight Tracker Tool.  Patient has scale at home: declines to answer.  Patient has pill box at home: declines to answer    Ozella Rocks, MSN, RN

## 2022-02-26 NOTE — Progress Notes (Signed)
Echocardiogram 2D Echocardiogram has been performed.  Lucendia Herrlich 02/26/2022, 2:27 PM

## 2022-02-26 NOTE — Progress Notes (Signed)
Pt refusing medications. Pt given education and verbalized understanding.

## 2022-02-26 NOTE — Assessment & Plan Note (Signed)
Patient with a history of polysubstance abuse including cocaine.  Check urine drug screen.  He just got out of drug and alcohol rehab 3 days ago.

## 2022-02-26 NOTE — Assessment & Plan Note (Addendum)
Continue Lasix.  Hold Coreg until cocaine abuse has been ruled out with urine drug screen.

## 2022-02-26 NOTE — Assessment & Plan Note (Signed)
Patient's pacemaker is near end-of-life according to the ER note from this month and [facility] [place].  His electrophysiologist is associate with [facility] in [place].  Patient was referred back to his electrophysiologist for generator change out.

## 2022-02-27 ENCOUNTER — Other Ambulatory Visit (HOSPITAL_COMMUNITY): Payer: Self-pay

## 2022-02-27 ENCOUNTER — Other Ambulatory Visit: Payer: Self-pay

## 2022-02-27 ENCOUNTER — Encounter (HOSPITAL_COMMUNITY): Payer: Self-pay | Admitting: Student

## 2022-02-27 ENCOUNTER — Observation Stay (HOSPITAL_COMMUNITY): Payer: Medicare Other

## 2022-02-27 DIAGNOSIS — I471 Supraventricular tachycardia: Secondary | ICD-10-CM | POA: Diagnosis not present

## 2022-02-27 DIAGNOSIS — K219 Gastro-esophageal reflux disease without esophagitis: Secondary | ICD-10-CM | POA: Diagnosis present

## 2022-02-27 DIAGNOSIS — E785 Hyperlipidemia, unspecified: Secondary | ICD-10-CM | POA: Diagnosis present

## 2022-02-27 DIAGNOSIS — Z95810 Presence of automatic (implantable) cardiac defibrillator: Secondary | ICD-10-CM | POA: Diagnosis not present

## 2022-02-27 DIAGNOSIS — Z6841 Body Mass Index (BMI) 40.0 and over, adult: Secondary | ICD-10-CM | POA: Diagnosis not present

## 2022-02-27 DIAGNOSIS — N400 Enlarged prostate without lower urinary tract symptoms: Secondary | ICD-10-CM | POA: Diagnosis present

## 2022-02-27 DIAGNOSIS — L538 Other specified erythematous conditions: Secondary | ICD-10-CM | POA: Diagnosis not present

## 2022-02-27 DIAGNOSIS — M7989 Other specified soft tissue disorders: Secondary | ICD-10-CM | POA: Diagnosis not present

## 2022-02-27 DIAGNOSIS — B3749 Other urogenital candidiasis: Secondary | ICD-10-CM | POA: Diagnosis not present

## 2022-02-27 DIAGNOSIS — I5020 Unspecified systolic (congestive) heart failure: Secondary | ICD-10-CM | POA: Diagnosis not present

## 2022-02-27 DIAGNOSIS — H9202 Otalgia, left ear: Secondary | ICD-10-CM | POA: Diagnosis present

## 2022-02-27 DIAGNOSIS — D649 Anemia, unspecified: Secondary | ICD-10-CM | POA: Diagnosis not present

## 2022-02-27 DIAGNOSIS — Z9981 Dependence on supplemental oxygen: Secondary | ICD-10-CM | POA: Diagnosis not present

## 2022-02-27 DIAGNOSIS — I428 Other cardiomyopathies: Secondary | ICD-10-CM | POA: Diagnosis not present

## 2022-02-27 DIAGNOSIS — E119 Type 2 diabetes mellitus without complications: Secondary | ICD-10-CM | POA: Diagnosis not present

## 2022-02-27 DIAGNOSIS — R52 Pain, unspecified: Secondary | ICD-10-CM | POA: Diagnosis not present

## 2022-02-27 DIAGNOSIS — I5043 Acute on chronic combined systolic (congestive) and diastolic (congestive) heart failure: Secondary | ICD-10-CM

## 2022-02-27 DIAGNOSIS — F1410 Cocaine abuse, uncomplicated: Secondary | ICD-10-CM | POA: Diagnosis not present

## 2022-02-27 DIAGNOSIS — I110 Hypertensive heart disease with heart failure: Secondary | ICD-10-CM | POA: Diagnosis not present

## 2022-02-27 DIAGNOSIS — F4310 Post-traumatic stress disorder, unspecified: Secondary | ICD-10-CM | POA: Diagnosis present

## 2022-02-27 DIAGNOSIS — F32A Depression, unspecified: Secondary | ICD-10-CM | POA: Diagnosis not present

## 2022-02-27 DIAGNOSIS — G4733 Obstructive sleep apnea (adult) (pediatric): Secondary | ICD-10-CM | POA: Diagnosis present

## 2022-02-27 DIAGNOSIS — Z794 Long term (current) use of insulin: Secondary | ICD-10-CM | POA: Diagnosis not present

## 2022-02-27 DIAGNOSIS — E0781 Sick-euthyroid syndrome: Secondary | ICD-10-CM | POA: Diagnosis present

## 2022-02-27 DIAGNOSIS — J9611 Chronic respiratory failure with hypoxia: Secondary | ICD-10-CM | POA: Diagnosis not present

## 2022-02-27 DIAGNOSIS — I2510 Atherosclerotic heart disease of native coronary artery without angina pectoris: Secondary | ICD-10-CM | POA: Diagnosis present

## 2022-02-27 DIAGNOSIS — E1165 Type 2 diabetes mellitus with hyperglycemia: Secondary | ICD-10-CM | POA: Diagnosis not present

## 2022-02-27 LAB — CBC
HCT: 38.2 % — ABNORMAL LOW (ref 39.0–52.0)
Hemoglobin: 12.5 g/dL — ABNORMAL LOW (ref 13.0–17.0)
MCH: 29 pg (ref 26.0–34.0)
MCHC: 32.7 g/dL (ref 30.0–36.0)
MCV: 88.6 fL (ref 80.0–100.0)
Platelets: 261 10*3/uL (ref 150–400)
RBC: 4.31 MIL/uL (ref 4.22–5.81)
RDW: 13.3 % (ref 11.5–15.5)
WBC: 9.2 10*3/uL (ref 4.0–10.5)
nRBC: 0 % (ref 0.0–0.2)

## 2022-02-27 LAB — ECHOCARDIOGRAM COMPLETE
AR max vel: 2.43 cm2
AV Area VTI: 2.22 cm2
AV Area mean vel: 2.4 cm2
AV Mean grad: 4 mmHg
AV Peak grad: 7 mmHg
Ao pk vel: 1.32 m/s
Area-P 1/2: 6.43 cm2
Height: 65.5 in
MV M vel: 2.13 m/s
MV Peak grad: 18.1 mmHg
S' Lateral: 4.65 cm
Weight: 4772.8 oz

## 2022-02-27 LAB — COMPREHENSIVE METABOLIC PANEL
ALT: 20 U/L (ref 0–44)
AST: 22 U/L (ref 15–41)
Albumin: 3.4 g/dL — ABNORMAL LOW (ref 3.5–5.0)
Alkaline Phosphatase: 45 U/L (ref 38–126)
Anion gap: 12 (ref 5–15)
BUN: 13 mg/dL (ref 6–20)
CO2: 28 mmol/L (ref 22–32)
Calcium: 8.9 mg/dL (ref 8.9–10.3)
Chloride: 99 mmol/L (ref 98–111)
Creatinine, Ser: 1.01 mg/dL (ref 0.61–1.24)
GFR, Estimated: >60 mL/min (ref >60)
Glucose, Bld: 140 mg/dL — ABNORMAL HIGH (ref 70–99)
Potassium: 3.2 mmol/L — ABNORMAL LOW (ref 3.5–5.1)
Sodium: 139 mmol/L (ref 135–145)
Total Bilirubin: 0.5 mg/dL (ref 0.3–1.2)
Total Protein: 7 g/dL (ref 6.5–8.1)

## 2022-02-27 LAB — GLUCOSE, CAPILLARY
Glucose-Capillary: 144 mg/dL — ABNORMAL HIGH (ref 70–99)
Glucose-Capillary: 83 mg/dL (ref 70–99)
Glucose-Capillary: 182 mg/dL — ABNORMAL HIGH (ref 70–99)
Glucose-Capillary: 185 mg/dL — ABNORMAL HIGH (ref 70–99)

## 2022-02-27 LAB — MAGNESIUM: Magnesium: 2.1 mg/dL (ref 1.7–2.4)

## 2022-02-27 LAB — PHOSPHORUS: Phosphorus: 5.2 mg/dL — ABNORMAL HIGH (ref 2.5–4.6)

## 2022-02-27 LAB — BRAIN NATRIURETIC PEPTIDE: B Natriuretic Peptide: 100.7 pg/mL — ABNORMAL HIGH (ref 0.0–100.0)

## 2022-02-27 LAB — TROPONIN I (HIGH SENSITIVITY): Troponin I (High Sensitivity): 19 ng/L — ABNORMAL HIGH (ref <18)

## 2022-02-27 MED ORDER — CARVEDILOL 6.25 MG PO TABS
6.2500 mg | ORAL_TABLET | Freq: Two times a day (BID) | ORAL | Status: DC
  Administered 2022-02-27 – 2022-03-04 (×10): 6.25 mg via ORAL
  Filled 2022-02-27 (×11): qty 1

## 2022-02-27 MED ORDER — FUROSEMIDE 10 MG/ML IJ SOLN
80.0000 mg | Freq: Every day | INTRAMUSCULAR | Status: AC
  Administered 2022-02-28 – 2022-03-02 (×3): 80 mg via INTRAVENOUS
  Filled 2022-02-27 (×3): qty 8

## 2022-02-27 MED ORDER — DULOXETINE HCL 60 MG PO CPEP
90.0000 mg | ORAL_CAPSULE | Freq: Every day | ORAL | Status: DC
  Administered 2022-02-27 – 2022-03-08 (×10): 90 mg via ORAL
  Filled 2022-02-27 (×10): qty 1

## 2022-02-27 MED ORDER — CLOPIDOGREL BISULFATE 75 MG PO TABS
75.0000 mg | ORAL_TABLET | Freq: Every day | ORAL | Status: DC
  Administered 2022-02-27 – 2022-03-08 (×10): 75 mg via ORAL
  Filled 2022-02-27 (×10): qty 1

## 2022-02-27 MED ORDER — POTASSIUM CHLORIDE CRYS ER 20 MEQ PO TBCR
40.0000 meq | EXTENDED_RELEASE_TABLET | ORAL | Status: AC
  Administered 2022-02-27 (×2): 40 meq via ORAL
  Filled 2022-02-27 (×2): qty 2

## 2022-02-27 MED ORDER — NEOMYCIN-POLYMYXIN-HC 3.5-10000-1 OT SUSP
4.0000 [drp] | Freq: Four times a day (QID) | OTIC | Status: DC
Start: 2022-02-27 — End: 2022-03-08
  Administered 2022-02-27 – 2022-03-08 (×35): 4 [drp] via OTIC
  Filled 2022-02-27: qty 0.2

## 2022-02-27 MED ORDER — TRAZODONE HCL 100 MG PO TABS
200.0000 mg | ORAL_TABLET | Freq: Every day | ORAL | Status: DC
  Administered 2022-02-27 – 2022-03-07 (×9): 200 mg via ORAL
  Filled 2022-02-27 (×9): qty 2

## 2022-02-27 NOTE — Progress Notes (Addendum)
PROGRESS NOTE  Luis [name]:[id number] DOB: September 24, 1970   PCP: Margot Ables, MD (Inactive)  Patient is from: Home.  DOA: 02/26/2022 LOS: 0  Chief complaints Chief Complaint  Patient presents with   Shortness of Breath   Chest Pain     Brief Narrative / Interim history: 51 year old M with PMH of combined CHF, PPM, chronic hypoxic RF on 2.5 L, OSA on CPAP, morbid obesity and ongoing cocaine use disorder presenting with progressive shortness of breath for about a week despite increasing his home Lasix, and admitted for acute on chronic combined CHF.  BNP 136.  Chest x-ray consistent with CHF.  Urine is positive for cocaine.  TTE ordered.  Patient was started on IV Lasix.  Subjective: Seen and examined earlier this morning.  No major events overnight of this morning.  Continues to endorse shortness of breath and DOE but noted some improvement.  Had about 3.3 L UOP/24 hours.  Net -2.5 L.  Also reports left ear pain.  He reports recent ear infection for which he was treated with eardrops and oral antibiotics.  He denies chest pain, GI or UTI symptoms.  Objective: Vitals:   02/26/22 1949 02/26/22 2324 02/26/22 2357 02/27/22 0510  BP: 115/63 122/67  (!) 141/60  Pulse: 100 84  88  Resp: 20 17 17 14   Temp: 98.6 F (37 C) (!) 97.5 F (36.4 C) (!) 97.5 F (36.4 C) 97.6 F (36.4 C)  TempSrc: Oral  Oral Oral  SpO2: 94%   96%  Weight:    135.3 kg  Height:        Examination:  GENERAL: No apparent distress.  Nontoxic.  Seems to have conversational dyspnea. HEENT: MMM.  Vision and hearing grossly intact.  NECK: Supple.  Difficult to assess JVD due to body habitus. RESP:  No IWOB.  Diminished aeration bilaterally partly due to body habitus. CVS:  RRR. Heart sounds normal.  ABD/GI/GU: BS+. Abd soft, NTND.  MSK/EXT:  Moves extremities. No apparent deformity.  1+ BLE edema. SKIN: no apparent skin lesion or wound NEURO: Awake and alert. Oriented appropriately.  No  apparent focal neuro deficit. PSYCH: Calm. Normal affect.   Procedures:  None  Microbiology summarized: None  Assessment and plan: Principal Problem:   Acute on chronic combined systolic and diastolic CHF (congestive heart failure) (HCC) Active Problems:   Cocaine abuse (HCC)   Pacemaker   HLD (hyperlipidemia)   Diabetes mellitus without complication (HCC)   Hypertension   Tobacco abuse   Morbid obesity (HCC)   Sleep apnea   Chronic respiratory failure with hypoxia (HCC) - 2.5 L/min  Acute on chronic combined CHF: TTE in 01/2021 with LVEF of 30 to 35%, GH, G2-DD.  Presents with progressive dyspnea.  Difficult to assess fluid status due to body habitus.  BNP 136 but falsely low given morbid obesity.  CXR consistent with CHF.  This could be provoked by ongoing cocaine use although he denies despite positive UDS.  Not on optimal GDMT.  Seems he is followed by [facility] cardiology group.  Started on IV Lasix.  Had about 3.3 L UOP/24 hours.  Net -2.5 L.  Still with significant shortness of breath. -Continue IV Lasix and decreased to 80 mg daily given rising creatinine -GDMT-Farxiga and Coreg.  Entresto prior to discharge. -Follow echocardiogram. -Strict intake and output, daily weight, renal functions and electrolytes -Encouraged to avoid cocaine.   Cocaine abuse: UDS positive but he denies use stating that he was around someone who was  using cocaine. -Advised to avoid.   Chronic respiratory failure with hypoxia: Stable on home 2.5 L -Continue home oxygen.   Obstructive sleep apnea -Continue CPAP at night.   Hypertension: Normotensive for most part. Continue Lasix.  Hold Coreg until cocaine abuse has been ruled out with urine drug screen.   IDDM-2 with hyperglycemia and HLD: Seems he is on Lantus, NovoLog and Victoza at home.  A1c 9.1%. Recent Labs  Lab 02/26/22 1253 02/26/22 1612 02/26/22 2200 02/27/22 0557 02/27/22 1152  GLUCAP 108* 145* 252* 144* 83  -Start  SSI-moderate -NovoLog 4 units 3 times daily -Semglee 15 units daily -Continue home Crestor.   Pacemaker: patient's pacemaker is near end-of-life according to the ER note from this month and [facility] [place].  His electrophysiologist is associate with [facility] in [place].  Patient was referred back to his electrophysiologist for generator change out.  Mood disorder: -Resume home medications after med rec.  Hypokalemia/hyperphosphatemia: -Monitor replenish as appropriate  Left ear pain: Recently treated for infection with eardrops and oral antibiotics.  Was not able to do otoscopic exam since all autoscope's are out of charge. -Will reexamine this afternoon  Morbid obesity Body mass index is 48.88 kg/m. -Continue Victoza on discharge          DVT prophylaxis:  heparin injection 5,000 Units Start: 02/26/22 0630 SCDs Start: 02/26/22 0272  Code Status: Full code Family Communication: None at bedside Level of care: Telemetry Cardiac Status is: Observation The patient will require care spanning > 2 midnights and should be moved to inpatient because: Acute combined CHF/fluid overload requiring IV Lasix   Final disposition: Likely home in the next 24 to 48 hours Consultants:  None  Sch Meds:  Scheduled Meds:  carvedilol  6.25 mg Oral BID WC   dapagliflozin propanediol  10 mg Oral Daily   [START ON 02/28/2022] furosemide  80 mg Intravenous Daily   heparin  5,000 Units Subcutaneous Q8H   insulin aspart  0-15 Units Subcutaneous TID WC   insulin aspart  0-5 Units Subcutaneous QHS   insulin aspart  4 Units Subcutaneous TID WC   insulin glargine-yfgn  15 Units Subcutaneous Daily   pantoprazole  40 mg Oral Daily   potassium chloride  40 mEq Oral Q4H   prazosin  2 mg Oral QHS   rosuvastatin  20 mg Oral Daily   sodium chloride flush  3 mL Intravenous Q12H   Continuous Infusions:  sodium chloride     PRN Meds:.sodium chloride, acetaminophen,  ipratropium-albuterol, ondansetron (ZOFRAN) IV, sodium chloride flush  Antimicrobials: Anti-infectives (From admission, onward)    None        I have personally reviewed the following labs and images: CBC: Recent Labs  Lab 02/26/22 0315 02/27/22 0450  WBC 10.6* 9.2  NEUTROABS 7.1  --   HGB 11.7* 12.5*  HCT 35.7* 38.2*  MCV 89.5 88.6  PLT 249 261   BMP &GFR Recent Labs  Lab 02/26/22 0315 02/27/22 0450  NA 139 139  K 3.7 3.2*  CL 106 99  CO2 25 28  GLUCOSE 166* 140*  BUN 9 13  CREATININE 0.87 1.01  CALCIUM 8.6* 8.9  MG  --  2.1  PHOS  --  5.2*   Estimated Creatinine Clearance: 109.7 mL/min (by C-G formula based on SCr of 1.01 mg/dL). Liver & Pancreas: Recent Labs  Lab 02/27/22 0450  AST 22  ALT 20  ALKPHOS 45  BILITOT 0.5  PROT 7.0  ALBUMIN 3.4*  No results for input(s): "LIPASE", "AMYLASE" in the last 168 hours. No results for input(s): "AMMONIA" in the last 168 hours. Diabetic: Recent Labs    02/26/22 1300  HGBA1C 9.1*   Recent Labs  Lab 02/26/22 1253 02/26/22 1612 02/26/22 2200 02/27/22 0557 02/27/22 1152  GLUCAP 108* 145* 252* 144* 83   Cardiac Enzymes: No results for input(s): "CKTOTAL", "CKMB", "CKMBINDEX", "TROPONINI" in the last 168 hours. No results for input(s): "PROBNP" in the last 8760 hours. Coagulation Profile: No results for input(s): "INR", "PROTIME" in the last 168 hours. Thyroid Function Tests: No results for input(s): "TSH", "T4TOTAL", "FREET4", "T3FREE", "THYROIDAB" in the last 72 hours. Lipid Profile: No results for input(s): "CHOL", "HDL", "LDLCALC", "TRIG", "CHOLHDL", "LDLDIRECT" in the last 72 hours. Anemia Panel: No results for input(s): "VITAMINB12", "FOLATE", "FERRITIN", "TIBC", "IRON", "RETICCTPCT" in the last 72 hours. Urine analysis:    Component Value Date/Time   COLORURINE YELLOW 08/05/2021 1429   APPEARANCEUR CLEAR 08/05/2021 1429   LABSPEC 1.030 08/05/2021 1429   PHURINE 5.0 08/05/2021 1429    GLUCOSEU >=500 (A) 08/05/2021 1429   HGBUR SMALL (A) 08/05/2021 1429   BILIRUBINUR NEGATIVE 08/05/2021 1429   KETONESUR NEGATIVE 08/05/2021 1429   PROTEINUR NEGATIVE 08/05/2021 1429   NITRITE NEGATIVE 08/05/2021 1429   LEUKOCYTESUR NEGATIVE 08/05/2021 1429   Sepsis Labs: Invalid input(s): "PROCALCITONIN", "LACTICIDVEN"  Microbiology: No results found for this or any previous visit (from the past 240 hour(s)).  Radiology Studies: No results found.    Evangelos Paulino T. Roscoe  If 7PM-7AM, please contact night-coverage [email or web address] 02/27/2022, 1:15 PM

## 2022-02-27 NOTE — Evaluation (Signed)
Physical Therapy Evaluation Patient Details Name: Luis [name] MRN: [id number] DOB: 09-12-1970 Today's Date: 02/27/2022  History of Present Illness  Pt is a 51 y/o male admitted secondary to SOB. Thought to be secondary to CHF. PMH includes pacemaker, DM, tobacco use, HTN and substance abuse.  Clinical Impression  Pt admitted secondary to problem above with deficits below. Pt requiring supervision to stand at EOB. REporting increased lightheadedness, and requesting to defer further mobility. BP WFL. Pt also reporting numbness in R toes and feet. Notified RN. Anticipate pt will progress well. Recommending HHPT at d/c to address mobility deficits. Will continue to follow acutely.        Recommendations for follow up therapy are one component of a multi-disciplinary discharge planning process, led by the attending physician.  Recommendations may be updated based on patient status, additional functional criteria and insurance authorization.  Follow Up Recommendations Home health PT      Assistance Recommended at Discharge Intermittent Supervision/Assistance  Patient can return home with the following  Assistance with cooking/housework    Equipment Recommendations None recommended by PT  Recommendations for Other Services       Functional Status Assessment Patient has had a recent decline in their functional status and demonstrates the ability to make significant improvements in function in a reasonable and predictable amount of time.     Precautions / Restrictions Precautions Precautions: Fall Restrictions Weight Bearing Restrictions: No      Mobility  Bed Mobility Overal bed mobility: Needs Assistance Bed Mobility: Supine to Sit, Sit to Supine     Supine to sit: Supervision Sit to supine: Supervision   General bed mobility comments: supervision for safety.    Transfers Overall transfer level: Needs assistance Equipment used: None Transfers: Sit to/from Stand Sit  to Stand: Supervision           General transfer comment: Supervision for safety. Pt reporting lightheadedness, and wanting to defer further mobility. BP WFL    Ambulation/Gait                  Stairs            Wheelchair Mobility    Modified Rankin (Stroke Patients Only)       Balance Overall balance assessment: Needs assistance Sitting-balance support: No upper extremity supported, Feet supported Sitting balance-Leahy Scale: Good     Standing balance support: No upper extremity supported Standing balance-Leahy Scale: Fair                               Pertinent Vitals/Pain Pain Assessment Pain Assessment: No/denies pain    Home Living Family/patient expects to be discharged to:: Private residence Living Arrangements: Alone Available Help at Discharge: Other (Comment) (reports no one) Type of Home: Apartment Home Access: Level entry       Home Layout: One level Home Equipment: Rollator (4 wheels)      Prior Function Prior Level of Function : Independent/Modified Independent             Mobility Comments: Uses rollator for mobility       Hand Dominance        Extremity/Trunk Assessment   Upper Extremity Assessment Upper Extremity Assessment: Defer to OT evaluation    Lower Extremity Assessment Lower Extremity Assessment: Generalized weakness;RLE deficits/detail RLE Deficits / Details: reporting numbness in R foot and toes    Cervical / Trunk Assessment Cervical / Trunk Assessment: Other  exceptions Cervical / Trunk Exceptions: increased body habitus  Communication   Communication: No difficulties  Cognition Arousal/Alertness: Awake/alert Behavior During Therapy: WFL for tasks assessed/performed Overall Cognitive Status: No family/caregiver present to determine baseline cognitive functioning                                          General Comments      Exercises     Assessment/Plan     PT Assessment Patient needs continued PT services  PT Problem List Decreased strength;Decreased activity tolerance;Decreased balance;Decreased mobility;Decreased knowledge of use of DME;Decreased knowledge of precautions       PT Treatment Interventions DME instruction;Gait training;Stair training;Therapeutic activities;Functional mobility training;Balance training;Therapeutic exercise;Patient/family education    PT Goals (Current goals can be found in the Care Plan section)  Acute Rehab PT Goals Patient Stated Goal: to figure out why his toes are numb PT Goal Formulation: With patient Time For Goal Achievement: 03/13/22 Potential to Achieve Goals: Fair    Frequency Min 3X/week     Co-evaluation               AM-PAC PT "6 Clicks" Mobility  Outcome Measure Help needed turning from your back to your side while in a flat bed without using bedrails?: None Help needed moving from lying on your back to sitting on the side of a flat bed without using bedrails?: A Little Help needed moving to and from a bed to a chair (including a wheelchair)?: A Little Help needed standing up from a chair using your arms (e.g., wheelchair or bedside chair)?: A Little Help needed to walk in hospital room?: A Little Help needed climbing 3-5 steps with a railing? : A Lot 6 Click Score: 18    End of Session   Activity Tolerance: Treatment limited secondary to medical complications (Comment) (lightheadedness) Patient left: in bed;with call bell/phone within reach Nurse Communication: Mobility status;Other (comment) (pt requesting drink) PT Visit Diagnosis: Unsteadiness on feet (R26.81);Muscle weakness (generalized) (M62.81);Difficulty in walking, not elsewhere classified (R26.2)    Time: 7209-4709 PT Time Calculation (min) (ACUTE ONLY): 24 min   Charges:   PT Evaluation $PT Eval Moderate Complexity: 1 Mod PT Treatments $Therapeutic Activity: 8-22 mins        Cindee Salt, DPT  Acute  Rehabilitation Services  Office: [phone]   Lehman Prom 02/27/2022, 3:41 PM

## 2022-02-27 NOTE — Discharge Instructions (Signed)

## 2022-02-27 NOTE — Progress Notes (Addendum)
Patient endorses L ear pain. States that he recently had a ruptured ear drum and infection. States that ear infection was treated with oral antibiotics and ear drops that he completed one week ago but symptoms have not resolved. Endorses intermittent fevers, chills, dizziness, headaches and drainage.Denies chills, dizziness, headache or drainage at present. VSS throughout shift.

## 2022-02-27 NOTE — Progress Notes (Signed)
Patient complaining of left ear pain and states he has had medication/antibiotics for it but it's not getting better. Please advise if necessary.

## 2022-02-27 NOTE — TOC Progression Note (Addendum)
Transition of Care [facility]) - Progression Note    Patient Details  Name: Luis [name] MRN: [id number] Date of Birth: 07/01/1970  Transition of Care [facility]) CM/SW Contact  Leone Haven, RN Phone Number: 02/27/2022, 5:20 PM  Clinical Narrative:    rom home alone.  sob, chf,  conts on lasix iv, refused echo yesterday, reordered again today.  Needs HHPT set up. TOC following.        Expected Discharge Plan and Services                                                 Social Determinants of Health (SDOH) Interventions Food Insecurity Interventions: Intervention Not Indicated Housing Interventions: Intervention Not Indicated Transportation Interventions: Intervention Not Indicated Financial Strain Interventions: Intervention Not Indicated  Readmission Risk Interventions    01/31/2021   12:26 PM 01/31/2021    9:25 AM  Readmission Risk Prevention Plan  Transportation Screening Complete   PCP or Specialist Appt within 3-5 Days Complete Complete  HRI or Home Care Consult Complete Complete  Social Work Consult for Recovery Care Planning/Counseling Patient refused Complete  Palliative Care Screening Not Applicable Not Applicable  Medication Review Oceanographer) Complete Referral to Pharmacy

## 2022-02-27 NOTE — Progress Notes (Signed)
Patient has been rude to staff-calling staff names and using profanity. Patient asked to switch nurses tonight. Patient knows to be mindful of his words.

## 2022-02-27 NOTE — Progress Notes (Signed)
Nutrition Brief Note  Consult received for diet education. Patient with PMH of combined CHF, PPM, T2DM, morbid obesity and ongoing cocaine use disorder.   "Heart Healthy, Consistent Carbohydrate Nutrition Therapy" handout added to AVS.   Wt Readings from Last 15 Encounters:  02/27/22 135.3 kg  11/15/20 117.9 kg  10/09/20 123 kg  10/02/20 123 kg  07/31/20 121.1 kg  07/27/19 134.7 kg  05/04/19 (!) 140.2 kg  03/25/19 (!) 145.2 kg  01/20/19 132.5 kg   Lab Results  Component Value Date/Time   Hgb A1c MFr Bld 9.1 (H) 02/26/2022 1300   Glucose, Bld 140 (H) 02/27/2022 0450    Body mass index is 48.88 kg/m. Patient meets criteria for morbid obesity based on current BMI.   Current diet order is Heart Healthy/Carb Modified, patient is consuming approximately 100% of meals at this time. Labs and medications reviewed.   If further nutrition issues arise, please re-consult RD.   Drusilla Kanner, RDN, LDN Clinical Nutrition

## 2022-02-28 DIAGNOSIS — I5043 Acute on chronic combined systolic (congestive) and diastolic (congestive) heart failure: Secondary | ICD-10-CM | POA: Diagnosis not present

## 2022-02-28 DIAGNOSIS — E119 Type 2 diabetes mellitus without complications: Secondary | ICD-10-CM | POA: Diagnosis not present

## 2022-02-28 DIAGNOSIS — J9611 Chronic respiratory failure with hypoxia: Secondary | ICD-10-CM | POA: Diagnosis not present

## 2022-02-28 DIAGNOSIS — Z8679 Personal history of other diseases of the circulatory system: Secondary | ICD-10-CM

## 2022-02-28 DIAGNOSIS — F1410 Cocaine abuse, uncomplicated: Secondary | ICD-10-CM | POA: Diagnosis not present

## 2022-02-28 LAB — GLUCOSE, CAPILLARY
Glucose-Capillary: 199 mg/dL — ABNORMAL HIGH (ref 70–99)
Glucose-Capillary: 221 mg/dL — ABNORMAL HIGH (ref 70–99)
Glucose-Capillary: 232 mg/dL — ABNORMAL HIGH (ref 70–99)
Glucose-Capillary: 239 mg/dL — ABNORMAL HIGH (ref 70–99)

## 2022-02-28 LAB — RENAL FUNCTION PANEL
Albumin: 3.4 g/dL — ABNORMAL LOW (ref 3.5–5.0)
Anion gap: 11 (ref 5–15)
BUN: 18 mg/dL (ref 6–20)
CO2: 27 mmol/L (ref 22–32)
Calcium: 9.2 mg/dL (ref 8.9–10.3)
Chloride: 103 mmol/L (ref 98–111)
Creatinine, Ser: 1.05 mg/dL (ref 0.61–1.24)
GFR, Estimated: >60 mL/min (ref >60)
Glucose, Bld: 250 mg/dL — ABNORMAL HIGH (ref 70–99)
Phosphorus: 4.9 mg/dL — ABNORMAL HIGH (ref 2.5–4.6)
Potassium: 4.2 mmol/L (ref 3.5–5.1)
Sodium: 141 mmol/L (ref 135–145)

## 2022-02-28 LAB — CBC
HCT: 37.9 % — ABNORMAL LOW (ref 39.0–52.0)
Hemoglobin: 12.7 g/dL — ABNORMAL LOW (ref 13.0–17.0)
MCH: 29.3 pg (ref 26.0–34.0)
MCHC: 33.5 g/dL (ref 30.0–36.0)
MCV: 87.3 fL (ref 80.0–100.0)
Platelets: 258 10*3/uL (ref 150–400)
RBC: 4.34 MIL/uL (ref 4.22–5.81)
RDW: 13.2 % (ref 11.5–15.5)
WBC: 7.6 10*3/uL (ref 4.0–10.5)
nRBC: 0 % (ref 0.0–0.2)

## 2022-02-28 LAB — MAGNESIUM: Magnesium: 2.3 mg/dL (ref 1.7–2.4)

## 2022-02-28 LAB — TROPONIN I (HIGH SENSITIVITY)
Troponin I (High Sensitivity): 20 ng/L — ABNORMAL HIGH (ref <18)
Troponin I (High Sensitivity): 14 ng/L (ref <18)
Troponin I (High Sensitivity): 15 ng/L (ref <18)

## 2022-02-28 LAB — BRAIN NATRIURETIC PEPTIDE: B Natriuretic Peptide: 87.4 pg/mL (ref 0.0–100.0)

## 2022-02-28 MED ORDER — LABETALOL HCL 5 MG/ML IV SOLN: 10.0000 mg | INTRAVENOUS | Status: DC | PRN

## 2022-02-28 MED ORDER — LOSARTAN POTASSIUM 25 MG PO TABS
25.0000 mg | ORAL_TABLET | Freq: Every day | ORAL | Status: DC
  Administered 2022-02-28 – 2022-03-03 (×4): 25 mg via ORAL
  Filled 2022-02-28 (×4): qty 1

## 2022-02-28 NOTE — Progress Notes (Signed)
PROGRESS NOTE  Luis [name] DOB: [birth year]   PCP: Buzzy Han, MD (Inactive)  Patient is from: Home.  DOA: 02/26/2022 LOS: 1  Chief complaints Chief Complaint  Patient presents with   Shortness of Breath   Chest Pain     Brief Narrative / Interim history: 51 year old M with PMH of combined CHF, PPM, chronic hypoxic RF on 2.5 L, OSA on CPAP, morbid obesity and ongoing cocaine use disorder presenting with progressive shortness of breath for about a week despite increasing his home Lasix, and admitted for acute on chronic combined CHF.  BNP 136.  Chest x-ray consistent with CHF.  Urine is positive for cocaine.  TTE ordered.  Patient was started on IV Lasix.  TTE with LVEF of 25% (30 to 35% in 2022)., GH but not able to assess RWMA, diastolic function.  Cardiology consulted.   Subjective: Seen and examined earlier this morning.  No major events overnight or this morning.  Still with some shortness of breath but feels better.  No other complaints.  Denies chest pain, GI or UTI symptoms.  Objective: Vitals:   02/28/22 0542 02/28/22 0600 02/28/22 0800 02/28/22 0832  BP: 128/82   123/77  Pulse: 88   (!) 115  Resp: 14 (!) 22 17   Temp: 97.6 F (36.4 C)   98.2 F (36.8 C)  TempSrc: Oral   Oral  SpO2: 95%   93%  Weight: 135.2 kg     Height:        Examination:  GENERAL: No apparent distress.  Nontoxic. HEENT: MMM.  Vision and hearing grossly intact.  NECK: Supple.  Difficult to assess JVD due to body habitus. RESP:  No IWOB.  Fair aeration bilaterally but limited exam due to obesity. CVS:  RRR. Heart sounds normal.  ABD/GI/GU: BS+. Abd soft, NTND.  MSK/EXT:  Moves extremities. No apparent deformity. No edema.  SKIN: no apparent skin lesion or wound NEURO: Awake and alert. Oriented appropriately.  No apparent focal neuro deficit. PSYCH: Calm. Normal affect.   Procedures:  None  Microbiology summarized: None  Assessment and  plan: Principal Problem:   Acute on chronic combined systolic and diastolic CHF (congestive heart failure) (HCC) Active Problems:   Cocaine abuse (HCC)   Pacemaker   HLD (hyperlipidemia)   Diabetes mellitus without complication (HCC)   Hypertension   Tobacco abuse   Morbid obesity (HCC)   Sleep apnea   Chronic respiratory failure with hypoxia (HCC) - 2.5 L/min  Acute on chronic combined CHF: TTE with LVEF of 25% and global hypokinesis.  In 01/2021 with LVEF of 30 to 35%, GH, G2-DD.  Presents with progressive dyspnea.  Difficult to assess fluid status due to body habitus.  BNP 136>> 87.  CXR consistent with CHF.  This could be provoked by ongoing cocaine use although he denies despite positive UDS.  Not on optimal GDMT.  Seems he is followed by [facility] cardiology group.  Started on IV Lasix.  Net -5 L so far.  Creatinine is stable.  Still with significant shortness of breath. -Cardiology consulted.  Appreciate help. -Continue IV Lasix  -GDMT-Farxiga and Coreg.  Cardiology planning to add Entresto -Strict intake and output, daily weight, renal functions and electrolytes -Encouraged to avoid cocaine.  History of ventricular tachycardia/PPM: PPM is approaching end of battery lif, will need generator change by primary EP cardiology at [facility] upon discharge.   Cocaine abuse: UDS positive but he denies use stating that he was around someone who was  using cocaine. -Advised to stop.   Chronic respiratory failure with hypoxia: Stable on home 2.5 L -Continue home oxygen.   Obstructive sleep apnea -Continue CPAP at night.   Hypertension: Normotensive for most part. -Cardiac meds as above.   IDDM-2 with hyperglycemia and HLD: Seems he is on Lantus, NovoLog and Victoza at home.  A1c 9.1%. Recent Labs  Lab 02/27/22 1152 02/27/22 1625 02/27/22 2054 02/28/22 0554 02/28/22 1145  GLUCAP 83 182* 185* 199* 221*  -Change SSI to resistant scale -Increase NovoLog from 4 to 6 units 3 times daily  with meals -Continue Semglee 15 units daily -Continue home Crestor.  Mood disorder: -Resume home medications after med rec.  Hypokalemia/hyperphosphatemia: -Monitor replenish as appropriate  Left ear pain: Patient has small tissue growth with small pus looking tip over posterior wall of external ear canal on otoscopic exam. -Resumed home antibiotic drop  Morbid obesity Body mass index is 48.85 kg/m. -Continue Victoza on discharge          DVT prophylaxis:  heparin injection 5,000 Units Start: 02/26/22 0630 SCDs Start: 02/26/22 0263  Code Status: Full code Family Communication: None at bedside Level of care: Telemetry Cardiac Status is: Inpatient The patient will remain inpatient because: Acute combined CHF/fluid overload requiring IV Lasix   Final disposition: Likely home once cleared by consultants Consultants:  Cardiology  Sch Meds:  Scheduled Meds:  carvedilol  6.25 mg Oral BID WC   clopidogrel  75 mg Oral Daily   dapagliflozin propanediol  10 mg Oral Daily   DULoxetine  90 mg Oral Daily   furosemide  80 mg Intravenous Daily   heparin  5,000 Units Subcutaneous Q8H   insulin aspart  0-15 Units Subcutaneous TID WC   insulin aspart  0-5 Units Subcutaneous QHS   insulin aspart  4 Units Subcutaneous TID WC   insulin glargine-yfgn  15 Units Subcutaneous Daily   losartan  25 mg Oral Daily   neomycin-polymyxin-hydrocortisone  4 drop Left EAR QID   pantoprazole  40 mg Oral Daily   prazosin  2 mg Oral QHS   rosuvastatin  20 mg Oral Daily   sodium chloride flush  3 mL Intravenous Q12H   traZODone  200 mg Oral QHS   Continuous Infusions:  sodium chloride     PRN Meds:.sodium chloride, acetaminophen, ipratropium-albuterol, ondansetron (ZOFRAN) IV, sodium chloride flush  Antimicrobials: Anti-infectives (From admission, onward)    None        I have personally reviewed the following labs and images: CBC: Recent Labs  Lab 02/26/22 0315 02/27/22 0450  02/28/22 0308  WBC 10.6* 9.2 7.6  NEUTROABS 7.1  --   --   HGB 11.7* 12.5* 12.7*  HCT 35.7* 38.2* 37.9*  MCV 89.5 88.6 87.3  PLT 249 261 258   BMP &GFR Recent Labs  Lab 02/26/22 0315 02/27/22 0450 02/28/22 0308  NA 139 139 141  K 3.7 3.2* 4.2  CL 106 99 103  CO2 25 28 27   GLUCOSE 166* 140* 250*  BUN 9 13 18   CREATININE 0.87 1.01 1.05  CALCIUM 8.6* 8.9 9.2  MG  --  2.1 2.3  PHOS  --  5.2* 4.9*   Estimated Creatinine Clearance: 105.5 mL/min (by C-G formula based on SCr of 1.05 mg/dL). Liver & Pancreas: Recent Labs  Lab 02/27/22 0450 02/28/22 0308  AST 22  --   ALT 20  --   ALKPHOS 45  --   BILITOT 0.5  --   PROT 7.0  --  ALBUMIN 3.4* 3.4*   No results for input(s): "LIPASE", "AMYLASE" in the last 168 hours. No results for input(s): "AMMONIA" in the last 168 hours. Diabetic: Recent Labs    02/26/22 1300  HGBA1C 9.1*   Recent Labs  Lab 02/27/22 1152 02/27/22 1625 02/27/22 2054 02/28/22 0554 02/28/22 1145  GLUCAP 83 182* 185* 199* 221*   Cardiac Enzymes: No results for input(s): "CKTOTAL", "CKMB", "CKMBINDEX", "TROPONINI" in the last 168 hours. No results for input(s): "PROBNP" in the last 8760 hours. Coagulation Profile: No results for input(s): "INR", "PROTIME" in the last 168 hours. Thyroid Function Tests: No results for input(s): "TSH", "T4TOTAL", "FREET4", "T3FREE", "THYROIDAB" in the last 72 hours. Lipid Profile: No results for input(s): "CHOL", "HDL", "LDLCALC", "TRIG", "CHOLHDL", "LDLDIRECT" in the last 72 hours. Anemia Panel: No results for input(s): "VITAMINB12", "FOLATE", "FERRITIN", "TIBC", "IRON", "RETICCTPCT" in the last 72 hours. Urine analysis:    Component Value Date/Time   COLORURINE YELLOW 08/05/2021 1429   APPEARANCEUR CLEAR 08/05/2021 1429   LABSPEC 1.030 08/05/2021 1429   PHURINE 5.0 08/05/2021 1429   GLUCOSEU >=500 (A) 08/05/2021 1429   HGBUR SMALL (A) 08/05/2021 1429   BILIRUBINUR NEGATIVE 08/05/2021 1429   KETONESUR  NEGATIVE 08/05/2021 1429   PROTEINUR NEGATIVE 08/05/2021 1429   NITRITE NEGATIVE 08/05/2021 1429   LEUKOCYTESUR NEGATIVE 08/05/2021 1429   Sepsis Labs: Invalid input(s): "PROCALCITONIN", "LACTICIDVEN"  Microbiology: No results found for this or any previous visit (from the past 240 hour(s)).  Radiology Studies: No results found.    Ramiel Forti T. Deeric Cruise [facility]  If 7PM-7AM, please contact night-coverage [email or web address] 02/28/2022, 1:20 PM

## 2022-02-28 NOTE — Progress Notes (Signed)
HR 97-201. Sinus paced. Carvedilol PO given. Pt states "feels like an elephant on chest." Attending MD notified. Cardiology MD paged. EKG obtained.  Kenard Gower, RN

## 2022-02-28 NOTE — TOC Initial Note (Signed)
Transition of Care [facility]) - Initial/Assessment Note    Patient Details  Name: Luis [name] MRN: [id number] Date of Birth: [birth year]  Transition of Care [facility]) CM/SW Contact:    Lawerance Sabal, RN Phone Number: 02/28/2022, 12:55 PM  Clinical Narrative:                 Patient agreeable to [facility] services. No preference of provider. [facility] accepting. Farxiga coupon left at bedside.  Will need HH PT order.   Expected Discharge Plan: Home w Home Health Services Barriers to Discharge: Continued Medical Work up   Patient Goals and CMS Choice        Expected Discharge Plan and Services Expected Discharge Plan: Home w Home Health Services                                   HH Arranged: PT [facility] Agency: [facility] Date [facility] Agency Contacted: 02/28/22 Time HH Agency Contacted: 1000 Representative spoke with at [facility] Agency: Elnita Maxwell  Prior Living Arrangements/Services                       Activities of Daily Living Home Assistive Devices/Equipment: Environmental consultant (specify type) ADL Screening (condition at time of admission) Patient's cognitive ability adequate to safely complete daily activities?: Yes Is the patient deaf or have difficulty hearing?: No Does the patient have difficulty seeing, even when wearing glasses/contacts?: No Does the patient have difficulty concentrating, remembering, or making decisions?: No Patient able to express need for assistance with ADLs?: Yes Does the patient have difficulty dressing or bathing?: No Independently performs ADLs?: Yes (appropriate for developmental age) Does the patient have difficulty walking or climbing stairs?: Yes Weakness of Legs: Both Weakness of Arms/Hands: None  Permission Sought/Granted                  Emotional Assessment              Admission diagnosis:  Acute on chronic combined systolic and diastolic heart failure (HCC) [I50.43] Elevated troponin [R77.8] Normochromic normocytic  anemia [D64.9] Acute on chronic systolic CHF (congestive heart failure) (HCC) [I50.23] Acute on chronic combined systolic and diastolic CHF (congestive heart failure) (HCC) [I50.43] Patient Active Problem List   Diagnosis Date Noted   Acute on chronic combined systolic and diastolic CHF (congestive heart failure) (HCC) 02/26/2022   Cocaine abuse (HCC) 02/26/2022   Chronic respiratory failure with hypoxia (HCC) - 2.5 L/min 02/26/2022   Sleep apnea 08/07/2021   MDD (major depressive disorder), recurrent severe, 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   Moderate episode of recurrent major depressive disorder (HCC) 05/08/2021   Generalized anxiety disorder 05/08/2021   Unspecified mood (affective) disorder (HCC) 05/08/2021   Insomnia 05/08/2021   Cocaine use disorder, moderate, dependence (HCC) 04/02/2021   Right arm weakness 01/28/2021   Right sided weakness 01/28/2021   Nonischemic cardiomyopathy (HCC)    Morbid obesity (HCC) 05/05/2019   Chronic systolic CHF (congestive heart failure) (HCC) 01/17/2019   Pacemaker    HLD (hyperlipidemia)    Diabetes mellitus without complication (HCC)    Hypertension    GERD (gastroesophageal reflux disease)    Tobacco abuse    PCP:  Margot Ables, MD (Inactive) Pharmacy:   [id number] [place],  - [address] AT [facility] [address] [place] [address] Phone: [phone] Fax:  [phone]  [facility] [address] [place] - [address] [place] [address] Phone: [phone] Fax: [phone]  [facility] [place], [place] - [address] [place] [address] Phone: [phone] Fax: [phone]  [facility] [address] [place] [address] Phone: [phone] Fax: [phone]     Social Determinants of Health (SDOH) Interventions Food Insecurity  Interventions: Intervention Not Indicated Housing Interventions: Intervention Not Indicated Transportation Interventions: Intervention Not Indicated Financial Strain Interventions: Intervention Not Indicated  Readmission Risk Interventions    01/31/2021   12:26 PM 01/31/2021    9:25 AM  Readmission Risk Prevention Plan  Transportation Screening Complete   PCP or Specialist Appt within 3-5 Days Complete Complete  HRI or Home Care Consult Complete Complete  Social Work Consult for Recovery Care Planning/Counseling Patient refused Complete  Palliative Care Screening Not Applicable Not Applicable  Medication Review Oceanographer) Complete Referral to Pharmacy

## 2022-02-28 NOTE — Consult Note (Addendum)
Cardiology Consultation   Patient ID: Luis [name] MRN: [id number]; DOB: May 15, 1971  Admit date: 02/26/2022 Date of Consult: 02/28/2022  PCP:  Buzzy Han, MD (Inactive)   [facility] Cardiologist:  Glenetta Hew, MD   {   Patient Profile:   Luis [name] is a 51 y.o. male with a hx of chronic systolic heart failure, nonischemic cardiomyopathy, status post biventricular ICD, chronic hypoxic respiratory failure on 2.5 L nasal cannula, OSA on CPAP, hypertension, hyperlipidemia, type 2 diabetes, cocaine abuse, tobacco abuse, morbid obesity, PTSD, chronic anxiety, depression, who is being seen 02/28/2022 for the evaluation of CHF at the request of Dr Cyndia Skeeters.  History of Present Illness:   Mr. Stalter follows [facility] cardiology historically, seen by [facility] cardiology for inpatient consultation 2021 and 2022.  Reportedly he was diagnosed with nonischemic cardiomyopathy in 2012 in [place], etiology felt due to cocaine abuse.  [facility] cardiology 03/07/2019 notes mentioned patient has CAD with history of angioplasty in the past, no PCI required. He reported history of ventricular tachycardia requiring ICD placement and underwent generator change 06/2018.  He was hospitalized 04/2018 for chest pain at [facility]. Echo at that time showed LVEF of 25-30% with global hypokinesis. He underwent a nuclear stress test which showed reversibility in the mid apex and partial reversibility in the inferior wall consistent with prior wall infarct and peri-infarct ischemia. Patient was seen by cardiology who did not feel like any further invasive ischemic evaluation was indicated at the time.   He was readmitted to [facility] 12/2018 with chest pain and ruled out for MI. Coronary CTA was of very limited quality with significant artifact from his BiV leads, but no significant plaque was seen in the visualized portions of the coronary arteries. He reportedly has history  of cough with ACEI.   He was hospitalized here 06/2019 for chest pain with ongoing cocaine use.  Device interrogation revealed runs of SVT but no VT/VF or shocks.  EKG without acute change.  High sensitive troponin were negative x2.  Echo revealed EF 35 to 40%, moderate LVH, global HK, grade 2 DD, mild to moderately dilated LV, normal RV, mild elevated PA pressure.  He was recommended cocaine cessation, beta-blocker was stopped and he was started on long-acting nitrate, no further ischemic work-up was recommended.   He was hospitalized again here on 09/2020 with chest pai with ongoing cocaine abuse.  He reported ICD had been fired.  Troponin were negative.  EKG without acute change.,  Echocardiogram revealed LVEF 30 to 35%, global hypokinesis, grade 2 DD, elevated LA pressure, normal RV, mild LAE.  He was again recommended cocaine cessation which is likely the cause of his chest pain and cardiomyopathy.  He was recently discharged from here after hospitalized 08/06/2021 to 08/22/2021 for suicidal ideation and severe depression.   He presented to the ER 02/26/2022 complaining 7 days onset of worsening shortness of breath.  Reportedly he was discharged from drug and alcohol rehab 3 days prior to admission.  His ICD was reportedly having 76-month remaining battery life.  He was taking Lasix 80 mg daily but increased up to 360 mg 02/25/2022 for leg swelling.  He denied using cocaine however UDS positive for cocaine from 02/26/2022, he states he was just around someone who was using cocaine but did not use himself.  He states he is feeling improved since admission, had urinated well, leg swelling is resolved. He states he get periodic chest pressure, not necessarily related to  activity, also takes coreg and metoprolol.    Admission diagnostic showed elevated BNP 136, high sensitive troponin 19-12, CBC revealed mild leukocytosis 10 600, hemoglobin 11.7, BMP grossly unremarkable.  Chest x-ray showed mild vascular  congestion without edema.  He was admitted to hospital medicine service for decompensated CHF. Echocardiogram from 02/27/2022 revealed LVEF 25%, global hypokinesis, mildly reduced RV, trivial MR.  He was started on IV Lasix 80 mg daily.  Net -5 L and weight is down from 300 pounds to 298 pounds since admission.  Cardiology is consulted today for further input.    Past Medical History:  Diagnosis Date   Anxiety    Biventricular ICD (implantable cardioverter-defibrillator) in place    Chronic systolic CHF (congestive heart failure) (HCC)    Cocaine use    Diabetes mellitus without complication (HCC)    GERD (gastroesophageal reflux disease)    HLD (hyperlipidemia)    Hypertension    Morbid obesity (HCC)    NICM (nonischemic cardiomyopathy) (HCC)    OSA (obstructive sleep apnea)    PTSD (post-traumatic stress disorder)    on Depakote   Refusal of blood transfusions as patient is Jehovah's Witness    Tobacco abuse     Past Surgical History:  Procedure Laterality Date   BIV ICD INSERTION CRT-D     FOOT FRACTURE SURGERY     ROTATOR CUFF REPAIR     TONSILLECTOMY       Home Medications:  Prior to Admission medications   Medication Sig Start Date End Date Taking? Authorizing Provider  clopidogrel (PLAVIX) 75 MG tablet Take 1 tablet (75 mg total) by mouth daily. 08/22/21  Yes Clapacs, Madie Reno, MD  cyclobenzaprine (FLEXERIL) 10 MG tablet Take 10 mg by mouth 3 (three) times daily. 01/20/22  Yes [provider]  DULoxetine (CYMBALTA) 30 MG capsule Take 3 capsules (90 mg total) by mouth daily. 09/03/21  Yes Nwoko, Uchenna E, PA  furosemide (LASIX) 80 MG tablet Take 1 tablet (80 mg total) by mouth daily. 08/21/21  Yes Clapacs, Madie Reno, MD  hydrOXYzine (ATARAX) 25 MG tablet Take 1 tablet (25 mg total) by mouth 3 (three) times daily as needed for anxiety. 09/03/21  Yes Nwoko, Uchenna E, PA  Insulin Lispro Prot & Lispro (HUMALOG MIX 75/25 KWIKPEN) (75-25) 100 UNIT/ML Kwikpen Inject 70 Units into  the skin 3 (three) times daily.   Yes [provider]  LANTUS SOLOSTAR 100 UNIT/ML Solostar Pen Inject 50 Units into the skin 2 (two) times daily. 01/12/22  Yes [provider]  neomycin-polymyxin-hydrocortisone (CORTISPORIN) 3.5-10000-1 OTIC suspension Place 4 drops into the left ear 4 (four) times daily. 10 day course. 02/19/22  Yes [provider]  pantoprazole (PROTONIX) 40 MG tablet Take 1 tablet (40 mg total) by mouth daily. 08/21/21  Yes Clapacs, Madie Reno, MD  prazosin (MINIPRESS) 2 MG capsule Take 1 capsule (2 mg total) by mouth at bedtime. 09/03/21  Yes Nwoko, Uchenna E, PA  rosuvastatin (CRESTOR) 20 MG tablet Take 1 tablet (20 mg total) by mouth daily. 08/22/21  Yes Clapacs, Madie Reno, MD  traZODone (DESYREL) 100 MG tablet Take 2 tablets (200 mg total) by mouth at bedtime. 09/03/21  Yes Nwoko, Uchenna E, PA  TRULICITY 1.5 0000000 SOPN Inject 1.5 mg into the skin once a week. Patient taking differently: Inject 1.5 mg into the skin every Friday. 08/21/21  Yes Clapacs, Madie Reno, MD  buPROPion HCl ER, XL, 450 MG TB24 Take 450 mg by mouth daily. Patient not  taking: Reported on 02/27/2022 09/03/21   Meta Hatchet, PA  carvedilol (COREG) 6.25 MG tablet Take 1 tablet (6.25 mg total) by mouth 2 (two) times daily with a meal. Patient not taking: Reported on 02/27/2022 08/21/21   Clapacs, [name], MD  divalproex (DEPAKOTE) 500 MG DR tablet Take 500 mg by mouth 2 (two) times daily. Patient not taking: Reported on 02/27/2022 10/29/21   [provider]  insulin aspart protamine- aspart (NOVOLOG MIX 70/30) (70-30) 100 UNIT/ML injection Inject 0.6 mLs (60 Units total) into the skin 3 (three) times daily after meals. Patient not taking: Reported on 02/26/2022 08/21/21   Clapacs, [name], MD  sertraline (ZOLOFT) 50 MG tablet Take 150 mg by mouth daily. Patient not taking: Reported on 02/27/2022 10/29/21   [provider]  VICTOZA 18 MG/3ML SOPN Inject 1.8 mg into the skin in the  morning. Patient not taking: Reported on 02/27/2022 12/18/21   [provider]    Inpatient Medications: Scheduled Meds:  carvedilol  6.25 mg Oral BID WC   clopidogrel  75 mg Oral Daily   dapagliflozin propanediol  10 mg Oral Daily   DULoxetine  90 mg Oral Daily   furosemide  80 mg Intravenous Daily   heparin  5,000 Units Subcutaneous Q8H   insulin aspart  0-15 Units Subcutaneous TID WC   insulin aspart  0-5 Units Subcutaneous QHS   insulin aspart  4 Units Subcutaneous TID WC   insulin glargine-yfgn  15 Units Subcutaneous Daily   neomycin-polymyxin-hydrocortisone  4 drop Left EAR QID   pantoprazole  40 mg Oral Daily   prazosin  2 mg Oral QHS   rosuvastatin  20 mg Oral Daily   sodium chloride flush  3 mL Intravenous Q12H   traZODone  200 mg Oral QHS   Continuous Infusions:  sodium chloride     PRN Meds: sodium chloride, acetaminophen, ipratropium-albuterol, ondansetron (ZOFRAN) IV, sodium chloride flush  Allergies:    Allergies  Allergen Reactions   Aspirin Anaphylaxis and Other (See Comments)    Throat closing    Iodine-131 Hives   Iodinated Contrast Media Hives   Latex Hives and Rash   Penicillins Nausea And Vomiting   Ultram [Tramadol] Hives and Nausea Only   Augmentin [Amoxicillin-Pot Clavulanate] Diarrhea   Lidocaine Rash   Zestril [Lisinopril] Cough    Social History:   Social History   Socioeconomic History   Marital status: Widowed    Spouse name: Not on file   Number of children: 7   Years of education: Not on file   Highest education level: Bachelor's degree (e.g., BA, AB, BS)  Occupational History   Occupation: disability  Tobacco Use   Smoking status: Every Day    Packs/day: 0.20    Types: Cigarettes   Smokeless tobacco: Never   Tobacco comments:    occasionally  Vaping Use   Vaping Use: Never used  Substance and Sexual Activity   Alcohol use: Not Currently   Drug use: Yes    Types: Cocaine   Sexual activity: Not Currently  Other  Topics Concern   Not on file  Social History Narrative   Not on file   Social Determinants of Health   Financial Resource Strain: Low Risk  (02/26/2022)   Overall Financial Resource Strain (CARDIA)    Difficulty of Paying Living Expenses: Not very hard  Food Insecurity: No Food Insecurity (02/26/2022)   Hunger Vital Sign    Worried About Running Out of Food in  the Last Year: Never true    [place] in the Last Year: Never true  Transportation Needs: No Transportation Needs (02/26/2022)   PRAPARE - Hydrologist (Medical): No    Lack of Transportation (Non-Medical): No  Physical Activity: Not on file  Stress: Not on file  Social Connections: Not on file  Intimate Partner Violence: Not on file    Family History:    Family History  Problem Relation Age of Onset   Hypertension Mother    Bone cancer Father    Lung cancer Father      ROS:  Constitutional: Denied fever, chills, malaise, night sweats Eyes: Denied vision change or loss Ears/Nose/Mouth/Throat: Denied ear ache, sore throat, coughing, sinus pain Cardiovascular: see HPI  Respiratory: see HPI  Gastrointestinal: Denied nausea, vomiting, abdominal pain, diarrhea Genital/Urinary: Denied dysuria, hematuria, urinary frequency/urgency Musculoskeletal: Denied muscle ache, joint pain, weakness Skin: Denied rash, wound Neuro: Denied headache, dizziness, syncope Psych: history of depression/anxiety  Endocrine: history of diabetes   Physical Exam/Data:   Vitals:   02/28/22 0542 02/28/22 0600 02/28/22 0800 02/28/22 0832  BP: 128/82   123/77  Pulse: 88   (!) 115  Resp: 14 (!) 22 17   Temp: 97.6 F (36.4 C)   98.2 F (36.8 C)  TempSrc: Oral   Oral  SpO2: 95%   93%  Weight: 135.2 kg     Height:        Intake/Output Summary (Last 24 hours) at 02/28/2022 1020 Last data filed at 02/28/2022 0558 Gross per 24 hour  Intake 1310 ml  Output 1750 ml  Net -440 ml      02/28/2022    5:42 AM  02/27/2022    5:10 AM 02/26/2022   12:45 PM  Last 3 Weights  Weight (lbs) 298 lb 1.6 oz 298 lb 4.8 oz 300 lb 1.6 oz  Weight (kg) 135.217 kg 135.308 kg 136.124 kg     Body mass index is 48.85 kg/m.   Vitals:  Vitals:   02/28/22 0800 02/28/22 0832  BP:  123/77  Pulse:  (!) 115  Resp: 17   Temp:  98.2 F (36.8 C)  SpO2:  93%   General Appearance: In no apparent distress, laying in bed, obese HEENT: Normocephalic, atraumatic.  Neck: Supple, trachea midline, no JVDs Cardiovascular: Regular rate and rhythm, normal S1-S2,  no murmur Respiratory: Resting breathing unlabored, lungs sounds clear but diminished to base to auscultation bilaterally, no use of accessory muscles. On room air.   Gastrointestinal: Bowel sounds positive, abdomen soft, non-tender Extremities: Able to move all extremities in bed without difficulty, no edema Musculoskeletal: Normal muscle bulk and tone Skin: Intact, warm, dry. No rashes or petechiae noted in exposed areas.  Neurologic: Alert, oriented to person, place and time. Fluent speech, no facial droop, no cognitive deficit Psychiatric: Normal affect. Mood is appropriate.      EKG:  The EKG was personally reviewed and demonstrates: Ventricular paced rhythm  Telemetry:  Telemetry was personally reviewed and demonstrates:  Ventricular paced rhythm  Relevant CV Studies:  Echocardiogram from 02/27/2022:   1. Left ventricular ejection fraction, by estimation, is 25%. The left  ventricle has severely decreased function. The left ventricle demonstrates  global hypokinesis. Cannot comment on regional wall motion abnormalities  and Definity contrast was not  attempted. Consider repeat limited echo for RWM assessment if clinically  indicated. The left ventricular internal cavity size was mildly dilated.  Left ventricular diastolic parameters  are indeterminate.   2. Right ventricular systolic function is mildly reduced. The right  ventricular size is normal.  Tricuspid regurgitation signal is inadequate  for assessing PA pressure.   3. The mitral valve is grossly normal. Trivial mitral valve  regurgitation. No evidence of mitral stenosis.   4. The aortic valve is grossly normal. There is mild calcification of the  aortic valve. Aortic valve regurgitation is not visualized. No aortic  stenosis is present.   5. The inferior vena cava is normal in size with greater than 50%  respiratory variability, suggesting right atrial pressure of 3 mmHg.    Echocardiogram 01/28/2021   1. No LV thrombus is seen. There is marked septal-lateral wall left  ventricular dyssynchrony. Left ventricular ejection fraction, by  estimation, is 30 to 35%. The left ventricle has moderately decreased  function. The left ventricle demonstrates global  hypokinesis. Left ventricular diastolic parameters are consistent with  Grade II diastolic dysfunction (pseudonormalization). Elevated left atrial  pressure.   2. Right ventricular systolic function is normal. The right ventricular  size is normal. Tricuspid regurgitation signal is inadequate for assessing  PA pressure.   3. Left atrial size was moderately dilated.   4. The mitral valve is normal in structure. Mild mitral valve  regurgitation. No evidence of mitral stenosis.   5. The aortic valve is normal in structure. Aortic valve regurgitation is  not visualized. No aortic stenosis is present.   6. The inferior vena cava is dilated in size with <50% respiratory  variability, suggesting right atrial pressure of 15 mmHg.   CT coronary study on 01/19/2019:  1. Coronary calcium not possible to assess secondary to artifact with BiV ICD leads, however no calcifications seen in the visualized portions of coronary arteries.   2. Normal coronary origin with left dominance.   3. This study is of very limited quality with significant artifact from leads of biventricular pacemaker. There is no obvious plaque in the visualized  portions of coronary arteries - LM, proximal, mid LAD, proximal and mid portion of LCX artery and PDA.    Laboratory Data:  High Sensitivity Troponin:   Recent Labs  Lab 02/26/22 0315 02/26/22 0739 02/27/22 2012 02/27/22 2256  TROPONINIHS 19* 12 19* 20*     Chemistry Recent Labs  Lab 02/26/22 0315 02/27/22 0450 02/28/22 0308  NA 139 139 141  K 3.7 3.2* 4.2  CL 106 99 103  CO2 25 28 27   GLUCOSE 166* 140* 250*  BUN 9 13 18   CREATININE 0.87 1.01 1.05  CALCIUM 8.6* 8.9 9.2  MG  --  2.1 2.3  GFRNONAA >60 >60 >60  ANIONGAP 8 12 11     Recent Labs  Lab 02/27/22 0450 02/28/22 0308  PROT 7.0  --   ALBUMIN 3.4* 3.4*  AST 22  --   ALT 20  --   ALKPHOS 45  --   BILITOT 0.5  --    Lipids No results for input(s): "CHOL", "TRIG", "HDL", "LABVLDL", "LDLCALC", "CHOLHDL" in the last 168 hours.  Hematology Recent Labs  Lab 02/26/22 0315 02/27/22 0450 02/28/22 0308  WBC 10.6* 9.2 7.6  RBC 3.99* 4.31 4.34  HGB 11.7* 12.5* 12.7*  HCT 35.7* 38.2* 37.9*  MCV 89.5 88.6 87.3  MCH 29.3 29.0 29.3  MCHC 32.8 32.7 33.5  RDW 13.7 13.3 13.2  PLT 249 261 258   Thyroid No results for input(s): "TSH", "FREET4" in the last 168 hours.  BNP Recent Labs  Lab 02/26/22 0315  02/27/22 0450 02/28/22 0308  BNP 136.5* 100.7* 87.4    DDimer No results for input(s): "DDIMER" in the last 168 hours.   Radiology/Studies:  ECHOCARDIOGRAM COMPLETE  Result Date: 02/27/2022    ECHOCARDIOGRAM REPORT   Patient Name:   POWER ARNET Date of Exam: 02/27/2022 Medical Rec #:  [id number]             Height:       65.5 in Accession #:    [id number]            Weight:       298.3 lb Date of Birth:  [birth year]              BSA:          2.358 m Patient Age:    47 years              BP:           141/60 mmHg Patient Gender: M                     HR:           93 bpm. Exam Location:  Inpatient Procedure: 2D Echo                              MODIFIED REPORT: This report was modified by Cherlynn Kaiser MD  on 02/27/2022 due to revision.  Indications:     CHF  History:         Patient has prior history of Echocardiogram examinations, most                  recent 01/28/2021. CHF, Pacemaker; Risk Factors:Dyslipidemia,                  Diabetes, Hypertension, Current Smoker and cocaine abuse.  Sonographer:     Harvie Junior Referring Phys:  [id number] ERIC CHEN Diagnosing Phys: Cherlynn Kaiser MD  Sonographer Comments: Technically difficult study due to poor echo windows and patient is obese. Image acquisition challenging due to patient body habitus, Image acquisition challenging due to uncooperative patient and refused ultrasound contrast. IMPRESSIONS  1. Left ventricular ejection fraction, by estimation, is 25%. The left ventricle has severely decreased function. The left ventricle demonstrates global hypokinesis. Cannot comment on regional wall motion abnormalities and Definity contrast was not attempted. Consider repeat limited echo for RWM assessment if clinically indicated. The left ventricular internal cavity size was mildly dilated. Left ventricular diastolic parameters are indeterminate.  2. Right ventricular systolic function is mildly reduced. The right ventricular size is normal. Tricuspid regurgitation signal is inadequate for assessing PA pressure.  3. The mitral valve is grossly normal. Trivial mitral valve regurgitation. No evidence of mitral stenosis.  4. The aortic valve is grossly normal. There is mild calcification of the aortic valve. Aortic valve regurgitation is not visualized. No aortic stenosis is present.  5. The inferior vena cava is normal in size with greater than 50% respiratory variability, suggesting right atrial pressure of 3 mmHg. FINDINGS  Left Ventricle: Left ventricular ejection fraction, by estimation, is 25%. The left ventricle has severely decreased function. The left ventricle demonstrates global hypokinesis. The left ventricular internal cavity size was mildly dilated. There is no left  ventricular hypertrophy. Left ventricular diastolic parameters are indeterminate. Right Ventricle: The right ventricular size is normal. No increase in right ventricular wall thickness. Right ventricular systolic function is mildly  reduced. Tricuspid regurgitation signal is inadequate for assessing PA pressure. Left Atrium: Left atrial size was normal in size. Right Atrium: Right atrial size was normal in size. Pericardium: There is no evidence of pericardial effusion. Mitral Valve: The mitral valve is grossly normal. Trivial mitral valve regurgitation. No evidence of mitral valve stenosis. Tricuspid Valve: The tricuspid valve is normal in structure. Tricuspid valve regurgitation is not demonstrated. No evidence of tricuspid stenosis. Aortic Valve: The aortic valve is grossly normal. There is mild calcification of the aortic valve. Aortic valve regurgitation is not visualized. No aortic stenosis is present. Aortic valve mean gradient measures 4.0 mmHg. Aortic valve peak gradient measures 7.0 mmHg. Aortic valve area, by VTI measures 2.22 cm. Pulmonic Valve: The pulmonic valve was normal in structure. Pulmonic valve regurgitation is trivial. No evidence of pulmonic stenosis. Aorta: The aortic root is normal in size and structure. Venous: The inferior vena cava is normal in size with greater than 50% respiratory variability, suggesting right atrial pressure of 3 mmHg. IAS/Shunts: The interatrial septum was not well visualized. Additional Comments: A device lead is visualized in the right atrium and right ventricle.  LEFT VENTRICLE PLAX 2D LVIDd:         5.90 cm   Diastology LVIDs:         4.65 cm   LV e' medial:    8.49 cm/s LV PW:         1.20 cm   LV E/e' medial:  10.4 LV IVS:        1.20 cm   LV e' lateral:   5.11 cm/s LVOT diam:     2.10 cm   LV E/e' lateral: 17.2 LV SV:         52 LV SV Index:   22 LVOT Area:     3.46 cm  RIGHT VENTRICLE RV Basal diam:  3.60 cm RV Mid diam:    3.50 cm RV S prime:     17.10 cm/s  TAPSE (M-mode): 1.8 cm LEFT ATRIUM             Index        RIGHT ATRIUM           Index LA diam:        4.10 cm 1.74 cm/m   RA Area:     10.40 cm LA Vol (A2C):   66.4 ml 28.16 ml/m  RA Volume:   20.90 ml  8.86 ml/m LA Vol (A4C):   40.9 ml 17.35 ml/m LA Biplane Vol: 52.1 ml 22.10 ml/m  AORTIC VALVE                    PULMONIC VALVE AV Area (Vmax):    2.43 cm     PV Vmax:       1.05 m/s AV Area (Vmean):   2.40 cm     PV Peak grad:  4.4 mmHg AV Area (VTI):     2.22 cm AV Vmax:           132.00 cm/s AV Vmean:          88.200 cm/s AV VTI:            0.232 m AV Peak Grad:      7.0 mmHg AV Mean Grad:      4.0 mmHg LVOT Vmax:         92.70 cm/s LVOT Vmean:        61.000 cm/s LVOT VTI:  0.149 m LVOT/AV VTI ratio: 0.64  AORTA Ao Root diam: 3.00 cm Ao Asc diam:  3.00 cm MITRAL VALVE MV Area (PHT): 6.43 cm    SHUNTS MV Decel Time: 118 msec    Systemic VTI:  0.15 m MR Peak grad: 18.1 mmHg    Systemic Diam: 2.10 cm MR Vmax:      212.50 cm/s MV E velocity: 88.10 cm/s MV A velocity: 44.10 cm/s MV E/A ratio:  2.00 Cherlynn Kaiser MD Electronically signed by Cherlynn Kaiser MD Signature Date/Time: 02/27/2022/4:45:50 PM    Final (Updated)    DG Chest Port 1 View  Result Date: 02/26/2022 CLINICAL DATA:  Shortness of breath EXAM: PORTABLE CHEST 1 VIEW COMPARISON:  08/10/2021 FINDINGS: Cardiac shadow is enlarged. Defibrillator is again noted. Lungs are well aerated bilaterally. No focal infiltrate or sizable effusion is seen. Mild central vascular congestion is noted. IMPRESSION: Mild vascular congestion without edema. Stable cardiomegaly. Electronically Signed   By: Inez Catalina M.D.   On: 02/26/2022 03:28     Assessment and Plan:   Acute on chronic systolic and diastolic heart failure Nonischemic cardiomyopathy -Presented with 1 week onset shortness of breath, leg edema -LVEF 25% on echo 02/27/2022, dropped from 30 to 35% on 01/28/2021 echo -UDS positive for cocaine on 02/26/2022 -Patient again counseled  cocaine cessation which is likely the etiology of his NICM -Net -5 L, weight is down from 300 to 298 pounds since admission -Clinically mildy hypervolemic  -Recommend continue IV Lasix 80 mg daily for another 24 hours  -GDMT: Continue carvedilol 6.25 mg twice daily,advised patient to STOP metoprolol,  will trial losartan 25mg  daily if tolerating may transition to [facility] outpatient, add spironolactone if BP allows, agree with adding Farxiga/monitor side effect  History of ventricular tachycardia -PPM is approaching end-of-battery life, will need generator change by primary EP cardiology at [facility] upon discharge  Cocaine abuse -Discussed this matter  Anxiety with depression Type 2 diabetes BPH HTN Hyperlipidemia -Per primary team   Risk Assessment/Risk Scores:  { New York Heart Association (NYHA) Functional Class NYHA Class III      For questions or updates, please contact [place] Please consult [email or web address] for contact info under    Signed, Margie Billet, NP  02/28/2022 10:20 AM  Patient seen and examined with Margie Billet NP.  Agree as above, with the following exceptions and changes as noted below. Pt with decompensated HF in setting of UDS positive for cocaine and EF 25%. Follows with [facility]. Gen: restless, CV: RRR, no murmurs, Lungs: basilar crackles, Abd: soft, Extrem: Warm, well perfused, mild edema, Neuro/Psych: alert and oriented x 3, normal mood and affect. All available labs, radiology testing, previous records reviewed. May benefit from 1-2 days of additional diuresis. EF drop likely secondary to drug positive UDS. Trial losartan given cough with lisinopril, if well tolerated would consider Entresto. Can coordinate with his home cardiologist. F/U with EP for end of life generator requiring pack change.   Cardiology will see as needed prior to dismissal.   Elouise Munroe, MD 02/28/22 11:12 AM

## 2022-02-28 NOTE — Progress Notes (Signed)
Patient HR 80s-150s. Patient feeling palpitations. Sinus paced. Notified cardiology and MD attending.  Luis [name] Dennard Nip

## 2022-03-01 DIAGNOSIS — J9611 Chronic respiratory failure with hypoxia: Secondary | ICD-10-CM | POA: Diagnosis not present

## 2022-03-01 DIAGNOSIS — F1410 Cocaine abuse, uncomplicated: Secondary | ICD-10-CM | POA: Diagnosis not present

## 2022-03-01 DIAGNOSIS — E119 Type 2 diabetes mellitus without complications: Secondary | ICD-10-CM | POA: Diagnosis not present

## 2022-03-01 DIAGNOSIS — I5043 Acute on chronic combined systolic (congestive) and diastolic (congestive) heart failure: Secondary | ICD-10-CM | POA: Diagnosis not present

## 2022-03-01 LAB — RENAL FUNCTION PANEL
Albumin: 3.5 g/dL (ref 3.5–5.0)
Anion gap: 9 (ref 5–15)
BUN: 17 mg/dL (ref 6–20)
CO2: 28 mmol/L (ref 22–32)
Calcium: 9.2 mg/dL (ref 8.9–10.3)
Chloride: 101 mmol/L (ref 98–111)
Creatinine, Ser: 0.98 mg/dL (ref 0.61–1.24)
GFR, Estimated: >60 mL/min (ref >60)
Glucose, Bld: 187 mg/dL — ABNORMAL HIGH (ref 70–99)
Phosphorus: 5.2 mg/dL — ABNORMAL HIGH (ref 2.5–4.6)
Potassium: 3.9 mmol/L (ref 3.5–5.1)
Sodium: 138 mmol/L (ref 135–145)

## 2022-03-01 LAB — GLUCOSE, CAPILLARY
Glucose-Capillary: 189 mg/dL — ABNORMAL HIGH (ref 70–99)
Glucose-Capillary: 199 mg/dL — ABNORMAL HIGH (ref 70–99)
Glucose-Capillary: 179 mg/dL — ABNORMAL HIGH (ref 70–99)
Glucose-Capillary: 141 mg/dL — ABNORMAL HIGH (ref 70–99)
Glucose-Capillary: 192 mg/dL — ABNORMAL HIGH (ref 70–99)

## 2022-03-01 LAB — CBC
HCT: 41.6 % (ref 39.0–52.0)
Hemoglobin: 13.5 g/dL (ref 13.0–17.0)
MCH: 28.8 pg (ref 26.0–34.0)
MCHC: 32.5 g/dL (ref 30.0–36.0)
MCV: 88.9 fL (ref 80.0–100.0)
Platelets: 278 10*3/uL (ref 150–400)
RBC: 4.68 MIL/uL (ref 4.22–5.81)
RDW: 13.2 % (ref 11.5–15.5)
WBC: 7.9 10*3/uL (ref 4.0–10.5)
nRBC: 0 % (ref 0.0–0.2)

## 2022-03-01 LAB — MAGNESIUM: Magnesium: 2.3 mg/dL (ref 1.7–2.4)

## 2022-03-01 LAB — TSH
TSH: 0.151 u[IU]/mL — ABNORMAL LOW (ref 0.350–4.500)
TSH: 0.117 u[IU]/mL — ABNORMAL LOW (ref 0.350–4.500)

## 2022-03-01 LAB — T4, FREE: Free T4: 1.11 ng/dL (ref 0.61–1.12)

## 2022-03-01 MED ORDER — HYDROXYZINE HCL 25 MG PO TABS
25.0000 mg | ORAL_TABLET | Freq: Three times a day (TID) | ORAL | Status: DC | PRN
  Administered 2022-03-03: 25 mg via ORAL
  Filled 2022-03-01: qty 1

## 2022-03-01 MED ORDER — SPIRONOLACTONE 25 MG PO TABS
25.0000 mg | ORAL_TABLET | Freq: Every day | ORAL | Status: DC
  Administered 2022-03-01 – 2022-03-08 (×8): 25 mg via ORAL
  Filled 2022-03-01 (×8): qty 1

## 2022-03-01 MED ORDER — INSULIN ASPART 100 UNIT/ML IJ SOLN
0.0000 [IU] | Freq: Three times a day (TID) | INTRAMUSCULAR | Status: DC
  Administered 2022-03-01: 3 [IU] via SUBCUTANEOUS
  Administered 2022-03-02: 4 [IU] via SUBCUTANEOUS
  Administered 2022-03-02: 3 [IU] via SUBCUTANEOUS
  Administered 2022-03-02: 4 [IU] via SUBCUTANEOUS
  Administered 2022-03-03: 3 [IU] via SUBCUTANEOUS
  Administered 2022-03-03 – 2022-03-04 (×3): 4 [IU] via SUBCUTANEOUS
  Administered 2022-03-04 (×2): 3 [IU] via SUBCUTANEOUS
  Administered 2022-03-05 (×2): 7 [IU] via SUBCUTANEOUS
  Administered 2022-03-05: 4 [IU] via SUBCUTANEOUS
  Administered 2022-03-06: 11 [IU] via SUBCUTANEOUS
  Administered 2022-03-06: 3 [IU] via SUBCUTANEOUS
  Administered 2022-03-07 (×3): 4 [IU] via SUBCUTANEOUS
  Administered 2022-03-08: 7 [IU] via SUBCUTANEOUS

## 2022-03-01 MED ORDER — INSULIN ASPART 100 UNIT/ML IJ SOLN
0.0000 [IU] | Freq: Every day | INTRAMUSCULAR | Status: DC
  Administered 2022-03-02 – 2022-03-05 (×2): 2 [IU] via SUBCUTANEOUS
  Administered 2022-03-06: 3 [IU] via SUBCUTANEOUS

## 2022-03-01 MED ORDER — INSULIN ASPART 100 UNIT/ML IJ SOLN
6.0000 [IU] | Freq: Three times a day (TID) | INTRAMUSCULAR | Status: DC
  Administered 2022-03-01 – 2022-03-03 (×5): 6 [IU] via SUBCUTANEOUS

## 2022-03-01 NOTE — Evaluation (Signed)
Occupational Therapy Evaluation Patient Details Name: Luis [name] MRN: [id number] DOB: 1971/05/21 Today's Date: 03/01/2022   History of Present Illness Pt is a 51 y/o male admitted secondary to SOB. Thought to be secondary to CHF. PMH includes pacemaker, DM, tobacco use, HTN and substance abuse.   Clinical Impression   PTA, pt lives alone, reports to this OT that he is unable to complete ADLs/IADLs at home due to SOB with significant other assisting in majority of this tasks. Pt able to demonstrate improved functional abilities than reported initially. Overall,pt able to mobilize in hallway with RW at Supervision level with SpO2 91-95% on RA though SOB still endorsed. Pt Setup for UB ADLs and Min A for LB ADLs. Brought AE in to pt room though pt declined that he would actually use these tools at home. Pt called girlfriend during session to confirm that she will assist with ADLs/IADLs at DC. Will follow acutely to progress independence within pt tolerance though feel no OT follow up needed at DC. However, pt is requesting a HH aide if possible.       Recommendations for follow up therapy are one component of a multi-disciplinary discharge planning process, led by the attending physician.  Recommendations may be updated based on patient status, additional functional criteria and insurance authorization.   Follow Up Recommendations  No OT follow up (pt requesting HH aide assistance at home for ADLs)    Assistance Recommended at Discharge Intermittent Supervision/Assistance  Patient can return home with the following A little help with bathing/dressing/bathroom;Assistance with cooking/housework    Functional Status Assessment  Patient has had a recent decline in their functional status and demonstrates the ability to make significant improvements in function in a reasonable and predictable amount of time.  Equipment Recommendations  None recommended by OT    Recommendations for Other  Services       Precautions / Restrictions Precautions Precautions: Fall Precaution Comments: monitor O2 Restrictions Weight Bearing Restrictions: No      Mobility Bed Mobility Overal bed mobility: Needs Assistance Bed Mobility: Supine to Sit     Supine to sit: Supervision          Transfers Overall transfer level: Needs assistance Equipment used: None Transfers: Sit to/from Stand Sit to Stand: Supervision           General transfer comment: able to stand without assist though once in standing, requested RW use d/t reported impaired sensation of RLE      Balance Overall balance assessment: Needs assistance Sitting-balance support: No upper extremity supported, Feet supported Sitting balance-Leahy Scale: Good     Standing balance support: No upper extremity supported Standing balance-Leahy Scale: Fair                             ADL either performed or assessed with clinical judgement   ADL Overall ADL's : Needs assistance/impaired Eating/Feeding: Independent   Grooming: Standing;Set up   Upper Body Bathing: Set up;Sitting   Lower Body Bathing: Minimal assistance;Sit to/from stand   Upper Body Dressing : Set up;Sitting   Lower Body Dressing: Minimal assistance Lower Body Dressing Details (indicate cue type and reason): brought AE to pt room though pt declined interest in using sock aide, just gets his girlfriend to assist if she is present. Educated briefly on propping foot up on stool or bed to increase ability to reach Toilet Transfer: Supervision/safety;Ambulation;Rolling walker (2 wheels)   Toileting- Clothing Manipulation  and Hygiene: Sitting/lateral lean;Sit to/from stand;Minimal assistance       Functional mobility during ADLs: Supervision/safety;Rolling walker (2 wheels) General ADL Comments: Able to do more than PLOF reports, ambulating in hallway with RW and WFL SpO2 sats on RA though SOB reported. educated on energy  conservation, CHF mgmt strategies (at mention of low sodium foods - pt calls girlfriend on the phone to ask her what she is going to cook for him at home as staff have been asking who was going to help him bathe, feed himself, get dressed, etc)     Vision Ability to See in Adequate Light: 0 Adequate Patient Visual Report: No change from baseline Vision Assessment?: No apparent visual deficits     Perception     Praxis      Pertinent Vitals/Pain Pain Assessment Pain Assessment: No/denies pain     Hand Dominance Right   Extremity/Trunk Assessment Upper Extremity Assessment Upper Extremity Assessment: Overall WFL for tasks assessed   Lower Extremity Assessment Lower Extremity Assessment: Defer to PT evaluation   Cervical / Trunk Assessment Cervical / Trunk Assessment: Other exceptions Cervical / Trunk Exceptions: increased body habitus   Communication Communication Communication: No difficulties   Cognition Arousal/Alertness: Awake/alert Behavior During Therapy: WFL for tasks assessed/performed Overall Cognitive Status: No family/caregiver present to determine baseline cognitive functioning                                 General Comments: contradictory PLOF info given. decreased insight into deficits as pt able to do more than initially reported     General Comments  Pt reported unable to don socks d/t small skin sticking up from cuticle and getting stuck on socks, reported need for someone to get scissors to cut it off. Pt reported pain with snagged skin but also reports numbness in R foot - RN aware. Also tele monitor would not reach to fit portable box back intact, asked RN if possible to locate tele box to place in pt's gown pocket for mobility    Exercises     Shoulder Instructions      Home Living Family/patient expects to be discharged to:: Private residence Living Arrangements: Alone Available Help at Discharge: Other  (Comment);Friend(s);Available PRN/intermittently (girlfriend) Type of Home: Apartment Home Access: Level entry     Home Layout: One level               Home Equipment: Rollator (4 wheels);Adaptive equipment Adaptive Equipment: Sock aid Additional Comments: Pt reports girlfriend can assist him with anything he needs but she works during the day.      Prior Functioning/Environment Prior Level of Function : Needs assist             Mobility Comments: Uses rollator for mobility but reports not being able to get up and walk around home recently ADLs Comments: Reports unable to bathe, dress self, orders food delivery (says he leaves the door unlocked so they bring it in to him). Girlfriend cooks, cleans, helps put pt socks on. Pt has AE but says he does not use and not interested in using        OT Problem List: Decreased strength;Decreased activity tolerance;Impaired balance (sitting and/or standing);Decreased knowledge of use of DME or AE;Cardiopulmonary status limiting activity      OT Treatment/Interventions: Self-care/ADL training;Therapeutic exercise;Energy conservation;DME and/or AE instruction;Therapeutic activities;Patient/family education    OT Goals(Current goals can be found in the  care plan section) Acute Rehab OT Goals Patient Stated Goal: get an aide to assist at home OT Goal Formulation: With patient Time For Goal Achievement: 03/15/22 Potential to Achieve Goals: Good  OT Frequency: Min 2X/week    Co-evaluation              AM-PAC OT "6 Clicks" Daily Activity     Outcome Measure Help from another person eating meals?: None Help from another person taking care of personal grooming?: A Little Help from another person toileting, which includes using toliet, bedpan, or urinal?: A Little Help from another person bathing (including washing, rinsing, drying)?: A Little Help from another person to put on and taking off regular upper body clothing?: A  Little Help from another person to put on and taking off regular lower body clothing?: A Little 6 Click Score: 19   End of Session Equipment Utilized During Treatment: Rolling walker (2 wheels) Nurse Communication: Mobility status  Activity Tolerance: Patient tolerated treatment well Patient left: in chair;with call bell/phone within reach  OT Visit Diagnosis: Muscle weakness (generalized) (M62.81)                Time: 6599-3570 OT Time Calculation (min): 35 min Charges:  OT General Charges $OT Visit: 1 Visit OT Evaluation $OT Eval Low Complexity: 1 Low OT Treatments $Self Care/Home Management : 8-22 mins  Bradd Canary, OTR/L Acute Rehab Services Office: [phone]   Lorre Munroe 03/01/2022, 11:26 AM

## 2022-03-01 NOTE — Progress Notes (Addendum)
PROGRESS NOTE  Luis LAROCCA [id number] DOB: [birth year]   PCP: Buzzy Han, MD (Inactive)  Patient is from: Home.  DOA: 02/26/2022 LOS: 2  Chief complaints Chief Complaint  Patient presents with   Shortness of Breath   Chest Pain     Brief Narrative / Interim history: 51 year old M with PMH of combined CHF, PPM, chronic hypoxic RF on 2.5 L, OSA on CPAP, morbid obesity and ongoing cocaine use disorder presenting with progressive shortness of breath for about a week despite increasing his home Lasix, and admitted for acute on chronic combined CHF.  BNP 136.  Chest x-ray consistent with CHF.  Urine is positive for cocaine.  TTE ordered.  Patient was started on IV Lasix.  TTE with LVEF of 25% (30 to 35% in 2022)., GH but not able to assess RWMA, diastolic function.  Cardiology consulted.  Remains on IV Lasix.   Subjective: Seen and examined earlier this morning.  Continues to endorse shortness of breath and chest pressure.  He states "my lungs are on fire".  His symptoms improved with CPAP.  Excellent urine output.  Renal function stable.  Objective: Vitals:   03/01/22 0417 03/01/22 0500 03/01/22 0900 03/01/22 1100  BP: (!) 145/80  (!) 119/103   Pulse: 87  (!) 101   Resp: 17  (!) 21 20  Temp: 97.8 F (36.6 C)  97.8 F (36.6 C)   TempSrc: Oral  Oral   SpO2: 95%  99%   Weight:  133.5 kg    Height:        Examination:  GENERAL: No apparent distress.  Nontoxic. HEENT: MMM.  Vision and hearing grossly intact.  NECK: Supple.  Difficult to assess JVD due to body habitus. RESP:  No IWOB.  Fair aeration bilaterally but limited exam due to body habitus. CVS:  RRR. Heart sounds normal.  ABD/GI/GU: BS+. Abd soft, NTND.  MSK/EXT:  Moves extremities. No apparent deformity.  Trace BLE edema. SKIN: no apparent skin lesion or wound NEURO: Awake and alert. Oriented appropriately.  No apparent focal neuro deficit. PSYCH: Calm. Normal affect.   Procedures:   None  Microbiology summarized: None  Assessment and plan: Principal Problem:   Acute on chronic combined systolic and diastolic CHF (congestive heart failure) (HCC) Active Problems:   Cocaine abuse (HCC)   Pacemaker   HLD (hyperlipidemia)   Diabetes mellitus without complication (HCC)   Hypertension   Tobacco abuse   Morbid obesity (HCC)   Sleep apnea   Chronic respiratory failure with hypoxia (HCC) - 2.5 L/min  Acute on chronic combined CHF: TTE with LVEF of 25%.  (LVEF of 30 to 35%, GH, G2-DD in 8/22).  Presents with progressive dyspnea.  Difficult to assess fluid status due to body habitus.  BNP 136>> 87.  CXR consistent with CHF.  UDS positive for cocaine.  Followed by [facility]ology.  Started on IV Lasix.  Net 6.7 L so far.  Creatinine is stable.  Still with significant shortness of breath and chest pressure. -Cardiology following -Continue IV Lasix 80 mg daily -GDMT-Farxiga, Coreg, Aldactone and losartan.  Plan to transition to [facility] -Strict I and O, daily weight, renal functions and electrolytes -Encouraged to stop cocaine use.  History of ventricular tachycardia/PPM: PPM approaching end of battery life. -Will need generator change by primary EP cardiology at [facility] upon discharge.   Cocaine abuse: UDS positive but he denies use stating that he was around someone who was using cocaine. -Advised to stop.   Chronic  respiratory failure with hypoxia: Stable on home 2.5 L -Continue home oxygen and CPAP -Manage CHF as above   Obstructive sleep apnea -Continue CPAP at night.   Hypertension: Normotensive for most part. -Cardiac meds as above.   IDDM-2 with hyperglycemia and HLD: Seems he is on Lantus, NovoLog and Victoza at home.  A1c 9.1%. Recent Labs  Lab 02/28/22 1626 02/28/22 2058 03/01/22 0414 03/01/22 0655 03/01/22 1123  GLUCAP 232* 239* 189* 199* 179*  -Change SSI to resistant scale -Increase NovoLog from 4 to 6 units 3 times daily with  meals -Continue Semglee 15 units daily -Continue home Crestor.  Mood disorder: -Continue home meds.  Hypokalemia/hyperphosphatemia: -Monitor replenish as appropriate  Left ear pain: small tissue growth with small pus looking tip over posterior wall of external ear canal on otoscopic exam. -Continue home antibiotic drop  Euthyroid sick syndrome: TSH low but free T4 normal. -Repeat TFT in 4 to 6 weeks.  Morbid obesity Body mass index is 48.23 kg/m. -Continue Victoza on discharge          DVT prophylaxis:  heparin injection 5,000 Units Start: 02/26/22 0630 SCDs Start: 02/26/22 8527  Code Status: Full code Family Communication: None at bedside Level of care: Telemetry Cardiac Status is: Inpatient The patient will remain inpatient because: Acute combined CHF/fluid overload requiring IV Lasix   Final disposition: Likely home once cleared by consultants Consultants:  Cardiology  Sch Meds:  Scheduled Meds:  carvedilol  6.25 mg Oral BID WC   clopidogrel  75 mg Oral Daily   dapagliflozin propanediol  10 mg Oral Daily   DULoxetine  90 mg Oral Daily   furosemide  80 mg Intravenous Daily   heparin  5,000 Units Subcutaneous Q8H   insulin aspart  0-20 Units Subcutaneous TID WC   insulin aspart  0-5 Units Subcutaneous QHS   insulin aspart  6 Units Subcutaneous TID WC   insulin glargine-yfgn  15 Units Subcutaneous Daily   losartan  25 mg Oral Daily   neomycin-polymyxin-hydrocortisone  4 drop Left EAR QID   pantoprazole  40 mg Oral Daily   prazosin  2 mg Oral QHS   rosuvastatin  20 mg Oral Daily   sodium chloride flush  3 mL Intravenous Q12H   spironolactone  25 mg Oral Daily   traZODone  200 mg Oral QHS   Continuous Infusions:  sodium chloride     PRN Meds:.sodium chloride, acetaminophen, hydrOXYzine, ipratropium-albuterol, labetalol, ondansetron (ZOFRAN) IV, sodium chloride flush  Antimicrobials: Anti-infectives (From admission, onward)    None        I have  personally reviewed the following labs and images: CBC: Recent Labs  Lab 02/26/22 0315 02/27/22 0450 02/28/22 0308 03/01/22 0234  WBC 10.6* 9.2 7.6 7.9  NEUTROABS 7.1  --   --   --   HGB 11.7* 12.5* 12.7* 13.5  HCT 35.7* 38.2* 37.9* 41.6  MCV 89.5 88.6 87.3 88.9  PLT 249 261 258 278   BMP &GFR Recent Labs  Lab 02/26/22 0315 02/27/22 0450 02/28/22 0308 03/01/22 0234  NA 139 139 141 138  K 3.7 3.2* 4.2 3.9  CL 106 99 103 101  CO2 25 28 27 28   GLUCOSE 166* 140* 250* 187*  BUN 9 13 18 17   CREATININE 0.87 1.01 1.05 0.98  CALCIUM 8.6* 8.9 9.2 9.2  MG  --  2.1 2.3 2.3  PHOS  --  5.2* 4.9* 5.2*   Estimated Creatinine Clearance: 112.2 mL/min (by C-G formula based on SCr  of 0.98 mg/dL). Liver & Pancreas: Recent Labs  Lab 02/27/22 0450 02/28/22 0308 03/01/22 0234  AST 22  --   --   ALT 20  --   --   ALKPHOS 45  --   --   BILITOT 0.5  --   --   PROT 7.0  --   --   ALBUMIN 3.4* 3.4* 3.5   No results for input(s): "LIPASE", "AMYLASE" in the last 168 hours. No results for input(s): "AMMONIA" in the last 168 hours. Diabetic: No results for input(s): "HGBA1C" in the last 72 hours.  Recent Labs  Lab 02/28/22 1626 02/28/22 2058 03/01/22 0414 03/01/22 0655 03/01/22 1123  GLUCAP 232* 239* 189* 199* 179*   Cardiac Enzymes: No results for input(s): "CKTOTAL", "CKMB", "CKMBINDEX", "TROPONINI" in the last 168 hours. No results for input(s): "PROBNP" in the last 8760 hours. Coagulation Profile: No results for input(s): "INR", "PROTIME" in the last 168 hours. Thyroid Function Tests: Recent Labs    03/01/22 0918  TSH 0.117*  FREET4 1.11   Lipid Profile: No results for input(s): "CHOL", "HDL", "LDLCALC", "TRIG", "CHOLHDL", "LDLDIRECT" in the last 72 hours. Anemia Panel: No results for input(s): "VITAMINB12", "FOLATE", "FERRITIN", "TIBC", "IRON", "RETICCTPCT" in the last 72 hours. Urine analysis:    Component Value Date/Time   COLORURINE YELLOW 08/05/2021 1429    APPEARANCEUR CLEAR 08/05/2021 1429   LABSPEC 1.030 08/05/2021 1429   PHURINE 5.0 08/05/2021 1429   GLUCOSEU >=500 (A) 08/05/2021 1429   HGBUR SMALL (A) 08/05/2021 1429   BILIRUBINUR NEGATIVE 08/05/2021 1429   KETONESUR NEGATIVE 08/05/2021 1429   PROTEINUR NEGATIVE 08/05/2021 1429   NITRITE NEGATIVE 08/05/2021 1429   LEUKOCYTESUR NEGATIVE 08/05/2021 1429   Sepsis Labs: Invalid input(s): "PROCALCITONIN", "LACTICIDVEN"  Microbiology: No results found for this or any previous visit (from the past 240 hour(s)).  Radiology Studies: No results found.    Cael Worth T. Preston Weill [facility]  If 7PM-7AM, please contact night-coverage [email or web address] 03/01/2022, 2:56 PM

## 2022-03-01 NOTE — Progress Notes (Signed)
Rounding Note    Patient Name: Luis [name] Date of Encounter: 03/01/2022  [facility] Cardiologist: Bryan Lemma, MD/[facility]  Subjective   Still feels like "lungs are on fire". "Elephant on chest" yesterday evening, trops wnl.   Inpatient Medications    Scheduled Meds:  carvedilol  6.25 mg Oral BID WC   clopidogrel  75 mg Oral Daily   dapagliflozin propanediol  10 mg Oral Daily   DULoxetine  90 mg Oral Daily   furosemide  80 mg Intravenous Daily   heparin  5,000 Units Subcutaneous Q8H   insulin aspart  0-15 Units Subcutaneous TID WC   insulin aspart  0-5 Units Subcutaneous QHS   insulin aspart  4 Units Subcutaneous TID WC   insulin glargine-yfgn  15 Units Subcutaneous Daily   losartan  25 mg Oral Daily   neomycin-polymyxin-hydrocortisone  4 drop Left EAR QID   pantoprazole  40 mg Oral Daily   prazosin  2 mg Oral QHS   rosuvastatin  20 mg Oral Daily   sodium chloride flush  3 mL Intravenous Q12H   traZODone  200 mg Oral QHS   Continuous Infusions:  sodium chloride     PRN Meds: sodium chloride, acetaminophen, ipratropium-albuterol, labetalol, ondansetron (ZOFRAN) IV, sodium chloride flush   Vital Signs    Vitals:   03/01/22 0012 03/01/22 0417 03/01/22 0500 03/01/22 0900  BP: 128/76 (!) 145/80  (!) 119/103  Pulse: 85 87  (!) 101  Resp: 18 17  (!) 21  Temp: 98.2 F (36.8 C) 97.8 F (36.6 C)  97.8 F (36.6 C)  TempSrc: Oral Oral  Oral  SpO2: 93% 95%  99%  Weight:   133.5 kg   Height:        Intake/Output Summary (Last 24 hours) at 03/01/2022 1009 Last data filed at 03/01/2022 0935 Gross per 24 hour  Intake 1193 ml  Output 2900 ml  Net -1707 ml      03/01/2022    5:00 AM 02/28/2022    5:42 AM 02/27/2022    5:10 AM  Last 3 Weights  Weight (lbs) 294 lb 5 oz 298 lb 1.6 oz 298 lb 4.8 oz  Weight (kg) 133.5 kg 135.217 kg 135.308 kg      Telemetry    Paced - Personally Reviewed  ECG    ASVP- Personally Reviewed  Physical Exam   GEN:  No acute distress.   Neck: No JVD Cardiac: RRR, no murmurs, rubs, or gallops.  Respiratory: crackles in bases GI: Soft, nontender, non-distended  MS: mild edema; No deformity. Neuro:  Nonfocal  Psych: Normal affect   Labs    High Sensitivity Troponin:   Recent Labs  Lab 02/26/22 0739 02/27/22 2012 02/27/22 2256 02/28/22 1859 02/28/22 2030  TROPONINIHS 12 19* 20* 14 15     Chemistry Recent Labs  Lab 02/27/22 0450 02/28/22 0308 03/01/22 0234  NA 139 141 138  K 3.2* 4.2 3.9  CL 99 103 101  CO2 28 27 28   GLUCOSE 140* 250* 187*  BUN 13 18 17   CREATININE 1.01 1.05 0.98  CALCIUM 8.9 9.2 9.2  MG 2.1 2.3 2.3  PROT 7.0  --   --   ALBUMIN 3.4* 3.4* 3.5  AST 22  --   --   ALT 20  --   --   ALKPHOS 45  --   --   BILITOT 0.5  --   --   GFRNONAA >60 >60 >60  ANIONGAP 12 11  9    Lipids No results for input(s): "CHOL", "TRIG", "HDL", "LABVLDL", "LDLCALC", "CHOLHDL" in the last 168 hours.  Hematology Recent Labs  Lab 02/27/22 0450 02/28/22 0308 03/01/22 0234  WBC 9.2 7.6 7.9  RBC 4.31 4.34 4.68  HGB 12.5* 12.7* 13.5  HCT 38.2* 37.9* 41.6  MCV 88.6 87.3 88.9  MCH 29.0 29.3 28.8  MCHC 32.7 33.5 32.5  RDW 13.3 13.2 13.2  PLT 261 258 278   Thyroid  Recent Labs  Lab 03/01/22 0234  TSH 0.151*    BNP Recent Labs  Lab 02/26/22 0315 02/27/22 0450 02/28/22 0308  BNP 136.5* 100.7* 87.4    DDimer No results for input(s): "DDIMER" in the last 168 hours.   Radiology    ECHOCARDIOGRAM COMPLETE  Result Date: 02/27/2022    ECHOCARDIOGRAM REPORT   Patient Name:   Luis [name] Date of Exam: 02/27/2022 Medical Rec #:  [id number]             Height:       65.5 in Accession #:    [id number]            Weight:       298.3 lb Date of Birth:  05-02-1971              BSA:          2.358 m Patient Age:    51 years              BP:           141/60 mmHg Patient Gender: M                     HR:           93 bpm. Exam Location:  Inpatient Procedure: 2D Echo                               MODIFIED REPORT: This report was modified by Weston Brass MD on 02/27/2022 due to revision.  Indications:     CHF  History:         Patient has prior history of Echocardiogram examinations, most                  recent 01/28/2021. CHF, Pacemaker; Risk Factors:Dyslipidemia,                  Diabetes, Hypertension, Current Smoker and cocaine abuse.  Sonographer:     Cathie Hoops Referring Phys:  [id number] ERIC CHEN Diagnosing Phys: Weston Brass MD  Sonographer Comments: Technically difficult study due to poor echo windows and patient is obese. Image acquisition challenging due to patient body habitus, Image acquisition challenging due to uncooperative patient and refused ultrasound contrast. IMPRESSIONS  1. Left ventricular ejection fraction, by estimation, is 25%. The left ventricle has severely decreased function. The left ventricle demonstrates global hypokinesis. Cannot comment on regional wall motion abnormalities and Definity contrast was not attempted. Consider repeat limited echo for RWM assessment if clinically indicated. The left ventricular internal cavity size was mildly dilated. Left ventricular diastolic parameters are indeterminate.  2. Right ventricular systolic function is mildly reduced. The right ventricular size is normal. Tricuspid regurgitation signal is inadequate for assessing PA pressure.  3. The mitral valve is grossly normal. Trivial mitral valve regurgitation. No evidence of mitral stenosis.  4. The aortic valve is grossly normal. There is mild calcification of the aortic valve.  Aortic valve regurgitation is not visualized. No aortic stenosis is present.  5. The inferior vena cava is normal in size with greater than 50% respiratory variability, suggesting right atrial pressure of 3 mmHg. FINDINGS  Left Ventricle: Left ventricular ejection fraction, by estimation, is 25%. The left ventricle has severely decreased function. The left ventricle demonstrates global hypokinesis. The left  ventricular internal cavity size was mildly dilated. There is no left ventricular hypertrophy. Left ventricular diastolic parameters are indeterminate. Right Ventricle: The right ventricular size is normal. No increase in right ventricular wall thickness. Right ventricular systolic function is mildly reduced. Tricuspid regurgitation signal is inadequate for assessing PA pressure. Left Atrium: Left atrial size was normal in size. Right Atrium: Right atrial size was normal in size. Pericardium: There is no evidence of pericardial effusion. Mitral Valve: The mitral valve is grossly normal. Trivial mitral valve regurgitation. No evidence of mitral valve stenosis. Tricuspid Valve: The tricuspid valve is normal in structure. Tricuspid valve regurgitation is not demonstrated. No evidence of tricuspid stenosis. Aortic Valve: The aortic valve is grossly normal. There is mild calcification of the aortic valve. Aortic valve regurgitation is not visualized. No aortic stenosis is present. Aortic valve mean gradient measures 4.0 mmHg. Aortic valve peak gradient measures 7.0 mmHg. Aortic valve area, by VTI measures 2.22 cm. Pulmonic Valve: The pulmonic valve was normal in structure. Pulmonic valve regurgitation is trivial. No evidence of pulmonic stenosis. Aorta: The aortic root is normal in size and structure. Venous: The inferior vena cava is normal in size with greater than 50% respiratory variability, suggesting right atrial pressure of 3 mmHg. IAS/Shunts: The interatrial septum was not well visualized. Additional Comments: A device lead is visualized in the right atrium and right ventricle.  LEFT VENTRICLE PLAX 2D LVIDd:         5.90 cm   Diastology LVIDs:         4.65 cm   LV e' medial:    8.49 cm/s LV PW:         1.20 cm   LV E/e' medial:  10.4 LV IVS:        1.20 cm   LV e' lateral:   5.11 cm/s LVOT diam:     2.10 cm   LV E/e' lateral: 17.2 LV SV:         52 LV SV Index:   22 LVOT Area:     3.46 cm  RIGHT VENTRICLE RV  Basal diam:  3.60 cm RV Mid diam:    3.50 cm RV S prime:     17.10 cm/s TAPSE (M-mode): 1.8 cm LEFT ATRIUM             Index        RIGHT ATRIUM           Index LA diam:        4.10 cm 1.74 cm/m   RA Area:     10.40 cm LA Vol (A2C):   66.4 ml 28.16 ml/m  RA Volume:   20.90 ml  8.86 ml/m LA Vol (A4C):   40.9 ml 17.35 ml/m LA Biplane Vol: 52.1 ml 22.10 ml/m  AORTIC VALVE                    PULMONIC VALVE AV Area (Vmax):    2.43 cm     PV Vmax:       1.05 m/s AV Area (Vmean):   2.40 cm     PV Peak grad:  4.4 mmHg AV Area (VTI):     2.22 cm AV Vmax:           132.00 cm/s AV Vmean:          88.200 cm/s AV VTI:            0.232 m AV Peak Grad:      7.0 mmHg AV Mean Grad:      4.0 mmHg LVOT Vmax:         92.70 cm/s LVOT Vmean:        61.000 cm/s LVOT VTI:          0.149 m LVOT/AV VTI ratio: 0.64  AORTA Ao Root diam: 3.00 cm Ao Asc diam:  3.00 cm MITRAL VALVE MV Area (PHT): 6.43 cm    SHUNTS MV Decel Time: 118 msec    Systemic VTI:  0.15 m MR Peak grad: 18.1 mmHg    Systemic Diam: 2.10 cm MR Vmax:      212.50 cm/s MV E velocity: 88.10 cm/s MV A velocity: 44.10 cm/s MV E/A ratio:  2.00 Cherlynn Kaiser MD Electronically signed by Cherlynn Kaiser MD Signature Date/Time: 02/27/2022/4:45:50 PM    Final (Updated)     Cardiac Studies   Echo above  Patient Profile     51 y.o. male with a hx of chronic systolic heart failure, nonischemic cardiomyopathy, status post biventricular ICD, chronic hypoxic respiratory failure on 2.5 L nasal cannula, OSA on CPAP, hypertension, hyperlipidemia, type 2 diabetes, cocaine abuse, tobacco abuse, morbid obesity, PTSD, chronic anxiety, depression, who is being seen 02/28/2022 for the evaluation of CHF   Assessment & Plan    Principal Problem:   Acute on chronic combined systolic and diastolic CHF (congestive heart failure) (HCC) Active Problems:   Pacemaker   HLD (hyperlipidemia)   Diabetes mellitus without complication (HCC)   Hypertension   Tobacco abuse   Morbid obesity  (HCC)   Sleep apnea   Cocaine abuse (HCC)   Chronic respiratory failure with hypoxia (HCC) - 2.5 L/min  Acute on chronic systolic and diastolic heart failure Nonischemic cardiomyopathy -Presented with 1 week onset shortness of breath, leg edema -LVEF 25% on echo 02/27/2022, dropped from 30 to 35% on 01/28/2021 echo -UDS positive for cocaine on 02/26/2022 -Net -6.7 L, weight is down from 300 to 293 pounds since admission -Clinically mildy hypervolemic  -Recommend continue IV Lasix 80 mg daily for another 24-48 hr, cr improving. Heart Failure Therapy ACE-I/ARB/ARNI: losartan 25 mg daily (cough with lisinopril) >> tx to Entresto if tolerated as outpt BB: carvedilol 6.25 mg BID. Stopped concomintant metoprolol and counseled pt. MRA: will add spironoactone 25 mg daily today.  SGLT2I: farxiga 10 mg daily  History of ventricular tachycardia -PPM is approaching end-of-battery life, will need generator change by primary EP cardiology at [facility] upon discharge   Cocaine abuse - encourage cessation   Anxiety with depression Type 2 diabetes BPH HTN Hyperlipidemia -Per primary team       For questions or updates, please contact [place] Please consult [email or web address] for contact info under        Signed, Elouise Munroe, MD  03/01/2022, 10:09 AM

## 2022-03-01 NOTE — Plan of Care (Signed)
  Problem: Clinical Measurements: Goal: Respiratory complications will improve Outcome: Progressing   

## 2022-03-01 NOTE — Progress Notes (Signed)
Placed patient on CPAP for the night with oxygen set at 3lpm.  

## 2022-03-02 DIAGNOSIS — F1410 Cocaine abuse, uncomplicated: Secondary | ICD-10-CM | POA: Diagnosis not present

## 2022-03-02 DIAGNOSIS — J9611 Chronic respiratory failure with hypoxia: Secondary | ICD-10-CM | POA: Diagnosis not present

## 2022-03-02 DIAGNOSIS — I5043 Acute on chronic combined systolic (congestive) and diastolic (congestive) heart failure: Secondary | ICD-10-CM | POA: Diagnosis not present

## 2022-03-02 DIAGNOSIS — E119 Type 2 diabetes mellitus without complications: Secondary | ICD-10-CM | POA: Diagnosis not present

## 2022-03-02 LAB — CBC
HCT: 40.3 % (ref 39.0–52.0)
Hemoglobin: 13.1 g/dL (ref 13.0–17.0)
MCH: 28.5 pg (ref 26.0–34.0)
MCHC: 32.5 g/dL (ref 30.0–36.0)
MCV: 87.6 fL (ref 80.0–100.0)
Platelets: 293 10*3/uL (ref 150–400)
RBC: 4.6 MIL/uL (ref 4.22–5.81)
RDW: 13.1 % (ref 11.5–15.5)
WBC: 8.1 10*3/uL (ref 4.0–10.5)
nRBC: 0 % (ref 0.0–0.2)

## 2022-03-02 LAB — RENAL FUNCTION PANEL
Albumin: 3.5 g/dL (ref 3.5–5.0)
Anion gap: 10 (ref 5–15)
BUN: 23 mg/dL — ABNORMAL HIGH (ref 6–20)
CO2: 27 mmol/L (ref 22–32)
Calcium: 9 mg/dL (ref 8.9–10.3)
Chloride: 102 mmol/L (ref 98–111)
Creatinine, Ser: 1.21 mg/dL (ref 0.61–1.24)
GFR, Estimated: >60 mL/min (ref >60)
Glucose, Bld: 190 mg/dL — ABNORMAL HIGH (ref 70–99)
Phosphorus: 5.4 mg/dL — ABNORMAL HIGH (ref 2.5–4.6)
Potassium: 3.8 mmol/L (ref 3.5–5.1)
Sodium: 139 mmol/L (ref 135–145)

## 2022-03-02 LAB — GLUCOSE, CAPILLARY
Glucose-Capillary: 173 mg/dL — ABNORMAL HIGH (ref 70–99)
Glucose-Capillary: 156 mg/dL — ABNORMAL HIGH (ref 70–99)
Glucose-Capillary: 132 mg/dL — ABNORMAL HIGH (ref 70–99)
Glucose-Capillary: 245 mg/dL — ABNORMAL HIGH (ref 70–99)

## 2022-03-02 LAB — MAGNESIUM: Magnesium: 2.2 mg/dL (ref 1.7–2.4)

## 2022-03-02 MED ORDER — INSULIN GLARGINE-YFGN 100 UNIT/ML ~~LOC~~ SOLN
25.0000 [IU] | Freq: Every day | SUBCUTANEOUS | Status: DC
  Administered 2022-03-03 – 2022-03-05 (×3): 25 [IU] via SUBCUTANEOUS
  Filled 2022-03-02 (×3): qty 0.25

## 2022-03-02 MED ORDER — INSULIN GLARGINE-YFGN 100 UNIT/ML ~~LOC~~ SOLN
10.0000 [IU] | Freq: Every day | SUBCUTANEOUS | Status: AC
Start: 2022-03-02 — End: 2022-03-02
  Administered 2022-03-02: 10 [IU] via SUBCUTANEOUS
  Filled 2022-03-02: qty 0.1

## 2022-03-02 NOTE — Progress Notes (Signed)
Cardiology Progress Note  Patient ID: Luis [name] MRN: [id number] DOB: 02-08-1971 Date of Encounter: 03/02/2022  Primary Cardiologist: Bryan Lemma, MD  Subjective   Chief Complaint: SOB  HPI: Good diuresis.  Reports he still feels congested.  With cough this morning.  Reports cough is worse with laying flat.  ROS:  All other ROS reviewed and negative. Pertinent positives noted in the HPI.     Inpatient Medications  Scheduled Meds:  carvedilol  6.25 mg Oral BID WC   clopidogrel  75 mg Oral Daily   dapagliflozin propanediol  10 mg Oral Daily   DULoxetine  90 mg Oral Daily   furosemide  80 mg Intravenous Daily   heparin  5,000 Units Subcutaneous Q8H   insulin aspart  0-20 Units Subcutaneous TID WC   insulin aspart  0-5 Units Subcutaneous QHS   insulin aspart  6 Units Subcutaneous TID WC   insulin glargine-yfgn  15 Units Subcutaneous Daily   losartan  25 mg Oral Daily   neomycin-polymyxin-hydrocortisone  4 drop Left EAR QID   pantoprazole  40 mg Oral Daily   prazosin  2 mg Oral QHS   rosuvastatin  20 mg Oral Daily   sodium chloride flush  3 mL Intravenous Q12H   spironolactone  25 mg Oral Daily   traZODone  200 mg Oral QHS   Continuous Infusions:  sodium chloride     PRN Meds: sodium chloride, acetaminophen, hydrOXYzine, ipratropium-albuterol, labetalol, ondansetron (ZOFRAN) IV, sodium chloride flush   Vital Signs   Vitals:   03/01/22 2308 03/02/22 0405 03/02/22 0534 03/02/22 0651  BP:  129/74  (!) 143/60  Pulse: 90 79  84  Resp: 18 18    Temp:  97.9 F (36.6 C)    TempSrc:  Axillary    SpO2: 94% 94%    Weight:   134.2 kg   Height:        Intake/Output Summary (Last 24 hours) at 03/02/2022 0837 Last data filed at 03/02/2022 0540 Gross per 24 hour  Intake 1548 ml  Output 3725 ml  Net -2177 ml      03/02/2022    5:34 AM 03/01/2022    5:00 AM 02/28/2022    5:42 AM  Last 3 Weights  Weight (lbs) 295 lb 14.4 oz 294 lb 5 oz 298 lb 1.6 oz  Weight (kg)  134.219 kg 133.5 kg 135.217 kg      Telemetry  Overnight telemetry shows V paced rhythm in the low 100s, which I personally reviewed.  Physical Exam   Vitals:   03/01/22 2308 03/02/22 0405 03/02/22 0534 03/02/22 0651  BP:  129/74  (!) 143/60  Pulse: 90 79  84  Resp: 18 18    Temp:  97.9 F (36.6 C)    TempSrc:  Axillary    SpO2: 94% 94%    Weight:   134.2 kg   Height:        Intake/Output Summary (Last 24 hours) at 03/02/2022 0837 Last data filed at 03/02/2022 0540 Gross per 24 hour  Intake 1548 ml  Output 3725 ml  Net -2177 ml       03/02/2022    5:34 AM 03/01/2022    5:00 AM 02/28/2022    5:42 AM  Last 3 Weights  Weight (lbs) 295 lb 14.4 oz 294 lb 5 oz 298 lb 1.6 oz  Weight (kg) 134.219 kg 133.5 kg 135.217 kg    Body mass index is 48.49 kg/m.  General: Well nourished, well  developed, in no acute distress Head: Atraumatic, normal size  Eyes: PEERLA, EOMI  Neck: Supple, no JVD Endocrine: No thryomegaly Cardiac: Normal S1, S2; RRR; no murmurs, rubs, or gallops Lungs: Clear to auscultation bilaterally, no wheezing, rhonchi or rales  Abd: Soft, nontender, no hepatomegaly  Ext: No edema, pulses 2+ Musculoskeletal: No deformities, BUE and BLE strength normal and equal Skin: Warm and dry, no rashes   Neuro: Alert and oriented to person, place, time, and situation, CNII-XII grossly intact, no focal deficits  Psych: Normal mood and affect   Labs  High Sensitivity Troponin:   Recent Labs  Lab 02/26/22 0739 02/27/22 2012 02/27/22 2256 02/28/22 1859 02/28/22 2030  TROPONINIHS 12 19* 20* 14 15     Cardiac EnzymesNo results for input(s): "TROPONINI" in the last 168 hours. No results for input(s): "TROPIPOC" in the last 168 hours.  Chemistry Recent Labs  Lab 02/27/22 0450 02/28/22 0308 03/01/22 0234 03/02/22 0225  NA 139 141 138 139  K 3.2* 4.2 3.9 3.8  CL 99 103 101 102  CO2 28 27 28 27   GLUCOSE 140* 250* 187* 190*  BUN 13 18 17  23*  CREATININE 1.01 1.05 0.98 1.21   CALCIUM 8.9 9.2 9.2 9.0  PROT 7.0  --   --   --   ALBUMIN 3.4* 3.4* 3.5 3.5  AST 22  --   --   --   ALT 20  --   --   --   ALKPHOS 45  --   --   --   BILITOT 0.5  --   --   --   GFRNONAA >60 >60 >60 >60  ANIONGAP 12 11 9 10     Hematology Recent Labs  Lab 02/28/22 0308 03/01/22 0234 03/02/22 0225  WBC 7.6 7.9 8.1  RBC 4.34 4.68 4.60  HGB 12.7* 13.5 13.1  HCT 37.9* 41.6 40.3  MCV 87.3 88.9 87.6  MCH 29.3 28.8 28.5  MCHC 33.5 32.5 32.5  RDW 13.2 13.2 13.1  PLT 258 278 293   BNP Recent Labs  Lab 02/26/22 0315 02/27/22 0450 02/28/22 0308  BNP 136.5* 100.7* 87.4    DDimer No results for input(s): "DDIMER" in the last 168 hours.   Radiology  No results found.  Cardiac Studies  TTE 02/27/2022  1. Left ventricular ejection fraction, by estimation, is 25%. The left  ventricle has severely decreased function. The left ventricle demonstrates  global hypokinesis. Cannot comment on regional wall motion abnormalities  and Definity contrast was not  attempted. Consider repeat limited echo for RWM assessment if clinically  indicated. The left ventricular internal cavity size was mildly dilated.  Left ventricular diastolic parameters are indeterminate.   2. Right ventricular systolic function is mildly reduced. The right  ventricular size is normal. Tricuspid regurgitation signal is inadequate  for assessing PA pressure.   3. The mitral valve is grossly normal. Trivial mitral valve  regurgitation. No evidence of mitral stenosis.   4. The aortic valve is grossly normal. There is mild calcification of the  aortic valve. Aortic valve regurgitation is not visualized. No aortic  stenosis is present.   5. The inferior vena cava is normal in size with greater than 50%  respiratory variability, suggesting right atrial pressure of 3 mmHg.   Patient Profile  Luis [name] is a 51 y.o. male with nonischemic cardiomyopathy, chronic systolic heart failure status post BiV ICD,  chronic respiratory failure on 2 L nasal cannula, OSA, diabetes, cocaine abuse who was  admitted on 02/26/2022 for acute on chronic systolic heart failure.  Assessment & Plan   #Acute on chronic systolic heart failure, EF 25% #Nonischemic cardiomyopathy -Reports she is still short of breath.  Creatinine is stable.  Does not appear that volume up.  Net -9 L since admission. -Still reports shortness of breath.  He is obese.  Volume status is difficult to assess.  We will give IV Lasix today and then transition oral diuretics tomorrow. -Continue carvedilol 6.25 mg twice daily.  On Farxiga 10 mg daily.  Currently on losartan 25 mg daily Aldactone 25 mg daily.  Consider Entresto as an outpatient. -Cocaine positive.  Needs to refrain from this.  #Sinus tachycardia -Telemetry shows mild tachycardia this morning.  Appears to be in sinus rhythm that is V-paced. -We will continue beta-blocker for now.  #ICD, approaching end-of-life -Needs generator exchange.  Will be done at discharge by [facility].  He follows with them.    For questions or updates, please contact [facility] Please consult [email or web address] for contact info under   Signed, Gerri Spore T. Flora Lipps, MD, [facility] [place]  [facility]  03/02/2022 8:37 AM

## 2022-03-02 NOTE — TOC Transition Note (Signed)
Transition of Care [facility]) - CM/SW Discharge Note   Patient Details  Name: Luis [name] MRN: [id number] Date of Birth: [birth year]  [facility] CM/SW Contact:  Harriet Masson, RN Phone Number: 03/02/2022, 3:30 PM   Clinical Narrative:    Order for rollator. [name] with [employer] was unable to complete order due to patient receiving walker lin 4/23. Patient is aware.  [facility]  Patient states he will need transportation home at discharge.      Barriers to Discharge: Continued Medical Work up   Patient Goals and CMS Choice        Discharge Placement                       Discharge Plan and Services                          HH Arranged: PT [facility] Agency: [facility] Date [facility] Agency Contacted: 02/28/22 Time HH Agency Contacted: 1000 Representative spoke with at [facility] Agency: Elnita Maxwell  Social Determinants of Health (SDOH) Interventions Food Insecurity Interventions: Intervention Not Indicated Housing Interventions: Intervention Not Indicated Transportation Interventions: Intervention Not Indicated Financial Strain Interventions: Intervention Not Indicated   Readmission Risk Interventions    01/31/2021   12:26 PM 01/31/2021    9:25 AM  Readmission Risk Prevention Plan  Transportation Screening Complete   PCP or Specialist Appt within 3-5 Days Complete Complete  HRI or Home Care Consult Complete Complete  Social Work Consult for Recovery Care Planning/Counseling Patient refused Complete  Palliative Care Screening Not Applicable Not Applicable  Medication Review Oceanographer) Complete Referral to Pharmacy

## 2022-03-02 NOTE — Progress Notes (Signed)
Rounding Note    Patient Name: Luis [name] Date of Encounter: 03/03/2022  [facility] Cardiologist: Bryan Lemma, MD   Subjective   Complains of persistent cough and SOB when laying flat. States he feels like something is "sitting on his chest when he lays down." States he feels like he still has a "lot of fluid on board."  Cr stable 1.2>1.08 Net negative 2.6L Wt 295>292lbs  Inpatient Medications    Scheduled Meds:  carvedilol  6.25 mg Oral BID WC   clopidogrel  75 mg Oral Daily   dapagliflozin propanediol  10 mg Oral Daily   DULoxetine  90 mg Oral Daily   heparin  5,000 Units Subcutaneous Q8H   insulin aspart  0-20 Units Subcutaneous TID WC   insulin aspart  0-5 Units Subcutaneous QHS   insulin aspart  6 Units Subcutaneous TID WC   insulin glargine-yfgn  25 Units Subcutaneous Daily   losartan  25 mg Oral Daily   neomycin-polymyxin-hydrocortisone  4 drop Left EAR QID   pantoprazole  40 mg Oral Daily   prazosin  2 mg Oral QHS   rosuvastatin  20 mg Oral Daily   sodium chloride flush  3 mL Intravenous Q12H   spironolactone  25 mg Oral Daily   traZODone  200 mg Oral QHS   Continuous Infusions:  sodium chloride     PRN Meds: sodium chloride, acetaminophen, hydrOXYzine, ipratropium-albuterol, labetalol, ondansetron (ZOFRAN) IV, sodium chloride flush   Vital Signs    Vitals:   03/02/22 1921 03/02/22 2128 03/03/22 0516 03/03/22 0622  BP: (!) 120/59 132/67 138/68   Pulse: 93  86   Resp: 20  18   Temp: 97.9 F (36.6 C)  97.9 F (36.6 C)   TempSrc:   Oral   SpO2: 94%  95%   Weight:    132.5 kg  Height:        Intake/Output Summary (Last 24 hours) at 03/03/2022 0909 Last data filed at 03/03/2022 5188 Gross per 24 hour  Intake 1317 ml  Output 3875 ml  Net -2558 ml      03/03/2022    6:22 AM 03/02/2022    5:34 AM 03/01/2022    5:00 AM  Last 3 Weights  Weight (lbs) 292 lb 1.6 oz 295 lb 14.4 oz 294 lb 5 oz  Weight (kg) 132.496 kg 134.219 kg 133.5  kg      Telemetry    A-sensed, v-paced - Personally Reviewed  ECG    No new tracing - Personally Reviewed  Physical Exam   GEN: Sitting up in a chair, comfortable Neck: JVD difficult to assess due to body habitus Cardiac: RRR, no murmurs, rubs, or gallops.  Respiratory: Diminished but clear GI: Soft, nontender, non-distended  MS: No edema; No deformity. Neuro:  Nonfocal  Psych: Normal affect   Labs    High Sensitivity Troponin:   Recent Labs  Lab 02/26/22 0739 02/27/22 2012 02/27/22 2256 02/28/22 1859 02/28/22 2030  TROPONINIHS 12 19* 20* 14 15     Chemistry Recent Labs  Lab 02/27/22 0450 02/28/22 0308 03/01/22 0234 03/02/22 0225 03/03/22 0228  NA 139   < > 138 139 136  K 3.2*   < > 3.9 3.8 3.8  CL 99   < > 101 102 101  CO2 28   < > 28 27 26   GLUCOSE 140*   < > 187* 190* 196*  BUN 13   < > 17 23* 25*  CREATININE 1.01   < >  0.98 1.21 1.08  CALCIUM 8.9   < > 9.2 9.0 9.0  MG 2.1   < > 2.3 2.2 2.4  PROT 7.0  --   --   --   --   ALBUMIN 3.4*   < > 3.5 3.5 3.7  AST 22  --   --   --   --   ALT 20  --   --   --   --   ALKPHOS 45  --   --   --   --   BILITOT 0.5  --   --   --   --   GFRNONAA >60   < > >60 >60 >60  ANIONGAP 12   < > 9 10 9    < > = values in this interval not displayed.    Lipids No results for input(s): "CHOL", "TRIG", "HDL", "LABVLDL", "LDLCALC", "CHOLHDL" in the last 168 hours.  Hematology Recent Labs  Lab 03/01/22 0234 03/02/22 0225 03/03/22 0228  WBC 7.9 8.1 9.0  RBC 4.68 4.60 4.70  HGB 13.5 13.1 13.8  HCT 41.6 40.3 40.9  MCV 88.9 87.6 87.0  MCH 28.8 28.5 29.4  MCHC 32.5 32.5 33.7  RDW 13.2 13.1 12.9  PLT 278 293 268   Thyroid  Recent Labs  Lab 03/01/22 0918  TSH 0.117*  FREET4 1.11    BNP Recent Labs  Lab 02/27/22 0450 02/28/22 0308 03/03/22 0228  BNP 100.7* 87.4 62.2    DDimer No results for input(s): "DDIMER" in the last 168 hours.   Radiology    No results found.  Cardiac Studies   TTE 02/27/2022  1.  Left ventricular ejection fraction, by estimation, is 25%. The left  ventricle has severely decreased function. The left ventricle demonstrates  global hypokinesis. Cannot comment on regional wall motion abnormalities  and Definity contrast was not  attempted. Consider repeat limited echo for RWM assessment if clinically  indicated. The left ventricular internal cavity size was mildly dilated.  Left ventricular diastolic parameters are indeterminate.   2. Right ventricular systolic function is mildly reduced. The right  ventricular size is normal. Tricuspid regurgitation signal is inadequate  for assessing PA pressure.   3. The mitral valve is grossly normal. Trivial mitral valve  regurgitation. No evidence of mitral stenosis.   4. The aortic valve is grossly normal. There is mild calcification of the  aortic valve. Aortic valve regurgitation is not visualized. No aortic  stenosis is present.   5. The inferior vena cava is normal in size with greater than 50%  respiratory variability, suggesting right atrial pressure of 3 mmHg.   Patient Profile     51 y.o. male with history of nonischemic cardiomyopathy, chronic systolic heart failure status post BiV ICD, chronic respiratory failure on 2 L nasal cannula, OSA, diabetes, cocaine abuse who was admitted on 02/26/2022 for acute on chronic systolic heart failure.  Assessment & Plan    #Acute on Chronic Systolic HF, EF 25%: #Non-ischemic CM: Patient presented with worsening chest pain and SOB found to be overloaded on exam with pulmonary edema on CXR consistent with acute on chronic systolic heart failure. Has been diuresing well. Volume status difficult to assess given body habitus. Given persistent orthopnea, DOE and cough when laying flat, will plan for continued diuresis today. Patient reports dry weight is ~280lbs. -Resume diuresis with lasix 80mg  IV BID -Continue coreg 6.25mg  BID -Continue losartan 25mg  daily -Continue spiro 25mg   daily -Continue farxiga 10mg  daily -Discussed importance of  cocaine cessation -ICD is approaching end-of-life and will need generator change to be performed by [facility]  #Sinus Tachycardia: Improved with HR 80-90s. -Continue coreg 6.25mg  BID  #Chronic Respiratory Failure with Hypoxia: -Continue home O2 and CPAP  #OSA: -Continue CPAP  #DMII: -Management per IM  #Morbid Obesity: BMI 48.5.      For questions or updates, please contact [place] Please consult [email or web address] for contact info under        Signed, Freada Bergeron, MD  03/03/2022, 9:09 AM

## 2022-03-02 NOTE — Progress Notes (Signed)
PROGRESS NOTE  Luis [name] [id number] DOB: April 04, 1971   PCP: Buzzy Han, MD (Inactive)  Patient is from: Home.  DOA: 02/26/2022 LOS: 3  Chief complaints Chief Complaint  Patient presents with   Shortness of Breath   Chest Pain     Brief Narrative / Interim history: 51 year old M with PMH of combined CHF, PPM, chronic hypoxic RF on 2.5 L, OSA on CPAP, morbid obesity and ongoing cocaine use disorder presenting with progressive shortness of breath for about a week despite increasing his home Lasix, and admitted for acute on chronic combined CHF.  BNP 136.  Chest x-ray consistent with CHF.  Urine is positive for cocaine.  TTE ordered.  Patient was started on IV Lasix.  TTE with LVEF of 25% (30 to 35% in 2022)., GH but not able to assess RWMA, diastolic function.  Cardiology consulted.  Remains on IV Lasix.  Subjective: Seen and examined earlier this morning.  He continues to endorse shortness of breath.  He also reports some cough with grayish phlegm.  He denies chest pain or GI symptoms.  Objective: Vitals:   03/02/22 0405 03/02/22 0534 03/02/22 0651 03/02/22 0843  BP: 129/74  (!) 143/60   Pulse: 79  84   Resp: 18   20  Temp: 97.9 F (36.6 C)   98.6 F (37 C)  TempSrc: Axillary   Oral  SpO2: 94%     Weight:  134.2 kg    Height:        Examination:  GENERAL: No apparent distress.  Nontoxic. HEENT: MMM.  Vision and hearing grossly intact.  NECK: Supple.  Difficult to assess JVD due to body habitus. RESP:  No IWOB.  Fair aeration bilaterally but limited exam due to body habitus. CVS:  RRR. Heart sounds normal.  ABD/GI/GU: BS+. Abd soft, NTND.  MSK/EXT:  Moves extremities. No apparent deformity. No edema.  SKIN: no apparent skin lesion or wound NEURO: Awake and alert. Oriented appropriately.  No apparent focal neuro deficit. PSYCH: Calm. Normal affect.   Procedures:  None  Microbiology summarized: None  Assessment and plan: Principal  Problem:   Acute on chronic combined systolic and diastolic CHF (congestive heart failure) (HCC) Active Problems:   Cocaine abuse (HCC)   Pacemaker   HLD (hyperlipidemia)   Diabetes mellitus without complication (HCC)   Hypertension   Tobacco abuse   Morbid obesity (HCC)   Sleep apnea   Chronic respiratory failure with hypoxia (HCC) - 2.5 L/min  Acute on chronic combined CHF: TTE with LVEF of 25%.  (LVEF of 30 to 35%, GH, G2-DD in 8/22).  Presents with progressive dyspnea.  BNP 136>> 87.  CXR consistent with CHF.  UDS positive for cocaine.  Followed by [facility] cardiology.  Started on IV Lasix.  Net -9 L so far.  Cr slightly up.  Can continues to endorse shortness of breath. Difficult to assess fluid status due to body habitus.   -Cardiology following.  Planning to transition to p.o. diuretics on 9/5.  -Continue IV Lasix 80 mg daily -GDMT-Farxiga, Coreg, Aldactone and losartan.  Plan to transition to [facility] -Strict I and O, daily weight, renal functions and electrolytes -Encouraged to stop cocaine use.  History of ventricular tachycardia/PPM: PPM approaching end of battery life. -Need generator change by primary EP cardiology at [facility] upon discharge.   Cocaine abuse: UDS positive but he denies use stating that he was around someone who was using cocaine. -Advised to refrain from cocaine.   Chronic respiratory failure  with hypoxia: Stable on home 2.5 L -Continue home oxygen and CPAP -Manage CHF as above   Obstructive sleep apnea -Continue CPAP at night.   Hypertension: Normotensive for most part. -Cardiac meds as above.   IDDM-2 with hyperglycemia and HLD: Seems he is on Lantus, NovoLog and Victoza at home.  A1c 9.1%. Recent Labs  Lab 03/01/22 0655 03/01/22 1123 03/01/22 1649 03/01/22 2123 03/02/22 0632  GLUCAP 199* 179* 141* 192* 173*  -Continue SSI to resistant scale -Continue NovoLog 6 units 3 times daily with meals -Increase Semglee from 15 units to 25 units daily.   We will give additional 10 units tonight since he got 15 units this morning -Continue home Crestor.  Mood disorder: -Continue home meds.  Hypokalemia/hyperphosphatemia: -Monitor replenish as appropriate  Left ear pain: small tissue growth with small pus looking tip over posterior wall of external ear canal on otoscopic exam. -Continue home antibiotic drop  Euthyroid sick syndrome: TSH low but free T4 normal. -Repeat TFT in 4 to 6 weeks.  Morbid obesity Body mass index is 48.49 kg/m. -Continue Victoza on discharge          DVT prophylaxis:  heparin injection 5,000 Units Start: 02/26/22 0630 SCDs Start: 02/26/22 7169  Code Status: Full code Family Communication: None at bedside Level of care: Telemetry Cardiac Status is: Inpatient The patient will remain inpatient because: Acute combined CHF/fluid overload requiring IV Lasix   Final disposition: Likely home once cleared by consultants Consultants:  Cardiology  Sch Meds:  Scheduled Meds:  carvedilol  6.25 mg Oral BID WC   clopidogrel  75 mg Oral Daily   dapagliflozin propanediol  10 mg Oral Daily   DULoxetine  90 mg Oral Daily   heparin  5,000 Units Subcutaneous Q8H   insulin aspart  0-20 Units Subcutaneous TID WC   insulin aspart  0-5 Units Subcutaneous QHS   insulin aspart  6 Units Subcutaneous TID WC   insulin glargine-yfgn  15 Units Subcutaneous Daily   losartan  25 mg Oral Daily   neomycin-polymyxin-hydrocortisone  4 drop Left EAR QID   pantoprazole  40 mg Oral Daily   prazosin  2 mg Oral QHS   rosuvastatin  20 mg Oral Daily   sodium chloride flush  3 mL Intravenous Q12H   spironolactone  25 mg Oral Daily   traZODone  200 mg Oral QHS   Continuous Infusions:  sodium chloride     PRN Meds:.sodium chloride, acetaminophen, hydrOXYzine, ipratropium-albuterol, labetalol, ondansetron (ZOFRAN) IV, sodium chloride flush  Antimicrobials: Anti-infectives (From admission, onward)    None        I have  personally reviewed the following labs and images: CBC: Recent Labs  Lab 02/26/22 0315 02/27/22 0450 02/28/22 0308 03/01/22 0234 03/02/22 0225  WBC 10.6* 9.2 7.6 7.9 8.1  NEUTROABS 7.1  --   --   --   --   HGB 11.7* 12.5* 12.7* 13.5 13.1  HCT 35.7* 38.2* 37.9* 41.6 40.3  MCV 89.5 88.6 87.3 88.9 87.6  PLT 249 261 258 278 293   BMP &GFR Recent Labs  Lab 02/26/22 0315 02/27/22 0450 02/28/22 0308 03/01/22 0234 03/02/22 0225  NA 139 139 141 138 139  K 3.7 3.2* 4.2 3.9 3.8  CL 106 99 103 101 102  CO2 25 28 27 28 27   GLUCOSE 166* 140* 250* 187* 190*  BUN 9 13 18 17  23*  CREATININE 0.87 1.01 1.05 0.98 1.21  CALCIUM 8.6* 8.9 9.2 9.2 9.0  MG  --  2.1 2.3 2.3 2.2  PHOS  --  5.2* 4.9* 5.2* 5.4*   Estimated Creatinine Clearance: 91.2 mL/min (by C-G formula based on SCr of 1.21 mg/dL). Liver & Pancreas: Recent Labs  Lab 02/27/22 0450 02/28/22 0308 03/01/22 0234 03/02/22 0225  AST 22  --   --   --   ALT 20  --   --   --   ALKPHOS 45  --   --   --   BILITOT 0.5  --   --   --   PROT 7.0  --   --   --   ALBUMIN 3.4* 3.4* 3.5 3.5   No results for input(s): "LIPASE", "AMYLASE" in the last 168 hours. No results for input(s): "AMMONIA" in the last 168 hours. Diabetic: No results for input(s): "HGBA1C" in the last 72 hours.  Recent Labs  Lab 03/01/22 0655 03/01/22 1123 03/01/22 1649 03/01/22 2123 03/02/22 0632  GLUCAP 199* 179* 141* 192* 173*   Cardiac Enzymes: No results for input(s): "CKTOTAL", "CKMB", "CKMBINDEX", "TROPONINI" in the last 168 hours. No results for input(s): "PROBNP" in the last 8760 hours. Coagulation Profile: No results for input(s): "INR", "PROTIME" in the last 168 hours. Thyroid Function Tests: Recent Labs    03/01/22 0918  TSH 0.117*  FREET4 1.11   Lipid Profile: No results for input(s): "CHOL", "HDL", "LDLCALC", "TRIG", "CHOLHDL", "LDLDIRECT" in the last 72 hours. Anemia Panel: No results for input(s): "VITAMINB12", "FOLATE", "FERRITIN",  "TIBC", "IRON", "RETICCTPCT" in the last 72 hours. Urine analysis:    Component Value Date/Time   COLORURINE YELLOW 08/05/2021 1429   APPEARANCEUR CLEAR 08/05/2021 1429   LABSPEC 1.030 08/05/2021 1429   PHURINE 5.0 08/05/2021 1429   GLUCOSEU >=500 (A) 08/05/2021 1429   HGBUR SMALL (A) 08/05/2021 1429   BILIRUBINUR NEGATIVE 08/05/2021 1429   KETONESUR NEGATIVE 08/05/2021 1429   PROTEINUR NEGATIVE 08/05/2021 1429   NITRITE NEGATIVE 08/05/2021 1429   LEUKOCYTESUR NEGATIVE 08/05/2021 1429   Sepsis Labs: Invalid input(s): "PROCALCITONIN", "LACTICIDVEN"  Microbiology: No results found for this or any previous visit (from the past 240 hour(s)).  Radiology Studies: No results found.    Saman Giddens T. Devaunte Gasparini [facility]  If 7PM-7AM, please contact night-coverage [email or web address] 03/02/2022, 11:03 AM

## 2022-03-02 NOTE — Progress Notes (Signed)
Physical Therapy Treatment Patient Details Name: Luis [name] MRN: [id number] DOB: [birth year] Today's Date: 03/02/2022   History of Present Illness Pt is a 51 y/o male admitted secondary to SOB. Thought to be secondary to CHF. PMH includes pacemaker, DM, tobacco use, HTN and substance abuse.    PT Comments    Pt received in bed on RA with SPO2 95%. Pt first ambulated on RA but had to sit after 60' and SPO2 dropped to mid 80's and pt with 3/4 DOE. On 3 L O2 pt ambulated another 3' followed by seated rest and then another 30' at which point he felt lightheaded and pushed self back to room in seated position on rollator. HR 105 bpm with ambulation, SPO2 remained in 90's on 3L O2. Pt continues to relay numbness R foot which is affecting gait pattern and requiring him to exert more energy with ambulation. Recommend rollator for home use as well as HHPT and HHaide if possible. PT will continue to follow.     Recommendations for follow up therapy are one component of a multi-disciplinary discharge planning process, led by the attending physician.  Recommendations may be updated based on patient status, additional functional criteria and insurance authorization.  Follow Up Recommendations  Home health PT     Assistance Recommended at Discharge Intermittent Supervision/Assistance  Patient can return home with the following Assistance with cooking/housework   Equipment Recommendations  Other (comment) (rollator)    Recommendations for Other Services       Precautions / Restrictions Precautions Precautions: Fall Precaution Comments: monitor O2 Restrictions Weight Bearing Restrictions: No     Mobility  Bed Mobility Overal bed mobility: Modified Independent Bed Mobility: Supine to Sit     Supine to sit: Modified independent (Device/Increase time)     General bed mobility comments: increases time needed    Transfers Overall transfer level: Needs assistance Equipment used:  None, Rollator (4 wheels) Transfers: Sit to/from Stand Sit to Stand: Supervision           General transfer comment: reaches for stable surfaces due to R foot numbness    Ambulation/Gait Ambulation/Gait assistance: Min assist Gait Distance (Feet): 150 Feet (60', 60', 30') Assistive device: Rollator (4 wheels) Gait Pattern/deviations: Step-through pattern, Decreased stance time - right, Decreased weight shift to right Gait velocity: decreased Gait velocity interpretation: <1.31 ft/sec, indicative of household ambulator   General Gait Details: pt becomes very SOB with ambulation. First 39' on RA with SPO2 dropping to min 80's. Pt then placed on 3L O2 with sats remaining in mind 90's but pt still with 3/4 DOE requiring seated rest breaks. After last 30' pt c/o dizziness so rolled last 30' to room sitting on rollator. HR up to 105 bpm with ambulation   Stairs             Wheelchair Mobility    Modified Rankin (Stroke Patients Only)       Balance Overall balance assessment: Needs assistance Sitting-balance support: No upper extremity supported, Feet supported Sitting balance-Leahy Scale: Good     Standing balance support: No upper extremity supported Standing balance-Leahy Scale: Fair Standing balance comment: new balance deficitis due to decreased sensation R foot. Pt able to maintain static stance but UE support needed for safety with dynamic activity                            Cognition Arousal/Alertness: Awake/alert Behavior During Therapy: [facility] for tasks assessed/performed  Overall Cognitive Status: No family/caregiver present to determine baseline cognitive functioning                                          Exercises      General Comments        Pertinent Vitals/Pain Pain Assessment Pain Assessment: No/denies pain    Home Living                          Prior Function            PT Goals (current goals  can now be found in the care plan section) Acute Rehab PT Goals Patient Stated Goal: to figure out why his R toes are numb PT Goal Formulation: With patient Time For Goal Achievement: 03/13/22 Potential to Achieve Goals: Fair Progress towards PT goals: Progressing toward goals    Frequency    Min 3X/week      PT Plan Equipment recommendations need to be updated    Co-evaluation              AM-PAC PT "6 Clicks" Mobility   Outcome Measure  Help needed turning from your back to your side while in a flat bed without using bedrails?: None Help needed moving from lying on your back to sitting on the side of a flat bed without using bedrails?: None Help needed moving to and from a bed to a chair (including a wheelchair)?: A Little Help needed standing up from a chair using your arms (e.g., wheelchair or bedside chair)?: A Little Help needed to walk in hospital room?: A Little Help needed climbing 3-5 steps with a railing? : A Lot 6 Click Score: 19    End of Session Equipment Utilized During Treatment: Gait belt;Oxygen Activity Tolerance: Treatment limited secondary to medical complications (Comment) (lightheadedness) Patient left: with call bell/phone within reach;in chair Nurse Communication: Mobility status PT Visit Diagnosis: Unsteadiness on feet (R26.81);Muscle weakness (generalized) (M62.81);Difficulty in walking, not elsewhere classified (R26.2)     Time: 9678-9381 PT Time Calculation (min) (ACUTE ONLY): 31 min  Charges:  $Gait Training: 23-37 mins                     [name], PT  Acute Rehab Services Secure chat preferred Office [phone]    Lawana Chambers Pj Zehner 03/02/2022, 10:48 AM

## 2022-03-02 NOTE — Plan of Care (Signed)
  Problem: Coping: Goal: Ability to adjust to condition or change in health will improve Outcome: Progressing   Problem: Fluid Volume: Goal: Ability to maintain a balanced intake and output will improve Outcome: Progressing   Problem: Health Behavior/Discharge Planning: Goal: Ability to manage health-related needs will improve Outcome: Progressing   

## 2022-03-03 DIAGNOSIS — R531 Weakness: Secondary | ICD-10-CM

## 2022-03-03 DIAGNOSIS — E119 Type 2 diabetes mellitus without complications: Secondary | ICD-10-CM | POA: Diagnosis not present

## 2022-03-03 DIAGNOSIS — F1410 Cocaine abuse, uncomplicated: Secondary | ICD-10-CM | POA: Diagnosis not present

## 2022-03-03 DIAGNOSIS — J9611 Chronic respiratory failure with hypoxia: Secondary | ICD-10-CM | POA: Diagnosis not present

## 2022-03-03 DIAGNOSIS — I5043 Acute on chronic combined systolic (congestive) and diastolic (congestive) heart failure: Secondary | ICD-10-CM | POA: Diagnosis not present

## 2022-03-03 LAB — CBC
HCT: 40.9 % (ref 39.0–52.0)
Hemoglobin: 13.8 g/dL (ref 13.0–17.0)
MCH: 29.4 pg (ref 26.0–34.0)
MCHC: 33.7 g/dL (ref 30.0–36.0)
MCV: 87 fL (ref 80.0–100.0)
Platelets: 268 10*3/uL (ref 150–400)
RBC: 4.7 MIL/uL (ref 4.22–5.81)
RDW: 12.9 % (ref 11.5–15.5)
WBC: 9 10*3/uL (ref 4.0–10.5)
nRBC: 0 % (ref 0.0–0.2)

## 2022-03-03 LAB — RENAL FUNCTION PANEL
Albumin: 3.7 g/dL (ref 3.5–5.0)
Anion gap: 9 (ref 5–15)
BUN: 25 mg/dL — ABNORMAL HIGH (ref 6–20)
CO2: 26 mmol/L (ref 22–32)
Calcium: 9 mg/dL (ref 8.9–10.3)
Chloride: 101 mmol/L (ref 98–111)
Creatinine, Ser: 1.08 mg/dL (ref 0.61–1.24)
GFR, Estimated: >60 mL/min (ref >60)
Glucose, Bld: 196 mg/dL — ABNORMAL HIGH (ref 70–99)
Phosphorus: 5.3 mg/dL — ABNORMAL HIGH (ref 2.5–4.6)
Potassium: 3.8 mmol/L (ref 3.5–5.1)
Sodium: 136 mmol/L (ref 135–145)

## 2022-03-03 LAB — GLUCOSE, CAPILLARY
Glucose-Capillary: 185 mg/dL — ABNORMAL HIGH (ref 70–99)
Glucose-Capillary: 179 mg/dL — ABNORMAL HIGH (ref 70–99)
Glucose-Capillary: 136 mg/dL — ABNORMAL HIGH (ref 70–99)
Glucose-Capillary: 156 mg/dL — ABNORMAL HIGH (ref 70–99)

## 2022-03-03 LAB — T3: T3, Total: 130 ng/dL (ref 71–180)

## 2022-03-03 LAB — BRAIN NATRIURETIC PEPTIDE: B Natriuretic Peptide: 62.2 pg/mL (ref 0.0–100.0)

## 2022-03-03 LAB — PROCALCITONIN: Procalcitonin: <0.1 ng/mL

## 2022-03-03 LAB — MAGNESIUM: Magnesium: 2.4 mg/dL (ref 1.7–2.4)

## 2022-03-03 MED ORDER — FUROSEMIDE 10 MG/ML IJ SOLN
80.0000 mg | Freq: Two times a day (BID) | INTRAMUSCULAR | Status: DC
  Administered 2022-03-03 – 2022-03-05 (×4): 80 mg via INTRAVENOUS
  Filled 2022-03-03 (×4): qty 8

## 2022-03-03 MED ORDER — POTASSIUM CHLORIDE CRYS ER 20 MEQ PO TBCR
40.0000 meq | EXTENDED_RELEASE_TABLET | Freq: Once | ORAL | Status: AC
Start: 2022-03-03 — End: 2022-03-03
  Administered 2022-03-03: 40 meq via ORAL
  Filled 2022-03-03: qty 2

## 2022-03-03 MED ORDER — INSULIN ASPART 100 UNIT/ML IJ SOLN
8.0000 [IU] | Freq: Three times a day (TID) | INTRAMUSCULAR | Status: DC
  Administered 2022-03-03 – 2022-03-08 (×14): 8 [IU] via SUBCUTANEOUS

## 2022-03-03 MED ORDER — SACUBITRIL-VALSARTAN 24-26 MG PO TABS
1.0000 | ORAL_TABLET | Freq: Two times a day (BID) | ORAL | Status: DC
  Administered 2022-03-04 – 2022-03-08 (×9): 1 via ORAL
  Filled 2022-03-03 (×9): qty 1

## 2022-03-03 NOTE — Progress Notes (Signed)
Pt resting on CPAP comfortably with stable vital signs.

## 2022-03-03 NOTE — Progress Notes (Signed)
Heart Failure Stewardship Pharmacist Progress Note   PCP: Margot Ables, MD (Inactive) PCP-Cardiologist: Bryan Lemma, MD    HPI:  51 yo M with PMH of HTN, T2DM, HLD, HFrEF since ~2000, polysubstance use, OSA on CPAP, and PPM.   He presented to the ED on 8/31 with chest pain, shortness of breath, and LE edema. He reports he took 320 mg of lasix without any improvement. CXR with mild vascular congestion without pulmonary edema. He states he got out of drug and alcohol rehab 3 days PTA, but UDS was positive for cocaine on admission. ECHO on 9/1 showed LVEF 25%, RV mildly reduced.   Current HF Medications: Diuretic: furosemide 80 mg IV BID Beta Blocker: carvedilol 6.25 mg BID ACE/ARB/ARNI: losartan 25 mg daily MRA: spironolactone 25 mg daily SGLT2i: Farxiga 10 mg daily  Prior to admission HF Medications: Diuretic: furosemide 80 mg daily Beta blocker: carvedilol 6.25 mg BID *metoprolol XL also on list but not reporting as taking  Pertinent Lab Values: Serum creatinine 1.08, BUN 25, Potassium 3.8, Sodium 136, BNP 136.5, Magnesium 2.4, A1c 9.1  Vital Signs: Weight: 292 lbs (admission weight: 300 lbs) Blood pressure: 140/70s  Heart rate: 80-90s (v-paced)  I/O: -4L yesterday; net -12L  Medication Assistance / Insurance Benefits Check: Does the patient have prescription insurance?  Yes Type of insurance plan: [place] Medicaid  Outpatient Pharmacy:  Prior to admission outpatient pharmacy: [facility] Is the patient willing to use [facility] pharmacy at discharge? Yes Is the patient willing to transition their outpatient pharmacy to utilize a [facility] outpatient pharmacy?   Pending    Assessment: 1. Acute on chronic systolic CHF (LVEF 25%), due to NICM. NYHA class III symptoms. - Agree with resuming furosemide 80 mg IV BID with edema on exam. Strict I/Os. Daily weights. K low today - keep K>4 and Mag>2.   - Continue carvedilol 6.25 mg BID - On losartan 25 mg daily.  Consider transitioning to Entresto 24/26 mg BID for GDMT optimization. Reports cough with lisinopril. Can challenge here in a controlled setting to see if he will have this reaction with Entresto prior to discharge.  - Continue spironolactone 25 mg daily - Continue Farxiga 10 mg daily   Plan: 1) Medication changes recommended at this time: - Stop losartan - Start Entresto 24/26 mg BID (starting tomorrow) - KCl 40 mEq x 1 - Remove metoprolol XL from med list on discharge  2) Patient assistance: - Has [place] Medicaid - Entresto copay $0 - Farxiga copay $0  3)  Education  - To be completed prior to discharge  Sharen Hones, PharmD, BCPS Heart Failure Engineer, building services Phone [phone]

## 2022-03-03 NOTE — Progress Notes (Signed)
PROGRESS NOTE  Luis [name] [id number] DOB: 02/02/1971   PCP: Buzzy Han, MD (Inactive)  Patient is from: Home.  DOA: 02/26/2022 LOS: 4  Chief complaints Chief Complaint  Patient presents with   Shortness of Breath   Chest Pain     Brief Narrative / Interim history: 51 year old M with PMH of combined CHF, PPM, chronic hypoxic RF on 2.5 L, OSA on CPAP, morbid obesity and ongoing cocaine use disorder presenting with progressive shortness of breath for about a week despite increasing his home Lasix, and admitted for acute on chronic combined CHF.  BNP 136.  Chest x-ray consistent with CHF.  Urine is positive for cocaine.  TTE ordered.  Patient was started on IV Lasix.  TTE with LVEF of 25% (30 to 35% in 2022)., GH but not able to assess RWMA, diastolic function.  Cardiology following.  Remains on IV Lasix.  Subjective: Seen and examined earlier this morning.  No major events overnight of this morning.  Reports improvement in his breathing but not quite back to baseline.  Also reports cough.  Feels "rattly".  No chest pain, GI or UTI symptoms.  About 3.9 L UOP/24 hours.  Objective: Vitals:   03/02/22 1921 03/02/22 2128 03/03/22 0516 03/03/22 0622  BP: (!) 120/59 132/67 138/68   Pulse: 93  86   Resp: 20  18   Temp: 97.9 F (36.6 C)  97.9 F (36.6 C)   TempSrc:   Oral   SpO2: 94%  95%   Weight:    132.5 kg  Height:        Examination:  GENERAL: No apparent distress.  Nontoxic. HEENT: MMM.  Vision and hearing grossly intact.  NECK: Supple.  Difficult to assess JVD due to body habitus. RESP:  No IWOB.  Fair aeration bilaterally but limited exam due to body habitus. CVS:  RRR. Heart sounds normal.  ABD/GI/GU: BS+. Abd soft, NTND.  MSK/EXT:  Moves extremities. No apparent deformity. No edema.  SKIN: no apparent skin lesion or wound NEURO: Awake and alert. Oriented appropriately.  No apparent focal neuro deficit. PSYCH: Calm. Normal affect.    Procedures:  None  Microbiology summarized: None  Assessment and plan: Principal Problem:   Acute on chronic combined systolic and diastolic CHF (congestive heart failure) (HCC) Active Problems:   Cocaine abuse (HCC)   Pacemaker   HLD (hyperlipidemia)   Diabetes mellitus without complication (HCC)   Hypertension   Tobacco abuse   Morbid obesity (HCC)   Sleep apnea   Chronic respiratory failure with hypoxia (HCC) - 2.5 L/min  Acute on chronic combined CHF: TTE with LVEF of 25%.  (LVEF of 30 to 35%, GH, G2-DD in 8/22).  Presents with progressive dyspnea.  BNP 136>> 87.  CXR consistent with CHF.  Pro-Cal negative arguing against bacterial infection.  UDS positive for cocaine.  Followed by [facility] cardiology.  Started on IV Lasix.  Net -11.6 L.  Cr stable.  Can continues to endorse shortness of breath. Difficult to assess fluid status due to body habitus.   -Cardiology following-resumed IV Lasix at 80 mg twice daily -GDMT-Farxiga, Coreg, Aldactone and losartan.  -Strict I and O, daily weight, renal functions and electrolytes -Encouraged to stop cocaine use.  History of ventricular tachycardia/PPM: PPM approaching end of battery life. -Need generator change by primary EP cardiology at [facility] upon discharge.   Cocaine abuse: UDS positive but he denies use stating that he was around someone who was using cocaine. -Advised to refrain from  cocaine.   Chronic respiratory failure with hypoxia: Stable on home 2.5 L -Continue home oxygen and CPAP -Manage CHF as above   Obstructive sleep apnea -Continue CPAP at night.   Hypertension: Normotensive for most part. -Cardiac meds as above.   IDDM-2 with hyperglycemia and HLD: Seems he is on Lantus, NovoLog and Victoza at home.  A1c 9.1%. Recent Labs  Lab 03/02/22 0632 03/02/22 1150 03/02/22 1607 03/02/22 2104 03/03/22 0616  GLUCAP 173* 156* 132* 245* 185*  -Continue SSI to resistant scale -Increase NovoLog from 6 to 8 units 3  times daily with meals -Continue Semglee 25 units daily -Continue home Crestor.  Mood disorder: Stable. -Continue home meds.  Hypokalemia/hyperphosphatemia: Hypokalemia resolved.  P5.3. -Monitor replenish as appropriate  Left ear pain: small tissue growth with small pus looking tip over posterior wall of external ear canal on otoscopic exam.  Improved. -Continue home antibiotic drop  Euthyroid sick syndrome: TSH low but free T4 normal. -Repeat TFT in 4 to 6 weeks.  Generalized weakness/physical deconditioning -HH PT/aide/rollator ordered.  Morbid obesity Body mass index is 47.87 kg/m. -Continue Victoza on discharge          DVT prophylaxis:  heparin injection 5,000 Units Start: 02/26/22 0630 SCDs Start: 02/26/22 6659  Code Status: Full code Family Communication: None at bedside Level of care: Telemetry Cardiac Status is: Inpatient The patient will remain inpatient because: Acute combined CHF/fluid overload requiring IV Lasix   Final disposition: Likely home once cleared Consultants:  Cardiology  Sch Meds:  Scheduled Meds:  carvedilol  6.25 mg Oral BID WC   clopidogrel  75 mg Oral Daily   dapagliflozin propanediol  10 mg Oral Daily   DULoxetine  90 mg Oral Daily   furosemide  80 mg Intravenous BID   heparin  5,000 Units Subcutaneous Q8H   insulin aspart  0-20 Units Subcutaneous TID WC   insulin aspart  0-5 Units Subcutaneous QHS   insulin aspart  6 Units Subcutaneous TID WC   insulin glargine-yfgn  25 Units Subcutaneous Daily   losartan  25 mg Oral Daily   neomycin-polymyxin-hydrocortisone  4 drop Left EAR QID   pantoprazole  40 mg Oral Daily   prazosin  2 mg Oral QHS   rosuvastatin  20 mg Oral Daily   sodium chloride flush  3 mL Intravenous Q12H   spironolactone  25 mg Oral Daily   traZODone  200 mg Oral QHS   Continuous Infusions:  sodium chloride     PRN Meds:.sodium chloride, acetaminophen, hydrOXYzine, ipratropium-albuterol, labetalol,  ondansetron (ZOFRAN) IV, sodium chloride flush  Antimicrobials: Anti-infectives (From admission, onward)    None        I have personally reviewed the following labs and images: CBC: Recent Labs  Lab 02/26/22 0315 02/27/22 0450 02/28/22 0308 03/01/22 0234 03/02/22 0225 03/03/22 0228  WBC 10.6* 9.2 7.6 7.9 8.1 9.0  NEUTROABS 7.1  --   --   --   --   --   HGB 11.7* 12.5* 12.7* 13.5 13.1 13.8  HCT 35.7* 38.2* 37.9* 41.6 40.3 40.9  MCV 89.5 88.6 87.3 88.9 87.6 87.0  PLT 249 261 258 278 293 268   BMP &GFR Recent Labs  Lab 02/27/22 0450 02/28/22 0308 03/01/22 0234 03/02/22 0225 03/03/22 0228  NA 139 141 138 139 136  K 3.2* 4.2 3.9 3.8 3.8  CL 99 103 101 102 101  CO2 28 27 28 27 26   GLUCOSE 140* 250* 187* 190* 196*  BUN 13 18  17 23* 25*  CREATININE 1.01 1.05 0.98 1.21 1.08  CALCIUM 8.9 9.2 9.2 9.0 9.0  MG 2.1 2.3 2.3 2.2 2.4  PHOS 5.2* 4.9* 5.2* 5.4* 5.3*   Estimated Creatinine Clearance: 101.4 mL/min (by C-G formula based on SCr of 1.08 mg/dL). Liver & Pancreas: Recent Labs  Lab 02/27/22 0450 02/28/22 0308 03/01/22 0234 03/02/22 0225 03/03/22 0228  AST 22  --   --   --   --   ALT 20  --   --   --   --   ALKPHOS 45  --   --   --   --   BILITOT 0.5  --   --   --   --   PROT 7.0  --   --   --   --   ALBUMIN 3.4* 3.4* 3.5 3.5 3.7   No results for input(s): "LIPASE", "AMYLASE" in the last 168 hours. No results for input(s): "AMMONIA" in the last 168 hours. Diabetic: No results for input(s): "HGBA1C" in the last 72 hours.  Recent Labs  Lab 03/02/22 0632 03/02/22 1150 03/02/22 1607 03/02/22 2104 03/03/22 0616  GLUCAP 173* 156* 132* 245* 185*   Cardiac Enzymes: No results for input(s): "CKTOTAL", "CKMB", "CKMBINDEX", "TROPONINI" in the last 168 hours. No results for input(s): "PROBNP" in the last 8760 hours. Coagulation Profile: No results for input(s): "INR", "PROTIME" in the last 168 hours. Thyroid Function Tests: Recent Labs    03/01/22 0918   TSH 0.117*  FREET4 1.11   Lipid Profile: No results for input(s): "CHOL", "HDL", "LDLCALC", "TRIG", "CHOLHDL", "LDLDIRECT" in the last 72 hours. Anemia Panel: No results for input(s): "VITAMINB12", "FOLATE", "FERRITIN", "TIBC", "IRON", "RETICCTPCT" in the last 72 hours. Urine analysis:    Component Value Date/Time   COLORURINE YELLOW 08/05/2021 1429   APPEARANCEUR CLEAR 08/05/2021 1429   LABSPEC 1.030 08/05/2021 1429   PHURINE 5.0 08/05/2021 1429   GLUCOSEU >=500 (A) 08/05/2021 1429   HGBUR SMALL (A) 08/05/2021 1429   BILIRUBINUR NEGATIVE 08/05/2021 1429   KETONESUR NEGATIVE 08/05/2021 1429   PROTEINUR NEGATIVE 08/05/2021 1429   NITRITE NEGATIVE 08/05/2021 1429   LEUKOCYTESUR NEGATIVE 08/05/2021 1429   Sepsis Labs: Invalid input(s): "PROCALCITONIN", "LACTICIDVEN"  Microbiology: No results found for this or any previous visit (from the past 240 hour(s)).  Radiology Studies: No results found.    Shameeka Silliman T. Torin Whisner [facility]  If 7PM-7AM, please contact night-coverage [email or web address] 03/03/2022, 11:02 AM

## 2022-03-03 NOTE — Plan of Care (Signed)
  Problem: Fluid Volume: Goal: Ability to maintain a balanced intake and output will improve Outcome: Progressing   

## 2022-03-03 NOTE — Care Management Important Message (Signed)
Important Message  Patient Details  Name: Luis [name] MRN: [id number] Date of Birth: Nov 14, 1970   Medicare Important Message Given:  Yes     Renie Ora 03/03/2022, 8:16 AM

## 2022-03-03 NOTE — Progress Notes (Signed)
Physical Therapy Treatment Patient Details Name: Luis [name] MRN: [id number] DOB: 11-Mar-1971 Today's Date: 03/03/2022   History of Present Illness Pt is a 51 y/o male admitted secondary to SOB. Thought to be secondary to CHF. PMH includes pacemaker, DM, tobacco use, HTN and substance abuse.    PT Comments    Pt not feeling well today, reports that he was coughing last night in the bed and couldn't sleep so he attempted to get up to the chair and fell between bed and chair when he became dizzy and sat back down missing the bed. He continues to c/o chest pain with exertion, 5/10, RN aware. Pt ambulated 50' x6 with rollator. Worked on short bursts of ambulation with frequent rests to prevent increasing dizziness. Pt had 1 episode of R knee buckling and was able to catch self on rollator with min A. Continues to relay decreased sensation R foot to knee. PT will continue to follow.    Recommendations for follow up therapy are one component of a multi-disciplinary discharge planning process, led by the attending physician.  Recommendations may be updated based on patient status, additional functional criteria and insurance authorization.  Follow Up Recommendations  Home health PT     Assistance Recommended at Discharge Intermittent Supervision/Assistance  Patient can return home with the following Assistance with cooking/housework   Equipment Recommendations  Other (comment) (rollator)    Recommendations for Other Services       Precautions / Restrictions Precautions Precautions: Fall Precaution Comments: monitor O2 Restrictions Weight Bearing Restrictions: No     Mobility  Bed Mobility               General bed mobility comments: pt received in recliner    Transfers Overall transfer level: Needs assistance Equipment used: Rollator (4 wheels) Transfers: Sit to/from Stand Sit to Stand: Supervision, Min assist           General transfer comment: close  supervision given due to pt's fall and min A given each time he turned around to sit on rollator. Pt able to rise from rollator with supervision. Performed 5x    Ambulation/Gait Ambulation/Gait assistance: Min guard Gait Distance (Feet): 300 Feet (roughly 50' x6) Assistive device: Rollator (4 wheels) Gait Pattern/deviations: Step-through pattern, Decreased stance time - right, Decreased weight shift to right Gait velocity: decreased Gait velocity interpretation: <1.31 ft/sec, indicative of household ambulator   General Gait Details: cued pt today to work on self monitoring but he is not great at this so needed cues to sit every 50-75' before he got excessively dizzy and SOB. Performed 6 bouts. 1 episode of R knee buckling but held self up with rollator. Ambulated on 3L O2 with sats remaining mid 90's, HR low 100s. Pt c/o chest pain after first bout of ambulation, RN notified   Stairs             Wheelchair Mobility    Modified Rankin (Stroke Patients Only)       Balance Overall balance assessment: Needs assistance Sitting-balance support: No upper extremity supported, Feet supported Sitting balance-Leahy Scale: Good     Standing balance support: No upper extremity supported Standing balance-Leahy Scale: Fair Standing balance comment: lack of sensation R foot to knee continues to decrease balance as well as intermittent dizziness                            Cognition Arousal/Alertness: Awake/alert Behavior During Therapy: [facility] for  tasks assessed/performed, Anxious Overall Cognitive Status: Within Functional Limits for tasks assessed                                          Exercises General Exercises - Lower Extremity Long Arc Quad: AROM, Right, 10 reps, Seated, Limitations (with manual resistance) Long Arc Quad Limitations: pt cannot maintain sustained quad contraction, has intermittent bursts of "letting go" Hip Flexion/Marching: AROM,  Right, 5 reps, Seated (with manual resistance)    General Comments General comments (skin integrity, edema, etc.): RN present and aware of pt's symptoms      Pertinent Vitals/Pain Pain Assessment Pain Assessment: 0-10 Pain Score: 5  Pain Location: chest pain and HA Pain Descriptors / Indicators: Sharp Pain Intervention(s): Limited activity within patient's tolerance, Monitored during session, Patient requesting pain meds-RN notified    Home Living                          Prior Function            PT Goals (current goals can now be found in the care plan section) Acute Rehab PT Goals Patient Stated Goal: to figure out why his R toes are numb PT Goal Formulation: With patient Time For Goal Achievement: 03/13/22 Potential to Achieve Goals: Fair Progress towards PT goals: Progressing toward goals    Frequency    Min 3X/week      PT Plan Equipment recommendations need to be updated    [name]-evaluation              AM-PAC PT "6 Clicks" Mobility   Outcome Measure  Help needed turning from your back to your side while in a flat bed without using bedrails?: None Help needed moving from lying on your back to sitting on the side of a flat bed without using bedrails?: None Help needed moving to and from a bed to a chair (including a wheelchair)?: A Little Help needed standing up from a chair using your arms (e.g., wheelchair or bedside chair)?: A Little Help needed to walk in hospital room?: A Little Help needed climbing 3-5 steps with a railing? : A Lot 6 Click Score: 19    End of Session Equipment Utilized During Treatment: Oxygen Activity Tolerance: Treatment limited secondary to medical complications (Comment) (lightheadedness) Patient left: with call bell/phone within reach;in chair Nurse Communication: Mobility status PT Visit Diagnosis: Unsteadiness on feet (R26.81);Muscle weakness (generalized) (M62.81);Difficulty in walking, not elsewhere classified  (R26.2)     Time: 8756-4332 PT Time Calculation (min) (ACUTE ONLY): 42 min  Charges:  $Gait Training: 23-37 mins $Therapeutic Exercise: 8-22 mins                     Luis [name], PT  Acute Rehab Services Secure chat preferred Office [phone]    Luis [name] 03/03/2022, 11:55 AM

## 2022-03-04 DIAGNOSIS — I5043 Acute on chronic combined systolic (congestive) and diastolic (congestive) heart failure: Secondary | ICD-10-CM | POA: Diagnosis not present

## 2022-03-04 DIAGNOSIS — J9611 Chronic respiratory failure with hypoxia: Secondary | ICD-10-CM | POA: Diagnosis not present

## 2022-03-04 DIAGNOSIS — R059 Cough, unspecified: Secondary | ICD-10-CM

## 2022-03-04 DIAGNOSIS — E119 Type 2 diabetes mellitus without complications: Secondary | ICD-10-CM | POA: Diagnosis not present

## 2022-03-04 DIAGNOSIS — F1410 Cocaine abuse, uncomplicated: Secondary | ICD-10-CM | POA: Diagnosis not present

## 2022-03-04 LAB — BASIC METABOLIC PANEL
Anion gap: 10 (ref 5–15)
BUN: 22 mg/dL — ABNORMAL HIGH (ref 6–20)
CO2: 24 mmol/L (ref 22–32)
Calcium: 9.1 mg/dL (ref 8.9–10.3)
Chloride: 100 mmol/L (ref 98–111)
Creatinine, Ser: 1.14 mg/dL (ref 0.61–1.24)
GFR, Estimated: >60 mL/min (ref >60)
Glucose, Bld: 248 mg/dL — ABNORMAL HIGH (ref 70–99)
Potassium: 4.5 mmol/L (ref 3.5–5.1)
Sodium: 134 mmol/L — ABNORMAL LOW (ref 135–145)

## 2022-03-04 LAB — GLUCOSE, CAPILLARY
Glucose-Capillary: 171 mg/dL — ABNORMAL HIGH (ref 70–99)
Glucose-Capillary: 145 mg/dL — ABNORMAL HIGH (ref 70–99)
Glucose-Capillary: 145 mg/dL — ABNORMAL HIGH (ref 70–99)
Glucose-Capillary: 189 mg/dL — ABNORMAL HIGH (ref 70–99)

## 2022-03-04 LAB — MAGNESIUM: Magnesium: 2.3 mg/dL (ref 1.7–2.4)

## 2022-03-04 MED ORDER — CARVEDILOL 12.5 MG PO TABS
12.5000 mg | ORAL_TABLET | Freq: Two times a day (BID) | ORAL | Status: DC
  Administered 2022-03-04 – 2022-03-08 (×7): 12.5 mg via ORAL
  Filled 2022-03-04 (×7): qty 1

## 2022-03-04 NOTE — Progress Notes (Signed)
   03/04/22 1351  Mobility  Activity Ambulated with assistance in hallway  Level of Assistance Standby assist, set-up cues, supervision of patient - no hands on  Assistive Device Front wheel walker  Distance Ambulated (ft) 70 ft  Activity Response Tolerated fair  $Mobility charge 1 Mobility   Mobility Specialist Progress Note  Pt was in chair and agreeable. X2 seated breaks d/t fatigue. Returned to chair w/ all needs met and call bell in reach.   Luis [name] Mobility Specialist

## 2022-03-04 NOTE — Progress Notes (Signed)
Occupational Therapy Treatment Patient Details Name: Luis [name] MRN: [id number] DOB: 1970-11-19 Today's Date: 03/04/2022   History of present illness Pt is a 51 y/o male admitted secondary to SOB. Thought to be secondary to CHF. PMH includes pacemaker, DM, tobacco use, HTN and substance abuse.   OT comments  Patient up in recliner upon arrival asking to perform mobility with rollator. Patient performed mobility down hall with rollator and 2 liters of oxygent. After a few short bouts down hallway with rest breaks patient began to have complaints of chest pain and dizziness. Patient returned to room and nursing notified. Vital signs WNL. Discharge recommendations continue to be appropriate.    Recommendations for follow up therapy are one component of a multi-disciplinary discharge planning process, led by the attending physician.  Recommendations may be updated based on patient status, additional functional criteria and insurance authorization.    Follow Up Recommendations  No OT follow up (pt requesting HH aide assistance at home for ADLs)    Assistance Recommended at Discharge Intermittent Supervision/Assistance  Patient can return home with the following  A little help with bathing/dressing/bathroom;Assistance with cooking/housework   Equipment Recommendations  None recommended by OT    Recommendations for Other Services      Precautions / Restrictions Precautions Precautions: Fall Precaution Comments: monitor O2 Restrictions Weight Bearing Restrictions: No       Mobility Bed Mobility Overal bed mobility: Modified Independent             General bed mobility comments: pt received in recliner    Transfers Overall transfer level: Needs assistance Equipment used: Rollator (4 wheels) Transfers: Sit to/from Stand Sit to Stand: Min guard           General transfer comment: close supervision initially but began having chest pains and required min guard for  safety     Balance Overall balance assessment: Needs assistance Sitting-balance support: No upper extremity supported, Feet supported Sitting balance-Leahy Scale: Good     Standing balance support: No upper extremity supported, Bilateral upper extremity supported Standing balance-Leahy Scale: Fair Standing balance comment: required UE support when began having complaints of dizziness                           ADL either performed or assessed with clinical judgement   ADL Overall ADL's : Needs assistance/impaired                         Toilet Transfer: Supervision/safety;Ambulation;Rollator (4 wheels) Toilet Transfer Details (indicate cue type and reason): stood to void in bathroom           General ADL Comments: focused on mobiltiy and activity tolerance    Extremity/Trunk Assessment              Vision       Perception     Praxis      Cognition Arousal/Alertness: Awake/alert Behavior During Therapy: WFL for tasks assessed/performed, Anxious Overall Cognitive Status: Within Functional Limits for tasks assessed                                          Exercises      Shoulder Instructions       General Comments During mobility patient began having complaints of chest pain and dizziness.  Vital signs WNLs.  Nursing notified    Pertinent Vitals/ Pain       Pain Assessment Pain Assessment: Faces Faces Pain Scale: Hurts little more Pain Location: chest pain and HA Pain Descriptors / Indicators: Pressure, Sharp Pain Intervention(s): Limited activity within patient's tolerance, Monitored during session, Patient requesting pain meds-RN notified  Home Living                                          Prior Functioning/Environment              Frequency  Min 2X/week        Progress Toward Goals  OT Goals(current goals can now be found in the care plan section)  Progress towards OT goals:  Progressing toward goals  Acute Rehab OT Goals Patient Stated Goal: go home OT Goal Formulation: With patient Time For Goal Achievement: 03/15/22 Potential to Achieve Goals: Good ADL Goals Pt Will Perform Lower Body Dressing: with modified independence;sit to/from stand Pt Will Transfer to Toilet: with modified independence;ambulating Pt/caregiver will Perform Home Exercise Program: Increased strength;Both right and left upper extremity;With theraband;Independently;With written HEP provided Additional ADL Goal #1: Pt to verbalize at least 3 energy conservation strategies that can be implemented in daily routine Additional ADL Goal #2: Pt to verbalize at least 3 strategies to implement in routine to prevent CHF exacerbation  Plan Discharge plan remains appropriate    Co-evaluation                 AM-PAC OT "6 Clicks" Daily Activity     Outcome Measure   Help from another person eating meals?: None Help from another person taking care of personal grooming?: A Little Help from another person toileting, which includes using toliet, bedpan, or urinal?: A Little Help from another person bathing (including washing, rinsing, drying)?: A Little Help from another person to put on and taking off regular upper body clothing?: A Little Help from another person to put on and taking off regular lower body clothing?: A Little 6 Click Score: 19    End of Session Equipment Utilized During Treatment: Rollator (4 wheels);Oxygen;Other (comment) (2 liters)  OT Visit Diagnosis: Muscle weakness (generalized) (M62.81)   Activity Tolerance Other (comment) (began having chest pains and complaints of dizziness at end of session.)   Patient Left in chair;with call bell/phone within reach;with nursing/sitter in room   Nurse Communication Mobility status;Other (comment) (complaints of chest pain and dizziness)        Time: 4742-5956 OT Time Calculation (min): 25 min  Charges: OT General  Charges $OT Visit: 1 Visit OT Treatments $Therapeutic Activity: 23-37 mins  Alfonse Flavors, OTA Acute Rehabilitation Services  Office [phone]   Dewain Penning 03/04/2022, 12:14 PM

## 2022-03-04 NOTE — Progress Notes (Signed)
Heart Failure Stewardship Pharmacist Progress Note   PCP: Margot Ables, MD (Inactive) PCP-Cardiologist: Bryan Lemma, MD    HPI:  51 yo M with PMH of HTN, T2DM, HLD, HFrEF since ~2000, polysubstance use, OSA on CPAP, and PPM.   He presented to the ED on 8/31 with chest pain, shortness of breath, and LE edema. He reports he took 320 mg of lasix without any improvement. CXR with mild vascular congestion without pulmonary edema. He states he got out of drug and alcohol rehab 3 days PTA, but UDS was positive for cocaine on admission. ECHO on 9/1 showed LVEF 25%, RV mildly reduced.   Current HF Medications: Diuretic: furosemide 80 mg IV BID Beta Blocker: carvedilol 12.5 mg BID ACE/ARB/ARNI: Entresto 24/26 mg BID MRA: spironolactone 25 mg daily SGLT2i: Farxiga 10 mg daily  Prior to admission HF Medications: Diuretic: furosemide 80 mg daily Beta blocker: carvedilol 6.25 mg BID *metoprolol XL also on list but not reporting as taking  Pertinent Lab Values: Serum creatinine 1.14, BUN 22, Potassium 4.5, Sodium 134, BNP 136.5, Magnesium 2.3, A1c 9.1  Vital Signs: Weight: 290 lbs (admission weight: 300 lbs) Blood pressure: 120-140/70s  Heart rate: 80-90s (v-paced)  I/O: -2.1L yesterday; net -13L  Medication Assistance / Insurance Benefits Check: Does the patient have prescription insurance?  Yes Type of insurance plan: [place] Medicaid  Outpatient Pharmacy:  Prior to admission outpatient pharmacy: [facility] Is the patient willing to use [facility] TOC pharmacy at discharge? Yes Is the patient willing to transition their outpatient pharmacy to utilize a [facility] outpatient pharmacy?   Pending    Assessment: 1. Acute on chronic systolic CHF (LVEF 25%), due to NICM. NYHA class III symptoms. - Continue furosemide 80 mg IV BID. Strict I/Os. Daily weights. Keep K>4 and Mag>2.   - Agree with increasing to carvedilol 12.5 mg BID - Continue Entresto 24/26 mg BID - reported cough  with lisinopril, monitor for side effects  - Continue spironolactone 25 mg daily - Continue Farxiga 10 mg daily   Plan: 1) Medication changes recommended at this time: - Agree with changes - Remove metoprolol XL from med list on discharge  2) Patient assistance: - Has [place] Medicaid - Entresto copay $0 - Farxiga copay $0  3)  Education  - To be completed prior to discharge  Sharen Hones, PharmD, BCPS Heart Failure Engineer, building services Phone [phone]

## 2022-03-04 NOTE — Progress Notes (Signed)
Patient will self place CPAP when ready, RT will continue to monitor patient.

## 2022-03-04 NOTE — Progress Notes (Signed)
PROGRESS NOTE  Luis [name] [id number] DOB: January 19, 1971   PCP: Buzzy Han, MD (Inactive)  Patient is from: Home.  DOA: 02/26/2022 LOS: 5  Chief complaints Chief Complaint  Patient presents with   Shortness of Breath   Chest Pain     Brief Narrative / Interim history: 51 year old M with PMH of combined CHF, PPM, chronic hypoxic RF on 2.5 L, OSA on CPAP, morbid obesity and ongoing cocaine use disorder presenting with progressive shortness of breath for about a week despite increasing his home Lasix, and admitted for acute on chronic combined CHF.  BNP 136.  Chest x-ray consistent with CHF.  Urine is positive for cocaine.  TTE ordered.  Patient was started on IV Lasix.  TTE with LVEF of 25% (30 to 35% in 2022)., GH but not able to assess RWMA, diastolic function.  Cardiology following.  Remains on IV Lasix.  Subjective: Seen and examined earlier this morning.  No major events overnight of this morning.  He says he was up since 3 AM this morning.  He is coughing with some gray phlegm.  He states his breathing is about the same although he is on room air.  Denies chest pain.  Objective: Vitals:   03/03/22 1935 03/04/22 0625 03/04/22 0849 03/04/22 0900  BP: 116/67 126/77 132/61 (!) 140/79  Pulse: 93 90 100   Resp: 19 19 20 20   Temp: 98.2 F (36.8 C) 98.1 F (36.7 C) 98 F (36.7 C)   TempSrc: Oral Oral Oral   SpO2: 90% 94% 95%   Weight:  131.8 kg    Height:        Examination:  GENERAL: No apparent distress.  Nontoxic. HEENT: MMM.  Vision and hearing grossly intact.  NECK: Supple.  Difficult to assess JVD due to body habitus. RESP:  No IWOB.  Fair aeration bilaterally. CVS:  RRR. Heart sounds normal.  ABD/GI/GU: BS+. Abd soft, NTND.  MSK/EXT:  Moves extremities. No apparent deformity. No edema.  SKIN: no apparent skin lesion or wound NEURO: Awake and alert. Oriented appropriately.  No apparent focal neuro deficit. PSYCH: Calm. Normal affect.    Procedures:  None  Microbiology summarized: None  Assessment and plan: Principal Problem:   Acute on chronic combined systolic and diastolic CHF (congestive heart failure) (HCC) Active Problems:   Cocaine abuse (HCC)   Pacemaker   HLD (hyperlipidemia)   Diabetes mellitus without complication (HCC)   Hypertension   Tobacco abuse   Morbid obesity (HCC)   Sleep apnea   Chronic respiratory failure with hypoxia (HCC) - 2.5 L/min  Acute on chronic combined CHF: TTE with LVEF of 25%.  (LVEF of 30 to 35%, GH, G2-DD in 8/22).  Presents with progressive dyspnea.  BNP 136>> 87.  CXR consistent with CHF.  Pro-Cal negative arguing against bacterial infection.  UDS positive for cocaine.  Followed by [facility] cardiology.  Started on IV Lasix.  Net -13.4 L.  Cr stable.  Can continues to endorse shortness of breath.  Fluid status looks better although difficult exam -Cardiology following-resumed IV Lasix at 80 mg twice daily -GDMT-Farxiga, Coreg, Aldactone and Entresto -Strict I and O, daily weight, renal functions and electrolytes -Encouraged to stop cocaine use.  History of ventricular tachycardia/PPM: PPM approaching end of battery life. -Need generator change by primary EP cardiology at [facility] upon discharge.   Cocaine abuse: UDS positive but he denies use stating that he was around someone who was using cocaine. -Advised to refrain from cocaine.  Chronic respiratory failure with hypoxia: Stable on home 2.5 L -Continue home oxygen and CPAP -Manage CHF as above   Obstructive sleep apnea -Continue CPAP at night.   Hypertension: Normotensive for most part. -Cardiac meds as above.   IDDM-2 with hyperglycemia and HLD: Seems he is on Lantus, NovoLog and Victoza at home.  A1c 9.1%. Recent Labs  Lab 03/03/22 1115 03/03/22 1548 03/03/22 2048 03/04/22 0621 03/04/22 1126  GLUCAP 179* 136* 156* 171* 145*  -Continue SSI to resistant scale -Continue NovoLog 8 units 3 times daily with  meals -Continue Semglee 25 units daily -Continue home Crestor.  Mood disorder: Stable. -Continue home meds.  Hypokalemia/hyperphosphatemia: Hypokalemia resolved.  P5.3. -Monitor replenish as appropriate  Productive cough: Low suspicion for bowel general pneumonia.  No leukocytosis, fever.  Pro-Cal negative.  He denies smoking.  Has history of ACE inhibitor intolerance.  -Manage CHF as above -Continue nebs and PPI.  Left ear pain: small tissue growth with small pus looking tip over posterior wall of external ear canal on otoscopic exam.  Improved. -Continue home antibiotic drop  Euthyroid sick syndrome: TSH low but free T4 normal. -Repeat TFT in 4 to 6 weeks.  Generalized weakness/physical deconditioning -HH PT/aide/rollator ordered.  Morbid obesity Body mass index is 47.62 kg/m. -Continue Victoza on discharge          DVT prophylaxis:  heparin injection 5,000 Units Start: 02/26/22 0630 SCDs Start: 02/26/22 9470  Code Status: Full code Family Communication: None at bedside Level of care: Telemetry Cardiac Status is: Inpatient The patient will remain inpatient because: Acute combined CHF/fluid overload requiring IV Lasix   Final disposition: Likely home once cleared Consultants:  Cardiology  Sch Meds:  Scheduled Meds:  carvedilol  12.5 mg Oral BID WC   clopidogrel  75 mg Oral Daily   dapagliflozin propanediol  10 mg Oral Daily   DULoxetine  90 mg Oral Daily   furosemide  80 mg Intravenous BID   heparin  5,000 Units Subcutaneous Q8H   insulin aspart  0-20 Units Subcutaneous TID WC   insulin aspart  0-5 Units Subcutaneous QHS   insulin aspart  8 Units Subcutaneous TID WC   insulin glargine-yfgn  25 Units Subcutaneous Daily   neomycin-polymyxin-hydrocortisone  4 drop Left EAR QID   pantoprazole  40 mg Oral Daily   prazosin  2 mg Oral QHS   rosuvastatin  20 mg Oral Daily   sacubitril-valsartan  1 tablet Oral BID   sodium chloride flush  3 mL Intravenous Q12H    spironolactone  25 mg Oral Daily   traZODone  200 mg Oral QHS   Continuous Infusions:  sodium chloride     PRN Meds:.sodium chloride, acetaminophen, hydrOXYzine, ipratropium-albuterol, labetalol, ondansetron (ZOFRAN) IV, sodium chloride flush  Antimicrobials: Anti-infectives (From admission, onward)    None        I have personally reviewed the following labs and images: CBC: Recent Labs  Lab 02/26/22 0315 02/27/22 0450 02/28/22 0308 03/01/22 0234 03/02/22 0225 03/03/22 0228  WBC 10.6* 9.2 7.6 7.9 8.1 9.0  NEUTROABS 7.1  --   --   --   --   --   HGB 11.7* 12.5* 12.7* 13.5 13.1 13.8  HCT 35.7* 38.2* 37.9* 41.6 40.3 40.9  MCV 89.5 88.6 87.3 88.9 87.6 87.0  PLT 249 261 258 278 293 268   BMP &GFR Recent Labs  Lab 02/27/22 0450 02/28/22 0308 03/01/22 0234 03/02/22 0225 03/03/22 0228 03/04/22 0926  NA 139 141 138 139 136  134*  K 3.2* 4.2 3.9 3.8 3.8 4.5  CL 99 103 101 102 101 100  CO2 28 27 28 27 26 24   GLUCOSE 140* 250* 187* 190* 196* 248*  BUN 13 18 17  23* 25* 22*  CREATININE 1.01 1.05 0.98 1.21 1.08 1.14  CALCIUM 8.9 9.2 9.2 9.0 9.0 9.1  MG 2.1 2.3 2.3 2.2 2.4 2.3  PHOS 5.2* 4.9* 5.2* 5.4* 5.3*  --    Estimated Creatinine Clearance: 95.7 mL/min (by C-G formula based on SCr of 1.14 mg/dL). Liver & Pancreas: Recent Labs  Lab 02/27/22 0450 02/28/22 0308 03/01/22 0234 03/02/22 0225 03/03/22 0228  AST 22  --   --   --   --   ALT 20  --   --   --   --   ALKPHOS 45  --   --   --   --   BILITOT 0.5  --   --   --   --   PROT 7.0  --   --   --   --   ALBUMIN 3.4* 3.4* 3.5 3.5 3.7   No results for input(s): "LIPASE", "AMYLASE" in the last 168 hours. No results for input(s): "AMMONIA" in the last 168 hours. Diabetic: No results for input(s): "HGBA1C" in the last 72 hours.  Recent Labs  Lab 03/03/22 1115 03/03/22 1548 03/03/22 2048 03/04/22 0621 03/04/22 1126  GLUCAP 179* 136* 156* 171* 145*   Cardiac Enzymes: No results for input(s): "CKTOTAL",  "CKMB", "CKMBINDEX", "TROPONINI" in the last 168 hours. No results for input(s): "PROBNP" in the last 8760 hours. Coagulation Profile: No results for input(s): "INR", "PROTIME" in the last 168 hours. Thyroid Function Tests: No results for input(s): "TSH", "T4TOTAL", "FREET4", "T3FREE", "THYROIDAB" in the last 72 hours.  Lipid Profile: No results for input(s): "CHOL", "HDL", "LDLCALC", "TRIG", "CHOLHDL", "LDLDIRECT" in the last 72 hours. Anemia Panel: No results for input(s): "VITAMINB12", "FOLATE", "FERRITIN", "TIBC", "IRON", "RETICCTPCT" in the last 72 hours. Urine analysis:    Component Value Date/Time   COLORURINE YELLOW 08/05/2021 1429   APPEARANCEUR CLEAR 08/05/2021 1429   LABSPEC 1.030 08/05/2021 1429   PHURINE 5.0 08/05/2021 1429   GLUCOSEU >=500 (A) 08/05/2021 1429   HGBUR SMALL (A) 08/05/2021 1429   BILIRUBINUR NEGATIVE 08/05/2021 1429   KETONESUR NEGATIVE 08/05/2021 1429   PROTEINUR NEGATIVE 08/05/2021 1429   NITRITE NEGATIVE 08/05/2021 1429   LEUKOCYTESUR NEGATIVE 08/05/2021 1429   Sepsis Labs: Invalid input(s): "PROCALCITONIN", "LACTICIDVEN"  Microbiology: No results found for this or any previous visit (from the past 240 hour(s)).  Radiology Studies: No results found.    Marvie Brevik T. Johana Hopkinson [facility]  If 7PM-7AM, please contact night-coverage [email or web address] 03/04/2022, 1:55 PM

## 2022-03-04 NOTE — Progress Notes (Signed)
Rounding Note    Patient Name: Luis [name] Date of Encounter: 03/04/2022   [facility] Cardiologist: Bryan Lemma, MD   Subjective   States he "feels horrible." Continues to have cough when laying flat. Continues to have dyspnea on exertion and chest pain with ambulation.  Net negative 1.8L overnight Wt 292>290lbs BMET pending  Inpatient Medications    Scheduled Meds:  carvedilol  6.25 mg Oral BID WC   clopidogrel  75 mg Oral Daily   dapagliflozin propanediol  10 mg Oral Daily   DULoxetine  90 mg Oral Daily   furosemide  80 mg Intravenous BID   heparin  5,000 Units Subcutaneous Q8H   insulin aspart  0-20 Units Subcutaneous TID WC   insulin aspart  0-5 Units Subcutaneous QHS   insulin aspart  8 Units Subcutaneous TID WC   insulin glargine-yfgn  25 Units Subcutaneous Daily   neomycin-polymyxin-hydrocortisone  4 drop Left EAR QID   pantoprazole  40 mg Oral Daily   prazosin  2 mg Oral QHS   rosuvastatin  20 mg Oral Daily   sacubitril-valsartan  1 tablet Oral BID   sodium chloride flush  3 mL Intravenous Q12H   spironolactone  25 mg Oral Daily   traZODone  200 mg Oral QHS   Continuous Infusions:  sodium chloride     PRN Meds: sodium chloride, acetaminophen, hydrOXYzine, ipratropium-albuterol, labetalol, ondansetron (ZOFRAN) IV, sodium chloride flush   Vital Signs    Vitals:   03/03/22 1935 03/04/22 0625 03/04/22 0849 03/04/22 0900  BP: 116/67 126/77 132/61 (!) 140/79  Pulse: 93 90 100   Resp: 19 19 20 20   Temp: 98.2 F (36.8 C) 98.1 F (36.7 C) 98 F (36.7 C)   TempSrc: Oral Oral Oral   SpO2: 90% 94% 95%   Weight:  131.8 kg    Height:        Intake/Output Summary (Last 24 hours) at 03/04/2022 1005 Last data filed at 03/04/2022 0900 Gross per 24 hour  Intake 960 ml  Output 3100 ml  Net -2140 ml       03/04/2022    6:25 AM 03/03/2022    6:22 AM 03/02/2022    5:34 AM  Last 3 Weights  Weight (lbs) 290 lb 9.6 oz 292 lb 1.6 oz 295 lb 14.4  oz  Weight (kg) 131.815 kg 132.496 kg 134.219 kg      Telemetry    A-sensed, v-paced - Personally Reviewed  ECG    No new tracing today - Personally Reviewed  Physical Exam   GEN: Sitting up in a chair, comfortable Neck: JVD difficult to assess due to body habitus Cardiac: Tachycardic, regular, no murmurs Respiratory: Diminished but clear GI: Soft, nontender, non-distended  MS: Trace edema, warm Neuro:  Nonfocal  Psych: Normal affect   Labs    High Sensitivity Troponin:   Recent Labs  Lab 02/26/22 0739 02/27/22 2012 02/27/22 2256 02/28/22 1859 02/28/22 2030  TROPONINIHS 12 19* 20* 14 15      Chemistry Recent Labs  Lab 02/27/22 0450 02/28/22 0308 03/01/22 0234 03/02/22 0225 03/03/22 0228  NA 139   < > 138 139 136  K 3.2*   < > 3.9 3.8 3.8  CL 99   < > 101 102 101  CO2 28   < > 28 27 26   GLUCOSE 140*   < > 187* 190* 196*  BUN 13   < > 17 23* 25*  CREATININE 1.01   < >  0.98 1.21 1.08  CALCIUM 8.9   < > 9.2 9.0 9.0  MG 2.1   < > 2.3 2.2 2.4  PROT 7.0  --   --   --   --   ALBUMIN 3.4*   < > 3.5 3.5 3.7  AST 22  --   --   --   --   ALT 20  --   --   --   --   ALKPHOS 45  --   --   --   --   BILITOT 0.5  --   --   --   --   GFRNONAA >60   < > >60 >60 >60  ANIONGAP 12   < > 9 10 9    < > = values in this interval not displayed.     Lipids No results for input(s): "CHOL", "TRIG", "HDL", "LABVLDL", "LDLCALC", "CHOLHDL" in the last 168 hours.  Hematology Recent Labs  Lab 03/01/22 0234 03/02/22 0225 03/03/22 0228  WBC 7.9 8.1 9.0  RBC 4.68 4.60 4.70  HGB 13.5 13.1 13.8  HCT 41.6 40.3 40.9  MCV 88.9 87.6 87.0  MCH 28.8 28.5 29.4  MCHC 32.5 32.5 33.7  RDW 13.2 13.1 12.9  PLT 278 293 268    Thyroid  Recent Labs  Lab 03/01/22 0918  TSH 0.117*  FREET4 1.11     BNP Recent Labs  Lab 02/27/22 0450 02/28/22 0308 03/03/22 0228  BNP 100.7* 87.4 62.2     DDimer No results for input(s): "DDIMER" in the last 168 hours.   Radiology    No  results found.  Cardiac Studies   TTE 02/27/2022  1. Left ventricular ejection fraction, by estimation, is 25%. The left  ventricle has severely decreased function. The left ventricle demonstrates  global hypokinesis. Cannot comment on regional wall motion abnormalities  and Definity contrast was not  attempted. Consider repeat limited echo for RWM assessment if clinically  indicated. The left ventricular internal cavity size was mildly dilated.  Left ventricular diastolic parameters are indeterminate.   2. Right ventricular systolic function is mildly reduced. The right  ventricular size is normal. Tricuspid regurgitation signal is inadequate  for assessing PA pressure.   3. The mitral valve is grossly normal. Trivial mitral valve  regurgitation. No evidence of mitral stenosis.   4. The aortic valve is grossly normal. There is mild calcification of the  aortic valve. Aortic valve regurgitation is not visualized. No aortic  stenosis is present.   5. The inferior vena cava is normal in size with greater than 50%  respiratory variability, suggesting right atrial pressure of 3 mmHg.   Patient Profile     51 y.o. male with history of nonischemic cardiomyopathy, chronic systolic heart failure status post BiV ICD, chronic respiratory failure on 2 L nasal cannula, OSA, diabetes, cocaine abuse who was admitted on 02/26/2022 for acute on chronic systolic heart failure.  Assessment & Plan    #Acute on Chronic Systolic HF, EF 25%: #Non-ischemic CM: Patient presented with worsening chest pain and SOB found to be overloaded on exam with pulmonary edema on CXR consistent with acute on chronic systolic heart failure. Has been diuresing well. Volume status difficult to assess given body habitus. BMET pending today. Given degree of symptoms and hypoxia with ambulation, will continue IV diuresis today. Patient states dry weight is around 280lbs and he is currently 290lbs. -Continue lasix 80mg  IV  BID -Follow-up BMET -Increase coreg to 12.5mg  BID given tachycardia -Continue  entresto 24/26mg  BID -Continue spiro 25mg  daily -Continue farxiga 10mg  daily -Discussed importance of cocaine cessation -ICD is approaching end-of-life and will need generator change to be performed by [facility]  #Sinus Tachycardia: Elevated today. -Increase coreg to 12.5mg  BID -Can add ivabradine if needed  #Chronic Respiratory Failure with Hypoxia: -Continue home O2 and CPAP  #OSA: -Continue CPAP  #DMII: -Management per IM  #Morbid Obesity: BMI 48.5.      For questions or updates, please contact [place] Please consult [email or web address] for contact info under        Signed, Freada Bergeron, MD  03/04/2022, 10:05 AM

## 2022-03-04 NOTE — Progress Notes (Signed)
Patient was walking with PT. Complained of dizziness and chest discomfort.  Patient was able to walk back to his room and get in his chair.  Vitals WNL. MD made aware. No orders received at this time. Will continue to monitor.

## 2022-03-05 DIAGNOSIS — F1410 Cocaine abuse, uncomplicated: Secondary | ICD-10-CM | POA: Diagnosis not present

## 2022-03-05 DIAGNOSIS — J9611 Chronic respiratory failure with hypoxia: Secondary | ICD-10-CM | POA: Diagnosis not present

## 2022-03-05 DIAGNOSIS — E119 Type 2 diabetes mellitus without complications: Secondary | ICD-10-CM | POA: Diagnosis not present

## 2022-03-05 DIAGNOSIS — I5043 Acute on chronic combined systolic (congestive) and diastolic (congestive) heart failure: Secondary | ICD-10-CM | POA: Diagnosis not present

## 2022-03-05 LAB — RENAL FUNCTION PANEL
Albumin: 3.8 g/dL (ref 3.5–5.0)
Anion gap: 9 (ref 5–15)
BUN: 29 mg/dL — ABNORMAL HIGH (ref 6–20)
CO2: 26 mmol/L (ref 22–32)
Calcium: 9.1 mg/dL (ref 8.9–10.3)
Chloride: 101 mmol/L (ref 98–111)
Creatinine, Ser: 1.26 mg/dL — ABNORMAL HIGH (ref 0.61–1.24)
GFR, Estimated: >60 mL/min (ref >60)
Glucose, Bld: 225 mg/dL — ABNORMAL HIGH (ref 70–99)
Phosphorus: 4.8 mg/dL — ABNORMAL HIGH (ref 2.5–4.6)
Potassium: 3.9 mmol/L (ref 3.5–5.1)
Sodium: 136 mmol/L (ref 135–145)

## 2022-03-05 LAB — CBC
HCT: 42.9 % (ref 39.0–52.0)
Hemoglobin: 14.5 g/dL (ref 13.0–17.0)
MCH: 29.1 pg (ref 26.0–34.0)
MCHC: 33.8 g/dL (ref 30.0–36.0)
MCV: 86.1 fL (ref 80.0–100.0)
Platelets: 286 10*3/uL (ref 150–400)
RBC: 4.98 MIL/uL (ref 4.22–5.81)
RDW: 13.1 % (ref 11.5–15.5)
WBC: 11.3 10*3/uL — ABNORMAL HIGH (ref 4.0–10.5)
nRBC: 0 % (ref 0.0–0.2)

## 2022-03-05 LAB — GLUCOSE, CAPILLARY
Glucose-Capillary: 228 mg/dL — ABNORMAL HIGH (ref 70–99)
Glucose-Capillary: 204 mg/dL — ABNORMAL HIGH (ref 70–99)
Glucose-Capillary: 187 mg/dL — ABNORMAL HIGH (ref 70–99)
Glucose-Capillary: 238 mg/dL — ABNORMAL HIGH (ref 70–99)

## 2022-03-05 LAB — MAGNESIUM: Magnesium: 2.4 mg/dL (ref 1.7–2.4)

## 2022-03-05 MED ORDER — BENZONATATE 100 MG PO CAPS: 100.0000 mg | ORAL_CAPSULE | Freq: Three times a day (TID) | ORAL | Status: DC | PRN

## 2022-03-05 MED ORDER — INSULIN GLARGINE-YFGN 100 UNIT/ML ~~LOC~~ SOLN
10.0000 [IU] | Freq: Once | SUBCUTANEOUS | Status: AC
  Administered 2022-03-05: 10 [IU] via SUBCUTANEOUS
  Filled 2022-03-05: qty 0.1

## 2022-03-05 MED ORDER — FUROSEMIDE 40 MG PO TABS
80.0000 mg | ORAL_TABLET | Freq: Every day | ORAL | Status: DC
Start: 2022-03-06 — End: 2022-03-08
  Administered 2022-03-06 – 2022-03-08 (×3): 80 mg via ORAL
  Filled 2022-03-05 (×3): qty 2

## 2022-03-05 MED ORDER — INSULIN GLARGINE-YFGN 100 UNIT/ML ~~LOC~~ SOLN
20.0000 [IU] | Freq: Two times a day (BID) | SUBCUTANEOUS | Status: DC
  Administered 2022-03-06 – 2022-03-08 (×4): 20 [IU] via SUBCUTANEOUS
  Filled 2022-03-05 (×6): qty 0.2

## 2022-03-05 NOTE — Progress Notes (Addendum)
Heart Failure Stewardship Pharmacist Progress Note   PCP: Margot Ables, MD (Inactive) PCP-Cardiologist: Bryan Lemma, MD    HPI:  51 yo M with PMH of HTN, T2DM, HLD, HFrEF since ~2000, polysubstance use, OSA on CPAP, and PPM.   He presented to the ED on 8/31 with chest pain, shortness of breath, and LE edema. He reports he took 320 mg of lasix without any improvement. CXR with mild vascular congestion without pulmonary edema. He states he got out of drug and alcohol rehab 3 days PTA, but UDS was positive for cocaine on admission. ECHO on 9/1 showed LVEF 25%, RV mildly reduced. Plan for RHC on 9/8.  Current HF Medications: Diuretic: furosemide 80 mg PO daily Beta Blocker: carvedilol 12.5 mg BID ACE/ARB/ARNI: Entresto 24/26 mg BID MRA: spironolactone 25 mg daily SGLT2i: Farxiga 10 mg daily  Prior to admission HF Medications: Diuretic: furosemide 80 mg daily Beta blocker: carvedilol 6.25 mg BID *metoprolol XL also on list but not reporting as taking  Pertinent Lab Values: Serum creatinine 1.26, BUN 29, Potassium 3.9, Sodium 136, BNP 136.5, Magnesium 2.4, A1c 9.1  Vital Signs: Weight: 290 lbs (admission weight: 300 lbs) Blood pressure: 120-130/70s  Heart rate: 90s (v-paced)  I/O: -4.8L yesterday; net -16L  Medication Assistance / Insurance Benefits Check: Does the patient have prescription insurance?  Yes Type of insurance plan: [place] Medicaid  Outpatient Pharmacy:  Prior to admission outpatient pharmacy: [facility] Is the patient willing to use [facility] TOC pharmacy at discharge? Yes Is the patient willing to transition their outpatient pharmacy to utilize a [facility] outpatient pharmacy?   Pending    Assessment: 1. Acute on chronic systolic CHF (LVEF 25%), due to NICM. NYHA class II symptoms. - Agree with transitioning to furosemide 80 mg PO daily. Strict I/Os. Daily weights. Keep K>4 and Mag>2.   - Continue carvedilol 12.5 mg BID - Continue Entresto 24/26  mg BID - reported cough with lisinopril, monitor for side effects  - Continue spironolactone 25 mg daily - Continue Farxiga 10 mg daily   Plan: 1) Medication changes recommended at this time: - Agree with changes - Remove metoprolol XL from med list on discharge  2) Patient assistance: - Has [place] Medicaid - Entresto copay $0 - Farxiga copay $0  3)  Education  - To be completed prior to discharge  Sharen Hones, PharmD, BCPS Heart Failure Engineer, building services Phone [phone]

## 2022-03-05 NOTE — Progress Notes (Signed)
03/05/22 1033  Mobility  Activity Ambulated with assistance in hallway  Level of Assistance Modified independent, requires aide device or extra time  Assistive Device Front wheel walker  Distance Ambulated (ft) 500 ft  Activity Response Tolerated fair  $Mobility charge 1 Mobility   Mobility Specialist Progress Note  Pt was in chair and agreeable. X4 seated breaks d/t fatigue. Returned back to chair w/all needs met and call bell in reach     Mobility Specialist   

## 2022-03-05 NOTE — Progress Notes (Signed)
Physical Therapy Treatment Patient Details Name: Luis [name] MRN: [id number] DOB: Feb 13, 1971 Today's Date: 03/05/2022   History of Present Illness Pt is a 51 y/o male admitted secondary to SOB. Thought to be secondary to CHF. PMH includes pacemaker, DM, tobacco use, HTN and substance abuse.    PT Comments    Pt tolerates treatment well, ambulating for increased distances. Pt does continue to require standing or seated breaks due to fatigue when mobilizing for longer household or limited community distances, this is far from his baseline as he reports enjoying walking his dog sometimes up to a mile at a time. PT encourages continued frequent ambulation in an effort to restore his prior level of function.   Recommendations for follow up therapy are one component of a multi-disciplinary discharge planning process, led by the attending physician.  Recommendations may be updated based on patient status, additional functional criteria and insurance authorization.  Follow Up Recommendations  Home health PT     Assistance Recommended at Discharge Intermittent Supervision/Assistance  Patient can return home with the following Assistance with cooking/housework   Equipment Recommendations  Rollator (4 wheels)    Recommendations for Other Services       Precautions / Restrictions Precautions Precautions: Fall Precaution Comments: monitor O2 Restrictions Weight Bearing Restrictions: No     Mobility  Bed Mobility                    Transfers Overall transfer level: Needs assistance Equipment used: Rollator (4 wheels) Transfers: Sit to/from Stand Sit to Stand: Supervision                Ambulation/Gait Ambulation/Gait assistance: Min guard Gait Distance (Feet): 150 Feet (additional trial of 100', seated rest break separating trials) Assistive device: Rollator (4 wheels) Gait Pattern/deviations: Step-through pattern Gait velocity: reduced Gait velocity  interpretation: <1.8 ft/sec, indicate of risk for recurrent falls   General Gait Details: pt with slowed step-through gait, 2 instances of posterior LOB which the pt corrects with stepping strategy and UE support   Stairs             Wheelchair Mobility    Modified Rankin (Stroke Patients Only)       Balance Overall balance assessment: Needs assistance Sitting-balance support: No upper extremity supported, Feet supported Sitting balance-Leahy Scale: Good     Standing balance support: No upper extremity supported, During functional activity Standing balance-Leahy Scale: Fair                              Cognition Arousal/Alertness: Awake/alert Behavior During Therapy: WFL for tasks assessed/performed, Anxious Overall Cognitive Status: Within Functional Limits for tasks assessed                                          Exercises      General Comments General comments (skin integrity, edema, etc.): VSS on 3L [place], tachy into low 100s with mobility      Pertinent Vitals/Pain Pain Assessment Pain Assessment: No/denies pain    Home Living                          Prior Function            PT Goals (current goals can now be found in the care plan  section) Acute Rehab PT Goals Patient Stated Goal: to return to independence Progress towards PT goals: Progressing toward goals    Frequency    Min 3X/week      PT Plan Current plan remains appropriate    Co-evaluation              AM-PAC PT "6 Clicks" Mobility   Outcome Measure  Help needed turning from your back to your side while in a flat bed without using bedrails?: None Help needed moving from lying on your back to sitting on the side of a flat bed without using bedrails?: None Help needed moving to and from a bed to a chair (including a wheelchair)?: A Little Help needed standing up from a chair using your arms (e.g., wheelchair or bedside chair)?: A  Little Help needed to walk in hospital room?: A Little Help needed climbing 3-5 steps with a railing? : A Little 6 Click Score: 20    End of Session Equipment Utilized During Treatment: Oxygen Activity Tolerance: Patient tolerated treatment well Patient left: in chair;with call bell/phone within reach Nurse Communication: Mobility status PT Visit Diagnosis: Unsteadiness on feet (R26.81);Muscle weakness (generalized) (M62.81);Difficulty in walking, not elsewhere classified (R26.2)     Time: 3154-0086 PT Time Calculation (min) (ACUTE ONLY): 17 min  Charges:  $Gait Training: 8-22 mins                     Arlyss Gandy, PT, DPT Acute Rehabilitation Office [phone]    Arlyss Gandy 03/05/2022, 2:44 PM

## 2022-03-05 NOTE — Progress Notes (Signed)
Pt states he will place cpap on himself when he is ready for bed. RT will check back on Pt later tonight.

## 2022-03-05 NOTE — Progress Notes (Signed)
PROGRESS NOTE  Luis [name] FXT:[id number] DOB: 1970/09/05   PCP: Margot Ables, MD (Inactive)  Patient is from: Home.  DOA: 02/26/2022 LOS: 6  Chief complaints Chief Complaint  Patient presents with   Shortness of Breath   Chest Pain     Brief Narrative / Interim history: 51 year old M with PMH of combined CHF, PPM, chronic hypoxic RF on 2.5 L, OSA on CPAP, morbid obesity and ongoing cocaine use disorder presenting with progressive shortness of breath for about a week despite increasing his home Lasix, and admitted for acute on chronic combined CHF.  BNP 136.  Chest x-ray consistent with CHF.  Urine is positive for cocaine.  TTE ordered.  Patient was started on IV Lasix.  TTE with LVEF of 25% (30 to 35% in 2022)., GH but not able to assess RWMA, diastolic function.  Cardiology following.  Remains on IV Lasix.  Plan for RHC on 9/8.  Subjective: Seen and examined earlier this morning.  Reports having a rough night.  Still with some productive cough.  Also desaturation to 70s when he tried to lie flat.  His oxygen improves upright.  He denies chest pain.  Objective: Vitals:   03/04/22 0625 03/04/22 0849 03/04/22 0900 03/04/22 2100  BP: 126/77 132/61 (!) 140/79 108/68  Pulse: 90 100    Resp: 19 20 20    Temp: 98.1 F (36.7 C) 98 F (36.7 C)  98.2 F (36.8 C)  TempSrc: Oral Oral  Oral  SpO2: 94% 95%    Weight: 131.8 kg     Height:        Examination:  GENERAL: No apparent distress.  Nontoxic. HEENT: MMM.  Vision and hearing grossly intact.  NECK: Supple.  Difficult to assess JVD due to body habitus. RESP:  No IWOB.  Fair aeration bilaterally but limited exam due to body habitus. CVS:  RRR. Heart sounds normal.  ABD/GI/GU: BS+. Abd soft, NTND.  MSK/EXT:  Moves extremities. No apparent deformity. No edema.  SKIN: no apparent skin lesion or wound NEURO: Awake and alert. Oriented appropriately.  No apparent focal neuro deficit. PSYCH: Calm. Normal affect.    Procedures:  None  Microbiology summarized: None  Assessment and plan: Principal Problem:   Acute on chronic combined systolic and diastolic CHF (congestive heart failure) (HCC) Active Problems:   Cocaine abuse (HCC)   Pacemaker   HLD (hyperlipidemia)   Diabetes mellitus without complication (HCC)   Hypertension   Tobacco abuse   Morbid obesity (HCC)   Sleep apnea   Chronic respiratory failure with hypoxia (HCC) - 2.5 L/min  Acute on chronic combined CHF: TTE with LVEF of 25%.  (LVEF of 30 to 35%, GH, G2-DD in 8/22).  Presents with progressive dyspnea.  BNP 136>> 87.  CXR consistent with CHF.  Pro-Cal negative arguing against bacterial infection.  UDS positive for cocaine.  Followed by [facility] cardiology.  Started on IV Lasix.  Net -15 L so far.  Cr lightly up today.  Continues to have SOB, orthopnea.  Fluid status difficult due to body habitus. -Cardiology following-planning RHC.  Remains on IV Lasix with plan to change to p.o. tomorrow -GDMT-Farxiga, Coreg, Aldactone and Entresto -Strict I and O, daily weight, renal functions and electrolytes -Encouraged to stop cocaine use.  History of ventricular tachycardia/PPM: PPM approaching end of battery life. -Need generator change by primary EP cardiology at [facility] upon discharge.   Cocaine abuse: UDS positive but he denies use stating that he was around someone who was using  cocaine. -Advised to refrain from cocaine.   Chronic respiratory failure with hypoxia: Stable on home 2.5 L -Continue home oxygen and CPAP -Manage CHF as above   Obstructive sleep apnea -Continue CPAP at night.   Hypertension: Normotensive for most part. -Cardiac meds as above.   IDDM-2 with hyperglycemia and HLD: Seems he is on Lantus, NovoLog and Victoza at home.  A1c 9.1%. Recent Labs  Lab 03/04/22 1126 03/04/22 1637 03/04/22 2130 03/05/22 0632 03/05/22 1118  GLUCAP 145* 145* 189* 228* 204*  -Continue SSI to resistant scale -Continue NovoLog  8 units 3 times daily with meals -Received Semglee 25 units this AM.  Give additional 10 units today and increase to 20u BID tomorrow. -Continue home Crestor.  Mood disorder: Stable. -Continue home meds.  Hypokalemia/hyperphosphatemia: Hypokalemia resolved.  P 4.8. -Monitor replenish as appropriate  Productive cough: Low suspicion for bowel general pneumonia.  No leukocytosis, fever.  Pro-Cal negative.  He denies smoking.  Has history of ACE inhibitor intolerance.  -Manage CHF as above -Continue nebs and PPI. -Try Tessalon Perles.  Left ear pain: small tissue growth with small pus looking tip over posterior wall of external ear canal on otoscopic exam.  Improved. -Continue home antibiotic drop  Euthyroid sick syndrome: TSH low but free T4 normal. -Repeat TFT in 4 to 6 weeks.  Generalized weakness/physical deconditioning -HH PT/aide/rollator ordered.  Morbid obesity Body mass index is 47.62 kg/m. -Continue Victoza on discharge          DVT prophylaxis:  heparin injection 5,000 Units Start: 02/26/22 0630 SCDs Start: 02/26/22 1751  Code Status: Full code Family Communication: None at bedside Level of care: Telemetry Cardiac Status is: Inpatient The patient will remain inpatient because: Acute combined CHF/fluid overload requiring IV Lasix   Final disposition: Likely home once cleared by cardiology Consultants:  Cardiology  Sch Meds:  Scheduled Meds:  carvedilol  12.5 mg Oral BID WC   clopidogrel  75 mg Oral Daily   dapagliflozin propanediol  10 mg Oral Daily   DULoxetine  90 mg Oral Daily   [START ON 03/06/2022] furosemide  80 mg Oral Daily   heparin  5,000 Units Subcutaneous Q8H   insulin aspart  0-20 Units Subcutaneous TID WC   insulin aspart  0-5 Units Subcutaneous QHS   insulin aspart  8 Units Subcutaneous TID WC   insulin glargine-yfgn  25 Units Subcutaneous Daily   neomycin-polymyxin-hydrocortisone  4 drop Left EAR QID   pantoprazole  40 mg Oral Daily    prazosin  2 mg Oral QHS   rosuvastatin  20 mg Oral Daily   sacubitril-valsartan  1 tablet Oral BID   sodium chloride flush  3 mL Intravenous Q12H   spironolactone  25 mg Oral Daily   traZODone  200 mg Oral QHS   Continuous Infusions:  sodium chloride     PRN Meds:.sodium chloride, acetaminophen, benzonatate, hydrOXYzine, ipratropium-albuterol, labetalol, ondansetron (ZOFRAN) IV, sodium chloride flush  Antimicrobials: Anti-infectives (From admission, onward)    None        I have personally reviewed the following labs and images: CBC: Recent Labs  Lab 02/28/22 0308 03/01/22 0234 03/02/22 0225 03/03/22 0228 03/05/22 0343  WBC 7.6 7.9 8.1 9.0 11.3*  HGB 12.7* 13.5 13.1 13.8 14.5  HCT 37.9* 41.6 40.3 40.9 42.9  MCV 87.3 88.9 87.6 87.0 86.1  PLT 258 278 293 268 286   BMP &GFR Recent Labs  Lab 02/28/22 0308 03/01/22 0234 03/02/22 0225 03/03/22 0228 03/04/22 0258 03/05/22 0343  NA 141 138 139 136 134* 136  K 4.2 3.9 3.8 3.8 4.5 3.9  CL 103 101 102 101 100 101  CO2 27 28 27 26 24 26   GLUCOSE 250* 187* 190* 196* 248* 225*  BUN 18 17 23* 25* 22* 29*  CREATININE 1.05 0.98 1.21 1.08 1.14 1.26*  CALCIUM 9.2 9.2 9.0 9.0 9.1 9.1  MG 2.3 2.3 2.2 2.4 2.3 2.4  PHOS 4.9* 5.2* 5.4* 5.3*  --  4.8*   Estimated Creatinine Clearance: 86.6 mL/min (A) (by C-G formula based on SCr of 1.26 mg/dL (H)). Liver & Pancreas: Recent Labs  Lab 02/27/22 0450 02/28/22 0308 03/01/22 0234 03/02/22 0225 03/03/22 0228 03/05/22 0343  AST 22  --   --   --   --   --   ALT 20  --   --   --   --   --   ALKPHOS 45  --   --   --   --   --   BILITOT 0.5  --   --   --   --   --   PROT 7.0  --   --   --   --   --   ALBUMIN 3.4* 3.4* 3.5 3.5 3.7 3.8   No results for input(s): "LIPASE", "AMYLASE" in the last 168 hours. No results for input(s): "AMMONIA" in the last 168 hours. Diabetic: No results for input(s): "HGBA1C" in the last 72 hours.  Recent Labs  Lab 03/04/22 1126 03/04/22 1637  03/04/22 2130 03/05/22 0632 03/05/22 1118  GLUCAP 145* 145* 189* 228* 204*   Cardiac Enzymes: No results for input(s): "CKTOTAL", "CKMB", "CKMBINDEX", "TROPONINI" in the last 168 hours. No results for input(s): "PROBNP" in the last 8760 hours. Coagulation Profile: No results for input(s): "INR", "PROTIME" in the last 168 hours. Thyroid Function Tests: No results for input(s): "TSH", "T4TOTAL", "FREET4", "T3FREE", "THYROIDAB" in the last 72 hours.  Lipid Profile: No results for input(s): "CHOL", "HDL", "LDLCALC", "TRIG", "CHOLHDL", "LDLDIRECT" in the last 72 hours. Anemia Panel: No results for input(s): "VITAMINB12", "FOLATE", "FERRITIN", "TIBC", "IRON", "RETICCTPCT" in the last 72 hours. Urine analysis:    Component Value Date/Time   COLORURINE YELLOW 08/05/2021 1429   APPEARANCEUR CLEAR 08/05/2021 1429   LABSPEC 1.030 08/05/2021 1429   PHURINE 5.0 08/05/2021 1429   GLUCOSEU >=500 (A) 08/05/2021 1429   HGBUR SMALL (A) 08/05/2021 1429   BILIRUBINUR NEGATIVE 08/05/2021 1429   KETONESUR NEGATIVE 08/05/2021 1429   PROTEINUR NEGATIVE 08/05/2021 1429   NITRITE NEGATIVE 08/05/2021 1429   LEUKOCYTESUR NEGATIVE 08/05/2021 1429   Sepsis Labs: Invalid input(s): "PROCALCITONIN", "LACTICIDVEN"  Microbiology: No results found for this or any previous visit (from the past 240 hour(s)).  Radiology Studies: No results found.    Quinnie Barcelo T. Janet Decesare [facility]  If 7PM-7AM, please contact night-coverage [email or web address] 03/05/2022, 12:03 PM

## 2022-03-05 NOTE — Progress Notes (Addendum)
Rounding Note    Patient Name: Luis [name] Date of Encounter: 03/05/2022  [facility] Cardiologist: Bryan Lemma, MD   Subjective   Pt was seen today bedside. He states that he has had some trouble sleeping, and last night he had an episode of hypoxemia, satting down to the 70s while resting in his bed. He is not able to lie flat and endorses a lingering cough.   Inpatient Medications    Scheduled Meds:  carvedilol  12.5 mg Oral BID WC   clopidogrel  75 mg Oral Daily   dapagliflozin propanediol  10 mg Oral Daily   DULoxetine  90 mg Oral Daily   [START ON 03/06/2022] furosemide  80 mg Oral Daily   heparin  5,000 Units Subcutaneous Q8H   insulin aspart  0-20 Units Subcutaneous TID WC   insulin aspart  0-5 Units Subcutaneous QHS   insulin aspart  8 Units Subcutaneous TID WC   insulin glargine-yfgn  25 Units Subcutaneous Daily   neomycin-polymyxin-hydrocortisone  4 drop Left EAR QID   pantoprazole  40 mg Oral Daily   prazosin  2 mg Oral QHS   rosuvastatin  20 mg Oral Daily   sacubitril-valsartan  1 tablet Oral BID   sodium chloride flush  3 mL Intravenous Q12H   spironolactone  25 mg Oral Daily   traZODone  200 mg Oral QHS   Continuous Infusions:  sodium chloride     PRN Meds: sodium chloride, acetaminophen, benzonatate, hydrOXYzine, ipratropium-albuterol, labetalol, ondansetron (ZOFRAN) IV, sodium chloride flush   Vital Signs    Vitals:   03/04/22 0625 03/04/22 0849 03/04/22 0900 03/04/22 2100  BP: 126/77 132/61 (!) 140/79 108/68  Pulse: 90 100    Resp: 19 20 20    Temp: 98.1 F (36.7 C) 98 F (36.7 C)  98.2 F (36.8 C)  TempSrc: Oral Oral  Oral  SpO2: 94% 95%    Weight: 131.8 kg     Height:        Intake/Output Summary (Last 24 hours) at 03/05/2022 1036 Last data filed at 03/04/2022 2100 Gross per 24 hour  Intake 490 ml  Output 2600 ml  Net -2110 ml      03/04/2022    6:25 AM 03/03/2022    6:22 AM 03/02/2022    5:34 AM  Last 3 Weights   Weight (lbs) 290 lb 9.6 oz 292 lb 1.6 oz 295 lb 14.4 oz  Weight (kg) 131.815 kg 132.496 kg 134.219 kg      Telemetry    Pacemaker rhythm  ECG    No new tracing - Personally Reviewed  Physical Exam   GEN: Comfortable sitting in chair  Neck: No JVD Cardiac: Tachycardic no murmurs, rubs, or gallops.  Respiratory: Clear to auscultation bilaterally. GI: Soft, nontender, non-distended  MS: Trace edema on bilateral lower extremities, L more than R; No deformity. Neuro:  Nonfocal  Psych: Normal affect   Labs    High Sensitivity Troponin:   Recent Labs  Lab 02/26/22 0739 02/27/22 2012 02/27/22 2256 02/28/22 1859 02/28/22 2030  TROPONINIHS 12 19* 20* 14 15     Chemistry Recent Labs  Lab 02/27/22 0450 02/28/22 0308 03/02/22 0225 03/03/22 0228 03/04/22 0926 03/05/22 0343  NA 139   < > 139 136 134* 136  K 3.2*   < > 3.8 3.8 4.5 3.9  CL 99   < > 102 101 100 101  CO2 28   < > 27 26 24 26   GLUCOSE  140*   < > 190* 196* 248* 225*  BUN 13   < > 23* 25* 22* 29*  CREATININE 1.01   < > 1.21 1.08 1.14 1.26*  CALCIUM 8.9   < > 9.0 9.0 9.1 9.1  MG 2.1   < > 2.2 2.4 2.3 2.4  PROT 7.0  --   --   --   --   --   ALBUMIN 3.4*   < > 3.5 3.7  --  3.8  AST 22  --   --   --   --   --   ALT 20  --   --   --   --   --   ALKPHOS 45  --   --   --   --   --   BILITOT 0.5  --   --   --   --   --   GFRNONAA >60   < > >60 >60 >60 >60  ANIONGAP 12   < > 10 9 10 9    < > = values in this interval not displayed.    Lipids No results for input(s): "CHOL", "TRIG", "HDL", "LABVLDL", "LDLCALC", "CHOLHDL" in the last 168 hours.  Hematology Recent Labs  Lab 03/02/22 0225 03/03/22 0228 03/05/22 0343  WBC 8.1 9.0 11.3*  RBC 4.60 4.70 4.98  HGB 13.1 13.8 14.5  HCT 40.3 40.9 42.9  MCV 87.6 87.0 86.1  MCH 28.5 29.4 29.1  MCHC 32.5 33.7 33.8  RDW 13.1 12.9 13.1  PLT 293 268 286   Thyroid  Recent Labs  Lab 03/01/22 0918  TSH 0.117*  FREET4 1.11    BNP Recent Labs  Lab 02/27/22 0450  02/28/22 0308 03/03/22 0228  BNP 100.7* 87.4 62.2    DDimer No results for input(s): "DDIMER" in the last 168 hours.   Radiology    No results found.  Cardiac Studies   TTE 02/27/2022  1. Left ventricular ejection fraction, by estimation, is 25%. The left  ventricle has severely decreased function. The left ventricle demonstrates  global hypokinesis. Cannot comment on regional wall motion abnormalities  and Definity contrast was not  attempted. Consider repeat limited echo for RWM assessment if clinically  indicated. The left ventricular internal cavity size was mildly dilated.  Left ventricular diastolic parameters are indeterminate.   2. Right ventricular systolic function is mildly reduced. The right  ventricular size is normal. Tricuspid regurgitation signal is inadequate  for assessing PA pressure.   3. The mitral valve is grossly normal. Trivial mitral valve  regurgitation. No evidence of mitral stenosis.   4. The aortic valve is grossly normal. There is mild calcification of the  aortic valve. Aortic valve regurgitation is not visualized. No aortic  stenosis is present.   5. The inferior vena cava is normal in size with greater than 50%  respiratory variability, suggesting right atrial pressure of 3 mmHg.   Patient Profile     51 y.o. male with a relevant past medical history of nonischemic cardiomyopathy, chornic systolic heart failure s/p BiV ICD, chornic respiratory failure on 2L nasal cannula, OSA, diabetes, cocaine abuse who was admitted on 02/26/22 for acute on chronic systolic heart failure.   Assessment & Plan  #Acute on Chronic Systolic HF, EF [id number] #Non-Ischemic Cardiomyopathy  CXR on admission revealed significant pulmonary edeme consistent with acute on chronic systolic heart failure. He was net -1900 yesterday, and weight has decreased from 132.5kg to 131.8kg. Currently waiting on weight and I/O recordings  today. He has been able to ambulate with the help of  physical therapy and mobility specialists, but only for short stints as patient starts to feel hypoxemic after going halfway down the hall. O2 has dropped to low 80s on ambulation accoding to patient. We will continue with IV Diuresis and hopes to obtain his dry weight of 280 pounds, as he currently sits at 290.5.  Kidney function has slightly worsened as creatinine has jumped to 1.26 from 1.14, and BUN has jumped to 29 from 22, which likely indicates patient is dry.   Plan:  - Switch from IV Lasix 80mg  to PO Lasix  - Continue coreg 12.5, farxiga 10mg , spironolactone 25mg , and entresto 24-26  - Restirct fluid intake to 1500 - Monitor I/O - Daily weights  - ICD will need generator change to be done at [facility]  - Will continue to assess respiratory status and need for potential R Heart cath due to persistent volume overloaded state   #Sinus tachycardia In the last 24 hours pulse has ranged from the 80-90 range and Coreg was increased to 12.5mg  BID yesterday.  - Can add ivabradine if needed   #Chronic Respiratory Failrue with Hypoxia  - Continue home O2 and CPAP   #OSA  -Continue CPAP   #Type II Diabetes mellitus  -Management per primary team   #Morbid Obesity BMI: 48.5       For questions or updates, please contact [facility] Please consult [email or web address] for contact info under        Signed, , MD  03/05/2022, 10:36 AM     Patient seen and examined and agree with [facility], MD as detailed above.  In brief, the patient is a 51 y.o. male with a past medical history of nonischemic cardiomyopathy, chronic systolic heart failure s/p BiV ICD, history of VT, chronic respiratory failure on 2L nasal cannula, OSA, diabetes, cocaine abuse who presented with acute heart failure exacerbation for which Cardiology was consulted.   Patient with known nonischemic cardiomyopathy (thought to be due to cocaine abuse) with LVEF 25%, mild RV dysfunction, no  significant valve disease on TTE on 02/27/22. Presented on this admission with acute on chronic exacerbation with UDS positive for cocaine but he denies recent use. Has been responding well to IV diuresis but continues to have dyspnea and cough when laying flat. Cr rose slightly today. Volume status difficult to assess given body habitus. Will plan for RHC to assess filling pressures to help guide diuresis.  GEN: Morbidly obese, comfortable   Neck: JVD difficult to assess due to body habitus Cardiac: Tachycardic, regular, no murmurs Respiratory: Clear to auscultation bilaterally. GI: Obese, soft MS: Trace edema, warm Neuro:  Nonfocal  Psych: Normal affect    Plan: -Plan for RHC to better assess volume status  -Given rising Cr and BUN, will transition to PO lasix tomorrow pending RHC numbers -Continue entresto 24-26mg  BID -Continue coreg 12.5mg  BID -Continue farxiga 10mg  daily -Continue spironolactone 25mg  daily -ICD at end-of-life, will need gent change with [facility] who he follows with -Discussed importance from abstaining from cocaine  Olegario Messier, MD

## 2022-03-06 ENCOUNTER — Encounter (HOSPITAL_COMMUNITY)
Admission: EM | Disposition: A | Discharge status: Home Health | Payer: Self-pay | Source: Home / Self Care | Attending: Student

## 2022-03-06 DIAGNOSIS — J9611 Chronic respiratory failure with hypoxia: Secondary | ICD-10-CM | POA: Diagnosis not present

## 2022-03-06 DIAGNOSIS — E119 Type 2 diabetes mellitus without complications: Secondary | ICD-10-CM | POA: Diagnosis not present

## 2022-03-06 DIAGNOSIS — I5020 Unspecified systolic (congestive) heart failure: Secondary | ICD-10-CM

## 2022-03-06 DIAGNOSIS — I5043 Acute on chronic combined systolic (congestive) and diastolic (congestive) heart failure: Secondary | ICD-10-CM | POA: Diagnosis not present

## 2022-03-06 DIAGNOSIS — F1410 Cocaine abuse, uncomplicated: Secondary | ICD-10-CM | POA: Diagnosis not present

## 2022-03-06 HISTORY — PX: RIGHT HEART CATH: CATH118263

## 2022-03-06 LAB — GLUCOSE, CAPILLARY
Glucose-Capillary: 260 mg/dL — ABNORMAL HIGH (ref 70–99)
Glucose-Capillary: 133 mg/dL — ABNORMAL HIGH (ref 70–99)
Glucose-Capillary: 112 mg/dL — ABNORMAL HIGH (ref 70–99)
Glucose-Capillary: 261 mg/dL — ABNORMAL HIGH (ref 70–99)

## 2022-03-06 LAB — RENAL FUNCTION PANEL
Albumin: 4 g/dL (ref 3.5–5.0)
Anion gap: 13 (ref 5–15)
BUN: 28 mg/dL — ABNORMAL HIGH (ref 6–20)
CO2: 23 mmol/L (ref 22–32)
Calcium: 9.5 mg/dL (ref 8.9–10.3)
Chloride: 99 mmol/L (ref 98–111)
Creatinine, Ser: 1.18 mg/dL (ref 0.61–1.24)
GFR, Estimated: >60 mL/min (ref >60)
Glucose, Bld: 276 mg/dL — ABNORMAL HIGH (ref 70–99)
Phosphorus: 5 mg/dL — ABNORMAL HIGH (ref 2.5–4.6)
Potassium: 4.5 mmol/L (ref 3.5–5.1)
Sodium: 135 mmol/L (ref 135–145)

## 2022-03-06 LAB — CBC
HCT: 46.3 % (ref 39.0–52.0)
Hemoglobin: 14.9 g/dL (ref 13.0–17.0)
MCH: 28.5 pg (ref 26.0–34.0)
MCHC: 32.2 g/dL (ref 30.0–36.0)
MCV: 88.5 fL (ref 80.0–100.0)
Platelets: 297 10*3/uL (ref 150–400)
RBC: 5.23 MIL/uL (ref 4.22–5.81)
RDW: 13.1 % (ref 11.5–15.5)
WBC: 9.8 10*3/uL (ref 4.0–10.5)
nRBC: 0 % (ref 0.0–0.2)

## 2022-03-06 LAB — MAGNESIUM: Magnesium: 2.5 mg/dL — ABNORMAL HIGH (ref 1.7–2.4)

## 2022-03-06 SURGERY — RIGHT HEART CATH

## 2022-03-06 MED ORDER — ACETAMINOPHEN 325 MG PO TABS: 650.0000 mg | ORAL_TABLET | ORAL | Status: DC | PRN

## 2022-03-06 MED ORDER — SODIUM CHLORIDE 0.9% FLUSH
3.0000 mL | Freq: Two times a day (BID) | INTRAVENOUS | Status: DC
  Administered 2022-03-07 – 2022-03-08 (×3): 3 mL via INTRAVENOUS

## 2022-03-06 MED ORDER — HYDRALAZINE HCL 20 MG/ML IJ SOLN: 10.0000 mg | INTRAMUSCULAR | Status: AC | PRN

## 2022-03-06 MED ORDER — LIDOCAINE HCL (PF) 1 % IJ SOLN
INTRAMUSCULAR | Status: DC | PRN
  Administered 2022-03-06: 2 mL

## 2022-03-06 MED ORDER — SODIUM CHLORIDE 0.9 % IV SOLN: INTRAVENOUS | Status: DC

## 2022-03-06 MED ORDER — FENTANYL CITRATE (PF) 100 MCG/2ML IJ SOLN
INTRAMUSCULAR | Status: AC
  Filled 2022-03-06: qty 2

## 2022-03-06 MED ORDER — LABETALOL HCL 5 MG/ML IV SOLN: 10.0000 mg | INTRAVENOUS | Status: AC | PRN

## 2022-03-06 MED ORDER — LIDOCAINE HCL (PF) 1 % IJ SOLN
INTRAMUSCULAR | Status: AC
  Filled 2022-03-06: qty 30

## 2022-03-06 MED ORDER — HEPARIN (PORCINE) IN NACL 1000-0.9 UT/500ML-% IV SOLN
INTRAVENOUS | Status: DC | PRN
  Administered 2022-03-06: 500 mL

## 2022-03-06 MED ORDER — SODIUM CHLORIDE 0.9 % IV SOLN
250.0000 mL | INTRAVENOUS | Status: DC | PRN
Start: 2022-03-06 — End: 2022-03-08

## 2022-03-06 MED ORDER — SODIUM CHLORIDE 0.9 % IV SOLN: 250.0000 mL | INTRAVENOUS | Status: DC | PRN

## 2022-03-06 MED ORDER — SODIUM CHLORIDE 0.9% FLUSH: 3.0000 mL | INTRAVENOUS | Status: DC | PRN

## 2022-03-06 MED ORDER — SODIUM CHLORIDE 0.9% FLUSH
3.0000 mL | INTRAVENOUS | Status: DC | PRN
Start: 2022-03-06 — End: 2022-03-06

## 2022-03-06 MED ORDER — SODIUM CHLORIDE 0.9% FLUSH
3.0000 mL | Freq: Two times a day (BID) | INTRAVENOUS | Status: DC
  Administered 2022-03-06: 3 mL via INTRAVENOUS

## 2022-03-06 MED ORDER — MIDAZOLAM HCL 2 MG/2ML IJ SOLN
INTRAMUSCULAR | Status: AC
  Filled 2022-03-06: qty 2

## 2022-03-06 MED ORDER — ONDANSETRON HCL 4 MG/2ML IJ SOLN: 4.0000 mg | Freq: Four times a day (QID) | INTRAMUSCULAR | Status: DC | PRN

## 2022-03-06 MED ORDER — FENTANYL CITRATE (PF) 100 MCG/2ML IJ SOLN
INTRAMUSCULAR | Status: DC | PRN
  Administered 2022-03-06: 25 ug via INTRAVENOUS

## 2022-03-06 MED ORDER — MIDAZOLAM HCL 2 MG/2ML IJ SOLN
INTRAMUSCULAR | Status: DC | PRN
  Administered 2022-03-06: 1 mg via INTRAVENOUS

## 2022-03-06 SURGICAL SUPPLY — 10 items
CATH BALLN WEDGE 5F 110CM (CATHETERS) IMPLANT
PROTECTION STATION PRESSURIZED (MISCELLANEOUS) ×1
SHEATH PINNACLE 5F 10CM (SHEATH) IMPLANT
SHEATH PROBE COVER 6X72 (BAG) IMPLANT
STATION PROTECTION PRESSURIZED (MISCELLANEOUS) IMPLANT
TRANSDUCER W/STOPCOCK (MISCELLANEOUS) IMPLANT
TUBING ART PRESS 72  MALE/FEM (TUBING) ×1
TUBING ART PRESS 72 MALE/FEM (TUBING) IMPLANT
WIRE MICRO SET 5FR 12 (WIRE) IMPLANT
WIRE MICRO SET SILHO 5FR 7 (SHEATH) IMPLANT

## 2022-03-06 NOTE — Progress Notes (Signed)
Pt on cpap for the night with 3L O2 bled in sats 100%. Pt seems to be tolerating it well at this time and is resting comfortably.

## 2022-03-06 NOTE — Progress Notes (Signed)
Heart Failure Stewardship Pharmacist Progress Note   PCP: Margot Ables, MD (Inactive) PCP-Cardiologist: Bryan Lemma, MD    HPI:  51 yo M with PMH of HTN, T2DM, HLD, HFrEF since ~2000, polysubstance use, OSA on CPAP, and PPM.   He presented to the ED on 8/31 with chest pain, shortness of breath, and LE edema. He reports he took 320 mg of lasix without any improvement. CXR with mild vascular congestion without pulmonary edema. He states he got out of drug and alcohol rehab 3 days PTA, but UDS was positive for cocaine on admission. ECHO on 9/1 showed LVEF 25%, RV mildly reduced. Plan for RHC on 9/8.  Current HF Medications: Diuretic: furosemide 80 mg PO daily Beta Blocker: carvedilol 12.5 mg BID ACE/ARB/ARNI: Entresto 24/26 mg BID MRA: spironolactone 25 mg daily SGLT2i: Farxiga 10 mg daily  Prior to admission HF Medications: Diuretic: furosemide 80 mg daily Beta blocker: carvedilol 6.25 mg BID *metoprolol XL also on list but not reporting as taking  Pertinent Lab Values: Serum creatinine 1.18, BUN 28, Potassium 4.5, Sodium 135, BNP 136.5, Magnesium 2.5, A1c 9.1  Vital Signs: Weight: 291 lbs (admission weight: 300 lbs) Blood pressure: 120/70s  Heart rate: 80s (v-paced)  I/O: -1.5L yesterday; net -18L  Medication Assistance / Insurance Benefits Check: Does the patient have prescription insurance?  Yes Type of insurance plan: [place] Medicaid  Outpatient Pharmacy:  Prior to admission outpatient pharmacy: [facility] Is the patient willing to use [facility] TOC pharmacy at discharge? Yes Is the patient willing to transition their outpatient pharmacy to utilize a [facility] outpatient pharmacy?   Pending    Assessment: 1. Acute on chronic systolic CHF (LVEF 25%), due to NICM. NYHA class II symptoms. - Continue furosemide 80 mg PO daily. Strict I/Os. Daily weights. Keep K>4 and Mag>2.   - Continue carvedilol 12.5 mg BID - Continue Entresto 24/26 mg BID - reported cough  with lisinopril, monitor for side effects  - Continue spironolactone 25 mg daily - Continue Farxiga 10 mg daily   Plan: 1) Medication changes recommended at this time: - None pending RHC today - Remove metoprolol XL from med list on discharge  2) Patient assistance: - Has  Medicaid - Entresto copay $0 - Farxiga copay $0  3)  Education  - Patient has been educated on current HF medications and potential additions to HF medication regimen - Patient verbalizes understanding that over the next few months, these medication doses may change and more medications may be added to optimize HF regimen - Patient has been educated on basic disease state pathophysiology and goals of therapy   Sharen Hones, PharmD, BCPS Heart Failure Engineer, building services Phone [phone]

## 2022-03-06 NOTE — Progress Notes (Signed)
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  BNP 136.  Chest x-ray consistent with CHF.  Urine is positive for cocaine.  TTE ordered.  Patient was started on IV Lasix.   TTE with LVEF of 25% (30 to 35% in 2022)., GH but not able to assess RWMA, diastolic function.  Cardiology following.  Remains on IV Lasix.  Plan for RHC on 9/8.  Assessment and Plan: Acute on chronic combined CHF: TTE with LVEF of 25%.  (LVEF of 30 to 35%, GH, G2-DD in 8/22).   - Weight down, creatinine stable (improved today), appearing nearly euvolemic by exam today, converted IV > PO lasix 9/8.  - RHC 9/8. Appreciate cardiology recommendations.  -GDMT: Farxiga, Coreg, Aldactone and Entresto -Strict I and O, daily weight, renal functions and electrolytes -Encouraged to stop cocaine use.   History of ventricular tachycardia/PPM: PPM approaching end of battery life. - Need generator change by primary EP cardiology at [facility] upon discharge.   Cocaine abuse: UDS positive but he denies use stating that he was around someone who was using cocaine. - Advised to refrain from cocaine.   Chronic respiratory failure with hypoxia: Stable on home 2.5 L -Continue home oxygen and CPAP -Manage CHF as above   Obstructive sleep apnea - Continue CPAP at night.   Hypertension: Normotensive for most part. - Cardiac meds as above.   IDDM-2 with hyperglycemia and HLD: Seems he is on Lantus, NovoLog and Victoza at home.  A1c 9.1%. -Continue basal-bolus insulin. -Continue home Crestor.   Mood disorder: Stable. -Continue home meds.   Hypokalemia/hyperphosphatemia:  Hypokalemia resolved.  P 4.8. - Monitor replenish as appropriate   Productive cough: WBC, PCT, temperature are normal.  He denies smoking.  Has history of ACE inhibitor intolerance.  - Improving with CHF management.   Left ear pain: small tissue growth with small pus looking tip over posterior wall of external ear canal on otoscopic exam.  Improved. - Continue home antibiotic drop   Euthyroid sick syndrome: TSH low but free T4 normal. - Repeat TFT in 4 to 6 weeks.   Generalized weakness/physical deconditioning -HH PT/aide/rollator ordered.  Morbid obesity: Estimated body mass index is 47.69 kg/m as calculated from the following:   Height as of this encounter: 5' 5.5" (1.664 m).   Weight as of this encounter: 132 kg.   Subjective: Breathing better, able to walk farther and lay back in bed more easily without coughing. Left ear still bothering him.  Objective: Vitals:   03/06/22 0009 03/06/22 0500 03/06/22 0741 03/06/22 1225  BP:   119/73 129/77  Pulse: 81 95 88 82  Resp: 15 19 20 20   Temp:  98.1 F (36.7 C) 97.8 F (36.6 C)   TempSrc:  Oral Oral   SpO2: 100% (!) 89% 97% 97%  Weight:  132 kg    Height:       Gen: Obese, pleasant 51 y.o. male in no distress HEENT: Left ear canal without discharge or odor. Small supero-posterior  mildly erythematous growth that is not obstructive without active discharge.  Pulm: Nonlabored breathing supplemental oxygen. Diminished/distant without crackles or wheezing CV: Regular rate  and rhythm. No murmur, rub, or gallop. No definite JVD, trace dependent edema. GI: Abdomen soft, non-tender, non-distended, with normoactive bowel sounds.  Ext: Warm, no deformities Skin: No rashes, lesions or ulcers on visualized skin. Neuro: Alert and oriented. No focal neurological deficits. Psych: Judgement and insight appear fair. Mood euthymic & affect congruent. Behavior is appropriate.    Data Personally reviewed: CBC: Recent Labs  Lab 03/01/22 0234  03/02/22 0225 03/03/22 0228 03/05/22 0343 03/06/22 0540  WBC 7.9 8.1 9.0 11.3* 9.8  HGB 13.5 13.1 13.8 14.5 14.9  HCT 41.6 40.3 40.9 42.9 46.3  MCV 88.9 87.6 87.0 86.1 88.5  PLT 278 293 268 286 297   Basic Metabolic Panel: Recent Labs  Lab 03/01/22 0234 03/02/22 0225 03/03/22 0228 03/04/22 0926 03/05/22 0343 03/06/22 0540  NA 138 139 136 134* 136 135  K 3.9 3.8 3.8 4.5 3.9 4.5  CL 101 102 101 100 101 99  CO2 28 27 26 24 26 23   GLUCOSE 187* 190* 196* 248* 225* 276*  BUN 17 23* 25* 22* 29* 28*  CREATININE 0.98 1.21 1.08 1.14 1.26* 1.18  CALCIUM 9.2 9.0 9.0 9.1 9.1 9.5  MG 2.3 2.2 2.4 2.3 2.4 2.5*  PHOS 5.2* 5.4* 5.3*  --  4.8* 5.0*   GFR: Estimated Creatinine Clearance: 92.6 mL/min (by C-G formula based on SCr of 1.18 mg/dL). Liver Function Tests: Recent Labs  Lab 03/01/22 0234 03/02/22 0225 03/03/22 0228 03/05/22 0343 03/06/22 0540  ALBUMIN 3.5 3.5 3.7 3.8 4.0   No results for input(s): "LIPASE", "AMYLASE" in the last 168 hours. No results for input(s): "AMMONIA" in the last 168 hours. Coagulation Profile: No results for input(s): "INR", "PROTIME" in the last 168 hours. Cardiac Enzymes: No results for input(s): "CKTOTAL", "CKMB", "CKMBINDEX", "TROPONINI" in the last 168 hours. BNP (last 3 results) No results for input(s): "PROBNP" in the last 8760 hours. HbA1C: No results for input(s): "HGBA1C" in the last 72 hours. CBG: Recent Labs  Lab 03/05/22 1118 03/05/22 1547 03/05/22 2043 03/06/22 0555 03/06/22 1054  GLUCAP 204* 187* 238* 260* 133*   Lipid Profile: No results for input(s): "CHOL", "HDL", "LDLCALC", "TRIG", "CHOLHDL", "LDLDIRECT" in the last 72 hours. Thyroid Function Tests: No results for input(s): "TSH", "T4TOTAL", "FREET4", "T3FREE", "THYROIDAB" in the last 72 hours. Anemia Panel: No results for input(s): "VITAMINB12", "FOLATE", "FERRITIN", "TIBC", "IRON", "RETICCTPCT" in the last 72 hours. Urine analysis:    Component Value Date/Time    COLORURINE YELLOW 08/05/2021 1429   APPEARANCEUR CLEAR 08/05/2021 1429   LABSPEC 1.030 08/05/2021 1429   PHURINE 5.0 08/05/2021 1429   GLUCOSEU >=500 (A) 08/05/2021 1429   HGBUR SMALL (A) 08/05/2021 1429   BILIRUBINUR NEGATIVE 08/05/2021 1429   KETONESUR NEGATIVE 08/05/2021 1429   PROTEINUR NEGATIVE 08/05/2021 1429   NITRITE NEGATIVE 08/05/2021 1429   LEUKOCYTESUR NEGATIVE 08/05/2021 1429    Disposition: Status is: Inpatient Remains inpatient appropriate because: RHC today, timing of discharge per cardiology.  Planned Discharge Destination: Home  10/03/2021, MD 03/06/2022 2:01 PM Page by [email or web address]

## 2022-03-06 NOTE — Progress Notes (Signed)
Rounding Note    Patient Name: Luis [name] Date of Encounter: 03/06/2022  [facility] Cardiologist: Bryan Lemma, MD   Subjective   Feels better today. Cough and SOB improved. Was able to ambulate further when walking.  Cr improved to 1.18 today Net negative 2.16L Wt 291>291lbs   Inpatient Medications    Scheduled Meds:  carvedilol  12.5 mg Oral BID WC   clopidogrel  75 mg Oral Daily   dapagliflozin propanediol  10 mg Oral Daily   DULoxetine  90 mg Oral Daily   furosemide  80 mg Oral Daily   heparin  5,000 Units Subcutaneous Q8H   insulin aspart  0-20 Units Subcutaneous TID WC   insulin aspart  0-5 Units Subcutaneous QHS   insulin aspart  8 Units Subcutaneous TID WC   insulin glargine-yfgn  20 Units Subcutaneous BID   neomycin-polymyxin-hydrocortisone  4 drop Left EAR QID   pantoprazole  40 mg Oral Daily   prazosin  2 mg Oral QHS   rosuvastatin  20 mg Oral Daily   sacubitril-valsartan  1 tablet Oral BID   sodium chloride flush  3 mL Intravenous Q12H   sodium chloride flush  3 mL Intravenous Q12H   spironolactone  25 mg Oral Daily   traZODone  200 mg Oral QHS   Continuous Infusions:  sodium chloride     sodium chloride     sodium chloride     PRN Meds: sodium chloride, sodium chloride, acetaminophen, benzonatate, hydrOXYzine, ipratropium-albuterol, labetalol, ondansetron (ZOFRAN) IV, sodium chloride flush, sodium chloride flush   Vital Signs    Vitals:   03/05/22 2107 03/06/22 0009 03/06/22 0500 03/06/22 0741  BP: 137/75   119/73  Pulse: 89 81 95 88  Resp: 18 15 19 20   Temp:   98.1 F (36.7 C) 97.8 F (36.6 C)  TempSrc:   Oral Oral  SpO2: 96% 100% (!) 89% 97%  Weight:   132 kg   Height:        Intake/Output Summary (Last 24 hours) at 03/06/2022 0855 Last data filed at 03/06/2022 0500 Gross per 24 hour  Intake 240 ml  Output 2400 ml  Net -2160 ml       03/06/2022    5:00 AM 03/05/2022    2:35 PM 03/04/2022    6:25 AM  Last 3  Weights  Weight (lbs) 291 lb 291 lb 3.6 oz 290 lb 9.6 oz  Weight (kg) 131.997 kg 132.1 kg 131.815 kg      Telemetry    A-sensed, v-paced - Personally Reviewed  ECG    No new tracing - Personally Reviewed  Physical Exam   GEN: Sitting up in bed, comfortable Neck: JVD difficult to assess due to body habitus Cardiac: RRR, no murmurs Respiratory: CTAB GI: Obese, soft, NTTP MS: Trace edema, warm Neuro:  Nonfocal  Psych: Normal affect   Labs    High Sensitivity Troponin:   Recent Labs  Lab 02/26/22 0739 02/27/22 2012 02/27/22 2256 02/28/22 1859 02/28/22 2030  TROPONINIHS 12 19* 20* 14 15      Chemistry Recent Labs  Lab 03/03/22 0228 03/04/22 0926 03/05/22 0343 03/06/22 0540  NA 136 134* 136 135  K 3.8 4.5 3.9 4.5  CL 101 100 101 99  CO2 26 24 26 23   GLUCOSE 196* 248* 225* 276*  BUN 25* 22* 29* 28*  CREATININE 1.08 1.14 1.26* 1.18  CALCIUM 9.0 9.1 9.1 9.5  MG 2.4 2.3 2.4 2.5*  ALBUMIN 3.7  --  3.8 4.0  GFRNONAA >60 >60 >60 >60  ANIONGAP 9 10 9 13      Lipids No results for input(s): "CHOL", "TRIG", "HDL", "LABVLDL", "LDLCALC", "CHOLHDL" in the last 168 hours.  Hematology Recent Labs  Lab 03/03/22 0228 03/05/22 0343 03/06/22 0540  WBC 9.0 11.3* 9.8  RBC 4.70 4.98 5.23  HGB 13.8 14.5 14.9  HCT 40.9 42.9 46.3  MCV 87.0 86.1 88.5  MCH 29.4 29.1 28.5  MCHC 33.7 33.8 32.2  RDW 12.9 13.1 13.1  PLT 268 286 297    Thyroid  Recent Labs  Lab 03/01/22 0918  TSH 0.117*  FREET4 1.11     BNP Recent Labs  Lab 02/28/22 0308 03/03/22 0228  BNP 87.4 62.2     DDimer No results for input(s): "DDIMER" in the last 168 hours.   Radiology    No results found.  Cardiac Studies   TTE 02/27/2022  1. Left ventricular ejection fraction, by estimation, is 25%. The left  ventricle has severely decreased function. The left ventricle demonstrates  global hypokinesis. Cannot comment on regional wall motion abnormalities  and Definity contrast was not   attempted. Consider repeat limited echo for RWM assessment if clinically  indicated. The left ventricular internal cavity size was mildly dilated.  Left ventricular diastolic parameters are indeterminate.   2. Right ventricular systolic function is mildly reduced. The right  ventricular size is normal. Tricuspid regurgitation signal is inadequate  for assessing PA pressure.   3. The mitral valve is grossly normal. Trivial mitral valve  regurgitation. No evidence of mitral stenosis.   4. The aortic valve is grossly normal. There is mild calcification of the  aortic valve. Aortic valve regurgitation is not visualized. No aortic  stenosis is present.   5. The inferior vena cava is normal in size with greater than 50%  respiratory variability, suggesting right atrial pressure of 3 mmHg.   Patient Profile     51 y.o. male with history of nonischemic cardiomyopathy, chronic systolic heart failure status post BiV ICD, chronic respiratory failure on 2 L nasal cannula, OSA, diabetes, cocaine abuse who was admitted on 02/26/2022 for acute on chronic systolic heart failure.  Assessment & Plan    #Acute on Chronic Systolic HF, EF 25%: #Non-ischemic CM: Patient presented with worsening chest pain and SOB found to be overloaded on exam with pulmonary edema on CXR consistent with acute on chronic systolic heart failure. Has been diuresing well. Volume status difficult to assess given body habitus but suspect he is near euvolemia and has been transitioned to lasix 80mg  PO daily with improvement in Cr. Plan for RHC today. -Plan for RHC today -Continue lasix 80mg  PO daily -Continue coreg 12.5mg  BID  -Continue entresto 24/26mg  BID -Continue spiro 25mg  daily -Continue farxiga 10mg  daily -Discussed importance of cocaine cessation -ICD is approaching end-of-life and will need generator change to be performed by [facility]  #Sinus Tachycardia: Improved with increase in coreg -Continue coreg 12.5mg  BID;  up-titrate as tolerated  #Chronic Respiratory Failure with Hypoxia: -Continue home O2 and CPAP  #OSA: -Continue CPAP  #DMII: -Management per IM  #Morbid Obesity: BMI 48.5.>47.7      For questions or updates, please contact [facility] Please consult [email or web address] for contact info under        Signed, 02/28/2022, MD  03/06/2022, 8:55 AM

## 2022-03-06 NOTE — Progress Notes (Signed)
   03/06/22 1100  Mobility  Activity Ambulated with assistance in hallway  Level of Assistance Modified independent, requires aide device or extra time  Assistive Device Four wheel walker  Distance Ambulated (ft) 500 ft  Activity Response Tolerated well  $Mobility charge 1 Mobility   Mobility Specialist Progress Note   During Mobility: 98%SpO2 (3L)   Pt was in chair and agreeable. X2 standing breaks d/t fatigue. Returned to chair w/ all needs met and call bell in reach.    Luis [name] Mobility Specialist

## 2022-03-06 NOTE — Interval H&P Note (Signed)
History and Physical Interval Note:  03/06/2022 2:34 PM  Luis [name]  has presented today for surgery, with the diagnosis of hf.  The various methods of treatment have been discussed with the patient and family. After consideration of risks, benefits and other options for treatment, the patient has consented to  Procedure(s): RIGHT HEART CATH (N/A) as a surgical intervention.  The patient's history has been reviewed, patient examined, no change in status, stable for surgery.  I have reviewed the patient's chart and labs.  Questions were answered to the patient's satisfaction.     Orbie Pyo

## 2022-03-06 NOTE — Progress Notes (Signed)
Patient will place himself on CPAP will he is ready for bed.  RT will continue to monitor.

## 2022-03-06 NOTE — H&P (View-Only) (Signed)
 Rounding Note    Patient Name: Luis [name] Date of Encounter: 03/06/2022   [facility] Cardiologist: David Harding, MD   Subjective   Feels better today. Cough and SOB improved. Was able to ambulate further when walking.  Cr improved to 1.18 today Net negative 2.16L Wt 291>291lbs   Inpatient Medications    Scheduled Meds:  carvedilol  12.5 mg Oral BID WC   clopidogrel  75 mg Oral Daily   dapagliflozin propanediol  10 mg Oral Daily   DULoxetine  90 mg Oral Daily   furosemide  80 mg Oral Daily   heparin  5,000 Units Subcutaneous Q8H   insulin aspart  0-20 Units Subcutaneous TID WC   insulin aspart  0-5 Units Subcutaneous QHS   insulin aspart  8 Units Subcutaneous TID WC   insulin glargine-yfgn  20 Units Subcutaneous BID   neomycin-polymyxin-hydrocortisone  4 drop Left EAR QID   pantoprazole  40 mg Oral Daily   prazosin  2 mg Oral QHS   rosuvastatin  20 mg Oral Daily   sacubitril-valsartan  1 tablet Oral BID   sodium chloride flush  3 mL Intravenous Q12H   sodium chloride flush  3 mL Intravenous Q12H   spironolactone  25 mg Oral Daily   traZODone  200 mg Oral QHS   Continuous Infusions:  sodium chloride     sodium chloride     sodium chloride     PRN Meds: sodium chloride, sodium chloride, acetaminophen, benzonatate, hydrOXYzine, ipratropium-albuterol, labetalol, ondansetron (ZOFRAN) IV, sodium chloride flush, sodium chloride flush   Vital Signs    Vitals:   03/05/22 2107 03/06/22 0009 03/06/22 0500 03/06/22 0741  BP: 137/75   119/73  Pulse: 89 81 95 88  Resp: 18 15 19 20  Temp:   98.1 F (36.7 C) 97.8 F (36.6 C)  TempSrc:   Oral Oral  SpO2: 96% 100% (!) 89% 97%  Weight:   132 kg   Height:        Intake/Output Summary (Last 24 hours) at 03/06/2022 0855 Last data filed at 03/06/2022 0500 Gross per 24 hour  Intake 240 ml  Output 2400 ml  Net -2160 ml       03/06/2022    5:00 AM 03/05/2022    2:35 PM 03/04/2022    6:25 AM  Last 3  Weights  Weight (lbs) 291 lb 291 lb 3.6 oz 290 lb 9.6 oz  Weight (kg) 131.997 kg 132.1 kg 131.815 kg      Telemetry    A-sensed, v-paced - Personally Reviewed  ECG    No new tracing - Personally Reviewed  Physical Exam   GEN: Sitting up in bed, comfortable Neck: JVD difficult to assess due to body habitus Cardiac: RRR, no murmurs Respiratory: CTAB GI: Obese, soft, NTTP MS: Trace edema, warm Neuro:  Nonfocal  Psych: Normal affect   Labs    High Sensitivity Troponin:   Recent Labs  Lab 02/26/22 0739 02/27/22 2012 02/27/22 2256 02/28/22 1859 02/28/22 2030  TROPONINIHS 12 19* 20* 14 15      Chemistry Recent Labs  Lab 03/03/22 0228 03/04/22 0926 03/05/22 0343 03/06/22 0540  NA 136 134* 136 135  K 3.8 4.5 3.9 4.5  CL 101 100 101 99  CO2 26 24 26 23  GLUCOSE 196* 248* 225* 276*  BUN 25* 22* 29* 28*  CREATININE 1.08 1.14 1.26* 1.18  CALCIUM 9.0 9.1 9.1 9.5  MG 2.4 2.3 2.4 2.5*  ALBUMIN 3.7  --    3.8 4.0  GFRNONAA >60 >60 >60 >60  ANIONGAP 9 10 9 13      Lipids No results for input(s): "CHOL", "TRIG", "HDL", "LABVLDL", "LDLCALC", "CHOLHDL" in the last 168 hours.  Hematology Recent Labs  Lab 03/03/22 0228 03/05/22 0343 03/06/22 0540  WBC 9.0 11.3* 9.8  RBC 4.70 4.98 5.23  HGB 13.8 14.5 14.9  HCT 40.9 42.9 46.3  MCV 87.0 86.1 88.5  MCH 29.4 29.1 28.5  MCHC 33.7 33.8 32.2  RDW 12.9 13.1 13.1  PLT 268 286 297    Thyroid  Recent Labs  Lab 03/01/22 0918  TSH 0.117*  FREET4 1.11     BNP Recent Labs  Lab 02/28/22 0308 03/03/22 0228  BNP 87.4 62.2     DDimer No results for input(s): "DDIMER" in the last 168 hours.   Radiology    No results found.  Cardiac Studies   TTE 02/27/2022  1. Left ventricular ejection fraction, by estimation, is 25%. The left  ventricle has severely decreased function. The left ventricle demonstrates  global hypokinesis. Cannot comment on regional wall motion abnormalities  and Definity contrast was not   attempted. Consider repeat limited echo for RWM assessment if clinically  indicated. The left ventricular internal cavity size was mildly dilated.  Left ventricular diastolic parameters are indeterminate.   2. Right ventricular systolic function is mildly reduced. The right  ventricular size is normal. Tricuspid regurgitation signal is inadequate  for assessing PA pressure.   3. The mitral valve is grossly normal. Trivial mitral valve  regurgitation. No evidence of mitral stenosis.   4. The aortic valve is grossly normal. There is mild calcification of the  aortic valve. Aortic valve regurgitation is not visualized. No aortic  stenosis is present.   5. The inferior vena cava is normal in size with greater than 50%  respiratory variability, suggesting right atrial pressure of 3 mmHg.   Patient Profile     51 y.o. male with history of nonischemic cardiomyopathy, chronic systolic heart failure status post BiV ICD, chronic respiratory failure on 2 L nasal cannula, OSA, diabetes, cocaine abuse who was admitted on 02/26/2022 for acute on chronic systolic heart failure.  Assessment & Plan    #Acute on Chronic Systolic HF, EF 25%: #Non-ischemic CM: Patient presented with worsening chest pain and SOB found to be overloaded on exam with pulmonary edema on CXR consistent with acute on chronic systolic heart failure. Has been diuresing well. Volume status difficult to assess given body habitus but suspect he is near euvolemia and has been transitioned to lasix 80mg  PO daily with improvement in Cr. Plan for RHC today. -Plan for RHC today -Continue lasix 80mg  PO daily -Continue coreg 12.5mg  BID  -Continue entresto 24/26mg  BID -Continue spiro 25mg  daily -Continue farxiga 10mg  daily -Discussed importance of cocaine cessation -ICD is approaching end-of-life and will need generator change to be performed by [facility]  #Sinus Tachycardia: Improved with increase in coreg -Continue coreg 12.5mg  BID;  up-titrate as tolerated  #Chronic Respiratory Failure with Hypoxia: -Continue home O2 and CPAP  #OSA: -Continue CPAP  #DMII: -Management per IM  #Morbid Obesity: BMI 48.5.>47.7      For questions or updates, please contact [facility] Please consult [email or web address] for contact info under        Signed, 02/28/2022, MD  03/06/2022, 8:55 AM

## 2022-03-07 ENCOUNTER — Inpatient Hospital Stay (HOSPITAL_COMMUNITY): Payer: Medicare Other

## 2022-03-07 DIAGNOSIS — M7989 Other specified soft tissue disorders: Secondary | ICD-10-CM | POA: Diagnosis not present

## 2022-03-07 DIAGNOSIS — L538 Other specified erythematous conditions: Secondary | ICD-10-CM | POA: Diagnosis not present

## 2022-03-07 DIAGNOSIS — R52 Pain, unspecified: Secondary | ICD-10-CM | POA: Diagnosis not present

## 2022-03-07 DIAGNOSIS — F1410 Cocaine abuse, uncomplicated: Secondary | ICD-10-CM | POA: Diagnosis not present

## 2022-03-07 DIAGNOSIS — I5043 Acute on chronic combined systolic (congestive) and diastolic (congestive) heart failure: Secondary | ICD-10-CM | POA: Diagnosis not present

## 2022-03-07 DIAGNOSIS — E119 Type 2 diabetes mellitus without complications: Secondary | ICD-10-CM | POA: Diagnosis not present

## 2022-03-07 DIAGNOSIS — J9611 Chronic respiratory failure with hypoxia: Secondary | ICD-10-CM | POA: Diagnosis not present

## 2022-03-07 LAB — GLUCOSE, CAPILLARY
Glucose-Capillary: 187 mg/dL — ABNORMAL HIGH (ref 70–99)
Glucose-Capillary: 174 mg/dL — ABNORMAL HIGH (ref 70–99)
Glucose-Capillary: 171 mg/dL — ABNORMAL HIGH (ref 70–99)
Glucose-Capillary: 188 mg/dL — ABNORMAL HIGH (ref 70–99)

## 2022-03-07 LAB — BASIC METABOLIC PANEL
Anion gap: 12 (ref 5–15)
BUN: 23 mg/dL — ABNORMAL HIGH (ref 6–20)
CO2: 24 mmol/L (ref 22–32)
Calcium: 9 mg/dL (ref 8.9–10.3)
Chloride: 99 mmol/L (ref 98–111)
Creatinine, Ser: 1.03 mg/dL (ref 0.61–1.24)
GFR, Estimated: >60 mL/min (ref >60)
Glucose, Bld: 163 mg/dL — ABNORMAL HIGH (ref 70–99)
Potassium: 4.1 mmol/L (ref 3.5–5.1)
Sodium: 135 mmol/L (ref 135–145)

## 2022-03-07 MED ORDER — OXYCODONE HCL 5 MG PO TABS
5.0000 mg | ORAL_TABLET | Freq: Once | ORAL | Status: AC
  Administered 2022-03-07: 5 mg via ORAL
  Filled 2022-03-07: qty 1

## 2022-03-07 MED ORDER — OXYCODONE HCL 5 MG PO TABS
5.0000 mg | ORAL_TABLET | ORAL | Status: DC | PRN
  Administered 2022-03-07 – 2022-03-08 (×3): 5 mg via ORAL
  Filled 2022-03-07 (×3): qty 1

## 2022-03-07 NOTE — Progress Notes (Signed)
VASCULAR LAB    Right upper extremity duplex to rule out hematoma has been performed.  See CV proc for preliminary results.   Saskia Simerson, RVT 03/07/2022, 12:56 PM

## 2022-03-07 NOTE — Progress Notes (Signed)
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  BNP 136.  Chest x-ray consistent with CHF.  Urine is positive for cocaine.  TTE ordered.  Patient was started on IV Lasix.   TTE with LVEF of 25% (30 to 35% in 2022)., GH but not able to assess RWMA, diastolic function.  Cardiology following.  Remains on IV Lasix.  Plan for RHC on 9/8.  Assessment and Plan: Acute on chronic combined CHF: TTE with LVEF of 25%.  (LVEF of 30 to 35%, GH, G2-DD in 8/22).   - Weight down, creatinine stable (improved again today), appearing nearly euvolemic by exam today, converted IV > PO lasix 9/8. Will continue this at discharge. - RHC 9/8 showed mildly elevated pressures and mildly diminished cardiac output (CO 4.28, CI 1.82). Mean RA 8, PCWP 13, PA mean 22.  -GDMT: Farxiga, Coreg, Aldactone and Entresto. Pt insured. -Strict I and O, daily weight, renal functions and electrolytes -Encouraged to stop cocaine use.   History of ventricular tachycardia/PPM: PPM approaching end of battery life. - Need generator change by primary EP cardiology at [facility] [place] upon discharge.   Cocaine abuse: UDS positive but he denies use stating that he was around someone who was using cocaine. - Advised to refrain from cocaine.   Chronic respiratory failure with hypoxia: Stable on home 2.5 L -Continue home oxygen and CPAP -Manage CHF as above   Obstructive sleep apnea - Continue CPAP at night.   Hypertension: Normotensive for most part. - Cardiac meds as above.   IDDM-2 with hyperglycemia and HLD: Seems he is on Lantus, NovoLog and Victoza at home.  A1c 9.1%. -Continue  basal-bolus insulin. -Continue home Crestor.   Mood disorder: Stable. -Continue home meds.   Hypokalemia/hyperphosphatemia: Hypokalemia resolved.  P 4.8. - Monitor replenish as appropriate   Productive cough: WBC, PCT, temperature are normal.  He denies smoking.  Has history of ACE inhibitor intolerance.  - Improving with CHF management.   Left ear pain: small tissue growth with small pus looking tip over posterior wall of external ear canal on otoscopic exam.  Improved. - Continue home antibiotic drop. Suggest ENT follow up.   Euthyroid sick syndrome: TSH low but free T4 normal. - Repeat TFT in 4 to 6 weeks.   Generalized weakness/physical deconditioning -HH PT/aide/rollator ordered.  Right neck tender induration at site of right IJ access for RHC 9/8.  - U/S ordered per cardiology - Analgesia as ordered.  Morbid obesity: Estimated body mass index is 47.73 kg/m as calculated from the following:   Height as of this encounter: 5' 5.5" (1.664 m).   Weight as of this encounter: 132.1 kg.   Subjective: Breathing better, better exertional capacity walked several times yesterday. Cough decreased. Main issue is right neck is quite tender.   Objective: Vitals:   03/06/22 1707 03/06/22 2039 03/07/22 0544 03/07/22 1152  BP: (!) 142/88 126/67 128/78 123/66  Pulse: 83 90 92 75  Resp: 19 20 20 20   Temp:  97.8 F (36.6 C) 97.9 F (36.6 C) 97.8 F (36.6 C)  TempSrc:  Oral Oral Oral  SpO2: 97% 97% 94% 99%  Weight:  132.1 kg   Height:       Gen: 51 y.o. male in no distress HEENT: Otoscopic exam of left ear confirms supero-posterior (1-2 o'clock), small estimated ~92mm skin-colored papule with dried blood on lateral aspect. No erythema or discharge. TM normal. Pulm: Nonlabored breathing room air. Clear. CV: Regular rate and rhythm. No murmur, rub, or gallop. No JVD, minimal dependent edema. GI: Abdomen soft, non-tender, non-distended, with normoactive bowel sounds.  Ext: Warm, no  deformities Skin: Right IJ site indurated and tender with focal swelling. No ecchymosis or discharge. No other rashes, lesions or ulcers on visualized skin. Neuro: Alert and oriented. No focal neurological deficits. Psych: Judgement and insight appear fair. Mood euthymic & affect congruent. Behavior is appropriate.    Data Personally reviewed: CBC: Recent Labs  Lab 03/01/22 0234 03/02/22 0225 03/03/22 0228 03/05/22 0343 03/06/22 0540  WBC 7.9 8.1 9.0 11.3* 9.8  HGB 13.5 13.1 13.8 14.5 14.9  HCT 41.6 40.3 40.9 42.9 46.3  MCV 88.9 87.6 87.0 86.1 88.5  PLT 278 293 268 286 297   Basic Metabolic Panel: Recent Labs  Lab 03/01/22 0234 03/02/22 0225 03/03/22 0228 03/04/22 0926 03/05/22 0343 03/06/22 0540 03/07/22 0333  NA 138 139 136 134* 136 135 135  K 3.9 3.8 3.8 4.5 3.9 4.5 4.1  CL 101 102 101 100 101 99 99  CO2 28 27 26 24 26 23 24   GLUCOSE 187* 190* 196* 248* 225* 276* 163*  BUN 17 23* 25* 22* 29* 28* 23*  CREATININE 0.98 1.21 1.08 1.14 1.26* 1.18 1.03  CALCIUM 9.2 9.0 9.0 9.1 9.1 9.5 9.0  MG 2.3 2.2 2.4 2.3 2.4 2.5*  --   PHOS 5.2* 5.4* 5.3*  --  4.8* 5.0*  --    GFR: Estimated Creatinine Clearance: 106.2 mL/min (by C-G formula based on SCr of 1.03 mg/dL). Liver Function Tests: Recent Labs  Lab 03/01/22 0234 03/02/22 0225 03/03/22 0228 03/05/22 0343 03/06/22 0540  ALBUMIN 3.5 3.5 3.7 3.8 4.0   CBG: Recent Labs  Lab 03/06/22 1054 03/06/22 1602 03/06/22 2119 03/07/22 0604 03/07/22 1149  GLUCAP 133* 112* 261* 187* 174*   Urine analysis:    Component Value Date/Time   COLORURINE YELLOW 08/05/2021 1429   APPEARANCEUR CLEAR 08/05/2021 1429   LABSPEC 1.030 08/05/2021 1429   PHURINE 5.0 08/05/2021 1429   GLUCOSEU >=500 (A) 08/05/2021 1429   HGBUR SMALL (A) 08/05/2021 1429   BILIRUBINUR NEGATIVE 08/05/2021 1429   KETONESUR NEGATIVE 08/05/2021 1429   PROTEINUR NEGATIVE 08/05/2021 1429   NITRITE NEGATIVE 08/05/2021 1429   LEUKOCYTESUR NEGATIVE 08/05/2021  1429    Disposition: Status is: Inpatient Remains inpatient appropriate because: Planning 9/10 discharge per cardiology.  Planned Discharge Destination: Home  11/10, MD 03/07/2022 1:37 PM Page by [email or web address]

## 2022-03-07 NOTE — Progress Notes (Signed)
RT checked back in on Pt and Pt currently has cpap on and seems to be resting comfortably at this time, with 4L O2 bled in sats 96%.

## 2022-03-07 NOTE — Progress Notes (Signed)
Occupational Therapy Treatment Patient Details Name: OSWELL SAY MRN: [id number] DOB: 13-Mar-1971 Today's Date: 03/07/2022   History of present illness Pt is a 51 y/o male admitted secondary to SOB. Thought to be secondary to CHF. PMH includes pacemaker, DM, tobacco use, HTN and substance abuse.   OT comments  Pt. Seen for skilled OT treatment session.  Focus on activity tolerance with incorporation of energy conservation strategies during adl/mobility.  Provided education on chf exacerbation prevention.  Pt. Receptive and eager to get better per his report.  Cont. To progress activity tolerance with adls next session.     Recommendations for follow up therapy are one component of a multi-disciplinary discharge planning process, led by the attending physician.  Recommendations may be updated based on patient status, additional functional criteria and insurance authorization.    Follow Up Recommendations  No OT follow up    Assistance Recommended at Discharge Intermittent Supervision/Assistance  Patient can return home with the following  A little help with bathing/dressing/bathroom;Assistance with cooking/housework   Equipment Recommendations  None recommended by OT    Recommendations for Other Services      Precautions / Restrictions Precautions Precautions: Fall Precaution Comments: monitor O2       Mobility Bed Mobility Overal bed mobility: Modified Independent Bed Mobility: Supine to Sit     Supine to sit: Modified independent (Device/Increase time)     General bed mobility comments: sitting eob at end of session    Transfers Overall transfer level: Needs assistance Equipment used: Rolling walker (2 wheels) Transfers: Sit to/from Stand, Bed to chair/wheelchair/BSC Sit to Stand: Supervision                 Balance                                           ADL either performed or assessed with clinical judgement   ADL Overall  ADL's : Needs assistance/impaired     Grooming: Wash/dry hands;Standing;Supervision/safety                   Toilet Transfer: Supervision/safety;Rolling walker (2 wheels) Toilet Transfer Details (indicate cue type and reason): simulated during in room mobility, declined need to use the b.room         Functional mobility during ADLs: Supervision/safety;Rolling walker (2 wheels) General ADL Comments: focused on mobiltiy and activity tolerance-with focus on energy conservation strategies during adls/mobiltiy.  also reviewed CHF exacerbation prevention strategies.    Extremity/Trunk Assessment              Vision       Perception     Praxis      Cognition Arousal/Alertness: Awake/alert Behavior During Therapy: WFL for tasks assessed/performed, Anxious Overall Cognitive Status: Within Functional Limits for tasks assessed                                          Exercises      Shoulder Instructions       General Comments      Pertinent Vitals/ Pain       Pain Assessment Pain Assessment: No/denies pain  Home Living  Prior Functioning/Environment              Frequency  Min 2X/week        Progress Toward Goals  OT Goals(current goals can now be found in the care plan section)  Progress towards OT goals: Progressing toward goals     Plan Discharge plan remains appropriate    Co-evaluation                 AM-PAC OT "6 Clicks" Daily Activity     Outcome Measure   Help from another person eating meals?: None Help from another person taking care of personal grooming?: A Little Help from another person toileting, which includes using toliet, bedpan, or urinal?: A Little Help from another person bathing (including washing, rinsing, drying)?: A Little Help from another person to put on and taking off regular upper body clothing?: A Little Help from another  person to put on and taking off regular lower body clothing?: A Little 6 Click Score: 19    End of Session Equipment Utilized During Treatment: Rolling walker (2 wheels);Oxygen  OT Visit Diagnosis: Muscle weakness (generalized) (M62.81)   Activity Tolerance Patient tolerated treatment well   Patient Left in bed;with call bell/phone within reach   Nurse Communication          Time: 7564-3329 OT Time Calculation (min): 16 min  Charges: OT General Charges $OT Visit: 1 Visit OT Treatments $Self Care/Home Management : 8-22 mins  Boneta Lucks, COTA/L Acute Rehabilitation [phone]   Alessandra Bevels Lorraine-COTA/L 03/07/2022, 12:14 PM

## 2022-03-07 NOTE — Progress Notes (Signed)
Pt states that he will self administer the cpap when he is ready for bed. RT will check back later on.

## 2022-03-07 NOTE — Progress Notes (Signed)
Rounding Note    Patient Name: Luis [name] Date of Encounter: 03/07/2022  [facility] Cardiologist: Bryan Lemma, MD   Subjective   Complaining from pain at [facility] site. Cough and SOB improved. Net negative 2.5L  Right heart cath with relatively normal filling pressures with RAP , mPAP , PCWP . CO 4.28, CI 1.82.   Cr 1.18>1.03  Inpatient Medications    Scheduled Meds:  carvedilol  12.5 mg Oral BID WC   clopidogrel  75 mg Oral Daily   dapagliflozin propanediol  10 mg Oral Daily   DULoxetine  90 mg Oral Daily   furosemide  80 mg Oral Daily   heparin  5,000 Units Subcutaneous Q8H   insulin aspart  0-20 Units Subcutaneous TID WC   insulin aspart  0-5 Units Subcutaneous QHS   insulin aspart  8 Units Subcutaneous TID WC   insulin glargine-yfgn  20 Units Subcutaneous BID   neomycin-polymyxin-hydrocortisone  4 drop Left EAR QID   pantoprazole  40 mg Oral Daily   prazosin  2 mg Oral QHS   rosuvastatin  20 mg Oral Daily   sacubitril-valsartan  1 tablet Oral BID   sodium chloride flush  3 mL Intravenous Q12H   sodium chloride flush  3 mL Intravenous Q12H   spironolactone  25 mg Oral Daily   traZODone  200 mg Oral QHS   Continuous Infusions:  sodium chloride     sodium chloride     PRN Meds: sodium chloride, sodium chloride, acetaminophen, benzonatate, hydrOXYzine, ipratropium-albuterol, ondansetron (ZOFRAN) IV, sodium chloride flush, sodium chloride flush   Vital Signs    Vitals:   03/06/22 1702 03/06/22 1707 03/06/22 2039 03/07/22 0544  BP: (!) 129/93 (!) 142/88 126/67 128/78  Pulse: 87 83 90 92  Resp: (!) 21 19 20 20   Temp:   97.8 F (36.6 C) 97.9 F (36.6 C)  TempSrc:   Oral Oral  SpO2: 97% 97% 97% 94%  Weight:    132.1 kg  Height:        Intake/Output Summary (Last 24 hours) at 03/07/2022 0912 Last data filed at 03/07/2022 0400 Gross per 24 hour  Intake 360 ml  Output 2850 ml  Net -2490 ml       03/07/2022    5:44 AM  03/06/2022    5:00 AM 03/05/2022    2:35 PM  Last 3 Weights  Weight (lbs) 291 lb 3.6 oz 291 lb 291 lb 3.6 oz  Weight (kg) 132.1 kg 131.997 kg 132.1 kg      Telemetry    A-sensed, V-paced- Personally Reviewed  ECG    No new tracing - Personally Reviewed  Physical Exam   GEN: Sitting up in bed, comfortable Neck: Significant TTP at RHC site Cardiac: RRR, no murmurs Respiratory: CTAB GI: Obese, soft, NTTP MS: No edema, warm Neuro:  Nonfocal  Psych: Normal affect   Labs    High Sensitivity Troponin:   Recent Labs  Lab 02/26/22 0739 02/27/22 2012 02/27/22 2256 02/28/22 1859 02/28/22 2030  TROPONINIHS 12 19* 20* 14 15      Chemistry Recent Labs  Lab 03/03/22 0228 03/04/22 0926 03/05/22 0343 03/06/22 0540 03/07/22 0333  NA 136 134* 136 135 135  K 3.8 4.5 3.9 4.5 4.1  CL 101 100 101 99 99  CO2 26 24 26 23 24   GLUCOSE 196* 248* 225* 276* 163*  BUN 25* 22* 29* 28* 23*  CREATININE 1.08 1.14 1.26* 1.18 1.03  CALCIUM 9.0 9.1  9.1 9.5 9.0  MG 2.4 2.3 2.4 2.5*  --   ALBUMIN 3.7  --  3.8 4.0  --   GFRNONAA >60 >60 >60 >60 >60  ANIONGAP 9 10 9 13 12      Lipids No results for input(s): "CHOL", "TRIG", "HDL", "LABVLDL", "LDLCALC", "CHOLHDL" in the last 168 hours.  Hematology Recent Labs  Lab 03/03/22 0228 03/05/22 0343 03/06/22 0540  WBC 9.0 11.3* 9.8  RBC 4.70 4.98 5.23  HGB 13.8 14.5 14.9  HCT 40.9 42.9 46.3  MCV 87.0 86.1 88.5  MCH 29.4 29.1 28.5  MCHC 33.7 33.8 32.2  RDW 12.9 13.1 13.1  PLT 268 286 297    Thyroid  Recent Labs  Lab 03/01/22 0918  TSH 0.117*  FREET4 1.11     BNP Recent Labs  Lab 03/03/22 0228  BNP 62.2     DDimer No results for input(s): "DDIMER" in the last 168 hours.   Radiology    CARDIAC CATHETERIZATION  Result Date: 03/06/2022 1.  Relatively normal filling pressures with a mean RA pressure of 8 mmHg, mean wedge pressure of 13 mmHg, mean PA pressure of 22 mmHg, with a mildly reduced Fick cardiac output of 4.28 L/min and  index of 1.82 L/min/m    Cardiac Studies   RHC 03/06/22: RAP 8 mPAP 22 PCWP 13 CO 4.28 CI 1.82  TTE 02/27/2022  1. Left ventricular ejection fraction, by estimation, is 25%. The left  ventricle has severely decreased function. The left ventricle demonstrates  global hypokinesis. Cannot comment on regional wall motion abnormalities  and Definity contrast was not  attempted. Consider repeat limited echo for RWM assessment if clinically  indicated. The left ventricular internal cavity size was mildly dilated.  Left ventricular diastolic parameters are indeterminate.   2. Right ventricular systolic function is mildly reduced. The right  ventricular size is normal. Tricuspid regurgitation signal is inadequate  for assessing PA pressure.   3. The mitral valve is grossly normal. Trivial mitral valve  regurgitation. No evidence of mitral stenosis.   4. The aortic valve is grossly normal. There is mild calcification of the  aortic valve. Aortic valve regurgitation is not visualized. No aortic  stenosis is present.   5. The inferior vena cava is normal in size with greater than 50%  respiratory variability, suggesting right atrial pressure of 3 mmHg.   Patient Profile     51 y.o. male with history of nonischemic cardiomyopathy, chronic systolic heart failure status post BiV ICD, chronic respiratory failure on 2 L nasal cannula, OSA, diabetes, cocaine abuse who was admitted on 02/26/2022 for acute on chronic systolic heart failure.  Assessment & Plan    #Acute on Chronic Systolic HF, EF [id number]: #Non-ischemic CM: Patient presented with worsening chest pain and SOB found to be overloaded on exam with pulmonary edema on CXR consistent with acute on chronic systolic heart failure. Has been diuresing well. Volume status difficult to assess given body habitus. Underwent RHC on 03/06/22 with relatively normal filling pressures. Now transitioned to PO lasix. -RHC with relatively normal filling  pressures -Continue lasix 80mg  PO daily -Continue coreg 12.5mg  BID  -Continue entresto 24/26mg  BID -Continue spiro 25mg  daily -Continue farxiga 10mg  daily -Discussed importance of cocaine cessation -ICD is approaching end-of-life and will need generator change to be performed by [facility]  #Pain at [facility] site: Patient very tender at [facility] site. States it feels swollen. Will check ultrasound to evaluate further.  -Check ultrasound of righ IJ  #Sinus Tachycardia: Improved  with increase in coreg -Continue coreg 12.5mg  BID; up-titrate as tolerated  #Chronic Respiratory Failure with Hypoxia: -Continue home O2 and CPAP  #OSA: -Continue CPAP  #DMII: -Management per IM  #Morbid Obesity: BMI 48.5.>47.7      For questions or updates, please contact [facility] Please consult [email or web address] for contact info under        Signed, Meriam Sprague, MD  03/07/2022, 9:12 AM

## 2022-03-08 DIAGNOSIS — J9611 Chronic respiratory failure with hypoxia: Secondary | ICD-10-CM | POA: Diagnosis not present

## 2022-03-08 DIAGNOSIS — E119 Type 2 diabetes mellitus without complications: Secondary | ICD-10-CM | POA: Diagnosis not present

## 2022-03-08 DIAGNOSIS — I5043 Acute on chronic combined systolic (congestive) and diastolic (congestive) heart failure: Secondary | ICD-10-CM | POA: Diagnosis not present

## 2022-03-08 DIAGNOSIS — F1410 Cocaine abuse, uncomplicated: Secondary | ICD-10-CM | POA: Diagnosis not present

## 2022-03-08 LAB — BASIC METABOLIC PANEL
Anion gap: 9 (ref 5–15)
BUN: 21 mg/dL — ABNORMAL HIGH (ref 6–20)
CO2: 26 mmol/L (ref 22–32)
Calcium: 8.9 mg/dL (ref 8.9–10.3)
Chloride: 101 mmol/L (ref 98–111)
Creatinine, Ser: 1.03 mg/dL (ref 0.61–1.24)
GFR, Estimated: >60 mL/min (ref >60)
Glucose, Bld: 193 mg/dL — ABNORMAL HIGH (ref 70–99)
Potassium: 4.5 mmol/L (ref 3.5–5.1)
Sodium: 136 mmol/L (ref 135–145)

## 2022-03-08 LAB — GLUCOSE, CAPILLARY
Glucose-Capillary: 164 mg/dL — ABNORMAL HIGH (ref 70–99)
Glucose-Capillary: 224 mg/dL — ABNORMAL HIGH (ref 70–99)

## 2022-03-08 MED ORDER — SPIRONOLACTONE 25 MG PO TABS: 25.0000 mg | ORAL_TABLET | Freq: Every day | ORAL | 0 refills | Status: DC

## 2022-03-08 MED ORDER — CARVEDILOL 12.5 MG PO TABS: 12.5000 mg | ORAL_TABLET | Freq: Two times a day (BID) | ORAL | 0 refills | Status: DC

## 2022-03-08 MED ORDER — TERBINAFINE HCL 1 % EX CREA: TOPICAL_CREAM | Freq: Two times a day (BID) | CUTANEOUS | 0 refills | Status: DC

## 2022-03-08 MED ORDER — SACUBITRIL-VALSARTAN 24-26 MG PO TABS: 1.0000 | ORAL_TABLET | Freq: Two times a day (BID) | ORAL | 0 refills | Status: DC

## 2022-03-08 MED ORDER — DAPAGLIFLOZIN PROPANEDIOL 10 MG PO TABS: 10.0000 mg | ORAL_TABLET | Freq: Every day | ORAL | 0 refills | Status: DC

## 2022-03-08 MED ORDER — TERBINAFINE HCL 1 % EX CREA
TOPICAL_CREAM | Freq: Two times a day (BID) | CUTANEOUS | Status: DC
  Filled 2022-03-08: qty 12

## 2022-03-08 NOTE — Discharge Summary (Signed)
Physician Discharge Summary   Patient: Luis [name] MRN: [id number] DOB: 09-03-1970  Admit date:     02/26/2022  Discharge date: 03/08/2022   Discharge Physician: Patrecia Pour   PCP: Buzzy Han, MD (Inactive)   Recommendations at discharge:  Continue GDMT for HFrEF, newly including entresto, farxiga, spironolactone, and coreg. Continue lasix. Follow up with [facility] cardiology per patient preference for CHF and will need generator replacement for PPM. Follow up with PCP in next 1-2 weeks for management of IDT2DM Continue encouragement of cocaine cessation. Note patient discharged with 3L O2 on exertion, consider recheck at follow up. Recommend CBC and BMP at follow up and repeat TSH, T3, T4 in 4-6 weeks for suppressed TSH with normal T4 here. Follow up with ENT for soft tissue mass in left ear canal. Continuing topical antimicrobial therapy, no evidence of infection at time of discharge.   Discharge Diagnoses: Principal Problem:   Acute on chronic combined systolic and diastolic CHF (congestive heart failure) (HCC) Active Problems:   Cocaine abuse (HCC)   Pacemaker   HLD (hyperlipidemia)   Diabetes mellitus without complication (HCC)   Hypertension   Tobacco abuse   Morbid obesity (HCC)   Sleep apnea   Chronic respiratory failure with hypoxia (HCC) - 2.5 L/min  Hospital Course: 51 year old M with PMH of combined CHF, PPM, chronic hypoxic RF on 2.5 L, OSA on CPAP, morbid obesity and ongoing cocaine use disorder presenting with progressive shortness of breath for about a week despite increasing his home Lasix, and admitted for acute on chronic combined CHF.  BNP 136.  Chest x-ray consistent with CHF.  Urine is positive for cocaine.  TTE ordered.  Patient was started on IV Lasix.   TTE with LVEF of 25% (30 to 35% in 2022). Cardiology directed diuresis and GDMT. [place] 9/8 showed relatively normal filling pressures and the patient appears euvolemic. Lasix transitioned to po  with continued improvement/stability, and the patient is now stable for discharge per medical and cardiology teams. Details below.  Assessment and Plan: Acute on chronic combined CHF: TTE with LVEF of 25%.  (LVEF of 30 to 35%, GH, G2-DD in 8/22).   - Weight down to 132.9kg on discharge.  - RHC 9/8 showed mildly elevated pressures and mildly diminished cardiac output (CO 4.28, CI 1.82). Mean RA 8, PCWP 13, PA mean 22.  - GDMT: Lasix, Farxiga, Coreg, Aldactone and Entresto. Pt insured. - Cardiology to arrange follow up. - Encouraged to stop cocaine use.   History of ventricular tachycardia/PPM: PPM approaching end of battery life. - Need generator change by primary EP cardiology at [facility] upon discharge. Pt prefers to have this done by [facility]. Cardiology to arrange follow up.   Cocaine abuse: UDS positive but he denies use stating that he was around someone who was using cocaine. - Advised to refrain from cocaine.   Chronic respiratory failure with hypoxia: Stable on home 3L - Continue home oxygen and CPAP - Manage CHF as above   Obstructive sleep apnea - Continue CPAP at night.   Hypertension: Normotensive for most part. - Cardiac meds as above.   IDDM-2 with hyperglycemia and HLD: Seems he is on Lantus, NovoLog and Victoza at home.  A1c 9.1%. - Follow up with PCP for tight control. Required less insulin while admitted likely due to dietary differences.   Mood disorder: Stable. - Continue home meds.   Hypokalemia/hyperphosphatemia: Hypokalemia resolved. - Recheck at follow up   Productive cough: WBC, PCT, temperature are normal.  He denies smoking.  Has history of ACE inhibitor intolerance.  - Improving with CHF management.   Left ear pain: small tissue growth with small pus looking tip over posterior wall of external ear canal on otoscopic exam.  Improved. - Continue home antibiotic drop. Suggest ENT follow up.   Euthyroid sick syndrome: TSH low but free T4 normal. - Repeat  TFT in 4 to 6 weeks.   Generalized weakness/physical deconditioning - HH PT/aide/rollator ordered. Continue home O2.    Right neck tender induration at site of right IJ access for RHC 9/8.  - U/S reassuring, no clot or hematoma. Site improving clinically.   Yeast infection: Frequent urination into urinal causing irritation.  - Topical terbinafine prescribed.   Morbid obesity: Body mass index is 48.01 kg/m.   Consultants: Cardiology Procedures performed: RHC, echo  Disposition: Home Diet recommendation:  Discharge Diet Orders (From admission, onward)     Start     Ordered   03/08/22 0000  Diet - low sodium heart healthy        03/08/22 0754   03/08/22 0000  Diet Carb Modified        03/08/22 0754           Cardiac and Carb modified diet DISCHARGE MEDICATION: Allergies as of 03/08/2022       Reactions   Aspirin Anaphylaxis, Other (See Comments)   Throat closing   Iodine-131 Hives   Iodinated Contrast Media Hives   Latex Hives, Rash   Penicillins Nausea And Vomiting   Ultram [tramadol] Hives, Nausea Only   Augmentin [amoxicillin-pot Clavulanate] Diarrhea   Lidocaine Rash   Zestril [lisinopril] Cough        Medication List     STOP taking these medications    buPROPion HCl ER (XL) 450 MG Tb24   divalproex 500 MG DR tablet Commonly known as: DEPAKOTE   insulin aspart protamine- aspart (70-30) 100 UNIT/ML injection Commonly known as: NOVOLOG MIX 70/30   sertraline 50 MG tablet Commonly known as: ZOLOFT   Victoza 18 MG/3ML Sopn Generic drug: liraglutide       TAKE these medications    carvedilol 12.5 MG tablet Commonly known as: COREG Take 1 tablet (12.5 mg total) by mouth 2 (two) times daily with a meal. What changed:  medication strength how much to take   clopidogrel 75 MG tablet Commonly known as: PLAVIX Take 1 tablet (75 mg total) by mouth daily.   cyclobenzaprine 10 MG tablet Commonly known as: FLEXERIL Take 10 mg by mouth 3 (three)  times daily.   dapagliflozin propanediol 10 MG Tabs tablet Commonly known as: FARXIGA Take 1 tablet (10 mg total) by mouth daily.   DULoxetine 30 MG capsule Commonly known as: CYMBALTA Take 3 capsules (90 mg total) by mouth daily.   furosemide 80 MG tablet Commonly known as: LASIX Take 1 tablet (80 mg total) by mouth daily.   HumaLOG Mix 75/25 KwikPen (75-25) 100 UNIT/ML Kwikpen Generic drug: Insulin Lispro Prot & Lispro Inject 70 Units into the skin 3 (three) times daily.   hydrOXYzine 25 MG tablet Commonly known as: ATARAX Take 1 tablet (25 mg total) by mouth 3 (three) times daily as needed for anxiety.   Lantus SoloStar 100 UNIT/ML Solostar Pen Generic drug: insulin glargine Inject 50 Units into the skin 2 (two) times daily.   neomycin-polymyxin-hydrocortisone 3.5-10000-1 OTIC suspension Commonly known as: CORTISPORIN Place 4 drops into the left ear 4 (four) times daily. 10 day course.   pantoprazole  40 MG tablet Commonly known as: PROTONIX Take 1 tablet (40 mg total) by mouth daily.   prazosin 2 MG capsule Commonly known as: MINIPRESS Take 1 capsule (2 mg total) by mouth at bedtime.   rosuvastatin 20 MG tablet Commonly known as: CRESTOR Take 1 tablet (20 mg total) by mouth daily.   sacubitril-valsartan 24-26 MG Commonly known as: ENTRESTO Take 1 tablet by mouth 2 (two) times daily.   spironolactone 25 MG tablet Commonly known as: ALDACTONE Take 1 tablet (25 mg total) by mouth daily.   terbinafine 1 % cream Commonly known as: LAMISIL Apply topically 2 (two) times daily.   traZODone 100 MG tablet Commonly known as: DESYREL Take 2 tablets (200 mg total) by mouth at bedtime.   Trulicity 1.5 0000000 Sopn Generic drug: Dulaglutide Inject 1.5 mg into the skin once a week. What changed: when to take this               Durable Medical Equipment  (From admission, onward)           Start     Ordered   03/02/22 1105  For home use only DME 4  wheeled rolling walker with seat  Once       Question:  Patient needs a walker to treat with the following condition  Answer:  Shortness of breath   03/02/22 1104            Joiner. Call.   Why: Home health has been arranged. They will contact you within 48hrs post discharge to schedule apt Contact information: [address] [place] [address] [phone]         Buzzy Han, MD. Schedule an appointment as soon as possible for a visit in 1 week(s).   Specialty: Family Medicine Contact information: [place] [address] [phone]         Freada Bergeron, MD. Schedule an appointment as soon as possible for a visit.   Specialties: Cardiology, Radiology Contact information: [id number] [place] [address] [place], [place]. Schedule an appointment as soon as possible for a visit.   Contact information: [address] [place] [address] [phone]                Discharge Exam: Danley Danker Weights   03/06/22 0500 03/07/22 0544 03/08/22 0531  Weight: 132 kg 132.1 kg 132.9 kg  BP 119/69   Pulse 83   Temp 98.6 F (37 C) (Oral)   Resp 20   Ht 5' 5.5" (1.664 m)   Wt 132.9 kg   SpO2 100%   BMI 48.01 kg/m   No distress, obese, pleasant, interactive RRR without MRG, No significant LE edema Clear, nonlabored on room air at rest Otoscopic exam of left ear confirms supero-posterior (1-2 o'clock), small estimated ~61mm skin-colored papule with dried blood on lateral aspect. No erythema or discharge. TM normal. Right IJ site is much less indurated and tender than previous exam. No pulsation/bruit. No warmth  Condition at discharge: stable  The results of significant diagnostics from this hospitalization (including imaging, microbiology, ancillary and laboratory) are listed below for reference.   Imaging  Studies: VAS [place] UPPER EXTREMITY VENOUS DUPLEX  Result Date: 03/07/2022 UPPER VENOUS STUDY  Patient Name:  Luis [name]  Date of Exam:   03/07/2022 Medical Rec #: [id number]  Accession #:    [id number] Date of Birth: 11-19-1970               Patient Gender: M Patient Age:   61 years Exam Location:  [facility] Procedure:      VAS [place] UPPER EXTREMITY VENOUS DUPLEX Referring Phys: CALLIE GOODRICH --------------------------------------------------------------------------------  Indications: Pain, Swelling, and Erythema right neck status post right heart cath through jugular vein. Physician requests to rule out large hematoma at site of concern. Limitations: Pain at site of concern, body habitus. Comparison Study: No prior study Performing Technologist: Sherren Kerns RVS  Examination Guidelines: A complete evaluation includes B-mode imaging, spectral Doppler, color Doppler, and power Doppler as needed of all accessible portions of each vessel. Bilateral testing is considered an integral part of a complete examination. Limited examinations for reoccurring indications may be performed as noted.  Right Findings: +----------+------------+---------+-----------+----------+-------+ RIGHT     CompressiblePhasicitySpontaneousPropertiesSummary +----------+------------+---------+-----------+----------+-------+ IJV           Full       Yes       Yes                      +----------+------------+---------+-----------+----------+-------+ Subclavian               Yes       Yes                      +----------+------------+---------+-----------+----------+-------+ Axillary                 Yes       Yes                      +----------+------------+---------+-----------+----------+-------+  Summary:  Right: There is no DVT noted in the IJV, subclavian, or axillary veins. No hematoma noted.  *See table(s) above for measurements and observations.    Preliminary    CARDIAC  CATHETERIZATION  Result Date: 03/06/2022 1.  Relatively normal filling pressures with a mean RA pressure of 8 mmHg, mean wedge pressure of 13 mmHg, mean PA pressure of 22 mmHg, with a mildly reduced Fick cardiac output of 4.28 L/min and index of 1.82 L/min/m   ECHOCARDIOGRAM COMPLETE  Result Date: 02/27/2022    ECHOCARDIOGRAM REPORT   Patient Name:   Luis [name] Date of Exam: 02/27/2022 Medical Rec #:  [id number]             Height:       65.5 in Accession #:    [id number]            Weight:       298.3 lb Date of Birth:  [birth year]              BSA:          2.358 m Patient Age:    51 years              BP:           141/60 mmHg Patient Gender: M                     HR:           93 bpm. Exam Location:  Inpatient Procedure: 2D Echo                              MODIFIED REPORT: This report was modified by  Cherlynn Kaiser MD on 02/27/2022 due to revision.  Indications:     CHF  History:         Patient has prior history of Echocardiogram examinations, most                  recent 01/28/2021. CHF, Pacemaker; Risk Factors:Dyslipidemia,                  Diabetes, Hypertension, Current Smoker and cocaine abuse.  Sonographer:     Harvie Junior Referring Phys:  [id number] ERIC CHEN Diagnosing Phys: Cherlynn Kaiser MD  Sonographer Comments: Technically difficult study due to poor echo windows and patient is obese. Image acquisition challenging due to patient body habitus, Image acquisition challenging due to uncooperative patient and refused ultrasound contrast. IMPRESSIONS  1. Left ventricular ejection fraction, by estimation, is 25%. The left ventricle has severely decreased function. The left ventricle demonstrates global hypokinesis. Cannot comment on regional wall motion abnormalities and Definity contrast was not attempted. Consider repeat limited echo for RWM assessment if clinically indicated. The left ventricular internal cavity size was mildly dilated. Left ventricular diastolic parameters are indeterminate.  2.  Right ventricular systolic function is mildly reduced. The right ventricular size is normal. Tricuspid regurgitation signal is inadequate for assessing PA pressure.  3. The mitral valve is grossly normal. Trivial mitral valve regurgitation. No evidence of mitral stenosis.  4. The aortic valve is grossly normal. There is mild calcification of the aortic valve. Aortic valve regurgitation is not visualized. No aortic stenosis is present.  5. The inferior vena cava is normal in size with greater than 50% respiratory variability, suggesting right atrial pressure of 3 mmHg. FINDINGS  Left Ventricle: Left ventricular ejection fraction, by estimation, is 25%. The left ventricle has severely decreased function. The left ventricle demonstrates global hypokinesis. The left ventricular internal cavity size was mildly dilated. There is no left ventricular hypertrophy. Left ventricular diastolic parameters are indeterminate. Right Ventricle: The right ventricular size is normal. No increase in right ventricular wall thickness. Right ventricular systolic function is mildly reduced. Tricuspid regurgitation signal is inadequate for assessing PA pressure. Left Atrium: Left atrial size was normal in size. Right Atrium: Right atrial size was normal in size. Pericardium: There is no evidence of pericardial effusion. Mitral Valve: The mitral valve is grossly normal. Trivial mitral valve regurgitation. No evidence of mitral valve stenosis. Tricuspid Valve: The tricuspid valve is normal in structure. Tricuspid valve regurgitation is not demonstrated. No evidence of tricuspid stenosis. Aortic Valve: The aortic valve is grossly normal. There is mild calcification of the aortic valve. Aortic valve regurgitation is not visualized. No aortic stenosis is present. Aortic valve mean gradient measures 4.0 mmHg. Aortic valve peak gradient measures 7.0 mmHg. Aortic valve area, by VTI measures 2.22 cm. Pulmonic Valve: The pulmonic valve was normal in  structure. Pulmonic valve regurgitation is trivial. No evidence of pulmonic stenosis. Aorta: The aortic root is normal in size and structure. Venous: The inferior vena cava is normal in size with greater than 50% respiratory variability, suggesting right atrial pressure of 3 mmHg. IAS/Shunts: The interatrial septum was not well visualized. Additional Comments: A device lead is visualized in the right atrium and right ventricle.  LEFT VENTRICLE PLAX 2D LVIDd:         5.90 cm   Diastology LVIDs:         4.65 cm   LV e' medial:    8.49 cm/s LV PW:  1.20 cm   LV E/e' medial:  10.4 LV IVS:        1.20 cm   LV e' lateral:   5.11 cm/s LVOT diam:     2.10 cm   LV E/e' lateral: 17.2 LV SV:         52 LV SV Index:   22 LVOT Area:     3.46 cm  RIGHT VENTRICLE RV Basal diam:  3.60 cm RV Mid diam:    3.50 cm RV S prime:     17.10 cm/s TAPSE (M-mode): 1.8 cm LEFT ATRIUM             Index        RIGHT ATRIUM           Index LA diam:        4.10 cm 1.74 cm/m   RA Area:     10.40 cm LA Vol (A2C):   66.4 ml 28.16 ml/m  RA Volume:   20.90 ml  8.86 ml/m LA Vol (A4C):   40.9 ml 17.35 ml/m LA Biplane Vol: 52.1 ml 22.10 ml/m  AORTIC VALVE                    PULMONIC VALVE AV Area (Vmax):    2.43 cm     PV Vmax:       1.05 m/s AV Area (Vmean):   2.40 cm     PV Peak grad:  4.4 mmHg AV Area (VTI):     2.22 cm AV Vmax:           132.00 cm/s AV Vmean:          88.200 cm/s AV VTI:            0.232 m AV Peak Grad:      7.0 mmHg AV Mean Grad:      4.0 mmHg LVOT Vmax:         92.70 cm/s LVOT Vmean:        61.000 cm/s LVOT VTI:          0.149 m LVOT/AV VTI ratio: 0.64  AORTA Ao Root diam: 3.00 cm Ao Asc diam:  3.00 cm MITRAL VALVE MV Area (PHT): 6.43 cm    SHUNTS MV Decel Time: 118 msec    Systemic VTI:  0.15 m MR Peak grad: 18.1 mmHg    Systemic Diam: 2.10 cm MR Vmax:      212.50 cm/s MV E velocity: 88.10 cm/s MV A velocity: 44.10 cm/s MV E/A ratio:  2.00 Cherlynn Kaiser MD Electronically signed by Cherlynn Kaiser MD Signature  Date/Time: 02/27/2022/4:45:50 PM    Final (Updated)    DG Chest Port 1 View  Result Date: 02/26/2022 CLINICAL DATA:  Shortness of breath EXAM: PORTABLE CHEST 1 VIEW COMPARISON:  08/10/2021 FINDINGS: Cardiac shadow is enlarged. Defibrillator is again noted. Lungs are well aerated bilaterally. No focal infiltrate or sizable effusion is seen. Mild central vascular congestion is noted. IMPRESSION: Mild vascular congestion without edema. Stable cardiomegaly. Electronically Signed   By: Inez Catalina M.D.   On: 02/26/2022 03:28    Microbiology: Results for orders placed or performed during the hospital encounter of 08/05/21  Resp Panel by RT-PCR (Flu A&B, Covid) Nasopharyngeal Swab     Status: None   Collection Time: 08/05/21 12:43 PM   Specimen: Nasopharyngeal Swab; Nasopharyngeal(NP) swabs in vial transport medium  Result Value Ref Range Status   SARS Coronavirus 2 by RT PCR NEGATIVE NEGATIVE Final  Comment: (NOTE) SARS-CoV-2 target nucleic acids are NOT DETECTED.  The SARS-CoV-2 RNA is generally detectable in upper respiratory specimens during the acute phase of infection. The lowest concentration of SARS-CoV-2 viral copies this assay can detect is 138 copies/mL. A negative result does not preclude SARS-Cov-2 infection and should not be used as the sole basis for treatment or other patient management decisions. A negative result may occur with  improper specimen collection/handling, submission of specimen other than nasopharyngeal swab, presence of viral mutation(s) within the areas targeted by this assay, and inadequate number of viral copies(<138 copies/mL). A negative result must be combined with clinical observations, patient history, and epidemiological information. The expected result is Negative.  Fact Sheet for Patients:  BloggerCourse.com  Fact Sheet for Healthcare Providers:  SeriousBroker.it  This test is no t yet approved or  cleared by the [place] FDA and  has been authorized for detection and/or diagnosis of SARS-CoV-2 by FDA under an Emergency Use Authorization (EUA). This EUA will remain  in effect (meaning this test can be used) for the duration of the COVID-19 declaration under Section 564(b)(1) of the Act, 21 U.S.C.section 360bbb-3(b)(1), unless the authorization is terminated  or revoked sooner.       Influenza A by PCR NEGATIVE NEGATIVE Final   Influenza B by PCR NEGATIVE NEGATIVE Final    Comment: (NOTE) The Xpert Xpress SARS-CoV-2/FLU/RSV plus assay is intended as an aid in the diagnosis of influenza from Nasopharyngeal swab specimens and should not be used as a sole basis for treatment. Nasal washings and aspirates are unacceptable for Xpert Xpress SARS-CoV-2/FLU/RSV testing.  Fact Sheet for Patients: BloggerCourse.com  Fact Sheet for Healthcare Providers: SeriousBroker.it  This test is not yet approved or cleared by the [place] FDA and has been authorized for detection and/or diagnosis of SARS-CoV-2 by FDA under an Emergency Use Authorization (EUA). This EUA will remain in effect (meaning this test can be used) for the duration of the COVID-19 declaration under Section 564(b)(1) of the Act, 21 U.S.C. section 360bbb-3(b)(1), unless the authorization is terminated or revoked.  Performed at [facility], [address] [place], [place] [address]     Labs: CBC: Recent Labs  Lab 03/02/22 0225 03/03/22 0228 03/05/22 0343 03/06/22 0540  WBC 8.1 9.0 11.3* 9.8  HGB 13.1 13.8 14.5 14.9  HCT 40.3 40.9 42.9 46.3  MCV 87.6 87.0 86.1 88.5  PLT 293 268 286 297   Basic Metabolic Panel: Recent Labs  Lab 03/02/22 0225 03/03/22 0228 03/04/22 0926 03/05/22 0343 03/06/22 0540 03/07/22 0333 03/08/22 0442  NA 139 136 134* 136 135 135 136  K 3.8 3.8 4.5 3.9 4.5 4.1 4.5  CL 102 101 100 101 99 99 101  CO2 27 26 24 26 23  24 26   GLUCOSE 190* 196* 248* 225* 276* 163* 193*  BUN 23* 25* 22* 29* 28* 23* 21*  CREATININE 1.21 1.08 1.14 1.26* 1.18 1.03 1.03  CALCIUM 9.0 9.0 9.1 9.1 9.5 9.0 8.9  MG 2.2 2.4 2.3 2.4 2.5*  --   --   PHOS 5.4* 5.3*  --  4.8* 5.0*  --   --    Liver Function Tests: Recent Labs  Lab 03/02/22 0225 03/03/22 0228 03/05/22 0343 03/06/22 0540  ALBUMIN 3.5 3.7 3.8 4.0   CBG: Recent Labs  Lab 03/07/22 0604 03/07/22 1149 03/07/22 1631 03/07/22 2034 03/08/22 0615  GLUCAP 187* 174* 171* 188* 164*    Discharge time spent: greater than 30 minutes.  Signed: Patrecia Pour, MD [facility] 03/08/2022

## 2022-03-08 NOTE — Progress Notes (Signed)
SATURATION QUALIFICATIONS: (This note is used to comply with regulatory documentation for home oxygen)  Patient Saturations on Room Air at Rest = 97%  Patient Saturations on Room Air while Ambulating = 85%  Patient Saturations on 3 Liters of oxygen while Ambulating = 97%  Please briefly explain why patient needs home oxygen? Desaturation on room air while ambulating.    Erick Blinks, RN

## 2022-03-08 NOTE — Progress Notes (Signed)
Rounding Note    Patient Name: Luis [name] Date of Encounter: 03/08/2022  [place] Cardiologist: Glenetta Hew, MD   Subjective   Feels well. Cough better. Ambulated the hallway. Ready to go home.  RIJ ultrasound without clot or hematoma.  Cr stable 1.03  Inpatient Medications    Scheduled Meds:  carvedilol  12.5 mg Oral BID WC   clopidogrel  75 mg Oral Daily   dapagliflozin propanediol  10 mg Oral Daily   DULoxetine  90 mg Oral Daily   furosemide  80 mg Oral Daily   heparin  5,000 Units Subcutaneous Q8H   insulin aspart  0-20 Units Subcutaneous TID WC   insulin aspart  0-5 Units Subcutaneous QHS   insulin aspart  8 Units Subcutaneous TID WC   insulin glargine-yfgn  20 Units Subcutaneous BID   neomycin-polymyxin-hydrocortisone  4 drop Left EAR QID   pantoprazole  40 mg Oral Daily   prazosin  2 mg Oral QHS   rosuvastatin  20 mg Oral Daily   sacubitril-valsartan  1 tablet Oral BID   sodium chloride flush  3 mL Intravenous Q12H   sodium chloride flush  3 mL Intravenous Q12H   spironolactone  25 mg Oral Daily   traZODone  200 mg Oral QHS   Continuous Infusions:  sodium chloride     sodium chloride     PRN Meds: sodium chloride, sodium chloride, acetaminophen, benzonatate, hydrOXYzine, ipratropium-albuterol, ondansetron (ZOFRAN) IV, oxyCODONE, sodium chloride flush, sodium chloride flush   Vital Signs    Vitals:   03/07/22 2029 03/07/22 2344 03/08/22 0531 03/08/22 0603  BP: 136/67  (!) 115/90 (!) 115/90  Pulse: 89 75 81 78  Resp: 19 14 15    Temp: 98.3 F (36.8 C)  97.9 F (36.6 C)   TempSrc: Oral  Oral   SpO2: 96% 96% 100%   Weight:   132.9 kg   Height:        Intake/Output Summary (Last 24 hours) at 03/08/2022 0713 Last data filed at 03/08/2022 0538 Gross per 24 hour  Intake 1330 ml  Output 2950 ml  Net -1620 ml       03/08/2022    5:31 AM 03/07/2022    5:44 AM 03/06/2022    5:00 AM  Last 3 Weights  Weight (lbs) 292 lb 15.9 oz  291 lb 3.6 oz 291 lb  Weight (kg) 132.9 kg 132.1 kg 131.997 kg      Telemetry    A-sensed, v-paced- Personally Reviewed  ECG    No new tracing today - Personally Reviewed  Physical Exam   GEN: Sitting up in bed, comfortable Neck: TTP at [place] site (improved from prior) Cardiac: RRR, no murmurs Respiratory: Clear bilaterally GI: Obese, soft, NTTP MS: No edema, warm Neuro:  Nonfocal  Psych: Normal affect   Labs    High Sensitivity Troponin:   Recent Labs  Lab 02/26/22 0739 02/27/22 2012 02/27/22 2256 02/28/22 1859 02/28/22 2030  TROPONINIHS 12 19* 20* 14 15      Chemistry Recent Labs  Lab 03/03/22 0228 03/04/22 0926 03/05/22 0343 03/06/22 0540 03/07/22 0333 03/08/22 0442  NA 136 134* 136 135 135 136  K 3.8 4.5 3.9 4.5 4.1 4.5  CL 101 100 101 99 99 101  CO2 26 24 26 23 24 26   GLUCOSE 196* 248* 225* 276* 163* 193*  BUN 25* 22* 29* 28* 23* 21*  CREATININE 1.08 1.14 1.26* 1.18 1.03 1.03  CALCIUM 9.0 9.1 9.1 9.5  9.0 8.9  MG 2.4 2.3 2.4 2.5*  --   --   ALBUMIN 3.7  --  3.8 4.0  --   --   GFRNONAA >60 >60 >60 >60 >60 >60  ANIONGAP 9 10 9 13 12 9      Lipids No results for input(s): "CHOL", "TRIG", "HDL", "LABVLDL", "LDLCALC", "CHOLHDL" in the last 168 hours.  Hematology Recent Labs  Lab 03/03/22 0228 03/05/22 0343 03/06/22 0540  WBC 9.0 11.3* 9.8  RBC 4.70 4.98 5.23  HGB 13.8 14.5 14.9  HCT 40.9 42.9 46.3  MCV 87.0 86.1 88.5  MCH 29.4 29.1 28.5  MCHC 33.7 33.8 32.2  RDW 12.9 13.1 13.1  PLT 268 286 297    Thyroid  Recent Labs  Lab 03/01/22 0918  TSH 0.117*  FREET4 1.11     BNP Recent Labs  Lab 03/03/22 0228  BNP 62.2     DDimer No results for input(s): "DDIMER" in the last 168 hours.   Radiology    VAS 05/03/22 UPPER EXTREMITY VENOUS DUPLEX  Result Date: 03/07/2022 UPPER VENOUS STUDY  Patient Name:  Luis [name]  Date of Exam:   03/07/2022 Medical Rec #: [id number]              Accession #:    [id number] Date of Birth: [birth year]                Patient Gender: M Patient Age:   51 years Exam Location:  [facility] Procedure:      VAS [facility] UPPER EXTREMITY VENOUS DUPLEX Referring Phys: CALLIE GOODRICH --------------------------------------------------------------------------------  Indications: Pain, Swelling, and Erythema right neck status post right heart cath through jugular vein. Physician requests to rule out large hematoma at site of concern. Limitations: Pain at site of concern, body habitus. Comparison Study: No prior study Performing Technologist: [place] RVS  Examination Guidelines: A complete evaluation includes B-mode imaging, spectral Doppler, color Doppler, and power Doppler as needed of all accessible portions of each vessel. Bilateral testing is considered an integral part of a complete examination. Limited examinations for reoccurring indications may be performed as noted.  Right Findings: +----------+------------+---------+-----------+----------+-------+ RIGHT     CompressiblePhasicitySpontaneousPropertiesSummary +----------+------------+---------+-----------+----------+-------+ IJV           Full       Yes       Yes                      +----------+------------+---------+-----------+----------+-------+ Subclavian               Yes       Yes                      +----------+------------+---------+-----------+----------+-------+ Axillary                 Yes       Yes                      +----------+------------+---------+-----------+----------+-------+  Summary:  Right: There is no DVT noted in the IJV, subclavian, or axillary veins. No hematoma noted.  *See table(s) above for measurements and observations.    Preliminary    CARDIAC CATHETERIZATION  Result Date: 03/06/2022 1.  Relatively normal filling pressures with a mean RA pressure of 8 mmHg, mean wedge pressure of 13 mmHg, mean PA pressure of 22 mmHg, with a mildly reduced Fick cardiac output of 4.28 L/min and index of 1.82 L/min/m  Cardiac Studies   RHC 03/06/22: RAP 8 mPAP 22 PCWP 13 CO 4.28 CI 1.82  TTE 02/27/2022  1. Left ventricular ejection fraction, by estimation, is 25%. The left  ventricle has severely decreased function. The left ventricle demonstrates  global hypokinesis. Cannot comment on regional wall motion abnormalities  and Definity contrast was not  attempted. Consider repeat limited echo for RWM assessment if clinically  indicated. The left ventricular internal cavity size was mildly dilated.  Left ventricular diastolic parameters are indeterminate.   2. Right ventricular systolic function is mildly reduced. The right  ventricular size is normal. Tricuspid regurgitation signal is inadequate  for assessing PA pressure.   3. The mitral valve is grossly normal. Trivial mitral valve  regurgitation. No evidence of mitral stenosis.   4. The aortic valve is grossly normal. There is mild calcification of the  aortic valve. Aortic valve regurgitation is not visualized. No aortic  stenosis is present.   5. The inferior vena cava is normal in size with greater than 50%  respiratory variability, suggesting right atrial pressure of 3 mmHg.   Patient Profile     51 y.o. male with history of nonischemic cardiomyopathy, chronic systolic heart failure status post BiV ICD, chronic respiratory failure on 2 L nasal cannula, OSA, diabetes, cocaine abuse who was admitted on 02/26/2022 for acute on chronic systolic heart failure.  Assessment & Plan    #Acute on Chronic Systolic HF, EF 25%: #Non-ischemic CM: Patient presented with worsening chest pain and SOB found to be overloaded on exam with pulmonary edema on CXR consistent with acute on chronic systolic heart failure. Has been diuresing well. Volume status difficult to assess given body habitus. Underwent RHC on 03/06/22 with relatively normal filling pressures. Now transitioned to PO lasix. -RHC with relatively normal filling pressures -Continue lasix 80mg  PO  daily -Continue coreg 12.5mg  BID  -Continue entresto 24/26mg  BID -Continue spiro 25mg  daily -Continue farxiga 10mg  daily -Discussed importance of cocaine cessation -ICD is approaching end-of-life and will need generator change to be performed by [facility]  #Pain at [facility] site: Ultrasound reassuring with no clot or hematoma.  #Sinus Tachycardia: Improved with increase in coreg -Continue coreg 12.5mg  BID; up-titrate as tolerated  #Chronic Respiratory Failure with Hypoxia: -Continue home O2 and CPAP  #OSA: -Continue CPAP  #DMII: -Management per IM  #Morbid Obesity: BMI 48.5.>47.7  Okay to discharge home from CV perspective. Will arrange follow-up.    For questions or updates, please contact [facility] Please consult [email or web address] for contact info under        Signed, , MD  03/08/2022, 7:13 AM

## 2022-03-08 NOTE — TOC Transition Note (Addendum)
Transition of Care [facility]) - CM/SW Discharge Note   Patient Details  Name: Luis [name] MRN: [id number] Date of Birth: 07-07-1970  Transition of Care [facility]) CM/SW Contact:  Lawerance Sabal, RN Phone Number: 03/08/2022, 9:09 AM   Clinical Narrative:     Notified [facility] that patient will DC to home [place]. He states that his daughter will be able to pick him up around 3pm. He would like to use [employer] for home oxygen. Requested bedside nurse to obtain ambulatory sats so new home O2 order can be processed.  [employer] notified to follow and will process when note placed      Barriers to Discharge: Continued Medical Work up   Patient Goals and CMS Choice        Discharge Placement                       Discharge Plan and Services                          HH Arranged: PT [facility] Agency: [facility] Date [facility] Agency Contacted: 02/28/22 Time HH Agency Contacted: 1000 Representative spoke with at [facility] Agency: Elnita Maxwell  Social Determinants of Health (SDOH) Interventions Food Insecurity Interventions: Intervention Not Indicated Housing Interventions: Intervention Not Indicated Transportation Interventions: Intervention Not Indicated Financial Strain Interventions: Intervention Not Indicated   Readmission Risk Interventions    01/31/2021   12:26 PM 01/31/2021    9:25 AM  Readmission Risk Prevention Plan  Transportation Screening Complete   PCP or Specialist Appt within 3-5 Days Complete Complete  HRI or Home Care Consult Complete Complete  Social Work Consult for Recovery Care Planning/Counseling Patient refused Complete  Palliative Care Screening Not Applicable Not Applicable  Medication Review Oceanographer) Complete Referral to Pharmacy

## 2022-03-09 ENCOUNTER — Encounter (HOSPITAL_COMMUNITY): Payer: Self-pay | Admitting: Internal Medicine

## 2022-03-09 LAB — LIPOPROTEIN A (LPA): Lipoprotein (a): 48.7 nmol/L — ABNORMAL HIGH (ref <75.0)

## 2022-03-09 LAB — POCT I-STAT EG7
Acid-Base Excess: 3 mmol/L — ABNORMAL HIGH (ref 0.0–2.0)
Acid-Base Excess: 4 mmol/L — ABNORMAL HIGH (ref 0.0–2.0)
Bicarbonate: 29.9 mmol/L — ABNORMAL HIGH (ref 20.0–28.0)
Bicarbonate: 30.5 mmol/L — ABNORMAL HIGH (ref 20.0–28.0)
Calcium, Ion: 1.09 mmol/L — ABNORMAL LOW (ref 1.15–1.40)
Calcium, Ion: 1.12 mmol/L — ABNORMAL LOW (ref 1.15–1.40)
HCT: 44 % (ref 39.0–52.0)
HCT: 45 % (ref 39.0–52.0)
Hemoglobin: 15 g/dL (ref 13.0–17.0)
Hemoglobin: 15.3 g/dL (ref 13.0–17.0)
O2 Saturation: 60 %
O2 Saturation: 63 %
Potassium: 4 mmol/L (ref 3.5–5.1)
Potassium: 4.1 mmol/L (ref 3.5–5.1)
Sodium: 142 mmol/L (ref 135–145)
Sodium: 143 mmol/L (ref 135–145)
TCO2: 32 mmol/L (ref 22–32)
TCO2: 32 mmol/L (ref 22–32)
pCO2, Ven: 52.9 mmHg (ref 44–60)
pCO2, Ven: 54.7 mmHg (ref 44–60)
pH, Ven: 7.346 (ref 7.25–7.43)
pH, Ven: 7.368 (ref 7.25–7.43)
pO2, Ven: 33 mmHg (ref 32–45)
pO2, Ven: 34 mmHg (ref 32–45)

## 2022-03-11 ENCOUNTER — Encounter: Payer: Self-pay | Admitting: *Deleted

## 2022-03-12 NOTE — Progress Notes (Unsigned)
Cardiology Office Note:    Date:  03/16/2022   ID:  Luis [name], DOB 29-Jul-1970, MRN [id number]  PCP:  Buzzy Han, MD (Inactive)   [facility] Providers Cardiologist:  Glenetta Hew, MD     Referring MD: No ref. provider found   Chief Complaint: hospital follow-up  History of Present Illness:    Luis [name] is a pleasant 51 y.o. male with a hx of chronic systolic heart failure, nonischemic cardiomyopathy, s/p biventricular ICD, chronic hypoxic respiratory failure on 2.5L nasal cannula, OSA on CPAP, HTN, hyperlipidemia, type 2 diabetes, substance abuse, morbid obesity  Follows at [facility] cardiology historically, seen by [facility] cardiology for inpatient consultation 2021 and 2022.  Reportedly diagnosed with nonischemic cardiomyopathy in 2012 in [place], etiology felt due to cocaine abuse.  [place] cardiology 03/07/2019 notes mention patient has CAD with history of angioplasty in the past, no PCI required.  He reported history of ventricular tachycardia requiring ICD placement and underwent generator change 06/2018. No prior office visit with our group.   Admission 8/31-9/10/23 for worsening chest pain and shortness of breath found to be overloaded on exam with pulmonary edema on CXR consistent with acute on chronic systolic heart failure.  Underwent RHC on 03/06/2022 with relatively normal filling pressures.  ICD approaching end-of-life and will need generator change to be performed by [facility].  Chronic respiratory failure with hypoxia on chronic O2 and CPAP.  Discharged home on Lasix 80 mg daily, carvedilol 12.5 mg twice daily, Entresto 24-26 mg twice daily, spironolactone 25 mg daily, and Farxiga 10 mg daily. Was advised of importance of cocaine cessation.   Today, he is here alone for follow-up. Reports he is getting light-headed and is falling down. 2 days ago he was getting out of shower and felt weak, fell to the floor. No feeling in his right foot. Did not  lose consciousness, no syncope. Reports 18 lb weight gain since hospital discharge. Drinking 3 8 oz glasses of apple juice daily but says he only drinks a total of  4 8 oz glasses of fluids daily.  Reports low-sodium diet, cooking at home. Cannot stand up to cook but uses the microwave to steamed vegetables and bakes meat in the oven. Is sleeping sitting up in a chair, feels like he is drowning when he lies down. He reports chest discomfort, shortness of breath, lower extremity edema, fatigue, weakness, presyncope, orthopnea, and PND.   Past Medical History:  Diagnosis Date   Anxiety    Biventricular ICD (implantable cardioverter-defibrillator) in place    Chronic systolic CHF (congestive heart failure) (HCC)    Cocaine use    Diabetes mellitus without complication (HCC)    GERD (gastroesophageal reflux disease)    HLD (hyperlipidemia)    Hypertension    Morbid obesity (HCC)    NICM (nonischemic cardiomyopathy) (HCC)    OSA (obstructive sleep apnea)    PTSD (post-traumatic stress disorder)    on Depakote   Refusal of blood transfusions as patient is Jehovah's Witness    Tobacco abuse     Past Surgical History:  Procedure Laterality Date   BIV ICD INSERTION CRT-D     FOOT FRACTURE SURGERY     RIGHT HEART CATH N/A 03/06/2022   Procedure: RIGHT HEART CATH;  Surgeon: Early Osmond, MD;  Location: [place] [facility];  Service: Cardiovascular;  Laterality: N/A;   ROTATOR CUFF REPAIR     TONSILLECTOMY      Current Medications: Current Meds  Medication  Sig   carvedilol (COREG) 12.5 MG tablet Take 1 tablet (12.5 mg total) by mouth 2 (two) times daily with a meal.   clopidogrel (PLAVIX) 75 MG tablet Take 1 tablet (75 mg total) by mouth daily.   cyclobenzaprine (FLEXERIL) 10 MG tablet Take 10 mg by mouth 3 (three) times daily.   dapagliflozin propanediol (FARXIGA) 10 MG TABS tablet Take 1 tablet (10 mg total) by mouth daily.   DULoxetine (CYMBALTA) 30 MG capsule Take 3 capsules (90 mg  total) by mouth daily.   furosemide (LASIX) 80 MG tablet Take 1 tablet (80 mg total) by mouth daily.   hydrOXYzine (ATARAX) 25 MG tablet Take 1 tablet (25 mg total) by mouth 3 (three) times daily as needed for anxiety.   Insulin Lispro Prot & Lispro (HUMALOG MIX 75/25 KWIKPEN) (75-25) 100 UNIT/ML Kwikpen Inject 70 Units into the skin 3 (three) times daily.   LANTUS SOLOSTAR 100 UNIT/ML Solostar Pen Inject 50 Units into the skin 2 (two) times daily.   neomycin-polymyxin-hydrocortisone (CORTISPORIN) 3.5-10000-1 OTIC suspension Place 4 drops into the left ear 4 (four) times daily. 10 day course.   OXYGEN Inhale 3 L into the lungs continuous.   pantoprazole (PROTONIX) 40 MG tablet Take 1 tablet (40 mg total) by mouth daily.   prazosin (MINIPRESS) 2 MG capsule Take 1 capsule (2 mg total) by mouth at bedtime.   rosuvastatin (CRESTOR) 20 MG tablet Take 1 tablet (20 mg total) by mouth daily.   sacubitril-valsartan (ENTRESTO) 24-26 MG Take 1 tablet by mouth 2 (two) times daily.   spironolactone (ALDACTONE) 25 MG tablet Take 1 tablet (25 mg total) by mouth daily.   terbinafine (LAMISIL) 1 % cream Apply topically 2 (two) times daily.   traZODone (DESYREL) 100 MG tablet Take 2 tablets (200 mg total) by mouth at bedtime.   TRULICITY 1.5 MG/0.5ML SOPN Inject 1.5 mg into the skin once a week. (Patient taking differently: Inject 1.5 mg into the skin every Friday.)     Allergies:   Aspirin, Iodine-131, Iodinated contrast media, Latex, Penicillins, Ultram [tramadol], Augmentin [amoxicillin-pot clavulanate], Lidocaine, and Zestril [lisinopril]   Social History   Socioeconomic History   Marital status: Widowed    Spouse name: Not on file   Number of children: 7   Years of education: Not on file   Highest education level: Bachelor's degree (e.g., BA, AB, BS)  Occupational History   Occupation: disability  Tobacco Use   Smoking status: Every Day    Packs/day: 0.20    Types: Cigarettes   Smokeless tobacco:  Never   Tobacco comments:    occasionally  Vaping Use   Vaping Use: Never used  Substance and Sexual Activity   Alcohol use: Not Currently   Drug use: Yes    Types: Cocaine   Sexual activity: Not Currently  Other Topics Concern   Not on file  Social History Narrative   Not on file   Social Determinants of Health   Financial Resource Strain: Low Risk  (02/26/2022)   Overall Financial Resource Strain (CARDIA)    Difficulty of Paying Living Expenses: Not very hard  Food Insecurity: No Food Insecurity (02/26/2022)   Hunger Vital Sign    Worried About Running Out of Food in the Last Year: Never true    Ran Out of Food in the Last Year: Never true  Transportation Needs: No Transportation Needs (02/26/2022)   PRAPARE - Transportation    Lack of Transportation (Medical): No    Lack of  Transportation (Non-Medical): No  Physical Activity: Not on file  Stress: Not on file  Social Connections: Not on file     Family History: The patient's family history includes Bone cancer in his father; Hypertension in his mother; Lung cancer in his father.  ROS:   Please see the history of present illness.    + DOE +orthopnea All other systems reviewed and are negative.  Labs/Other Studies Reviewed:    The following studies were reviewed today:  RHC 03/06/22  1. Relatively normal filling pressures with a mean RA pressure of 8 mmHg, mean wedge pressure of 13 mmHg, mean PA pressure of 22 mmHg, with a mildly reduced Fick cardiac output of 4.28 L/min and index of 1.82 L/min/m   Echo 02/27/22  1. Left ventricular ejection fraction, by estimation, is 25%. The left  ventricle has severely decreased function. The left ventricle demonstrates  global hypokinesis. Cannot comment on regional wall motion abnormalities  and Definity contrast was not  attempted. Consider repeat limited echo for RWM assessment if clinically  indicated. The left ventricular internal cavity size was mildly dilated.  Left  ventricular diastolic parameters are indeterminate.   2. Right ventricular systolic function is mildly reduced. The right  ventricular size is normal. Tricuspid regurgitation signal is inadequate  for assessing PA pressure.   3. The mitral valve is grossly normal. Trivial mitral valve  regurgitation. No evidence of mitral stenosis.   4. The aortic valve is grossly normal. There is mild calcification of the  aortic valve. Aortic valve regurgitation is not visualized. No aortic  stenosis is present.   5. The inferior vena cava is normal in size with greater than 50%  respiratory variability, suggesting right atrial pressure of 3 mmHg.   Coronary CTA 01/19/19  IMPRESSION: 1. Coronary calcium not possible to assess secondary to artifact with BiV ICD leads, however no calcifications seen in the visualized portions of coronary arteries.   2. Normal coronary origin with left dominance.   3. This study is of very limited quality with significant artifact from leads of biventricular pacemaker. There is no obvious plaque in the visualized portions of coronary arteries - LM, proximal, mid LAD, proximal and mid portion of LCX artery and PDA.  Recent Labs: 02/27/2022: ALT 20 03/01/2022: TSH 0.117 03/03/2022: B Natriuretic Peptide 62.2 03/06/2022: Magnesium 2.5 03/08/2022: BUN 21; Creatinine, Ser 1.03; Potassium 4.5; Sodium 136 03/16/2022: Hemoglobin 13.3; Platelets 251  Recent Lipid Panel    Component Value Date/Time   CHOL 143 08/05/2021 1230   TRIG 245 (H) 08/05/2021 1230   HDL 33 (L) 08/05/2021 1230   CHOLHDL 4.3 08/05/2021 1230   VLDL 49 (H) 08/05/2021 1230   LDLCALC 61 08/05/2021 1230     Risk Assessment/Calculations:       Physical Exam:    VS:  BP 128/72 (BP Location: Left Arm, Cuff Size: Large)   Pulse 85   Ht 5' 5.5" (1.664 m)   Wt (!) 310 lb (140.6 kg)   SpO2 98%   BMI 50.80 kg/m     Wt Readings from Last 3 Encounters:  03/16/22 (!) 310 lb (140.6 kg)  03/16/22 (!) 310  lb (140.6 kg)  03/08/22 292 lb 15.9 oz (132.9 kg)     GEN: Well developed, obese male in mild distress HEENT: Normal NECK: JVD present; No carotid bruits CARDIAC: RRR, no murmurs, rubs, gallops RESPIRATORY:  Clear to auscultation without rales, wheezing or rhonchi  ABDOMEN: Distended, firm to palpation MUSCULOSKELETAL:  Mild bilateral  LE edema; No deformity. 2+ pedal pulses, equal bilaterally SKIN: Warm and dry NEUROLOGIC:  Alert and oriented x 3 PSYCHIATRIC:  Normal affect    Diagnoses:    1. Acute on chronic combined systolic and diastolic CHF (congestive heart failure) ([place])   2. ICD (implantable cardioverter-defibrillator) in place   3. Chronic respiratory failure with hypoxia (HCC) - 2.5 L/min    Assessment and Plan:     Acute on chronic combined CHF/NICM: LVEF 25% on echo 02/27/22. DISH 9/8 revealed relatively normal filling pressures with mean RA pressure of 8 mmHg, mean wedge pressure 13 mmHg, mean PA pressure 22 mmHg with a mildly reduced Fick cardiac output.  Weight increased 18 pounds in 8 days. Appears volume overloaded on exam. Tried additional 80 mg Lasix with little improvement in symptoms at home. Reports severe chest discomfort, difficulty breathing, orthopnea. Mild bilateral LE edema. Discussed with Dr. Irish Lack, [place] who advised hospital admission for fluid management.  Patient arrived at office visit via [place] car service. Will send to ED by EMS to await admission.   ICD implant: Device previously followed by [facility] approaching end of life, will need generator change. Would like to establish with our EP providers.   Chronic respiratory failure with hypoxia: No evidence of severe respiratory distress. Continue chronic O2.      Disposition: To [facility] ED for admission  Medication Adjustments/Labs and Tests Ordered: Current medicines are reviewed at length with the patient today.  Concerns regarding medicines are outlined above.  Orders Placed This Encounter  Procedures    EKG 12-Lead   No orders of the defined types were placed in this encounter.   There are no Patient Instructions on file for this visit.   Signed, Emmaline Life, NP  03/16/2022 4:56 PM    [place]

## 2022-03-16 ENCOUNTER — Other Ambulatory Visit: Payer: Self-pay

## 2022-03-16 ENCOUNTER — Inpatient Hospital Stay (HOSPITAL_COMMUNITY)
Admission: EM | Admit: 2022-03-16 | Discharge: 2022-03-25 | DRG: 245 | Disposition: A | Discharge status: Home Health | Payer: Medicare Other | Source: Ambulatory Visit | Attending: Cardiology | Admitting: Cardiology

## 2022-03-16 ENCOUNTER — Encounter: Payer: Self-pay | Admitting: Nurse Practitioner

## 2022-03-16 ENCOUNTER — Encounter (HOSPITAL_COMMUNITY): Payer: Self-pay

## 2022-03-16 ENCOUNTER — Ambulatory Visit: Payer: Medicare Other | Attending: Nurse Practitioner | Admitting: Nurse Practitioner

## 2022-03-16 ENCOUNTER — Inpatient Hospital Stay (HOSPITAL_COMMUNITY): Payer: Medicare Other

## 2022-03-16 VITALS — BP 128/72 | HR 85 | Ht 65.5 in | Wt 310.0 lb

## 2022-03-16 DIAGNOSIS — Z801 Family history of malignant neoplasm of trachea, bronchus and lung: Secondary | ICD-10-CM

## 2022-03-16 DIAGNOSIS — Z8679 Personal history of other diseases of the circulatory system: Secondary | ICD-10-CM | POA: Diagnosis not present

## 2022-03-16 DIAGNOSIS — R296 Repeated falls: Secondary | ICD-10-CM | POA: Diagnosis present

## 2022-03-16 DIAGNOSIS — L299 Pruritus, unspecified: Secondary | ICD-10-CM | POA: Diagnosis not present

## 2022-03-16 DIAGNOSIS — G4733 Obstructive sleep apnea (adult) (pediatric): Secondary | ICD-10-CM | POA: Diagnosis present

## 2022-03-16 DIAGNOSIS — I110 Hypertensive heart disease with heart failure: Principal | ICD-10-CM | POA: Diagnosis present

## 2022-03-16 DIAGNOSIS — Z95810 Presence of automatic (implantable) cardiac defibrillator: Secondary | ICD-10-CM

## 2022-03-16 DIAGNOSIS — Z9981 Dependence on supplemental oxygen: Secondary | ICD-10-CM

## 2022-03-16 DIAGNOSIS — E118 Type 2 diabetes mellitus with unspecified complications: Secondary | ICD-10-CM

## 2022-03-16 DIAGNOSIS — I5023 Acute on chronic systolic (congestive) heart failure: Secondary | ICD-10-CM | POA: Diagnosis not present

## 2022-03-16 DIAGNOSIS — I509 Heart failure, unspecified: Principal | ICD-10-CM

## 2022-03-16 DIAGNOSIS — Z6841 Body Mass Index (BMI) 40.0 and over, adult: Secondary | ICD-10-CM | POA: Diagnosis not present

## 2022-03-16 DIAGNOSIS — Z79899 Other long term (current) drug therapy: Secondary | ICD-10-CM

## 2022-03-16 DIAGNOSIS — Z794 Long term (current) use of insulin: Secondary | ICD-10-CM

## 2022-03-16 DIAGNOSIS — Z885 Allergy status to narcotic agent status: Secondary | ICD-10-CM

## 2022-03-16 DIAGNOSIS — Z91041 Radiographic dye allergy status: Secondary | ICD-10-CM

## 2022-03-16 DIAGNOSIS — E119 Type 2 diabetes mellitus without complications: Secondary | ICD-10-CM | POA: Diagnosis not present

## 2022-03-16 DIAGNOSIS — Z888 Allergy status to other drugs, medicaments and biological substances status: Secondary | ICD-10-CM

## 2022-03-16 DIAGNOSIS — K219 Gastro-esophageal reflux disease without esophagitis: Secondary | ICD-10-CM | POA: Diagnosis present

## 2022-03-16 DIAGNOSIS — I428 Other cardiomyopathies: Secondary | ICD-10-CM

## 2022-03-16 DIAGNOSIS — Z7985 Long-term (current) use of injectable non-insulin antidiabetic drugs: Secondary | ICD-10-CM | POA: Diagnosis not present

## 2022-03-16 DIAGNOSIS — Z7984 Long term (current) use of oral hypoglycemic drugs: Secondary | ICD-10-CM

## 2022-03-16 DIAGNOSIS — J9611 Chronic respiratory failure with hypoxia: Secondary | ICD-10-CM | POA: Diagnosis present

## 2022-03-16 DIAGNOSIS — Z886 Allergy status to analgesic agent status: Secondary | ICD-10-CM

## 2022-03-16 DIAGNOSIS — Z91040 Latex allergy status: Secondary | ICD-10-CM

## 2022-03-16 DIAGNOSIS — Z7902 Long term (current) use of antithrombotics/antiplatelets: Secondary | ICD-10-CM | POA: Diagnosis not present

## 2022-03-16 DIAGNOSIS — Z87892 Personal history of anaphylaxis: Secondary | ICD-10-CM

## 2022-03-16 DIAGNOSIS — I2510 Atherosclerotic heart disease of native coronary artery without angina pectoris: Secondary | ICD-10-CM | POA: Diagnosis present

## 2022-03-16 DIAGNOSIS — Z881 Allergy status to other antibiotic agents status: Secondary | ICD-10-CM

## 2022-03-16 DIAGNOSIS — Z4502 Encounter for adjustment and management of automatic implantable cardiac defibrillator: Secondary | ICD-10-CM | POA: Diagnosis not present

## 2022-03-16 DIAGNOSIS — I5022 Chronic systolic (congestive) heart failure: Secondary | ICD-10-CM

## 2022-03-16 DIAGNOSIS — Z8249 Family history of ischemic heart disease and other diseases of the circulatory system: Secondary | ICD-10-CM

## 2022-03-16 DIAGNOSIS — R052 Subacute cough: Secondary | ICD-10-CM | POA: Diagnosis not present

## 2022-03-16 DIAGNOSIS — W19XXXA Unspecified fall, initial encounter: Secondary | ICD-10-CM

## 2022-03-16 DIAGNOSIS — F1410 Cocaine abuse, uncomplicated: Secondary | ICD-10-CM | POA: Diagnosis present

## 2022-03-16 DIAGNOSIS — E785 Hyperlipidemia, unspecified: Secondary | ICD-10-CM | POA: Diagnosis present

## 2022-03-16 DIAGNOSIS — F419 Anxiety disorder, unspecified: Secondary | ICD-10-CM | POA: Diagnosis present

## 2022-03-16 DIAGNOSIS — Z87891 Personal history of nicotine dependence: Secondary | ICD-10-CM

## 2022-03-16 DIAGNOSIS — I5043 Acute on chronic combined systolic (congestive) and diastolic (congestive) heart failure: Secondary | ICD-10-CM | POA: Diagnosis not present

## 2022-03-16 DIAGNOSIS — I10 Essential (primary) hypertension: Secondary | ICD-10-CM | POA: Diagnosis present

## 2022-03-16 DIAGNOSIS — Z8673 Personal history of transient ischemic attack (TIA), and cerebral infarction without residual deficits: Secondary | ICD-10-CM

## 2022-03-16 DIAGNOSIS — L293 Anogenital pruritus, unspecified: Secondary | ICD-10-CM | POA: Diagnosis not present

## 2022-03-16 DIAGNOSIS — R42 Dizziness and giddiness: Secondary | ICD-10-CM

## 2022-03-16 LAB — GLUCOSE, CAPILLARY
Glucose-Capillary: 96 mg/dL (ref 70–99)
Glucose-Capillary: 135 mg/dL — ABNORMAL HIGH (ref 70–99)

## 2022-03-16 LAB — CBC WITH DIFFERENTIAL/PLATELET
Abs Immature Granulocytes: 0.04 10*3/uL (ref 0.00–0.07)
Basophils Absolute: 0 10*3/uL (ref 0.0–0.1)
Basophils Relative: 0 %
Eosinophils Absolute: 0.1 10*3/uL (ref 0.0–0.5)
Eosinophils Relative: 1 %
HCT: 40.6 % (ref 39.0–52.0)
Hemoglobin: 13.3 g/dL (ref 13.0–17.0)
Immature Granulocytes: 0 %
Lymphocytes Relative: 28 %
Lymphs Abs: 2.7 10*3/uL (ref 0.7–4.0)
MCH: 29.4 pg (ref 26.0–34.0)
MCHC: 32.8 g/dL (ref 30.0–36.0)
MCV: 89.6 fL (ref 80.0–100.0)
Monocytes Absolute: 0.9 10*3/uL (ref 0.1–1.0)
Monocytes Relative: 9 %
Neutro Abs: 5.9 10*3/uL (ref 1.7–7.7)
Neutrophils Relative %: 62 %
Platelets: 251 10*3/uL (ref 150–400)
RBC: 4.53 MIL/uL (ref 4.22–5.81)
RDW: 13.5 % (ref 11.5–15.5)
WBC: 9.7 10*3/uL (ref 4.0–10.5)
nRBC: 0 % (ref 0.0–0.2)

## 2022-03-16 LAB — BASIC METABOLIC PANEL
Anion gap: 6 (ref 5–15)
BUN: 19 mg/dL (ref 6–20)
CO2: 26 mmol/L (ref 22–32)
Calcium: 9 mg/dL (ref 8.9–10.3)
Chloride: 107 mmol/L (ref 98–111)
Creatinine, Ser: 0.83 mg/dL (ref 0.61–1.24)
GFR, Estimated: >60 mL/min (ref >60)
Glucose, Bld: 99 mg/dL (ref 70–99)
Potassium: 4 mmol/L (ref 3.5–5.1)
Sodium: 139 mmol/L (ref 135–145)

## 2022-03-16 LAB — BRAIN NATRIURETIC PEPTIDE
B Natriuretic Peptide: 103.9 pg/mL — ABNORMAL HIGH (ref 0.0–100.0)
B Natriuretic Peptide: 93.3 pg/mL (ref 0.0–100.0)

## 2022-03-16 MED ORDER — SPIRONOLACTONE 25 MG PO TABS
25.0000 mg | ORAL_TABLET | Freq: Every day | ORAL | Status: DC
  Administered 2022-03-17 – 2022-03-25 (×9): 25 mg via ORAL
  Filled 2022-03-16 (×9): qty 1

## 2022-03-16 MED ORDER — TRAZODONE HCL 100 MG PO TABS
200.0000 mg | ORAL_TABLET | Freq: Every day | ORAL | Status: DC
  Administered 2022-03-16 – 2022-03-24 (×9): 200 mg via ORAL
  Filled 2022-03-16 (×9): qty 2

## 2022-03-16 MED ORDER — CARVEDILOL 12.5 MG PO TABS
12.5000 mg | ORAL_TABLET | Freq: Two times a day (BID) | ORAL | Status: DC
  Administered 2022-03-17 – 2022-03-24 (×15): 12.5 mg via ORAL
  Filled 2022-03-16 (×16): qty 1

## 2022-03-16 MED ORDER — FUROSEMIDE 10 MG/ML IJ SOLN
40.0000 mg | Freq: Two times a day (BID) | INTRAMUSCULAR | Status: DC
  Administered 2022-03-16 – 2022-03-20 (×8): 40 mg via INTRAVENOUS
  Filled 2022-03-16 (×8): qty 4

## 2022-03-16 MED ORDER — ENOXAPARIN SODIUM 80 MG/0.8ML IJ SOSY
0.5000 mg/kg | PREFILLED_SYRINGE | INTRAMUSCULAR | Status: DC
  Filled 2022-03-16 (×5): qty 0.8
  Filled 2022-03-16: qty 0.7
  Filled 2022-03-16: qty 0.8

## 2022-03-16 MED ORDER — ROSUVASTATIN CALCIUM 20 MG PO TABS
20.0000 mg | ORAL_TABLET | Freq: Every day | ORAL | Status: DC
  Administered 2022-03-17 – 2022-03-25 (×9): 20 mg via ORAL
  Filled 2022-03-16 (×9): qty 1

## 2022-03-16 MED ORDER — DULOXETINE HCL 60 MG PO CPEP
90.0000 mg | ORAL_CAPSULE | Freq: Every day | ORAL | Status: DC
  Administered 2022-03-17 – 2022-03-25 (×9): 90 mg via ORAL
  Filled 2022-03-16 (×9): qty 1

## 2022-03-16 MED ORDER — PANTOPRAZOLE SODIUM 40 MG PO TBEC
40.0000 mg | DELAYED_RELEASE_TABLET | Freq: Every day | ORAL | Status: DC
  Administered 2022-03-17 – 2022-03-25 (×9): 40 mg via ORAL
  Filled 2022-03-16 (×9): qty 1

## 2022-03-16 MED ORDER — ENOXAPARIN SODIUM 40 MG/0.4ML IJ SOSY: 40.0000 mg | PREFILLED_SYRINGE | INTRAMUSCULAR | Status: DC

## 2022-03-16 MED ORDER — SACUBITRIL-VALSARTAN 24-26 MG PO TABS
1.0000 | ORAL_TABLET | Freq: Two times a day (BID) | ORAL | Status: DC
  Administered 2022-03-16 – 2022-03-25 (×18): 1 via ORAL
  Filled 2022-03-16 (×18): qty 1

## 2022-03-16 MED ORDER — ACETAMINOPHEN 325 MG PO TABS
650.0000 mg | ORAL_TABLET | ORAL | Status: DC | PRN
  Administered 2022-03-19 – 2022-03-23 (×3): 650 mg via ORAL
  Filled 2022-03-16 (×3): qty 2

## 2022-03-16 MED ORDER — SODIUM CHLORIDE 0.9 % IV SOLN: 250.0000 mL | INTRAVENOUS | Status: DC | PRN

## 2022-03-16 MED ORDER — SODIUM CHLORIDE 0.9% FLUSH: 3.0000 mL | INTRAVENOUS | Status: DC | PRN

## 2022-03-16 MED ORDER — CLOPIDOGREL BISULFATE 75 MG PO TABS
75.0000 mg | ORAL_TABLET | Freq: Every day | ORAL | Status: DC
  Administered 2022-03-17 – 2022-03-22 (×6): 75 mg via ORAL
  Filled 2022-03-16 (×7): qty 1

## 2022-03-16 MED ORDER — INSULIN ASPART 100 UNIT/ML IJ SOLN
0.0000 [IU] | Freq: Three times a day (TID) | INTRAMUSCULAR | Status: DC
  Administered 2022-03-17: 8 [IU] via SUBCUTANEOUS
  Administered 2022-03-17: 5 [IU] via SUBCUTANEOUS
  Administered 2022-03-17 – 2022-03-18 (×3): 3 [IU] via SUBCUTANEOUS
  Administered 2022-03-18: 5 [IU] via SUBCUTANEOUS
  Administered 2022-03-19: 3 [IU] via SUBCUTANEOUS

## 2022-03-16 MED ORDER — SODIUM CHLORIDE 0.9% FLUSH
3.0000 mL | Freq: Two times a day (BID) | INTRAVENOUS | Status: DC
  Administered 2022-03-16 – 2022-03-25 (×18): 3 mL via INTRAVENOUS

## 2022-03-16 MED ORDER — PRAZOSIN HCL 2 MG PO CAPS
2.0000 mg | ORAL_CAPSULE | Freq: Every day | ORAL | Status: DC
  Administered 2022-03-16 – 2022-03-24 (×9): 2 mg via ORAL
  Filled 2022-03-16 (×11): qty 1

## 2022-03-16 MED ORDER — DAPAGLIFLOZIN PROPANEDIOL 10 MG PO TABS
10.0000 mg | ORAL_TABLET | Freq: Every day | ORAL | Status: DC
  Administered 2022-03-17 – 2022-03-25 (×9): 10 mg via ORAL
  Filled 2022-03-16 (×9): qty 1

## 2022-03-16 MED ORDER — HYDROXYZINE HCL 25 MG PO TABS
25.0000 mg | ORAL_TABLET | Freq: Three times a day (TID) | ORAL | Status: DC | PRN
  Administered 2022-03-19 – 2022-03-20 (×2): 25 mg via ORAL
  Filled 2022-03-16 (×2): qty 1

## 2022-03-16 NOTE — ED Provider Notes (Signed)
[facility] DEPART[facility] Provider Note   CSN: [id number] Arrival date & time: 03/16/22  1543     History  Chief Complaint  Patient presents with   Shortness of Breath    Luis [name] is a 51 y.o. male history of CHF, polysubstance use, ICD here for evaluation of shortness of breath.  Recently discharged from the cardiology service for fluid overload.  Was seen in follow-up today had noted 18 pound weight gain.  He wears chronic oxygen.  Is having to sleep upright in a recliner.  Admits to PND orthopnea.  No chest pain. No sick contacts, cough.  HPI     Home Medications Prior to Admission medications   Medication Sig Start Date End Date Taking? Authorizing Provider  carvedilol (COREG) 12.5 MG tablet Take 1 tablet (12.5 mg total) by mouth 2 (two) times daily with a meal. 03/08/22   Patrecia Pour, MD  clopidogrel (PLAVIX) 75 MG tablet Take 1 tablet (75 mg total) by mouth daily. 08/22/21   Clapacs, Madie Reno, MD  cyclobenzaprine (FLEXERIL) 10 MG tablet Take 10 mg by mouth 3 (three) times daily. 01/20/22   [provider]  dapagliflozin propanediol (FARXIGA) 10 MG TABS tablet Take 1 tablet (10 mg total) by mouth daily. 03/08/22   Patrecia Pour, MD  DULoxetine (CYMBALTA) 30 MG capsule Take 3 capsules (90 mg total) by mouth daily. 09/03/21   Nwoko, Terese Door, PA  furosemide (LASIX) 80 MG tablet Take 1 tablet (80 mg total) by mouth daily. 08/21/21   Clapacs, Madie Reno, MD  hydrOXYzine (ATARAX) 25 MG tablet Take 1 tablet (25 mg total) by mouth 3 (three) times daily as needed for anxiety. 09/03/21   Nwoko, Terese Door, PA  Insulin Lispro Prot & Lispro (HUMALOG MIX 75/25 KWIKPEN) (75-25) 100 UNIT/ML Kwikpen Inject 70 Units into the skin 3 (three) times daily.    [provider]  LANTUS SOLOSTAR 100 UNIT/ML Solostar Pen Inject 50 Units into the skin 2 (two) times daily. 01/12/22   [provider]  neomycin-polymyxin-hydrocortisone (CORTISPORIN)  3.5-10000-1 OTIC suspension Place 4 drops into the left ear 4 (four) times daily. 10 day course. 02/19/22   [provider]  OXYGEN Inhale 3 L into the lungs continuous.    [provider]  pantoprazole (PROTONIX) 40 MG tablet Take 1 tablet (40 mg total) by mouth daily. 08/21/21   Clapacs, Madie Reno, MD  prazosin (MINIPRESS) 2 MG capsule Take 1 capsule (2 mg total) by mouth at bedtime. 09/03/21   Nwoko, Terese Door, PA  rosuvastatin (CRESTOR) 20 MG tablet Take 1 tablet (20 mg total) by mouth daily. 08/22/21   Clapacs, Madie Reno, MD  sacubitril-valsartan (ENTRESTO) 24-26 MG Take 1 tablet by mouth 2 (two) times daily. 03/08/22   Patrecia Pour, MD  spironolactone (ALDACTONE) 25 MG tablet Take 1 tablet (25 mg total) by mouth daily. 03/08/22   Patrecia Pour, MD  terbinafine (LAMISIL) 1 % cream Apply topically 2 (two) times daily. 03/08/22   Patrecia Pour, MD  traZODone (DESYREL) 100 MG tablet Take 2 tablets (200 mg total) by mouth at bedtime. 09/03/21   Nwoko, Isidoro Donning E, PA  TRULICITY 1.5 0000000 SOPN Inject 1.5 mg into the skin once a week. Patient taking differently: Inject 1.5 mg into the skin every Friday. 08/21/21   Clapacs, Madie Reno, MD      Allergies    Aspirin, Iodine-131, Iodinated contrast media, Latex, Penicillins, Ultram [tramadol], Augmentin [amoxicillin-pot clavulanate],  Lidocaine, and Zestril [lisinopril]    Review of Systems   Review of Systems  Constitutional: Negative.   HENT: Negative.    Respiratory:  Positive for shortness of breath. Negative for apnea, cough, choking, chest tightness, wheezing and stridor.   Cardiovascular: Negative.   Gastrointestinal: Negative.   Genitourinary: Negative.   Musculoskeletal: Negative.   Skin: Negative.   Neurological: Negative.   All other systems reviewed and are negative.   Physical Exam Updated Vital Signs BP 137/82 (BP Location: Left Arm)   Pulse 88   Temp 98.1 F (36.7 C) (Oral)   Resp 15   Ht 5\' 6"  (1.676 m)   Wt (!) 138.7  kg   SpO2 99%   BMI 49.35 kg/m  Physical Exam Vitals and nursing note reviewed.  Constitutional:      General: He is not in acute distress.    Appearance: He is well-developed. He is not ill-appearing, toxic-appearing or diaphoretic.  HENT:     Head: Atraumatic.     Mouth/Throat:     Mouth: Mucous membranes are moist.  Eyes:     Pupils: Pupils are equal, round, and reactive to light.  Cardiovascular:     Rate and Rhythm: Normal rate and regular rhythm.     Pulses: Normal pulses.     Heart sounds: Normal heart sounds.  Pulmonary:     Effort: Pulmonary effort is normal. No respiratory distress.     Breath sounds: Normal breath sounds.     Comments: On [place] Abdominal:     General: There is no distension.     Palpations: Abdomen is soft.  Musculoskeletal:        General: Normal range of motion.     Cervical back: Normal range of motion and neck supple.     Right lower leg: No tenderness. No edema.     Left lower leg: No tenderness. No edema.     Comments: No bony tenderness, full range of motion, trace pitting edema lower extremities  Skin:    General: Skin is warm and dry.  Neurological:     General: No focal deficit present.     Mental Status: He is alert and oriented to person, place, and time.     ED Results / Procedures / Treatments   Labs (all labs ordered are listed, but only abnormal results are displayed) Labs Reviewed  BRAIN NATRIURETIC PEPTIDE - Abnormal; Notable for the following components:      Result Value   B Natriuretic Peptide 103.9 (*)    All other components within normal limits  CBC WITH DIFFERENTIAL/PLATELET  BASIC METABOLIC PANEL  GLUCOSE, CAPILLARY  BRAIN NATRIURETIC PEPTIDE  BASIC METABOLIC PANEL    EKG None  Radiology DG Chest 2 View  Result Date: 03/16/2022 CLINICAL DATA:  Shortness of breath, history of CHF EXAM: CHEST - 2 VIEW COMPARISON:  02/26/2022 FINDINGS: Unchanged cardiomegaly. Unchanged appearance of left chest cardiac device  and leads. No focal pulmonary opacity. No pleural effusion or pneumothorax. No acute osseous abnormality. IMPRESSION: Cardiomegaly without additional acute cardiopulmonary process. Electronically Signed   By: Merilyn Baba M.D.   On: 03/16/2022 16:54    Procedures Procedures    Medications Ordered in ED Medications  sodium chloride flush (NS) 0.9 % injection 3 mL (has no administration in time range)  sodium chloride flush (NS) 0.9 % injection 3 mL (has no administration in time range)  0.9 %  sodium chloride infusion (has no administration in time range)  acetaminophen (  TYLENOL) tablet 650 mg (has no administration in time range)  furosemide (LASIX) injection 40 mg (has no administration in time range)  sacubitril-valsartan (ENTRESTO) 24-26 mg per tablet (has no administration in time range)  carvedilol (COREG) tablet 12.5 mg (has no administration in time range)  dapagliflozin propanediol (FARXIGA) tablet 10 mg (10 mg Oral Not Given 03/16/22 1845)  insulin aspart (novoLOG) injection 0-15 Units ( Subcutaneous Not Given 03/16/22 1846)  enoxaparin (LOVENOX) injection 70 mg (has no administration in time range)   ED Course/ Medical Decision Making/ A&P    51 year old history of substance use, CHF, chronic hypoxic respiratory failure, ICD in place here for evaluation of fluid overload.  Recently discharged for similar.  Seen by cardiology in the outpatient setting noted to have 18 pound weight gain since discharge over the last week.  Admits to PND orthopnea.  Sounds fluid overloaded.  No unilateral swelling, neurovascularly intact.  No increase in home oxygen requirement.  Cardiology sent patient here for admission.  Labs and imaging personally viewed and interpreted:  CBC without leukocytosis Metabolic panel without significant normality BNP 103 Chest x-ray with cardiomegaly EKG without ischemic changes, not pulling over into epic.   Prior EF 25 % on 02/27/22  Cardiology has placed  admission orders.  He will be admitted to the cardiology team for further management.  The patient appears reasonably stabilized for admission considering the current resources, flow, and capabilities available in the ED at this time, and I doubt any other [facility] requiring further screening and/or treatment in the ED prior to admission.                           Medical Decision Making Amount and/or Complexity of Data Reviewed External Data Reviewed: labs, radiology, ECG and notes. Labs: ordered. Decision-making details documented in ED Course. Radiology: ordered and independent interpretation performed. Decision-making details documented in ED Course. ECG/medicine tests: ordered and independent interpretation performed. Decision-making details documented in ED Course.  Risk OTC drugs. Prescription drug management. Decision regarding hospitalization. Diagnosis or treatment significantly limited by social determinants of health.          Final Clinical Impression(s) / ED Diagnoses Final diagnoses:  Acute on chronic congestive heart failure, unspecified heart failure type ([place])  Chronic respiratory failure with hypoxia [facility])    Rx / DC Orders ED Discharge Orders     None         Schelly Chuba A, PA-C 03/16/22 1939    Gareth Morgan, MD 03/17/22 1001

## 2022-03-16 NOTE — Plan of Care (Signed)
  Problem: Activity: Goal: Ability to return to baseline activity level will improve Outcome: Progressing   Problem: Clinical Measurements: Goal: Respiratory complications will improve Outcome: Progressing   Problem: Activity: Goal: Risk for activity intolerance will decrease Outcome: Progressing

## 2022-03-16 NOTE — ED Notes (Signed)
Failed attempt to collect labs   

## 2022-03-16 NOTE — H&P (Cosign Needed)
Cardiology Admission History and Physical   Patient ID: Luis [name] MRN: [id number]; DOB: 14-Dec-1970   Admission date: 03/16/2022  PCP:  Margot Ables, MD (Inactive)   [facility] Providers Cardiologist:  Bryan Lemma, MD        Chief Complaint:  acute on chronic combined CHF  Patient Profile:   Luis [name] is a 51 y.o. male  who is being seen 03/16/2022 for the evaluation of CHF.  History of Present Illness:   Luis [name] is a pleasant 51 y.o. male with a hx of chronic systolic heart failure, nonischemic cardiomyopathy, s/p biventricular ICD, chronic hypoxic respiratory failure on 2.5L nasal cannula, OSA on CPAP, HTN, hyperlipidemia, type 2 diabetes, substance abuse, morbid obesity  Follows at [facility] cardiology historically, seen by [facility] cardiology for inpatient consultation 2021 and 2022.  Reportedly diagnosed with nonischemic cardiomyopathy in 2012 in [place], etiology felt due to cocaine abuse.  [facility] cardiology 03/07/2019 notes mention patient has CAD with history of angioplasty in the past, no PCI required.  He reported history of ventricular tachycardia requiring ICD placement and underwent generator change 06/2018. No prior office visit with our group.   Admission 8/31-9/10/23 for worsening chest pain and shortness of breath found to be overloaded on exam with pulmonary edema on CXR consistent with acute on chronic systolic heart failure.  Underwent RHC on 03/06/2022 with relatively normal filling pressures.  ICD approaching end-of-life and will need generator change to be performed by [facility].  Chronic respiratory failure with hypoxia on chronic O2 and CPAP.  Discharged home on Lasix 80 mg daily, carvedilol 12.5 mg twice daily, Entresto 24-26 mg twice daily, spironolactone 25 mg daily, and Farxiga 10 mg daily. Was advised of importance of cocaine cessation.   Today, he is here alone for follow-up. Reports he is getting light-headed and  is falling down. 2 days ago he was getting out of shower and felt weak, fell to the floor. No feeling in his right foot. Did not lose consciousness, no syncope. Reports 18 lb weight gain since hospital discharge. Drinking 3 8 oz glasses of apple juice daily but says he only drinks a total of  4 8 oz glasses of fluids daily.  Reports low-sodium diet, cooking at home. Cannot stand up to cook but uses the microwave to steamed vegetables and bakes meat in the oven. Is sleeping sitting up in a chair, feels like he is drowning when he lies down. He reports chest discomfort, shortness of breath, lower extremity edema, fatigue, weakness, presyncope, orthopnea, and PND.   Past Medical History:  Diagnosis Date   Anxiety    Biventricular ICD (implantable cardioverter-defibrillator) in place    Chronic systolic CHF (congestive heart failure) (HCC)    Cocaine use    Diabetes mellitus without complication (HCC)    GERD (gastroesophageal reflux disease)    HLD (hyperlipidemia)    Hypertension    Morbid obesity (HCC)    NICM (nonischemic cardiomyopathy) (HCC)    OSA (obstructive sleep apnea)    PTSD (post-traumatic stress disorder)    on Depakote   Refusal of blood transfusions as patient is Jehovah's Witness    Tobacco abuse     Past Surgical History:  Procedure Laterality Date   BIV ICD INSERTION CRT-D     FOOT FRACTURE SURGERY     RIGHT HEART CATH N/A 03/06/2022   Procedure: RIGHT HEART CATH;  Surgeon: Orbie Pyo, MD;  Location: [facility] INVASIVE CV LAB;  Service: Cardiovascular;  Laterality: N/A;   ROTATOR CUFF REPAIR     TONSILLECTOMY       Medications Prior to Admission: Prior to Admission medications   Medication Sig Start Date End Date Taking? Authorizing Provider  carvedilol (COREG) 12.5 MG tablet Take 1 tablet (12.5 mg total) by mouth 2 (two) times daily with a meal. 03/08/22   Patrecia Pour, MD  clopidogrel (PLAVIX) 75 MG tablet Take 1 tablet (75 mg total) by mouth daily. 08/22/21    Clapacs, Madie Reno, MD  cyclobenzaprine (FLEXERIL) 10 MG tablet Take 10 mg by mouth 3 (three) times daily. 01/20/22   [provider]  dapagliflozin propanediol (FARXIGA) 10 MG TABS tablet Take 1 tablet (10 mg total) by mouth daily. 03/08/22   Patrecia Pour, MD  DULoxetine (CYMBALTA) 30 MG capsule Take 3 capsules (90 mg total) by mouth daily. 09/03/21   Nwoko, Terese Door, PA  furosemide (LASIX) 80 MG tablet Take 1 tablet (80 mg total) by mouth daily. 08/21/21   Clapacs, Madie Reno, MD  hydrOXYzine (ATARAX) 25 MG tablet Take 1 tablet (25 mg total) by mouth 3 (three) times daily as needed for anxiety. 09/03/21   Nwoko, Terese Door, PA  Insulin Lispro Prot & Lispro (HUMALOG MIX 75/25 KWIKPEN) (75-25) 100 UNIT/ML Kwikpen Inject 70 Units into the skin 3 (three) times daily.    [provider]  LANTUS SOLOSTAR 100 UNIT/ML Solostar Pen Inject 50 Units into the skin 2 (two) times daily. 01/12/22   [provider]  neomycin-polymyxin-hydrocortisone (CORTISPORIN) 3.5-10000-1 OTIC suspension Place 4 drops into the left ear 4 (four) times daily. 10 day course. 02/19/22   [provider]  OXYGEN Inhale 3 L into the lungs continuous.    [provider]  pantoprazole (PROTONIX) 40 MG tablet Take 1 tablet (40 mg total) by mouth daily. 08/21/21   Clapacs, Madie Reno, MD  prazosin (MINIPRESS) 2 MG capsule Take 1 capsule (2 mg total) by mouth at bedtime. 09/03/21   Nwoko, Terese Door, PA  rosuvastatin (CRESTOR) 20 MG tablet Take 1 tablet (20 mg total) by mouth daily. 08/22/21   Clapacs, Madie Reno, MD  sacubitril-valsartan (ENTRESTO) 24-26 MG Take 1 tablet by mouth 2 (two) times daily. 03/08/22   Patrecia Pour, MD  spironolactone (ALDACTONE) 25 MG tablet Take 1 tablet (25 mg total) by mouth daily. 03/08/22   Patrecia Pour, MD  terbinafine (LAMISIL) 1 % cream Apply topically 2 (two) times daily. 03/08/22   Patrecia Pour, MD  traZODone (DESYREL) 100 MG tablet Take 2 tablets (200 mg total) by mouth at bedtime.  09/03/21   Nwoko, Isidoro Donning E, PA  TRULICITY 1.5 0000000 SOPN Inject 1.5 mg into the skin once a week. Patient taking differently: Inject 1.5 mg into the skin every Friday. 08/21/21   Clapacs, Madie Reno, MD     Allergies:    Allergies  Allergen Reactions   Aspirin Anaphylaxis and Other (See Comments)    Throat closing    Iodine-131 Hives   Iodinated Contrast Media Hives   Latex Hives and Rash   Penicillins Nausea And Vomiting   Ultram [Tramadol] Hives and Nausea Only   Augmentin [Amoxicillin-Pot Clavulanate] Diarrhea   Lidocaine Rash   Zestril [Lisinopril] Cough    Social History:   Social History   Socioeconomic History   Marital status: Widowed    Spouse name: Not on file   Number of children: 7   Years of education: Not on file   Highest education  level: Bachelor's degree (e.g., BA, AB, BS)  Occupational History   Occupation: disability  Tobacco Use   Smoking status: Every Day    Packs/day: 0.20    Types: Cigarettes   Smokeless tobacco: Never   Tobacco comments:    occasionally  Vaping Use   Vaping Use: Never used  Substance and Sexual Activity   Alcohol use: Not Currently   Drug use: Yes    Types: Cocaine   Sexual activity: Not Currently  Other Topics Concern   Not on file  Social History Narrative   Not on file   Social Determinants of Health   Financial Resource Strain: Low Risk  (02/26/2022)   Overall Financial Resource Strain (CARDIA)    Difficulty of Paying Living Expenses: Not very hard  Food Insecurity: No Food Insecurity (02/26/2022)   Hunger Vital Sign    Worried About Running Out of Food in the Last Year: Never true    Ran Out of Food in the Last Year: Never true  Transportation Needs: No Transportation Needs (02/26/2022)   PRAPARE - Hydrologist (Medical): No    Lack of Transportation (Non-Medical): No  Physical Activity: Not on file  Stress: Not on file  Social Connections: Not on file  Intimate Partner Violence: Not  on file    Family History:   The patient's family history includes Bone cancer in his father; Hypertension in his mother; Lung cancer in his father.    ROS:  Please see the history of present illness.  + chest discomfort + DOE + orthopnea All other ROS reviewed and negative.     Physical Exam/Data:   Vitals:   03/16/22 1605 03/16/22 1606  BP:  133/73  Pulse:  88  Resp:  20  Temp:  98.4 F (36.9 C)  TempSrc:  Oral  SpO2:  99%  Weight: (!) 140.6 kg   Height: 5\' 5"  (1.651 m)    No intake or output data in the 24 hours ending 03/16/22 1703    03/16/2022    4:05 PM 03/16/2022    2:20 PM 03/08/2022    5:31 AM  Last 3 Weights  Weight (lbs) 310 lb 310 lb 292 lb 15.9 oz  Weight (kg) 140.615 kg 140.615 kg 132.9 kg     Body mass index is 51.59 kg/m.  General:  Well nourished, well developed, in no acute distress HEENT: normal Neck: + JVD Vascular: No carotid bruits; Distal pulses 2+ bilaterally   Cardiac:  normal S1, S2; RRR; no murmur  Lungs:  clear to auscultation bilaterally, no wheezing, rhonchi or rales  Abd: soft, nontender, no hepatomegaly  Ext: Mild bilateral LE edema Musculoskeletal:  No deformities, BUE and BLE strength normal and equal Skin: warm and dry  Neuro:  CNs 2-12 intact, no focal abnormalities noted Psych:  Normal affect    EKG:  EKG:  EKG is ordered today.  The ekg ordered today demonstrates A sensed, V paced at 85 bpm   Relevant CV Studies:  Echo 02/27/22  1. Left ventricular ejection fraction, by estimation, is 25%. The left  ventricle has severely decreased function. The left ventricle demonstrates  global hypokinesis. Cannot comment on regional wall motion abnormalities  and Definity contrast was not  attempted. Consider repeat limited echo for RWM assessment if clinically  indicated. The left ventricular internal cavity size was mildly dilated.  Left ventricular diastolic parameters are indeterminate.   2. Right ventricular systolic function  is mildly reduced. The  right  ventricular size is normal. Tricuspid regurgitation signal is inadequate  for assessing PA pressure.   3. The mitral valve is grossly normal. Trivial mitral valve  regurgitation. No evidence of mitral stenosis.   4. The aortic valve is grossly normal. There is mild calcification of the  aortic valve. Aortic valve regurgitation is not visualized. No aortic  stenosis is present.   5. The inferior vena cava is normal in size with greater than 50%  respiratory variability, suggesting right atrial pressure of 3 mmHg.   [place] 03/06/22 1. Relatively normal filling pressures with a mean RA pressure of 8 mmHg, mean wedge pressure of 13 mmHg, mean PA pressure of 22 mmHg, with a mildly reduced Fick cardiac output of 4.28 L/min and index of 1.82 L/min/m   Cor CTA 01/19/19  IMPRESSION: 1. Coronary calcium not possible to assess secondary to artifact with BiV ICD leads, however no calcifications seen in the visualized portions of coronary arteries.   2. Normal coronary origin with left dominance.   3. This study is of very limited quality with significant artifact from leads of biventricular pacemaker. There is no obvious plaque in the visualized portions of coronary arteries - LM, proximal, mid LAD, proximal and mid portion of LCX artery and PDA.    Laboratory Data:  High Sensitivity Troponin:   Recent Labs  Lab 02/26/22 0739 02/27/22 2012 02/27/22 2256 02/28/22 1859 02/28/22 2030  TROPONINIHS 12 19* 20* 14 15      ChemistryNo results for input(s): "NA", "K", "CL", "CO2", "GLUCOSE", "BUN", "CREATININE", "CALCIUM", "MG", "GFRNONAA", "GFRAA", "ANIONGAP" in the last 168 hours.  No results for input(s): "PROT", "ALBUMIN", "AST", "ALT", "ALKPHOS", "BILITOT" in the last 168 hours. Lipids No results for input(s): "CHOL", "TRIG", "HDL", "LABVLDL", "LDLCALC", "CHOLHDL" in the last 168 hours. Hematology Recent Labs  Lab 03/16/22 1629  WBC 9.7  RBC 4.53  HGB 13.3   HCT 40.6  MCV 89.6  MCH 29.4  MCHC 32.8  RDW 13.5  PLT 251   Thyroid No results for input(s): "TSH", "FREET4" in the last 168 hours. BNPNo results for input(s): "BNP", "PROBNP" in the last 168 hours.  DDimer No results for input(s): "DDIMER" in the last 168 hours.   Radiology/Studies:  DG Chest 2 View  Result Date: 03/16/2022 CLINICAL DATA:  Shortness of breath, history of CHF EXAM: CHEST - 2 VIEW COMPARISON:  02/26/2022 FINDINGS: Unchanged cardiomegaly. Unchanged appearance of left chest cardiac device and leads. No focal pulmonary opacity. No pleural effusion or pneumothorax. No acute osseous abnormality. IMPRESSION: Cardiomegaly without additional acute cardiopulmonary process. Electronically Signed   By: Merilyn Baba M.D.   On: 03/16/2022 16:54     Assessment and Plan:    Acute on chronic combined CHF/NICM: LVEF 25% on echo 02/27/22. [place] 9/8 revealed relatively normal filling pressures with mean RA pressure of 8 mmHg, mean wedge pressure 13 mmHg, mean PA pressure 22 mmHg with a mildly reduced Fick cardiac output.  Weight increased 18 pounds in 8 days. Appears volume overloaded on exam. Tried additional 80 mg Lasix with little improvement in symptoms at home. Reports severe chest discomfort, difficulty breathing, orthopnea. Mild bilateral LE edema. Discussed with Dr. Irish Lack, B and E who advised hospital admission for fluid management.  Patient arrived at office visit via [place] car service. Will send to ED by EMS to await admission.   ICD implant: Device previously followed by [facility] approaching end of life, will need generator change. Would like to establish with our EP providers.  Chronic respiratory failure with hypoxia: No evidence of severe respiratory distress. Continue chronic O2.        Risk Assessment/Risk Scores:       New York Heart Association (NYHA) Functional Class NYHA Class III     Severity of Illness: The appropriate patient status for this patient is  INPATIENT. Inpatient status is judged to be reasonable and necessary in order to provide the required intensity of service to ensure the patient's safety. The patient's presenting symptoms, physical exam findings, and initial radiographic and laboratory data in the context of their chronic comorbidities is felt to place them at high risk for further clinical deterioration. Furthermore, it is not anticipated that the patient will be medically stable for discharge from the hospital within 2 midnights of admission.   * I certify that at the point of admission it is my clinical judgment that the patient will require inpatient hospital care spanning beyond 2 midnights from the point of admission due to high intensity of service, high risk for further deterioration and high frequency of surveillance required.*   For questions or updates, please contact [facility] Please consult [email or web address] for contact info under     Signed, Emmaline Life, NP  03/16/2022 5:03 PM   Discussed assessment and plan with Christen Bame.  Agree with above as stated.  Plan to admit for IV diuresis.   Larae Grooms

## 2022-03-16 NOTE — ED Notes (Signed)
EKG hard copy in triage

## 2022-03-16 NOTE — ED Provider Triage Note (Signed)
Emergency Medicine Provider Triage Evaluation Note  Luis [name] , a 51 y.o. male  was evaluated in triage.  Pt complains of shortness of breath and weight gain.  Seen by cardiology today.  Had a recent discharge from the hospital for fluid overload.  Was noted to have 18 pound weight gain. On chronic oxygen. Admits to PND and orthopnea. Sleeping in a recliner.  Review of Systems  Positive: SOB, PND, Orthopnea Negative:   Physical Exam  There were no vitals taken for this visit. Gen:   Awake, no distress   Resp:  Normal effort  MSK:   Moves extremities without difficulty  Other:    Medical Decision Making  Medically screening exam initiated at 4:04 PM.  Appropriate orders placed.  Luis [name] was informed that the remainder of the evaluation will be completed by another provider, this initial triage assessment does not replace that evaluation, and the importance of remaining in the ED until their evaluation is complete.  SOB, hx of CHF   Abdinasir Spadafore A, PA-C 03/16/22 1604

## 2022-03-16 NOTE — ED Triage Notes (Signed)
Just left cardiology up 19lbs feels he is drowning.  Patient only able to sleep in recliner.  Always on 2L [place]

## 2022-03-17 ENCOUNTER — Encounter (HOSPITAL_COMMUNITY): Payer: Self-pay | Admitting: Interventional Cardiology

## 2022-03-17 ENCOUNTER — Inpatient Hospital Stay (HOSPITAL_COMMUNITY): Payer: Medicare Other

## 2022-03-17 DIAGNOSIS — I5043 Acute on chronic combined systolic (congestive) and diastolic (congestive) heart failure: Secondary | ICD-10-CM | POA: Diagnosis not present

## 2022-03-17 LAB — GLUCOSE, CAPILLARY
Glucose-Capillary: 205 mg/dL — ABNORMAL HIGH (ref 70–99)
Glucose-Capillary: 271 mg/dL — ABNORMAL HIGH (ref 70–99)
Glucose-Capillary: 160 mg/dL — ABNORMAL HIGH (ref 70–99)
Glucose-Capillary: 303 mg/dL — ABNORMAL HIGH (ref 70–99)

## 2022-03-17 LAB — BASIC METABOLIC PANEL
Anion gap: 8 (ref 5–15)
BUN: 17 mg/dL (ref 6–20)
CO2: 26 mmol/L (ref 22–32)
Calcium: 9 mg/dL (ref 8.9–10.3)
Chloride: 101 mmol/L (ref 98–111)
Creatinine, Ser: 0.86 mg/dL (ref 0.61–1.24)
GFR, Estimated: >60 mL/min (ref >60)
Glucose, Bld: 206 mg/dL — ABNORMAL HIGH (ref 70–99)
Potassium: 4 mmol/L (ref 3.5–5.1)
Sodium: 135 mmol/L (ref 135–145)

## 2022-03-17 MED ORDER — INSULIN GLARGINE-YFGN 100 UNIT/ML ~~LOC~~ SOLN
20.0000 [IU] | Freq: Every day | SUBCUTANEOUS | Status: DC
  Administered 2022-03-17: 20 [IU] via SUBCUTANEOUS
  Filled 2022-03-17 (×2): qty 0.2

## 2022-03-17 NOTE — Progress Notes (Signed)
DAILY PROGRESS NOTE   Patient Name: Luis [name] Date of Encounter: 03/17/2022 Cardiologist: Glenetta Hew, MD  Chief Complaint   Breathing has improved  Patient Profile   Luis [name] is a 51 y.o. male  who is being seen 03/16/2022 for the evaluation of CHF.  Subjective   Net negative 2L overnight- now 4L negative since admission. Breathing has improved. Had gained 22 lbs recently. Still feels tight in his chest.   Objective   Vitals:   03/17/22 0448 03/17/22 0813 03/17/22 0821 03/17/22 1124  BP: (!) 140/74 138/73  127/73  Pulse: 86 89  93  Resp: 18 18  18   Temp: 98.2 F (36.8 C) 98.3 F (36.8 C)  98.2 F (36.8 C)  TempSrc: Oral Oral  Oral  SpO2: 98% 99%  97%  Weight:   (!) 138.9 kg   Height:        Intake/Output Summary (Last 24 hours) at 03/17/2022 1204 Last data filed at 03/17/2022 1124 Gross per 24 hour  Intake 480 ml  Output 4500 ml  Net -4020 ml   Filed Weights   03/16/22 1605 03/16/22 1839 03/17/22 0821  Weight: (!) 140.6 kg (!) 138.7 kg (!) 138.9 kg    Physical Exam   General appearance: alert, mild distress, and morbidly obese Neck: JVD - several cm above sternal notch, no carotid bruit, and thyroid not enlarged, symmetric, no tenderness/mass/nodules Lungs: diminished breath sounds bibasilar Heart: regular rate and rhythm Abdomen: morbidly obese Extremities: edema 2+ bilateral LE Pulses: 2+ and symmetric Skin: Skin color, texture, turgor normal. No rashes or lesions Neurologic: Grossly normal Psych: Frustrated  Inpatient Medications    Scheduled Meds:  carvedilol  12.5 mg Oral BID WC   clopidogrel  75 mg Oral Daily   dapagliflozin propanediol  10 mg Oral Daily   DULoxetine  90 mg Oral Daily   enoxaparin (LOVENOX) injection  0.5 mg/kg Subcutaneous Q24H   furosemide  40 mg Intravenous BID   insulin aspart  0-15 Units Subcutaneous TID WC   pantoprazole  40 mg Oral Daily   prazosin  2 mg Oral QHS   rosuvastatin  20 mg  Oral Daily   sacubitril-valsartan  1 tablet Oral BID   sodium chloride flush  3 mL Intravenous Q12H   spironolactone  25 mg Oral Daily   traZODone  200 mg Oral QHS    Continuous Infusions:  sodium chloride      PRN Meds: sodium chloride, acetaminophen, hydrOXYzine, sodium chloride flush   Labs   Results for orders placed or performed during the hospital encounter of 03/16/22 (from the past 48 hour(s))  CBC with Differential     Status: None   Collection Time: 03/16/22  4:29 PM  Result Value Ref Range   WBC 9.7 4.0 - 10.5 K/uL   RBC 4.53 4.22 - 5.81 MIL/uL   Hemoglobin 13.3 13.0 - 17.0 g/dL   HCT 40.6 39.0 - 52.0 %   MCV 89.6 80.0 - 100.0 fL   MCH 29.4 26.0 - 34.0 pg   MCHC 32.8 30.0 - 36.0 g/dL   RDW 13.5 11.5 - 15.5 %   Platelets 251 150 - 400 K/uL   nRBC 0.0 0.0 - 0.2 %   Neutrophils Relative % 62 %   Neutro Abs 5.9 1.7 - 7.7 K/uL   Lymphocytes Relative 28 %   Lymphs Abs 2.7 0.7 - 4.0 K/uL   Monocytes Relative 9 %   Monocytes Absolute 0.9 0.1 -  1.0 K/uL   Eosinophils Relative 1 %   Eosinophils Absolute 0.1 0.0 - 0.5 K/uL   Basophils Relative 0 %   Basophils Absolute 0.0 0.0 - 0.1 K/uL   Immature Granulocytes 0 %   Abs Immature Granulocytes 0.04 0.00 - 0.07 K/uL    Comment: Performed at [facility], [place] [address], [place], [place] [id number]  Basic metabolic panel     Status: None   Collection Time: 03/16/22  4:29 PM  Result Value Ref Range   Sodium 139 135 - 145 mmol/L   Potassium 4.0 3.5 - 5.1 mmol/L   Chloride 107 98 - 111 mmol/L   CO2 26 22 - 32 mmol/L   Glucose, Bld 99 70 - 99 mg/dL    Comment: Glucose reference range applies only to samples taken after fasting for at least 8 hours.   BUN 19 6 - 20 mg/dL   Creatinine, Ser 0.83 0.61 - 1.24 mg/dL   Calcium 9.0 8.9 - 10.3 mg/dL   GFR, Estimated >60 >60 mL/min    Comment: (NOTE) Calculated using the CKD-EPI Creatinine Equation (2021)    Anion gap 6 5 - 15    Comment: Performed at [facility] [address] [place], [place] [address]  Brain natriuretic peptide     Status: Abnormal   Collection Time: 03/16/22  4:29 PM  Result Value Ref Range   B Natriuretic Peptide 103.9 (H) 0.0 - 100.0 pg/mL    Comment: Performed at [facility] [address] [place], [place] [address]  Glucose, capillary     Status: None   Collection Time: 03/16/22  6:29 PM  Result Value Ref Range   Glucose-Capillary 96 70 - 99 mg/dL    Comment: Glucose reference range applies only to samples taken after fasting for at least 8 hours.  Brain natriuretic peptide     Status: None   Collection Time: 03/16/22  6:49 PM  Result Value Ref Range   B Natriuretic Peptide 93.3 0.0 - 100.0 pg/mL    Comment: Performed at [facility] [address], [place], [place] [address]  Glucose, capillary     Status: Abnormal   Collection Time: 03/16/22  9:36 PM  Result Value Ref Range   Glucose-Capillary 135 (H) 70 - 99 mg/dL    Comment: Glucose reference range applies only to samples taken after fasting for at least 8 hours.   Comment 1 Notify RN    Comment 2 Document in Chart   Glucose, capillary     Status: Abnormal   Collection Time: 03/17/22  6:05 AM  Result Value Ref Range   Glucose-Capillary 205 (H) 70 - 99 mg/dL    Comment: Glucose reference range applies only to samples taken after fasting for at least 8 hours.   Comment 1 Notify RN    Comment 2 Document in Chart   Basic metabolic panel     Status: Abnormal   Collection Time: 03/17/22  7:27 AM  Result Value Ref Range   Sodium 135 135 - 145 mmol/L   Potassium 4.0 3.5 - 5.1 mmol/L   Chloride 101 98 - 111 mmol/L   CO2 26 22 - 32 mmol/L   Glucose, Bld 206 (H) 70 - 99 mg/dL    Comment: Glucose reference range applies only to samples taken after fasting for at least 8 hours.   BUN 17 6 - 20 mg/dL   Creatinine, Ser 0.86 0.61 - 1.24 mg/dL  Calcium 9.0 8.9 - 10.3 mg/dL   GFR, Estimated >60 >60 mL/min    Comment: (NOTE) Calculated using the  CKD-EPI Creatinine Equation (2021)    Anion gap 8 5 - 15    Comment: Performed at [facility], [place] [address] [place], [place] [address]  Glucose, capillary     Status: Abnormal   Collection Time: 03/17/22 11:27 AM  Result Value Ref Range   Glucose-Capillary 271 (H) 70 - 99 mg/dL    Comment: Glucose reference range applies only to samples taken after fasting for at least 8 hours.   Comment 1 Notify RN    Comment 2 Document in Chart     ECG   Atrial sensed, v-paced rhythm - Personally Reviewed  Telemetry   Paced rhythm - Personally Reviewed  Radiology    DG Chest 2 View  Result Date: 03/16/2022 CLINICAL DATA:  Shortness of breath, history of CHF EXAM: CHEST - 2 VIEW COMPARISON:  02/26/2022 FINDINGS: Unchanged cardiomegaly. Unchanged appearance of left chest cardiac device and leads. No focal pulmonary opacity. No pleural effusion or pneumothorax. No acute osseous abnormality. IMPRESSION: Cardiomegaly without additional acute cardiopulmonary process. Electronically Signed   By: Merilyn Baba M.D.   On: 03/16/2022 16:54    Cardiac Studies   N/A  Assessment   Principal Problem:   Acute on chronic combined systolic and diastolic CHF (congestive heart failure) (HCC) BIV-AICD near ERI  Plan   Good response to diuretics - now almost 4L negative. Continue 40 mg IV BID. Weight is down 2 kg since admission - his dry weight may be around 286 lbs (since he reports recent 22 lb weight gain). Continue GDMT for HF. Recent echo on 9/1 showed LVEF 25% - no clear role for a repeat echo, so I cancelled it. [place] on 9/8 demonstrated normal filling pressures with reduced cardiac output. Wants to establish with [facility] EP device clinic - interrogation revealed he is nearing ERI.  Time Spent Directly with Patient:  I have spent a total of 25 minutes with the patient reviewing hospital notes, telemetry, EKGs, labs and examining the patient as well as establishing an assessment and plan that was  discussed personally with the patient.  > 50% of time was spent in direct patient care.  Length of Stay:  LOS: 1 day   Pixie Casino, MD, [facility], [place] Director of the Advanced Lipid Disorders &  Cardiovascular Risk Reduction Clinic Diplomate of the American Board of Clinical Lipidology Attending Cardiologist  Direct Dial: [phone]  Fax: [phone]  Website:  [email or web address] Osgood Hosie Sharman 03/17/2022, 12:04 PM

## 2022-03-17 NOTE — Progress Notes (Signed)
PT able to place CPAP on self. Will call if help is needed.

## 2022-03-17 NOTE — Progress Notes (Signed)
2D echo attempted, staff in room. Will try later.

## 2022-03-17 NOTE — Progress Notes (Signed)
Heart Failure Nurse Navigator Progress Note  PCP: Luis Han, MD (Inactive) PCP-Cardiologist: Ellyn Hack Admission Diagnosis: Acute on chronic combined CHF, Chronic respiratory failure with hypoxia.  Admitted from: doctor office to ER  Presentation:   Luis [name] presented with shortness of breath, recently discharged 03/07/22 for fluid overload, in last 2 weeks has gained 18+ pounds, is dizzy and has had multiple falls as a result. Patient feel as if he "is drowning, and has to sleep in the recliner chair to breathe"). BP 137/82, HR 88, BNP 103, BMI 49.35, trace pitting edema to bilateral lower extremities. Given IV lasix 40 mg , CXr with cardiomegaly, EF 25% on 02/27/2022. Repeat ECHO pending.   Patient was educated on the sign and symptoms of heart failure, daily weights, when to call his doctor or go to the ER. Diet/ fluid restrictions, taking all medications as prescribed and attending all medical appointments.Patient voiced his understanding and is scheduled for a HF TOC appointment on 10/2/02023 @ 12 noon.   ECHO/ LVEF: 25%  Clinical Course:  Past Medical History:  Diagnosis Date   Anxiety    Biventricular ICD (implantable cardioverter-defibrillator) in place    Chronic systolic CHF (congestive heart failure) (HCC)    Cocaine use    Diabetes mellitus without complication (HCC)    GERD (gastroesophageal reflux disease)    HLD (hyperlipidemia)    Hypertension    Morbid obesity (HCC)    NICM (nonischemic cardiomyopathy) (HCC)    OSA (obstructive sleep apnea)    PTSD (post-traumatic stress disorder)    on Depakote   Refusal of blood transfusions as patient is Jehovah's Witness    Tobacco abuse      Social History   Socioeconomic History   Marital status: Widowed    Spouse name: Not on file   Number of children: 7   Years of education: Not on file   Highest education level: Bachelor's degree (e.g., BA, AB, BS)  Occupational History   Occupation:  disability  Tobacco Use   Smoking status: Former    Packs/day: 0.20    Types: Cigarettes    Quit date: 10/2021    Years since quitting: 0.3   Smokeless tobacco: Never   Tobacco comments:    occasionally  Vaping Use   Vaping Use: Never used  Substance and Sexual Activity   Alcohol use: Not Currently   Drug use: Yes    Types: Cocaine    Comment: last used july   Sexual activity: Not Currently  Other Topics Concern   Not on file  Social History Narrative   Not on file   Social Determinants of Health   Financial Resource Strain: Low Risk  (03/17/2022)   Overall Financial Resource Strain (CARDIA)    Difficulty of Paying Living Expenses: Not hard at all  Food Insecurity: No Food Insecurity (03/17/2022)   Hunger Vital Sign    Worried About Running Out of Food in the Last Year: Never true    Ran Out of Food in the Last Year: Never true  Transportation Needs: No Transportation Needs (03/17/2022)   PRAPARE - Hydrologist (Medical): No    Lack of Transportation (Non-Medical): No  Physical Activity: Not on file  Stress: Not on file  Social Connections: Not on file   Education Assessment and Provision:  Detailed education and instructions provided on heart failure disease management including the following:  Signs and symptoms of Heart Failure When to call the physician Importance of  daily weights Low sodium diet Fluid restriction Medication management Anticipated future follow-up appointments  Patient education given on each of the above topics.  Patient acknowledges understanding via teach back method and acceptance of all instructions.  Education Materials:  "Living Better With Heart Failure" Booklet, HF zone tool, & Daily Weight Tracker Tool.  Patient has scale at home: yes Patient has pill box at home: yes    High Risk Criteria for Readmission and/or Poor Patient Outcomes: Heart failure hospital admissions (last 6 months): 3  No Show rate:  11 % Difficult social situation: No Demonstrates medication adherence: Yes, per patient Primary Language: English Literacy level: Reading, writing, and comprehension.   Barriers of Care:   Diet/ fluid restrictions  Daily weights   Considerations/Referrals:   Referral made to Heart Failure Pharmacist Stewardship: yes Referral made to Heart Failure CSW/NCM TOC: No Referral made to Heart & Vascular TOC clinic: Yes, 03/30/2022 @ 12 noon.   Items for Follow-up on DC/TOC: Diet/ fluid restrictions Daily weights Patient wants meds changed to Mail order ( exact care)   Earnestine Leys, BSN, RN Heart Failure Nurse Navigator Secure Chat Only

## 2022-03-17 NOTE — Inpatient Diabetes Management (Signed)
Inpatient Diabetes Program Recommendations  AACE/ADA: New Consensus Statement on Inpatient Glycemic Control (2015)  Target Ranges:  Prepandial:   less than 140 mg/dL      Peak postprandial:   less than 180 mg/dL (1-2 hours)      Critically ill patients:  140 - 180 mg/dL   Lab Results  Component Value Date   GLUCAP 271 (H) 03/17/2022   HGBA1C 9.1 (H) 02/26/2022    Review of Glycemic Control  Latest Reference Range & Units 03/16/22 21:36 03/17/22 06:05 03/17/22 11:27  Glucose-Capillary 70 - 99 mg/dL 135 (H) 205 (H) 271 (H)   Diabetes history: DM 2 Outpatient Diabetes medications: Farxiga 10 mg Daily, 75/25 70 units tid, Trulicity 1.5 mg Weekly Current orders for Inpatient glycemic control:  Farxiga 10 mg Daily Novolog 0-15 units tid  A1c 9.1% on 8/31  Inpatient Diabetes Program Recommendations:    -   Consider adding Semglee 20 units  Spoke with pt at bedside regarding A1c level of 9.1% on 8/31 and glucose control at home. Pt reports being newly placed on Farxiga during last admission to the hospital and since then he states that his glucose trends have been better since the A1c reading we have in August. Pt monitors glucose levels at home via a Dexcom CGM. Pt could not recognize a pattern to his glucose trends at home. Pt reports changing his diet at home to help with glucose trends. Discussed current A1c and reviewed glucose and A1c goals. Encouraged pt to continue with diet at home. Pt reports only drinking water for the last 1-2 months. Encouraged glucose control for cardiovascular health.  Thanks,  Tama Headings RN, MSN, BC-ADM Inpatient Diabetes Coordinator Team Pager [phone] (8a-5p)

## 2022-03-17 NOTE — TOC Progression Note (Signed)
Transition of Care [facility]) - Progression Note    Patient Details  Name: RAYBON CONARD MRN: [id number] Date of Birth: 1971-03-06  Transition of [facility]) CM/SW Contact  Zenon Mayo, RN Phone Number: 03/17/2022, 4:54 PM  Clinical Narrative:    from the clinic, home alone, CHF, has home oxygen  2 liters, wears cpap at night. Conts lasix iv bid, EF of 25%. TOC following.        Expected Discharge Plan and Services                                                 Social Determinants of Health (SDOH) Interventions Food Insecurity Interventions: Intervention Not Indicated Housing Interventions: Intervention Not Indicated Transportation Interventions: Intervention Not Indicated Utilities Interventions: Intervention Not Indicated Alcohol Usage Interventions: Intervention Not Indicated (Score <7) Financial Strain Interventions: Intervention Not Indicated  Readmission Risk Interventions    01/31/2021   12:26 PM 01/31/2021    9:25 AM  Readmission Risk Prevention Plan  Transportation Screening Complete   PCP or Specialist Appt within 3-5 Days Complete Complete  HRI or Home Care Consult Complete Complete  Social Work Consult for [place] Planning/Counseling Patient refused Complete  Palliative Care Screening Not Applicable Not Applicable  Medication Review Press photographer) Complete Referral to Pharmacy

## 2022-03-17 NOTE — Progress Notes (Signed)
Heart Failure Stewardship Pharmacist Progress Note   PCP: Buzzy Han, MD (Inactive) PCP-Cardiologist: Glenetta Hew, MD    HPI:  51 yo M with PMH of HTN, T2DM, HLD, HFrEF since ~2000, polysubstance use, OSA on CPAP, and PPM.    He presented to the ED on 02/26/22 with chest pain, shortness of breath, and LE edema. He reports he took 320 mg of lasix without any improvement. CXR with mild vascular congestion without pulmonary edema. ECHO on 9/1 showed LVEF 25%, RV mildly reduced. [place] on 9/8 with relatively normal filling pressures and mildly reduced cardiac output (RA 8, PA 22, wedge 13, CO 4.3, CI 1.8). He was discharged on 9/10 with carvedilol, Farxiga, lasix, Entresto and spironolactone.  He had hospital follow up with [facility] on 9/18 and felt lightheadedness and reported falls. Also complains of orthopnea, weight gain of 18 lbs, LE edema, fatigue, chest discomfort, and shortness of breath. He tried additional 80 mg of lasix without any improvement. Sent to ED for evaluation and admitted for IV diuresis. CXR with cardiomegaly. Repeat ECHO pending.   Current HF Medications: Diuretic: furosemide 40 mg IV BID Beta Blocker: carvedilol 12.5 mg BID ACE/ARB/ARNI: Entresto 24/26 mg BID MRA: spironolactone 25 mg daily SGLT2i: Farxiga 10 mg daily  Prior to admission HF Medications: Diuretic: furosemide 80 mg daily Beta blocker: carvedilol 12.5 mg BID ACE/ARB/ARNI: Entresto 24/26 mg BID MRA: spironolactone 25 mg daily SGLT2i: Farxiga 10 mg daily  Pertinent Lab Values: Serum creatinine 0.86, BUN 17, Potassium 4.0, Sodium 135, BNP 103.9, A1c 9.1   Vital Signs: Weight: 306 lbs (admission weight: 305 lbs) Blood pressure: 120-140/7s  Heart rate: 80-90s  I/O: -650mL yesterday; net -2L  Medication Assistance / Insurance Benefits Check: Does the patient have prescription insurance?  Yes Type of insurance plan: [place] Medicaid   Outpatient Pharmacy:  Prior to admission outpatient  pharmacy: [facility] Is the patient willing to use [facility] pharmacy at discharge? Yes Is the patient willing to transition their outpatient pharmacy to utilize a [facility] outpatient pharmacy?   Pending    Assessment: 1. Acute on chronic systolic CHF (LVEF 25%), due to NICM. NYHA class III symptoms. - On furosemide 40 mg IV BID. Last hospitalization required 80 mg IV BID. Consider increasing given weight up another 1lb today. Strict I/O and daily weights. Keep K>4. Check magnesium with AM labs. - Continue carvedilol 12.5 mg BID - Continue Entresto 24/26 mg BID - Continue spironolactone 25 mg daily - Continue Farxiga 10 mg daily   Plan: 1) Medication changes recommended at this time: - Increase furosemide to 80 mg IV BID - Check magnesium with AM labs  2) Patient assistance: - Has  Medicaid  3)  Education  - To be completed prior to discharge  Kerby Nora, PharmD, BCPS Heart Failure Cytogeneticist Phone [phone]

## 2022-03-17 NOTE — Plan of Care (Signed)
  Problem: Education: Goal: Understanding of CV disease, CV risk reduction, and recovery process will improve Outcome: Progressing   Problem: Activity: Goal: Ability to return to baseline activity level will improve Outcome: Progressing   Problem: Coping: Goal: Ability to adjust to condition or change in health will improve Outcome: Progressing   Problem: Clinical Measurements: Goal: Respiratory complications will improve Outcome: Progressing

## 2022-03-17 NOTE — Progress Notes (Signed)
Heart Failure Navigator Progress Note  Following this hospitalization to assess for HV TOC readiness.   Echo pending? Last 25% (9/23)   Earnestine Leys, BSN, RN Heart Failure Leisure centre manager Chat Only

## 2022-03-18 DIAGNOSIS — I5043 Acute on chronic combined systolic (congestive) and diastolic (congestive) heart failure: Secondary | ICD-10-CM | POA: Diagnosis not present

## 2022-03-18 DIAGNOSIS — L293 Anogenital pruritus, unspecified: Secondary | ICD-10-CM | POA: Diagnosis not present

## 2022-03-18 LAB — BASIC METABOLIC PANEL
Anion gap: 9 (ref 5–15)
BUN: 18 mg/dL (ref 6–20)
CO2: 27 mmol/L (ref 22–32)
Calcium: 8.9 mg/dL (ref 8.9–10.3)
Chloride: 101 mmol/L (ref 98–111)
Creatinine, Ser: 0.93 mg/dL (ref 0.61–1.24)
GFR, Estimated: >60 mL/min (ref >60)
Glucose, Bld: 239 mg/dL — ABNORMAL HIGH (ref 70–99)
Potassium: 3.9 mmol/L (ref 3.5–5.1)
Sodium: 137 mmol/L (ref 135–145)

## 2022-03-18 LAB — GLUCOSE, CAPILLARY
Glucose-Capillary: 241 mg/dL — ABNORMAL HIGH (ref 70–99)
Glucose-Capillary: 163 mg/dL — ABNORMAL HIGH (ref 70–99)
Glucose-Capillary: 164 mg/dL — ABNORMAL HIGH (ref 70–99)
Glucose-Capillary: 286 mg/dL — ABNORMAL HIGH (ref 70–99)

## 2022-03-18 MED ORDER — NYSTATIN 100000 UNIT/GM EX POWD
Freq: Two times a day (BID) | CUTANEOUS | Status: DC
  Filled 2022-03-18: qty 15

## 2022-03-18 MED ORDER — POTASSIUM CHLORIDE CRYS ER 20 MEQ PO TBCR
40.0000 meq | EXTENDED_RELEASE_TABLET | Freq: Once | ORAL | Status: AC
  Administered 2022-03-18: 40 meq via ORAL
  Filled 2022-03-18: qty 2

## 2022-03-18 MED ORDER — INSULIN GLARGINE-YFGN 100 UNIT/ML ~~LOC~~ SOLN
30.0000 [IU] | Freq: Every day | SUBCUTANEOUS | Status: DC
  Administered 2022-03-18: 30 [IU] via SUBCUTANEOUS
  Filled 2022-03-18 (×3): qty 0.3

## 2022-03-18 MED ORDER — INSULIN ASPART 100 UNIT/ML IJ SOLN
5.0000 [IU] | Freq: Three times a day (TID) | INTRAMUSCULAR | Status: DC
  Administered 2022-03-18 – 2022-03-20 (×6): 5 [IU] via SUBCUTANEOUS

## 2022-03-18 NOTE — Progress Notes (Signed)
Heart Failure Stewardship Pharmacist Progress Note   PCP: Buzzy Han, MD (Inactive) PCP-Cardiologist: Glenetta Hew, MD    HPI:  51 yo M with PMH of HTN, T2DM, HLD, HFrEF since ~2000, polysubstance use, OSA on CPAP, and PPM.    He presented to the ED on 02/26/22 with chest pain, shortness of breath, and LE edema. He reports he took 320 mg of lasix without any improvement. CXR with mild vascular congestion without pulmonary edema. ECHO on 9/1 showed LVEF 25%, RV mildly reduced. [place] on 9/8 with relatively normal filling pressures and mildly reduced cardiac output (RA 8, PA 22, wedge 13, CO 4.3, CI 1.8). He was discharged on 9/10 with carvedilol, Farxiga, lasix, Entresto and spironolactone.  He had hospital follow up with [facility] on 9/18 and felt lightheadedness and reported falls. Also complains of orthopnea, weight gain of 18 lbs, LE edema, fatigue, chest discomfort, and shortness of breath. He tried additional 80 mg of lasix without any improvement. Sent to ED for evaluation and admitted for IV diuresis. CXR with cardiomegaly.  Current HF Medications: Diuretic: furosemide 40 mg IV BID Beta Blocker: carvedilol 12.5 mg BID ACE/ARB/ARNI: Entresto 24/26 mg BID MRA: spironolactone 25 mg daily SGLT2i: Farxiga 10 mg daily  Prior to admission HF Medications: Diuretic: furosemide 80 mg daily Beta blocker: carvedilol 12.5 mg BID ACE/ARB/ARNI: Entresto 24/26 mg BID MRA: spironolactone 25 mg daily SGLT2i: Farxiga 10 mg daily  Pertinent Lab Values: Serum creatinine 0.93, BUN 18, Potassium 3.9, Sodium 137, BNP 103.9, A1c 9.1   Vital Signs: Weight: 296 lbs (admission weight: 305 lbs) Blood pressure: 110-130/70s  Heart rate: 80-90s  I/O: -6.3L yesterday; net -6.8L  Medication Assistance / Insurance Benefits Check: Does the patient have prescription insurance?  Yes Type of insurance plan: [place] Medicaid   Outpatient Pharmacy:  Prior to admission outpatient pharmacy: [facility] Is the patient willing to use [facility] pharmacy at discharge? Yes Is the patient willing to transition their outpatient pharmacy to utilize a [facility] outpatient pharmacy?   Pending    Assessment: 1. Acute on chronic systolic CHF (LVEF 88%), due to NICM. NYHA class III symptoms. Dizziness and lightheadedness symptoms have improved.  - On furosemide 40 mg IV BID. Remains volume overloaded on exam. Strict I/O and daily weights. Goal K>4, recommend 40 mEq x 1. Check magnesium with AM labs. - Continue carvedilol 12.5 mg BID - Continue Entresto 24/26 mg BID - Continue spironolactone 25 mg daily - On Farxiga 10 mg daily. Today, he was complaining about possible yeast infection symptoms. Asking for terbinafine cream. May need to also consider holding Farxiga pending further evaluation. Will contact team.    Plan: 1) Medication changes recommended at this time: - KCl 40 mEq x 1 - Check magnesium with AM labs - May need to hold Farxiga pending evaluation for yeast infection  2) Patient assistance: - Has [place] Medicaid  3)  Education  - Patient has been educated on current HF medications and potential additions to HF medication regimen - Patient verbalizes understanding that over the next few months, these medication doses may change and more medications may be added to optimize HF regimen - Patient has been educated on basic disease state pathophysiology and goals of therapy   Kerby Nora, PharmD, BCPS Heart Failure Stewardship Pharmacist Phone [phone]

## 2022-03-18 NOTE — Progress Notes (Signed)
Attempted to ambulate patient, pt states he feels "tired" and just got back in the bed from the chair, added pt to mobility team list. CHF education reinforced, daily weights, when to call the doctor, low sodium diet, symptoms of fluid overload; cardiac rehab phase 2 [place] discussed.  [place] RN 9/20/20231:59 PM

## 2022-03-18 NOTE — Inpatient Diabetes Management (Signed)
Inpatient Diabetes Program Recommendations  AACE/ADA: New Consensus Statement on Inpatient Glycemic Control (2015)  Target Ranges:  Prepandial:   less than 140 mg/dL      Peak postprandial:   less than 180 mg/dL (1-2 hours)      Critically ill patients:  140 - 180 mg/dL   Lab Results  Component Value Date   GLUCAP 241 (H) 03/18/2022   HGBA1C 9.1 (H) 02/26/2022    Review of Glycemic Control   Diabetes history: DM 2 Outpatient Diabetes medications: Farxiga 10 mg Daily, 75/25 70 units tid, Trulicity 1.5 mg Weekly Current orders for Inpatient glycemic control:  Semglee 20 units qhs Farxiga 10 mg Daily Novolog 0-15 units tid  A1c 9.1% on 8/31  Inpatient Diabetes Program Recommendations:    -   Increase Semglee 30 units -   Add Novolog 5 units tid meal coverage if eating >50% of meals  Thanks,  Tama Headings RN, MSN, BC-ADM Inpatient Diabetes Coordinator Team Pager [phone] (8a-5p)

## 2022-03-18 NOTE — Progress Notes (Addendum)
DAILY PROGRESS NOTE   Patient Name: Luis [name] Date of Encounter: 03/18/2022 Cardiologist: Glenetta Hew, MD  Chief Complaint   Breathing is better  Patient Profile   Luis [name] is a 51 y.o. male  who is being seen 03/16/2022 for the evaluation of CHF.  Subjective   Significant diuresis yesterday - net negative 4.8L, overall 6.8L negative.  Creatinine has remained stable with diuresis. Potassium 3.9. Noted to have some pruritis around the glans of the penis. Wants to take a shower. Still reports some chest pressure which is constant, but improved. CXR on admission was unremarkable. EKG shows a paced rhythm. LP(a) has resulted negative at 48.7 nmol/L. Blood sugar improved, but remains elevated. Semlee added yesterday.  Objective   Vitals:   03/17/22 0821 03/17/22 1124 03/17/22 2112 03/18/22 0627  BP:  127/73 105/60 139/79  Pulse:  93 92 90  Resp:  18 18 18   Temp:  98.2 F (36.8 C) 98.3 F (36.8 C) (!) 97.5 F (36.4 C)  TempSrc:  Oral Oral Oral  SpO2:  97% 97% 94%  Weight: (!) 138.9 kg   134.6 kg  Height:        Intake/Output Summary (Last 24 hours) at 03/18/2022 1038 Last data filed at 03/18/2022 0631 Gross per 24 hour  Intake 480 ml  Output 4100 ml  Net -3620 ml   Filed Weights   03/16/22 1839 03/17/22 0821 03/18/22 0627  Weight: (!) 138.7 kg (!) 138.9 kg 134.6 kg    Physical Exam   General appearance: alert, mild distress, and morbidly obese Neck: JVD - 5 cm above sternal notch, no carotid bruit, and thyroid not enlarged, symmetric, no tenderness/mass/nodules Lungs: diminished breath sounds bibasilar Heart: regular rate and rhythm Abdomen: morbidly obese Extremities: edema 1+ bilateral LE Pulses: 2+ and symmetric Skin: Skin color, texture, turgor normal. No rashes or lesions or ulcer, examination of the penis notable for some dry skin, smegma, not clearly yeast under the head, no discharge noted Neurologic: Grossly normal Psych:  Frustrated  Inpatient Medications    Scheduled Meds:  carvedilol  12.5 mg Oral BID WC   clopidogrel  75 mg Oral Daily   dapagliflozin propanediol  10 mg Oral Daily   DULoxetine  90 mg Oral Daily   enoxaparin (LOVENOX) injection  0.5 mg/kg Subcutaneous Q24H   furosemide  40 mg Intravenous BID   insulin aspart  0-15 Units Subcutaneous TID WC   insulin glargine-yfgn  20 Units Subcutaneous QHS   pantoprazole  40 mg Oral Daily   potassium chloride  40 mEq Oral Once   prazosin  2 mg Oral QHS   rosuvastatin  20 mg Oral Daily   sacubitril-valsartan  1 tablet Oral BID   sodium chloride flush  3 mL Intravenous Q12H   spironolactone  25 mg Oral Daily   traZODone  200 mg Oral QHS    Continuous Infusions:  sodium chloride      PRN Meds: sodium chloride, acetaminophen, hydrOXYzine, sodium chloride flush   Labs   Results for orders placed or performed during the hospital encounter of 03/16/22 (from the past 48 hour(s))  CBC with Differential     Status: None   Collection Time: 03/16/22  4:29 PM  Result Value Ref Range   WBC 9.7 4.0 - 10.5 K/uL   RBC 4.53 4.22 - 5.81 MIL/uL   Hemoglobin 13.3 13.0 - 17.0 g/dL   HCT 40.6 39.0 - 52.0 %   MCV 89.6 80.0 -  100.0 fL   MCH 29.4 26.0 - 34.0 pg   MCHC 32.8 30.0 - 36.0 g/dL   RDW 13.5 11.5 - 15.5 %   Platelets 251 150 - 400 K/uL   nRBC 0.0 0.0 - 0.2 %   Neutrophils Relative % 62 %   Neutro Abs 5.9 1.7 - 7.7 K/uL   Lymphocytes Relative 28 %   Lymphs Abs 2.7 0.7 - 4.0 K/uL   Monocytes Relative 9 %   Monocytes Absolute 0.9 0.1 - 1.0 K/uL   Eosinophils Relative 1 %   Eosinophils Absolute 0.1 0.0 - 0.5 K/uL   Basophils Relative 0 %   Basophils Absolute 0.0 0.0 - 0.1 K/uL   Immature Granulocytes 0 %   Abs Immature Granulocytes 0.04 0.00 - 0.07 K/uL    Comment: Performed at [facility] [address], [place], [place] [address]  Basic metabolic panel     Status: None   Collection Time: 03/16/22  4:29 PM  Result Value Ref Range    Sodium 139 135 - 145 mmol/L   Potassium 4.0 3.5 - 5.1 mmol/L   Chloride 107 98 - 111 mmol/L   CO2 26 22 - 32 mmol/L   Glucose, Bld 99 70 - 99 mg/dL    Comment: Glucose reference range applies only to samples taken after fasting for at least 8 hours.   BUN 19 6 - 20 mg/dL   Creatinine, Ser 0.83 0.61 - 1.24 mg/dL   Calcium 9.0 8.9 - 10.3 mg/dL   GFR, Estimated >60 >60 mL/min    Comment: (NOTE) Calculated using the CKD-EPI Creatinine Equation (2021)    Anion gap 6 5 - 15    Comment: Performed at [facility] [address], [place], [place] [address]  Brain natriuretic peptide     Status: Abnormal   Collection Time: 03/16/22  4:29 PM  Result Value Ref Range   B Natriuretic Peptide 103.9 (H) 0.0 - 100.0 pg/mL    Comment: Performed at [facility] [address] [place], [place] [address]  Glucose, capillary     Status: None   Collection Time: 03/16/22  6:29 PM  Result Value Ref Range   Glucose-Capillary 96 70 - 99 mg/dL    Comment: Glucose reference range applies only to samples taken after fasting for at least 8 hours.  Brain natriuretic peptide     Status: None   Collection Time: 03/16/22  6:49 PM  Result Value Ref Range   B Natriuretic Peptide 93.3 0.0 - 100.0 pg/mL    Comment: Performed at [facility] [address] [place], [place] [address]  Glucose, capillary     Status: Abnormal   Collection Time: 03/16/22  9:36 PM  Result Value Ref Range   Glucose-Capillary 135 (H) 70 - 99 mg/dL    Comment: Glucose reference range applies only to samples taken after fasting for at least 8 hours.   Comment 1 Notify RN    Comment 2 Document in Chart   Glucose, capillary     Status: Abnormal   Collection Time: 03/17/22  6:05 AM  Result Value Ref Range   Glucose-Capillary 205 (H) 70 - 99 mg/dL    Comment: Glucose reference range applies only to samples taken after fasting for at least 8 hours.   Comment 1 Notify RN    Comment 2 Document in Chart   Basic metabolic  panel     Status: Abnormal   Collection Time:  03/17/22  7:27 AM  Result Value Ref Range   Sodium 135 135 - 145 mmol/L   Potassium 4.0 3.5 - 5.1 mmol/L   Chloride 101 98 - 111 mmol/L   CO2 26 22 - 32 mmol/L   Glucose, Bld 206 (H) 70 - 99 mg/dL    Comment: Glucose reference range applies only to samples taken after fasting for at least 8 hours.   BUN 17 6 - 20 mg/dL   Creatinine, Ser 0.86 0.61 - 1.24 mg/dL   Calcium 9.0 8.9 - 10.3 mg/dL   GFR, Estimated >60 >60 mL/min    Comment: (NOTE) Calculated using the CKD-EPI Creatinine Equation (2021)    Anion gap 8 5 - 15    Comment: Performed at [facility] [address] [place], [place] [address]  Glucose, capillary     Status: Abnormal   Collection Time: 03/17/22 11:27 AM  Result Value Ref Range   Glucose-Capillary 271 (H) 70 - 99 mg/dL    Comment: Glucose reference range applies only to samples taken after fasting for at least 8 hours.   Comment 1 Notify RN    Comment 2 Document in Chart   Glucose, capillary     Status: Abnormal   Collection Time: 03/17/22  4:12 PM  Result Value Ref Range   Glucose-Capillary 160 (H) 70 - 99 mg/dL    Comment: Glucose reference range applies only to samples taken after fasting for at least 8 hours.  Glucose, capillary     Status: Abnormal   Collection Time: 03/17/22  9:10 PM  Result Value Ref Range   Glucose-Capillary 303 (H) 70 - 99 mg/dL    Comment: Glucose reference range applies only to samples taken after fasting for at least 8 hours.   Comment 1 Notify RN    Comment 2 Document in Chart   Basic metabolic panel     Status: Abnormal   Collection Time: 03/18/22  4:57 AM  Result Value Ref Range   Sodium 137 135 - 145 mmol/L   Potassium 3.9 3.5 - 5.1 mmol/L   Chloride 101 98 - 111 mmol/L   CO2 27 22 - 32 mmol/L   Glucose, Bld 239 (H) 70 - 99 mg/dL    Comment: Glucose reference range applies only to samples taken after fasting for at least 8 hours.   BUN 18 6 - 20 mg/dL   Creatinine,  Ser 0.93 0.61 - 1.24 mg/dL   Calcium 8.9 8.9 - 10.3 mg/dL   GFR, Estimated >60 >60 mL/min    Comment: (NOTE) Calculated using the CKD-EPI Creatinine Equation (2021)    Anion gap 9 5 - 15    Comment: Performed at [facility] [address] [place], [place] [address]  Glucose, capillary     Status: Abnormal   Collection Time: 03/18/22  6:24 AM  Result Value Ref Range   Glucose-Capillary 241 (H) 70 - 99 mg/dL    Comment: Glucose reference range applies only to samples taken after fasting for at least 8 hours.   Comment 1 Notify RN    Comment 2 Document in Chart     ECG   Atrial sensed, v-paced rhythm - Personally Reviewed  Telemetry   Paced rhythm - Personally Reviewed  Radiology    DG Chest 2 View  Result Date: 03/16/2022 CLINICAL DATA:  Shortness of breath, history of CHF EXAM: CHEST - 2 VIEW COMPARISON:  02/26/2022 FINDINGS: Unchanged cardiomegaly. Unchanged appearance of left chest cardiac  device and leads. No focal pulmonary opacity. No pleural effusion or pneumothorax. No acute osseous abnormality. IMPRESSION: Cardiomegaly without additional acute cardiopulmonary process. Electronically Signed   By: Merilyn Baba M.D.   On: 03/16/2022 16:54    Cardiac Studies   N/A  Assessment   Principal Problem:   Acute on chronic combined systolic and diastolic CHF (congestive heart failure) (HCC) BIV-AICD near ERI  Plan   Continues to have an excellent response to diuretics. Would continue IV lasix at current rate - renal function is stable. Vitals stable. Potassium has been essentially stable (3.9 today) - was around 4.0. I don't see overwhelming evidence to replete it yet, especially since the decline in potassium is minimal given the extensive urine output. Magnesium has been ordered by pharmacy - no evidence he is having arrhythmias. Will follow-up on this. Ok to continue [place]- this does not look like UTI/urinary yeast infection. Will send clean catch urine today. Ok  to allow the patient to shower today. Advise application of topical nystatin powder to groin and shaft of the penis after drying off daily. Will increase Semlee to 30U QHS. Add novolog 5 units TID coverage per diabetes coordinator.  Time Spent Directly with Patient:  I have spent a total of 35 minutes with the patient reviewing hospital notes, telemetry, EKGs, labs and examining the patient as well as establishing an assessment and plan that was discussed personally with the patient.  > 50% of time was spent in direct patient care.  Length of Stay:  LOS: 2 days   Pixie Casino, MD, [facility], [place] Director of the Advanced Lipid Disorders &  Cardiovascular Risk Reduction Clinic Diplomate of the American Board of Clinical Lipidology Attending Cardiologist  Direct Dial: [phone]  Fax: [phone]  Website:  [email or web address] [email or web address] Ast 03/18/2022, 10:38 AM

## 2022-03-18 NOTE — Discharge Instructions (Addendum)
Post procedure wound care instructions (defibrillator) Keep incision clean and dry for 10 days. No driving for 2 days.  Leave steri-strips (little pieces of tape) on until seen in the office for wound check appointment. Call the office [phone]) for redness, drainage, swelling, or fever.     Heart Failure Education: Weigh yourself EVERY morning after you go to the bathroom but before you eat or drink anything. Write this number down in a weight log/diary. If you gain 3 pounds overnight or 5 pounds in a week, call the office. Take your medicines as prescribed. If you have concerns about your medications, please call us before you stop taking them.  Eat low salt foods--Limit salt (sodium) to 2000 mg per day. This will help prevent your body from holding onto fluid. Read food labels as many processed foods have a lot of sodium, especially canned goods and prepackaged meats. If you would like some assistance choosing low sodium foods, we would be happy to set you up with a nutritionist. Limit all fluids for the day to less than 2 liters (64 ounces). Fluid includes all drinks, coffee, juice, ice chips, soup, jello, and all other liquids. Stay as active as you can everyday. Staying active will give you more energy and make your muscles stronger. Start with 5 minutes at a time and work your way up to 30 minutes a day. Break up your activities--do some in the morning and some in the afternoon. Start with 3 days per week and work your way up to 5 days as you can.  If you have chest pain, feel short of breath, dizzy, or lightheaded, STOP. If you don't feel better after a short rest, call 911. If you do feel better, call the office to let us know you have symptoms with exercise. _______________  Local Endocrinologists  [facility] [phone]) - Placed Referral Here Dr. Philemon Kingdom Dr. Elayne Snare [facility] [phone]) Dr. Delrae Rend [facility] [phone]) Dr. Jacelyn Pi Dr. Madelin Rear [facility] [phone]- [phone]) Dr. Daneil Dolin End[facility] [phone]) [place] office]  [phone]) Shari Prows office] Dr. Lavone Orn Dr. Mee Hives  [facility]) [phone]) Autumn Barbaraann Barthel Ronnald Ramp), PA Dr. Amalia Greenhouse Dr. Marsh Dolly.  Dr. Salomon Fick (private practice) ([address]) [phone]) [facility]) ([place]) [phone]) Dr. Flo Shanks, NP Liam Rogers, PA Dr. Legrand Como [place] Dr. Ilona Sorrel

## 2022-03-19 DIAGNOSIS — R052 Subacute cough: Secondary | ICD-10-CM | POA: Diagnosis not present

## 2022-03-19 DIAGNOSIS — I5043 Acute on chronic combined systolic (congestive) and diastolic (congestive) heart failure: Secondary | ICD-10-CM | POA: Diagnosis not present

## 2022-03-19 DIAGNOSIS — L293 Anogenital pruritus, unspecified: Secondary | ICD-10-CM | POA: Diagnosis not present

## 2022-03-19 LAB — BASIC METABOLIC PANEL
Anion gap: 8 (ref 5–15)
BUN: 20 mg/dL (ref 6–20)
CO2: 27 mmol/L (ref 22–32)
Calcium: 8.9 mg/dL (ref 8.9–10.3)
Chloride: 101 mmol/L (ref 98–111)
Creatinine, Ser: 0.98 mg/dL (ref 0.61–1.24)
GFR, Estimated: >60 mL/min (ref >60)
Glucose, Bld: 187 mg/dL — ABNORMAL HIGH (ref 70–99)
Potassium: 3.9 mmol/L (ref 3.5–5.1)
Sodium: 136 mmol/L (ref 135–145)

## 2022-03-19 LAB — GLUCOSE, CAPILLARY
Glucose-Capillary: 190 mg/dL — ABNORMAL HIGH (ref 70–99)
Glucose-Capillary: 281 mg/dL — ABNORMAL HIGH (ref 70–99)
Glucose-Capillary: 147 mg/dL — ABNORMAL HIGH (ref 70–99)
Glucose-Capillary: 262 mg/dL — ABNORMAL HIGH (ref 70–99)

## 2022-03-19 LAB — MAGNESIUM: Magnesium: 2.2 mg/dL (ref 1.7–2.4)

## 2022-03-19 MED ORDER — INSULIN GLARGINE-YFGN 100 UNIT/ML ~~LOC~~ SOLN
35.0000 [IU] | Freq: Every day | SUBCUTANEOUS | Status: DC
  Administered 2022-03-19: 35 [IU] via SUBCUTANEOUS
  Filled 2022-03-19 (×2): qty 0.35

## 2022-03-19 MED ORDER — POTASSIUM CHLORIDE CRYS ER 20 MEQ PO TBCR
40.0000 meq | EXTENDED_RELEASE_TABLET | Freq: Once | ORAL | Status: AC
  Administered 2022-03-19: 40 meq via ORAL
  Filled 2022-03-19: qty 2

## 2022-03-19 MED ORDER — INSULIN ASPART 100 UNIT/ML IJ SOLN
0.0000 [IU] | Freq: Three times a day (TID) | INTRAMUSCULAR | Status: DC
  Administered 2022-03-19: 8 [IU] via SUBCUTANEOUS
  Administered 2022-03-19: 2 [IU] via SUBCUTANEOUS
  Administered 2022-03-19: 8 [IU] via SUBCUTANEOUS
  Administered 2022-03-20: 5 [IU] via SUBCUTANEOUS
  Administered 2022-03-20 (×2): 3 [IU] via SUBCUTANEOUS
  Administered 2022-03-20: 5 [IU] via SUBCUTANEOUS
  Administered 2022-03-21: 2 [IU] via SUBCUTANEOUS
  Administered 2022-03-21: 3 [IU] via SUBCUTANEOUS
  Administered 2022-03-21: 5 [IU] via SUBCUTANEOUS
  Administered 2022-03-21 – 2022-03-22 (×2): 3 [IU] via SUBCUTANEOUS
  Administered 2022-03-22 (×2): 5 [IU] via SUBCUTANEOUS
  Administered 2022-03-23: 15 [IU] via SUBCUTANEOUS
  Administered 2022-03-23: 2 [IU] via SUBCUTANEOUS
  Administered 2022-03-24: 3 [IU] via SUBCUTANEOUS
  Administered 2022-03-24 (×3): 5 [IU] via SUBCUTANEOUS
  Administered 2022-03-25: 8 [IU] via SUBCUTANEOUS
  Administered 2022-03-25: 3 [IU] via SUBCUTANEOUS

## 2022-03-19 MED ORDER — GUAIFENESIN-CODEINE 100-10 MG/5ML PO SOLN
5.0000 mL | Freq: Every day | ORAL | Status: DC
  Administered 2022-03-19 – 2022-03-24 (×6): 5 mL via ORAL
  Filled 2022-03-19 (×6): qty 5

## 2022-03-19 NOTE — Progress Notes (Signed)
Pt off tele for a shower.

## 2022-03-19 NOTE — Care Management Important Message (Signed)
Important Message  Patient Details  Name: Luis [name] MRN: [id number] Date of Birth: [birth year]   Medicare Important Message Given:  Yes     Shelda Altes 03/19/2022, 10:47 AM

## 2022-03-19 NOTE — Progress Notes (Signed)
DAILY PROGRESS NOTE   Patient Name: Luis [name] Date of Encounter: 03/19/2022 Cardiologist: Bryan Lemma, MD  Chief Complaint   Breathing is better  Patient Profile   Luis OROURKE is a 51 y.o. male  who is being seen 03/16/2022 for the evaluation of CHF.  Subjective   Diuresis is slowing but still adequate - now 1.8L more negative, overall -8.6L. Blood sugar still not optimized, appreciate DM recs - will make further adjustments. Creatinine stable with diuresis. Potassium 3.9 (Stable) - magnesium 2.2 (as expected). Weight was recorded higher today -?accuracy.  Objective   Vitals:   03/18/22 1515 03/18/22 1942 03/19/22 0613 03/19/22 0901  BP: 123/77 133/75 137/64   Pulse: 85 91 75   Resp: 20 20 20 18   Temp: 98.6 F (37 C) 98.1 F (36.7 C) 98.5 F (36.9 C) 97.6 F (36.4 C)  TempSrc: Oral Oral Oral Oral  SpO2:  98% 99% 98%  Weight:   135.8 kg   Height:        Intake/Output Summary (Last 24 hours) at 03/19/2022 0941 Last data filed at 03/18/2022 2100 Gross per 24 hour  Intake 240 ml  Output 2075 ml  Net -1835 ml   Filed Weights   03/17/22 0821 03/18/22 0627 03/19/22 03/21/22  Weight: (!) 138.9 kg 134.6 kg 135.8 kg    Physical Exam   General appearance: alert, mild distress, and morbidly obese Neck: JVD - 3 cm above sternal notch, no carotid bruit, and thyroid not enlarged, symmetric, no tenderness/mass/nodules Lungs: diminished breath sounds bibasilar Heart: regular rate and rhythm Abdomen: morbidly obese Extremities: edema 1+ bilateral LE Pulses: 2+ and symmetric Skin: Skin color, texture, turgor normal. No rashes or lesions or ulcer, examination of the penis notable for some dry skin, smegma, not clearly yeast under the head, no discharge noted Neurologic: Grossly normal Psych: Frustrated  Inpatient Medications    Scheduled Meds:  carvedilol  12.5 mg Oral BID WC   clopidogrel  75 mg Oral Daily   dapagliflozin propanediol  10 mg Oral  Daily   DULoxetine  90 mg Oral Daily   enoxaparin (LOVENOX) injection  0.5 mg/kg Subcutaneous Q24H   furosemide  40 mg Intravenous BID   insulin aspart  0-15 Units Subcutaneous TID AC & HS   insulin aspart  5 Units Subcutaneous TID WC   insulin glargine-yfgn  35 Units Subcutaneous QHS   nystatin   Topical BID   pantoprazole  40 mg Oral Daily   prazosin  2 mg Oral QHS   rosuvastatin  20 mg Oral Daily   sacubitril-valsartan  1 tablet Oral BID   sodium chloride flush  3 mL Intravenous Q12H   spironolactone  25 mg Oral Daily   traZODone  200 mg Oral QHS    Continuous Infusions:  sodium chloride      PRN Meds: sodium chloride, acetaminophen, hydrOXYzine, sodium chloride flush   Labs   Results for orders placed or performed during the hospital encounter of 03/16/22 (from the past 48 hour(s))  Glucose, capillary     Status: Abnormal   Collection Time: 03/17/22 11:27 AM  Result Value Ref Range   Glucose-Capillary 271 (H) 70 - 99 mg/dL    Comment: Glucose reference range applies only to samples taken after fasting for at least 8 hours.   Comment 1 Notify RN    Comment 2 Document in Chart   Glucose, capillary     Status: Abnormal   Collection Time: 03/17/22  4:12 PM  Result Value Ref Range   Glucose-Capillary 160 (H) 70 - 99 mg/dL    Comment: Glucose reference range applies only to samples taken after fasting for at least 8 hours.  Glucose, capillary     Status: Abnormal   Collection Time: 03/17/22  9:10 PM  Result Value Ref Range   Glucose-Capillary 303 (H) 70 - 99 mg/dL    Comment: Glucose reference range applies only to samples taken after fasting for at least 8 hours.   Comment 1 Notify RN    Comment 2 Document in Chart   Basic metabolic panel     Status: Abnormal   Collection Time: 03/18/22  4:57 AM  Result Value Ref Range   Sodium 137 135 - 145 mmol/L   Potassium 3.9 3.5 - 5.1 mmol/L   Chloride 101 98 - 111 mmol/L   CO2 27 22 - 32 mmol/L   Glucose, Bld 239 (H) 70 - 99  mg/dL    Comment: Glucose reference range applies only to samples taken after fasting for at least 8 hours.   BUN 18 6 - 20 mg/dL   Creatinine, Ser 0.93 0.61 - 1.24 mg/dL   Calcium 8.9 8.9 - 10.3 mg/dL   GFR, Estimated >60 >60 mL/min    Comment: (NOTE) Calculated using the CKD-EPI Creatinine Equation (2021)    Anion gap 9 5 - 15    Comment: Performed at [facility] [address], [place], [place] [address]  Glucose, capillary     Status: Abnormal   Collection Time: 03/18/22  6:24 AM  Result Value Ref Range   Glucose-Capillary 241 (H) 70 - 99 mg/dL    Comment: Glucose reference range applies only to samples taken after fasting for at least 8 hours.   Comment 1 Notify RN    Comment 2 Document in Chart   Glucose, capillary     Status: Abnormal   Collection Time: 03/18/22 11:50 AM  Result Value Ref Range   Glucose-Capillary 163 (H) 70 - 99 mg/dL    Comment: Glucose reference range applies only to samples taken after fasting for at least 8 hours.  Glucose, capillary     Status: Abnormal   Collection Time: 03/18/22  4:25 PM  Result Value Ref Range   Glucose-Capillary 164 (H) 70 - 99 mg/dL    Comment: Glucose reference range applies only to samples taken after fasting for at least 8 hours.  Glucose, capillary     Status: Abnormal   Collection Time: 03/18/22  9:26 PM  Result Value Ref Range   Glucose-Capillary 286 (H) 70 - 99 mg/dL    Comment: Glucose reference range applies only to samples taken after fasting for at least 8 hours.  Glucose, capillary     Status: Abnormal   Collection Time: 03/19/22  6:11 AM  Result Value Ref Range   Glucose-Capillary 190 (H) 70 - 99 mg/dL    Comment: Glucose reference range applies only to samples taken after fasting for at least 8 hours.   Comment 1 Notify RN    Comment 2 Document in Chart   Basic metabolic panel     Status: Abnormal   Collection Time: 03/19/22  6:22 AM  Result Value Ref Range   Sodium 136 135 - 145 mmol/L   Potassium  3.9 3.5 - 5.1 mmol/L   Chloride 101 98 - 111 mmol/L   CO2 27 22 - 32 mmol/L   Glucose, Bld 187 (H) 70 - 99 mg/dL  Comment: Glucose reference range applies only to samples taken after fasting for at least 8 hours.   BUN 20 6 - 20 mg/dL   Creatinine, Ser 7.34 0.61 - 1.24 mg/dL   Calcium 8.9 8.9 - 28.7 mg/dL   GFR, Estimated >68 >11 mL/min    Comment: (NOTE) Calculated using the CKD-EPI Creatinine Equation (2021)    Anion gap 8 5 - 15    Comment: Performed at [facility], [address] [place], [place] [address]  Magnesium     Status: None   Collection Time: 03/19/22  6:22 AM  Result Value Ref Range   Magnesium 2.2 1.7 - 2.4 mg/dL    Comment: Performed at [facility], [address], [place], [place] [address]    ECG   Atrial sensed, v-paced rhythm - Personally Reviewed  Telemetry   Paced rhythm - Personally Reviewed  Radiology    No results found.  Cardiac Studies   N/A  Assessment   Principal Problem:   Acute on chronic combined systolic and diastolic CHF (congestive heart failure) (HCC) BIV-AICD near ERI  Plan   Diuresis appears to be slowing - will plan to continue IV diuretics today and probably transition to oral diuretics tomorrow. Notes improvement in genital pruritis with topical nystatin powder. Blood glucose not optimize, continue to titrate insulin - on Farxiga. Vitals stable. Reports he has had a cough for several weeks, worse at night and c/o left ear pain. Will add cough syrup at night -CXR was unremarkable.  Time Spent Directly with Patient:  I have spent a total of 25 minutes with the patient reviewing hospital notes, telemetry, EKGs, labs and examining the patient as well as establishing an assessment and plan that was discussed personally with the patient.  > 50% of time was spent in direct patient care.  Length of Stay:  LOS: 3 days   Chrystie Nose, MD, [facility], FACP  [place]  [facility]  Medical Director of the  Advanced Lipid Disorders &  Cardiovascular Risk Reduction Clinic Diplomate of the American Board of Clinical Lipidology Attending Cardiologist  Direct Dial: [phone]  Fax: [phone]  Website:  [email or web address] 03/19/2022, 9:41 AM

## 2022-03-19 NOTE — Inpatient Diabetes Management (Signed)
Inpatient Diabetes Program Recommendations  AACE/ADA: New Consensus Statement on Inpatient Glycemic Control (2015)  Target Ranges:  Prepandial:   less than 140 mg/dL      Peak postprandial:   less than 180 mg/dL (1-2 hours)      Critically ill patients:  140 - 180 mg/dL   Lab Results  Component Value Date   GLUCAP 190 (H) 03/19/2022   HGBA1C 9.1 (H) 02/26/2022    Review of Glycemic Control  Latest Reference Range & Units 03/18/22 06:24 03/18/22 11:50 03/18/22 16:25 03/18/22 21:26 03/19/22 06:11  Glucose-Capillary 70 - 99 mg/dL 241 (H) 163 (H) 164 (H) 286 (H) 190 (H)   Diabetes history: DM 2 Outpatient Diabetes medications: Farxiga 10 mg Daily, 75/25 70 units tid, Trulicity 1.5 mg Weekly Current orders for Inpatient glycemic control:  Semglee 30 units qhs Farxiga 10 mg Daily Novolog 0-15 units tid Novolog 5 units tid meal coverage  A1c 9.1% on 8/31  Inpatient Diabetes Program Recommendations:    -   Increase Semglee 35 units -   Add Novolog hs scale  Thanks,  Tama Headings RN, MSN, BC-ADM Inpatient Diabetes Coordinator Team Pager [phone] (8a-5p)

## 2022-03-19 NOTE — Progress Notes (Signed)
Heart Failure Stewardship Pharmacist Progress Note   PCP: Buzzy Han, MD (Inactive) PCP-Cardiologist: Glenetta Hew, MD    HPI:  51 yo M with PMH of HTN, T2DM, HLD, HFrEF since ~2000, polysubstance use, OSA on CPAP, and PPM.    He presented to the ED on 02/26/22 with chest pain, shortness of breath, and LE edema. He reports he took 320 mg of lasix without any improvement. CXR with mild vascular congestion without pulmonary edema. ECHO on 9/1 showed LVEF 25%, RV mildly reduced. [place] on 9/8 with relatively normal filling pressures and mildly reduced cardiac output (RA 8, PA 22, wedge 13, CO 4.3, CI 1.8). He was discharged on 9/10 with carvedilol, Farxiga, lasix, Entresto and spironolactone.  He had hospital follow up with [facility] on 9/18 and felt lightheadedness and reported falls. Also complains of orthopnea, weight gain of 18 lbs, LE edema, fatigue, chest discomfort, and shortness of breath. He tried additional 80 mg of lasix without any improvement. Sent to ED for evaluation and admitted for IV diuresis. CXR with cardiomegaly.  Current HF Medications: Diuretic: furosemide 40 mg IV BID Beta Blocker: carvedilol 12.5 mg BID ACE/ARB/ARNI: Entresto 24/26 mg BID MRA: spironolactone 25 mg daily SGLT2i: Farxiga 10 mg daily  Prior to admission HF Medications: Diuretic: furosemide 80 mg daily Beta blocker: carvedilol 12.5 mg BID ACE/ARB/ARNI: Entresto 24/26 mg BID MRA: spironolactone 25 mg daily SGLT2i: Farxiga 10 mg daily  Pertinent Lab Values: Serum creatinine 0.98, BUN 20, Potassium 3.9, Sodium 136, Magnesium 2.2, BNP 103.9, A1c 9.1   Vital Signs: Weight: 299 lbs (admission weight: 305 lbs) Blood pressure: 120-130/70s  Heart rate: 80s  I/O: -3L yesterday; net -8.7L  Medication Assistance / Insurance Benefits Check: Does the patient have prescription insurance?  Yes Type of insurance plan: [place] Medicaid   Outpatient Pharmacy:  [facility] to admission [facility] pharmacy:  [facility] Is the patient willing to use [place] pharmacy at discharge? Yes Is the patient willing to transition their outpatient pharmacy to utilize a [facility] outpatient pharmacy?   Pending    Assessment: 1. Acute on chronic systolic CHF (LVEF 47%), due to NICM. NYHA class III symptoms. Dizziness and lightheadedness symptoms have improved.  - On furosemide 40 mg IV BID. Remains volume overloaded on exam. Question accuracy of weight today. Strict I/O and daily weights. Goal K>4, recommend another 40 mEq x 1 with continued IV diuresis. Keep Mag>2. - Continue carvedilol 12.5 mg BID - Continue Entresto 24/26 mg BID - Continue spironolactone 25 mg daily - Continue Farxiga 10 mg daily. Reports improvement in symptoms with nystatin.    Plan: 1) Medication changes recommended at this time: - KCl 40 mEq x 1  2) Patient assistance: - Has [place] Medicaid  3)  Education  - Patient has been educated on current HF medications and potential additions to HF medication regimen - Patient verbalizes understanding that over the next few months, these medication doses may change and more medications may be added to optimize HF regimen - Patient has been educated on basic disease state pathophysiology and goals of therapy   Kerby Nora, PharmD, BCPS Heart Failure Stewardship Pharmacist Phone [phone]

## 2022-03-19 NOTE — Progress Notes (Addendum)
CARDIAC REHAB PHASE I   PRE:  Rate/Rhythm: 90 NS V paced  BP:  Sitting: 137/75      SaO2: 97 2L  MODE:  Ambulation: 306 ft   POST:  Rate/Rhythm: 96 NS V paced  BP:  Sitting: 121/66      SaO2: 99 2L   Pt received from bed and ambulated in hallway with RW and 3L of O2. During ambulation at 161ft pt felt "weak" and started seeing "spots" A chair was quickly brought to the pt so he could have a seated rest break. Pt O2 was turned up to 4L. When pt was ready we began walking back to the room with instructions to stay close to the wall. Pt walked another 80ft before needing another break, O2 was turned up to 5L before returning back to room. Pt was weaned down back to 2L after sitting on his bed. Pt reported that he felt weak and tired after the walk but no longer dizzy. Pt was educated on the importance of daily weights, reporting how he's feeling, and low na diet. Pt would like to join CRPII to get stronger.   [id number]  Luis [name]  10:37 AM 03/19/2022

## 2022-03-20 DIAGNOSIS — I5043 Acute on chronic combined systolic (congestive) and diastolic (congestive) heart failure: Secondary | ICD-10-CM | POA: Diagnosis not present

## 2022-03-20 LAB — GLUCOSE, CAPILLARY
Glucose-Capillary: 207 mg/dL — ABNORMAL HIGH (ref 70–99)
Glucose-Capillary: 200 mg/dL — ABNORMAL HIGH (ref 70–99)
Glucose-Capillary: 153 mg/dL — ABNORMAL HIGH (ref 70–99)
Glucose-Capillary: 227 mg/dL — ABNORMAL HIGH (ref 70–99)

## 2022-03-20 LAB — BASIC METABOLIC PANEL
Anion gap: 7 (ref 5–15)
BUN: 24 mg/dL — ABNORMAL HIGH (ref 6–20)
CO2: 27 mmol/L (ref 22–32)
Calcium: 9 mg/dL (ref 8.9–10.3)
Chloride: 100 mmol/L (ref 98–111)
Creatinine, Ser: 1.03 mg/dL (ref 0.61–1.24)
GFR, Estimated: >60 mL/min (ref >60)
Glucose, Bld: 192 mg/dL — ABNORMAL HIGH (ref 70–99)
Potassium: 4.1 mmol/L (ref 3.5–5.1)
Sodium: 134 mmol/L — ABNORMAL LOW (ref 135–145)

## 2022-03-20 MED ORDER — INSULIN ASPART 100 UNIT/ML IJ SOLN
7.0000 [IU] | Freq: Three times a day (TID) | INTRAMUSCULAR | Status: DC
  Administered 2022-03-21 – 2022-03-25 (×12): 7 [IU] via SUBCUTANEOUS

## 2022-03-20 MED ORDER — INSULIN GLARGINE-YFGN 100 UNIT/ML ~~LOC~~ SOLN
38.0000 [IU] | Freq: Every day | SUBCUTANEOUS | Status: DC
  Administered 2022-03-20 – 2022-03-24 (×5): 38 [IU] via SUBCUTANEOUS
  Filled 2022-03-20 (×7): qty 0.38

## 2022-03-20 MED ORDER — FUROSEMIDE 40 MG PO TABS
80.0000 mg | ORAL_TABLET | Freq: Every day | ORAL | Status: DC
  Administered 2022-03-21 – 2022-03-23 (×3): 80 mg via ORAL
  Filled 2022-03-20 (×3): qty 2

## 2022-03-20 NOTE — Inpatient Diabetes Management (Signed)
Inpatient Diabetes Program Recommendations  AACE/ADA: New Consensus Statement on Inpatient Glycemic Control   Target Ranges:  Prepandial:   less than 140 mg/dL      Peak postprandial:   less than 180 mg/dL (1-2 hours)      Critically ill patients:  140 - 180 mg/dL    Latest Reference Range & Units 03/20/22 04:29 03/20/22 11:57  Glucose-Capillary 70 - 99 mg/dL 207 (H) 200 (H)    Latest Reference Range & Units 03/19/22 06:11 03/19/22 10:41 03/19/22 15:55 03/19/22 20:52  Glucose-Capillary 70 - 99 mg/dL 190 (H) 281 (H) 147 (H) 262 (H)   Review of Glycemic Control  Diabetes history: DM2 Outpatient Diabetes medications: Humalog 75/25 70 units TID, Trulicity 1.5 mg Qweek, Farxiga 10 mg daily, Lantus 50 units BID Current orders for Inpatient glycemic control: Semglee 35 units QHS, Novolog 0-15 units AC&HS, Novolog 5 units TID with meals  Inpatient Diabetes Program Recommendations:    Insulin: Please consider increasing Semglee to 38 units QHS and meal coverage to Novolog 7 units TID with meals.  Thanks, Barnie Alderman, RN, MSN, [place] Diabetes Coordinator Inpatient Diabetes Program [phone] (Team Pager from 8am to [place])

## 2022-03-20 NOTE — Progress Notes (Signed)
Heart Failure Stewardship Pharmacist Progress Note   PCP: Buzzy Han, MD (Inactive) PCP-Cardiologist: Glenetta Hew, MD    HPI:  51 yo M with PMH of HTN, T2DM, HLD, HFrEF since ~2000, polysubstance use, OSA on CPAP, and PPM.    He presented to the ED on 02/26/22 with chest pain, shortness of breath, and LE edema. He reports he took 320 mg of lasix without any improvement. CXR with mild vascular congestion without pulmonary edema. ECHO on 9/1 showed LVEF 25%, RV mildly reduced. [place] on 9/8 with relatively normal filling pressures and mildly reduced cardiac output (RA 8, PA 22, wedge 13, CO 4.3, CI 1.8). He was discharged on 9/10 with carvedilol, Farxiga, lasix, Entresto and spironolactone.  He had hospital follow up with [facility] on 9/18 and felt lightheadedness and reported falls. Also complains of orthopnea, weight gain of 18 lbs, LE edema, fatigue, chest discomfort, and shortness of breath. He tried additional 80 mg of lasix without any improvement. Sent to ED for evaluation and admitted for IV diuresis. CXR with cardiomegaly.  Current HF Medications: Diuretic: furosemide 80 mg PO daily Beta Blocker: carvedilol 12.5 mg BID ACE/ARB/ARNI: Entresto 24/26 mg BID MRA: spironolactone 25 mg daily SGLT2i: Farxiga 10 mg daily  Prior to admission HF Medications: Diuretic: furosemide 80 mg daily Beta blocker: carvedilol 12.5 mg BID ACE/ARB/ARNI: Entresto 24/26 mg BID MRA: spironolactone 25 mg daily SGLT2i: Farxiga 10 mg daily  Pertinent Lab Values: Serum creatinine 1.03, BUN 24, Potassium 4.1, Sodium 134, Magnesium 2.2, BNP 103.9, A1c 9.1   Vital Signs: Weight: 297 lbs (admission weight: 305 lbs) Blood pressure: 120/70s  Heart rate: 80s  I/O: -1.7L yesterday; net -10.9L  Medication Assistance / Insurance Benefits Check: Does the patient have prescription insurance?  Yes Type of insurance plan: [place] Medicaid   Outpatient Pharmacy:  [facility] admission [facility] Is the patient willing to use [place] pharmacy at discharge? Yes Is the patient willing to transition their outpatient pharmacy to utilize a [facility] outpatient pharmacy?   Pending    Assessment: 1. Acute on chronic systolic CHF (LVEF 83%), due to NICM. NYHA class II symptoms. Reported dizziness overnight.  - Agree with transitioning to furosemide 80 mg PO daily. Strict I/O and daily weights. Keep K>4 and Mag>2. - Continue carvedilol 12.5 mg BID - Continue Entresto 24/26 mg BID - Continue spironolactone 25 mg daily - Continue Farxiga 10 mg daily. Reports improvement in symptoms with nystatin.    Plan: 1) Medication changes recommended at this time: - Agree with changes   2) Patient assistance: - Has [place] Medicaid  3)  Education  - Patient has been educated on current HF medications and potential additions to HF medication regimen - Patient verbalizes understanding that over the next few months, these medication doses may change and more medications may be added to optimize HF regimen - Patient has been educated on basic disease state pathophysiology and goals of therapy   Kerby Nora, PharmD, BCPS Heart Failure Stewardship Pharmacist Phone [phone]

## 2022-03-20 NOTE — TOC Progression Note (Signed)
Transition of Care [facility]) - Progression Note    Patient Details  Name: Luis [name] MRN: [id number] Date of Birth: 1971/04/13  [facility]) CM/SW Contact  Zenon Mayo, RN Phone Number: 03/20/2022, 4:37 PM  Clinical Narrative:    NCM received consult to Please call Regena Prister [phone]  Needs copy of medical records for [place] claims  .  This NCM called , left message for a return call.  Will need to inform her to go thru medical records to get this information, we do not release medical information.          Expected Discharge Plan and Services                                                 Social Determinants of Health (SDOH) Interventions Food Insecurity Interventions: Intervention Not Indicated Housing Interventions: Intervention Not Indicated Transportation Interventions: Intervention Not Indicated Utilities Interventions: Intervention Not Indicated Alcohol Usage Interventions: Intervention Not Indicated (Score <7) Financial Strain Interventions: Intervention Not Indicated  Readmission Risk Interventions    01/31/2021   12:26 PM 01/31/2021    9:25 AM  Readmission Risk Prevention Plan  Transportation Screening Complete   PCP or Specialist Appt within 3-5 Days Complete Complete  HRI or Home Care Consult Complete Complete  Social Work Consult for [place] Planning/Counseling Patient refused Complete  Palliative Care Screening Not Applicable Not Applicable  Medication Review Press photographer) Complete Referral to Pharmacy

## 2022-03-20 NOTE — Progress Notes (Signed)
DAILY PROGRESS NOTE   Patient Name: Luis [name] Date of Encounter: 03/20/2022 Cardiologist: Glenetta Hew, MD  Chief Complaint   No complaints  Patient Profile   Luis [name] is a 51 y.o. male  who is being seen 03/16/2022 for the evaluation of CHF.  Subjective   Net negative 2L yesterday - now 10L negative. Creatinine trended up slightly to 1.03 today. BUN up to 24. Weight stable.  Objective   Vitals:   03/19/22 2255 03/19/22 2300 03/20/22 0406 03/20/22 0817  BP:   113/66 120/71  Pulse: 83 83 87   Resp: 18 18 18    Temp:   97.8 F (36.6 C)   TempSrc:   Oral   SpO2: 97% 97% 93%   Weight:   135.1 kg   Height:        Intake/Output Summary (Last 24 hours) at 03/20/2022 0953 Last data filed at 03/20/2022 [id number] Gross per 24 hour  Intake 477 ml  Output 2725 ml  Net -2248 ml   Filed Weights   03/18/22 0627 03/19/22 0613 03/20/22 0406  Weight: 134.6 kg 135.8 kg 135.1 kg    Physical Exam   General appearance: alert, mild distress, and morbidly obese Neck: JVD - 1 cm above sternal notch, no carotid bruit, and thyroid not enlarged, symmetric, no tenderness/mass/nodules Lungs: diminished breath sounds bibasilar Heart: regular rate and rhythm Abdomen: morbidly obese Extremities: edema trace bilateral LE Pulses: 2+ and symmetric Skin: Skin color, texture, turgor normal. No rashes or lesions Neurologic: Grossly normal Psych: Pleasant  Inpatient Medications    Scheduled Meds:  carvedilol  12.5 mg Oral BID WC   clopidogrel  75 mg Oral Daily   dapagliflozin propanediol  10 mg Oral Daily   DULoxetine  90 mg Oral Daily   enoxaparin (LOVENOX) injection  0.5 mg/kg Subcutaneous Q24H   furosemide  40 mg Intravenous BID   guaiFENesin-codeine  5 mL Oral QHS   insulin aspart  0-15 Units Subcutaneous TID AC & HS   insulin aspart  5 Units Subcutaneous TID WC   insulin glargine-yfgn  35 Units Subcutaneous QHS   nystatin   Topical BID   pantoprazole  40 mg  Oral Daily   prazosin  2 mg Oral QHS   rosuvastatin  20 mg Oral Daily   sacubitril-valsartan  1 tablet Oral BID   sodium chloride flush  3 mL Intravenous Q12H   spironolactone  25 mg Oral Daily   traZODone  200 mg Oral QHS    Continuous Infusions:  sodium chloride      PRN Meds: sodium chloride, acetaminophen, hydrOXYzine, sodium chloride flush   Labs   Results for orders placed or performed during the hospital encounter of 03/16/22 (from the past 48 hour(s))  Glucose, capillary     Status: Abnormal   Collection Time: 03/18/22 11:50 AM  Result Value Ref Range   Glucose-Capillary 163 (H) 70 - 99 mg/dL    Comment: Glucose reference range applies only to samples taken after fasting for at least 8 hours.  Glucose, capillary     Status: Abnormal   Collection Time: 03/18/22  4:25 PM  Result Value Ref Range   Glucose-Capillary 164 (H) 70 - 99 mg/dL    Comment: Glucose reference range applies only to samples taken after fasting for at least 8 hours.  Glucose, capillary     Status: Abnormal   Collection Time: 03/18/22  9:26 PM  Result Value Ref Range   Glucose-Capillary 286 (H)  70 - 99 mg/dL    Comment: Glucose reference range applies only to samples taken after fasting for at least 8 hours.  Glucose, capillary     Status: Abnormal   Collection Time: 03/19/22  6:11 AM  Result Value Ref Range   Glucose-Capillary 190 (H) 70 - 99 mg/dL    Comment: Glucose reference range applies only to samples taken after fasting for at least 8 hours.   Comment 1 Notify RN    Comment 2 Document in Chart   Basic metabolic panel     Status: Abnormal   Collection Time: 03/19/22  6:22 AM  Result Value Ref Range   Sodium 136 135 - 145 mmol/L   Potassium 3.9 3.5 - 5.1 mmol/L   Chloride 101 98 - 111 mmol/L   CO2 27 22 - 32 mmol/L   Glucose, Bld 187 (H) 70 - 99 mg/dL    Comment: Glucose reference range applies only to samples taken after fasting for at least 8 hours.   BUN 20 6 - 20 mg/dL    Creatinine, Ser 0.15 0.61 - 1.24 mg/dL   Calcium 8.9 8.9 - 86.8 mg/dL   GFR, Estimated >25 >74 mL/min    Comment: (NOTE) Calculated using the CKD-EPI Creatinine Equation (2021)    Anion gap 8 5 - 15    Comment: Performed at [facility], [address], [place], [place] [address]  Magnesium     Status: None   Collection Time: 03/19/22  6:22 AM  Result Value Ref Range   Magnesium 2.2 1.7 - 2.4 mg/dL    Comment: Performed at [facility], [address], [place], [place] [address]  Glucose, capillary     Status: Abnormal   Collection Time: 03/19/22 10:41 AM  Result Value Ref Range   Glucose-Capillary 281 (H) 70 - 99 mg/dL    Comment: Glucose reference range applies only to samples taken after fasting for at least 8 hours.  Glucose, capillary     Status: Abnormal   Collection Time: 03/19/22  3:55 PM  Result Value Ref Range   Glucose-Capillary 147 (H) 70 - 99 mg/dL    Comment: Glucose reference range applies only to samples taken after fasting for at least 8 hours.  Glucose, capillary     Status: Abnormal   Collection Time: 03/19/22  8:52 PM  Result Value Ref Range   Glucose-Capillary 262 (H) 70 - 99 mg/dL    Comment: Glucose reference range applies only to samples taken after fasting for at least 8 hours.   Comment 1 Notify RN    Comment 2 Document in Chart   Glucose, capillary     Status: Abnormal   Collection Time: 03/20/22  4:29 AM  Result Value Ref Range   Glucose-Capillary 207 (H) 70 - 99 mg/dL    Comment: Glucose reference range applies only to samples taken after fasting for at least 8 hours.  Basic metabolic panel     Status: Abnormal   Collection Time: 03/20/22  4:45 AM  Result Value Ref Range   Sodium 134 (L) 135 - 145 mmol/L   Potassium 4.1 3.5 - 5.1 mmol/L   Chloride 100 98 - 111 mmol/L   CO2 27 22 - 32 mmol/L   Glucose, Bld 192 (H) 70 - 99 mg/dL    Comment: Glucose reference range applies only to samples taken after fasting for at least 8 hours.   BUN  24 (H) 6 - 20 mg/dL  Creatinine, Ser 1.03 0.61 - 1.24 mg/dL   Calcium 9.0 8.9 - 10.3 mg/dL   GFR, Estimated >60 >60 mL/min    Comment: (NOTE) Calculated using the CKD-EPI Creatinine Equation (2021)    Anion gap 7 5 - 15    Comment: Performed at [facility] [address], [place], [place] [address]    ECG   Atrial sensed, v-paced rhythm - Personally Reviewed  Telemetry   Paced rhythm - Personally Reviewed  Radiology    No results found.  Cardiac Studies   N/A  Assessment   Principal Problem:   Acute on chronic combined systolic and diastolic CHF (congestive heart failure) (HCC) BIV-AICD near ERI  Plan   Approaching euvolemia - will stop additional IV lasix today, transition to home dose oral lasix 80 mg daily tomorrow. Close to d/c, probably tomorrow.  Blood glucose has improved, but will likely need more long-acting insulin.   Time Spent Directly with Patient:  I have spent a total of 25 minutes with the patient reviewing hospital notes, telemetry, EKGs, labs and examining the patient as well as establishing an assessment and plan that was discussed personally with the patient.  > 50% of time was spent in direct patient care.  Length of Stay:  LOS: 4 days   Pixie Casino, MD, [facility], [place] Director of the [facility] Lipid [facility]  Cardiovascular [facility] Clinic Diplomate of the American Board of Clinical Lipidology Attending Cardiologist  Direct Dial: [phone]  Fax: [phone]  Website:  [email or web address] 03/20/2022, 9:53 AM

## 2022-03-20 NOTE — Progress Notes (Signed)
CARDIAC REHAB PHASE I   PRE:  Rate/Rhythm: 85 NS V-paced  BP:  Sitting: 117/68      SaO2: 95 2L  MODE:  Ambulation: 470 ft   POST:  Rate/Rhythm: 97 NS V-paced  BP:  Sitting: 128/97      SaO2: 98 2L  Pt was received from chair and ambulated through hallway with RW and 3L. Pt needed multiple breaks for SOB and muscle fatigue but SaO2 maintained 98-100% throughout ambulation. Pt did report a sharp pain in chest while taking a break but no ST changes were noticed and pain went away after 30 seconds. Pt was returned to room w/o any complaints and all needs met.   Christen Bame  11:46 AM 03/20/2022

## 2022-03-21 DIAGNOSIS — I5043 Acute on chronic combined systolic (congestive) and diastolic (congestive) heart failure: Secondary | ICD-10-CM | POA: Diagnosis not present

## 2022-03-21 DIAGNOSIS — J9611 Chronic respiratory failure with hypoxia: Secondary | ICD-10-CM | POA: Diagnosis not present

## 2022-03-21 LAB — BASIC METABOLIC PANEL
Anion gap: 11 (ref 5–15)
BUN: 22 mg/dL — ABNORMAL HIGH (ref 6–20)
CO2: 24 mmol/L (ref 22–32)
Calcium: 9.2 mg/dL (ref 8.9–10.3)
Chloride: 99 mmol/L (ref 98–111)
Creatinine, Ser: 1.08 mg/dL (ref 0.61–1.24)
GFR, Estimated: >60 mL/min (ref >60)
Glucose, Bld: 188 mg/dL — ABNORMAL HIGH (ref 70–99)
Potassium: 4 mmol/L (ref 3.5–5.1)
Sodium: 134 mmol/L — ABNORMAL LOW (ref 135–145)

## 2022-03-21 LAB — GLUCOSE, CAPILLARY
Glucose-Capillary: 165 mg/dL — ABNORMAL HIGH (ref 70–99)
Glucose-Capillary: 153 mg/dL — ABNORMAL HIGH (ref 70–99)
Glucose-Capillary: 148 mg/dL — ABNORMAL HIGH (ref 70–99)
Glucose-Capillary: 230 mg/dL — ABNORMAL HIGH (ref 70–99)

## 2022-03-21 LAB — MAGNESIUM: Magnesium: 2.2 mg/dL (ref 1.7–2.4)

## 2022-03-21 NOTE — Progress Notes (Signed)
Saw Mr Friesen walking independently  earlier he declined to walk with Cardiac Rehab.Barnet Pall, RN,BSN 03/21/2022 11:46 AM

## 2022-03-21 NOTE — Progress Notes (Signed)
DAILY PROGRESS NOTE   Patient Name: Luis [name] Date of Encounter: 03/21/2022 Cardiologist: Glenetta Hew, MD  Chief Complaint   No complaints  Patient Profile   Luis [name] is a 51 y.o. male  who is being seen 03/16/2022 for the evaluation of CHF.  Subjective   Net negative 2L yesterday on IV Lasix 40 mg x 1, -13 L on admission.  Creatinine stable at 1.08.  BP 115/70.  Reports dyspnea improved   Objective   Vitals:   03/20/22 1611 03/20/22 1934 03/21/22 0300 03/21/22 0332  BP: (!) 145/84 97/83  115/70  Pulse: 84   85  Resp:  18    Temp:  (!) 97.3 F (36.3 C)  97.7 F (36.5 C)  TempSrc:  Oral  Oral  SpO2:  95%  92%  Weight:   135.6 kg   Height:        Intake/Output Summary (Last 24 hours) at 03/21/2022 0834 Last data filed at 03/21/2022 0300 Gross per 24 hour  Intake 360 ml  Output 2125 ml  Net -1765 ml   Filed Weights   03/19/22 0613 03/20/22 0406 03/21/22 0300  Weight: 135.8 kg 135.1 kg 135.6 kg    Physical Exam   GEN:  in no acute distress HEENT: normal Neck: no JVD Cardiac: RRR; no murmurs Respiratory:  clear to auscultation bilaterally, normal work of breathing GI: soft, nontender. Trace edema MS: no deformity or atrophy Skin: warm and dry, no rash Neuro:  Alert and Oriented x 3 Psych: euthymic mood, full affect   Inpatient Medications    Scheduled Meds:  carvedilol  12.5 mg Oral BID WC   clopidogrel  75 mg Oral Daily   dapagliflozin propanediol  10 mg Oral Daily   DULoxetine  90 mg Oral Daily   enoxaparin (LOVENOX) injection  0.5 mg/kg Subcutaneous Q24H   furosemide  80 mg Oral Daily   guaiFENesin-codeine  5 mL Oral QHS   insulin aspart  0-15 Units Subcutaneous TID AC & HS   insulin aspart  7 Units Subcutaneous TID WC   insulin glargine-yfgn  38 Units Subcutaneous QHS   nystatin   Topical BID   pantoprazole  40 mg Oral Daily   prazosin  2 mg Oral QHS   rosuvastatin  20 mg Oral Daily   sacubitril-valsartan  1  tablet Oral BID   sodium chloride flush  3 mL Intravenous Q12H   spironolactone  25 mg Oral Daily   traZODone  200 mg Oral QHS    Continuous Infusions:  sodium chloride      PRN Meds: sodium chloride, acetaminophen, hydrOXYzine, sodium chloride flush   Labs   Results for orders placed or performed during the hospital encounter of 03/16/22 (from the past 48 hour(s))  Glucose, capillary     Status: Abnormal   Collection Time: 03/19/22 10:41 AM  Result Value Ref Range   Glucose-Capillary 281 (H) 70 - 99 mg/dL    Comment: Glucose reference range applies only to samples taken after fasting for at least 8 hours.  Glucose, capillary     Status: Abnormal   Collection Time: 03/19/22  3:55 PM  Result Value Ref Range   Glucose-Capillary 147 (H) 70 - 99 mg/dL    Comment: Glucose reference range applies only to samples taken after fasting for at least 8 hours.  Glucose, capillary     Status: Abnormal   Collection Time: 03/19/22  8:52 PM  Result Value Ref Range  Glucose-Capillary 262 (H) 70 - 99 mg/dL    Comment: Glucose reference range applies only to samples taken after fasting for at least 8 hours.   Comment 1 Notify RN    Comment 2 Document in Chart   Glucose, capillary     Status: Abnormal   Collection Time: 03/20/22  4:29 AM  Result Value Ref Range   Glucose-Capillary 207 (H) 70 - 99 mg/dL    Comment: Glucose reference range applies only to samples taken after fasting for at least 8 hours.  Basic metabolic panel     Status: Abnormal   Collection Time: 03/20/22  4:45 AM  Result Value Ref Range   Sodium 134 (L) 135 - 145 mmol/L   Potassium 4.1 3.5 - 5.1 mmol/L   Chloride 100 98 - 111 mmol/L   CO2 27 22 - 32 mmol/L   Glucose, Bld 192 (H) 70 - 99 mg/dL    Comment: Glucose reference range applies only to samples taken after fasting for at least 8 hours.   BUN 24 (H) 6 - 20 mg/dL   Creatinine, Ser 1.03 0.61 - 1.24 mg/dL   Calcium 9.0 8.9 - 10.3 mg/dL   GFR, Estimated >60 >60  mL/min    Comment: (NOTE) Calculated using the CKD-EPI Creatinine Equation (2021)    Anion gap 7 5 - 15    Comment: Performed at [facility] [address], [place], [place] [address]  Glucose, capillary     Status: Abnormal   Collection Time: 03/20/22 11:57 AM  Result Value Ref Range   Glucose-Capillary 200 (H) 70 - 99 mg/dL    Comment: Glucose reference range applies only to samples taken after fasting for at least 8 hours.  Glucose, capillary     Status: Abnormal   Collection Time: 03/20/22  3:52 PM  Result Value Ref Range   Glucose-Capillary 153 (H) 70 - 99 mg/dL    Comment: Glucose reference range applies only to samples taken after fasting for at least 8 hours.  Glucose, capillary     Status: Abnormal   Collection Time: 03/20/22  8:50 PM  Result Value Ref Range   Glucose-Capillary 227 (H) 70 - 99 mg/dL    Comment: Glucose reference range applies only to samples taken after fasting for at least 8 hours.   Comment 1 Notify RN    Comment 2 Document in Chart   Basic metabolic panel     Status: Abnormal   Collection Time: 03/21/22  3:48 AM  Result Value Ref Range   Sodium 134 (L) 135 - 145 mmol/L   Potassium 4.0 3.5 - 5.1 mmol/L   Chloride 99 98 - 111 mmol/L   CO2 24 22 - 32 mmol/L   Glucose, Bld 188 (H) 70 - 99 mg/dL    Comment: Glucose reference range applies only to samples taken after fasting for at least 8 hours.   BUN 22 (H) 6 - 20 mg/dL   Creatinine, Ser 1.08 0.61 - 1.24 mg/dL   Calcium 9.2 8.9 - 10.3 mg/dL   GFR, Estimated >60 >60 mL/min    Comment: (NOTE) Calculated using the CKD-EPI Creatinine Equation (2021)    Anion gap 11 5 - 15    Comment: Performed at [facility] [address], [place], [place] [address]  Magnesium     Status: None   Collection Time: 03/21/22  3:48 AM  Result Value Ref Range   Magnesium 2.2 1.7 - 2.4 mg/dL  Comment: Performed at [facility], [place] [address], [place], [place] [address]  Glucose, capillary      Status: Abnormal   Collection Time: 03/21/22  6:05 AM  Result Value Ref Range   Glucose-Capillary 165 (H) 70 - 99 mg/dL    Comment: Glucose reference range applies only to samples taken after fasting for at least 8 hours.   Comment 1 Notify RN    Comment 2 Document in Chart     ECG   Atrial sensed, v-paced rhythm - Personally Reviewed  Telemetry   Paced rhythm - Personally Reviewed  Radiology    No results found.  Cardiac Studies   N/A  Assessment   Principal Problem:   Acute on chronic combined systolic and diastolic CHF (congestive heart failure) (HCC) BIV-AICD near ERI  Plan   51 y.o. male with a hx of chronic systolic heart failure, nonischemic cardiomyopathy, s/p biventricular ICD, chronic hypoxic respiratory failure on 2.5L nasal cannula, OSA on CPAP, HTN, hyperlipidemia, type 2 diabetes, substance abuse, morbid obesity  Acute on chronic combined CHF/NICM: LVEF 25% on echo 02/27/22. [place] 9/8 revealed relatively normal filling pressures with mean RA pressure of 8 mmHg, mean wedge pressure 13 mmHg, mean PA pressure 22 mmHg with a mildly reduced Fick cardiac output.  Weight increased 18 pounds in 8 days.  He was seen in clinic on 03/16/2022 and sent to ED for admission for IV diuresis.  Net -13 L on admission -Switch to p.o. Lasix 80 mg daily today -Continue Coreg 12.5 mg twice daily, Farxiga 10 mg daily, Entresto 24-26 mg twice daily, spironolactone 25 mg daily   S/p ICD: Device previously followed by [facility] approaching end of life, will need generator change. Would like to establish with our EP providers.    Chronic respiratory failure with hypoxia: on home 2.5 L  T2DM: Appreciated diabetes coordinator recs.  Semglee increased to 38 units nightly and meal coverage to NovoLog 7 units 3 times daily with meals  Disposition: Discussed possible discharge today, he was not comfortable with this as his daughter is coming to stay with him to help him but will not be here until  tomorrow.  Plan possible discharge tomorrow if remaining euvolemic with transitioning to p.o. Lasix.  Has follow-up appointment on 03/30/2022.  Length of Stay:  LOS: 5 days     Donato Heinz 03/21/2022, 8:34 AM

## 2022-03-22 DIAGNOSIS — R42 Dizziness and giddiness: Secondary | ICD-10-CM

## 2022-03-22 DIAGNOSIS — Z4502 Encounter for adjustment and management of automatic implantable cardiac defibrillator: Secondary | ICD-10-CM | POA: Diagnosis not present

## 2022-03-22 DIAGNOSIS — J9611 Chronic respiratory failure with hypoxia: Secondary | ICD-10-CM | POA: Diagnosis not present

## 2022-03-22 DIAGNOSIS — I5043 Acute on chronic combined systolic (congestive) and diastolic (congestive) heart failure: Secondary | ICD-10-CM | POA: Diagnosis not present

## 2022-03-22 DIAGNOSIS — Z95810 Presence of automatic (implantable) cardiac defibrillator: Secondary | ICD-10-CM

## 2022-03-22 LAB — BASIC METABOLIC PANEL
Anion gap: 6 (ref 5–15)
BUN: 20 mg/dL (ref 6–20)
CO2: 26 mmol/L (ref 22–32)
Calcium: 8.6 mg/dL — ABNORMAL LOW (ref 8.9–10.3)
Chloride: 102 mmol/L (ref 98–111)
Creatinine, Ser: 1.02 mg/dL (ref 0.61–1.24)
GFR, Estimated: >60 mL/min (ref >60)
Glucose, Bld: 211 mg/dL — ABNORMAL HIGH (ref 70–99)
Potassium: 4.4 mmol/L (ref 3.5–5.1)
Sodium: 134 mmol/L — ABNORMAL LOW (ref 135–145)

## 2022-03-22 LAB — GLUCOSE, CAPILLARY
Glucose-Capillary: 208 mg/dL — ABNORMAL HIGH (ref 70–99)
Glucose-Capillary: 204 mg/dL — ABNORMAL HIGH (ref 70–99)
Glucose-Capillary: 116 mg/dL — ABNORMAL HIGH (ref 70–99)
Glucose-Capillary: 191 mg/dL — ABNORMAL HIGH (ref 70–99)

## 2022-03-22 LAB — MAGNESIUM: Magnesium: 2.1 mg/dL (ref 1.7–2.4)

## 2022-03-22 NOTE — Progress Notes (Signed)
DAILY PROGRESS NOTE   Patient Name: Lorain Childes Date of Encounter: 03/22/2022 Cardiologist: Glenetta Hew, MD  Chief Complaint   No complaints  Patient Profile   AQUAN BARNHARD is a 51 y.o. male  who is being seen 03/16/2022 for the evaluation of CHF.  Subjective   Net negative 2.8L yesterday on PO Lasix 40 mg x 1, -15.5 L on admission.  Creatinine stable at 1.02.  BP 138/76.  Reports dyspnea improved.  Had episode of lightheadedness with getting out of bed overnight, fell to his knees, did not hit his head.   Objective   Vitals:   03/21/22 1926 03/22/22 0000 03/22/22 0354 03/22/22 0808  BP: (!) 119/57  126/81 138/76  Pulse: 82 82 84   Resp: 20 20 19    Temp: 98.3 F (36.8 C)  97.6 F (36.4 C)   TempSrc: Oral  Oral   SpO2: 96%  96%   Weight:   (!) 136.3 kg   Height:        Intake/Output Summary (Last 24 hours) at 03/22/2022 0954 Last data filed at 03/22/2022 0930 Gross per 24 hour  Intake 840 ml  Output 3600 ml  Net -2760 ml    Filed Weights   03/20/22 0406 03/21/22 0300 03/22/22 0354  Weight: 135.1 kg 135.6 kg (!) 136.3 kg    Physical Exam   GEN:  in no acute distress HEENT: normal Neck: no JVD Cardiac: RRR; no murmurs Respiratory:  clear to auscultation bilaterally, normal work of breathing GI: soft, nontender. Trace edema MS: no deformity or atrophy Skin: warm and dry, no rash Neuro:  Alert and Oriented x 3 Psych: euthymic mood, full affect   Inpatient Medications    Scheduled Meds:  carvedilol  12.5 mg Oral BID WC   clopidogrel  75 mg Oral Daily   dapagliflozin propanediol  10 mg Oral Daily   DULoxetine  90 mg Oral Daily   enoxaparin (LOVENOX) injection  0.5 mg/kg Subcutaneous Q24H   furosemide  80 mg Oral Daily   guaiFENesin-codeine  5 mL Oral QHS   insulin aspart  0-15 Units Subcutaneous TID AC & HS   insulin aspart  7 Units Subcutaneous TID WC   insulin glargine-yfgn  38 Units Subcutaneous QHS   nystatin   Topical BID    pantoprazole  40 mg Oral Daily   prazosin  2 mg Oral QHS   rosuvastatin  20 mg Oral Daily   sacubitril-valsartan  1 tablet Oral BID   sodium chloride flush  3 mL Intravenous Q12H   spironolactone  25 mg Oral Daily   traZODone  200 mg Oral QHS    Continuous Infusions:  sodium chloride      PRN Meds: sodium chloride, acetaminophen, hydrOXYzine, sodium chloride flush   Labs   Results for orders placed or performed during the hospital encounter of 03/16/22 (from the past 48 hour(s))  Glucose, capillary     Status: Abnormal   Collection Time: 03/20/22 11:57 AM  Result Value Ref Range   Glucose-Capillary 200 (H) 70 - 99 mg/dL    Comment: Glucose reference range applies only to samples taken after fasting for at least 8 hours.  Glucose, capillary     Status: Abnormal   Collection Time: 03/20/22  3:52 PM  Result Value Ref Range   Glucose-Capillary 153 (H) 70 - 99 mg/dL    Comment: Glucose reference range applies only to samples taken after fasting for at least 8 hours.  Glucose,  capillary     Status: Abnormal   Collection Time: 03/20/22  8:50 PM  Result Value Ref Range   Glucose-Capillary 227 (H) 70 - 99 mg/dL    Comment: Glucose reference range applies only to samples taken after fasting for at least 8 hours.   Comment 1 Notify RN    Comment 2 Document in Chart   Basic metabolic panel     Status: Abnormal   Collection Time: 03/21/22  3:48 AM  Result Value Ref Range   Sodium 134 (L) 135 - 145 mmol/L   Potassium 4.0 3.5 - 5.1 mmol/L   Chloride 99 98 - 111 mmol/L   CO2 24 22 - 32 mmol/L   Glucose, Bld 188 (H) 70 - 99 mg/dL    Comment: Glucose reference range applies only to samples taken after fasting for at least 8 hours.   BUN 22 (H) 6 - 20 mg/dL   Creatinine, Ser 1.08 0.61 - 1.24 mg/dL   Calcium 9.2 8.9 - 10.3 mg/dL   GFR, Estimated >60 >60 mL/min    Comment: (NOTE) Calculated using the CKD-EPI Creatinine Equation (2021)    Anion gap 11 5 - 15    Comment: Performed at  [facility] [address] [place], [place] [address]  Magnesium     Status: None   Collection Time: 03/21/22  3:48 AM  Result Value Ref Range   Magnesium 2.2 1.7 - 2.4 mg/dL    Comment: Performed at [facility] [address], [place], [place] [address]  Glucose, capillary     Status: Abnormal   Collection Time: 03/21/22  6:05 AM  Result Value Ref Range   Glucose-Capillary 165 (H) 70 - 99 mg/dL    Comment: Glucose reference range applies only to samples taken after fasting for at least 8 hours.   Comment 1 Notify RN    Comment 2 Document in Chart   Glucose, capillary     Status: Abnormal   Collection Time: 03/21/22 11:36 AM  Result Value Ref Range   Glucose-Capillary 153 (H) 70 - 99 mg/dL    Comment: Glucose reference range applies only to samples taken after fasting for at least 8 hours.  Glucose, capillary     Status: Abnormal   Collection Time: 03/21/22  5:16 PM  Result Value Ref Range   Glucose-Capillary 148 (H) 70 - 99 mg/dL    Comment: Glucose reference range applies only to samples taken after fasting for at least 8 hours.  Glucose, capillary     Status: Abnormal   Collection Time: 03/21/22  9:15 PM  Result Value Ref Range   Glucose-Capillary 230 (H) 70 - 99 mg/dL    Comment: Glucose reference range applies only to samples taken after fasting for at least 8 hours.   Comment 1 Notify RN    Comment 2 Document in Chart   Basic metabolic panel     Status: Abnormal   Collection Time: 03/22/22  6:01 AM  Result Value Ref Range   Sodium 134 (L) 135 - 145 mmol/L   Potassium 4.4 3.5 - 5.1 mmol/L   Chloride 102 98 - 111 mmol/L   CO2 26 22 - 32 mmol/L   Glucose, Bld 211 (H) 70 - 99 mg/dL    Comment: Glucose reference range applies only to samples taken after fasting for at least 8 hours.   BUN 20 6 - 20 mg/dL   Creatinine, Ser 1.02 0.61 - 1.24 mg/dL   Calcium  8.6 (L) 8.9 - 10.3 mg/dL   GFR, Estimated >60 >60 mL/min    Comment: (NOTE) Calculated using the  CKD-EPI Creatinine Equation (2021)    Anion gap 6 5 - 15    Comment: Performed at [facility], [place] [address], [place], [place] [address]  Magnesium     Status: None   Collection Time: 03/22/22  6:01 AM  Result Value Ref Range   Magnesium 2.1 1.7 - 2.4 mg/dL    Comment: Performed at [facility] [address], [place], [place] [address]  Glucose, capillary     Status: Abnormal   Collection Time: 03/22/22  6:19 AM  Result Value Ref Range   Glucose-Capillary 208 (H) 70 - 99 mg/dL    Comment: Glucose reference range applies only to samples taken after fasting for at least 8 hours.   Comment 1 Notify RN    Comment 2 Document in Chart     ECG   Atrial sensed, v-paced rhythm - Personally Reviewed  Telemetry   Paced rhythm - Personally Reviewed  Radiology    No results found.  Cardiac Studies   N/A  Assessment   Principal Problem:   Acute on chronic combined systolic and diastolic CHF (congestive heart failure) (HCC) BIV-AICD near ERI  Plan   51 y.o. male with a hx of chronic systolic heart failure, nonischemic cardiomyopathy, s/p biventricular ICD, chronic hypoxic respiratory failure on 2.5L nasal cannula, OSA on CPAP, HTN, hyperlipidemia, type 2 diabetes, substance abuse, morbid obesity  Acute on chronic combined CHF/NICM: LVEF 25% on echo 02/27/22. [place] 9/8 revealed relatively normal filling pressures with mean RA pressure of 8 mmHg, mean wedge pressure 13 mmHg, mean PA pressure 22 mmHg with a mildly reduced Fick cardiac output.  Weight increased 18 pounds in 8 days.  He was seen in clinic on 03/16/2022 and sent to ED for admission for IV diuresis.  Net -13 L on admission -Switch to p.o. Lasix 80 mg daily today -Continue Coreg 12.5 mg twice daily, Farxiga 10 mg daily, Entresto 24-26 mg twice daily, spironolactone 25 mg daily -Check orthostatics given fall after standing overnight.  We will hold diuresis if orthostatic   S/p ICD: Device previously followed by  [facility] approaching end of life, will need generator change. D/w Dr Caryl Comes and he interrogated device today, recommend generator change this admission.   Chronic respiratory failure with hypoxia: on home 2.5 L  T2DM: Appreciated diabetes coordinator recs.  Semglee increased to 38 units nightly and meal coverage to NovoLog 7 units 3 times daily with meals    Length of Stay:  LOS: 6 days     Donato Heinz 03/22/2022, 9:54 AM

## 2022-03-22 NOTE — Progress Notes (Signed)
Device interrogation  Device at RRT in 8/23 LV output 5v @ 1 w threshold of 1.25@ 1 and 1.5 at 0.8 Device reprogrammed Alert inactivated Discussed with dr CS and will consider device generator replacmeent prior to discharge

## 2022-03-23 ENCOUNTER — Encounter (HOSPITAL_COMMUNITY)
Admission: EM | Disposition: A | Discharge status: Home Health | Payer: Self-pay | Source: Ambulatory Visit | Attending: Interventional Cardiology

## 2022-03-23 DIAGNOSIS — I5023 Acute on chronic systolic (congestive) heart failure: Secondary | ICD-10-CM

## 2022-03-23 DIAGNOSIS — I428 Other cardiomyopathies: Secondary | ICD-10-CM

## 2022-03-23 HISTORY — PX: ICD GENERATOR CHANGEOUT: EP1231

## 2022-03-23 LAB — GLUCOSE, CAPILLARY
Glucose-Capillary: 150 mg/dL — ABNORMAL HIGH (ref 70–99)
Glucose-Capillary: 109 mg/dL — ABNORMAL HIGH (ref 70–99)
Glucose-Capillary: 106 mg/dL — ABNORMAL HIGH (ref 70–99)
Glucose-Capillary: 366 mg/dL — ABNORMAL HIGH (ref 70–99)

## 2022-03-23 LAB — CBC
HCT: 38.7 % — ABNORMAL LOW (ref 39.0–52.0)
Hemoglobin: 12.8 g/dL — ABNORMAL LOW (ref 13.0–17.0)
MCH: 29.2 pg (ref 26.0–34.0)
MCHC: 33.1 g/dL (ref 30.0–36.0)
MCV: 88.2 fL (ref 80.0–100.0)
Platelets: 233 10*3/uL (ref 150–400)
RBC: 4.39 MIL/uL (ref 4.22–5.81)
RDW: 13.2 % (ref 11.5–15.5)
WBC: 8.5 10*3/uL (ref 4.0–10.5)
nRBC: 0 % (ref 0.0–0.2)

## 2022-03-23 LAB — SURGICAL PCR SCREEN
MRSA, PCR: NEGATIVE
Staphylococcus aureus: NEGATIVE

## 2022-03-23 LAB — BASIC METABOLIC PANEL
Anion gap: 7 (ref 5–15)
BUN: 24 mg/dL — ABNORMAL HIGH (ref 6–20)
CO2: 25 mmol/L (ref 22–32)
Calcium: 8.6 mg/dL — ABNORMAL LOW (ref 8.9–10.3)
Chloride: 104 mmol/L (ref 98–111)
Creatinine, Ser: 1.02 mg/dL (ref 0.61–1.24)
GFR, Estimated: >60 mL/min (ref >60)
Glucose, Bld: 156 mg/dL — ABNORMAL HIGH (ref 70–99)
Potassium: 4 mmol/L (ref 3.5–5.1)
Sodium: 136 mmol/L (ref 135–145)

## 2022-03-23 LAB — MAGNESIUM: Magnesium: 2.2 mg/dL (ref 1.7–2.4)

## 2022-03-23 SURGERY — ICD GENERATOR CHANGEOUT

## 2022-03-23 MED ORDER — FENTANYL CITRATE (PF) 100 MCG/2ML IJ SOLN
INTRAMUSCULAR | Status: DC | PRN
  Administered 2022-03-23: 25 ug via INTRAVENOUS

## 2022-03-23 MED ORDER — FENTANYL CITRATE (PF) 100 MCG/2ML IJ SOLN
INTRAMUSCULAR | Status: AC
  Filled 2022-03-23: qty 2

## 2022-03-23 MED ORDER — METHYLPREDNISOLONE SODIUM SUCC 125 MG IJ SOLR
INTRAMUSCULAR | Status: DC | PRN
  Administered 2022-03-23: 125 mg via INTRAVENOUS

## 2022-03-23 MED ORDER — TORSEMIDE 20 MG PO TABS: 40.0000 mg | ORAL_TABLET | Freq: Every day | ORAL | Status: DC

## 2022-03-23 MED ORDER — FAMOTIDINE IN NACL 20-0.9 MG/50ML-% IV SOLN
INTRAVENOUS | Status: AC
  Filled 2022-03-23: qty 50

## 2022-03-23 MED ORDER — VANCOMYCIN HCL 1000 MG IV SOLR
INTRAVENOUS | Status: AC | PRN
  Administered 2022-03-23: 1.5 mg via INTRAVENOUS

## 2022-03-23 MED ORDER — SODIUM CHLORIDE 0.9 % IV SOLN
80.0000 mg | INTRAVENOUS | Status: AC
  Administered 2022-03-23: 80 mg

## 2022-03-23 MED ORDER — CHLORHEXIDINE GLUCONATE 4 % EX LIQD
60.0000 mL | Freq: Once | CUTANEOUS | Status: DC
  Filled 2022-03-23: qty 60

## 2022-03-23 MED ORDER — DIPHENHYDRAMINE HCL 50 MG/ML IJ SOLN
INTRAMUSCULAR | Status: AC
  Filled 2022-03-23: qty 1

## 2022-03-23 MED ORDER — LIDOCAINE HCL (PF) 1 % IJ SOLN
INTRAMUSCULAR | Status: DC | PRN
  Administered 2022-03-23: 60 mL

## 2022-03-23 MED ORDER — CEFAZOLIN SODIUM-DEXTROSE 1-4 GM/50ML-% IV SOLN
1.0000 g | Freq: Four times a day (QID) | INTRAVENOUS | Status: AC
  Administered 2022-03-23 – 2022-03-24 (×3): 1 g via INTRAVENOUS
  Filled 2022-03-23 (×3): qty 50

## 2022-03-23 MED ORDER — SODIUM CHLORIDE 0.9 % IV SOLN
INTRAVENOUS | Status: AC
  Filled 2022-03-23: qty 2

## 2022-03-23 MED ORDER — DIPHENHYDRAMINE HCL 50 MG/ML IJ SOLN
INTRAMUSCULAR | Status: DC | PRN
  Administered 2022-03-23: 50 mg via INTRAVENOUS

## 2022-03-23 MED ORDER — OXYCODONE HCL 5 MG PO TABS
5.0000 mg | ORAL_TABLET | Freq: Once | ORAL | Status: AC
  Administered 2022-03-23: 5 mg via ORAL
  Filled 2022-03-23: qty 1

## 2022-03-23 MED ORDER — MIDAZOLAM HCL 5 MG/5ML IJ SOLN
INTRAMUSCULAR | Status: AC
  Filled 2022-03-23: qty 5

## 2022-03-23 MED ORDER — VANCOMYCIN HCL 1500 MG/300ML IV SOLN
1500.0000 mg | INTRAVENOUS | Status: DC
  Filled 2022-03-23: qty 300

## 2022-03-23 MED ORDER — METHYLPREDNISOLONE SODIUM SUCC 125 MG IJ SOLR
INTRAMUSCULAR | Status: AC
  Filled 2022-03-23: qty 2

## 2022-03-23 MED ORDER — MIDAZOLAM HCL 5 MG/5ML IJ SOLN
INTRAMUSCULAR | Status: DC | PRN
  Administered 2022-03-23: 1 mg via INTRAVENOUS

## 2022-03-23 MED ORDER — SODIUM CHLORIDE 0.9 % IV SOLN: INTRAVENOUS | Status: DC

## 2022-03-23 MED ORDER — TETRACAINE HCL 1 % IJ SOLN
5.0000 mg | Freq: Once | INTRAMUSCULAR | Status: DC
  Filled 2022-03-23: qty 2

## 2022-03-23 MED ORDER — TORSEMIDE 20 MG PO TABS
40.0000 mg | ORAL_TABLET | Freq: Every day | ORAL | Status: DC
  Administered 2022-03-23 – 2022-03-25 (×3): 40 mg via ORAL
  Filled 2022-03-23 (×3): qty 2

## 2022-03-23 MED ORDER — LIDOCAINE HCL (PF) 1 % IJ SOLN
INTRAMUSCULAR | Status: AC
  Filled 2022-03-23: qty 60

## 2022-03-23 SURGICAL SUPPLY — 6 items
CABLE SURGICAL S-101-97-12 (CABLE) ×1 IMPLANT
ICD CLARIA MRI DTMA1D4 (ICD Generator) IMPLANT
PAD DEFIB RADIO PHYSIO CONN (PAD) ×1 IMPLANT
POUCH AIGIS-R ANTIBACT ICD (Mesh General) ×1 IMPLANT
POUCH AIGIS-R ANTIBACT ICD LRG (Mesh General) IMPLANT
TRAY PACEMAKER INSERTION (PACKS) ×1 IMPLANT

## 2022-03-23 NOTE — Consult Note (Addendum)
Cardiology Consultation   Patient ID: Luis [name] MRN: [id number]; DOB: [birth year]  Admit date: 03/16/2022 Date of Consult: 03/23/2022  PCP:  Buzzy Han, MD (Inactive)   [facility] Cardiologist:  Glenetta Hew, MD   {   Patient Profile:   Luis [name] is a 51 y.o. male with a hx of NICM, chronic hypoxic respiratory failure on home O2, chronic CHf (systolic), OSA w/CPAP, HTN, HLD, DM, remote cocaine abuse, morbid obesity, who is being seen 03/23/2022 for the evaluation of ICD at [facility] at the request of Dr. Gardiner Rhyme.  History of Present Illness:   Mr. Siever was admitted for acute/chronic F exacerbation 03/16/22, he has diuresed 16 L and feeling much better  Transitioned to PO lasix over the weekend VSS Cumulative negative -[phone]  Device followed at [facility] though perhaps not in some time Orthostatic dizziness yesterday Device checked yesterday by Dr. Caryl Comes, reached RRT 02/18/22  LABS K+ 4.0 Mag 2.2 BUN/Creat 24/1.02 WBC 8.5 H/H 12/38 Plts 233  He is not having rest SOB, no CP Sleeps at home with a wedge at his baseline  Past Medical History:  Diagnosis Date   Anxiety    Biventricular ICD (implantable cardioverter-defibrillator) in place    Chronic systolic CHF (congestive heart failure) ([place])    Cocaine use    Diabetes mellitus without complication (HCC)    GERD (gastroesophageal reflux disease)    HLD (hyperlipidemia)    Hypertension    Morbid obesity ([place])    NICM (nonischemic cardiomyopathy) ([place])    OSA (obstructive sleep apnea)    PTSD (post-traumatic stress disorder)    on Depakote   Refusal of blood transfusions as patient is Jehovah's Witness    Tobacco abuse     Past Surgical History:  Procedure Laterality Date   BIV ICD INSERTION CRT-D     FOOT FRACTURE SURGERY     RIGHT HEART CATH N/A 03/06/2022   Procedure: RIGHT HEART CATH;  Surgeon: Early Osmond, MD;  Location: [facility];   Service: Cardiovascular;  Laterality: N/A;   ROTATOR CUFF REPAIR     TONSILLECTOMY       Home Medications:  Prior to Admission medications   Medication Sig Start Date End Date Taking? Authorizing Provider  carvedilol (COREG) 12.5 MG tablet Take 1 tablet (12.5 mg total) by mouth 2 (two) times daily with a meal. 03/08/22  Yes Patrecia Pour, MD  clopidogrel (PLAVIX) 75 MG tablet Take 1 tablet (75 mg total) by mouth daily. 08/22/21  Yes Clapacs, Madie Reno, MD  cyclobenzaprine (FLEXERIL) 10 MG tablet Take 10 mg by mouth 3 (three) times daily. 01/20/22  Yes [provider]  dapagliflozin propanediol (FARXIGA) 10 MG TABS tablet Take 1 tablet (10 mg total) by mouth daily. 03/08/22  Yes Patrecia Pour, MD  DULoxetine (CYMBALTA) 30 MG capsule Take 3 capsules (90 mg total) by mouth daily. 09/03/21  Yes Nwoko, Uchenna E, PA  furosemide (LASIX) 80 MG tablet Take 1 tablet (80 mg total) by mouth daily. 08/21/21  Yes Clapacs, Madie Reno, MD  hydrOXYzine (ATARAX) 25 MG tablet Take 1 tablet (25 mg total) by mouth 3 (three) times daily as needed for anxiety. 09/03/21  Yes Nwoko, Uchenna E, PA  Insulin Lispro Prot & Lispro (HUMALOG MIX 75/25 KWIKPEN) (75-25) 100 UNIT/ML Kwikpen Inject 70 Units into the skin 3 (three) times daily.   Yes [provider]  LANTUS SOLOSTAR 100 UNIT/ML Solostar Pen Inject 50 Units into the  skin 2 (two) times daily. 01/12/22  Yes [provider]  neomycin-polymyxin-hydrocortisone (CORTISPORIN) 3.5-10000-1 OTIC suspension Place 4 drops into the left ear 4 (four) times daily. 10 day course. 02/19/22  Yes [provider]  OXYGEN Inhale 3 L into the lungs continuous.   Yes [provider]  pantoprazole (PROTONIX) 40 MG tablet Take 1 tablet (40 mg total) by mouth daily. 08/21/21  Yes Clapacs, Madie Reno, MD  prazosin (MINIPRESS) 2 MG capsule Take 1 capsule (2 mg total) by mouth at bedtime. 09/03/21  Yes Nwoko, Uchenna E, PA  rosuvastatin (CRESTOR) 20 MG tablet Take 1 tablet  (20 mg total) by mouth daily. 08/22/21  Yes Clapacs, Madie Reno, MD  sacubitril-valsartan (ENTRESTO) 24-26 MG Take 1 tablet by mouth 2 (two) times daily. 03/08/22  Yes Patrecia Pour, MD  spironolactone (ALDACTONE) 25 MG tablet Take 1 tablet (25 mg total) by mouth daily. 03/08/22  Yes Patrecia Pour, MD  terbinafine (LAMISIL) 1 % cream Apply topically 2 (two) times daily. Patient taking differently: Apply 1 Application topically 2 (two) times daily. 03/08/22  Yes Patrecia Pour, MD  traZODone (DESYREL) 100 MG tablet Take 2 tablets (200 mg total) by mouth at bedtime. 09/03/21  Yes Nwoko, Uchenna E, PA  TRULICITY 1.5 0000000 SOPN Inject 1.5 mg into the skin once a week. 08/21/21  Yes Clapacs, Madie Reno, MD    Inpatient Medications: Scheduled Meds:  carvedilol  12.5 mg Oral BID WC   clopidogrel  75 mg Oral Daily   dapagliflozin propanediol  10 mg Oral Daily   DULoxetine  90 mg Oral Daily   enoxaparin (LOVENOX) injection  0.5 mg/kg Subcutaneous Q24H   furosemide  80 mg Oral Daily   guaiFENesin-codeine  5 mL Oral QHS   insulin aspart  0-15 Units Subcutaneous TID AC & HS   insulin aspart  7 Units Subcutaneous TID WC   insulin glargine-yfgn  38 Units Subcutaneous QHS   nystatin   Topical BID   pantoprazole  40 mg Oral Daily   prazosin  2 mg Oral QHS   rosuvastatin  20 mg Oral Daily   sacubitril-valsartan  1 tablet Oral BID   sodium chloride flush  3 mL Intravenous Q12H   spironolactone  25 mg Oral Daily   traZODone  200 mg Oral QHS   Continuous Infusions:  sodium chloride     PRN Meds: sodium chloride, acetaminophen, hydrOXYzine, sodium chloride flush  Allergies:    Allergies  Allergen Reactions   Aspirin Anaphylaxis and Other (See Comments)    Throat closing    Iodine-131 Hives   Iodinated Contrast Media Hives   Latex Hives and Rash   Penicillins Nausea And Vomiting   Ultram [Tramadol] Hives and Nausea Only   Augmentin [Amoxicillin-Pot Clavulanate] Diarrhea   Lidocaine Rash   Zestril  [Lisinopril] Cough    Social History:   Social History   Socioeconomic History   Marital status: Widowed    Spouse name: Not on file   Number of children: 7   Years of education: Not on file   Highest education level: Bachelor's degree (e.g., BA, AB, BS)  Occupational History   Occupation: disability  Tobacco Use   Smoking status: Former    Packs/day: 0.20    Types: Cigarettes    Quit date: 10/2021    Years since quitting: 0.4   Smokeless tobacco: Never   Tobacco comments:    occasionally  Vaping Use   Vaping Use: Never used  Substance and Sexual Activity   Alcohol use: Not Currently   Drug use: Yes    Types: Cocaine    Comment: last used july   Sexual activity: Not Currently  Other Topics Concern   Not on file  Social History Narrative   Not on file   Social Determinants of Health   Financial Resource Strain: Low Risk  (03/17/2022)   Overall Financial Resource Strain (CARDIA)    Difficulty of Paying Living Expenses: Not hard at all  Food Insecurity: No Food Insecurity (03/17/2022)   Hunger Vital Sign    Worried About Running Out of Food in the Last Year: Never true    Ran Out of Food in the Last Year: Never true  Transportation Needs: No Transportation Needs (03/17/2022)   PRAPARE - Hydrologist (Medical): No    Lack of Transportation (Non-Medical): No  Physical Activity: Not on file  Stress: Not on file  Social Connections: Not on file  Intimate Partner Violence: Not on file    Family History:   Family History  Problem Relation Age of Onset   Hypertension Mother    Bone cancer Father    Lung cancer Father      ROS:  Please see the history of present illness.  All other ROS reviewed and negative.     Physical Exam/Data:   Vitals:   03/22/22 1037 03/22/22 2107 03/23/22 0148 03/23/22 0345  BP: 121/74 (!) 123/96  (!) 112/57  Pulse:  91  80  Resp:  19  19  Temp:  98 F (36.7 C)  97.9 F (36.6 C)  TempSrc:  Oral  Oral   SpO2:  96%  98%  Weight:   (!) 137 kg   Height:        Intake/Output Summary (Last 24 hours) at 03/23/2022 1001 Last data filed at 03/23/2022 0348 Gross per 24 hour  Intake 960 ml  Output 2010 ml  Net -1050 ml      03/23/2022    1:48 AM 03/22/2022    3:54 AM 03/21/2022    3:00 AM  Last 3 Weights  Weight (lbs) 302 lb 1.6 oz 300 lb 6.4 oz 298 lb 15.1 oz  Weight (kg) 137.032 kg 136.261 kg 135.6 kg     Body mass index is 48.76 kg/m.  General:  Well nourished, well developed, in no acute distress, chronically ill appearing, looks older then his age 37: normal Neck: no JVD Vascular: No carotid bruits; Distal pulses 2+ bilaterally Cardiac:  RRR; no murmurs, gallops or rubs Lungs: CTA b/l, no wheezing, rhonchi or rales  Abd: soft, nontender, obese  Ext: no edema Musculoskeletal:  No deformities Skin: warm and dry  Neuro:  no focal abnormalities noted Psych:  Normal affect   EKG:  The EKG was personally reviewed and demonstrates:   SR/ V paced  Telemetry:  Telemetry was personally reviewed and demonstrates:   SR/V paced  Relevant CV Studies:  02/27/22: TTE  1. Left ventricular ejection fraction, by estimation, is 25%. The left  ventricle has severely decreased function. The left ventricle demonstrates  global hypokinesis. Cannot comment on regional wall motion abnormalities  and Definity contrast was not  attempted. Consider repeat limited echo for RWM assessment if clinically  indicated. The left ventricular internal cavity size was mildly dilated.  Left ventricular diastolic parameters are indeterminate.   2. Right ventricular systolic function is mildly reduced. The right  ventricular size is normal. Tricuspid regurgitation signal  is inadequate  for assessing PA pressure.   3. The mitral valve is grossly normal. Trivial mitral valve  regurgitation. No evidence of mitral stenosis.   4. The aortic valve is grossly normal. There is mild calcification of the  aortic  valve. Aortic valve regurgitation is not visualized. No aortic  stenosis is present.   5. The inferior vena cava is normal in size with greater than 50%  respiratory variability, suggesting right atrial pressure of 3 mmHg.    Right Cardiac Catheterization 03/06/2022: 1.  Relatively normal filling pressures with a mean RA pressure of 8 mmHg, mean wedge pressure of 13 mmHg, mean PA pressure of 22 mmHg, with a mildly reduced Fick cardiac output of 4.28 L/min and index of 1.82 L/min/m   Laboratory Data:  High Sensitivity Troponin:   Recent Labs  Lab 02/26/22 0739 02/27/22 2012 02/27/22 2256 02/28/22 1859 02/28/22 2030  TROPONINIHS 12 19* 20* 14 15     Chemistry Recent Labs  Lab 03/21/22 0348 03/22/22 0601 03/23/22 0549  NA 134* 134* 136  K 4.0 4.4 4.0  CL 99 102 104  CO2 24 26 25   GLUCOSE 188* 211* 156*  BUN 22* 20 24*  CREATININE 1.08 1.02 1.02  CALCIUM 9.2 8.6* 8.6*  MG 2.2 2.1 2.2  GFRNONAA >60 >60 >60  ANIONGAP 11 6 7     No results for input(s): "PROT", "ALBUMIN", "AST", "ALT", "ALKPHOS", "BILITOT" in the last 168 hours. Lipids No results for input(s): "CHOL", "TRIG", "HDL", "LABVLDL", "LDLCALC", "CHOLHDL" in the last 168 hours.  Hematology Recent Labs  Lab 03/16/22 1629 03/23/22 0549  WBC 9.7 8.5  RBC 4.53 4.39  HGB 13.3 12.8*  HCT 40.6 38.7*  MCV 89.6 88.2  MCH 29.4 29.2  MCHC 32.8 33.1  RDW 13.5 13.2  PLT 251 233   Thyroid No results for input(s): "TSH", "FREET4" in the last 168 hours.  BNP Recent Labs  Lab 03/16/22 1629 03/16/22 1849  BNP 103.9* 93.3    DDimer No results for input(s): "DDIMER" in the last 168 hours.   Radiology/Studies:  No results found.   Assessment and Plan:   ICD at RRT Looks like he is shaping up for discharge soon We can plan to proceed today for gen change, pending lab availability NPO for now Dr. Myles Gip has seen the patient, discussed generator change procedure, the patient as agreeable  I have held his plavix,  unclear indication, I can not find any vascular hx, stents etc by chart notes  C/w attending team 2. NICM 3. Acute/chronic CHF 4. DM 5. HTN   Risk Assessment/Risk Scores:     For questions or updates, please contact [place] Please consult [email or web address] for contact info under    Signed, Baldwin Jamaica, PA-C  03/23/2022 10:01 AM

## 2022-03-23 NOTE — Progress Notes (Signed)
Patient informed RT he would place himself on his CPAP machine when he is ready to sleep. Patient knows to notify nurse to call RT if he needs help putting on his mask.

## 2022-03-23 NOTE — Discharge Summary (Incomplete)
Discharge Summary    Patient ID: Luis [name] MRN: [id number]; DOB: 05-15-1971  Admit date: 03/16/2022 Discharge date: 03/24/2022  PCP:  Buzzy Han, MD (Inactive)   [facility] Providers Cardiologist:  Glenetta Hew, MD   Discharge Diagnoses    Principal Problem:   Acute on chronic combined systolic and diastolic CHF (congestive heart failure) ([facility]) Active Problems:   Non-ischemic cardiomyopathy ([place])   Biventricular ICD (implantable cardioverter-defibrillator) in place   HLD (hyperlipidemia)   Hypertension   Chronic respiratory failure with hypoxia ([place]) - 2.5 L/min   Lightheadedness   Fall    Diagnostic Studies/Procedures    ICD Generator Change 03/23/2022: Impressions: 1. Successful generator replacement with a [employer] CRT ICD device  2. Sedation time 28 minutes  3. No early apparent complications.   _____________   History of Present Illness     Luis [name] is a 51 y.o. male with no obvious CAD noted on coronary CTA in 12/2018 (although this was limited to to significant artifact from biventricular pacemaker), non-ischemic cardiomyopathy/ chronic combined CHF with EF of LVEF of 25% on recent Echo on 02/27/2022 s/p biventricular ICD, chronic hypoxic respiratory failure on 2.5L of O2 at home, hypertension, hyperlipidemia, type 2 diabetes mellitus, obstructive sleep apnea on CPAP, substance abuse, and morbid obesity. He was recently admitted from 02/26/2022 to 03/08/2022 for acute on chronic combined CHF. Right heart cath during that admission showed relatively normal filling pressures with a mildly reduced Fick cardiac output. He was diuresed with IV Lasix and then transitioned to PO Lasix at discharge. At hospital follow-up visit on 03/16/2022, he reported feeling lightheaded and weak with a fall 2 days prior. He also reported chest discomfort, worsening shortness of breath, orthopnea, PND, lower extremity edema, and an 18 lb weight gain  from recent discharge. He was sent to the ED via EMS from the office and was admitted for further management of acute on chronic CHF.  Hospital Course     Consultants: Electrophysiology, Diabetes Coordinator, Physical Therapy  Acute on Chronic Combined CHF Non-Ischemic Cardiomyopathy BNP 103.9 on admission but may be falsely low due to morbid obesity. Chest x-ray showed cardiomegaly but no acute findings .Echo on 02/27/2022 during recent admission showed LVEF of 25% with global hypokinesis and mildly reduced RV function. He was diuresed with IV Lasix with good response. He was initially transitioned back to PO Lasix but was then switched to Torsemide with better urine output. Net negative 20.1 *** L this admission. Discharge weight 296 lbs ***. Will discharge patient on Torsemide 76m daily. Home Coreg was decreased to 6.292mtwice daily given persistent dizziness/ lightheadedness. Will continue home Entresto 24-2633mwice daily, Spironolactone 11m46mily, and Farxiga 10mg24mly. [facility] Recommend repeating BMET at follow-up visit.  S/p ICD Device previously followed by [facility]. It is now approaching end of life and he will need a generator change. EP was consulted and recommended generator change this admission. He underwent generator change on 03/23/2022. He tolerated this procedure well without any complications. EP removed Tegaderm on day of discharge but steri strips remain in place. EP provided wound care instructions. He has some mild oozing at procedure site so will hold Plavix for 4 days per EP. Of note, unclear exactly why he is on Plavix. He has been on Plavix for several years. He does of and Aspirin allergy and a history of TIA since being started on Plavix so will restart after 4 day hold. He  can restart this on 03/28/2022.   Chronic Hypoxic Respiratory Failure  Patient is on 2.5 L of O2 24/7 at home. Breathing has improved with diuresis. Continue home O2.    Hypertension BP mostly well controlled. Continue medications for CHF as above.   Hyperlipidemia Continue home Crestor 65m daily.   Type 2 Diabetes Mellitus Hemoglobin A1c 9.1 on 02/26/2022 during recent admission. Diabetes Coordinator was consulted to assist with inpatient management and recommendations followed. Can resume home regimen at discharge. Will refer to Endocrinology at discharge.   Weakness Lightheadedness Falls Patient presented with lightheadedness/weakness and a fall 2 days prior to admission. Discharge was then delayed due to another fall preceded by lightheadedness overnight on 9/23. Orthostatic vital signs on 9/24 were negative. He continued to report some intermittent dizziness when ambulating. Orthostatics were again negative on 9/25 but Coreg was decreased due to persistent symptoms. Plan was to discharge patient on 03/23/2022 but PT recommended additional inpatient PT so he was kept an additional day. *** Home Health PT ordered at discharge.  Patient seen and examined today by Dr. SGardiner Rhymeand determined to be stable for discharge. Outpatient follow-up arranged. Medications as below.  Did the patient have an acute coronary syndrome (MI, NSTEMI, STEMI, etc) this admission?:  No                               Did the patient have a percutaneous coronary intervention (stent / angioplasty)?:  No.    _____________  Discharge Vitals Blood pressure (!) 146/67, pulse 84, temperature 98.3 F (36.8 C), temperature source Oral, resp. rate 18, height '5\' 6"'  (1.676 m), weight 134.7 kg, SpO2 100 %.  Filed Weights   03/22/22 0354 03/23/22 0148 03/24/22 0500  Weight: (!) 136.3 kg (!) 137 kg 134.7 kg   GEN: Morbidly obese male in no acute distress.   Neck: JVD difficult to assess due to body habitus. Cardiac: RRR. No murmurs, rubs, or gallops. Mild oozing on bandage covering ICD site after gen change. Respiratory: No increased work of breathing. Clear to auscultation bilaterally.  No wheezing, rhonchi, or rales appreciated. GI: Soft, non-distended, and non-tender. MS: Trace lower extremity edema bilaterally. No deformity. Skin: Warm and dry. Neuro:  No focal deficits. Psych: Normal affect.  Labs & Radiologic Studies    CBC Recent Labs    03/23/22 0549 03/24/22 0807  WBC 8.5 12.0*  HGB 12.8* 14.9  HCT 38.7* 43.9  MCV 88.2 86.9  PLT 233 2329  Basic Metabolic Panel Recent Labs    03/22/22 0601 03/23/22 0549 03/24/22 0807  NA 134* 136 136  K 4.4 4.0 4.3  CL 102 104 99  CO2 '26 25 26  ' GLUCOSE 211* 156* 274*  BUN 20 24* 25*  CREATININE 1.02 1.02 1.23  CALCIUM 8.6* 8.6* 9.0  MG 2.1 2.2  --    Liver Function Tests No results for input(s): "AST", "ALT", "ALKPHOS", "BILITOT", "PROT", "ALBUMIN" in the last 72 hours. No results for input(s): "LIPASE", "AMYLASE" in the last 72 hours. High Sensitivity Troponin:   Recent Labs  Lab 02/26/22 0739 02/27/22 2012 02/27/22 2256 02/28/22 1859 02/28/22 2030  TROPONINIHS 12 19* 20* 14 15    BNP Invalid input(s): "POCBNP" D-Dimer No results for input(s): "DDIMER" in the last 72 hours. Hemoglobin A1C No results for input(s): "HGBA1C" in the last 72 hours. Fasting Lipid Panel No results for input(s): "CHOL", "HDL", "LDLCALC", "TRIG", "CHOLHDL", "LDLDIRECT" in the last  72 hours. Thyroid Function Tests No results for input(s): "TSH", "T4TOTAL", "T3FREE", "THYROIDAB" in the last 72 hours.  Invalid input(s): "FREET3" _____________  DG Chest 2 View  Result Date: 03/24/2022 CLINICAL DATA:  Cardiac device in situ. EXAM: CHEST - 2 VIEW COMPARISON:  March 16, 2022. FINDINGS: Stable cardiomegaly. Left-sided defibrillator is unchanged in position. Lungs are clear. Bony thorax is unremarkable. IMPRESSION: No active cardiopulmonary disease. Electronically Signed   By: Marijo Conception M.D.   On: 03/24/2022 08:09   DG Chest 2 View  Result Date: 03/16/2022 CLINICAL DATA:  Shortness of breath, history of CHF EXAM:  CHEST - 2 VIEW COMPARISON:  02/26/2022 FINDINGS: Unchanged cardiomegaly. Unchanged appearance of left chest cardiac device and leads. No focal pulmonary opacity. No pleural effusion or pneumothorax. No acute osseous abnormality. IMPRESSION: Cardiomegaly without additional acute cardiopulmonary process. Electronically Signed   By: Merilyn Baba M.D.   On: 03/16/2022 16:54   VAS [place] UPPER EXTREMITY VENOUS DUPLEX  Result Date: 03/09/2022 UPPER VENOUS STUDY  Patient Name:  Luis [name]  Date of Exam:   03/07/2022 Medical Rec #: [id number]              Accession #:    [id number] Date of Birth: May 11, 1971               Patient Gender: M Patient Age:   71 years Exam Location:  [facility] Procedure:      VAS [place] UPPER EXTREMITY VENOUS DUPLEX Referring Phys: CALLIE GOODRICH --------------------------------------------------------------------------------  Indications: Pain, Swelling, and Erythema right neck status post right heart cath through jugular vein. Physician requests to rule out large hematoma at site of concern. Limitations: Pain at site of concern, body habitus. Comparison Study: No prior study Performing Technologist: Sharion Dove RVS  Examination Guidelines: A complete evaluation includes B-mode imaging, spectral Doppler, color Doppler, and power Doppler as needed of all accessible portions of each vessel. Bilateral testing is considered an integral part of a complete examination. Limited examinations for reoccurring indications may be performed as noted.  Right Findings: +----------+------------+---------+-----------+----------+-------+ RIGHT     CompressiblePhasicitySpontaneousPropertiesSummary +----------+------------+---------+-----------+----------+-------+ IJV           Full       Yes       Yes                      +----------+------------+---------+-----------+----------+-------+ Subclavian               Yes       Yes                       +----------+------------+---------+-----------+----------+-------+ Axillary                 Yes       Yes                      +----------+------------+---------+-----------+----------+-------+  Summary:  Right: There is no DVT noted in the IJV, subclavian, or axillary veins. No hematoma noted.  *See table(s) above for measurements and observations.  Diagnosing physician: Monica Martinez MD Electronically signed by Monica Martinez MD on 03/09/2022 at 1:39:26 PM.    Final    CARDIAC CATHETERIZATION  Result Date: 03/06/2022 1.  Relatively normal filling pressures with a mean RA pressure of 8 mmHg, mean wedge pressure of 13 mmHg, mean PA pressure of 22 mmHg, with a mildly reduced Fick cardiac output of 4.28 L/min and index of  1.82 L/min/m   ECHOCARDIOGRAM COMPLETE  Result Date: 02/27/2022    ECHOCARDIOGRAM REPORT   Patient Name:   Luis [name] Date of Exam: 02/27/2022 Medical Rec #:  [id number]             Height:       65.5 in Accession #:    [id number]            Weight:       298.3 lb Date of Birth:  07-18-1970              BSA:          2.358 m Patient Age:    32 years              BP:           141/60 mmHg Patient Gender: M                     HR:           93 bpm. Exam Location:  Inpatient Procedure: 2D Echo                              MODIFIED REPORT: This report was modified by Cherlynn Kaiser MD on 02/27/2022 due to revision.  Indications:     CHF  History:         Patient has prior history of Echocardiogram examinations, most                  recent 01/28/2021. CHF, Pacemaker; Risk Factors:Dyslipidemia,                  Diabetes, Hypertension, Current Smoker and cocaine abuse.  Sonographer:     Harvie Junior Referring Phys:  [id number] ERIC CHEN Diagnosing Phys: Cherlynn Kaiser MD  Sonographer Comments: Technically difficult study due to poor echo windows and patient is obese. Image acquisition challenging due to patient body habitus, Image acquisition challenging due to uncooperative patient and  refused ultrasound contrast. IMPRESSIONS  1. Left ventricular ejection fraction, by estimation, is 25%. The left ventricle has severely decreased function. The left ventricle demonstrates global hypokinesis. Cannot comment on regional wall motion abnormalities and Definity contrast was not attempted. Consider repeat limited echo for RWM assessment if clinically indicated. The left ventricular internal cavity size was mildly dilated. Left ventricular diastolic parameters are indeterminate.  2. Right ventricular systolic function is mildly reduced. The right ventricular size is normal. Tricuspid regurgitation signal is inadequate for assessing PA pressure.  3. The mitral valve is grossly normal. Trivial mitral valve regurgitation. No evidence of mitral stenosis.  4. The aortic valve is grossly normal. There is mild calcification of the aortic valve. Aortic valve regurgitation is not visualized. No aortic stenosis is present.  5. The inferior vena cava is normal in size with greater than 50% respiratory variability, suggesting right atrial pressure of 3 mmHg. FINDINGS  Left Ventricle: Left ventricular ejection fraction, by estimation, is 25%. The left ventricle has severely decreased function. The left ventricle demonstrates global hypokinesis. The left ventricular internal cavity size was mildly dilated. There is no left ventricular hypertrophy. Left ventricular diastolic parameters are indeterminate. Right Ventricle: The right ventricular size is normal. No increase in right ventricular wall thickness. Right ventricular systolic function is mildly reduced. Tricuspid regurgitation signal is inadequate for assessing PA pressure. Left Atrium: Left atrial size was normal in size. Right Atrium: Right atrial size  was normal in size. Pericardium: There is no evidence of pericardial effusion. Mitral Valve: The mitral valve is grossly normal. Trivial mitral valve regurgitation. No evidence of mitral valve stenosis. Tricuspid  Valve: The tricuspid valve is normal in structure. Tricuspid valve regurgitation is not demonstrated. No evidence of tricuspid stenosis. Aortic Valve: The aortic valve is grossly normal. There is mild calcification of the aortic valve. Aortic valve regurgitation is not visualized. No aortic stenosis is present. Aortic valve mean gradient measures 4.0 mmHg. Aortic valve peak gradient measures 7.0 mmHg. Aortic valve area, by VTI measures 2.22 cm. Pulmonic Valve: The pulmonic valve was normal in structure. Pulmonic valve regurgitation is trivial. No evidence of pulmonic stenosis. Aorta: The aortic root is normal in size and structure. Venous: The inferior vena cava is normal in size with greater than 50% respiratory variability, suggesting right atrial pressure of 3 mmHg. IAS/Shunts: The interatrial septum was not well visualized. Additional Comments: A device lead is visualized in the right atrium and right ventricle.  LEFT VENTRICLE PLAX 2D LVIDd:         5.90 cm   Diastology LVIDs:         4.65 cm   LV e' medial:    8.49 cm/s LV PW:         1.20 cm   LV E/e' medial:  10.4 LV IVS:        1.20 cm   LV e' lateral:   5.11 cm/s LVOT diam:     2.10 cm   LV E/e' lateral: 17.2 LV SV:         52 LV SV Index:   22 LVOT Area:     3.46 cm  RIGHT VENTRICLE RV Basal diam:  3.60 cm RV Mid diam:    3.50 cm RV S prime:     17.10 cm/s TAPSE (M-mode): 1.8 cm LEFT ATRIUM             Index        RIGHT ATRIUM           Index LA diam:        4.10 cm 1.74 cm/m   RA Area:     10.40 cm LA Vol (A2C):   66.4 ml 28.16 ml/m  RA Volume:   20.90 ml  8.86 ml/m LA Vol (A4C):   40.9 ml 17.35 ml/m LA Biplane Vol: 52.1 ml 22.10 ml/m  AORTIC VALVE                    PULMONIC VALVE AV Area (Vmax):    2.43 cm     PV Vmax:       1.05 m/s AV Area (Vmean):   2.40 cm     PV Peak grad:  4.4 mmHg AV Area (VTI):     2.22 cm AV Vmax:           132.00 cm/s AV Vmean:          88.200 cm/s AV VTI:            0.232 m AV Peak Grad:      7.0 mmHg AV Mean  Grad:      4.0 mmHg LVOT Vmax:         92.70 cm/s LVOT Vmean:        61.000 cm/s LVOT VTI:          0.149 m LVOT/AV VTI ratio: 0.64  AORTA Ao Root diam: 3.00 cm Ao Asc diam:  3.00 cm MITRAL VALVE MV Area (PHT): 6.43 cm    SHUNTS MV Decel Time: 118 msec    Systemic VTI:  0.15 m MR Peak grad: 18.1 mmHg    Systemic Diam: 2.10 cm MR Vmax:      212.50 cm/s MV E velocity: 88.10 cm/s MV A velocity: 44.10 cm/s MV E/A ratio:  2.00 Cherlynn Kaiser MD Electronically signed by Cherlynn Kaiser MD Signature Date/Time: 02/27/2022/4:45:50 PM    Final (Updated)    DG Chest Port 1 View  Result Date: 02/26/2022 CLINICAL DATA:  Shortness of breath EXAM: PORTABLE CHEST 1 VIEW COMPARISON:  08/10/2021 FINDINGS: Cardiac shadow is enlarged. Defibrillator is again noted. Lungs are well aerated bilaterally. No focal infiltrate or sizable effusion is seen. Mild central vascular congestion is noted. IMPRESSION: Mild vascular congestion without edema. Stable cardiomegaly. Electronically Signed   By: Inez Catalina M.D.   On: 02/26/2022 03:28    Disposition   Patient is being discharged home today in good condition.  Follow-up Plans & Appointments     Follow-up Information     [facility]. Go on 03/30/2022.   Specialty: Cardiology Why: Hospital follow up PLEASE bring a current medication list to appointment FREE valet parking, Entrance C, off [employer] information: [address] [id number] [place] [phone]        Primary Care Provider (Dr. Lin Landsman) Follow up.   Why: Please call and schedule an appointment as soon as possible for a visit.        Almy[facility], [place] Follow up.   Specialties: Cardiology, Radiology Why: Hospital follow-up with General Cardiology scheduled for 04/20/2022 at 8:00am. Please arrive 15 minutes early for check-in. If this date/time does not work for you, pleaes call our office to reschedule. Contact  information: [address] [place] [address] [phone]                Discharge Instructions     Amb Referral to Cardiac Rehabilitation   Complete by: As directed    Diagnosis: Heart Failure (see criteria below if ordering Phase II)   Heart Failure Type: Chronic Systolic   After initial evaluation and assessments completed: Virtual Based Care may be provided alone or in conjunction with Phase 2 Cardiac Rehab based on patient barriers.: Yes   Intensive Cardiac Rehabilitation (ICR) [place] location only OR Traditional Cardiac Rehabilitation (TCR) *If criteria for ICR are not met will enroll in TCR [facility] only): Yes   Ambulatory referral to Endocrinology   Complete by: As directed    Diet - low sodium heart healthy   Complete by: As directed    Increase activity slowly   Complete by: As directed        Discharge Medications   Allergies as of 03/24/2022       Reactions   Aspirin Anaphylaxis, Other (See Comments)   Throat closing   Iodine-131 Hives   Iodinated Contrast Media Hives   Latex Hives, Rash   Penicillins Nausea And Vomiting   Ultram [tramadol] Hives, Nausea Only   Augmentin [amoxicillin-pot Clavulanate] Diarrhea   Lidocaine Rash   Zestril [lisinopril] Cough        Medication List     STOP taking these medications    furosemide 80 MG tablet Commonly known as: LASIX       TAKE these medications    carvedilol 6.25 MG tablet Commonly known as: COREG Take 1 tablet (6.25 mg  total) by mouth 2 (two) times daily with a meal. What changed:  medication strength how much to take   clopidogrel 75 MG tablet Commonly known as: PLAVIX Take 1 tablet (75 mg total) by mouth daily.   cyclobenzaprine 10 MG tablet Commonly known as: FLEXERIL Take 10 mg by mouth 3 (three) times daily.   dapagliflozin propanediol 10 MG Tabs tablet Commonly known as: FARXIGA Take 1 tablet (10 mg total) by mouth daily.   DULoxetine 30 MG capsule Commonly  known as: CYMBALTA Take 3 capsules (90 mg total) by mouth daily.   HumaLOG Mix 75/25 KwikPen (75-25) 100 UNIT/ML Kwikpen Generic drug: Insulin Lispro Prot & Lispro Inject 70 Units into the skin 3 (three) times daily.   hydrOXYzine 25 MG tablet Commonly known as: ATARAX Take 1 tablet (25 mg total) by mouth 3 (three) times daily as needed for anxiety.   Lantus SoloStar 100 UNIT/ML Solostar Pen Generic drug: insulin glargine Inject 50 Units into the skin 2 (two) times daily.   neomycin-polymyxin-hydrocortisone 3.5-10000-1 OTIC suspension Commonly known as: CORTISPORIN Place 4 drops into the left ear 4 (four) times daily. 10 day course.   OXYGEN Inhale 3 L into the lungs continuous.   pantoprazole 40 MG tablet Commonly known as: PROTONIX Take 1 tablet (40 mg total) by mouth daily.   prazosin 2 MG capsule Commonly known as: MINIPRESS Take 1 capsule (2 mg total) by mouth at bedtime.   rosuvastatin 20 MG tablet Commonly known as: CRESTOR Take 1 tablet (20 mg total) by mouth daily.   sacubitril-valsartan 24-26 MG Commonly known as: ENTRESTO Take 1 tablet by mouth 2 (two) times daily.   spironolactone 25 MG tablet Commonly known as: ALDACTONE Take 1 tablet (25 mg total) by mouth daily.   terbinafine 1 % cream Commonly known as: LAMISIL Apply topically 2 (two) times daily. What changed: how much to take   Torsemide 40 MG Tabs Take 40 mg by mouth daily.   traZODone 100 MG tablet Commonly known as: DESYREL Take 2 tablets (200 mg total) by mouth at bedtime.   Trulicity 1.5 XA/7.5SV Sopn Generic drug: Dulaglutide Inject 1.5 mg into the skin once a week.           Outstanding Labs/Studies   Repeat BMET at follow-up visit.  Duration of Discharge Encounter   Greater than 30 minutes including physician time.  Signed, Darreld Mclean, PA-C 03/24/2022, 11:44 AM

## 2022-03-23 NOTE — Progress Notes (Addendum)
Rounding Note    Patient Name: Luis [name] Date of Encounter: 03/23/2022  [place] Cardiologist: Glenetta Hew, MD   Subjective   No acute overnight events. Patient reports significant improvement in his breathing since admission but still describes orthopnea and reports feeling winded with activities such as showering. Edema has improved. He denies any chest pain. He is net negative 16 L this admission but weights have been increasing the last few days. Did confirm with patient that they are not using a bed scale for this. He thinks he needs another dose of IV Lasix. It is difficult to assess volume status on exam due to body habitus. He denies any recurrent dizzy or falling episodes like what he had 2 nights ago. Modified orthostatics (with only sitting and standing) were checked yesterday and were negative).   Inpatient Medications    Scheduled Meds:  carvedilol  12.5 mg Oral BID WC   clopidogrel  75 mg Oral Daily   dapagliflozin propanediol  10 mg Oral Daily   DULoxetine  90 mg Oral Daily   enoxaparin (LOVENOX) injection  0.5 mg/kg Subcutaneous Q24H   furosemide  80 mg Oral Daily   guaiFENesin-codeine  5 mL Oral QHS   insulin aspart  0-15 Units Subcutaneous TID AC & HS   insulin aspart  7 Units Subcutaneous TID WC   insulin glargine-yfgn  38 Units Subcutaneous QHS   nystatin   Topical BID   pantoprazole  40 mg Oral Daily   prazosin  2 mg Oral QHS   rosuvastatin  20 mg Oral Daily   sacubitril-valsartan  1 tablet Oral BID   sodium chloride flush  3 mL Intravenous Q12H   spironolactone  25 mg Oral Daily   traZODone  200 mg Oral QHS   Continuous Infusions:  sodium chloride     PRN Meds: sodium chloride, acetaminophen, hydrOXYzine, sodium chloride flush   Vital Signs    Vitals:   03/22/22 2107 03/23/22 0148 03/23/22 0345 03/23/22 1001  BP: (!) 123/96  (!) 112/57 126/74  Pulse: 91  80 79  Resp: 19  19 18   Temp: 98 F (36.7 C)  97.9 F (36.6 C)    TempSrc: Oral  Oral   SpO2: 96%  98% 100%  Weight:  (!) 137 kg    Height:        Intake/Output Summary (Last 24 hours) at 03/23/2022 1016 Last data filed at 03/23/2022 0348 Gross per 24 hour  Intake 960 ml  Output 2010 ml  Net -1050 ml      03/23/2022    1:48 AM 03/22/2022    3:54 AM 03/21/2022    3:00 AM  Last 3 Weights  Weight (lbs) 302 lb 1.6 oz 300 lb 6.4 oz 298 lb 15.1 oz  Weight (kg) 137.032 kg 136.261 kg 135.6 kg      Telemetry    Ventricular paced rhythm with rates in the 70s to 90s. - Personally Reviewed  ECG    No new ECG tracing today. - Personally Reviewed  Physical Exam   GEN: Morbidly obese male in no acute distress.   Neck: JVD difficult to assess due to body habitus. Cardiac: RRR. No murmurs, rubs, or gallops.  Respiratory: No increased work of breathing. Clear to auscultation bilaterally. No wheezing, rhonchi, or rales appreciated. GI: Soft, non-distended, and non-tender. MS: Trace lower extremity edema bilaterally. No deformity. Skin: Warm and dry. Neuro:  No focal deficits. Psych: Normal affect.  Labs  High Sensitivity Troponin:   Recent Labs  Lab 02/26/22 0739 02/27/22 2012 02/27/22 2256 02/28/22 1859 02/28/22 2030  TROPONINIHS 12 19* 20* 14 15     Chemistry Recent Labs  Lab 03/21/22 0348 03/22/22 0601 03/23/22 0549  NA 134* 134* 136  K 4.0 4.4 4.0  CL 99 102 104  CO2 24 26 25   GLUCOSE 188* 211* 156*  BUN 22* 20 24*  CREATININE 1.08 1.02 1.02  CALCIUM 9.2 8.6* 8.6*  MG 2.2 2.1 2.2  GFRNONAA >60 >60 >60  ANIONGAP 11 6 7     Lipids No results for input(s): "CHOL", "TRIG", "HDL", "LABVLDL", "LDLCALC", "CHOLHDL" in the last 168 hours.  Hematology Recent Labs  Lab 03/16/22 1629 03/23/22 0549  WBC 9.7 8.5  RBC 4.53 4.39  HGB 13.3 12.8*  HCT 40.6 38.7*  MCV 89.6 88.2  MCH 29.4 29.2  MCHC 32.8 33.1  RDW 13.5 13.2  PLT 251 233   Thyroid No results for input(s): "TSH", "FREET4" in the last 168 hours.  BNP Recent Labs   Lab 03/16/22 1629 03/16/22 1849  BNP 103.9* 93.3    DDimer No results for input(s): "DDIMER" in the last 168 hours.   Radiology    No results found.  Cardiac Studies   Echocardiogram 02/27/2022: Impressions:  1. Left ventricular ejection fraction, by estimation, is 25%. The left  ventricle has severely decreased function. The left ventricle demonstrates  global hypokinesis. Cannot comment on regional wall motion abnormalities  and Definity contrast was not  attempted. Consider repeat limited echo for RWM assessment if clinically  indicated. The left ventricular internal cavity size was mildly dilated.  Left ventricular diastolic parameters are indeterminate.   2. Right ventricular systolic function is mildly reduced. The right  ventricular size is normal. Tricuspid regurgitation signal is inadequate  for assessing PA pressure.   3. The mitral valve is grossly normal. Trivial mitral valve  regurgitation. No evidence of mitral stenosis.   4. The aortic valve is grossly normal. There is mild calcification of the  aortic valve. Aortic valve regurgitation is not visualized. No aortic  stenosis is present.   5. The inferior vena cava is normal in size with greater than 50%  respiratory variability, suggesting right atrial pressure of 3 mmHg.  _______________  Right Cardiac Catheterization 03/06/2022: 1.  Relatively normal filling pressures with a mean RA pressure of 8 mmHg, mean wedge pressure of 13 mmHg, mean PA pressure of 22 mmHg, with a mildly reduced Fick cardiac output of 4.28 L/min and index of 1.82 L/min/m  Patient Profile     51 y.o. 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 He was recently admitted from 02/26/2022  to 03/08/2022 for acute on chronic combined CHF. Right heart cath during that admission showed relatively normal filling pressures with a mildly reduced Fick cardiac output. He was readmitted on 03/16/2022 for acute on chronic CHF after presenting with lightheadedness/weakness and a fall as well as an 18 lb weight gain since recent discharge.  Assessment & Plan    Acute on Chronic Combined CHF Non-Ischemic Cardiomyopathy Echo on 02/27/2022 during recent admission showed LVEF of 25% with global hypokinesis and mildly reduced RV function. BNP 103.9 but may be falsely low due to morbid obesity. Chest x-ray showed cardiomegaly but  no acute findings. He was diuresed with IV Lasix and then switched to PO Lasix on 9/24. Documented urinary output of 2.7 L yesterday and net negative 16.8 L this admission. Recorded weights have been increasing the last couple of days. Renal function stable. - Difficult to assess volume status given morbid obesity. He does report significant improvement in his breathing since admission but still describes orthopnea and reports feeling "winded" with activities such as showering. He states he feels like he needs one more dose of IV Lasix. - Switch to torsemide 40 mg daily - Continue Entresto 24-26mg  twice daily. - Continue Coreg 12.5mg  twice daily. - Continue Spironolactone 25mg  daily. - Continue Farxiga 10mg  daily. - Continue to monitor daily weights, strict I/Os, and renal function.  S/p ICD Device previously followed by [facility]. It is now approaching end of life and he will need a generator change. EP was consulted and recommended generator change this admission. Plan is to do this today.  Chronic Hypoxic Respiratory Failure  On 2.5 L of O2 at home. - Breathing has improved with diuresis.  Hypertension BP mostly well controlled. - Continue medications for CHF as above.  Of note, patient did have any episode of lightheadedness and weakness falling to his falls 2 nights  ago. Modified orthostatics (with only sitting and standing) were checked yesterday and were negative). No recurrent falling episodes.  Hyperlipidemia - Continue Crestor 20mg  daily.  Type 2 Diabetes Mellitus Hemoglobin A1c 9.1 on 02/26/2022 during recent admission. - Continue long acting and short acting insulin per Diabetes Coordinator. - Will need follow-up with PCP.   For questions or updates, please contact [place] Please consult [email or web address] for contact info under        Signed, Darreld Mclean, PA-C  03/23/2022, 10:16 AM     Patient seen and examined.  Agree with above documentation.  On exam, patient is alert and oriented, regular rate and rhythm, no murmurs, lungs CTAB, no LE edema, difficult to assess JVD given habitus.  EP planning generator change today.  Switch diuretic to p.o. torsemide 40 mg daily.  Donato Heinz, MD

## 2022-03-23 NOTE — Progress Notes (Signed)
Heart Failure Stewardship Pharmacist Progress Note   PCP: Buzzy Han, MD (Inactive) PCP-Cardiologist: Glenetta Hew, MD    HPI:  51 yo M with PMH of HTN, T2DM, HLD, HFrEF since ~2000, polysubstance use, OSA on CPAP, and PPM.    He presented to the ED on 02/26/22 with chest pain, shortness of breath, and LE edema. He reports he took 320 mg of lasix without any improvement. CXR with mild vascular congestion without pulmonary edema. ECHO on 9/1 showed LVEF 25%, RV mildly reduced. [place] on 9/8 with relatively normal filling pressures and mildly reduced cardiac output (RA 8, PA 22, wedge 13, CO 4.3, CI 1.8). He was discharged on 9/10 with carvedilol, Farxiga, lasix, Entresto and spironolactone.  He had hospital follow up with [facility] on 9/18 and felt lightheadedness and reported falls. Also complains of orthopnea, weight gain of 18 lbs, LE edema, fatigue, chest discomfort, and shortness of breath. He tried additional 80 mg of lasix without any improvement. Sent to ED for evaluation and admitted for IV diuresis. CXR with cardiomegaly. Discharged delayed with orthopnea and dizziness/fall overnight 9/23.   Current HF Medications: Diuretic: torsemide 40 mg PO daily Beta Blocker: carvedilol 12.5 mg BID ACE/ARB/ARNI: Entresto 24/26 mg BID MRA: spironolactone 25 mg daily SGLT2i: Farxiga 10 mg daily  Prior to admission HF Medications: Diuretic: furosemide 80 mg daily Beta blocker: carvedilol 12.5 mg BID ACE/ARB/ARNI: Entresto 24/26 mg BID MRA: spironolactone 25 mg daily SGLT2i: Farxiga 10 mg daily  Pertinent Lab Values: Serum creatinine 1.02, BUN 24, Potassium 4.0, Sodium 136, Magnesium 2.2, BNP 103.9, A1c 9.1   Vital Signs: Weight: 302 lbs (admission weight: 305 lbs) Blood pressure: 120/70s  Heart rate: 70-80s  I/O: -2.8L yesterday; net -16.9L  Medication Assistance / Insurance Benefits Check: Does the patient have prescription insurance?  Yes Type of insurance plan: [place]  Medicaid   Outpatient Pharmacy:  Prior to admission outpatient pharmacy: [facility] Is the patient willing to use [place] pharmacy at discharge? Yes Is the patient willing to transition their outpatient pharmacy to utilize a [facility] outpatient pharmacy?   Pending    Assessment: 1. Acute on chronic systolic CHF (LVEF 93%), due to NICM. NYHA class II symptoms. ICD generator change prior to discharge.  - Agree with transitioning to torsemide 40 mg PO daily. Strict I/O and daily weights. Keep K>4 and Mag>2. - Continue carvedilol 12.5 mg BID - Continue Entresto 24/26 mg BID - Continue spironolactone 25 mg daily - Continue Farxiga 10 mg daily. Reports improvement in symptoms with nystatin.    Plan: 1) Medication changes recommended at this time: - Agree with changes   2) Patient assistance: - Has [place] Medicaid  3)  Education  - Patient has been educated on current HF medications and potential additions to HF medication regimen - Patient verbalizes understanding that over the next few months, these medication doses may change and more medications may be added to optimize HF regimen - Patient has been educated on basic disease state pathophysiology and goals of therapy   Kerby Nora, PharmD, BCPS Heart Failure Stewardship Pharmacist Phone [phone]

## 2022-03-23 NOTE — Progress Notes (Signed)
Attempted to ambulate pt but pt not in room. Will f/u tmrw.   Christen Bame 03/23/2022 2:18 PM

## 2022-03-24 ENCOUNTER — Encounter (HOSPITAL_COMMUNITY): Payer: Self-pay | Admitting: Cardiovascular Disease

## 2022-03-24 ENCOUNTER — Other Ambulatory Visit (HOSPITAL_COMMUNITY): Payer: Self-pay

## 2022-03-24 ENCOUNTER — Inpatient Hospital Stay (HOSPITAL_COMMUNITY): Payer: Medicare Other

## 2022-03-24 DIAGNOSIS — W19XXXA Unspecified fall, initial encounter: Secondary | ICD-10-CM

## 2022-03-24 LAB — CBC
HCT: 43.9 % (ref 39.0–52.0)
Hemoglobin: 14.9 g/dL (ref 13.0–17.0)
MCH: 29.5 pg (ref 26.0–34.0)
MCHC: 33.9 g/dL (ref 30.0–36.0)
MCV: 86.9 fL (ref 80.0–100.0)
Platelets: 260 10*3/uL (ref 150–400)
RBC: 5.05 MIL/uL (ref 4.22–5.81)
RDW: 13.2 % (ref 11.5–15.5)
WBC: 12 10*3/uL — ABNORMAL HIGH (ref 4.0–10.5)
nRBC: 0 % (ref 0.0–0.2)

## 2022-03-24 LAB — GLUCOSE, CAPILLARY
Glucose-Capillary: 241 mg/dL — ABNORMAL HIGH (ref 70–99)
Glucose-Capillary: 247 mg/dL — ABNORMAL HIGH (ref 70–99)
Glucose-Capillary: 169 mg/dL — ABNORMAL HIGH (ref 70–99)
Glucose-Capillary: 247 mg/dL — ABNORMAL HIGH (ref 70–99)

## 2022-03-24 LAB — BASIC METABOLIC PANEL
Anion gap: 11 (ref 5–15)
BUN: 25 mg/dL — ABNORMAL HIGH (ref 6–20)
CO2: 26 mmol/L (ref 22–32)
Calcium: 9 mg/dL (ref 8.9–10.3)
Chloride: 99 mmol/L (ref 98–111)
Creatinine, Ser: 1.23 mg/dL (ref 0.61–1.24)
GFR, Estimated: >60 mL/min (ref >60)
Glucose, Bld: 274 mg/dL — ABNORMAL HIGH (ref 70–99)
Potassium: 4.3 mmol/L (ref 3.5–5.1)
Sodium: 136 mmol/L (ref 135–145)

## 2022-03-24 MED ORDER — OXYCODONE HCL 5 MG PO TABS
5.0000 mg | ORAL_TABLET | Freq: Once | ORAL | Status: AC
  Administered 2022-03-24: 5 mg via ORAL
  Filled 2022-03-24: qty 1

## 2022-03-24 MED ORDER — CARVEDILOL 6.25 MG PO TABS
6.2500 mg | ORAL_TABLET | Freq: Two times a day (BID) | ORAL | 2 refills | Status: DC
  Filled 2022-03-24: qty 60, 30d supply, fill #0

## 2022-03-24 MED ORDER — CARVEDILOL 6.25 MG PO TABS
6.2500 mg | ORAL_TABLET | Freq: Two times a day (BID) | ORAL | Status: DC
  Administered 2022-03-24 – 2022-03-25 (×2): 6.25 mg via ORAL
  Filled 2022-03-24 (×2): qty 1

## 2022-03-24 MED ORDER — TORSEMIDE 20 MG PO TABS
40.0000 mg | ORAL_TABLET | Freq: Every day | ORAL | 2 refills | Status: DC
  Filled 2022-03-24: qty 60, 30d supply, fill #0

## 2022-03-24 NOTE — Progress Notes (Signed)
Heart Failure Stewardship Pharmacist Progress Note   PCP: Buzzy Han, MD (Inactive) PCP-Cardiologist: Glenetta Hew, MD    HPI:  51 yo M with PMH of HTN, T2DM, HLD, HFrEF since ~2000, polysubstance use, OSA on CPAP, and PPM.    He presented to the ED on 02/26/22 with chest pain, shortness of breath, and LE edema. He reports he took 320 mg of lasix without any improvement. CXR with mild vascular congestion without pulmonary edema. ECHO on 9/1 showed LVEF 25%, RV mildly reduced. [place] on 9/8 with relatively normal filling pressures and mildly reduced cardiac output (RA 8, PA 22, wedge 13, CO 4.3, CI 1.8). He was discharged on 9/10 with carvedilol, Farxiga, lasix, Entresto and spironolactone.  He had hospital follow up with [facility] on 9/18 and felt lightheadedness and reported falls. Also complains of orthopnea, weight gain of 18 lbs, LE edema, fatigue, chest discomfort, and shortness of breath. He tried additional 80 mg of lasix without any improvement. Sent to ED for evaluation and admitted for IV diuresis. CXR with cardiomegaly. Discharged delayed with orthopnea and dizziness/fall overnight 9/23. ICD generator changeout on 9/25.  Current HF Medications: Diuretic: torsemide 40 mg PO daily Beta Blocker: carvedilol 12.5 mg BID ACE/ARB/ARNI: Entresto 24/26 mg BID MRA: spironolactone 25 mg daily SGLT2i: Farxiga 10 mg daily  Prior to admission HF Medications: Diuretic: furosemide 80 mg daily Beta blocker: carvedilol 12.5 mg BID ACE/ARB/ARNI: Entresto 24/26 mg BID MRA: spironolactone 25 mg daily SGLT2i: Farxiga 10 mg daily  Pertinent Lab Values: Serum creatinine 1.23, BUN 25, Potassium 4.3, Sodium 136, Magnesium 2.2, BNP 103.9, A1c 9.1   Vital Signs: Weight: 296 lbs (admission weight: 305 lbs) Blood pressure: 120/70s  Heart rate: 80s  I/O: -4.3L yesterday; net -21.1L  Medication Assistance / Insurance Benefits Check: Does the patient have prescription insurance?  Yes Type of  insurance plan: [place] Medicaid   Outpatient Pharmacy:  Prior to admission [facility] Is the patient willing to use [place] pharmacy at discharge? Yes Is the patient willing to transition their outpatient pharmacy to utilize a [facility] outpatient pharmacy?   Pending    Assessment: 1. Acute on chronic systolic CHF (LVEF 86%), due to NICM. NYHA class II symptoms.   - Continue torsemide 40 mg PO daily. Strict I/O and daily weights. Keep K>4 and Mag>2. - Continue carvedilol 12.5 mg BID - Continue Entresto 24/26 mg BID - Continue spironolactone 25 mg daily - Continue Farxiga 10 mg daily. Reports improvement in symptoms with nystatin.    Plan: 1) Medication changes recommended at this time: - Continue current regimen  2) Patient assistance: - Has [place] Medicaid  3)  Education  - Patient has been educated on current HF medications and potential additions to HF medication regimen - Patient verbalizes understanding that over the next few months, these medication doses may change and more medications may be added to optimize HF regimen - Patient has been educated on basic disease state pathophysiology and goals of therapy   Kerby Nora, PharmD, BCPS Heart Failure Stewardship Pharmacist Phone [phone]

## 2022-03-24 NOTE — Care Management Important Message (Signed)
Important Message  Patient Details  Name: Luis [name] MRN: [id number] Date of Birth: [birth year]   Medicare Important Message Given:  Yes     Shelda Altes 03/24/2022, 12:32 PM

## 2022-03-24 NOTE — Progress Notes (Addendum)
Rounding Note    Patient Name: Luis [name] Date of Encounter: 03/24/2022  [place] Cardiologist: Glenetta Hew, MD   Subjective   No significant overnight events. Patient reports he was still having some dizziness this morning while walking as well as pain around ICD site from gen change yesterday. Otherwise, doing okay. He feels like the Torsemide is working much better than the Lasix.  Inpatient Medications    Scheduled Meds:  carvedilol  12.5 mg Oral BID WC   dapagliflozin propanediol  10 mg Oral Daily   DULoxetine  90 mg Oral Daily   guaiFENesin-codeine  5 mL Oral QHS   insulin aspart  0-15 Units Subcutaneous TID AC & HS   insulin aspart  7 Units Subcutaneous TID WC   insulin glargine-yfgn  38 Units Subcutaneous QHS   nystatin   Topical BID   pantoprazole  40 mg Oral Daily   prazosin  2 mg Oral QHS   rosuvastatin  20 mg Oral Daily   sacubitril-valsartan  1 tablet Oral BID   sodium chloride flush  3 mL Intravenous Q12H   spironolactone  25 mg Oral Daily   tetracaine  5-20 mg Other Once   torsemide  40 mg Oral Daily   traZODone  200 mg Oral QHS   Continuous Infusions:  sodium chloride     PRN Meds: sodium chloride, acetaminophen, hydrOXYzine, sodium chloride flush   Vital Signs    Vitals:   03/24/22 0515 03/24/22 0751 03/24/22 1102 03/24/22 1125  BP: 126/79 126/74 110/60 (!) 146/67  Pulse: 90 84    Resp: 20 18    Temp: 98.3 F (36.8 C) 98.3 F (36.8 C)    TempSrc: Oral Oral    SpO2: 99% 100%    Weight:      Height:        Intake/Output Summary (Last 24 hours) at 03/24/2022 1446 Last data filed at 03/24/2022 1300 Gross per 24 hour  Intake 2621.19 ml  Output 5200 ml  Net -2578.81 ml      03/24/2022    5:00 AM 03/23/2022    1:48 AM 03/22/2022    3:54 AM  Last 3 Weights  Weight (lbs) 296 lb 14.4 oz 302 lb 1.6 oz 300 lb 6.4 oz  Weight (kg) 134.673 kg 137.032 kg 136.261 kg      Telemetry    Ventricular pacing with rates in  the 80s. - Personally Reviewed  ECG    No new ECG tracing today. - Personally Reviewed  Physical Exam   GEN: Morbidly obese male in no acute distress.   Neck: JVD difficult to assess due to body habitus. Cardiac: RRR. No murmurs, rubs, or gallops. Mild oozing on bandage covering ICD site after gen change. Respiratory: No increased work of breathing. Clear to auscultation bilaterally. No wheezing, rhonchi, or rales appreciated. GI: Soft, non-distended, and non-tender. MS: Trace lower extremity edema bilaterally. No deformity. Skin: Warm and dry. Neuro:  No focal deficits. Psych: Normal affect.  Labs    High Sensitivity Troponin:   Recent Labs  Lab 02/26/22 0739 02/27/22 2012 02/27/22 2256 02/28/22 1859 02/28/22 2030  TROPONINIHS 12 19* 20* 14 15     Chemistry Recent Labs  Lab 03/21/22 0348 03/22/22 0601 03/23/22 0549 03/24/22 0807  NA 134* 134* 136 136  K 4.0 4.4 4.0 4.3  CL 99 102 104 99  CO2 24 26 25 26   GLUCOSE 188* 211* 156* 274*  BUN 22* 20 24* 25*  CREATININE 1.08 1.02 1.02 1.23  CALCIUM 9.2 8.6* 8.6* 9.0  MG 2.2 2.1 2.2  --   GFRNONAA >60 >60 >60 >60  ANIONGAP 11 6 7 11     Lipids No results for input(s): "CHOL", "TRIG", "HDL", "LABVLDL", "LDLCALC", "CHOLHDL" in the last 168 hours.  Hematology Recent Labs  Lab 03/23/22 0549 03/24/22 0807  WBC 8.5 12.0*  RBC 4.39 5.05  HGB 12.8* 14.9  HCT 38.7* 43.9  MCV 88.2 86.9  MCH 29.2 29.5  MCHC 33.1 33.9  RDW 13.2 13.2  PLT 233 260   Thyroid No results for input(s): "TSH", "FREET4" in the last 168 hours.  BNPNo results for input(s): "BNP", "PROBNP" in the last 168 hours.  DDimer No results for input(s): "DDIMER" in the last 168 hours.   Radiology    DG Chest 2 View  Result Date: 03/24/2022 CLINICAL DATA:  Cardiac device in situ. EXAM: CHEST - 2 VIEW COMPARISON:  March 16, 2022. FINDINGS: Stable cardiomegaly. Left-sided defibrillator is unchanged in position. Lungs are clear. Bony thorax is  unremarkable. IMPRESSION: No active cardiopulmonary disease. Electronically Signed   By: Marijo Conception M.D.   On: 03/24/2022 08:09    Cardiac Studies   Echocardiogram 02/27/2022: Impressions:  1. Left ventricular ejection fraction, by estimation, is 25%. The left  ventricle has severely decreased function. The left ventricle demonstrates  global hypokinesis. Cannot comment on regional wall motion abnormalities  and Definity contrast was not  attempted. Consider repeat limited echo for RWM assessment if clinically  indicated. The left ventricular internal cavity size was mildly dilated.  Left ventricular diastolic parameters are indeterminate.   2. Right ventricular systolic function is mildly reduced. The right  ventricular size is normal. Tricuspid regurgitation signal is inadequate  for assessing PA pressure.   3. The mitral valve is grossly normal. Trivial mitral valve  regurgitation. No evidence of mitral stenosis.   4. The aortic valve is grossly normal. There is mild calcification of the  aortic valve. Aortic valve regurgitation is not visualized. No aortic  stenosis is present.   5. The inferior vena cava is normal in size with greater than 50%  respiratory variability, suggesting right atrial pressure of 3 mmHg.  _______________   Right Cardiac Catheterization 03/06/2022: 1.  Relatively normal filling pressures with a mean RA pressure of 8 mmHg, mean wedge pressure of 13 mmHg, mean PA pressure of 22 mmHg, with a mildly reduced Fick cardiac output of 4.28 L/min and index of 1.82 L/min/m _______________  ICD Generator Change 03/23/2022: Impressions: 1. Successful generator replacement with a Medtronic CRT ICD device  2. Sedation time 28 minutes  3. No early apparent complications.   Patient Profile     51 y.o. 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 He was recently admitted from 02/26/2022 to 03/08/2022 for acute on chronic combined CHF. Right heart cath during that admission showed relatively normal filling pressures with a mildly reduced Fick cardiac output. He was readmitted on 03/16/2022 for acute on chronic CHF after presenting with lightheadedness/weakness and a fall as well as an 18 lb weight gain since recent discharge.  Assessment & Plan    Acute on Chronic Combined CHF Non-Ischemic Cardiomyopathy Echo on 02/27/2022 during recent admission showed LVEF of 25% with global  hypokinesis and mildly reduced RV function. BNP 103.9 but may be falsely low due to morbid obesity. Chest x-ray showed cardiomegaly but no acute findings. He was diuresed with IV Lasix and then switched to PO Lasix on 9/24. He was then switched to Torsemide on 03/23/2022 with better urinary output. Documented urinary output of 5.6 L yesterday and net negative 20.7 L this admission. Weight started to come back down on the Torsemide and is 296 lbs today. . Renal function stable. - Difficult to assess volume status given morbid obesity. Seems to be doing better on Torsemide. - Continue torsemide 40 mg daily - Continue Entresto 24-26mg  twice daily. - Will decrease Coreg to 6.25mg  twice daily given persistent dizziness with ambulating. - Continue Spironolactone 25mg  daily. - Continue Farxiga 10mg  daily. - Continue to monitor daily weights, strict I/Os, and renal function.  S/p ICD Device previously followed by [facility]. It is now approaching end of life and he will need a generator change. S/p gen change on 03/23/2022. - He is having some mild oozing and pain at ICD site. - Will hold Plavix for 4 days per EP. - Continue OTC Tylenol for pain.   Chronic Hypoxic Respiratory Failure  On 2.5 L of O2 at home. - Breathing has improved with  diuresis.   Hypertension BP mostly well controlled. - Continue medications for CHF as above.   Hyperlipidemia - Continue Crestor 20mg  daily.   Type 2 Diabetes Mellitus Hemoglobin A1c 9.1 on 02/26/2022 during recent admission. - Continue long acting and short acting insulin per Diabetes Coordinator. - Will need follow-up with PCP.  Weakness Lightheadedness Falls Patient presented with lightheadedness/weakness and a fall 2 days prior to admission. Discharge was then delayed due to another fall preceded by lightheadedness overnight on 9/23. Orthostatic vital signs on 9/24 were negative. -  He report some dizziness when ambulating this morning. - Orthostatics negative but did decrease Coreg given persistent symptoms. - Initially were hoping to discharge patient today; however, PT had concerns about patient going home tonight and recommended one more session in acute setting tomorrow before discharge home. Therefore, will keep patient another night. - Will order home health PT.  For questions or updates, please contact [place] Please consult [email or web address] for contact info under        Signed, Darreld Mclean, PA-C  03/24/2022, 2:46 PM     Patient seen and examined.  Agree with above documentation.  On exam, patient is alert and oriented, regular rate and rhythm, no murmurs, lungs CTAB, no LE edema.  Excellent diuresis on PO torsemide 40 mg daily. PT recommending working with patient again tomorrow.  Plan discharge tomorrow.  Donato Heinz, MD

## 2022-03-24 NOTE — Progress Notes (Signed)
   03/24/22 1102  Vitals  BP 110/60  MAP (mmHg) 75  Patient Position (if appropriate) Orthostatic Vitals  ECG Heart Rate 82  MEWS COLOR  MEWS Score Color Green  Orthostatic Lying   BP- Lying 104/61  Pulse- Lying 83  Orthostatic Sitting  BP- Sitting 110/60  Pulse- Sitting 82  Orthostatic Standing at 0 minutes  BP- Standing at 0 minutes 120/74  Pulse- Standing at 0 minutes 91  Orthostatic Standing at 3 minutes  BP- Standing at 3 minutes 104/77  Pulse- Standing at 3 minutes 92  MEWS Score  MEWS Temp 0  MEWS Systolic 0  MEWS Pulse 0  MEWS RR 0  MEWS LOC 0  MEWS Score 0    Orthostatics per order

## 2022-03-24 NOTE — Evaluation (Signed)
Physical Therapy Evaluation Patient Details Name: Luis [name] MRN: [id number] DOB: [birth year] Today's Date: 03/24/2022  History of Present Illness  Pt is a 51 y/o male admitted secondary to SOB. Thought to be secondary to CHF. PMH includes pacemaker, DM, tobacco use, HTN and substance abuse.  Clinical Impression  Pt admitted with above diagnosis. Pt known to me from last admission. Pt reports decreased mobility between that admission and this one due to weakness and SOB. Pt able to perform bed mobility and transfers independently but becomes dizzy with standing activity. See BP below. Pt tolerated 2 bouts of ambulation for 60' before needing to sit due to dizziness. Could not tolerate further ambulation after the combined 120'. Have concerns about him going home alone as he will not have supervision until the weekend when his fiancee comes up from [facility]. Would benefit from another session in acute setting before returning home. Rec HHPT upon d/c.  Pt currently with functional limitations due to the deficits listed below (see PT Problem List). Pt will benefit from skilled PT to increase their independence and safety with mobility to allow discharge to the venue listed below.     BP supine 104/61        Sitting 110/60        Standing 1 min 120/74        Standing 3 mins 104/77        Seated after ambulation 146/67     Recommendations for follow up therapy are one component of a multi-disciplinary discharge planning process, led by the attending physician.  Recommendations may be updated based on patient status, additional functional criteria and insurance authorization.  Follow Up Recommendations Home health PT      Assistance Recommended at Discharge Intermittent Supervision/Assistance  Patient can return home with the following  Assistance with cooking/housework;Assist for transportation    Equipment Recommendations Rollator (4 wheels)  Recommendations for Other Services        Functional Status Assessment Patient has had a recent decline in their functional status and demonstrates the ability to make significant improvements in function in a reasonable and predictable amount of time.     Precautions / Restrictions Precautions Precautions: Fall Precaution Comments: monitor O2 and BP Restrictions Weight Bearing Restrictions: No      Mobility  Bed Mobility               General bed mobility comments: pt received OOB in recliner    Transfers Overall transfer level: Needs assistance Equipment used: Rollator (4 wheels) Transfers: Sit to/from Stand Sit to Stand: Supervision           General transfer comment: supervision with transfers, so physical assist needed. Becomes light headed with 2 mins of standing. Orthostatic vitals documented in flowsheets. Pt had 16 mmHg drop in SBP from min 1-3 in standing    Ambulation/Gait Ambulation/Gait assistance: Min guard Gait Distance (Feet): 120 Feet (60', 60') Assistive device: Rollator (4 wheels) Gait Pattern/deviations: Step-through pattern Gait velocity: reduced Gait velocity interpretation: 1.31 - 2.62 ft/sec, indicative of limited community ambulator   General Gait Details: pt ambulated on 3L O2 with SPO2 90's, increased dizziness with need to sit at 60' and worse after second bout of ambulation. Had pt use LE's to walk self back in seated position on rollator due to dizziness  Stairs            Wheelchair Mobility    Modified Rankin (Stroke Patients Only)       Balance  Overall balance assessment: Needs assistance Sitting-balance support: No upper extremity supported, Feet supported Sitting balance-Leahy Scale: Good     Standing balance support: No upper extremity supported, During functional activity Standing balance-Leahy Scale: Fair Standing balance comment: needed min A when dizzy, had 2 minor LOB with self correction                             Pertinent  Vitals/Pain Pain Assessment Pain Assessment: No/denies pain    Home Living Family/patient expects to be discharged to:: Private residence Living Arrangements: Alone Available Help at Discharge: Friend(s);Available PRN/intermittently (fiancee) Type of Home: Apartment Home Access: Level entry       Home Layout: One level Home Equipment: Adaptive equipment;Rolling Walker (2 wheels) Additional Comments: girlfriend lives in [place] and comes on weekends. He reports they are getting married next month and she will move here.    Prior Function Prior Level of Function : Needs assist             Mobility Comments: was home only a week before returning to ED with fluid retention and difficulty breathing. Minimal activity when home per his report ADLs Comments: Reports unable to bathe, dress self, orders food delivery (says he leaves the door unlocked so they bring it in to him). Girlfriend cooks, cleans, helps put pt socks on.     Hand Dominance   Dominant Hand: Right    Extremity/Trunk Assessment   Upper Extremity Assessment Upper Extremity Assessment: Generalized weakness    Lower Extremity Assessment Lower Extremity Assessment: Generalized weakness;RLE deficits/detail RLE Deficits / Details: reporting numbness in R foot and toes, better when wearing shoes RLE Sensation: history of peripheral neuropathy RLE Coordination: decreased gross motor    Cervical / Trunk Assessment Cervical / Trunk Assessment: Other exceptions Cervical / Trunk Exceptions: increased body habitus  Communication   Communication: No difficulties  Cognition Arousal/Alertness: Awake/alert Behavior During Therapy: WFL for tasks assessed/performed, Anxious Overall Cognitive Status: Within Functional Limits for tasks assessed                                          General Comments      Exercises General Exercises - Lower Extremity Long Arc Quad: AROM, Right, 10 reps, Seated,  Limitations (with manual resistance) Long Arc Quad Limitations: pt cannot maintain sustained quad contraction, has intermittent bursts of "letting go" Hip Flexion/Marching: AROM, Right, 5 reps, Seated (with manual resistance)   Assessment/Plan    PT Assessment Patient needs continued PT services  PT Problem List Decreased strength;Decreased activity tolerance;Decreased balance;Decreased mobility;Decreased knowledge of use of DME;Decreased knowledge of precautions       PT Treatment Interventions DME instruction;Gait training;Stair training;Therapeutic activities;Functional mobility training;Balance training;Therapeutic exercise;Patient/family education    PT Goals (Current goals can be found in the Care Plan section)  Acute Rehab PT Goals Patient Stated Goal: to return to independence PT Goal Formulation: With patient Time For Goal Achievement: 04/07/22 Potential to Achieve Goals: Fair    Frequency Min 3X/week     Co-evaluation               AM-PAC PT "6 Clicks" Mobility  Outcome Measure Help needed turning from your back to your side while in a flat bed without using bedrails?: None Help needed moving from lying on your back to sitting on the side of  a flat bed without using bedrails?: None Help needed moving to and from a bed to a chair (including a wheelchair)?: None Help needed standing up from a chair using your arms (e.g., wheelchair or bedside chair)?: A Little Help needed to walk in hospital room?: A Little Help needed climbing 3-5 steps with a railing? : A Lot 6 Click Score: 20    End of Session Equipment Utilized During Treatment: Oxygen Activity Tolerance: Patient tolerated treatment well Patient left: in chair;with call bell/phone within reach Nurse Communication: Mobility status PT Visit Diagnosis: Unsteadiness on feet (R26.81);Muscle weakness (generalized) (M62.81);Difficulty in walking, not elsewhere classified (R26.2)    Time: [id number] PT Time  Calculation (min) (ACUTE ONLY): 33 min   Charges:   PT Evaluation $PT Eval Moderate Complexity: 1 Mod PT Treatments $Gait Training: 8-22 mins        Leighton Roach, PT  Acute Rehab Services Secure chat preferred Office [place] 03/24/2022, 1:49 PM

## 2022-03-24 NOTE — Progress Notes (Signed)
CARDIAC REHAB PHASE I   Pt declined ambulation due to getting d/c soon. Pt reported that he is "upset" that he is getting d/c while still having dizziness. Pt would like to stay until his dizziness gets resolved.   Luis [name] 1:47 PM 03/24/2022

## 2022-03-24 NOTE — TOC Transition Note (Addendum)
Transition of Care [facility]) - CM/SW Discharge Note   Patient Details  Name: Luis [name] MRN: [id number] Date of Birth: 05-23-1971  Transition of [facility]) CM/SW Contact:  Zenon Mayo, RN Phone Number: 03/24/2022, 2:48 PM   Clinical Narrative:    Patient will be for dc tomorrow per MD,  NCM asked patient if he has any [place] services, offered choice,  He states  he is active with [facility] per patient and he would like to continue with [facility].  NCM confirmed with Malachy Mood with [facility], he is active with them for HHPT.  Services will resume at dc. Also patient states he had PCS services with [facility] [place]. NCM spoke with Dorthey Sawyer he states NCM will need to call [place] PCS services to reinstate.  NCM called [place] , they states the MD will need to fill out the 3051 form and it will need to be in today before 4:30 to be expedited. NCM notified MD of this information. MD states will fill out form.  NCM faxed the expedited form to [place].    Final next level of care: [place] Barriers to Discharge: Continued Medical Work up   Patient Goals and CMS Choice Patient states their goals for this hospitalization and ongoing recovery are:: return home CMS Medicare.gov Compare Post Acute Care list provided to:: Patient Choice offered to / list presented to : Patient  Discharge Placement                       Discharge Plan and Services                  DME Agency: NA       HH Arranged: PT HH Agency: [place] Date [place]: 03/24/22 Time [place]: 1448 Representative spoke with at [place]: [place] Determinants of Health (SDOH) Interventions Food Insecurity Interventions: Intervention Not Indicated Housing Interventions: Intervention Not Indicated Transportation Interventions: Intervention Not Indicated Utilities Interventions: Intervention Not  Indicated Alcohol Usage Interventions: Intervention Not Indicated (Score <7) Financial Strain Interventions: Intervention Not Indicated   Readmission Risk Interventions    01/31/2021   12:26 PM 01/31/2021    9:25 AM  Readmission Risk Prevention Plan  Transportation Screening Complete   PCP or Specialist Appt within 3-5 Days Complete Complete  HRI or Home Care Consult Complete Complete  Social Work Consult for [place] Planning/Counseling Patient refused Complete  Palliative Care Screening Not Applicable Not Applicable  Medication Review Press photographer) Complete Referral to Pharmacy

## 2022-03-24 NOTE — Progress Notes (Signed)
Tegaderm removed Steri strips remain in place Wound care instructions were reviewed with the patient EP follow up is in place  We will sign off though remain available Please recall if needed  Tommye Standard, PA-C

## 2022-03-25 ENCOUNTER — Telehealth (HOSPITAL_COMMUNITY): Payer: Self-pay

## 2022-03-25 LAB — BASIC METABOLIC PANEL
Anion gap: 11 (ref 5–15)
BUN: 32 mg/dL — ABNORMAL HIGH (ref 6–20)
CO2: 23 mmol/L (ref 22–32)
Calcium: 8.8 mg/dL — ABNORMAL LOW (ref 8.9–10.3)
Chloride: 103 mmol/L (ref 98–111)
Creatinine, Ser: 1.1 mg/dL (ref 0.61–1.24)
GFR, Estimated: >60 mL/min (ref >60)
Glucose, Bld: 142 mg/dL — ABNORMAL HIGH (ref 70–99)
Potassium: 4 mmol/L (ref 3.5–5.1)
Sodium: 137 mmol/L (ref 135–145)

## 2022-03-25 LAB — MAGNESIUM: Magnesium: 2.2 mg/dL (ref 1.7–2.4)

## 2022-03-25 LAB — GLUCOSE, CAPILLARY
Glucose-Capillary: 156 mg/dL — ABNORMAL HIGH (ref 70–99)
Glucose-Capillary: 287 mg/dL — ABNORMAL HIGH (ref 70–99)

## 2022-03-25 NOTE — Progress Notes (Signed)
Heart Failure Stewardship Pharmacist Progress Note   PCP: Buzzy Han, MD (Inactive) PCP-Cardiologist: Glenetta Hew, MD    HPI:  51 yo M with PMH of HTN, T2DM, HLD, HFrEF since ~2000, polysubstance use, OSA on CPAP, and PPM.    He presented to the ED on 02/26/22 with chest pain, shortness of breath, and LE edema. He reports he took 320 mg of lasix without any improvement. CXR with mild vascular congestion without pulmonary edema. ECHO on 9/1 showed LVEF 25%, RV mildly reduced. [place] on 9/8 with relatively normal filling pressures and mildly reduced cardiac output (RA 8, PA 22, wedge 13, CO 4.3, CI 1.8). He was discharged on 9/10 with carvedilol, Farxiga, lasix, Entresto and spironolactone.  He had hospital follow up with [facility] on 9/18 and felt lightheadedness and reported falls. Also complains of orthopnea, weight gain of 18 lbs, LE edema, fatigue, chest discomfort, and shortness of breath. He tried additional 80 mg of lasix without any improvement. Sent to ED for evaluation and admitted for IV diuresis. CXR with cardiomegaly. Discharged delayed with orthopnea and dizziness/fall overnight 9/23. ICD generator changeout on 9/25.  Current HF Medications: Diuretic: torsemide 40 mg PO daily Beta Blocker: carvedilol 6.25 mg BID ACE/ARB/ARNI: Entresto 24/26 mg BID MRA: spironolactone 25 mg daily SGLT2i: Farxiga 10 mg daily  Prior to admission HF Medications: Diuretic: furosemide 80 mg daily Beta blocker: carvedilol 12.5 mg BID ACE/ARB/ARNI: Entresto 24/26 mg BID MRA: spironolactone 25 mg daily SGLT2i: Farxiga 10 mg daily  Pertinent Lab Values: Serum creatinine 1.10, BUN 32, Potassium 4.0, Sodium 137, Magnesium 2.2, BNP 103.9, A1c 9.1   Vital Signs: Weight: 298 lbs (admission weight: 305 lbs) Blood pressure: 120/70s  Heart rate: 80s  I/O: -3.7L yesterday; net -21L  Medication Assistance / Insurance Benefits Check: Does the patient have prescription insurance?  Yes Type of  insurance plan: [place] Medicaid   Outpatient Pharmacy:  Prior to admission outpatient pharmacy: [facility] Is the patient willing to use [place] pharmacy at discharge? Yes Is the patient willing to transition their outpatient pharmacy to utilize a [facility] outpatient pharmacy?   Pending    Assessment: 1. Acute on chronic systolic CHF (LVEF 51%), due to NICM. NYHA class II symptoms.   - Continue torsemide 40 mg PO daily. Strict I/O and daily weights. Keep K>4 and Mag>2. - Continue carvedilol 6.25 mg BID. Reports improvement in dizziness with dose reduction.  - Continue Entresto 24/26 mg BID - Continue spironolactone 25 mg daily - Continue Farxiga 10 mg daily   Plan: 1) Medication changes recommended at this time: - Continue current regimen  2) Patient assistance: - Has  Medicaid  3)  Education  - Patient has been educated on current HF medications and potential additions to HF medication regimen - Patient verbalizes understanding that over the next few months, these medication doses may change and more medications may be added to optimize HF regimen - Patient has been educated on basic disease state pathophysiology and goals of therapy   Kerby Nora, PharmD, BCPS Heart Failure Stewardship Pharmacist Phone [phone]

## 2022-03-25 NOTE — Plan of Care (Signed)
  Problem: Acute Rehab PT Goals(only PT should resolve) Goal: Patient Will Transfer Sit To/From Stand Outcome: Adequate for Discharge Goal: Pt Will Ambulate Outcome: Adequate for Discharge Goal: Pt Will Go Up/Down Stairs Outcome: Adequate for Discharge   

## 2022-03-25 NOTE — Plan of Care (Signed)
  Problem: Education: Goal: Understanding of CV disease, CV risk reduction, and recovery process will improve Outcome: Adequate for Discharge Goal: Individualized Educational Video(s) Outcome: Adequate for Discharge   Problem: Cardiovascular: Goal: Ability to achieve and maintain adequate cardiovascular perfusion will improve Outcome: Adequate for Discharge Goal: Vascular access site(s) Level 0-1 will be maintained Outcome: Adequate for Discharge   Problem: Health Behavior/Discharge Planning: Goal: Ability to safely manage health-related needs after discharge will improve Outcome: Adequate for Discharge   Problem: Education: Goal: Ability to describe self-care measures that may prevent or decrease complications (Diabetes Survival Skills Education) will improve Outcome: Adequate for Discharge Goal: Individualized Educational Video(s) Outcome: Adequate for Discharge   Problem: Coping: Goal: Ability to adjust to condition or change in health will improve Outcome: Adequate for Discharge   Problem: Fluid Volume: Goal: Ability to maintain a balanced intake and output will improve Outcome: Adequate for Discharge   Problem: Health Behavior/Discharge Planning: Goal: Ability to identify and utilize available resources and services will improve Outcome: Adequate for Discharge Goal: Ability to manage health-related needs will improve Outcome: Adequate for Discharge   Problem: Metabolic: Goal: Ability to maintain appropriate glucose levels will improve Outcome: Adequate for Discharge   Problem: Nutritional: Goal: Maintenance of adequate nutrition will improve Outcome: Adequate for Discharge Goal: Progress toward achieving an optimal weight will improve Outcome: Adequate for Discharge   Problem: Skin Integrity: Goal: Risk for impaired skin integrity will decrease Outcome: Adequate for Discharge   Problem: Tissue Perfusion: Goal: Adequacy of tissue perfusion will improve Outcome:  Adequate for Discharge   Problem: Education: Goal: Knowledge of General Education information will improve Description: Including pain rating scale, medication(s)/side effects and non-pharmacologic comfort measures Outcome: Adequate for Discharge   Problem: Health Behavior/Discharge Planning: Goal: Ability to manage health-related needs will improve Outcome: Adequate for Discharge   Problem: Clinical Measurements: Goal: Ability to maintain clinical measurements within normal limits will improve Outcome: Adequate for Discharge Goal: Will remain free from infection Outcome: Adequate for Discharge Goal: Diagnostic test results will improve Outcome: Adequate for Discharge Goal: Respiratory complications will improve Outcome: Adequate for Discharge Goal: Cardiovascular complication will be avoided Outcome: Adequate for Discharge   Problem: Activity: Goal: Risk for activity intolerance will decrease Outcome: Adequate for Discharge   Problem: Nutrition: Goal: Adequate nutrition will be maintained Outcome: Adequate for Discharge   Problem: Coping: Goal: Level of anxiety will decrease Outcome: Adequate for Discharge   Problem: Elimination: Goal: Will not experience complications related to bowel motility Outcome: Adequate for Discharge Goal: Will not experience complications related to urinary retention Outcome: Adequate for Discharge   Problem: Pain Managment: Goal: General experience of comfort will improve Outcome: Adequate for Discharge   Problem: Safety: Goal: Ability to remain free from injury will improve Outcome: Adequate for Discharge   Problem: Skin Integrity: Goal: Risk for impaired skin integrity will decrease Outcome: Adequate for Discharge   Problem: Education: Goal: Knowledge of cardiac device and self-care will improve Outcome: Adequate for Discharge Goal: Ability to safely manage health related needs after discharge will improve Outcome: Adequate for  Discharge Goal: Individualized Educational Video(s) Outcome: Adequate for Discharge   Problem: Cardiac: Goal: Ability to achieve and maintain adequate cardiopulmonary perfusion will improve Outcome: Adequate for Discharge

## 2022-03-25 NOTE — Progress Notes (Signed)
Physical Therapy Treatment Patient Details Name: Luis [name] MRN: [id number] DOB: 07-24-1970 Today's Date: 03/25/2022   History of Present Illness Pt is a 51 y/o male admitted 03/16/22 secondary to SOB; workup for CHF. PMH includes pacemaker, DM, tobacco use, HTN, substance abuse.   PT Comments    Pt seen for session before d/c per MD and pt request. Pt ambulating independently without assist; pt indep with supplemental O2 management and monitoring SpO2 at home. Increased time educ re: activity recommendations, energy conservation strategies (handout provided), including compensatory strategies for ADL tasks. Pt reports no further questions or concerns, preparing for d/c home this afternoon. Acute PT will sign off.    Recommendations for follow up therapy are one component of a multi-disciplinary discharge planning process, led by the attending physician.  Recommendations may be updated based on patient status, additional functional criteria and insurance authorization.  Follow Up Recommendations  Home health PT     Assistance Recommended at Discharge PRN  Patient can return home with the following Assist for transportation;Assistance with cooking/housework   Equipment Recommendations  None recommended by PT    Recommendations for Other Services       Precautions / Restrictions Precautions Precautions: Other (comment) Precaution Comments: Watch SpO2 (wears O2 [place] baseline) Restrictions Weight Bearing Restrictions: No     Mobility  Bed Mobility Overal bed mobility: Independent                  Transfers Overall transfer level: Independent Equipment used: None Transfers: Sit to/from Stand                  Ambulation/Gait Ambulation/Gait assistance: Modified independent (Device/Increase time) Gait Distance (Feet): 60 Feet Assistive device: None Gait Pattern/deviations: Step-through pattern, Decreased stride length Gait velocity: Decreased      General Gait Details: pt walking laps around room without DME, mod indep for increased time, no overt instability or LOB noted; pt briefly removing O2 [place] himself to walk out to doorway, SpO2 100% on RA before pt redonned [place]   Stairs             Wheelchair Mobility    Modified Rankin (Stroke Patients Only)       Balance Overall balance assessment: No apparent balance deficits (not formally assessed)                                          Cognition Arousal/Alertness: Awake/alert Behavior During Therapy: WFL for tasks assessed/performed, Anxious Overall Cognitive Status: Within Functional Limits for tasks assessed                                          Exercises      General Comments General comments (skin integrity, edema, etc.): educ on activity recommendations and energy conservation strategies (handout provided), including compensatory strategies with ADLs, specifically bathing and LB tasks      Pertinent Vitals/Pain Pain Assessment Pain Assessment: No/denies pain Pain Intervention(s): Monitored during session    Home Living                          Prior Function            PT Goals (current goals can now be found in the  care plan section) Progress towards PT goals: Goals met/education completed, patient discharged from PT    Frequency    Min 3X/week      PT Plan Current plan remains appropriate    Co-evaluation              AM-PAC PT "6 Clicks" Mobility   Outcome Measure  Help needed turning from your back to your side while in a flat bed without using bedrails?: None Help needed moving from lying on your back to sitting on the side of a flat bed without using bedrails?: None Help needed moving to and from a bed to a chair (including a wheelchair)?: None Help needed standing up from a chair using your arms (e.g., wheelchair or bedside chair)?: None Help needed to walk in hospital room?:  None Help needed climbing 3-5 steps with a railing? : A Little 6 Click Score: 23    End of Session Equipment Utilized During Treatment: Oxygen Activity Tolerance: Patient tolerated treatment well Patient left: with call bell/phone within reach;with nursing/sitter in room;Other (comment) (preparing for d/c home) Nurse Communication: Mobility status PT Visit Diagnosis: Unsteadiness on feet (R26.81);Muscle weakness (generalized) (M62.81);Difficulty in walking, not elsewhere classified (R26.2)     Time: 1217-1226 PT Time Calculation (min) (ACUTE ONLY): 9 min  Charges:  $Self Care/Home Management: [place], PT, DPT Acute Rehabilitation Services  Personal: [place] [facility] Office: [place] 03/25/2022, 12:36 PM

## 2022-03-25 NOTE — Telephone Encounter (Signed)
Called patient to see if she is interested in the Cardiac Rehab Program. Patient expressed interest. Explained scheduling process, patient verbalized understanding. Will contact patient for scheduling once f/u has been completed. 

## 2022-03-25 NOTE — Progress Notes (Addendum)
CARDIAC REHAB PHASE I   PRE:  Rate/Rhythm: 81 NS V paced  BP:  Sitting: 118/61      SaO2: 98 2L  MODE:  Ambulation: 430 ft   POST:  Rate/Rhythm: 97 NS V-paced  BP:  Sitting: 121/71      SaO2: 100 2L  Pt was ambulated in hallway with standby assistance with 3L. Pt reported consistent 3/10 dizziness throughout ambulation, and took rest breaks whenever he got too tired. Pt was returned to room w/o complaint. Pt was told to remain on low NA diet, do daily weights, and continue exercise when d/c. Pt is looking forward to CRPII.    Christen Bame  11:13 AM 03/25/2022

## 2022-03-25 NOTE — TOC Transition Note (Addendum)
Transition of Care [facility]) - CM/SW Discharge Note   Patient Details  Name: Luis [name] MRN: [id number] Date of Birth: Nov 11, 1970  [facility] CM/SW Contact:  Zenon Mayo, RN Phone Number: 03/25/2022, 12:23 PM   Clinical Narrative:    Patient is for dc today, NCM informed patient that the [facility] services will get in contact with him.  He states yes they usually do.  He has the oxygen tank in the room and he has his medications from TOC .  He has transportation to get home.  He states [place] be going out of [place] and will not be back until 2024. NCM informed him to take good care of himself with his diet, he states he will.  PT is seeing him today again.  He is set up with HHPT also with [facility]. Cheryl with [facility] notified of dc.   Final next level of care: [place] Barriers to Discharge: Continued Medical Work up   Patient Goals and CMS Choice Patient states their goals for this hospitalization and ongoing recovery are:: return home CMS Medicare.gov Compare Post Acute Care list provided to:: Patient Choice offered to / list presented to : Patient  Discharge Placement                       Discharge Plan and Services                  DME Agency: NA       HH Arranged: PT HH Agency: [place] Date [place]: 03/24/22 Time [place]: 1448 Representative spoke with at [place]: [place] Determinants of Health (SDOH) Interventions Food Insecurity Interventions: Intervention Not Indicated Housing Interventions: Intervention Not Indicated Transportation Interventions: Intervention Not Indicated Utilities Interventions: Intervention Not Indicated Alcohol Usage Interventions: Intervention Not Indicated (Score <7) Financial Strain Interventions: Intervention Not Indicated   Readmission Risk Interventions    01/31/2021   12:26 PM 01/31/2021    9:25 AM  Readmission  Risk Prevention Plan  Transportation Screening Complete   PCP or Specialist Appt within 3-5 Days Complete Complete  HRI or Home Care Consult Complete Complete  Social Work Consult for [place] Planning/Counseling Patient refused Complete  Palliative Care Screening Not Applicable Not Applicable  Medication Review Press photographer) Complete Referral to Pharmacy

## 2022-03-30 ENCOUNTER — Encounter (HOSPITAL_COMMUNITY): Payer: Self-pay

## 2022-03-30 ENCOUNTER — Ambulatory Visit (HOSPITAL_COMMUNITY)
Admit: 2022-03-30 | Discharge: 2022-03-30 | Disposition: A | Discharge status: Home / Self Care | Payer: Medicare Other | Attending: Cardiology | Admitting: Cardiology

## 2022-03-30 ENCOUNTER — Telehealth (HOSPITAL_COMMUNITY): Payer: Self-pay | Admitting: *Deleted

## 2022-03-30 VITALS — BP 128/74 | HR 82 | Wt 300.8 lb

## 2022-03-30 DIAGNOSIS — I5022 Chronic systolic (congestive) heart failure: Secondary | ICD-10-CM | POA: Insufficient documentation

## 2022-03-30 DIAGNOSIS — I110 Hypertensive heart disease with heart failure: Secondary | ICD-10-CM | POA: Insufficient documentation

## 2022-03-30 DIAGNOSIS — F1410 Cocaine abuse, uncomplicated: Secondary | ICD-10-CM | POA: Diagnosis not present

## 2022-03-30 DIAGNOSIS — Z794 Long term (current) use of insulin: Secondary | ICD-10-CM | POA: Diagnosis not present

## 2022-03-30 DIAGNOSIS — E119 Type 2 diabetes mellitus without complications: Secondary | ICD-10-CM | POA: Insufficient documentation

## 2022-03-30 DIAGNOSIS — I2510 Atherosclerotic heart disease of native coronary artery without angina pectoris: Secondary | ICD-10-CM | POA: Insufficient documentation

## 2022-03-30 DIAGNOSIS — G4733 Obstructive sleep apnea (adult) (pediatric): Secondary | ICD-10-CM | POA: Insufficient documentation

## 2022-03-30 DIAGNOSIS — F1010 Alcohol abuse, uncomplicated: Secondary | ICD-10-CM | POA: Insufficient documentation

## 2022-03-30 DIAGNOSIS — E785 Hyperlipidemia, unspecified: Secondary | ICD-10-CM | POA: Insufficient documentation

## 2022-03-30 DIAGNOSIS — Z79899 Other long term (current) drug therapy: Secondary | ICD-10-CM | POA: Insufficient documentation

## 2022-03-30 DIAGNOSIS — I428 Other cardiomyopathies: Secondary | ICD-10-CM | POA: Insufficient documentation

## 2022-03-30 DIAGNOSIS — F32A Depression, unspecified: Secondary | ICD-10-CM | POA: Insufficient documentation

## 2022-03-30 DIAGNOSIS — Z7984 Long term (current) use of oral hypoglycemic drugs: Secondary | ICD-10-CM | POA: Diagnosis not present

## 2022-03-30 DIAGNOSIS — Z7902 Long term (current) use of antithrombotics/antiplatelets: Secondary | ICD-10-CM | POA: Diagnosis not present

## 2022-03-30 LAB — LIPID PANEL
Cholesterol: 129 mg/dL (ref 0–200)
HDL: 35 mg/dL — ABNORMAL LOW (ref >40)
LDL Cholesterol: UNDETERMINED mg/dL (ref 0–99)
Total CHOL/HDL Ratio: 3.7 RATIO
Triglycerides: 405 mg/dL — ABNORMAL HIGH (ref <150)
VLDL: UNDETERMINED mg/dL (ref 0–40)

## 2022-03-30 LAB — COMPREHENSIVE METABOLIC PANEL
ALT: 22 U/L (ref 0–44)
AST: 20 U/L (ref 15–41)
Albumin: 3.6 g/dL (ref 3.5–5.0)
Alkaline Phosphatase: 61 U/L (ref 38–126)
Anion gap: 7 (ref 5–15)
BUN: 15 mg/dL (ref 6–20)
CO2: 29 mmol/L (ref 22–32)
Calcium: 9 mg/dL (ref 8.9–10.3)
Chloride: 100 mmol/L (ref 98–111)
Creatinine, Ser: 1.09 mg/dL (ref 0.61–1.24)
GFR, Estimated: >60 mL/min (ref >60)
Glucose, Bld: 278 mg/dL — ABNORMAL HIGH (ref 70–99)
Potassium: 4.5 mmol/L (ref 3.5–5.1)
Sodium: 136 mmol/L (ref 135–145)
Total Bilirubin: 0.5 mg/dL (ref 0.3–1.2)
Total Protein: 7 g/dL (ref 6.5–8.1)

## 2022-03-30 LAB — LDL CHOLESTEROL, DIRECT: Direct LDL: 61 mg/dL (ref 0–99)

## 2022-03-30 MED ORDER — DAPAGLIFLOZIN PROPANEDIOL 10 MG PO TABS: 10.0000 mg | ORAL_TABLET | Freq: Every day | ORAL | 6 refills | Status: DC

## 2022-03-30 MED ORDER — SPIRONOLACTONE 25 MG PO TABS: 25.0000 mg | ORAL_TABLET | Freq: Every day | ORAL | 3 refills | Status: DC

## 2022-03-30 MED ORDER — ENTRESTO 49-51 MG PO TABS: 1.0000 | ORAL_TABLET | Freq: Two times a day (BID) | ORAL | 3 refills | Status: DC

## 2022-03-30 MED ORDER — CARVEDILOL 6.25 MG PO TABS: 6.2500 mg | ORAL_TABLET | Freq: Two times a day (BID) | ORAL | 2 refills | Status: DC

## 2022-03-30 MED ORDER — TORSEMIDE 20 MG PO TABS: 40.0000 mg | ORAL_TABLET | Freq: Every day | ORAL | 3 refills | Status: DC

## 2022-03-30 MED ORDER — DIGOXIN 125 MCG PO TABS: 0.1250 mg | ORAL_TABLET | Freq: Every day | ORAL | 3 refills | Status: DC

## 2022-03-30 NOTE — Progress Notes (Signed)
HEART & VASCULAR TRANSITION OF CARE CONSULT NOTE     Referring Physician: Dr. Gardiner Rhyme Primary Care: Buzzy Han, MD (Inactive) Primary Cardiologist: Dr. Ellyn Hack (previously followed at [facility])   HPI: Referred to clinic by Dr. Gardiner Rhyme for heart failure consultation.   51 y/o male w/ h/o chronic systolic heart failure, h/o VT s/p ICD (MDT CRT-D), HTN, HLD, Type 2 IDDM, OSA on CPAP and h/o substance use. He has reported h/o CAD in Care Everywhere and prior coronary stenting and his CM has been classified as NICM. Unfortunately, I am unable to locate LHC/ angiographic details in Care Everywhere. Echos from 2021-2023 have shown LVEF ranging from 25-35%, RV mildly reduced. CM felt likely caused by cocaine use.   He was admitted to [facility] 9/23 for a/c CHF.  ICD was also noted to be at [facility]. Echo showed LVEF 25% w/ global HK, RV mildly reduced. He was diuresed w/ IV Lasix and underwent RHC revealing normal filling pressures and reduced CO w/ CI of 1.82 L/min/m. He was placed on GDMT and transitioned to PO torsemide. Underwent gen change on 9/25 prior to d/c. D/c wt 296 lb. Referred to [facility] clinic at d/c.   He presents today for post hospital f/u. Very depressed and tearful. Says he "feels like giving up sometimes". Depressed regarding medical conditions but mainly depressed by lack of social/family support. Lives alone. No local relatives/ friends. Wife passed away 3 years ago. Has 20 year old son that lives w/ his older daughter in [place]. His son had to move to [facility] given his inability to care for him. He used to cope by using cocaine but reports he has not used in 3 months. Denies tobacco and ETOH use.   Hasn't been eating much due to depression + reports food insecurity. Does not have transportation. Had to use [facility] to get to appt.   Reports compliance w/ Meds. Asking if he can transition to pill packs.   Reports chronic NYHA Class III symptoms, though confounded by inactivity due to  depression. Reports orthopnea. No PND. Denies CP. BP 128/74. Device interrogation shows no AF. No VT/VF. No HF diagnostic capabilities.    Cardiac Testing   Echocardiogram 02/2022: Impressions:  1. Left ventricular ejection fraction, by estimation, is 25%. The left  ventricle has severely decreased function. The left ventricle demonstrates  global hypokinesis. Cannot comment on regional wall motion abnormalities  and Definity contrast was not  attempted. Consider repeat limited echo for RWM assessment if clinically  indicated. The left ventricular internal cavity size was mildly dilated.  Left ventricular diastolic parameters are indeterminate.   2. Right ventricular systolic function is mildly reduced. The right  ventricular size is normal. Tricuspid regurgitation signal is inadequate  for assessing PA pressure.   3. The mitral valve is grossly normal. Trivial mitral valve  regurgitation. No evidence of mitral stenosis.   4. The aortic valve is grossly normal. There is mild calcification of the  aortic valve. Aortic valve regurgitation is not visualized. No aortic  stenosis is present.   5. The inferior vena cava is normal in size with greater than 50%  respiratory variability, suggesting right atrial pressure of 3 mmHg.  _______________   Right Cardiac Catheterization 02/2022: 1.  Relatively normal filling pressures with a mean RA pressure of 8 mmHg, mean wedge pressure of 13 mmHg, mean PA pressure of 22 mmHg, with a mildly reduced Fick cardiac output of 4.28 L/min and index of 1.82 L/min/m _______________  ICD Generator Change 03/23/2022: Impressions: 1. Successful generator replacement with a Medtronic CRT ICD device  2. Sedation time 28 minutes  3. No early apparent complications.     Review of Systems: [y] = yes, [ ]  = no   General: Weight gain [ ] ; Weight loss [ ] ; Anorexia [ ] ; Fatigue [ ] ; Fever [ ] ; Chills [ ] ; Weakness [ ]   Cardiac: Chest pain/pressure [ ] ; Resting  SOB [ ] ; Exertional SOB [ Y]; Orthopnea [ Y]; Pedal Edema [ ] ; Palpitations [ ] ; Syncope [ ] ; Presyncope [ ] ; Paroxysmal nocturnal dyspnea[ ]   Pulmonary: Cough [ ] ; Wheezing[ ] ; Hemoptysis[ ] ; Sputum [ ] ; Snoring [ ]   GI: Vomiting[ ] ; Dysphagia[ ] ; Melena[ ] ; Hematochezia [ ] ; Heartburn[ ] ; Abdominal pain [ ] ; Constipation [ ] ; Diarrhea [ ] ; BRBPR [ ]   GU: Hematuria[ ] ; Dysuria [ ] ; Nocturia[ ]   Vascular: Pain in legs with walking [ ] ; Pain in feet with lying flat [ ] ; Non-healing sores [ ] ; Stroke [ ] ; TIA [ ] ; Slurred speech [ ] ;  Neuro: Headaches[ ] ; Vertigo[ ] ; Seizures[ ] ; Paresthesias[ ] ;Blurred vision [ ] ; Diplopia [ ] ; Vision changes [ ]   Ortho/Skin: Arthritis [ ] ; Joint pain [ ] ; Muscle pain [ ] ; Joint swelling [ ] ; Back Pain [ ] ; Rash [ ]   Psych: Depression[ Y]; Anxiety[ ]   Heme: Bleeding problems [ ] ; Clotting disorders [ ] ; Anemia [ ]   Endocrine: Diabetes [Y ]; Thyroid dysfunction[ ]    Past Medical History:  Diagnosis Date   Anxiety    Biventricular ICD (implantable cardioverter-defibrillator) in place    Chronic systolic CHF (congestive heart failure) (HCC)    Cocaine use    Diabetes mellitus without complication (HCC)    GERD (gastroesophageal reflux disease)    HLD (hyperlipidemia)    Hypertension    Morbid obesity (HCC)    NICM (nonischemic cardiomyopathy) (HCC)    OSA (obstructive sleep apnea)    PTSD (post-traumatic stress disorder)    on Depakote   Refusal of blood transfusions as patient is Jehovah's Witness    Tobacco abuse     Current Outpatient Medications  Medication Sig Dispense Refill   clopidogrel (PLAVIX) 75 MG tablet Take 1 tablet (75 mg total) by mouth daily. 30 tablet 1   digoxin (LANOXIN) 0.125 MG tablet Take 1 tablet (0.125 mg total) by mouth daily. 30 tablet 3   DULoxetine (CYMBALTA) 30 MG capsule Take 3 capsules (90 mg total) by mouth daily. 90 capsule 1   hydrOXYzine (ATARAX) 25 MG tablet Take 1 tablet (25 mg total) by mouth 3 (three) times  daily as needed for anxiety. 75 tablet 1   Insulin Lispro Prot & Lispro (HUMALOG MIX 75/25 KWIKPEN) (75-25) 100 UNIT/ML Kwikpen Inject 70 Units into the skin 3 (three) times daily.     LANTUS SOLOSTAR 100 UNIT/ML Solostar Pen Inject 50 Units into the skin 2 (two) times daily.     OXYGEN Inhale 2-3 L into the lungs continuous.     pantoprazole (PROTONIX) 40 MG tablet Take 1 tablet (40 mg total) by mouth daily. 30 tablet 1   prazosin (MINIPRESS) 2 MG capsule Take 1 capsule (2 mg total) by mouth at bedtime. 30 capsule 1   rosuvastatin (CRESTOR) 20 MG tablet Take 1 tablet (20 mg total) by mouth daily. 30 tablet 1   sacubitril-valsartan (ENTRESTO) 49-51 MG Take 1 tablet by mouth 2 (two) times daily. 60 tablet 3   terbinafine (LAMISIL) 1 % cream Apply  topically 2 (two) times daily. (Patient taking differently: Apply 1 Application topically 2 (two) times daily. As needed) 30 g 0   traZODone (DESYREL) 100 MG tablet Take 2 tablets (200 mg total) by mouth at bedtime. 60 tablet 1   TRULICITY 1.5 0000000 SOPN Inject 1.5 mg into the skin once a week. 0.5 mL 4   carvedilol (COREG) 6.25 MG tablet Take 1 tablet (6.25 mg total) by mouth 2 (two) times daily with a meal. 60 tablet 2   dapagliflozin propanediol (FARXIGA) 10 MG TABS tablet Take 1 tablet (10 mg total) by mouth daily. 30 tablet 6   spironolactone (ALDACTONE) 25 MG tablet Take 1 tablet (25 mg total) by mouth daily. 30 tablet 3   torsemide (DEMADEX) 20 MG tablet Take 2 tablets (40 mg total) by mouth daily. 60 tablet 3   No current facility-administered medications for this encounter.    Allergies  Allergen Reactions   Aspirin Anaphylaxis and Other (See Comments)    Throat closing    Iodine-131 Hives   Iodinated Contrast Media Hives   Latex Hives and Rash   Penicillins Nausea And Vomiting   Ultram [Tramadol] Hives and Nausea Only   Augmentin [Amoxicillin-Pot Clavulanate] Diarrhea   Lidocaine Rash   Zestril [Lisinopril] Cough      Social  History   Socioeconomic History   Marital status: Widowed    Spouse name: Not on file   Number of children: 7   Years of education: Not on file   Highest education level: Bachelor's degree (e.g., BA, AB, BS)  Occupational History   Occupation: disability  Tobacco Use   Smoking status: Former    Packs/day: 0.20    Types: Cigarettes    Quit date: 10/2021    Years since quitting: 0.4   Smokeless tobacco: Never   Tobacco comments:    occasionally  Vaping Use   Vaping Use: Never used  Substance and Sexual Activity   Alcohol use: Not Currently   Drug use: Yes    Types: Cocaine    Comment: last used july   Sexual activity: Not Currently  Other Topics Concern   Not on file  Social History Narrative   Not on file   Social Determinants of Health   Financial Resource Strain: Low Risk  (03/17/2022)   Overall Financial Resource Strain (CARDIA)    Difficulty of Paying Living Expenses: Not hard at all  Food Insecurity: No Food Insecurity (03/17/2022)   Hunger Vital Sign    Worried About Running Out of Food in the Last Year: Never true    Ran Out of Food in the Last Year: Never true  Transportation Needs: No Transportation Needs (03/17/2022)   PRAPARE - Hydrologist (Medical): No    Lack of Transportation (Non-Medical): No  Physical Activity: Not on file  Stress: Not on file  Social Connections: Not on file  Intimate Partner Violence: Not on file      Family History  Problem Relation Age of Onset   Hypertension Mother    Bone cancer Father    Lung cancer Father     Vitals:   03/30/22 1156  BP: 128/74  Pulse: 82  SpO2: 95%  Weight: (!) 136.4 kg (300 lb 12.8 oz)    PHYSICAL EXAM: General: moderately obese middle age male in [facility], tearful/depressed appearing .No respiratory difficulty HEENT: normal Neck: supple. JVD not elevated Carotids 2+ bilat; no bruits. No lymphadenopathy or thryomegaly appreciated. Cor: PMI nondisplaced.  Regular rate &  rhythm. No rubs, gallops or murmurs. Lungs: clear Abdomen: soft, nontender, nondistended. No hepatosplenomegaly. No bruits or masses. Good bowel sounds. Extremities: no cyanosis, clubbing, rash, trace b/l pretibial edema Neuro: alert & oriented x 3, cranial nerves grossly intact. moves all 4 extremities w/o difficulty. Depressed   ECG: Not performed    ASSESSMENT & PLAN:  Chronic Systolic Heart Failure - Echos 2021-2023, LVEF range 25-35%, RV mildly reduced - Reportedly diagnosed with nonischemic cardiomyopathy in 2012 in [place], etiology felt due to cocaine abuse.  [place] cardiology 03/07/2019 notes mention patient has CAD with history of angioplasty in the past. Unable to locate cath reports/ details regarding coronary anatomy in record. Denies recent anginal symptomatology  - Recent Echo 9/23 LVEF 25%, RV mildly reduced - RHC 9/23 w/ normal filling pressures and low output, CI 1.82. Did not get LHC  - NYHA Class III, confounded by inactivity/deconditioning due to chronic depression  - Grossly euvolemic on exam. Has MDT CRT-D device. Interrogation shows no VT/VF. 96% V paced  - Increase Entresto 49-51 mg bid  - Continue Farxiga 10 mg daily - Continue Spironolactone 25 mg daily  - Continue Torsemide 40 mg daily. If Scr elevated on labs, will scale back to QOD dosing   - Continue Coreg 6.25 mg bid  - Add Digoxin 0.125 mg daily  - Check CMP today. Plan f/u BMP and dig level in 1 wk - Will refer to the [facility] for further management. Assign to Dr. Aundra Dubin  - Will plan to switch to pill packs once meds optimized and on stable regimen   2. CAD - documented in Care Everywhere, reported h/o angioplasty, details unknown - he denies recent anginal symptomatolgy but may need to be restudied, given persistently low EF.  Will plan to refer to [facility]. If EF not improved w/ restart/optimization of meds, may consider LHC down the road - continue Plavix 75 mg daily  - continue ? blocker - currently not  on statin. Will check FLP and HLTs. Will need initiation of statin if LDL not < 55 mg/dL   3. H/o VT - Has CRT-D w/ chronic systolic heart failure and QRS > 150 ms  - s/p recent gen change 9/23 - followed by [facility] device clinic  - no VT on device interrogation toady   4. HTN - controlled on current regimen - GDMT titration per above, increasing Entresto  - CMP today   5. Type 2DM  - uncontrolled, Hgb A1c 9.1 - continue Insulin per PCP  - now on Farxiga   6. H/o Substance/ Cocaine Use - denies use of cocaine x 3 months  - abstinence imperative, reiterated this today   7. Depression  - depressed due to lack of social/family support and food insecurity  - offered support in clinic. After completion of conversation, pt denied SI/HI - connected w/ HF SW to assist w/ referral to counseling - food bag provided from food pantry   - pt appreciative of assistance    NYHA III GDMT  Diuretic- torsemide 40 mg daily  BB- Coreg 6.25 mg bid  Ace/ARB/ARNI Entresto 49-51 mg bid  MRA Spironolactone 25 mg daily  SGLT2i Farxiga 10 mg daily    Increase Entresto 49-51 mg bid Add digoxin 0.125 mg daily   CMP, BNP and Lipid panel today   F/u BMP and dig level in 1 wk F/u APP clinic in 2-3 wks. Assign to Dr. Aundra Dubin    Referred to HFSW (PCP, Medications,  Transportation, ETOH Abuse, Drug Abuse, Insurance, Museum/gallery curator ): Yes  Refer to Pharmacy: No Refer to Home Health: No Refer to Advanced Heart Failure Clinic: Yes (assign to Dr. Aundra Dubin) Refer to General Cardiology: Yes (shared care w/ EP)   Follow up  in 2-3 wks in APP clinic

## 2022-03-30 NOTE — Patient Instructions (Signed)
Medication Changes:  Increase Entresto to 49/51 mg Twice daily  START Digoxin 0.125 mg Daily  Lab Work:  Labs done today, your results will be available in MyChart, we will contact you for abnormal readings.  Your physician recommends that you return for lab work in: 1 week  Testing/Procedures:  none  Referrals:  none  Special Instructions // Education:  Do the following things EVERYDAY: Weigh yourself in the morning before breakfast. Write it down and keep it in a log. Take your medicines as prescribed Eat low salt foods--Limit salt (sodium) to 2000 mg per day.  Stay as active as you can everyday Limit all fluids for the day to less than 2 liters  Follow-Up in: Thank you for allowing [place] to provider your heart failure care after your recent hospitalization. Please follow-up with our [facility] in 2-3 weeks.   At the [facility], you and your health needs are our priority. We have a designated team specialized in the treatment of Heart Failure. This Care Team includes your primary Heart Failure Specialized Cardiologist (physician), Advanced Practice Providers (APPs- Physician Assistants and Nurse Practitioners), and Pharmacist who all work together to provide you with the care you need, when you need it.   You may see any of the following providers on your designated Care Team at your next follow up:  Dr. Glori Bickers Dr. Loralie Champagne Dr. Roxana Hires, NP Lyda Jester, [facility], [place] Forestine Na, NP Audry Riles, PharmD   Please be sure to bring in all your medications bottles to every appointment.   Need to Contact us:  If you have any questions or concerns before your next appointment please send [place] a message through [place] or call our office at [phone].    TO LEAVE A MESSAGE FOR THE NURSE SELECT OPTION 2, PLEASE LEAVE A MESSAGE INCLUDING: YOUR NAME DATE OF BIRTH CALL BACK  NUMBER REASON FOR CALL**this is important as we prioritize the call backs  YOU WILL RECEIVE A CALL BACK THE SAME DAY AS LONG AS YOU CALL BEFORE 4:00 PM

## 2022-03-30 NOTE — Telephone Encounter (Signed)
Called to confirm Heart & Vascular Transitions of Care appointment at 12 noon on 10/2/223. Patient reminded to bring all medications and pill box organizer with them. Confirmed patient has transportation. Gave directions, instructed to utilize [place] parking.  Confirmed appointment prior to ending call.    Earnestine Leys, BSN, Clinical cytogeneticist Only
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
## 2022-04-06 ENCOUNTER — Ambulatory Visit (HOSPITAL_COMMUNITY)
Admission: RE | Admit: 2022-04-06 | Discharge: 2022-04-06 | Disposition: A | Discharge status: Home / Self Care | Payer: Medicare Other | Source: Ambulatory Visit | Attending: Cardiology | Admitting: Cardiology

## 2022-04-06 ENCOUNTER — Telehealth (HOSPITAL_COMMUNITY): Payer: Self-pay | Admitting: *Deleted

## 2022-04-06 DIAGNOSIS — I5022 Chronic systolic (congestive) heart failure: Secondary | ICD-10-CM | POA: Diagnosis present

## 2022-04-06 NOTE — Telephone Encounter (Signed)
Pt aware and appointment scheduled. 

## 2022-04-06 NOTE — Telephone Encounter (Signed)
Patient spoke with Luis [name] and stated he felt like he is drowning and that he has gained 7lbs since Saturday. Pt asked if we can increase medications.   Routed to [place]

## 2022-04-06 NOTE — Telephone Encounter (Signed)
Increase torsemide to 40 mg bid (take extra dose this afternoon) and add KCl 20 daily.  Bring him in for me to see tomorrow at some point, may need admission.  May need [place] lasix.

## 2022-04-07 ENCOUNTER — Ambulatory Visit (HOSPITAL_BASED_OUTPATIENT_CLINIC_OR_DEPARTMENT_OTHER)
Admission: RE | Admit: 2022-04-07 | Discharge: 2022-04-07 | Disposition: A | Discharge status: Home / Self Care | Payer: Medicare Other | Source: Ambulatory Visit | Attending: Cardiology | Admitting: Cardiology

## 2022-04-07 ENCOUNTER — Encounter (HOSPITAL_COMMUNITY): Payer: Self-pay

## 2022-04-07 ENCOUNTER — Encounter (HOSPITAL_COMMUNITY): Payer: Self-pay | Admitting: Cardiology

## 2022-04-07 ENCOUNTER — Emergency Department (HOSPITAL_COMMUNITY): Payer: Medicare Other

## 2022-04-07 ENCOUNTER — Emergency Department (HOSPITAL_COMMUNITY)
Admission: EM | Admit: 2022-04-07 | Discharge: 2022-04-08 | Discharge status: Against Medical Advice | Payer: Medicare Other | Attending: Student | Admitting: Student

## 2022-04-07 ENCOUNTER — Other Ambulatory Visit: Payer: Self-pay

## 2022-04-07 VITALS — BP 110/70 | HR 84 | Wt 298.8 lb

## 2022-04-07 DIAGNOSIS — Z7902 Long term (current) use of antithrombotics/antiplatelets: Secondary | ICD-10-CM | POA: Insufficient documentation

## 2022-04-07 DIAGNOSIS — I428 Other cardiomyopathies: Secondary | ICD-10-CM | POA: Insufficient documentation

## 2022-04-07 DIAGNOSIS — I5023 Acute on chronic systolic (congestive) heart failure: Secondary | ICD-10-CM | POA: Insufficient documentation

## 2022-04-07 DIAGNOSIS — Z79899 Other long term (current) drug therapy: Secondary | ICD-10-CM | POA: Insufficient documentation

## 2022-04-07 DIAGNOSIS — I2510 Atherosclerotic heart disease of native coronary artery without angina pectoris: Secondary | ICD-10-CM | POA: Insufficient documentation

## 2022-04-07 DIAGNOSIS — E119 Type 2 diabetes mellitus without complications: Secondary | ICD-10-CM | POA: Insufficient documentation

## 2022-04-07 DIAGNOSIS — E785 Hyperlipidemia, unspecified: Secondary | ICD-10-CM | POA: Insufficient documentation

## 2022-04-07 DIAGNOSIS — Z1152 Encounter for screening for COVID-19: Secondary | ICD-10-CM | POA: Insufficient documentation

## 2022-04-07 DIAGNOSIS — Z7984 Long term (current) use of oral hypoglycemic drugs: Secondary | ICD-10-CM | POA: Insufficient documentation

## 2022-04-07 DIAGNOSIS — Z955 Presence of coronary angioplasty implant and graft: Secondary | ICD-10-CM | POA: Insufficient documentation

## 2022-04-07 DIAGNOSIS — G4733 Obstructive sleep apnea (adult) (pediatric): Secondary | ICD-10-CM | POA: Insufficient documentation

## 2022-04-07 DIAGNOSIS — I5022 Chronic systolic (congestive) heart failure: Secondary | ICD-10-CM | POA: Diagnosis not present

## 2022-04-07 DIAGNOSIS — R9431 Abnormal electrocardiogram [ECG] [EKG]: Secondary | ICD-10-CM | POA: Insufficient documentation

## 2022-04-07 DIAGNOSIS — Z9981 Dependence on supplemental oxygen: Secondary | ICD-10-CM | POA: Insufficient documentation

## 2022-04-07 DIAGNOSIS — F1411 Cocaine abuse, in remission: Secondary | ICD-10-CM | POA: Insufficient documentation

## 2022-04-07 DIAGNOSIS — Z87891 Personal history of nicotine dependence: Secondary | ICD-10-CM | POA: Insufficient documentation

## 2022-04-07 DIAGNOSIS — Z794 Long term (current) use of insulin: Secondary | ICD-10-CM | POA: Insufficient documentation

## 2022-04-07 DIAGNOSIS — R079 Chest pain, unspecified: Secondary | ICD-10-CM | POA: Insufficient documentation

## 2022-04-07 DIAGNOSIS — I110 Hypertensive heart disease with heart failure: Secondary | ICD-10-CM | POA: Insufficient documentation

## 2022-04-07 DIAGNOSIS — Z5321 Procedure and treatment not carried out due to patient leaving prior to being seen by health care provider: Secondary | ICD-10-CM | POA: Insufficient documentation

## 2022-04-07 DIAGNOSIS — Z95810 Presence of automatic (implantable) cardiac defibrillator: Secondary | ICD-10-CM | POA: Insufficient documentation

## 2022-04-07 DIAGNOSIS — R600 Localized edema: Secondary | ICD-10-CM | POA: Insufficient documentation

## 2022-04-07 DIAGNOSIS — F32A Depression, unspecified: Secondary | ICD-10-CM | POA: Insufficient documentation

## 2022-04-07 DIAGNOSIS — Z7985 Long-term (current) use of injectable non-insulin antidiabetic drugs: Secondary | ICD-10-CM | POA: Insufficient documentation

## 2022-04-07 DIAGNOSIS — Z8249 Family history of ischemic heart disease and other diseases of the circulatory system: Secondary | ICD-10-CM | POA: Insufficient documentation

## 2022-04-07 DIAGNOSIS — Z20822 Contact with and (suspected) exposure to covid-19: Secondary | ICD-10-CM | POA: Insufficient documentation

## 2022-04-07 DIAGNOSIS — R0602 Shortness of breath: Secondary | ICD-10-CM | POA: Insufficient documentation

## 2022-04-07 LAB — CBC WITH DIFFERENTIAL/PLATELET
Abs Immature Granulocytes: 0.03 10*3/uL (ref 0.00–0.07)
Basophils Absolute: 0 10*3/uL (ref 0.0–0.1)
Basophils Relative: 0 %
Eosinophils Absolute: 0.1 10*3/uL (ref 0.0–0.5)
Eosinophils Relative: 2 %
HCT: 44.2 % (ref 39.0–52.0)
Hemoglobin: 14.3 g/dL (ref 13.0–17.0)
Immature Granulocytes: 1 %
Lymphocytes Relative: 23 %
Lymphs Abs: 1.4 10*3/uL (ref 0.7–4.0)
MCH: 28.8 pg (ref 26.0–34.0)
MCHC: 32.4 g/dL (ref 30.0–36.0)
MCV: 89.1 fL (ref 80.0–100.0)
Monocytes Absolute: 1 10*3/uL (ref 0.1–1.0)
Monocytes Relative: 16 %
Neutro Abs: 3.5 10*3/uL (ref 1.7–7.7)
Neutrophils Relative %: 58 %
Platelets: 176 10*3/uL (ref 150–400)
RBC: 4.96 MIL/uL (ref 4.22–5.81)
RDW: 13.5 % (ref 11.5–15.5)
WBC: 6.1 10*3/uL (ref 4.0–10.5)
nRBC: 0 % (ref 0.0–0.2)

## 2022-04-07 LAB — RESP PANEL BY RT-PCR (FLU A&B, COVID) ARPGX2
Influenza A by PCR: NEGATIVE
Influenza B by PCR: NEGATIVE
SARS Coronavirus 2 by RT PCR: NEGATIVE

## 2022-04-07 LAB — BASIC METABOLIC PANEL
Anion gap: 9 (ref 5–15)
BUN: 15 mg/dL (ref 6–20)
CO2: 25 mmol/L (ref 22–32)
Calcium: 8.5 mg/dL — ABNORMAL LOW (ref 8.9–10.3)
Chloride: 101 mmol/L (ref 98–111)
Creatinine, Ser: 1.09 mg/dL (ref 0.61–1.24)
GFR, Estimated: >60 mL/min (ref >60)
Glucose, Bld: 295 mg/dL — ABNORMAL HIGH (ref 70–99)
Potassium: 3.9 mmol/L (ref 3.5–5.1)
Sodium: 135 mmol/L (ref 135–145)

## 2022-04-07 LAB — BRAIN NATRIURETIC PEPTIDE: B Natriuretic Peptide: 180.1 pg/mL — ABNORMAL HIGH (ref 0.0–100.0)

## 2022-04-07 LAB — TROPONIN I (HIGH SENSITIVITY): Troponin I (High Sensitivity): 13 ng/L (ref <18)

## 2022-04-07 MED ORDER — ACETAMINOPHEN 325 MG PO TABS
650.0000 mg | ORAL_TABLET | Freq: Once | ORAL | Status: AC
  Administered 2022-04-07: 650 mg via ORAL
  Filled 2022-04-07: qty 2

## 2022-04-07 NOTE — Progress Notes (Signed)
PCP: Buzzy Han, MD (Inactive) Primary Cardiologist: Dr. Ellyn Hack (previously followed at [facility])  HF Cardiology: Dr. Aundra Dubin  51 y.o. male with h/o chronic systolic heart failure, VT s/p ICD (MDT CRT-D), HTN, HLD, Type 2 DM, OSA on CPAP and h/o substance use (cocaine). He has a reported h/o CAD/PCI in [place] though his cardiomyopathy has been classified as NICM. Unfortunately, I am unable to locate LHC/ angiographic details in Care Everywhere. Echos from 2021-2023 have shown LVEF ranging from 25-35%, RV mildly reduced.  Cardiomyopathy felt likely caused by cocaine use.   He was admitted to [facility] 9/23 for acute on chronic systolic CHF.  ICD was also noted to be at [facility]. Echo showed LVEF 25% w/ global HK, RV mildly reduced. He was diuresed w/ IV Lasix and underwent RHC revealing normal filling pressures and reduced CO w/ CI of 1.82 L/min/m. He was placed on GDMT and transitioned to PO torsemide. Underwent gen change on 9/25 prior to d/c. D/c wt 296 lb. Referred to [facility] clinic at d/c.   Depressed regarding medical conditions but mainly depressed by lack of social/family support. Lives alone. No local relatives/ friends. Wife passed away 3 years ago. Has 57 year old son that lives w/ his older daughter in [place]. His son had to move to [facility] given his inability to care for him. He used to cope by using cocaine but reports he has not used in 3 months. Denies tobacco and ETOH use.   Patient called to clinic yesterday with increasing cough and dyspnea and was worked in today.  He has had a cough (nonproductive) and dyspnea for 4 days.  Prior to this, he says that he was doing ok.  He says that his weight is up 7 lbs at home.  He had a COVID exposure in his home prior to the coughing. He has been wearing 2-3 L home oxygen for 8-9 months.  He has had severe orthopnea for the last couple of nights and has been unable to sleep.  He has been short of breath walking around his house.  He has been  wheezing. He has had constant chest heaviness for 4 days that is likely related to volume overload.  No cocaine, smoking, or ETOH use.   Of note, he is a Restaurant manager, fast food.   ECG (personally reviewed): NSR, BiV paced  Labs (10/23): LDL 61, K 4.5, creatinine 1.09  PMH: 1. Depression 2. VT: MDT CRT-D.  3. HTN 4. Hyperlipidemia 5. Type 2 diabetes 6. OSA: Uses CPAP.  7. Chronic systolic CHF: ?Nonischemic cardiomyopathy.  Has MDT CRT-D device.  Reportedly, CMP was diagnosed in 2012 in [place].  Etiology thought to be cocaine (primarily nonischemic).   - Echo (9/23): EF 25%, mild LV dilation, mildly decreased RV function.  - RHC (9/23): mean RA 8, mean PCWP 13, CI 1.82 8. ?CAD: No cath reports available in Care Everywhere.  ?History of prior PCI per records.   Review of Systems: All systems reviewed and negative except as per HPI.   Current Outpatient Medications  Medication Sig Dispense Refill   carvedilol (COREG) 6.25 MG tablet Take 1 tablet (6.25 mg total) by mouth 2 (two) times daily with a meal. 60 tablet 2   clopidogrel (PLAVIX) 75 MG tablet Take 1 tablet (75 mg total) by mouth daily. 30 tablet 1   dapagliflozin propanediol (FARXIGA) 10 MG TABS tablet Take 1 tablet (10 mg total) by mouth daily. 30 tablet 6   digoxin (LANOXIN) 0.125 MG  tablet Take 1 tablet (0.125 mg total) by mouth daily. 30 tablet 3   DULoxetine (CYMBALTA) 30 MG capsule Take 3 capsules (90 mg total) by mouth daily. 90 capsule 1   hydrOXYzine (ATARAX) 25 MG tablet Take 1 tablet (25 mg total) by mouth 3 (three) times daily as needed for anxiety. 75 tablet 1   Insulin Lispro Prot & Lispro (HUMALOG MIX 75/25 KWIKPEN) (75-25) 100 UNIT/ML Kwikpen Inject 70 Units into the skin 3 (three) times daily.     LANTUS SOLOSTAR 100 UNIT/ML Solostar Pen Inject 50 Units into the skin 2 (two) times daily.     OXYGEN Inhale 2-3 L into the lungs continuous.     pantoprazole (PROTONIX) 40 MG tablet Take 1 tablet (40 mg total) by mouth  daily. 30 tablet 1   prazosin (MINIPRESS) 2 MG capsule Take 1 capsule (2 mg total) by mouth at bedtime. 30 capsule 1   rosuvastatin (CRESTOR) 20 MG tablet Take 1 tablet (20 mg total) by mouth daily. 30 tablet 1   sacubitril-valsartan (ENTRESTO) 49-51 MG Take 1 tablet by mouth 2 (two) times daily. 60 tablet 3   spironolactone (ALDACTONE) 25 MG tablet Take 1 tablet (25 mg total) by mouth daily. 30 tablet 3   terbinafine (LAMISIL) 1 % cream Apply 1 Application topically 2 (two) times daily.     torsemide (DEMADEX) 20 MG tablet Take 2 tablets (40 mg total) by mouth daily. 60 tablet 3   traZODone (DESYREL) 100 MG tablet Take 2 tablets (200 mg total) by mouth at bedtime. 60 tablet 1   TRULICITY 1.5 0000000 SOPN Inject 1.5 mg into the skin once a week. 0.5 mL 4   No current facility-administered medications for this encounter.    Allergies  Allergen Reactions   Aspirin Anaphylaxis and Other (See Comments)    Throat closing    Iodine-131 Hives   Iodinated Contrast Media Hives   Latex Hives and Rash   Penicillins Nausea And Vomiting   Ultram [Tramadol] Hives and Nausea Only   Augmentin [Amoxicillin-Pot Clavulanate] Diarrhea   Lidocaine Rash   Zestril [Lisinopril] Cough      Social History   Socioeconomic History   Marital status: Widowed    Spouse name: Not on file   Number of children: 7   Years of education: Not on file   Highest education level: Bachelor's degree (e.g., BA, AB, BS)  Occupational History   Occupation: disability  Tobacco Use   Smoking status: Former    Packs/day: 0.20    Types: Cigarettes    Quit date: 10/2021    Years since quitting: 0.4   Smokeless tobacco: Never   Tobacco comments:    occasionally  Vaping Use   Vaping Use: Never used  Substance and Sexual Activity   Alcohol use: Not Currently   Drug use: Yes    Types: Cocaine    Comment: last used july   Sexual activity: Not Currently  Other Topics Concern   Not on file  Social History Narrative    Not on file   Social Determinants of Health   Financial Resource Strain: Low Risk  (03/17/2022)   Overall Financial Resource Strain (CARDIA)    Difficulty of Paying Living Expenses: Not hard at all  Food Insecurity: Food Insecurity Present (03/30/2022)   Hunger Vital Sign    Worried About Running Out of Food in the Last Year: Often true    Ran Out of Food in the Last Year: Often true  Transportation  Needs: No Transportation Needs (03/17/2022)   PRAPARE - Hydrologist (Medical): No    Lack of Transportation (Non-Medical): No  Physical Activity: Not on file  Stress: Not on file  Social Connections: Not on file  Intimate Partner Violence: Not on file      Family History  Problem Relation Age of Onset   Hypertension Mother    Bone cancer Father    Lung cancer Father     Vitals:   04/07/22 1420  BP: 110/70  Pulse: 84  SpO2: 98%  Weight: 135.5 kg (298 lb 12.8 oz)    PHYSICAL EXAM: General: NAD, obese Neck: Thick, JVP difficult but appears elevated, no thyromegaly or thyroid nodule.  Lungs: Distant BS with occasional rhonchi.  CV: Nondisplaced PMI.  Heart regular S1/S2, no S3/S4, no murmur.  1+ ankle edema.  No carotid bruit.  Difficult to palpate pedal pulses.  Abdomen: Soft, nontender, no hepatosplenomegaly, no distention.  Skin: Intact without lesions or rashes.  Neurologic: Alert and oriented x 3.  Psych: Normal affect. Extremities: No clubbing or cyanosis.  HEENT: Normal.   ASSESSMENT & PLAN:  1. Cough/dyspnea: Severe for last 4 days.  I suspect that he does have CHF/volume overload.  However, he had a COVID exposure and certainly cough have COVID-19 infection superimposed on CHF.  - I am going to send him to the ER for COVID test and CXR.  2. Acute on chronic systolic CHF:  MDT CRT-D device.  Echoes 2021-2023, LVEF range 25-35%, RV mildly reduced.  Reportedly diagnosed with nonischemic cardiomyopathy in 2012 in [place], etiology felt  due to cocaine abuse.  [place] cardiology 03/07/2019 notes mention patient has CAD with history of angioplasty in the past. Unable to locate cath reports/ details regarding coronary anatomy in record. Most recent echo in 2/23 showed EF 25%, mild LV dilation, mildly decreased RV function. RHC in 9/23 showed low CI at 1.82.  He has been more symptomatic for the last 4 days with dyspnea and orthopnea.  NYHA class IIIb.  Exam is difficult for volume but he says weight is up at home and I suspect he is volume overloaded.  - He will need augmented diuresis.  Would give Lasix 80 mg IV in the ER.  Suspect he will need to be admitted from the ER, in that case would give Lasix 80 mg IV bid.  If he goes home, increase torsemide to 60 mg daily.  - Will need to check BMET - Continue Entresto 49-51 mg bid if creatinine stable.  - Continue Farxiga 10 mg daily - Continue Spironolactone 25 mg daily   - Continue Coreg 6.25 mg bid  - Continue Digoxin 0.125 mg daily, will need to check level.  2. CAD: Documented in Care Everywhere, reported h/o angioplasty, details unknown (cannot find a cath report though prior CAD is referenced).  He reports constant chest heaviness for 4 days since coughing began.  I suspect that this pain is not ischemic and is related to coughing/volume overload.  - continue Plavix 75 mg daily  - Continue Crestor 20 mg daily.  - Would check HS-TnI to rule out evidence for ACS (unlikely).  - I will eventually do coronary angiography to rule out CAD as cause of his cardiomyopathy 3. H/o VT: Has CRT-D w/ chronic systolic heart failure and QRS > 150 ms  - followed by [facility] device clinic  4. HTN: BP controlled.  5. Type 2DM - continue Insulin per PCP  -  now on Farxiga  6. Cocaine: Says he has quit x 8-9 months.   7. Depression: Depressed due to lack of social/family support and food insecurity  8. Jehovah's Witness.   I am going to send him to the ER for COVID testing, CXR, and a dose of IV Lasix.  I  suspect that he will end up needing admission on the hospitalist service.  I will follow in consultation for cardiology.   Loralie Champagne 04/07/2022 3:02 PM

## 2022-04-07 NOTE — ED Provider Triage Note (Signed)
Emergency Medicine Provider Triage Evaluation Note  Luis [name] , a 51 y.o. male  was evaluated in triage.  Pt complains of 9 pound weight gain over the past 3 days associated with lower extremity edema and shortness of breath.  He also admits to chest pain which she describes as a "elephant sitting on his chest".  Patient also had a positive COVID exposure.  Patient sent by heart failure clinic for diuresis.  Review of Systems  Positive: SOB Negative: fever  Physical Exam  BP (!) 144/81 (BP Location: Right Arm)   Pulse 84   Temp 99.1 F (37.3 C) (Oral)   Resp 20   Ht 5\' 6"  (1.676 m)   Wt 135.2 kg   SpO2 100%   BMI 48.10 kg/m  Gen:   Awake, no distress   Resp:  Normal effort  MSK:   Moves extremities without difficulty  Other:  2+ pitting edema bilaterally  Medical Decision Making  Medically screening exam initiated at 3:56 PM.  Appropriate orders placed.  Luis [name] was informed that the remainder of the evaluation will be completed by another provider, this initial triage assessment does not replace that evaluation, and the importance of remaining in the ED until their evaluation is complete.  Labs CXR EKG   Suzy Bouchard, [place] 04/07/22 1557

## 2022-04-07 NOTE — ED Notes (Signed)
Oxygen tank replaced °

## 2022-04-07 NOTE — ED Notes (Signed)
Patient states he doesn't want oxygen anymore

## 2022-04-07 NOTE — ED Triage Notes (Signed)
Sent by cardiologist from heart failure clinic due to weight gain and fluid retentinon.  Also reports was exposed to covid by home health aid and needs testing.  Reports chest pain.

## 2022-04-08 ENCOUNTER — Telehealth (HOSPITAL_COMMUNITY): Payer: Self-pay | Admitting: Cardiology

## 2022-04-08 MED ORDER — TORSEMIDE 20 MG PO TABS: 60.0000 mg | ORAL_TABLET | Freq: Every day | ORAL | 3 refills | Status: DC

## 2022-04-08 NOTE — Progress Notes (Deleted)
Electrophysiology Office Note Date: 04/08/2022  ID:  ARRAN MONACO, DOB 24-Oct-1970, MRN [id number]  PCP: Buzzy Han, MD (Inactive) Primary Cardiologist: Glenetta Hew, MD Electrophysiologist: Melida Quitter, MD   CC: Routine ICD follow-up  Luis [name] is a 51 y.o. male seen today for Melida Quitter, MD for post hospital follow up.    Admitted ***  Device noted to be at [facility] and underwent gen change 03/23/2022.  Since discharge from hospital the patient reports doing ***.  he denies chest pain, palpitations, dyspnea, PND, orthopnea, nausea, vomiting, dizziness, syncope, edema, weight gain, or early satiety.    He has not had ICD shocks.   Device History: Medtronic BiV ICD implanted ***, gen change 03/23/2022 for CHF  Past Medical History:  Diagnosis Date   Anxiety    Biventricular ICD (implantable cardioverter-defibrillator) in place    Chronic systolic CHF (congestive heart failure) ([place])    Cocaine use    Diabetes mellitus without complication (HCC)    GERD (gastroesophageal reflux disease)    HLD (hyperlipidemia)    Hypertension    Morbid obesity (HCC)    NICM (nonischemic cardiomyopathy) ([place])    OSA (obstructive sleep apnea)    PTSD (post-traumatic stress disorder)    on Depakote   Refusal of blood transfusions as patient is Jehovah's Witness    Tobacco abuse    Past Surgical History:  Procedure Laterality Date   BIV ICD INSERTION CRT-D     FOOT FRACTURE SURGERY     ICD GENERATOR CHANGEOUT N/A 03/23/2022   Procedure: ICD GENERATOR [place];  Surgeon: Myles Gip, Yetta Barre, MD;  Location: [place] [facility];  Service: Cardiovascular;  Laterality: N/A;   RIGHT HEART CATH N/A 03/06/2022   Procedure: RIGHT HEART CATH;  Surgeon: Early Osmond, MD;  Location: [place] [facility];  Service: Cardiovascular;  Laterality: N/A;   ROTATOR CUFF REPAIR     TONSILLECTOMY      Current Outpatient Medications  Medication Sig Dispense Refill    carvedilol (COREG) 6.25 MG tablet Take 1 tablet (6.25 mg total) by mouth 2 (two) times daily with a meal. 60 tablet 2   clopidogrel (PLAVIX) 75 MG tablet Take 1 tablet (75 mg total) by mouth daily. 30 tablet 1   dapagliflozin propanediol (FARXIGA) 10 MG TABS tablet Take 1 tablet (10 mg total) by mouth daily. 30 tablet 6   digoxin (LANOXIN) 0.125 MG tablet Take 1 tablet (0.125 mg total) by mouth daily. 30 tablet 3   DULoxetine (CYMBALTA) 30 MG capsule Take 3 capsules (90 mg total) by mouth daily. 90 capsule 1   hydrOXYzine (ATARAX) 25 MG tablet Take 1 tablet (25 mg total) by mouth 3 (three) times daily as needed for anxiety. 75 tablet 1   Insulin Lispro Prot & Lispro (HUMALOG MIX 75/25 KWIKPEN) (75-25) 100 UNIT/ML Kwikpen Inject 70 Units into the skin 3 (three) times daily.     LANTUS SOLOSTAR 100 UNIT/ML Solostar Pen Inject 50 Units into the skin 2 (two) times daily.     OXYGEN Inhale 2-3 L into the lungs continuous.     pantoprazole (PROTONIX) 40 MG tablet Take 1 tablet (40 mg total) by mouth daily. 30 tablet 1   prazosin (MINIPRESS) 2 MG capsule Take 1 capsule (2 mg total) by mouth at bedtime. 30 capsule 1   rosuvastatin (CRESTOR) 20 MG tablet Take 1 tablet (20 mg total) by mouth daily. 30 tablet 1   sacubitril-valsartan (ENTRESTO) 49-51 MG Take 1  tablet by mouth 2 (two) times daily. 60 tablet 3   spironolactone (ALDACTONE) 25 MG tablet Take 1 tablet (25 mg total) by mouth daily. 30 tablet 3   terbinafine (LAMISIL) 1 % cream Apply 1 Application topically 2 (two) times daily.     torsemide (DEMADEX) 20 MG tablet Take 2 tablets (40 mg total) by mouth daily. 60 tablet 3   traZODone (DESYREL) 100 MG tablet Take 2 tablets (200 mg total) by mouth at bedtime. 60 tablet 1   TRULICITY 1.5 0000000 SOPN Inject 1.5 mg into the skin once a week. 0.5 mL 4   No current facility-administered medications for this visit.    Allergies:   Aspirin, Iodine-131, Iodinated contrast media, Latex, Penicillins,  Ultram [tramadol], Augmentin [amoxicillin-pot clavulanate], Lidocaine, and Zestril [lisinopril]   Social History: Social History   Socioeconomic History   Marital status: Widowed    Spouse name: Not on file   Number of children: 7   Years of education: Not on file   Highest education level: Bachelor's degree (e.g., BA, AB, BS)  Occupational History   Occupation: disability  Tobacco Use   Smoking status: Former    Packs/day: 0.20    Types: Cigarettes    Quit date: 10/2021    Years since quitting: 0.4   Smokeless tobacco: Never   Tobacco comments:    occasionally  Vaping Use   Vaping Use: Never used  Substance and Sexual Activity   Alcohol use: Not Currently   Drug use: Yes    Types: Cocaine    Comment: last used july   Sexual activity: Not Currently  Other Topics Concern   Not on file  Social History Narrative   Not on file   Social Determinants of Health   Financial Resource Strain: Low Risk  (03/17/2022)   Overall Financial Resource Strain (CARDIA)    Difficulty of Paying Living Expenses: Not hard at all  Food Insecurity: Food Insecurity Present (03/30/2022)   Hunger Vital Sign    Worried About Running Out of Food in the Last Year: Often true    Ran Out of Food in the Last Year: Often true  Transportation Needs: No Transportation Needs (03/17/2022)   PRAPARE - Hydrologist (Medical): No    Lack of Transportation (Non-Medical): No  Physical Activity: Not on file  Stress: Not on file  Social Connections: Not on file  Intimate Partner Violence: Not on file    Family History: Family History  Problem Relation Age of Onset   Hypertension Mother    Bone cancer Father    Lung cancer Father     Review of Systems: All other systems reviewed and are otherwise negative except as noted above.   Physical Exam: There were no vitals filed for this visit.   GEN- The patient is well appearing, alert and oriented x 3 today.   HEENT:  normocephalic, atraumatic; sclera clear, conjunctiva pink; hearing intact; oropharynx clear; neck supple, no JVP Lymph- no cervical lymphadenopathy Lungs- Clear to ausculation bilaterally, normal work of breathing.  No wheezes, rales, rhonchi Heart- {Blank single:19197::"Regular","Irregularly irregular"}  rate and rhythm, no murmurs, rubs or gallops, PMI not laterally displaced GI- soft, non-tender, non-distended, bowel sounds present, no hepatosplenomegaly Extremities- no clubbing or cyanosis. {EDEMA BY:630183 peripheral edema; DP/PT/radial pulses 2+ bilaterally MS- no significant deformity or atrophy Skin- warm and dry, no rash or lesion; ICD pocket well healed Psych- euthymic mood, full affect Neuro- strength and sensation are intact  ICD interrogation- reviewed in detail today,  See PACEART report  EKG:  EKG {ACTION; IS/IS [id number]} ordered today. Personal review of EKG ordered {Blank single:19197::"today","***"} shows ***  Recent Labs: 03/01/2022: TSH 0.117 03/25/2022: Magnesium 2.2 03/30/2022: ALT 22 04/07/2022: B Natriuretic Peptide 180.1; BUN 15; Creatinine, Ser 1.09; Hemoglobin 14.3; Platelets 176; Potassium 3.9; Sodium 135   Wt Readings from Last 3 Encounters:  04/07/22 298 lb (135.2 kg)  04/07/22 298 lb 12.8 oz (135.5 kg)  03/30/22 (!) 300 lb 12.8 oz (136.4 kg)     Other studies Reviewed: Additional studies/ records that were reviewed today include: Previous EP office notes.   Assessment and Plan:  1.  Chronic systolic dysfunction s/p Medtronic CRT-D  euvolemic today Stable on an appropriate medical regimen Normal ICD function s/p gen change 03/23/2022  See Pace Art report No changes today  2. Chronic hypoxic respiratory failure On O2 at home.  Current medicines are reviewed at length with the patient today.   =  Labs/ tests ordered today include: *** No orders of the defined types were placed in this encounter.    Disposition:  *** Follow up with  {EPMDS:28135} {Blank single:19197::"in 2 weeks","in 4 weeks","in 3 months","in 6 months","in 12 months","as usual post gen change"}    Signed, Shirley Friar, PA-C  04/08/2022 9:21 AM  [facility] [address] [place] [address] [phone] (office) [phone] (fax)

## 2022-04-08 NOTE — Telephone Encounter (Signed)
-----   Message from Larey Dresser, MD sent at 04/08/2022  6:34 AM EDT ----- We sent him to the ER yesterday, had negative COVID test but was never actually seen and left after about 8 hours unfortunately.  Please call him and make sure he increases torsemide to 60 mg daily.  Work him in to see me next week and will likely need to get set up for right/left heart cath at that time.

## 2022-04-08 NOTE — Telephone Encounter (Signed)
Patient advised and verbalized understanding. Med list updated to reflect changes, appt scheduled.   Meds ordered this encounter  Medications   torsemide (DEMADEX) 20 MG tablet    Sig: Take 3 tablets (60 mg total) by mouth daily.    Dispense:  90 tablet    Refill:  3    Please cancel all previous orders for current medication. Change in dosage or pill size.

## 2022-04-08 NOTE — Telephone Encounter (Signed)
Pt was sent to ER yesterday after appt, Pt set in ER from 2:24pm to 1:15am, was not seen by medical staff, so pt left, went thru 2 tanks of oxygen. Pt need clinical staff to return call, left message on vm, pt #[phone]

## 2022-04-08 NOTE — ED Notes (Signed)
Patient left.

## 2022-04-09 ENCOUNTER — Ambulatory Visit: Payer: Medicare Other | Attending: Student | Admitting: Student

## 2022-04-09 DIAGNOSIS — I5022 Chronic systolic (congestive) heart failure: Secondary | ICD-10-CM

## 2022-04-09 DIAGNOSIS — J9611 Chronic respiratory failure with hypoxia: Secondary | ICD-10-CM

## 2022-04-10 ENCOUNTER — Telehealth (HOSPITAL_COMMUNITY): Payer: Self-pay | Admitting: *Deleted

## 2022-04-10 NOTE — Telephone Encounter (Signed)
Pt called stating his legs are so swollen he cant walk. He cant lay down because he feels like he is drowning. Pt said he is coughing up black and red mucus. Pt said he does not want to go to the emergency room and wants a call from a provider.  Dr.McLean is out of the office msg routed to [facility]

## 2022-04-10 NOTE — Telephone Encounter (Signed)
Pt agreeable with going to the emergency room for evaluation.

## 2022-04-14 ENCOUNTER — Other Ambulatory Visit (HOSPITAL_COMMUNITY): Payer: Self-pay

## 2022-04-14 ENCOUNTER — Ambulatory Visit (HOSPITAL_BASED_OUTPATIENT_CLINIC_OR_DEPARTMENT_OTHER)
Admission: RE | Admit: 2022-04-14 | Discharge: 2022-04-14 | Disposition: A | Discharge status: Home / Self Care | Payer: Medicare Other | Source: Ambulatory Visit | Attending: Cardiology | Admitting: Cardiology

## 2022-04-14 VITALS — BP 130/80 | HR 81 | Wt 320.0 lb

## 2022-04-14 DIAGNOSIS — I5022 Chronic systolic (congestive) heart failure: Secondary | ICD-10-CM

## 2022-04-14 DIAGNOSIS — I5023 Acute on chronic systolic (congestive) heart failure: Secondary | ICD-10-CM | POA: Diagnosis not present

## 2022-04-14 DIAGNOSIS — Z1152 Encounter for screening for COVID-19: Secondary | ICD-10-CM | POA: Diagnosis not present

## 2022-04-14 DIAGNOSIS — I82621 Acute embolism and thrombosis of deep veins of right upper extremity: Secondary | ICD-10-CM | POA: Diagnosis not present

## 2022-04-14 DIAGNOSIS — I110 Hypertensive heart disease with heart failure: Secondary | ICD-10-CM | POA: Diagnosis not present

## 2022-04-14 LAB — COMPREHENSIVE METABOLIC PANEL
ALT: 17 U/L (ref 0–44)
AST: 24 U/L (ref 15–41)
Albumin: 3.1 g/dL — ABNORMAL LOW (ref 3.5–5.0)
Alkaline Phosphatase: 42 U/L (ref 38–126)
Anion gap: 6 (ref 5–15)
BUN: 9 mg/dL (ref 6–20)
CO2: 29 mmol/L (ref 22–32)
Calcium: 8.5 mg/dL — ABNORMAL LOW (ref 8.9–10.3)
Chloride: 102 mmol/L (ref 98–111)
Creatinine, Ser: 1.15 mg/dL (ref 0.61–1.24)
GFR, Estimated: >60 mL/min (ref >60)
Glucose, Bld: 168 mg/dL — ABNORMAL HIGH (ref 70–99)
Potassium: 4.3 mmol/L (ref 3.5–5.1)
Sodium: 137 mmol/L (ref 135–145)
Total Bilirubin: 0.2 mg/dL — ABNORMAL LOW (ref 0.3–1.2)
Total Protein: 5.4 g/dL — ABNORMAL LOW (ref 6.5–8.1)

## 2022-04-14 LAB — CBC
HCT: 43.8 % (ref 39.0–52.0)
Hemoglobin: 13.8 g/dL (ref 13.0–17.0)
MCH: 28.8 pg (ref 26.0–34.0)
MCHC: 31.5 g/dL (ref 30.0–36.0)
MCV: 91.4 fL (ref 80.0–100.0)
Platelets: 238 10*3/uL (ref 150–400)
RBC: 4.79 MIL/uL (ref 4.22–5.81)
RDW: 13.8 % (ref 11.5–15.5)
WBC: 10.5 10*3/uL (ref 4.0–10.5)
nRBC: 0 % (ref 0.0–0.2)

## 2022-04-14 LAB — DIGOXIN LEVEL: Digoxin Level: <0.2 ng/mL — ABNORMAL LOW (ref 0.8–2.0)

## 2022-04-14 LAB — BRAIN NATRIURETIC PEPTIDE: B Natriuretic Peptide: 161.9 pg/mL — ABNORMAL HIGH (ref 0.0–100.0)

## 2022-04-14 MED ORDER — POTASSIUM CHLORIDE CRYS ER 20 MEQ PO TBCR: 20.0000 meq | EXTENDED_RELEASE_TABLET | Freq: Every day | ORAL | 3 refills | Status: DC

## 2022-04-14 MED ORDER — METOLAZONE 2.5 MG PO TABS: 2.5000 mg | ORAL_TABLET | ORAL | 1 refills | Status: DC

## 2022-04-14 MED ORDER — TORSEMIDE 20 MG PO TABS: 80.0000 mg | ORAL_TABLET | Freq: Every day | ORAL | 3 refills | Status: DC

## 2022-04-14 NOTE — Patient Instructions (Signed)
Increase Torsemide to 80mg  daily.  Start Potassium 50meq (1 Tab) daily.  Take 1 Metolazone tomorrow morning before your Torsemide   Take Robitussin DM as needed for you cough. You can buy it over the counter  You are scheduled for a Cardiac Catheterization on Thursday, October 19 with Dr. Loralie Champagne.  1. Please arrive at the [facility] (Main Entrance A) at [facility]: [address] [place], [place] [address] at 11:00 AM (This time is two hours before your procedure to ensure your preparation). Free valet parking service is available.   Special note: Every effort is made to have your procedure done on time. Please understand that emergencies sometimes delay scheduled procedures.  2. Diet: Do not eat solid foods after midnight.  The patient may have clear liquids until 5am upon the day of the procedure.  3.  Medication instructions in preparation for your procedure:   Contrast Allergy: No  Stop taking, spironolactone, Torsemide & Farxiga  Thursday, October 19,  Take only 50 units of insulin the night before your procedure. Do not take any insulin on the day of the procedure.  On the morning of your procedure, take your Plavix/Clopidogrel and any morning medicines NOT listed above.  You may use sips of water.  5. Plan for one night stay--bring personal belongings. 6. Bring a current list of your medications and current insurance cards. 7. You MUST have a responsible person to drive you home. 8. Someone MUST be with you the first 24 hours after you arrive home or your discharge will be delayed. 9. Please wear clothes that are easy to get on and off and wear slip-on shoes.  If you have any questions or concerns before your next appointment please send [place] a message through [place] or call our office at [phone].    TO LEAVE A MESSAGE FOR THE NURSE SELECT OPTION 2, PLEASE LEAVE A MESSAGE INCLUDING: YOUR NAME DATE OF BIRTH CALL BACK NUMBER REASON FOR CALL**this is  important as we prioritize the call backs  YOU WILL RECEIVE A CALL BACK THE SAME DAY AS LONG AS YOU CALL BEFORE 4:00 PM  At the [facility], you and your health needs are our priority. As part of our continuing mission to provide you with exceptional heart care, we have created designated Provider Care Teams. These Care Teams include your primary Cardiologist (physician) and Advanced Practice Providers (APPs- Physician Assistants and Nurse Practitioners) who all work together to provide you with the care you need, when you need it.   You may see any of the following providers on your designated Care Team at your next follow up: Dr Glori Bickers Dr Loralie Champagne Dr. Roxana Hires, NP Lyda Jester, [place] [facility] [place], [place] Forestine Na, NP Audry Riles, PharmD   Please be sure to bring in all your medications bottles to every appointment.

## 2022-04-15 NOTE — Progress Notes (Signed)
PCP: Buzzy Han, MD (Inactive) Primary Cardiologist: Dr. Ellyn Hack (previously followed at [facility])  HF Cardiology: Dr. Aundra Dubin  51 y.o. male with h/o chronic systolic heart failure, VT s/p ICD (MDT CRT-D), HTN, HLD, Type 2 DM, OSA on CPAP and h/o substance use (cocaine). He has a reported h/o CAD/PCI in [place] though his cardiomyopathy has been classified as NICM. Unfortunately, I am unable to locate LHC/ angiographic details in Care Everywhere. Echos from 2021-2023 have shown LVEF ranging from 25-35%, RV mildly reduced.  Cardiomyopathy felt likely caused by cocaine use.   He was admitted to [facility] 9/23 for acute on chronic systolic CHF.  ICD was also noted to be at [facility]. Echo showed LVEF 25% w/ global HK, RV mildly reduced. He was diuresed w/ IV Lasix and underwent RHC revealing normal filling pressures and reduced CO w/ CI of 1.82 L/min/m. He was placed on GDMT and transitioned to PO torsemide. Underwent gen change on 9/25 prior to d/c. D/c wt 296 lb. Referred to [facility] clinic at d/c.   Depressed regarding medical conditions but mainly depressed by lack of social/family support. Lives alone. No local relatives/ friends. Wife passed away 3 years ago. Has 3 year old son that lives w/ his older daughter in [place]. His son had to move to [facility] given his inability to care for him. He used to cope by using cocaine but reports he has not used in > 3 months. Denies tobacco and ETOH use.   Patient returns for followup of CHF. At appointment last week, he was very dyspneic and coughing.  I sent him to the ER for evaluation and admission, but the wait was too long and he went home. He did have a CXR that was clear and a negative COVID-19 test.  Since getting home, he reports a 12 lb weight gain.  He walks very little and is dyspneic just walking around his house.  He is still coughing, sometimes coughs so much that he gets dizzy.  Poor appetite with early satiety.  He is unable to lie flat.  He has a  hard time sleeping.  He has a lot of itching and redness under his pannus.  He uses 2L home oxygen and CPAP at night. He continues to have atypical, nonexertional chest pain episodes.   Of note, he is a Restaurant manager, fast food.   ECG (personally reviewed): NSR, BiV paced  Labs (10/23): LDL 61, K 4.5, creatinine 1.09, BNP 180, hs-TnI 13  PMH: 1. Depression 2. VT: MDT CRT-D.  3. HTN 4. Hyperlipidemia 5. Type 2 diabetes 6. OSA: Uses CPAP.  7. Chronic systolic CHF: ?Nonischemic cardiomyopathy.  Has MDT CRT-D device.  Reportedly, CMP was diagnosed in 2012 in [place].  Etiology thought to be cocaine (primarily nonischemic).   - Echo (9/23): EF 25%, mild LV dilation, mildly decreased RV function.  - RHC (9/23): mean RA 8, mean PCWP 13, CI 1.82 8. ?CAD: No cath reports available in Care Everywhere.  ?History of prior PCI per records.   Review of Systems: All systems reviewed and negative except as per HPI.   Current Outpatient Medications  Medication Sig Dispense Refill   carvedilol (COREG) 6.25 MG tablet Take 1 tablet (6.25 mg total) by mouth 2 (two) times daily with a meal. 60 tablet 2   clopidogrel (PLAVIX) 75 MG tablet Take 1 tablet (75 mg total) by mouth daily. 30 tablet 1   dapagliflozin propanediol (FARXIGA) 10 MG TABS tablet Take 1 tablet (10 mg total)  by mouth daily. 30 tablet 6   digoxin (LANOXIN) 0.125 MG tablet Take 1 tablet (0.125 mg total) by mouth daily. 30 tablet 3   DULoxetine (CYMBALTA) 30 MG capsule Take 3 capsules (90 mg total) by mouth daily. 90 capsule 1   hydrOXYzine (ATARAX) 25 MG tablet Take 1 tablet (25 mg total) by mouth 3 (three) times daily as needed for anxiety. 75 tablet 1   Insulin Lispro Prot & Lispro (HUMALOG MIX 75/25 KWIKPEN) (75-25) 100 UNIT/ML Kwikpen Inject 70 Units into the skin 3 (three) times daily.     LANTUS SOLOSTAR 100 UNIT/ML Solostar Pen Inject 50 Units into the skin 2 (two) times daily.     metolazone (ZAROXOLYN) 2.5 MG tablet Take 1 tablet (2.5  mg total) by mouth as directed. 4 tablet 1   OXYGEN Inhale 2-3 L into the lungs continuous.     pantoprazole (PROTONIX) 40 MG tablet Take 1 tablet (40 mg total) by mouth daily. 30 tablet 1   potassium chloride SA (KLOR-CON M) 20 MEQ tablet Take 1 tablet (20 mEq total) by mouth daily. 90 tablet 3   prazosin (MINIPRESS) 2 MG capsule Take 1 capsule (2 mg total) by mouth at bedtime. 30 capsule 1   rosuvastatin (CRESTOR) 20 MG tablet Take 1 tablet (20 mg total) by mouth daily. 30 tablet 1   sacubitril-valsartan (ENTRESTO) 49-51 MG Take 1 tablet by mouth 2 (two) times daily. 60 tablet 3   spironolactone (ALDACTONE) 25 MG tablet Take 1 tablet (25 mg total) by mouth daily. 30 tablet 3   terbinafine (LAMISIL) 1 % cream Apply 1 Application topically 2 (two) times daily.     traZODone (DESYREL) 100 MG tablet Take 2 tablets (200 mg total) by mouth at bedtime. 60 tablet 1   TRULICITY 1.5 0000000 SOPN Inject 1.5 mg into the skin once a week. 0.5 mL 4   torsemide (DEMADEX) 20 MG tablet Take 4 tablets (80 mg total) by mouth daily. 90 tablet 3   No current facility-administered medications for this encounter.    Allergies  Allergen Reactions   Aspirin Anaphylaxis and Other (See Comments)    Throat closing    Iodine-131 Hives   Iodinated Contrast Media Hives    Can be premedicated    Latex Hives and Rash   Penicillins Nausea And Vomiting   Ultram [Tramadol] Hives and Nausea Only   Augmentin [Amoxicillin-Pot Clavulanate] Diarrhea   Lidocaine Rash   Zestril [Lisinopril] Cough      Social History   Socioeconomic History   Marital status: Widowed    Spouse name: Not on file   Number of children: 7   Years of education: Not on file   Highest education level: Bachelor's degree (e.g., BA, AB, BS)  Occupational History   Occupation: disability  Tobacco Use   Smoking status: Former    Packs/day: 0.20    Types: Cigarettes    Quit date: 10/2021    Years since quitting: 0.4   Smokeless tobacco:  Never   Tobacco comments:    occasionally  Vaping Use   Vaping Use: Never used  Substance and Sexual Activity   Alcohol use: Not Currently   Drug use: Yes    Types: Cocaine    Comment: last used july   Sexual activity: Not Currently  Other Topics Concern   Not on file  Social History Narrative   Not on file   Social Determinants of Health   Financial Resource Strain: Low Risk  (03/17/2022)  Overall Financial Resource Strain (CARDIA)    Difficulty of Paying Living Expenses: Not hard at all  Food Insecurity: Food Insecurity Present (03/30/2022)   Hunger Vital Sign    Worried About Running Out of Food in the Last Year: Often true    Ran Out of Food in the Last Year: Often true  Transportation Needs: No Transportation Needs (03/17/2022)   PRAPARE - Hydrologist (Medical): No    Lack of Transportation (Non-Medical): No  Physical Activity: Not on file  Stress: Not on file  Social Connections: Not on file  Intimate Partner Violence: Not on file      Family History  Problem Relation Age of Onset   Hypertension Mother    Bone cancer Father    Lung cancer Father     Vitals:   04/14/22 1545  BP: 130/80  Pulse: 81  SpO2: 96%  Weight: (!) 145.2 kg (320 lb)    PHYSICAL EXAM: General: NAD, obese Neck: Thick, JVP difficult, no thyromegaly or thyroid nodule.  Lungs: Clear to auscultation bilaterally with normal respiratory effort. CV: Nondisplaced PMI.  Heart regular S1/S2, no S3/S4, no murmur.  1+ ankle edema.  No carotid bruit.  Unable to palpate pedal pulses.  Abdomen: Soft, nontender, no hepatosplenomegaly, no distention.  Skin: Intact without lesions or rashes.  Neurologic: Alert and oriented x 3.  Psych: Normal affect. Extremities: No clubbing or cyanosis.  HEENT: Normal.   ASSESSMENT & PLAN:  1. Chronic systolic CHF:  MDT CRT-D device.  Echoes 2021-2023, LVEF range 25-35%, RV mildly reduced.  Reportedly diagnosed with nonischemic  cardiomyopathy in 2012 in [place], etiology felt due to cocaine abuse.  [facility] 03/07/2019 notes mention patient has CAD with history of angioplasty in the past. Unable to locate cath reports/ details regarding coronary anatomy in record. Most recent echo in 2/23 showed EF 25%, mild LV dilation, mildly decreased RV function. RHC in 9/23 showed low CI at 1.82.  Ongoing NYHA class IIIb.  Exam is difficult for volume but he says weight is up at home and I suspect he is volume overloaded.  I tried to admit him last week but hospital was full and he would not wait in the ER.   - Increase torsemide to 80 mg daily and will give him 1 dose of metolazone 2.5 mg with tomorrow's torsemide. BMET/BNP today, BMET 10 days.   - Continue Entresto 49-51 mg bid.   - Continue Farxiga 10 mg daily - Continue Spironolactone 25 mg daily   - Continue Coreg 6.25 mg bid  - Continue Digoxin 0.125 mg daily, check level.  - I am going to arrange for RHC/LHC this week. I will assess for coronary disease as cause of cardiomyopathy and also assess filling pressures/cardiac output.  There is a good chance that I will admit him at this time. However, if symptoms are more due to OHS/OSA, increased diuresis may not be helpful.  We discussed risks benefits and he agrees to procedure.  - I suspect that with OHS/OSA, obesity, and his poor mobility that he will be a poor candidate for LVAD/transplant.  2. CAD: Documented in Care Everywhere, reported h/o angioplasty, details unknown (cannot find a cath report though prior CAD is referenced).  He has had atypical chest pain.   - continue Plavix 75 mg daily  - Continue Crestor 20 mg daily.  - As above, I will do coronary angiography to rule out CAD as cause of his cardiomyopathy  3. H/o VT: Has CRT-D w/ chronic systolic heart failure and QRS > 150 ms  - followed by [facility] device clinic  4. HTN: BP controlled.  5. Type 2DM - continue Insulin per PCP  - now on Farxiga  6. Cocaine:  Says he has quit for months now.    7. Depression: Depressed due to lack of social/family support and food insecurity  8. Jehovah's Witness.  9. OHS/OSA: He uses oxygen during the day and OSA at night.  This likely plays a role in his dyspnea.  10. Yeast infection under pannus: He will use Nystatin.   LHC/RHC arranged, may need to admit afterwards depending on filling pressures.   Loralie Champagne 04/15/2022

## 2022-04-15 NOTE — H&P (View-Only) (Signed)
PCP: Buzzy Han, MD (Inactive) Primary Cardiologist: Dr. Ellyn Hack (previously followed at [facility])  HF Cardiology: Dr. Aundra Dubin  51 y.o. male with h/o chronic systolic heart failure, VT s/p ICD (MDT CRT-D), HTN, HLD, Type 2 DM, OSA on CPAP and h/o substance use (cocaine). He has a reported h/o CAD/PCI in [place] though his cardiomyopathy has been classified as NICM. Unfortunately, I am unable to locate LHC/ angiographic details in Care Everywhere. Echos from 2021-2023 have shown LVEF ranging from 25-35%, RV mildly reduced.  Cardiomyopathy felt likely caused by cocaine use.   He was admitted to [facility] 9/23 for acute on chronic systolic CHF.  ICD was also noted to be at [facility]. Echo showed LVEF 25% w/ global HK, RV mildly reduced. He was diuresed w/ IV Lasix and underwent RHC revealing normal filling pressures and reduced CO w/ CI of 1.82 L/min/m. He was placed on GDMT and transitioned to PO torsemide. Underwent gen change on 9/25 prior to d/c. D/c wt 296 lb. Referred to [facility] clinic at d/c.   Depressed regarding medical conditions but mainly depressed by lack of social/family support. Lives alone. No local relatives/ friends. Wife passed away 3 years ago. Has 3 year old son that lives w/ his older daughter in [place]. His son had to move to [facility] given his inability to care for him. He used to cope by using cocaine but reports he has not used in > 3 months. Denies tobacco and ETOH use.   Patient returns for followup of CHF. At appointment last week, he was very dyspneic and coughing.  I sent him to the ER for evaluation and admission, but the wait was too long and he went home. He did have a CXR that was clear and a negative COVID-19 test.  Since getting home, he reports a 12 lb weight gain.  He walks very little and is dyspneic just walking around his house.  He is still coughing, sometimes coughs so much that he gets dizzy.  Poor appetite with early satiety.  He is unable to lie flat.  He has a  hard time sleeping.  He has a lot of itching and redness under his pannus.  He uses 2L home oxygen and CPAP at night. He continues to have atypical, nonexertional chest pain episodes.   Of note, he is a Restaurant manager, fast food.   ECG (personally reviewed): NSR, BiV paced  Labs (10/23): LDL 61, K 4.5, creatinine 1.09, BNP 180, hs-TnI 13  PMH: 1. Depression 2. VT: MDT CRT-D.  3. HTN 4. Hyperlipidemia 5. Type 2 diabetes 6. OSA: Uses CPAP.  7. Chronic systolic CHF: ?Nonischemic cardiomyopathy.  Has MDT CRT-D device.  Reportedly, CMP was diagnosed in 2012 in [place].  Etiology thought to be cocaine (primarily nonischemic).   - Echo (9/23): EF 25%, mild LV dilation, mildly decreased RV function.  - RHC (9/23): mean RA 8, mean PCWP 13, CI 1.82 8. ?CAD: No cath reports available in Care Everywhere.  ?History of prior PCI per records.   Review of Systems: All systems reviewed and negative except as per HPI.   Current Outpatient Medications  Medication Sig Dispense Refill   carvedilol (COREG) 6.25 MG tablet Take 1 tablet (6.25 mg total) by mouth 2 (two) times daily with a meal. 60 tablet 2   clopidogrel (PLAVIX) 75 MG tablet Take 1 tablet (75 mg total) by mouth daily. 30 tablet 1   dapagliflozin propanediol (FARXIGA) 10 MG TABS tablet Take 1 tablet (10 mg total)  by mouth daily. 30 tablet 6   digoxin (LANOXIN) 0.125 MG tablet Take 1 tablet (0.125 mg total) by mouth daily. 30 tablet 3   DULoxetine (CYMBALTA) 30 MG capsule Take 3 capsules (90 mg total) by mouth daily. 90 capsule 1   hydrOXYzine (ATARAX) 25 MG tablet Take 1 tablet (25 mg total) by mouth 3 (three) times daily as needed for anxiety. 75 tablet 1   Insulin Lispro Prot & Lispro (HUMALOG MIX 75/25 KWIKPEN) (75-25) 100 UNIT/ML Kwikpen Inject 70 Units into the skin 3 (three) times daily.     LANTUS SOLOSTAR 100 UNIT/ML Solostar Pen Inject 50 Units into the skin 2 (two) times daily.     metolazone (ZAROXOLYN) 2.5 MG tablet Take 1 tablet (2.5  mg total) by mouth as directed. 4 tablet 1   OXYGEN Inhale 2-3 L into the lungs continuous.     pantoprazole (PROTONIX) 40 MG tablet Take 1 tablet (40 mg total) by mouth daily. 30 tablet 1   potassium chloride SA (KLOR-CON M) 20 MEQ tablet Take 1 tablet (20 mEq total) by mouth daily. 90 tablet 3   prazosin (MINIPRESS) 2 MG capsule Take 1 capsule (2 mg total) by mouth at bedtime. 30 capsule 1   rosuvastatin (CRESTOR) 20 MG tablet Take 1 tablet (20 mg total) by mouth daily. 30 tablet 1   sacubitril-valsartan (ENTRESTO) 49-51 MG Take 1 tablet by mouth 2 (two) times daily. 60 tablet 3   spironolactone (ALDACTONE) 25 MG tablet Take 1 tablet (25 mg total) by mouth daily. 30 tablet 3   terbinafine (LAMISIL) 1 % cream Apply 1 Application topically 2 (two) times daily.     traZODone (DESYREL) 100 MG tablet Take 2 tablets (200 mg total) by mouth at bedtime. 60 tablet 1   TRULICITY 1.5 0000000 SOPN Inject 1.5 mg into the skin once a week. 0.5 mL 4   torsemide (DEMADEX) 20 MG tablet Take 4 tablets (80 mg total) by mouth daily. 90 tablet 3   No current facility-administered medications for this encounter.    Allergies  Allergen Reactions   Aspirin Anaphylaxis and Other (See Comments)    Throat closing    Iodine-131 Hives   Iodinated Contrast Media Hives    Can be premedicated    Latex Hives and Rash   Penicillins Nausea And Vomiting   Ultram [Tramadol] Hives and Nausea Only   Augmentin [Amoxicillin-Pot Clavulanate] Diarrhea   Lidocaine Rash   Zestril [Lisinopril] Cough      Social History   Socioeconomic History   Marital status: Widowed    Spouse name: Not on file   Number of children: 7   Years of education: Not on file   Highest education level: Bachelor's degree (e.g., BA, AB, BS)  Occupational History   Occupation: disability  Tobacco Use   Smoking status: Former    Packs/day: 0.20    Types: Cigarettes    Quit date: 10/2021    Years since quitting: 0.4   Smokeless tobacco:  Never   Tobacco comments:    occasionally  Vaping Use   Vaping Use: Never used  Substance and Sexual Activity   Alcohol use: Not Currently   Drug use: Yes    Types: Cocaine    Comment: last used july   Sexual activity: Not Currently  Other Topics Concern   Not on file  Social History Narrative   Not on file   Social Determinants of Health   Financial Resource Strain: Low Risk  (03/17/2022)  Overall Financial Resource Strain (CARDIA)    Difficulty of Paying Living Expenses: Not hard at all  Food Insecurity: Food Insecurity Present (03/30/2022)   Hunger Vital Sign    Worried About Running Out of Food in the Last Year: Often true    Ran Out of Food in the Last Year: Often true  Transportation Needs: No Transportation Needs (03/17/2022)   PRAPARE - Hydrologist (Medical): No    Lack of Transportation (Non-Medical): No  Physical Activity: Not on file  Stress: Not on file  Social Connections: Not on file  Intimate Partner Violence: Not on file      Family History  Problem Relation Age of Onset   Hypertension Mother    Bone cancer Father    Lung cancer Father     Vitals:   04/14/22 1545  BP: 130/80  Pulse: 81  SpO2: 96%  Weight: (!) 145.2 kg (320 lb)    PHYSICAL EXAM: General: NAD, obese Neck: Thick, JVP difficult, no thyromegaly or thyroid nodule.  Lungs: Clear to auscultation bilaterally with normal respiratory effort. CV: Nondisplaced PMI.  Heart regular S1/S2, no S3/S4, no murmur.  1+ ankle edema.  No carotid bruit.  Unable to palpate pedal pulses.  Abdomen: Soft, nontender, no hepatosplenomegaly, no distention.  Skin: Intact without lesions or rashes.  Neurologic: Alert and oriented x 3.  Psych: Normal affect. Extremities: No clubbing or cyanosis.  HEENT: Normal.   ASSESSMENT & PLAN:  1. Chronic systolic CHF:  MDT CRT-D device.  Echoes 2021-2023, LVEF range 25-35%, RV mildly reduced.  Reportedly diagnosed with nonischemic  cardiomyopathy in 2012 in [place], etiology felt due to cocaine abuse.  [facility] 03/07/2019 notes mention patient has CAD with history of angioplasty in the past. Unable to locate cath reports/ details regarding coronary anatomy in record. Most recent echo in 2/23 showed EF 25%, mild LV dilation, mildly decreased RV function. RHC in 9/23 showed low CI at 1.82.  Ongoing NYHA class IIIb.  Exam is difficult for volume but he says weight is up at home and I suspect he is volume overloaded.  I tried to admit him last week but hospital was full and he would not wait in the ER.   - Increase torsemide to 80 mg daily and will give him 1 dose of metolazone 2.5 mg with tomorrow's torsemide. BMET/BNP today, BMET 10 days.   - Continue Entresto 49-51 mg bid.   - Continue Farxiga 10 mg daily - Continue Spironolactone 25 mg daily   - Continue Coreg 6.25 mg bid  - Continue Digoxin 0.125 mg daily, check level.  - I am going to arrange for RHC/LHC this week. I will assess for coronary disease as cause of cardiomyopathy and also assess filling pressures/cardiac output.  There is a good chance that I will admit him at this time. However, if symptoms are more due to OHS/OSA, increased diuresis may not be helpful.  We discussed risks benefits and he agrees to procedure.  - I suspect that with OHS/OSA, obesity, and his poor mobility that he will be a poor candidate for LVAD/transplant.  2. CAD: Documented in Care Everywhere, reported h/o angioplasty, details unknown (cannot find a cath report though prior CAD is referenced).  He has had atypical chest pain.   - continue Plavix 75 mg daily  - Continue Crestor 20 mg daily.  - As above, I will do coronary angiography to rule out CAD as cause of his cardiomyopathy  3. H/o VT: Has CRT-D w/ chronic systolic heart failure and QRS > 150 ms  - followed by [facility] device clinic  4. HTN: BP controlled.  5. Type 2DM - continue Insulin per PCP  - now on Farxiga  6. Cocaine:  Says he has quit for months now.    7. Depression: Depressed due to lack of social/family support and food insecurity  8. Jehovah's Witness.  9. OHS/OSA: He uses oxygen during the day and OSA at night.  This likely plays a role in his dyspnea.  10. Yeast infection under pannus: He will use Nystatin.   LHC/RHC arranged, may need to admit afterwards depending on filling pressures.   Loralie Champagne 04/15/2022

## 2022-04-16 ENCOUNTER — Other Ambulatory Visit: Payer: Self-pay

## 2022-04-16 ENCOUNTER — Inpatient Hospital Stay: Payer: Self-pay

## 2022-04-16 ENCOUNTER — Inpatient Hospital Stay (HOSPITAL_COMMUNITY)
Admission: RE | Admit: 2022-04-16 | Discharge: 2022-04-23 | DRG: 286 | Disposition: A | Discharge status: Home Health | Payer: Medicare Other | Attending: Cardiology | Admitting: Cardiology

## 2022-04-16 ENCOUNTER — Inpatient Hospital Stay (HOSPITAL_COMMUNITY)
Admission: RE | Disposition: A | Discharge status: Home / Self Care | Payer: Self-pay | Source: Home / Self Care | Attending: Cardiology

## 2022-04-16 DIAGNOSIS — I5022 Chronic systolic (congestive) heart failure: Secondary | ICD-10-CM

## 2022-04-16 DIAGNOSIS — Z9861 Coronary angioplasty status: Secondary | ICD-10-CM

## 2022-04-16 DIAGNOSIS — E785 Hyperlipidemia, unspecified: Secondary | ICD-10-CM | POA: Diagnosis present

## 2022-04-16 DIAGNOSIS — I82621 Acute embolism and thrombosis of deep veins of right upper extremity: Secondary | ICD-10-CM | POA: Diagnosis not present

## 2022-04-16 DIAGNOSIS — E662 Morbid (severe) obesity with alveolar hypoventilation: Secondary | ICD-10-CM | POA: Diagnosis present

## 2022-04-16 DIAGNOSIS — R609 Edema, unspecified: Secondary | ICD-10-CM | POA: Diagnosis not present

## 2022-04-16 DIAGNOSIS — I509 Heart failure, unspecified: Secondary | ICD-10-CM | POA: Diagnosis present

## 2022-04-16 DIAGNOSIS — I472 Ventricular tachycardia, unspecified: Secondary | ICD-10-CM | POA: Diagnosis present

## 2022-04-16 DIAGNOSIS — Z888 Allergy status to other drugs, medicaments and biological substances status: Secondary | ICD-10-CM

## 2022-04-16 DIAGNOSIS — Z7902 Long term (current) use of antithrombotics/antiplatelets: Secondary | ICD-10-CM

## 2022-04-16 DIAGNOSIS — Z1152 Encounter for screening for COVID-19: Secondary | ICD-10-CM | POA: Diagnosis not present

## 2022-04-16 DIAGNOSIS — T501X5A Adverse effect of loop [high-ceiling] diuretics, initial encounter: Secondary | ICD-10-CM | POA: Diagnosis not present

## 2022-04-16 DIAGNOSIS — Z801 Family history of malignant neoplasm of trachea, bronchus and lung: Secondary | ICD-10-CM

## 2022-04-16 DIAGNOSIS — I5023 Acute on chronic systolic (congestive) heart failure: Secondary | ICD-10-CM | POA: Diagnosis present

## 2022-04-16 DIAGNOSIS — Z6841 Body Mass Index (BMI) 40.0 and over, adult: Secondary | ICD-10-CM | POA: Diagnosis not present

## 2022-04-16 DIAGNOSIS — F32A Depression, unspecified: Secondary | ICD-10-CM | POA: Diagnosis present

## 2022-04-16 DIAGNOSIS — Y718 Miscellaneous cardiovascular devices associated with adverse incidents, not elsewhere classified: Secondary | ICD-10-CM | POA: Diagnosis present

## 2022-04-16 DIAGNOSIS — I5082 Biventricular heart failure: Secondary | ICD-10-CM | POA: Diagnosis present

## 2022-04-16 DIAGNOSIS — N179 Acute kidney failure, unspecified: Secondary | ICD-10-CM | POA: Diagnosis not present

## 2022-04-16 DIAGNOSIS — E1165 Type 2 diabetes mellitus with hyperglycemia: Secondary | ICD-10-CM | POA: Diagnosis present

## 2022-04-16 DIAGNOSIS — I272 Pulmonary hypertension, unspecified: Secondary | ICD-10-CM | POA: Diagnosis present

## 2022-04-16 DIAGNOSIS — Z794 Long term (current) use of insulin: Secondary | ICD-10-CM | POA: Diagnosis not present

## 2022-04-16 DIAGNOSIS — Z880 Allergy status to penicillin: Secondary | ICD-10-CM

## 2022-04-16 DIAGNOSIS — Z95810 Presence of automatic (implantable) cardiac defibrillator: Secondary | ICD-10-CM | POA: Diagnosis not present

## 2022-04-16 DIAGNOSIS — Z638 Other specified problems related to primary support group: Secondary | ICD-10-CM

## 2022-04-16 DIAGNOSIS — I5040 Unspecified combined systolic (congestive) and diastolic (congestive) heart failure: Secondary | ICD-10-CM

## 2022-04-16 DIAGNOSIS — T82868A Thrombosis of vascular prosthetic devices, implants and grafts, initial encounter: Secondary | ICD-10-CM | POA: Diagnosis not present

## 2022-04-16 DIAGNOSIS — I110 Hypertensive heart disease with heart failure: Secondary | ICD-10-CM | POA: Diagnosis present

## 2022-04-16 DIAGNOSIS — I82B11 Acute embolism and thrombosis of right subclavian vein: Secondary | ICD-10-CM | POA: Diagnosis not present

## 2022-04-16 DIAGNOSIS — F4310 Post-traumatic stress disorder, unspecified: Secondary | ICD-10-CM | POA: Diagnosis present

## 2022-04-16 DIAGNOSIS — I5020 Unspecified systolic (congestive) heart failure: Secondary | ICD-10-CM

## 2022-04-16 DIAGNOSIS — Z884 Allergy status to anesthetic agent status: Secondary | ICD-10-CM

## 2022-04-16 DIAGNOSIS — I2510 Atherosclerotic heart disease of native coronary artery without angina pectoris: Secondary | ICD-10-CM | POA: Diagnosis present

## 2022-04-16 DIAGNOSIS — Z91041 Radiographic dye allergy status: Secondary | ICD-10-CM

## 2022-04-16 DIAGNOSIS — B372 Candidiasis of skin and nail: Secondary | ICD-10-CM | POA: Diagnosis present

## 2022-04-16 DIAGNOSIS — Z7984 Long term (current) use of oral hypoglycemic drugs: Secondary | ICD-10-CM

## 2022-04-16 DIAGNOSIS — Y848 Other medical procedures as the cause of abnormal reaction of the patient, or of later complication, without mention of misadventure at the time of the procedure: Secondary | ICD-10-CM | POA: Diagnosis not present

## 2022-04-16 DIAGNOSIS — Z8249 Family history of ischemic heart disease and other diseases of the circulatory system: Secondary | ICD-10-CM

## 2022-04-16 DIAGNOSIS — Z79899 Other long term (current) drug therapy: Secondary | ICD-10-CM | POA: Diagnosis not present

## 2022-04-16 DIAGNOSIS — K219 Gastro-esophageal reflux disease without esophagitis: Secondary | ICD-10-CM | POA: Diagnosis present

## 2022-04-16 DIAGNOSIS — I428 Other cardiomyopathies: Secondary | ICD-10-CM | POA: Diagnosis present

## 2022-04-16 DIAGNOSIS — I5021 Acute systolic (congestive) heart failure: Secondary | ICD-10-CM | POA: Diagnosis present

## 2022-04-16 DIAGNOSIS — Z886 Allergy status to analgesic agent status: Secondary | ICD-10-CM

## 2022-04-16 HISTORY — PX: RIGHT/LEFT HEART CATH AND CORONARY ANGIOGRAPHY: CATH118266

## 2022-04-16 LAB — BRAIN NATRIURETIC PEPTIDE: B Natriuretic Peptide: 336.9 pg/mL — ABNORMAL HIGH (ref 0.0–100.0)

## 2022-04-16 LAB — COMPREHENSIVE METABOLIC PANEL
ALT: 18 U/L (ref 0–44)
AST: 20 U/L (ref 15–41)
Albumin: 3.3 g/dL — ABNORMAL LOW (ref 3.5–5.0)
Alkaline Phosphatase: 39 U/L (ref 38–126)
Anion gap: 9 (ref 5–15)
BUN: 12 mg/dL (ref 6–20)
CO2: 26 mmol/L (ref 22–32)
Calcium: 8.6 mg/dL — ABNORMAL LOW (ref 8.9–10.3)
Chloride: 104 mmol/L (ref 98–111)
Creatinine, Ser: 1.1 mg/dL (ref 0.61–1.24)
GFR, Estimated: >60 mL/min (ref >60)
Glucose, Bld: 396 mg/dL — ABNORMAL HIGH (ref 70–99)
Potassium: 4.2 mmol/L (ref 3.5–5.1)
Sodium: 139 mmol/L (ref 135–145)
Total Bilirubin: 0.3 mg/dL (ref 0.3–1.2)
Total Protein: 6.4 g/dL — ABNORMAL LOW (ref 6.5–8.1)

## 2022-04-16 LAB — CBC
HCT: 44.1 % (ref 39.0–52.0)
Hemoglobin: 14.5 g/dL (ref 13.0–17.0)
MCH: 28.9 pg (ref 26.0–34.0)
MCHC: 32.9 g/dL (ref 30.0–36.0)
MCV: 88 fL (ref 80.0–100.0)
Platelets: 250 10*3/uL (ref 150–400)
RBC: 5.01 MIL/uL (ref 4.22–5.81)
RDW: 13.6 % (ref 11.5–15.5)
WBC: 8.7 10*3/uL (ref 4.0–10.5)
nRBC: 0 % (ref 0.0–0.2)

## 2022-04-16 LAB — POCT I-STAT EG7
Acid-base deficit: 1 mmol/L (ref 0.0–2.0)
Acid-base deficit: 1 mmol/L (ref 0.0–2.0)
Bicarbonate: 25 mmol/L (ref 20.0–28.0)
Bicarbonate: 25.1 mmol/L (ref 20.0–28.0)
Calcium, Ion: 1.2 mmol/L (ref 1.15–1.40)
Calcium, Ion: 1.25 mmol/L (ref 1.15–1.40)
HCT: 38 % — ABNORMAL LOW (ref 39.0–52.0)
HCT: 38 % — ABNORMAL LOW (ref 39.0–52.0)
Hemoglobin: 12.9 g/dL — ABNORMAL LOW (ref 13.0–17.0)
Hemoglobin: 12.9 g/dL — ABNORMAL LOW (ref 13.0–17.0)
O2 Saturation: 59 %
O2 Saturation: 60 %
Potassium: 4.3 mmol/L (ref 3.5–5.1)
Potassium: 4.4 mmol/L (ref 3.5–5.1)
Sodium: 140 mmol/L (ref 135–145)
Sodium: 140 mmol/L (ref 135–145)
TCO2: 26 mmol/L (ref 22–32)
TCO2: 26 mmol/L (ref 22–32)
pCO2, Ven: 45 mmHg (ref 44–60)
pCO2, Ven: 45.8 mmHg (ref 44–60)
pH, Ven: 7.345 (ref 7.25–7.43)
pH, Ven: 7.354 (ref 7.25–7.43)
pO2, Ven: 33 mmHg (ref 32–45)
pO2, Ven: 33 mmHg (ref 32–45)

## 2022-04-16 LAB — GLUCOSE, CAPILLARY
Glucose-Capillary: 204 mg/dL — ABNORMAL HIGH (ref 70–99)
Glucose-Capillary: 195 mg/dL — ABNORMAL HIGH (ref 70–99)
Glucose-Capillary: 396 mg/dL — ABNORMAL HIGH (ref 70–99)
Glucose-Capillary: 479 mg/dL — ABNORMAL HIGH (ref 70–99)

## 2022-04-16 SURGERY — RIGHT/LEFT HEART CATH AND CORONARY ANGIOGRAPHY
Anesthesia: LOCAL

## 2022-04-16 MED ORDER — ACETAMINOPHEN 325 MG PO TABS: 650.0000 mg | ORAL_TABLET | ORAL | Status: DC | PRN

## 2022-04-16 MED ORDER — MIDAZOLAM HCL 2 MG/2ML IJ SOLN
INTRAMUSCULAR | Status: DC | PRN
  Administered 2022-04-16: 1 mg via INTRAVENOUS

## 2022-04-16 MED ORDER — CLOPIDOGREL BISULFATE 75 MG PO TABS
75.0000 mg | ORAL_TABLET | Freq: Every day | ORAL | Status: DC
  Administered 2022-04-16 – 2022-04-20 (×5): 75 mg via ORAL
  Filled 2022-04-16 (×5): qty 1

## 2022-04-16 MED ORDER — SODIUM CHLORIDE 0.9% FLUSH: 3.0000 mL | INTRAVENOUS | Status: DC | PRN

## 2022-04-16 MED ORDER — INSULIN ASPART 100 UNIT/ML IJ SOLN: 0.0000 [IU] | Freq: Three times a day (TID) | INTRAMUSCULAR | Status: DC

## 2022-04-16 MED ORDER — SPIRONOLACTONE 25 MG PO TABS
25.0000 mg | ORAL_TABLET | Freq: Every day | ORAL | Status: DC
  Administered 2022-04-17 – 2022-04-19 (×4): 25 mg via ORAL
  Filled 2022-04-16 (×5): qty 1

## 2022-04-16 MED ORDER — SACUBITRIL-VALSARTAN 49-51 MG PO TABS
1.0000 | ORAL_TABLET | Freq: Two times a day (BID) | ORAL | Status: DC
  Administered 2022-04-16 – 2022-04-19 (×6): 1 via ORAL
  Filled 2022-04-16 (×6): qty 1

## 2022-04-16 MED ORDER — DULOXETINE HCL 60 MG PO CPEP
90.0000 mg | ORAL_CAPSULE | Freq: Every day | ORAL | Status: DC
  Administered 2022-04-16 – 2022-04-23 (×8): 90 mg via ORAL
  Filled 2022-04-16 (×8): qty 1

## 2022-04-16 MED ORDER — PANTOPRAZOLE SODIUM 40 MG PO TBEC
40.0000 mg | DELAYED_RELEASE_TABLET | Freq: Every day | ORAL | Status: DC
  Administered 2022-04-16 – 2022-04-23 (×8): 40 mg via ORAL
  Filled 2022-04-16 (×8): qty 1

## 2022-04-16 MED ORDER — POTASSIUM CHLORIDE CRYS ER 20 MEQ PO TBCR
20.0000 meq | EXTENDED_RELEASE_TABLET | Freq: Every day | ORAL | Status: DC
  Administered 2022-04-16 – 2022-04-19 (×4): 20 meq via ORAL
  Filled 2022-04-16 (×5): qty 1

## 2022-04-16 MED ORDER — INSULIN ASPART 100 UNIT/ML IJ SOLN
0.0000 [IU] | Freq: Three times a day (TID) | INTRAMUSCULAR | Status: DC
  Administered 2022-04-16: 15 [IU] via SUBCUTANEOUS
  Administered 2022-04-17: 3 [IU] via SUBCUTANEOUS
  Administered 2022-04-17: 5 [IU] via SUBCUTANEOUS
  Administered 2022-04-17: 8 [IU] via SUBCUTANEOUS
  Administered 2022-04-17: 3 [IU] via SUBCUTANEOUS
  Administered 2022-04-18: 5 [IU] via SUBCUTANEOUS
  Administered 2022-04-18: 3 [IU] via SUBCUTANEOUS
  Administered 2022-04-18: 2 [IU] via SUBCUTANEOUS
  Administered 2022-04-18: 3 [IU] via SUBCUTANEOUS
  Administered 2022-04-19: 8 [IU] via SUBCUTANEOUS
  Administered 2022-04-19: 3 [IU] via SUBCUTANEOUS
  Administered 2022-04-20 (×2): 2 [IU] via SUBCUTANEOUS
  Administered 2022-04-20: 5 [IU] via SUBCUTANEOUS
  Administered 2022-04-21 (×3): 3 [IU] via SUBCUTANEOUS
  Administered 2022-04-22: 2 [IU] via SUBCUTANEOUS
  Administered 2022-04-22 (×2): 3 [IU] via SUBCUTANEOUS
  Administered 2022-04-22: 5 [IU] via SUBCUTANEOUS
  Administered 2022-04-23: 3 [IU] via SUBCUTANEOUS
  Administered 2022-04-23: 2 [IU] via SUBCUTANEOUS

## 2022-04-16 MED ORDER — DIPHENHYDRAMINE HCL 50 MG/ML IJ SOLN
25.0000 mg | Freq: Once | INTRAMUSCULAR | Status: AC
  Administered 2022-04-16: 25 mg via INTRAVENOUS
  Filled 2022-04-16: qty 1

## 2022-04-16 MED ORDER — HEPARIN SODIUM (PORCINE) 1000 UNIT/ML IJ SOLN
INTRAMUSCULAR | Status: AC
  Filled 2022-04-16: qty 10

## 2022-04-16 MED ORDER — SODIUM CHLORIDE 0.9 % IV SOLN: 250.0000 mL | INTRAVENOUS | Status: DC | PRN

## 2022-04-16 MED ORDER — FUROSEMIDE 10 MG/ML IJ SOLN
INTRAMUSCULAR | Status: AC
  Filled 2022-04-16: qty 8

## 2022-04-16 MED ORDER — DAPAGLIFLOZIN PROPANEDIOL 10 MG PO TABS
10.0000 mg | ORAL_TABLET | Freq: Every day | ORAL | Status: DC
  Administered 2022-04-16 – 2022-04-23 (×8): 10 mg via ORAL
  Filled 2022-04-16 (×8): qty 1

## 2022-04-16 MED ORDER — TRAZODONE HCL 100 MG PO TABS
200.0000 mg | ORAL_TABLET | Freq: Every day | ORAL | Status: DC
  Administered 2022-04-16 – 2022-04-22 (×7): 200 mg via ORAL
  Filled 2022-04-16 (×7): qty 2

## 2022-04-16 MED ORDER — SODIUM CHLORIDE 0.9% FLUSH: 3.0000 mL | Freq: Two times a day (BID) | INTRAVENOUS | Status: DC

## 2022-04-16 MED ORDER — MIDAZOLAM HCL 2 MG/2ML IJ SOLN
INTRAMUSCULAR | Status: AC
  Filled 2022-04-16: qty 2

## 2022-04-16 MED ORDER — VERAPAMIL HCL 2.5 MG/ML IV SOLN
INTRAVENOUS | Status: AC
  Filled 2022-04-16: qty 2

## 2022-04-16 MED ORDER — MILRINONE LACTATE IN DEXTROSE 20-5 MG/100ML-% IV SOLN
0.2500 ug/kg/min | INTRAVENOUS | Status: DC
  Administered 2022-04-16 – 2022-04-18 (×5): 0.25 ug/kg/min via INTRAVENOUS
  Filled 2022-04-16 (×5): qty 100

## 2022-04-16 MED ORDER — HEPARIN SODIUM (PORCINE) 1000 UNIT/ML IJ SOLN
INTRAMUSCULAR | Status: DC | PRN
  Administered 2022-04-16: 6000 [IU] via INTRAVENOUS

## 2022-04-16 MED ORDER — ENOXAPARIN SODIUM 40 MG/0.4ML IJ SOSY
40.0000 mg | PREFILLED_SYRINGE | INTRAMUSCULAR | Status: DC
  Administered 2022-04-19 – 2022-04-20 (×2): 40 mg via SUBCUTANEOUS
  Filled 2022-04-16 (×4): qty 0.4

## 2022-04-16 MED ORDER — PRAZOSIN HCL 2 MG PO CAPS
2.0000 mg | ORAL_CAPSULE | Freq: Every day | ORAL | Status: DC
  Administered 2022-04-17 – 2022-04-22 (×7): 2 mg via ORAL
  Filled 2022-04-16 (×8): qty 1

## 2022-04-16 MED ORDER — ONDANSETRON HCL 4 MG/2ML IJ SOLN: 4.0000 mg | Freq: Four times a day (QID) | INTRAMUSCULAR | Status: DC | PRN

## 2022-04-16 MED ORDER — FENTANYL CITRATE (PF) 100 MCG/2ML IJ SOLN
INTRAMUSCULAR | Status: AC
  Filled 2022-04-16: qty 2

## 2022-04-16 MED ORDER — IOHEXOL 350 MG/ML SOLN
INTRAVENOUS | Status: DC | PRN
  Administered 2022-04-16: 30 mL

## 2022-04-16 MED ORDER — INSULIN LISPRO PROT & LISPRO (75-25 MIX) 100 UNIT/ML KWIKPEN: 70.0000 [IU] | PEN_INJECTOR | Freq: Three times a day (TID) | SUBCUTANEOUS | Status: DC

## 2022-04-16 MED ORDER — FENTANYL CITRATE (PF) 100 MCG/2ML IJ SOLN
INTRAMUSCULAR | Status: DC | PRN
  Administered 2022-04-16: 25 ug via INTRAVENOUS

## 2022-04-16 MED ORDER — TETRACAINE HCL 1 % IJ SOLN
5.0000 mg | Freq: Once | INTRAMUSCULAR | Status: DC
  Filled 2022-04-16: qty 2

## 2022-04-16 MED ORDER — SODIUM CHLORIDE 0.9 % IV SOLN: INTRAVENOUS | Status: DC

## 2022-04-16 MED ORDER — LABETALOL HCL 5 MG/ML IV SOLN: 10.0000 mg | INTRAVENOUS | Status: DC | PRN

## 2022-04-16 MED ORDER — METHYLPREDNISOLONE SODIUM SUCC 125 MG IJ SOLR
125.0000 mg | Freq: Once | INTRAMUSCULAR | Status: AC
  Administered 2022-04-16: 125 mg via INTRAVENOUS
  Filled 2022-04-16: qty 2

## 2022-04-16 MED ORDER — HEPARIN (PORCINE) IN NACL 1000-0.9 UT/500ML-% IV SOLN
INTRAVENOUS | Status: AC
  Filled 2022-04-16: qty 1000

## 2022-04-16 MED ORDER — LIDOCAINE HCL (PF) 1 % IJ SOLN
INTRAMUSCULAR | Status: AC
  Filled 2022-04-16: qty 30

## 2022-04-16 MED ORDER — SODIUM CHLORIDE 0.9 % IV SOLN
250.0000 mL | INTRAVENOUS | Status: DC | PRN
  Administered 2022-04-16: 250 mL via INTRAVENOUS

## 2022-04-16 MED ORDER — ROSUVASTATIN CALCIUM 20 MG PO TABS
20.0000 mg | ORAL_TABLET | Freq: Every day | ORAL | Status: DC
  Administered 2022-04-16 – 2022-04-23 (×8): 20 mg via ORAL
  Filled 2022-04-16 (×8): qty 1

## 2022-04-16 MED ORDER — FUROSEMIDE 10 MG/ML IJ SOLN
80.0000 mg | Freq: Two times a day (BID) | INTRAMUSCULAR | Status: DC
  Administered 2022-04-16 – 2022-04-17 (×2): 80 mg via INTRAVENOUS
  Filled 2022-04-16: qty 8

## 2022-04-16 MED ORDER — INSULIN GLARGINE-YFGN 100 UNIT/ML ~~LOC~~ SOLN
50.0000 [IU] | Freq: Two times a day (BID) | SUBCUTANEOUS | Status: DC
  Administered 2022-04-16 – 2022-04-23 (×14): 50 [IU] via SUBCUTANEOUS
  Filled 2022-04-16 (×16): qty 0.5

## 2022-04-16 MED ORDER — INSULIN ASPART 100 UNIT/ML IJ SOLN
0.0000 [IU] | Freq: Three times a day (TID) | INTRAMUSCULAR | Status: DC
  Administered 2022-04-16: 15 [IU] via SUBCUTANEOUS

## 2022-04-16 MED ORDER — HYDRALAZINE HCL 20 MG/ML IJ SOLN: 10.0000 mg | INTRAMUSCULAR | Status: DC | PRN

## 2022-04-16 MED ORDER — SODIUM CHLORIDE 0.9% FLUSH
3.0000 mL | Freq: Two times a day (BID) | INTRAVENOUS | Status: DC
  Administered 2022-04-17 – 2022-04-23 (×12): 3 mL via INTRAVENOUS

## 2022-04-16 MED ORDER — ORAL CARE MOUTH RINSE: 15.0000 mL | OROMUCOSAL | Status: DC | PRN

## 2022-04-16 MED ORDER — CARVEDILOL 3.125 MG PO TABS
3.1250 mg | ORAL_TABLET | Freq: Two times a day (BID) | ORAL | Status: DC
  Administered 2022-04-16 – 2022-04-23 (×14): 3.125 mg via ORAL
  Filled 2022-04-16 (×14): qty 1

## 2022-04-16 MED ORDER — GUAIFENESIN-DM 100-10 MG/5ML PO SYRP
5.0000 mL | ORAL_SOLUTION | ORAL | Status: DC | PRN
  Administered 2022-04-16 – 2022-04-23 (×5): 5 mL via ORAL
  Filled 2022-04-16 (×5): qty 5

## 2022-04-16 MED ORDER — TETRACAINE HCL 1 % IJ SOLN
INTRAMUSCULAR | Status: DC | PRN
  Administered 2022-04-16: 2 mg

## 2022-04-16 MED ORDER — DIGOXIN 125 MCG PO TABS
0.1250 mg | ORAL_TABLET | Freq: Every day | ORAL | Status: DC
  Administered 2022-04-16 – 2022-04-23 (×8): 0.125 mg via ORAL
  Filled 2022-04-16 (×8): qty 1

## 2022-04-16 MED ORDER — HYDROXYZINE HCL 25 MG PO TABS
25.0000 mg | ORAL_TABLET | Freq: Three times a day (TID) | ORAL | Status: DC | PRN
  Administered 2022-04-16: 25 mg via ORAL
  Filled 2022-04-16: qty 1

## 2022-04-16 SURGICAL SUPPLY — 10 items
CATH 5FR JL3.5 JR4 ANG PIG MP (CATHETERS) IMPLANT
CATH BALLN WEDGE 5F 110CM (CATHETERS) IMPLANT
GLIDESHEATH SLEND SS 6F .021 (SHEATH) IMPLANT
GUIDEWIRE .025 260CM (WIRE) IMPLANT
GUIDEWIRE INQWIRE 1.5J.035X260 (WIRE) IMPLANT
INQWIRE 1.5J .035X260CM (WIRE) ×1
KIT HEART LEFT (KITS) ×1 IMPLANT
PACK CARDIAC CATHETERIZATION (CUSTOM PROCEDURE TRAY) ×1 IMPLANT
SHEATH GLIDE SLENDER 4/5FR (SHEATH) IMPLANT
TRANSDUCER W/STOPCOCK (MISCELLANEOUS) ×1 IMPLANT

## 2022-04-16 NOTE — Progress Notes (Signed)
Heart Failure Navigator Progress Note  Assessed for Heart & Vascular TOC clinic readiness.  Patient does not meet criteria due to Advanced Heart Failure Team, Dr. [place].     Ethel Meisenheimer, BSN, RN Heart Failure Nurse Navigator Secure Chat Only   

## 2022-04-16 NOTE — Progress Notes (Signed)
CBG = [phone] tonight. MD paged.

## 2022-04-16 NOTE — H&P (Addendum)
Advanced Heart Failure Team History and Physical Note   PCP:  Buzzy Han, MD (Inactive)  PCP-Cardiology: Glenetta Hew, MD    [facility]: Dr. Aundra Dubin   Reason for Admission: Acute on Chronic Systolic Heart Failure w/ Low Output    HPI:    51 y.o. male with h/o chronic systolic heart failure, VT s/p ICD (MDT CRT-D), HTN, HLD, Type 2 DM, OSA on CPAP and h/o substance use (cocaine). He has a reported h/o CAD/PCI in [place] though his cardiomyopathy has been classified as NICM. Unfortunately, I am unable to locate LHC/ angiographic details in Care Everywhere. Echos from 2021-2023 have shown LVEF ranging from 25-35%, RV mildly reduced.  Cardiomyopathy felt likely caused by cocaine use.    He was admitted to [facility] 9/23 for acute on chronic systolic CHF.  ICD was also noted to be at [facility]. Echo showed LVEF 25% w/ global HK, RV mildly reduced. He was diuresed w/ IV Lasix and underwent RHC revealing normal filling pressures and reduced CO w/ CI of 1.82 L/min/m. He was placed on GDMT and transitioned to PO torsemide. Underwent gen change on 9/25 prior to d/c. D/c wt 296 lb. Referred to [facility] clinic at d/c. From there, he was referred to the [facility].    Seen in [facility] on 10/17 w/ NYHA Class IIIb symptoms and fluid overload. Diuretics increased set up for RHC.   Presented today for [facility] which demonstrated elevated filling pressures and low output. mRA 14, PAP 45/22, mPCWP 27, LVEDP 32, PA sat 60%, CI 1.77. LHC showed no significant CAD.   Being direct admitted from cath lab for IV diuresis and initiation of inotropic support w/ milrinone.   Of note, pt is also a [employer].   Review of Systems: [y] = yes, [ ]  = no   General: Weight gain [ ] ; Weight loss [ ] ; Anorexia [ ] ; Fatigue [ ] ; Fever [ ] ; Chills [ ] ; Weakness [ ]   Cardiac: Chest pain/pressure [ ] ; Resting SOB [Y ]; Exertional SOB [ Y]; Orthopnea [ ] ; Pedal Edema [ ] ; Palpitations [ ] ; Syncope [ ] ; Presyncope [ ] ; Paroxysmal  nocturnal dyspnea[ ]   Pulmonary: Cough [ ] ; Wheezing[ ] ; Hemoptysis[ ] ; Sputum [ ] ; Snoring [ ]   GI: Vomiting[ ] ; Dysphagia[ ] ; Melena[ ] ; Hematochezia [ ] ; Heartburn[ ] ; Abdominal pain [ ] ; Constipation [ ] ; Diarrhea [ ] ; BRBPR [ ]   GU: Hematuria[ ] ; Dysuria [ ] ; Nocturia[ ]   Vascular: Pain in legs with walking [ ] ; Pain in feet with lying flat [ ] ; Non-healing sores [ ] ; Stroke [ ] ; TIA [ ] ; Slurred speech [ ] ;  Neuro: Headaches[ ] ; Vertigo[ ] ; Seizures[ ] ; Paresthesias[ ] ;Blurred vision [ ] ; Diplopia [ ] ; Vision changes [ ]   Ortho/Skin: Arthritis [ ] ; Joint pain [ ] ; Muscle pain [ ] ; Joint swelling [ ] ; Back Pain [ ] ; Rash [ ]   Psych: Depression[ ] ; Anxiety[ ]   Heme: Bleeding problems [ ] ; Clotting disorders [ ] ; Anemia [ ]   Endocrine: Diabetes [ ] ; Thyroid dysfunction[ ]    Home Medications Prior to Admission medications   Medication Sig Start Date End Date Taking? Authorizing Provider  carvedilol (COREG) 6.25 MG tablet Take 1 tablet (6.25 mg total) by mouth 2 (two) times daily with a meal. 03/30/22  Yes Lyda Jester M, PA-C  clopidogrel (PLAVIX) 75 MG tablet Take 1 tablet (75 mg total) by mouth daily. 08/22/21  Yes Clapacs, Madie Reno, MD  dapagliflozin propanediol (FARXIGA) 10 MG TABS tablet  Take 1 tablet (10 mg total) by mouth daily. 03/30/22  Yes Lyda Jester M, PA-C  digoxin (LANOXIN) 0.125 MG tablet Take 1 tablet (0.125 mg total) by mouth daily. 03/30/22  Yes Lyda Jester M, PA-C  DULoxetine (CYMBALTA) 30 MG capsule Take 3 capsules (90 mg total) by mouth daily. 09/03/21  Yes Nwoko, Terese Door, PA  hydrOXYzine (ATARAX) 25 MG tablet Take 1 tablet (25 mg total) by mouth 3 (three) times daily as needed for anxiety. 09/03/21  Yes Nwoko, Uchenna E, PA  Insulin Lispro Prot & Lispro (HUMALOG MIX 75/25 KWIKPEN) (75-25) 100 UNIT/ML Kwikpen Inject 70 Units into the skin 3 (three) times daily.   Yes [provider]  pantoprazole (PROTONIX) 40 MG tablet Take 1 tablet (40 mg total) by  mouth daily. 08/21/21  Yes Clapacs, Madie Reno, MD  prazosin (MINIPRESS) 2 MG capsule Take 1 capsule (2 mg total) by mouth at bedtime. 09/03/21  Yes Nwoko, Uchenna E, PA  rosuvastatin (CRESTOR) 20 MG tablet Take 1 tablet (20 mg total) by mouth daily. 08/22/21  Yes Clapacs, Madie Reno, MD  sacubitril-valsartan (ENTRESTO) 49-51 MG Take 1 tablet by mouth 2 (two) times daily. 03/30/22  Yes Lyda Jester M, PA-C  spironolactone (ALDACTONE) 25 MG tablet Take 1 tablet (25 mg total) by mouth daily. 03/30/22  Yes Rosita Fire, Brittainy M, PA-C  torsemide (DEMADEX) 20 MG tablet Take 4 tablets (80 mg total) by mouth daily. 04/14/22  Yes Larey Dresser, MD  TRULICITY 1.5 0000000 SOPN Inject 1.5 mg into the skin once a week. 08/21/21  Yes Clapacs, Madie Reno, MD  LANTUS SOLOSTAR 100 UNIT/ML Solostar Pen Inject 50 Units into the skin 2 (two) times daily. 01/12/22   [provider]  metolazone (ZAROXOLYN) 2.5 MG tablet Take 1 tablet (2.5 mg total) by mouth as directed. 04/14/22   Larey Dresser, MD  OXYGEN Inhale 2-3 L into the lungs continuous.    [provider]  potassium chloride SA (KLOR-CON M) 20 MEQ tablet Take 1 tablet (20 mEq total) by mouth daily. 04/14/22   Larey Dresser, MD  terbinafine (LAMISIL) 1 % cream Apply 1 Application topically 2 (two) times daily.    [provider]  traZODone (DESYREL) 100 MG tablet Take 2 tablets (200 mg total) by mouth at bedtime. 09/03/21   Malachy Mood, PA    Past Medical History: Past Medical History:  Diagnosis Date   Anxiety    Biventricular ICD (implantable cardioverter-defibrillator) in place    Chronic systolic CHF (congestive heart failure) ([place])    Cocaine use    Diabetes mellitus without complication (HCC)    GERD (gastroesophageal reflux disease)    HLD (hyperlipidemia)    Hypertension    Morbid obesity (HCC)    NICM (nonischemic cardiomyopathy) (HCC)    OSA (obstructive sleep apnea)    PTSD (post-traumatic stress disorder)    on  Depakote   Refusal of blood transfusions as patient is Jehovah's Witness    Tobacco abuse     Past Surgical History: Past Surgical History:  Procedure Laterality Date   BIV ICD INSERTION CRT-D     FOOT FRACTURE SURGERY     ICD GENERATOR CHANGEOUT N/A 03/23/2022   Procedure: ICD GENERATOR [place];  Surgeon: Mealor, Yetta Barre, MD;  Location: [place] [facility];  Service: Cardiovascular;  Laterality: N/A;   RIGHT HEART CATH N/A 03/06/2022   Procedure: RIGHT HEART CATH;  Surgeon: Early Osmond, MD;  Location: [place] [facility];  Service: Cardiovascular;  Laterality: N/A;   ROTATOR CUFF REPAIR     TONSILLECTOMY      Family History:  Family History  Problem Relation Age of Onset   Hypertension Mother    Bone cancer Father    Lung cancer Father     Social History: Social History   Socioeconomic History   Marital status: Widowed    Spouse name: Not on file   Number of children: 7   Years of education: Not on file   Highest education level: Bachelor's degree (e.g., BA, AB, BS)  Occupational History   Occupation: disability  Tobacco Use   Smoking status: Former    Packs/day: 0.20    Types: Cigarettes    Quit date: 10/2021    Years since quitting: 0.4   Smokeless tobacco: Never   Tobacco comments:    occasionally  Vaping Use   Vaping Use: Never used  Substance and Sexual Activity   Alcohol use: Not Currently   Drug use: Yes    Types: Cocaine    Comment: last used july   Sexual activity: Not Currently  Other Topics Concern   Not on file  Social History Narrative   Not on file   Social Determinants of Health   Financial Resource Strain: Low Risk  (03/17/2022)   Overall Financial Resource Strain (CARDIA)    Difficulty of Paying Living Expenses: Not hard at all  Food Insecurity: Food Insecurity Present (03/30/2022)   Hunger Vital Sign    Worried About Running Out of Food in the Last Year: Often true    Ran Out of Food in the Last Year: Often true  Transportation  Needs: No Transportation Needs (03/17/2022)   PRAPARE - Hydrologist (Medical): No    Lack of Transportation (Non-Medical): No  Physical Activity: Not on file  Stress: Not on file  Social Connections: Not on file    Allergies:  Allergies  Allergen Reactions   Aspirin Anaphylaxis and Other (See Comments)    Throat closing    Iodine-131 Hives   Iodinated Contrast Media Hives    Can be premedicated    Latex Hives and Rash   Penicillins Nausea And Vomiting   Ultram [Tramadol] Hives and Nausea Only   Peanut (Diagnostic) Other (See Comments)    Throat closes   Augmentin [Amoxicillin-Pot Clavulanate] Diarrhea   Lidocaine Rash   Zestril [Lisinopril] Cough    Objective:    Vital Signs:   Temp:  [98.8 F (37.1 C)] 98.8 F (37.1 C) (10/19 1149) Pulse Rate:  [73-88] 75 (10/19 1445) Resp:  [15-30] 16 (10/19 1445) BP: (121-143)/(67-92) 140/80 (10/19 1445) SpO2:  [96 %-100 %] 98 % (10/19 1445) Weight:  [142.9 kg] 142.9 kg (10/19 1145)   Filed Weights   04/16/22 1145  Weight: (!) 142.9 kg     Physical Exam     General:  fatigued appearing, moderately obese. No respiratory difficulty HEENT: Normal Neck: Supple. no JVD 14 cm. Carotids 2+ bilat; no bruits. No lymphadenopathy or thyromegaly appreciated. Cor: PMI nondisplaced. Regular rate & rhythm. No rubs, gallops or murmurs. Lungs: Clear Abdomen: Soft, nontender, nondistended. No hepatosplenomegaly. No bruits or masses. Good bowel sounds. Extremities: No cyanosis, clubbing, rash, edema Neuro: Alert & oriented x 3, cranial nerves grossly intact. moves all 4 extremities w/o difficulty. Affect pleasant.   Telemetry   NSR 80s   EKG   N/A   Labs     Basic Metabolic Panel: Recent Labs  Lab 04/14/22 1616 04/16/22 1355 04/16/22 1356  NA 137 140 140  K 4.3 4.4 4.3  CL 102  --   --   CO2 29  --   --   GLUCOSE 168*  --   --   BUN 9  --   --   CREATININE 1.15  --   --   CALCIUM 8.5*  --    --     Liver Function Tests: Recent Labs  Lab 04/14/22 1616  AST 24  ALT 17  ALKPHOS 42  BILITOT 0.2*  PROT 5.4*  ALBUMIN 3.1*   No results for input(s): "LIPASE", "AMYLASE" in the last 168 hours. No results for input(s): "AMMONIA" in the last 168 hours.  CBC: Recent Labs  Lab 04/14/22 1616 04/16/22 1355 04/16/22 1356  WBC 10.5  --   --   HGB 13.8 12.9* 12.9*  HCT 43.8 38.0* 38.0*  MCV 91.4  --   --   PLT 238  --   --     Cardiac Enzymes: No results for input(s): "CKTOTAL", "CKMB", "CKMBINDEX", "TROPONINI" in the last 168 hours.  BNP: BNP (last 3 results) Recent Labs    03/16/22 1849 04/07/22 1607 04/14/22 1616  BNP 93.3 180.1* 161.9*    ProBNP (last 3 results) No results for input(s): "PROBNP" in the last 8760 hours.   CBG: Recent Labs  Lab 04/16/22 1200 04/16/22 1438  GLUCAP 204* 195*    Coagulation Studies: No results for input(s): "LABPROT", "INR" in the last 72 hours.  Imaging: [place] EKG SITE RITE  Result Date: 04/16/2022 If Site Rite image not attached, placement could not be confirmed due to current cardiac rhythm.    Patient Profile   51 y.o. male with h/o chronic systolic heart failure, VT s/p ICD (MDT CRT-D), HTN, HLD, Type 2 DM, OSA on CPAP and h/o substance use (cocaine) referred for [facility] which demonstrated severe NICM w/ elevated filling pressures and low output. Direct admitted from cath lab for IV diuretics and initiation of inotropic support w/ milrinone.   Assessment/Plan   1. Acute on Chronic Systolic CHF:  MDT CRT-D device.  Echoes 2021-2023, LVEF range 25-35%, RV mildly reduced.  Reportedly diagnosed with nonischemic cardiomyopathy in 2012 in [place], etiology felt due to cocaine abuse.  [place] [facility]ology 03/07/2019 notes mention patient has CAD with history of angioplasty in the past. Unable to locate cath reports/ details regarding coronary anatomy in record. Most recent echo in 2/23 showed EF 25%, mild LV dilation, mildly  decreased RV function. RHC in 9/23 showed low CI at 1.82.  Ongoing NYHA class IIIb symptoms. Repeat RHC done today showed elevated filling pressures and low output, mRA 14, PAP 45/22, mPCWP 27, LVEDP 32, PA sat 60%, CI 1.77. LHC showed no significant coronary disease. Being direct admitted for IV diuretics and initiation of inotropic support - start milrinone 0.25 mcg/kg/min. Place PICC to follow co-ox and CVP - start IV Lasix 80 mg bid  - Continue Entresto 49-51 mg bid   - Continue Farxiga 10 mg daily - Continue Spironolactone 25 mg daily   - Reduce Coreg to 3.125 mg bid given low output  - Continue Digoxin 0.125 mg daily, check level - I suspect that with OHS/OSA, obesity, and his poor mobility that he will be a poor candidate for LVAD/transplant.  2. CAD: Documented in Care Everywhere, reported h/o angioplasty, details unknown (cannot find a cath report though prior CAD is referenced).  He has had atypical chest pain.  LHC done today showed no significant CAD - on Plavix 75 mg daily. ? Switch to ASA alone  - Continue Crestor 20 mg daily.  3. H/o VT: Has CRT-D w/ chronic systolic heart failure and QRS > 150 ms  - followed by [facility] device clinic  4. HTN: BP controlled.  - continue GDMT per above  5. Type 2DM - continue Insulin per PCP  - now on Farxiga  6. Cocaine: Says he has quit for months now.    7. Depression: Depressed due to lack of social/family support and food insecurity  - continue Cymbalta  8. Jehovah's Witness.  9. OHS/OSA: He uses oxygen during the day and OSA at night.  This likely plays a role in his dyspnea.  10. Yeast infection under pannus: He will use Nystatin.  - monitor w/ Rayetta Pigg, PA-C 04/16/2022, 2:51 PM  Advanced Heart Failure Team Pager [phone] (M-F; 7a - 5p)  Please contact [facility] for night-coverage after hours (4p -7a ) and weekends on [email or web address]   Patient seen with PA, agree with the above note.    Patient has had NYHA class  IIIb-IV symptoms, suspect combination of CHF and OHS/OSA.  He is not very mobile at home.    RHC/LHC today: Coronary Findings  Diagnostic Dominance: Left Left Main  Vessel was injected. Vessel is normal in caliber. Vessel is angiographically normal.    Left Anterior Descending  The vessel exhibits minimal luminal irregularities.    Left Circumflex  The vessel exhibits minimal luminal irregularities.    Right Coronary Artery  Vessel was injected. Vessel is normal in caliber. Vessel is angiographically normal.    Intervention   No interventions have been documented.   Right Heart  Right Heart Pressures RHC Procedural Findings: Hemodynamics (mmHg) RA mean 14 RV 41/21 PA 45/22, mean 31 PCWP mean 27 LV 112/32  Oxygen saturations: PA 60% AO 100%  Cardiac Output (Fick) 4.3  Cardiac Index (Fick) 1.77 PVR 1 WU PAPI 1.6   General: NAD Neck: Thick, JVP difficult but appears elevated, no thyromegaly or thyroid nodule.  Lungs: Clear to auscultation bilaterally with normal respiratory effort. CV: Nondisplaced PMI.  Heart regular S1/S2, no S3/S4, no murmur.  1+ edema to knees.  No carotid bruit.  Difficult to palpate pedal pulses.  Abdomen: Soft, nontender, no hepatosplenomegaly, no distention.  Skin: Intact without lesions or rashes.  Neurologic: Alert and oriented x 3.  Psych: Normal affect. Extremities: No clubbing or cyanosis.  HEENT: Normal.   Patient was found to be significantly volume overloaded with biventricular failure and low cardiac output by RHC today.  No significant coronary disease on LHC.  - Lasix 80 mg IV bid.  - Start milrinone 0.25 to aide diuresis.  - Decrease Coreg to 3.125 mg bid.  - Continue other home cardiomyopathy meds.  - Place PICC to follow CVP and co-ox.  - Send full labs.   Loralie Champagne 04/16/2022 4:22 PM

## 2022-04-16 NOTE — Interval H&P Note (Signed)
History and Physical Interval Note:  04/16/2022 1:30 PM  Luis [name]  has presented today for surgery, with the diagnosis of chf.  The various methods of treatment have been discussed with the patient and family. After consideration of risks, benefits and other options for treatment, the patient has consented to  Procedure(s): RIGHT/LEFT HEART CATH AND CORONARY ANGIOGRAPHY (N/A) as a surgical intervention.  The patient's history has been reviewed, patient examined, no change in status, stable for surgery.  I have reviewed the patient's chart and labs.  Questions were answered to the patient's satisfaction.     [name]

## 2022-04-17 ENCOUNTER — Encounter (HOSPITAL_COMMUNITY): Payer: Self-pay | Admitting: Cardiology

## 2022-04-17 ENCOUNTER — Inpatient Hospital Stay (HOSPITAL_COMMUNITY): Payer: Medicare Other

## 2022-04-17 ENCOUNTER — Telehealth (HOSPITAL_COMMUNITY): Payer: Self-pay | Admitting: *Deleted

## 2022-04-17 DIAGNOSIS — I5023 Acute on chronic systolic (congestive) heart failure: Secondary | ICD-10-CM | POA: Diagnosis not present

## 2022-04-17 LAB — BASIC METABOLIC PANEL
Anion gap: 10 (ref 5–15)
BUN: 14 mg/dL (ref 6–20)
CO2: 24 mmol/L (ref 22–32)
Calcium: 8.8 mg/dL — ABNORMAL LOW (ref 8.9–10.3)
Chloride: 102 mmol/L (ref 98–111)
Creatinine, Ser: 1.17 mg/dL (ref 0.61–1.24)
GFR, Estimated: >60 mL/min (ref >60)
Glucose, Bld: 303 mg/dL — ABNORMAL HIGH (ref 70–99)
Potassium: 4 mmol/L (ref 3.5–5.1)
Sodium: 136 mmol/L (ref 135–145)

## 2022-04-17 LAB — COOXEMETRY PANEL
Carboxyhemoglobin: 1.3 % (ref 0.5–1.5)
Methemoglobin: <0.7 % (ref 0.0–1.5)
O2 Saturation: 71.2 %
Total hemoglobin: 12.6 g/dL (ref 12.0–16.0)

## 2022-04-17 LAB — GLUCOSE, CAPILLARY
Glucose-Capillary: 176 mg/dL — ABNORMAL HIGH (ref 70–99)
Glucose-Capillary: 191 mg/dL — ABNORMAL HIGH (ref 70–99)
Glucose-Capillary: 224 mg/dL — ABNORMAL HIGH (ref 70–99)
Glucose-Capillary: 252 mg/dL — ABNORMAL HIGH (ref 70–99)
Glucose-Capillary: 333 mg/dL — ABNORMAL HIGH (ref 70–99)

## 2022-04-17 LAB — DIGOXIN LEVEL: Digoxin Level: <0.2 ng/mL — ABNORMAL LOW (ref 0.8–2.0)

## 2022-04-17 MED ORDER — NYSTATIN 100000 UNIT/GM EX CREA
TOPICAL_CREAM | Freq: Two times a day (BID) | CUTANEOUS | Status: DC
  Administered 2022-04-22: 1 via TOPICAL
  Filled 2022-04-17: qty 30

## 2022-04-17 MED ORDER — OXYCODONE HCL 5 MG PO TABS
5.0000 mg | ORAL_TABLET | Freq: Once | ORAL | Status: AC
  Administered 2022-04-17: 5 mg via ORAL
  Filled 2022-04-17: qty 1

## 2022-04-17 MED ORDER — SODIUM CHLORIDE 0.9% FLUSH: 10.0000 mL | INTRAVENOUS | Status: DC | PRN

## 2022-04-17 MED ORDER — CHLORHEXIDINE GLUCONATE CLOTH 2 % EX PADS
6.0000 | MEDICATED_PAD | Freq: Every day | CUTANEOUS | Status: DC
  Administered 2022-04-17 – 2022-04-21 (×5): 6 via TOPICAL

## 2022-04-17 MED ORDER — SODIUM CHLORIDE 0.9% FLUSH
10.0000 mL | Freq: Two times a day (BID) | INTRAVENOUS | Status: DC
  Administered 2022-04-17 – 2022-04-23 (×7): 10 mL

## 2022-04-17 MED ORDER — TRAMADOL HCL 50 MG PO TABS: 50.0000 mg | ORAL_TABLET | Freq: Once | ORAL | Status: DC

## 2022-04-17 MED ORDER — TETRACAINE HCL 1 % IJ SOLN
5.0000 mg | Freq: Once | INTRAMUSCULAR | Status: DC
  Filled 2022-04-17: qty 2

## 2022-04-17 MED FILL — Heparin Sod (Porcine)-NaCl IV Soln 1000 Unit/500ML-0.9%: INTRAVENOUS | Qty: 1000 | Status: AC

## 2022-04-17 MED FILL — Lidocaine HCl Local Preservative Free (PF) Inj 1%: INTRAMUSCULAR | Qty: 30 | Status: AC

## 2022-04-17 MED FILL — Verapamil HCl IV Soln 2.5 MG/ML: INTRAVENOUS | Qty: 2 | Status: AC

## 2022-04-17 NOTE — Progress Notes (Signed)
-  2.5L UOP today, CVP 2 (Personally checked) . Vitals stable, patient asymptomatic.    Discussed w/ Dr. Aundra Dubin. Will d/c further doses of lasix for today. Will reevaluate tomorrow morning.   Earnie Larsson, AGACNP-BC  04/17/22 15:15PM

## 2022-04-17 NOTE — Evaluation (Signed)
Physical Therapy Evaluation Patient Details Name: Luis [name] MRN: [id number] DOB: [birth year] Today's Date: 04/17/2022  History of Present Illness  Pt is 51 yo male who returns to [facility] hospital on 04/16/2022. Pt underwent scheduled heart cath which demonstrated elevated filling pressures and low output. Pt directed from cath lab to floor for IV diuresis and inotropic support. PMH includes HTN, HLD, DMII, OSA, cocaine abuse, VT s/p ICD.  Clinical Impression  Pt presents to PT with deficits in endurance, gait, balance, cardiopulmonary function. Pt is limited by SOB and lightheadedness when mobilizing. Pt will benefit from aggressive ambulation in an effort to improve activity tolerance. PT recommends the pt ambulate out of the room at least 3 times daily. PT recommends discharge home with HHPT when medically appropriate.       Recommendations for follow up therapy are one component of a multi-disciplinary discharge planning process, led by the attending physician.  Recommendations may be updated based on patient status, additional functional criteria and insurance authorization.  Follow Up Recommendations Home health PT      Assistance Recommended at Discharge PRN  Patient can return home with the following  A little help with bathing/dressing/bathroom;Assistance with cooking/housework;Assist for transportation    Equipment Recommendations None recommended by PT  Recommendations for Other Services       Functional Status Assessment Patient has had a recent decline in their functional status and demonstrates the ability to make significant improvements in function in a reasonable and predictable amount of time.     Precautions / Restrictions Precautions Precautions: Fall;Other (comment) Precaution Comments: monitor sats Restrictions Weight Bearing Restrictions: No      Mobility  Bed Mobility Overal bed mobility: Modified Independent                   Transfers Overall transfer level: Modified independent                      Ambulation/Gait Ambulation/Gait assistance: Supervision Gait Distance (Feet): 150 Feet (75' x 2, seated rest break due to dizziness) Assistive device: Rolling walker (2 wheels) Gait Pattern/deviations: Step-through pattern Gait velocity: reduced Gait velocity interpretation: <1.8 ft/sec, indicate of risk for recurrent falls   General Gait Details: slowed step-through gait  Stairs            Wheelchair Mobility    Modified Rankin (Stroke Patients Only)       Balance Overall balance assessment: Needs assistance Sitting-balance support: No upper extremity supported, Feet supported Sitting balance-Leahy Scale: Good     Standing balance support: No upper extremity supported, During functional activity Standing balance-Leahy Scale: Fair                               Pertinent Vitals/Pain Pain Assessment Pain Assessment: No/denies pain    Home Living Family/patient expects to be discharged to:: Private residence Living Arrangements: Alone Available Help at Discharge: Other (Comment) (fiance visits from [facility] on weekends, otherwise no caregiver support is available during the week) Type of Home: Apartment Home Access: Level entry       Home Layout: One level Home Equipment: Adaptive equipment;Rolling Walker (2 wheels) Additional Comments: pt reports he and his fiance have delayed theur wedding due to his ongoing health issues    Prior Function Prior Level of Function : Needs assist             Mobility Comments: ambulates household distances ADLs  Comments: difficulty performing ADLs like lower body dressing, cooking, cleaning, bathing     Hand Dominance   Dominant Hand: Right    Extremity/Trunk Assessment   Upper Extremity Assessment Upper Extremity Assessment: Overall WFL for tasks assessed    Lower Extremity Assessment Lower Extremity Assessment:  Generalized weakness    Cervical / Trunk Assessment Cervical / Trunk Assessment: Other exceptions Cervical / Trunk Exceptions: morbid obesity  Communication   Communication: No difficulties  Cognition Arousal/Alertness: Awake/alert Behavior During Therapy: WFL for tasks assessed/performed Overall Cognitive Status: Within Functional Limits for tasks assessed                                          General Comments General comments (skin integrity, edema, etc.): pt on 2L [place], reports SOB when mobilizing, pt not on SpO2 monitor    Exercises     Assessment/Plan    PT Assessment Patient needs continued PT services  PT Problem List Decreased activity tolerance;Decreased balance;Decreased mobility;Decreased strength;Cardiopulmonary status limiting activity       PT Treatment Interventions DME instruction;Gait training;Functional mobility training;Therapeutic activities;Therapeutic exercise;Balance training;Neuromuscular re-education;Patient/family education    PT Goals (Current goals can be found in the Care Plan section)  Acute Rehab PT Goals Patient Stated Goal: to improve heart function and stay at home, stop returning to hospital PT Goal Formulation: With patient Time For Goal Achievement: 05/01/22 Potential to Achieve Goals: Fair Additional Goals Additional Goal #1: Pt will score >19/24 on the DGI to indicate a reduced risk for falls Additional Goal #2: Pt will report 1/4 DOE or less when ambulating for >250' to demonstrate improved activity tolerance    Frequency Min 3X/week     Co-evaluation               AM-PAC PT "6 Clicks" Mobility  Outcome Measure Help needed turning from your back to your side while in a flat bed without using bedrails?: None Help needed moving from lying on your back to sitting on the side of a flat bed without using bedrails?: None Help needed moving to and from a bed to a chair (including a wheelchair)?: None Help  needed standing up from a chair using your arms (e.g., wheelchair or bedside chair)?: None Help needed to walk in hospital room?: A Little Help needed climbing 3-5 steps with a railing? : A Little 6 Click Score: 22    End of Session Equipment Utilized During Treatment: Oxygen Activity Tolerance: Patient limited by fatigue Patient left: in bed;with call bell/phone within reach Nurse Communication: Mobility status PT Visit Diagnosis: Other abnormalities of gait and mobility (R26.89)    Time: [id number] PT Time Calculation (min) (ACUTE ONLY): 15 min   Charges:   PT Evaluation $PT Eval Low Complexity: Deaver, PT, DPT Acute Rehabilitation Office [phone]   Zenaida Niece 04/17/2022, 9:59 AM

## 2022-04-17 NOTE — Progress Notes (Addendum)
Advanced Heart Failure Rounding Note  PCP-Cardiologist: Glenetta Hew, MD   Subjective:   10/19 Cath- Elevated filling pressures + low output. Started on milrinone and diuresing with IV lasix.   Brisk diuresis noted.  Milrinone 0.25 mcg . PICC delayed due to lidocaine allergy.   Complaining of a cough. Slept a little better.    Objective:   Weight Range: (!) 136.3 kg Body mass index is 48.5 kg/m.   Vital Signs:   Temp:  [97 F (36.1 C)-98.8 F (37.1 C)] 97.9 F (36.6 C) (10/20 0713) Pulse Rate:  [73-89] 83 (10/20 0713) Resp:  [15-30] 20 (10/20 0713) BP: (98-143)/(43-92) 112/69 (10/20 0713) SpO2:  [91 %-100 %] 91 % (10/20 0713) Weight:  [135.9 kg-142.9 kg] 136.3 kg (10/20 0410) Last BM Date : 04/16/22  Weight change: Filed Weights   04/16/22 1145 04/16/22 1700 04/17/22 0410  Weight: (!) 142.9 kg 135.9 kg (!) 136.3 kg    Intake/Output:   Intake/Output Summary (Last 24 hours) at 04/17/2022 0854 Last data filed at 04/17/2022 0417 Gross per 24 hour  Intake 618.79 ml  Output 4455 ml  Net -3836.21 ml      Physical Exam    General:   In bed. No resp difficulty HEENT: Normal Neck: Supple. JVP difficult to assess due to body habitus  . Carotids 2+ bilat; no bruits. No lymphadenopathy or thyromegaly appreciated. Cor: PMI nondisplaced. Regular rate & rhythm. No rubs, gallops or murmurs. Lungs: Decreased in the bases.  Abdomen: Soft, nontender, nondistended. No hepatosplenomegaly. No bruits or masses. Good bowel sounds. Extremities: No cyanosis, clubbing, rash, R and LLE 2+ edema Neuro: Alert & orientedx3, cranial nerves grossly intact. moves all 4 extremities w/o difficulty. Affect pleasant   Telemetry   SR 70-80s   EKG    N/A  Labs    CBC Recent Labs    04/14/22 1616 04/16/22 1355 04/16/22 1356 04/16/22 1808  WBC 10.5  --   --  8.7  HGB 13.8   < > 12.9* 14.5  HCT 43.8   < > 38.0* 44.1  MCV 91.4  --   --  88.0  PLT 238  --   --  250   < > =  values in this interval not displayed.   Basic Metabolic Panel Recent Labs    04/16/22 1808 04/17/22 0033  NA 139 136  K 4.2 4.0  CL 104 102  CO2 26 24  GLUCOSE 396* 303*  BUN 12 14  CREATININE 1.10 1.17  CALCIUM 8.6* 8.8*   Liver Function Tests Recent Labs    04/14/22 1616 04/16/22 1808  AST 24 20  ALT 17 18  ALKPHOS 42 39  BILITOT 0.2* 0.3  PROT 5.4* 6.4*  ALBUMIN 3.1* 3.3*   No results for input(s): "LIPASE", "AMYLASE" in the last 72 hours. Cardiac Enzymes No results for input(s): "CKTOTAL", "CKMB", "CKMBINDEX", "TROPONINI" in the last 72 hours.  BNP: BNP (last 3 results) Recent Labs    04/07/22 1607 04/14/22 1616 04/16/22 1808  BNP 180.1* 161.9* 336.9*    ProBNP (last 3 results) No results for input(s): "PROBNP" in the last 8760 hours.   D-Dimer No results for input(s): "DDIMER" in the last 72 hours. Hemoglobin A1C No results for input(s): "HGBA1C" in the last 72 hours. Fasting Lipid Panel No results for input(s): "CHOL", "HDL", "LDLCALC", "TRIG", "CHOLHDL", "LDLDIRECT" in the last 72 hours. Thyroid Function Tests No results for input(s): "TSH", "T4TOTAL", "T3FREE", "THYROIDAB" in the last 72 hours.  Invalid input(s): "FREET3"  Other results:   Imaging    DG Chest 2 View  Result Date: 04/17/2022 CLINICAL DATA:  Congestive heart failure. EXAM: CHEST - 2 VIEW COMPARISON:  AP chest 04/07/2022 FINDINGS: Left chest wall cardiac AICD is again seen with leads overlying the right atrium and right ventricle. Cardiac silhouette is again moderately enlarged. Mediastinal contours are within normal limits for AP technique and low lung volumes. Moderately decreased lung volumes. Increased bibasilar horizontal linear bronchovascular crowding. No definite pleural effusion. No pneumothorax. Mild multilevel disc space narrowing and moderate anterior bridging degenerative osteophytes of the thoracic spine. IMPRESSION: Moderately decreased lung volumes with increased  bibasilar atelectasis. No definite pulmonary edema. Electronically Signed   By: Yvonne Kendall M.D.   On: 04/17/2022 08:27   CARDIAC CATHETERIZATION  Result Date: 04/16/2022 1. No significant CAD, nonischemic cardiomyopathy. 2. Significantly elevated right and left heart filling pressures. 3. Low cardiac output. 4. Mild pulmonary venous hypertension.   [place] EKG SITE RITE  Result Date: 04/16/2022 If Site Rite image not attached, placement could not be confirmed due to current cardiac rhythm.    Medications:     Scheduled Medications:  carvedilol  3.125 mg Oral BID WC   clopidogrel  75 mg Oral Daily   dapagliflozin propanediol  10 mg Oral Daily   digoxin  0.125 mg Oral Daily   DULoxetine  90 mg Oral Daily   enoxaparin (LOVENOX) injection  40 mg Subcutaneous Q24H   furosemide  80 mg Intravenous BID   insulin aspart  0-15 Units Subcutaneous TID AC & HS   insulin glargine-yfgn  50 Units Subcutaneous BID   pantoprazole  40 mg Oral Daily   potassium chloride SA  20 mEq Oral Daily   prazosin  2 mg Oral QHS   rosuvastatin  20 mg Oral Daily   sacubitril-valsartan  1 tablet Oral BID   sodium chloride flush  3 mL Intravenous Q12H   spironolactone  25 mg Oral Daily   tetracaine  5 mg Intradermal Once   traZODone  200 mg Oral QHS    Infusions:  sodium chloride     milrinone 0.25 mcg/kg/min (04/17/22 0339)    PRN Medications: sodium chloride, acetaminophen, guaiFENesin-dextromethorphan, hydrOXYzine, ondansetron (ZOFRAN) IV, mouth rinse, sodium chloride flush    Patient Profile   51 y.o. male with h/o chronic systolic heart failure, VT s/p ICD (MDT CRT-D), HTN, HLD, Type 2 DM, OSA on CPAP and h/o substance use (cocaine) referred for [facility] which demonstrated severe NICM w/ elevated filling pressures and low output. Direct admitted from cath lab for IV diuretics and initiation of inotropic support w/ milrinone.   Assessment/Plan   1. Acute on Chronic Systolic CHF:  MDT CRT-D device.   Echoes 2021-2023, LVEF range 25-35%, RV mildly reduced.  Reportedly diagnosed with nonischemic cardiomyopathy in 2012 in [place], etiology felt due to cocaine abuse.  [place] cardiology 03/07/2019 notes mention patient has CAD with history of angioplasty in the past. Unable to locate cath reports/ details regarding coronary anatomy in record. Most recent echo in 2/23 showed EF 25%, mild LV dilation, mildly decreased RV function. RHC in 9/23 showed low CI at 1.82.  Ongoing NYHA class IIIb symptoms. Repeat RHC done today showed elevated filling pressures and low output, mRA 14, PAP 45/22, mPCWP 27, LVEDP 32, PA sat 60%, CI 1.77. LHC showed no significant coronary disease.  - PICC line later today, follow CVP and co-ox.  - Continue milrinone 0.25 mcg.  - Brisk  diuresis from IV lasix. Continue lasix 80 mg twice a day.  - Continue Entresto 49-51 mg bid. BP soft. No room to increase.  - Continue Farxiga 10 mg daily---> watch with fungal infection.  - Continue Spironolactone 25 mg daily   - Continue Coreg to 3.125 mg bid given low output  - Continue Digoxin 0.125 mg daily, check level - Suspect that with OHS/OSA, obesity, and his poor mobility that he will be a poor candidate for LVAD/transplant.  2. CAD: Documented in Care Everywhere, reported h/o angioplasty, details unknown (cannot find a cath report though prior CAD is referenced).  He has had atypical chest pain. LHC done today showed no significant CAD - on Plavix 75 mg daily, has ASA allergy.   - Continue Crestor 20 mg daily.  3. H/o VT: Has CRT-D w/ chronic systolic heart failure and QRS > 150 ms  - followed by [facility] device clinic  4. HTN: BP controlled.  - continue GDMT per above  5. Type 2DM - Hgb A1C >9. Consult diabetes coordinator.  - continue Insulin per PCP  - now on Farxiga. As above watch with fungal infection. Add antifungal powder.  6. Cocaine: Says he has quit for months now.    7. Depression: Depressed due to lack of social/family  support and food insecurity  - continue Cymbalta  8. Jehovah's Witness.  9. OHS/OSA: He uses oxygen during the day and OSA at night.  This likely plays a role in his dyspnea.  10. Yeast infection under pannus: He will use Nystatin.  - monitor w/ Farxiga   Consult cardiac rehab and diabetes coordinator.  SDOH- Consult TOC. Needs help food stamps and disability.    Length of Stay: [place], NP  04/17/2022, 8:54 AM  Advanced Heart Failure Team Pager [phone] (M-F; 7a - 5p)  Please contact [facility] for night-coverage after hours (5p -7a ) and weekends on [email or web address]  Patient seen with NP, agree with the above note.   Good diuresis yesterday after starting milrinone 0.25.  I/Os net negative 3836.  Creatinine stable 1.17.  No PICC yet.   Breathing better but still orthopneic.   General: NAD Neck: Difficult but suspect JVP 12-14, no thyromegaly or thyroid nodule.  Lungs: Clear to auscultation bilaterally with normal respiratory effort. CV: Nondisplaced PMI.  Heart regular S1/S2, no S3/S4, no murmur.  1+ edema 3/4 to knees bilaterally.  Abdomen: Soft, nontender, no hepatosplenomegaly, no distention.  Skin: Intact without lesions or rashes.  Neurologic: Alert and oriented x 3.  Psych: Normal affect. Extremities: No clubbing or cyanosis.  HEENT: Normal.   Still volume overloaded by exam and by symptoms though OHS/OSA also plays role in dyspnea.  - PICC today, follow CVP and co-ox.  - Continue milrinone 0.25 + Lasix 80 mg IV bid.  - Continue his other cardiac meds without change.   Pannus yeast infection, start Nystatin.   Poor control of DM, consult diabetes coordinator.   Continue Plavix due to ASA allergy.   Mobilize with PT/cardiac rehab.   Loralie Champagne 04/17/2022 9:21 AM

## 2022-04-17 NOTE — Progress Notes (Signed)
Peripherally Inserted Central Catheter Placement  The IV Nurse has discussed with the patient and/or persons authorized to consent for the patient, the purpose of this procedure and the potential benefits and risks involved with this procedure.  The benefits include less needle sticks, lab draws from the catheter, and the patient may be discharged home with the catheter. Risks include, but not limited to, infection, bleeding, blood clot (thrombus formation), and puncture of an artery; nerve damage and irregular heartbeat and possibility to perform a PICC exchange if needed/ordered by physician.  Alternatives to this procedure were also discussed.  Bard Power PICC patient education guide, fact sheet on infection prevention and patient information card has been provided to patient /or left at bedside.    PICC Placement Documentation  PICC Double Lumen 26/94/85 Right Basilic 40 cm 0 cm (Active)  Indication for Insertion or Continuance of Line Vasoactive infusions 04/17/22 1158  Exposed Catheter (cm) 0 cm 04/17/22 1158  Site Assessment Clean, Dry, Intact 04/17/22 1158  Lumen #1 Status Flushed;Saline locked;Blood return noted 04/17/22 1158  Lumen #2 Status Flushed;Saline locked;Blood return noted 04/17/22 1158  Dressing Type Transparent;Securing device 04/17/22 1158  Dressing Status Antimicrobial disc in place 04/17/22 1158  Dressing Intervention New dressing;Other (Comment) 04/17/22 1158  Dressing Change Due 04/24/22 04/17/22 1158       Luis [name] 04/17/2022, 11:58 AM

## 2022-04-17 NOTE — Inpatient Diabetes Management (Signed)
Inpatient Diabetes Program Recommendations  AACE/ADA: New Consensus Statement on Inpatient Glycemic Control (2015)  Target Ranges:  Prepandial:   less than 140 mg/dL      Peak postprandial:   less than 180 mg/dL (1-2 hours)      Critically ill patients:  140 - 180 mg/dL   Lab Results  Component Value Date   GLUCAP 224 (H) 04/17/2022   HGBA1C 9.1 (H) 02/26/2022    Review of Glycemic Control  Diabetes history: type 2 Outpatient Diabetes medications: Humalog 75/25 insulin 70 units TID (with meals), Trulicity 1.5 mg weekly, Farxiga 10 mg daily Current orders for Inpatient glycemic control: Semglee 50 units BID, Novolog 0-15 units TID & HS correction scale.  Inpatient Diabetes Program Recommendations:   Spoke with patient on the phone. States that he takes Humalog 75/25 insulin 70 units three times per day (with meals). He has been on this dose about 3 years. He does not take Lantus. Has a CGM and his blood sugars usually run around 120-200 mg/dl depending on what he is eating. He last saw his PCP about 2 weeks ago and plans to follow up with PCP after discharge.   Recommend that he follow up with his PCP. Continue on his same insulin regimen at home. Agree with current insulin orders for Semglee 50 units BID and Novolog correction scale while in the hospital.  Will continue to monitor blood sugars while in the hospital.  Harvel Ricks RN BSN CDE Diabetes Coordinator Pager: [phone]  8am-5pm

## 2022-04-17 NOTE — Progress Notes (Signed)
Coox labs currently showing up at the 0500 time for 04/17/22 in the results view are from the coox drawn at 1300 on 04/17/22.  Had to wait for PICC line placement.

## 2022-04-17 NOTE — Progress Notes (Signed)
OT Cancellation Note  Patient Details Name: Luis [name] MRN: [id number] DOB: 04/21/1971   Cancelled Treatment:    Reason Eval/Treat Not Completed: Other (comment). Pt with CPAP on and reporting he is very sleepy. Politely requests we come back tomorrow.   [place], OTR/L [facility] [phone] Office [phone]    Almon Register 04/17/2022, 2:33 PM

## 2022-04-18 ENCOUNTER — Encounter (HOSPITAL_COMMUNITY): Payer: Self-pay | Admitting: Cardiology

## 2022-04-18 DIAGNOSIS — I5023 Acute on chronic systolic (congestive) heart failure: Secondary | ICD-10-CM | POA: Diagnosis not present

## 2022-04-18 LAB — BASIC METABOLIC PANEL
Anion gap: 7 (ref 5–15)
BUN: 16 mg/dL (ref 6–20)
CO2: 26 mmol/L (ref 22–32)
Calcium: 8.1 mg/dL — ABNORMAL LOW (ref 8.9–10.3)
Chloride: 105 mmol/L (ref 98–111)
Creatinine, Ser: 1.06 mg/dL (ref 0.61–1.24)
GFR, Estimated: >60 mL/min (ref >60)
Glucose, Bld: 215 mg/dL — ABNORMAL HIGH (ref 70–99)
Potassium: 3.8 mmol/L (ref 3.5–5.1)
Sodium: 138 mmol/L (ref 135–145)

## 2022-04-18 LAB — GLUCOSE, CAPILLARY
Glucose-Capillary: 199 mg/dL — ABNORMAL HIGH (ref 70–99)
Glucose-Capillary: 199 mg/dL — ABNORMAL HIGH (ref 70–99)
Glucose-Capillary: 196 mg/dL — ABNORMAL HIGH (ref 70–99)
Glucose-Capillary: 235 mg/dL — ABNORMAL HIGH (ref 70–99)

## 2022-04-18 LAB — COOXEMETRY PANEL
Carboxyhemoglobin: 1.4 % (ref 0.5–1.5)
Methemoglobin: <0.7 % (ref 0.0–1.5)
O2 Saturation: 74.3 %
Total hemoglobin: 13 g/dL (ref 12.0–16.0)

## 2022-04-18 MED ORDER — TORSEMIDE 20 MG PO TABS
60.0000 mg | ORAL_TABLET | Freq: Every day | ORAL | Status: DC
  Administered 2022-04-18 – 2022-04-19 (×2): 60 mg via ORAL
  Filled 2022-04-18 (×2): qty 3

## 2022-04-18 MED ORDER — MILRINONE LACTATE IN DEXTROSE 20-5 MG/100ML-% IV SOLN
0.1250 ug/kg/min | INTRAVENOUS | Status: DC
  Administered 2022-04-18: 0.125 ug/kg/min via INTRAVENOUS
  Filled 2022-04-18: qty 100

## 2022-04-18 MED ORDER — MILRINONE LACTATE IN DEXTROSE 20-5 MG/100ML-% IV SOLN: 0.1250 ug/kg/min | INTRAVENOUS | Status: DC

## 2022-04-18 MED ORDER — OXYCODONE HCL 5 MG PO TABS
5.0000 mg | ORAL_TABLET | Freq: Four times a day (QID) | ORAL | Status: DC | PRN
  Administered 2022-04-18 – 2022-04-23 (×15): 5 mg via ORAL
  Filled 2022-04-18 (×14): qty 1

## 2022-04-18 NOTE — Progress Notes (Signed)
Pt resting comfortably on CPAP at this time. Will continue to monitor.

## 2022-04-18 NOTE — Progress Notes (Addendum)
Patient ID: Luis [name], male   DOB: 1971-06-06, 51 y.o.   MRN: [id number]     Advanced Heart Failure Rounding Note  PCP-Cardiologist: Glenetta Hew, MD   Subjective:    10/19 Cath- Elevated filling pressures + low output. Started on milrinone and diuresing with IV lasix.   Good diuresis yesterday again, weight down 6 lbs.  He is off IV Lasix.  He remains on milrinone 0.25 with co-ox 74%.  CVP 7 this morning.   Reports pain in right forearm at cath site, no dyspnea.   Objective:   Weight Range: 133.7 kg Body mass index is 47.57 kg/m.   Vital Signs:   Temp:  [97.8 F (36.6 C)-98.7 F (37.1 C)] 97.9 F (36.6 C) (10/21 0700) Pulse Rate:  [74-88] 88 (10/21 0700) Resp:  [20] 20 (10/21 0700) BP: (90-115)/(48-63) 107/60 (10/21 0700) SpO2:  [95 %-98 %] 98 % (10/21 0700) Weight:  [133.7 kg] 133.7 kg (10/21 0343) Last BM Date : 04/18/22  Weight change: Filed Weights   04/16/22 1700 04/17/22 0410 04/18/22 0343  Weight: 135.9 kg (!) 136.3 kg 133.7 kg    Intake/Output:   Intake/Output Summary (Last 24 hours) at 04/18/2022 0839 Last data filed at 04/18/2022 0817 Gross per 24 hour  Intake 2317.71 ml  Output 4400 ml  Net -2082.29 ml      Physical Exam    General: NAD Neck: No JVD, no thyromegaly or thyroid nodule.  Lungs: Clear to auscultation bilaterally with normal respiratory effort. CV: Nondisplaced PMI.  Heart regular S1/S2, no S3/S4, no murmur.  Trace ankle edema.   Abdomen: Soft, nontender, no hepatosplenomegaly, no distention.  Skin: Intact without lesions or rashes.  Neurologic: Alert and oriented x 3.  Psych: Normal affect. Extremities: No clubbing or cyanosis. Small ecchymosis right ulnar cath site.  HEENT: Normal.    Telemetry   SR 70-80s (personally reviewed)  EKG    N/A  Labs    CBC Recent Labs    04/16/22 1356 04/16/22 1808  WBC  --  8.7  HGB 12.9* 14.5  HCT 38.0* 44.1  MCV  --  88.0  PLT  --  [id number]   Basic Metabolic  Panel Recent Labs    04/17/22 0033 04/18/22 0505  NA 136 138  K 4.0 3.8  CL 102 105  CO2 24 26  GLUCOSE 303* 215*  BUN 14 16  CREATININE 1.17 1.06  CALCIUM 8.8* 8.1*   Liver Function Tests Recent Labs    04/16/22 1808  AST 20  ALT 18  ALKPHOS 39  BILITOT 0.3  PROT 6.4*  ALBUMIN 3.3*   No results for input(s): "LIPASE", "AMYLASE" in the last 72 hours. Cardiac Enzymes No results for input(s): "CKTOTAL", "CKMB", "CKMBINDEX", "TROPONINI" in the last 72 hours.  BNP: BNP (last 3 results) Recent Labs    04/07/22 1607 04/14/22 1616 04/16/22 1808  BNP 180.1* 161.9* 336.9*    ProBNP (last 3 results) No results for input(s): "PROBNP" in the last 8760 hours.   D-Dimer No results for input(s): "DDIMER" in the last 72 hours. Hemoglobin A1C No results for input(s): "HGBA1C" in the last 72 hours. Fasting Lipid Panel No results for input(s): "CHOL", "HDL", "LDLCALC", "TRIG", "CHOLHDL", "LDLDIRECT" in the last 72 hours. Thyroid Function Tests No results for input(s): "TSH", "T4TOTAL", "T3FREE", "THYROIDAB" in the last 72 hours.  Invalid input(s): "FREET3"  Other results:   Imaging    No results found.   Medications:     Scheduled Medications:  carvedilol  3.125 mg Oral BID WC   Chlorhexidine Gluconate Cloth  6 each Topical Daily   clopidogrel  75 mg Oral Daily   dapagliflozin propanediol  10 mg Oral Daily   digoxin  0.125 mg Oral Daily   DULoxetine  90 mg Oral Daily   enoxaparin (LOVENOX) injection  40 mg Subcutaneous Q24H   insulin aspart  0-15 Units Subcutaneous TID AC & HS   insulin glargine-yfgn  50 Units Subcutaneous BID   nystatin cream   Topical BID   pantoprazole  40 mg Oral Daily   potassium chloride SA  20 mEq Oral Daily   prazosin  2 mg Oral QHS   rosuvastatin  20 mg Oral Daily   sacubitril-valsartan  1 tablet Oral BID   sodium chloride flush  10-40 mL Intracatheter Q12H   sodium chloride flush  3 mL Intravenous Q12H   spironolactone  25 mg  Oral Daily   tetracaine  5 mg Intradermal Once   tetracaine  5 mg Intradermal Once   traZODone  200 mg Oral QHS    Infusions:  sodium chloride     milrinone 0.25 mcg/kg/min (04/18/22 0509)    PRN Medications: sodium chloride, acetaminophen, guaiFENesin-dextromethorphan, hydrOXYzine, ondansetron (ZOFRAN) IV, mouth rinse, sodium chloride flush, sodium chloride flush    Patient Profile   51 y.o. male with h/o chronic systolic heart failure, VT s/p ICD (MDT CRT-D), HTN, HLD, Type 2 DM, OSA on CPAP and h/o substance use (cocaine) referred for [facility] which demonstrated severe NICM w/ elevated filling pressures and low output. Direct admitted from cath lab for IV diuretics and initiation of inotropic support w/ milrinone.   Assessment/Plan   1. Acute on Chronic Systolic CHF:  Nonischemic cardiomyopathy.  MDT CRT-D device.  Echoes 2021-2023, LVEF range 25-35%, RV mildly reduced.  Reportedly diagnosed with nonischemic cardiomyopathy in 2012 in [place], etiology felt due to cocaine abuse.  [place] cardiology 03/07/2019 notes mention patient has CAD with history of angioplasty in the past. Unable to locate cath reports/ details regarding coronary anatomy in record. Most recent echo in 2/23 showed EF 25%, mild LV dilation, mildly decreased RV function. RHC in 9/23 showed low CI at 1.82.  Ongoing NYHA class IIIb symptoms. Repeat RHC done this admission showed elevated filling pressures and low output, mRA 14, PAP 45/22, mPCWP 27, LVEDP 32, PA sat 60%, CI 1.77. LHC showed no significant coronary disease. Good diuresis with weight down.  CVP 7, co-ox 74%.  On milrinone 0.25.  - Decrease milrinone to 0.125, hopefully stop tomorrow.   - Restart torsemide at 80 mg daily.  - Continue Entresto 49-51 mg bid.  - Continue Farxiga 10 mg daily---> watch with fungal infection.  - Continue Spironolactone 25 mg daily   - Continue Coreg to 3.125 mg bid - Continue Digoxin 0.125 mg daily, level low yesterday.  -  Suspect that with OHS/OSA, obesity, and his poor mobility that he will be a poor candidate for LVAD/transplant.  2. CAD: Documented in Care Everywhere, reported h/o angioplasty, details unknown (cannot find a cath report though prior CAD is referenced).  He has had atypical chest pain. LHC done today showed no significant CAD - on Plavix 75 mg daily, has ASA allergy.   - Continue Crestor 20 mg daily.  3. H/o VT: Has CRT-D w/ chronic systolic heart failure and QRS > 150 ms  - followed by [facility] device clinic  4. HTN: BP controlled.  - continue GDMT per above  5.  Type 2DM - Hgb A1C >9.  - continue Semglee 50 units [place] bid.   - now on Farxiga. As above watch with fungal infection. Added antifungal powder.  6. Cocaine: Says he has quit for months now.    7. Depression: Depressed due to lack of social/family support and food insecurity  - continue Cymbalta  8. Jehovah's Witness.  9. OHS/OSA: He uses oxygen during the day and OSA at night.  This likely plays a role in his dyspnea.  10. Yeast infection under pannus: He will use Nystatin.  - monitor w/ Farxiga   Consult cardiac rehab and diabetes coordinator.  SDOH- Consult TOC. Needs help food stamps and disability.   Mobilize.    Loralie Champagne 04/18/2022 8:39 AM

## 2022-04-18 NOTE — Progress Notes (Signed)
Notified provider Grandville Silos of refusal of lovenox as well as patient request for pain meds due to pain in R arm. Orders received

## 2022-04-18 NOTE — Evaluation (Signed)
Occupational Therapy Evaluation Patient Details Name: Luis [name] MRN: [id number] DOB: 1970/07/04 Today's Date: 04/18/2022   History of Present Illness Pt is 51 yo male who returns to [facility] hospital on 04/16/2022. Pt underwent scheduled heart cath which demonstrated elevated filling pressures and low output. Pt directed from cath lab to floor for IV diuresis and inotropic support. PMH includes HTN, HLD, DMII, OSA, cocaine abuse, VT s/p ICD.   Clinical Impression   Nahum was evaluated s/p the above admission list, he is typically mod I at baseline with 4 recent falls and limited to household distanced. Upon evaluation he demonstrated functional limitations due to generalized weakness, poor activity tolerance and self-limited demeanor. He required maximal encouragement to participate, however demonstrated mod I bed mobility and transfers and ambulated hallway distance with RW and supervision A. He did require 1 sitting rest break. VSS on 3L. Due to deficits listed below, he does need up to min A for LB ADLs, he does have AE at home to increased indep levels. OT to continue to follow. Recommend d/c to home with [facility].    Recommendations for follow up therapy are one component of a multi-disciplinary discharge planning process, led by the attending physician.  Recommendations may be updated based on patient status, additional functional criteria and insurance authorization.   Follow Up Recommendations  Home health OT    Assistance Recommended at Discharge PRN  Patient can return home with the following Assist for transportation;Assistance with cooking/housework;A little help with bathing/dressing/bathroom;A little help with walking and/or transfers    Functional Status Assessment  Patient has had a recent decline in their functional status and demonstrates the ability to make significant improvements in function in a reasonable and predictable amount of time.        Precautions /  Restrictions Precautions Precautions: Fall;Other (comment) Precaution Comments: monitor sats Restrictions Weight Bearing Restrictions: No      Mobility Bed Mobility Overal bed mobility: Modified Independent                  Transfers Overall transfer level: Modified independent Equipment used: Rolling walker (2 wheels)               General transfer comment: encouragement needed for motivation      Balance Overall balance assessment: Needs assistance Sitting-balance support: No upper extremity supported, Feet supported Sitting balance-Leahy Scale: Good     Standing balance support: Bilateral upper extremity supported, During functional activity Standing balance-Leahy Scale: Poor                             ADL either performed or assessed with clinical judgement   ADL Overall ADL's : Needs assistance/impaired Eating/Feeding: Independent;Sitting   Grooming: Supervision/safety;Standing   Upper Body Bathing: Set up;Sitting   Lower Body Bathing: Minimal assistance;Sit to/from stand   Upper Body Dressing : Set up;Sitting   Lower Body Dressing: Minimal assistance;Sit to/from stand Lower Body Dressing Details (indicate cue type and reason): has AE at home to increase indep Toilet Transfer: Supervision/safety;Ambulation;Rolling walker (2 wheels)   Toileting- Clothing Manipulation and Hygiene: Supervision/safety;Sitting/lateral lean       Functional mobility during ADLs: Supervision/safety;Rolling walker (2 wheels) General ADL Comments: limited by activity tolerance     Vision Baseline Vision/History: 1 Wears glasses Vision Assessment?: No apparent visual deficits     Perception     Praxis      Pertinent Vitals/Pain Pain Assessment Pain Assessment: Faces Faces  Pain Scale: Hurts a little bit Pain Location: generalized wtih movement Pain Descriptors / Indicators: Discomfort, Grimacing, Guarding Pain Intervention(s): Limited activity  within patient's tolerance     Hand Dominance Right   Extremity/Trunk Assessment Upper Extremity Assessment Upper Extremity Assessment: Overall WFL for tasks assessed   Lower Extremity Assessment Lower Extremity Assessment: Generalized weakness   Cervical / Trunk Assessment Cervical / Trunk Assessment: Other exceptions Cervical / Trunk Exceptions: morbid obesity   Communication Communication Communication: No difficulties   Cognition Arousal/Alertness: Awake/alert Behavior During Therapy: WFL for tasks assessed/performed Overall Cognitive Status: Within Functional Limits for tasks assessed                                 General Comments: needs encouragement to participate, self limiting     General Comments  pt on 2L upon arrival, requesting 3L for ambulation. max activity HR 107, SpO2 >92% on [place]    Exercises     Shoulder Instructions      Home Living Family/patient expects to be discharged to:: Private residence Living Arrangements: Alone Available Help at Discharge: Other (Comment) Type of Home: Apartment Home Access: Level entry     Home Layout: One level     Bathroom Shower/Tub: Occupational psychologist: Standard Bathroom Accessibility: Yes How Accessible: Accessible via walker Home Equipment: Adaptive equipment;Rolling Walker (2 wheels) Adaptive Equipment: Sock aid Additional Comments: 2-3L O2 at home      Prior Functioning/Environment Prior Level of Function : Needs assist;History of Falls (last six months)             Mobility Comments: 4 recent falls. ambulates household distances ADLs Comments: difficulty performing ADLs like lower body dressing, cooking, cleaning, bathing (has AE at home)        OT Problem List: Decreased strength;Decreased range of motion;Decreased activity tolerance;Impaired balance (sitting and/or standing);Decreased safety awareness;Decreased knowledge of use of DME or AE;Cardiopulmonary status  limiting activity      OT Treatment/Interventions: Self-care/ADL training;Therapeutic exercise;DME and/or AE instruction;Therapeutic activities;Balance training;Patient/family education;Energy conservation    OT Goals(Current goals can be found in the care plan section) Acute Rehab OT Goals Patient Stated Goal: home OT Goal Formulation: With patient Time For Goal Achievement: 05/02/22 Potential to Achieve Goals: Good ADL Goals Pt Will Perform Grooming: standing;Independently Pt Will Perform Lower Body Bathing: with modified independence;sit to/from stand Pt Will Perform Lower Body Dressing: with modified independence;sit to/from stand Pt Will Transfer to Toilet: with modified independence;ambulating Additional ADL Goal #1: Pt will indep verbalize at least 3 energy conservation strategies to apply to the home setting  OT Frequency: Min 2X/week    Co-evaluation              AM-PAC OT "6 Clicks" Daily Activity     Outcome Measure Help from another person eating meals?: None Help from another person taking care of personal grooming?: A Little Help from another person toileting, which includes using toliet, bedpan, or urinal?: A Little Help from another person bathing (including washing, rinsing, drying)?: A Little Help from another person to put on and taking off regular upper body clothing?: None Help from another person to put on and taking off regular lower body clothing?: A Little 6 Click Score: 20   End of Session Equipment Utilized During Treatment: Rolling walker (2 wheels);Oxygen Nurse Communication: Mobility status  Activity Tolerance: Patient tolerated treatment well Patient left: with call bell/phone within reach;in chair  OT Visit Diagnosis: Unsteadiness on feet (R26.81);Other abnormalities of gait and mobility (R26.89);Muscle weakness (generalized) (M62.81);Repeated falls (R29.6);History of falling (Z91.81)                Time: [id number] OT Time Calculation (min):  20 min Charges:  OT General Charges $OT Visit: 1 Visit OT Evaluation $OT Eval Moderate Complexity: 1 Mod   Amaree Leeper D Causey 04/18/2022, 9:59 AM

## 2022-04-19 DIAGNOSIS — I5023 Acute on chronic systolic (congestive) heart failure: Secondary | ICD-10-CM | POA: Diagnosis not present

## 2022-04-19 LAB — COOXEMETRY PANEL
Carboxyhemoglobin: 1.9 % — ABNORMAL HIGH (ref 0.5–1.5)
Methemoglobin: <0.7 % (ref 0.0–1.5)
O2 Saturation: 66.8 %
Total hemoglobin: 14.6 g/dL (ref 12.0–16.0)

## 2022-04-19 LAB — BASIC METABOLIC PANEL
Anion gap: 12 (ref 5–15)
BUN: 20 mg/dL (ref 6–20)
CO2: 29 mmol/L (ref 22–32)
Calcium: 8.3 mg/dL — ABNORMAL LOW (ref 8.9–10.3)
Chloride: 97 mmol/L — ABNORMAL LOW (ref 98–111)
Creatinine, Ser: 1.58 mg/dL — ABNORMAL HIGH (ref 0.61–1.24)
GFR, Estimated: 52 mL/min — ABNORMAL LOW (ref >60)
Glucose, Bld: 170 mg/dL — ABNORMAL HIGH (ref 70–99)
Potassium: 3.5 mmol/L (ref 3.5–5.1)
Sodium: 138 mmol/L (ref 135–145)

## 2022-04-19 LAB — GLUCOSE, CAPILLARY
Glucose-Capillary: 160 mg/dL — ABNORMAL HIGH (ref 70–99)
Glucose-Capillary: 263 mg/dL — ABNORMAL HIGH (ref 70–99)
Glucose-Capillary: 109 mg/dL — ABNORMAL HIGH (ref 70–99)
Glucose-Capillary: 115 mg/dL — ABNORMAL HIGH (ref 70–99)

## 2022-04-19 LAB — CBC
HCT: 43.8 % (ref 39.0–52.0)
Hemoglobin: 14.6 g/dL (ref 13.0–17.0)
MCH: 28.9 pg (ref 26.0–34.0)
MCHC: 33.3 g/dL (ref 30.0–36.0)
MCV: 86.7 fL (ref 80.0–100.0)
Platelets: 265 10*3/uL (ref 150–400)
RBC: 5.05 MIL/uL (ref 4.22–5.81)
RDW: 13.6 % (ref 11.5–15.5)
WBC: 10.9 10*3/uL — ABNORMAL HIGH (ref 4.0–10.5)
nRBC: 0 % (ref 0.0–0.2)

## 2022-04-19 MED ORDER — MORPHINE SULFATE (PF) 2 MG/ML IV SOLN
1.0000 mg | Freq: Once | INTRAVENOUS | Status: AC
  Administered 2022-04-19: 1 mg via INTRAVENOUS
  Filled 2022-04-19: qty 1

## 2022-04-19 MED ORDER — SODIUM CHLORIDE 0.9 % IV SOLN: INTRAVENOUS | Status: AC

## 2022-04-19 MED ORDER — SACUBITRIL-VALSARTAN 24-26 MG PO TABS: 1.0000 | ORAL_TABLET | Freq: Two times a day (BID) | ORAL | Status: DC

## 2022-04-19 MED ORDER — ALUM & MAG HYDROXIDE-SIMETH 200-200-20 MG/5ML PO SUSP
30.0000 mL | ORAL | Status: DC | PRN
  Administered 2022-04-19: 30 mL via ORAL
  Filled 2022-04-19: qty 30

## 2022-04-19 NOTE — Progress Notes (Addendum)
Patent complaining of 10/10 chest pain, stabbing nature. Ekg acquired.  BP (!) 86/63 (BP Location: Left Arm)   Pulse 95   Temp 98 F (36.7 C) (Oral)   Resp 20   Ht 5\' 6"  (1.676 m)   Wt 133.7 kg   SpO2 94%   BMI 47.57 kg/m .

## 2022-04-19 NOTE — Progress Notes (Signed)
Patient will self place CPAP when ready. RT will continue to monitor.  

## 2022-04-19 NOTE — Progress Notes (Addendum)
Patient ID: Luis [name], male   DOB: 10-Jul-1970, 51 y.o.   MRN: [id number]     Advanced Heart Failure Rounding Note  PCP-Cardiologist: Glenetta Hew, MD   Subjective:    10/19 Cath- Elevated filling pressures + low output. Started on milrinone and diuresing with IV lasix.   Good diuresis yesterday again with torsemide 60 mg, weight down again.  He is on milrinone 0.125 with co-ox 67% and CVP around 5 today.   Reports chest pain overnight, was hard to sleep.   Objective:   Weight Range: 129 kg Body mass index is 45.89 kg/m.   Vital Signs:   Temp:  [97 F (36.1 C)-98 F (36.7 C)] 97 F (36.1 C) (10/22 0800) Pulse Rate:  [53-110] 105 (10/22 0758) Resp:  [20-22] 20 (10/22 0758) BP: (73-137)/(31-87) 90/59 (10/22 0758) SpO2:  [92 %-98 %] 95 % (10/22 0758) Weight:  [129 kg] 129 kg (10/22 0450) Last BM Date : 04/18/22  Weight change: Filed Weights   04/17/22 0410 04/18/22 0343 04/19/22 0450  Weight: (!) 136.3 kg 133.7 kg 129 kg    Intake/Output:   Intake/Output Summary (Last 24 hours) at 04/19/2022 1004 Last data filed at 04/18/2022 2200 Gross per 24 hour  Intake 81.55 ml  Output 5200 ml  Net -5118.45 ml      Physical Exam    General: NAD, obese Neck: No JVD, no thyromegaly or thyroid nodule.  Lungs: Clear to auscultation bilaterally with normal respiratory effort. CV: Nondisplaced PMI.  Heart mildly tachy, regular S1/S2, no S3/S4, no murmur.  No peripheral edema.   Abdomen: Soft, nontender, no hepatosplenomegaly, no distention.  Skin: Intact without lesions or rashes.  Neurologic: Alert and oriented x 3.  Psych: Normal affect. Extremities: No clubbing or cyanosis.  HEENT: Normal.   Telemetry   NSR 90s-100s with BiV pacing (personally reviewed)  EKG    N/A  Labs    CBC Recent Labs    04/16/22 1808 04/19/22 0540  WBC 8.7 10.9*  HGB 14.5 14.6  HCT 44.1 43.8  MCV 88.0 86.7  PLT 250 [id number]   Basic Metabolic Panel Recent Labs     04/18/22 0505 04/19/22 0540  NA 138 138  K 3.8 3.5  CL 105 97*  CO2 26 29  GLUCOSE 215* 170*  BUN 16 20  CREATININE 1.06 1.58*  CALCIUM 8.1* 8.3*   Liver Function Tests Recent Labs    04/16/22 1808  AST 20  ALT 18  ALKPHOS 39  BILITOT 0.3  PROT 6.4*  ALBUMIN 3.3*   No results for input(s): "LIPASE", "AMYLASE" in the last 72 hours. Cardiac Enzymes No results for input(s): "CKTOTAL", "CKMB", "CKMBINDEX", "TROPONINI" in the last 72 hours.  BNP: BNP (last 3 results) Recent Labs    04/07/22 1607 04/14/22 1616 04/16/22 1808  BNP 180.1* 161.9* 336.9*    ProBNP (last 3 results) No results for input(s): "PROBNP" in the last 8760 hours.   D-Dimer No results for input(s): "DDIMER" in the last 72 hours. Hemoglobin A1C No results for input(s): "HGBA1C" in the last 72 hours. Fasting Lipid Panel No results for input(s): "CHOL", "HDL", "LDLCALC", "TRIG", "CHOLHDL", "LDLDIRECT" in the last 72 hours. Thyroid Function Tests No results for input(s): "TSH", "T4TOTAL", "T3FREE", "THYROIDAB" in the last 72 hours.  Invalid input(s): "FREET3"  Other results:   Imaging    No results found.   Medications:     Scheduled Medications:  carvedilol  3.125 mg Oral BID WC   Chlorhexidine Gluconate Cloth  6 each Topical Daily   clopidogrel  75 mg Oral Daily   dapagliflozin propanediol  10 mg Oral Daily   digoxin  0.125 mg Oral Daily   DULoxetine  90 mg Oral Daily   enoxaparin (LOVENOX) injection  40 mg Subcutaneous Q24H   insulin aspart  0-15 Units Subcutaneous TID AC & HS   insulin glargine-yfgn  50 Units Subcutaneous BID   nystatin cream   Topical BID   pantoprazole  40 mg Oral Daily   potassium chloride SA  20 mEq Oral Daily   prazosin  2 mg Oral QHS   rosuvastatin  20 mg Oral Daily   sodium chloride flush  10-40 mL Intracatheter Q12H   sodium chloride flush  3 mL Intravenous Q12H   spironolactone  25 mg Oral Daily   tetracaine  5 mg Intradermal Once   tetracaine  5  mg Intradermal Once   traZODone  200 mg Oral QHS    Infusions:  sodium chloride      PRN Medications: sodium chloride, acetaminophen, alum & mag hydroxide-simeth, guaiFENesin-dextromethorphan, hydrOXYzine, ondansetron (ZOFRAN) IV, mouth rinse, oxyCODONE, sodium chloride flush, sodium chloride flush    Patient Profile   51 y.o. male with h/o chronic systolic heart failure, VT s/p ICD (MDT CRT-D), HTN, HLD, Type 2 DM, OSA on CPAP and h/o substance use (cocaine) referred for [facility] which demonstrated severe NICM w/ elevated filling pressures and low output. Direct admitted from cath lab for IV diuretics and initiation of inotropic support w/ milrinone.   Assessment/Plan   1. Acute on Chronic Systolic CHF:  Nonischemic cardiomyopathy.  MDT CRT-D device.  Echoes 2021-2023, LVEF range 25-35%, RV mildly reduced.  Reportedly diagnosed with nonischemic cardiomyopathy in 2012 in [place], etiology felt due to cocaine abuse.  [facility]ology 03/07/2019 notes mention patient has CAD with history of angioplasty in the past. Unable to locate cath reports/ details regarding coronary anatomy in record. Most recent echo in 2/23 showed EF 25%, mild LV dilation, mildly decreased RV function. RHC in 9/23 showed low CI at 1.82.  Ongoing NYHA class IIIb symptoms. Repeat RHC done this admission showed elevated filling pressures and low output, mRA 14, PAP 45/22, mPCWP 27, LVEDP 32, PA sat 60%, CI 1.77. LHC showed no significant coronary disease. Good diuresis again with weight down.  CVP 5, co-ox 67%.  On milrinone 0.125.  Creatinine up to 1.58 today, SBP lower in 90s.  He got torsemide 60 mg and Entresto this morning before I was able to hold them.  - Mild AKI, suspect we overshot his diuresis some.  Since he got torsemide this morning, will give back some gentle IV fluid.  - Hold Entresto for now, restart when BP/creatinine stabilize.  - stop milrinone.   - Continue Farxiga 10 daily.   - Hold spironolactone  until creatinine stabilizes.   - Continue Coreg to 3.125 mg bid - Continue Digoxin 0.125 mg daily, level low at admission.   - Suspect that with OHS/OSA, obesity, and his poor mobility that he will be a poor candidate for LVAD/transplant.  2. CAD: Documented in Care Everywhere, reported h/o angioplasty, details unknown (cannot find a cath report though prior CAD is referenced).  He has had atypical chest pain. LHC done today showed no significant CAD - on Plavix 75 mg daily, has ASA allergy.   - Continue Crestor 20 mg daily.  3. H/o VT: Has CRT-D w/ chronic systolic heart failure and QRS > 150 ms  - followed  by [facility] device clinic  4. HTN: BP controlled.  - continue GDMT per above  5. Type 2DM - Hgb A1C >9.  - continue Semglee 50 units [place] bid.   - now on Farxiga. As above watch with fungal infection. Added antifungal powder.  6. Cocaine: Says he has quit for months now.    7. Depression: Depressed due to lack of social/family support and food insecurity  - continue Cymbalta  8. Jehovah's Witness.  9. OHS/OSA: He uses oxygen during the day and OSA at night.  This likely plays a role in his dyspnea.  10. Yeast infection under pannus: He will use Nystatin.  - monitor w/ Farxiga  11. AKI: Creatinine up to 1.58 today, suspect we overshot his diuresis.  SBP in 90s.  Unfortunately, he got all his meds today before I was able to hold them.  - NS 100 cc/hr x 5 hrs.   Consult cardiac rehab and diabetes coordinator.  SDOH- Consult TOC. Needs help food stamps and disability.   Mobilize.    Loralie Champagne 04/19/2022 10:04 AM

## 2022-04-19 NOTE — Progress Notes (Signed)
   04/19/22 0512  Vitals  Temp 97.8 F (36.6 C)  Temp Source Oral  BP (!) 73/41  MAP (mmHg) (!) 53  BP Location Left Arm  BP Method Automatic  Patient Position (if appropriate) Lying  Pulse Rate (!) 106  Pulse Rate Source Monitor  ECG Heart Rate (!) 108  Resp (!) 22  Level of Consciousness  Level of Consciousness Alert  MEWS COLOR  MEWS Score Color Red  Oxygen Therapy  SpO2 94 %  MEWS Score  MEWS Temp 0  MEWS Systolic 2  MEWS Pulse 1  MEWS RR 1  MEWS LOC 0  MEWS Score 4  Provider Notification  Provider Name/Title Dr Alfred Levins  Date Provider Notified 04/19/22  Time Provider Notified [phone]  Method of Notification Page  Notification Reason Change in status;Other (Comment) (re mews)  Provider response No new orders  Date of Provider Response 04/19/22  Time of Provider Response [phone]

## 2022-04-20 ENCOUNTER — Inpatient Hospital Stay (HOSPITAL_COMMUNITY): Payer: Medicare Other

## 2022-04-20 ENCOUNTER — Other Ambulatory Visit (HOSPITAL_COMMUNITY): Payer: Self-pay

## 2022-04-20 ENCOUNTER — Ambulatory Visit: Payer: Medicare Other | Admitting: Physician Assistant

## 2022-04-20 ENCOUNTER — Telehealth (HOSPITAL_COMMUNITY): Payer: Self-pay | Admitting: Pharmacy Technician

## 2022-04-20 ENCOUNTER — Encounter (HOSPITAL_COMMUNITY): Payer: Medicare Other

## 2022-04-20 DIAGNOSIS — I5023 Acute on chronic systolic (congestive) heart failure: Secondary | ICD-10-CM | POA: Diagnosis not present

## 2022-04-20 DIAGNOSIS — R609 Edema, unspecified: Secondary | ICD-10-CM | POA: Diagnosis not present

## 2022-04-20 LAB — CBC
HCT: 43.5 % (ref 39.0–52.0)
Hemoglobin: 14.1 g/dL (ref 13.0–17.0)
MCH: 28.6 pg (ref 26.0–34.0)
MCHC: 32.4 g/dL (ref 30.0–36.0)
MCV: 88.2 fL (ref 80.0–100.0)
Platelets: 232 10*3/uL (ref 150–400)
RBC: 4.93 MIL/uL (ref 4.22–5.81)
RDW: 13.5 % (ref 11.5–15.5)
WBC: 9.6 10*3/uL (ref 4.0–10.5)
nRBC: 0 % (ref 0.0–0.2)

## 2022-04-20 LAB — GLUCOSE, CAPILLARY
Glucose-Capillary: 99 mg/dL (ref 70–99)
Glucose-Capillary: 206 mg/dL — ABNORMAL HIGH (ref 70–99)
Glucose-Capillary: 138 mg/dL — ABNORMAL HIGH (ref 70–99)
Glucose-Capillary: 123 mg/dL — ABNORMAL HIGH (ref 70–99)

## 2022-04-20 LAB — BASIC METABOLIC PANEL
Anion gap: 10 (ref 5–15)
BUN: 24 mg/dL — ABNORMAL HIGH (ref 6–20)
CO2: 29 mmol/L (ref 22–32)
Calcium: 8.5 mg/dL — ABNORMAL LOW (ref 8.9–10.3)
Chloride: 98 mmol/L (ref 98–111)
Creatinine, Ser: 1.14 mg/dL (ref 0.61–1.24)
GFR, Estimated: >60 mL/min (ref >60)
Glucose, Bld: 116 mg/dL — ABNORMAL HIGH (ref 70–99)
Potassium: 3.8 mmol/L (ref 3.5–5.1)
Sodium: 137 mmol/L (ref 135–145)

## 2022-04-20 LAB — COOXEMETRY PANEL
Carboxyhemoglobin: 1.4 % (ref 0.5–1.5)
Methemoglobin: 1.5 % (ref 0.0–1.5)
O2 Saturation: 66.2 %
Total hemoglobin: 14.6 g/dL (ref 12.0–16.0)

## 2022-04-20 MED ORDER — SPIRONOLACTONE 12.5 MG HALF TABLET
12.5000 mg | ORAL_TABLET | Freq: Every day | ORAL | Status: DC
  Administered 2022-04-20: 12.5 mg via ORAL
  Filled 2022-04-20: qty 1

## 2022-04-20 MED ORDER — POTASSIUM CHLORIDE CRYS ER 20 MEQ PO TBCR
40.0000 meq | EXTENDED_RELEASE_TABLET | Freq: Every day | ORAL | Status: DC
  Administered 2022-04-20 – 2022-04-21 (×2): 40 meq via ORAL
  Filled 2022-04-20 (×2): qty 2

## 2022-04-20 MED ORDER — APIXABAN 5 MG PO TABS
10.0000 mg | ORAL_TABLET | Freq: Two times a day (BID) | ORAL | Status: DC
  Administered 2022-04-20 – 2022-04-23 (×6): 10 mg via ORAL
  Filled 2022-04-20 (×6): qty 2

## 2022-04-20 MED ORDER — SACUBITRIL-VALSARTAN 24-26 MG PO TABS
1.0000 | ORAL_TABLET | Freq: Two times a day (BID) | ORAL | Status: DC
  Administered 2022-04-20 (×2): 1 via ORAL
  Filled 2022-04-20 (×2): qty 1

## 2022-04-20 MED ORDER — TORSEMIDE 20 MG PO TABS
40.0000 mg | ORAL_TABLET | Freq: Every day | ORAL | Status: DC
  Administered 2022-04-20: 40 mg via ORAL
  Filled 2022-04-20: qty 2

## 2022-04-20 MED ORDER — APIXABAN 5 MG PO TABS: 5.0000 mg | ORAL_TABLET | Freq: Two times a day (BID) | ORAL | Status: DC

## 2022-04-20 NOTE — Discharge Summary (Incomplete)
Advanced Heart Failure Team  Discharge Summary   Patient ID: Luis [name] MRN: [id number], DOB/AGE: [birth year] 51 y.o. Admit date: 04/16/2022 D/C date:     04/23/2022   Primary Discharge Diagnoses:  Acute on chronic systolic CHF with low-output Nonischemic cardiomyopathy AKI Uncontrolled DM II  Secondary Discharge Diagnoses:  H/o VT HTN Hx cocaine abuse Depression OHS/OSA   Hospital Course:  51 y.o. male with h/o chronic systolic heart failure, VT s/p ICD (MDT CRT-D), HTN, HLD, Type 2 DM, OSA on CPAP, Jehovah's Witness and h/o substance use (cocaine). He has a reported h/o CAD/PCI in [place] though his cardiomyopathy has been classified as NICM. Previously unable to locate LHC/ angiographic details in [place]. Echos from 2021-2023 have shown LVEF ranging from 25-35%, RV mildly reduced.  Cardiomyopathy felt likely caused by cocaine use.    He was admitted to [facility] 9/23 for acute on chronic systolic CHF.  ICD was also noted to be at [facility]. Echo showed LVEF 25% w/ global HK, RV mildly reduced. He was diuresed w/ IV Lasix and underwent RHC revealing normal filling pressures and reduced CO w/ CI of 1.82 L/min/m. He was placed on GDMT and transitioned to PO torsemide. Underwent gen change on 9/25 prior to d/c. D/c wt 296 lb. Referred to [facility] clinic at d/c. From there, he was referred to the [facility].    Seen in [facility] on 10/17 w/ NYHA Class IIIb symptoms and fluid overload. Diuretics increased set up for RHC.    Presented 10/19 for [facility] which demonstrated elevated filling pressures and low output. mRA 14, PAP 45/22, mPCWP 27, LVEDP 32, PA sat 60%, CI 1.77. LHC showed no significant CAD. He was direct admitted from cath lab for IV diuresis and initiation of inotropic support w/ milrinone. He diuresed well with IV lasix and milrinone. Once diuresed, he was weaned off milrinone with stable CO-OX. Developed mild AKI d/t overdiuresis. Renal function improved with IV fluid. BP lowering  meds briefly held then added back prior to discharge.   Developed right upper extremity DVT at PICC site. Started on anticoagulation with Eliquis.  TOC consulted and assisted with food stamps application and disability. He was provided $50 gift card to assist with food and RW w/ seat via HF fund.  Discharged home with [facility] PT/OT.  F/u in HF clinic arranged 04/29/22. Will need BMET/BNP and digoxin level.  Please see below for hospital course by problem.   Hospital Course by Problem:  1. Acute on Chronic Systolic CHF with low-output:  Nonischemic cardiomyopathy.  MDT CRT-D device.  Echoes 2021-2023, LVEF range 25-35%, RV mildly reduced.  Reportedly diagnosed with nonischemic cardiomyopathy in 2012 in [place], etiology felt due to cocaine abuse.  [place] cardiology 03/07/2019 notes mention patient has CAD with history of angioplasty in the past. Unable to locate cath reports/ details regarding coronary anatomy in record. Most recent echo in 2/23 showed EF 25%, mild LV dilation, mildly decreased RV function. RHC in 9/23 showed low CI at 1.82.  Ongoing NYHA class IIIb symptoms. Repeat RHC done this admission showed elevated filling pressures and low output, mRA 14, PAP 45/22, mPCWP 27, LVEDP 32, PA sat 60%, CI 1.77. LHC showed no significant coronary disease.  - Good diuresis with milrinone. Stable off inotrope support. - Had mild AKI d/t overdiuresis. Given IV fluid back on 10/22 with improvement. - Volume looks okay. Continue Torsemide 60 mg daily - Continue spiro 25 mg daily - Continue Entresto 49/51 mg BID - Continue  Farxiga 10 daily.   - Continue Coreg to 3.125 mg bid - Continue Digoxin 0.125 mg daily, level low at admission.   - Suspect that with OHS/OSA, obesity, and his poor mobility that he will be a poor candidate for LVAD/transplant.  2. CAD: Documented in Care Everywhere, reported h/o angioplasty, details unknown (cannot find a cath report though prior CAD is referenced).  He has had  atypical chest pain. LHC showed no significant CAD - Stopped Plavix since starting Eliquis.    - Continue Crestor 20 mg daily.  3. H/o VT: Has CRT-D w/ chronic systolic heart failure and QRS > 150 ms  - followed by [facility] device clinic  4. HTN: BP controlled.  - continue GDMT per above  5. Type 2DM - Hgb A1C >9.  - Resumed home insulin and trulicity at discharge. - now on Farxiga. As above watch with fungal infection. Added antifungal powder.  - He reports he has f/u with PCP next week 6. Cocaine: Says he has quit for months now.    7. Depression: Depressed due to lack of social/family support and food insecurity  - continue Cymbalta  8. Jehovah's Witness.  9. OHS/OSA: He uses oxygen during the day and CPAP at night.  This likely plays a role in his dyspnea.  10. Yeast infection under pannus: Used nystatin in hospital. - monitor w/ Farxiga  11. AKI: Creatinine up to 1.58 on 10/22. Diuretics and entresto/spiro held. Given IV fluid back. Scr improved. 12. RUE DVT: - Provoked. PICC line pulled. Had refused some doses of Lovenox. - Continue Eliquis 10 BID X 1 week then reduce to 5 mg BID. Need to continue at least 3 months   Discharge Weight Range: 299->283lb Discharge Vitals: Blood pressure 105/74, pulse 85, temperature 98.1 F (36.7 C), temperature source Oral, resp. rate 18, height 5\' 6"  (1.676 m), weight 128.7 kg, SpO2 94 %.  Labs: Lab Results  Component Value Date   WBC 9.7 04/23/2022   HGB 14.6 04/23/2022   HCT 43.7 04/23/2022   MCV 86.2 04/23/2022   PLT 257 04/23/2022    Recent Labs  Lab 04/16/22 1808 04/17/22 0033 04/23/22 0055  NA 139   < > 132*  K 4.2   < > 3.9  CL 104   < > 95*  CO2 26   < > 28  BUN 12   < > 25*  CREATININE 1.10   < > 1.27*  CALCIUM 8.6*   < > 8.6*  PROT 6.4*  --   --   BILITOT 0.3  --   --   ALKPHOS 39  --   --   ALT 18  --   --   AST 20  --   --   GLUCOSE 396*   < > 175*   < > = values in this interval not displayed.   Lab Results   Component Value Date   CHOL 129 03/30/2022   HDL 35 (L) 03/30/2022   LDLCALC UNABLE TO CALCULATE IF TRIGLYCERIDE OVER 400 mg/dL 03/30/2022   TRIG 405 (H) 03/30/2022   BNP (last 3 results) Recent Labs    04/07/22 1607 04/14/22 1616 04/16/22 1808  BNP 180.1* 161.9* 336.9*    ProBNP (last 3 results) No results for input(s): "PROBNP" in the last 8760 hours.   Diagnostic Studies/Procedures  Right and left heart cath, 04/16/22: Right Heart Pressures RHC Procedural Findings: Hemodynamics (mmHg) RA mean 14 RV 41/21 PA 45/22, mean 31 PCWP mean 27 LV  112/32  Oxygen saturations: PA 60% AO 100%  Cardiac Output (Fick) 4.3  Cardiac Index (Fick) 1.77 PVR 1 WU PAPI 1.6   Conclusion: 1. No significant CAD, nonischemic cardiomyopathy.  2. Significantly elevated right and left heart filling pressures.  3. Low cardiac output.  4. Mild pulmonary venous hypertension.     Discharge Medications   Allergies as of 04/23/2022       Reactions   Aspirin Anaphylaxis, Other (See Comments)   Throat closing   Iodine-131 Hives   Peanut (diagnostic) Anaphylaxis   Throat closes   Iodinated Contrast Media Hives   Can be premedicated    Latex Hives, Rash   Penicillins Nausea And Vomiting   Ultram [tramadol] Hives, Nausea Only   Zestril [lisinopril] Cough   Augmentin [amoxicillin-pot Clavulanate] Diarrhea   Lidocaine Rash        Medication List     STOP taking these medications    clopidogrel 75 MG tablet Commonly known as: PLAVIX   furosemide 80 MG tablet Commonly known as: LASIX   Lantus SoloStar 100 UNIT/ML Solostar Pen Generic drug: insulin glargine   metolazone 2.5 MG tablet Commonly known as: ZAROXOLYN   OXYGEN   terbinafine 1 % cream Commonly known as: LAMISIL       TAKE these medications    carvedilol 3.125 MG tablet Commonly known as: COREG Take 1 tablet (3.125 mg total) by mouth 2 (two) times daily with a meal. What changed:  medication  strength how much to take Another medication with the same name was removed. Continue taking this medication, and follow the directions you see here.   cyclobenzaprine 10 MG tablet Commonly known as: FLEXERIL Take 10 mg by mouth daily.   digoxin 0.125 MG tablet Commonly known as: LANOXIN Take 1 tablet (0.125 mg total) by mouth daily.   DULoxetine 30 MG capsule Commonly known as: CYMBALTA Take 3 capsules (90 mg total) by mouth daily.   Eliquis 5 MG Tabs tablet Generic drug: apixaban Take 2 tablets (10 mg) twice a day through 10/29, starting 10/30 take 1 tablet (5 mg) twice a day   Entresto 49-51 MG Generic drug: sacubitril-valsartan Take 1 tablet by mouth 2 (two) times daily. What changed: Another medication with the same name was removed. Continue taking this medication, and follow the directions you see here.   Farxiga 10 MG Tabs tablet Generic drug: dapagliflozin propanediol Take 1 tablet (10 mg total) by mouth daily. What changed: Another medication with the same name was removed. Continue taking this medication, and follow the directions you see here.   HumaLOG Mix 75/25 KwikPen (75-25) 100 UNIT/ML Kwikpen Generic drug: Insulin Lispro Prot & Lispro Inject 70 Units into the skin 3 (three) times daily. What changed: Another medication with the same name was removed. Continue taking this medication, and follow the directions you see here.   hydrOXYzine 25 MG tablet Commonly known as: ATARAX Take 1 tablet (25 mg total) by mouth 3 (three) times daily as needed for anxiety.   pantoprazole 40 MG tablet Commonly known as: PROTONIX Take 1 tablet (40 mg total) by mouth daily.   potassium chloride SA 20 MEQ tablet Commonly known as: KLOR-CON M Take 1 tablet (20 mEq total) by mouth daily.   prazosin 2 MG capsule Commonly known as: MINIPRESS Take 1 capsule (2 mg total) by mouth at bedtime.   rosuvastatin 20 MG tablet Commonly known as: CRESTOR Take 1 tablet (20 mg total)  by mouth daily.   spironolactone 25  MG tablet Commonly known as: ALDACTONE Take 1 tablet (25 mg total) by mouth daily.   torsemide 20 MG tablet Commonly known as: DEMADEX Take 3 tablets (60 mg total) by mouth daily. What changed: how much to take   traZODone 100 MG tablet Commonly known as: DESYREL Take 2 tablets (200 mg total) by mouth at bedtime.   Trulicity 1.5 0000000 Sopn Generic drug: Dulaglutide Inject 1.5 mg into the skin once a week.               Durable Medical Equipment  (From admission, onward)           Start     Ordered   04/21/22 1616  For home use only DME 4 wheeled rolling walker with seat  Once       Question:  Patient needs a walker to treat with the following condition  Answer:  HF (heart failure) ([place])   04/21/22 1615   04/20/22 1349  Heart failure home health orders  (Heart failure home health orders / Face to face)  Once       Comments: Heart Failure Follow-up Care:  Verify follow-up appointments per Patient Discharge Instructions. Confirm transportation arranged. Reconcile home medications with discharge medication list. Remove discontinued medications from use. Assist patient/caregiver to manage medications using pill box. Reinforce low sodium food selection Assessments: Vital signs and oxygen saturation at each visit. Assess home environment for safety concerns, caregiver support and availability of low-sodium foods. Consult Education officer, museum, PT/OT, Dietitian, and CNA based on assessments. Perform comprehensive cardiopulmonary assessment. Notify MD for any change in condition or weight gain of 3 pounds in one day or 5 pounds in one week with symptoms. Daily Weights and Symptom Monitoring: Ensure patient has access to scales. Teach patient/caregiver to weigh daily before breakfast and after voiding using same scale and record.    Teach patient/caregiver to track weight and symptoms and when to notify Provider. Activity: Develop  individualized activity plan with patient/caregiver.  Heart Failure HHPT  Question Answer Comment  Heart Failure Follow-up Care Advanced Heart Failure (AHF) Clinic at [phone]   Lab frequency Other see comments   Fax lab results to AHF Clinic at [phone]   Fax lab results to Other see comments   Diet Low Sodium Heart Healthy   Fluid restrictions: 1800 mL Fluid      04/20/22 1349   04/20/22 0827  Heart failure home health orders  (Heart failure home health orders / Face to face)  Once       Comments: Heart Failure Follow-up Care:  Verify follow-up appointments per Patient Discharge Instructions. Confirm transportation arranged. Reconcile home medications with discharge medication list. Remove discontinued medications from use. Assist patient/caregiver to manage medications using pill box. Reinforce low sodium food selection Assessments: Vital signs and oxygen saturation at each visit. Assess home environment for safety concerns, caregiver support and availability of low-sodium foods. Consult Education officer, museum, PT/OT, Dietitian, and CNA based on assessments. Perform comprehensive cardiopulmonary assessment. Notify MD for any change in condition or weight gain of 3 pounds in one day or 5 pounds in one week with symptoms. Daily Weights and Symptom Monitoring: Ensure patient has access to scales. Teach patient/caregiver to weigh daily before breakfast and after voiding using same scale and record.    Teach patient/caregiver to track weight and symptoms and when to notify Provider. Activity: Develop individualized activity plan with patient/caregiver.   Question Answer Comment  Heart Failure Follow-up Care Advanced Heart Failure (AHF) Clinic at  [place] Visits Set up telemonitoring equipment to monitor daily vital signs, weights and oxygen saturation   Obtain the following labs Basic Metabolic Panel   Lab frequency Weekly   Fax lab results to AHF Clinic at  [phone]   Diet Low Sodium Heart Healthy   Fluid restrictions: 1200 mL Fluid      04/20/22 [id number]            Disposition   The patient will be discharged in stable condition to home. Discharge Instructions     (HEART FAILURE PATIENTS) Call MD:  Anytime you have any of the following symptoms: 1) 3 pound weight gain in 24 hours or 5 pounds in 1 week 2) shortness of breath, with or without a dry hacking cough 3) swelling in the hands, feet or stomach 4) if you have to sleep on extra pillows at night in order to breathe.   Complete by: As directed    Diet - low sodium heart healthy   Complete by: As directed    Heart Failure patients record your daily weight using the same scale at the same time of day   Complete by: As directed    Increase activity slowly   Complete by: As directed    STOP any activity that causes chest pain, shortness of breath, dizziness, sweating, or exessive weakness   Complete by: As directed        Follow-up Information     [place] Follow up on 04/29/2022.   Specialty: Cardiology Why: Advanced Heart Failure Clinic 2:30 pm Entrance C, Free Valet Parking Please bring all meds to appointment Contact information: [address] [id number] [place], [place] Follow up.   Why: Home Health RN and Physical Therapy-agency will call with appt Contact information: [place] [address] [phone]         Buzzy Han, MD. Go on 04/28/2022.   Specialty: Family Medicine Why: @3 :30pm Contact information: [place] [address] [phone]                   Duration of Discharge Encounter: Greater than 35 minutes   Signed, Tandi Hanko N  04/23/2022, 1:25 PM

## 2022-04-20 NOTE — Progress Notes (Signed)
Walked earlier. Declines now. No questions regarding managing HF.  Yves Dill BS, ACSM-CEP 04/20/2022 11:49 AM

## 2022-04-20 NOTE — TOC Benefit Eligibility Note (Addendum)
Patient Teacher, English as a foreign language completed.    The patient is currently admitted and upon discharge could be taking Entresto 24-26 mg.  The current 30 day co-pay is $0.00.   The patient is currently admitted and upon discharge could be taking Eliquis 5 mg.  The current 30 day co-pay is $0.00.   The patient is insured through [place] Patient Advocate Specialist [place] Patient Advocate Team Direct Number: [phone]  Fax: [phone]

## 2022-04-20 NOTE — Progress Notes (Signed)
Physical Therapy Treatment Patient Details Name: Luis [name] MRN: [id number] DOB: [birth year] Today's Date: 04/20/2022   History of Present Illness Pt is 51 yo male who returns to [facility] hospital on 04/16/2022. Pt underwent scheduled heart cath which demonstrated elevated filling pressures and low output. Pt directed from cath lab to floor for IV diuresis and inotropic support. PMH includes HTN, HLD, DMII, OSA, cocaine abuse, VT s/p ICD.    PT Comments    Pt reports he has already walked in the room this morning, but reluctantly agreeable to walk with therapy. MD in room and reports BP and HR are good and he is appropriately diuresed and should try to progress his mobility. Pt is mod I for bed mobility and transfers, ambulates with supervision. Pt walks into bathroom prior to ambulation in hallway. Pt requires 2x standing rest breaks for recovery with ambulation, Reports dizziness and seeing spots, SpO2 99%O2. Pt willing to sit up in chair as he wants to go home. D/c plan remains appropriate at this time. PT will continue to follow acutely.   Recommendations for follow up therapy are one component of a multi-disciplinary discharge planning process, led by the attending physician.  Recommendations may be updated based on patient status, additional functional criteria and insurance authorization.  Follow Up Recommendations  Home health PT     Assistance Recommended at Discharge PRN  Patient can return home with the following A little help with bathing/dressing/bathroom;Assistance with cooking/housework;Assist for transportation   Equipment Recommendations  None recommended by PT       Precautions / Restrictions Precautions Precautions: Fall;Other (comment) Precaution Comments: monitor sats Restrictions Weight Bearing Restrictions: No     Mobility  Bed Mobility Overal bed mobility: Modified Independent                  Transfers Overall transfer level: Modified  independent                      Ambulation/Gait Ambulation/Gait assistance: Supervision Gait Distance (Feet): 75 Feet (50, +25 (2 x standing rest breaks secondary to dizziness)) Assistive device: Rolling walker (2 wheels) Gait Pattern/deviations: Step-through pattern Gait velocity: reduced Gait velocity interpretation: <1.8 ft/sec, indicate of risk for recurrent falls   General Gait Details: slowed step-through gait      Balance Overall balance assessment: Needs assistance Sitting-balance support: No upper extremity supported, Feet supported Sitting balance-Leahy Scale: Good     Standing balance support: No upper extremity supported, During functional activity Standing balance-Leahy Scale: Fair                              Cognition Arousal/Alertness: Awake/alert Behavior During Therapy: WFL for tasks assessed/performed Overall Cognitive Status: Within Functional Limits for tasks assessed                                             General Comments General comments (skin integrity, edema, etc.): pt ambulates on 3L O2 via [place], SpO2 checked during ambulation 99%O2 despite SoB, encouraged pt to purse lip breath      Pertinent Vitals/Pain Pain Assessment Pain Assessment: Faces Faces Pain Scale: Hurts a little bit Pain Location: generalized with mobility Pain Descriptors / Indicators: Discomfort, Grimacing, Guarding Pain Intervention(s): Limited activity within patient's tolerance, Monitored during session, Repositioned  PT Goals (current goals can now be found in the care plan section) Acute Rehab PT Goals Patient Stated Goal: to improve heart function and stay at home, stop returning to hospital PT Goal Formulation: With patient Time For Goal Achievement: 05/01/22 Potential to Achieve Goals: Fair Progress towards PT goals: Progressing toward goals    Frequency    Min 3X/week      PT Plan Current plan remains  appropriate    Co-evaluation              AM-PAC PT "6 Clicks" Mobility   Outcome Measure  Help needed turning from your back to your side while in a flat bed without using bedrails?: None Help needed moving from lying on your back to sitting on the side of a flat bed without using bedrails?: None Help needed moving to and from a bed to a chair (including a wheelchair)?: None Help needed standing up from a chair using your arms (e.g., wheelchair or bedside chair)?: None Help needed to walk in hospital room?: A Little Help needed climbing 3-5 steps with a railing? : A Little 6 Click Score: 22    End of Session Equipment Utilized During Treatment: Oxygen Activity Tolerance: Patient limited by fatigue Patient left: with call bell/phone within reach;in chair Nurse Communication: Mobility status PT Visit Diagnosis: Other abnormalities of gait and mobility (R26.89)     Time: [id number] PT Time Calculation (min) (ACUTE ONLY): 26 min  Charges:  $Therapeutic Exercise: 8-22 mins $Therapeutic Activity: 8-22 mins                     Rossie Scarfone B. Migdalia Dk PT, DPT Acute Rehabilitation Services Please use secure chat or  Call Office [phone]    [place] 04/20/2022, 11:09 AM

## 2022-04-20 NOTE — Progress Notes (Addendum)
Patient ID: Luis [name], male   DOB: May 05, 1971, 51 y.o.   MRN: [id number]     Advanced Heart Failure Rounding Note  PCP-Cardiologist: Glenetta Hew, MD   Subjective:    10/19: Cath- Elevated filling pressures + low output. Started on milrinone. 10/22: Milrinone stopped. AKI d/t overdiuresis.   CO-OX 66% off milrinone.   CVP 7-8.   Scr improved 1.58> 1.14 after given fluid back yesterday. Diuretics and entresto/spiro on hold.  BMP improved 100s-110s  Still with intermittent chest pain, comes and goes with no clear triggers  Notes quite a bit of pain at RUE PICC site   Objective:   Weight Range: 130.6 kg Body mass index is 46.47 kg/m.   Vital Signs:   Temp:  [97.9 F (36.6 C)-98.7 F (37.1 C)] 97.9 F (36.6 C) (10/23 0725) Pulse Rate:  [91-100] 97 (10/22 2130) Resp:  [18-20] 18 (10/23 0725) BP: (73-112)/(44-77) 105/44 (10/23 0725) SpO2:  [93 %-98 %] 96 % (10/22 1601) Weight:  [130.6 kg] 130.6 kg (10/23 0500) Last BM Date : 04/18/22  Weight change: Filed Weights   04/18/22 0343 04/19/22 0450 04/20/22 0500  Weight: 133.7 kg 129 kg 130.6 kg    Intake/Output:   Intake/Output Summary (Last 24 hours) at 04/20/2022 0812 Last data filed at 04/20/2022 0500 Gross per 24 hour  Intake 1935.6 ml  Output 2900 ml  Net -964.4 ml      Physical Exam    General:  Lying comfortably in bed.  HEENT: normal Neck: supple. JVP ~ 8 cm. Carotids 2+ bilat; no bruits.  Cor: PMI nondisplaced. Regular rate & rhythm. No rubs, gallops or murmurs. Lungs: clear Abdomen: morbidly obese, soft, nontender, nondistended.  Extremities: no cyanosis, clubbing, rash, trace edema, RUE PICC site tender Neuro: alert & orientedx3, cranial nerves grossly intact. moves all 4 extremities w/o difficulty. Affect pleasant   Telemetry   NSR 80s with BiV pacing (personally reviewed)  EKG    [name]/A  Labs    CBC Recent Labs    04/19/22 0540 04/20/22 0447  WBC 10.9* 9.6  HGB 14.6 14.1   HCT 43.8 43.5  MCV 86.7 88.2  PLT 265 [id number]   Basic Metabolic Panel Recent Labs    04/19/22 0540 04/20/22 0447  NA 138 137  K 3.5 3.8  CL 97* 98  CO2 29 29  GLUCOSE 170* 116*  BUN 20 24*  CREATININE 1.58* 1.14  CALCIUM 8.3* 8.5*   Liver Function Tests No results for input(s): "AST", "ALT", "ALKPHOS", "BILITOT", "PROT", "ALBUMIN" in the last 72 hours.  No results for input(s): "LIPASE", "AMYLASE" in the last 72 hours. Cardiac Enzymes No results for input(s): "CKTOTAL", "CKMB", "CKMBINDEX", "TROPONINI" in the last 72 hours.  BNP: BNP (last 3 results) Recent Labs    04/07/22 1607 04/14/22 1616 04/16/22 1808  BNP 180.1* 161.9* 336.9*    ProBNP (last 3 results) No results for input(s): "PROBNP" in the last 8760 hours.   D-Dimer No results for input(s): "DDIMER" in the last 72 hours. Hemoglobin A1C No results for input(s): "HGBA1C" in the last 72 hours. Fasting Lipid Panel No results for input(s): "CHOL", "HDL", "LDLCALC", "TRIG", "CHOLHDL", "LDLDIRECT" in the last 72 hours. Thyroid Function Tests No results for input(s): "TSH", "T4TOTAL", "T3FREE", "THYROIDAB" in the last 72 hours.  Invalid input(s): "FREET3"  Other results:   Imaging    No results found.   Medications:     Scheduled Medications:  carvedilol  3.125 mg Oral BID WC   Chlorhexidine  Gluconate Cloth  6 each Topical Daily   clopidogrel  75 mg Oral Daily   dapagliflozin propanediol  10 mg Oral Daily   digoxin  0.125 mg Oral Daily   DULoxetine  90 mg Oral Daily   enoxaparin (LOVENOX) injection  40 mg Subcutaneous Q24H   insulin aspart  0-15 Units Subcutaneous TID AC & HS   insulin glargine-yfgn  50 Units Subcutaneous BID   nystatin cream   Topical BID   pantoprazole  40 mg Oral Daily   potassium chloride SA  20 mEq Oral Daily   prazosin  2 mg Oral QHS   rosuvastatin  20 mg Oral Daily   sodium chloride flush  10-40 mL Intracatheter Q12H   sodium chloride flush  3 mL Intravenous Q12H    tetracaine  5 mg Intradermal Once   tetracaine  5 mg Intradermal Once   traZODone  200 mg Oral QHS    Infusions:  sodium chloride      PRN Medications: sodium chloride, acetaminophen, alum & mag hydroxide-simeth, guaiFENesin-dextromethorphan, hydrOXYzine, ondansetron (ZOFRAN) IV, mouth rinse, oxyCODONE, sodium chloride flush, sodium chloride flush    Patient Profile   51 y.o. male with h/o chronic systolic heart failure, VT s/p ICD (MDT CRT-D), HTN, HLD, Type 2 DM, OSA on CPAP and h/o substance use (cocaine) referred for [facility] which demonstrated severe NICM w/ elevated filling pressures and low output. Direct admitted from cath lab for IV diuretics and initiation of inotropic support w/ milrinone.   Assessment/Plan   1. Acute on Chronic Systolic CHF:  Nonischemic cardiomyopathy.  MDT CRT-D device.  Echoes 2021-2023, LVEF range 25-35%, RV mildly reduced.  Reportedly diagnosed with nonischemic cardiomyopathy in 2012 in [place], etiology felt due to cocaine abuse.  [place] cardiology 03/07/2019 notes mention patient has CAD with history of angioplasty in the past. Unable to locate cath reports/ details regarding coronary anatomy in record. Most recent echo in 2/23 showed EF 25%, mild LV dilation, mildly decreased RV function. RHC in 9/23 showed low CI at 1.82.  Ongoing NYHA class IIIb symptoms. Repeat RHC done this admission showed elevated filling pressures and low output, mRA 14, PAP 45/22, mPCWP 27, LVEDP 32, PA sat 60%, CI 1.77. LHC showed no significant coronary disease.  - Good diuresis with milrinone. Now off inotrope support with stable CO-OX. - Had mild AKI d/t overdiuresis. Given IV fluid back on 10/22. CVP 8. Scr improved 1.58>1.14. Start Torsemide 40 daily - Continue to hold entresto. BP too low to add back - Add back spiro 12.5 mg daily - Continue Farxiga 10 daily.   - Continue Coreg to 3.125 mg bid - Continue Digoxin 0.125 mg daily, level low at admission.   - Suspect that with  OHS/OSA, obesity, and his poor mobility that he will be a poor candidate for LVAD/transplant.  2. CAD: Documented in Care Everywhere, reported h/o angioplasty, details unknown (cannot find a cath report though prior CAD is referenced).  He has had atypical chest pain. LHC showed no significant CAD - on Plavix 75 mg daily, has ASA allergy.   - Continue Crestor 20 mg daily.  3. H/o VT: Has CRT-D w/ chronic systolic heart failure and QRS > 150 ms  - followed by [facility] device clinic  4. HTN: BP controlled.  - continue GDMT per above  5. Type 2DM - Hgb A1C >9.  - continue Semglee 50 units Radom bid.   - now on Farxiga. As above watch with fungal infection. Added antifungal  powder.  6. Cocaine: Says he has quit for months now.    7. Depression: Depressed due to lack of social/family support and food insecurity  - continue Cymbalta  8. Jehovah's Witness.  9. OHS/OSA: He uses oxygen during the day and CPAP at night.  This likely plays a role in his dyspnea.  10. Yeast infection under pannus: He will use Nystatin.  - monitor w/ Farxiga  11. AKI: Creatinine up to 1.58 on 10/22. Diuretics and entresto/spiro held. Given IV fluid back. Scr improved to 1.14.  - BP better - Continue to monitor 12. RUE Pain: Noted at PICC site - Check [place]  Continue to mobilize. Will need HH PT/OT at discharge.  SDOH- Consult TOC. Needs help food stamps and disability.   Anticipate may be ready for discharge tomorrow. Will arrange f/u in HF clinic.    FINCH, Luis [name] 04/20/2022 8:12 AM  Patient seen with PA, agree with the above note.   CVP 8 today, co-ox 66%.  SBP 100s-110s.  Not lightheaded.    Says he feels bad, did not sleep last night, has had ongoing pressure in chest (has been present since admission).   General: NAD Neck: No JVD, no thyromegaly or thyroid nodule.  Lungs: Clear to auscultation bilaterally with normal respiratory effort. CV: Nondisplaced PMI.  Heart regular S1/S2, no S3/S4, no murmur.   No peripheral edema.   Abdomen: Soft, nontender, no hepatosplenomegaly, no distention.  Skin: Intact without lesions or rashes.  Neurologic: Alert and oriented x 3.  Psych: Normal affect. Extremities: No clubbing or cyanosis.  HEENT: Normal.   Overdiuresis, got some IVF back yesterday.  Creatinine down to 1.14 today with CVP 8 and good co-ox.  - Agree with restarting torsemide at 40 mg daily today, will likely need higher dose for home.  - Restart spironolactone 12.5 mg daily.  - Think he can restart Entresto 24/26 bid today.   Right arm pain, [place] for DVT.   Mobilize with PT.    If BP and creatinine remains stable, increase spironolactone and Entresto to prior home doses tomorrow and let him go home.   Luis [name] 04/20/2022 9:25 AM  DVT noted right upper extremity.  Will remove PICC line and start Eliquis treatment dose for DVT.  Will need to continue at least 3 months for triggered DVT.   Luis [name] 04/20/2022

## 2022-04-20 NOTE — Care Management Important Message (Signed)
Important Message  Patient Details  Name: Luis [name] MRN: [id number] Date of Birth: 01-06-1971   Medicare Important Message Given:  Yes     Shelda Altes 04/20/2022, 10:39 AM

## 2022-04-20 NOTE — Progress Notes (Signed)
RUE [place]: positive for DVT involving the R subclavian vein and R brachial veins.   Previously had been refusing Lovenox. Will d/c PICC, start trt dose Eliquis 10 mg BID  Earnie Larsson AGACNP-BC 04/20/22  Advanced Heart Failure Team

## 2022-04-20 NOTE — Progress Notes (Signed)
Spoke with nurse an patient regarding POC. Will follow up. Fran Lowes, RN VAST

## 2022-04-20 NOTE — Progress Notes (Signed)
Right upper extremity venous duplex completed. Refer to "CV Proc" under chart review to view preliminary results.  Preliminary results discussed with Alver Fisher, RN.  04/20/2022 2:49 PM Kelby Aline., MHA, RVT, RDCS, RDMS

## 2022-04-20 NOTE — Progress Notes (Signed)
Pt stated he will place CPAP on himself when ready

## 2022-04-20 NOTE — Telephone Encounter (Signed)
Pharmacy Patient Advocate Encounter  Insurance verification completed.    The patient is insured through [employer] Medicare Part D   The patient is currently admitted and ran test claims for the following: Entresto  Copays and coinsurance results were relayed to Inpatient clinical team. 

## 2022-04-21 DIAGNOSIS — I5023 Acute on chronic systolic (congestive) heart failure: Secondary | ICD-10-CM | POA: Diagnosis not present

## 2022-04-21 LAB — CBC
HCT: 44.4 % (ref 39.0–52.0)
Hemoglobin: 14.5 g/dL (ref 13.0–17.0)
MCH: 28.7 pg (ref 26.0–34.0)
MCHC: 32.7 g/dL (ref 30.0–36.0)
MCV: 87.9 fL (ref 80.0–100.0)
Platelets: 239 10*3/uL (ref 150–400)
RBC: 5.05 MIL/uL (ref 4.22–5.81)
RDW: 13.5 % (ref 11.5–15.5)
WBC: 9.8 10*3/uL (ref 4.0–10.5)
nRBC: 0 % (ref 0.0–0.2)

## 2022-04-21 LAB — GLUCOSE, CAPILLARY
Glucose-Capillary: 102 mg/dL — ABNORMAL HIGH (ref 70–99)
Glucose-Capillary: 192 mg/dL — ABNORMAL HIGH (ref 70–99)
Glucose-Capillary: 160 mg/dL — ABNORMAL HIGH (ref 70–99)
Glucose-Capillary: 196 mg/dL — ABNORMAL HIGH (ref 70–99)

## 2022-04-21 LAB — BASIC METABOLIC PANEL
Anion gap: 10 (ref 5–15)
BUN: 20 mg/dL (ref 6–20)
CO2: 29 mmol/L (ref 22–32)
Calcium: 8.9 mg/dL (ref 8.9–10.3)
Chloride: 98 mmol/L (ref 98–111)
Creatinine, Ser: 1.16 mg/dL (ref 0.61–1.24)
GFR, Estimated: >60 mL/min (ref >60)
Glucose, Bld: 119 mg/dL — ABNORMAL HIGH (ref 70–99)
Potassium: 3.7 mmol/L (ref 3.5–5.1)
Sodium: 137 mmol/L (ref 135–145)

## 2022-04-21 MED ORDER — SPIRONOLACTONE 25 MG PO TABS
25.0000 mg | ORAL_TABLET | Freq: Every day | ORAL | Status: DC
  Administered 2022-04-21 – 2022-04-23 (×3): 25 mg via ORAL
  Filled 2022-04-21 (×3): qty 1

## 2022-04-21 MED ORDER — TORSEMIDE 20 MG PO TABS
60.0000 mg | ORAL_TABLET | Freq: Every day | ORAL | Status: DC
  Administered 2022-04-21 – 2022-04-23 (×3): 60 mg via ORAL
  Filled 2022-04-21 (×3): qty 3

## 2022-04-21 MED ORDER — TORSEMIDE 20 MG PO TABS: 80.0000 mg | ORAL_TABLET | Freq: Every day | ORAL | Status: DC

## 2022-04-21 MED ORDER — SACUBITRIL-VALSARTAN 49-51 MG PO TABS
1.0000 | ORAL_TABLET | Freq: Two times a day (BID) | ORAL | Status: DC
  Administered 2022-04-21 – 2022-04-23 (×5): 1 via ORAL
  Filled 2022-04-21 (×5): qty 1

## 2022-04-21 NOTE — Progress Notes (Signed)
Pt is resting comfortably in bed, wants me to come back tomorrow.

## 2022-04-21 NOTE — TOC Progression Note (Signed)
Transition of Care [facility]) - Progression Note    Patient Details  Name: ANCIL DEWAN MRN: [id number] Date of Birth: [birth year]  Transition of [facility] CM/[place], [place] Phone Number: 04/21/2022, 11:36 AM  Clinical Narrative:      CSW met with pt in response to [facility] consult for food stamps and disability. Met with pt bedside who informs CSW that DSS received information that they needed and are processing his food stamps recertification; he does not need further assistance form CSW regarding this. He explains dissability assistance is regarding process of getting 100% vested in [place] services. He calls Rollene Fare who is assisting him with this process; she identifies herself as a Geologist, engineering. She requests clinicals from current admission be faxed to her at [phone]. Pt requests these records be faxed to her. CSW faxed clinicals.   He also states he needs a walker or rollator but that medicare won't pay because it hasn't been 5 years since he received his last one.   He also has a PCS assessment tomorrow that he wants to cancel and requests the phone number for the assessment agency which CSW provided him.       Expected Discharge Plan and Services                                                 Social Determinants of Health (SDOH) Interventions    Readmission Risk Interventions    01/31/2021   12:26 PM 01/31/2021    9:25 AM  Readmission Risk Prevention Plan  Transportation Screening Complete   PCP or Specialist Appt within 3-5 Days Complete Complete  HRI or Home Care Consult Complete Complete  Social Work Consult for [place] Planning/Counseling Patient refused Complete  Palliative Care Screening Not Applicable Not Applicable  Medication Review Press photographer) Complete Referral to Pharmacy

## 2022-04-21 NOTE — Progress Notes (Signed)
Occupational Therapy Treatment Patient Details Name: Luis [name] MRN: [id number] DOB: June 17, 1971 Today's Date: 04/21/2022   History of present illness Pt is 51 yo male who returns to [facility] hospital on 04/16/[facility]22. Pt underwent scheduled heart cath which demonstrated elevated filling pressures and low output. Pt directed from cath lab to floor for IV diuresis and inotropic support. PMH includes HTN, HLD, DMII, OSA, cocaine abuse, VT s/p ICD.   OT comments  Lavor is making incremental progress, with education and encouragement he was agreeable for hallway mobility to progress activity tolerance. Overall he was superivsion A with RW, he required several standing rest breaks and significantly increased time. OT to continue to follow acutely. POC remains appropriate.    Recommendations for follow up therapy are one component of a multi-disciplinary discharge planning process, led by the attending physician.  Recommendations may be updated based on patient status, additional functional criteria and insurance authorization.    Follow Up Recommendations  Home health OT    Assistance Recommended at Discharge PRN  Patient can return home with the following  Assist for transportation;Assistance with cooking/housework;A little help with bathing/dressing/bathroom;A little help with walking and/or transfers         Precautions / Restrictions Precautions Precautions: Fall;Other (comment) Precaution Comments: monitor sats Restrictions Weight Bearing Restrictions: No       Mobility Bed Mobility Overal bed mobility: Modified Independent                  Transfers Overall transfer level: Modified independent                       Balance Overall balance assessment: Needs assistance Sitting-balance support: No upper extremity supported, Feet supported Sitting balance-Leahy Scale: Good     Standing balance support: No upper extremity supported, During functional  activity Standing balance-Leahy Scale: Fair                             ADL either performed or assessed with clinical judgement   ADL Overall ADL's : Needs assistance/impaired                                     Functional mobility during ADLs: Supervision/safety General ADL Comments: pt declined all ADLs. session focused on activity tolerance    Extremity/Trunk Assessment Upper Extremity Assessment Upper Extremity Assessment: Overall WFL for tasks assessed   Lower Extremity Assessment Lower Extremity Assessment: Defer to PT evaluation        Vision   Vision Assessment?: No apparent visual deficits   Perception Perception Perception: Not tested   Praxis Praxis Praxis: Not tested    Cognition Arousal/Alertness: Awake/alert Behavior During Therapy: WFL for tasks assessed/performed Overall Cognitive Status: Within Functional Limits for tasks assessed                                 General Comments: requires encouragement to participate              General Comments 3L with VSS. pt required several standing rest breaks, cues for PLB    Pertinent Vitals/ Pain       Pain Assessment Pain Assessment: Faces Faces Pain Scale: No hurt Pain Intervention(s): Monitored during session   Frequency  Min 2X/week  Progress Toward Goals  OT Goals(current goals can now be found in the care plan section)  Progress towards OT goals: Progressing toward goals  Acute Rehab OT Goals Patient Stated Goal: home OT Goal Formulation: With patient Time For Goal Achievement: 05/02/22 Potential to Achieve Goals: Good ADL Goals Pt Will Perform Grooming: standing;Independently Pt Will Perform Lower Body Bathing: with modified independence;sit to/from stand Pt Will Perform Lower Body Dressing: with modified independence;sit to/from stand Pt Will Transfer to Toilet: with modified independence;ambulating Additional ADL Goal #1: Pt  will indep verbalize at least 3 energy conservation strategies to apply to the home setting  Plan Discharge plan remains appropriate       AM-PAC OT "6 Clicks" Daily Activity     Outcome Measure   Help from another person eating meals?: None Help from another person taking care of personal grooming?: A Little Help from another person toileting, which includes using toliet, bedpan, or urinal?: A Little Help from another person bathing (including washing, rinsing, drying)?: A Little Help from another person to put on and taking off regular upper body clothing?: None Help from another person to put on and taking off regular lower body clothing?: A Little 6 Click Score: 20    End of Session Equipment Utilized During Treatment: Rolling walker (2 wheels);Oxygen  OT Visit Diagnosis: Unsteadiness on feet (R26.81);Other abnormalities of gait and mobility (R26.89);Muscle weakness (generalized) (M62.81);Repeated falls (R29.6);History of falling (Z91.81)   Activity Tolerance Patient tolerated treatment well   Patient Left with call bell/phone within reach;in chair   Nurse Communication Mobility status        Time: 2952-8413 OT Time Calculation (min): 26 min  Charges: OT General Charges $OT Visit: 1 Visit OT Treatments $Therapeutic Activity: 23-37 mins   Elliot Cousin 04/21/2022, 4:41 PM

## 2022-04-21 NOTE — Progress Notes (Addendum)
Patient ID: Luis [name], male   DOB: March 06, 1971, 51 y.o.   MRN: [id number]     Advanced Heart Failure Rounding Note  PCP-Cardiologist: Glenetta Hew, MD   Subjective:    10/19: Cath- Elevated filling pressures + low output. Started on milrinone. 10/22: Milrinone stopped. AKI d/t overdiuresis.  10/23: RUE DVT. PICC line removed. Started Eliquis.  Scr stable 1.16. SBP 100s-120s.  Good diuresis yesterday with PO Torsemide. Reports he is still having some orthopnea.   Notes quite a bit of RUE pain, swelling and ecchymosis.   Objective:   Weight Range: 129.1 kg Body mass index is 45.94 kg/m.   Vital Signs:   Temp:  [98.3 F (36.8 C)-99.2 F (37.3 C)] 98.3 F (36.8 C) (10/24 0725) Pulse Rate:  [81-97] 81 (10/23 2106) Resp:  [20] 20 (10/24 0350) BP: (91-121)/(61-78) 121/78 (10/24 0350) SpO2:  [95 %-97 %] 95 % (10/24 0350) Weight:  [129.1 kg] 129.1 kg (10/24 0300) Last BM Date : 04/18/22  Weight change: Filed Weights   04/19/22 0450 04/20/22 0500 04/21/22 0300  Weight: 129 kg 130.6 kg 129.1 kg    Intake/Output:   Intake/Output Summary (Last 24 hours) at 04/21/2022 0806 Last data filed at 04/21/2022 0335 Gross per 24 hour  Intake 1668 ml  Output 4050 ml  Net -2382 ml      Physical Exam    General:  Sitting up in bed.  HEENT: normal Neck: supple. JVP difficult d/t neck size. Carotids 2+ bilat; no bruits.  Cor: PMI nondisplaced. Regular rate & rhythm. No rubs, gallops or murmurs. Lungs: diminished throughout Abdomen: soft, nontender, nondistended.  Extremities: no cyanosis, clubbing, rash, trace edema Neuro: alert & orientedx3, cranial nerves grossly intact. moves all 4 extremities w/o difficulty. Affect pleasant    Telemetry   BiV paced 80s-90s (personally reviewed)  EKG    N/A  Labs    CBC Recent Labs    04/20/22 0447 04/21/22 0111  WBC 9.6 9.8  HGB 14.1 14.5  HCT 43.5 44.4  MCV 88.2 87.9  PLT 232 [id number]   Basic Metabolic  Panel Recent Labs    04/20/22 0447 04/21/22 0111  NA 137 137  K 3.8 3.7  CL 98 98  CO2 29 29  GLUCOSE 116* 119*  BUN 24* 20  CREATININE 1.14 1.16  CALCIUM 8.5* 8.9   Liver Function Tests No results for input(s): "AST", "ALT", "ALKPHOS", "BILITOT", "PROT", "ALBUMIN" in the last 72 hours.  No results for input(s): "LIPASE", "AMYLASE" in the last 72 hours. Cardiac Enzymes No results for input(s): "CKTOTAL", "CKMB", "CKMBINDEX", "TROPONINI" in the last 72 hours.  BNP: BNP (last 3 results) Recent Labs    04/07/22 1607 04/14/22 1616 04/16/22 1808  BNP 180.1* 161.9* 336.9*    ProBNP (last 3 results) No results for input(s): "PROBNP" in the last 8760 hours.   D-Dimer No results for input(s): "DDIMER" in the last 72 hours. Hemoglobin A1C No results for input(s): "HGBA1C" in the last 72 hours. Fasting Lipid Panel No results for input(s): "CHOL", "HDL", "LDLCALC", "TRIG", "CHOLHDL", "LDLDIRECT" in the last 72 hours. Thyroid Function Tests No results for input(s): "TSH", "T4TOTAL", "T3FREE", "THYROIDAB" in the last 72 hours.  Invalid input(s): "FREET3"  Other results:   Imaging    VAS [place] UPPER EXTREMITY VENOUS DUPLEX  Result Date: 04/20/2022 UPPER VENOUS STUDY  Patient Name:  Luis [name]  Date of Exam:   04/20/2022 Medical Rec #: [id number]  Accession #:    [id number] Date of Birth: Apr 01, 1971               Patient Gender: M Patient Age:   37 years Exam Location:  [facility] Procedure:      VAS [place] UPPER EXTREMITY VENOUS DUPLEX Referring Phys: Ria Comment [facility] --------------------------------------------------------------------------------  Indications: Edema, and Right upper extremity PICC line Limitations: Bandages. Comparison Study: 03/07/22- limited right upper extremity venous duplex Performing Technologist: Maudry Mayhew MHA, RDMS, RVT, RDCS  Examination Guidelines: A complete evaluation includes B-mode imaging, spectral Doppler, color  Doppler, and power Doppler as needed of all accessible portions of each vessel. Bilateral testing is considered an integral part of a complete examination. Limited examinations for reoccurring indications may be performed as noted.  Right Findings: +----------+------------+---------+-----------+----------+---------------------+ RIGHT     CompressiblePhasicitySpontaneousProperties       Summary        +----------+------------+---------+-----------+----------+---------------------+ IJV           Full       Yes       Yes                                    +----------+------------+---------+-----------+----------+---------------------+ Subclavian    None                 No                  Acute thrombus                                                         surrounding PICC line +----------+------------+---------+-----------+----------+---------------------+ Axillary      Full       Yes       Yes                                    +----------+------------+---------+-----------+----------+---------------------+ Brachial      None                 No                 Acute surrounding                                                             PICC line       +----------+------------+---------+-----------+----------+---------------------+ Radial        Full                                                        +----------+------------+---------+-----------+----------+---------------------+ Ulnar         Full                                                        +----------+------------+---------+-----------+----------+---------------------+  Cephalic      Full                                                        +----------+------------+---------+-----------+----------+---------------------+ Basilic                                                Not visualized     +----------+------------+---------+-----------+----------+---------------------+   Left Findings: +----------+------------+---------+-----------+----------+-------+ LEFT      CompressiblePhasicitySpontaneousPropertiesSummary +----------+------------+---------+-----------+----------+-------+ Subclavian               Yes       Yes                      +----------+------------+---------+-----------+----------+-------+  Summary:  Right: Findings consistent with acute deep vein thrombosis involving the right subclavian vein and right brachial veins.  Left: No evidence of thrombosis in the subclavian.  *See table(s) above for measurements and observations.     Preliminary      Medications:     Scheduled Medications:  apixaban  10 mg Oral BID   Followed by   Derrill Memo ON 04/27/2022] apixaban  5 mg Oral BID   carvedilol  3.125 mg Oral BID WC   Chlorhexidine Gluconate Cloth  6 each Topical Daily   dapagliflozin propanediol  10 mg Oral Daily   digoxin  0.125 mg Oral Daily   DULoxetine  90 mg Oral Daily   insulin aspart  0-15 Units Subcutaneous TID AC & HS   insulin glargine-yfgn  50 Units Subcutaneous BID   nystatin cream   Topical BID   pantoprazole  40 mg Oral Daily   potassium chloride SA  40 mEq Oral Daily   prazosin  2 mg Oral QHS   rosuvastatin  20 mg Oral Daily   sacubitril-valsartan  1 tablet Oral BID   sodium chloride flush  10-40 mL Intracatheter Q12H   sodium chloride flush  3 mL Intravenous Q12H   spironolactone  12.5 mg Oral Daily   tetracaine  5 mg Intradermal Once   tetracaine  5 mg Intradermal Once   torsemide  40 mg Oral Daily   traZODone  200 mg Oral QHS    Infusions:  sodium chloride      PRN Medications: sodium chloride, acetaminophen, alum & mag hydroxide-simeth, guaiFENesin-dextromethorphan, hydrOXYzine, ondansetron (ZOFRAN) IV, mouth rinse, oxyCODONE, sodium chloride flush, sodium chloride flush    Patient Profile   51 y.o. male with h/o chronic systolic heart failure, VT s/p ICD (MDT CRT-D), HTN, HLD, Type 2 DM, OSA on CPAP and  h/o substance use (cocaine) referred for [facility] which demonstrated severe NICM w/ elevated filling pressures and low output. Direct admitted from cath lab for IV diuretics and initiation of inotropic support w/ milrinone.   Assessment/Plan   1. Acute on Chronic Systolic CHF:  Nonischemic cardiomyopathy.  MDT CRT-D device.  Echoes 2021-2023, LVEF range 25-35%, RV mildly reduced.  Reportedly diagnosed with nonischemic cardiomyopathy in 2012 in [place], etiology felt due to cocaine abuse.  [place] cardiology 03/07/2019 notes mention patient has CAD with history of angioplasty in the past. Unable to locate cath reports/ details regarding coronary anatomy in record. Most recent echo  in 2/23 showed EF 25%, mild LV dilation, mildly decreased RV function. RHC in 9/23 showed low CI at 1.82.  Ongoing NYHA class IIIb symptoms. Repeat RHC done this admission showed elevated filling pressures and low output, mRA 14, PAP 45/22, mPCWP 27, LVEDP 32, PA sat 60%, CI 1.77. LHC showed no significant coronary disease.  - Good diuresis with milrinone. Now stable off inotrope support. - Had mild AKI d/t overdiuresis. Given IV fluid back on 10/22.  - Volume difficult on exam. PICC out, so no CVP. Notes some orthopnea. Increase Torsemide to 60 mg daily. - Increase spiro to prior home dose 25 mg daily - Increase Entresto to prior home dose 49/51 BID - Continue Farxiga 10 daily.   - Continue Coreg to 3.125 mg bid - Continue Digoxin 0.125 mg daily, level low at admission.   - Suspect that with OHS/OSA, obesity, and his poor mobility that he will be a poor candidate for LVAD/transplant.  2. CAD: Documented in Care Everywhere, reported h/o angioplasty, details unknown (cannot find a cath report though prior CAD is referenced).  He has had atypical chest pain. LHC showed no significant CAD - Stopped Plavix since starting Eliquis.    - Continue Crestor 20 mg daily.  3. H/o VT: Has CRT-D w/ chronic systolic heart failure and QRS >  150 ms  - followed by [facility] device clinic  4. HTN: BP controlled.  - continue GDMT per above  5. Type 2DM - Hgb A1C >9.  - continue Semglee 50 units Angus bid.   - now on Farxiga. As above watch with fungal infection. Added antifungal powder.  6. Cocaine: Says he has quit for months now.    7. Depression: Depressed due to lack of social/family support and food insecurity  - continue Cymbalta  8. Jehovah's Witness.  9. OHS/OSA: He uses oxygen during the day and CPAP at night.  This likely plays a role in his dyspnea.  10. Yeast infection under pannus: He will use Nystatin.  - monitor w/ Farxiga  11. AKI: Creatinine up to 1.58 on 10/22. Diuretics and entresto/spiro held. Given IV fluid back. Scr improved to 1.16. - BP better - Continue to monitor 12. RUE DVT: - Provoked. PICC line pulled. Had refused some doses of Lovenox. - Continue Eliquis 10 BID X 1 week then reduce to 5 mg BID. Need to continue at least 3 months - Quite a bit of pain and swelling today. Discussed with RN - elevate extremity and use warm compresses  Continue to mobilize. Arranging [place] PT/OT at discharge.  SDOH- HF CSW assisting with food stamps and disability (apps pending).  Plan for discharge home tomorrow     Leata Mouse 04/21/2022 8:06 AM  Patient seen with NP, agree with the above note.   RUE DVT at PICC site diagnosed yesterday, started on Eliquis.  Arm feels better this morning.   No dyspnea at rest but short of breath walking in hall.   Good UOP yesterday with torsemide, weight continues to come down.  SBP 120s.   General: NAD Neck: JVP difficult, no thyromegaly or thyroid nodule.  Lungs: Clear to auscultation bilaterally with normal respiratory effort. CV: Nondisplaced PMI.  Heart regular S1/S2, no S3/S4, no murmur.  No peripheral edema.   Abdomen: Soft, nontender, no hepatosplenomegaly, no distention.  Skin: Intact without lesions or rashes.  Neurologic: Alert and oriented x 3.  Psych:  Normal affect. Extremities: No clubbing or cyanosis.  HEENT: Normal.   I  think his volume status is ok now.  Suspect he has residual dyspnea due to deconditioning and OHS/OSA.   - Increase torsemide to 60 mg daily, this will likely be his home dose.  - Increase Entresto to 49/51 bid.  - Increase spironolactone to 25 mg daily.   RUE DVT in setting of PICC, will need at least 3 months anticoagulation.  PICC out. Still with significant RUE pain but improving.  Breathing actually is improved so do not think he needs to get PE evaluation.  - Continue Eliquis.  - Warm compresses.   Continue to mobilize. Think we should be able to get him home tomorrow when arm pain is better controlled.   Loralie Champagne 04/21/2022 9:20 AM

## 2022-04-21 NOTE — Progress Notes (Signed)
Pt would like me to come back later for ambulation b/c he is on the phone with insurance.  Luis [name] 04/21/2022 11:52 AM

## 2022-04-21 NOTE — TOC CM/SW Note (Addendum)
HF TOC CM spoke to pt. Pt has oxygen ([employer]), CPAP, scale, and cane at home. 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 Pt had [facility] in the past. Contacted [facility] rep with new referral. Will need HH orders. [place], Heart Failure TOC CM [phone]

## 2022-04-21 NOTE — Progress Notes (Signed)
CPAP set up ready at bedside, pt stated he will place on himself when ready.

## 2022-04-22 ENCOUNTER — Other Ambulatory Visit: Payer: Self-pay

## 2022-04-22 DIAGNOSIS — I5022 Chronic systolic (congestive) heart failure: Secondary | ICD-10-CM

## 2022-04-22 LAB — BASIC METABOLIC PANEL
Anion gap: 10 (ref 5–15)
BUN: 24 mg/dL — ABNORMAL HIGH (ref 6–20)
CO2: 28 mmol/L (ref 22–32)
Calcium: 9 mg/dL (ref 8.9–10.3)
Chloride: 97 mmol/L — ABNORMAL LOW (ref 98–111)
Creatinine, Ser: 1.14 mg/dL (ref 0.61–1.24)
GFR, Estimated: >60 mL/min (ref >60)
Glucose, Bld: 177 mg/dL — ABNORMAL HIGH (ref 70–99)
Potassium: 4.4 mmol/L (ref 3.5–5.1)
Sodium: 135 mmol/L (ref 135–145)

## 2022-04-22 LAB — CBC
HCT: 46.8 % (ref 39.0–52.0)
Hemoglobin: 15.1 g/dL (ref 13.0–17.0)
MCH: 28.2 pg (ref 26.0–34.0)
MCHC: 32.3 g/dL (ref 30.0–36.0)
MCV: 87.3 fL (ref 80.0–100.0)
Platelets: 246 10*3/uL (ref 150–400)
RBC: 5.36 MIL/uL (ref 4.22–5.81)
RDW: 13.4 % (ref 11.5–15.5)
WBC: 9.7 10*3/uL (ref 4.0–10.5)
nRBC: 0 % (ref 0.0–0.2)

## 2022-04-22 LAB — GLUCOSE, CAPILLARY
Glucose-Capillary: 169 mg/dL — ABNORMAL HIGH (ref 70–99)
Glucose-Capillary: 181 mg/dL — ABNORMAL HIGH (ref 70–99)
Glucose-Capillary: 161 mg/dL — ABNORMAL HIGH (ref 70–99)
Glucose-Capillary: 229 mg/dL — ABNORMAL HIGH (ref 70–99)

## 2022-04-22 MED ORDER — POTASSIUM CHLORIDE CRYS ER 20 MEQ PO TBCR
20.0000 meq | EXTENDED_RELEASE_TABLET | Freq: Every day | ORAL | Status: DC
  Administered 2022-04-22 – 2022-04-23 (×2): 20 meq via ORAL
  Filled 2022-04-22 (×2): qty 1

## 2022-04-22 NOTE — Progress Notes (Signed)
Physical Therapy Treatment Patient Details Name: Luis [name] MRN: [id number] DOB: 03-14-1971 Today's Date: 04/22/2022   History of Present Illness Pt is 51 yo male who returns to [facility] hospital on 04/16/2022. Pt underwent scheduled heart cath which demonstrated elevated filling pressures and low output. Pt directed from cath lab to floor for IV diuresis and inotropic support. PMH includes HTN, HLD, DMII, OSA, cocaine abuse, VT s/p ICD.    PT Comments    Pt supine in bed in darkened room, agreeable to ambulation with therapist after using bathroom. Pt able to ambulate into bathroom at supervision level. Ambulated in hallway with RW for safety, pt experiences 4x posterior LoB, pt provide light min A for steadying. Pt with complaints of dizziness and diffuse chest pain with ambulation. Pt is making slow progress towards his goals. D/c plans remain appropriate at this time. PT will continue to follow acutely.   Recommendations for follow up therapy are one component of a multi-disciplinary discharge planning process, led by the attending physician.  Recommendations may be updated based on patient status, additional functional criteria and insurance authorization.  Follow Up Recommendations  Home health PT     Assistance Recommended at Discharge PRN  Patient can return home with the following A little help with bathing/dressing/bathroom;Assistance with cooking/housework;Assist for transportation   Equipment Recommendations  None recommended by PT       Precautions / Restrictions Precautions Precautions: Fall;Other (comment) Precaution Comments: monitor sats Restrictions Weight Bearing Restrictions: No     Mobility  Bed Mobility Overal bed mobility: Modified Independent                  Transfers Overall transfer level: Modified independent                      Ambulation/Gait Ambulation/Gait assistance: Min assist, Supervision Gait Distance (Feet): 125  Feet (15) Assistive device: Rolling walker (2 wheels), None Gait Pattern/deviations: Step-through pattern Gait velocity: reduced Gait velocity interpretation: <1.8 ft/sec, indicate of risk for recurrent falls   General Gait Details: supervision for ambulation into and out of bathroom with no AD, slowed step-through gait in the hallway, 3x standing rest breaks for fatigue,  with 4x posterior LOB, provided light min A for steadying, pt with c/o of dizziness throughout session which pt reports is his normal      Balance Overall balance assessment: Needs assistance Sitting-balance support: No upper extremity supported, Feet supported Sitting balance-Leahy Scale: Good     Standing balance support: No upper extremity supported, During functional activity Standing balance-Leahy Scale: Fair                              Cognition Arousal/Alertness: Awake/alert Behavior During Therapy: WFL for tasks assessed/performed Overall Cognitive Status: Within Functional Limits for tasks assessed                                             General Comments General comments (skin integrity, edema, etc.): ambulates on 3L O2 via [place], VSS, pt with complaint of transient diffuse chest pain during ambulation      Pertinent Vitals/Pain Pain Assessment Pain Assessment: Faces Faces Pain Scale: Hurts a little bit Pain Location: generalized with mobility Pain Descriptors / Indicators: Discomfort, Grimacing, Guarding Pain Intervention(s): Limited activity within patient's tolerance, Monitored during  session, Repositioned     PT Goals (current goals can now be found in the care plan section) Acute Rehab PT Goals Patient Stated Goal: to improve heart function and stay at home, stop returning to hospital PT Goal Formulation: With patient Time For Goal Achievement: 05/01/22 Potential to Achieve Goals: Fair Progress towards PT goals: Progressing toward goals     Frequency    Min 3X/week      PT Plan Current plan remains appropriate       AM-PAC PT "6 Clicks" Mobility   Outcome Measure  Help needed turning from your back to your side while in a flat bed without using bedrails?: None Help needed moving from lying on your back to sitting on the side of a flat bed without using bedrails?: None Help needed moving to and from a bed to a chair (including a wheelchair)?: None Help needed standing up from a chair using your arms (e.g., wheelchair or bedside chair)?: None Help needed to walk in hospital room?: A Little Help needed climbing 3-5 steps with a railing? : A Little 6 Click Score: 22    End of Session Equipment Utilized During Treatment: Oxygen Activity Tolerance: Patient limited by fatigue Patient left: with call bell/phone within reach;in chair Nurse Communication: Mobility status PT Visit Diagnosis: Other abnormalities of gait and mobility (R26.89)     Time: 1021-1040 PT Time Calculation (min) (ACUTE ONLY): 19 min  Charges:  $Therapeutic Exercise: 8-22 mins                     Yamilee Harmes B. Migdalia Dk PT, DPT Acute Rehabilitation Services Please use secure chat or  Call Office [phone]    [place] 04/22/2022, 11:31 AM

## 2022-04-22 NOTE — Progress Notes (Addendum)
Patient ID: Luis [name], male   DOB: Nov 07, 1970, 51 y.o.   MRN: [id number]     Advanced Heart Failure Rounding Note  PCP-Cardiologist: Glenetta Hew, MD   Subjective:    10/19: Cath- Elevated filling pressures + low output. Started on milrinone. 10/22: Milrinone stopped. AKI d/t overdiuresis.  10/23: RUE DVT. PICC line removed. Started Eliquis.  Back on home diuretic and GDMT regimen. BP and renal function stable.   Continues with intermittent atypical chest pain. Some soreness RUE but swelling seems to be improving.   Objective:   Weight Range: 128.5 kg Body mass index is 45.72 kg/m.   Vital Signs:   Temp:  [97.7 F (36.5 C)-99 F (37.2 C)] 98.5 F (36.9 C) (10/25 0605) Pulse Rate:  [89-96] 91 (10/25 0605) Resp:  [18-20] 20 (10/24 1940) BP: (100-117)/(58-78) 113/78 (10/25 0605) SpO2:  [96 %-100 %] 96 % (10/25 0605) Weight:  [128.5 kg] 128.5 kg (10/25 0605) Last BM Date : 04/18/22  Weight change: Filed Weights   04/20/22 0500 04/21/22 0300 04/22/22 0605  Weight: 130.6 kg 129.1 kg 128.5 kg    Intake/Output:   Intake/Output Summary (Last 24 hours) at 04/22/2022 0753 Last data filed at 04/22/2022 [id number] Gross per 24 hour  Intake 720 ml  Output 3050 ml  Net -2330 ml      Physical Exam    General:  Well appearing. Sitting up in bed. HEENT: normal Neck: supple. JVD difficult d/t thick neck. Carotids 2+ bilat; no bruits.  Cor: PMI nondisplaced. Regular rate & rhythm. No rubs, gallops or murmurs. Lungs: clear Abdomen: obese, soft, nontender, nondistended.  Extremities: no cyanosis, clubbing, rash, trace edema Neuro: alert & orientedx3, cranial nerves grossly intact. moves all 4 extremities w/o difficulty. Affect pleasant     Telemetry   BiV paced 90s-100s  EKG    N/A  Labs    CBC Recent Labs    04/21/22 0111 04/22/22 0143  WBC 9.8 9.7  HGB 14.5 15.1  HCT 44.4 46.8  MCV 87.9 87.3  PLT 239 0000000   Basic Metabolic Panel Recent Labs     04/21/22 0111 04/22/22 0143  NA 137 135  K 3.7 4.4  CL 98 97*  CO2 29 28  GLUCOSE 119* 177*  BUN 20 24*  CREATININE 1.16 1.14  CALCIUM 8.9 9.0   Liver Function Tests No results for input(s): "AST", "ALT", "ALKPHOS", "BILITOT", "PROT", "ALBUMIN" in the last 72 hours.  No results for input(s): "LIPASE", "AMYLASE" in the last 72 hours. Cardiac Enzymes No results for input(s): "CKTOTAL", "CKMB", "CKMBINDEX", "TROPONINI" in the last 72 hours.  BNP: BNP (last 3 results) Recent Labs    04/07/22 1607 04/14/22 1616 04/16/22 1808  BNP 180.1* 161.9* 336.9*    ProBNP (last 3 results) No results for input(s): "PROBNP" in the last 8760 hours.   D-Dimer No results for input(s): "DDIMER" in the last 72 hours. Hemoglobin A1C No results for input(s): "HGBA1C" in the last 72 hours. Fasting Lipid Panel No results for input(s): "CHOL", "HDL", "LDLCALC", "TRIG", "CHOLHDL", "LDLDIRECT" in the last 72 hours. Thyroid Function Tests No results for input(s): "TSH", "T4TOTAL", "T3FREE", "THYROIDAB" in the last 72 hours.  Invalid input(s): "FREET3"  Other results:   Imaging    No results found.   Medications:     Scheduled Medications:  apixaban  10 mg Oral BID   Followed by   Derrill Memo ON 04/27/2022] apixaban  5 mg Oral BID   carvedilol  3.125 mg Oral BID WC  Chlorhexidine Gluconate Cloth  6 each Topical Daily   dapagliflozin propanediol  10 mg Oral Daily   digoxin  0.125 mg Oral Daily   DULoxetine  90 mg Oral Daily   insulin aspart  0-15 Units Subcutaneous TID AC & HS   insulin glargine-yfgn  50 Units Subcutaneous BID   nystatin cream   Topical BID   pantoprazole  40 mg Oral Daily   potassium chloride SA  40 mEq Oral Daily   prazosin  2 mg Oral QHS   rosuvastatin  20 mg Oral Daily   sacubitril-valsartan  1 tablet Oral BID   sodium chloride flush  10-40 mL Intracatheter Q12H   sodium chloride flush  3 mL Intravenous Q12H   spironolactone  25 mg Oral Daily   tetracaine  5  mg Intradermal Once   tetracaine  5 mg Intradermal Once   torsemide  60 mg Oral Daily   traZODone  200 mg Oral QHS    Infusions:  sodium chloride      PRN Medications: sodium chloride, acetaminophen, alum & mag hydroxide-simeth, guaiFENesin-dextromethorphan, hydrOXYzine, ondansetron (ZOFRAN) IV, mouth rinse, oxyCODONE, sodium chloride flush, sodium chloride flush    Patient Profile   51 y.o. male with h/o chronic systolic heart failure, VT s/p ICD (MDT CRT-D), HTN, HLD, Type 2 DM, OSA on CPAP and h/o substance use (cocaine) referred for [facility] which demonstrated severe NICM w/ elevated filling pressures and low output. Direct admitted from cath lab for IV diuretics and initiation of inotropic support w/ milrinone.   Assessment/Plan   1. Acute on Chronic Systolic CHF:  Nonischemic cardiomyopathy.  MDT CRT-D device.  Echoes 2021-2023, LVEF range 25-35%, RV mildly reduced.  Reportedly diagnosed with nonischemic cardiomyopathy in 2012 in [place], etiology felt due to cocaine abuse.  [facility] cardiology 03/07/2019 notes mention patient has CAD with history of angioplasty in the past. Unable to locate cath reports/ details regarding coronary anatomy in record. Most recent echo in 2/23 showed EF 25%, mild LV dilation, mildly decreased RV function. RHC in 9/23 showed low CI at 1.82.  Ongoing NYHA class IIIb symptoms. Repeat RHC done this admission showed elevated filling pressures and low output, mRA 14, PAP 45/22, mPCWP 27, LVEDP 32, PA sat 60%, CI 1.77. LHC showed no significant coronary disease.  - Good diuresis with milrinone. Now stable off inotrope support. - Had mild AKI d/t overdiuresis. Given IV fluid back on 10/22. Scr back to baseline. - Volume looks okay. Continue Torsemide 60 mg daily - Continue spiro 25 mg daily - Continue Entresto 49/51 mg BID - Continue Farxiga 10 daily.   - Continue Coreg to 3.125 mg bid - Continue Digoxin 0.125 mg daily, level low at admission.   - Suspect that  with OHS/OSA, obesity, and his poor mobility that he will be a poor candidate for LVAD/transplant.  2. CAD: Documented in Care Everywhere, reported h/o angioplasty, details unknown (cannot find a cath report though prior CAD is referenced).  He has had atypical chest pain. LHC showed no significant CAD - Stopped Plavix since starting Eliquis.    - Continue Crestor 20 mg daily.  3. H/o VT: Has CRT-D w/ chronic systolic heart failure and QRS > 150 ms  - followed by [facility] device clinic  4. HTN: BP controlled.  - continue GDMT per above  5. Type 2DM - Hgb A1C >9.  - continue Semglee 50 units [place] bid.   - now on Farxiga. As above watch with fungal infection. Added  antifungal powder.  6. Cocaine: Says he has quit for months now.    7. Depression: Depressed due to lack of social/family support and food insecurity  - continue Cymbalta  8. Jehovah's Witness.  9. OHS/OSA: He uses oxygen during the day and CPAP at night.  This likely plays a role in his dyspnea.  10. Yeast infection under pannus: He will use Nystatin.  - monitor w/ Farxiga  11. AKI: Creatinine up to 1.58 on 10/22. Diuretics and entresto/spiro held. Given IV fluid back. Scr improved to 1.14. - BP better - Continue to monitor 12. RUE DVT: - Provoked. PICC line pulled. Had refused some doses of Lovenox. - Continue Eliquis 10 BID X 1 week then reduce to 5 mg BID. Need to continue at least 3 months   HH PT/OT arranged.  SDOH- HF CSW assisting with food stamps and disability (apps pending).  Medically ready for discharge. States he doesn't have a ride today, offered to arrange transportation. He declined discharge but states he will be ready tomorrow.    FINCH, LINDSAY N 04/22/2022 7:53 AM  Patient seen with PA, agree with the above note.  Breathing improved but still has chest heaviness. Weight down 1 lb.    General: NAD, obese.  Neck: Thick. No JVD, no thyromegaly or thyroid nodule.  Lungs: Clear to auscultation  bilaterally with normal respiratory effort. CV: Nondisplaced PMI.  Heart regular S1/S2, no S3/S4, no murmur.  No peripheral edema.   Abdomen: Soft, nontender, no hepatosplenomegaly, no distention.  Skin: Intact without lesions or rashes.  Neurologic: Alert and oriented x 3.  Psych: Normal affect. Extremities: No clubbing or cyanosis.  HEENT: Normal.   Continue current medication regimen.  He does not look volume overloaded. Creatinine stable.   Ready for home.  Apparently cannot get in his house until daughter can come unlock it tomorrow.   Will plan discharge in am.   Loralie Champagne 04/22/2022 1:13 PM

## 2022-04-22 NOTE — Progress Notes (Signed)
CPAP set up and ready at bedside, pt states he will place on himself when ready.

## 2022-04-22 NOTE — Progress Notes (Signed)
Mobility Specialist Progress Note   04/22/22 1400  Mobility  Activity Refused mobility   Patient declined for unspecified reasons. Will f/u as time permits.  [place] Dereon Corkery, [place], [place]  Office: [phone]

## 2022-04-22 NOTE — Progress Notes (Addendum)
Seen pt for ambulation but pt reported that he's having chest pain. Nurse aware will f/u later if able.

## 2022-04-23 ENCOUNTER — Other Ambulatory Visit (HOSPITAL_COMMUNITY): Payer: Self-pay

## 2022-04-23 LAB — BASIC METABOLIC PANEL
Anion gap: 9 (ref 5–15)
BUN: 25 mg/dL — ABNORMAL HIGH (ref 6–20)
CO2: 28 mmol/L (ref 22–32)
Calcium: 8.6 mg/dL — ABNORMAL LOW (ref 8.9–10.3)
Chloride: 95 mmol/L — ABNORMAL LOW (ref 98–111)
Creatinine, Ser: 1.27 mg/dL — ABNORMAL HIGH (ref 0.61–1.24)
GFR, Estimated: >60 mL/min (ref >60)
Glucose, Bld: 175 mg/dL — ABNORMAL HIGH (ref 70–99)
Potassium: 3.9 mmol/L (ref 3.5–5.1)
Sodium: 132 mmol/L — ABNORMAL LOW (ref 135–145)

## 2022-04-23 LAB — CBC
HCT: 43.7 % (ref 39.0–52.0)
Hemoglobin: 14.6 g/dL (ref 13.0–17.0)
MCH: 28.8 pg (ref 26.0–34.0)
MCHC: 33.4 g/dL (ref 30.0–36.0)
MCV: 86.2 fL (ref 80.0–100.0)
Platelets: 257 10*3/uL (ref 150–400)
RBC: 5.07 MIL/uL (ref 4.22–5.81)
RDW: 13.2 % (ref 11.5–15.5)
WBC: 9.7 10*3/uL (ref 4.0–10.5)
nRBC: 0 % (ref 0.0–0.2)

## 2022-04-23 LAB — GLUCOSE, CAPILLARY
Glucose-Capillary: 136 mg/dL — ABNORMAL HIGH (ref 70–99)
Glucose-Capillary: 160 mg/dL — ABNORMAL HIGH (ref 70–99)

## 2022-04-23 MED ORDER — APIXABAN 5 MG PO TABS
ORAL_TABLET | ORAL | 5 refills | Status: DC
  Filled 2022-04-23: qty 65, 30d supply, fill #0

## 2022-04-23 MED ORDER — TRAZODONE HCL 100 MG PO TABS
200.0000 mg | ORAL_TABLET | Freq: Every day | ORAL | 0 refills | Status: DC
  Filled 2022-04-23: qty 28, 14d supply, fill #0

## 2022-04-23 MED ORDER — SPIRONOLACTONE 25 MG PO TABS
25.0000 mg | ORAL_TABLET | Freq: Every day | ORAL | 5 refills | Status: DC
  Filled 2022-04-23: qty 30, 30d supply, fill #0

## 2022-04-23 MED ORDER — DAPAGLIFLOZIN PROPANEDIOL 10 MG PO TABS
10.0000 mg | ORAL_TABLET | Freq: Every day | ORAL | 5 refills | Status: DC
  Filled 2022-04-23: qty 30, 30d supply, fill #0

## 2022-04-23 MED ORDER — APIXABAN 5 MG PO TABS
ORAL_TABLET | ORAL | 5 refills | Status: DC
  Filled 2022-04-23: qty 65, fill #0

## 2022-04-23 MED ORDER — SACUBITRIL-VALSARTAN 49-51 MG PO TABS
1.0000 | ORAL_TABLET | Freq: Two times a day (BID) | ORAL | 5 refills | Status: DC
  Filled 2022-04-23: qty 60, 30d supply, fill #0

## 2022-04-23 MED ORDER — CARVEDILOL 3.125 MG PO TABS
3.1250 mg | ORAL_TABLET | Freq: Two times a day (BID) | ORAL | 5 refills | Status: DC
  Filled 2022-04-23: qty 60, 30d supply, fill #0

## 2022-04-23 MED ORDER — TORSEMIDE 20 MG PO TABS
60.0000 mg | ORAL_TABLET | Freq: Every day | ORAL | 5 refills | Status: DC
  Filled 2022-04-23: qty 90, 30d supply, fill #0

## 2022-04-23 MED ORDER — DIGOXIN 125 MCG PO TABS
0.1250 mg | ORAL_TABLET | Freq: Every day | ORAL | 5 refills | Status: DC
  Filled 2022-04-23: qty 30, 30d supply, fill #0

## 2022-04-23 NOTE — Progress Notes (Signed)
H&V Care Navigation CSW Progress Note  Clinical Social Worker met with patient to assist with financial and food insecurity.  Patient is participating in a Managed Medicaid Plan:  No  Patient referred to [place] by HF [place] manager to assist with financial concerns related to obtaining a rolling walker and address food insecurity. CSW provided assistance through the Patient Care Fund for the rolling walker and gave patient gift cards to obtain food. Patient grateful for the support and assistance. CSW available if needed through AHF clinic. Raquel Sarna, [place], [place]   [place]: Food Insecurity Present (04/23/2022)  Housing: Low Risk  (04/18/2022)  Transportation Needs: No Transportation Needs (04/18/2022)  Utilities: Not At Risk (04/18/2022)  Alcohol Screen: Low Risk  (03/17/2022)  Depression (PHQ2-9): Medium Risk (03/30/2022)  Financial Resource Strain: Low Risk  (03/17/2022)  Tobacco Use: Medium Risk (04/18/2022)

## 2022-04-23 NOTE — TOC CM/SW Note (Addendum)
Sent updated order for Rollator to [place], [place]. Per HF CSW, [employer] processed through pt's Medicaid.   HF TOC CM contacted [place] and pt was delivered a [employer]. Order was for [employer] with seat. [employer] will pick up [place] and ship Rollator to home. [place], Heart Failure TOC CM [phone]

## 2022-04-23 NOTE — Progress Notes (Signed)
AVS instructions given to pt. IV x1 and portable tele removed. No further questions or concerns. Waiting for meds and walker to be delivered to pt's room. Pt's sister scheduled to arrive at noon for discharge.

## 2022-04-23 NOTE — Care Management Important Message (Signed)
Important Message  Patient Details  Name: Luis [name] MRN: [id number] Date of Birth: [birth year]   Medicare Important Message Given:  Yes     Shelda Altes 04/23/2022, 8:51 AM

## 2022-04-23 NOTE — Progress Notes (Addendum)
Patient ID: Luis [name], male   DOB: July 25, 1970, 51 y.o.   MRN: [id number]     Advanced Heart Failure Rounding Note  PCP-Cardiologist: Glenetta Hew, MD   Subjective:    10/19: Cath- Elevated filling pressures + low output. Started on milrinone. 10/22: Milrinone stopped. AKI d/t overdiuresis.  10/23: RUE DVT. PICC line removed. Started Eliquis.  Back on home diuretic and GDMT regimen. Scr up just slightly, 1.14>1.27  Feels okay. Reports his daughter is able to come pick him up today.   Objective:   Weight Range: 128.7 kg Body mass index is 45.79 kg/m.   Vital Signs:   Temp:  [98 F (36.7 C)-98.1 F (36.7 C)] 98.1 F (36.7 C) (10/26 0448) Pulse Rate:  [84-99] 88 (10/26 0448) Resp:  [18-20] 20 (10/26 0448) BP: (109-129)/(65-81) 129/68 (10/26 0448) SpO2:  [90 %-97 %] 96 % (10/26 0448) Weight:  [128.7 kg] 128.7 kg (10/26 0116) Last BM Date : 04/22/22 (per pt)  Weight change: Filed Weights   04/21/22 0300 04/22/22 0605 04/23/22 0116  Weight: 129.1 kg 128.5 kg 128.7 kg    Intake/Output:   Intake/Output Summary (Last 24 hours) at 04/23/2022 0759 Last data filed at 04/23/2022 0301 Gross per 24 hour  Intake 597 ml  Output 3600 ml  Net -3003 ml      Physical Exam    General:  Lying comfortably in bed HEENT: normal Neck: supple. JVD difficult to assess d/t thick neck. Carotids 2+ bilat; no bruits.  Cor: PMI nondisplaced. Regular rate & rhythm. No rubs, gallops or murmurs. Lungs: clear Abdomen: obese, soft, nontender, nondistended.  Extremities: no cyanosis, clubbing, rash, edema Neuro: alert & orientedx3, cranial nerves grossly intact. moves all 4 extremities w/o difficulty. Affect flat    Telemetry   BiV paced 80s-90s  EKG    N/A  Labs    CBC Recent Labs    04/22/22 0143 04/23/22 0055  WBC 9.7 9.7  HGB 15.1 14.6  HCT 46.8 43.7  MCV 87.3 86.2  PLT 246 [id number]   Basic Metabolic Panel Recent Labs    04/22/22 0143 04/23/22 0055  NA 135  132*  K 4.4 3.9  CL 97* 95*  CO2 28 28  GLUCOSE 177* 175*  BUN 24* 25*  CREATININE 1.14 1.27*  CALCIUM 9.0 8.6*   Liver Function Tests No results for input(s): "AST", "ALT", "ALKPHOS", "BILITOT", "PROT", "ALBUMIN" in the last 72 hours.  No results for input(s): "LIPASE", "AMYLASE" in the last 72 hours. Cardiac Enzymes No results for input(s): "CKTOTAL", "CKMB", "CKMBINDEX", "TROPONINI" in the last 72 hours.  BNP: BNP (last 3 results) Recent Labs    04/07/22 1607 04/14/22 1616 04/16/22 1808  BNP 180.1* 161.9* 336.9*    ProBNP (last 3 results) No results for input(s): "PROBNP" in the last 8760 hours.   D-Dimer No results for input(s): "DDIMER" in the last 72 hours. Hemoglobin A1C No results for input(s): "HGBA1C" in the last 72 hours. Fasting Lipid Panel No results for input(s): "CHOL", "HDL", "LDLCALC", "TRIG", "CHOLHDL", "LDLDIRECT" in the last 72 hours. Thyroid Function Tests No results for input(s): "TSH", "T4TOTAL", "T3FREE", "THYROIDAB" in the last 72 hours.  Invalid input(s): "FREET3"  Other results:   Imaging    No results found.   Medications:     Scheduled Medications:  apixaban  10 mg Oral BID   Followed by   Derrill Memo ON 04/27/2022] apixaban  5 mg Oral BID   carvedilol  3.125 mg Oral BID WC   Chlorhexidine  Gluconate Cloth  6 each Topical Daily   dapagliflozin propanediol  10 mg Oral Daily   digoxin  0.125 mg Oral Daily   DULoxetine  90 mg Oral Daily   insulin aspart  0-15 Units Subcutaneous TID AC & HS   insulin glargine-yfgn  50 Units Subcutaneous BID   nystatin cream   Topical BID   pantoprazole  40 mg Oral Daily   potassium chloride SA  20 mEq Oral Daily   prazosin  2 mg Oral QHS   rosuvastatin  20 mg Oral Daily   sacubitril-valsartan  1 tablet Oral BID   sodium chloride flush  10-40 mL Intracatheter Q12H   sodium chloride flush  3 mL Intravenous Q12H   spironolactone  25 mg Oral Daily   torsemide  60 mg Oral Daily   traZODone  200  mg Oral QHS    Infusions:  sodium chloride      PRN Medications: sodium chloride, acetaminophen, alum & mag hydroxide-simeth, guaiFENesin-dextromethorphan, hydrOXYzine, ondansetron (ZOFRAN) IV, mouth rinse, oxyCODONE, sodium chloride flush, sodium chloride flush    Patient Profile   51 y.o. male with h/o chronic systolic heart failure, VT s/p ICD (MDT CRT-D), HTN, HLD, Type 2 DM, OSA on CPAP and h/o substance use (cocaine) referred for [facility] which demonstrated severe NICM w/ elevated filling pressures and low output. Direct admitted from cath lab for IV diuretics and initiation of inotropic support w/ milrinone.   Assessment/Plan   1. Acute on Chronic Systolic CHF:  Nonischemic cardiomyopathy.  MDT CRT-D device.  Echoes 2021-2023, LVEF range 25-35%, RV mildly reduced.  Reportedly diagnosed with nonischemic cardiomyopathy in 2012 in [place], etiology felt due to cocaine abuse.  [facility]ology 03/07/2019 notes mention patient has CAD with history of angioplasty in the past. Unable to locate cath reports/ details regarding coronary anatomy in record. Most recent echo in 2/23 showed EF 25%, mild LV dilation, mildly decreased RV function. RHC in 9/23 showed low CI at 1.82.  Ongoing NYHA class IIIb symptoms. Repeat RHC done this admission showed elevated filling pressures and low output, mRA 14, PAP 45/22, mPCWP 27, LVEDP 32, PA sat 60%, CI 1.77. LHC showed no significant coronary disease.  - Good diuresis with milrinone. Now stable off inotrope support. - Had mild AKI d/t overdiuresis. Given IV fluid back on 10/22 with improvement. - Volume looks okay. Continue Torsemide 60 mg daily - Continue spiro 25 mg daily - Continue Entresto 49/51 mg BID - Continue Farxiga 10 daily.   - Continue Coreg to 3.125 mg bid - Continue Digoxin 0.125 mg daily, level low at admission.   - Suspect that with OHS/OSA, obesity, and his poor mobility that he will be a poor candidate for LVAD/transplant.  2. CAD:  Documented in Care Everywhere, reported h/o angioplasty, details unknown (cannot find a cath report though prior CAD is referenced).  He has had atypical chest pain. LHC showed no significant CAD - Stopped Plavix since starting Eliquis.    - Continue Crestor 20 mg daily.  3. H/o VT: Has CRT-D w/ chronic systolic heart failure and QRS > 150 ms  - followed by [facility] device clinic  4. HTN: BP controlled.  - continue GDMT per above  5. Type 2DM - Hgb A1C >9.  - Resume home insulin and trulicity at discharge. - now on Farxiga. As above watch with fungal infection. Added antifungal powder.  6. Cocaine: Says he has quit for months now.    7. Depression: Depressed due to  lack of social/family support and food insecurity  - continue Cymbalta  8. Jehovah's Witness.  9. OHS/OSA: He uses oxygen during the day and CPAP at night.  This likely plays a role in his dyspnea.  10. Yeast infection under pannus: He will use Nystatin.  - monitor w/ Farxiga  11. AKI: Creatinine up to 1.58 on 10/22. Diuretics and entresto/spiro held. Given IV fluid back. Scr improved. 12. RUE DVT: - Provoked. PICC line pulled. Had refused some doses of Lovenox. - Continue Eliquis 10 BID X 1 week then reduce to 5 mg BID. Need to continue at least 3 months   HH PT/OT arranged.  SDOH- HF CSW assisting with food stamps and disability (apps pending).  Discharge home today    [facility], [place] Parents 04/23/2022 7:59 AM  Patient seen with PA, agree with the above note.   Stable today, no dyspnea at rest.  Weight unchanged.  Creatinine 1.27.   General: NAD Neck: No JVD, no thyromegaly or thyroid nodule.  Lungs: Clear to auscultation bilaterally with normal respiratory effort. CV: Nondisplaced PMI.  Heart regular S1/S2, no S3/S4, no murmur.  No peripheral edema.   Abdomen: Soft, nontender, no hepatosplenomegaly, no distention.  Skin: Intact without lesions or rashes.  Neurologic: Alert and oriented x 3.  Psych: Normal  affect. Extremities: No clubbing or cyanosis.  HEENT: Normal.   Arm feeling better, continue apixaban for RUE DVT (PICC-related).   Continue torsemide 60 mg daily, volume status looks ok on exam.    Discharge today with close followup.   Loralie Champagne 04/23/2022 11:21 AM

## 2022-04-23 NOTE — TOC Initial Note (Signed)
Transition of Care [facility]) - Initial/Assessment Note    Patient Details  Name: Luis [name] MRN: [id number] Date of Birth: [birth year]  Transition of Care [facility]) CM/SW Contact:    Erenest Rasher, RN Phone Number: [phone] 04/23/2022, 10:39 AM  Clinical Narrative:                 HF TOC CM contacted [facility] rep, Malachy Mood with referral. [place] for RW with seat to be covered by HF fund. Meds to come up from [place] at dc. Pt dtr will provide transportation home. Pt states he does not have funds for food. HF CSW with provide with gift card $50 to assist with food. Pt did follow up on food stamp application with DSS CW and application is being processed. States [facility] has contacted him to arrange assessment with RN for his [place]. Pt states he had in the past but it expired due to he was more ambulatory. He feels he will need again due to the fact his condition has declined.   Expected Discharge Plan: [place] Barriers to Discharge: No Barriers Identified   Patient Goals and CMS Choice Patient states their goals for this hospitalization and ongoing recovery are:: wants to remain independent CMS Medicare.gov Compare Post Acute Care list provided to:: Patient Choice offered to / list presented to : Patient  Expected Discharge Plan and Services Expected Discharge Plan: [place]   Discharge Planning Services: CM Consult Post Acute Care Choice: [place] arrangements for the past 2 months: Apartment Expected Discharge Date: 04/23/22               DME Arranged: Gilford Rile rolling with seat DME Agency: [employer] Date DME Agency Contacted: 04/23/22 Time DME Agency Contacted: [phone]       Date HH Agency Contacted: 04/23/22 Time HH Agency Contacted: 87 Representative spoke with at [place]: Sharmon Revere  Prior Living Arrangements/Services Living arrangements for the past 2 months:  [place] with:: Self Patient language and need for interpreter reviewed:: Yes Do you feel safe going back to the place where you live?: Yes      Need for Family Participation in Patient Care: No (Comment) Care giver support system in place?: Yes (comment) Current home services: DME (CPAP, cane, scale) Criminal Activity/Legal Involvement Pertinent to Current Situation/Hospitalization: No - Comment as needed  Activities of Daily Living Home Assistive Devices/Equipment: Walker (specify type) ADL Screening (condition at time of admission) Patient's cognitive ability adequate to safely complete daily activities?: Yes Is the patient deaf or have difficulty hearing?: No Does the patient have difficulty seeing, even when wearing glasses/contacts?: No Does the patient have difficulty concentrating, remembering, or making decisions?: No Patient able to express need for assistance with ADLs?: Yes Does the patient have difficulty dressing or bathing?: No Independently performs ADLs?: Yes (appropriate for developmental age) Does the patient have difficulty walking or climbing stairs?: No Weakness of Legs: None Weakness of Arms/Hands: None  Permission Sought/Granted Permission sought to share information with : Case Manager, Family Supports, PCP Permission granted to share information with : Yes, Verbal Permission Granted  Share Information with NAME: Dorthula Perfect     Permission granted to share info w Relationship: friend  Permission granted to share info w Contact Information: [phone]  Emotional Assessment Appearance:: Appears stated age Attitude/Demeanor/Rapport: Engaged Affect (typically observed): Accepting Orientation: : Oriented to Self, Oriented to Place, Oriented to  Time, Oriented  to Situation   Psych Involvement: No (comment)  Admission diagnosis:  CHF (congestive heart failure) (HCC) [I50.9] Acute on chronic systolic heart failure, NYHA class 3 (HCC)  [I50.23] Patient Active Problem List   Diagnosis Date Noted   CHF (congestive heart failure) ([place]) 04/16/2022   Acute on chronic systolic heart failure, NYHA class 3 ([place]) 04/16/2022   Fall 03/24/2022   Biventricular ICD (implantable cardioverter-defibrillator) in place    Lightheadedness    Acute on chronic combined systolic and diastolic CHF (congestive heart failure) ([place]) 02/26/2022   Cocaine abuse ([place]) 02/26/2022   Chronic respiratory failure with hypoxia (HCC) - 2.5 L/min 02/26/2022   Sleep apnea 08/07/2021   MDD (major depressive disorder), recurrent severe, without psychosis ([place]) 08/06/2021   Moderate episode of recurrent major depressive disorder ([place]) 05/08/2021   Generalized anxiety disorder 05/08/2021   Unspecified mood (affective) disorder ([place]) 05/08/2021   Insomnia 05/08/2021   Cocaine use disorder, moderate, dependence ([place]) 04/02/2021   Right arm weakness 01/28/2021   Right sided weakness 01/28/2021   Non-ischemic cardiomyopathy ([place])    Morbid obesity ([place]) 01/60/1093   Chronic systolic CHF (congestive heart failure) ([place]) 01/17/2019   Pacemaker    HLD (hyperlipidemia)    Hypertension    GERD (gastroesophageal reflux disease)    Tobacco abuse    PCP:  Buzzy Han, MD (Inactive) Pharmacy:   [facility], [address] [place] [address] Phone: [phone] Fax: [facility] [address] [place] [address] Phone: [phone] Fax: [place] [address] [place] [address] Phone: [phone] Fax: [phone]     Social Determinants of Health (SDOH) Interventions    Readmission Risk Interventions    01/31/2021   12:26 PM 01/31/2021    9:25 AM  Readmission Risk Prevention Plan  Transportation Screening Complete   PCP or Specialist Appt within 3-5 Days  Complete Complete  HRI or Home Care Consult Complete Complete  Social Work Consult for [place] Planning/Counseling Patient refused Complete  Palliative Care Screening Not Applicable Not Applicable  Medication Review Press photographer) Complete Referral to Pharmacy

## 2022-04-23 NOTE — Discharge Instructions (Signed)
Information on my medicine - ELIQUIS (apixaban)  This medication education was reviewed with me or my healthcare representative as part of my discharge preparation.  The pharmacist that spoke with me during my hospital stay was:  Einar Grad, [facility]  Why was Eliquis prescribed for you? Eliquis was prescribed to treat blood clots that may have been found in the veins of your legs (deep vein thrombosis) or in your lungs (pulmonary embolism) and to reduce the risk of them occurring again.  What do You need to know about Eliquis ? The starting dose is 10 mg (two 5 mg tablets) taken TWICE daily for the FIRST SEVEN (7) DAYS, then on (enter date)  04/27/22  the dose is reduced to ONE 5 mg tablet taken TWICE daily.  Eliquis may be taken with or without food.   Try to take the dose about the same time in the morning and in the evening. If you have difficulty swallowing the tablet whole please discuss with your pharmacist how to take the medication safely.  Take Eliquis exactly as prescribed and DO NOT stop taking Eliquis without talking to the doctor who prescribed the medication.  Stopping may increase your risk of developing a new blood clot.  Refill your prescription before you run out.  After discharge, you should have regular check-up appointments with your healthcare provider that is prescribing your Eliquis.    What do you do if you miss a dose? If a dose of ELIQUIS is not taken at the scheduled time, take it as soon as possible on the same day and twice-daily administration should be resumed. The dose should not be doubled to make up for a missed dose.  Important Safety Information A possible side effect of Eliquis is bleeding. You should call your healthcare provider right away if you experience any of the following: Bleeding from an injury or your nose that does not stop. Unusual colored urine (red or dark brown) or unusual colored stools (red or black). Unusual bruising for  unknown reasons. A serious fall or if you hit your head (even if there is no bleeding).  Some medicines may interact with Eliquis and might increase your risk of bleeding or clotting while on Eliquis. To help avoid this, consult your healthcare provider or pharmacist prior to using any new prescription or non-prescription medications, including herbals, vitamins, non-steroidal anti-inflammatory drugs (NSAIDs) and supplements.  This website has more information on Eliquis (apixaban): http://www.eliquis.com/eliquis/home

## 2022-04-29 ENCOUNTER — Encounter (HOSPITAL_COMMUNITY): Payer: Self-pay

## 2022-04-29 ENCOUNTER — Ambulatory Visit (HOSPITAL_COMMUNITY)
Admit: 2022-04-29 | Discharge: 2022-04-29 | Disposition: A | Discharge status: Home / Self Care | Payer: Medicare Other | Attending: Family Medicine | Admitting: Family Medicine

## 2022-04-29 VITALS — BP 142/88 | HR 101 | Wt 293.0 lb

## 2022-04-29 DIAGNOSIS — I5022 Chronic systolic (congestive) heart failure: Secondary | ICD-10-CM | POA: Diagnosis not present

## 2022-04-29 DIAGNOSIS — Z8679 Personal history of other diseases of the circulatory system: Secondary | ICD-10-CM

## 2022-04-29 DIAGNOSIS — Z79899 Other long term (current) drug therapy: Secondary | ICD-10-CM | POA: Diagnosis not present

## 2022-04-29 DIAGNOSIS — Z5941 Food insecurity: Secondary | ICD-10-CM | POA: Diagnosis not present

## 2022-04-29 DIAGNOSIS — Z7984 Long term (current) use of oral hypoglycemic drugs: Secondary | ICD-10-CM | POA: Insufficient documentation

## 2022-04-29 DIAGNOSIS — I2510 Atherosclerotic heart disease of native coronary artery without angina pectoris: Secondary | ICD-10-CM | POA: Insufficient documentation

## 2022-04-29 DIAGNOSIS — I110 Hypertensive heart disease with heart failure: Secondary | ICD-10-CM | POA: Insufficient documentation

## 2022-04-29 DIAGNOSIS — Z955 Presence of coronary angioplasty implant and graft: Secondary | ICD-10-CM | POA: Diagnosis not present

## 2022-04-29 DIAGNOSIS — F32A Depression, unspecified: Secondary | ICD-10-CM

## 2022-04-29 DIAGNOSIS — G4733 Obstructive sleep apnea (adult) (pediatric): Secondary | ICD-10-CM | POA: Diagnosis not present

## 2022-04-29 DIAGNOSIS — F1490 Cocaine use, unspecified, uncomplicated: Secondary | ICD-10-CM

## 2022-04-29 DIAGNOSIS — Z794 Long term (current) use of insulin: Secondary | ICD-10-CM | POA: Diagnosis not present

## 2022-04-29 DIAGNOSIS — E785 Hyperlipidemia, unspecified: Secondary | ICD-10-CM | POA: Insufficient documentation

## 2022-04-29 DIAGNOSIS — I428 Other cardiomyopathies: Secondary | ICD-10-CM | POA: Diagnosis not present

## 2022-04-29 DIAGNOSIS — Z139 Encounter for screening, unspecified: Secondary | ICD-10-CM

## 2022-04-29 DIAGNOSIS — I10 Essential (primary) hypertension: Secondary | ICD-10-CM | POA: Diagnosis not present

## 2022-04-29 DIAGNOSIS — E662 Morbid (severe) obesity with alveolar hypoventilation: Secondary | ICD-10-CM

## 2022-04-29 DIAGNOSIS — E119 Type 2 diabetes mellitus without complications: Secondary | ICD-10-CM | POA: Diagnosis not present

## 2022-04-29 DIAGNOSIS — Z86718 Personal history of other venous thrombosis and embolism: Secondary | ICD-10-CM | POA: Insufficient documentation

## 2022-04-29 DIAGNOSIS — Z95810 Presence of automatic (implantable) cardiac defibrillator: Secondary | ICD-10-CM | POA: Diagnosis not present

## 2022-04-29 DIAGNOSIS — Z7901 Long term (current) use of anticoagulants: Secondary | ICD-10-CM | POA: Insufficient documentation

## 2022-04-29 DIAGNOSIS — I82721 Chronic embolism and thrombosis of deep veins of right upper extremity: Secondary | ICD-10-CM

## 2022-04-29 LAB — BASIC METABOLIC PANEL
Anion gap: 16 — ABNORMAL HIGH (ref 5–15)
BUN: 12 mg/dL (ref 6–20)
CO2: 21 mmol/L — ABNORMAL LOW (ref 22–32)
Calcium: 9.5 mg/dL (ref 8.9–10.3)
Chloride: 102 mmol/L (ref 98–111)
Creatinine, Ser: 1.11 mg/dL (ref 0.61–1.24)
GFR, Estimated: >60 mL/min (ref >60)
Glucose, Bld: 284 mg/dL — ABNORMAL HIGH (ref 70–99)
Potassium: 3.9 mmol/L (ref 3.5–5.1)
Sodium: 139 mmol/L (ref 135–145)

## 2022-04-29 LAB — CBC
HCT: 47.8 % (ref 39.0–52.0)
Hemoglobin: 15.4 g/dL (ref 13.0–17.0)
MCH: 28.7 pg (ref 26.0–34.0)
MCHC: 32.2 g/dL (ref 30.0–36.0)
MCV: 89 fL (ref 80.0–100.0)
Platelets: 298 10*3/uL (ref 150–400)
RBC: 5.37 MIL/uL (ref 4.22–5.81)
RDW: 13.5 % (ref 11.5–15.5)
WBC: 11.5 10*3/uL — ABNORMAL HIGH (ref 4.0–10.5)
nRBC: 0 % (ref 0.0–0.2)

## 2022-04-29 LAB — DIGOXIN LEVEL: Digoxin Level: <0.2 ng/mL — ABNORMAL LOW (ref 0.8–2.0)

## 2022-04-29 MED ORDER — ENTRESTO 97-103 MG PO TABS: 1.0000 | ORAL_TABLET | Freq: Two times a day (BID) | ORAL | 7 refills | Status: DC

## 2022-04-29 NOTE — Patient Instructions (Signed)
Thank you for coming in today  Labs were done today, if any labs are abnormal the clinic will call you No news is good news  INCREASE ENTRESTO to 97/103 1 tablet twice daily   Your physician recommends that you schedule a follow-up appointment in:  3 weeks Pharmacy Dr. Aundra Dubin with echocardiogram 3-4 months     Do the following things EVERYDAY: Weigh yourself in the morning before breakfast. Write it down and keep it in a log. Take your medicines as prescribed Eat low salt foods--Limit salt (sodium) to 2000 mg per day.  Stay as active as you can everyday Limit all fluids for the day to less than 2 liters  At the [facility], you and your health needs are our priority. As part of our continuing mission to provide you with exceptional heart care, we have created designated Provider Care Teams. These Care Teams include your primary Cardiologist (physician) and Advanced Practice Providers (APPs- Physician Assistants and Nurse Practitioners) who all work together to provide you with the care you need, when you need it.   You may see any of the following providers on your designated Care Team at your next follow up: Dr Glori Bickers Dr Loralie Champagne Dr. Roxana Hires, NP Lyda Jester, [place] [facility] [place], [place] Forestine Na, NP Audry Riles, PharmD   Please be sure to bring in all your medications bottles to every appointment.   If you have any questions or concerns before your next appointment please send [place] a message through [place] or call our office at [phone].    TO LEAVE A MESSAGE FOR THE NURSE SELECT OPTION 2, PLEASE LEAVE A MESSAGE INCLUDING: YOUR NAME DATE OF BIRTH CALL BACK NUMBER REASON FOR CALL**this is important as we prioritize the call backs  YOU WILL RECEIVE A CALL BACK THE SAME DAY AS LONG AS YOU CALL BEFORE 4:00 PM

## 2022-04-29 NOTE — Progress Notes (Signed)
PCP: Buzzy Han, MD (Inactive) Primary Cardiologist: Dr. Ellyn Hack (pre[facility]  HF Cardiology: Dr. Aundra Dubin  51 y.o. male with h/o chronic systolic heart failure, VT s/p ICD (MDT CRT-D), HTN, HLD, Type 2 DM, OSA on CPAP and h/o substance use (cocaine). He has a reported h/o CAD/PCI in [place] though his cardiomyopathy has been classified as NICM. Unfortunately, I am unable to locate LHC/ angiographic details in Care Everywhere. Echos from 2021-2023 have shown LVEF ranging from 25-35%, RV mildly reduced.  Cardiomyopathy felt likely caused by cocaine use.   Admitted 9/23 for acute on chronic systolic CHF.  ICD was also noted to be at [facility]. Echo showed EF 25% w/ global HK, RV mildly reduced. He was diuresed w/ IV Lasix and underwent RHC revealing normal filling pressures and reduced CO w/ CI of 1.82 L/min/m. He was placed on GDMT and transitioned to PO torsemide. Underwent gen change on 03/23/22 prior to d/c. D/c wt 296 lb. Referred to [facility] clinic.  Seen in [facility] on 04/14/22 w/ NYHA Class IIIb symptoms and fluid overload. Diuretics increased set up for RHC.    R/LHC (10/23) showed elevated filling pressures and low output. mRA 14, PAP 45/22, mPCWP 27, LVEDP 32, PA sat 60%, CI 1.77. LHC showed no significant CAD. He was directly admitted from cath lab for IV diuresis and initiation of inotropic support w/ milrinone. Milrinone weaned and GDMT titrated. Found to have RUE DVT, PICC removed and placed on Eliquis. Felt not to be a LVAD/transplant candidate due to OHS/OSA, poor mobility and social situation. Discharged home, weight 283 lbs.  Today he returns for post hospital HF follow up. Overall feeling fine. He can walk further distances on flat ground with his rolling walker without significant dyspnea if he takes his time. He wears 2L oxygen. He walked from a clinic across to the street today to AHF clinic, had to stop once to rest. Denies palpitations, abnormal  bleeding, CP, dizziness, edema, or PND/Orthopnea. Appetite ok. No fever or chills. Weight at home 289 pounds. Taking all medications. He is a Sales promotion account executive witness. No ETOH/tobacco/drug use.  ECG (personally reviewed): NSR, BiV paced.  Device interrogation (personally reviewed): OptiVol creeping up, thoracic impedence stable, no VT/AF, 0.7 hr/day activity.  Labs (10/23): LDL 61, K 4.5, creatinine 1.09 Labs (10/23): K 3.9, creatinine 1.27, hgb 14.6  PMH: 1. Depression 2. VT: MDT CRT-D.  - s/p gen change 03/23/22 3. HTN 4. Hyperlipidemia 5. Type 2 diabetes 6. OSA: Uses CPAP.  7. Chronic systolic CHF: ?Nonischemic cardiomyopathy.  Has MDT CRT-D device.  Reportedly, CMP was diagnosed in 2012 in [place].  Etiology thought to be cocaine (primarily nonischemic).   - Echo (9/23): EF 25%, mild LV dilation, mildly decreased RV function.  - RHC (9/23): mean RA 8, mean PCWP 13, CI 1.82 - R/LHC (10/23): no significant CAD, RA mean 14, PA 45/22 (mean 31), PCWP (mean 27), CO/CI (Fick) 4.3/1.77, PVR 1 WU, PAPi 1.6 8. ? CAD: No cath reports available in Care Everywhere.  ?History of prior PCI per records.  - LHC (10/23): no significant CAD.  Review of Systems: All systems reviewed and negative except as per HPI.   Current Outpatient Medications  Medication Sig Dispense Refill   apixaban (ELIQUIS) 5 MG TABS tablet Take 2 tablets (10 mg) twice a day through 10/29, starting 10/30 take 1 tablet (5 mg) twice a day 65 tablet 5   carvedilol (COREG) 3.125 MG tablet Take 1 tablet (3.125 mg total)  by mouth 2 (two) times daily with a meal. 60 tablet 5   dapagliflozin propanediol (FARXIGA) 10 MG TABS tablet Take 1 tablet (10 mg total) by mouth daily. 30 tablet 5   digoxin (LANOXIN) 0.125 MG tablet Take 1 tablet (0.125 mg total) by mouth daily. 30 tablet 5   DULoxetine (CYMBALTA) 30 MG capsule Take 3 capsules (90 mg total) by mouth daily. 90 capsule 1   hydrOXYzine (ATARAX) 25 MG tablet Take 1 tablet (25 mg total)  by mouth 3 (three) times daily as needed for anxiety. 75 tablet 1   Insulin Lispro Prot & Lispro (HUMALOG MIX 75/25 KWIKPEN) (75-25) 100 UNIT/ML Kwikpen Inject 70 Units into the skin 3 (three) times daily.     prazosin (MINIPRESS) 2 MG capsule Take 1 capsule (2 mg total) by mouth at bedtime. 30 capsule 1   rosuvastatin (CRESTOR) 20 MG tablet Take 1 tablet (20 mg total) by mouth daily. 30 tablet 1   sacubitril-valsartan (ENTRESTO) 49-51 MG Take 1 tablet by mouth 2 (two) times daily. 60 tablet 5   spironolactone (ALDACTONE) 25 MG tablet Take 1 tablet (25 mg total) by mouth daily. 30 tablet 5   torsemide (DEMADEX) 20 MG tablet Take 3 tablets (60 mg total) by mouth daily. 90 tablet 5   traZODone (DESYREL) 100 MG tablet Take 2 tablets (200 mg total) by mouth at bedtime. 28 tablet 0   TRULICITY 1.5 CX/4.4YJ SOPN Inject 1.5 mg into the skin once a week. 0.5 mL 4   cyclobenzaprine (FLEXERIL) 10 MG tablet Take 10 mg by mouth daily. (Patient not taking: Reported on 04/29/2022)     No current facility-administered medications for this encounter.   Allergies  Allergen Reactions   Aspirin Anaphylaxis and Other (See Comments)    Throat closing    Iodine-131 Hives   Peanut (Diagnostic) Anaphylaxis    Throat closes   Iodinated Contrast Media Hives    Can be premedicated    Latex Hives and Rash   Penicillins Nausea And Vomiting   Ultram [Tramadol] Hives and Nausea Only   Zestril [Lisinopril] Cough   Augmentin [Amoxicillin-Pot Clavulanate] Diarrhea   Lidocaine Rash   Social History   Socioeconomic History   Marital status: Widowed    Spouse name: Not on file   Number of children: 7   Years of education: Not on file   Highest education level: Bachelor's degree (e.g., BA, AB, BS)  Occupational History   Occupation: disability  Tobacco Use   Smoking status: Former    Packs/day: 0.20    Types: Cigarettes    Quit date: 10/2021    Years since quitting: 0.5   Smokeless tobacco: Never   Tobacco  comments:    occasionally  Vaping Use   Vaping Use: Never used  Substance and Sexual Activity   Alcohol use: Not Currently   Drug use: Yes    Types: Cocaine    Comment: last used july   Sexual activity: Not Currently  Other Topics Concern   Not on file  Social History Narrative   Not on file   Social Determinants of Health   Financial Resource Strain: Low Risk  (03/17/2022)   Overall Financial Resource Strain (CARDIA)    Difficulty of Paying Living Expenses: Not hard at all  Food Insecurity: Food Insecurity Present (04/23/2022)   Hunger Vital Sign    Worried About Running Out of Food in the Last Year: Often true    Ran Out of Food in the Last  Year: Often true  Transportation Needs: No Transportation Needs (04/18/2022)   PRAPARE - Hydrologist (Medical): No    Lack of Transportation (Non-Medical): No  Physical Activity: Not on file  Stress: Not on file  Social Connections: Not on file  Intimate Partner Violence: Not At Risk (04/18/2022)   Humiliation, Afraid, Rape, and Kick questionnaire    Fear of Current or Ex-Partner: No    Emotionally Abused: No    Physically Abused: No    Sexually Abused: No   Family History  Problem Relation Age of Onset   Hypertension Mother    Bone cancer Father    Lung cancer Father    BP (!) 142/88   Pulse (!) 101   Wt 132.9 kg (293 lb)   SpO2 96% Comment: on 2L  BMI 47.29 kg/m   Wt Readings from Last 3 Encounters:  04/29/22 132.9 kg (293 lb)  04/23/22 128.7 kg (283 lb 11.2 oz)  04/14/22 (!) 145.2 kg (320 lb)   PHYSICAL EXAM: General:  NAD. No resp difficulty, walked into clinic with rolling walker on oxygen HEENT: Normal Neck: Supple. Thick neck, JVD difficult. Carotids 2+ bilat; no bruits. No lymphadenopathy or thryomegaly appreciated. Cor: PMI nondisplaced. Regular rate & rhythm. No rubs, gallops or murmurs. Lungs: Clear, diminished in bases. Abdomen: Obese, soft, nontender, nondistended. No  hepatosplenomegaly. No bruits or masses. Good bowel sounds. Extremities: No cyanosis, clubbing, rash, edema Neuro: Alert & oriented x 3, cranial nerves grossly intact. Moves all 4 extremities w/o difficulty. Affect pleasant.  ASSESSMENT & PLAN: 1. Chronic Systolic CHF:  Nonischemic cardiomyopathy.  MDT CRT-D device.  Echoes 2021-2023, LVEF range 25-35%, RV mildly reduced.  Reportedly diagnosed with nonischemic cardiomyopathy in 2012 in [place], etiology felt due to cocaine abuse.  [facility] 03/07/2019 notes mention patient has CAD with history of angioplasty in the past. Unable to locate cath reports/ details regarding coronary anatomy in record. Most recent echo in 2/23 showed EF 25%, mild LV dilation, mildly decreased RV function. RHC in 9/23 showed low CI at 1.82. RHC (10/23) showed elevated filling pressures and low output, mRA 14, PAP 45/22, mPCWP 27, LVEDP 32, PA sat 60%, CI 1.77. LHC showed no significant coronary disease. Today, he has NYHA II-early III symptoms, functional class confounded by body habitus and OHS/OSA. He does not appear markedly volume overloaded on exam, but his weight is up from discharge and device interrogation shows OptiVol slowly trending up. - Increase Entresto to 97/103 mg bid. BMET/BNP today, repeat BMET in 10 days. - Continue torsemide 60 mg daily - Continue spiro 25 mg daily - Continue Farxiga 10 mg daily.  No GU symptoms. - Continue Coreg 3.125 mg bid. - Continue digoxin 0.125 mg daily.  Check digoxin level today. - Suspect that with OHS/OSA, obesity, and his poor mobility that he will be a poor candidate for LVAD/transplant.  - Plan to repeat echo when GDMT optimized to look for EF recovery, imperative he avoids cocaine. 2. CAD: Documented in Care Everywhere, reported h/o angioplasty, details unknown (cannot find a cath report though prior CAD is referenced). LHC showed no significant CAD. No chest pain. - Plavix stopped since Eliquis initiated.    -  Continue Crestor 20 mg daily.  3. H/o VT: Has CRT-D w/ chronic systolic heart failure and QRS > 150 ms  - Followed by [facility] device clinic  4. HTN: Mildly elevated.  - Increase Entresto as above. 5. Type 2DM: Hgb A1C >9.  -  He is on insulin. - Continue SGLT2i, watch for GU and fungal infections. 6. Cocaine: Says he has quit for months now.    7. Depression: Depressed due to lack of social/family support and food insecurity  - Continue Cymbalta.  8. Jehovah's Witness.  9. OHS/OSA: He uses oxygen during the day and CPAP at night.  This likely plays a role in his dyspnea.  10. H/o AKI: Last SCr 1.27. BMET today. 11. RUE DVT: Provoked. PICC line pulled. Had refused some doses of Lovenox. - Continue Eliquis5 mg bid. Need to continue at least 3 months. CBC today. 12. SDOH: Engage HFSW to help with food stamps and social security.   Follow up in 3 weeks with PharmD (check BMET, increase beta blocker) and 3-4 months with Dr. Aundra Dubin + echo.  Allena Katz, FNP-BC 04/29/22

## 2022-04-29 NOTE — Addendum Note (Signed)
Encounter addended by: [name], LCSW on: 04/29/2022 4:30 PM  Actions taken: Flowsheet accepted, Clinical Note Signed

## 2022-04-29 NOTE — Progress Notes (Signed)
Heart and Vascular Care Navigation  04/29/2022  Luis [name] 1971/05/07 [id number]  Reason for Referral: food stamp and disability concerns    Engaged with patient face to face for initial visit for Heart and Vascular Care Coordination.                                                                                                   Assessment:    CSW spoke with pt regarding above concerns.  States that he spoke with case worker and cause his SNAP recertification was turned in late wont be determined upon until 11/9- currently struggling to get food in the house.  Pt also had phone turned off which would be $35 that he can't afford right now cause his SSI check was stopped because someone tried to break into his account so it was frozen.  Pt also can't pay his $261 in rent which is due no later than 12/5.  CSW attempted to call management company to discuss the delay in payment and see if they could accommodate but had to leave a message.  Suggested he go in person to speak with someone about the situation and see if he could pay on the 15th when they said it would be resolved by.  CSW will help pt follow up on this concern.  Pt continues to express mental health concerns but refuses to get assistance until SDOH concerns are resolved.  Endorses coping mechanisms of prayer and remembering people who care about him.                                  HRT/VAS Care Coordination     Patients Home Cardiology Office Heart Failure Clinic   Outpatient Care Team Social Worker   Social Worker Name: [name], [facility], [phone]   Living arrangements for the past 2 months Apartment   Lives with: Self   Patient Current Insurance Coverage Managed Medicare; Medicaid   Patient Has Concern With Paying Medical Bills No   Does Patient Have Prescription Coverage? Yes   Home Assistive Devices/Equipment Walker (specify type)   DME Agency [employer]   [place]   Current home services DME  CPAP, cane, scale       Social History:                                                                             [place]: Food Insecurity Present (04/29/2022)  Housing: Low Risk  (04/18/2022)  Transportation Needs: No Transportation Needs (04/18/2022)  Utilities: Not At Risk (04/18/2022)  Alcohol Screen: Low Risk  (03/17/2022)  Depression (PHQ2-9): Medium Risk (03/30/2022)  Financial Resource Strain: Low Risk  (03/17/2022)  Tobacco Use: Medium Risk (04/29/2022)    SDOH Interventions: Financial Resources:    Working with [employer] on restarting check  Food Insecurity:  Food Insecurity Interventions: Other (Comment) (gift cards)  Housing Insecurity:   Worried about paying rent  Transportation:     Not reported   Follow-up plan:    CSW to help pt work with management company to avoid penalty with late rent.  Will continue to follow and assist as needed  Burna Sis, LCSW Clinical Social Worker Advanced [facility] Failure Clinic Desk#: [phone] Cell#: [phone]

## 2022-05-04 ENCOUNTER — Telehealth (HOSPITAL_COMMUNITY): Payer: Self-pay | Admitting: Licensed Clinical Social Worker

## 2022-05-04 NOTE — Telephone Encounter (Signed)
H&V Care Navigation CSW Progress Note  Clinical Social Worker submitted check request to help with pt rent payment as patient account currently frozen.  Will continue to follow and assist as needed.   SDOH Screenings   Food Insecurity: Food Insecurity Present (04/29/2022)  Housing: Medium Risk (05/04/2022)  Transportation Needs: No Transportation Needs (04/18/2022)  Utilities: Not At Risk (04/18/2022)  Alcohol Screen: Low Risk  (03/17/2022)  Depression (PHQ2-9): Medium Risk (03/30/2022)  Financial Resource Strain: Low Risk  (03/17/2022)  Tobacco Use: Medium Risk (04/29/2022)   [name], LCSW Clinical Social Worker [facility] Desk#: [phone] Cell#: [phone]

## 2022-05-24 NOTE — Progress Notes (Incomplete)
***In Progress***    Advanced Heart Failure Clinic Note   HPI:  51 y.o. male with h/o chronic systolic heart failure, VT s/p ICD (MDT CRT-D), HTN, HLD, Type 2 DM, OSA on CPAP and h/o substance use (cocaine). He has a reported h/o CAD/PCI in [place] though his cardiomyopathy has been classified as NICM. Unfortunately, I am unable to locate LHC/ angiographic details in Care Everywhere. Echos from 2021-2023 have shown LVEF ranging from 25-35%, RV mildly reduced.  Cardiomyopathy felt likely caused by cocaine use.    Admitted 9/23 for acute on chronic systolic CHF.  ICD was also noted to be at [facility]. Echo showed EF 25% w/ global HK, RV mildly reduced. He was diuresed w/ IV Lasix and underwent RHC revealing normal filling pressures and reduced CO w/ CI of 1.82 L/min/m. He was placed on GDMT and transitioned to PO torsemide. Underwent gen change on 03/23/22 prior to d/c. D/c wt 296 lb. Referred to [facility] clinic.   Seen in [facility] on 04/14/22 w/ NYHA Class IIIb symptoms and fluid overload. Diuretics increased set up for RHC.    R/LHC (10/23) showed elevated filling pressures and low output. mRA 14, PAP 45/22, mPCWP 27, LVEDP 32, PA sat 60%, CI 1.77. LHC showed no significant CAD. He was directly admitted from cath lab for IV diuresis and initiation of inotropic support w/ milrinone. Milrinone weaned and GDMT titrated. Found to have RUE DVT, PICC removed and placed on Eliquis. Felt not to be a LVAD/transplant candidate due to OHS/OSA, poor mobility and social situation. Discharged home, weight 283 lbs.   Today he returns for post hospital HF follow up. Overall feeling fine. He can walk further distances on flat ground with his rolling walker without significant dyspnea if he takes his time. He wears 2L oxygen. He walked from a clinic across to the street today to AHF clinic, had to stop once to rest. Denies palpitations, abnormal bleeding, CP, dizziness, edema, or PND/Orthopnea. Appetite ok. No fever or chills.  Weight at home 289 pounds. Taking all medications. He is a Sales promotion account executive witness. No ETOH/tobacco/drug use.  Today he returns to HF clinic for pharmacist medication titration. At last visit with APP, Entresto was increased to 97/103 mg BID.   Overall feeling ***. Dizziness, lightheadedness, fatigue:  Chest pain or palpitations:  How is your breathing?: *** SOB: Able to complete all ADLs. Activity level ***  Weight at home pounds. Takes furosemide/torsemide/bumex *** mg *** daily.  LEE PND/Orthopnea  Appetite *** Low-salt diet:   Physical Exam Cost/affordability of meds   HF Medications: Carvedilol 3.125 mg BID Entresto 97/103 mg BID Spironolactone 25 mg daily  Dapagliflozin 10 mg daily  Torsemide 60 mg daily  Digoxin 0.125 mg daily   Understanding of regimen: {excellent/good/fair/poor:19665} Understanding of indications: {excellent/good/fair/poor:19665} Potential of compliance: {excellent/good/fair/poor:19665} Patient understands to avoid NSAIDs. Patient understands to avoid decongestants.    Pertinent Lab Values (from 04/29/2022): Serum creatinine 1.11, BUN 12, Potassium 3.9, Sodium 139, BNP 336.9 (from 04/16/2022), Digoxin level < 0.2   Vital Signs: Weight: *** (last clinic weight: ***) Blood pressure: ***  Heart rate: ***   Assessment/Plan: 1. Chronic Systolic CHF:  Nonischemic cardiomyopathy.  MDT CRT-D device.  Echoes 2021-2023, LVEF range 25-35%, RV mildly reduced.  Reportedly diagnosed with nonischemic cardiomyopathy in 2012 in [place], etiology felt due to cocaine abuse.  [place] [facility]ology 03/07/2019 notes mention patient has CAD with history of angioplasty in the past. Unable to locate cath reports/ details regarding coronary anatomy in  record. Most recent echo in 2/23 showed EF 25%, mild LV dilation, mildly decreased RV function. RHC in 9/23 showed low CI at 1.82. RHC (10/23) showed elevated filling pressures and low output, mRA 14, PAP 45/22, mPCWP 27, LVEDP 32,  PA sat 60%, CI 1.77. LHC showed no significant coronary disease. Today, he has NYHA II-early III symptoms, functional class confounded by body habitus and OHS/OSA. He does not appear markedly volume overloaded on exam, but his weight is up from discharge and device interrogation shows OptiVol slowly trending up. - Increase Entresto to 97/103 mg bid. BMET/BNP today, repeat BMET in 10 days. - Continue torsemide 60 mg daily - Continue spironolactone 25 mg daily - Continue dapagliflozin 10 mg daily.  No GU symptoms. - Continue carvedilol 3.125 mg bid. - Continue digoxin 0.125 mg daily.  Check digoxin level today. - Suspect that with OHS/OSA, obesity, and his poor mobility that he will be a poor candidate for LVAD/transplant.  - Plan to repeat echo when GDMT optimized to look for EF recovery, imperative he avoids cocaine. 2. CAD: Documented in Care Everywhere, reported h/o angioplasty, details unknown (cannot find a cath report though prior CAD is referenced). LHC showed no significant CAD. No chest pain. - Plavix stopped since Eliquis initiated.    - Continue Crestor 20 mg daily.  3. H/o VT: Has CRT-D w/ chronic systolic heart failure and QRS > 150 ms  - Followed by [facility] device clinic  4. HTN: Mildly elevated.  - Increase Entresto as above. 5. Type 2DM: Hgb A1C >9.  - He is on insulin. - Continue SGLT2i, watch for GU and fungal infections. 6. Cocaine: Says he has quit for months now.    7. Depression: Depressed due to lack of social/family support and food insecurity  - Continue Cymbalta.  8. Jehovah's Witness.  9. OHS/OSA: He uses oxygen during the day and CPAP at night.  This likely plays a role in his dyspnea.  10. H/o AKI: Last SCr 1.27. BMET today. 11. RUE DVT: Provoked. PICC line pulled. Had refused some doses of Lovenox. - Continue Eliquis5 mg bid. Need to continue at least 3 months. CBC today.  12. SDOH: Engage HFSW to help with food stamps and social security.  Follow up 06/01/2022  with Dr. Jonn Shingles, PharmD, BCPS, [facility], CPP Heart Failure Clinic Pharmacist [phone]

## 2022-05-25 ENCOUNTER — Ambulatory Visit (HOSPITAL_COMMUNITY)
Admission: RE | Admit: 2022-05-25 | Discharge: 2022-05-25 | Disposition: A | Discharge status: Home / Self Care | Payer: Medicare Other | Source: Ambulatory Visit | Attending: Cardiology | Admitting: Cardiology

## 2022-05-25 ENCOUNTER — Other Ambulatory Visit (HOSPITAL_COMMUNITY): Payer: Self-pay

## 2022-05-25 VITALS — BP 142/80 | HR 92 | Wt 298.2 lb

## 2022-05-25 DIAGNOSIS — Z95810 Presence of automatic (implantable) cardiac defibrillator: Secondary | ICD-10-CM | POA: Insufficient documentation

## 2022-05-25 DIAGNOSIS — G4733 Obstructive sleep apnea (adult) (pediatric): Secondary | ICD-10-CM | POA: Diagnosis not present

## 2022-05-25 DIAGNOSIS — I110 Hypertensive heart disease with heart failure: Secondary | ICD-10-CM | POA: Diagnosis not present

## 2022-05-25 DIAGNOSIS — E785 Hyperlipidemia, unspecified: Secondary | ICD-10-CM | POA: Insufficient documentation

## 2022-05-25 DIAGNOSIS — N179 Acute kidney failure, unspecified: Secondary | ICD-10-CM | POA: Diagnosis not present

## 2022-05-25 DIAGNOSIS — I472 Ventricular tachycardia, unspecified: Secondary | ICD-10-CM | POA: Diagnosis not present

## 2022-05-25 DIAGNOSIS — F32A Depression, unspecified: Secondary | ICD-10-CM | POA: Diagnosis not present

## 2022-05-25 DIAGNOSIS — I428 Other cardiomyopathies: Secondary | ICD-10-CM | POA: Insufficient documentation

## 2022-05-25 DIAGNOSIS — I2510 Atherosclerotic heart disease of native coronary artery without angina pectoris: Secondary | ICD-10-CM | POA: Insufficient documentation

## 2022-05-25 DIAGNOSIS — E119 Type 2 diabetes mellitus without complications: Secondary | ICD-10-CM | POA: Diagnosis not present

## 2022-05-25 DIAGNOSIS — F1490 Cocaine use, unspecified, uncomplicated: Secondary | ICD-10-CM | POA: Insufficient documentation

## 2022-05-25 DIAGNOSIS — I5022 Chronic systolic (congestive) heart failure: Secondary | ICD-10-CM | POA: Diagnosis not present

## 2022-05-25 LAB — BASIC METABOLIC PANEL
Anion gap: 11 (ref 5–15)
BUN: 10 mg/dL (ref 6–20)
CO2: 22 mmol/L (ref 22–32)
Calcium: 9.1 mg/dL (ref 8.9–10.3)
Chloride: 103 mmol/L (ref 98–111)
Creatinine, Ser: 0.69 mg/dL (ref 0.61–1.24)
GFR, Estimated: >60 mL/min (ref >60)
Glucose, Bld: 209 mg/dL — ABNORMAL HIGH (ref 70–99)
Potassium: 4.1 mmol/L (ref 3.5–5.1)
Sodium: 136 mmol/L (ref 135–145)

## 2022-05-25 LAB — BRAIN NATRIURETIC PEPTIDE: B Natriuretic Peptide: 228.5 pg/mL — ABNORMAL HIGH (ref 0.0–100.0)

## 2022-05-25 MED ORDER — APIXABAN 5 MG PO TABS: 5.0000 mg | ORAL_TABLET | Freq: Two times a day (BID) | ORAL | 2 refills | Status: DC

## 2022-05-25 MED ORDER — TORSEMIDE 20 MG PO TABS: 60.0000 mg | ORAL_TABLET | Freq: Every day | ORAL | 2 refills | Status: DC

## 2022-05-25 MED ORDER — SPIRONOLACTONE 25 MG PO TABS: 25.0000 mg | ORAL_TABLET | Freq: Every day | ORAL | 2 refills | Status: DC

## 2022-05-25 MED ORDER — DAPAGLIFLOZIN PROPANEDIOL 10 MG PO TABS: 10.0000 mg | ORAL_TABLET | Freq: Every day | ORAL | 2 refills | Status: DC

## 2022-05-25 MED ORDER — ROSUVASTATIN CALCIUM 20 MG PO TABS: 20.0000 mg | ORAL_TABLET | Freq: Every day | ORAL | 2 refills | Status: DC

## 2022-05-25 MED ORDER — DIGOXIN 125 MCG PO TABS: 0.1250 mg | ORAL_TABLET | Freq: Every day | ORAL | 2 refills | Status: DC

## 2022-05-25 MED ORDER — CARVEDILOL 3.125 MG PO TABS: 3.1250 mg | ORAL_TABLET | Freq: Two times a day (BID) | ORAL | 2 refills | Status: DC

## 2022-05-25 NOTE — Progress Notes (Signed)
Advanced Heart Failure Clinic Note   PCP: Margot Ables, MD (Inactive) Primary Cardiologist: Dr. Herbie Baltimore (previously followed at [facility])  HF Cardiology: Dr. Shirlee Latch  HPI:  51 y.o. male with h/o chronic systolic heart failure, VT s/p ICD (MDT CRT-D), HTN, HLD, Type 2 DM, OSA on CPAP and h/o substance use (cocaine). He has a reported h/o CAD/PCI in Care Everywhere though his cardiomyopathy has been classified as NICM. Unfortunately, LHC/ angiographic details don't seem to be available in Care Everywhere. Echos from 2021-2023 have shown LVEF ranging from 25-35%, RV mildly reduced. Cardiomyopathy felt likely caused by cocaine use.    Admitted 02/2022 for acute on chronic systolic CHF. ICD was also noted to be at [facility]. Echo showed EF 25% w/ global HK, RV mildly reduced. He was diuresed w/ IV furosemide and underwent RHC revealing normal filling pressures and reduced CO w/ CI of 1.82 L/min/m. He was placed on GDMT and transitioned to PO torsemide. Underwent generator exchange on 03/23/2022 prior to d/c. D/c wt 296 lb. Referred to [facility] clinic.   Seen in [facility] on 04/14/2022 w/ NYHA Class IIIb symptoms and fluid overload. Diuretics increased and set up for RHC.    R/LHC (03/2022) showed elevated filling pressures and low output. mRA 14, PAP 45/22, mPCWP 27, LVEDP 32, PA sat 60%, CI 1.77. LHC showed no significant CAD. He was directly admitted from cath lab for IV diuresis and initiation of inotropic support w/ milrinone. Milrinone weaned and GDMT titrated. Found to have RUE DVT, PICC removed and placed on apixaban. Felt not to be a LVAD/transplant candidate due to OHS/OSA, poor mobility and social situation. Discharged home, weight 283 lbs.   Presented to AHF clinic on 04/29/2022 for APP visit. Overall, he reported feeling fine. He endorsed ability to walk further distances on flat ground with his rolling walker without significant dyspnea if he takes his time. He wears 2L oxygen. He walked  from a clinic across to the street to AHF clinic, had to stop once to rest. Denied palpitations, abnormal bleeding, CP, dizziness, edema, or PND/Orthopnea. Appetite ok. No fever or chills. Weight at home was 289 pounds. Reported taking all medications. He is a TEFL teacher witness. No ETOH/tobacco/drug use.  Today he returns to HF clinic for pharmacist medication titration. At last visit with APP, Entresto was increased to 97/103 mg BID. Overall, he reports feeling "ok"  with some good days and some bad days. He endorses mild bilateral lower back pain and frequent urination which he implies is bothersome. He reports frequent episodes of dizziness and lightheadedness that occur randomly and have been affecting him since his hospitalization in 02/2022. He says he is chronically fatigued given struggles with staying asleep. He notes wound pain at the site of his ICD implantation. He also reports mild chest pain and palpitations that occur randomly and resolve on their own. He says his breathing has been "fine" on 2L oxygen and endorses increased exercise capacity. Namely, he is able to walk (with his rolling walker) to the bathroom without SOB and to the store around the corner of his home with ~2 stops for SOB. He wants to start water aerobics in the new year. He weighs himself at home every day and is currently ~285 lbs. He takes torsemide 60 mg daily. Trace bilateral LE edema on exam. Endorses mild abdominal bloating. Patient spoke to HFSW at conclusion of visit to discuss SDOH. Patient states he is going out of town this Thursday to [place] and  wont be back until early January.   HF Medications: Carvedilol 3.125 mg BID Entresto 97/103 mg BID Spironolactone 25 mg daily  Dapagliflozin 10 mg daily  Digoxin 0.125 mg daily  Torsemide 60 mg daily   Understanding of regimen: good Understanding of indications: good Potential of compliance: good Patient understands to avoid NSAIDs. Patient understands to avoid  decongestants.  Pertinent Lab Values (from 04/29/2022): Serum creatinine 1.11, BUN 12, Potassium 3.9, Sodium 139, BNP 336.9 (from 04/16/2022), Digoxin level < 0.2  BMET today pending  Vital Signs: Weight: 298.2 lbs (last clinic weight: 293 lbs) Blood pressure: 142/80 mmHg  Heart rate: 92 bpm   Assessment/Plan: 1. Chronic Systolic CHF:  Nonischemic cardiomyopathy.  MDT CRT-D device.  Echoes 2021-2023, LVEF range 25-35%, RV mildly reduced.  Reportedly diagnosed with nonischemic cardiomyopathy in 2012 in [place], etiology felt due to cocaine abuse.  [place] cardiology 03/07/2019 notes mention patient has CAD with history of angioplasty in the past. Unable to locate cath reports/ details regarding coronary anatomy in record. Most recent echo in 07/2021 showed EF 25%, mild LV dilation, mildly decreased RV function. RHC in 02/2022 showed low CI at 1.82. RHC (03/2022) showed elevated filling pressures and low output, mRA 14, PAP 45/22, mPCWP 27, LVEDP 32, PA sat 60%, CI 1.77. LHC showed no significant coronary disease.  - NYHA II-early III symptoms, functional class confounded by body habitus and OHS/OSA. He does not appear markedly volume overloaded on exam, but his weight is up from last visit. - Will make no changes today as patient is leaving and will be out of town for >1 month. Will not increase carvedilol as patient's weight is up and he notes trace swelling in his ankles that is increased from his normal baseline.  - BMET today pending - Continue torsemide 60 mg daily - Continue carvedilol 3.125 mg bid. - Continue Entresto 97/103 mg BID.  - Continue spironolactone 25 mg daily - Continue dapagliflozin 10 mg daily.  No GU symptoms. - Continue digoxin 0.125 mg daily.   - Consider Bidil at future visit.  - Suspect that with OHS/OSA, obesity, and his poor mobility that he will be a poor candidate for LVAD/transplant.  - Plan to repeat echo when GDMT optimized to look for EF recovery, imperative he  avoids cocaine. 2. CAD: Documented in Care Everywhere, reported h/o angioplasty, details unknown (cannot find a cath report though prior CAD is referenced). LHC showed no significant CAD. No chest pain. - Clopidogrel stopped since apixaban initiated.    - Continue rosuvastatin 20 mg daily.  3. H/o VT: Has CRT-D w/ chronic systolic heart failure and QRS > 150 ms  - Followed by [facility] device clinic  4. HTN: Mildly elevated.  - Continue Entresto. 5. Type 2DM: Hgb A1C >9.  - He is on insulin. - Continue SGLT2i, watch for GU and fungal infections. 6. Cocaine: Says he has quit for months now.    7. Depression: Depressed due to lack of social/family support and food insecurity  - Continue duloxetine. 8. Jehovah's Witness.  9. OHS/OSA: He uses oxygen during the day and CPAP at night.  This likely plays a role in his dyspnea.  10. H/o AKI: Last SCr 1.11. BMET today pending. 11. RUE DVT: Provoked. PICC line pulled. Had refused some doses of enoxaparin. - Continue apixaban 5 mg BID. Need to continue for at least 3 months.  12. SDOH: Engage HFSW to help with food stamps and social security.  Follow-up January 2024 with Dr.  McLean.  Audry Riles, PharmD, BCPS, BCCP, CPP Heart Failure Clinic Pharmacist [phone]

## 2022-05-25 NOTE — Patient Instructions (Signed)
It was a pleasure seeing you today!  MEDICATIONS: -No medication changes today -Call if you have questions about your medications.  LABS: -We will call you if your labs need attention.  In general, to take care of your heart failure: -Limit your fluid intake to 2 Liters (half-gallon) per day.   -Limit your salt intake to ideally 2-3 grams (2000-3000 mg) per day. -Weigh yourself daily and record, and bring that "weight diary" to your next appointment.  (Weight gain of 2-3 pounds in 1 day typically means fluid weight.) -The medications for your heart are to help your heart and help you live longer.   -Please contact us before stopping any of your heart medications.  Call the clinic at [phone] with questions or to reschedule future appointments.  

## 2022-05-25 NOTE — Progress Notes (Signed)
H&V Care Navigation CSW Progress Note  Clinical Social Worker consulted to help pt with food insecurity.  Pt reports he still doesn't have food stamps.  CSW has spoken with pt about this multiple times and patient was aware he needed to complete new application through [facility] after he turned his app in too late.  Pt has no reapplied at this time but has account through [employer] and will complete online ASAP.  CSW provided $50 in gift cards to help with current food needs.  Pt will be going to [place] later this week to be with family until January- does not have conerns about food for that time period.   SDOH Screenings   Food Insecurity: Food Insecurity Present (05/25/2022)  Housing: Medium Risk (05/04/2022)  Transportation Needs: No Transportation Needs (04/18/2022)  Utilities: Not At Risk (04/18/2022)  Alcohol Screen: Low Risk  (03/17/2022)  Depression (PHQ2-9): Medium Risk (03/30/2022)  Financial Resource Strain: Low Risk  (03/17/2022)  Tobacco Use: Medium Risk (04/29/2022)   Burna Sis, LCSW Clinical Social Worker Advanced Heart Failure Clinic Desk#: [phone] Cell#: [phone]

## 2022-06-01 ENCOUNTER — Encounter (HOSPITAL_COMMUNITY): Payer: Medicare Other | Admitting: Cardiology

## 2022-06-02 NOTE — Addendum Note (Signed)
Encounter addended by: Evon Slack, RPH-CPP on: 06/02/2022 3:32 PM  Actions taken: Vitals modified

## 2022-06-24 ENCOUNTER — Encounter: Payer: Medicare Other | Admitting: Cardiovascular Disease

## 2022-07-13 ENCOUNTER — Ambulatory Visit: Payer: Medicare HMO | Attending: Cardiovascular Disease | Admitting: Cardiovascular Disease

## 2022-07-13 ENCOUNTER — Encounter: Payer: Self-pay | Admitting: Cardiovascular Disease

## 2022-07-13 VITALS — BP 115/68 | HR 79 | Ht 66.0 in | Wt 292.0 lb

## 2022-07-13 DIAGNOSIS — Z95810 Presence of automatic (implantable) cardiac defibrillator: Secondary | ICD-10-CM

## 2022-07-13 DIAGNOSIS — Z8679 Personal history of other diseases of the circulatory system: Secondary | ICD-10-CM | POA: Diagnosis not present

## 2022-07-13 DIAGNOSIS — I5022 Chronic systolic (congestive) heart failure: Secondary | ICD-10-CM

## 2022-07-13 NOTE — Progress Notes (Signed)
Electrophysiology Office Note:    Date:  07/13/2022   ID:  Luis [name], DOB 01/13/1971, MRN [id number]  PCP:  Buzzy Han, MD (Inactive)   [facility] Cardiologist:  Glenetta Hew, MD Electrophysiologist:  Melida Quitter, MD     Referring MD: No ref. provider found   History of Present Illness:    Luis [name] is a 52 y.o. male with a hx listed below, significant for CHFrEF, NICM, BiV ICD, referred for arrhythmia management.  He underwent generator change for his Medtronic BiV ICD in September 2023.  he has no device related complaints -- no new tenderness, drainage, redness.    Past Medical History:  Diagnosis Date   Anxiety    Biventricular ICD (implantable cardioverter-defibrillator) in place    Chronic systolic CHF (congestive heart failure) (HCC)    Cocaine use    Diabetes mellitus without complication (HCC)    GERD (gastroesophageal reflux disease)    HLD (hyperlipidemia)    Hypertension    Morbid obesity (HCC)    NICM (nonischemic cardiomyopathy) (HCC)    OSA (obstructive sleep apnea)    PTSD (post-traumatic stress disorder)    on Depakote   Refusal of blood transfusions as patient is Jehovah's Witness    Tobacco abuse     Past Surgical History:  Procedure Laterality Date   BIV ICD INSERTION CRT-D     FOOT FRACTURE SURGERY     ICD GENERATOR CHANGEOUT N/A 03/23/2022   Procedure: ICD GENERATOR [place];  Surgeon: Myles Gip, Yetta Barre, MD;  Location: [facility];  Service: Cardiovascular;  Laterality: N/A;   RIGHT HEART CATH N/A 03/06/2022   Procedure: RIGHT HEART CATH;  Surgeon: Early Osmond, MD;  Location: [facility];  Service: Cardiovascular;  Laterality: N/A;   RIGHT/LEFT HEART CATH AND CORONARY ANGIOGRAPHY N/A 04/16/2022   Procedure: RIGHT/LEFT HEART CATH AND CORONARY ANGIOGRAPHY;  Surgeon: Larey Dresser, MD;  Location: [facility];  Service: Cardiovascular;  Laterality: N/A;    ROTATOR CUFF REPAIR     TONSILLECTOMY      Current Medications: Current Meds  Medication Sig   apixaban (ELIQUIS) 5 MG TABS tablet Take 1 tablet (5 mg total) by mouth 2 (two) times daily. Take 2 tablets (10 mg) twice a day through 10/29, starting 10/30 take 1 tablet (5 mg) twice a day   carvedilol (COREG) 3.125 MG tablet Take 1 tablet (3.125 mg total) by mouth 2 (two) times daily with a meal.   cyclobenzaprine (FLEXERIL) 10 MG tablet Take 10 mg by mouth daily.   dapagliflozin propanediol (FARXIGA) 10 MG TABS tablet Take 1 tablet (10 mg total) by mouth daily.   digoxin (LANOXIN) 0.125 MG tablet Take 1 tablet (0.125 mg total) by mouth daily.   DULoxetine (CYMBALTA) 30 MG capsule Take 3 capsules (90 mg total) by mouth daily.   hydrOXYzine (ATARAX) 25 MG tablet Take 1 tablet (25 mg total) by mouth 3 (three) times daily as needed for anxiety.   Insulin Lispro Prot & Lispro (HUMALOG MIX 75/25 KWIKPEN) (75-25) 100 UNIT/ML Kwikpen Inject 70 Units into the skin 3 (three) times daily.   Potassium Chloride ER 20 MEQ TBCR Take 1 tablet by mouth daily.   prazosin (MINIPRESS) 2 MG capsule Take 1 capsule (2 mg total) by mouth at bedtime.   rosuvastatin (CRESTOR) 20 MG tablet Take 1 tablet (20 mg total) by mouth daily.   sacubitril-valsartan (ENTRESTO) 97-103 MG Take 1 tablet by mouth 2 (  two) times daily.   spironolactone (ALDACTONE) 25 MG tablet Take 1 tablet (25 mg total) by mouth daily.   torsemide (DEMADEX) 20 MG tablet Take 3 tablets (60 mg total) by mouth daily.   traZODone (DESYREL) 100 MG tablet Take 2 tablets (200 mg total) by mouth at bedtime.   TRULICITY 1.5 WU/9.8JX SOPN Inject 1.5 mg into the skin once a week.     Allergies:   Aspirin, Iodine-131, Peanut (diagnostic), Iodinated contrast media, Latex, Penicillins, Ultram [tramadol], Zestril [lisinopril], Augmentin [amoxicillin-pot clavulanate], and Lidocaine   Social History   Socioeconomic History   Marital status: Widowed    Spouse name:  Not on file   Number of children: 7   Years of education: Not on file   Highest education level: Bachelor's degree (e.g., BA, AB, BS)  Occupational History   Occupation: disability  Tobacco Use   Smoking status: Former    Packs/day: 0.20    Types: Cigarettes    Quit date: 10/2021    Years since quitting: 0.7   Smokeless tobacco: Never   Tobacco comments:    occasionally  Vaping Use   Vaping Use: Never used  Substance and Sexual Activity   Alcohol use: Not Currently   Drug use: Yes    Types: Cocaine    Comment: last used july   Sexual activity: Not Currently  Other Topics Concern   Not on file  Social History Narrative   Not on file   Social Determinants of Health   Financial Resource Strain: Low Risk  (03/17/2022)   Overall Financial Resource Strain (CARDIA)    Difficulty of Paying Living Expenses: Not hard at all  Food Insecurity: Food Insecurity Present (05/25/2022)   Hunger Vital Sign    Worried About Running Out of Food in the Last Year: Often true    Ran Out of Food in the Last Year: Often true  Transportation Needs: No Transportation Needs (04/18/2022)   PRAPARE - Hydrologist (Medical): No    Lack of Transportation (Non-Medical): No  Physical Activity: Not on file  Stress: Not on file  Social Connections: Not on file     Family History: The patient's family history includes Bone cancer in his father; Hypertension in his mother; Lung cancer in his father.  ROS:   Please see the history of present illness.    All other systems reviewed and are negative.  EKGs/Labs/Other Studies Reviewed Today:     TTE 02/2022: EF 30-35%, grade II DD  EKG:  Last EKG results: today - A-V paced   Recent Labs: 03/01/2022: TSH 0.117 03/25/2022: Magnesium 2.2 04/16/2022: ALT 18 04/29/2022: Hemoglobin 15.4; Platelets 298 05/25/2022: B Natriuretic Peptide 228.5; BUN 10; Creatinine, Ser 0.69; Potassium 4.1; Sodium 136     Physical Exam:    VS:   BP 115/68 (BP Location: Right Arm, Cuff Size: Large)   Pulse 79   Ht 5\' 6"  (1.676 m)   Wt 292 lb (132.5 kg)   SpO2 99%   BMI 47.13 kg/m     Wt Readings from Last 3 Encounters:  07/13/22 292 lb (132.5 kg)  05/25/22 298 lb 3.2 oz (135.3 kg)  04/29/22 293 lb (132.9 kg)     GEN:  Well nourished, well developed in no acute distress CARDIAC: RRR, no murmurs, rubs, gallops The device site is normal -- no tenderness, edema, drainage, redness, threatened erosion. RESPIRATORY:  Normal work of breathing MUSCULOSKELETAL: no edema    ASSESSMENT & PLAN:  CHFrEF: continue following with Dr. Shirlee Latch in heart failure clinic. Continue carvedilol 3.125, farxiga 10mg , digoxin 0.125, entresto 97-103, aldactone 25, torsemide 20.  Medtronic BiV ICD: normal function 3.   Fatigue, cramps: will check electrolytes. Optivol shows recent volume overload which has returned to normal.        Medication Adjustments/Labs and Tests Ordered: Current medicines are reviewed at length with the patient today.  Concerns regarding medicines are outlined above.  Orders Placed This Encounter  Procedures   Basic metabolic panel   Magnesium   EKG 12-Lead   No orders of the defined types were placed in this encounter.    Signed, , MD  07/13/2022 2:52 PM    [facility]

## 2022-07-13 NOTE — Patient Instructions (Addendum)
Medication Instructions:  Your physician recommends that you continue on your current medications as directed. Please refer to the Current Medication list given to you today.  *If you need a refill on your cardiac medications before your next appointment, please call your pharmacy*  Lab Work: TODAY: BMET, magnesium If you have labs (blood work) drawn today and your tests are completely normal, you will receive your results only by: [place] (if you have MyChart) OR A paper copy in the mail If you have any lab test that is abnormal or we need to change your treatment, we will call you to review the results.  Testing/Procedures: None ordered  Follow-Up: At [facility], you and your health needs are our priority.  As part of our continuing mission to provide you with exceptional heart care, we have created designated Provider Care Teams.  These Care Teams include your primary Cardiologist (physician) and Advanced Practice Providers (APPs -  Physician Assistants and Nurse Practitioners) who all work together to provide you with the care you need, when you need it.  Your next appointment:   1 year(s)  The format for your next appointment:   In Person  Provider:   You may see Melida Quitter, MD or one of the following Advanced Practice Providers on your designated Care Team:   Tommye Standard, [place] Legrand Como "[place]" [place], PA-C [place], [place]

## 2022-07-14 LAB — BASIC METABOLIC PANEL
BUN/Creatinine Ratio: 8 — ABNORMAL LOW (ref 9–20)
BUN: 9 mg/dL (ref 6–24)
CO2: 27 mmol/L (ref 20–29)
Calcium: 9.6 mg/dL (ref 8.7–10.2)
Chloride: 97 mmol/L (ref 96–106)
Creatinine, Ser: 1.12 mg/dL (ref 0.76–1.27)
Glucose: 270 mg/dL — ABNORMAL HIGH (ref 70–99)
Potassium: 4.1 mmol/L (ref 3.5–5.2)
Sodium: 142 mmol/L (ref 134–144)
eGFR: 79 mL/min/{1.73_m2} (ref >59)

## 2022-07-14 LAB — MAGNESIUM: Magnesium: 2.2 mg/dL (ref 1.6–2.3)

## 2022-10-03 ENCOUNTER — Emergency Department (HOSPITAL_COMMUNITY): Payer: Medicare HMO

## 2022-10-03 ENCOUNTER — Emergency Department (HOSPITAL_COMMUNITY)
Admission: EM | Admit: 2022-10-03 | Discharge: 2022-10-03 | Disposition: A | Discharge status: Home / Self Care | Payer: Medicare HMO | Attending: Emergency Medicine | Admitting: Emergency Medicine

## 2022-10-03 ENCOUNTER — Other Ambulatory Visit: Payer: Self-pay

## 2022-10-03 DIAGNOSIS — S01511A Laceration without foreign body of lip, initial encounter: Secondary | ICD-10-CM | POA: Insufficient documentation

## 2022-10-03 DIAGNOSIS — E119 Type 2 diabetes mellitus without complications: Secondary | ICD-10-CM | POA: Diagnosis not present

## 2022-10-03 DIAGNOSIS — S20219A Contusion of unspecified front wall of thorax, initial encounter: Secondary | ICD-10-CM | POA: Insufficient documentation

## 2022-10-03 DIAGNOSIS — S0990XA Unspecified injury of head, initial encounter: Secondary | ICD-10-CM | POA: Diagnosis present

## 2022-10-03 DIAGNOSIS — M546 Pain in thoracic spine: Secondary | ICD-10-CM | POA: Diagnosis not present

## 2022-10-03 DIAGNOSIS — I509 Heart failure, unspecified: Secondary | ICD-10-CM | POA: Insufficient documentation

## 2022-10-03 DIAGNOSIS — Z91040 Latex allergy status: Secondary | ICD-10-CM | POA: Insufficient documentation

## 2022-10-03 DIAGNOSIS — Z794 Long term (current) use of insulin: Secondary | ICD-10-CM | POA: Diagnosis not present

## 2022-10-03 DIAGNOSIS — S0512XA Contusion of eyeball and orbital tissues, left eye, initial encounter: Secondary | ICD-10-CM | POA: Diagnosis not present

## 2022-10-03 DIAGNOSIS — Z79899 Other long term (current) drug therapy: Secondary | ICD-10-CM | POA: Diagnosis not present

## 2022-10-03 DIAGNOSIS — S0511XA Contusion of eyeball and orbital tissues, right eye, initial encounter: Secondary | ICD-10-CM | POA: Insufficient documentation

## 2022-10-03 DIAGNOSIS — Z7901 Long term (current) use of anticoagulants: Secondary | ICD-10-CM | POA: Insufficient documentation

## 2022-10-03 DIAGNOSIS — M545 Low back pain, unspecified: Secondary | ICD-10-CM | POA: Insufficient documentation

## 2022-10-03 DIAGNOSIS — R109 Unspecified abdominal pain: Secondary | ICD-10-CM | POA: Diagnosis not present

## 2022-10-03 DIAGNOSIS — H538 Other visual disturbances: Secondary | ICD-10-CM | POA: Insufficient documentation

## 2022-10-03 DIAGNOSIS — H539 Unspecified visual disturbance: Secondary | ICD-10-CM

## 2022-10-03 DIAGNOSIS — I110 Hypertensive heart disease with heart failure: Secondary | ICD-10-CM | POA: Insufficient documentation

## 2022-10-03 LAB — CBC WITH DIFFERENTIAL/PLATELET
Abs Immature Granulocytes: 0.03 10*3/uL (ref 0.00–0.07)
Basophils Absolute: 0 10*3/uL (ref 0.0–0.1)
Basophils Relative: 0 %
Eosinophils Absolute: 0 10*3/uL (ref 0.0–0.5)
Eosinophils Relative: 0 %
HCT: 42.6 % (ref 39.0–52.0)
Hemoglobin: 13.5 g/dL (ref 13.0–17.0)
Immature Granulocytes: 0 %
Lymphocytes Relative: 22 %
Lymphs Abs: 1.9 10*3/uL (ref 0.7–4.0)
MCH: 29.2 pg (ref 26.0–34.0)
MCHC: 31.7 g/dL (ref 30.0–36.0)
MCV: 92 fL (ref 80.0–100.0)
Monocytes Absolute: 0.7 10*3/uL (ref 0.1–1.0)
Monocytes Relative: 8 %
Neutro Abs: 6.1 10*3/uL (ref 1.7–7.7)
Neutrophils Relative %: 70 %
Platelets: 219 10*3/uL (ref 150–400)
RBC: 4.63 MIL/uL (ref 4.22–5.81)
RDW: 13.2 % (ref 11.5–15.5)
WBC: 8.7 10*3/uL (ref 4.0–10.5)
nRBC: 0 % (ref 0.0–0.2)

## 2022-10-03 LAB — URINALYSIS, ROUTINE W REFLEX MICROSCOPIC
Bacteria, UA: NONE SEEN
Bilirubin Urine: NEGATIVE
Glucose, UA: NEGATIVE mg/dL
Ketones, ur: 5 mg/dL — AB
Leukocytes,Ua: NEGATIVE
Nitrite: NEGATIVE
Protein, ur: NEGATIVE mg/dL
Specific Gravity, Urine: 1.009 (ref 1.005–1.030)
pH: 5 (ref 5.0–8.0)

## 2022-10-03 LAB — COMPREHENSIVE METABOLIC PANEL
ALT: 15 U/L (ref 0–44)
AST: 17 U/L (ref 15–41)
Albumin: 3.3 g/dL — ABNORMAL LOW (ref 3.5–5.0)
Alkaline Phosphatase: 38 U/L (ref 38–126)
Anion gap: 8 (ref 5–15)
BUN: 6 mg/dL (ref 6–20)
CO2: 26 mmol/L (ref 22–32)
Calcium: 8.5 mg/dL — ABNORMAL LOW (ref 8.9–10.3)
Chloride: 102 mmol/L (ref 98–111)
Creatinine, Ser: 0.87 mg/dL (ref 0.61–1.24)
GFR, Estimated: >60 mL/min (ref >60)
Glucose, Bld: 195 mg/dL — ABNORMAL HIGH (ref 70–99)
Potassium: 3.7 mmol/L (ref 3.5–5.1)
Sodium: 136 mmol/L (ref 135–145)
Total Bilirubin: 0.5 mg/dL (ref 0.3–1.2)
Total Protein: 5.9 g/dL — ABNORMAL LOW (ref 6.5–8.1)

## 2022-10-03 LAB — PROTIME-INR
INR: 1 (ref 0.8–1.2)
Prothrombin Time: 12.9 seconds (ref 11.4–15.2)

## 2022-10-03 MED ORDER — FENTANYL CITRATE PF 50 MCG/ML IJ SOSY
50.0000 ug | PREFILLED_SYRINGE | Freq: Once | INTRAMUSCULAR | Status: AC
  Administered 2022-10-03: 50 ug via INTRAVENOUS
  Filled 2022-10-03: qty 1

## 2022-10-03 MED ORDER — IOHEXOL 350 MG/ML SOLN
100.0000 mL | Freq: Once | INTRAVENOUS | Status: AC | PRN
  Administered 2022-10-03: 100 mL via INTRAVENOUS

## 2022-10-03 MED ORDER — HYDROCODONE-ACETAMINOPHEN 5-325 MG PO TABS: 2.0000 | ORAL_TABLET | Freq: Four times a day (QID) | ORAL | 0 refills | Status: AC | PRN

## 2022-10-03 MED ORDER — METHYLPREDNISOLONE SODIUM SUCC 40 MG IJ SOLR
40.0000 mg | Freq: Once | INTRAMUSCULAR | Status: AC
  Administered 2022-10-03: 40 mg via INTRAVENOUS
  Filled 2022-10-03: qty 1

## 2022-10-03 MED ORDER — DIPHENHYDRAMINE HCL 50 MG/ML IJ SOLN
50.0000 mg | Freq: Once | INTRAMUSCULAR | Status: AC
  Administered 2022-10-03: 50 mg via INTRAVENOUS
  Filled 2022-10-03: qty 1

## 2022-10-03 MED ORDER — OXYCODONE HCL 5 MG PO TABS
10.0000 mg | ORAL_TABLET | Freq: Once | ORAL | Status: AC
  Administered 2022-10-03: 10 mg via ORAL
  Filled 2022-10-03: qty 2

## 2022-10-03 MED ORDER — DIPHENHYDRAMINE HCL 25 MG PO CAPS: 50.0000 mg | ORAL_CAPSULE | Freq: Once | ORAL | Status: AC

## 2022-10-03 MED ORDER — ONDANSETRON HCL 4 MG/2ML IJ SOLN
4.0000 mg | Freq: Once | INTRAMUSCULAR | Status: AC
  Administered 2022-10-03: 4 mg via INTRAVENOUS
  Filled 2022-10-03: qty 2

## 2022-10-03 MED ORDER — DIPHENHYDRAMINE HCL 25 MG PO CAPS
50.0000 mg | ORAL_CAPSULE | Freq: Once | ORAL | Status: AC
  Administered 2022-10-03: 50 mg via ORAL
  Filled 2022-10-03: qty 2
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
## 2022-10-03 NOTE — ED Notes (Signed)
Medication given for contrast allergy

## 2022-10-03 NOTE — Discharge Instructions (Addendum)
Your CT images were negative for any fractures but you do have vision changes that ophthalmology wants to evaluate. You will be sent across the street for further evaluation.   You have been given a prescription for norco to take as needed. You can take ibuprofen 800 mg three times a day and tylenol 1000 mg three times a day, using caution as norco also contains tylenol and the max dose in a day is 4000 mg.  You should return if you have severe worsening of headache, persistent vomiting, numbness/weakenss in arms or legs, urinary or bowel incontinence, or other concerns.
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
## 2022-10-03 NOTE — ED Notes (Signed)
Patient transported to CT 

## 2022-10-05 ENCOUNTER — Ambulatory Visit (HOSPITAL_COMMUNITY)
Admission: EM | Admit: 2022-10-05 | Discharge: 2022-10-06 | Disposition: A | Discharge status: Other Acute Inpatient Hospital | Payer: Medicare HMO | Attending: Psychiatry | Admitting: Psychiatry

## 2022-10-05 DIAGNOSIS — R45851 Suicidal ideations: Secondary | ICD-10-CM | POA: Insufficient documentation

## 2022-10-05 DIAGNOSIS — Z91148 Patient's other noncompliance with medication regimen for other reason: Secondary | ICD-10-CM | POA: Diagnosis not present

## 2022-10-05 DIAGNOSIS — I509 Heart failure, unspecified: Secondary | ICD-10-CM | POA: Diagnosis not present

## 2022-10-05 DIAGNOSIS — F332 Major depressive disorder, recurrent severe without psychotic features: Secondary | ICD-10-CM | POA: Diagnosis present

## 2022-10-05 DIAGNOSIS — F1410 Cocaine abuse, uncomplicated: Secondary | ICD-10-CM | POA: Diagnosis not present

## 2022-10-05 DIAGNOSIS — K219 Gastro-esophageal reflux disease without esophagitis: Secondary | ICD-10-CM | POA: Insufficient documentation

## 2022-10-05 DIAGNOSIS — Z1152 Encounter for screening for COVID-19: Secondary | ICD-10-CM | POA: Insufficient documentation

## 2022-10-05 DIAGNOSIS — F419 Anxiety disorder, unspecified: Secondary | ICD-10-CM | POA: Insufficient documentation

## 2022-10-05 DIAGNOSIS — E119 Type 2 diabetes mellitus without complications: Secondary | ICD-10-CM | POA: Diagnosis not present

## 2022-10-05 DIAGNOSIS — Z950 Presence of cardiac pacemaker: Secondary | ICD-10-CM | POA: Diagnosis not present

## 2022-10-05 DIAGNOSIS — I110 Hypertensive heart disease with heart failure: Secondary | ICD-10-CM | POA: Diagnosis not present

## 2022-10-05 LAB — TSH: TSH: 0.574 u[IU]/mL (ref 0.350–4.500)

## 2022-10-05 LAB — CBC WITH DIFFERENTIAL/PLATELET
Abs Immature Granulocytes: 0.02 10*3/uL (ref 0.00–0.07)
Basophils Absolute: 0 10*3/uL (ref 0.0–0.1)
Basophils Relative: 0 %
Eosinophils Absolute: 0 10*3/uL (ref 0.0–0.5)
Eosinophils Relative: 1 %
HCT: 43.5 % (ref 39.0–52.0)
Hemoglobin: 14.5 g/dL (ref 13.0–17.0)
Immature Granulocytes: 0 %
Lymphocytes Relative: 25 %
Lymphs Abs: 2.1 10*3/uL (ref 0.7–4.0)
MCH: 29.7 pg (ref 26.0–34.0)
MCHC: 33.3 g/dL (ref 30.0–36.0)
MCV: 89.1 fL (ref 80.0–100.0)
Monocytes Absolute: 0.6 10*3/uL (ref 0.1–1.0)
Monocytes Relative: 7 %
Neutro Abs: 5.6 10*3/uL (ref 1.7–7.7)
Neutrophils Relative %: 67 %
Platelets: 265 10*3/uL (ref 150–400)
RBC: 4.88 MIL/uL (ref 4.22–5.81)
RDW: 13.2 % (ref 11.5–15.5)
WBC: 8.4 10*3/uL (ref 4.0–10.5)
nRBC: 0 % (ref 0.0–0.2)

## 2022-10-05 LAB — POCT URINE DRUG SCREEN - MANUAL ENTRY (I-SCREEN)
POC Amphetamine UR: NOT DETECTED
POC Buprenorphine (BUP): NOT DETECTED
POC Cocaine UR: POSITIVE — AB
POC Marijuana UR: NOT DETECTED — AB
POC Methadone UR: NOT DETECTED
POC Methamphetamine UR: NOT DETECTED
POC Morphine: NOT DETECTED
POC Oxazepam (BZO): NOT DETECTED
POC Oxycodone UR: NOT DETECTED — AB
POC Secobarbital (BAR): NOT DETECTED

## 2022-10-05 LAB — COMPREHENSIVE METABOLIC PANEL
ALT: 15 U/L (ref 0–44)
AST: 15 U/L (ref 15–41)
Albumin: 3.6 g/dL (ref 3.5–5.0)
Alkaline Phosphatase: 43 U/L (ref 38–126)
Anion gap: 9 (ref 5–15)
BUN: 11 mg/dL (ref 6–20)
CO2: 27 mmol/L (ref 22–32)
Calcium: 8.9 mg/dL (ref 8.9–10.3)
Chloride: 103 mmol/L (ref 98–111)
Creatinine, Ser: 0.82 mg/dL (ref 0.61–1.24)
GFR, Estimated: >60 mL/min (ref >60)
Glucose, Bld: 162 mg/dL — ABNORMAL HIGH (ref 70–99)
Potassium: 3.9 mmol/L (ref 3.5–5.1)
Sodium: 139 mmol/L (ref 135–145)
Total Bilirubin: 0.6 mg/dL (ref 0.3–1.2)
Total Protein: 6.2 g/dL — ABNORMAL LOW (ref 6.5–8.1)

## 2022-10-05 LAB — GLUCOSE, CAPILLARY
Glucose-Capillary: 330 mg/dL — ABNORMAL HIGH (ref 70–99)
Glucose-Capillary: 271 mg/dL — ABNORMAL HIGH (ref 70–99)

## 2022-10-05 LAB — URINALYSIS, COMPLETE (UACMP) WITH MICROSCOPIC
Bilirubin Urine: NEGATIVE
Glucose, UA: NEGATIVE mg/dL
Ketones, ur: 5 mg/dL — AB
Leukocytes,Ua: NEGATIVE
Nitrite: NEGATIVE
Protein, ur: 30 mg/dL — AB
Specific Gravity, Urine: 1.031 — ABNORMAL HIGH (ref 1.005–1.030)
pH: 5 (ref 5.0–8.0)

## 2022-10-05 LAB — LIPID PANEL
Cholesterol: 174 mg/dL (ref 0–200)
HDL: 46 mg/dL (ref >40)
LDL Cholesterol: 97 mg/dL (ref 0–99)
Total CHOL/HDL Ratio: 3.8 RATIO
Triglycerides: 154 mg/dL — ABNORMAL HIGH (ref <150)
VLDL: 31 mg/dL (ref 0–40)

## 2022-10-05 LAB — HEMOGLOBIN A1C
Hgb A1c MFr Bld: 9.2 % — ABNORMAL HIGH (ref 4.8–5.6)
Mean Plasma Glucose: 217.34 mg/dL

## 2022-10-05 LAB — MAGNESIUM: Magnesium: 2.1 mg/dL (ref 1.7–2.4)

## 2022-10-05 LAB — ETHANOL: Alcohol, Ethyl (B): <10 mg/dL (ref <10)

## 2022-10-05 LAB — SARS CORONAVIRUS 2 BY RT PCR: SARS Coronavirus 2 by RT PCR: NEGATIVE

## 2022-10-05 MED ORDER — ALUM & MAG HYDROXIDE-SIMETH 200-200-20 MG/5ML PO SUSP: 30.0000 mL | ORAL | Status: DC | PRN

## 2022-10-05 MED ORDER — SPIRONOLACTONE 25 MG PO TABS
25.0000 mg | ORAL_TABLET | Freq: Every day | ORAL | Status: DC
  Administered 2022-10-06: 25 mg via ORAL
  Filled 2022-10-05: qty 1

## 2022-10-05 MED ORDER — INSULIN ASPART PROT & ASPART (70-30 MIX) 100 UNIT/ML ~~LOC~~ SUSP
60.0000 [IU] | Freq: Three times a day (TID) | SUBCUTANEOUS | Status: DC
  Administered 2022-10-06 (×2): 60 [IU] via SUBCUTANEOUS

## 2022-10-05 MED ORDER — HYDROXYZINE HCL 25 MG PO TABS: 25.0000 mg | ORAL_TABLET | Freq: Three times a day (TID) | ORAL | Status: DC | PRN

## 2022-10-05 MED ORDER — TORSEMIDE 20 MG PO TABS
60.0000 mg | ORAL_TABLET | Freq: Every day | ORAL | Status: DC
  Administered 2022-10-06: 60 mg via ORAL
  Filled 2022-10-05 (×2): qty 3

## 2022-10-05 MED ORDER — DAPAGLIFLOZIN PROPANEDIOL 10 MG PO TABS
10.0000 mg | ORAL_TABLET | Freq: Every day | ORAL | Status: DC
  Administered 2022-10-06: 10 mg via ORAL
  Filled 2022-10-05 (×2): qty 1

## 2022-10-05 MED ORDER — APIXABAN 5 MG PO TABS
5.0000 mg | ORAL_TABLET | Freq: Two times a day (BID) | ORAL | Status: DC
  Administered 2022-10-05 – 2022-10-06 (×2): 5 mg via ORAL
  Filled 2022-10-05 (×2): qty 1

## 2022-10-05 MED ORDER — SACUBITRIL-VALSARTAN 97-103 MG PO TABS
1.0000 | ORAL_TABLET | Freq: Two times a day (BID) | ORAL | Status: DC
  Administered 2022-10-06: 1 via ORAL
  Filled 2022-10-05 (×4): qty 1

## 2022-10-05 MED ORDER — HYDROXYZINE HCL 25 MG PO TABS
25.0000 mg | ORAL_TABLET | Freq: Three times a day (TID) | ORAL | Status: DC | PRN
  Administered 2022-10-05 (×2): 25 mg via ORAL
  Filled 2022-10-05 (×2): qty 1

## 2022-10-05 MED ORDER — TRAZODONE HCL 100 MG PO TABS
200.0000 mg | ORAL_TABLET | Freq: Every day | ORAL | Status: DC
  Administered 2022-10-05: 200 mg via ORAL
  Filled 2022-10-05: qty 2

## 2022-10-05 MED ORDER — ROSUVASTATIN CALCIUM 20 MG PO TABS
20.0000 mg | ORAL_TABLET | Freq: Every day | ORAL | Status: DC
  Administered 2022-10-06: 20 mg via ORAL
  Filled 2022-10-05: qty 1

## 2022-10-05 MED ORDER — HYDROCODONE-ACETAMINOPHEN 5-325 MG PO TABS
2.0000 | ORAL_TABLET | Freq: Four times a day (QID) | ORAL | Status: DC | PRN
  Administered 2022-10-05 – 2022-10-06 (×3): 2 via ORAL
  Filled 2022-10-05 (×3): qty 2

## 2022-10-05 MED ORDER — INSULIN LISPRO PROT & LISPRO (75-25 MIX) 100 UNIT/ML KWIKPEN: 60.0000 [IU] | PEN_INJECTOR | Freq: Three times a day (TID) | SUBCUTANEOUS | Status: DC

## 2022-10-05 MED ORDER — MAGNESIUM HYDROXIDE 400 MG/5ML PO SUSP: 30.0000 mL | Freq: Every day | ORAL | Status: DC | PRN

## 2022-10-05 MED ORDER — PRAZOSIN HCL 2 MG PO CAPS
2.0000 mg | ORAL_CAPSULE | Freq: Every day | ORAL | Status: DC
  Administered 2022-10-05: 2 mg via ORAL
  Filled 2022-10-05: qty 1

## 2022-10-05 MED ORDER — TRAZODONE HCL 50 MG PO TABS
50.0000 mg | ORAL_TABLET | Freq: Every evening | ORAL | Status: DC | PRN
  Administered 2022-10-05: 50 mg via ORAL
  Filled 2022-10-05: qty 1

## 2022-10-05 MED ORDER — INSULIN ASPART 100 UNIT/ML IJ SOLN
0.0000 [IU] | Freq: Three times a day (TID) | INTRAMUSCULAR | Status: DC
  Administered 2022-10-05: 7 [IU] via SUBCUTANEOUS
  Administered 2022-10-06 (×2): 3 [IU] via SUBCUTANEOUS

## 2022-10-05 MED ORDER — SACUBITRIL-VALSARTAN 49-51 MG PO TABS
2.0000 | ORAL_TABLET | ORAL | Status: AC
  Administered 2022-10-05: 2 via ORAL
  Filled 2022-10-05: qty 2

## 2022-10-05 MED ORDER — INSULIN GLARGINE-YFGN 100 UNIT/ML ~~LOC~~ SOLN
20.0000 [IU] | Freq: Every day | SUBCUTANEOUS | Status: DC
  Administered 2022-10-05: 20 [IU] via SUBCUTANEOUS

## 2022-10-05 MED ORDER — ACETAMINOPHEN 325 MG PO TABS
650.0000 mg | ORAL_TABLET | Freq: Four times a day (QID) | ORAL | Status: DC | PRN
  Administered 2022-10-05: 650 mg via ORAL
  Filled 2022-10-05: qty 2
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
## 2022-10-05 NOTE — Progress Notes (Signed)
CSW received phone call from Vernona Rieger, Clinical cytogeneticist, at [facility]. Vernona Rieger reports that [address], [place], [place], and [place] are all on mental health diversion due to no available beds. CSW faxed referral to out of network providers.  Cathie Beams, [place]  10/05/2022 11:17 AM

## 2022-10-05 NOTE — ED Provider Notes (Cosign Needed Addendum)
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    Recommendations  Based on my evaluation the patient does not appear to have an emergency medical condition.  Patient meets criteria for inpatient psychiatric admission.  He will be admitted to the continuous assessment unit while awaiting inpatient bed  availability.   Meds ordered this encounter  Medications   acetaminophen (TYLENOL) tablet 650 mg Q6H prn    alum & mag hydroxide-simeth (MAALOX/MYLANTA) 200-200-20  MG/5ML suspension 30 mL PRN DAILY   magnesium hydroxide (MILK OF MAGNESIA) suspension 30 mL PRM daily   hydrOXYzine (ATARAX) tablet 25 mg TID PRN   traZODone (DESYREL) tablet 50 mg QHS PRN   apixaban (ELIQUIS) tablet 5 mg BID   insulin glargine-yfgn (SEMGLEE) injection 20 Units QHS   torsemide (DEMADEX) tablet 60 mg daily   insulin aspart (novoLOG) injection 0-9 Units -SS    Order Specific Question:   Correction coverage:    Answer:   Sensitive (thin, NPO, renal)    Order Specific Question:   CBG < 70:    Answer:   Implement Hypoglycemia Standing Orders and refer to Hypoglycemia Standing Orders sidebar report    Order Specific Question:   CBG 70 - 120:    Answer:   0 units    Order Specific Question:   CBG 121 - 150:    Answer:   1 unit    Order Specific Question:   CBG 151 - 200:    Answer:   2 units    Order Specific Question:   CBG 201 - 250:    Answer:   3 units    Order Specific Question:   CBG 251 - 300:    Answer:   5 units    Order Specific Question:   CBG 301 - 350:    Answer:   7 units    Order Specific Question:   CBG 351 - 400    Answer:   9 units    Order Specific Question:   CBG > 400    Answer:   call MD and obtain STAT lab verification    Diabetes consult placed  Lab Orders         SARS Coronavirus 2 by RT PCR (hospital order, performed in [facility] hospital lab) *cepheid single result test* Anterior Nasal Swab         CBC with Differential/Platelet         Comprehensive metabolic panel         Hemoglobin A1c         Magnesium         Ethanol         Lipid panel         TSH         Urinalysis, Complete w Microscopic -Urine, Clean Catch         POCT Urine Drug Screen - (I-Screen)      EKG QTC 497   Ardis Hughs, NP 10/05/22  11:17 AM
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
## 2022-10-05 NOTE — ED Notes (Signed)
Pt sleeping at present, no distress noted.  Monitoring for safety. 

## 2022-10-05 NOTE — ED Notes (Signed)
Patient A&Ox4. He is currently endorsing suicidal ideations. He endorses SI with intent, stating he will use whatever means is near him at the moment. Patient denies HI and AVH. Patient denies any physical complaints when asked. No acute distress noted. Support and encouragement provided. Routine safety checks conducted according to facility protocol. Encouraged patient to notify staff if thoughts of harm toward self or others arise. Patient verbalize understanding and agreement. Will continue to monitor for safety.

## 2022-10-05 NOTE — ED Notes (Signed)
Pt sleeping@this time. Breathing even and unlabored. Will continue to monitor for safety 

## 2022-10-05 NOTE — Progress Notes (Signed)
LCSW Progress Note  [id number]   Luis [name]  10/05/2022  10:51 AM  Description:   Inpatient Psychiatric Referral  Patient was recommended inpatient per Vernard Gambles, NP. [facility]. Patient was referred to the following facilities:   Destination  Service Provider Address Phone Fax  [facility]  [address] [place] [address] [phone]  [facility]  [address] [place] [address] [phone]  [facility] [address]  [address] [place] [address] [phone]  [facility] [address] [place]  [address] [place] [address] [phone]  [facility]  [address] [place], [place] [address] [phone]  [facility]  [address] [place] [address] [phone]  [facility]  [address] [place] [address] [phone]  [facility]  [address] [place] [address] [phone]  [facility]  [address] [place], [place] [address] [phone]  [facility]  [address] [place] [address] [phone]  [facility]  [address] [place] [address] [phone]  [facility]  [address] [place] [address] [phone]  [facility]  [address], [place] [address] [phone]  [facility]  [address] [place] [address] [phone]  [facility]  [address] [place] [address] [phone]  [facility]  [address], [place] [address]  [phone]  [facility]  [address], [place] [address] [phone]  [facility]  [address] [place] [address] [phone]  [facility]  [address], [place] [address] [phone]  [facility]  [address], [place] [address] [phone]  [facility]  [address] [place] [address] [phone]  [facility]  [address] [place] [address] [phone]  [facility]  [address] [place], [place] [address] [phone]  [facility]  [address] [place] [address] [phone]  [facility]  [address] [place] [address] [phone]  [facility]  [address] [place] [address] [phone]  [facility]  [address], [place] [address] [phone]  [facility]  [address] [place] [address] [phone]    Situation ongoing, CSW to continue following and update chart as more information becomes available.      Cathie Beams, Theresia Majors  10/05/2022 10:51 AM

## 2022-10-06 ENCOUNTER — Inpatient Hospital Stay (HOSPITAL_COMMUNITY)
Admission: AD | Admit: 2022-10-06 | Discharge: 2022-10-17 | DRG: 885 | Disposition: A | Discharge status: Home / Self Care | Payer: Medicare HMO | Source: Intra-hospital | Attending: Psychiatry | Admitting: Psychiatry

## 2022-10-06 ENCOUNTER — Encounter (HOSPITAL_COMMUNITY): Payer: Self-pay | Admitting: Psychiatry

## 2022-10-06 ENCOUNTER — Other Ambulatory Visit: Payer: Self-pay

## 2022-10-06 DIAGNOSIS — Z634 Disappearance and death of family member: Secondary | ICD-10-CM

## 2022-10-06 DIAGNOSIS — Z833 Family history of diabetes mellitus: Secondary | ICD-10-CM

## 2022-10-06 DIAGNOSIS — Z95810 Presence of automatic (implantable) cardiac defibrillator: Secondary | ICD-10-CM | POA: Diagnosis not present

## 2022-10-06 DIAGNOSIS — Z79899 Other long term (current) drug therapy: Secondary | ICD-10-CM

## 2022-10-06 DIAGNOSIS — Z9151 Personal history of suicidal behavior: Secondary | ICD-10-CM | POA: Diagnosis not present

## 2022-10-06 DIAGNOSIS — G4733 Obstructive sleep apnea (adult) (pediatric): Secondary | ICD-10-CM | POA: Diagnosis present

## 2022-10-06 DIAGNOSIS — F4310 Post-traumatic stress disorder, unspecified: Secondary | ICD-10-CM | POA: Diagnosis present

## 2022-10-06 DIAGNOSIS — S0990XA Unspecified injury of head, initial encounter: Secondary | ICD-10-CM

## 2022-10-06 DIAGNOSIS — M545 Low back pain, unspecified: Secondary | ICD-10-CM | POA: Diagnosis present

## 2022-10-06 DIAGNOSIS — Z7901 Long term (current) use of anticoagulants: Secondary | ICD-10-CM

## 2022-10-06 DIAGNOSIS — Z8249 Family history of ischemic heart disease and other diseases of the circulatory system: Secondary | ICD-10-CM | POA: Diagnosis not present

## 2022-10-06 DIAGNOSIS — F22 Delusional disorders: Secondary | ICD-10-CM | POA: Diagnosis present

## 2022-10-06 DIAGNOSIS — G8929 Other chronic pain: Secondary | ICD-10-CM | POA: Diagnosis present

## 2022-10-06 DIAGNOSIS — Z886 Allergy status to analgesic agent status: Secondary | ICD-10-CM

## 2022-10-06 DIAGNOSIS — R45851 Suicidal ideations: Secondary | ICD-10-CM | POA: Diagnosis present

## 2022-10-06 DIAGNOSIS — G4700 Insomnia, unspecified: Secondary | ICD-10-CM | POA: Diagnosis present

## 2022-10-06 DIAGNOSIS — F17210 Nicotine dependence, cigarettes, uncomplicated: Secondary | ICD-10-CM | POA: Diagnosis present

## 2022-10-06 DIAGNOSIS — Z801 Family history of malignant neoplasm of trachea, bronchus and lung: Secondary | ICD-10-CM

## 2022-10-06 DIAGNOSIS — E785 Hyperlipidemia, unspecified: Secondary | ICD-10-CM | POA: Diagnosis present

## 2022-10-06 DIAGNOSIS — W19XXXA Unspecified fall, initial encounter: Secondary | ICD-10-CM | POA: Diagnosis present

## 2022-10-06 DIAGNOSIS — I69351 Hemiplegia and hemiparesis following cerebral infarction affecting right dominant side: Secondary | ICD-10-CM | POA: Diagnosis not present

## 2022-10-06 DIAGNOSIS — R519 Headache, unspecified: Secondary | ICD-10-CM | POA: Diagnosis present

## 2022-10-06 DIAGNOSIS — Z794 Long term (current) use of insulin: Secondary | ICD-10-CM | POA: Diagnosis not present

## 2022-10-06 DIAGNOSIS — F332 Major depressive disorder, recurrent severe without psychotic features: Secondary | ICD-10-CM | POA: Diagnosis present

## 2022-10-06 DIAGNOSIS — Z6841 Body Mass Index (BMI) 40.0 and over, adult: Secondary | ICD-10-CM | POA: Diagnosis not present

## 2022-10-06 DIAGNOSIS — K219 Gastro-esophageal reflux disease without esophagitis: Secondary | ICD-10-CM | POA: Diagnosis present

## 2022-10-06 DIAGNOSIS — I428 Other cardiomyopathies: Secondary | ICD-10-CM | POA: Diagnosis present

## 2022-10-06 DIAGNOSIS — I110 Hypertensive heart disease with heart failure: Secondary | ICD-10-CM | POA: Diagnosis present

## 2022-10-06 DIAGNOSIS — Z91148 Patient's other noncompliance with medication regimen for other reason: Secondary | ICD-10-CM

## 2022-10-06 DIAGNOSIS — E119 Type 2 diabetes mellitus without complications: Secondary | ICD-10-CM | POA: Diagnosis present

## 2022-10-06 DIAGNOSIS — Z91040 Latex allergy status: Secondary | ICD-10-CM

## 2022-10-06 DIAGNOSIS — Z5941 Food insecurity: Secondary | ICD-10-CM

## 2022-10-06 DIAGNOSIS — G8911 Acute pain due to trauma: Secondary | ICD-10-CM | POA: Diagnosis present

## 2022-10-06 DIAGNOSIS — Z560 Unemployment, unspecified: Secondary | ICD-10-CM

## 2022-10-06 DIAGNOSIS — Z880 Allergy status to penicillin: Secondary | ICD-10-CM

## 2022-10-06 DIAGNOSIS — I5022 Chronic systolic (congestive) heart failure: Secondary | ICD-10-CM | POA: Diagnosis present

## 2022-10-06 DIAGNOSIS — R45850 Homicidal ideations: Secondary | ICD-10-CM | POA: Diagnosis present

## 2022-10-06 DIAGNOSIS — Z888 Allergy status to other drugs, medicaments and biological substances status: Secondary | ICD-10-CM

## 2022-10-06 DIAGNOSIS — F1490 Cocaine use, unspecified, uncomplicated: Secondary | ICD-10-CM | POA: Diagnosis present

## 2022-10-06 DIAGNOSIS — F4381 Prolonged grief disorder: Secondary | ICD-10-CM | POA: Diagnosis present

## 2022-10-06 DIAGNOSIS — Z91041 Radiographic dye allergy status: Secondary | ICD-10-CM

## 2022-10-06 DIAGNOSIS — R031 Nonspecific low blood-pressure reading: Secondary | ICD-10-CM | POA: Diagnosis not present

## 2022-10-06 LAB — GLUCOSE, CAPILLARY
Glucose-Capillary: 250 mg/dL — ABNORMAL HIGH (ref 70–99)
Glucose-Capillary: 205 mg/dL — ABNORMAL HIGH (ref 70–99)
Glucose-Capillary: 117 mg/dL — ABNORMAL HIGH (ref 70–99)
Glucose-Capillary: 197 mg/dL — ABNORMAL HIGH (ref 70–99)

## 2022-10-06 MED ORDER — HYDROCODONE-ACETAMINOPHEN 5-325 MG PO TABS
2.0000 | ORAL_TABLET | Freq: Four times a day (QID) | ORAL | Status: DC | PRN
  Administered 2022-10-06 – 2022-10-08 (×6): 2 via ORAL
  Filled 2022-10-06 (×6): qty 2

## 2022-10-06 MED ORDER — TRAZODONE HCL 100 MG PO TABS
200.0000 mg | ORAL_TABLET | Freq: Every evening | ORAL | Status: DC | PRN
  Administered 2022-10-06 – 2022-10-13 (×7): 200 mg via ORAL
  Filled 2022-10-06 (×7): qty 2

## 2022-10-06 MED ORDER — TORSEMIDE 20 MG PO TABS
60.0000 mg | ORAL_TABLET | Freq: Every day | ORAL | Status: DC
  Administered 2022-10-07 – 2022-10-17 (×11): 60 mg via ORAL
  Filled 2022-10-06 (×14): qty 3

## 2022-10-06 MED ORDER — INSULIN ASPART PROT & ASPART (70-30 MIX) 100 UNIT/ML ~~LOC~~ SUSP
60.0000 [IU] | Freq: Three times a day (TID) | SUBCUTANEOUS | Status: DC
  Administered 2022-10-06 – 2022-10-12 (×16): 60 [IU] via SUBCUTANEOUS

## 2022-10-06 MED ORDER — ENTRESTO 97-103 MG PO TABS: 1.0000 | ORAL_TABLET | Freq: Two times a day (BID) | ORAL | Status: DC

## 2022-10-06 MED ORDER — POLYVINYL ALCOHOL 1.4 % OP SOLN: 1.0000 [drp] | OPHTHALMIC | Status: DC | PRN

## 2022-10-06 MED ORDER — APIXABAN 5 MG PO TABS
5.0000 mg | ORAL_TABLET | Freq: Two times a day (BID) | ORAL | Status: DC
  Administered 2022-10-06 – 2022-10-07 (×2): 5 mg via ORAL
  Filled 2022-10-06 (×6): qty 1

## 2022-10-06 MED ORDER — SACUBITRIL-VALSARTAN 97-103 MG PO TABS
1.0000 | ORAL_TABLET | Freq: Two times a day (BID) | ORAL | Status: DC
  Administered 2022-10-06 – 2022-10-17 (×20): 1 via ORAL
  Filled 2022-10-06 (×28): qty 1

## 2022-10-06 MED ORDER — DIPHENHYDRAMINE HCL 25 MG PO CAPS
50.0000 mg | ORAL_CAPSULE | Freq: Three times a day (TID) | ORAL | Status: DC | PRN
  Filled 2022-10-06: qty 2

## 2022-10-06 MED ORDER — SPIRONOLACTONE 25 MG PO TABS: 25.0000 mg | ORAL_TABLET | Freq: Every day | ORAL | Status: DC

## 2022-10-06 MED ORDER — POLYVINYL ALCOHOL 1.4 % OP SOLN: 1.0000 [drp] | OPHTHALMIC | 0 refills | Status: DC | PRN

## 2022-10-06 MED ORDER — DIPHENHYDRAMINE HCL 50 MG/ML IJ SOLN: 50.0000 mg | Freq: Three times a day (TID) | INTRAMUSCULAR | Status: DC | PRN

## 2022-10-06 MED ORDER — ROSUVASTATIN CALCIUM 20 MG PO TABS
20.0000 mg | ORAL_TABLET | Freq: Every day | ORAL | Status: DC
  Administered 2022-10-07 – 2022-10-17 (×11): 20 mg via ORAL
  Filled 2022-10-06 (×14): qty 1

## 2022-10-06 MED ORDER — POLYVINYL ALCOHOL 1.4 % OP SOLN
1.0000 [drp] | OPHTHALMIC | Status: DC | PRN
  Administered 2022-10-13: 1 [drp] via OPHTHALMIC
  Filled 2022-10-06: qty 15

## 2022-10-06 MED ORDER — SPIRONOLACTONE 25 MG PO TABS
25.0000 mg | ORAL_TABLET | Freq: Every day | ORAL | Status: DC
  Administered 2022-10-07 – 2022-10-17 (×11): 25 mg via ORAL
  Filled 2022-10-06 (×14): qty 1

## 2022-10-06 MED ORDER — HALOPERIDOL LACTATE 5 MG/ML IJ SOLN: 5.0000 mg | Freq: Three times a day (TID) | INTRAMUSCULAR | Status: DC | PRN

## 2022-10-06 MED ORDER — INSULIN ASPART PROT & ASPART (70-30 MIX) 100 UNIT/ML ~~LOC~~ SUSP: 60.0000 [IU] | Freq: Three times a day (TID) | SUBCUTANEOUS | 11 refills | Status: DC

## 2022-10-06 MED ORDER — POTASSIUM CHLORIDE CRYS ER 20 MEQ PO TBCR
20.0000 meq | EXTENDED_RELEASE_TABLET | Freq: Every day | ORAL | Status: DC
  Administered 2022-10-06: 20 meq via ORAL
  Filled 2022-10-06: qty 1

## 2022-10-06 MED ORDER — HYDROXYZINE HCL 25 MG PO TABS
25.0000 mg | ORAL_TABLET | Freq: Three times a day (TID) | ORAL | Status: DC | PRN
  Administered 2022-10-11 – 2022-10-17 (×7): 25 mg via ORAL
  Filled 2022-10-06 (×7): qty 1

## 2022-10-06 MED ORDER — LORAZEPAM 1 MG PO TABS: 2.0000 mg | ORAL_TABLET | Freq: Three times a day (TID) | ORAL | Status: DC | PRN

## 2022-10-06 MED ORDER — INSULIN GLARGINE-YFGN 100 UNIT/ML ~~LOC~~ SOLN: 20.0000 [IU] | Freq: Every day | SUBCUTANEOUS | 11 refills | Status: DC

## 2022-10-06 MED ORDER — HALOPERIDOL 5 MG PO TABS: 5.0000 mg | ORAL_TABLET | Freq: Three times a day (TID) | ORAL | Status: DC | PRN

## 2022-10-06 MED ORDER — DIGOXIN 125 MCG PO TABS
0.1250 mg | ORAL_TABLET | Freq: Every day | ORAL | Status: DC
  Filled 2022-10-06: qty 1

## 2022-10-06 MED ORDER — CARVEDILOL 3.125 MG PO TABS
3.1250 mg | ORAL_TABLET | Freq: Two times a day (BID) | ORAL | Status: DC
  Administered 2022-10-06 – 2022-10-17 (×21): 3.125 mg via ORAL
  Filled 2022-10-06 (×29): qty 1

## 2022-10-06 MED ORDER — LORAZEPAM 2 MG/ML IJ SOLN: 2.0000 mg | Freq: Three times a day (TID) | INTRAMUSCULAR | Status: DC | PRN

## 2022-10-06 MED ORDER — DIGOXIN 125 MCG PO TABS
0.1250 mg | ORAL_TABLET | Freq: Every day | ORAL | Status: DC
  Administered 2022-10-07 – 2022-10-17 (×11): 0.125 mg via ORAL
  Filled 2022-10-06 (×14): qty 1

## 2022-10-06 MED ORDER — ACETAMINOPHEN 325 MG PO TABS: 650.0000 mg | ORAL_TABLET | Freq: Four times a day (QID) | ORAL | Status: DC | PRN

## 2022-10-06 MED ORDER — POTASSIUM CHLORIDE CRYS ER 20 MEQ PO TBCR
20.0000 meq | EXTENDED_RELEASE_TABLET | Freq: Every day | ORAL | Status: DC
  Administered 2022-10-07 – 2022-10-17 (×11): 20 meq via ORAL
  Filled 2022-10-06 (×16): qty 1

## 2022-10-06 MED ORDER — INSULIN GLARGINE-YFGN 100 UNIT/ML ~~LOC~~ SOLN
20.0000 [IU] | Freq: Every day | SUBCUTANEOUS | Status: DC
  Administered 2022-10-06 – 2022-10-16 (×11): 20 [IU] via SUBCUTANEOUS
  Filled 2022-10-06: qty 0.2

## 2022-10-06 MED ORDER — ALUM & MAG HYDROXIDE-SIMETH 200-200-20 MG/5ML PO SUSP
30.0000 mL | ORAL | Status: DC | PRN
  Administered 2022-10-14 – 2022-10-15 (×2): 30 mL via ORAL
  Filled 2022-10-06 (×2): qty 30

## 2022-10-06 MED ORDER — PRAZOSIN HCL 2 MG PO CAPS
2.0000 mg | ORAL_CAPSULE | Freq: Every day | ORAL | Status: DC
  Administered 2022-10-06 – 2022-10-16 (×10): 2 mg via ORAL
  Filled 2022-10-06 (×3): qty 1
  Filled 2022-10-06: qty 2
  Filled 2022-10-06 (×6): qty 1
  Filled 2022-10-06: qty 2
  Filled 2022-10-06: qty 1
  Filled 2022-10-06: qty 2
  Filled 2022-10-06 (×4): qty 1
  Filled 2022-10-06: qty 2

## 2022-10-06 MED ORDER — DAPAGLIFLOZIN PROPANEDIOL 10 MG PO TABS
10.0000 mg | ORAL_TABLET | Freq: Every day | ORAL | Status: DC
  Administered 2022-10-07 – 2022-10-17 (×11): 10 mg via ORAL
  Filled 2022-10-06 (×14): qty 1

## 2022-10-06 MED ORDER — CARVEDILOL 3.125 MG PO TABS: 3.1250 mg | ORAL_TABLET | Freq: Two times a day (BID) | ORAL | Status: DC

## 2022-10-06 MED ORDER — INSULIN ASPART 100 UNIT/ML IJ SOLN
0.0000 [IU] | Freq: Three times a day (TID) | INTRAMUSCULAR | Status: DC
  Administered 2022-10-07 – 2022-10-08 (×3): 3 [IU] via SUBCUTANEOUS
  Administered 2022-10-08: 2 [IU] via SUBCUTANEOUS
  Administered 2022-10-08: 1 [IU] via SUBCUTANEOUS
  Administered 2022-10-09 (×2): 3 [IU] via SUBCUTANEOUS
  Administered 2022-10-10: 5 [IU] via SUBCUTANEOUS
  Administered 2022-10-10: 2 [IU] via SUBCUTANEOUS
  Administered 2022-10-10: 1 [IU] via SUBCUTANEOUS
  Administered 2022-10-11: 5 [IU] via SUBCUTANEOUS
  Administered 2022-10-11: 1 [IU] via SUBCUTANEOUS
  Administered 2022-10-11: 5 [IU] via SUBCUTANEOUS
  Administered 2022-10-12 – 2022-10-13 (×2): 2 [IU] via SUBCUTANEOUS
  Administered 2022-10-13 – 2022-10-14 (×4): 3 [IU] via SUBCUTANEOUS
  Administered 2022-10-14: 2 [IU] via SUBCUTANEOUS
  Administered 2022-10-15: 1 [IU] via SUBCUTANEOUS
  Administered 2022-10-15 – 2022-10-16 (×3): 5 [IU] via SUBCUTANEOUS
  Administered 2022-10-16: 2 [IU] via SUBCUTANEOUS
  Administered 2022-10-17: 3 [IU] via SUBCUTANEOUS
  Filled 2022-10-06: qty 0.09

## 2022-10-06 MED ORDER — APIXABAN 5 MG PO TABS: 5.0000 mg | ORAL_TABLET | Freq: Two times a day (BID) | ORAL | Status: DC

## 2022-10-06 MED ORDER — MAGNESIUM HYDROXIDE 400 MG/5ML PO SUSP: 30.0000 mL | Freq: Every day | ORAL | Status: DC | PRN

## 2022-10-06 NOTE — Discharge Instructions (Addendum)
Transfer to [facility] For IP admission Dr. Sherron Flemings is the accepting MD

## 2022-10-06 NOTE — ED Notes (Signed)
Pt resting q

## 2022-10-06 NOTE — Progress Notes (Signed)
Left message on voicemail for Luis [name] regarding the need for a correct CPAP order for CPAP QHS (order was entered as DME for home use of unit and supplies). Asked [name] to contact [phone] to confirm correct order has been placed and how the unit / supplies will be picked up from [facility].

## 2022-10-06 NOTE — ED Notes (Signed)
Report given to [facility]

## 2022-10-06 NOTE — Inpatient Diabetes Management (Signed)
Inpatient Diabetes Program Recommendations  AACE/ADA: New Consensus Statement on Inpatient Glycemic Control (2015)  Target Ranges:  Prepandial:   less than 140 mg/dL      Peak postprandial:   less than 180 mg/dL (1-2 hours)      Critically ill patients:  140 - 180 mg/dL   Lab Results  Component Value Date   GLUCAP 197 (H) 10/06/2022   HGBA1C 9.2 (H) 10/05/2022    Review of Glycemic Control  Diabetes history: DM2 Outpatient Diabetes medications: Semglee 20 QHS, Novolog 70/30 60 TID, Farxiga 10 QD Current orders for Inpatient glycemic control: Semglee 20 QHS, Novolog 70/30 60 TID, Novolog 0-9 TID, Farxiga 10 QD  HgbA1C - 9.2%  Inpatient Diabetes Program Recommendations:    Agree with orders (home meds + Novolog sensitive s/s)  [facility]  Continue to follow.  Thank you. Ailene Ards, RD, LDN, CDCES Inpatient Diabetes Coordinator [phone]

## 2022-10-06 NOTE — Progress Notes (Signed)
Received call from [facility])- requesting CPAP unit for this PT tonight. Danika reports that PT uses CPAP at 11 cm with a Large full face mask, with no supplemental oxygen (also currently on RA at this time). PT states that he does not have any CPAP equipment or supplies to utilize during stay at [facility].   Discussed with [name] that [facility] has been given on multiple occasions a list of equipment and supplies to order their own. She plans to follow up to see where things are with ordering.  This RT informed Danika that per Dennison Mascot that  the doctor in charge will be responsible for outcomes with this provided equipment and patient while utilizing CPAP therapy.   [name] will call RT back to inform how the equipment and supplies will be picked up from [facility] and delivered to [facility] for this patient.

## 2022-10-06 NOTE — ED Notes (Signed)
Pt given scrub pants and escorted to sally port.   All of pt's belongings from locker 5 were given to safe transport driver.  Pt left without incident.

## 2022-10-06 NOTE — ED Notes (Signed)
Report given to [facility] RN at [facility].  Verbalized understanding.

## 2022-10-06 NOTE — ED Notes (Signed)
Pt is currently sleeping soundly.  Breathing even and unlabored.

## 2022-10-06 NOTE — ED Notes (Signed)
Pt sleeping at present, no distress noted.  Monitoring for safety. 

## 2022-10-06 NOTE — ED Provider Notes (Signed)
FBC/OBS ASAP Discharge Summary  Date and Time: 10/06/2022 2:46 PM  Name: Luis [name]  MRN:  [id number]   Discharge Diagnoses:  Final diagnoses:  MDD (major depressive disorder), recurrent severe, without psychosis  Cocaine abuse  Suicidal ideation  HPI:  patient presented to [facility] as a walk in accompanied by [employer] after calling through his life alert due to having, "a mental meltdown".  He was admitted to the continuous assessment unit while awaiting inpatient psychiatric bed availability.  [name], 52 y.o., male patient seen face to face by this provider and chart reviewed on 10/06/22. Per chart review patient has a past psychiatric history of MDD, cocaine abuse, and anxiety.  He has a medical history significant for CHF with a pacemaker/defibrillator, diabetes, hypertension, and GERD.  He currently has no outpatient psychiatric services in place.  He is currently unemployed.  He lives in a home with a "girlfriend".  He has 7 children.  Patient reports 3 suicide attempts with 4 past inpatient psychiatric admissions with his last admission being at [facility] 07/2021.  Reports noncompliance with all of his medications.  He currently does not take any psychotropic medications.   UDS on admission positive for cocaine, oxycodone, and marijuana. EtOH is negative   Subjective: On today's reassessment patient is observed laying in the bed asleep.  He is easily awakened but irritable.  Reports he was unable to sleep due to the chair bed, it hurts his back.  He continues to endorse depression and has a depressed affect.  States he is suicidal thoughts are unchanged.  He continues to endorse suicidal ideations with any means possible.  He cannot contract for safety.  He denies AVH/HI.  He does not appear to be responding to internal/external stimuli.  He is denying any withdrawal symptoms.   Stay Summary: Patient continues to meet inpatient psychiatric admission criteria.  He has been  accepted to [facility] for inpatient admission.   Total Time spent with patient: 30 minutes  Past Psychiatric History: as documented in H&P Past Medical History: as documented in H&P Family History: as documented in H&P Family Psychiatric History: as documented in H&P Social History: as documented in H&P Tobacco Cessation:  Prescription not provided because: pt being transferred to [facility] for IP admisison   Current Medications:  No current facility-administered medications for this encounter.   No current outpatient medications on file.   Facility-Administered Medications Ordered in Other Encounters  Medication Dose Route Frequency Provider Last Rate Last Admin   acetaminophen (TYLENOL) tablet 650 mg  650 mg Oral Q6H PRN Ardis Hughs, NP       alum & mag hydroxide-simeth (MAALOX/MYLANTA) 200-200-20 MG/5ML suspension 30 mL  30 mL Oral Q4H PRN Ardis Hughs, NP       apixaban Everlene Balls) tablet 5 mg  5 mg Oral BID Ardis Hughs, NP       carvedilol (COREG) tablet 3.125 mg  3.125 mg Oral BID WC Ardis Hughs, NP       [START ON 10/07/2022] dapagliflozin propanediol (FARXIGA) tablet 10 mg  10 mg Oral Daily Ardis Hughs, NP       [START ON 10/07/2022] digoxin (LANOXIN) tablet 0.125 mg  0.125 mg Oral Daily Ardis Hughs, NP       diphenhydrAMINE (BENADRYL) capsule 50 mg  50 mg Oral TID PRN Ardis Hughs, NP       Or   diphenhydrAMINE (BENADRYL) injection 50 mg  50  mg Intramuscular TID PRN Ardis Hughs, NP       haloperidol (HALDOL) tablet 5 mg  5 mg Oral TID PRN Ardis Hughs, NP       Or   haloperidol lactate (HALDOL) injection 5 mg  5 mg Intramuscular TID PRN Ardis Hughs, NP       HYDROcodone-acetaminophen (NORCO/VICODIN) 5-325 MG per tablet 2 tablet  2 tablet Oral Q6H PRN Ardis Hughs, NP       hydrOXYzine (ATARAX) tablet 25 mg  25 mg Oral TID PRN Ardis Hughs, NP       insulin aspart (novoLOG) injection 0-9 Units  0-9 Units  Subcutaneous TID WC Ardis Hughs, NP       insulin aspart protamine- aspart (NOVOLOG MIX 70/30) injection 60 Units  60 Units Subcutaneous TID with meals Ardis Hughs, NP       insulin glargine-yfgn (SEMGLEE) injection 20 Units  20 Units Subcutaneous QHS Ardis Hughs, NP       LORazepam (ATIVAN) tablet 2 mg  2 mg Oral TID PRN Ardis Hughs, NP       Or   LORazepam (ATIVAN) injection 2 mg  2 mg Intramuscular TID PRN Ardis Hughs, NP       magnesium hydroxide (MILK OF MAGNESIA) suspension 30 mL  30 mL Oral Daily PRN Ardis Hughs, NP       polyvinyl alcohol (LIQUIFILM TEARS) 1.4 % ophthalmic solution 1 drop  1 drop Both Eyes PRN Ardis Hughs, NP       [START ON 10/07/2022] potassium chloride SA (KLOR-CON M) CR tablet 20 mEq  20 mEq Oral Daily Ardis Hughs, NP       prazosin (MINIPRESS) capsule 2 mg  2 mg Oral QHS Ardis Hughs, NP       [START ON 10/07/2022] rosuvastatin (CRESTOR) tablet 20 mg  20 mg Oral Daily Ardis Hughs, NP       sacubitril-valsartan (ENTRESTO) 97-103 mg per tablet  1 tablet Oral BID Ardis Hughs, NP       [START ON 10/07/2022] spironolactone (ALDACTONE) tablet 25 mg  25 mg Oral Daily Ardis Hughs, NP       [START ON 10/07/2022] torsemide (DEMADEX) tablet 60 mg  60 mg Oral Daily Ardis Hughs, NP       traZODone (DESYREL) tablet 200 mg  200 mg Oral QHS PRN Ardis Hughs, NP        PTA Medications:  Facility Ordered Medications  Medication   [COMPLETED] sacubitril-valsartan (ENTRESTO) 49-51 mg per tablet   PTA Medications  Medication Sig   hydrOXYzine (ATARAX) 25 MG tablet Take 1 tablet (25 mg total) by mouth 3 (three) times daily as needed for anxiety.   prazosin (MINIPRESS) 2 MG capsule Take 1 capsule (2 mg total) by mouth at bedtime.   traZODone (DESYREL) 100 MG tablet Take 2 tablets (200 mg total) by mouth at bedtime.   dapagliflozin propanediol (FARXIGA) 10 MG TABS tablet Take 1 tablet (10 mg  total) by mouth daily.   rosuvastatin (CRESTOR) 20 MG tablet Take 1 tablet (20 mg total) by mouth daily.   torsemide (DEMADEX) 20 MG tablet Take 3 tablets (60 mg total) by mouth daily.   HYDROcodone-acetaminophen (NORCO/VICODIN) 5-325 MG tablet Take 2 tablets by mouth every 6 (six) hours as needed for up to 5 days.   potassium chloride SA (KLOR-CON M) 20 MEQ tablet Take 20 mEq by mouth  daily.   carvedilol (COREG) 3.125 MG tablet Take 3.125 mg by mouth 2 (two) times daily with a meal.   digoxin (LANOXIN) 0.125 MG tablet Take 0.125 mg by mouth daily.   apixaban (ELIQUIS) 5 MG TABS tablet Take 1 tablet (5 mg total) by mouth 2 (two) times daily.   insulin aspart protamine- aspart (NOVOLOG MIX 70/30) (70-30) 100 UNIT/ML injection Inject 0.6 mLs (60 Units total) into the skin with breakfast, with lunch, and with evening meal.   insulin glargine-yfgn (SEMGLEE) 100 UNIT/ML injection Inject 0.2 mLs (20 Units total) into the skin at bedtime.   polyvinyl alcohol (LIQUIFILM TEARS) 1.4 % ophthalmic solution Place 1 drop into both eyes as needed for dry eyes.   sacubitril-valsartan (ENTRESTO) 97-103 MG Take 1 tablet by mouth 2 (two) times daily.   [START ON 10/07/2022] spironolactone (ALDACTONE) 25 MG tablet Take 1 tablet (25 mg total) by mouth daily.       03/30/2022    2:11 PM 09/03/2021    1:21 PM 08/05/2021    2:30 PM  Depression screen PHQ 2/9  Decreased Interest 3 1 2   Down, Depressed, Hopeless 3 1 3   PHQ - 2 Score 6 2 5   Altered sleeping  3 1  Tired, decreased energy  3 1  Change in appetite  0 1  Feeling bad or failure about yourself   0 2  Trouble concentrating  0 2  Moving slowly or fidgety/restless  0 1  Suicidal thoughts  0 2  PHQ-9 Score  8 15  Difficult doing work/chores  Extremely dIfficult Very difficult    Flowsheet Row [facility]   C-SSRS RISK CATEGORY Moderate Risk High Risk No Risk       Musculoskeletal  Strength & Muscle Tone: within normal limits Gait & Station: unsteady Patient leans: N/A  Psychiatric Specialty Exam  Presentation  General Appearance:  Disheveled  Eye Contact: Minimal  Speech: Clear and Coherent; Normal Rate  Speech Volume: Normal  Handedness: Right   Mood and Affect  Mood: Depressed; Irritable; Anxious  Affect: Congruent   Thought Process  Thought Processes: Coherent  Descriptions of Associations:Intact  Orientation:Full (Time, Place and Person)  Thought Content:Logical  Diagnosis of Schizophrenia or Schizoaffective disorder in past: No    Hallucinations:Hallucinations: None  Ideas of Reference:Paranoia  Suicidal Thoughts:Suicidal Thoughts: Yes, Active SI Active Intent and/or Plan: With Intent; Without Plan  Homicidal Thoughts:Homicidal Thoughts: No   Sensorium  Memory: Immediate Good; Recent Good; Remote Good  Judgment: Poor  Insight: Poor   Executive Functions  Concentration: Good  Attention Span: Good  Recall: Good  Fund of Knowledge: Good  Language: Good   Psychomotor Activity  Psychomotor Activity: Psychomotor Activity: Normal   Assets  Assets: Communication Skills; Desire for Improvement; Financial Resources/Insurance; Housing   Sleep  Sleep: Sleep: Poor   Nutritional Assessment (For OBS and FBC admissions only) Has the patient had a weight loss or gain of 10 pounds or more in the last 3 months?: Yes Has the patient had a decrease in food intake/or appetite?: Yes Does the patient have dental problems?: No Does the patient have eating habits or behaviors that may be indicators of an eating disorder including binging or inducing vomiting?: No Has the patient recently lost weight without trying?: 0 Has the patient been eating poorly because  of a decreased  appetite?: 0 Malnutrition Screening Tool Score: 0    Physical Exam  Physical Exam Vitals and nursing note reviewed.  Constitutional:      General: He is not in acute distress.    Appearance: Normal appearance. He is well-developed.  HENT:     Head: Normocephalic.  Eyes:     General:        Right eye: No discharge.        Left eye: No discharge.  Cardiovascular:     Rate and Rhythm: Normal rate.  Pulmonary:     Effort: Pulmonary effort is normal. No respiratory distress.  Musculoskeletal:        General: Normal range of motion.     Cervical back: Normal range of motion.  Skin:    Coloration: Skin is not jaundiced or pale.  Neurological:     Mental Status: He is alert and oriented to person, place, and time.  Psychiatric:        Attention and Perception: Perception normal.        Mood and Affect: Mood is anxious and depressed.        Speech: Speech normal.        Behavior: Behavior is agitated. Behavior is cooperative.        Thought Content: Thought content includes suicidal ideation. Thought content includes suicidal plan.        Cognition and Memory: Cognition normal.        Judgment: Judgment normal.    Review of Systems  Constitutional: Negative.   HENT: Negative.    Eyes: Negative.   Respiratory: Negative.    Cardiovascular: Negative.   Musculoskeletal: Negative.   Skin: Negative.   Neurological: Negative.   Psychiatric/Behavioral:  Positive for depression, substance abuse and suicidal ideas.    Blood pressure (!) 114/56, pulse 93, temperature 97.6 F (36.4 C), resp. rate 18, SpO2 97 %. There is no height or weight on file to calculate BMI.      Disposition: Discharge - readmit to [facility] For IP admission   Ardis Hughs, NP 10/06/2022, 2:46 PM

## 2022-10-06 NOTE — ED Notes (Signed)
Pt sitting up in bed eating lunch.  Was given insulin and potassium as ordered.

## 2022-10-06 NOTE — Progress Notes (Signed)
The patient attended group but did not share.  

## 2022-10-06 NOTE — Progress Notes (Signed)
Pt was accepted to [facility] TODAY 10/06/2022, pending updated VS, and signed voluntary consent faxed to [phone]. Bed assignment: 403-2  Pt meets inpatient criteria per Vernard Gambles, NP  Attending Physician will be Phineas Inches, MD  Report can be called to: - Adult unit: [phone]  Pt can arrive after pending items are received; [facility] to coordinate arrival time  Care Team Notified: [facility] Rona Ravens, RN, Malva Limes, RN, Vernard Gambles, NP, Bedelia Person, RN, and Roddie Mc, RN  [place], [place]  10/06/2022 1:11 PM

## 2022-10-07 ENCOUNTER — Encounter (HOSPITAL_COMMUNITY): Payer: Self-pay

## 2022-10-07 LAB — GLUCOSE, CAPILLARY
Glucose-Capillary: 88 mg/dL (ref 70–99)
Glucose-Capillary: 152 mg/dL — ABNORMAL HIGH (ref 70–99)
Glucose-Capillary: 229 mg/dL — ABNORMAL HIGH (ref 70–99)
Glucose-Capillary: 234 mg/dL — ABNORMAL HIGH (ref 70–99)
Glucose-Capillary: 239 mg/dL — ABNORMAL HIGH (ref 70–99)

## 2022-10-07 MED ORDER — DULOXETINE HCL 30 MG PO CPEP
30.0000 mg | ORAL_CAPSULE | Freq: Every day | ORAL | Status: DC
  Administered 2022-10-07 – 2022-10-09 (×3): 30 mg via ORAL
  Filled 2022-10-07 (×6): qty 1
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
## 2022-10-07 NOTE — Progress Notes (Signed)
   10/07/22 2153  Vital Signs  Pulse Rate (!) 108  BP (!) 87/62  BP Location Left Arm  BP Method Automatic  Patient Position (if appropriate) Standing   Hydrated patient with a cup of Gatorade and water.   10/07/22 2234  Vital Signs  Pulse Rate 77  BP (!) 97/47  BP Location Left Arm  BP Method Automatic  Patient Position (if appropriate) Lying   Patient Blood pressure still to low to administer scheduled Prazosin and PRN Trazodone requested by patient.

## 2022-10-07 NOTE — Discharge Instructions (Signed)

## 2022-10-07 NOTE — BH IP Treatment Plan (Signed)
Interdisciplinary Treatment and Diagnostic Plan Update  10/07/2022 Time of Session: 0830 Luis [name] MRN: [id number]  Principal Diagnosis: MDD (major depressive disorder), recurrent severe, without psychosis  Secondary Diagnoses: Principal Problem:   MDD (major depressive disorder), recurrent severe, without psychosis   Current Medications:  Current Facility-Administered Medications  Medication Dose Route Frequency Provider Last Rate Last Admin   acetaminophen (TYLENOL) tablet 650 mg  650 mg Oral Q6H PRN Ardis Hughsoleman, Carolyn H, NP       alum & mag hydroxide-simeth (MAALOX/MYLANTA) 200-200-20 MG/5ML suspension 30 mL  30 mL Oral Q4H PRN Ardis Hughsoleman, Carolyn H, NP       apixaban Everlene Balls(ELIQUIS) tablet 5 mg  5 mg Oral BID Ardis Hughsoleman, Carolyn H, NP   5 mg at 10/07/22 1000   carvedilol (COREG) tablet 3.125 mg  3.125 mg Oral BID WC Ardis Hughsoleman, Carolyn H, NP   3.125 mg at 10/07/22 1000   dapagliflozin propanediol (FARXIGA) tablet 10 mg  10 mg Oral Daily Ardis Hughsoleman, Carolyn H, NP   10 mg at 10/07/22 [id number]   digoxin (LANOXIN) tablet 0.125 mg  0.125 mg Oral Daily Ardis Hughsoleman, Carolyn H, NP   0.125 mg at 10/07/22 0940   diphenhydrAMINE (BENADRYL) capsule 50 mg  50 mg Oral TID PRN Ardis Hughsoleman, Carolyn H, NP       Or   diphenhydrAMINE (BENADRYL) injection 50 mg  50 mg Intramuscular TID PRN Ardis Hughsoleman, Carolyn H, NP       DULoxetine (CYMBALTA) DR capsule 30 mg  30 mg Oral Daily Karie Fetchhien, Stephanie, MD   30 mg at 10/07/22 1254   haloperidol (HALDOL) tablet 5 mg  5 mg Oral TID PRN Ardis Hughsoleman, Carolyn H, NP       Or   haloperidol lactate (HALDOL) injection 5 mg  5 mg Intramuscular TID PRN Ardis Hughsoleman, Carolyn H, NP       HYDROcodone-acetaminophen (NORCO/VICODIN) 5-325 MG per tablet 2 tablet  2 tablet Oral Q6H PRN Ardis Hughsoleman, Carolyn H, NP   2 tablet at 10/07/22 1020   hydrOXYzine (ATARAX) tablet 25 mg  25 mg Oral TID PRN Ardis Hughsoleman, Carolyn H, NP       insulin aspart (novoLOG) injection 0-9 Units  0-9 Units Subcutaneous TID WC Ardis Hughsoleman, Carolyn  H, NP   3 Units at 10/07/22 1256   insulin aspart protamine- aspart (NOVOLOG MIX 70/30) injection 60 Units  60 Units Subcutaneous TID with meals Ardis Hughsoleman, Carolyn H, NP   60 Units at 10/07/22 1254   insulin glargine-yfgn (SEMGLEE) injection 20 Units  20 Units Subcutaneous QHS Ardis Hughsoleman, Carolyn H, NP   20 Units at 10/06/22 2119   LORazepam (ATIVAN) tablet 2 mg  2 mg Oral TID PRN Ardis Hughsoleman, Carolyn H, NP       Or   LORazepam (ATIVAN) injection 2 mg  2 mg Intramuscular TID PRN Ardis Hughsoleman, Carolyn H, NP       magnesium hydroxide (MILK OF MAGNESIA) suspension 30 mL  30 mL Oral Daily PRN Ardis Hughsoleman, Carolyn H, NP       polyvinyl alcohol (LIQUIFILM TEARS) 1.4 % ophthalmic solution 1 drop  1 drop Both Eyes PRN Ardis Hughsoleman, Carolyn H, NP       potassium chloride SA (KLOR-CON M) CR tablet 20 mEq  20 mEq Oral Daily Vernard Gamblesoleman, Carolyn H, NP   20 mEq at 10/07/22 0941   prazosin (MINIPRESS) capsule 2 mg  2 mg Oral QHS Ardis Hughsoleman, Carolyn H, NP   2 mg at 10/06/22 2116   rosuvastatin (CRESTOR) tablet 20 mg  20 mg Oral  Daily Ardis Hughs, NP   20 mg at 10/07/22 2229   sacubitril-valsartan (ENTRESTO) 97-103 mg per tablet  1 tablet Oral BID Ardis Hughs, NP   1 tablet at 10/07/22 7989   spironolactone (ALDACTONE) tablet 25 mg  25 mg Oral Daily Ardis Hughs, NP   25 mg at 10/07/22 2119   torsemide (DEMADEX) tablet 60 mg  60 mg Oral Daily Ardis Hughs, NP   60 mg at 10/07/22 4174   traZODone (DESYREL) tablet 200 mg  200 mg Oral QHS PRN Ardis Hughs, NP   200 mg at 10/06/22 2116   PTA Medications: Medications Prior to Admission  Medication Sig Dispense Refill Last Dose   apixaban (ELIQUIS) 5 MG TABS tablet Take 1 tablet (5 mg total) by mouth 2 (two) times daily. 60 tablet     carvedilol (COREG) 3.125 MG tablet Take 3.125 mg by mouth 2 (two) times daily with a meal.      dapagliflozin propanediol (FARXIGA) 10 MG TABS tablet Take 1 tablet (10 mg total) by mouth daily. 90 tablet 2    digoxin (LANOXIN) 0.125  MG tablet Take 0.125 mg by mouth daily.      HYDROcodone-acetaminophen (NORCO/VICODIN) 5-325 MG tablet Take 2 tablets by mouth every 6 (six) hours as needed for up to 5 days. 40 tablet 0    hydrOXYzine (ATARAX) 25 MG tablet Take 1 tablet (25 mg total) by mouth 3 (three) times daily as needed for anxiety. 75 tablet 1    insulin aspart protamine- aspart (NOVOLOG MIX 70/30) (70-30) 100 UNIT/ML injection Inject 0.6 mLs (60 Units total) into the skin with breakfast, with lunch, and with evening meal. 10 mL 11    insulin glargine-yfgn (SEMGLEE) 100 UNIT/ML injection Inject 0.2 mLs (20 Units total) into the skin at bedtime. 10 mL 11    polyvinyl alcohol (LIQUIFILM TEARS) 1.4 % ophthalmic solution Place 1 drop into both eyes as needed for dry eyes. 15 mL 0    potassium chloride SA (KLOR-CON M) 20 MEQ tablet Take 20 mEq by mouth daily.      prazosin (MINIPRESS) 2 MG capsule Take 1 capsule (2 mg total) by mouth at bedtime. 30 capsule 1    rosuvastatin (CRESTOR) 20 MG tablet Take 1 tablet (20 mg total) by mouth daily. 90 tablet 2    sacubitril-valsartan (ENTRESTO) 97-103 MG Take 1 tablet by mouth 2 (two) times daily. 60 tablet     spironolactone (ALDACTONE) 25 MG tablet Take 1 tablet (25 mg total) by mouth daily.      torsemide (DEMADEX) 20 MG tablet Take 3 tablets (60 mg total) by mouth daily. 270 tablet 2    traZODone (DESYREL) 100 MG tablet Take 2 tablets (200 mg total) by mouth at bedtime. 28 tablet 0     Patient Stressors:    Patient Strengths:    Treatment Modalities: Medication Management, Group therapy, Case management,  1 to 1 session with clinician, Psychoeducation, Recreational therapy.   Physician Treatment Plan for Primary Diagnosis: MDD (major depressive disorder), recurrent severe, without psychosis Long Term Goal(s):     Short Term Goals:    Medication Management: Evaluate patient's response, side effects, and tolerance of medication regimen.  Therapeutic Interventions: 1 to 1  sessions, Unit Group sessions and Medication administration.  Evaluation of Outcomes: Progressing  Physician Treatment Plan for Secondary Diagnosis: Principal Problem:   MDD (major depressive disorder), recurrent severe, without psychosis  Long Term Goal(s):     Short  Term Goals:       Medication Management: Evaluate patient's response, side effects, and tolerance of medication regimen.  Therapeutic Interventions: 1 to 1 sessions, Unit Group sessions and Medication administration.  Evaluation of Outcomes: Progressing   RN Treatment Plan for Primary Diagnosis: MDD (major depressive disorder), recurrent severe, without psychosis Long Term Goal(s): Knowledge of disease and therapeutic regimen to maintain health will improve  Short Term Goals: Ability to remain free from injury will improve, Ability to verbalize frustration and anger appropriately will improve, Ability to demonstrate self-control, Ability to participate in decision making will improve, Ability to verbalize feelings will improve, Ability to disclose and discuss suicidal ideas, Ability to identify and develop effective coping behaviors will improve, and Compliance with prescribed medications will improve  Medication Management: RN will administer medications as ordered by provider, will assess and evaluate patient's response and provide education to patient for prescribed medication. RN will report any adverse and/or side effects to prescribing provider.  Therapeutic Interventions: 1 on 1 counseling sessions, Psychoeducation, Medication administration, Evaluate responses to treatment, Monitor vital signs and CBGs as ordered, Perform/monitor CIWA, COWS, AIMS and Fall Risk screenings as ordered, Perform wound care treatments as ordered.  Evaluation of Outcomes: Progressing   LCSW Treatment Plan for Primary Diagnosis: MDD (major depressive disorder), recurrent severe, without psychosis Long Term Goal(s): Safe transition to  appropriate next level of care at discharge, Engage patient in therapeutic group addressing interpersonal concerns.  Short Term Goals: Engage patient in aftercare planning with referrals and resources, Increase social support, Increase ability to appropriately verbalize feelings, Increase emotional regulation, Facilitate acceptance of mental health diagnosis and concerns, Facilitate patient progression through stages of change regarding substance use diagnoses and concerns, Identify triggers associated with mental health/substance abuse issues, and Increase skills for wellness and recovery  Therapeutic Interventions: Assess for all discharge needs, 1 to 1 time with Social worker, Explore available resources and support systems, Assess for adequacy in community support network, Educate family and significant other(s) on suicide prevention, Complete Psychosocial Assessment, Interpersonal group therapy.  Evaluation of Outcomes: Progressing   Progress in Treatment: Attending groups: No. Participating in groups: No. Taking medication as prescribed: Yes. Toleration medication: Yes. Family/Significant other contact made: No, will contact:  CSW to obtain consent to reach family/friend.  Patient understands diagnosis: Yes. Discussing patient identified problems/goals with staff: Yes. Medical problems stabilized or resolved: Yes. Denies suicidal/homicidal ideation: No. Issues/concerns per patient self-inventory: Yes. Other: none  New problem(s) identified: No, Describe:  none  New Short Term/Long Term Goal(s): Patient to work towards detox, medication management for mood stabilization; elimination of SI thoughts; development of comprehensive mental wellness/sobriety plan.  Patient Goals:  Patient states their goal for treatment is to "go to the [place]."  Discharge Plan or Barriers: No psychosocial barriers identified at this time, patient to return to place of residence when appropriate for discharge.    Reason for Continuation of Hospitalization: Depression Medication stabilization  Estimated Length of Stay: 1-7 days   Last 3 [place] Suicide Severity Risk Score: Flowsheet Row Admission (Current) [facility] from 10/05/2022 in [facility] ED from 10/03/2022 in [facility] Department at [facility]  C-SSRS RISK CATEGORY Moderate Risk High Risk No Risk       Last PHQ 2/9 Scores:    03/30/2022    2:11 PM 09/03/2021    1:21 PM 08/05/2021    2:30 PM  Depression screen PHQ 2/9  Decreased Interest 3 1 2   Down, Depressed, Hopeless 3 1 3   PHQ - 2 Score 6 2 5   Altered sleeping  3 1  Tired, decreased energy  3 1  Change in appetite  0 1  Feeling bad or failure about yourself   0 2  Trouble concentrating  0 2  Moving slowly or fidgety/restless  0 1  Suicidal thoughts  0 2  PHQ-9 Score  8 15  Difficult doing work/chores  Extremely dIfficult Very difficult    Scribe for Treatment Team: Almedia Balls 10/07/2022 2:48 PM

## 2022-10-07 NOTE — Progress Notes (Signed)
NUTRITION NOTE  RD consulted for diabetes diet education.  INTERVENTION: 1. Provided "Carbohydrate Counting" handout in AVS 2. Will place outpatient referral to [facility].  NUTRITION DIAGNOSIS: Unintentional weight loss related to sub-optimal intake as evidenced by pt report.   Goal: Pt to meet >/= 90% of their estimated nutrition needs.  Monitor:  PO intake  Assessment:  Patient admitted with depression, substance abuse and SI. UDS+ for cocaine.  Patient reports not eating for 3 days PTA. Pt with history of diabetes, last HgbA1c: 9.2. Provided Carbohydrate counting handout in AVS for patient reference. Will place outpatient referral to [facility] for diabetes education and management.   Height: Ht Readings from Last 1 Encounters:  10/06/22 5' 5.5" (1.664 m)    Weight: Wt Readings from Last 1 Encounters:  10/06/22 130.2 kg    Weight Hx: Wt Readings from Last 10 Encounters:  10/06/22 130.2 kg  10/03/22 132 kg  07/13/22 132.5 kg  05/25/22 135.3 kg  04/29/22 132.9 kg  04/23/22 128.7 kg  04/14/22 (!) 145.2 kg  04/07/22 135.2 kg  04/07/22 135.5 kg  03/30/22 (!) 136.4 kg    BMI:  Body mass index is 47.03 kg/m. Pt meets criteria for morbid obesity based on current BMI.  Estimated Nutritional Needs: Kcal: 25-30 kcal/kg Protein: > 1 gram protein/kg Fluid: 1 ml/kcal  Diet Order:  Diet Order             Diet regular Room service appropriate? Yes; Fluid consistency: Thin  Diet effective now                  Pt is also offered choice of unit snacks mid-morning and mid-afternoon.  Pt is eating as desired.   Lab results and medications reviewed.   Tilda Franco, MS, RD, LDN Inpatient Clinical Dietitian Contact information available via Amion

## 2022-10-07 NOTE — Group Note (Unsigned)
Date:  10/07/2022 Time:  12:26 PM  Group Topic/Focus:  Orientation:   The focus of this group is to educate the patient on the purpose and policies of crisis stabilization and provide a format to answer questions about their admission.  The group details unit policies and expectations of patients while admitted.     Participation Level:  {BHH PARTICIPATION LEVEL:22264}  Participation Quality:  {BHH PARTICIPATION QUALITY:22265}  Affect:  {BHH AFFECT:22266}  Cognitive:  {BHH COGNITIVE:22267}  Insight: {BHH Insight2:20797}  Engagement in Group:  {BHH ENGAGEMENT IN GROUP:22268}  Modes of Intervention:  {BHH MODES OF INTERVENTION:22269}  Additional Comments:  ***  Paden Senger D Ravi Tuccillo 10/07/2022, 12:26 PM  

## 2022-10-07 NOTE — Progress Notes (Signed)
   10/07/22 0647  15 Minute Checks  Location Dayroom  Visual Appearance Calm  Behavior Composed  Sleep (Behavioral Health Patients Only)  Calculate sleep? (Click Yes once per 24 hr at 0600 safety check) Yes  Documented sleep last 24 hours 7.5

## 2022-10-07 NOTE — Group Note (Signed)
Recreation Therapy Group Note   Group Topic:Team Building  Group Date: 10/07/2022 Start Time: 0931 End Time: 1005 Facilitators: Aldred Mase-McCall, LRT,CTRS Location: [address] Dayroom   Goal Area(s) Addresses:  Patient will effectively work with peer towards shared goal.  Patient will identify skills used to make activity successful.  Patient will identify how skills used during activity can be applied to reach post d/c goals.   Group Description: [employer]. In teams of 5-6, patients were given 11 craft pipe cleaners. Using the materials provided, patients were instructed to compete again the opposing team(s) to build the tallest free-standing structure from floor level. The activity was timed; difficulty increased by Clinical research associate as Production designer, theatre/television/film continued.  Systematically resources were removed with additional directions for example, placing one arm behind their back, working in silence, and shape stipulations. LRT facilitated post-activity discussion reviewing team processes and necessary communication skills involved in completion. Patients were encouraged to reflect how the skills utilized, or not utilized, in this activity can be incorporated to positively impact support systems post discharge.   Affect/Mood: N/A   Participation Level: Did not attend    Clinical Observations/Individualized Feedback:     Plan: Continue to engage patient in RT group sessions 2-3x/week.   Ayodeji Keimig-McCall, LRT,CTRS 10/07/2022 2:54 PM

## 2022-10-07 NOTE — Group Note (Unsigned)
Date:  10/07/2022 Time:  12:30 PM  Group Topic/Focus:  Orientation:   The focus of this group is to educate the patient on the purpose and policies of crisis stabilization and provide a format to answer questions about their admission.  The group details unit policies and expectations of patients while admitted.     Participation Level:  {BHH PARTICIPATION LEVEL:22264}  Participation Quality:  {BHH PARTICIPATION QUALITY:22265}  Affect:  {BHH AFFECT:22266}  Cognitive:  {BHH COGNITIVE:22267}  Insight: {BHH Insight2:20797}  Engagement in Group:  {BHH ENGAGEMENT IN GROUP:22268}  Modes of Intervention:  {BHH MODES OF INTERVENTION:22269}  Additional Comments:  ***  Luis [name] D [name] 10/07/2022, 12:30 PM  

## 2022-10-07 NOTE — Progress Notes (Signed)
   10/07/22 0400  Psych Admission Type (Psych Patients Only)  Admission Status Voluntary  Psychosocial Assessment  Patient Complaints Depression  Eye Contact Brief  Facial Expression Flat  Affect Depressed  Speech Slow;Logical/coherent  Interaction Assertive  Motor Activity Slow  Appearance/Hygiene Unremarkable  Behavior Characteristics Cooperative;Appropriate to situation  Mood Depressed  Thought Process  Coherency WDL  Content WDL  Delusions None reported or observed  Perception WDL  Hallucination None reported or observed  Judgment Impaired  Confusion None  Danger to Self  Current suicidal ideation? Passive  Self-Injurious Behavior No self-injurious ideation or behavior indicators observed or expressed   Agreement Not to Harm Self Yes  Description of Agreement Verbal  Danger to Others  Danger to Others None reported or observed

## 2022-10-07 NOTE — Group Note (Signed)
Date:  10/07/2022 Time:  9:38 AM  Group Topic/Focus:  Orientation:   The focus of this group is to educate the patient on the purpose and policies of crisis stabilization and provide a format to answer questions about their admission.  The group details unit policies and expectations of patients while admitted.    Participation Level:  Did Not Attend  Participation Quality:  Appropriate and    Affect:      Cognitive:      Insight: None  Engagement in Group:  Defensive and    Modes of Intervention:      Additional Comments:     Reymundo Poll 10/07/2022, 9:38 AM
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
## 2022-10-07 NOTE — Plan of Care (Signed)
Nurse discussed anxiety, depression and coping skills with patient.  

## 2022-10-07 NOTE — [facility] Counselor (Signed)
Adult Comprehensive Assessment  Patient ID: ZETHAN KEAYS, male   DOB: [birth year], 52 y.o.   MRN: [id number]  Information Source: Information source: Patient  Current Stressors:  Patient states their primary concerns and needs for treatment are:: " my stinking thinking and having SI " Patient states their goals for this hospitilization and ongoing recovery are:: Pt did not say Educational / Learning stressors: " No " Employment / Job issues: " No" Family Relationships: " sister is the dealer where he get his crack from and pt states that he is tired of dealing with her and allowing her to use his car. Pt exact words , " I just want to bust a cap in her ass" Financial / Lack of resources (include bankruptcy): " No" Housing / Lack of housing: " No" Physical health (include injuries & life threatening diseases): "No" Social relationships: " me and relationship , I am always trying to make a woman happy and I be miserable, the woman I have now she always out late and I just don't trust her " Substance abuse: " cocaine " Bereavement / Loss: " Anniversary of my wife who been deceased for 3 years in 11/14/2022 "  Living/Environment/Situation:  Living Arrangements: Alone Living conditions (as described by patient or guardian): home Who else lives in the home?: Ex-girlfriend was there How long has patient lived in current situation?: " 3 years " What is atmosphere in current home: Comfortable, Paramedic  Family History:  Marital status: Single Are you sexually active?: No What is your sexual orientation?: " straight " Has your sexual activity been affected by drugs, alcohol, medication, or emotional stress?: Pt denies Does patient have children?: Yes How many children?: 7 How is patient's relationship with their children?: " I have 2 set of twins ( daughters-35 / sons-34), daughter 56, son 10 years ; patient states that his other son in 47 was hit by a car "  Childhood History:  By whom  was/is the patient raised?: Mother, Grandparents Additional childhood history information: " I was spoon fed my entire life " Description of patient's relationship with caregiver when they were a child: " great " Patient's description of current relationship with people who raised him/her: " Mom - not the best , my grandmothers are deceased " How were you disciplined when you got in trouble as a child/adolescent?: " what was that " Does patient have siblings?: Yes Number of Siblings: 2 Description of patient's current relationship with siblings: " I don't bother them " Did patient suffer any verbal/emotional/physical/sexual abuse as a child?: No Did patient suffer from severe childhood neglect?: No Has patient ever been sexually abused/assaulted/raped as an adolescent or adult?: No Was the patient ever a victim of a crime or a disaster?: No Witnessed domestic violence?: Yes Has patient been affected by domestic violence as an adult?: No Description of domestic violence: "I didn't know the people"  Education:  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 Type  of Psychiatric Treatment/Services in Military: PTSD and seen doctors  Financial Resources:   Financial resources: Laverda Page, Receives SSI Does patient have a Lawyer or guardian?: No  Alcohol/Substance Abuse:   What has been your use of  drugs/alcohol within the last 12 months?: " cocaine " If attempted suicide, did drugs/alcohol play a role in this?: Yes Alcohol/Substance Abuse Treatment Hx: Past Tx, Inpatient If yes, describe treatment: [place] [facility] Has alcohol/substance abuse ever caused legal problems?: No  Social Support System:   Conservation officer, nature Support System: Fair Museum/gallery exhibitions officer System: Neighbors , church, and line brothers Type of faith/religion: [employer] How does patient's faith help to cope with current illness?: Technical brewer:   Do You Have Hobbies?: Yes Leisure and Hobbies: "Trainning dogs and modeling cars "  Strengths/Needs:   What is the patient's perception of their strengths?: " I am not a follower, a leader " Patient states they can use these personal strengths during their treatment to contribute to their recovery: " I like to be left alone " Patient states these barriers may affect/interfere with their treatment: " None " Patient states these barriers may affect their return to the community: " I just want to be with my wife who been their for me for 30 something years " Other important information patient would like considered in planning for their treatment: " N/A  Discharge Plan:   Currently receiving community mental health services: Yes (From Whom) ([facility]) Patient states concerns and preferences for aftercare planning are: Pt wants some substance services through the [place] Patient states they will know when they are safe and ready for discharge when: Pt states that he just want to be with his wife Does patient have access to transportation?: No Does patient have financial barriers related to discharge medications?: No Plan for no access to transportation at discharge: CSW will arrange a taxi if sister has his car Will patient be returning to same living situation after discharge?: Yes  Summary/Recommendations:   Summary and Recommendations (to  be completed by the evaluator): Lannis Heimbuch is a 52 y/o male who was admitted to the hosptial per his report for having SI and " stinking thinking". Tyaire states that he is tired of dealing with women who are not good for him nor show love the way his wife did. Taro wife of 30 something years passed in 05-Dec-2019 which caused his Mental Health to be affected along with him being in the [employer]. His current stressors are his sister who deals him the cocaine , social relationships with a woman he been dating for 4 months, and the anniversary of his wife death which 2022-12-05 makes it 3 years she has been gone. Kiley states that this is not his first hospitalization and that he has a psychiatric history of MDD, PTSD, GAD, Cocaine abuse, and unspecified mood disorder etc. Patricia states that he is [facility] and receives all his services through them,. Rowley does use cocaine but sates that it is not daily nor is it a problem to where he needs to go to a residential for it. 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 Patient is open to having something inpatient to help with his substance use that the [place]  covers.While here, Jolayne Haines can benefit from crisis stabilization, medication management, therapeutic milieu, and referrals for services.   Alarik Radu B  Dorthy Hustead. 10/07/2022

## 2022-10-07 NOTE — Progress Notes (Signed)
Patient told nurse this morning that he did not want to take any meds.

## 2022-10-08 LAB — GLUCOSE, CAPILLARY
Glucose-Capillary: 132 mg/dL — ABNORMAL HIGH (ref 70–99)
Glucose-Capillary: 179 mg/dL — ABNORMAL HIGH (ref 70–99)
Glucose-Capillary: 229 mg/dL — ABNORMAL HIGH (ref 70–99)
Glucose-Capillary: 170 mg/dL — ABNORMAL HIGH (ref 70–99)
Glucose-Capillary: 236 mg/dL — ABNORMAL HIGH (ref 70–99)

## 2022-10-08 MED ORDER — HYDROCODONE-ACETAMINOPHEN 5-325 MG PO TABS
1.0000 | ORAL_TABLET | Freq: Four times a day (QID) | ORAL | Status: AC | PRN
  Administered 2022-10-08 – 2022-10-09 (×3): 1 via ORAL
  Filled 2022-10-08 (×3): qty 1

## 2022-10-08 MED ORDER — GABAPENTIN 300 MG PO CAPS
300.0000 mg | ORAL_CAPSULE | Freq: Every day | ORAL | Status: DC
  Administered 2022-10-08 – 2022-10-10 (×3): 300 mg via ORAL
  Filled 2022-10-08 (×4): qty 1

## 2022-10-08 MED ORDER — NAPHAZOLINE-GLYCERIN 0.012-0.25 % OP SOLN
1.0000 [drp] | Freq: Four times a day (QID) | OPHTHALMIC | Status: DC | PRN
  Administered 2022-10-08 (×2): 2 [drp] via OPHTHALMIC
  Administered 2022-10-09: 1 [drp] via OPHTHALMIC
  Administered 2022-10-09: 2 [drp] via OPHTHALMIC
  Filled 2022-10-08: qty 15

## 2022-10-08 NOTE — Progress Notes (Addendum)
    10/07/22 2151  Psych Admission Type (Psych Patients Only)  Admission Status Voluntary  Psychosocial Assessment  Patient Complaints Depression  Eye Contact Brief  Facial Expression Flat  Affect Depressed  Speech Soft;Logical/coherent  Interaction Assertive  Motor Activity Slow  Appearance/Hygiene Unremarkable  Behavior Characteristics Cooperative;Appropriate to situation  Mood Depressed;Anxious  Thought Process  Coherency WDL  Content WDL  Delusions None reported or observed  Perception WDL  Hallucination None reported or observed  Judgment Poor  Confusion None  Danger to Self  Current suicidal ideation? Denies  Self-Injurious Behavior No self-injurious ideation or behavior indicators observed or expressed   Agreement Not to Harm Self Yes  Description of Agreement verbal  Danger to Others  Danger to Others None reported or observed   On assessment, patient was observed in his room laying in bed connected to his CPAP.  Patient isolates to room, but comes out for medications and a brief time in the day room.  Patient denies SI/HI/AVH and contracts for safety.  Patient is safe on the unit with q15 minute safety checks.

## 2022-10-08 NOTE — Group Note (Signed)
Date:  10/08/2022 Time:  1:24 PM  Group Topic/Focus:  Emotional Education:   The focus of this group is to discuss what feelings/emotions are, and how they are experienced.    Participation Level:  Did Not Attend  Participation Quality:    Affect:    Cognitive:    Insight:   Engagement in Group:    Modes of Intervention:    Additional Comments:    Jaquita Rector 10/08/2022, 1:24 PM

## 2022-10-08 NOTE — Group Note (Signed)
Date:  10/08/2022 Time:  9:15 AM  Group Topic/Focus:  Goals Group:   The focus of this group is to help patients establish daily goals to achieve during treatment and discuss how the patient can incorporate goal setting into their daily lives to aide in recovery. Orientation:   The focus of this group is to educate the patient on the purpose and policies of crisis stabilization and provide a format to answer questions about their admission.  The group details unit policies and expectations of patients while admitted.    Participation Level:  Did Not Attend  Participation Quality:    Affect:    Cognitive:    Insight:   Engagement in Group:    Modes of Intervention:    Additional Comments:    Jaquita Rector 10/08/2022, 9:15 AM

## 2022-10-08 NOTE — Group Note (Signed)
Date:  10/08/2022 Time:  1:28 PM  Group Topic/Focus:  Coping With Mental Health Crisis:   The purpose of this group is to help patients identify strategies for coping with mental health crisis.  Group discusses possible causes of crisis and ways to manage them effectively.    Participation Level:  Minimal  Participation Quality:  Appropriate  Affect:  Appropriate  Cognitive:  Appropriate  Insight: Appropriate  Engagement in Group:  Engaged  Modes of Intervention:  Discussion, Rapport Building, and Support  Additional Comments:    Memory Dance Luis [name] 10/08/2022, 1:28 PM

## 2022-10-08 NOTE — Progress Notes (Signed)
  Patient presented with low back pain 10/10.  Administered PRN Hydrocodone-acetaminophen per [facility] per patient request.

## 2022-10-08 NOTE — Evaluation (Signed)
Occupational Therapy Evaluation Patient Details Name: Luis [name] MRN: [id number] DOB: [birth year] Today's Date: 10/08/2022   History of Present Illness per H&P:Luis [name] is a 52 y.o., male with a past psychiatric history significant for recurrent, severe MDD, PTSD, and substance use disorder who presents to the [facility] voluntarily from [facility] for evaluation and management of Severe MDD with SI.   Clinical Impression   Pt is met in dayroom where he is found to be resting in chair. Pt agrees to OT evaluation. Pt reports he has access to personal aide at home 24/7 who has been assisting w/ most all bADLs PRN. Has baseline imbalance where he uses a rollator for ambulation. Reports 3 mechanical falls YTD. 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 OT issued RW on this date and pt demos previous understanding of implementation and use and reports RW immediately improves balance and confidence in ambulation on this date. Pt does report increased difficulty w/ LB tasks involving bending and reaching beyond his normal baseline.  OT will follow in order to introduce AE and education on improving deficits in this domain of ADLs. POC has been communicated w/ PT.      Recommendations for follow up therapy are one component of a multi-disciplinary discharge planning process, led by the attending physician.  Recommendations may be updated based on patient status, additional functional criteria and insurance authorization.   Assistance Recommended at Discharge Other (comment) (will cont to have acess to aide s/p DC. aide assists w/ bADLs and LB dressing at home.)  Patient can return home with the following A lot of help with bathing/dressing/bathroom    Functional Status Assessment  Patient has had a recent decline in their functional status and demonstrates the ability to make significant  improvements in function in a reasonable and predictable amount of time.  Equipment Recommendations       Recommendations for Other Services  Pending Tx outcomes in this acute setting     Precautions / Restrictions Precautions Precautions: Fall Restrictions Weight Bearing Restrictions: No      Mobility Bed Mobility Overal bed mobility: Independent                  Transfers Overall transfer level: Modified independent Equipment used: Rolling walker (2 wheels)                      Balance Overall balance assessment: History of Falls                                         ADL either performed or assessed with clinical judgement   ADL Overall ADL's : Needs assistance/impaired             Lower Body Bathing: Moderate assistance Lower Body Bathing Details (indicate cue type and reason): increased difficulty w/ LB tasks d/t bending and reaching difficulties                              Pertinent Vitals/Pain Pain Assessment Pain Assessment: No/denies pain     Hand Dominance Right   Extremity/Trunk Assessment             Communication Communication Communication: No difficulties   Cognition Arousal/Alertness: Awake/alert Behavior During Therapy: [facility]  for tasks assessed/performed Overall Cognitive Status: Within Functional Limits for tasks assessed                                       General Comments  balance is mildly impaired, however at baseline and is improved w/ the introduction of RW x OT during this session               Home Living Family/patient expects to be discharged to:: Private residence Living Arrangements: Alone   Type of Home: Apartment Home Access: Level entry     Home Layout: One level     Bathroom Shower/Tub: Producer, television/film/video: Standard     Home Equipment: Adaptive equipment;Rolling Environmental consultant (2 wheels) Adaptive Equipment: Sock aid         Prior Functioning/Environment                 ADLs Comments: difficulty performing ADLs like lower body dressing, cooking, cleaning, bathing (has AE at home / has aide at home)        OT Problem List: Impaired balance (sitting and/or standing)      OT Treatment/Interventions:      OT Goals(Current goals can be found in the care plan section) Acute Rehab OT Goals Patient Stated Goal: improve LB ADLs w/ AE as able OT Goal Formulation: With patient Time For Goal Achievement: 10/15/22 Potential to Achieve Goals: Good   AM-PAC OT "6 Clicks" Daily Activity     Outcome Measure Help from another person eating meals?: None Help from another person taking care of personal grooming?: None Help from another person toileting, which includes using toliet, bedpan, or urinal?: None Help from another person bathing (including washing, rinsing, drying)?: None Help from another person to put on and taking off regular upper body clothing?: None Help from another person to put on and taking off regular lower body clothing?: A Little 6 Click Score: 23   End of Session Equipment Utilized During Treatment: Rolling walker (2 wheels) Nurse Communication: Mobility status  Activity Tolerance: Patient tolerated treatment well Patient left: in chair  OT Visit Diagnosis: Other abnormalities of gait and mobility (R26.89)                Time: 1430-1510 OT Time Calculation (min): 40 min Charges:  OT General Charges $OT Visit: 1 Visit OT Evaluation $OT Eval Low Complexity: 1 Low OT Treatments $Self Care/Home Management : 23-37 mins  Kerrin Champagne, OT   Ted Mcalpine 10/08/2022, 7:48 PM
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
## 2022-10-08 NOTE — Progress Notes (Signed)
   10/08/22 0555  15 Minute Checks  Location Bedroom  Visual Appearance Calm  Behavior Sleeping  Sleep (Behavioral Health Patients Only)  Calculate sleep? (Click Yes once per 24 hr at 0600 safety check) Yes  Documented sleep last 24 hours 8

## 2022-10-08 NOTE — Plan of Care (Signed)
OT to follow pt in order to address deficits in LB ADLs.   Kerrin Champagne, OT

## 2022-10-08 NOTE — Progress Notes (Signed)
PT Note  Patient Details Name: Luis [name] MRN: [id number] DOB: 1971/05/19   Cancelled Treatment:    PT order received. Our OT who covers BH will see and assess pt. If any further PT needs, he will let us know. If he can address all issues then PT will sign off.   Thank you     Marella Bile 10/08/2022, 5:55 PM

## 2022-10-08 NOTE — Plan of Care (Signed)
  Problem: Coping: Goal: Ability to adjust to condition or change in health will improve Outcome: Progressing   Problem: Nutritional: Goal: Maintenance of adequate nutrition will improve Outcome: Progressing   

## 2022-10-09 LAB — GLUCOSE, CAPILLARY
Glucose-Capillary: 84 mg/dL (ref 70–99)
Glucose-Capillary: 222 mg/dL — ABNORMAL HIGH (ref 70–99)
Glucose-Capillary: 221 mg/dL — ABNORMAL HIGH (ref 70–99)
Glucose-Capillary: 182 mg/dL — ABNORMAL HIGH (ref 70–99)

## 2022-10-09 MED ORDER — DULOXETINE HCL 60 MG PO CPEP
60.0000 mg | ORAL_CAPSULE | Freq: Every day | ORAL | Status: DC
  Administered 2022-10-10 – 2022-10-13 (×4): 60 mg via ORAL
  Filled 2022-10-09 (×5): qty 1

## 2022-10-09 MED ORDER — DULOXETINE HCL 20 MG PO CPEP
20.0000 mg | ORAL_CAPSULE | Freq: Every day | ORAL | Status: AC
  Administered 2022-10-09: 20 mg via ORAL
  Filled 2022-10-09: qty 1

## 2022-10-09 MED ORDER — GATIFLOXACIN 0.5 % OP SOLN
1.0000 [drp] | Freq: Four times a day (QID) | OPHTHALMIC | Status: DC
  Administered 2022-10-09 – 2022-10-17 (×30): 1 [drp] via OPHTHALMIC
  Filled 2022-10-09 (×2): qty 2.5

## 2022-10-09 MED ORDER — PREDNISOLONE ACETATE 1 % OP SUSP
1.0000 [drp] | Freq: Two times a day (BID) | OPHTHALMIC | Status: DC
  Administered 2022-10-09 – 2022-10-16 (×14): 1 [drp] via OPHTHALMIC
  Filled 2022-10-09: qty 5
  Filled 2022-10-09: qty 1

## 2022-10-09 NOTE — Progress Notes (Signed)
   10/08/22 2104  Psych Admission Type (Psych Patients Only)  Admission Status Voluntary  Psychosocial Assessment  Patient Complaints Anxiety;Depression  Eye Contact Brief  Facial Expression Sad  Affect Depressed  Speech Soft;Logical/coherent  Interaction Assertive  Motor Activity Slow  Appearance/Hygiene Unremarkable  Behavior Characteristics Cooperative  Mood Depressed;Sad  Thought Process  Coherency WDL  Content WDL  Delusions None reported or observed  Perception WDL  Hallucination None reported or observed  Judgment Impaired  Confusion None  Danger to Self  Current suicidal ideation? Denies  Self-Injurious Behavior No self-injurious ideation or behavior indicators observed or expressed   Agreement Not to Harm Self Yes  Description of Agreement verbal  Danger to Others  Danger to Others None reported or observed

## 2022-10-09 NOTE — Progress Notes (Signed)
   10/09/22 0554  15 Minute Checks  Location Bedroom  Visual Appearance Calm  Behavior Sleeping  Sleep (Behavioral Health Patients Only)  Calculate sleep? (Click Yes once per 24 hr at 0600 safety check) Yes  Documented sleep last 24 hours 7.25

## 2022-10-09 NOTE — Group Note (Signed)
Date:  10/09/2022 Time:  10:42 AM  Group Topic/Focus:  Goals Group:   The focus of this group is to help patients establish daily goals to achieve during treatment and discuss how the patient can incorporate goal setting into their daily lives to aide in recovery. Orientation:   The focus of this group is to educate the patient on the purpose and policies of crisis stabilization and provide a format to answer questions about their admission.  The group details unit policies and expectations of patients while admitted.    Participation Level:  Did Not Attend  Participation Quality:    Affect:    Cognitive:    Insight:   Engagement in Group:    Modes of Intervention:    Additional Comments:    Jaquita Rector 10/09/2022, 10:42 AM

## 2022-10-09 NOTE — Progress Notes (Signed)
   10/08/22 1400  Psych Admission Type (Psych Patients Only)  Admission Status Voluntary  Psychosocial Assessment  Patient Complaints Depression;Anxiety  Eye Contact Brief  Facial Expression Sad  Affect Depressed  Speech Soft  Interaction Assertive  Motor Activity Slow  Appearance/Hygiene Unremarkable  Behavior Characteristics Cooperative  Mood Depressed;Sad  Thought Process  Coherency WDL  Content WDL  Delusions None reported or observed  Perception WDL  Hallucination None reported or observed  Judgment WDL  Confusion None  Danger to Self  Current suicidal ideation? Denies  Danger to Others  Danger to Others None reported or observed

## 2022-10-09 NOTE — Plan of Care (Signed)
  Problem: Fluid Volume: Goal: Ability to maintain a balanced intake and output will improve Outcome: Progressing   Problem: Nutritional: Goal: Maintenance of adequate nutrition will improve Outcome: Progressing   

## 2022-10-09 NOTE — [facility] Suicide Risk Assessment (Signed)
[facility] INPATIENT:  Family/Significant Other Suicide Prevention Education  Suicide Prevention Education:  Education Completed; [name] ( ex) [phone] ,  (name of family member/significant other) has been identified by the patient as the family member/significant other with whom the patient will be residing, and identified as the person(s) who will aid the patient in the event of a mental health crisis (suicidal ideations/suicide attempt).  With written consent from the patient, the family member/significant other has been provided the following suicide prevention education, prior to the and/or following the discharge of the patient.  CSW spoke with patient friend and completed safety planning. [name] was rude to CSW stating that she had already spoke with someone about patient and told them how patient would have "breakdowns" and say he wants to kill himself. Friend said that patient does not have any guns or weapons in his home. CSW was trying to tell friend after she confirmed it being no guns or weapons that patient has been trying to call her but friend phone discounted and CSW was unable to get back in contact after calling back.    The suicide prevention education provided includes the following: Suicide risk factors Suicide prevention and interventions National Suicide Hotline telephone number [facility] assessment telephone number [facility] Emergency Assistance [address] [facility] and/or Residential Mobile Crisis Unit telephone number  Request made of family/significant other to: Remove weapons (e.g., guns, rifles, knives), all items previously/currently identified as safety concern.   Remove drugs/medications (over-the-counter, prescriptions, illicit drugs), all items previously/currently identified as a safety concern.  The family member/significant other verbalizes understanding of the suicide prevention education information provided.  The family member/significant  other agrees to remove the items of safety concern listed above.  Isabella Bowens 10/09/2022, 3:47 PM

## 2022-10-09 NOTE — Progress Notes (Signed)
   10/09/22 0800  Psych Admission Type (Psych Patients Only)  Admission Status Voluntary  Psychosocial Assessment  Patient Complaints Anxiety;Depression  Eye Contact Brief  Facial Expression Sad  Affect Depressed  Speech Soft;Logical/coherent  Interaction Assertive  Motor Activity Slow  Appearance/Hygiene Unremarkable  Behavior Characteristics Cooperative  Mood Depressed;Sad  Thought Process  Coherency WDL  Content WDL  Delusions None reported or observed  Perception WDL  Hallucination None reported or observed  Judgment Impaired  Confusion None  Danger to Self  Current suicidal ideation? Denies  Self-Injurious Behavior No self-injurious ideation or behavior indicators observed or expressed   Agreement Not to Harm Self Yes  Description of Agreement Patient could not contract for safety  Danger to Others  Danger to Others None reported or observed

## 2022-10-09 NOTE — [facility] Group Notes (Signed)
[facility] Group Notes:  (Nursing/MHT/Case Management/Adjunct)  Date:  10/09/2022  Time:  9:01 PM  Type of Therapy:  Group Therapy  Participation Level:  Active  Participation Quality:  Appropriate  Affect:  Appropriate  Cognitive:  Appropriate  Insight:  Appropriate  Engagement in Group:  Engaged  Modes of Intervention:  Education  Summary of Progress/Problems: Participated in Positive thinking exercise. Goal to be more patient. Day 6/10.  Luis [name] 10/09/2022, 9:01 PM

## 2022-10-09 NOTE — Progress Notes (Signed)
Patient placed on 1:1 at 1153 because he was unable to contract for safety. Patient states "I'm tired of being sick and lonely." Per doctor order, patient will continue to be on 1:1. No distress noted at this time. Staff will continue to provide support to patient.

## 2022-10-09 NOTE — Group Note (Signed)
Date:  10/09/2022 Time:  2:14 PM  Group Topic/Focus:  Emotional Education:   The focus of this group is to discuss what feelings/emotions are, and how they are experienced.    Participation Level:  Did Not Attend  Participation Quality:    Affect:    Cognitive:    Insight:   Engagement in Group:    Modes of Intervention:    Additional Comments:    Jaquita Rector 10/09/2022, 2:14 PM
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
## 2022-10-09 NOTE — Group Note (Signed)
Recreation Therapy Group Note   Group Topic:Relaxation  Group Date: 10/09/2022 Start Time: 0930 End Time: 1022 Facilitators: Nayib Remer-McCall, LRT,CTRS Location: [address] Dayroom   Goal Area(s) Addresses:  Patient will define components of whole wellness. Patient will verbalize benefit of whole wellness.  Group Description: Music Therapy.  LRT and patients discussed how the types of music you listen to can help you clear your mind, feel calm and relax.  Patients were then given the opportunity to pick songs that they found relaxing and calming.    Affect/Mood: N/A   Participation Level: Did not attend    Clinical Observations/Individualized Feedback:    Plan: Continue to engage patient in RT group sessions 2-3x/week.   Luis [name], LRT,CTRS  10/09/2022 1:02 PM

## 2022-10-09 NOTE — Progress Notes (Signed)
Patient was taken off 1:1 by doctor because he contracted for safety. Q 15 minutes safety observation will continue to be in place. No distress noted at this time. Staff will continue to provide support to patient.

## 2022-10-09 NOTE — Group Note (Unsigned)
Date:  10/09/2022 Time:  9:15 AM  Group Topic/Focus:  Goals Group:   The focus of this group is to help patients establish daily goals to achieve during treatment and discuss how the patient can incorporate goal setting into their daily lives to aide in recovery. Orientation:   The focus of this group is to educate the patient on the purpose and policies of crisis stabilization and provide a format to answer questions about their admission.  The group details unit policies and expectations of patients while admitted.     Participation Level:  {BHH PARTICIPATION LEVEL:22264}  Participation Quality:  {BHH PARTICIPATION QUALITY:22265}  Affect:  {BHH AFFECT:22266}  Cognitive:  {BHH COGNITIVE:22267}  Insight: {BHH Insight2:20797}  Engagement in Group:  {BHH ENGAGEMENT IN GROUP:22268}  Modes of Intervention:  {BHH MODES OF INTERVENTION:22269}  Additional Comments:  ***  Wells Mabe Lashawn Asia Dusenbury 10/09/2022, 9:15 AM  

## 2022-10-09 NOTE — Progress Notes (Signed)
  Patient requested sleep medication during medication administration.  Administered PRN Trazodone per [facility] per patient request.

## 2022-10-10 LAB — GLUCOSE, CAPILLARY
Glucose-Capillary: 150 mg/dL — ABNORMAL HIGH (ref 70–99)
Glucose-Capillary: 267 mg/dL — ABNORMAL HIGH (ref 70–99)
Glucose-Capillary: 167 mg/dL — ABNORMAL HIGH (ref 70–99)
Glucose-Capillary: 231 mg/dL — ABNORMAL HIGH (ref 70–99)

## 2022-10-10 MED ORDER — ACETAMINOPHEN 500 MG PO TABS
500.0000 mg | ORAL_TABLET | Freq: Four times a day (QID) | ORAL | Status: DC | PRN
  Administered 2022-10-17: 500 mg via ORAL
  Filled 2022-10-10: qty 1

## 2022-10-10 MED ORDER — ACETAMINOPHEN 325 MG PO TABS
650.0000 mg | ORAL_TABLET | Freq: Four times a day (QID) | ORAL | Status: DC
  Administered 2022-10-10 – 2022-10-14 (×10): 650 mg via ORAL
  Filled 2022-10-10 (×47): qty 2

## 2022-10-10 NOTE — Group Note (Signed)
Date:  10/10/2022 Time:  9:55 AM  Group Topic/Focus:  Orientation:   The focus of this group is to educate the patient on the purpose and policies of crisis stabilization and provide a format to answer questions about their admission.  The group details unit policies and expectations of patients while admitted.    Participation Level:  Minimal  Participation Quality:  Attentive  Affect:  Appropriate  Cognitive:  Appropriate  Insight: Good and Limited  Engagement in Group:  Limited  Modes of Intervention:  Discussion  Additional Comments:     Reymundo Poll 10/10/2022, 9:55 AM

## 2022-10-10 NOTE — Progress Notes (Signed)
   10/10/22 2157  Psych Admission Type (Psych Patients Only)  Admission Status Voluntary  Psychosocial Assessment  Patient Complaints Sadness (r/t anniversary of wife's death)  Eye Contact Fair  Facial Expression Sad  Affect Depressed  Speech Logical/coherent  Interaction Assertive  Motor Activity Slow  Appearance/Hygiene Unremarkable  Behavior Characteristics Cooperative  Mood Sad  Thought Process  Coherency WDL  Content WDL  Delusions None reported or observed  Perception WDL  Hallucination None reported or observed  Judgment Poor  Confusion None  Danger to Self  Current suicidal ideation? Denies  Self-Injurious Behavior No self-injurious ideation or behavior indicators observed or expressed   Agreement Not to Harm Self Yes  Description of Agreement verbal  Danger to Others  Danger to Others None reported or observed   Pt is socially interactive with staff and speaks about his concerns at home. Pt mentions being sad r/t the anniversary of his wife's death. Pt also voices frustration with home situation involving current girlfriend. Therapeutic communication provided and coping skills discussed with pt. Pt denies SI/HI/AVH. Pt is otherwise calm and cooperative, pleasant.

## 2022-10-10 NOTE — Progress Notes (Signed)
PT Cancellation Note  Patient Details Name: Luis [name] MRN: [id number] DOB: 1971-01-24   Cancelled Treatment:     Pt has been assess by OT on staff at [facility].  OT reports, he will manage all mobility needs and contact PT if further assist required.  PT will sign off at this time.   [name] 10/10/2022, 2:58 PM

## 2022-10-10 NOTE — Progress Notes (Signed)
   10/10/22 0554  15 Minute Checks  Location Bedroom  Visual Appearance Calm  Behavior Sleeping  Sleep (Behavioral Health Patients Only)  Calculate sleep? (Click Yes once per 24 hr at 0600 safety check) Yes  Documented sleep last 24 hours 8.25

## 2022-10-10 NOTE — Progress Notes (Signed)
   10/09/22 2100  Psych Admission Type (Psych Patients Only)  Admission Status Voluntary  Psychosocial Assessment  Patient Complaints Anxiety;Depression  Eye Contact Brief  Facial Expression Sad  Affect Depressed  Speech Soft;Logical/coherent  Interaction Assertive  Motor Activity Slow  Appearance/Hygiene Unremarkable  Thought Process  Coherency WDL  Content WDL  Delusions None reported or observed  Perception WDL  Hallucination None reported or observed  Judgment Impaired  Confusion None  Danger to Self  Current suicidal ideation? Denies  Self-Injurious Behavior No self-injurious ideation or behavior indicators observed or expressed   Agreement Not to Harm Self Yes  Description of Agreement verbal  Danger to Others  Danger to Others None reported or observed

## 2022-10-10 NOTE — Plan of Care (Signed)
  Problem: Health Behavior/Discharge Planning: Goal: Ability to manage health-related needs will improve Outcome: Progressing   Problem: Nutritional: Goal: Maintenance of adequate nutrition will improve Outcome: Progressing   

## 2022-10-10 NOTE — Progress Notes (Signed)
DAR NOTE: Patient presents with anxious affect and depressed mood.  Denies suicidal thoughts, pain, auditory and visual hallucinations.  Rates depression at 9, hopelessness at 9, and anxiety at 10.  Maintained on routine safety checks.  Medications given as prescribed.  Support and encouragement offered as needed.  Attended group and participated.  States goal for today is "get out of my head."  Patient visible in milieu with minimal interaction.  Patient is safe  on and off the unit.
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
## 2022-10-11 LAB — GLUCOSE, CAPILLARY
Glucose-Capillary: 132 mg/dL — ABNORMAL HIGH (ref 70–99)
Glucose-Capillary: 257 mg/dL — ABNORMAL HIGH (ref 70–99)
Glucose-Capillary: 273 mg/dL — ABNORMAL HIGH (ref 70–99)
Glucose-Capillary: 226 mg/dL — ABNORMAL HIGH (ref 70–99)
Glucose-Capillary: 285 mg/dL — ABNORMAL HIGH (ref 70–99)

## 2022-10-11 MED ORDER — DIPHENHYDRAMINE HCL 25 MG PO CAPS
50.0000 mg | ORAL_CAPSULE | Freq: Three times a day (TID) | ORAL | Status: DC | PRN
  Administered 2022-10-11: 50 mg via ORAL

## 2022-10-11 MED ORDER — DIPHENHYDRAMINE HCL 50 MG/ML IJ SOLN: 50.0000 mg | Freq: Three times a day (TID) | INTRAMUSCULAR | Status: DC | PRN

## 2022-10-11 MED ORDER — GABAPENTIN 100 MG PO CAPS
200.0000 mg | ORAL_CAPSULE | Freq: Three times a day (TID) | ORAL | Status: DC
  Administered 2022-10-11 – 2022-10-13 (×5): 200 mg via ORAL
  Filled 2022-10-11 (×9): qty 2

## 2022-10-11 NOTE — Group Note (Signed)
Date:  10/11/2022 Time:  11:14 AM  Group Topic/Focus:  Orientation:   The focus of this group is to educate the patient on the purpose and policies of crisis stabilization and provide a format to answer questions about their admission.  The group details unit policies and expectations of patients while admitted.    Participation Level:  Active  Participation Quality:  Appropriate  Affect:  Appropriate  Cognitive:  Appropriate  Insight: Good  Engagement in Group:  Engaged  Modes of Intervention:  Discussion  Additional Comments:     Reymundo Poll 10/11/2022, 11:14 AM

## 2022-10-11 NOTE — Progress Notes (Signed)
Luis [name] rates sleep as "Good". He denies SI/HI/AVH. Pt presents as tearful on approach, he states today is his deceased wife's birthday. Pt is expressing grief and increase sadness. Therapeutic 1:1 given with Pt. Pt is forthcoming to discuss fond memories of his wife. A spiritual consult order was obtained. Pt remains safe.

## 2022-10-11 NOTE — Progress Notes (Addendum)
Pt got back from dinner and stated that his arms are itchy and was observed scratching. No hives. No rash. Pt stated he was allergic to the cinnamon in the carrots, it was not listed in his allergy list. Allergy list updated. PRN benadryl 50 mg given PO.

## 2022-10-11 NOTE — Progress Notes (Signed)
   10/11/22 0616  15 Minute Checks  Location Hallway  Visual Appearance Calm  Behavior Composed  Sleep (Behavioral Health Patients Only)  Calculate sleep? (Click Yes once per 24 hr at 0600 safety check) Yes  Documented sleep last 24 hours 6.75
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
## 2022-10-12 ENCOUNTER — Encounter (HOSPITAL_COMMUNITY): Payer: Self-pay

## 2022-10-12 LAB — GLUCOSE, CAPILLARY
Glucose-Capillary: 141 mg/dL — ABNORMAL HIGH (ref 70–99)
Glucose-Capillary: 161 mg/dL — ABNORMAL HIGH (ref 70–99)
Glucose-Capillary: 324 mg/dL — ABNORMAL HIGH (ref 70–99)

## 2022-10-12 MED ORDER — MELATONIN 3 MG PO TABS
3.0000 mg | ORAL_TABLET | Freq: Every day | ORAL | Status: DC
  Administered 2022-10-12 – 2022-10-16 (×4): 3 mg via ORAL
  Filled 2022-10-12 (×10): qty 1

## 2022-10-12 NOTE — Progress Notes (Signed)
Occupational Therapy Treatment Patient Details Name: Luis [name] MRN: [id number] DOB: Feb 02, 1971 Today's Date: 10/12/2022   History of present illness per H&P:Siddiq A Etzel is a 52 y.o., male with a past psychiatric history significant for recurrent, severe MDD, PTSD, and substance use disorder who presents to the [facility] voluntarily from [facility] for evaluation and management of Severe MDD with SI.   OT comments  Pt is seen for OT Tx today s/p group session. Pt is now ambulating in hallway for short distances w/o RW as he does at baseline / bADLs are also improving towards baseline. No LOB or sway noted during this visit / is performing transfers w/o assist at this time. OT will cont to follow per POC.    Recommendations for follow up therapy are one component of a multi-disciplinary discharge planning process, led by the attending physician.  Recommendations may be updated based on patient status, additional functional criteria and insurance authorization.       Patient can return home with the following  A little help with walking and/or transfers;A little help with bathing/dressing/bathroom;Other (comment) (improving towards baseline of function)               Precautions / Restrictions Precautions Precautions: Fall Precaution Comments: improving Restrictions Weight Bearing Restrictions: No       Mobility Bed Mobility Overal bed mobility: Independent             General bed mobility comments: additional time to complete task    Transfers Overall transfer level: Modified independent                 General transfer comment: has been using RW for longer walks / has been using RW when going outside for rec Tx / will take meds and attend group w/o RW. No c/o imbalance since evaluation     Balance Overall balance assessment: Modified Independent                                          ADL either performed or assessed with clinical judgement   ADL Overall ADL's : At baseline (contineus to improve towards baseline of function) Eating/Feeding: Independent           Lower Body Bathing: Set up Lower Body Bathing Details (indicate cue type and reason): increased difficulty w/ LB tasks d/t bending and reaching difficulties / improving Upper Body Dressing : Independent                   Functional mobility during ADLs: Independent General ADL Comments: improving towards baseline of function      Cognition Arousal/Alertness: Awake/alert Behavior During Therapy: WFL for tasks assessed/performed Overall Cognitive Status: Within Functional Limits for tasks assessed                                                       General Comments improving    Pertinent Vitals/ Pain       Pain Assessment Pain Assessment: No/denies pain  Frequency  Min 2X/week        Progress Toward Goals  OT Goals(current goals can now be found in the care plan section)  Progress towards OT goals: Progressing toward goals  Acute Rehab OT Goals Patient Stated Goal: improving ADLs and mobility w/o C/o imbalance OT Goal Formulation: With patient Time For Goal Achievement: 10/15/22 Potential to Achieve Goals: Good  Plan Discharge plan remains appropriate                     AM-PAC OT "6 Clicks" Daily Activity     Outcome Measure   Help from another person eating meals?: None Help from another person taking care of personal grooming?: None Help from another person toileting, which includes using toliet, bedpan, or urinal?: None Help from another person bathing (including washing, rinsing, drying)?: None Help from another person to put on and taking off regular upper body clothing?: None Help from another person to put on and taking off regular lower  body clothing?: A Little 6 Click Score: 23    End of Session Equipment Utilized During Treatment: Rolling walker (2 wheels)  OT Visit Diagnosis: Other abnormalities of gait and mobility (R26.89)   Activity Tolerance Patient tolerated treatment well   Patient Left in chair   Nurse Communication Mobility status        Time: 2549-8264 OT Time Calculation (min): 15 min  Charges: OT General Charges $OT Visit: 1 Visit OT Treatments $Self Care/Home Management : 8-22 mins  Kerrin Champagne, OT   Ted Mcalpine 10/12/2022, 6:10 PM

## 2022-10-12 NOTE — Progress Notes (Signed)
   10/12/22 1228  Psych Admission Type (Psych Patients Only)  Admission Status Voluntary  Psychosocial Assessment  Patient Complaints Anxiety;Depression  Eye Contact Fair  Facial Expression Anxious;Flat  Affect Depressed;Flat  Speech Logical/coherent  Interaction Assertive  Motor Activity Slow  Appearance/Hygiene Unremarkable  Behavior Characteristics Cooperative;Anxious  Mood Depressed;Anxious  Thought Process  Coherency WDL  Content WDL  Delusions None reported or observed  Perception WDL  Hallucination None reported or observed  Judgment Limited  Confusion None  Danger to Self  Current suicidal ideation? Denies  Self-Injurious Behavior No self-injurious ideation or behavior indicators observed or expressed   Agreement Not to Harm Self Yes  Description of Agreement verbally contracts for safety  Danger to Others  Danger to Others None reported or observed

## 2022-10-12 NOTE — Inpatient Diabetes Management (Signed)
Inpatient Diabetes Program Recommendations  AACE/ADA: New Consensus Statement on Inpatient Glycemic Control (2015)  Target Ranges:  Prepandial:   less than 140 mg/dL      Peak postprandial:   less than 180 mg/dL (1-2 hours)      Critically ill patients:  140 - 180 mg/dL   Lab Results  Component Value Date   GLUCAP 141 (H) 10/12/2022   HGBA1C 9.2 (H) 10/05/2022    Review of Glycemic Control  Latest Reference Range & Units 10/11/22 06:15 10/11/22 07:55 10/11/22 11:54 10/11/22 17:25 10/11/22 20:45 10/12/22 05:25  Glucose-Capillary 70 - 99 mg/dL 524 (H) 818 (H) 590 (H) 226 (H) 285 (H) 141 (H)   Diabetes history: DM 2 Outpatient Diabetes medications: Farxiga 10 mg Daily, 70/30 60 units tid, Semglee 20 units qhs Current orders for Inpatient glycemic control:  Farxiga 10 mg Daily Semglee 20 units qhs Novolog 0-9 units tid 70/30 60 units tid  Inpatient Diabetes Program Recommendations:    Glucose trends increase after PO intake.  -   Consider increasing 70/30 insulin to 65 units before breakfast and lunch, leave dinnertime dose at 60 units  Thanks,  Christena Deem RN, MSN, BC-ADM Inpatient Diabetes Coordinator Team Pager [phone] (8a-5p)

## 2022-10-12 NOTE — B[facility] Group Notes (Signed)
Adult Psychoeducational Group Note  Date:  10/12/2022 Time:  9:38 PM  Group Topic/Focus:  Wrap-Up Group:   The focus of this group is to help patients review their daily goal of treatment and discuss progress on daily workbooks.  Participation Level:  Active  Participation Quality:  Appropriate  Affect:  Appropriate  Cognitive:  Appropriate  Insight: Appropriate  Engagement in Group:  Engaged  Modes of Intervention:  Discussion and Support  Additional Comments:  Pt attended the evening group and responded to all discussion prompts from the Writer. Pt shared that today was a good day on the unit, the highlight of which was a nice visit from a supportive friend. On the subject of goals for the week, Pt mentioned wanting to do well upon his pending discharge. ("I'm starting to feel optimistic about things.") Pt rated his day an 8 out of 10.  [name] 10/12/2022, 9:38 PM

## 2022-10-12 NOTE — Progress Notes (Signed)
Claussen, Kathryn A, Chaplain  Kden Wagster B, Chaplain Spiritual care group on grief and loss facilitated by Chaplain Katy Claussen, Bcc and Chaplain Maysel Mccolm   Group Goal: Support / Education around grief and loss   Members engage in facilitated group support and psycho-social education.   Group Description:   Following introductions and group rules, group members engaged in facilitated group dialogue and support around topic of loss, with particular support around experiences of loss in their lives. Group Identified types of loss (relationships / self / things) and identified patterns, circumstances, and changes that precipitate losses. Reflected on thoughts / feelings around loss, normalized grief responses, and recognized variety in grief experience. Group encouraged individual reflection on safe space and on the coping skills that they are already utilizing.   Group drew on Adlerian / Rogerian and narrative framework   Patient Progress: Patient attended group. Patient participated in group discussion appropriately. Patient shared experiences with grief and loss. Patient engagement was insightful and supported by other peers.     Participation Level: Active Participation Quality: Appropriate Insight: Appropriate Engagement in Group: Engaged Modes of Intervention: Discussion and Worksheets   Carli Lefevers Chaplain        

## 2022-10-12 NOTE — BH IP Treatment Plan (Signed)
Interdisciplinary Treatment and Diagnostic Plan Update  10/12/2022 Time of Session: 0830 Luis [name] MRN: [id number]  Principal Diagnosis: MDD (major depressive disorder), recurrent severe, without psychosis  Secondary Diagnoses: Principal Problem:   MDD (major depressive disorder), recurrent severe, without psychosis   Current Medications:  Current Facility-Administered Medications  Medication Dose Route Frequency Provider Last Rate Last Admin   acetaminophen (TYLENOL) tablet 500 mg  500 mg Oral Q6H PRN Karie Fetch, MD       acetaminophen (TYLENOL) tablet 650 mg  650 mg Oral Q6H Karie Fetch, MD   650 mg at 10/12/22 1423   alum & mag hydroxide-simeth (MAALOX/MYLANTA) 200-200-20 MG/5ML suspension 30 mL  30 mL Oral Q4H PRN Ardis Hughs, NP       carvedilol (COREG) tablet 3.125 mg  3.125 mg Oral BID WC Ardis Hughs, NP   3.125 mg at 10/12/22 4098   dapagliflozin propanediol (FARXIGA) tablet 10 mg  10 mg Oral Daily Ardis Hughs, NP   10 mg at 10/12/22 1191   digoxin (LANOXIN) tablet 0.125 mg  0.125 mg Oral Daily Ardis Hughs, NP   0.125 mg at 10/12/22 4782   diphenhydrAMINE (BENADRYL) capsule 50 mg  50 mg Oral TID PRN Starleen Blue, NP   50 mg at 10/11/22 9562   Or   diphenhydrAMINE (BENADRYL) injection 50 mg  50 mg Intramuscular TID PRN Starleen Blue, NP       DULoxetine (CYMBALTA) DR capsule 60 mg  60 mg Oral Daily Rex Kras, MD   60 mg at 10/12/22 1308   gabapentin (NEURONTIN) capsule 200 mg  200 mg Oral TID Rex Kras, MD   200 mg at 10/12/22 1204   gatifloxacin (ZYMAXID) 0.5 % ophthalmic drops 1 drop  1 drop Both Eyes QID Karie Fetch, MD   1 drop at 10/12/22 1205   haloperidol (HALDOL) tablet 5 mg  5 mg Oral TID PRN Ardis Hughs, NP       Or   haloperidol lactate (HALDOL) injection 5 mg  5 mg Intramuscular TID PRN Ardis Hughs, NP       hydrOXYzine (ATARAX) tablet 25 mg  25 mg Oral TID PRN Ardis Hughs, NP    25 mg at 10/12/22 0815   insulin aspart (novoLOG) injection 0-9 Units  0-9 Units Subcutaneous TID WC Ardis Hughs, NP   2 Units at 10/12/22 1254   insulin aspart protamine- aspart (NOVOLOG MIX 70/30) injection 60 Units  60 Units Subcutaneous TID with meals Ardis Hughs, NP   60 Units at 10/12/22 1255   insulin glargine-yfgn (SEMGLEE) injection 20 Units  20 Units Subcutaneous QHS Ardis Hughs, NP   20 Units at 10/11/22 2155   LORazepam (ATIVAN) tablet 2 mg  2 mg Oral TID PRN Ardis Hughs, NP       Or   LORazepam (ATIVAN) injection 2 mg  2 mg Intramuscular TID PRN Ardis Hughs, NP       magnesium hydroxide (MILK OF MAGNESIA) suspension 30 mL  30 mL Oral Daily PRN Ardis Hughs, NP       melatonin tablet 3 mg  3 mg Oral QHS Karie Fetch, MD       polyvinyl alcohol (LIQUIFILM TEARS) 1.4 % ophthalmic solution 1 drop  1 drop Both Eyes PRN Ardis Hughs, NP       potassium chloride SA (KLOR-CON M) CR tablet 20 mEq  20 mEq Oral Daily Ardis Hughs, NP  20 mEq at 10/12/22 1610   prazosin (MINIPRESS) capsule 2 mg  2 mg Oral QHS Vernard Gambles H, NP   2 mg at 10/11/22 2150   prednisoLONE acetate (PRED FORTE) 1 % ophthalmic suspension 1 drop  1 drop Both Eyes BID Karie Fetch, MD   1 drop at 10/12/22 0816   rosuvastatin (CRESTOR) tablet 20 mg  20 mg Oral Daily Ardis Hughs, NP   20 mg at 10/12/22 9604   sacubitril-valsartan (ENTRESTO) 97-103 mg per tablet  1 tablet Oral BID Ardis Hughs, NP   1 tablet at 10/12/22 5409   spironolactone (ALDACTONE) tablet 25 mg  25 mg Oral Daily Ardis Hughs, NP   25 mg at 10/12/22 8119   torsemide (DEMADEX) tablet 60 mg  60 mg Oral Daily Ardis Hughs, NP   60 mg at 10/12/22 1478   traZODone (DESYREL) tablet 200 mg  200 mg Oral QHS PRN Ardis Hughs, NP   200 mg at 10/11/22 2150   PTA Medications: Medications Prior to Admission  Medication Sig Dispense Refill Last Dose   apixaban (ELIQUIS)  5 MG TABS tablet Take 1 tablet (5 mg total) by mouth 2 (two) times daily. 60 tablet     carvedilol (COREG) 3.125 MG tablet Take 3.125 mg by mouth 2 (two) times daily with a meal.      dapagliflozin propanediol (FARXIGA) 10 MG TABS tablet Take 1 tablet (10 mg total) by mouth daily. 90 tablet 2    digoxin (LANOXIN) 0.125 MG tablet Take 0.125 mg by mouth daily.      [EXPIRED] HYDROcodone-acetaminophen (NORCO/VICODIN) 5-325 MG tablet Take 2 tablets by mouth every 6 (six) hours as needed for up to 5 days. 40 tablet 0    hydrOXYzine (ATARAX) 25 MG tablet Take 1 tablet (25 mg total) by mouth 3 (three) times daily as needed for anxiety. 75 tablet 1    insulin aspart protamine- aspart (NOVOLOG MIX 70/30) (70-30) 100 UNIT/ML injection Inject 0.6 mLs (60 Units total) into the skin with breakfast, with lunch, and with evening meal. 10 mL 11    insulin glargine-yfgn (SEMGLEE) 100 UNIT/ML injection Inject 0.2 mLs (20 Units total) into the skin at bedtime. 10 mL 11    polyvinyl alcohol (LIQUIFILM TEARS) 1.4 % ophthalmic solution Place 1 drop into both eyes as needed for dry eyes. 15 mL 0    potassium chloride SA (KLOR-CON M) 20 MEQ tablet Take 20 mEq by mouth daily.      prazosin (MINIPRESS) 2 MG capsule Take 1 capsule (2 mg total) by mouth at bedtime. 30 capsule 1    rosuvastatin (CRESTOR) 20 MG tablet Take 1 tablet (20 mg total) by mouth daily. 90 tablet 2    sacubitril-valsartan (ENTRESTO) 97-103 MG Take 1 tablet by mouth 2 (two) times daily. 60 tablet     spironolactone (ALDACTONE) 25 MG tablet Take 1 tablet (25 mg total) by mouth daily.      torsemide (DEMADEX) 20 MG tablet Take 3 tablets (60 mg total) by mouth daily. 270 tablet 2    traZODone (DESYREL) 100 MG tablet Take 2 tablets (200 mg total) by mouth at bedtime. 28 tablet 0     Patient Stressors:    Patient Strengths:    Treatment Modalities: Medication Management, Group therapy, Case management,  1 to 1 session with clinician, Psychoeducation,  Recreational therapy.   Physician Treatment Plan for Primary Diagnosis: MDD (major depressive disorder), recurrent severe, without psychosis Long Term Goal(s):  Improvement in symptoms so as ready for discharge   Short Term Goals: Ability to identify changes in lifestyle to reduce recurrence of condition will improve Ability to verbalize feelings will improve Ability to disclose and discuss suicidal ideas Ability to demonstrate self-control will improve Ability to identify and develop effective coping behaviors will improve  Medication Management: Evaluate patient's response, side effects, and tolerance of medication regimen.  Therapeutic Interventions: 1 to 1 sessions, Unit Group sessions and Medication administration.  Evaluation of Outcomes: Progressing  Physician Treatment Plan for Secondary Diagnosis: Principal Problem:   MDD (major depressive disorder), recurrent severe, without psychosis  Long Term Goal(s): Improvement in symptoms so as ready for discharge   Short Term Goals: Ability to identify changes in lifestyle to reduce recurrence of condition will improve Ability to verbalize feelings will improve Ability to disclose and discuss suicidal ideas Ability to demonstrate self-control will improve Ability to identify and develop effective coping behaviors will improve     Medication Management: Evaluate patient's response, side effects, and tolerance of medication regimen.  Therapeutic Interventions: 1 to 1 sessions, Unit Group sessions and Medication administration.  Evaluation of Outcomes: Progressing   RN Treatment Plan for Primary Diagnosis: MDD (major depressive disorder), recurrent severe, without psychosis Long Term Goal(s): Knowledge of disease and therapeutic regimen to maintain health will improve  Short Term Goals: Ability to remain free from injury will improve, Ability to verbalize frustration and anger appropriately will improve, Ability to demonstrate  self-control, Ability to participate in decision making will improve, Ability to verbalize feelings will improve, Ability to disclose and discuss suicidal ideas, Ability to identify and develop effective coping behaviors will improve, and Compliance with prescribed medications will improve  Medication Management: RN will administer medications as ordered by provider, will assess and evaluate patient's response and provide education to patient for prescribed medication. RN will report any adverse and/or side effects to prescribing provider.  Therapeutic Interventions: 1 on 1 counseling sessions, Psychoeducation, Medication administration, Evaluate responses to treatment, Monitor vital signs and CBGs as ordered, Perform/monitor CIWA, COWS, AIMS and Fall Risk screenings as ordered, Perform wound care treatments as ordered.  Evaluation of Outcomes: Progressing   LCSW Treatment Plan for Primary Diagnosis: MDD (major depressive disorder), recurrent severe, without psychosis Long Term Goal(s): Safe transition to appropriate next level of care at discharge, Engage patient in therapeutic group addressing interpersonal concerns.  Short Term Goals: Engage patient in aftercare planning with referrals and resources, Increase social support, Increase ability to appropriately verbalize feelings, Increase emotional regulation, Facilitate acceptance of mental health diagnosis and concerns, Facilitate patient progression through stages of change regarding substance use diagnoses and concerns, Identify triggers associated with mental health/substance abuse issues, and Increase skills for wellness and recovery  Therapeutic Interventions: Assess for all discharge needs, 1 to 1 time with Social worker, Explore available resources and support systems, Assess for adequacy in community support network, Educate family and significant other(s) on suicide prevention, Complete Psychosocial Assessment, Interpersonal group  therapy.  Evaluation of Outcomes: Progressing   Progress in Treatment: Attending groups: Yes. Participating in groups: Yes. Taking medication as prescribed: Yes. Toleration medication: Yes. Family/Significant other contact made: Yes, individual(s) contacted:  [name] ( ex) [phone] Patient understands diagnosis: Yes. Discussing patient identified problems/goals with staff: Yes. Medical problems stabilized or resolved: Yes. Denies suicidal/homicidal ideation: No. Issues/concerns per patient self-inventory: Yes. Other: none  New problem(s) identified: No, Describe:  none  New Short Term/Long Term Goal(s): Patient to work towards detox, elimination of symptoms of  psychosis, medication management for mood stabilization; elimination of SI thoughts; development of comprehensive mental wellness/sobriety plan.  Patient Goals:  No additional goals identified at this time. Patient to continue to work towards original goals identified in initial treatment team meeting. CSW will remain available to patient should they voice additional treatment goals.   Discharge Plan or Barriers: No psychosocial barriers identified at this time, patient to return to place of residence when appropriate for discharge.   Reason for Continuation of Hospitalization: Delusions  Depression Medication stabilization  Estimated Length of Stay: 1-7 days   Last 3 [place] Suicide Severity Risk Score: Flowsheet Row Admission (Current) [facility] from 10/05/2022 in [facility] ED from 10/03/2022 in [facility]  C-SSRS RISK CATEGORY Moderate Risk High Risk No Risk       Last PHQ 2/9 Scores:    03/30/2022    2:11 PM 09/03/2021    1:21 PM 08/05/2021    2:30 PM  Depression screen PHQ 2/9  Decreased Interest 3 1 2   Down, Depressed, Hopeless 3 1 3   PHQ - 2 Score 6 2 5   Altered sleeping  3 1   Tired, decreased energy  3 1  Change in appetite  0 1  Feeling bad or failure about yourself   0 2  Trouble concentrating  0 2  Moving slowly or fidgety/restless  0 1  Suicidal thoughts  0 2  PHQ-9 Score  8 15  Difficult doing work/chores  Extremely dIfficult Very difficult    Scribe for Treatment Team: Almedia Balls 10/12/2022 3:22 PM

## 2022-10-12 NOTE — Progress Notes (Signed)
   10/12/22 2030  Psych Admission Type (Psych Patients Only)  Admission Status Voluntary  Psychosocial Assessment  Patient Complaints Anxiety;Irritability  Eye Contact Fair  Facial Expression Anxious;Angry  Affect Depressed;Flat  Speech Logical/coherent  Interaction Assertive  Motor Activity Slow  Appearance/Hygiene Unremarkable  Behavior Characteristics Cooperative;Anxious  Mood Depressed;Anxious  Aggressive Behavior  Effect No apparent injury  Thought Process  Coherency WDL  Content WDL  Delusions None reported or observed  Perception WDL  Hallucination None reported or observed  Judgment Limited  Confusion None  Danger to Self  Current suicidal ideation? Denies  Danger to Others  Danger to Others None reported or observed

## 2022-10-12 NOTE — Progress Notes (Signed)
After completing admission and stepping back on unit, MHT notified this RN patient had refused blood sugar check. Attempted to give 1700 scheduled medications to patient along with BS check, but patient appeared irritable stating "too late," throwing the peace sign, and walking away. Attempted to encourage patient to be compliant with medications but patient too frustrated at this time.

## 2022-10-12 NOTE — Progress Notes (Signed)
   10/12/22 0555  15 Minute Checks  Location Bedroom  Visual Appearance Calm  Behavior Sleeping  Sleep (Behavioral Health Patients Only)  Calculate sleep? (Click Yes once per 24 hr at 0600 safety check) Yes  Documented sleep last 24 hours 7.5
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
## 2022-10-12 NOTE — Plan of Care (Signed)
  Problem: Health Behavior/Discharge Planning: Goal: Compliance with treatment plan for underlying cause of condition will improve Outcome: Progressing   Problem: Safety: Goal: Periods of time without injury will increase Outcome: Progressing   

## 2022-10-12 NOTE — Group Note (Signed)
Recreation Therapy Group Note   Group Topic:Team Building  Group Date: 10/12/2022 Start Time: 0933 End Time: 1010 Facilitators: Yoland Scherr-McCall, LRT,CTRS Location: [address] Dayroom   Goal Area(s) Addresses:  Patient will effectively work with peer towards shared goal.  Patient will identify skills used to make activity successful.  Patient will identify how skills used during activity can be used to reach post d/c goals.   Group Description: Straw Bridge. In teams of 3-5, patients were given 15 plastic drinking straws and an equal length of masking tape. Using the materials provided, patients were instructed to build a free standing bridge-like structure to suspend an everyday item (ex: puzzle box) off of the floor or table surface. All materials were required to be used by the team in their design. LRT facilitated post-activity discussion reviewing team process. Patients were encouraged to reflect how the skills used in this activity can be generalized to daily life post discharge.    Affect/Mood: N/A   Participation Level: Did not attend    Clinical Observations/Individualized Feedback:    Plan: Continue to engage patient in RT group sessions 2-3x/week.   Fredrika Canby-McCall, LRT,CTRS 10/12/2022 1:22 PM

## 2022-10-12 NOTE — [facility] Group Notes (Signed)
Chaplain received a consult to provide grief support to [place].  Chaplain asked him if he wanted to speak individually.  He said he would like to, but not today.  Chaplain will attempt again tomorrow.  [address] B[facility] Pager, [phone]

## 2022-10-12 NOTE — Group Note (Signed)
Occupational Therapy Group Note  Group Topic:Coping Skills  Group Date: 10/12/2022 Start Time: 1430 End Time: 1500 Facilitators: Ted Mcalpine, OT   Group Description: Group encouraged increased engagement and participation through discussion and activity focused on "Coping Ahead." Patients were split up into teams and selected a card from a stack of positive coping strategies. Patients were instructed to act out/charade the coping skill for other peers to guess and receive points for their team. Discussion followed with a focus on identifying additional positive coping strategies and patients shared how they were going to cope ahead over the weekend while continuing hospitalization stay.  Therapeutic Goal(s): Identify positive vs negative coping strategies. Identify coping skills to be used during hospitalization vs coping skills outside of hospital/at home Increase participation in therapeutic group environment and promote engagement in treatment   Participation Level: Engaged   Participation Quality: Independent   Behavior: Appropriate   Speech/Thought Process: Relevant   Affect/Mood: Appropriate   Insight: Improved   Judgement: Improved   Individualization: pt was engaged in their participation of group discussion/activity. New skills were identified  Modes of Intervention: Education  Patient Response to Interventions:  Engaged   Plan: Continue to engage patient in OT groups 2 - 3x/week.  10/12/2022  Ted Mcalpine, OT Kerrin Champagne, OT

## 2022-10-13 ENCOUNTER — Inpatient Hospital Stay (HOSPITAL_COMMUNITY): Payer: Non-veteran care

## 2022-10-13 ENCOUNTER — Encounter (HOSPITAL_COMMUNITY): Payer: Self-pay | Admitting: Psychiatry

## 2022-10-13 DIAGNOSIS — F332 Major depressive disorder, recurrent severe without psychotic features: Secondary | ICD-10-CM

## 2022-10-13 LAB — GLUCOSE, CAPILLARY
Glucose-Capillary: 159 mg/dL — ABNORMAL HIGH (ref 70–99)
Glucose-Capillary: 221 mg/dL — ABNORMAL HIGH (ref 70–99)
Glucose-Capillary: 209 mg/dL — ABNORMAL HIGH (ref 70–99)
Glucose-Capillary: 151 mg/dL — ABNORMAL HIGH (ref 70–99)

## 2022-10-13 MED ORDER — LIDOCAINE HCL URETHRAL/MUCOSAL 2 % EX GEL: 1.0000 | Freq: Every day | CUTANEOUS | Status: DC | PRN

## 2022-10-13 MED ORDER — WHITE PETROLATUM EX OINT
TOPICAL_OINTMENT | CUTANEOUS | Status: AC
  Filled 2022-10-13: qty 5

## 2022-10-13 MED ORDER — ONDANSETRON 4 MG PO TBDP
4.0000 mg | ORAL_TABLET | Freq: Once | ORAL | Status: AC
  Administered 2022-10-13: 4 mg via ORAL
  Filled 2022-10-13: qty 1

## 2022-10-13 MED ORDER — DULOXETINE HCL 30 MG PO CPEP
90.0000 mg | ORAL_CAPSULE | Freq: Every day | ORAL | Status: DC
  Administered 2022-10-14 – 2022-10-17 (×4): 90 mg via ORAL
  Filled 2022-10-13 (×9): qty 3

## 2022-10-13 MED ORDER — INSULIN ASPART PROT & ASPART (70-30 MIX) 100 UNIT/ML ~~LOC~~ SUSP
65.0000 [IU] | Freq: Two times a day (BID) | SUBCUTANEOUS | Status: DC
  Administered 2022-10-13 – 2022-10-16 (×7): 65 [IU] via SUBCUTANEOUS
  Filled 2022-10-13: qty 10

## 2022-10-13 MED ORDER — INSULIN ASPART PROT & ASPART (70-30 MIX) 100 UNIT/ML ~~LOC~~ SUSP
60.0000 [IU] | Freq: Every day | SUBCUTANEOUS | Status: DC
  Administered 2022-10-14 – 2022-10-16 (×3): 60 [IU] via SUBCUTANEOUS

## 2022-10-13 MED ORDER — ARIPIPRAZOLE 2 MG PO TABS
2.0000 mg | ORAL_TABLET | Freq: Every day | ORAL | Status: DC
  Administered 2022-10-14 – 2022-10-15 (×2): 2 mg via ORAL
  Filled 2022-10-13 (×5): qty 1

## 2022-10-13 MED ORDER — HYDROCODONE-ACETAMINOPHEN 5-325 MG PO TABS
2.0000 | ORAL_TABLET | Freq: Once | ORAL | Status: AC
  Administered 2022-10-13: 2 via ORAL
  Filled 2022-10-13: qty 2

## 2022-10-13 MED ORDER — GABAPENTIN 300 MG PO CAPS
300.0000 mg | ORAL_CAPSULE | Freq: Three times a day (TID) | ORAL | Status: DC
  Administered 2022-10-13 – 2022-10-17 (×13): 300 mg via ORAL
  Filled 2022-10-13 (×23): qty 1

## 2022-10-13 MED ORDER — DICLOFENAC SODIUM 1 % EX GEL
4.0000 g | Freq: Four times a day (QID) | CUTANEOUS | Status: DC | PRN
  Administered 2022-10-14 – 2022-10-15 (×4): 4 g via TOPICAL
  Filled 2022-10-13: qty 100

## 2022-10-13 NOTE — ED Notes (Signed)
Safe [facility] called 

## 2022-10-13 NOTE — ED Notes (Signed)
Report called to [facility] 
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
## 2022-10-13 NOTE — Group Note (Signed)
Date:  10/13/2022 Time:  11:34 AM  Group Topic/Focus:  Goals Group:   The focus of this group is to help patients establish daily goals to achieve during treatment and discuss how the patient can incorporate goal setting into their daily lives to aide in recovery. Orientation:   The focus of this group is to educate the patient on the purpose and policies of crisis stabilization and provide a format to answer questions about their admission.  The group details unit policies and expectations of patients while admitted.    Participation Level:  Active  Participation Quality:  Appropriate  Affect:  Appropriate  Cognitive:  Appropriate  Insight: Appropriate  Engagement in Group:  Engaged  Modes of Intervention:  Discussion, Orientation, and Rapport Building  Additional Comments:   Pt attended and actively participated in the Orientation/Goals group. Pt personal goal is "getting out the way".  Luis [name] 10/13/2022, 11:34 AM

## 2022-10-13 NOTE — ED Notes (Signed)
Called pharmacy to have 17:00 medications sent down to ED.

## 2022-10-13 NOTE — Progress Notes (Signed)
   10/13/22 0805  Psych Admission Type (Psych Patients Only)  Admission Status Voluntary  Psychosocial Assessment  Patient Complaints Anxiety;Depression;Irritability  Eye Contact Fair  Facial Expression Pained;Anxious  Affect Anxious;Irritable  Speech Logical/coherent  Interaction Assertive  Motor Activity Slow  Appearance/Hygiene Unremarkable  Behavior Characteristics Cooperative  Mood Depressed;Anxious  Thought Process  Coherency WDL  Content WDL  Delusions None reported or observed;WDL  Perception WDL  Hallucination None reported or observed  Judgment Impaired  Confusion WDL  Danger to Self  Current suicidal ideation? Denies  Self-Injurious Behavior No self-injurious ideation or behavior indicators observed or expressed   Agreement Not to Harm Self Yes  Description of Agreement verbal  Danger to Others  Danger to Others None reported or observed

## 2022-10-13 NOTE — Progress Notes (Addendum)
RN called Estate manager/land agent Luis [name].  Informed Luis [name] that pt fell, has a knot on his Right forehead from fall,  and pt is complaining of dizziness and blurry vision and nausea.  Pt's vitals  116/54, pulse 91.  Pt is alert and oriented.

## 2022-10-13 NOTE — Progress Notes (Signed)
Per patient request this RN called patient's sister [phone]) to update her that patient was taken to ED this afternoon after patient fell and hit his head. Sister verbalized understanding. Requests that patient call her when he returns to the unit.

## 2022-10-13 NOTE — Progress Notes (Signed)
   10/13/22 2130  Psych Admission Type (Psych Patients Only)  Admission Status Voluntary  Psychosocial Assessment  Patient Complaints Anxiety;Depression  Eye Contact Fair  Facial Expression Anxious;Angry  Affect Depressed;Flat  Speech Logical/coherent  Interaction Assertive  Motor Activity Slow  Appearance/Hygiene Unremarkable  Behavior Characteristics Cooperative  Mood Depressed;Anxious  Aggressive Behavior  Effect No apparent injury  Thought Process  Coherency WDL  Content WDL  Delusions None reported or observed  Perception WDL  Hallucination None reported or observed  Judgment Limited  Confusion None  Danger to Self  Current suicidal ideation? Denies  Danger to Others  Danger to Others None reported or observed

## 2022-10-13 NOTE — Group Note (Signed)
Recreation Therapy Group Note   Group Topic:Animal Assisted Therapy   Group Date: 10/13/2022 Start Time: 0950 End Time: 1030 Facilitators: Antino Mayabb-McCall, LRT,CTRS Location: [address] Dayroom   Animal-Assisted Activity (AAA) Program Checklist/Progress Notes Patient Eligibility Criteria Checklist & Daily Group note for Rec Tx Intervention  AAA/T Program Assumption of Risk Form signed by Patient/ or Parent Legal Guardian Yes  Patient understands his/her participation is voluntary Yes   Affect/Mood: N/A   Participation Level: Did not attend    Clinical Observations/Individualized Feedback:     Plan: Continue to engage patient in RT group sessions 2-3x/week.   Ignatz Deis-McCall, LRT,CTRS 10/13/2022 1:20 PM

## 2022-10-13 NOTE — ED Notes (Signed)
Safe-Transport arrival at ED.

## 2022-10-13 NOTE — Plan of Care (Addendum)
Went to see patient after being notified by nurse that patient back pain is so bad that he needs something strong for pain. Patient reported to this provider that he fell 30 min ago. He reports he did not tell staff. He reports he was getting up from the bench in his room and trying to use the walker but then the walker tipped over. He reports he hit his head on the floor as well. He is still oriented to self and situation. He reports a mild headache and complains of blurry vision. EOM intact. Discussed will send patient to ER, discussed with Dr. Theresia Lo.

## 2022-10-13 NOTE — ED Triage Notes (Signed)
Arrived via EMS, Loss balance, chronic lower back pain. Right forehead hematoma, no loss consciousness.
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
## 2022-10-13 NOTE — ED Notes (Signed)
Patient came from [facility] and they are holding a bed over at [facility] for patient to return once medically cleared.

## 2022-10-13 NOTE — B[facility] Group Notes (Signed)
Adult Psychoeducational Group Note  Date:  10/13/2022 Time:  10:30 PM  Group Topic/Focus:  Wrap-Up Group:   The focus of this group is to help patients review their daily goal of treatment and discuss progress on daily workbooks.  Participation Level:  Active  Participation Quality:  Appropriate  Affect:  Appropriate  Cognitive:  Appropriate  Insight: Appropriate  Engagement in Group:  Supportive  Modes of Intervention:  Discussion  Additional Comments:    Luis [name] 10/13/2022, 10:30 PM

## 2022-10-14 ENCOUNTER — Encounter (HOSPITAL_COMMUNITY): Payer: Self-pay

## 2022-10-14 LAB — GLUCOSE, CAPILLARY
Glucose-Capillary: 155 mg/dL — ABNORMAL HIGH (ref 70–99)
Glucose-Capillary: 221 mg/dL — ABNORMAL HIGH (ref 70–99)
Glucose-Capillary: 244 mg/dL — ABNORMAL HIGH (ref 70–99)
Glucose-Capillary: 297 mg/dL — ABNORMAL HIGH (ref 70–99)

## 2022-10-14 MED ORDER — HYDROCODONE-ACETAMINOPHEN 5-325 MG PO TABS
1.0000 | ORAL_TABLET | Freq: Two times a day (BID) | ORAL | Status: DC | PRN
  Administered 2022-10-14 – 2022-10-17 (×6): 1 via ORAL
  Filled 2022-10-14 (×6): qty 1

## 2022-10-14 MED ORDER — TRAZODONE HCL 150 MG PO TABS
300.0000 mg | ORAL_TABLET | Freq: Every evening | ORAL | Status: DC | PRN
  Administered 2022-10-14 – 2022-10-16 (×3): 300 mg via ORAL
  Filled 2022-10-14 (×3): qty 2

## 2022-10-14 NOTE — Group Note (Signed)
Recreation Therapy Group Note   Group Topic:Health and Wellness  Group Date: 10/14/2022 Start Time: 0935 End Time: 1009 Facilitators: Syanne Looney-McCall, LRT,CTRS Location: 300 Hall Dayroom   Goal Area(s) Addresses:   Patient will verbalize benefit of exercise during group session. Patient will verbalize an exercise that can be completed in their hospital room during admission. Patient will identify an exercise that can be completed post d/c.   Group Description:  Exercise.  LRT and patients took turns leading the group in various exercises.  Patients were to complete the exercises based on their physical abilities.  Patients could take breaks as needed and get water if needed.   Affect/Mood: Appropriate   Participation Level: Minimal   Participation Quality: Independent   Behavior: Appropriate   Speech/Thought Process: Focused   Insight: Good   Judgement: Good   Modes of Intervention: Music and Exercise   Patient Response to Interventions:  Attentive   Education Outcome:  In group clarification offered    Clinical Observations/Individualized Feedback: Pt didn't complete exercises but pt was engaged and social with peers.  Pt was appropriate throughout group.     Plan: Continue to engage patient in RT group sessions 2-3x/week.   Jonita Hirota-McCall, LRT,CTRS 10/14/2022 12:29 PM

## 2022-10-14 NOTE — B[facility] Group Notes (Signed)
Pt did not attend NA group 

## 2022-10-14 NOTE — Progress Notes (Signed)
Occupational Therapy Treatment Patient Details Name: Luis [name] MRN: [id number] DOB: 05/12/1971 Today's Date: 10/14/2022   History of present illness per H&P:Luis [name] is a 52 y.o., male with a past psychiatric history significant for recurrent, severe MDD, PTSD, and substance use disorder who presents to the [facility] voluntarily from [facility] for evaluation and management of Severe MDD with SI.   OT comments  Pt is found in hallway in [facility]. Pt is able to navigate back to room independently / pt states he had a fall yesterday when attempting to stand, LOB and fell forward hitting his head on the second bed frame in his room, this fall was apparently unwitnessed. He was able to remove himself from the floor and find his way to the nursing station to report the unwitnessed fall and ask for pain medications / all imaging reveals no acute trauma. Today pt is able to perform all transfers independently w/o LOB. Pt does admit feeling safer w/ RW. OT encourages pt to use RW several time daily in hallway to maintain progress in ambulation and balance made here at [facility]. OT will plan to follow pt per POC.    Recommendations for follow up therapy are one component of a multi-disciplinary discharge planning process, led by the attending physician.  Recommendations may be updated based on patient status, additional functional criteria and insurance authorization.    Assistance Recommended at Discharge    Patient can return home with the following  A little help with walking and/or transfers;A little help with bathing/dressing/bathroom;Other (comment)         Precautions / Restrictions Precautions Precautions: Fall Precaution Comments: Pt presents in [facility] today s/p reporting an unwitnessed fall in room on 10/13/2022. pt was cleared and all CT results were negative for any acute trauma / he presents in a WC today upon  arrival. Restrictions Weight Bearing Restrictions: No       Mobility Bed Mobility Overal bed mobility: Independent             General bed mobility comments: at [facility] / pt reports he was sent from ED in [facility]. States he feels "safer in [place] for the time being" OT encouraged pt to use RW as he performs all transfers and ambulation today w/o assist or LOB / complications / continues to present at baseline.    Transfers Overall transfer level: Independent                       Balance Overall balance assessment: Modified Independent                                         ADL either performed or assessed with clinical judgement   ADL Overall ADL's : At baseline                                              Cognition Arousal/Alertness: Awake/alert Behavior During Therapy: WFL for tasks assessed/performed Overall Cognitive Status: Within Functional Limits for tasks assessed  General Comments appears to be operating at baseline    Pertinent Vitals/ Pain       Pain Assessment Breathing: normal Negative Vocalization: none Facial Expression: smiling or inexpressive Body Language: relaxed Consolability: no need to console PAINAD Score: 0  Home Living Family/patient expects to be discharged to:: Private residence                                            Frequency  Min 2X/week        Progress Toward Goals  OT Goals(current goals can now be found in the care plan section)   Improving   Acute Rehab OT Goals Potential to Achieve Goals: Good   AM-PAC OT "6 Clicks" Daily Activity     Outcome Measure   Help from another person eating meals?: None Help from another person taking care of personal grooming?: None Help from another person toileting, which includes using toliet, bedpan, or urinal?: None Help from another  person bathing (including washing, rinsing, drying)?: None Help from another person to put on and taking off regular upper body clothing?: None Help from another person to put on and taking off regular lower body clothing?: A Little 6 Click Score: 23    End of Session Equipment Utilized During Treatment: Rolling walker (2 wheels)  OT Visit Diagnosis: Other abnormalities of gait and mobility (R26.89)   Activity Tolerance Patient tolerated treatment well   Patient Left in chair   Nurse Communication Mobility status        Time: 1610-9604 OT Time Calculation (min): 25 min  Charges: OT General Charges $OT Visit: 1 Visit OT Treatments $Self Care/Home Management : 8-22 mins  Kerrin Champagne, OT   Ted Mcalpine 10/14/2022, 7:52 PM

## 2022-10-14 NOTE — BH IP Treatment Plan (Addendum)
Interdisciplinary Treatment and Diagnostic Plan Update  10/14/2022 Time of Session: 9:50 AM ( UPDATE)  Luis [name] MRN: [id number]  Principal Diagnosis: MDD (major depressive disorder), recurrent severe, without psychosis  Secondary Diagnoses: Principal Problem:   MDD (major depressive disorder), recurrent severe, without psychosis   Current Medications:  Current Facility-Administered Medications  Medication Dose Route Frequency Provider Last Rate Last Admin   acetaminophen (TYLENOL) tablet 500 mg  500 mg Oral Q6H PRN Karie Fetch, MD       acetaminophen (TYLENOL) tablet 650 mg  650 mg Oral Q6H Karie Fetch, MD   650 mg at 10/14/22 0827   alum & mag hydroxide-simeth (MAALOX/MYLANTA) 200-200-20 MG/5ML suspension 30 mL  30 mL Oral Q4H PRN Ardis Hughs, NP       ARIPiprazole (ABILIFY) tablet 2 mg  2 mg Oral Daily Karie Fetch, MD   2 mg at 10/14/22 0829   carvedilol (COREG) tablet 3.125 mg  3.125 mg Oral BID WC Ardis Hughs, NP   3.125 mg at 10/14/22 4098   dapagliflozin propanediol (FARXIGA) tablet 10 mg  10 mg Oral Daily Ardis Hughs, NP   10 mg at 10/14/22 1191   diclofenac Sodium (VOLTAREN) 1 % topical gel 4 g  4 g Topical QID PRN Karie Fetch, MD   4 g at 10/14/22 0615   digoxin (LANOXIN) tablet 0.125 mg  0.125 mg Oral Daily Vernard Gambles H, NP   0.125 mg at 10/14/22 4782   diphenhydrAMINE (BENADRYL) capsule 50 mg  50 mg Oral TID PRN Starleen Blue, NP   50 mg at 10/11/22 9562   Or   diphenhydrAMINE (BENADRYL) injection 50 mg  50 mg Intramuscular TID PRN Starleen Blue, NP       DULoxetine (CYMBALTA) DR capsule 90 mg  90 mg Oral Daily Karie Fetch, MD   90 mg at 10/14/22 1308   gabapentin (NEURONTIN) capsule 300 mg  300 mg Oral TID Karie Fetch, MD   300 mg at 10/14/22 1206   gatifloxacin (ZYMAXID) 0.5 % ophthalmic drops 1 drop  1 drop Both Eyes QID Karie Fetch, MD   1 drop at 10/14/22 1206   haloperidol (HALDOL) tablet 5 mg  5  mg Oral TID PRN Ardis Hughs, NP       Or   haloperidol lactate (HALDOL) injection 5 mg  5 mg Intramuscular TID PRN Ardis Hughs, NP       HYDROcodone-acetaminophen (NORCO/VICODIN) 5-325 MG per tablet 1 tablet  1 tablet Oral Q12H PRN Karie Fetch, MD   1 tablet at 10/14/22 1046   hydrOXYzine (ATARAX) tablet 25 mg  25 mg Oral TID PRN Ardis Hughs, NP   25 mg at 10/14/22 0828   insulin aspart (novoLOG) injection 0-9 Units  0-9 Units Subcutaneous TID WC Ardis Hughs, NP   3 Units at 10/14/22 1207   insulin aspart protamine- aspart (NOVOLOG MIX 70/30) injection 60 Units  60 Units Subcutaneous Q supper Karie Fetch, MD       insulin aspart protamine- aspart (NOVOLOG MIX 70/30) injection 65 Units  65 Units Subcutaneous BID WC Karie Fetch, MD   65 Units at 10/14/22 1207   insulin glargine-yfgn (SEMGLEE) injection 20 Units  20 Units Subcutaneous QHS Ardis Hughs, NP   20 Units at 10/13/22 2115   LORazepam (ATIVAN) tablet 2 mg  2 mg Oral TID PRN Ardis Hughs, NP       Or   LORazepam (ATIVAN) injection 2 mg  2 mg Intramuscular TID PRN Ardis Hughs, NP       magnesium hydroxide (MILK OF MAGNESIA) suspension 30 mL  30 mL Oral Daily PRN Ardis Hughs, NP       melatonin tablet 3 mg  3 mg Oral QHS Karie Fetch, MD   3 mg at 10/13/22 2112   polyvinyl alcohol (LIQUIFILM TEARS) 1.4 % ophthalmic solution 1 drop  1 drop Both Eyes PRN Ardis Hughs, NP   1 drop at 10/13/22 2032   potassium chloride SA (KLOR-CON M) CR tablet 20 mEq  20 mEq Oral Daily Ardis Hughs, NP   20 mEq at 10/14/22 0981   prazosin (MINIPRESS) capsule 2 mg  2 mg Oral QHS Ardis Hughs, NP   2 mg at 10/13/22 2112   prednisoLONE acetate (PRED FORTE) 1 % ophthalmic suspension 1 drop  1 drop Both Eyes BID Karie Fetch, MD   1 drop at 10/14/22 0827   rosuvastatin (CRESTOR) tablet 20 mg  20 mg Oral Daily Ardis Hughs, NP   20 mg at 10/14/22 1914    sacubitril-valsartan (ENTRESTO) 97-103 mg per tablet  1 tablet Oral BID Ardis Hughs, NP   1 tablet at 10/14/22 7829   spironolactone (ALDACTONE) tablet 25 mg  25 mg Oral Daily Ardis Hughs, NP   25 mg at 10/14/22 5621   torsemide (DEMADEX) tablet 60 mg  60 mg Oral Daily Ardis Hughs, NP   60 mg at 10/14/22 0827   traZODone (DESYREL) tablet 300 mg  300 mg Oral QHS PRN Karie Fetch, MD       PTA Medications: Medications Prior to Admission  Medication Sig Dispense Refill Last Dose   apixaban (ELIQUIS) 5 MG TABS tablet Take 1 tablet (5 mg total) by mouth 2 (two) times daily. 60 tablet     carvedilol (COREG) 3.125 MG tablet Take 3.125 mg by mouth 2 (two) times daily with a meal.      dapagliflozin propanediol (FARXIGA) 10 MG TABS tablet Take 1 tablet (10 mg total) by mouth daily. 90 tablet 2    digoxin (LANOXIN) 0.125 MG tablet Take 0.125 mg by mouth daily.      hydrOXYzine (ATARAX) 25 MG tablet Take 1 tablet (25 mg total) by mouth 3 (three) times daily as needed for anxiety. 75 tablet 1    insulin aspart protamine- aspart (NOVOLOG MIX 70/30) (70-30) 100 UNIT/ML injection Inject 0.6 mLs (60 Units total) into the skin with breakfast, with lunch, and with evening meal. 10 mL 11    insulin glargine-yfgn (SEMGLEE) 100 UNIT/ML injection Inject 0.2 mLs (20 Units total) into the skin at bedtime. 10 mL 11    polyvinyl alcohol (LIQUIFILM TEARS) 1.4 % ophthalmic solution Place 1 drop into both eyes as needed for dry eyes. 15 mL 0    potassium chloride SA (KLOR-CON M) 20 MEQ tablet Take 20 mEq by mouth daily.      prazosin (MINIPRESS) 2 MG capsule Take 1 capsule (2 mg total) by mouth at bedtime. 30 capsule 1    rosuvastatin (CRESTOR) 20 MG tablet Take 1 tablet (20 mg total) by mouth daily. 90 tablet 2    sacubitril-valsartan (ENTRESTO) 97-103 MG Take 1 tablet by mouth 2 (two) times daily. 60 tablet     spironolactone (ALDACTONE) 25 MG tablet Take 1 tablet (25 mg total) by mouth daily.       torsemide (DEMADEX) 20 MG tablet Take 3 tablets (60 mg total) by  mouth daily. 270 tablet 2    traZODone (DESYREL) 100 MG tablet Take 2 tablets (200 mg total) by mouth at bedtime. 28 tablet 0     Patient Stressors:    Patient Strengths:    Treatment Modalities: Medication Management, Group therapy, Case management,  1 to 1 session with clinician, Psychoeducation, Recreational therapy.   Physician Treatment Plan for Primary Diagnosis: MDD (major depressive disorder), recurrent severe, without psychosis Long Term Goal(s): Improvement in symptoms so as ready for discharge   Short Term Goals: Ability to identify changes in lifestyle to reduce recurrence of condition will improve Ability to verbalize feelings will improve Ability to disclose and discuss suicidal ideas Ability to demonstrate self-control will improve Ability to identify and develop effective coping behaviors will improve  Medication Management: Evaluate patient's response, side effects, and tolerance of medication regimen.  Therapeutic Interventions: 1 to 1 sessions, Unit Group sessions and Medication administration.  Evaluation of Outcomes: Progressing  Physician Treatment Plan for Secondary Diagnosis: Principal Problem:   MDD (major depressive disorder), recurrent severe, without psychosis  Long Term Goal(s): Improvement in symptoms so as ready for discharge   Short Term Goals: Ability to identify changes in lifestyle to reduce recurrence of condition will improve Ability to verbalize feelings will improve Ability to disclose and discuss suicidal ideas Ability to demonstrate self-control will improve Ability to identify and develop effective coping behaviors will improve     Medication Management: Evaluate patient's response, side effects, and tolerance of medication regimen.  Therapeutic Interventions: 1 to 1 sessions, Unit Group sessions and Medication administration.  Evaluation of Outcomes:  Progressing   RN Treatment Plan for Primary Diagnosis: MDD (major depressive disorder), recurrent severe, without psychosis Long Term Goal(s): Knowledge of disease and therapeutic regimen to maintain health will improve  Short Term Goals: Ability to remain free from injury will improve, Ability to verbalize frustration and anger appropriately will improve, Ability to participate in decision making will improve, Ability to verbalize feelings will improve, Ability to identify and develop effective coping behaviors will improve, and Compliance with prescribed medications will improve  Medication Management: RN will administer medications as ordered by provider, will assess and evaluate patient's response and provide education to patient for prescribed medication. RN will report any adverse and/or side effects to prescribing provider.  Therapeutic Interventions: 1 on 1 counseling sessions, Psychoeducation, Medication administration, Evaluate responses to treatment, Monitor vital signs and CBGs as ordered, Perform/monitor CIWA, COWS, AIMS and Fall Risk screenings as ordered, Perform wound care treatments as ordered.  Evaluation of Outcomes: Progressing   LCSW Treatment Plan for Primary Diagnosis: MDD (major depressive disorder), recurrent severe, without psychosis Long Term Goal(s): Safe transition to appropriate next level of care at discharge, Engage patient in therapeutic group addressing interpersonal concerns.  Short Term Goals: Engage patient in aftercare planning with referrals and resources, Increase social support, Increase emotional regulation, Facilitate acceptance of mental health diagnosis and concerns, Identify triggers associated with mental health/substance abuse issues, and Increase skills for wellness and recovery  Therapeutic Interventions: Assess for all discharge needs, 1 to 1 time with Social worker, Explore available resources and support systems, Assess for adequacy in  community support network, Educate family and significant other(s) on suicide prevention, Complete Psychosocial Assessment, Interpersonal group therapy.  Evaluation of Outcomes: Progressing   Progress in Treatment: Attending groups: Yes. Participating in groups: Yes. Taking medication as prescribed: Yes. Toleration medication: Yes. Family/Significant other contact made: Yes, individual(s) contacted:  [name] ( ex) [phone]  Patient understands diagnosis:  Yes. Discussing patient identified problems/goals with staff: Yes. Medical problems stabilized or resolved: Yes. Denies suicidal/homicidal ideation: Yes. Issues/concerns per patient self-inventory: No.  New problem(s) identified: No, Describe:  none   New Short Term/Long Term Goal(s): Patient to work towards detox, elimination of symptoms of psychosis, medication management for mood stabilization; elimination of SI thoughts; development of comprehensive mental wellness/sobriety plan.   Patient Goals:  No additional goals identified at this time. Patient to continue to work towards original goals identified in initial treatment team meeting. CSW will remain available to patient should they voice additional treatment goals.    Discharge Plan or Barriers: No psychosocial barriers identified at this time, patient to return to place of residence when appropriate for discharge.    Reason for Continuation of Hospitalization: Delusions  Depression Medication stabilization   Estimated Length of Stay: 2-3 days   Last 3 [place] Suicide Severity Risk Score: Flowsheet Row ED to Hosp-Admission (Current) [facility] from 10[facility]05/2022 in [facility] ED from 10/03/2022 in [facility] Emergency Department [facility]  C-SSRS RISK CATEGORY Moderate Risk High Risk No Risk       Last PHQ 2/9 Scores:    03/30/2022    2:11 PM 09/03/2021    1:21 PM 08/05/2021     2:30 PM  Depression screen PHQ 2/9  Decreased Interest 3 1 2   Down, Depressed, Hopeless 3 1 3   PHQ - 2 Score 6 2 5   Altered sleeping  3 1  Tired, decreased energy  3 1  Change in appetite  0 1  Feeling bad or failure about yourself   0 2  Trouble concentrating  0 2  Moving slowly or fidgety/restless  0 1  Suicidal thoughts  0 2  PHQ-9 Score  8 15  Difficult doing work/chores  Extremely dIfficult Very difficult    Scribe for Treatment Team: Beather Arbour 10/14/2022 1:31 PM

## 2022-10-14 NOTE — Group Note (Signed)
Date:  10/14/2022 Time:  10:15 AM  Group Topic/Focus:  Goals Group:   The focus of this group is to help patients establish daily goals to achieve during treatment and discuss how the patient can incorporate goal setting into their daily lives to aide in recovery. Orientation:   The focus of this group is to educate the patient on the purpose and policies of crisis stabilization and provide a format to answer questions about their admission.  The group details unit policies and expectations of patients while admitted.    Participation Level:  Active  Luis [name] Luis [name] 10/14/2022, 10:15 AM

## 2022-10-14 NOTE — Progress Notes (Signed)
   10/14/22 0900  Psych Admission Type (Psych Patients Only)  Admission Status Voluntary  Psychosocial Assessment  Patient Complaints Anxiety;Depression  Eye Contact Fair  Facial Expression Anxious  Affect Irritable  Speech Logical/coherent  Interaction Assertive  Motor Activity Slow  Appearance/Hygiene Unremarkable  Behavior Characteristics Cooperative  Mood Depressed;Anxious  Thought Process  Coherency WDL  Content WDL  Delusions None reported or observed  Perception WDL  Hallucination None reported or observed  Judgment Limited  Confusion None  Danger to Self  Current suicidal ideation? Denies  Self-Injurious Behavior No self-injurious ideation or behavior indicators observed or expressed   Agreement Not to Harm Self Yes  Description of Agreement verbal  Danger to Others  Danger to Others None reported or observed

## 2022-10-14 NOTE — Inpatient Diabetes Management (Signed)
Inpatient Diabetes Program Recommendations  AACE/ADA: New Consensus Statement on Inpatient Glycemic Control (2015)  Target Ranges:  Prepandial:   less than 140 mg/dL      Peak postprandial:   less than 180 mg/dL (1-2 hours)      Critically ill patients:  140 - 180 mg/dL   Lab Results  Component Value Date   GLUCAP 221 (H) 10/14/2022   HGBA1C 9.2 (H) 10/05/2022    Review of Glycemic Control  Latest Reference Range & Units 10/13/22 05:43 10/13/22 12:04 10/13/22 17:09 10/13/22 20:24 10/14/22 05:45 10/14/22 11:37  Glucose-Capillary 70 - 99 mg/dL 032 (H) 122 (H) 482 (H) 151 (H) 155 (H) 221 (H)   Diabetes history: DM 2 Outpatient Diabetes medications: Farxiga 10 mg Daily, 70/30 60 units tid, Semglee 20 units qhs Current orders for Inpatient glycemic control:  Farxiga 10 mg Daily Semglee 20 units qhs Novolog 0-9 units tid 70/30 65 units before breakfast and lunch 70/30 60 units at dinnertime  Inpatient Diabetes Program Recommendations:    Glucose trends increase after PO intake still.  -   Consider increasing 70/30 insulin to 70 units before breakfast and lunch, leave dinnertime dose at 60 units  Thanks,  Christena Deem RN, MSN, BC-ADM Inpatient Diabetes Coordinator Team Pager [phone] (8a-5p)
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
## 2022-10-15 DIAGNOSIS — F332 Major depressive disorder, recurrent severe without psychotic features: Principal | ICD-10-CM

## 2022-10-15 LAB — GLUCOSE, CAPILLARY
Glucose-Capillary: 128 mg/dL — ABNORMAL HIGH (ref 70–99)
Glucose-Capillary: 254 mg/dL — ABNORMAL HIGH (ref 70–99)
Glucose-Capillary: 258 mg/dL — ABNORMAL HIGH (ref 70–99)
Glucose-Capillary: 142 mg/dL — ABNORMAL HIGH (ref 70–99)

## 2022-10-15 MED ORDER — ARIPIPRAZOLE 5 MG PO TABS
5.0000 mg | ORAL_TABLET | Freq: Every day | ORAL | Status: DC
  Administered 2022-10-16 – 2022-10-17 (×2): 5 mg via ORAL
  Filled 2022-10-15 (×4): qty 1

## 2022-10-15 NOTE — [facility] Group Notes (Signed)
Group focused on topic of strength. Group members reflected on what thoughts and feelings emerge when they hear this topic. They then engaged in facilitated dialog around how strength is present in their lives. This dialog focused on representing what strength had been to them in their lives (images and patterns given) and what they saw as helpful in their life now (what they needed / wanted).  Activity drew on narrative framework.   Patient did not attend group.

## 2022-10-15 NOTE — Progress Notes (Signed)
   10/14/22 2010  Psych Admission Type (Psych Patients Only)  Admission Status Voluntary  Psychosocial Assessment  Patient Complaints Anxiety;Depression  Eye Contact Fair  Facial Expression Anxious  Affect Appropriate to circumstance  Speech Logical/coherent  Interaction Assertive  Motor Activity Slow  Appearance/Hygiene Unremarkable  Behavior Characteristics Cooperative;Appropriate to situation  Mood Anxious  Thought Process  Coherency WDL  Content WDL  Delusions None reported or observed  Perception WDL  Hallucination None reported or observed  Judgment Poor  Confusion None  Danger to Self  Current suicidal ideation? Denies (Denies)  Self-Injurious Behavior  (No self -injurious behavior)  Agreement Not to Harm Self Yes  Description of Agreement Verbal  Danger to Others  Danger to Others None reported or observed

## 2022-10-15 NOTE — Progress Notes (Signed)
   10/15/22 0837  Psych Admission Type (Psych Patients Only)  Admission Status Voluntary  Psychosocial Assessment  Patient Complaints None  Eye Contact Fair  Facial Expression Flat  Affect Appropriate to circumstance  Speech Logical/coherent  Interaction Assertive  Motor Activity Slow  Appearance/Hygiene Unremarkable  Behavior Characteristics Cooperative  Mood Anxious  Thought Process  Coherency WDL  Content WDL  Delusions None reported or observed  Perception WDL  Hallucination None reported or observed  Judgment Limited  Confusion None  Danger to Self  Current suicidal ideation? Denies  Self-Injurious Behavior No self-injurious ideation or behavior indicators observed or expressed   Agreement Not to Harm Self Yes  Description of Agreement verbally contracts for safety  Danger to Others  Danger to Others None reported or observed

## 2022-10-15 NOTE — Group Note (Signed)
Date:  10/15/2022 Time:  12:19 PM  Group Topic/Focus:  Coping With Mental Health Crisis:   The purpose of this group is to help patients identify strategies for coping with mental health crisis.  Group discusses possible causes of crisis and ways to manage them effectively. Emotional Education:   The focus of this group is to discuss what feelings/emotions are, and how they are experienced.    Participation Level:  Active  Participation Quality:  Appropriate and Sharing  Affect:  Appropriate  Cognitive:  Appropriate  Insight: Appropriate  Engagement in Group:  Engaged  Modes of Intervention:  Clarification, Discussion, Exploration, and Support  Additional Comments:    Memory Dance Zyden Suman 10/15/2022, 12:19 PM

## 2022-10-15 NOTE — Inpatient Diabetes Management (Signed)
Inpatient Diabetes Program Recommendations  AACE/ADA: New Consensus Statement on Inpatient Glycemic Control (2015)  Target Ranges:  Prepandial:   less than 140 mg/dL      Peak postprandial:   less than 180 mg/dL (1-2 hours)      Critically ill patients:  140 - 180 mg/dL   Lab Results  Component Value Date   GLUCAP 128 (H) 10/15/2022   HGBA1C 9.2 (H) 10/05/2022    Review of Glycemic Control  Latest Reference Range & Units 10/14/22 05:45 10/14/22 11:37 10/14/22 16:51 10/14/22 20:42 10/15/22 05:52  Glucose-Capillary 70 - 99 mg/dL 320 (H) 233 (H) 435 (H) 297 (H) 128 (H)  (H): Data is abnormally high  Diabetes history: DM 2 Outpatient Diabetes medications: Farxiga 10 mg Daily, 70/30 60 units tid, Semglee 20 units qhs Current orders for Inpatient glycemic control:  Farxiga 10 mg Daily Semglee 20 units qhs Novolog 0-9 units tid 70/30 65 units before breakfast and lunch 70/30 60 units at dinnertime   Inpatient Diabetes Program Recommendations:     Consider increasing 70/30 insulin to 70 units before breakfast and lunch, leave dinnertime dose at 60 units  Will continue to follow while inpatient.  Thank you, Dulce Sellar, MSN, CDCES Diabetes Coordinator Inpatient Diabetes Program [phone] (team pager from 8a-5p)

## 2022-10-15 NOTE — Group Note (Signed)
Date:  10/15/2022 Time:  10:23 AM  Group Topic/Focus:  Orientation:   The focus of this group is to educate the patient on the purpose and policies of crisis stabilization and provide a format to answer questions about their admission.  The group details unit policies and expectations of patients while admitted.    Participation Level:  Active  Participation Quality:  Appropriate  Affect:  Appropriate  Cognitive:  Appropriate  Insight: Appropriate  Engagement in Group:  Engaged  Modes of Intervention:  Discussion  Additional Comments:     Reymundo Poll 10/15/2022, 10:23 AM

## 2022-10-15 NOTE — Plan of Care (Signed)
  Problem: Nutritional: Goal: Maintenance of adequate nutrition will improve Outcome: Progressing   Problem: Coping: Goal: Ability to demonstrate self-control will improve Outcome: Progressing   Problem: Safety: Goal: Periods of time without injury will increase Outcome: Progressing

## 2022-10-15 NOTE — Plan of Care (Signed)
  Problem: Coping: Goal: Ability to adjust to condition or change in health will improve Outcome: Progressing   Problem: Health Behavior/Discharge Planning: Goal: Ability to identify and utilize available resources and services will improve Outcome: Progressing Goal: Ability to manage health-related needs will improve Outcome: Progressing   Problem: Nutritional: Goal: Maintenance of adequate nutrition will improve Outcome: Progressing Goal: Progress toward achieving an optimal weight will improve Outcome: Progressing   Problem: Skin Integrity: Goal: Risk for impaired skin integrity will decrease Outcome: Progressing   Problem: Education: Goal: Knowledge of [facility] Education information/materials will improve Outcome: Progressing Goal: Emotional status will improve Outcome: Progressing Goal: Mental status will improve Outcome: Progressing   Problem: Activity: Goal: Interest or engagement in activities will improve Outcome: Progressing Goal: Sleeping patterns will improve Outcome: Progressing   Problem: Coping: Goal: Ability to verbalize frustrations and anger appropriately will improve Outcome: Progressing   Problem: Safety: Goal: Periods of time without injury will increase Outcome: Progressing
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
## 2022-10-15 NOTE — Group Note (Signed)
Date:  10/15/2022 Time:  10:24 AM  Group Topic/Focus:  Goals Group:   The focus of this group is to help patients establish daily goals to achieve during treatment and discuss how the patient can incorporate goal setting into their daily lives to aide in recovery.    Participation Level:  Active  Participation Quality:  Appropriate  Affect:  Appropriate  Cognitive:  Appropriate  Insight: Good  Engagement in Group:  Engaged  Modes of Intervention:  Exploration  Additional Comments:     Reymundo Poll 10/15/2022, 10:24 AM

## 2022-10-16 LAB — GLUCOSE, CAPILLARY
Glucose-Capillary: 128 mg/dL — ABNORMAL HIGH (ref 70–99)
Glucose-Capillary: 265 mg/dL — ABNORMAL HIGH (ref 70–99)
Glucose-Capillary: 172 mg/dL — ABNORMAL HIGH (ref 70–99)
Glucose-Capillary: 252 mg/dL — ABNORMAL HIGH (ref 70–99)

## 2022-10-16 MED ORDER — INSULIN ASPART PROT & ASPART (70-30 MIX) 100 UNIT/ML ~~LOC~~ SUSP
70.0000 [IU] | Freq: Two times a day (BID) | SUBCUTANEOUS | Status: DC
  Administered 2022-10-16 – 2022-10-17 (×3): 70 [IU] via SUBCUTANEOUS

## 2022-10-16 NOTE — [facility] Group Notes (Signed)
[facility] Group Notes:  (Nursing/MHT/Case Management/Adjunct)  Date:  10/16/2022  Time:  12:47 AM  Type of Therapy:   Wrap up  Participation Level:  Minimal  Participation Quality:  Appropriate  Affect:  Appropriate  Cognitive:  Appropriate  Insight:  Appropriate  Engagement in Group:  Engaged  Modes of Intervention:  Education  Summary of Progress/Problems: no goal. Day 5/10.  Luis [name] 10/16/2022, 12:47 AM

## 2022-10-16 NOTE — Progress Notes (Signed)
   10/16/22 1114  Psych Admission Type (Psych Patients Only)  Admission Status Involuntary  Psychosocial Assessment  Patient Complaints Depression  Eye Contact Fair  Facial Expression Flat  Affect Depressed;Flat  Speech Logical/coherent  Interaction Assertive  Motor Activity Unsteady  Appearance/Hygiene Unremarkable  Behavior Characteristics Cooperative;Appropriate to situation  Mood Depressed  Thought Process  Coherency WDL  Content Blaming others  Delusions None reported or observed  Perception WDL  Hallucination None reported or observed  Judgment Poor  Confusion None  Danger to Self  Current suicidal ideation? Denies  Self-Injurious Behavior No self-injurious ideation or behavior indicators observed or expressed   Agreement Not to Harm Self Yes  Description of Agreement verbal  Danger to Others  Danger to Others None reported or observed
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
## 2022-10-16 NOTE — Plan of Care (Signed)
Attempted to call [name] [phone] on 4/19 at 4:41pm x2, unable to reach.

## 2022-10-16 NOTE — BH IP Treatment Plan (Signed)
Interdisciplinary Treatment and Diagnostic Plan Update  10/16/2022 Time of Session: 1400 Luis [name] MRN: [id number]  Principal Diagnosis: MDD (major depressive disorder), recurrent severe, without psychosis  Secondary Diagnoses: Principal Problem:   MDD (major depressive disorder), recurrent severe, without psychosis   Current Medications:  Current Facility-Administered Medications  Medication Dose Route Frequency Provider Last Rate Last Admin   acetaminophen (TYLENOL) tablet 500 mg  500 mg Oral Q6H PRN Karie Fetch, MD       acetaminophen (TYLENOL) tablet 650 mg  650 mg Oral Q6H Karie Fetch, MD   650 mg at 10/14/22 1427   alum & mag hydroxide-simeth (MAALOX/MYLANTA) 200-200-20 MG/5ML suspension 30 mL  30 mL Oral Q4H PRN Ardis Hughs, NP   30 mL at 10/15/22 1655   ARIPiprazole (ABILIFY) tablet 5 mg  5 mg Oral Daily Armandina Stammer I, NP   5 mg at 10/16/22 0742   carvedilol (COREG) tablet 3.125 mg  3.125 mg Oral BID WC Ardis Hughs, NP   3.125 mg at 10/16/22 0743   dapagliflozin propanediol (FARXIGA) tablet 10 mg  10 mg Oral Daily Ardis Hughs, NP   10 mg at 10/16/22 0744   diclofenac Sodium (VOLTAREN) 1 % topical gel 4 g  4 g Topical QID PRN Karie Fetch, MD   4 g at 10/15/22 2136   digoxin (LANOXIN) tablet 0.125 mg  0.125 mg Oral Daily Vernard Gambles H, NP   0.125 mg at 10/16/22 0748   diphenhydrAMINE (BENADRYL) capsule 50 mg  50 mg Oral TID PRN Starleen Blue, NP   50 mg at 10/11/22 4098   Or   diphenhydrAMINE (BENADRYL) injection 50 mg  50 mg Intramuscular TID PRN Starleen Blue, NP       DULoxetine (CYMBALTA) DR capsule 90 mg  90 mg Oral Daily Karie Fetch, MD   90 mg at 10/16/22 0753   gabapentin (NEURONTIN) capsule 300 mg  300 mg Oral TID Karie Fetch, MD   300 mg at 10/16/22 1153   gatifloxacin (ZYMAXID) 0.5 % ophthalmic drops 1 drop  1 drop Both Eyes QID Karie Fetch, MD   1 drop at 10/16/22 1153   haloperidol (HALDOL) tablet 5  mg  5 mg Oral TID PRN Ardis Hughs, NP       Or   haloperidol lactate (HALDOL) injection 5 mg  5 mg Intramuscular TID PRN Ardis Hughs, NP       HYDROcodone-acetaminophen (NORCO/VICODIN) 5-325 MG per tablet 1 tablet  1 tablet Oral Q12H PRN Karie Fetch, MD   1 tablet at 10/15/22 2129   hydrOXYzine (ATARAX) tablet 25 mg  25 mg Oral TID PRN Ardis Hughs, NP   25 mg at 10/14/22 0828   insulin aspart (novoLOG) injection 0-9 Units  0-9 Units Subcutaneous TID WC Ardis Hughs, NP   5 Units at 10/16/22 1253   insulin aspart protamine- aspart (NOVOLOG MIX 70/30) injection 60 Units  60 Units Subcutaneous Q supper Karie Fetch, MD   60 Units at 10/15/22 1723   insulin aspart protamine- aspart (NOVOLOG MIX 70/30) injection 70 Units  70 Units Subcutaneous BID WC Karie Fetch, MD   70 Units at 10/16/22 1255   insulin glargine-yfgn (SEMGLEE) injection 20 Units  20 Units Subcutaneous QHS Ardis Hughs, NP   20 Units at 10/15/22 2131   LORazepam (ATIVAN) tablet 2 mg  2 mg Oral TID PRN Ardis Hughs, NP       Or   LORazepam (ATIVAN)  injection 2 mg  2 mg Intramuscular TID PRN Ardis Hughs, NP       magnesium hydroxide (MILK OF MAGNESIA) suspension 30 mL  30 mL Oral Daily PRN Ardis Hughs, NP       melatonin tablet 3 mg  3 mg Oral QHS Karie Fetch, MD   3 mg at 10/15/22 0155   polyvinyl alcohol (LIQUIFILM TEARS) 1.4 % ophthalmic solution 1 drop  1 drop Both Eyes PRN Ardis Hughs, NP   1 drop at 10/13/22 2032   potassium chloride SA (KLOR-CON M) CR tablet 20 mEq  20 mEq Oral Daily Ardis Hughs, NP   20 mEq at 10/16/22 0748   prazosin (MINIPRESS) capsule 2 mg  2 mg Oral QHS Ardis Hughs, NP   2 mg at 10/15/22 2129   prednisoLONE acetate (PRED FORTE) 1 % ophthalmic suspension 1 drop  1 drop Both Eyes BID Karie Fetch, MD   1 drop at 10/16/22 0750   rosuvastatin (CRESTOR) tablet 20 mg  20 mg Oral Daily Ardis Hughs, NP   20 mg at  10/16/22 0749   sacubitril-valsartan (ENTRESTO) 97-103 mg per tablet  1 tablet Oral BID Ardis Hughs, NP   1 tablet at 10/16/22 1006   spironolactone (ALDACTONE) tablet 25 mg  25 mg Oral Daily Ardis Hughs, NP   25 mg at 10/16/22 0751   torsemide (DEMADEX) tablet 60 mg  60 mg Oral Daily Ardis Hughs, NP   60 mg at 10/16/22 0751   traZODone (DESYREL) tablet 300 mg  300 mg Oral QHS PRN Karie Fetch, MD   300 mg at 10/15/22 2130   PTA Medications: Medications Prior to Admission  Medication Sig Dispense Refill Last Dose   apixaban (ELIQUIS) 5 MG TABS tablet Take 1 tablet (5 mg total) by mouth 2 (two) times daily. 60 tablet     carvedilol (COREG) 3.125 MG tablet Take 3.125 mg by mouth 2 (two) times daily with a meal.      dapagliflozin propanediol (FARXIGA) 10 MG TABS tablet Take 1 tablet (10 mg total) by mouth daily. 90 tablet 2    digoxin (LANOXIN) 0.125 MG tablet Take 0.125 mg by mouth daily.      hydrOXYzine (ATARAX) 25 MG tablet Take 1 tablet (25 mg total) by mouth 3 (three) times daily as needed for anxiety. 75 tablet 1    insulin aspart protamine- aspart (NOVOLOG MIX 70/30) (70-30) 100 UNIT/ML injection Inject 0.6 mLs (60 Units total) into the skin with breakfast, with lunch, and with evening meal. 10 mL 11    insulin glargine-yfgn (SEMGLEE) 100 UNIT/ML injection Inject 0.2 mLs (20 Units total) into the skin at bedtime. 10 mL 11    polyvinyl alcohol (LIQUIFILM TEARS) 1.4 % ophthalmic solution Place 1 drop into both eyes as needed for dry eyes. 15 mL 0    potassium chloride SA (KLOR-CON M) 20 MEQ tablet Take 20 mEq by mouth daily.      prazosin (MINIPRESS) 2 MG capsule Take 1 capsule (2 mg total) by mouth at bedtime. 30 capsule 1    rosuvastatin (CRESTOR) 20 MG tablet Take 1 tablet (20 mg total) by mouth daily. 90 tablet 2    sacubitril-valsartan (ENTRESTO) 97-103 MG Take 1 tablet by mouth 2 (two) times daily. 60 tablet     spironolactone (ALDACTONE) 25 MG tablet Take 1  tablet (25 mg total) by mouth daily.      torsemide (DEMADEX) 20 MG tablet  Take 3 tablets (60 mg total) by mouth daily. 270 tablet 2    traZODone (DESYREL) 100 MG tablet Take 2 tablets (200 mg total) by mouth at bedtime. 28 tablet 0     Patient Stressors:    Patient Strengths:    Treatment Modalities: Medication Management, Group therapy, Case management,  1 to 1 session with clinician, Psychoeducation, Recreational therapy.   Physician Treatment Plan for Primary Diagnosis: MDD (major depressive disorder), recurrent severe, without psychosis Long Term Goal(s): Improvement in symptoms so as ready for discharge   Short Term Goals: Ability to identify changes in lifestyle to reduce recurrence of condition will improve Ability to verbalize feelings will improve Ability to disclose and discuss suicidal ideas Ability to demonstrate self-control will improve Ability to identify and develop effective coping behaviors will improve  Medication Management: Evaluate patient's response, side effects, and tolerance of medication regimen.  Therapeutic Interventions: 1 to 1 sessions, Unit Group sessions and Medication administration.  Evaluation of Outcomes: Progressing  Physician Treatment Plan for Secondary Diagnosis: Principal Problem:   MDD (major depressive disorder), recurrent severe, without psychosis  Long Term Goal(s): Improvement in symptoms so as ready for discharge   Short Term Goals: Ability to identify changes in lifestyle to reduce recurrence of condition will improve Ability to verbalize feelings will improve Ability to disclose and discuss suicidal ideas Ability to demonstrate self-control will improve Ability to identify and develop effective coping behaviors will improve     Medication Management: Evaluate patient's response, side effects, and tolerance of medication regimen.  Therapeutic Interventions: 1 to 1 sessions, Unit Group sessions and Medication  administration.  Evaluation of Outcomes: Progressing   RN Treatment Plan for Primary Diagnosis: MDD (major depressive disorder), recurrent severe, without psychosis Long Term Goal(s): Knowledge of disease and therapeutic regimen to maintain health will improve  Short Term Goals: Ability to remain free from injury will improve, Ability to verbalize frustration and anger appropriately will improve, Ability to demonstrate self-control, Ability to participate in decision making will improve, Ability to verbalize feelings will improve, Ability to disclose and discuss suicidal ideas, Ability to identify and develop effective coping behaviors will improve, and Compliance with prescribed medications will improve  Medication Management: RN will administer medications as ordered by provider, will assess and evaluate patient's response and provide education to patient for prescribed medication. RN will report any adverse and/or side effects to prescribing provider.  Therapeutic Interventions: 1 on 1 counseling sessions, Psychoeducation, Medication administration, Evaluate responses to treatment, Monitor vital signs and CBGs as ordered, Perform/monitor CIWA, COWS, AIMS and Fall Risk screenings as ordered, Perform wound care treatments as ordered.  Evaluation of Outcomes: Progressing   LCSW Treatment Plan for Primary Diagnosis: MDD (major depressive disorder), recurrent severe, without psychosis Long Term Goal(s): Safe transition to appropriate next level of care at discharge, Engage patient in therapeutic group addressing interpersonal concerns.  Short Term Goals: Engage patient in aftercare planning with referrals and resources, Increase social support, Increase ability to appropriately verbalize feelings, Increase emotional regulation, Facilitate acceptance of mental health diagnosis and concerns, Facilitate patient progression through stages of change regarding substance use diagnoses and concerns,  Identify triggers associated with mental health/substance abuse issues, and Increase skills for wellness and recovery  Therapeutic Interventions: Assess for all discharge needs, 1 to 1 time with Social worker, Explore available resources and support systems, Assess for adequacy in community support network, Educate family and significant other(s) on suicide prevention, Complete Psychosocial Assessment, Interpersonal group therapy.  Evaluation of Outcomes:  Progressing   Progress in Treatment: Attending groups: Yes. Participating in groups: Yes. Taking medication as prescribed: Yes. Toleration medication: Yes. Family/Significant other contact made: Yes, individual(s) contacted:  -[name] ( [phone]  Patient understands diagnosis: Yes. Discussing patient identified problems/goals with staff: Yes. Medical problems stabilized or resolved: Yes. Denies suicidal/homicidal ideation: Yes. Issues/concerns per patient self-inventory: Yes. Other: N/A  New problem(s) identified: No, Describe:  None Reported  New Short Term/Long Term Goal(s): ): medication stabilization, elimination of SI thoughts, development of comprehensive mental wellness plan.   Patient Goals:  Medication Management  Discharge Plan or Barriers: CSW will continue to follow and assess for appropriate referrals and possible discharge planning.   Reason for Continuation of Hospitalization: Depression Medication stabilization  Estimated Length of Stay: 3-7 Days  Last 3 [place] Suicide Severity Risk Score: Flowsheet Row ED to Hosp-Admission (Current) [facility] from 10/05/2022 in [facility] ED from 10/03/2022 in [facility] Emergency Department at [facility]  C-SSRS RISK CATEGORY Moderate Risk High Risk No Risk       Last PHQ 2/9 Scores:    03/30/2022    2:11 PM 09/03/2021    1:21 PM 08/05/2021    2:30 PM  Depression screen PHQ  2/9  Decreased Interest Down, Depressed, Hopeless PHQ - 2 Score Altered sleeping  3 1  Tired, decreased energy  3 1  Change in appetite  0 1  Feeling bad or failure about yourself   0 2  Trouble concentrating  0 2  Moving slowly or fidgety/restless  0 1  Suicidal thoughts  0 2  PHQ-9 Score  8 15  Difficult doing work/chores  Extremely dIfficult Very difficult    medication stabilization, elimination of SI thoughts, development of comprehensive mental wellness plan.   Scribe for Treatment Team: Ane Payment, LCSW 10/16/2022 4:30 PM

## 2022-10-16 NOTE — Group Note (Signed)
Recreation Therapy Group Note   Group Topic:Problem Solving  Group Date: 10/16/2022 Start Time: 1610 End Time: 1000 Facilitators: Paisly Fingerhut-McCall, LRT,CTRS Location: [address] Dayroom   Goal Area(s) Addresses:  Patient will effectively work with peer towards shared goal.  Patient will identify skills used to make activity successful.  Patient will identify how skills used during activity can be applied to reach post d/c goals.   Group Description: [employer]. In teams of 5-6, patients were given 11 craft pipe cleaners. Using the materials provided, patients were instructed to compete again the opposing team(s) to build the tallest free-standing structure from floor level. The activity was timed; difficulty increased by Clinical research associate as Production designer, theatre/television/film continued.  Systematically resources were removed with additional directions for example, placing one arm behind their back, working in silence, and shape stipulations. LRT facilitated post-activity discussion reviewing team processes and necessary communication skills involved in completion. Patients were encouraged to reflect how the skills utilized, or not utilized, in this activity can be incorporated to positively impact support systems post discharge.   Affect/Mood: Appropriate   Participation Level: None   Participation Quality: N/A   Behavior: Attentive    Speech/Thought Process: N/A   Insight: N/A   Judgement: N/A   Modes of Intervention: Problem-solving   Patient Response to Interventions:  Attentive   Education Outcome:  In group clarification offered    Clinical Observations/Individualized Feedback: Pt did not participate in group session.  Pt was observant throughout group.     Plan: Continue to engage patient in RT group sessions 2-3x/week.   Reily Ilic-McCall, LRT,CTRS 10/16/2022 12:46 PM

## 2022-10-16 NOTE — Progress Notes (Signed)
   10/15/22 2100  Psych Admission Type (Psych Patients Only)  Admission Status Voluntary  Psychosocial Assessment  Patient Complaints Agitation;Anger;Anhedonia  Eye Contact Fair  Facial Expression Anxious  Affect Appropriate to circumstance  Speech Logical/coherent  Interaction Assertive  Motor Activity Slow  Appearance/Hygiene Unremarkable  Behavior Characteristics Cooperative;Appropriate to situation  Thought Process  Coherency WDL  Content WDL  Delusions None reported or observed  Perception WDL  Hallucination None reported or observed  Judgment Poor  Confusion None  Danger to Self  Current suicidal ideation? Denies (Denies)  Self-Injurious Behavior  (No self -injurious behavior)  Agreement Not to Harm Self Yes  Description of Agreement Verbal  Danger to Others  Danger to Others None reported or observed

## 2022-10-17 LAB — GLUCOSE, CAPILLARY
Glucose-Capillary: 133 mg/dL — ABNORMAL HIGH (ref 70–99)
Glucose-Capillary: 205 mg/dL — ABNORMAL HIGH (ref 70–99)

## 2022-10-17 MED ORDER — DICLOFENAC SODIUM 1 % EX GEL: 4.0000 g | Freq: Four times a day (QID) | CUTANEOUS | 0 refills | Status: AC | PRN

## 2022-10-17 MED ORDER — INSULIN ASPART PROT & ASPART (70-30 MIX) 100 UNIT/ML ~~LOC~~ SUSP: 70.0000 [IU] | Freq: Two times a day (BID) | SUBCUTANEOUS | 0 refills | Status: DC

## 2022-10-17 MED ORDER — GATIFLOXACIN 0.5 % OP SOLN: 1.0000 [drp] | Freq: Four times a day (QID) | OPHTHALMIC | 0 refills | Status: AC

## 2022-10-17 MED ORDER — TRAZODONE HCL 300 MG PO TABS: 300.0000 mg | ORAL_TABLET | Freq: Every evening | ORAL | 0 refills | Status: DC | PRN

## 2022-10-17 MED ORDER — PREDNISOLONE ACETATE 1 % OP SUSP: 1.0000 [drp] | Freq: Two times a day (BID) | OPHTHALMIC | 0 refills | Status: AC

## 2022-10-17 MED ORDER — MELATONIN 3 MG PO TABS: 3.0000 mg | ORAL_TABLET | Freq: Every day | ORAL | 0 refills | Status: AC

## 2022-10-17 MED ORDER — GABAPENTIN 300 MG PO CAPS: 300.0000 mg | ORAL_CAPSULE | Freq: Three times a day (TID) | ORAL | 0 refills | Status: DC

## 2022-10-17 MED ORDER — DULOXETINE HCL 30 MG PO CPEP: 90.0000 mg | ORAL_CAPSULE | Freq: Every day | ORAL | 0 refills | Status: DC

## 2022-10-17 MED ORDER — ARIPIPRAZOLE 5 MG PO TABS: 5.0000 mg | ORAL_TABLET | Freq: Every day | ORAL | 0 refills | Status: DC

## 2022-10-17 MED ORDER — INSULIN ASPART PROT & ASPART (70-30 MIX) 100 UNIT/ML ~~LOC~~ SUSP: 60.0000 [IU] | Freq: Every day | SUBCUTANEOUS | 0 refills | Status: DC

## 2022-10-17 NOTE — Progress Notes (Signed)
   10/16/22 2135  Psych Admission Type (Psych Patients Only)  Admission Status Voluntary  Psychosocial Assessment  Patient Complaints Anxiety;Depression  Eye Contact Fair  Facial Expression Flat  Affect Anxious;Irritable  Speech Logical/coherent  Interaction Assertive;Demanding  Motor Activity Slow  Appearance/Hygiene Unremarkable  Behavior Characteristics Cooperative  Mood Depressed;Anxious  Thought Process  Coherency WDL  Content Blaming others  Delusions None reported or observed  Perception WDL  Hallucination None reported or observed  Judgment Impaired  Confusion None  Danger to Self  Current suicidal ideation? Denies  Self-Injurious Behavior No self-injurious ideation or behavior indicators observed or expressed   Agreement Not to Harm Self Yes  Description of Agreement Verbal contract for safety   Pt reported 10/10 anxiety and depression. Pt was offered support and encouragement. Pt reported 10/10 back pain, given PRN Hydrocodone-acetaminophen per MAR. Pt was given scheduled medications and PRN Trazodone per MAR. Q 15 minute checks were done for safety. Pt attended group. Pt has no complaints.Pt receptive to treatment and safety maintained on unit.
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
## 2022-10-17 NOTE — Plan of Care (Signed)
  Problem: Safety: Goal: Periods of time without injury will increase Outcome: Progressing   
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
## 2022-10-17 NOTE — Plan of Care (Addendum)
Attempted to call Luis [name] again at 7:11am on 4/20, no response.   Attempted again at 9:39am on 4/20, no response.

## 2022-10-17 NOTE — Progress Notes (Signed)
   10/17/22 0918  Psych Admission Type (Psych Patients Only)  Admission Status Voluntary  Psychosocial Assessment  Patient Complaints Anxiety;Depression  Eye Contact Fair  Facial Expression Flat  Affect Depressed  Speech Logical/coherent  Interaction Assertive;Demanding  Motor Activity Slow;Unsteady  Appearance/Hygiene Unremarkable  Behavior Characteristics Cooperative;Appropriate to situation  Mood Depressed;Anxious  Thought Process  Coherency WDL  Content Blaming others  Delusions None reported or observed  Perception WDL  Hallucination None reported or observed  Judgment Impaired  Confusion None  Danger to Self  Current suicidal ideation? Denies  Self-Injurious Behavior No self-injurious ideation or behavior indicators observed or expressed   Agreement Not to Harm Self Yes  Description of Agreement verbal  Danger to Others  Danger to Others None reported or observed

## 2022-10-17 NOTE — Progress Notes (Signed)
Patient discharged. Reviewed discharge instructions. Patient verbalized understanding. Patient received all personal belongings including home medications.

## 2022-10-17 NOTE — [facility] Suicide Risk Assessment (Addendum)
[facility] Discharge Suicide Risk Assessment  Principal Problem: MDD (major depressive disorder), recurrent severe, without psychosis Discharge Diagnoses: Principal Problem:   MDD (major depressive disorder), recurrent severe, without psychosis  Reason for admission: [name] is a 52 y.o. male with past psychiatric history significant for recurrent, severe MDD, PTSD, and substance use disorder who presents to the [facility] voluntarily from [facility] for evaluation and management of Severe MDD with SI.   PTA Medications:  Current Outpatient Medications  Medication Instructions   apixaban (ELIQUIS) 5 mg, Oral, 2 times daily   [START ON 10/18/2022] ARIPiprazole (ABILIFY) 5 mg, Oral, Daily   carvedilol (COREG) 3.125 mg, Oral, 2 times daily with meals   dapagliflozin propanediol (FARXIGA) 10 mg, Oral, Daily   diclofenac Sodium (VOLTAREN) 4 g, Topical, 4 times daily PRN   digoxin (LANOXIN) 0.125 mg, Oral, Daily   [START ON 10/18/2022] DULoxetine (CYMBALTA) 90 mg, Oral, Daily   gabapentin (NEURONTIN) 300 mg, Oral, 3 times daily   gatifloxacin (ZYMAXID) 0.5 % SOLN 1 drop, Both Eyes, 4 times daily   hydrOXYzine (ATARAX) 25 mg, Oral, 3 times daily PRN   insulin aspart protamine- aspart (NOVOLOG MIX 70/30) (70-30) 100 UNIT/ML injection 60 Units, Subcutaneous, 3 times daily with meals   insulin aspart protamine- aspart (NOVOLOG MIX 70/30) (70-30) 100 UNIT/ML injection 70 Units, Subcutaneous, 2 times daily with breakfast and lunch   insulin aspart protamine- aspart (NOVOLOG MIX 70/30) (70-30) 100 UNIT/ML injection 60 Units, Subcutaneous, Daily with supper   insulin glargine-yfgn (SEMGLEE) 20 Units, Subcutaneous, Daily at bedtime   melatonin 3 mg, Oral, Daily at bedtime   polyvinyl alcohol (LIQUIFILM TEARS) 1.4 % ophthalmic solution 1 drop, Both Eyes, As needed   potassium chloride SA (KLOR-CON M) 20 MEQ tablet 20 mEq, Oral, Daily   prazosin  (MINIPRESS) 2 mg, Oral, Daily at bedtime   prednisoLONE acetate (PRED FORTE) 1 % ophthalmic suspension 1 drop, Both Eyes, 2 times daily   rosuvastatin (CRESTOR) 20 mg, Oral, Daily   sacubitril-valsartan (ENTRESTO) 97-103 MG 1 tablet, Oral, 2 times daily   spironolactone (ALDACTONE) 25 mg, Oral, Daily   torsemide (DEMADEX) 60 mg, Oral, Daily   traZODone (DESYREL) 200 mg, Oral, Daily at bedtime   trazodone (DESYREL) 300 mg, Oral, At bedtime PRN    Hospital Course:   During the patient's hospitalization, patient had extensive initial psychiatric evaluation, and follow-up psychiatric evaluations every day.  Psychiatric diagnoses provided upon initial assessment:  -Major Depressive Disorder -PTSD   Patient's psychiatric medications were adjusted on admission:  -Started on duloxetine DR 30mg  for depression, anxiety, and chronic pain management  -Started on prazosin 2mg  QHS for PTSD   During the hospitalization, other adjustments were made to the patient's psychiatric medication regimen:  -Duloxetine DR increased to 90mg  for depression, anxiety, and chronic pain management  -Started on abilify 5mg  daily to augment depression  -He was given norco/vicoden PRN during admission to address chronic pain, discussed he was not going to get this medication on discharge and to follow-up with chronic pain clinic   -For his DM, he was followed by the DM coordinator. His novolog was increased to 70/70/60. He was continued on SSI and his home dose of semglee.  -For his CHFrEF, reached out to HF team (Dr. Allyson Sabal) regarding his medications. Recommended restarting his HF medications but discontinue his eliquis given it was provoked DVT and he has clean coronaries so no need for plavix. He was continued on his  GDMT.   Labs notable for:  EKG monitoring: QTc: 497  UDS+cocaine, oxycodone, marijuana  TSH 0.574 (wnl) Lipid panel TG 154 Ethanol <10 Mg 2.1  A1c 9.2 CMP wnl  Patient's care was discussed during  the interdisciplinary team meeting every day during the hospitalization.  The patient denied having side effects to prescribed psychiatric medication.  Gradually, patient started adjusting to milieu. The patient was evaluated each day by a clinical provider to ascertain response to treatment. Improvement was noted by the patient's report of decreasing symptoms, improved sleep and appetite, affect, medication tolerance, behavior, and participation in unit programming.  Patient was asked each day to complete a self inventory noting mood, mental status, pain, new symptoms, anxiety and concerns.    Symptoms were reported as significantly decreased or resolved completely by discharge.   On day of discharge, the patient reports that their mood is stable. The patient denied having suicidal thoughts for more than 48 hours prior to discharge.  Patient denies having homicidal thoughts.  Patient denies having auditory hallucinations.  Patient denies any visual hallucinations or other symptoms of psychosis. The patient was motivated to continue taking medication with a goal of continued improvement in mental health.   The patient reports their target psychiatric symptoms of depression responded well to the psychiatric medications, and the patient reports overall benefit other psychiatric hospitalization. Supportive psychotherapy was provided to the patient. The patient also participated in regular group therapy while hospitalized. Coping skills, problem solving as well as relaxation therapies were also part of the unit programming.  Labs were reviewed with the patient, and abnormal results were discussed with the patient.  The patient is able to verbalize their individual safety plan to this provider.  Behavioral Events: none  Restraints: none   Groups: attended groups   Sleep  Sleep: Reports fair   Musculoskeletal: Strength & Muscle Tone: within normal limits Gait & Station: shuffle Patient leans:  N/A  Psychiatric Specialty Exam General Appearance: appears older than stated age.     Behavior: cooperative   Psychomotor Activity:Mild psychomotor retardation noted    Eye Contact: fair Speech: monotone, low volume and speed      Mood: "good"   Affect: congruent, constricted    Thought Process: logical, linear  Descriptions of Associations: intact Thought Content: Denies AVH, paranoia, ideas of reference, first rank symptoms and is not grossly responding to internal/external stimuli on exam  Hallucinations: denies AH, VH Delusions: No delusions noted   Suicidal Thoughts: Denies current active SI and contracts for safety  Homicidal Thoughts: denies HI    Alertness/Orientation: alert and oriented to person, time, place, situation    Insight: fair Judgment: fair  Memory: intact   Executive Functions  Concentration: intact Attention Span: fair Recall:  intact Fund of Knowledge: fair    Assets: Manufacturing systems engineer; Desire for Improvement; Housing  Physical Exam: Constitutional:      Appearance: the patient is not toxic-appearing.  Pulmonary:     Effort: Pulmonary effort is normal.  Neurological:     General: No focal deficit present.     Mental Status: the patient is alert and oriented to person, place, and time.   Review of Systems  Respiratory:  Negative for shortness of breath.   Cardiovascular:  Negative for chest pain.  Gastrointestinal:  Negative for abdominal pain, constipation, diarrhea, nausea and vomiting.  Neurological:  Negative for headaches.  MSK: positive for chronic back pain  Blood pressure 134/78, pulse 91, temperature 97.9 F (36.6 C), temperature  source Oral, resp. rate 20, height 5' 5.5" (1.664 m), weight 130.2 kg, SpO2 96 %. Body mass index is 47.03 kg/m.  Mental Status Per Nursing Assessment::   On Admission:  Suicidal ideation indicated by patient  Demographic Factors:  Male  Loss Factors: Loss of significant relationship,  Decline in physical health, and Financial problems/change in socioeconomic status  Historical Factors: Prior suicide attempts and Impulsivity  Risk Reduction Factors:   Sense of responsibility to family, Positive social support, and Positive coping skills or problem solving skills  Continued Clinical Symptoms:  Depression Alcohol/Substance Abuse/Dependencies Chronic Pain Medical Diagnoses and Treatments/Surgeries  Cognitive Features That Contribute To Risk:  Loss of executive function    Suicide Risk:  Mild: There are no identifiable plans, no associated intent, mild dysphoria and related symptoms, good self-control (both objective and subjective assessment), few other risk factors, and identifiable protective factors, including available and accessible social support.   Follow-up Information     [facility] Follow up on 10/19/2022.   Why: You have a nurse visit/phone call on 10/19/22 before 2PM.  You also have a hospital follow up appointment to obtain therapy and medication management services on  10/28/22 at 1:30 pm.  This appointment will be held in person.   You will also be referred to a substance abuse intensive outpatient therapy program. Contact information: [address] [place] [address] [phone]         Mia Creek, MD. Schedule an appointment as soon as possible for a visit in 3 day(s).   Specialty: Ophthalmology Contact information: [address] [place] [address] [phone]                 Discharge recommendations:   Activity: as tolerated  Diet: heart healthy  # It is recommended to the patient to continue psychiatric medications as prescribed, after discharge from the hospital.     # It is recommended to the patient to follow up with your outpatient psychiatric provider -instructions on appointment date, time, and address (location) are provided to you in discharge paperwork  #  Follow-up with outpatient primary care doctor and other specialists -for management of chronic medical disease, including:  -Chronic Pain, OSA, T2DM, CHFrEF, HTN, HLD -Follow-up with ophthalmologist   # Testing: Follow-up with outpatient provider for abnormal lab results:  UDS+cocaine, oxycodone, marijuana  Lipid panel TG 154 A1c 9.2  # It was discussed with the patient, the impact of alcohol, drugs, tobacco have been there overall psychiatric and medical wellbeing, and total abstinence from substance use was recommended to the patient.   # Prescriptions provided or sent directly to preferred pharmacy at discharge. Patient agreeable to plan. Given opportunity to ask questions. Appears to feel comfortable with discharge.    # In the event of worsening symptoms, the patient is instructed to call the crisis hotline, 911 and or go to the nearest ED for appropriate evaluation and treatment of symptoms. To follow-up with primary care provider for other medical issues, concerns and or health care needs  Patient agrees with D/C instructions and plan.   Total Time Spent in Direct Patient Care:  I personally spent 30 minutes on the unit in direct patient care. The direct patient care time included face-to-face time with the patient, reviewing the patient's chart, communicating with other professionals, and coordinating care. Greater than 50% of this time was spent in counseling or coordinating care with the patient regarding goals of hospitalization, psycho-education, and discharge  planning needs.   Karie Fetch, MD, PGY-1 10/17/2022, 9:19 AM

## 2022-10-25 ENCOUNTER — Encounter (HOSPITAL_COMMUNITY): Payer: Self-pay | Admitting: Student in an Organized Health Care Education/Training Program

## 2022-10-25 ENCOUNTER — Observation Stay (HOSPITAL_COMMUNITY)
Admission: EM | Admit: 2022-10-25 | Discharge: 2022-10-27 | Disposition: A | Discharge status: Home / Self Care | Payer: No Typology Code available for payment source | Attending: Family Medicine | Admitting: Family Medicine

## 2022-10-25 ENCOUNTER — Other Ambulatory Visit: Payer: Self-pay

## 2022-10-25 ENCOUNTER — Emergency Department (HOSPITAL_COMMUNITY): Payer: No Typology Code available for payment source

## 2022-10-25 DIAGNOSIS — E1169 Type 2 diabetes mellitus with other specified complication: Secondary | ICD-10-CM

## 2022-10-25 DIAGNOSIS — Z95810 Presence of automatic (implantable) cardiac defibrillator: Secondary | ICD-10-CM | POA: Diagnosis not present

## 2022-10-25 DIAGNOSIS — Z79899 Other long term (current) drug therapy: Secondary | ICD-10-CM | POA: Diagnosis not present

## 2022-10-25 DIAGNOSIS — I110 Hypertensive heart disease with heart failure: Secondary | ICD-10-CM | POA: Diagnosis not present

## 2022-10-25 DIAGNOSIS — I5022 Chronic systolic (congestive) heart failure: Secondary | ICD-10-CM | POA: Diagnosis not present

## 2022-10-25 DIAGNOSIS — F17210 Nicotine dependence, cigarettes, uncomplicated: Secondary | ICD-10-CM | POA: Diagnosis not present

## 2022-10-25 DIAGNOSIS — Z794 Long term (current) use of insulin: Secondary | ICD-10-CM | POA: Diagnosis not present

## 2022-10-25 DIAGNOSIS — R531 Weakness: Secondary | ICD-10-CM | POA: Diagnosis present

## 2022-10-25 DIAGNOSIS — E119 Type 2 diabetes mellitus without complications: Secondary | ICD-10-CM | POA: Diagnosis not present

## 2022-10-25 LAB — DIFFERENTIAL
Abs Immature Granulocytes: 0.01 10*3/uL (ref 0.00–0.07)
Basophils Absolute: 0 10*3/uL (ref 0.0–0.1)
Basophils Relative: 0 %
Eosinophils Absolute: 0.1 10*3/uL (ref 0.0–0.5)
Eosinophils Relative: 1 %
Immature Granulocytes: 0 %
Lymphocytes Relative: 19 %
Lymphs Abs: 1.5 10*3/uL (ref 0.7–4.0)
Monocytes Absolute: 0.5 10*3/uL (ref 0.1–1.0)
Monocytes Relative: 6 %
Neutro Abs: 6 10*3/uL (ref 1.7–7.7)
Neutrophils Relative %: 74 %

## 2022-10-25 LAB — RAPID URINE DRUG SCREEN, HOSP PERFORMED
Amphetamines: NOT DETECTED
Barbiturates: NOT DETECTED
Benzodiazepines: NOT DETECTED
Cocaine: POSITIVE — AB
Opiates: NOT DETECTED
Tetrahydrocannabinol: NOT DETECTED

## 2022-10-25 LAB — CBC
HCT: 39.2 % (ref 39.0–52.0)
Hemoglobin: 12.5 g/dL — ABNORMAL LOW (ref 13.0–17.0)
MCH: 29.4 pg (ref 26.0–34.0)
MCHC: 31.9 g/dL (ref 30.0–36.0)
MCV: 92.2 fL (ref 80.0–100.0)
Platelets: 192 10*3/uL (ref 150–400)
RBC: 4.25 MIL/uL (ref 4.22–5.81)
RDW: 13.3 % (ref 11.5–15.5)
WBC: 8.2 10*3/uL (ref 4.0–10.5)
nRBC: 0 % (ref 0.0–0.2)

## 2022-10-25 LAB — PROTIME-INR
INR: 1 (ref 0.8–1.2)
Prothrombin Time: 13.2 seconds (ref 11.4–15.2)

## 2022-10-25 LAB — COMPREHENSIVE METABOLIC PANEL
ALT: 15 U/L (ref 0–44)
AST: 14 U/L — ABNORMAL LOW (ref 15–41)
Albumin: 3.2 g/dL — ABNORMAL LOW (ref 3.5–5.0)
Alkaline Phosphatase: 40 U/L (ref 38–126)
Anion gap: 9 (ref 5–15)
BUN: 8 mg/dL (ref 6–20)
CO2: 23 mmol/L (ref 22–32)
Calcium: 8.5 mg/dL — ABNORMAL LOW (ref 8.9–10.3)
Chloride: 102 mmol/L (ref 98–111)
Creatinine, Ser: 0.9 mg/dL (ref 0.61–1.24)
GFR, Estimated: >60 mL/min (ref >60)
Glucose, Bld: 202 mg/dL — ABNORMAL HIGH (ref 70–99)
Potassium: 4.1 mmol/L (ref 3.5–5.1)
Sodium: 134 mmol/L — ABNORMAL LOW (ref 135–145)
Total Bilirubin: 0.7 mg/dL (ref 0.3–1.2)
Total Protein: 5.9 g/dL — ABNORMAL LOW (ref 6.5–8.1)

## 2022-10-25 LAB — URINALYSIS, ROUTINE W REFLEX MICROSCOPIC
Bilirubin Urine: NEGATIVE
Glucose, UA: NEGATIVE mg/dL
Hgb urine dipstick: NEGATIVE
Ketones, ur: NEGATIVE mg/dL
Leukocytes,Ua: NEGATIVE
Nitrite: NEGATIVE
Protein, ur: NEGATIVE mg/dL
Specific Gravity, Urine: 1.015 (ref 1.005–1.030)
pH: 7 (ref 5.0–8.0)

## 2022-10-25 LAB — I-STAT CHEM 8, ED
BUN: 7 mg/dL (ref 6–20)
Calcium, Ion: 1.09 mmol/L — ABNORMAL LOW (ref 1.15–1.40)
Chloride: 103 mmol/L (ref 98–111)
Creatinine, Ser: 0.8 mg/dL (ref 0.61–1.24)
Glucose, Bld: 195 mg/dL — ABNORMAL HIGH (ref 70–99)
HCT: 38 % — ABNORMAL LOW (ref 39.0–52.0)
Hemoglobin: 12.9 g/dL — ABNORMAL LOW (ref 13.0–17.0)
Potassium: 4.2 mmol/L (ref 3.5–5.1)
Sodium: 137 mmol/L (ref 135–145)
TCO2: 27 mmol/L (ref 22–32)

## 2022-10-25 LAB — APTT: aPTT: 25 seconds (ref 24–36)

## 2022-10-25 LAB — ETHANOL: Alcohol, Ethyl (B): <10 mg/dL (ref <10)

## 2022-10-25 LAB — CBG MONITORING, ED: Glucose-Capillary: 179 mg/dL — ABNORMAL HIGH (ref 70–99)

## 2022-10-25 MED ORDER — DIPHENHYDRAMINE HCL 50 MG/ML IJ SOLN
50.0000 mg | Freq: Once | INTRAMUSCULAR | Status: AC
  Administered 2022-10-25: 50 mg via INTRAVENOUS

## 2022-10-25 MED ORDER — ENOXAPARIN SODIUM 80 MG/0.8ML IJ SOSY
70.0000 mg | PREFILLED_SYRINGE | INTRAMUSCULAR | Status: DC
  Administered 2022-10-26: 70 mg via SUBCUTANEOUS
  Filled 2022-10-25: qty 0.7
  Filled 2022-10-25: qty 0.8

## 2022-10-25 MED ORDER — SACUBITRIL-VALSARTAN 97-103 MG PO TABS
1.0000 | ORAL_TABLET | Freq: Two times a day (BID) | ORAL | Status: DC
  Administered 2022-10-25 – 2022-10-26 (×3): 1 via ORAL
  Filled 2022-10-25 (×5): qty 1

## 2022-10-25 MED ORDER — METHYLPREDNISOLONE SODIUM SUCC 1000 MG IJ SOLR: 250.0000 mg | Freq: Once | INTRAMUSCULAR | Status: DC

## 2022-10-25 MED ORDER — DIPHENHYDRAMINE HCL 50 MG/ML IJ SOLN
INTRAMUSCULAR | Status: AC
  Filled 2022-10-25: qty 1

## 2022-10-25 MED ORDER — PRAZOSIN HCL 2 MG PO CAPS
2.0000 mg | ORAL_CAPSULE | Freq: Every day | ORAL | Status: DC
  Administered 2022-10-25 – 2022-10-26 (×2): 2 mg via ORAL
  Filled 2022-10-25 (×3): qty 1

## 2022-10-25 MED ORDER — SODIUM CHLORIDE 0.9% FLUSH
3.0000 mL | Freq: Once | INTRAVENOUS | Status: AC
  Administered 2022-10-25: 3 mL via INTRAVENOUS

## 2022-10-25 MED ORDER — PROCHLORPERAZINE MALEATE 5 MG PO TABS
10.0000 mg | ORAL_TABLET | Freq: Once | ORAL | Status: DC
  Filled 2022-10-25: qty 2

## 2022-10-25 MED ORDER — SODIUM CHLORIDE 0.9 % IV SOLN
250.0000 mg | Freq: Once | INTRAVENOUS | Status: DC
  Filled 2022-10-25: qty 4

## 2022-10-25 MED ORDER — IOHEXOL 350 MG/ML SOLN
75.0000 mL | Freq: Once | INTRAVENOUS | Status: AC | PRN
  Administered 2022-10-25: 75 mL via INTRAVENOUS

## 2022-10-25 MED ORDER — GABAPENTIN 300 MG PO CAPS
300.0000 mg | ORAL_CAPSULE | Freq: Three times a day (TID) | ORAL | Status: DC
  Administered 2022-10-25 – 2022-10-27 (×5): 300 mg via ORAL
  Filled 2022-10-25 (×5): qty 1

## 2022-10-25 MED ORDER — METHYLPREDNISOLONE SODIUM SUCC 125 MG IJ SOLR
INTRAMUSCULAR | Status: AC
  Filled 2022-10-25: qty 4

## 2022-10-25 MED ORDER — METHYLPREDNISOLONE SODIUM SUCC 125 MG IJ SOLR
125.0000 mg | Freq: Once | INTRAMUSCULAR | Status: AC
  Administered 2022-10-25: 125 mg via INTRAVENOUS

## 2022-10-25 MED ORDER — FAMOTIDINE IN NACL 20-0.9 MG/50ML-% IV SOLN
20.0000 mg | Freq: Once | INTRAVENOUS | Status: AC
  Administered 2022-10-25: 20 mg via INTRAVENOUS
  Filled 2022-10-25: qty 50

## 2022-10-25 MED ORDER — HYDROXYZINE HCL 25 MG PO TABS: 25.0000 mg | ORAL_TABLET | Freq: Three times a day (TID) | ORAL | Status: DC | PRN

## 2022-10-25 MED ORDER — METHYLPREDNISOLONE SODIUM SUCC 40 MG IJ SOLR: 40.0000 mg | Freq: Once | INTRAMUSCULAR | Status: DC

## 2022-10-25 MED ORDER — OXYCODONE-ACETAMINOPHEN 5-325 MG PO TABS
1.0000 | ORAL_TABLET | Freq: Once | ORAL | Status: AC
  Administered 2022-10-25: 1 via ORAL
  Filled 2022-10-25: qty 1

## 2022-10-25 MED ORDER — TRAZODONE HCL 100 MG PO TABS
300.0000 mg | ORAL_TABLET | Freq: Every evening | ORAL | Status: DC | PRN
  Administered 2022-10-26: 300 mg via ORAL
  Filled 2022-10-25: qty 3

## 2022-10-25 MED ORDER — DIPHENHYDRAMINE HCL 50 MG/ML IJ SOLN
25.0000 mg | Freq: Once | INTRAMUSCULAR | Status: AC
  Administered 2022-10-25: 25 mg via INTRAVENOUS
  Filled 2022-10-25: qty 1

## 2022-10-25 MED ORDER — DAPAGLIFLOZIN PROPANEDIOL 10 MG PO TABS
10.0000 mg | ORAL_TABLET | Freq: Every day | ORAL | Status: DC
  Administered 2022-10-26 – 2022-10-27 (×2): 10 mg via ORAL
  Filled 2022-10-25 (×2): qty 1

## 2022-10-25 MED ORDER — ARIPIPRAZOLE 5 MG PO TABS
5.0000 mg | ORAL_TABLET | Freq: Every day | ORAL | Status: DC
  Administered 2022-10-25 – 2022-10-27 (×3): 5 mg via ORAL
  Filled 2022-10-25 (×3): qty 1

## 2022-10-25 MED ORDER — INSULIN ASPART 100 UNIT/ML IJ SOLN
0.0000 [IU] | Freq: Three times a day (TID) | INTRAMUSCULAR | Status: DC
  Administered 2022-10-26: 5 [IU] via SUBCUTANEOUS
  Administered 2022-10-26: 15 [IU] via SUBCUTANEOUS
  Administered 2022-10-27 (×2): 3 [IU] via SUBCUTANEOUS

## 2022-10-25 MED ORDER — ENOXAPARIN SODIUM 40 MG/0.4ML IJ SOSY
40.0000 mg | PREFILLED_SYRINGE | INTRAMUSCULAR | Status: DC
Start: 2022-10-25 — End: 2022-10-25

## 2022-10-25 MED ORDER — ROSUVASTATIN CALCIUM 20 MG PO TABS
20.0000 mg | ORAL_TABLET | Freq: Every day | ORAL | Status: DC
  Administered 2022-10-25: 20 mg via ORAL
  Filled 2022-10-25: qty 1

## 2022-10-25 MED ORDER — SPIRONOLACTONE 25 MG PO TABS
25.0000 mg | ORAL_TABLET | Freq: Every day | ORAL | Status: DC
  Administered 2022-10-26: 25 mg via ORAL
  Filled 2022-10-25: qty 2

## 2022-10-25 MED ORDER — DIPHENHYDRAMINE HCL 25 MG PO CAPS: 50.0000 mg | ORAL_CAPSULE | Freq: Once | ORAL | Status: AC

## 2022-10-25 MED ORDER — TORSEMIDE 20 MG PO TABS
60.0000 mg | ORAL_TABLET | Freq: Every day | ORAL | Status: DC
  Administered 2022-10-26: 60 mg via ORAL
  Filled 2022-10-25: qty 3

## 2022-10-25 MED ORDER — DULOXETINE HCL 60 MG PO CPEP
90.0000 mg | ORAL_CAPSULE | Freq: Every day | ORAL | Status: DC
  Administered 2022-10-25 – 2022-10-27 (×3): 90 mg via ORAL
  Filled 2022-10-25: qty 3
  Filled 2022-10-25: qty 1
  Filled 2022-10-25: qty 3

## 2022-10-25 MED ORDER — CARVEDILOL 3.125 MG PO TABS
3.1250 mg | ORAL_TABLET | Freq: Two times a day (BID) | ORAL | Status: DC
  Administered 2022-10-26 (×2): 3.125 mg via ORAL
  Filled 2022-10-25 (×2): qty 1

## 2022-10-25 MED ORDER — DIPHENHYDRAMINE HCL 50 MG/ML IJ SOLN: 25.0000 mg | Freq: Once | INTRAMUSCULAR | Status: DC

## 2022-10-25 MED ORDER — DIGOXIN 125 MCG PO TABS
0.1250 mg | ORAL_TABLET | Freq: Every day | ORAL | Status: DC
  Administered 2022-10-25 – 2022-10-27 (×3): 0.125 mg via ORAL
  Filled 2022-10-25 (×3): qty 1

## 2022-10-25 NOTE — Code Documentation (Addendum)
Stroke Response Nurse Documentation Code Documentation  Luis [name] is a 52 y.o. male arriving to [facility]  via [facility] on 10/25/2022 with past medical hx of ICD, CHF, GERD, HTN, obesity, OSA, PTSD. On Eliquis (apixaban) daily. Code stroke was activated by EMS.   Patient from home where he was LKW at 0330 and now complaining of right sided weakness. Pt got up at 0330 and was in his normal state of health. When he woke up later in the morning, he noted himself to have right sided weakness and called EMS.    Stroke team at the bedside on patient arrival. Labs drawn and patient cleared for CT by Dr. Hyacinth Meeker. Patient to CT with team.   NIHSS 10, see documentation for details and code stroke times. Patient with disoriented, right arm weakness, right leg weakness, right decreased sensation, and Expressive aphasia  on exam.   The following imaging was completed:  CT Head.   Patient is not a candidate for IV Thrombolytic due to LKW 0330. Patient is not a candidate for IR.   Care Plan: q2h NIHSS and VS. Pt refusing IV access at this time. Additional imaging delayed. Symptoms waxing and waning. Pt also with allergy to contrast dye. Pre-medications ordered when pt is agreeable to IV access.   Bedside handoff with ED RN Chloe.    Resa Rinks L Naarah Borgerding  Rapid Response RN
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
## 2022-10-25 NOTE — Assessment & Plan Note (Addendum)
Neurology following, workup thus far unremarkable.  Repeat CT negative.  Patient has scheduled MRI (ICD is MRI compatible) in afternoon, can be discharged if negative.  He is also reporting a headache, received 1X doses of oxycodone, added Tylenol as needed. - Neurology following, appreciate recs - [facility] swallow eval -Follow-up MRI brain

## 2022-10-25 NOTE — ED Provider Notes (Signed)
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 appears reasonably stabilized for admission considering the current resources, flow, and capabilities available in the ED at this time, and I doubt any other [facility] requiring further screening and/or treatment in the ED prior to admission.    Medical Decision Making Amount and/or Complexity of Data Reviewed External Data Reviewed: labs, radiology, ECG and notes. Labs: ordered. Decision-making details documented in ED Course. Radiology: ordered and independent interpretation performed. Decision-making details documented in ED Course. ECG/medicine tests: ordered and independent interpretation performed. Decision-making details documented in ED Course.  Risk OTC drugs. Prescription drug management. Parenteral controlled substances. Decision regarding hospitalization.          Kalii Chesmore A, PA-C 10/25/22 2350    Gwyneth Sprout, MD 10/27/22 [phone]

## 2022-10-25 NOTE — Assessment & Plan Note (Signed)
A1C 9.2% (10/05/2022) Home meds include: Semglee 20u QHS, Novolog -mSSI -monitor CBGs

## 2022-10-25 NOTE — ED Notes (Signed)
Acute facial droop per EMS-Last known well   11am
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
## 2022-10-25 NOTE — Progress Notes (Signed)
FMTS Brief Progress Note  S: Saw patient's during night rounds who was recently admitted for concerns for CVA given plaint of right-sided weakness.  On arrival patient was laying in bed comfortably.  Patient stated that he came in today because his" legs gave out" and he fell and hit his head on the dresser.  He reports having bilateral lower extremity weakness worse on the right and also the right upper extremity weakness.  This is the first time he has had this episode but endorses intermittent lightheadedness that have caused him to fall in the past. Patient endorses sensation in the lower extremities but reports felling of "pins-and-needles" on his lower extremities.   O: BP 131/67   Pulse 71   Temp 98 F (36.7 C) (Oral)   Resp 18   Wt (!) 139.8 kg   SpO2 95%   BMI 50.51 kg/m   General: Alert, morbidly obese, NAD HEENT: PERRL, atraumatic, MMM CV: RRR, no murmurs, normal S1/S2 Pulm: CTAB, good WOB on RA, no crackles or wheezing Abd: Soft, obesely distended, no tenderness Ext: No BLE edema, +2 pedal pulse bilaterally Neuro: Alert and Oriented, pupils equal and reactive to light, 2/5 muscle strength on RUE and RLE, 5/5 muscle strength on LUE and 4/5    A/P: Initial head CT was negative and unable to get an MRI given ICD implant.  Neurology following and  recommend repeat head CT in 24 hours. -We will continue close monitoring -Repeat head CT in 24 hours -Follow-up with swallow eval. - Orders reviewed. Labs for AM ordered, which was adjusted as needed.    Jerre Simon, MD 10/25/2022, 8:16 PM PGY-2, [facility] Night Resident  Please page [phone] with questions.

## 2022-10-26 ENCOUNTER — Observation Stay (HOSPITAL_COMMUNITY): Payer: No Typology Code available for payment source

## 2022-10-26 ENCOUNTER — Other Ambulatory Visit (HOSPITAL_COMMUNITY): Payer: Self-pay

## 2022-10-26 DIAGNOSIS — F449 Dissociative and conversion disorder, unspecified: Secondary | ICD-10-CM

## 2022-10-26 DIAGNOSIS — R531 Weakness: Secondary | ICD-10-CM | POA: Diagnosis not present

## 2022-10-26 LAB — GLUCOSE, CAPILLARY
Glucose-Capillary: 222 mg/dL — ABNORMAL HIGH (ref 70–99)
Glucose-Capillary: 260 mg/dL — ABNORMAL HIGH (ref 70–99)

## 2022-10-26 LAB — BASIC METABOLIC PANEL
Anion gap: 10 (ref 5–15)
BUN: 20 mg/dL (ref 6–20)
CO2: 25 mmol/L (ref 22–32)
Calcium: 9 mg/dL (ref 8.9–10.3)
Chloride: 100 mmol/L (ref 98–111)
Creatinine, Ser: 1.15 mg/dL (ref 0.61–1.24)
GFR, Estimated: >60 mL/min (ref >60)
Glucose, Bld: 206 mg/dL — ABNORMAL HIGH (ref 70–99)
Potassium: 3.8 mmol/L (ref 3.5–5.1)
Sodium: 135 mmol/L (ref 135–145)

## 2022-10-26 LAB — CBG MONITORING, ED
Glucose-Capillary: 361 mg/dL — ABNORMAL HIGH (ref 70–99)
Glucose-Capillary: 253 mg/dL — ABNORMAL HIGH (ref 70–99)

## 2022-10-26 MED ORDER — OXYCODONE HCL 5 MG PO TABS
5.0000 mg | ORAL_TABLET | Freq: Once | ORAL | Status: AC
  Administered 2022-10-26: 5 mg via ORAL
  Filled 2022-10-26: qty 1

## 2022-10-26 MED ORDER — ACETAMINOPHEN 325 MG PO TABS
650.0000 mg | ORAL_TABLET | Freq: Four times a day (QID) | ORAL | Status: DC | PRN
  Administered 2022-10-26 – 2022-10-27 (×2): 650 mg via ORAL
  Filled 2022-10-26 (×2): qty 2

## 2022-10-26 MED ORDER — ACETAMINOPHEN 325 MG PO TABS: 650.0000 mg | ORAL_TABLET | Freq: Four times a day (QID) | ORAL | Status: DC | PRN

## 2022-10-26 MED ORDER — ROSUVASTATIN CALCIUM 40 MG PO TABS
40.0000 mg | ORAL_TABLET | Freq: Every day | ORAL | 0 refills | Status: DC
  Filled 2022-10-26: qty 30, 30d supply, fill #0

## 2022-10-26 MED ORDER — ROSUVASTATIN CALCIUM 20 MG PO TABS
40.0000 mg | ORAL_TABLET | Freq: Every day | ORAL | Status: DC
  Administered 2022-10-26 – 2022-10-27 (×2): 40 mg via ORAL
  Filled 2022-10-26 (×2): qty 2

## 2022-10-26 MED ORDER — INSULIN ASPART 100 UNIT/ML IJ SOLN
5.0000 [IU] | Freq: Once | INTRAMUSCULAR | Status: AC
  Administered 2022-10-26: 5 [IU] via SUBCUTANEOUS

## 2022-10-26 MED ORDER — CLOPIDOGREL BISULFATE 75 MG PO TABS
75.0000 mg | ORAL_TABLET | Freq: Every day | ORAL | Status: DC
  Administered 2022-10-26 – 2022-10-27 (×2): 75 mg via ORAL
  Filled 2022-10-26 (×2): qty 1

## 2022-10-26 NOTE — ED Notes (Signed)
Patient transported to CT 

## 2022-10-26 NOTE — Progress Notes (Addendum)
STROKE TEAM PROGRESS NOTE   INTERVAL HISTORY No family at the bedside.  He will need an MRI, per MRI tech he does have a compatible ICD.  He appears to have a right-sided weakness and right-sided sensory deficit with some, functional component.  Left leg is also intermittently weak.  MRI and MRA pending as well as his echo.  Vitals:   10/26/22 0400 10/26/22 0500 10/26/22 0600 10/26/22 0700  BP: (!) 120/53 137/67    Pulse: 61 65 62 72  Resp:  18    Temp:      TempSrc:      SpO2: 93% 91% 95% 91%  Weight:      Height:       CBC:  Recent Labs  Lab 10/25/22 1136 10/25/22 1140  WBC 8.2  --   NEUTROABS 6.0  --   HGB 12.5* 12.9*  HCT 39.2 38.0*  MCV 92.2  --   PLT 192  --    Basic Metabolic Panel:  Recent Labs  Lab 10/25/22 1136 10/25/22 1140  NA 134* 137  K 4.1 4.2  CL 102 103  CO2 23  --   GLUCOSE 202* 195*  BUN 8 7  CREATININE 0.90 0.80  CALCIUM 8.5*  --    Lipid Panel: No results for input(s): "CHOL", "TRIG", "HDL", "CHOLHDL", "VLDL", "LDLCALC" in the last 168 hours. HgbA1c: No results for input(s): "HGBA1C" in the last 168 hours. Urine Drug Screen:  Recent Labs  Lab 10/25/22 1300  LABOPIA NONE DETECTED  COCAINSCRNUR POSITIVE*  LABBENZ NONE DETECTED  AMPHETMU NONE DETECTED  THCU NONE DETECTED  LABBARB NONE DETECTED    Alcohol Level  Recent Labs  Lab 10/25/22 1136  ETH <10    IMAGING past 24 hours CT ANGIO HEAD NECK W WO CM (CODE STROKE)  Result Date: 10/25/2022 CLINICAL DATA:  Stroke suspected EXAM: CT ANGIOGRAPHY HEAD AND NECK WITH AND WITHOUT CONTRAST TECHNIQUE: Multidetector CT imaging of the head and neck was performed using the standard protocol during bolus administration of intravenous contrast. Multiplanar CT image reconstructions and MIPs were obtained to evaluate the vascular anatomy. Carotid stenosis measurements (when applicable) are obtained utilizing NASCET criteria, using the distal internal carotid diameter as the denominator. RADIATION  DOSE REDUCTION: This exam was performed according to the departmental dose-optimization program which includes automated exposure control, adjustment of the mA and/or kV according to patient size and/or use of iterative reconstruction technique. CONTRAST:  75mL OMNIPAQUE IOHEXOL 350 MG/ML SOLN COMPARISON:  10/25/2022 CT head, CTA head 01/19/2019 FINDINGS: CT HEAD FINDINGS For noncontrast findings, please see same day CT head. CTA NECK FINDINGS Evaluation is somewhat limited by bolus timing, body habitus, and beam hardening artifact from the patient's left chest cardiac device. Aortic arch: Standard branching. Imaged portion shows no evidence of aneurysm or dissection. No significant stenosis of the major arch vessel origins. Right carotid system: No evidence of stenosis, dissection, or occlusion. Left carotid system: No evidence of stenosis, dissection, or occlusion. Vertebral arteries: Poor visualization of the origin of the left vertebral artery. The remainder of the left vertebral artery is patent to the skull base without significant stenosis or dissection. The right vertebral artery is patent from its origin to the skull base, without stenosis or dissection. Skeleton: No acute osseous abnormality. Degenerative changes in the cervical spine. Other neck: No acute finding. Upper chest: No focal pulmonary opacity or pleural effusion. Review of the MIP images confirms the above findings CTA HEAD FINDINGS Evaluation is limited by bolus  timing. Anterior circulation: Both internal carotid arteries are patent to the termini, without significant stenosis. A1 segments patent. Normal anterior communicating artery. Anterior cerebral arteries are patent to their distal aspects without significant stenosis. No M1 stenosis or occlusion. Poor visualization of the anterior left MCA branches (series 8, image 299), which could indicate stenosis or be related to bolus timing. The posterior left MCA branches appear patent without  significant stenosis. The right MCA branches appear patent without significant stenosis. Posterior circulation: Vertebral arteries patent to the vertebrobasilar junction without significant stenosis. Posterior inferior cerebellar arteries patent proximally. Basilar patent to its distal aspect without significant stenosis. Superior cerebellar arteries patent proximally. Patent P1 segments. PCAs perfused to their distal aspects without significant stenosis. The bilateral posterior communicating arteries are not visualized. Venous sinuses: As permitted by contrast timing, patent. Anatomic variants: None significant. Review of the MIP images confirms the above findings IMPRESSION: 1. Evaluation is somewhat limited by bolus timing, body habitus, and beam hardening artifact from the patient's left chest cardiac device. 2. Poor visualization of the anterior left MCA branches, which could indicate stenosis or be related to bolus timing. 3. No other intracranial large vessel occlusion or significant stenosis. 4. No hemodynamically significant stenosis in the neck. Code stroke imaging results were communicated on 10/25/2022 at 3:57 pm to provider Dr. Derry Lory via telephone, who verbally acknowledged these results. Electronically Signed   By: Wiliam Ke M.D.   On: 10/25/2022 16:14   CT HEAD CODE STROKE WO CONTRAST  Result Date: 10/25/2022 CLINICAL DATA:  Code stroke.  Acute stroke suspected EXAM: CT HEAD WITHOUT CONTRAST TECHNIQUE: Contiguous axial images were obtained from the base of the skull through the vertex without intravenous contrast. RADIATION DOSE REDUCTION: This exam was performed according to the departmental dose-optimization program which includes automated exposure control, adjustment of the mA and/or kV according to patient size and/or use of iterative reconstruction technique. COMPARISON:  Twelve days ago FINDINGS: Brain: No evidence of acute infarction, hemorrhage, hydrocephalus, extra-axial  collection or mass lesion/mass effect. Vascular: No hyperdense vessel or unexpected calcification. Skull: Normal. Negative for fracture or focal lesion. Sinuses/Orbits: No acute finding. Other: These results were communicated to Dr. Derry Lory at 11:49 am on 10/25/2022 by text page via the [facility] messaging system. ASPECTS [facility] Stroke Program Early CT Score) - Ganglionic level infarction (caudate, lentiform nuclei, internal capsule, insula, M1-M3 cortex): 7 - Supraganglionic infarction (M4-M6 cortex): 3 Total score (0-10 with 10 being normal): 10 IMPRESSION: No acute finding.  ASPECTS is 10. Electronically Signed   By: Tiburcio Pea M.D.   On: 10/25/2022 11:50    PHYSICAL EXAM GENERAL: Awake, alert, in no acute distress LUNGS: Normal respiratory effort. Non-labored breathing on room air CV: Regular rate and rhythm on telemetry  NEURO:  Mental Status: Awake, alert, and oriented to self and situation.  Patient has varying degrees of participation throughout exam and does not answer orientation questions including his age or the current month.  Patient's speech is fluent without dysarthria or evidence of aphasia.  No neglect is noted Cranial Nerves:  II: PERRL. visual fields full.  III, IV, VI: EOMI, patient tracks examiner throughout assessment  V: Patient reports a "pins and needles" sensation to the right face  VII: Face is symmetric resting and smiling.  VIII: Hearing is intact to voice IX, X: Phonation normal.  XI: Head is grossly midline and looks in both directions without limitation  XII: Does not protrude tongue to command Motor: Left upper and lower extremities  elevate antigravity without vertical drift.  Right upper and lower extremity weakness is present, though it is felt that the patient's weakness has some functional overlay.  Right upper and lower extremities move without gravity but when not being formally assessed, he is able to elevate the right upper extremity at the elbow  without apparent weakness.  Strength appears to improve with distraction and worsen with formal assessment of the right hemibody.  Tone is normal. Bulk is normal.  Sensation: Tingling sensation reported to the right face, upper extremity, and lower extremity compared to the left.  Coordination: Does not participate Gait: Deferred  ASSESSMENT/PLAN Mr. CHIVAS NOTZ is a 52 y.o. male with history of anxiety, PTSD, cocaine use, chronic systolic CHF, essential hypertension, hyperlipidemia, tobacco use, NICM, OSA, morbid obesity, and VT s/p biventricular ICD who presented to the ED via EMS on 10/25/22 for evaluation of right-sided weakness.  Conversion disorder Code Stroke CT head No acute abnormality. ASPECTS 10.    MRI  No acute abnormality  MRA  No high grade stenosis 2D echo pending LDL 97 HgbA1c 9.2 UDS positive for cocaine VTE prophylaxis - lovenox No antithrombotic prior to admission, now on clopidogrel 75 mg daily for life Therapy recommendations:  HH Disposition:  pending  Hypertension Congestive Heart Failure  Home meds: Coreg, Entresto, Aldactone, torsemide, prazosin, digoxin, Farxiga Stable Long-term BP goal normotensive  Hyperlipidemia Home meds:  Crestor 20mg  LDL 97, goal < 70 Increase Crestor to 40 Continue statin at discharge  Diabetes type II Uncontrolled Home meds: NovoLog, Semglee, Farxiga HgbA1c 9.2, goal < 7.0 CBGs SSI Close PCP follow-up for better DM control  Chronic cocaine user UDS positive for cocaine Cessation education provided Patient is willing to quit  Other Stroke Risk Factors Former cigarette smoker Obesity, Body mass index is 50.58 kg/m., BMI >/= 30 associated with increased stroke risk, recommend weight loss, diet and exercise as appropriate  Coronary artery disease Congestive heart failure Obstructive sleep apnea  Other Active Problems VT status post AICD Major depressive disorder, recurrent severe, anxiety disorder, PTSD-  resume medications per primary team Abilify, trazodone, Atarax, Cymbalta  Hospital day # 0  Patient seen and examined by NP/APP with MD. MD to update note as needed.   Elmer Picker, DNP, FNP-BC [facility] Pager: [phone]  ATTENDING NOTE: I reviewed above note and agree with the assessment and plan. Pt was seen and examined.   No family at bedside.  Patient lying in bed, awake alert, orientation x 3.  No aphasia, no gaze palsy, visual field full although with decreased visual acuity in all quadrants.  No facial droop, tongue midline.  Left upper and lower extremity 5/5, right upper and lower extremity giveaway weakness and distractable at least 4/5, consistent with functional component.  Sensation decreased on the right subjectively.  Finger-to-nose no ataxia although slow on the right.  CT no acute abnormality.  MRI no acute infarct, MRA head no LVO.  CTA neck unremarkable.  2D echo pending, however EF in 02/2022 was 25%.  UDS positive for cocaine, LDL 97, A1c 10.2.  Creatinine 0.80.  Patient current presentation consistent with conversion disorder.  However, patient does have significant risk factor for stroke, added Plavix this time.  Recommend to continue Plavix on discharge.  Increase Crestor from 20-40.  Aggressive risk factor modification.  Cocaine cessation education provided.  PT/OT recommend home health.  Continue follow-up with cardiology.  For detailed assessment and plan, please refer to above/below as I have made changes  wherever appropriate.   Neurology will sign off. Please call with questions. Pt will follow up with Dr. Frances Furbish at [facility] in about 4 weeks. Thanks for the consult.   Marvel Plan, MD PhD Stroke Neurology 10/26/2022 7:11 PM     To contact Stroke Continuity provider, please refer to [email or web address]. After hours, contact General Neurology

## 2022-10-26 NOTE — ED Notes (Signed)
ED TO INPATIENT HANDOFF REPORT  ED Nurse Name and Phone #: Nicholos Johns, RN [phone]  S Name/Age/Gender Luis [name] 52 y.o. male Room/Bed: 003C/003C  Code Status   Code Status: Full Code  Home/SNF/Other Home Patient oriented to: self, place, time, and situation Is this baseline? Yes   Triage Complete: Triage complete  Chief Complaint Weakness [R53.1]  Triage Note No notes on file   Allergies Allergies  Allergen Reactions   Aspirin Anaphylaxis and Other (See Comments)    Throat closing    Iodine-131 Hives   Peanut (Diagnostic) Anaphylaxis    Throat closes   Iodinated Contrast Media Hives    Can be premedicated    Latex Hives and Rash   Penicillins Nausea And Vomiting   Ultram [Tramadol] Hives and Nausea Only   Zestril [Lisinopril] Cough   Cinnamon    Augmentin [Amoxicillin-Pot Clavulanate] Diarrhea   Lidocaine Rash    Rash from adhesive on lidocaine patches     Level of Care/Admitting Diagnosis ED Disposition     ED Disposition  Admit   Condition  --   Comment  Hospital Area: [facility] [[id number]]  Level of Care: Med-Surg [16]  May place patient in observation at [facility] or [facility] if equivalent level of care is available:: No  Covid Evaluation: Confirmed COVID Negative  Diagnosis: Weakness [241835]  Admitting Physician: Lorri Frederick [[id number]]  Attending Physician: Nestor Ramp [[id number]]  Bed request comments: 3W          B Medical/Surgery History Past Medical History:  Diagnosis Date   Anxiety    Biventricular ICD (implantable cardioverter-defibrillator) in place    Chronic systolic CHF (congestive heart failure) (HCC)    Cocaine use    Diabetes mellitus without complication (HCC)    GERD (gastroesophageal reflux disease)    HLD (hyperlipidemia)    Hypertension    Morbid obesity (HCC)    NICM (nonischemic cardiomyopathy) (HCC)    OSA (obstructive sleep apnea)    PTSD (post-traumatic stress  disorder)    on Depakote   Refusal of blood transfusions as patient is Jehovah's Witness    Tobacco abuse    Past Surgical History:  Procedure Laterality Date   BIV ICD INSERTION CRT-D     FOOT FRACTURE SURGERY     ICD GENERATOR CHANGEOUT N/A 03/23/2022   Procedure: ICD GENERATOR CHANGEOUT;  Surgeon: Nelly Laurence, Roberts Gaudy, MD;  Location: [facility] INVASIVE CV LAB;  Service: Cardiovascular;  Laterality: N/A;   RIGHT HEART CATH N/A 03/06/2022   Procedure: RIGHT HEART CATH;  Surgeon: Orbie Pyo, MD;  Location: [facility] INVASIVE CV LAB;  Service: Cardiovascular;  Laterality: N/A;   RIGHT/LEFT HEART CATH AND CORONARY ANGIOGRAPHY N/A 04/16/2022   Procedure: RIGHT/LEFT HEART CATH AND CORONARY ANGIOGRAPHY;  Surgeon: Laurey Morale, MD;  Location: [facility] INVASIVE CV LAB;  Service: Cardiovascular;  Laterality: N/A;   ROTATOR CUFF REPAIR     TONSILLECTOMY       A IV Location/Drains/Wounds Patient Lines/Drains/Airways Status     Active Line/Drains/Airways     Name Placement date Placement time Site Days   Peripheral IV 10/25/22 20 G Left Antecubital 10/25/22  --  Antecubital  1   Peripheral IV 10/25/22 22 G 1.75" Anterior;Left Forearm 10/25/22  1724  Forearm  1            Intake/Output Last 24 hours  Intake/Output Summary (Last 24 hours) at 10/26/2022 1459 Last data filed at 10/26/2022 1215 Gross per 24 hour  Intake --  Output 1950 ml  Net -1950 ml    Labs/Imaging Results for orders placed or performed during the hospital encounter of 10/25/22 (from the past 48 hour(s))  CBG monitoring, ED     Status: Abnormal   Collection Time: 10/25/22 11:33 AM  Result Value Ref Range   Glucose-Capillary 179 (H) 70 - 99 mg/dL    Comment: Glucose reference range applies only to samples taken after fasting for at least 8 hours.  Protime-INR     Status: None   Collection Time: 10/25/22 11:36 AM  Result Value Ref Range   Prothrombin Time 13.2 11.4 - 15.2 seconds   INR 1.0 0.8 - 1.2    Comment: (NOTE) INR  goal varies based on device and disease states. Performed at [facility], [address], [place], [place] [address]   APTT     Status: None   Collection Time: 10/25/22 11:36 AM  Result Value Ref Range   aPTT 25 24 - 36 seconds    Comment: Performed at [facility], [address], [place], [place] [address]  CBC     Status: Abnormal   Collection Time: 10/25/22 11:36 AM  Result Value Ref Range   WBC 8.2 4.0 - 10.5 K/uL   RBC 4.25 4.22 - 5.81 MIL/uL   Hemoglobin 12.5 (L) 13.0 - 17.0 g/dL   HCT 09.8 11.9 - 14.7 %   MCV 92.2 80.0 - 100.0 fL   MCH 29.4 26.0 - 34.0 pg   MCHC 31.9 30.0 - 36.0 g/dL   RDW 82.9 56.2 - 13.0 %   Platelets 192 150 - 400 K/uL   nRBC 0.0 0.0 - 0.2 %    Comment: Performed at [facility], [address], [place], [place] [address]  Differential     Status: None   Collection Time: 10/25/22 11:36 AM  Result Value Ref Range   Neutrophils Relative % 74 %   Neutro Abs 6.0 1.7 - 7.7 K/uL   Lymphocytes Relative 19 %   Lymphs Abs 1.5 0.7 - 4.0 K/uL   Monocytes Relative 6 %   Monocytes Absolute 0.5 0.1 - 1.0 K/uL   Eosinophils Relative 1 %   Eosinophils Absolute 0.1 0.0 - 0.5 K/uL   Basophils Relative 0 %   Basophils Absolute 0.0 0.0 - 0.1 K/uL   Immature Granulocytes 0 %   Abs Immature Granulocytes 0.01 0.00 - 0.07 K/uL    Comment: Performed at [facility], [address], [place], [place] [address]  Comprehensive metabolic panel     Status: Abnormal   Collection Time: 10/25/22 11:36 AM  Result Value Ref Range   Sodium 134 (L) 135 - 145 mmol/L   Potassium 4.1 3.5 - 5.1 mmol/L   Chloride 102 98 - 111 mmol/L   CO2 23 22 - 32 mmol/L   Glucose, Bld 202 (H) 70 - 99 mg/dL    Comment: Glucose reference range applies only to samples taken after fasting for at least 8 hours.   BUN 8 6 - 20 mg/dL   Creatinine, Ser 9.52 0.61 - 1.24 mg/dL   Calcium 8.5 (L) 8.9 - 10.3 mg/dL   Total Protein 5.9 (L) 6.5 - 8.1 g/dL   Albumin 3.2 (L) 3.5 - 5.0  g/dL   AST 14 (L) 15 - 41 U/L   ALT 15 0 - 44 U/L   Alkaline Phosphatase 40 38 - 126 U/L   Total Bilirubin 0.7 0.3 -  1.2 mg/dL   GFR, Estimated >16 >10 mL/min    Comment: (NOTE) Calculated using the CKD-EPI Creatinine Equation (2021)    Anion gap 9 5 - 15    Comment: Performed at [facility], [address], [place], [place] [address]  Ethanol     Status: None   Collection Time: 10/25/22 11:36 AM  Result Value Ref Range   Alcohol, Ethyl (B) <10 <10 mg/dL    Comment: (NOTE) Lowest detectable limit for serum alcohol is 10 mg/dL.  For medical purposes only. Performed at [facility], [address] [place], [place] [address]   I-stat chem 8, ED     Status: Abnormal   Collection Time: 10/25/22 11:40 AM  Result Value Ref Range   Sodium 137 135 - 145 mmol/L   Potassium 4.2 3.5 - 5.1 mmol/L   Chloride 103 98 - 111 mmol/L   BUN 7 6 - 20 mg/dL   Creatinine, Ser 1.91 0.61 - 1.24 mg/dL   Glucose, Bld 478 (H) 70 - 99 mg/dL    Comment: Glucose reference range applies only to samples taken after fasting for at least 8 hours.   Calcium, Ion 1.09 (L) 1.15 - 1.40 mmol/L   TCO2 27 22 - 32 mmol/L   Hemoglobin 12.9 (L) 13.0 - 17.0 g/dL   HCT 29.5 (L) 62.1 - 30.8 %  Urinalysis, Routine w reflex microscopic -Urine, Clean Catch     Status: None   Collection Time: 10/25/22  1:00 PM  Result Value Ref Range   Color, Urine YELLOW YELLOW   APPearance CLEAR CLEAR   Specific Gravity, Urine 1.015 1.005 - 1.030   pH 7.0 5.0 - 8.0   Glucose, UA NEGATIVE NEGATIVE mg/dL   Hgb urine dipstick NEGATIVE NEGATIVE   Bilirubin Urine NEGATIVE NEGATIVE   Ketones, ur NEGATIVE NEGATIVE mg/dL   Protein, ur NEGATIVE NEGATIVE mg/dL   Nitrite NEGATIVE NEGATIVE   Leukocytes,Ua NEGATIVE NEGATIVE    Comment: Performed at [facility], [address], [place], [place] [address]  Rapid urine drug screen (hospital performed)     Status: Abnormal   Collection Time: 10/25/22  1:00 PM  Result Value Ref  Range   Opiates NONE DETECTED NONE DETECTED   Cocaine POSITIVE (A) NONE DETECTED   Benzodiazepines NONE DETECTED NONE DETECTED   Amphetamines NONE DETECTED NONE DETECTED   Tetrahydrocannabinol NONE DETECTED NONE DETECTED   Barbiturates NONE DETECTED NONE DETECTED    Comment: (NOTE) DRUG SCREEN FOR MEDICAL PURPOSES ONLY.  IF CONFIRMATION IS NEEDED FOR ANY PURPOSE, NOTIFY LAB WITHIN 5 DAYS.  LOWEST DETECTABLE LIMITS FOR URINE DRUG SCREEN Drug Class                     Cutoff (ng/mL) Amphetamine and metabolites    1000 Barbiturate and metabolites    200 Benzodiazepine                 200 Opiates and metabolites        300 Cocaine and metabolites        300 THC                            50 Performed at [facility], [address] [place], [place] [address]   CBG monitoring, ED     Status: Abnormal   Collection Time: 10/26/22  8:54 AM  Result Value Ref Range  Glucose-Capillary 361 (H) 70 - 99 mg/dL    Comment: Glucose reference range applies only to samples taken after fasting for at least 8 hours.  CBG monitoring, ED     Status: Abnormal   Collection Time: 10/26/22 11:49 AM  Result Value Ref Range   Glucose-Capillary 253 (H) 70 - 99 mg/dL    Comment: Glucose reference range applies only to samples taken after fasting for at least 8 hours.   CT HEAD WO CONTRAST ( )  Result Date: 10/26/2022 CLINICAL DATA:  Stroke follow-up EXAM: CT HEAD WITHOUT CONTRAST TECHNIQUE: Contiguous axial images were obtained from the base of the skull through the vertex without intravenous contrast. RADIATION DOSE REDUCTION: This exam was performed according to the departmental dose-optimization program which includes automated exposure control, adjustment of the mA and/or kV according to patient size and/or use of iterative reconstruction technique. COMPARISON:  CT Head and CTA head/neck 10/25/22 FINDINGS: Brain: No evidence of acute infarction, hemorrhage, hydrocephalus, extra-axial collection  or mass lesion/mass effect. Vascular: No hyperdense vessel or unexpected calcification. Skull: Normal. Negative for fracture or focal lesion. Sinuses/Orbits: No middle ear or mastoid effusion. Paranasal sinuses are clear. Orbits are unremarkable. Other: Nonspecific soft tissue thickening in the suboccipital region, possibly posttraumatic. IMPRESSION: No acute intracranial abnormality. Electronically Signed   By: Lorenza Cambridge M.D.   On: 10/26/2022 09:32   CT ANGIO HEAD NECK W WO CM (CODE STROKE)  Result Date: 10/25/2022 CLINICAL DATA:  Stroke suspected EXAM: CT ANGIOGRAPHY HEAD AND NECK WITH AND WITHOUT CONTRAST TECHNIQUE: Multidetector CT imaging of the head and neck was performed using the standard protocol during bolus administration of intravenous contrast. Multiplanar CT image reconstructions and MIPs were obtained to evaluate the vascular anatomy. Carotid stenosis measurements (when applicable) are obtained utilizing NASCET criteria, using the distal internal carotid diameter as the denominator. RADIATION DOSE REDUCTION: This exam was performed according to the departmental dose-optimization program which includes automated exposure control, adjustment of the mA and/or kV according to patient size and/or use of iterative reconstruction technique. CONTRAST:  75mL OMNIPAQUE IOHEXOL 350 MG/ML SOLN COMPARISON:  10/25/2022 CT head, CTA head 01/19/2019 FINDINGS: CT HEAD FINDINGS For noncontrast findings, please see same day CT head. CTA NECK FINDINGS Evaluation is somewhat limited by bolus timing, body habitus, and beam hardening artifact from the patient's left chest cardiac device. Aortic arch: Standard branching. Imaged portion shows no evidence of aneurysm or dissection. No significant stenosis of the major arch vessel origins. Right carotid system: No evidence of stenosis, dissection, or occlusion. Left carotid system: No evidence of stenosis, dissection, or occlusion. Vertebral arteries: Poor visualization  of the origin of the left vertebral artery. The remainder of the left vertebral artery is patent to the skull base without significant stenosis or dissection. The right vertebral artery is patent from its origin to the skull base, without stenosis or dissection. Skeleton: No acute osseous abnormality. Degenerative changes in the cervical spine. Other neck: No acute finding. Upper chest: No focal pulmonary opacity or pleural effusion. Review of the MIP images confirms the above findings CTA HEAD FINDINGS Evaluation is limited by bolus timing. Anterior circulation: Both internal carotid arteries are patent to the termini, without significant stenosis. A1 segments patent. Normal anterior communicating artery. Anterior cerebral arteries are patent to their distal aspects without significant stenosis. No M1 stenosis or occlusion. Poor visualization of the anterior left MCA branches (series 8, image 299), which could indicate stenosis or be related to bolus timing. The posterior left MCA branches appear  patent without significant stenosis. The right MCA branches appear patent without significant stenosis. Posterior circulation: Vertebral arteries patent to the vertebrobasilar junction without significant stenosis. Posterior inferior cerebellar arteries patent proximally. Basilar patent to its distal aspect without significant stenosis. Superior cerebellar arteries patent proximally. Patent P1 segments. PCAs perfused to their distal aspects without significant stenosis. The bilateral posterior communicating arteries are not visualized. Venous sinuses: As permitted by contrast timing, patent. Anatomic variants: None significant. Review of the MIP images confirms the above findings IMPRESSION: 1. Evaluation is somewhat limited by bolus timing, body habitus, and beam hardening artifact from the patient's left chest cardiac device. 2. Poor visualization of the anterior left MCA branches, which could indicate stenosis or be  related to bolus timing. 3. No other intracranial large vessel occlusion or significant stenosis. 4. No hemodynamically significant stenosis in the neck. Code stroke imaging results were communicated on 10/25/2022 at 3:57 pm to provider Dr. Derry Lory via telephone, who verbally acknowledged these results. Electronically Signed   By: Wiliam Ke M.D.   On: 10/25/2022 16:14   CT HEAD CODE STROKE WO CONTRAST  Result Date: 10/25/2022 CLINICAL DATA:  Code stroke.  Acute stroke suspected EXAM: CT HEAD WITHOUT CONTRAST TECHNIQUE: Contiguous axial images were obtained from the base of the skull through the vertex without intravenous contrast. RADIATION DOSE REDUCTION: This exam was performed according to the departmental dose-optimization program which includes automated exposure control, adjustment of the mA and/or kV according to patient size and/or use of iterative reconstruction technique. COMPARISON:  Twelve days ago FINDINGS: Brain: No evidence of acute infarction, hemorrhage, hydrocephalus, extra-axial collection or mass lesion/mass effect. Vascular: No hyperdense vessel or unexpected calcification. Skull: Normal. Negative for fracture or focal lesion. Sinuses/Orbits: No acute finding. Other: These results were communicated to Dr. Derry Lory at 11:49 am on 10/25/2022 by text page via the [facility] messaging system. ASPECTS [facility] Stroke Program Early CT Score) - Ganglionic level infarction (caudate, lentiform nuclei, internal capsule, insula, M1-M3 cortex): 7 - Supraganglionic infarction (M4-M6 cortex): 3 Total score (0-10 with 10 being normal): 10 IMPRESSION: No acute finding.  ASPECTS is 10. Electronically Signed   By: Tiburcio Pea M.D.   On: 10/25/2022 11:50    Pending Labs Unresulted Labs (From admission, onward)     Start     Ordered   10/26/22 0500  Basic metabolic panel  Tomorrow morning,   R        10/25/22 2135   10/25/22 1801  HIV Antibody (routine testing w rflx)  (HIV Antibody (Routine  testing w reflex) panel)  Once,   R        10/25/22 1800            Vitals/Pain Today's Vitals   10/26/22 1149 10/26/22 1156 10/26/22 1213 10/26/22 1241  BP:  (!) 154/80    Pulse:  73    Resp:  19    Temp:    98.1 F (36.7 C)  TempSrc:      SpO2:  93%    Weight:      Height:      PainSc: 5   4      Isolation Precautions No active isolations  Medications Medications  carvedilol (COREG) tablet 3.125 mg (3.125 mg Oral Given 10/26/22 0909)  digoxin (LANOXIN) tablet 0.125 mg (0.125 mg Oral Given 10/26/22 0910)  prazosin (MINIPRESS) capsule 2 mg (2 mg Oral Given 10/25/22 2156)  sacubitril-valsartan (ENTRESTO) 97-103 mg per tablet (1 tablet Oral Given 10/26/22 0944)  spironolactone (ALDACTONE)  tablet 25 mg (25 mg Oral Given 10/26/22 0910)  torsemide (DEMADEX) tablet 60 mg (60 mg Oral Given 10/26/22 0909)  ARIPiprazole (ABILIFY) tablet 5 mg (5 mg Oral Given 10/26/22 0910)  DULoxetine (CYMBALTA) DR capsule 90 mg (90 mg Oral Given 10/26/22 0908)  hydrOXYzine (ATARAX) tablet 25 mg (has no administration in time range)  traZODone (DESYREL) tablet 300 mg (has no administration in time range)  dapagliflozin propanediol (FARXIGA) tablet 10 mg (10 mg Oral Given 10/26/22 0915)  gabapentin (NEURONTIN) capsule 300 mg (300 mg Oral Given 10/26/22 0911)  insulin aspart (novoLOG) injection 0-15 Units (15 Units Subcutaneous Given 10/26/22 0911)  enoxaparin (LOVENOX) injection 70 mg (has no administration in time range)  rosuvastatin (CRESTOR) tablet 40 mg (40 mg Oral Given 10/26/22 0910)  clopidogrel (PLAVIX) tablet 75 mg (75 mg Oral Given 10/26/22 0909)  acetaminophen (TYLENOL) tablet 650 mg (has no administration in time range)  sodium chloride flush (NS) 0.9 % injection 3 mL (3 mLs Intravenous Given 10/25/22 1344)  diphenhydrAMINE (BENADRYL) capsule 50 mg ( Oral See Alternative 10/25/22 1405)    Or  diphenhydrAMINE (BENADRYL) injection 50 mg (50 mg Intravenous Given 10/25/22 1405)  methylPREDNISolone  sodium succinate (SOLU-MEDROL) 125 mg/2 mL injection 125 mg (125 mg Intravenous Given 10/25/22 1508)  methylPREDNISolone sodium succinate (SOLU-MEDROL) 125 mg/2 mL injection 125 mg (125 mg Intravenous Given 10/25/22 1400)  methylPREDNISolone sodium succinate (SOLU-MEDROL) 125 mg/2 mL injection 125 mg (125 mg Intravenous Given 10/25/22 1401)  famotidine (PEPCID) IVPB 20 mg premix (0 mg Intravenous Stopped 10/25/22 1713)  oxyCODONE-acetaminophen (PERCOCET/ROXICET) 5-325 MG per tablet 1 tablet (1 tablet Oral Given 10/25/22 1534)  diphenhydrAMINE (BENADRYL) injection 25 mg (25 mg Intravenous Given 10/25/22 1508)  iohexol (OMNIPAQUE) 350 MG/ML injection 75 mL (75 mLs Intravenous Contrast Given 10/25/22 1539)  oxyCODONE (Oxy IR/ROXICODONE) immediate release tablet 5 mg (5 mg Oral Given 10/26/22 1059)    Mobility walks with person assist     Focused Assessments Neuro Assessment Handoff:  Swallow screen pass? Yes    NIH Stroke Scale  Dizziness Present: No Headache Present: Yes Interval: Shift assessment Level of Consciousness (1a.)   : Alert, keenly responsive LOC Questions (1b. )   : Answers both questions correctly LOC Commands (1c. )   : Performs both tasks correctly Best Gaze (2. )  : Normal Visual (3. )  : No visual loss Facial Palsy (4. )    : Normal symmetrical movements Motor Arm, Left (5a. )   : Drift Motor Arm, Right (5b. ) : No drift Motor Leg, Left (6a. )  : No drift Motor Leg, Right (6b. ) : Drift Limb Ataxia (7. ): Absent Sensory (8. )  : Normal, no sensory loss Best Language (9. )  : No aphasia Dysarthria (10. ): Normal Extinction/Inattention (11.)   : No Abnormality Complete NIHSS TOTAL: 2 Last date known well: 10/25/22 Last time known well: 0330 Neuro Assessment: Within Defined Limits Neuro Checks:   Initial (10/25/22 1200)  Has TPA been given? No If patient is a Neuro Trauma and patient is going to OR before floor call report to 4N Charge nurse: [phone] or  [phone]   R Recommendations: See Admitting Provider Note  Report given to:   Additional Notes: Pt currently at MRI w/ SWOT nurse, will send up once he's back

## 2022-10-26 NOTE — Inpatient Diabetes Management (Signed)
Inpatient Diabetes Program Recommendations  AACE/ADA: New Consensus Statement on Inpatient Glycemic Control (2015)  Target Ranges:  Prepandial:   less than 140 mg/dL      Peak postprandial:   less than 180 mg/dL (1-2 hours)      Critically ill patients:  140 - 180 mg/dL    Latest Reference Range & Units 10/26/22 08:54 10/26/22 11:49  Glucose-Capillary 70 - 99 mg/dL 409 (H)  15 units Novolog  253 (H)  (H): Data is abnormally high    Admit with: Weakness/ UDS + for Cocaine  History: DM  Home DM Meds: Farxiga 10 mg daily        70/30 Insulin 70 units BID        Semglee 20 units QHS  Current Orders: Farxiga 10 mg daily     Novolog Moderate Correction Scale/ SSI (0-15 units) TID AC       MD- Note pt taking 70/30 Insulin + Semglee insulin at home  CBGs elevated since arrival to the ED  May consider restarting a portion of pt's home dose 70/30 Insulin:  70/30 Insulin 35 units BID (50% home dose to start)     --Will follow patient during hospitalization--  Ambrose Finland RN, MSN, CDCES Diabetes Coordinator Inpatient Glycemic Control Team Team Pager: [phone] (8a-5p)

## 2022-10-26 NOTE — Hospital Course (Signed)
Luis [name] is a 52 y.o. male presenting with cocaine use disorder, OSA, T2DM, HTN HFrEF with BivICD, and MDD presenting to [facility] ED as code stroke for sudden onset LE weakness and right-sided deficits.   In the ED, he presented via EMS for acute facial droop, LKW 11 AM, code stroke activated. Evluated by neurology upon arrival, CT head negative for any intracranial process.  CBC, UA and chemistries unremarkable on admission.  Vitals stable.  UDS positive for cocaine, remainder of tox screen negative. Meds given in ED: solumedrol 125 mg injection, benadryl 2x, oxycodone 1x.   Repeat CT head the next morning was also negative, MRA head and MRI brain showed*** There is concern for behavior component causing symptoms as right-sided weakness is not consistent on every exam.

## 2022-10-26 NOTE — Progress Notes (Signed)
     Daily Progress Note Intern Pager: [phone]  Patient name: Luis [name] Medical record number: [id number] Date of birth: 1970/09/04 Age: 52 y.o. Gender: male  Primary Care Provider: Margot Ables, MD (Inactive) Consultants: Neurology Code Status: FULL  Pt Overview and Major Events to Date:  4/29- Admission  Assessment and Plan: Luis [name] is a 52 y.o. male presenting with cocaine use disorder, OSA, T2DM, HTN HFrEF with BivICD, and MDD presenting to [facility] ED as code stroke for sudden onset LE weakness and right-sided deficits.  * Weakness Neurology following, workup thus far unremarkable.  Repeat CT negative.  Patient has scheduled MRI (ICD is MRI compatible) in afternoon, can be discharged if negative.  He is also reporting a headache, received 1X doses of oxycodone, added Tylenol as needed. - Neurology following, appreciate recs - [facility] swallow eval -Follow-up MRI brain  T2DM (type 2 diabetes mellitus) (HCC) A1C 9.2% (10/05/2022) Home meds include: Semglee 20u QHS, Novolog -mSSI -monitor CBGs    FEN/GI: Regular PPx: Lovenox Dispo:Home pending clinical improvement .   Subjective:  Complains of headache today, adding 1 extra dose of oxycodone and as needed Tylenol.  Otherwise no acute concerns.  Objective: Temp:  [98 F (36.7 C)-98.2 F (36.8 C)] 98.1 F (36.7 C) (04/29 1241) Pulse Rate:  [58-102] 73 (04/29 1156) Resp:  [11-20] 19 (04/29 1156) BP: (120-163)/(52-80) 154/80 (04/29 1156) SpO2:  [91 %-100 %] 93 % (04/29 1156) Weight:  [140 kg] 140 kg (04/29 0351) Physical Exam: General: Morbidly obese, nontoxic-appearing, NAD Neck: Supple, no masses or thyromegaly Cardiovascular: RRR, no M/R/G Respiratory: Normal work of breathing on room air, lung sounds clear anteriorly Gastrointestinal: Distended, not out of proportion to body habitus, nontender, normal active bowel sounds Derm: Warm and dry  Laboratory: Most recent CBC Lab Results   Component Value Date   WBC 8.2 10/25/2022   HGB 12.9 (L) 10/25/2022   HCT 38.0 (L) 10/25/2022   MCV 92.2 10/25/2022   PLT 192 10/25/2022   Most recent BMP    Latest Ref Rng & Units 10/25/2022   11:40 AM  BMP  Glucose 70 - 99 mg/dL 147   BUN 6 - 20 mg/dL 7   Creatinine 8.29 - 5.62 mg/dL 1.30   Sodium 865 - 784 mmol/L 137   Potassium 3.5 - 5.1 mmol/L 4.2   Chloride 98 - 111 mmol/L 103      Imaging CT HEAD WO CONTRAST ( ) Result Date: 10/26/2022 IMPRESSION: No acute intracranial abnormality.    Lorri Frederick, MD 10/26/2022, 2:54 PM  PGY-1, [facility] FPTS Intern pager: [phone], text pages welcome Secure chat group [facility] Teaching Service

## 2022-10-26 NOTE — Evaluation (Signed)
Physical Therapy Evaluation Patient Details Name: Luis [name] MRN: [id number] DOB: 16-Oct-1970 Today's Date: 10/26/2022  History of Present Illness  Pt is a 52 y.o. M who presents 10/25/2022 for evaluation of right sided weakness. Significant PMH: anxiety, PTSD, cocaine use, chronic systolic CHF, HTN, tobacco use, NICM, OSA, morbid obesity, VT s/p biventricular ICD.  Clinical Impression  PTA, pt lives alone and is a household ambulator using a Occupational hygienist. Pt reports his significant other is available to assist as needed. Pt presents with R sided weakness, decreased sensation and gait abnormalities, however, inconsistent examination overall in comparison to functional presentation. Pt ambulating 25 ft with a walker at a min guard assist level; no R knee buckle appreciated. Will benefit from follow up HHPT to address deficits and maximize functional mobility.     Recommendations for follow up therapy are one component of a multi-disciplinary discharge planning process, led by the attending physician.  Recommendations may be updated based on patient status, additional functional criteria and insurance authorization.  Follow Up Recommendations       Assistance Recommended at Discharge PRN  Patient can return home with the following  A little help with bathing/dressing/bathroom;Assistance with cooking/housework;Assist for transportation;Help with stairs or ramp for entrance    Equipment Recommendations None recommended by PT  Recommendations for Other Services       Functional Status Assessment Patient has had a recent decline in their functional status and demonstrates the ability to make significant improvements in function in a reasonable and predictable amount of time.     Precautions / Restrictions Precautions Precautions: Fall Restrictions Weight Bearing Restrictions: No      Mobility  Bed Mobility Overal bed mobility: Modified Independent                   Transfers Overall transfer level: Needs assistance Equipment used: Rolling walker (2 wheels) Transfers: Sit to/from Stand, Bed to chair/wheelchair/BSC Sit to Stand: Min assist Stand pivot transfers: Min guard         General transfer comment: Light minA to power up    Ambulation/Gait Ambulation/Gait assistance: Min guard Gait Distance (Feet): 25 Feet Assistive device: Rolling walker (2 wheels) Gait Pattern/deviations: Step-through pattern, Decreased stride length, Decreased dorsiflexion - right, Decreased dorsiflexion - left, Decreased step length - right Gait velocity: decreased Gait velocity interpretation: <1.8 ft/sec, indicate of risk for recurrent falls   General Gait Details: Slow and steady pace, decreased bilateral heel strike at initial contact, no R knee buckle in standing  Stairs            Wheelchair Mobility    Modified Rankin (Stroke Patients Only) Modified Rankin (Stroke Patients Only) Pre-Morbid Rankin Score: Slight disability Modified Rankin: Moderately severe disability     Balance Overall balance assessment: Needs assistance Sitting-balance support: Feet supported Sitting balance-Leahy Scale: Good     Standing balance support: Bilateral upper extremity supported, During functional activity Standing balance-Leahy Scale: Poor                               Pertinent Vitals/Pain Pain Assessment Pain Assessment: Faces Faces Pain Scale: Hurts a little bit Pain Location: headache, low back Pain Descriptors / Indicators: Discomfort Pain Intervention(s): Monitored during session    Home Living Family/patient expects to be discharged to:: Private residence Living Arrangements: Alone Available Help at Discharge: Friend(s) (girlfriend) Type of Home: Apartment Home Access: Level entry       Home  Layout: One level Home Equipment: Adaptive equipment;Rolling Walker (2 wheels)      Prior Function Prior Level of Function : Needs  assist;History of Falls (last six months)             Mobility Comments: 2 falls within past 3 months, using Rollator ADLs Comments: difficulty performing ADLs like lower body dressing, cooking, cleaning, bathing (has AE at home / has aide at home)     Hand Dominance   Dominant Hand: Right    Extremity/Trunk Assessment   Upper Extremity Assessment Upper Extremity Assessment: Defer to OT evaluation    Lower Extremity Assessment Lower Extremity Assessment: RLE deficits/detail;LLE deficits/detail RLE Deficits / Details: Grossly 2-/5, however, functionally no knee buckle in standing RLE Sensation: decreased light touch LLE Deficits / Details: Grossly 5/5    Cervical / Trunk Assessment Cervical / Trunk Assessment: Other exceptions Cervical / Trunk Exceptions: increased body habitus  Communication   Communication: No difficulties  Cognition Arousal/Alertness: Awake/alert Behavior During Therapy: WFL for tasks assessed/performed Overall Cognitive Status: Within Functional Limits for tasks assessed                                 General Comments: Likely baseline, though inconsistent assessment during examination        General Comments      Exercises     Assessment/Plan    PT Assessment Patient needs continued PT services  PT Problem List Decreased strength;Decreased activity tolerance;Decreased balance;Decreased mobility;Impaired sensation       PT Treatment Interventions DME instruction;Gait training;Stair training;Functional mobility training;Therapeutic activities;Therapeutic exercise;Balance training;Patient/family education    PT Goals (Current goals can be found in the Care Plan section)  Acute Rehab PT Goals Patient Stated Goal: did not state PT Goal Formulation: With patient Time For Goal Achievement: 11/09/22 Potential to Achieve Goals: Good    Frequency Min 3X/week     Co-evaluation               AM-PAC PT "6 Clicks"  Mobility  Outcome Measure Help needed turning from your back to your side while in a flat bed without using bedrails?: None Help needed moving from lying on your back to sitting on the side of a flat bed without using bedrails?: None Help needed moving to and from a bed to a chair (including a wheelchair)?: A Little Help needed standing up from a chair using your arms (e.g., wheelchair or bedside chair)?: A Little Help needed to walk in hospital room?: A Little Help needed climbing 3-5 steps with a railing? : A Lot 6 Click Score: 19    End of Session   Activity Tolerance: Patient tolerated treatment well Patient left: in bed;with call bell/phone within reach;with bed alarm set Nurse Communication: Mobility status PT Visit Diagnosis: Unsteadiness on feet (R26.81);Other abnormalities of gait and mobility (R26.89);Difficulty in walking, not elsewhere classified (R26.2)    Time: 0347-4259 PT Time Calculation (min) (ACUTE ONLY): 21 min   Charges:   PT Evaluation $PT Eval Low Complexity: 1 Low          Lillia Pauls, PT, DPT Acute Rehabilitation Services Office [phone]   Norval Morton 10/26/2022, 5:07 PM

## 2022-10-26 NOTE — Discharge Summary (Incomplete)
Family Medicine Teaching [facility] Discharge Summary  Patient name: Luis [name] Medical record number: [id number] Date of birth: Aug 31, 1970 Age: 52 y.o. Gender: male Date of Admission: 10/25/2022  Date of Discharge: 10/27/22  Admitting Physician: Lorri Frederick, MD  Primary Care Provider: Margot Ables, MD (Inactive) Consultants: Neuorology  Indication for Hospitalization: Right sided weakness  Discharge Diagnoses/Problem List:  Principal Problem for Admission: Other Problems addressed during stay:  Principal Problem:   Weakness Active Problems:   T2DM (type 2 diabetes mellitus) [facility])   Brief Hospital Course:  Luis [name] is a 52 y.o. male presenting with cocaine use disorder, OSA, T2DM, HTN HFrEF with BivICD, and MDD presenting to [facility] ED as code stroke for sudden onset LE weakness and right-sided deficits.   In the ED, he presented via EMS for acute facial droop, LKW 11 AM, code stroke activated. Evluated by neurology upon arrival, CT head negative for any intracranial process.  CBC, UA and chemistries unremarkable on admission.  Vitals stable.  UDS positive for cocaine, remainder of tox screen negative. Meds given in ED: solumedrol 125 mg injection, benadryl 2x, oxycodone 1x.   Repeat CT head the next morning was also negative, MRA head and MRI brain are unremarkable and negative for any acute intracranial abnormalities.  Neurology has recommended patient be placed on Plavix 75 mg for life, ASA is not an option as he has a history of anaphylactic reactions.  There is concern for a significant behavioral component to his symptoms as right-sided weakness is not consistent on every exam.  On day of discharge HHRN and HHPT were ordered for continued rehabilitation of his right side.      Disposition: Home  Discharge Condition: Stable  Discharge Exam:  Vitals:   10/27/22 0732 10/27/22 1139  BP: (!) 90/54 108/68  Pulse: 85 91  Resp:  20 20  Temp: 97.9 F (36.6 C) 98 F (36.7 C)  SpO2: 90% 95%  Blood pressure 108/68, pulse 91, temperature 98 F (36.7 C), temperature source Oral, resp. rate 20, height 5' 5.5" (1.664 m), weight (!) 140 kg, SpO2 95 %.  General: Morbidly obese, nontoxic-appearing, NAD Neck: Supple, no masses or thyromegaly Cardiovascular: RRR, no M/R/G Respiratory: Normal work of breathing on room air, lung sounds clear anteriorly Gastrointestinal: Distended, not out of proportion to body habitus, nontender, normal active bowel sounds Derm: Warm and dry  Significant Procedures:  none  Significant Labs and Imaging:  CBC    Component Value Date/Time   WBC 8.2 10/25/2022 1136   RBC 4.25 10/25/2022 1136   HGB 12.9 (L) 10/25/2022 1140   HCT 38.0 (L) 10/25/2022 1140   PLT 192 10/25/2022 1136   MCV 92.2 10/25/2022 1136   MCH 29.4 10/25/2022 1136   MCHC 31.9 10/25/2022 1136   RDW 13.3 10/25/2022 1136   LYMPHSABS 1.5 10/25/2022 1136   MONOABS 0.5 10/25/2022 1136   EOSABS 0.1 10/25/2022 1136   BASOSABS 0.0 10/25/2022 1136   CMP     Component Value Date/Time   NA 135 10/26/2022 1636   NA 142 07/13/2022 1436   K 3.8 10/26/2022 1636   CL 100 10/26/2022 1636   CO2 25 10/26/2022 1636   GLUCOSE 206 (H) 10/26/2022 1636   BUN 20 10/26/2022 1636   BUN 9 07/13/2022 1436   CREATININE 1.15 10/26/2022 1636   CALCIUM 9.0 10/26/2022 1636   PROT 5.9 (L) 10/25/2022 1136   ALBUMIN 3.2 (L) 10/25/2022 1136   AST 14 (L) 10/25/2022 1136  ALT 15 10/25/2022 1136   ALKPHOS 40 10/25/2022 1136   BILITOT 0.7 10/25/2022 1136   GFRNONAA >60 10/26/2022 1636   GFRAA >60 09/05/2019 2340   MR BRAIN WO CONTRAST  Result Date: 10/26/2022 CLINICAL DATA:  Stroke, follow up EXAM: MRI HEAD WITHOUT CONTRAST MRA HEAD WITHOUT CONTRAST TECHNIQUE: Multiplanar, multi-echo pulse sequences of the brain and surrounding structures were acquired without intravenous contrast. Angiographic images of the Circle of Willis were acquired using  MRA technique without intravenous contrast. COMPARISON:  None Available. FINDINGS: MRI HEAD FINDINGS Brain: No acute infarction, hemorrhage, hydrocephalus, extra-axial collection or mass lesion. Vascular: Major arterial flow voids are maintained at the skull base. Skull and upper cervical spine: Normal marrow signal. Sinuses/Orbits: No acute or significant finding. Other: No mastoid effusions. MRA HEAD FINDINGS Significantly motion limited study.  Within this limitation: Anterior circulation: He the visualized intracranial ICAs, MCAs and ACAs are patent without proximal high-grade visible stenosis. Posterior circulation: Bilateral intradural vertebral arteries, basilar artery and bilateral posterior cerebral arteries are patent without proximal visible high-grade stenosis. IMPRESSION: MRI: No evidence of acute intracranial abnormality. MRA: Motion limited study without large vessel occlusion or visible proximal high-grade stenosis. Electronically Signed   By: Feliberto Harts M.D.   On: 10/26/2022 15:18   MR ANGIO HEAD WO CONTRAST  Result Date: 10/26/2022 CLINICAL DATA:  Stroke, follow up EXAM: MRI HEAD WITHOUT CONTRAST MRA HEAD WITHOUT CONTRAST TECHNIQUE: Multiplanar, multi-echo pulse sequences of the brain and surrounding structures were acquired without intravenous contrast. Angiographic images of the Circle of Willis were acquired using MRA technique without intravenous contrast. COMPARISON:  None Available. FINDINGS: MRI HEAD FINDINGS Brain: No acute infarction, hemorrhage, hydrocephalus, extra-axial collection or mass lesion. Vascular: Major arterial flow voids are maintained at the skull base. Skull and upper cervical spine: Normal marrow signal. Sinuses/Orbits: No acute or significant finding. Other: No mastoid effusions. MRA HEAD FINDINGS Significantly motion limited study.  Within this limitation: Anterior circulation: He the visualized intracranial ICAs, MCAs and ACAs are patent without proximal  high-grade visible stenosis. Posterior circulation: Bilateral intradural vertebral arteries, basilar artery and bilateral posterior cerebral arteries are patent without proximal visible high-grade stenosis. IMPRESSION: MRI: No evidence of acute intracranial abnormality. MRA: Motion limited study without large vessel occlusion or visible proximal high-grade stenosis. Electronically Signed   By: Feliberto Harts M.D.   On: 10/26/2022 15:18   CT HEAD WO CONTRAST ( )  Result Date: 10/26/2022 CLINICAL DATA:  Stroke follow-up EXAM: CT HEAD WITHOUT CONTRAST TECHNIQUE: Contiguous axial images were obtained from the base of the skull through the vertex without intravenous contrast. RADIATION DOSE REDUCTION: This exam was performed according to the departmental dose-optimization program which includes automated exposure control, adjustment of the mA and/or kV according to patient size and/or use of iterative reconstruction technique. COMPARISON:  CT Head and CTA head/neck 10/25/22 FINDINGS: Brain: No evidence of acute infarction, hemorrhage, hydrocephalus, extra-axial collection or mass lesion/mass effect. Vascular: No hyperdense vessel or unexpected calcification. Skull: Normal. Negative for fracture or focal lesion. Sinuses/Orbits: No middle ear or mastoid effusion. Paranasal sinuses are clear. Orbits are unremarkable. Other: Nonspecific soft tissue thickening in the suboccipital region, possibly posttraumatic. IMPRESSION: No acute intracranial abnormality. Electronically Signed   By: Lorenza Cambridge M.D.   On: 10/26/2022 09:32   CT ANGIO HEAD NECK W WO CM (CODE STROKE)  Result Date: 10/25/2022 CLINICAL DATA:  Stroke suspected EXAM: CT ANGIOGRAPHY HEAD AND NECK WITH AND WITHOUT CONTRAST TECHNIQUE: Multidetector CT imaging of the head and neck was  performed using the standard protocol during bolus administration of intravenous contrast. Multiplanar CT image reconstructions and MIPs were obtained to evaluate the  vascular anatomy. Carotid stenosis measurements (when applicable) are obtained utilizing NASCET criteria, using the distal internal carotid diameter as the denominator. RADIATION DOSE REDUCTION: This exam was performed according to the departmental dose-optimization program which includes automated exposure control, adjustment of the mA and/or kV according to patient size and/or use of iterative reconstruction technique. CONTRAST:  75mL OMNIPAQUE IOHEXOL 350 MG/ML SOLN COMPARISON:  10/25/2022 CT head, CTA head 01/19/2019 FINDINGS: CT HEAD FINDINGS For noncontrast findings, please see same day CT head. CTA NECK FINDINGS Evaluation is somewhat limited by bolus timing, body habitus, and beam hardening artifact from the patient's left chest cardiac device. Aortic arch: Standard branching. Imaged portion shows no evidence of aneurysm or dissection. No significant stenosis of the major arch vessel origins. Right carotid system: No evidence of stenosis, dissection, or occlusion. Left carotid system: No evidence of stenosis, dissection, or occlusion. Vertebral arteries: Poor visualization of the origin of the left vertebral artery. The remainder of the left vertebral artery is patent to the skull base without significant stenosis or dissection. The right vertebral artery is patent from its origin to the skull base, without stenosis or dissection. Skeleton: No acute osseous abnormality. Degenerative changes in the cervical spine. Other neck: No acute finding. Upper chest: No focal pulmonary opacity or pleural effusion. Review of the MIP images confirms the above findings CTA HEAD FINDINGS Evaluation is limited by bolus timing. Anterior circulation: Both internal carotid arteries are patent to the termini, without significant stenosis. A1 segments patent. Normal anterior communicating artery. Anterior cerebral arteries are patent to their distal aspects without significant stenosis. No M1 stenosis or occlusion. Poor  visualization of the anterior left MCA branches (series 8, image 299), which could indicate stenosis or be related to bolus timing. The posterior left MCA branches appear patent without significant stenosis. The right MCA branches appear patent without significant stenosis. Posterior circulation: Vertebral arteries patent to the vertebrobasilar junction without significant stenosis. Posterior inferior cerebellar arteries patent proximally. Basilar patent to its distal aspect without significant stenosis. Superior cerebellar arteries patent proximally. Patent P1 segments. PCAs perfused to their distal aspects without significant stenosis. The bilateral posterior communicating arteries are not visualized. Venous sinuses: As permitted by contrast timing, patent. Anatomic variants: None significant. Review of the MIP images confirms the above findings IMPRESSION: 1. Evaluation is somewhat limited by bolus timing, body habitus, and beam hardening artifact from the patient's left chest cardiac device. 2. Poor visualization of the anterior left MCA branches, which could indicate stenosis or be related to bolus timing. 3. No other intracranial large vessel occlusion or significant stenosis. 4. No hemodynamically significant stenosis in the neck. Code stroke imaging results were communicated on 10/25/2022 at 3:57 pm to provider Dr. Derry Lory via telephone, who verbally acknowledged these results. Electronically Signed   By: Wiliam Ke M.D.   On: 10/25/2022 16:14   Results/Tests Pending at Time of Discharge: None  Discharge Medications:  Allergies as of 10/27/2022       Reactions   Aspirin Anaphylaxis, Other (See Comments)   Throat closing   Iodine-131 Hives   Peanut (diagnostic) Anaphylaxis   Throat closes   Iodinated Contrast Media Hives   Can be premedicated    Latex Hives, Rash   Penicillins Nausea And Vomiting   Ultram [tramadol] Hives, Nausea Only   Zestril [lisinopril] Cough   Cinnamon    Augmentin  [  amoxicillin-pot Clavulanate] Diarrhea   Lidocaine Rash   Rash from adhesive on lidocaine patches         Medication List     TAKE these medications    acetaminophen 325 MG tablet Commonly known as: TYLENOL Take 2 tablets (650 mg total) by mouth every 6 (six) hours as needed for mild pain.   ARIPiprazole 5 MG tablet Commonly known as: ABILIFY Take 1 tablet (5 mg total) by mouth daily.   carvedilol 3.125 MG tablet Commonly known as: COREG Take 3.125 mg by mouth 2 (two) times daily with a meal.   clopidogrel 75 MG tablet Commonly known as: PLAVIX Take 1 tablet (75 mg total) by mouth daily. Start taking on: Oct 28, 2022   dapagliflozin propanediol 10 MG Tabs tablet Commonly known as: FARXIGA Take 1 tablet (10 mg total) by mouth daily.   diclofenac Sodium 1 % Gel Commonly known as: VOLTAREN Apply 4 g topically 4 (four) times daily as needed (for lower back pain).   digoxin 0.125 MG tablet Commonly known as: LANOXIN Take 0.125 mg by mouth daily.   DULoxetine 30 MG capsule Commonly known as: CYMBALTA Take 3 capsules (90 mg total) by mouth daily.   Entresto 97-103 MG Generic drug: sacubitril-valsartan Take 1 tablet by mouth 2 (two) times daily.   gabapentin 300 MG capsule Commonly known as: NEURONTIN Take 1 capsule (300 mg total) by mouth 3 (three) times daily.   gatifloxacin 0.5 % Soln Commonly known as: ZYMAXID Place 1 drop into both eyes 4 (four) times daily.   hydrOXYzine 25 MG tablet Commonly known as: ATARAX Take 1 tablet (25 mg total) by mouth 3 (three) times daily as needed for anxiety.   insulin aspart protamine- aspart (70-30) 100 UNIT/ML injection Commonly known as: NOVOLOG MIX 70/30 Inject 0.7 mLs (70 Units total) into the skin 2 (two) times daily with breakfast and lunch.   insulin glargine-yfgn 100 UNIT/ML injection Commonly known as: SEMGLEE Inject 0.2 mLs (20 Units total) into the skin at bedtime.   melatonin 3 MG Tabs tablet Take 1 tablet  (3 mg total) by mouth at bedtime.   potassium chloride SA 20 MEQ tablet Commonly known as: KLOR-CON M Take 20 mEq by mouth daily.   prazosin 2 MG capsule Commonly known as: MINIPRESS Take 1 capsule (2 mg total) by mouth at bedtime.   prednisoLONE acetate 1 % ophthalmic suspension Commonly known as: PRED FORTE Place 1 drop into both eyes 2 (two) times daily.   rosuvastatin 40 MG tablet Commonly known as: CRESTOR Take 1 tablet (40 mg total) by mouth daily. What changed:  medication strength how much to take   spironolactone 25 MG tablet Commonly known as: ALDACTONE Take 1 tablet (25 mg total) by mouth daily.   torsemide 20 MG tablet Commonly known as: DEMADEX Take 3 tablets (60 mg total) by mouth daily.   trazodone 300 MG tablet Commonly known as: DESYREL Take 1 tablet (300 mg total) by mouth at bedtime as needed for sleep.        Discharge Instructions: Please refer to Patient Instructions section of EMR for full details.  Patient was counseled important signs and symptoms that should prompt return to medical care, changes in medications, dietary instructions, activity restrictions, and follow up appointments.   Follow-Up Appointments:  Follow-up Information     [name], MD. Schedule an appointment as soon as possible for a visit in 1 month(s).   Specialties: Neurology, Radiology Contact information: [address]  [address] [place] [address] [phone]                 Lorri Frederick, MD 10/27/2022, 1:02 PM PGY-1, [facility] Family Medicine    I have evaluated this patient along with Dr. Theodis Aguas and reviewed the above note, making necessary revisions.  Dorothyann Gibbs, MD 10/27/2022, 1:15 PM PGY-2, [facility] Family Medicine

## 2022-10-27 ENCOUNTER — Other Ambulatory Visit (HOSPITAL_COMMUNITY): Payer: Self-pay

## 2022-10-27 DIAGNOSIS — R531 Weakness: Secondary | ICD-10-CM | POA: Diagnosis not present

## 2022-10-27 LAB — GLUCOSE, CAPILLARY
Glucose-Capillary: 183 mg/dL — ABNORMAL HIGH (ref 70–99)
Glucose-Capillary: 164 mg/dL — ABNORMAL HIGH (ref 70–99)

## 2022-10-27 MED ORDER — CLOPIDOGREL BISULFATE 75 MG PO TABS
75.0000 mg | ORAL_TABLET | Freq: Every day | ORAL | 0 refills | Status: DC
  Filled 2022-10-27: qty 30, 30d supply, fill #0

## 2022-10-27 NOTE — Progress Notes (Signed)
Discharge instructions discussed with patient. All questions answered. TOC meds delivered to bedside. Iv's removed. Voucher given for transport home.   Melony Overly, RN

## 2022-10-27 NOTE — Evaluation (Signed)
Occupational Therapy Evaluation Patient Details Name: Luis [name] MRN: [id number] DOB: 01-17-1971 Today's Date: 10/27/2022   History of Present Illness Pt is a 52 y.o. M who presents 10/25/2022 for evaluation of right sided weakness. Significant PMH: anxiety, PTSD, cocaine use, chronic systolic CHF, HTN, tobacco use, NICM, OSA, morbid obesity, VT s/p biventricular ICD.   Clinical Impression   PTA patient reports living alone, managing ADLs and mobility using rollator.  Inconsistent report compared to PT eval yesterday, denies girlfriend coming to assist him but does report having aide (although hasn't set it up yet).  Admitted for above and presents with problem lsit below.  Some inconsistencies with R UE strength and functional use during session, pt dragging R foot during mobility tasks, and demonstrating poor problem solving, processing, and attention.  He reports May 2023, demonstrates difficulty counting from 1-20 (missing several numbers initially) and requires cueing to attend to asks.  He is able to complete ADLs with up to min assist, transfers with min assist using RW.  Notified RN of cognition and mobility during session. IF pt has support at home, anticipate he would benefit from [facility] services at dc; if pt does not have support, would recommend continued OT services at inpatient setting with <3hrs/day. Would recommend assist with all IADLs (meds, meals, driving, and fiances).  Will follow acutely.      Recommendations for follow up therapy are one component of a multi-disciplinary discharge planning process, led by the attending physician.  Recommendations may be updated based on patient status, additional functional criteria and insurance authorization.   Assistance Recommended at Discharge Frequent or constant Supervision/Assistance  Patient can return home with the following A little help with walking and/or transfers;A little help with bathing/dressing/bathroom;Assistance with  cooking/housework;Direct supervision/assist for medications management;Direct supervision/assist for financial management;Assist for transportation;Help with stairs or ramp for entrance    Functional Status Assessment  Patient has had a recent decline in their functional status and demonstrates the ability to make significant improvements in function in a reasonable and predictable amount of time.  Equipment Recommendations  None recommended by OT    Recommendations for Other Services       Precautions / Restrictions Precautions Precautions: Fall Restrictions Weight Bearing Restrictions: No      Mobility Bed Mobility Overal bed mobility: Needs Assistance Bed Mobility: Sidelying to Sit, Sit to Supine   Sidelying to sit: Supervision   Sit to supine: Supervision   General bed mobility comments: pt sidelying on R side, able to manage to EOB with cueing but no physical assistance.  Returned to bed without assist.    Transfers Overall transfer level: Needs assistance Equipment used: Rolling walker (2 wheels) Transfers: Sit to/from Stand Sit to Stand: Min assist           General transfer comment: cueing for hand placement, pt placing R hand on RW with L UE. Min assist to steady as powering up.      Balance Overall balance assessment: Needs assistance Sitting-balance support: No upper extremity supported, Feet supported Sitting balance-Leahy Scale: Fair     Standing balance support: Bilateral upper extremity supported, During functional activity Standing balance-Leahy Scale: Fair Standing balance comment: BUE support on RW                           ADL either performed or assessed with clinical judgement   ADL Overall ADL's : Needs assistance/impaired     Grooming: Minimal assistance;Sitting  Upper Body Dressing : Minimal assistance;Sitting   Lower Body Dressing: Minimal assistance;Sit to/from stand   Toilet Transfer: Minimal  assistance;Ambulation;Rolling walker (2 wheels) Toilet Transfer Details (indicate cue type and reason): pt dragging R foot to ambulate         Functional mobility during ADLs: Minimal assistance;Rolling walker (2 wheels);Cueing for safety       Vision   Vision Assessment?: No apparent visual deficits Additional Comments: able to read clock without difficulty     Perception     Praxis      Pertinent Vitals/Pain Pain Assessment Pain Assessment: Faces Faces Pain Scale: Hurts a little bit Pain Location: HA Pain Descriptors / Indicators: Headache Pain Intervention(s): Monitored during session, Repositioned     Hand Dominance Right   Extremity/Trunk Assessment Upper Extremity Assessment Upper Extremity Assessment: RUE deficits/detail;LUE deficits/detail RUE Deficits / Details: pt inconsistent- unable to raise UE during testing, but when asked to "which arm have they been testing your BP in?' able to raise to 90* without difficulty.  grasp 3/5 MMT. reports tingling. RUE Sensation: decreased light touch RUE Coordination: decreased fine motor;decreased gross motor LUE Deficits / Details: WFL, grossly 5/5 LUE Sensation: WNL LUE Coordination: WNL   Lower Extremity Assessment Lower Extremity Assessment: Defer to PT evaluation   Cervical / Trunk Assessment Cervical / Trunk Assessment: Other exceptions Cervical / Trunk Exceptions: increased body habitus   Communication Communication Communication: No difficulties   Cognition Arousal/Alertness: Awake/alert Behavior During Therapy: WFL for tasks assessed/performed, Flat affect Overall Cognitive Status: No family/caregiver present to determine baseline cognitive functioning Area of Impairment: Orientation, Attention, Memory, Following commands, Safety/judgement, Awareness, Problem solving                 Orientation Level: Disoriented to, Time (did not test place) Current Attention Level: Focused, Sustained Memory:  Decreased short-term memory Following Commands: Follows one step commands consistently, Follows one step commands with increased time, Follows multi-step commands inconsistently Safety/Judgement: Decreased awareness of safety, Decreased awareness of deficits Awareness: Intellectual Problem Solving: Slow processing, Requires verbal cues, Difficulty sequencing, Decreased initiation General Comments: patient reports May 2023, able to correct with minimal cueing.  Pt with slow processing and at times requires cueing to recall and initate tasks.  Inconsistencies noted with R UE weakness and sequencing tasks.  Difficulty counting from 1-20 initally (1,3,4,6 then stopped), but then utilized the clock seconds to count to 20. Unsure baseline     General Comments  VSS- SpO2 on RA 94-98%, BP 107/62 (75)    Exercises     Shoulder Instructions      Home Living Family/patient expects to be discharged to:: Private residence Living Arrangements: Alone Available Help at Discharge:  (reports none) Type of Home: Apartment Home Access: Level entry     Home Layout: One level     Bathroom Shower/Tub: Producer, television/film/video: Standard Bathroom Accessibility: Yes   Home Equipment: Adaptive equipment;Rolling Walker (2 wheels);Shower seat;Other (comment) (oxgyen) Adaptive Equipment: Sock aid Additional Comments: reports 2.5L O2 at home all the time; reports aide setup but hasn't started yet (not sure how much)      Prior Functioning/Environment Prior Level of Function : Needs assist;History of Falls (last six months);Driving             Mobility Comments: 2 falls within past 3 months, using Rollator ADLs Comments: managing as able, sits to cook if needed but eats out; independent ADLs (sits in shower)  OT Problem List: Decreased strength;Decreased activity tolerance;Impaired balance (sitting and/or standing);Decreased cognition;Decreased coordination;Decreased range of  motion;Decreased safety awareness;Decreased knowledge of use of DME or AE;Decreased knowledge of precautions;Obesity;Impaired UE functional use      OT Treatment/Interventions: Self-care/ADL training;Therapeutic exercise;DME and/or AE instruction;Therapeutic activities;Patient/family education;Balance training;Cognitive remediation/compensation;Neuromuscular education    OT Goals(Current goals can be found in the care plan section) Acute Rehab OT Goals Patient Stated Goal: get better OT Goal Formulation: With patient Time For Goal Achievement: 11/10/22 Potential to Achieve Goals: Good  OT Frequency: Min 2X/week    Co-evaluation              AM-PAC OT "6 Clicks" Daily Activity     Outcome Measure Help from another person eating meals?: A Little Help from another person taking care of personal grooming?: A Little Help from another person toileting, which includes using toliet, bedpan, or urinal?: A Little Help from another person bathing (including washing, rinsing, drying)?: A Little Help from another person to put on and taking off regular upper body clothing?: A Little Help from another person to put on and taking off regular lower body clothing?: A Little 6 Click Score: 18   End of Session Equipment Utilized During Treatment: Gait belt;Rolling walker (2 wheels) Nurse Communication: Mobility status;Other (comment) (cog changes?)  Activity Tolerance: Patient tolerated treatment well Patient left: in bed;with call bell/phone within reach;with bed alarm set  OT Visit Diagnosis: Other abnormalities of gait and mobility (R26.89);Muscle weakness (generalized) (M62.81);Other symptoms and signs involving cognitive function                Time: 1610-9604 OT Time Calculation (min): 30 min Charges:  OT General Charges $OT Visit: 1 Visit OT Evaluation $OT Eval Moderate Complexity: 1 Mod OT Treatments $Self Care/Home Management : 8-22 mins  Barry Brunner, OT Acute Rehabilitation  Services Office [phone]   Chancy Milroy 10/27/2022, 10:49 AM

## 2022-10-27 NOTE — Discharge Instructions (Addendum)
[name], Luis [name] were admitted for newfound right-sided weakness and evaluated for a possible stroke.  While you were hospitalized the neurology team was consulted and likely there were no signs of strokes found on your x-rays and MRI.  Although you continue to experience weakness on your right side, we have made sure that you go home with the right tools to ensure you get back your strength.  We are happy to see that you are ready for discharge today, please see the detailed instructions below regarding changes to your medications and upcoming appointments.  You will take the following NEW medications: - Please start Plavix 75 mg once daily, this medication is a blood thinner that will help prevent strokes - Your Crestor has been increased from 20 mg to 40 mg once a day  You can continue all of your other home medications.  We have also ordered home health physical therapy and a home health nurse to help rehabilitate your right arm and right leg.   You will have follow-up with the neurologist in 4 weeks, your appointment will be with Dr. Frances Furbish. [facility] [address]  [place] [address]  Phone: [phone]

## 2022-10-27 NOTE — TOC Initial Note (Addendum)
Transition of Care (TOC) - Initial/Assessment Note   Spoke to PT/OT   Spoke to patient at bedside.   Confirmed face sheet information.   Patient from home alone. Has a girlfriend that he states checks on him. He will need transportation home at discharge.   He is arranging home health aide through Medicaid, with [facility]. His home assessment visit was while he was in the hospital so he will reschedule.   Documented  cocaine use disorder,  and MDD . NCM explained potential barriers to home health. Patient states he only used cocaine one time which was 4 days ago, and never used cocaine prior . Does not want resources   Patient has a shower chair and rollator at home.Reported no housing or food insecurity   Tresa Endo with [facility] reviewing chart with her supervisor to see if they would consider accepting.   Kelly with [facility] accepted asking for orders and face to face for [facility] and HHPT  . Secure chatted attending team . Once [facility] receives signed orders they will submit to [facility] for auth, once auth received they will start seeing patient.  Patient Details  Name: Luis [name] MRN: [id number] Date of Birth: [birth year]  Transition of Care [facility]) CM/SW Contact:    Kingsley Plan, RN Phone Number: 10/27/2022, 10:52 AM  Clinical Narrative:                   Expected Discharge Plan: Home w Home Health Services     Patient Goals and CMS Choice   CMS Medicare.gov Compare Post Acute Care list provided to:: Patient Choice offered to / list presented to : Patient [place] ownership interest in [facility].provided to:: Patient    Expected Discharge Plan and Services   Discharge Planning Services: CM Consult Post Acute Care Choice: Home Health                   DME Arranged: N/A         HH Arranged: PT          Prior Living Arrangements/Services   Lives with:: Self   Do you feel safe going back to the place where you live?: Yes           Current home services: DME Criminal Activity/Legal Involvement Pertinent to Current Situation/Hospitalization: No - Comment as needed  Activities of Daily Living Home Assistive Devices/Equipment: None ADL Screening (condition at time of admission) Patient's cognitive ability adequate to safely complete daily activities?: Yes Is the patient deaf or have difficulty hearing?: No Does the patient have difficulty seeing, even when wearing glasses/contacts?: No Does the patient have difficulty concentrating, remembering, or making decisions?: No Patient able to express need for assistance with ADLs?: Yes Does the patient have difficulty dressing or bathing?: No Independently performs ADLs?: Yes (appropriate for developmental age) Does the patient have difficulty walking or climbing stairs?: No Weakness of Legs: Right Weakness of Arms/Hands: Right  Permission Sought/Granted   Permission granted to share information with : No              Emotional Assessment Appearance:: Appears stated age Attitude/Demeanor/Rapport: Other (comment) (flat)   Orientation: : Oriented to Self, Oriented to Place, Oriented to  Time, Oriented to Situation Alcohol / Substance Use: Illicit Drugs Psych Involvement: No (comment)  Admission diagnosis:  Weakness [R53.1] Right sided weakness [R53.1] Patient Active Problem List   Diagnosis Date Noted   Weakness 10/25/2022   CHF (congestive heart failure) (HCC)  04/16/2022   Acute on chronic systolic heart failure, NYHA class 3 (HCC) 04/16/2022   Fall 03/24/2022   Biventricular ICD (implantable cardioverter-defibrillator) in place    Lightheadedness    Acute on chronic combined systolic and diastolic CHF (congestive heart failure) (HCC) 02/26/2022   Cocaine abuse (HCC) 02/26/2022   Chronic respiratory failure with hypoxia (HCC) - 2.5 L/min 02/26/2022   Sleep apnea 08/07/2021   MDD (major depressive disorder), recurrent severe, without psychosis (HCC)  08/06/2021   Moderate episode of recurrent major depressive disorder (HCC) 05/08/2021   Generalized anxiety disorder 05/08/2021   Unspecified mood (affective) disorder (HCC) 05/08/2021   Insomnia 05/08/2021   Cocaine use disorder, moderate, dependence (HCC) 04/02/2021   Right arm weakness 01/28/2021   Right sided weakness 01/28/2021   Non-ischemic cardiomyopathy (HCC)    Morbid obesity (HCC) 05/05/2019   Chronic systolic CHF (congestive heart failure) (HCC) 01/17/2019   Pacemaker    HLD (hyperlipidemia)    T2DM (type 2 diabetes mellitus) (HCC)    Hypertension    GERD (gastroesophageal reflux disease)    Tobacco abuse    PCP:  Margot Ables, MD (Inactive) Pharmacy:   [facility] [phone] - [place], [place] - [address] AT [facility] [address] [place] [address] Phone: [phone] Fax: [phone]     Social Determinants of Health (SDOH) Social History: SDOH Screenings   Food Insecurity: Food Insecurity Present (10/26/2022)  Housing: Medium Risk (10/26/2022)  Transportation Needs: No Transportation Needs (10/26/2022)  Utilities: Not At Risk (10/26/2022)  Alcohol Screen: Low Risk  (10/16/2022)  Depression (PHQ2-9): Medium Risk (03/30/2022)  Financial Resource Strain: Low Risk  (03/17/2022)  Tobacco Use: High Risk (10/25/2022)   SDOH Interventions:     Readmission Risk Interventions    01/31/2021   12:26 PM 01/31/2021    9:25 AM  Readmission Risk Prevention Plan  Transportation Screening Complete   PCP or Specialist Appt within 3-5 Days Complete Complete  HRI or Home Care Consult Complete Complete  Social Work Consult for Recovery Care Planning/Counseling Patient refused Complete  Palliative Care Screening Not Applicable Not Applicable  Medication Review Oceanographer) Complete Referral to Pharmacy

## 2022-11-02 ENCOUNTER — Telehealth (HOSPITAL_COMMUNITY): Payer: Self-pay | Admitting: Licensed Clinical Social Worker
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
## 2022-11-16 ENCOUNTER — Telehealth (HOSPITAL_COMMUNITY): Payer: Self-pay | Admitting: Licensed Clinical Social Worker
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
## 2022-11-30 ENCOUNTER — Telehealth (HOSPITAL_COMMUNITY): Payer: Self-pay | Admitting: Licensed Clinical Social Worker

## 2022-11-30 NOTE — Telephone Encounter (Signed)
H&V Care Navigation CSW Progress Note  Clinical Social Worker called pt to check in.  Pt is back home following [facility] stay.  He reports he is doing ok.  Still somewhat in his head but not in crisis like he was before.  Has been speaking a lot with his minister and this has been helping and is talking with [facility] regarding [facility] with his mental health.  Sister is helping with energy bill  No further needs at this time   SDOH Screenings   Food Insecurity: Food Insecurity Present (11/16/2022)  Housing: Medium Risk (10/26/2022)  Transportation Needs: Unmet Transportation Needs (11/16/2022)  Utilities: Not At Risk (10/26/2022)  Alcohol Screen: Low Risk  (10/16/2022)  Depression (PHQ2-9): Medium Risk (11/16/2022)  Financial Resource Strain: High Risk (11/16/2022)  Tobacco Use: High Risk (10/25/2022)     Burna Sis, LCSW Clinical Social Worker [facility] Desk#: [phone] Cell#: [phone]

## 2022-12-03 ENCOUNTER — Other Ambulatory Visit: Payer: Self-pay

## 2022-12-03 ENCOUNTER — Ambulatory Visit (HOSPITAL_COMMUNITY)
Admission: EM | Admit: 2022-12-03 | Discharge: 2022-12-03 | Disposition: A | Discharge status: Other Acute Inpatient Hospital | Payer: Medicare HMO | Attending: Behavioral Health | Admitting: Behavioral Health

## 2022-12-03 ENCOUNTER — Encounter (HOSPITAL_COMMUNITY): Payer: Self-pay

## 2022-12-03 ENCOUNTER — Emergency Department (HOSPITAL_COMMUNITY)
Admission: EM | Admit: 2022-12-03 | Discharge: 2022-12-04 | Disposition: A | Discharge status: Other Acute Inpatient Hospital | Payer: Medicare HMO | Attending: Emergency Medicine | Admitting: Emergency Medicine

## 2022-12-03 DIAGNOSIS — Z7902 Long term (current) use of antithrombotics/antiplatelets: Secondary | ICD-10-CM | POA: Insufficient documentation

## 2022-12-03 DIAGNOSIS — Z91040 Latex allergy status: Secondary | ICD-10-CM | POA: Diagnosis not present

## 2022-12-03 DIAGNOSIS — F17210 Nicotine dependence, cigarettes, uncomplicated: Secondary | ICD-10-CM | POA: Insufficient documentation

## 2022-12-03 DIAGNOSIS — E119 Type 2 diabetes mellitus without complications: Secondary | ICD-10-CM | POA: Diagnosis not present

## 2022-12-03 DIAGNOSIS — I110 Hypertensive heart disease with heart failure: Secondary | ICD-10-CM | POA: Diagnosis not present

## 2022-12-03 DIAGNOSIS — F411 Generalized anxiety disorder: Secondary | ICD-10-CM | POA: Diagnosis not present

## 2022-12-03 DIAGNOSIS — F32A Depression, unspecified: Secondary | ICD-10-CM

## 2022-12-03 DIAGNOSIS — I5022 Chronic systolic (congestive) heart failure: Secondary | ICD-10-CM | POA: Diagnosis not present

## 2022-12-03 DIAGNOSIS — R5383 Other fatigue: Secondary | ICD-10-CM

## 2022-12-03 DIAGNOSIS — Z79899 Other long term (current) drug therapy: Secondary | ICD-10-CM | POA: Diagnosis not present

## 2022-12-03 DIAGNOSIS — R45851 Suicidal ideations: Secondary | ICD-10-CM | POA: Diagnosis present

## 2022-12-03 DIAGNOSIS — I509 Heart failure, unspecified: Secondary | ICD-10-CM | POA: Insufficient documentation

## 2022-12-03 DIAGNOSIS — E785 Hyperlipidemia, unspecified: Secondary | ICD-10-CM | POA: Diagnosis not present

## 2022-12-03 DIAGNOSIS — Z950 Presence of cardiac pacemaker: Secondary | ICD-10-CM | POA: Diagnosis not present

## 2022-12-03 DIAGNOSIS — F322 Major depressive disorder, single episode, severe without psychotic features: Secondary | ICD-10-CM | POA: Insufficient documentation

## 2022-12-03 DIAGNOSIS — F1490 Cocaine use, unspecified, uncomplicated: Secondary | ICD-10-CM | POA: Insufficient documentation

## 2022-12-03 DIAGNOSIS — F1410 Cocaine abuse, uncomplicated: Secondary | ICD-10-CM | POA: Diagnosis not present

## 2022-12-03 LAB — CBC WITH DIFFERENTIAL/PLATELET
Abs Immature Granulocytes: 0.02 10*3/uL (ref 0.00–0.07)
Basophils Absolute: 0 10*3/uL (ref 0.0–0.1)
Basophils Relative: 0 %
Eosinophils Absolute: 0.1 10*3/uL (ref 0.0–0.5)
Eosinophils Relative: 1 %
HCT: 40.1 % (ref 39.0–52.0)
Hemoglobin: 12.9 g/dL — ABNORMAL LOW (ref 13.0–17.0)
Immature Granulocytes: 0 %
Lymphocytes Relative: 28 %
Lymphs Abs: 2.4 10*3/uL (ref 0.7–4.0)
MCH: 28.7 pg (ref 26.0–34.0)
MCHC: 32.2 g/dL (ref 30.0–36.0)
MCV: 89.3 fL (ref 80.0–100.0)
Monocytes Absolute: 0.7 10*3/uL (ref 0.1–1.0)
Monocytes Relative: 8 %
Neutro Abs: 5.4 10*3/uL (ref 1.7–7.7)
Neutrophils Relative %: 63 %
Platelets: 221 10*3/uL (ref 150–400)
RBC: 4.49 MIL/uL (ref 4.22–5.81)
RDW: 12.7 % (ref 11.5–15.5)
WBC: 8.5 10*3/uL (ref 4.0–10.5)
nRBC: 0 % (ref 0.0–0.2)

## 2022-12-03 LAB — COMPREHENSIVE METABOLIC PANEL
ALT: 18 U/L (ref 0–44)
AST: 22 U/L (ref 15–41)
Albumin: 3 g/dL — ABNORMAL LOW (ref 3.5–5.0)
Alkaline Phosphatase: 51 U/L (ref 38–126)
Anion gap: 10 (ref 5–15)
BUN: 8 mg/dL (ref 6–20)
CO2: 22 mmol/L (ref 22–32)
Calcium: 8.6 mg/dL — ABNORMAL LOW (ref 8.9–10.3)
Chloride: 102 mmol/L (ref 98–111)
Creatinine, Ser: 0.94 mg/dL (ref 0.61–1.24)
GFR, Estimated: >60 mL/min (ref >60)
Glucose, Bld: 406 mg/dL — ABNORMAL HIGH (ref 70–99)
Potassium: 4 mmol/L (ref 3.5–5.1)
Sodium: 134 mmol/L — ABNORMAL LOW (ref 135–145)
Total Bilirubin: 0.3 mg/dL (ref 0.3–1.2)
Total Protein: 5.7 g/dL — ABNORMAL LOW (ref 6.5–8.1)

## 2022-12-03 LAB — CBG MONITORING, ED
Glucose-Capillary: 386 mg/dL — ABNORMAL HIGH (ref 70–99)
Glucose-Capillary: 308 mg/dL — ABNORMAL HIGH (ref 70–99)
Glucose-Capillary: 219 mg/dL — ABNORMAL HIGH (ref 70–99)
Glucose-Capillary: 206 mg/dL — ABNORMAL HIGH (ref 70–99)

## 2022-12-03 LAB — ETHANOL: Alcohol, Ethyl (B): <10 mg/dL (ref <10)

## 2022-12-03 LAB — RAPID URINE DRUG SCREEN, HOSP PERFORMED
Amphetamines: NOT DETECTED
Barbiturates: NOT DETECTED
Benzodiazepines: NOT DETECTED
Cocaine: POSITIVE — AB
Opiates: NOT DETECTED
Tetrahydrocannabinol: NOT DETECTED

## 2022-12-03 LAB — ACETAMINOPHEN LEVEL: Acetaminophen (Tylenol), Serum: <10 ug/mL — ABNORMAL LOW (ref 10–30)

## 2022-12-03 LAB — SALICYLATE LEVEL: Salicylate Lvl: <7 mg/dL — ABNORMAL LOW (ref 7.0–30.0)

## 2022-12-03 MED ORDER — CARVEDILOL 3.125 MG PO TABS
3.1250 mg | ORAL_TABLET | Freq: Two times a day (BID) | ORAL | Status: DC
  Administered 2022-12-04: 3.125 mg via ORAL
  Filled 2022-12-03: qty 1

## 2022-12-03 MED ORDER — TORSEMIDE 20 MG PO TABS
60.0000 mg | ORAL_TABLET | Freq: Every day | ORAL | Status: DC
  Administered 2022-12-03: 60 mg via ORAL
  Filled 2022-12-03 (×2): qty 3

## 2022-12-03 MED ORDER — PRAZOSIN HCL 2 MG PO CAPS
2.0000 mg | ORAL_CAPSULE | Freq: Every day | ORAL | Status: DC
  Administered 2022-12-03: 2 mg via ORAL
  Filled 2022-12-03 (×2): qty 1

## 2022-12-03 MED ORDER — DIGOXIN 125 MCG PO TABS
0.1250 mg | ORAL_TABLET | Freq: Every day | ORAL | Status: DC
  Administered 2022-12-03 – 2022-12-04 (×2): 0.125 mg via ORAL
  Filled 2022-12-03 (×2): qty 1

## 2022-12-03 MED ORDER — ROSUVASTATIN CALCIUM 20 MG PO TABS
40.0000 mg | ORAL_TABLET | Freq: Every day | ORAL | Status: DC
  Administered 2022-12-03 – 2022-12-04 (×2): 40 mg via ORAL
  Filled 2022-12-03 (×2): qty 2

## 2022-12-03 MED ORDER — INSULIN ASPART 100 UNIT/ML IJ SOLN
10.0000 [IU] | Freq: Once | INTRAMUSCULAR | Status: AC
  Administered 2022-12-03: 10 [IU] via SUBCUTANEOUS

## 2022-12-03 MED ORDER — DAPAGLIFLOZIN PROPANEDIOL 10 MG PO TABS
10.0000 mg | ORAL_TABLET | Freq: Every day | ORAL | Status: DC
  Administered 2022-12-03 – 2022-12-04 (×2): 10 mg via ORAL
  Filled 2022-12-03 (×2): qty 1

## 2022-12-03 MED ORDER — POTASSIUM CHLORIDE CRYS ER 20 MEQ PO TBCR
20.0000 meq | EXTENDED_RELEASE_TABLET | Freq: Every day | ORAL | Status: DC
  Administered 2022-12-03 – 2022-12-04 (×2): 20 meq via ORAL
  Filled 2022-12-03 (×2): qty 1

## 2022-12-03 MED ORDER — INSULIN ASPART 100 UNIT/ML IJ SOLN: 8.0000 [IU] | Freq: Once | INTRAMUSCULAR | Status: DC

## 2022-12-03 MED ORDER — INSULIN ASPART 100 UNIT/ML IJ SOLN
8.0000 [IU] | Freq: Once | INTRAMUSCULAR | Status: AC
  Administered 2022-12-03: 8 [IU] via SUBCUTANEOUS

## 2022-12-03 MED ORDER — INSULIN ASPART PROT & ASPART (70-30 MIX) 100 UNIT/ML ~~LOC~~ SUSP
70.0000 [IU] | Freq: Two times a day (BID) | SUBCUTANEOUS | Status: DC
  Administered 2022-12-04: 70 [IU] via SUBCUTANEOUS
  Filled 2022-12-03: qty 10

## 2022-12-03 MED ORDER — HYDROXYZINE HCL 25 MG PO TABS: 25.0000 mg | ORAL_TABLET | Freq: Three times a day (TID) | ORAL | Status: DC | PRN

## 2022-12-03 MED ORDER — INSULIN GLARGINE-YFGN 100 UNIT/ML ~~LOC~~ SOLN
20.0000 [IU] | Freq: Every day | SUBCUTANEOUS | Status: DC
  Administered 2022-12-03: 20 [IU] via SUBCUTANEOUS
  Filled 2022-12-03 (×2): qty 0.2

## 2022-12-03 MED ORDER — CLOPIDOGREL BISULFATE 75 MG PO TABS
75.0000 mg | ORAL_TABLET | Freq: Every day | ORAL | Status: DC
  Administered 2022-12-03: 75 mg via ORAL
  Filled 2022-12-03 (×2): qty 1

## 2022-12-03 NOTE — ED Notes (Signed)
Patient is being transferred to [facility] for medical clearance. Patient reports he took (5) Prazosin 3 mg capsules instead of taking the prescribed amount. Writer called non-emergency for transport and spoke with the charge nurse at [facility], Casimiro Needle, RN.

## 2022-12-03 NOTE — ED Notes (Signed)
Pt ambulating to bathroom, steady gait noted.

## 2022-12-03 NOTE — ED Provider Notes (Addendum)
[name] EMERGENCY DEPARTMENT AT [facility] Provider Note   CSN: [id number] Arrival date & time: 12/03/22  1425     History  Chief Complaint  Patient presents with   Medical Clearance    Luis [name] is a 52 y.o. male.  HPI   52 year old male presents the emergency department from behavioral health for medical clearance.  He was reportedly brought to [facility] behavioral health by law enforcement under voluntary circumstances for psychiatric evaluation.  However while he was there he was noted to be lethargic and admitted to taking an extra dose of the medicine that he takes at night to help with nightmares.  They sent him here for medical clearance.  On my evaluation patient has no acute complaints.  He is conversational, clear speech.  States that he took the extra dose of medicine to try to help him sleep as he has been on a "bender for the past couple days".  He denies any SI/HI or self-harm.  Home Medications Prior to Admission medications   Medication Sig Start Date End Date Taking? Authorizing Provider  acetaminophen (TYLENOL) 325 MG tablet Take 2 tablets (650 mg total) by mouth every 6 (six) hours as needed for mild pain. 10/26/22   Evette Georges, MD  ARIPiprazole (ABILIFY) 5 MG tablet Take 1 tablet (5 mg total) by mouth daily. 10/18/22 11/17/22  Karie Fetch, MD  carvedilol (COREG) 3.125 MG tablet Take 3.125 mg by mouth 2 (two) times daily with a meal.    [provider]  clopidogrel (PLAVIX) 75 MG tablet Take 1 tablet (75 mg total) by mouth daily. 10/28/22   Carrion-Carrero, Karle Starch, MD  dapagliflozin propanediol (FARXIGA) 10 MG TABS tablet Take 1 tablet (10 mg total) by mouth daily. 05/25/22   Laurey Morale, MD  digoxin (LANOXIN) 0.125 MG tablet Take 0.125 mg by mouth daily.    [provider]  DULoxetine (CYMBALTA) 30 MG capsule Take 3 capsules (90 mg total) by mouth daily. 10/18/22 11/17/22  Karie Fetch, MD  gabapentin  (NEURONTIN) 300 MG capsule Take 1 capsule (300 mg total) by mouth 3 (three) times daily. 10/17/22 11/16/22  Karie Fetch, MD  hydrOXYzine (ATARAX) 25 MG tablet Take 1 tablet (25 mg total) by mouth 3 (three) times daily as needed for anxiety. 09/03/21   Nwoko, Tommas Olp, PA  insulin aspart protamine- aspart (NOVOLOG MIX 70/30) (70-30) 100 UNIT/ML injection Inject 0.7 mLs (70 Units total) into the skin 2 (two) times daily with breakfast and lunch. 10/17/22 11/16/22  Karie Fetch, MD  insulin glargine-yfgn (SEMGLEE) 100 UNIT/ML injection Inject 0.2 mLs (20 Units total) into the skin at bedtime. 10/06/22   Ardis Hughs, NP  potassium chloride SA (KLOR-CON M) 20 MEQ tablet Take 20 mEq by mouth daily.    [provider]  prazosin (MINIPRESS) 2 MG capsule Take 1 capsule (2 mg total) by mouth at bedtime. 09/03/21   Nwoko, Tommas Olp, PA  rosuvastatin (CRESTOR) 40 MG tablet Take 1 tablet (40 mg total) by mouth daily. 10/27/22   Evette Georges, MD  sacubitril-valsartan (ENTRESTO) 97-103 MG Take 1 tablet by mouth 2 (two) times daily. 10/06/22   Ardis Hughs, NP  spironolactone (ALDACTONE) 25 MG tablet Take 1 tablet (25 mg total) by mouth daily. 10/07/22   Ardis Hughs, NP  torsemide (DEMADEX) 20 MG tablet Take 3 tablets (60 mg total) by mouth daily. 05/25/22   Laurey Morale, MD  traZODone (DESYREL) 300 MG tablet Take 1  tablet (300 mg total) by mouth at bedtime as needed for sleep. 10/17/22 11/16/22  Karie Fetch, MD      Allergies    Aspirin, Iodine-131, Peanut (diagnostic), Iodinated contrast media, Latex, Penicillins, Ultram [tramadol], Zestril [lisinopril], Cinnamon, Augmentin [amoxicillin-pot clavulanate], and Lidocaine    Review of Systems   Review of Systems  Constitutional:  Negative for fever.  Respiratory:  Negative for shortness of breath.   Cardiovascular:  Negative for chest pain.  Gastrointestinal:  Negative for abdominal pain, diarrhea and vomiting.  Skin:  Negative  for rash.  Neurological:  Negative for headaches.  Psychiatric/Behavioral:  Positive for agitation and sleep disturbance. Negative for hallucinations, self-injury and suicidal ideas.     Physical Exam Updated Vital Signs BP (!) 159/84 (BP Location: Right Arm)   Pulse 98   Temp 98.2 F (36.8 C) (Oral)   Resp 20   Ht 5\' 5"  (1.651 m)   Wt (!) 140 kg   SpO2 96%   BMI 51.36 kg/m  Physical Exam Vitals and nursing note reviewed.  Constitutional:      General: He is not in acute distress.    Appearance: Normal appearance. He is not ill-appearing.  HENT:     Head: Normocephalic.     Mouth/Throat:     Mouth: Mucous membranes are moist.  Cardiovascular:     Rate and Rhythm: Normal rate.  Pulmonary:     Effort: Pulmonary effort is normal. No respiratory distress.  Abdominal:     Palpations: Abdomen is soft.     Tenderness: There is no abdominal tenderness.  Skin:    General: Skin is warm.  Neurological:     Mental Status: He is alert and oriented to person, place, and time. Mental status is at baseline.  Psychiatric:        Mood and Affect: Mood normal.     ED Results / Procedures / Treatments   Labs (all labs ordered are listed, but only abnormal results are displayed) Labs Reviewed  COMPREHENSIVE METABOLIC PANEL - Abnormal; Notable for the following components:      Result Value   Sodium 134 (*)    Glucose, Bld 406 (*)    Calcium 8.6 (*)    Total Protein 5.7 (*)    Albumin 3.0 (*)    All other components within normal limits  CBC WITH DIFFERENTIAL/PLATELET - Abnormal; Notable for the following components:   Hemoglobin 12.9 (*)    All other components within normal limits  SALICYLATE LEVEL - Abnormal; Notable for the following components:   Salicylate Lvl <7.0 (*)    All other components within normal limits  ACETAMINOPHEN LEVEL - Abnormal; Notable for the following components:   Acetaminophen (Tylenol), Serum <10 (*)    All other components within normal limits   CBG MONITORING, ED - Abnormal; Notable for the following components:   Glucose-Capillary 386 (*)    All other components within normal limits  ETHANOL  RAPID URINE DRUG SCREEN, HOSP PERFORMED    EKG EKG Interpretation  Date/Time:  Thursday December 03 2022 14:24:13 EDT Ventricular Rate:  89 PR Interval:  158 QRS Duration: 138 QT Interval:  410 QTC Calculation: 498 R Axis:   89 Text Interpretation: Atrial-sensed ventricular-paced rhythm Abnormal ECG When compared with ECG of 25-Oct-2022 12:48, PREVIOUS ECG IS PRESENT Confirmed by Coralee Pesa [phone]) on 12/03/2022 5:30:13 PM  Radiology No results found.  Procedures Procedures    Medications Ordered in ED Medications  insulin aspart (novoLOG) injection 10 Units (  10 Units Subcutaneous Given 12/03/22 1828)    ED Course/ Medical Decision Making/ A&P                             Medical Decision Making Risk Prescription drug management.   52 year old male presents emergency department for med clearance.  Was noted to be somnolent when being evaluated at [facility] health.  Admitted to taking an extra dose of the medicine that helps him sleep at night.  He states that he did this because he has been on a bender for the past couple days and wants to sleep.  He did not take this extra dose and attempt for self-harm or suicide.  He does not know the exact name of this medication.  However on my evaluation he is awake, conversational, appropriate, oriented.  He has no acute complaints other than wanting psychiatric evaluation.  Med clearance labs look appropriate, besides hyperglycemia with no other electrolyte and neurology.  Patient admits that he did not take his doses of insulin today as he has been with long enforcement at behavioral health.  Reach back out to [facility] behavioral health and they state that they cannot accept him given his blood sugar is over 350.  They recommend blood sugar control and  monitoring for 24 hours to TTS evaluation here.  Home meds ordered, patient documented to be on high doses of insulin which patient confirms.   Insulin has been ordered, repeat CBG will be evaluated but TTS evaluation will be pursued and patient will be signed out to provider Default.        Final Clinical Impression(s) / ED Diagnoses Final diagnoses:  None    Rx / DC Orders ED Discharge Orders     None          Rozelle Logan, DO 12/03/22 1920

## 2022-12-03 NOTE — Discharge Instructions (Addendum)
Transfer to [facility] 

## 2022-12-03 NOTE — ED Triage Notes (Signed)
Pt present to ED from behavior health for medical clearance. Pt states to taking 15mg  of prazosin instead of 9mg  as ordered. Pt drowsy but A&Ox4 at this time. Pt denies pain/discomfort at this time.

## 2022-12-03 NOTE — ED Provider Notes (Addendum)
Behavioral Health Urgent Care Medical Screening Exam  Patient Name: Luis [name] MRN: [id number] Date of Evaluation: 12/03/22 Diagnosis:  Final diagnoses:  Suicidal ideation  Cocaine use disorder (HCC)  Lethargic    History of Present illness: Luis [name] is a 52 y.o. male patient with a past psychiatric history of MDD, GAD and cocaine use and a medical history significant for CHF, pacemaker-biventricular ICD, HLD, and diabetes type 2 who presented to the [facility] behavioral health urgent care voluntary accompanied by law enforcement for psychiatric evaluation.  On exam, patient is lethargic and difficult to assess. He reported taking extra pills today, 1 hour ago because he's been feeling depressed. He is unable to recall the name of the medications he ingested. He reports taking 6 (30 mg) tablets of a medication that is for nightmares, anxiety and pain. When asked if it was Prazosin or Prozac, he is uncertain if it was Prazosin and denies taking Prozac. Patient is too somnolent to thoroughly assess. Patient denies chest pain, N/V, dizziness or SOB. Vital signs B/P 141/68, T-98.5, resp 16 and O2 95%. Patient to be sent to [facility] for medical clearance via EMS. Report called to Dr. Hyacinth Meeker at [facility]. Patient may return to the [facility] once medically cleared.    Flowsheet Row ED from 12/03/2022 in [facility] ED to Hosp-Admission (Discharged) from 10/25/2022 in [facility] ED to Hosp-Admission (Discharged) [facility]  C-SSRS RISK CATEGORY High Risk No Risk Moderate Risk       Psychiatric Specialty Exam  Presentation  General Appearance:Disheveled  Eye Contact:Poor  Speech:Slow  Speech Volume:Decreased  Handedness:Right   Mood and Affect  Mood: Depressed  Affect: Congruent   Thought Process  Thought Processes: -- (UTA)  Descriptions of  Associations:Intact  Orientation:Full (Time, Place and Person)  Thought Content:WDL (UTA)  Diagnosis of Schizophrenia or Schizoaffective disorder in past: No   Hallucinations:None  Ideas of Reference:None  Suicidal Thoughts:Yes, Passive With Intent; Without Plan Without Intent; Without Plan  Homicidal Thoughts:No   Sensorium  Memory: Immediate Fair  Judgment: Poor  Insight: Poor   Executive Functions  Concentration: Poor  Attention Span: Poor  Recall: Poor  Fund of Knowledge: Other (comment) (UTA)  Language: Poor   Psychomotor Activity  Psychomotor Activity: Decreased   Assets  Assets: Desire for Improvement   Sleep  Sleep: Poor  Number of hours: No data recorded  Physical Exam: Physical Exam Cardiovascular:     Rate and Rhythm: Normal rate.  Pulmonary:     Effort: Pulmonary effort is normal.  Musculoskeletal:        General: Normal range of motion.     Cervical back: Normal range of motion.    Review of Systems  Constitutional: Negative.   Respiratory: Negative.    Cardiovascular: Negative.   Neurological: Negative.   Psychiatric/Behavioral:  Positive for depression, substance abuse and suicidal ideas.    Blood pressure (!) 141/68, pulse 90, temperature 98.5 F (36.9 C), temperature source Oral, resp. rate 16, SpO2 95 %. There is no height or weight on file to calculate BMI.  Musculoskeletal: Strength & Muscle Tone: within normal limits Gait & Station: normal Patient leans: N/A   [facility] M[facility] Discharge Disposition for Follow up and Recommendations: Based on my evaluation the patient appears to have an emergency medical condition for which I recommend the patient be transferred to the emergency department for further evaluation.   On exam, patient  is lethargic and difficult to assess. He reported taking extra pills today, 1 hour ago because he's been feeling depressed. He is unable to recall the name of the medications he  ingested. He reports taking 6 (30 mg) tablets of a medication that is for nightmares, anxiety and pain. When asked if it was Prazosin or Prozac, he is uncertain if it was Prazosin and denies taking Prozac. Patient is too somnolent to thoroughly assess. Patient denies chest pain, N/V, dizziness or SOB. Vital signs B/P 141/68, T-98.5, resp 16 and O2 95%. Patient to be sent to [facility] for medical clearance via EMS. Report called to Dr. Hyacinth Meeker at [facility]. Patient may return to the [facility] once medically cleared.   Jyllian Haynie L, NP 12/03/2022, 1:36 PM

## 2022-12-03 NOTE — ED Notes (Signed)
Sandwich and soft drink provided to pt.

## 2022-12-03 NOTE — Progress Notes (Signed)
   12/03/22 1250  [facility] Triage Screening (Walk-ins at [facility] only)  How Did You Hear About [place]? Legal System  What Is the Reason for Your Visit/Call Today? Luis [name] is a 52 y/o male presenting to the [facility] via [employer]. States that his life alert accidentally went off which alert police and the fire department to present to his apartment. States that when they arrived he told the that he was having a "hard time".  Patient presents with increased depressive symptoms connected to many life stressors (feelings of being unloved from his current family members/children, health problems, including frequent falls). He is currently endorsing suicidal ideations. He endorses SI with intent.  Last night he overdosed consuming "Prizor, my depression medications". States that he is prescribed #3 "Prizor" per day but decided to take a total of #6 pills to make himself feel better.  He is unable to contract for safety. No current access to firearms. The patient has 3 prior suicide attempts in the past; one a year ago (overdose), and he was admitted to [facility] for inpatient treatment and ECT. Also, a recent admission to [facility] for similar symptoms. Right now, he cannot affirm his safety. Reports he hasn't eaten in  days and has diabetes. Also, not sleeping well. Patient denies HI and AVH's. States that he has been on a 3 day cocaine binge.He receives outpatient services w/ the [place] in [place]. He is interested in inpatient treatment at [facility]. Lives with girlfriend and has 7 children.  How Long Has This Been Causing You Problems? <Week  Have You Recently Had Any Thoughts About Hurting Yourself? Yes  How long ago did you have thoughts about hurting yourself? x3 days patient endorses suicidal thoughts with intent  Are You Planning to Commit Suicide/Harm Yourself At This time? Yes  Have you Recently Had Thoughts About Hurting Someone Karolee Ohs? No  How long ago did you have thoughts of harming others? n/a  Are You Planning  To Harm Someone At This Time? No  Explanation: n/a  Are you currently experiencing any auditory, visual or other hallucinations? No  Have You Used Any Alcohol or Drugs in the Past 24 Hours? Yes  How long ago did you use Drugs or Alcohol? Cocaine binge x3 days. Also, reports drinking 1 beer.  What Did You Use and How Much? Cocaine binge last 3 days.  Do you have any current medical co-morbidities that require immediate attention? No  Clinician description of patient physical appearance/behavior: Patient presents with a flat affect. He appears visibly distressed and hopeless. He is coopertive, AAOx5.  What Do You Feel Would Help You the Most Today? Treatment for Depression or other mood problem;Medication(s);Stress Management  If access to [facility] was not available, would you have sought care in the Emergency Department? Yes  Determination of Need Urgent (48 hours)  Options For Referral Medication Management;Outpatient Therapy;Inpatient Hospitalization;Partial Hospitalization;Chemical Dependency Intensive Outpatient Therapy (CDIOP)

## 2022-12-04 ENCOUNTER — Other Ambulatory Visit: Payer: Self-pay

## 2022-12-04 ENCOUNTER — Inpatient Hospital Stay (HOSPITAL_COMMUNITY)
Admission: AD | Admit: 2022-12-04 | Discharge: 2022-12-08 | DRG: 885 | Disposition: A | Discharge status: Home / Self Care | Payer: Medicare HMO | Source: Intra-hospital | Attending: Psychiatry | Admitting: Psychiatry

## 2022-12-04 ENCOUNTER — Encounter (HOSPITAL_COMMUNITY): Payer: Self-pay | Admitting: Behavioral Health

## 2022-12-04 DIAGNOSIS — Z6841 Body Mass Index (BMI) 40.0 and over, adult: Secondary | ICD-10-CM | POA: Diagnosis not present

## 2022-12-04 DIAGNOSIS — J9611 Chronic respiratory failure with hypoxia: Secondary | ICD-10-CM | POA: Diagnosis present

## 2022-12-04 DIAGNOSIS — I5042 Chronic combined systolic (congestive) and diastolic (congestive) heart failure: Secondary | ICD-10-CM | POA: Diagnosis present

## 2022-12-04 DIAGNOSIS — G4700 Insomnia, unspecified: Secondary | ICD-10-CM | POA: Diagnosis present

## 2022-12-04 DIAGNOSIS — E785 Hyperlipidemia, unspecified: Secondary | ICD-10-CM | POA: Diagnosis present

## 2022-12-04 DIAGNOSIS — G4730 Sleep apnea, unspecified: Secondary | ICD-10-CM | POA: Diagnosis present

## 2022-12-04 DIAGNOSIS — F22 Delusional disorders: Secondary | ICD-10-CM | POA: Diagnosis present

## 2022-12-04 DIAGNOSIS — F333 Major depressive disorder, recurrent, severe with psychotic symptoms: Secondary | ICD-10-CM | POA: Diagnosis present

## 2022-12-04 DIAGNOSIS — Z9981 Dependence on supplemental oxygen: Secondary | ICD-10-CM | POA: Diagnosis not present

## 2022-12-04 DIAGNOSIS — Z884 Allergy status to anesthetic agent status: Secondary | ICD-10-CM

## 2022-12-04 DIAGNOSIS — Z91199 Patient's noncompliance with other medical treatment and regimen due to unspecified reason: Secondary | ICD-10-CM

## 2022-12-04 DIAGNOSIS — G8929 Other chronic pain: Secondary | ICD-10-CM | POA: Diagnosis present

## 2022-12-04 DIAGNOSIS — Z5986 Financial insecurity: Secondary | ICD-10-CM

## 2022-12-04 DIAGNOSIS — I252 Old myocardial infarction: Secondary | ICD-10-CM

## 2022-12-04 DIAGNOSIS — E66813 Obesity, class 3: Secondary | ICD-10-CM | POA: Diagnosis present

## 2022-12-04 DIAGNOSIS — F411 Generalized anxiety disorder: Secondary | ICD-10-CM | POA: Diagnosis present

## 2022-12-04 DIAGNOSIS — Z8249 Family history of ischemic heart disease and other diseases of the circulatory system: Secondary | ICD-10-CM

## 2022-12-04 DIAGNOSIS — Z5982 Transportation insecurity: Secondary | ICD-10-CM

## 2022-12-04 DIAGNOSIS — I428 Other cardiomyopathies: Secondary | ICD-10-CM | POA: Diagnosis present

## 2022-12-04 DIAGNOSIS — Z91411 Personal history of adult psychological abuse: Secondary | ICD-10-CM

## 2022-12-04 DIAGNOSIS — K219 Gastro-esophageal reflux disease without esophagitis: Secondary | ICD-10-CM | POA: Diagnosis present

## 2022-12-04 DIAGNOSIS — R45851 Suicidal ideations: Secondary | ICD-10-CM | POA: Diagnosis present

## 2022-12-04 DIAGNOSIS — Z794 Long term (current) use of insulin: Secondary | ICD-10-CM | POA: Diagnosis not present

## 2022-12-04 DIAGNOSIS — F322 Major depressive disorder, single episode, severe without psychotic features: Secondary | ICD-10-CM

## 2022-12-04 DIAGNOSIS — E1169 Type 2 diabetes mellitus with other specified complication: Secondary | ICD-10-CM

## 2022-12-04 DIAGNOSIS — Z638 Other specified problems related to primary support group: Secondary | ICD-10-CM

## 2022-12-04 DIAGNOSIS — Z62890 Parent-child estrangement NEC: Secondary | ICD-10-CM

## 2022-12-04 DIAGNOSIS — Z95810 Presence of automatic (implantable) cardiac defibrillator: Secondary | ICD-10-CM | POA: Diagnosis present

## 2022-12-04 DIAGNOSIS — Z91041 Radiographic dye allergy status: Secondary | ICD-10-CM

## 2022-12-04 DIAGNOSIS — I10 Essential (primary) hypertension: Secondary | ICD-10-CM | POA: Diagnosis present

## 2022-12-04 DIAGNOSIS — I110 Hypertensive heart disease with heart failure: Secondary | ICD-10-CM | POA: Diagnosis present

## 2022-12-04 DIAGNOSIS — Z8673 Personal history of transient ischemic attack (TIA), and cerebral infarction without residual deficits: Secondary | ICD-10-CM | POA: Diagnosis not present

## 2022-12-04 DIAGNOSIS — F332 Major depressive disorder, recurrent severe without psychotic features: Secondary | ICD-10-CM | POA: Diagnosis not present

## 2022-12-04 DIAGNOSIS — Z885 Allergy status to narcotic agent status: Secondary | ICD-10-CM

## 2022-12-04 DIAGNOSIS — Z5941 Food insecurity: Secondary | ICD-10-CM

## 2022-12-04 DIAGNOSIS — E119 Type 2 diabetes mellitus without complications: Secondary | ICD-10-CM

## 2022-12-04 DIAGNOSIS — Z9151 Personal history of suicidal behavior: Secondary | ICD-10-CM

## 2022-12-04 DIAGNOSIS — R079 Chest pain, unspecified: Secondary | ICD-10-CM | POA: Diagnosis not present

## 2022-12-04 DIAGNOSIS — Z950 Presence of cardiac pacemaker: Secondary | ICD-10-CM | POA: Diagnosis present

## 2022-12-04 DIAGNOSIS — Z4502 Encounter for adjustment and management of automatic implantable cardiac defibrillator: Secondary | ICD-10-CM

## 2022-12-04 DIAGNOSIS — I5043 Acute on chronic combined systolic (congestive) and diastolic (congestive) heart failure: Secondary | ICD-10-CM | POA: Diagnosis present

## 2022-12-04 DIAGNOSIS — Z79899 Other long term (current) drug therapy: Secondary | ICD-10-CM | POA: Diagnosis not present

## 2022-12-04 DIAGNOSIS — F17210 Nicotine dependence, cigarettes, uncomplicated: Secondary | ICD-10-CM | POA: Diagnosis present

## 2022-12-04 DIAGNOSIS — G4733 Obstructive sleep apnea (adult) (pediatric): Secondary | ICD-10-CM | POA: Diagnosis present

## 2022-12-04 DIAGNOSIS — F1420 Cocaine dependence, uncomplicated: Secondary | ICD-10-CM | POA: Diagnosis present

## 2022-12-04 DIAGNOSIS — I447 Left bundle-branch block, unspecified: Secondary | ICD-10-CM | POA: Diagnosis present

## 2022-12-04 DIAGNOSIS — Z886 Allergy status to analgesic agent status: Secondary | ICD-10-CM

## 2022-12-04 DIAGNOSIS — Z9185 Personal history of military service: Secondary | ICD-10-CM

## 2022-12-04 DIAGNOSIS — Z801 Family history of malignant neoplasm of trachea, bronchus and lung: Secondary | ICD-10-CM

## 2022-12-04 DIAGNOSIS — Z7902 Long term (current) use of antithrombotics/antiplatelets: Secondary | ICD-10-CM

## 2022-12-04 DIAGNOSIS — Z91040 Latex allergy status: Secondary | ICD-10-CM

## 2022-12-04 DIAGNOSIS — F4310 Post-traumatic stress disorder, unspecified: Secondary | ICD-10-CM | POA: Diagnosis present

## 2022-12-04 DIAGNOSIS — F1490 Cocaine use, unspecified, uncomplicated: Secondary | ICD-10-CM | POA: Insufficient documentation

## 2022-12-04 LAB — CBG MONITORING, ED
Glucose-Capillary: 186 mg/dL — ABNORMAL HIGH (ref 70–99)
Glucose-Capillary: 86 mg/dL (ref 70–99)

## 2022-12-04 LAB — GLUCOSE, CAPILLARY: Glucose-Capillary: 229 mg/dL — ABNORMAL HIGH (ref 70–99)

## 2022-12-04 MED ORDER — ARIPIPRAZOLE 5 MG PO TABS
5.0000 mg | ORAL_TABLET | Freq: Every day | ORAL | Status: DC
  Administered 2022-12-05 – 2022-12-08 (×4): 5 mg via ORAL
  Filled 2022-12-04 (×6): qty 1

## 2022-12-04 MED ORDER — INSULIN ASPART 100 UNIT/ML IJ SOLN
0.0000 [IU] | Freq: Every day | INTRAMUSCULAR | Status: DC
  Administered 2022-12-04: 2 [IU] via SUBCUTANEOUS
  Administered 2022-12-05 – 2022-12-06 (×2): 4 [IU] via SUBCUTANEOUS
  Administered 2022-12-07: 5 [IU] via SUBCUTANEOUS

## 2022-12-04 MED ORDER — ALUM & MAG HYDROXIDE-SIMETH 200-200-20 MG/5ML PO SUSP: 30.0000 mL | ORAL | Status: DC | PRN

## 2022-12-04 MED ORDER — DIPHENHYDRAMINE HCL 25 MG PO CAPS
50.0000 mg | ORAL_CAPSULE | Freq: Three times a day (TID) | ORAL | Status: DC | PRN
  Administered 2022-12-08: 50 mg via ORAL
  Filled 2022-12-04: qty 2

## 2022-12-04 MED ORDER — LORAZEPAM 2 MG/ML IJ SOLN: 2.0000 mg | Freq: Three times a day (TID) | INTRAMUSCULAR | Status: DC | PRN

## 2022-12-04 MED ORDER — GABAPENTIN 300 MG PO CAPS
300.0000 mg | ORAL_CAPSULE | Freq: Three times a day (TID) | ORAL | Status: DC
  Administered 2022-12-05 – 2022-12-07 (×8): 300 mg via ORAL
  Filled 2022-12-04 (×12): qty 1

## 2022-12-04 MED ORDER — INSULIN GLARGINE-YFGN 100 UNIT/ML ~~LOC~~ SOLN
20.0000 [IU] | Freq: Every day | SUBCUTANEOUS | Status: DC
  Administered 2022-12-04 – 2022-12-07 (×4): 20 [IU] via SUBCUTANEOUS
  Filled 2022-12-04 (×3): qty 0.2

## 2022-12-04 MED ORDER — ROSUVASTATIN CALCIUM 20 MG PO TABS
40.0000 mg | ORAL_TABLET | Freq: Every day | ORAL | Status: DC
  Administered 2022-12-05 – 2022-12-08 (×4): 40 mg via ORAL
  Filled 2022-12-04: qty 2
  Filled 2022-12-04: qty 8
  Filled 2022-12-04 (×2): qty 1
  Filled 2022-12-04 (×3): qty 2

## 2022-12-04 MED ORDER — DAPAGLIFLOZIN PROPANEDIOL 10 MG PO TABS
10.0000 mg | ORAL_TABLET | Freq: Every day | ORAL | Status: DC
  Administered 2022-12-05 – 2022-12-08 (×4): 10 mg via ORAL
  Filled 2022-12-04 (×6): qty 1

## 2022-12-04 MED ORDER — INSULIN ASPART PROT & ASPART (70-30 MIX) 100 UNIT/ML ~~LOC~~ SUSP: 70.0000 [IU] | Freq: Two times a day (BID) | SUBCUTANEOUS | Status: DC

## 2022-12-04 MED ORDER — TORSEMIDE 20 MG PO TABS
60.0000 mg | ORAL_TABLET | Freq: Every day | ORAL | Status: DC
  Administered 2022-12-05 – 2022-12-08 (×4): 60 mg via ORAL
  Filled 2022-12-04 (×6): qty 3

## 2022-12-04 MED ORDER — HALOPERIDOL LACTATE 5 MG/ML IJ SOLN: 5.0000 mg | Freq: Three times a day (TID) | INTRAMUSCULAR | Status: DC | PRN

## 2022-12-04 MED ORDER — INSULIN ASPART 100 UNIT/ML IJ SOLN
0.0000 [IU] | Freq: Three times a day (TID) | INTRAMUSCULAR | Status: DC
  Administered 2022-12-05 – 2022-12-06 (×3): 8 [IU] via SUBCUTANEOUS
  Administered 2022-12-06 – 2022-12-07 (×3): 11 [IU] via SUBCUTANEOUS
  Administered 2022-12-07: 15 [IU] via SUBCUTANEOUS
  Administered 2022-12-08: 8 [IU] via SUBCUTANEOUS

## 2022-12-04 MED ORDER — ACETAMINOPHEN 325 MG PO TABS
650.0000 mg | ORAL_TABLET | Freq: Four times a day (QID) | ORAL | Status: DC | PRN
  Administered 2022-12-07: 650 mg via ORAL
  Filled 2022-12-04 (×2): qty 2

## 2022-12-04 MED ORDER — HALOPERIDOL 5 MG PO TABS: 5.0000 mg | ORAL_TABLET | Freq: Three times a day (TID) | ORAL | Status: DC | PRN

## 2022-12-04 MED ORDER — HYDROXYZINE HCL 25 MG PO TABS
25.0000 mg | ORAL_TABLET | Freq: Three times a day (TID) | ORAL | Status: DC | PRN
  Administered 2022-12-06 – 2022-12-07 (×2): 25 mg via ORAL
  Filled 2022-12-04 (×2): qty 1

## 2022-12-04 MED ORDER — CLOPIDOGREL BISULFATE 75 MG PO TABS
75.0000 mg | ORAL_TABLET | Freq: Every day | ORAL | Status: DC
  Administered 2022-12-07 – 2022-12-08 (×2): 75 mg via ORAL
  Filled 2022-12-04 (×6): qty 1

## 2022-12-04 MED ORDER — TRAZODONE HCL 50 MG PO TABS
25.0000 mg | ORAL_TABLET | Freq: Every evening | ORAL | Status: DC | PRN
  Administered 2022-12-05: 25 mg via ORAL
  Filled 2022-12-04: qty 1

## 2022-12-04 MED ORDER — CARVEDILOL 3.125 MG PO TABS
3.1250 mg | ORAL_TABLET | Freq: Two times a day (BID) | ORAL | Status: DC
  Administered 2022-12-05 – 2022-12-08 (×7): 3.125 mg via ORAL
  Filled 2022-12-04 (×10): qty 1

## 2022-12-04 MED ORDER — PRAZOSIN HCL 2 MG PO CAPS
2.0000 mg | ORAL_CAPSULE | Freq: Every day | ORAL | Status: DC
  Administered 2022-12-04 – 2022-12-07 (×4): 2 mg via ORAL
  Filled 2022-12-04: qty 1
  Filled 2022-12-04: qty 2
  Filled 2022-12-04 (×5): qty 1

## 2022-12-04 MED ORDER — DIGOXIN 125 MCG PO TABS
0.1250 mg | ORAL_TABLET | Freq: Every day | ORAL | Status: DC
  Administered 2022-12-05 – 2022-12-08 (×4): 0.125 mg via ORAL
  Filled 2022-12-04 (×6): qty 1

## 2022-12-04 MED ORDER — DIPHENHYDRAMINE HCL 50 MG/ML IJ SOLN: 50.0000 mg | Freq: Three times a day (TID) | INTRAMUSCULAR | Status: DC | PRN

## 2022-12-04 MED ORDER — POTASSIUM CHLORIDE CRYS ER 20 MEQ PO TBCR
20.0000 meq | EXTENDED_RELEASE_TABLET | Freq: Every day | ORAL | Status: DC
  Administered 2022-12-05 – 2022-12-08 (×4): 20 meq via ORAL
  Filled 2022-12-04 (×6): qty 1

## 2022-12-04 MED ORDER — MAGNESIUM HYDROXIDE 400 MG/5ML PO SUSP: 30.0000 mL | Freq: Every day | ORAL | Status: DC | PRN

## 2022-12-04 MED ORDER — LORAZEPAM 1 MG PO TABS
2.0000 mg | ORAL_TABLET | Freq: Three times a day (TID) | ORAL | Status: DC | PRN
  Administered 2022-12-08: 2 mg via ORAL
  Filled 2022-12-04: qty 2

## 2022-12-04 NOTE — Progress Notes (Signed)
Pt did not attend group. 

## 2022-12-04 NOTE — Progress Notes (Signed)
While rounding in ED . Visited with patient and provided listening and emotional support.  Available as needed.  Venida Jarvis, [place], BC C, Pager [phone]

## 2022-12-04 NOTE — Consult Note (Cosign Needed Addendum)
Telepsych Consultation   Reason for Consult:  Psych consult  Referring Physician:  Dr. Wilkie Aye Location of Patient: [facility] Location of Provider: Other: [facility]  Patient Identification: Luis [name] MRN:  [id number] Principal Diagnosis: MDD (major depressive disorder), severe (HCC) Diagnosis:  Principal Problem:   MDD (major depressive disorder), severe (HCC) Active Problems:   Cocaine use   Total Time spent with patient: 30 minutes  Subjective:   Luis [name] is a 52 y.o. male patient admitted to [facility] on 12/03/22 for medical clearance due to concerns for possible OD, due to ingestion of extra home medications.   HPI:  Luis [name] is a 52 year old male patient with a past psychiatric history significant for MDD, GAD and cocaine use and a medical history significant for CHF, pacemaker-biventricular ICD, HLD, and diabetes type 2 who initially presented to the [facility] behavioral health urgent care on 6/624 via [facility] reporting worsening depression, cocaine use and ingestion of extra pills. On exam at the [facility], he presented lethargic and there was concerns for possible overdose via ingestion of home medications. Patient was sent to the [facility] emergency department for medical clearance. Today, patient was reevaluated by this provider. Patient denies that taking the extra medications was a suicide attempt. He states that he took the extra pills (unknown) to treat his depression.  He reports worsening depression and describes his depressive symptoms as feelings of sadness, hopelessness, isolating, worthlessness, poor sleep, and poor appetite. He reports binge using cocaine for the past 8 days, on average an 8 th ball to 3.5 grams.  He states that he last used cocaine on Thursday. He reports using cocaine intermittently since he was in college. Patient's urine drug screen positive for cocaine on arrival. He denies alcohol use. He identifies current stressors as, life is  overwhelming, because he is unable to pay his utilities, phone was cut off, Internet and cable was cut off because his cash app account was hacked and he does not have access to his money. He states that he also does not feel loved by his family or significant other. He denies active suicidal ideations but states that he does not feel safe returning home due to his worsening depression and cocaine use. He denies homicidal ideations.  He denies auditory or visual hallucinations. There is no objective evidence that the patient is currently responding to internal or external stimuli. He reports outpatient psychiatry with the [place] in [place]. He is unable to recall his current medication regimen but states that the [place] has not changed his medications since he discharged from the [facility] in May-2024. Per chart review patient was prescribed Abilify 5 mg p.o. daily, Cymbalta 90 mg p.o. daily, gabapentin 300 mg p.o. 3 times daily, Vistaril 25 mg p.o. 3 times daily as needed, prazosin 2 mg p.o. nightly, and trazodone 300 mg p.o. nightly as needed. He denies outpatient therapy at this time. He states that he lives alone in an apartment. He denies access to firearms.    Past Psychiatric History: a past psychiatric history of MDD, GAD and cocaine use. Patient hospitalized at [facility] May, 2024 and [facility] January, 2023. Patient previously received ECT at [facility] in 2023.  Risk to Self:  Patient denies active SI.  Risk to Others:  No Prior Inpatient Therapy:  Yes Prior Outpatient Therapy:  Yes   Past Medical History:  Past Medical History:  Diagnosis Date   Anxiety    Biventricular ICD (implantable cardioverter-defibrillator)  in place    Chronic systolic CHF (congestive heart failure) (HCC)    Cocaine use    Diabetes mellitus without complication (HCC)    GERD (gastroesophageal reflux disease)    HLD (hyperlipidemia)    Hypertension    Morbid obesity (HCC)    NICM  (nonischemic cardiomyopathy) (HCC)    OSA (obstructive sleep apnea)    PTSD (post-traumatic stress disorder)    on Depakote   Refusal of blood transfusions as patient is Jehovah's Witness    Tobacco abuse     Past Surgical History:  Procedure Laterality Date   BIV ICD INSERTION CRT-D     FOOT FRACTURE SURGERY     ICD GENERATOR CHANGEOUT N/A 03/23/2022   Procedure: ICD GENERATOR CHANGEOUT;  Surgeon: Nelly Laurence, Roberts Gaudy, MD;  Location: [facility] INVASIVE CV LAB;  Service: Cardiovascular;  Laterality: N/A;   RIGHT HEART CATH N/A 03/06/2022   Procedure: RIGHT HEART CATH;  Surgeon: Orbie Pyo, MD;  Location: [facility] INVASIVE CV LAB;  Service: Cardiovascular;  Laterality: N/A;   RIGHT/LEFT HEART CATH AND CORONARY ANGIOGRAPHY N/A 04/16/2022   Procedure: RIGHT/LEFT HEART CATH AND CORONARY ANGIOGRAPHY;  Surgeon: Laurey Morale, MD;  Location: [facility] INVASIVE CV LAB;  Service: Cardiovascular;  Laterality: N/A;   ROTATOR CUFF REPAIR     TONSILLECTOMY     Family History:  Family History  Problem Relation Age of Onset   Hypertension Mother    Bone cancer Father    Lung cancer Father    Family Psychiatric  History: Father possibly has a history of PTSD, he served three tours in [place].  Social History:  Social History   Substance and Sexual Activity  Alcohol Use Not Currently     Social History   Substance and Sexual Activity  Drug Use Yes   Types: Cocaine, Marijuana   Comment: using in binges, 1/4gm daily x 2 weeks.    Social History   Socioeconomic History   Marital status: Widowed    Spouse name: Not on file   Number of children: 7   Years of education: Not on file   Highest education level: Bachelor's degree (e.g., BA, AB, BS)  Occupational History   Occupation: disability  Tobacco Use   Smoking status: Every Day    Packs/day: 0.50    Years: 15.00    Additional pack years: 0.00    Total pack years: 7.50    Types: Cigarettes    Last attempt to quit: 10/2021    Years since quitting:  1.1   Smokeless tobacco: Never   Tobacco comments:    occasionally  Vaping Use   Vaping Use: Never used  Substance and Sexual Activity   Alcohol use: Not Currently   Drug use: Yes    Types: Cocaine, Marijuana    Comment: using in binges, 1/4gm daily x 2 weeks.   Sexual activity: Not Currently  Other Topics Concern   Not on file  Social History Narrative   Not on file   Social Determinants of Health   Financial Resource Strain: High Risk (11/16/2022)   Overall Financial Resource Strain (CARDIA)    Difficulty of Paying Living Expenses: Hard  Food Insecurity: Food Insecurity Present (11/16/2022)   Hunger Vital Sign    Worried About Running Out of Food in the Last Year: Sometimes true    Ran Out of Food in the Last Year: Sometimes true  Transportation Needs: Unmet Transportation Needs (11/16/2022)   PRAPARE - Transportation    Lack of  Transportation (Medical): No    Lack of Transportation (Non-Medical): Yes  Physical Activity: Not on file  Stress: Not on file  Social Connections: Not on file   Additional Social History:    Allergies:   Allergies  Allergen Reactions   Aspirin Anaphylaxis and Other (See Comments)    Throat closing    Iodine-131 Hives   Peanut (Diagnostic) Anaphylaxis    Throat closes   Iodinated Contrast Media Hives    Can be premedicated    Latex Hives and Rash   Penicillins Nausea And Vomiting   Ultram [Tramadol] Hives and Nausea Only   Zestril [Lisinopril] Cough   Cinnamon    Amoxicillin-Pot Clavulanate Diarrhea   Lidocaine Rash    Rash from adhesive on lidocaine patches     Labs:  Results for orders placed or performed during the hospital encounter of 12/03/22 (from the past 48 hour(s))  CBG monitoring, ED     Status: Abnormal   Collection Time: 12/03/22  2:28 PM  Result Value Ref Range   Glucose-Capillary 386 (H) 70 - 99 mg/dL    Comment: Glucose reference range applies only to samples taken after fasting for at least 8 hours.   Comprehensive metabolic panel     Status: Abnormal   Collection Time: 12/03/22  2:40 PM  Result Value Ref Range   Sodium 134 (L) 135 - 145 mmol/L   Potassium 4.0 3.5 - 5.1 mmol/L   Chloride 102 98 - 111 mmol/L   CO2 22 22 - 32 mmol/L   Glucose, Bld 406 (H) 70 - 99 mg/dL    Comment: Glucose reference range applies only to samples taken after fasting for at least 8 hours.   BUN 8 6 - 20 mg/dL   Creatinine, Ser 0.98 0.61 - 1.24 mg/dL   Calcium 8.6 (L) 8.9 - 10.3 mg/dL   Total Protein 5.7 (L) 6.5 - 8.1 g/dL   Albumin 3.0 (L) 3.5 - 5.0 g/dL   AST 22 15 - 41 U/L   ALT 18 0 - 44 U/L   Alkaline Phosphatase 51 38 - 126 U/L   Total Bilirubin 0.3 0.3 - 1.2 mg/dL   GFR, Estimated >11 >91 mL/min    Comment: (NOTE) Calculated using the CKD-EPI Creatinine Equation (2021)    Anion gap 10 5 - 15    Comment: Performed at [facility], [address], [place], [place] [address]  Ethanol     Status: None   Collection Time: 12/03/22  2:40 PM  Result Value Ref Range   Alcohol, Ethyl (B) <10 <10 mg/dL    Comment: (NOTE) Lowest detectable limit for serum alcohol is 10 mg/dL.  For medical purposes only. Performed at [facility], [address], [place], [place] [address]   CBC with Diff     Status: Abnormal   Collection Time: 12/03/22  2:40 PM  Result Value Ref Range   WBC 8.5 4.0 - 10.5 K/uL   RBC 4.49 4.22 - 5.81 MIL/uL   Hemoglobin 12.9 (L) 13.0 - 17.0 g/dL   HCT 08.6 57.8 - 46.9 %   MCV 89.3 80.0 - 100.0 fL   MCH 28.7 26.0 - 34.0 pg   MCHC 32.2 30.0 - 36.0 g/dL   RDW 62.9 52.8 - 41.3 %   Platelets 221 150 - 400 K/uL   nRBC 0.0 0.0 - 0.2 %   Neutrophils Relative % 63 %   Neutro Abs 5.4 1.7 - 7.7 K/uL   Lymphocytes  Relative 28 %   Lymphs Abs 2.4 0.7 - 4.0 K/uL   Monocytes Relative 8 %   Monocytes Absolute 0.7 0.1 - 1.0 K/uL   Eosinophils Relative 1 %   Eosinophils Absolute 0.1 0.0 - 0.5 K/uL   Basophils Relative 0 %   Basophils Absolute 0.0 0.0 - 0.1 K/uL   Immature  Granulocytes 0 %   Abs Immature Granulocytes 0.02 0.00 - 0.07 K/uL    Comment: Performed at [facility], [address] [place], [place] [address]  Salicylate level     Status: Abnormal   Collection Time: 12/03/22  2:40 PM  Result Value Ref Range   Salicylate Lvl <7.0 (L) 7.0 - 30.0 mg/dL    Comment: Performed at [facility], [address], [place], [place] [address]  Acetaminophen level     Status: Abnormal   Collection Time: 12/03/22  2:40 PM  Result Value Ref Range   Acetaminophen (Tylenol), Serum <10 (L) 10 - 30 ug/mL    Comment: (NOTE) Therapeutic concentrations vary significantly. A range of 10-30 ug/mL  may be an effective concentration for many patients. However, some  are best treated at concentrations outside of this range. Acetaminophen concentrations >150 ug/mL at 4 hours after ingestion  and >50 ug/mL at 12 hours after ingestion are often associated with  toxic reactions.  Performed at [facility], [address], [place], [place] [address]   Urine rapid drug screen (hosp performed)     Status: Abnormal   Collection Time: 12/03/22  6:00 PM  Result Value Ref Range   Opiates NONE DETECTED NONE DETECTED   Cocaine POSITIVE (A) NONE DETECTED   Benzodiazepines NONE DETECTED NONE DETECTED   Amphetamines NONE DETECTED NONE DETECTED   Tetrahydrocannabinol NONE DETECTED NONE DETECTED   Barbiturates NONE DETECTED NONE DETECTED    Comment: (NOTE) DRUG SCREEN FOR MEDICAL PURPOSES ONLY.  IF CONFIRMATION IS NEEDED FOR ANY PURPOSE, NOTIFY LAB WITHIN 5 DAYS.  LOWEST DETECTABLE LIMITS FOR URINE DRUG SCREEN Drug Class                     Cutoff (ng/mL) Amphetamine and metabolites    1000 Barbiturate and metabolites    200 Benzodiazepine                 200 Opiates and metabolites        300 Cocaine and metabolites        300 THC                            50 Performed at [facility], [address], [place], [place] [address]   POC CBG, ED      Status: Abnormal   Collection Time: 12/03/22  7:04 PM  Result Value Ref Range   Glucose-Capillary 308 (H) 70 - 99 mg/dL    Comment: Glucose reference range applies only to samples taken after fasting for at least 8 hours.   Comment 1 Document in Chart   CBG monitoring, ED     Status: Abnormal   Collection Time: 12/03/22  9:25 PM  Result Value Ref Range   Glucose-Capillary 219 (H) 70 - 99 mg/dL    Comment: Glucose reference range applies only to samples taken after fasting for at least 8 hours.  CBG monitoring, ED     Status: Abnormal   Collection Time: 12/03/22 10:37 PM  Result Value  Ref Range   Glucose-Capillary 206 (H) 70 - 99 mg/dL    Comment: Glucose reference range applies only to samples taken after fasting for at least 8 hours.  CBG monitoring, ED     Status: Abnormal   Collection Time: 12/04/22  9:58 AM  Result Value Ref Range   Glucose-Capillary 186 (H) 70 - 99 mg/dL    Comment: Glucose reference range applies only to samples taken after fasting for at least 8 hours.    Medications:  Current Facility-Administered Medications  Medication Dose Route Frequency Provider Last Rate Last Admin   carvedilol (COREG) tablet 3.125 mg  3.125 mg Oral BID WC Horton, Kristie M, DO   3.125 mg at 12/04/22 1030   clopidogrel (PLAVIX) tablet 75 mg  75 mg Oral Daily Horton, Kristie M, DO   75 mg at 12/03/22 2126   dapagliflozin propanediol (FARXIGA) tablet 10 mg  10 mg Oral Daily Horton, Kristie M, DO   10 mg at 12/04/22 1033   digoxin (LANOXIN) tablet 0.125 mg  0.125 mg Oral Daily Horton, Kristie M, DO   0.125 mg at 12/04/22 1032   hydrOXYzine (ATARAX) tablet 25 mg  25 mg Oral TID PRN Horton, Kristie M, DO       insulin aspart protamine- aspart (NOVOLOG MIX 70/30) injection 70 Units  70 Units Subcutaneous BID WC Horton, Kristie M, DO   70 Units at 12/04/22 1034   insulin glargine-yfgn (SEMGLEE) injection 20 Units  20 Units Subcutaneous QHS Horton, Kristie M, DO   20 Units at 12/03/22 2342    potassium chloride SA (KLOR-CON M) CR tablet 20 mEq  20 mEq Oral Daily Horton, Kristie M, DO   20 mEq at 12/04/22 1030   prazosin (MINIPRESS) capsule 2 mg  2 mg Oral QHS Horton, Kristie M, DO   2 mg at 12/03/22 2344   rosuvastatin (CRESTOR) tablet 40 mg  40 mg Oral Daily Horton, Kristie M, DO   40 mg at 12/04/22 1032   torsemide (DEMADEX) tablet 60 mg  60 mg Oral Daily Horton, Kristie M, DO   60 mg at 12/03/22 2125   Current Outpatient Medications  Medication Sig Dispense Refill   acetaminophen (TYLENOL) 325 MG tablet Take 2 tablets (650 mg total) by mouth every 6 (six) hours as needed for mild pain.     ARIPiprazole (ABILIFY) 5 MG tablet Take 1 tablet (5 mg total) by mouth daily. 30 tablet 0   carvedilol (COREG) 3.125 MG tablet Take 3.125 mg by mouth 2 (two) times daily with a meal.     clopidogrel (PLAVIX) 75 MG tablet Take 1 tablet (75 mg total) by mouth daily. 30 tablet 0   dapagliflozin propanediol (FARXIGA) 10 MG TABS tablet Take 1 tablet (10 mg total) by mouth daily. 90 tablet 2   digoxin (LANOXIN) 0.125 MG tablet Take 0.125 mg by mouth daily.     DULoxetine (CYMBALTA) 30 MG capsule Take 3 capsules (90 mg total) by mouth daily. 90 capsule 0   gabapentin (NEURONTIN) 300 MG capsule Take 1 capsule (300 mg total) by mouth 3 (three) times daily. 90 capsule 0   hydrOXYzine (ATARAX) 25 MG tablet Take 1 tablet (25 mg total) by mouth 3 (three) times daily as needed for anxiety. 75 tablet 1   insulin aspart protamine- aspart (NOVOLOG MIX 70/30) (70-30) 100 UNIT/ML injection Inject 0.7 mLs (70 Units total) into the skin 2 (two) times daily with breakfast and lunch. 42 mL 0   insulin glargine-yfgn (SEMGLEE)  100 UNIT/ML injection Inject 0.2 mLs (20 Units total) into the skin at bedtime. 10 mL 11   potassium chloride SA (KLOR-CON M) 20 MEQ tablet Take 20 mEq by mouth daily.     prazosin (MINIPRESS) 2 MG capsule Take 1 capsule (2 mg total) by mouth at bedtime. 30 capsule 1   rosuvastatin (CRESTOR) 40 MG  tablet Take 1 tablet (40 mg total) by mouth daily. 30 tablet 0   sacubitril-valsartan (ENTRESTO) 97-103 MG Take 1 tablet by mouth 2 (two) times daily. 60 tablet    spironolactone (ALDACTONE) 25 MG tablet Take 1 tablet (25 mg total) by mouth daily.     torsemide (DEMADEX) 20 MG tablet Take 3 tablets (60 mg total) by mouth daily. 270 tablet 2   traZODone (DESYREL) 300 MG tablet Take 1 tablet (300 mg total) by mouth at bedtime as needed for sleep. 30 tablet 0    Musculoskeletal: Strength & Muscle Tone: within normal limits Gait & Station: normal Patient leans: N/A   Psychiatric Specialty Exam:  Presentation  General Appearance:  Casual  Eye Contact: Fair  Speech: Clear and Coherent  Speech Volume: Normal  Handedness: Right   Mood and Affect  Mood: Depressed  Affect: Congruent   Thought Process  Thought Processes: Coherent  Descriptions of Associations:Intact  Orientation:Full (Time, Place and Person)  Thought Content:WNL  History of Schizophrenia/Schizoaffective disorder:No  Duration of Psychotic Symptoms:N/A  Hallucinations:Hallucinations: None  Ideas of Reference:None  Suicidal Thoughts:No.  Homicidal Thoughts:Homicidal Thoughts: No   Sensorium  Memory: Immediate Good; Remote Fair; Recent Fair  Judgment: Intact  Insight: Present   Executive Functions  Concentration: Fair  Attention Span: Fair  Recall: [employer] of Knowledge: Fair  Language: Fair   Psychomotor Activity  Psychomotor Activity: Psychomotor Activity: Normal   Assets  Assets: Communication Skills; Desire for Improvement; Housing; Leisure Time   Sleep  Sleep: Sleep: Poor    Physical Exam: Physical Exam Cardiovascular:     Rate and Rhythm: Normal rate.  Pulmonary:     Effort: Pulmonary effort is normal.  Musculoskeletal:        General: Normal range of motion.  Neurological:     Mental Status: He is alert and oriented to person, place, and  time.    Review of Systems  Constitutional: Negative.   HENT: Negative.    Eyes: Negative.   Respiratory: Negative.    Cardiovascular: Negative.   Gastrointestinal: Negative.   Genitourinary: Negative.   Musculoskeletal: Negative.   Neurological: Negative.   Endo/Heme/Allergies: Negative.   Psychiatric/Behavioral:  Positive for depression and substance abuse.    Blood pressure (!) 147/89, pulse 97, temperature 98.3 F (36.8 C), temperature source Oral, resp. rate 18, height 5\' 5"  (1.651 m), weight (!) 140 kg, SpO2 98 %. Body mass index is 51.36 kg/m.  Treatment Plan Summary: Patient accepted to [facility] for inpatient psychiatric treatment today. Patient is voluntary.  Disposition: Recommend psychiatric Inpatient admission when medically cleared.  This service was provided via telemedicine using a 2-way, interactive audio and video technology.  Names of all persons participating in this telemedicine service and their role in this encounter. Name: Jolayne Haines Role: Patient   Name: Liborio Nixon,  Role: NP  Name:  Role:   Name:  Role:    Layla Barter, NP 12/04/2022 11:54 AM

## 2022-12-04 NOTE — ED Notes (Signed)
Safe transport ETA 30 minute

## 2022-12-04 NOTE — ED Notes (Signed)
Patient left in stable condition with his belongings, AOX4, and transported  by [employer].

## 2022-12-04 NOTE — Progress Notes (Signed)
Pt was accepted to [facility] TODAY 12/04/2022. Bed assignment: 401-1  Pt meets inpatient criteria per Liborio Nixon, NP  Attending Physician will be Phineas Inches, MD  Report can be called to: - Adult unit: [phone]  Pt can arrive after 4 PM  Care Team Notified: [facility] [place], RN, Bedelia Person, RN, Liborio Nixon, NP, Ophelia Shoulder, NP, Delorise Royals, RN, and Zenovia Jordan, NT  [place], [place]  12/04/2022 1:21 PM

## 2022-12-04 NOTE — Inpatient Diabetes Management (Signed)
Inpatient Diabetes Program Recommendations  AACE/ADA: New Consensus Statement on Inpatient Glycemic Control (2015)  Target Ranges:  Prepandial:   less than 140 mg/dL      Peak postprandial:   less than 180 mg/dL (1-2 hours)      Critically ill patients:  140 - 180 mg/dL   Lab Results  Component Value Date   GLUCAP 186 (H) 12/04/2022   HGBA1C 9.2 (H) 10/05/2022    Review of Glycemic Control  Diabetes history: DM2 Outpatient Diabetes medications: 70/30 70 units BID Current orders for Inpatient glycemic control: 70/30 70 units BID, Semglee 20 units QD, Farxiga 10 units QD  Inpatient Diabetes Program Recommendations:    Please consider discontinuing Semglee 20 units as he is receiving 70/30 70 units BID which has basal insulin and meal coverage.    Please consider adding Novolog 0-15 units TID and 0-5 units QHS correction.  Will continue to follow while inpatient.  Thank you, Dulce Sellar, MSN, CDCES Diabetes Coordinator Inpatient Diabetes Program [phone] (team pager from 8a-5p)

## 2022-12-04 NOTE — Tx Team (Signed)
Initial Treatment Plan 12/04/2022 10:49 PM Luis [name] [id number]    PATIENT STRESSORS: Financial difficulties   Substance abuse   Other: "Life in general"     PATIENT STRENGTHS: Active sense of humor  Communication skills  General fund of knowledge    PATIENT IDENTIFIED PROBLEMS: Depression  SI  Substance abuse   " I want to work on me"               DISCHARGE CRITERIA:  Ability to meet basic life and health needs Improved stabilization in mood, thinking, and/or behavior Need for constant or close observation no longer present Reduction of life-threatening or endangering symptoms to within safe limits Verbal commitment to aftercare and medication compliance  PRELIMINARY DISCHARGE PLAN: Attend 12-step recovery group Outpatient therapy Return to previous living arrangement  PATIENT/FAMILY INVOLVEMENT: This treatment plan has been presented to and reviewed with the patient, Luis [name].  The patient has been given the opportunity to ask questions and make suggestions.  Daneil Dan, RN 12/04/2022, 10:49 PM

## 2022-12-05 DIAGNOSIS — Z8673 Personal history of transient ischemic attack (TIA), and cerebral infarction without residual deficits: Secondary | ICD-10-CM

## 2022-12-05 LAB — GLUCOSE, CAPILLARY
Glucose-Capillary: 138 mg/dL — ABNORMAL HIGH (ref 70–99)
Glucose-Capillary: 262 mg/dL — ABNORMAL HIGH (ref 70–99)
Glucose-Capillary: 296 mg/dL — ABNORMAL HIGH (ref 70–99)
Glucose-Capillary: 323 mg/dL — ABNORMAL HIGH (ref 70–99)

## 2022-12-05 NOTE — Group Note (Signed)
Date:  12/05/2022 Time:  4:55 PM  Group Topic/Focus:  Dimensions of Wellness:   The focus of this group is to introduce the topic of wellness and discuss the role each dimension of wellness plays in total health.    Participation Level:  Did Not Attend  Participation Quality:   n/a  Affect:   n/a  Cognitive:   n/a  Insight: None  Engagement in Group:   n/a  Modes of Intervention:   n/a  Additional Comments:   Pt did not attend.  Edmund Hilda Falen Lehrmann 12/05/2022, 4:55 PM

## 2022-12-05 NOTE — Group Note (Signed)
Date:  12/05/2022 Time:  6:25 PM  Group Topic/Focus:  Support and Check in- Open Discussion/Journaling    Participation Level:  Did Not Attend  Participation Quality:   n/a  Affect:   n/a  Cognitive:   n/a  Insight: None  Engagement in Group:   n/a  Modes of Intervention:   n/a  Additional Comments:   Pt did not attend.  Edmund Hilda Meria Crilly 12/05/2022, 6:25 PM

## 2022-12-05 NOTE — Progress Notes (Signed)
   12/05/22 2200  Psych Admission Type (Psych Patients Only)  Admission Status Voluntary  Psychosocial Assessment  Patient Complaints Depression  Eye Contact Fair  Facial Expression Flat  Affect Depressed  Speech Logical/coherent  Interaction Assertive;Guarded;Demanding  Motor Activity Other (Comment) (WNL)  Appearance/Hygiene Unremarkable  Behavior Characteristics Unwilling to participate;Irritable  Mood Depressed  Thought Process  Coherency WDL  Content WDL  Delusions None reported or observed  Perception WDL  Hallucination None reported or observed  Judgment Impaired  Confusion WDL  Danger to Self  Current suicidal ideation? Passive  Self-Injurious Behavior No self-injurious ideation or behavior indicators observed or expressed   Agreement Not to Harm Self Yes  Description of Agreement Verbal contract for safety  Danger to Others  Danger to Others None reported or observed

## 2022-12-05 NOTE — Progress Notes (Signed)
   12/05/22 1010  Psych Admission Type (Psych Patients Only)  Admission Status Voluntary  Psychosocial Assessment  Patient Complaints Depression;Anxiety  Eye Contact Brief  Facial Expression Anxious;Flat  Affect Depressed;Irritable  Speech Logical/coherent  Interaction Assertive;Guarded;Isolative  Motor Activity Slow  Appearance/Hygiene Unremarkable  Behavior Characteristics Unwilling to participate;Agitated  Mood Depressed  Thought Process  Coherency WDL  Content WDL  Delusions None reported or observed  Perception WDL  Hallucination None reported or observed  Judgment Impaired  Confusion WDL  Danger to Self  Current suicidal ideation? Passive  Self-Injurious Behavior No self-injurious ideation or behavior indicators observed or expressed   Agreement Not to Harm Self Yes  Description of Agreement verbal  Danger to Others  Danger to Others None reported or observed

## 2022-12-05 NOTE — [facility] Suicide Risk Assessment (Signed)
Suicide Risk Assessment  Admission Assessment    [facility] Admission Suicide Risk Assessment   Nursing information obtained from:  Patient, Review of record Demographic factors:  Male, Living alone, Unemployed, Low socioeconomic status Current Mental Status:  NA Loss Factors:  Financial problems / change in socioeconomic status Historical Factors:  Prior suicide attempts, Impulsivity Risk Reduction Factors:  NA  Total Time spent with patient: 1.5 hours Principal Problem: MDD (major depressive disorder), recurrent severe, without psychosis (HCC) Diagnosis:  Principal Problem:   MDD (major depressive disorder), recurrent severe, without psychosis (HCC) Active Problems:   HLD (hyperlipidemia)   Diabetes (HCC)   Hypertension   GERD (gastroesophageal reflux disease)   Cocaine use disorder, moderate, dependence (HCC)   Morbid obesity (HCC)   Non-ischemic cardiomyopathy (HCC)   Generalized anxiety disorder   Insomnia   Sleep apnea   Acute on chronic combined systolic and diastolic CHF (congestive heart failure) (HCC)   Biventricular ICD (implantable cardioverter-defibrillator) in place   History of stroke  Subjective Data: "I'm tired. I just want to throw my hands up and surrender. I don't think the girl loves me, she goes out every Sunday and stays overnight, lies that she is at her mom's comes back with hickeys, she doesn't pay any bills, someone hacked my cash app and took $1800, I cannot rule her out. My cell phone has been shut, I can't pay it, they are about to turn off my electric.Marland KitchenMarland KitchenI took some extra 3 of my Prazosin pills, and was hoping to go to sleep and never wake up."   Continued Clinical Symptoms: Depressed mood, has not taken medications in over a week, requires continuous hospitalization as medications are restarted and monitored for symptoms and mood stabilization prior to discharge.  Alcohol Use Disorder Identification Test Final Score (AUDIT): 0 The "Alcohol Use Disorders  Identification Test", Guidelines for Use in Primary Care, Second Edition.  World Science writer [facility]). Score between 0-7:  no or low risk or alcohol related problems. Score between 8-15:  moderate risk of alcohol related problems. Score between 16-19:  high risk of alcohol related problems. Score 20 or above:  warrants further diagnostic evaluation for alcohol dependence and treatment.  CLINICAL FACTORS:   Depression:   Anhedonia Impulsivity Insomnia Recent sense of peace/wellbeing Severe Alcohol/Substance Abuse/Dependencies More than one psychiatric diagnosis Unstable or Poor Therapeutic Relationship Previous Psychiatric Diagnoses and Treatments Medical Diagnoses and Treatments/Surgeries   Musculoskeletal: Strength & Muscle Tone: within normal limits Gait & Station: normal Patient leans: N/A  Psychiatric Specialty Exam:  Presentation  General Appearance:  Fairly Groomed  Eye Contact: Fair  Speech: Clear and Coherent  Speech Volume: Normal  Handedness: Right   Mood and Affect  Mood: Anxious; Depressed; Irritable  Affect: Congruent   Thought Process  Thought Processes: Coherent  Descriptions of Associations:Intact  Orientation:Full (Time, Place and Person)  Thought Content:Logical  History of Schizophrenia/Schizoaffective disorder:No  Duration of Psychotic Symptoms:No data recorded Hallucinations:Hallucinations: None  Ideas of Reference:None  Suicidal Thoughts:Suicidal Thoughts: No  Homicidal Thoughts:Homicidal Thoughts: No   Sensorium  Memory: Immediate Good  Judgment: Poor  Insight: Poor   Executive Functions  Concentration: Poor  Attention Span: Fair  Recall: Poor  Fund of Knowledge: Fair  Language: Fair   Psychomotor Activity  Psychomotor Activity: Psychomotor Activity: Psychomotor Retardation   Assets  Assets: Housing; Resilience  Sleep  Sleep: Sleep: Poor   Physical Exam: Physical  Exam Review of Systems  Psychiatric/Behavioral:  Positive for depression and substance abuse. Negative for  hallucinations, memory loss and suicidal ideas. The patient is nervous/anxious and has insomnia.    Blood pressure (!) 144/71, pulse 96, temperature 98.7 F (37.1 C), temperature source Oral, resp. rate 20, height 5\' 5"  (1.651 m), weight (!) 140 kg, SpO2 95 %. Body mass index is 51.36 kg/m.   COGNITIVE FEATURES THAT CONTRIBUTE TO RISK:  None    SUICIDE RISK:   Moderate:  Frequent suicidal ideation with limited intensity, and duration, some specificity in terms of plans, no associated intent, good self-control, limited dysphoria/symptomatology, some risk factors present, and identifiable protective factors, including available and accessible social support.  PLAN OF CARE: See H &P  I certify that inpatient services furnished can reasonably be expected to improve the patient's condition.   Starleen Blue, NP 12/05/2022, 6:13 PM

## 2022-12-05 NOTE — Progress Notes (Signed)
Admission Note:   52 yr male who presents [facility] in no acute distress for the treatment of SI and Depression. Pt appears flat and depressed. Pt denies SI and contracts for safety on admission. Denies AVH. Pt is a high fall risk due to history of previous falls. Pt lives alone and is unemployed. Current stressors are his finances and life in general. Denied alcohol and drug use. Endorsed tobacco use. Pt reported that his depression and anxiety are what brought him in but did not want to further elaborate on the details. Pt stated "I don't want to talk about it." Skin was assessed and found to have tattoos on his bilateral upper and lower extremities. As well as, bruises to his left forearm. Pt was searched and no contraband found, POC and unit policies explained and understanding verbalized. Consents obtained. Food and fluids offered, and food accepted. Pt requested for a CPAP. Verbalized to pt to speak with the daytime care team and pt was agreeable. Pt had no additional questions or concerns.  Per Assessment:  Past psychiatric history significant for MDD, GAD and cocaine use and a medical history significant for CHF, pacemaker-biventricular ICD, HLD, and diabetes type 2 who initially presented to the [facility] urgent care on 6/624 via [employer] reporting worsening depression, cocaine use and ingestion of extra pills. On exam at the [facility], he presented lethargic and there was concerns for possible overdose via ingestion of home medications. Patient was sent to the [facility] emergency department for medical clearance. Today, patient was reevaluated by this provider. Patient denies that taking the extra medications was a suicide attempt. He states that he took the extra pills (unknown) to treat his depression.  He reports worsening depression and describes his depressive symptoms as feelings of sadness, hopelessness, isolating, worthlessness, poor sleep, and poor appetite. He reports binge using  cocaine for the past 8 days, on average an 8 th ball to 3.5 grams.  He states that he last used cocaine on Thursday. He reports using cocaine intermittently since he was in college. Patient's urine drug screen positive for cocaine on arrival. He denies alcohol use. He identifies current stressors as, life is overwhelming, because he is unable to pay his utilities, phone was cut off, Internet and cable was cut off because his cash app account was hacked and he does not have access to his money. He states that he also does not feel loved by his family or significant other. He denies active suicidal ideations but states that he does not feel safe returning home due to his worsening depression and cocaine use. He denies homicidal ideations.  He denies auditory or visual hallucinations. There is no objective evidence that the patient is currently responding to internal or external stimuli. He reports outpatient psychiatry with the [facility] in [place]. He is unable to recall his current medication regimen but states that the [facility] has not changed his medications since he discharged from the [facility] in May-2024.

## 2022-12-05 NOTE — Group Note (Signed)
Date:  12/05/2022 Time:  4:37 PM  Group Topic/Focus:  Goals Group:   The focus of this group is to help patients establish daily goals to achieve during treatment and discuss how the patient can incorporate goal setting into their daily lives to aide in recovery. Orientation:   The focus of this group is to educate the patient on the purpose and policies of crisis stabilization and provide a format to answer questions about their admission.  The group details unit policies and expectations of patients while admitted.    Participation Level:  Did Not Attend  Participation Quality:   n/a  Affect:   n/a  Cognitive:   n/a  Insight: None  Engagement in Group:   n/a  Modes of Intervention:   n/a  Additional Comments:   Pt did not attend.  Edmund Hilda Nerissa Constantin 12/05/2022, 4:37 PM

## 2022-12-05 NOTE — Group Note (Signed)
Date:  12/05/2022 Time:  10:20 PM      Participation Level:  Minimal  Participation Quality:  Appropriate  Affect:  Depressed, Irritable, and Tearful  Cognitive:  Alert and Appropriate  Insight: Appropriate and Good  Engagement in Group:  Engaged  Modes of Intervention:  Discussion and Education  Additional Comments:  Pt attended and participated in wrap up group this evening and rated their day a 5/10, due to them "feeling in a fog". Pt states that they do not like crowds so being in the dayroom is a lot. Pt goal is to love themselves again and to stop looking for validation from others.   Chrisandra Netters 12/05/2022, 10:20 PM

## 2022-12-05 NOTE — Progress Notes (Signed)
   12/05/22 0600  15 Minute Checks  Location Bedroom  Visual Appearance Calm  Behavior Sleeping  Sleep (Behavioral Health Patients Only)  Calculate sleep? (Click Yes once per 24 hr at 0600 safety check) Yes  Documented sleep last 24 hours 7.75

## 2022-12-05 NOTE — H&P (Signed)
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 51.36 kg/m.  Treatment Plan Summary: Daily contact with patient to assess and evaluate symptoms and progress in treatment and Medication management  Safety and Monitoring: Voluntary admission to inpatient psychiatric unit for safety, stabilization and treatment Daily contact with patient to assess and evaluate symptoms and progress in treatment Patient's case to be discussed in multi-disciplinary team meeting Observation Level : q15 minute checks Vital signs: q12 hours Precautions: Safety Patient may use CPAP machine nightly, when not being used CPAP machine must be in the med room.  Return machine to respiratory therapy at [facility] after patient is discharged.  Long Term Goal(s): Improvement in symptoms so as  ready for discharge  Short Term Goals: Ability to identify changes in lifestyle to reduce recurrence of condition will improve, Ability to verbalize feelings will improve, Ability to disclose and discuss suicidal ideas, Ability to demonstrate self-control will improve, Ability to identify and develop effective coping behaviors will improve, Ability to maintain clinical measurements within normal limits will improve, Compliance with prescribed medications will improve, and Ability to identify triggers associated with substance abuse/mental health issues will improve  Diagnoses Principal Problem:   MDD (major depressive disorder), recurrent severe, without psychosis (HCC) Active Problems:   HLD (hyperlipidemia)   Diabetes (HCC)   Hypertension   GERD (gastroesophageal reflux disease)   Cocaine use disorder, moderate, dependence (HCC)   Morbid obesity (HCC)   Non-ischemic cardiomyopathy (HCC)   Generalized anxiety disorder   Insomnia   Sleep apnea   Acute on chronic combined systolic and diastolic CHF (congestive heart failure) (HCC)   Biventricular ICD (implantable cardioverter-defibrillator) in place   History of stroke  Medications: Resumed on home medications by admitting provider as follows, and agreeable to continuing medications as follows: -Abilify 5 mg daily for mood stabilization -Gabapentin 300 mg 3 times daily for GAD/neuropathic pain -Prazosin 2 mg nightly for PTSD related nightmares -Continue hydroxyzine 25 mg 3 times daily as needed for anxiety -Continue trazodone 25 mg nightly as needed for sleep  Medications for medical reasons: (home meds) -Continue Coreg 3.125 mg twice daily with meals for CHF -Continue to Digoxin 0.125 mg daily for CHF -Continue rosuvastatin 40 mg daily for hyperlipidemia -Continue potassium chloride 20 mEq daily -Continue insulin NovoLog 0 to 15 mg as per sliding scale 3 times daily -Continue  insulin Semglee 20 units nightly at bedtime for  diabetes -Continue Plavix 75 daily for blood clot prophylaxis -Continue torsemide 60 mg daily for edema  Other PRNS -Continue Tylenol 650 mg every 6 hours PRN for mild pain -Continue Maalox 30 mg every 4 hrs PRN for indigestion -Continue Milk of Magnesia as needed every 6 hrs for constipation  TSH, lipid panel, and hemoglobin A1c last completed on 10/05/18/2024.  EKG with QTc prolonged at 498, will recheck in a 1-2 days, pt with pacer in place, asymptomatic with no chest pain.   Discharge Planning: Social work and case management to assist with discharge planning and identification of hospital follow-up needs prior to discharge Estimated LOS: 5-7 days Discharge Concerns: Need to establish a safety plan; Medication compliance and effectiveness Discharge Goals: Return home with outpatient referrals for mental health follow-up including medication management/psychotherapy  I certify that inpatient services furnished can reasonably be expected to improve the patient's condition.    Starleen Blue, NP 6/8/20246:10 PM

## 2022-12-06 LAB — GLUCOSE, CAPILLARY
Glucose-Capillary: 272 mg/dL — ABNORMAL HIGH (ref 70–99)
Glucose-Capillary: 312 mg/dL — ABNORMAL HIGH (ref 70–99)
Glucose-Capillary: 311 mg/dL — ABNORMAL HIGH (ref 70–99)
Glucose-Capillary: 314 mg/dL — ABNORMAL HIGH (ref 70–99)

## 2022-12-06 MED ORDER — ACETAMINOPHEN 325 MG PO TABS
650.0000 mg | ORAL_TABLET | Freq: Once | ORAL | Status: AC
  Administered 2022-12-06: 650 mg via ORAL
  Filled 2022-12-06: qty 2

## 2022-12-06 MED ORDER — MELATONIN 5 MG PO TABS
5.0000 mg | ORAL_TABLET | Freq: Every day | ORAL | Status: DC
  Administered 2022-12-06 – 2022-12-07 (×2): 5 mg via ORAL
  Filled 2022-12-06 (×2): qty 1

## 2022-12-06 MED ORDER — NITROGLYCERIN 0.4 MG SL SUBL
0.4000 mg | SUBLINGUAL_TABLET | Freq: Once | SUBLINGUAL | Status: AC
  Administered 2022-12-06: 0.4 mg via SUBLINGUAL
  Filled 2022-12-06: qty 1

## 2022-12-06 MED ORDER — NITROGLYCERIN 0.4 MG SL SUBL: 0.4000 mg | SUBLINGUAL_TABLET | Freq: Every day | SUBLINGUAL | Status: DC | PRN

## 2022-12-06 MED ORDER — ALBUTEROL SULFATE HFA 108 (90 BASE) MCG/ACT IN AERS
2.0000 | INHALATION_SPRAY | RESPIRATORY_TRACT | Status: DC | PRN
  Administered 2022-12-06: 2 via RESPIRATORY_TRACT
  Filled 2022-12-06: qty 6.7

## 2022-12-06 MED ORDER — GLUCERNA SHAKE PO LIQD
237.0000 mL | Freq: Three times a day (TID) | ORAL | Status: DC
  Administered 2022-12-06: 237 mL via ORAL
  Filled 2022-12-06 (×9): qty 237

## 2022-12-06 MED ORDER — TRAZODONE HCL 100 MG PO TABS
100.0000 mg | ORAL_TABLET | Freq: Every evening | ORAL | Status: DC | PRN
  Administered 2022-12-06 – 2022-12-07 (×2): 100 mg via ORAL
  Filled 2022-12-06 (×2): qty 1

## 2022-12-06 NOTE — [facility] Suicide Risk Assessment (Signed)
[facility] INPATIENT:  Family/Significant Other Suicide Prevention Education  Suicide Prevention Education:  Patient Refusal for Family/Significant Other Suicide Prevention Education: The patient Luis [name] has refused to provide written consent for family/significant other to be provided Family/Significant Other Suicide Prevention Education during admission and/or prior to discharge.  Physician notified.  Khamille Beynon A Jaislyn Blinn 12/06/2022, 10:49 AM

## 2022-12-06 NOTE — Plan of Care (Signed)
  Problem: Education: Goal: Ability to describe self-care measures that may prevent or decrease complications (Diabetes Survival Skills Education) will improve Outcome: Progressing Goal: Individualized Educational Video(s) Outcome: Progressing   Problem: Coping: Goal: Ability to adjust to condition or change in health will improve Outcome: Progressing   

## 2022-12-06 NOTE — ED Provider Notes (Signed)
[place] EMERGENCY DEPARTMENT AT [facility] Provider Note   CSN: [id number] Arrival date & time: 12/06/22  2305     History {Add pertinent medical, surgical, social history, OB history to HPI:1} Chief Complaint  Patient presents with   Chest Pain    Luis [name] is a 52 y.o. male.  52 yo male with prior mental health diagnoses of MDD, GAD, PTSD, cocaine use d/o & medical diagnoses of DM, CHF with a pacer in place & sleep apnea presenting from behavioral health [facility] where he has been for the past 3 days with chest pain that onset tonight around 10:00.  States it woke her from sleep.  Pain has been constant in the center of his chest and radiates to his right arm and neck.  Pain has not come any better or worse.  Some associated shortness of breath and nausea.  No diaphoresis.  He has never had this kind of pain before.  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  Allergic to aspirin.  Refused nitro per EMS.  Pain is constant and radiates to his right arm and his right neck.  Chronic back pain which is unchanged.  No abdominal pain.  No leg pain or leg swelling.  States compliance with his medications.  Denies any recent cocaine use. States his EF is "19%".  He has a pacemaker and AICD.  The history is provided by the patient and the EMS personnel.  Chest Pain Associated symptoms: nausea and shortness of breath   Associated symptoms: no abdominal pain, no dizziness, no headache, no vomiting and no weakness        Home Medications Prior to Admission medications   Medication Sig Start Date End Date Taking? Authorizing Provider  acetaminophen (TYLENOL) 325 MG tablet Take 2 tablets (650 mg total) by mouth every 6 (six) hours as needed for mild pain. 10/26/22   Evette Georges, MD  ARIPiprazole (ABILIFY) 5 MG tablet Take 1 tablet (5 mg total) by mouth daily. 10/18/22 11/17/22  Karie Fetch, MD  carvedilol (COREG) 3.125 MG tablet Take 3.125 mg by mouth 2  (two) times daily with a meal.    [provider]  clopidogrel (PLAVIX) 75 MG tablet Take 1 tablet (75 mg total) by mouth daily. 10/28/22   Carrion-Carrero, Karle Starch, MD  dapagliflozin propanediol (FARXIGA) 10 MG TABS tablet Take 1 tablet (10 mg total) by mouth daily. 05/25/22   Laurey Morale, MD  digoxin (LANOXIN) 0.125 MG tablet Take 0.125 mg by mouth daily.    [provider]  DULoxetine (CYMBALTA) 30 MG capsule Take 3 capsules (90 mg total) by mouth daily. 10/18/22 11/17/22  Karie Fetch, MD  gabapentin (NEURONTIN) 300 MG capsule Take 1 capsule (300 mg total) by mouth 3 (three) times daily. 10/17/22 11/16/22  Karie Fetch, MD  hydrOXYzine (ATARAX) 25 MG tablet Take 1 tablet (25 mg total) by mouth 3 (three) times daily as needed for anxiety. 09/03/21   Nwoko, Tommas Olp, PA  insulin aspart protamine- aspart (NOVOLOG MIX 70/30) (70-30) 100 UNIT/ML injection Inject 0.7 mLs (70 Units total) into the skin 2 (two) times daily with breakfast and lunch. 10/17/22 11/16/22  Karie Fetch, MD  insulin glargine-yfgn (SEMGLEE) 100 UNIT/ML injection Inject 0.2 mLs (20 Units total) into the skin at bedtime. 10/06/22   Ardis Hughs, NP  potassium chloride SA (KLOR-CON M) 20 MEQ tablet Take 20 mEq by mouth daily.    [provider]  prazosin (MINIPRESS) 2 MG capsule Take 1 capsule (2 mg total) by mouth at bedtime. 09/03/21   Nwoko, Tommas Olp, PA  rosuvastatin (CRESTOR) 40 MG tablet Take 1 tablet (40 mg total) by mouth daily. 10/27/22   Evette Georges, MD  sacubitril-valsartan (ENTRESTO) 97-103 MG Take 1 tablet by mouth 2 (two) times daily. 10/06/22   Ardis Hughs, NP  spironolactone (ALDACTONE) 25 MG tablet Take 1 tablet (25 mg total) by mouth daily. 10/07/22   Ardis Hughs, NP  torsemide (DEMADEX) 20 MG tablet Take 3 tablets (60 mg total) by mouth daily. 05/25/22   Laurey Morale, MD  traZODone (DESYREL) 300 MG tablet Take 1 tablet (300 mg total) by mouth at bedtime as  needed for sleep. 10/17/22 11/16/22  Karie Fetch, MD      Allergies    Aspirin, Iodine-131, Peanut (diagnostic), Iodinated contrast media, Latex, Penicillins, Ultram [tramadol], Zestril [lisinopril], Cinnamon, Amoxicillin-pot clavulanate, and Lidocaine    Review of Systems   Review of Systems  Constitutional:  Negative for activity change and appetite change.  HENT:  Negative for congestion and rhinorrhea.   Respiratory:  Positive for chest tightness and shortness of breath.   Cardiovascular:  Positive for chest pain.  Gastrointestinal:  Positive for nausea. Negative for abdominal pain and vomiting.  Genitourinary:  Negative for dysuria and hematuria.  Musculoskeletal:  Negative for arthralgias and myalgias.  Skin:  Negative for rash.  Neurological:  Negative for dizziness, weakness and headaches.   all other systems are negative except as noted in the HPI and PMH.    Physical Exam Updated Vital Signs BP 136/72 (BP Location: Right Arm)   Pulse 95   Temp 98.8 F (37.1 C) (Oral)   Resp 18   Ht 5\' 5"  (1.651 m)   Wt (!) 140 kg   SpO2 94%   BMI 51.36 kg/m  Physical Exam Vitals and nursing note reviewed.  Constitutional:      General: He is not in acute distress.    Appearance: He is well-developed. He is obese.  HENT:     Head: Normocephalic and atraumatic.     Mouth/Throat:     Pharynx: No oropharyngeal exudate.  Eyes:     Conjunctiva/sclera: Conjunctivae normal.     Pupils: Pupils are equal, round, and reactive to light.  Neck:     Comments: No meningismus. Cardiovascular:     Rate and Rhythm: Normal rate and regular rhythm.     Heart sounds: Normal heart sounds. No murmur heard. Pulmonary:     Effort: Pulmonary effort is normal. No respiratory distress.     Breath sounds: Normal breath sounds.  Chest:     Chest wall: No tenderness.  Abdominal:     Palpations: Abdomen is soft.     Tenderness: There is no abdominal tenderness. There is no guarding or rebound.   Musculoskeletal:        General: No tenderness. Normal range of motion.     Cervical back: Normal range of motion and neck supple.  Skin:    General: Skin is warm.     Capillary Refill: Capillary refill takes less than 2 seconds.  Neurological:     General: No focal deficit present.     Mental Status: He is alert and oriented to person, place, and time. Mental status is at baseline.     Cranial Nerves: No cranial nerve deficit.     Motor: No abnormal muscle tone.     Coordination: Coordination normal.  Comments: No ataxia on finger to nose bilaterally. No pronator drift. 5/5 strength throughout. CN 2-12 intact.Equal grip strength. Sensation intact.   Psychiatric:        Behavior: Behavior normal.     ED Results / Procedures / Treatments   Labs (all labs ordered are listed, but only abnormal results are displayed) Labs Reviewed  GLUCOSE, CAPILLARY - Abnormal; Notable for the following components:      Result Value   Glucose-Capillary 229 (*)    All other components within normal limits  GLUCOSE, CAPILLARY - Abnormal; Notable for the following components:   Glucose-Capillary 138 (*)    All other components within normal limits  GLUCOSE, CAPILLARY - Abnormal; Notable for the following components:   Glucose-Capillary 262 (*)    All other components within normal limits  GLUCOSE, CAPILLARY - Abnormal; Notable for the following components:   Glucose-Capillary 296 (*)    All other components within normal limits  GLUCOSE, CAPILLARY - Abnormal; Notable for the following components:   Glucose-Capillary 323 (*)    All other components within normal limits  GLUCOSE, CAPILLARY - Abnormal; Notable for the following components:   Glucose-Capillary 272 (*)    All other components within normal limits  GLUCOSE, CAPILLARY - Abnormal; Notable for the following components:   Glucose-Capillary 312 (*)    All other components within normal limits  GLUCOSE, CAPILLARY - Abnormal; Notable  for the following components:   Glucose-Capillary 311 (*)    All other components within normal limits  GLUCOSE, CAPILLARY - Abnormal; Notable for the following components:   Glucose-Capillary 314 (*)    All other components within normal limits  CK TOTAL AND CKMB (NOT AT [facility])  TROPONIN T  CBC WITH DIFFERENTIAL/PLATELET  COMPREHENSIVE METABOLIC PANEL  LIPASE, BLOOD  D-DIMER, QUANTITATIVE  TROPONIN I (HIGH SENSITIVITY)    EKG None  Radiology No results found.  Procedures Procedures  {Document cardiac monitor, telemetry assessment procedure when appropriate:1}  Medications Ordered in ED Medications  carvedilol (COREG) tablet 3.125 mg (3.125 mg Oral Given 12/06/22 1658)  clopidogrel (PLAVIX) tablet 75 mg (75 mg Oral Patient Refused/Not Given 12/06/22 0809)  dapagliflozin propanediol (FARXIGA) tablet 10 mg (10 mg Oral Given 12/06/22 0808)  digoxin (LANOXIN) tablet 0.125 mg (0.125 mg Oral Given 12/06/22 0808)  hydrOXYzine (ATARAX) tablet 25 mg (25 mg Oral Given 12/06/22 2111)  insulin glargine-yfgn (SEMGLEE) injection 20 Units (20 Units Subcutaneous Given 12/06/22 2111)  potassium chloride SA (KLOR-CON M) CR tablet 20 mEq (20 mEq Oral Given 12/06/22 0808)  prazosin (MINIPRESS) capsule 2 mg (2 mg Oral Given 12/06/22 2111)  rosuvastatin (CRESTOR) tablet 40 mg (40 mg Oral Given 12/06/22 0807)  torsemide (DEMADEX) tablet 60 mg (60 mg Oral Given 12/06/22 0808)  ARIPiprazole (ABILIFY) tablet 5 mg (5 mg Oral Given 12/06/22 0807)  gabapentin (NEURONTIN) capsule 300 mg (300 mg Oral Given 12/06/22 1700)  acetaminophen (TYLENOL) tablet 650 mg (has no administration in time range)  alum & mag hydroxide-simeth (MAALOX/MYLANTA) 200-200-20 MG/5ML suspension 30 mL (has no administration in time range)  magnesium hydroxide (MILK OF MAGNESIA) suspension 30 mL (has no administration in time range)  haloperidol (HALDOL) tablet 5 mg (has no administration in time range)    Or  haloperidol lactate (HALDOL) injection 5 mg  (has no administration in time range)  LORazepam (ATIVAN) tablet 2 mg (has no administration in time range)    Or  LORazepam (ATIVAN) injection 2 mg (has no administration in time range)  diphenhydrAMINE (BENADRYL) capsule  50 mg (has no administration in time range)    Or  diphenhydrAMINE (BENADRYL) injection 50 mg (has no administration in time range)  insulin aspart (novoLOG) injection 0-15 Units (11 Units Subcutaneous Given 12/06/22 1658)  insulin aspart (novoLOG) injection 0-5 Units (4 Units Subcutaneous Given 12/06/22 2114)  feeding supplement (GLUCERNA SHAKE) (GLUCERNA SHAKE) liquid 237 mL (237 mLs Oral Patient Refused/Not Given 12/06/22 2111)  traZODone (DESYREL) tablet 100 mg (100 mg Oral Given 12/06/22 2111)  melatonin tablet 5 mg (5 mg Oral Given 12/06/22 2112)  albuterol (VENTOLIN HFA) 108 (90 Base) MCG/ACT inhaler 2 puff (2 puffs Inhalation Given 12/06/22 2139)  acetaminophen (TYLENOL) tablet 650 mg (has no administration in time range)  nitroGLYCERIN (NITROSTAT) SL tablet 0.4 mg (has no administration in time range)    ED Course/ Medical Decision Making/ A&P   {   Click here for ABCD2, HEART and other calculatorsREFRESH Note before signing :1}                          Medical Decision Making Amount and/or Complexity of Data Reviewed Labs: ordered. Decision-making details documented in ED Course. Radiology: ordered and independent interpretation performed. Decision-making details documented in ED Course. ECG/medicine tests: ordered and independent interpretation performed. Decision-making details documented in ED Course.  Risk OTC drugs. Prescription drug management.  Episode of chest pain that onset tonight at about 10:00 and has been constant.  Radiates to right arm and right neck.  History of nonischemic cardiomyopathy with reduced ejection fraction, AICD in place.  Cardiac catheterization in 2023 showed no obstructive CAD.  Last echocardiogram with EF of 25%  {Document  critical care time when appropriate:1} {Document review of labs and clinical decision tools ie heart score, Chads2Vasc2 etc:1}  {Document your independent review of radiology images, and any outside records:1} {Document your discussion with family members, caretakers, and with consultants:1} {Document social determinants of health affecting pt's care:1} {Document your decision making why or why not admission, treatments were needed:1} Final Clinical Impression(s) / ED Diagnoses Final diagnoses:  None    Rx / DC Orders ED Discharge Orders     None

## 2022-12-06 NOTE — Progress Notes (Addendum)
   12/06/22 2200  Vitals  BP 136/72  BP Location Right Arm  BP Method Automatic  Patient Position (if appropriate) Sitting  Pulse Rate 95  Pulse Rate Source Dinamap  Resp 18   Pt reported 9/10 nonspecific chest pain. Described the pain as sharp and stabbing. Pt stated that the chest pain has been on and off throughout the day. Pain is radiating from his neck to his right arm. Complains of nausea and shortness of breath.Given PRN Albuterol per MAR. Patient is not in distress. EKG and vitals obtained. Notified Roselyn Bering, NP. Will continue to monitor.

## 2022-12-06 NOTE — Group Note (Signed)
Date:  12/06/2022 Time:  4:34 PM  Group Topic/Focus:  Goals Group:   The focus of this group is to help patients establish daily goals to achieve during treatment and discuss how the patient can incorporate goal setting into their daily lives to aide in recovery. Orientation:   The focus of this group is to educate the patient on the purpose and policies of crisis stabilization and provide a format to answer questions about their admission.  The group details unit policies and expectations of patients while admitted.    Participation Level:  None  Participation Quality:   n/a  Affect:  Flat  Cognitive:   n/a  Insight: None  Engagement in Group:  None  Modes of Intervention:  Activity, Discussion, and Orientation  Additional Comments:   Pt attended the Orientation/Goals group however did not participate.  Edmund Hilda Oluwatobi Ruppe 12/06/2022, 4:34 PM
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
## 2022-12-06 NOTE — Progress Notes (Signed)
Patient states that he had a rough start to his day since he experienced chest pain and felt that the staff didn't take his complaints seriously. He played bingo with his peers as a means of coping. His goal for tomorrow is to inquire about getting discharged to a long-term drug treatment center.

## 2022-12-06 NOTE — Group Note (Unsigned)
Date:  12/06/2022 Time:  5:13 PM  Group Topic/Focus:  Dimensions of Wellness:   The focus of this group is to introduce the topic of wellness and discuss the role each dimension of wellness plays in total health. Emotional Education:   The focus of this group is to discuss what feelings/emotions are, and how they are experienced.     Participation Level:  {B[facility] PARTICIPATION MWUXL:24401}  Participation Quality:  {BHH PARTICIPATION QUALITY:22265}  Affect:  {BHH AFFECT:22266}  Cognitive:  {B[facility] COGNITIVE:22267}  Insight: {BHH Insight2:20797}  Engagement in Group:  {B[facility] ENGAGEMENT IN UUVOZ:36644}  Modes of Intervention:  {[facility] MODES OF INTERVENTION:22269}  Additional Comments:  ***  Thessaly Mccullers M Teonia Yager 12/06/2022, 5:13 PM

## 2022-12-06 NOTE — Progress Notes (Signed)
   12/06/22 0800  Psych Admission Type (Psych Patients Only)  Admission Status Voluntary  Psychosocial Assessment  Patient Complaints Anxiety;Depression  Eye Contact Fair  Facial Expression Flat  Affect Depressed;Blunted  Speech Logical/coherent  Interaction Assertive;Demanding  Motor Activity Other (Comment)  Appearance/Hygiene Unremarkable  Behavior Characteristics Unwilling to participate;Irritable  Mood Depressed  Thought Process  Coherency WDL  Content WDL  Delusions None reported or observed  Perception WDL  Hallucination None reported or observed  Judgment Impaired  Confusion WDL  Danger to Self  Current suicidal ideation? Passive  Self-Injurious Behavior No self-injurious ideation or behavior indicators observed or expressed   Agreement Not to Harm Self Yes  Description of Agreement Verbal  Danger to Others  Danger to Others None reported or observed

## 2022-12-06 NOTE — Progress Notes (Signed)
[facility] MD Progress Note  12/06/2022 3:21 PM Luis [name]  MRN:  [id number] Principal Problem: MDD (major depressive disorder), recurrent severe, without psychosis (HCC) Diagnosis: Principal Problem:   MDD (major depressive disorder), recurrent severe, without psychosis (HCC) Active Problems:   HLD (hyperlipidemia)   Diabetes (HCC)   Hypertension   GERD (gastroesophageal reflux disease)   Cocaine use disorder, moderate, dependence (HCC)   Morbid obesity (HCC)   Non-ischemic cardiomyopathy (HCC)   Generalized anxiety disorder   Insomnia   Sleep apnea   Acute on chronic combined systolic and diastolic CHF (congestive heart failure) (HCC)   Biventricular ICD (implantable cardioverter-defibrillator) in place   History of stroke  Reason for Admission: Luis [name] is a 51 yo male with prior mental health diagnoses of MDD, GAD, PTSD, cocaine use d/o & medical diagnoses of DM, CHF with a pacer in place & sleep apnea who presented to the [name] urgent care center [facility]) on 06/06 with law enforcement who responded when his life alert went off. As [facility], pt reported to law enforcement that it accidentally went off, but also endorsed suicidal ideations, reported having a plan, & stated he had overdosed on 3 extra Prazosin pills the night prior. While at the [facility], pt continued to report worsening depressive symptoms in the context of psychosocial stressors, was transferred to the [facility] [place] for medical clearance prior to being transferred to this [facility] voluntarily on 6/7 for treatment and stabilization of his mental status.  Last admission to this behavioral health Hospital was on 10/06/2022 with a follow-up planned for the [place] in [place].   24 chart review Sleep Hours last night: 9.25 hrs last night as per nursing documentation. Nursing Concerns: none reported or documented Behavioral episodes in the past 24 WUJ:WJXB  Medication Compliance:  Compliant  Vital Signs in the past 24 hrs: Slightly elevated earlier today morning with SBP in the 140s, but currently WNL at 136/65, and HR-92. PRN Medications in the past 24 hrs: Trazodone 25 mg  Patient assessment note: During today's encounter, pt denies SI, denies HI/AVH, denied paranoia and there was no evidence of delusional thoughts. He reports being in constant thoughts of of what was happening at his house, and what his GF was doing there, as she is not answering his calls. Pt educated to stay focused on himself and his treatment here at the hospital.  He reports a poor sleep quality last night, states that he takes 250 mg of Trazodone nightly at home, and only got 25 mg last night which was not helpful. Pt has been educated that he will not be given 250 mg of Trazodone due to his cardiac history and also a history of sleep apnea since we do not want to suppress his respiratory drive.  He has been educated that 100 mg of Trazodone will be ordered along with Melatonin 5mg  for sleep. He reports appetite as being poor, asking for Ensure shakes, and has been educated that Glucerna will be ordered due to his history of DM.   Pt reports today afternoon that he has chest pain, EKG ordered and completed, and pacer in place, and adequately pacing. EKG as follows: "Vent. rate 95 BPM PR interval 158 ms QRS duration 146 ms QT/QTcB 408/512 ms P-R-T axes 72 73 243 Atrial-sensed ventricular-paced rhythm".  Chest pain may be related to recent cocaine use, but cardiac enzymes will be ordered to be drawn tonight, V/S are WNL: HR-92, R-19, BP-136/65, O2  sat-97%. Pt does not seem to be in any distress. He had asked for Oxycodone for back pain earlier today morning and was educated that controlled substances will not be prescribed to him this admission. Some of these complains might be in efforts to get Oxycodone. He was educated to take Tylenol which was offered to him by his nurse, but he refused it. We are  ordering cardiac enzyme tests for him to rule out any cardiac related causes of his complaints. Labs to be drawn tonight, but chest pain so far has subsided, and pt is in bed resting with respirations even and unlabored with no signs of distress.  Writer consulted on call Cardiologist (Dr Diona Browner) via pager # [phone] who returned call immediately. Dr MD reviewed pt's records and stated that Qtc is not significant since there is pacing going on. This was in response to being asked by writer if there is reason to worry that the qtc today is slightly prolonged than the one obtained 0n 06/07. Dr Diona Browner stated that pt's chest pain is most likely related to his recent cocaine use, and not acute coronary related. He however advised that if troponin comes back later elevated, pt should be sent to the ER for further evaluation due to his cardiac history even though it is not uncommon to have some Troponin elevations in cocaine use. We are continuing to monitor symptoms. Pt is continuing to refuse Tylenol for pain, and stating he is preferring Oxycodone.   Total Time spent with patient: 45 minutes  Past Psychiatric History: See H & P  Past Medical History:  Past Medical History:  Diagnosis Date   Anxiety    Biventricular ICD (implantable cardioverter-defibrillator) in place    Chronic systolic CHF (congestive heart failure) (HCC)    Cocaine use    Diabetes mellitus without complication (HCC)    GERD (gastroesophageal reflux disease)    HLD (hyperlipidemia)    Hypertension    Morbid obesity (HCC)    NICM (nonischemic cardiomyopathy) (HCC)    OSA (obstructive sleep apnea)    PTSD (post-traumatic stress disorder)    on Depakote   Refusal of blood transfusions as patient is Jehovah's Witness    Tobacco abuse     Past Surgical History:  Procedure Laterality Date   BIV ICD INSERTION CRT-D     FOOT FRACTURE SURGERY     ICD GENERATOR CHANGEOUT N/A 03/23/2022   Procedure: ICD GENERATOR CHANGEOUT;   Surgeon: Nelly Laurence, Roberts Gaudy, MD;  Location: [facility] INVASIVE CV LAB;  Service: Cardiovascular;  Laterality: N/A;   RIGHT HEART CATH N/A 03/06/2022   Procedure: RIGHT HEART CATH;  Surgeon: Orbie Pyo, MD;  Location: [facility] INVASIVE CV LAB;  Service: Cardiovascular;  Laterality: N/A;   RIGHT/LEFT HEART CATH AND CORONARY ANGIOGRAPHY N/A 04/16/2022   Procedure: RIGHT/LEFT HEART CATH AND CORONARY ANGIOGRAPHY;  Surgeon: Laurey Morale, MD;  Location: [facility] INVASIVE CV LAB;  Service: Cardiovascular;  Laterality: N/A;   ROTATOR CUFF REPAIR     TONSILLECTOMY     Family History:  Family History  Problem Relation Age of Onset   Hypertension Mother    Bone cancer Father    Lung cancer Father    Family Psychiatric  History: See H & P Social History:  Social History   Substance and Sexual Activity  Alcohol Use Not Currently     Social History   Substance and Sexual Activity  Drug Use Yes   Types: Cocaine, Marijuana   Comment: using in  binges, 1/4gm daily x 2 weeks.    Social History   Socioeconomic History   Marital status: Widowed    Spouse name: Not on file   Number of children: 7   Years of education: Not on file   Highest education level: Bachelor's degree (e.g., BA, AB, BS)  Occupational History   Occupation: disability  Tobacco Use   Smoking status: Every Day    Packs/day: 0.50    Years: 15.00    Additional pack years: 0.00    Total pack years: 7.50    Types: Cigarettes    Last attempt to quit: 10/2021    Years since quitting: 1.1   Smokeless tobacco: Never   Tobacco comments:    occasionally  Vaping Use   Vaping Use: Never used  Substance and Sexual Activity   Alcohol use: Not Currently   Drug use: Yes    Types: Cocaine, Marijuana    Comment: using in binges, 1/4gm daily x 2 weeks.   Sexual activity: Not Currently  Other Topics Concern   Not on file  Social History Narrative   Not on file   Social Determinants of Health   Financial Resource Strain: High Risk  (11/16/2022)   Overall Financial Resource Strain (CARDIA)    Difficulty of Paying Living Expenses: Hard  Food Insecurity: Food Insecurity Present (12/04/2022)   Hunger Vital Sign    Worried About Running Out of Food in the Last Year: Sometimes true    Ran Out of Food in the Last Year: Sometimes true  Transportation Needs: No Transportation Needs (12/04/2022)   PRAPARE - Administrator, Civil Service (Medical): No    Lack of Transportation (Non-Medical): No  Recent Concern: Transportation Needs - Unmet Transportation Needs (11/16/2022)   PRAPARE - Administrator, Civil Service (Medical): No    Lack of Transportation (Non-Medical): Yes  Physical Activity: Not on file  Stress: Not on file  Social Connections: Not on file   Sleep: Poor  Appetite:  Good  Current Medications: Current Facility-Administered Medications  Medication Dose Route Frequency Provider Last Rate Last Admin   acetaminophen (TYLENOL) tablet 650 mg  650 mg Oral Q6H PRN White, Patrice L, NP       albuterol (VENTOLIN HFA) 108 (90 Base) MCG/ACT inhaler 2 puff  2 puff Inhalation Q4H PRN Clarisa Danser, NP       alum & mag hydroxide-simeth (MAALOX/MYLANTA) 200-200-20 MG/5ML suspension 30 mL  30 mL Oral Q4H PRN White, Patrice L, NP       ARIPiprazole (ABILIFY) tablet 5 mg  5 mg Oral Daily White, Patrice L, NP   5 mg at 12/06/22 0807   carvedilol (COREG) tablet 3.125 mg  3.125 mg Oral BID WC White, Patrice L, NP   3.125 mg at 12/06/22 1610   clopidogrel (PLAVIX) tablet 75 mg  75 mg Oral Daily White, Patrice L, NP       dapagliflozin propanediol (FARXIGA) tablet 10 mg  10 mg Oral Daily White, Patrice L, NP   10 mg at 12/06/22 9604   digoxin (LANOXIN) tablet 0.125 mg  0.125 mg Oral Daily White, Patrice L, NP   0.125 mg at 12/06/22 5409   diphenhydrAMINE (BENADRYL) capsule 50 mg  50 mg Oral TID PRN White, Patrice L, NP       Or   diphenhydrAMINE (BENADRYL) injection 50 mg  50 mg Intramuscular TID PRN White,  Patrice L, NP       feeding supplement (  GLUCERNA SHAKE) (GLUCERNA SHAKE) liquid 237 mL  237 mL Oral TID BM Benny Henrie, NP       gabapentin (NEURONTIN) capsule 300 mg  300 mg Oral TID White, Patrice L, NP   300 mg at 12/06/22 1254   haloperidol (HALDOL) tablet 5 mg  5 mg Oral TID PRN White, Patrice L, NP       Or   haloperidol lactate (HALDOL) injection 5 mg  5 mg Intramuscular TID PRN White, Patrice L, NP       hydrOXYzine (ATARAX) tablet 25 mg  25 mg Oral TID PRN White, Patrice L, NP       insulin aspart (novoLOG) injection 0-15 Units  0-15 Units Subcutaneous TID WC Bobbitt, Shalon E, NP   11 Units at 12/06/22 1253   insulin aspart (novoLOG) injection 0-5 Units  0-5 Units Subcutaneous QHS Bobbitt, Shalon E, NP   4 Units at 12/05/22 2202   insulin glargine-yfgn (SEMGLEE) injection 20 Units  20 Units Subcutaneous QHS White, Patrice L, NP   20 Units at 12/05/22 2202   LORazepam (ATIVAN) tablet 2 mg  2 mg Oral TID PRN White, Patrice L, NP       Or   LORazepam (ATIVAN) injection 2 mg  2 mg Intramuscular TID PRN White, Patrice L, NP       magnesium hydroxide (MILK OF MAGNESIA) suspension 30 mL  30 mL Oral Daily PRN White, Patrice L, NP       melatonin tablet 5 mg  5 mg Oral QHS Isaid Salvia, NP       potassium chloride SA (KLOR-CON M) CR tablet 20 mEq  20 mEq Oral Daily White, Patrice L, NP   20 mEq at 12/06/22 4098   prazosin (MINIPRESS) capsule 2 mg  2 mg Oral QHS White, Patrice L, NP   2 mg at 12/05/22 2202   rosuvastatin (CRESTOR) tablet 40 mg  40 mg Oral Daily Massengill, Harrold Donath, MD   40 mg at 12/06/22 1191   torsemide (DEMADEX) tablet 60 mg  60 mg Oral Daily White, Patrice L, NP   60 mg at 12/06/22 4782   traZODone (DESYREL) tablet 100 mg  100 mg Oral QHS PRN Starleen Blue, NP        Lab Results:  Results for orders placed or performed during the hospital encounter of 12/04/22 (from the past 48 hour(s))  Glucose, capillary     Status: Abnormal   Collection Time: 12/04/22  9:23 PM   Result Value Ref Range   Glucose-Capillary 229 (H) 70 - 99 mg/dL    Comment: Glucose reference range applies only to samples taken after fasting for at least 8 hours.  Glucose, capillary     Status: Abnormal   Collection Time: 12/05/22  5:56 AM  Result Value Ref Range   Glucose-Capillary 138 (H) 70 - 99 mg/dL    Comment: Glucose reference range applies only to samples taken after fasting for at least 8 hours.  Glucose, capillary     Status: Abnormal   Collection Time: 12/05/22 12:00 PM  Result Value Ref Range   Glucose-Capillary 262 (H) 70 - 99 mg/dL    Comment: Glucose reference range applies only to samples taken after fasting for at least 8 hours.  Glucose, capillary     Status: Abnormal   Collection Time: 12/05/22  5:02 PM  Result Value Ref Range   Glucose-Capillary 296 (H) 70 - 99 mg/dL    Comment: Glucose reference range applies only to samples  taken after fasting for at least 8 hours.  Glucose, capillary     Status: Abnormal   Collection Time: 12/05/22  8:45 PM  Result Value Ref Range   Glucose-Capillary 323 (H) 70 - 99 mg/dL    Comment: Glucose reference range applies only to samples taken after fasting for at least 8 hours.  Glucose, capillary     Status: Abnormal   Collection Time: 12/06/22  5:44 AM  Result Value Ref Range   Glucose-Capillary 272 (H) 70 - 99 mg/dL    Comment: Glucose reference range applies only to samples taken after fasting for at least 8 hours.  Glucose, capillary     Status: Abnormal   Collection Time: 12/06/22 12:02 PM  Result Value Ref Range   Glucose-Capillary 312 (H) 70 - 99 mg/dL    Comment: Glucose reference range applies only to samples taken after fasting for at least 8 hours.   Comment 1 Notify RN     Blood Alcohol level:  Lab Results  Component Value Date   ETH <10 12/03/2022   ETH <10 10/25/2022    Metabolic Disorder Labs: Lab Results  Component Value Date   HGBA1C 9.2 (H) 10/05/2022   MPG 217.34 10/05/2022   MPG 214.47  02/26/2022   No results found for: "PROLACTIN" Lab Results  Component Value Date   CHOL 174 10/05/2022   TRIG 154 (H) 10/05/2022   HDL 46 10/05/2022   CHOLHDL 3.8 10/05/2022   VLDL 31 10/05/2022   LDLCALC 97 10/05/2022   LDLCALC UNABLE TO CALCULATE IF TRIGLYCERIDE OVER 400 mg/dL 16/03/9603    Physical Findings: AIMS:  , ,  ,  ,    CIWA:    COWS:     Musculoskeletal: Strength & Muscle Tone: within normal limits Gait & Station: normal Patient leans: N/A  Psychiatric Specialty Exam:  Presentation  General Appearance:  Disheveled  Eye Contact: Fleeting  Speech: Clear and Coherent  Speech Volume: Normal  Handedness: Right   Mood and Affect  Mood: Depressed; Anxious  Affect: Congruent   Thought Process  Thought Processes: Coherent  Descriptions of Associations:Intact  Orientation:Full (Time, Place and Person)  Thought Content:Logical  History of Schizophrenia/Schizoaffective disorder:No  Duration of Psychotic Symptoms:No data recorded Hallucinations:Hallucinations: None  Ideas of Reference:None  Suicidal Thoughts:Suicidal Thoughts: No  Homicidal Thoughts:Homicidal Thoughts: No   Sensorium  Memory: Immediate Good  Judgment: Fair  Insight: Fair   Art therapist  Concentration: Fair  Attention Span: Fair  Recall: [employer] of Knowledge: Fair  Language: Fair   Psychomotor Activity  Psychomotor Activity: Psychomotor Activity: Normal   Assets  Assets: Resilience   Sleep  Sleep: Sleep: Poor    Physical Exam: Physical Exam Constitutional:      Appearance: He is obese.  Eyes:     Pupils: Pupils are equal, round, and reactive to light.  Musculoskeletal:        General: Normal range of motion.     Cervical back: Normal range of motion.  Neurological:     Mental Status: He is alert and oriented to person, place, and time.    Review of Systems  Constitutional:  Negative for fever.  HENT:  Negative  for hearing loss.   Cardiovascular:  Positive for chest pain.  Musculoskeletal:  Positive for back pain, joint pain and myalgias.  Neurological:  Negative for dizziness.  Psychiatric/Behavioral:  Positive for depression and substance abuse. Negative for hallucinations, memory loss and suicidal ideas. The patient is nervous/anxious and has  insomnia.    Blood pressure 136/65, pulse 92, temperature 98.8 F (37.1 C), temperature source Oral, resp. rate 19, height 5\' 5"  (1.651 m), weight (!) 140 kg, SpO2 97 %. Body mass index is 51.36 kg/m.  Treatment Plan Summary: Daily contact with patient to assess and evaluate symptoms and progress in treatment and Medication management   Safety and Monitoring: Voluntary admission to inpatient psychiatric unit for safety, stabilization and treatment Daily contact with patient to assess and evaluate symptoms and progress in treatment Patient's case to be discussed in multi-disciplinary team meeting Observation Level : q15 minute checks Vital signs: q12 hours Precautions: Safety Patient may use CPAP machine nightly, when not being used CPAP machine must be in the med room.  Return machine to respiratory therapy at [facility] after patient is discharged.   Long Term Goal(s): Improvement in symptoms so as ready for discharge   Short Term Goals: Ability to identify changes in lifestyle to reduce recurrence of condition will improve, Ability to verbalize feelings will improve, Ability to disclose and discuss suicidal ideas, Ability to demonstrate self-control will improve, Ability to identify and develop effective coping behaviors will improve, Ability to maintain clinical measurements within normal limits will improve, Compliance with prescribed medications will improve, and Ability to identify triggers associated with substance abuse/mental health issues will improve   Diagnoses Principal Problem:   MDD (major depressive disorder), recurrent severe,  without psychosis (HCC) Active Problems:   HLD (hyperlipidemia)   Diabetes (HCC)   Hypertension   GERD (gastroesophageal reflux disease)   Cocaine use disorder, moderate, dependence (HCC)   Morbid obesity (HCC)   Non-ischemic cardiomyopathy (HCC)   Generalized anxiety disorder   Insomnia   Sleep apnea   Acute on chronic combined systolic and diastolic CHF (congestive heart failure) (HCC)   Biventricular ICD (implantable cardioverter-defibrillator) in place   History of stroke   Medications:  -Continue Abilify 5 mg daily for mood stabilization -Continue Gabapentin 300 mg 3 times daily for GAD/neuropathic pain -Continue Prazosin 2 mg nightly for PTSD related nightmares -Continue hydroxyzine 25 mg 3 times daily as needed for anxiety -Increase trazodone from 25 mg to 100 mg nightly as needed for sleep -Start Melatonin 5 mg nightly for sleep -Start Glucerna shakes TID for nutritional supplementation   Medications for medical reasons: (home meds) -Continue Coreg 3.125 mg twice daily with meals for CHF -Continue to Digoxin 0.125 mg daily for CHF -Continue rosuvastatin 40 mg daily for hyperlipidemia -Continue potassium chloride 20 mEq daily -Continue insulin NovoLog 0 to 15 mg as per sliding scale 3 times daily -Continue insulin Semglee 20 units nightly at bedtime for diabetes -Continue Plavix 75 daily for blood clot prophylaxis -Continue torsemide 60 mg daily for edema   Other PRNS -Continue Tylenol 650 mg every 6 hours PRN for mild pain -Continue Maalox 30 mg every 4 hrs PRN for indigestion -Continue Milk of Magnesia as needed every 6 hrs for constipation   TSH, lipid panel, and hemoglobin A1c last completed on 10/05/18/2024.  EKG with QTc prolonged at 498, will recheck in a 1-2 days, pt with pacer in place, asymptomatic with no chest pain.    Discharge Planning: Social work and case management to assist with discharge planning and identification of hospital follow-up needs prior  to discharge Estimated LOS: 5-7 days Discharge Concerns: Need to establish a safety plan; Medication compliance and effectiveness Discharge Goals: Return home with outpatient referrals for mental health follow-up including medication management/psychotherapy   I certify that  inpatient services furnished can reasonably be expected to improve the patient's condition.     Starleen Blue, NP 12/06/2022, 3:21 PM

## 2022-12-06 NOTE — Progress Notes (Addendum)
   12/06/22 1310  Vital Signs  Pulse Rate 92  Pulse Rate Source Dinamap  Resp 19  BP 136/65  BP Location Right Arm  BP Method Automatic  Patient Position (if appropriate) Lying  Oxygen Therapy  SpO2 97 %   Patient complained of chest pain after coming back from the cafeteria, even though he denied chest pain before going to the cafeteria. Provider notified, EKG was done per provider order, however, EKG result  appears normal, and vital signs within normal limit. No distress noted at this time, patient is in bed with his CPAP on his face. Nitroglycerin for chest pain ordered by provider. Staff will continue to provide support to patient.

## 2022-12-06 NOTE — Progress Notes (Signed)
   12/06/22 0559  15 Minute Checks  Location Bedroom  Visual Appearance Calm  Behavior Composed  Sleep (Behavioral Health Patients Only)  Calculate sleep? (Click Yes once per 24 hr at 0600 safety check) Yes  Documented sleep last 24 hours 9.25

## 2022-12-06 NOTE — ED Triage Notes (Signed)
Pt transferred to ED from [facility] with c/o chest pain that started around 2200. Pt reports sharp pain to left chest that radiates to neck and right arm. No ASA given due to allergy. Pt refused nitro from EMS.

## 2022-12-07 ENCOUNTER — Inpatient Hospital Stay (HOSPITAL_COMMUNITY): Payer: Non-veteran care

## 2022-12-07 DIAGNOSIS — F332 Major depressive disorder, recurrent severe without psychotic features: Secondary | ICD-10-CM

## 2022-12-07 LAB — DIGOXIN LEVEL: Digoxin Level: 0.3 ng/mL — ABNORMAL LOW (ref 0.8–2.0)

## 2022-12-07 LAB — COMPREHENSIVE METABOLIC PANEL
ALT: 18 U/L (ref 0–44)
AST: 15 U/L (ref 15–41)
Albumin: 3.3 g/dL — ABNORMAL LOW (ref 3.5–5.0)
Alkaline Phosphatase: 62 U/L (ref 38–126)
Anion gap: 12 (ref 5–15)
BUN: 16 mg/dL (ref 6–20)
CO2: 27 mmol/L (ref 22–32)
Calcium: 8.7 mg/dL — ABNORMAL LOW (ref 8.9–10.3)
Chloride: 96 mmol/L — ABNORMAL LOW (ref 98–111)
Creatinine, Ser: 0.97 mg/dL (ref 0.61–1.24)
GFR, Estimated: >60 mL/min (ref >60)
Glucose, Bld: 281 mg/dL — ABNORMAL HIGH (ref 70–99)
Potassium: 3.9 mmol/L (ref 3.5–5.1)
Sodium: 135 mmol/L (ref 135–145)
Total Bilirubin: 0.3 mg/dL (ref 0.3–1.2)
Total Protein: 6.2 g/dL — ABNORMAL LOW (ref 6.5–8.1)

## 2022-12-07 LAB — CBC WITH DIFFERENTIAL/PLATELET
Abs Immature Granulocytes: 0.03 10*3/uL (ref 0.00–0.07)
Basophils Absolute: 0 10*3/uL (ref 0.0–0.1)
Basophils Relative: 0 %
Eosinophils Absolute: 0.1 10*3/uL (ref 0.0–0.5)
Eosinophils Relative: 1 %
HCT: 44.6 % (ref 39.0–52.0)
Hemoglobin: 14.2 g/dL (ref 13.0–17.0)
Immature Granulocytes: 0 %
Lymphocytes Relative: 32 %
Lymphs Abs: 2.6 10*3/uL (ref 0.7–4.0)
MCH: 28.3 pg (ref 26.0–34.0)
MCHC: 31.8 g/dL (ref 30.0–36.0)
MCV: 89 fL (ref 80.0–100.0)
Monocytes Absolute: 0.9 10*3/uL (ref 0.1–1.0)
Monocytes Relative: 11 %
Neutro Abs: 4.5 10*3/uL (ref 1.7–7.7)
Neutrophils Relative %: 56 %
Platelets: 229 10*3/uL (ref 150–400)
RBC: 5.01 MIL/uL (ref 4.22–5.81)
RDW: 12.6 % (ref 11.5–15.5)
WBC: 8.1 10*3/uL (ref 4.0–10.5)
nRBC: 0 % (ref 0.0–0.2)

## 2022-12-07 LAB — CBG MONITORING, ED
Glucose-Capillary: 450 mg/dL — ABNORMAL HIGH (ref 70–99)
Glucose-Capillary: 395 mg/dL — ABNORMAL HIGH (ref 70–99)
Glucose-Capillary: 371 mg/dL — ABNORMAL HIGH (ref 70–99)

## 2022-12-07 LAB — CK TOTAL AND CKMB (NOT AT ARMC)
CK, MB: 2.9 ng/mL (ref 0.5–5.0)
Total CK: 84 U/L (ref 49–397)

## 2022-12-07 LAB — TROPONIN I (HIGH SENSITIVITY)
Troponin I (High Sensitivity): 20 ng/L — ABNORMAL HIGH (ref <18)
Troponin I (High Sensitivity): 18 ng/L — ABNORMAL HIGH (ref <18)
Troponin I (High Sensitivity): 20 ng/L — ABNORMAL HIGH (ref <18)
Troponin I (High Sensitivity): 20 ng/L — ABNORMAL HIGH (ref <18)

## 2022-12-07 LAB — GLUCOSE, CAPILLARY: Glucose-Capillary: 307 mg/dL — ABNORMAL HIGH (ref 70–99)

## 2022-12-07 LAB — LIPASE, BLOOD: Lipase: 91 U/L — ABNORMAL HIGH (ref 11–51)

## 2022-12-07 LAB — D-DIMER, QUANTITATIVE: D-Dimer, Quant: 0.38 ug/mL-FEU (ref 0.00–0.50)

## 2022-12-07 MED ORDER — DIPHENHYDRAMINE HCL 25 MG PO CAPS: 50.0000 mg | ORAL_CAPSULE | Freq: Once | ORAL | Status: AC

## 2022-12-07 MED ORDER — DIPHENHYDRAMINE HCL 50 MG/ML IJ SOLN
50.0000 mg | Freq: Once | INTRAMUSCULAR | Status: AC
  Administered 2022-12-07: 50 mg via INTRAVENOUS
  Filled 2022-12-07: qty 1

## 2022-12-07 MED ORDER — GABAPENTIN 300 MG PO CAPS
300.0000 mg | ORAL_CAPSULE | Freq: Three times a day (TID) | ORAL | Status: DC
  Administered 2022-12-08: 300 mg via ORAL
  Filled 2022-12-07: qty 1

## 2022-12-07 MED ORDER — METHYLPREDNISOLONE SODIUM SUCC 40 MG IJ SOLR
40.0000 mg | Freq: Once | INTRAMUSCULAR | Status: AC
  Administered 2022-12-07: 40 mg via INTRAVENOUS
  Filled 2022-12-07: qty 1

## 2022-12-07 MED ORDER — IOHEXOL 350 MG/ML SOLN
100.0000 mL | Freq: Once | INTRAVENOUS | Status: AC | PRN
  Administered 2022-12-07: 100 mL via INTRAVENOUS

## 2022-12-07 MED ORDER — MORPHINE SULFATE (PF) 4 MG/ML IV SOLN
4.0000 mg | Freq: Once | INTRAVENOUS | Status: AC
  Administered 2022-12-07: 4 mg via INTRAVENOUS
  Filled 2022-12-07: qty 1

## 2022-12-07 NOTE — ED Provider Notes (Addendum)
  Physical Exam  BP 139/63   Pulse 92   Temp 98 F (36.7 C) (Oral)   Resp (!) 25   Ht 5\' 5"  (1.651 m)   Wt (!) 138.3 kg   SpO2 97%   BMI 50.75 kg/m     Procedures  Procedures  ED Course / MDM    Medical Decision Making Amount and/or Complexity of Data Reviewed Labs: ordered. Radiology: ordered. ECG/medicine tests: ordered.  Risk OTC drugs. Prescription drug management. Decision regarding hospitalization.   71M, presenting with CP radiating to the back, troponins flat, dimer negative, contrast allergy pending scan/CTA, likely admit back to [facility]  CTA Negative: IMPRESSION:  1. No evidence of aortic aneurysm, dissection or other acute  vascular pathology.  2. Cardiomegaly with left subclavian approach biventricular cardiac  rhythm maintenance device in place.  3. Hepatic steatosis with cirrhotic morphologic changes. Suspect  hepatic cirrhosis.  4. Aortic and coronary artery atherosclerotic vascular  calcifications.    Troponins flat at 20-18-20. Overall reassuring workup, stable for admission back to [facility].   10:26AM Update: Pt with a hx of chronic hypoxic respiratory failure, states he is chronically on O2 at home 2.5L/min. [facility] is not able to assist with O2, would need to admit to [facility] with psychiatry following if patient meets inpatient criteria.  The patient was unwilling to contract for safety upon the evaluation by TTS. Recommendations included continued monitoring in the ER and reassessment in the AM.     Ernie Avena, MD 12/07/22 1101

## 2022-12-07 NOTE — Progress Notes (Signed)
   12/07/22 2100  BiPAP/CPAP/SIPAP  $ Non-Invasive Home Ventilator  Initial  $ Face Mask Large  Yes  BiPAP/CPAP/SIPAP Pt Type Adult  BiPAP/CPAP/SIPAP DREAMSTATIOND  Mask Type Full face mask  Mask Size Medium  Respiratory Rate 18 breaths/min  EPAP 11 cmH2O  FiO2 (%) 21 %  Patient Home Equipment No  Auto Titrate No  BiPAP/CPAP /SiPAP Vitals  Pulse Rate 89  Resp 18  SpO2 94 %  Bilateral Breath Sounds Clear;Diminished

## 2022-12-07 NOTE — Consult Note (Signed)
BH ED ASSESSMENT   Reason for Consult:  SI, [facility] follow up Referring Physician:  Karene Fry Patient Identification: Luis [name] MRN:  [id number] ED Chief Complaint: MDD (major depressive disorder), recurrent severe, without psychosis (HCC)  Diagnosis:  Principal Problem:   MDD (major depressive disorder), recurrent severe, without psychosis (HCC) Active Problems:   HLD (hyperlipidemia)   Diabetes (HCC)   Hypertension   GERD (gastroesophageal reflux disease)   Cocaine use disorder, moderate, dependence (HCC)   Morbid obesity (HCC)   Non-ischemic cardiomyopathy (HCC)   Generalized anxiety disorder   Insomnia   Sleep apnea   Acute on chronic combined systolic and diastolic CHF (congestive heart failure) (HCC)   Biventricular ICD (implantable cardioverter-defibrillator) in place   History of stroke   ED Assessment Time Calculation: Start Time: 1000 Stop Time: 1045 Total Time in Minutes (Assessment Completion): 45   HPI:   Luis [name] is a 52 y.o. male patient who originally presented to [facility] with complaints of suicidal ideations. Pt was transferred to [facility] for inpatient psychiatric treatment. However Pt started having chest pain and was sent to [facility] for medical clearance. Pt has a pacemaker, his troponins were elevated, however Pt outpatient cardiologist stated he had no acute concerns, and likely troponin was elevated due to his recent cocaine use. Pt has now been medically cleared from [facility] and was set to return to [facility]. However, patient disclosed he is suppose to be on continuous O2, and has nasal cannula support at home. Pt is mildly hypoxic, O2 around 92% when ambulating, complaints of dizziness. [facility] was unaware of this medical condition/need.   Due to this, patient can not return to [facility], this is medically exclusionary. TTS was consulted to reevaluate patient to determine need for further inpatient treatment or if improved enough to return home since he did receive a  few days in the inpatient setting.   Subjective:   Pt seen at [facility] for face to face psychiatric evaluation. Pt is irritable, he was told he can not return to [facility] and states "just send me home. Yall don't want to help me and just want to kick me out so just send me home." Tried to calm patient down to have discussion about treatment options. Ultimately patient is frustrated, especially since his belongings are still at [facility]. He is afraid of being discharged and not having his things - I explained this would not be the case.   Ultimately, pt states he didn't feel ready to go home quite yet. While he was initially angry at the start of assessment he stated he might hurt himself. Denied any plan or intent. However, as we continued to speak and he continued to calm down, he explained he doesn't want to hurt himself and just feels like the hospital is trying to kick him out without his belongings.   He is frustrated about his current situation and that he can not return to [facility]. He stated "if I am going to sit in the hallway then just send me home." I explained to the patient I would prefer he stay the night to continue to calm down and be more rationale, and we can not promise he wont be moved to a hallway bed. After discussion, patient is agreeable to stay the night, and be reevaluated in the morning for possible discharge if he continues to feel safe with no suicidal ideations.   Pt denies HI. Denies AVH. Pt has had cocaine use. He follows up with the [place]  in [place]. He reports previously getting ECT around 1 year ago and feels like that is helpful. [facility]. He lives alone, denies having a lot of family or friend support.   Past Psychiatric History:  MDD, substance abuse   Risk to Self or Others: Is the patient at risk to self? Yes Has the patient been a risk to self in the past 6 months? No Has the patient been a risk to self within the distant  past? Yes Is the patient a risk to others? No Has the patient been a risk to others in the past 6 months? No Has the patient been a risk to others within the distant past? No  [place] Scale:  Flowsheet Row ED from 12/04/2022 in [facility] Emergency Department at [facility] Most recent reading at 12/06/2022 11:20 PM ED from 12/03/2022 in [facility] Emergency Department at [facility] Most recent reading at 12/03/2022  2:32 PM ED from 12/03/2022 in [facility] Most recent reading at 12/03/2022  1:05 PM  C-SSRS RISK CATEGORY High Risk No Risk High Risk       Past Medical History:  Past Medical History:  Diagnosis Date   Anxiety    Biventricular ICD (implantable cardioverter-defibrillator) in place    Chronic systolic CHF (congestive heart failure) (HCC)    Cocaine use    Diabetes mellitus without complication (HCC)    GERD (gastroesophageal reflux disease)    HLD (hyperlipidemia)    Hypertension    Morbid obesity (HCC)    NICM (nonischemic cardiomyopathy) (HCC)    OSA (obstructive sleep apnea)    PTSD (post-traumatic stress disorder)    on Depakote   Refusal of blood transfusions as patient is Jehovah's Witness    Tobacco abuse     Past Surgical History:  Procedure Laterality Date   BIV ICD INSERTION CRT-D     FOOT FRACTURE SURGERY     ICD GENERATOR CHANGEOUT N/A 03/23/2022   Procedure: ICD GENERATOR CHANGEOUT;  Surgeon: Nelly Laurence, Roberts Gaudy, MD;  Location: [facility] INVASIVE CV LAB;  Service: Cardiovascular;  Laterality: N/A;   RIGHT HEART CATH N/A 03/06/2022   Procedure: RIGHT HEART CATH;  Surgeon: Orbie Pyo, MD;  Location: [facility] INVASIVE CV LAB;  Service: Cardiovascular;  Laterality: N/A;   RIGHT/LEFT HEART CATH AND CORONARY ANGIOGRAPHY N/A 04/16/2022   Procedure: RIGHT/LEFT HEART CATH AND CORONARY ANGIOGRAPHY;  Surgeon: Laurey Morale, MD;  Location: [facility] INVASIVE CV LAB;  Service: Cardiovascular;  Laterality: N/A;   ROTATOR CUFF REPAIR      TONSILLECTOMY     Family History:  Family History  Problem Relation Age of Onset   Hypertension Mother    Bone cancer Father    Lung cancer Father    Social History:  Social History   Substance and Sexual Activity  Alcohol Use Not Currently     Social History   Substance and Sexual Activity  Drug Use Yes   Types: Cocaine, Marijuana   Comment: using in binges, 1/4gm daily x 2 weeks.    Social History   Socioeconomic History   Marital status: Widowed    Spouse name: Not on file   Number of children: 7   Years of education: Not on file   Highest education level: Bachelor's degree (e.g., BA, AB, BS)  Occupational History   Occupation: disability  Tobacco Use   Smoking status: Every Day  Packs/day: 0.50    Years: 15.00    Additional pack years: 0.00    Total pack years: 7.50    Types: Cigarettes    Last attempt to quit: 10/2021    Years since quitting: 1.1   Smokeless tobacco: Never   Tobacco comments:    occasionally  Vaping Use   Vaping Use: Never used  Substance and Sexual Activity   Alcohol use: Not Currently   Drug use: Yes    Types: Cocaine, Marijuana    Comment: using in binges, 1/4gm daily x 2 weeks.   Sexual activity: Not Currently  Other Topics Concern   Not on file  Social History Narrative   Not on file   Social Determinants of Health   Financial Resource Strain: High Risk (11/16/2022)   Overall Financial Resource Strain (CARDIA)    Difficulty of Paying Living Expenses: Hard  Food Insecurity: Food Insecurity Present (12/04/2022)   Hunger Vital Sign    Worried About Running Out of Food in the Last Year: Sometimes true    Ran Out of Food in the Last Year: Sometimes true  Transportation Needs: No Transportation Needs (12/04/2022)   PRAPARE - Administrator, Civil Service (Medical): No    Lack of Transportation (Non-Medical): No  Recent Concern: Transportation Needs - Unmet Transportation Needs (11/16/2022)   PRAPARE - Therapist, art (Medical): No    Lack of Transportation (Non-Medical): Yes  Physical Activity: Not on file  Stress: Not on file  Social Connections: Not on file    Allergies:   Allergies  Allergen Reactions   Aspirin Anaphylaxis and Other (See Comments)    Throat closing    Iodine-131 Hives   Peanut (Diagnostic) Anaphylaxis    Throat closes   Iodinated Contrast Media Hives    Can be premedicated    Latex Hives and Rash   Penicillins Nausea And Vomiting   Ultram [Tramadol] Hives and Nausea Only   Zestril [Lisinopril] Cough   Cinnamon    Amoxicillin-Pot Clavulanate Diarrhea   Lidocaine Rash    Rash from adhesive on lidocaine patches     Labs:  Results for orders placed or performed during the hospital encounter of 12/04/22 (from the past 48 hour(s))  Glucose, capillary     Status: Abnormal   Collection Time: 12/05/22 12:00 PM  Result Value Ref Range   Glucose-Capillary 262 (H) 70 - 99 mg/dL    Comment: Glucose reference range applies only to samples taken after fasting for at least 8 hours.  Glucose, capillary     Status: Abnormal   Collection Time: 12/05/22  5:02 PM  Result Value Ref Range   Glucose-Capillary 296 (H) 70 - 99 mg/dL    Comment: Glucose reference range applies only to samples taken after fasting for at least 8 hours.  Glucose, capillary     Status: Abnormal   Collection Time: 12/05/22  8:45 PM  Result Value Ref Range   Glucose-Capillary 323 (H) 70 - 99 mg/dL    Comment: Glucose reference range applies only to samples taken after fasting for at least 8 hours.  Glucose, capillary     Status: Abnormal   Collection Time: 12/06/22  5:44 AM  Result Value Ref Range   Glucose-Capillary 272 (H) 70 - 99 mg/dL    Comment: Glucose reference range applies only to samples taken after fasting for at least 8 hours.  Glucose, capillary     Status: Abnormal   Collection  Time: 12/06/22 12:02 PM  Result Value Ref Range   Glucose-Capillary 312 (H) 70 - 99 mg/dL     Comment: Glucose reference range applies only to samples taken after fasting for at least 8 hours.   Comment 1 Notify RN   Glucose, capillary     Status: Abnormal   Collection Time: 12/06/22  4:46 PM  Result Value Ref Range   Glucose-Capillary 311 (H) 70 - 99 mg/dL    Comment: Glucose reference range applies only to samples taken after fasting for at least 8 hours.  Glucose, capillary     Status: Abnormal   Collection Time: 12/06/22  8:44 PM  Result Value Ref Range   Glucose-Capillary 314 (H) 70 - 99 mg/dL    Comment: Glucose reference range applies only to samples taken after fasting for at least 8 hours.   Comment 1 Notify RN    Comment 2 Document in Chart   CBC with Differential     Status: None   Collection Time: 12/06/22 11:49 PM  Result Value Ref Range   WBC 8.1 4.0 - 10.5 K/uL   RBC 5.01 4.22 - 5.81 MIL/uL   Hemoglobin 14.2 13.0 - 17.0 g/dL   HCT 16.1 09.6 - 04.5 %   MCV 89.0 80.0 - 100.0 fL   MCH 28.3 26.0 - 34.0 pg   MCHC 31.8 30.0 - 36.0 g/dL   RDW 40.9 81.1 - 91.4 %   Platelets 229 150 - 400 K/uL   nRBC 0.0 0.0 - 0.2 %   Neutrophils Relative % 56 %   Neutro Abs 4.5 1.7 - 7.7 K/uL   Lymphocytes Relative 32 %   Lymphs Abs 2.6 0.7 - 4.0 K/uL   Monocytes Relative 11 %   Monocytes Absolute 0.9 0.1 - 1.0 K/uL   Eosinophils Relative 1 %   Eosinophils Absolute 0.1 0.0 - 0.5 K/uL   Basophils Relative 0 %   Basophils Absolute 0.0 0.0 - 0.1 K/uL   Immature Granulocytes 0 %   Abs Immature Granulocytes 0.03 0.00 - 0.07 K/uL    Comment: Performed at [facility], [address] [place], [place] [address]  Comprehensive metabolic panel     Status: Abnormal   Collection Time: 12/06/22 11:49 PM  Result Value Ref Range   Sodium 135 135 - 145 mmol/L   Potassium 3.9 3.5 - 5.1 mmol/L   Chloride 96 (L) 98 - 111 mmol/L   CO2 27 22 - 32 mmol/L   Glucose, Bld 281 (H) 70 - 99 mg/dL    Comment: Glucose reference range applies only to samples taken after fasting for at least 8  hours.   BUN 16 6 - 20 mg/dL   Creatinine, Ser 6.21 0.61 - 1.24 mg/dL   Calcium 8.7 (L) 8.9 - 10.3 mg/dL   Total Protein 6.2 (L) 6.5 - 8.1 g/dL   Albumin 3.3 (L) 3.5 - 5.0 g/dL   AST 15 15 - 41 U/L   ALT 18 0 - 44 U/L   Alkaline Phosphatase 62 38 - 126 U/L   Total Bilirubin 0.3 0.3 - 1.2 mg/dL   GFR, Estimated >30 >86 mL/min    Comment: (NOTE) Calculated using the CKD-EPI Creatinine Equation (2021)    Anion gap 12 5 - 15    Comment: Performed at [facility], [address], [place], [place] [address]  Lipase, blood     Status: Abnormal   Collection Time: 12/06/22 11:49 PM  Result Value Ref  Range   Lipase 91 (H) 11 - 51 U/L    Comment: Performed at [facility], [address] [place], [place] [address]  Troponin I (High Sensitivity)     Status: Abnormal   Collection Time: 12/06/22 11:49 PM  Result Value Ref Range   Troponin I (High Sensitivity) 20 (H) <18 ng/L    Comment: (NOTE) Elevated high sensitivity troponin I (hsTnI) values and significant  changes across serial measurements may suggest ACS but many other  chronic and acute conditions are known to elevate hsTnI results.  Refer to the "Links" section for chest pain algorithms and additional  guidance. Performed at [facility], [address], [place], [place] [address]   D-dimer, quantitative     Status: None   Collection Time: 12/06/22 11:49 PM  Result Value Ref Range   D-Dimer, Quant 0.38 0.00 - 0.50 ug/mL-FEU    Comment: (NOTE) At the manufacturer cut-off value of 0.5 g/mL FEU, this assay has a negative predictive value of 95-100%.This assay is intended for use in conjunction with a clinical pretest probability (PTP) assessment model to exclude pulmonary embolism (PE) and deep venous thrombosis (DVT) in outpatients suspected of PE or DVT. Results should be correlated with clinical presentation. Performed at [facility], [address] [place], [place] [address]   CK total and CKMB  (cardiac)not at [facility]     Status: None   Collection Time: 12/06/22 11:49 PM  Result Value Ref Range   Total CK 84 49 - 397 U/L   CK, MB 2.9 0.5 - 5.0 ng/mL    Comment: Performed at [facility], [address], [place], [place] [address]  Troponin I (High Sensitivity)     Status: Abnormal   Collection Time: 12/07/22  1:50 AM  Result Value Ref Range   Troponin I (High Sensitivity) 18 (H) <18 ng/L    Comment: (NOTE) Elevated high sensitivity troponin I (hsTnI) values and significant  changes across serial measurements may suggest ACS but many other  chronic and acute conditions are known to elevate hsTnI results.  Refer to the "Links" section for chest pain algorithms and additional  guidance. Performed at [facility], [address], [place], [place] [address]   Troponin I (High Sensitivity)     Status: Abnormal   Collection Time: 12/07/22  6:50 AM  Result Value Ref Range   Troponin I (High Sensitivity) 20 (H) <18 ng/L    Comment: (NOTE) Elevated high sensitivity troponin I (hsTnI) values and significant  changes across serial measurements may suggest ACS but many other  chronic and acute conditions are known to elevate hsTnI results.  Refer to the "Links" section for chest pain algorithms and additional  guidance. Performed at [facility], [address], [place], [place] [address]   Digoxin level     Status: Abnormal   Collection Time: 12/07/22  6:50 AM  Result Value Ref Range   Digoxin Level 0.3 (L) 0.8 - 2.0 ng/mL    Comment: RESULTS VERIFIED BY REPEAT TESTING Performed at [facility], [address], [place], [place] [address]   Glucose, capillary     Status: Abnormal   Collection Time: 12/07/22  6:50 AM  Result Value Ref Range   Glucose-Capillary 307 (H) 70 - 99 mg/dL    Comment: Glucose reference range applies only to samples taken after fasting for at least 8 hours.  Troponin I (High Sensitivity)  Status: Abnormal   Collection Time:  12/07/22  9:28 AM  Result Value Ref Range   Troponin I (High Sensitivity) 20 (H) <18 ng/L    Comment: (NOTE) Elevated high sensitivity troponin I (hsTnI) values and significant  changes across serial measurements may suggest ACS but many other  chronic and acute conditions are known to elevate hsTnI results.  Refer to the "Links" section for chest pain algorithms and additional  guidance. Performed at [facility], [address], [place], [place] [address]     Current Facility-Administered Medications  Medication Dose Route Frequency Provider Last Rate Last Admin   acetaminophen (TYLENOL) tablet 650 mg  650 mg Oral Q6H PRN White, Patrice L, NP   650 mg at 12/07/22 0657   albuterol (VENTOLIN HFA) 108 (90 Base) MCG/ACT inhaler 2 puff  2 puff Inhalation Q4H PRN Starleen Blue, NP   2 puff at 12/06/22 2139   alum & mag hydroxide-simeth (MAALOX/MYLANTA) 200-200-20 MG/5ML suspension 30 mL  30 mL Oral Q4H PRN White, Patrice L, NP       ARIPiprazole (ABILIFY) tablet 5 mg  5 mg Oral Daily White, Patrice L, NP   5 mg at 12/07/22 0724   carvedilol (COREG) tablet 3.125 mg  3.125 mg Oral BID WC White, Patrice L, NP   3.125 mg at 12/07/22 0724   clopidogrel (PLAVIX) tablet 75 mg  75 mg Oral Daily White, Patrice L, NP   75 mg at 12/07/22 0724   dapagliflozin propanediol (FARXIGA) tablet 10 mg  10 mg Oral Daily White, Patrice L, NP   10 mg at 12/07/22 0724   digoxin (LANOXIN) tablet 0.125 mg  0.125 mg Oral Daily White, Patrice L, NP   0.125 mg at 12/07/22 0724   diphenhydrAMINE (BENADRYL) capsule 50 mg  50 mg Oral TID PRN White, Patrice L, NP       Or   diphenhydrAMINE (BENADRYL) injection 50 mg  50 mg Intramuscular TID PRN White, Patrice L, NP       feeding supplement (GLUCERNA SHAKE) (GLUCERNA SHAKE) liquid 237 mL  237 mL Oral TID BM Nkwenti, Doris, NP   237 mL at 12/06/22 1403   gabapentin (NEURONTIN) capsule 300 mg  300 mg Oral TID White, Patrice L, NP   300 mg at 12/07/22 6045   haloperidol  (HALDOL) tablet 5 mg  5 mg Oral TID PRN White, Patrice L, NP       Or   haloperidol lactate (HALDOL) injection 5 mg  5 mg Intramuscular TID PRN White, Patrice L, NP       hydrOXYzine (ATARAX) tablet 25 mg  25 mg Oral TID PRN White, Patrice L, NP   25 mg at 12/06/22 2111   insulin aspart (novoLOG) injection 0-15 Units  0-15 Units Subcutaneous TID WC Bobbitt, Shalon E, NP   11 Units at 12/07/22 0657   insulin aspart (novoLOG) injection 0-5 Units  0-5 Units Subcutaneous QHS Bobbitt, Shalon E, NP   4 Units at 12/06/22 2114   insulin glargine-yfgn (SEMGLEE) injection 20 Units  20 Units Subcutaneous QHS White, Patrice L, NP   20 Units at 12/06/22 2111   LORazepam (ATIVAN) tablet 2 mg  2 mg Oral TID PRN White, Patrice L, NP       Or   LORazepam (ATIVAN) injection 2 mg  2 mg Intramuscular TID PRN White, Patrice L, NP       magnesium hydroxide (MILK OF MAGNESIA) suspension 30 mL  30 mL Oral Daily  PRN Liborio Nixon L, NP       melatonin tablet 5 mg  5 mg Oral QHS Nkwenti, Doris, NP   5 mg at 12/06/22 2112   potassium chloride SA (KLOR-CON M) CR tablet 20 mEq  20 mEq Oral Daily White, Patrice L, NP   20 mEq at 12/07/22 0724   prazosin (MINIPRESS) capsule 2 mg  2 mg Oral QHS White, Patrice L, NP   2 mg at 12/06/22 2111   rosuvastatin (CRESTOR) tablet 40 mg  40 mg Oral Daily Massengill, Harrold Donath, MD   40 mg at 12/07/22 0724   torsemide (DEMADEX) tablet 60 mg  60 mg Oral Daily White, Patrice L, NP   60 mg at 12/07/22 7829   traZODone (DESYREL) tablet 100 mg  100 mg Oral QHS PRN Starleen Blue, NP   100 mg at 12/06/22 2111   Current Outpatient Medications  Medication Sig Dispense Refill   acetaminophen (TYLENOL) 325 MG tablet Take 2 tablets (650 mg total) by mouth every 6 (six) hours as needed for mild pain.     ARIPiprazole (ABILIFY) 5 MG tablet Take 1 tablet (5 mg total) by mouth daily. 30 tablet 0   carvedilol (COREG) 3.125 MG tablet Take 3.125 mg by mouth 2 (two) times daily with a meal.     clopidogrel  (PLAVIX) 75 MG tablet Take 1 tablet (75 mg total) by mouth daily. 30 tablet 0   dapagliflozin propanediol (FARXIGA) 10 MG TABS tablet Take 1 tablet (10 mg total) by mouth daily. 90 tablet 2   digoxin (LANOXIN) 0.125 MG tablet Take 0.125 mg by mouth daily.     DULoxetine (CYMBALTA) 30 MG capsule Take 3 capsules (90 mg total) by mouth daily. 90 capsule 0   gabapentin (NEURONTIN) 300 MG capsule Take 1 capsule (300 mg total) by mouth 3 (three) times daily. 90 capsule 0   hydrOXYzine (ATARAX) 25 MG tablet Take 1 tablet (25 mg total) by mouth 3 (three) times daily as needed for anxiety. 75 tablet 1   insulin aspart protamine- aspart (NOVOLOG MIX 70/30) (70-30) 100 UNIT/ML injection Inject 0.7 mLs (70 Units total) into the skin 2 (two) times daily with breakfast and lunch. 42 mL 0   insulin glargine-yfgn (SEMGLEE) 100 UNIT/ML injection Inject 0.2 mLs (20 Units total) into the skin at bedtime. 10 mL 11   potassium chloride SA (KLOR-CON M) 20 MEQ tablet Take 20 mEq by mouth daily.     prazosin (MINIPRESS) 2 MG capsule Take 1 capsule (2 mg total) by mouth at bedtime. 30 capsule 1   rosuvastatin (CRESTOR) 40 MG tablet Take 1 tablet (40 mg total) by mouth daily. 30 tablet 0   sacubitril-valsartan (ENTRESTO) 97-103 MG Take 1 tablet by mouth 2 (two) times daily. 60 tablet    spironolactone (ALDACTONE) 25 MG tablet Take 1 tablet (25 mg total) by mouth daily.     torsemide (DEMADEX) 20 MG tablet Take 3 tablets (60 mg total) by mouth daily. 270 tablet 2   traZODone (DESYREL) 300 MG tablet Take 1 tablet (300 mg total) by mouth at bedtime as needed for sleep. 30 tablet 0    Psychiatric Specialty Exam: Presentation  General Appearance:  Fairly Groomed  Eye Contact: Fair  Speech: Clear and Coherent  Speech Volume: Normal  Handedness: Right   Mood and Affect  Mood: Depressed; Irritable  Affect: Congruent   Thought Process  Thought Processes: Coherent  Descriptions of  Associations:Intact  Orientation:Full (Time, Place and Person)  Thought  Content:Logical  History of Schizophrenia/Schizoaffective disorder:No  Duration of Psychotic Symptoms:No data recorded Hallucinations:Hallucinations: None  Ideas of Reference:None  Suicidal Thoughts:Suicidal Thoughts: Yes, Active SI Active Intent and/or Plan: -- (will not disclose any plans or intent)  Homicidal Thoughts:Homicidal Thoughts: No   Sensorium  Memory: Immediate Fair; Recent Fair  Judgment: Fair  Insight: Fair   Art therapist  Concentration: Good  Attention Span: Good  Recall: Good  Fund of Knowledge: Good  Language: Good   Psychomotor Activity  Psychomotor Activity: Psychomotor Activity: Normal   Assets  Assets: Desire for Improvement; Physical Health; Resilience; Social Support    Sleep  Sleep: Sleep: Fair   Physical Exam: Physical Exam Neurological:     Mental Status: He is alert and oriented to person, place, and time.  Psychiatric:        Attention and Perception: Attention normal.        Mood and Affect: Affect is angry.        Speech: Speech normal.        Behavior: Behavior is cooperative.        Thought Content: Thought content includes suicidal ideation.    Review of Systems  Psychiatric/Behavioral:  Positive for depression, substance abuse and suicidal ideas.   All other systems reviewed and are negative.  Blood pressure 130/75, pulse 94, temperature 98 F (36.7 C), temperature source Oral, resp. rate (!) 22, height 5\' 5"  (1.651 m), weight (!) 138.3 kg, SpO2 92 %. Body mass index is 50.75 kg/m.  Medical Decision Making: Pt case reviewed and discussed with Dr. Lucianne Muss and Dr. Karene Fry. Due to unforseen circumstances, will recommend overnight observation with reevaluation in the morning. Would prefer patient to remain in ED overnight, [facility] technician is going to retrieve his belongings and bring them to the ED by this evening. Will reassess  patient in the morning to verify stabilization, no safety concerns, and possible return home.   - continue current medications.   Disposition:  overnight observation, with reevaluation by psychiatry tomorrow  Eligha Bridegroom, NP 12/07/2022 10:55 AM

## 2022-12-07 NOTE — ED Notes (Signed)
Pt placed back on cardiac and VS monitoring. Pt instructed that this equipment is important for his care moving forward and that it needs to be worn.

## 2022-12-07 NOTE — ED Notes (Signed)
Received report from [employer]. Since device was last interrogated 10/26/22, pt has had 22 episodes of SVT. Most recent SVT was on 11/25/22. Pt often has a HR > 98. Pacing captures 96% of the time.

## 2022-12-07 NOTE — ED Notes (Signed)
IVC Paperwork Completed. EXP: 12/14/22 1 copy labeled and filed in medrec drawer, 1 copy faxed to [facility], 3 copies on clip board in purple zone with pt. Original in red magistrate folder behind triage. CASE NUMBER: [id number]

## 2022-12-07 NOTE — ED Notes (Signed)
Patients belongings in purple zone-LOCKER # 10

## 2022-12-07 NOTE — ED Notes (Signed)
Pt blood sugar at 1750 was 450 and Dr Wilkie Aye was made aware he received Novolog 15 units

## 2022-12-07 NOTE — ED Notes (Signed)
CBG checked per pt request. MD aware. Will recheck CBG for insulin administration at 2200

## 2022-12-07 NOTE — ED Notes (Signed)
Called [facility] for hand off report

## 2022-12-07 NOTE — Consult Note (Signed)
Brief Psychiatry Consult Note  Was briefly consulted for this ED pt. Ensured continuity with TTS team and [facility] AC. Discontinuing consult. NC1.   Luis [name] A Kollins Fenter

## 2022-12-07 NOTE — Progress Notes (Signed)
Pt was accepted to Return back to [facility] TODAY  12/07/2022; Pt is medically clear to return to [facility] from previous admission at [facility].  Pt meets inpatient criteria per Madelin Rear  Attending Physician will be Dr. Phineas Inches, MD   Report can be called to: -Adult unit: [phone]  Pt can arrive after: BED IS READY per [facility].  Care Team notified: April Smith, RN, Ernie Avena, MD, Madelin Rear,  Day [facility] Rona Ravens, RN, Margaretha Seeds, Phebe Colla, NP   Kelton Pillar, LCSWA 12/07/2022 @ 9:50 AM

## 2022-12-07 NOTE — Inpatient Diabetes Management (Addendum)
Inpatient Diabetes Program Recommendations  AACE/ADA: New Consensus Statement on Inpatient Glycemic Control (2015)  Target Ranges:  Prepandial:   less than 140 mg/dL      Peak postprandial:   less than 180 mg/dL (1-2 hours)      Critically ill patients:  140 - 180 mg/dL   Lab Results  Component Value Date   GLUCAP 307 (H) 12/07/2022   HGBA1C 9.2 (H) 10/05/2022    Review of Glycemic Control  Latest Reference Range & Units 12/06/22 05:44 12/06/22 12:02 12/06/22 16:46 12/06/22 20:44 12/07/22 06:50  Glucose-Capillary 70 - 99 mg/dL 865 (H) 784 (H) 696 (H) 314 (H) 307 (H)  (H): Data is abnormally high  Diabetes history: DM2 Outpatient Diabetes medications: 70/30 70 units BID, Semglee 20 units QD Current orders for Inpatient glycemic control: Semglee 20 units QD, Farxiga 10 units QD, Novolog 0-15 units TID and 0-5 units QHS, received Solumedrol 40 mg yesterday  Inpatient Diabetes Program Recommendations:    Please consider: Semglee 20 units BID  Will continue to follow while inpatient.  Thank you, Dulce Sellar, MSN, CDCES Diabetes Coordinator Inpatient Diabetes Program [phone] (team pager from 8a-5p)

## 2022-12-07 NOTE — Progress Notes (Addendum)
Transfer to [facility] has been pt on HOLD for further medical consult due to pt's hx of of chronic hypoxic respiratory failure on 2.5L O2 at home per report Ernie Avena, MD.   This CSW/ Disposition team is in communication with care team. 02 is exclusionary criteria for admission to [facility].  Identified barrier for Inpatient Behavioral Health Placement: O2 needs.  Psych Providers Madelin Rear and Phebe Colla, NP will re-assess pt and follow up with care team.   Maryjean Ka, MSW, [facility] 12/07/2022 10:29 AM

## 2022-12-08 LAB — TROPONIN T: Troponin T (Highly Sensitive): 27 ng/L — ABNORMAL HIGH (ref 0–22)

## 2022-12-08 LAB — CBG MONITORING, ED: Glucose-Capillary: 251 mg/dL — ABNORMAL HIGH (ref 70–99)

## 2022-12-08 NOTE — ED Notes (Signed)
Pt sleeping comfortably. Vital signs deferred to promote pt comfort and rest

## 2022-12-08 NOTE — ED Notes (Addendum)
Pt belongings in Locker #1

## 2022-12-08 NOTE — Discharge Summary (Signed)
[name] Discharge  12/08/2022 8:47 AM Luis [name]  MRN:  [id number]  Principal Problem: MDD (major depressive disorder), recurrent severe, without psychosis (HCC) Discharge Diagnoses: Principal Problem:   MDD (major depressive disorder), recurrent severe, without psychosis (HCC) Active Problems:   HLD (hyperlipidemia)   Diabetes (HCC)   Hypertension   GERD (gastroesophageal reflux disease)   Cocaine use disorder, moderate, dependence (HCC)   Morbid obesity (HCC)   Non-ischemic cardiomyopathy (HCC)   Generalized anxiety disorder   Insomnia   Sleep apnea   Acute on chronic combined systolic and diastolic CHF (congestive heart failure) (HCC)   Biventricular ICD (implantable cardioverter-defibrillator) in place   History of stroke  Clinical Impression:  Final diagnoses:  Nonspecific chest pain  Chronic hypoxic respiratory failure, on home oxygen therapy (HCC)   Subjective:  Patient seen at [name] for face to face psychiatric reevaluation. Yesterday, patient had difficulty contracting for safety stating he didn't want to go home and was very upset about not returning to [facility]. However, after being moved to a hallway bed to accommodate other critical patient's who needed a room, patient became agitated and attempted to leave stating he would rather go home than be in the hallway and that he was not suicidal anymore. Pt agitated at staff stating they needed to get him a room, refused to listen to the rationale of why the rooms were needed for medical emergency patients. After discussing case with Dr. Karene Fry, decided will IVC patient to stay in the ED and be reevaluated the next day, as we felt patient was not being rationale. Eventually a room did become available for the patient, and he became calm and cooperative again.   Today patient is more pleasant, more willing to engage in conversation. Tells me he is still upset with the way "things played out" such as not being able to  return to [name] due to oxygen needs, but does feel like he can go home today vs staying in the ED. Pt denies current suicidal ideations, pt is able to contract for safety. Denies any access to weapons or firearms. He denies homicidal ideations. Denies auditory or visual hallucinations. Pt is unsure of when he follows up next with the [name], I informed him I would give them a call to find out his next appointments. Pt denies needing any medication refills at this time.   [facility] these are the patient's next appointments - June 12: post discharge follow up appt  - June 14: substance abuse follow up appt - June 18: post discharge follow up appt - July 23: mental health eval/follow up with psychiatrist  Will make sure these appointments are in the AVS to remind patient of dates. During evaluation Luis [name] is sitting in no acute distress.  He is alert, oriented x 4, calm, cooperative and attentive.  His  mood is euthymic with congruent affect.  He has normal speech, and behavior.  Objectively there is no evidence of psychosis/mania or delusional thinking.  Patient is able to converse coherently, goal directed thoughts, no distractibility, or pre-occupation.  He also denies suicidal/self-harm/homicidal ideation, psychosis, and paranoia.  Patient answered question appropriately.  Will psychiatrically clear patient.    ED Assessment Time Calculation: Start Time: 0830 Stop Time: 0915 Total Time in Minutes (Assessment Completion): 45   Past Psychiatric History:  MDD, polysubstance abuse  Past Medical History:  Past Medical History:  Diagnosis Date   Anxiety    Biventricular ICD (implantable cardioverter-defibrillator) in  place    Chronic systolic CHF (congestive heart failure) (HCC)    Cocaine use    Diabetes mellitus without complication (HCC)    GERD (gastroesophageal reflux disease)    HLD (hyperlipidemia)    Hypertension    Morbid obesity (HCC)    NICM (nonischemic  cardiomyopathy) (HCC)    OSA (obstructive sleep apnea)    PTSD (post-traumatic stress disorder)    on Depakote   Refusal of blood transfusions as patient is Jehovah's Witness    Tobacco abuse     Past Surgical History:  Procedure Laterality Date   BIV ICD INSERTION CRT-D     FOOT FRACTURE SURGERY     ICD GENERATOR CHANGEOUT N/A 03/23/2022   Procedure: ICD GENERATOR CHANGEOUT;  Surgeon: Nelly Laurence, Roberts Gaudy, MD;  Location: [facility] INVASIVE CV LAB;  Service: Cardiovascular;  Laterality: N/A;   RIGHT HEART CATH N/A 03/06/2022   Procedure: RIGHT HEART CATH;  Surgeon: Orbie Pyo, MD;  Location: [facility] INVASIVE CV LAB;  Service: Cardiovascular;  Laterality: N/A;   RIGHT/LEFT HEART CATH AND CORONARY ANGIOGRAPHY N/A 04/16/2022   Procedure: RIGHT/LEFT HEART CATH AND CORONARY ANGIOGRAPHY;  Surgeon: Laurey Morale, MD;  Location: [name] INVASIVE CV LAB;  Service: Cardiovascular;  Laterality: N/A;   ROTATOR CUFF REPAIR     TONSILLECTOMY     Family History:  Family History  Problem Relation Age of Onset   Hypertension Mother    Bone cancer Father    Lung cancer Father    Social History:  Social History   Substance and Sexual Activity  Alcohol Use Not Currently     Social History   Substance and Sexual Activity  Drug Use Yes   Types: Cocaine, Marijuana   Comment: using in binges, 1/4gm daily x 2 weeks.    Social History   Socioeconomic History   Marital status: Widowed    Spouse name: Not on file   Number of children: 7   Years of education: Not on file   Highest education level: Bachelor's degree (e.g., BA, AB, BS)  Occupational History   Occupation: disability  Tobacco Use   Smoking status: Every Day    Packs/day: 0.50    Years: 15.00    Additional pack years: 0.00    Total pack years: 7.50    Types: Cigarettes    Last attempt to quit: 10/2021    Years since quitting: 1.1   Smokeless tobacco: Never   Tobacco comments:    occasionally  Vaping Use   Vaping Use: Never used   Substance and Sexual Activity   Alcohol use: Not Currently   Drug use: Yes    Types: Cocaine, Marijuana    Comment: using in binges, 1/4gm daily x 2 weeks.   Sexual activity: Not Currently  Other Topics Concern   Not on file  Social History Narrative   Not on file   Social Determinants of Health   Financial Resource Strain: High Risk (11/16/2022)   Overall Financial Resource Strain (CARDIA)    Difficulty of Paying Living Expenses: Hard  Food Insecurity: Food Insecurity Present (12/04/2022)   Hunger Vital Sign    Worried About Running Out of Food in the Last Year: Sometimes true    Ran Out of Food in the Last Year: Sometimes true  Transportation Needs: No Transportation Needs (12/04/2022)   PRAPARE - Administrator, Civil Service (Medical): No    Lack of Transportation (Non-Medical): No  Recent Concern: Transportation Needs - Barrister's clerk  Needs (11/16/2022)   PRAPARE - Administrator, Civil Service (Medical): No    Lack of Transportation (Non-Medical): Yes  Physical Activity: Not on file  Stress: Not on file  Social Connections: Not on file    Tobacco Cessation:  A prescription for an FDA-approved tobacco cessation medication provided at discharge and A prescription for an FDA-approved tobacco cessation medication was offered at discharge and the patient refused  Current Medications: Current Facility-Administered Medications  Medication Dose Route Frequency Provider Last Rate Last Admin   acetaminophen (TYLENOL) tablet 650 mg  650 mg Oral Q6H PRN White, Patrice L, NP   650 mg at 12/07/22 0657   albuterol (VENTOLIN HFA) 108 (90 Base) MCG/ACT inhaler 2 puff  2 puff Inhalation Q4H PRN Starleen Blue, NP   2 puff at 12/06/22 2139   alum & mag hydroxide-simeth (MAALOX/MYLANTA) 200-200-20 MG/5ML suspension 30 mL  30 mL Oral Q4H PRN White, Patrice L, NP       ARIPiprazole (ABILIFY) tablet 5 mg  5 mg Oral Daily White, Patrice L, NP   5 mg at 12/08/22 0750    carvedilol (COREG) tablet 3.125 mg  3.125 mg Oral BID WC White, Patrice L, NP   3.125 mg at 12/08/22 0754   clopidogrel (PLAVIX) tablet 75 mg  75 mg Oral Daily White, Patrice L, NP   75 mg at 12/08/22 0752   dapagliflozin propanediol (FARXIGA) tablet 10 mg  10 mg Oral Daily White, Patrice L, NP   10 mg at 12/08/22 0755   digoxin (LANOXIN) tablet 0.125 mg  0.125 mg Oral Daily White, Patrice L, NP   0.125 mg at 12/08/22 0752   diphenhydrAMINE (BENADRYL) capsule 50 mg  50 mg Oral TID PRN Liborio Nixon L, NP   50 mg at 12/08/22 0211   Or   diphenhydrAMINE (BENADRYL) injection 50 mg  50 mg Intramuscular TID PRN White, Patrice L, NP       feeding supplement (GLUCERNA SHAKE) (GLUCERNA SHAKE) liquid 237 mL  237 mL Oral TID BM Nkwenti, Doris, NP   237 mL at 12/06/22 1403   gabapentin (NEURONTIN) capsule 300 mg  300 mg Oral TID Arabella Merles, RPH   300 mg at 12/08/22 4098   haloperidol (HALDOL) tablet 5 mg  5 mg Oral TID PRN White, Patrice L, NP       Or   haloperidol lactate (HALDOL) injection 5 mg  5 mg Intramuscular TID PRN White, Patrice L, NP       hydrOXYzine (ATARAX) tablet 25 mg  25 mg Oral TID PRN White, Patrice L, NP   25 mg at 12/07/22 2126   insulin aspart (novoLOG) injection 0-15 Units  0-15 Units Subcutaneous TID WC Arabella Merles, RPH   8 Units at 12/08/22 0759   insulin aspart (novoLOG) injection 0-5 Units  0-5 Units Subcutaneous QHS Bobbitt, Shalon E, NP   5 Units at 12/07/22 2121   insulin glargine-yfgn (SEMGLEE) injection 20 Units  20 Units Subcutaneous QHS White, Patrice L, NP   20 Units at 12/07/22 2227   LORazepam (ATIVAN) tablet 2 mg  2 mg Oral TID PRN Liborio Nixon L, NP   2 mg at 12/08/22 0211   Or   LORazepam (ATIVAN) injection 2 mg  2 mg Intramuscular TID PRN White, Patrice L, NP       magnesium hydroxide (MILK OF MAGNESIA) suspension 30 mL  30 mL Oral Daily PRN White, Patrice L, NP       melatonin  tablet 5 mg  5 mg Oral QHS Nkwenti, Doris, NP   5 mg at 12/07/22 2120    potassium chloride SA (KLOR-CON M) CR tablet 20 mEq  20 mEq Oral Daily White, Patrice L, NP   20 mEq at 12/08/22 0750   prazosin (MINIPRESS) capsule 2 mg  2 mg Oral QHS White, Patrice L, NP   2 mg at 12/07/22 2121   rosuvastatin (CRESTOR) tablet 40 mg  40 mg Oral Daily Massengill, Harrold Donath, MD   40 mg at 12/08/22 0752   torsemide (DEMADEX) tablet 60 mg  60 mg Oral Daily White, Patrice L, NP   60 mg at 12/08/22 0750   traZODone (DESYREL) tablet 100 mg  100 mg Oral QHS PRN Starleen Blue, NP   100 mg at 12/07/22 2126   Current Outpatient Medications  Medication Sig Dispense Refill   acetaminophen (TYLENOL) 325 MG tablet Take 2 tablets (650 mg total) by mouth every 6 (six) hours as needed for mild pain.     ARIPiprazole (ABILIFY) 5 MG tablet Take 1 tablet (5 mg total) by mouth daily. 30 tablet 0   carvedilol (COREG) 3.125 MG tablet Take 3.125 mg by mouth 2 (two) times daily with a meal.     clopidogrel (PLAVIX) 75 MG tablet Take 1 tablet (75 mg total) by mouth daily. 30 tablet 0   dapagliflozin propanediol (FARXIGA) 10 MG TABS tablet Take 1 tablet (10 mg total) by mouth daily. 90 tablet 2   digoxin (LANOXIN) 0.125 MG tablet Take 0.125 mg by mouth daily.     DULoxetine (CYMBALTA) 30 MG capsule Take 3 capsules (90 mg total) by mouth daily. 90 capsule 0   gabapentin (NEURONTIN) 300 MG capsule Take 1 capsule (300 mg total) by mouth 3 (three) times daily. 90 capsule 0   hydrOXYzine (ATARAX) 25 MG tablet Take 1 tablet (25 mg total) by mouth 3 (three) times daily as needed for anxiety. 75 tablet 1   insulin aspart protamine- aspart (NOVOLOG MIX 70/30) (70-30) 100 UNIT/ML injection Inject 0.7 mLs (70 Units total) into the skin 2 (two) times daily with breakfast and lunch. 42 mL 0   insulin glargine-yfgn (SEMGLEE) 100 UNIT/ML injection Inject 0.2 mLs (20 Units total) into the skin at bedtime. 10 mL 11   potassium chloride SA (KLOR-CON M) 20 MEQ tablet Take 20 mEq by mouth daily.     prazosin (MINIPRESS) 2 MG  capsule Take 1 capsule (2 mg total) by mouth at bedtime. 30 capsule 1   rosuvastatin (CRESTOR) 40 MG tablet Take 1 tablet (40 mg total) by mouth daily. 30 tablet 0   sacubitril-valsartan (ENTRESTO) 97-103 MG Take 1 tablet by mouth 2 (two) times daily. 60 tablet    spironolactone (ALDACTONE) 25 MG tablet Take 1 tablet (25 mg total) by mouth daily.     torsemide (DEMADEX) 20 MG tablet Take 3 tablets (60 mg total) by mouth daily. 270 tablet 2   traZODone (DESYREL) 300 MG tablet Take 1 tablet (300 mg total) by mouth at bedtime as needed for sleep. 30 tablet 0   PTA Medications: (Not in a hospital admission)   [place] Scale:  Flowsheet Row ED from 12/04/2022 in [facility] Emergency Department at [facility] Most recent reading at 12/06/2022 11:20 PM ED from 12/03/2022 in [facility] Emergency Department [facility] Most recent reading at 12/03/2022  2:32 PM ED from 12/03/2022 in [facility] Most recent reading at 12/03/2022  1:05 PM  C-SSRS RISK CATEGORY High Risk No Risk High Risk      Psychiatric Specialty Exam: Presentation  General Appearance:  Appropriate for Environment  Eye Contact: Good  Speech: Clear and Coherent  Speech Volume: Normal  Handedness: Right   Mood and Affect  Mood: Euthymic; Irritable  Affect: Congruent   Thought Process  Thought Processes: Coherent  Descriptions of Associations:Intact  Orientation:Full (Time, Place and Person)  Thought Content:Logical; WDL  History of Schizophrenia/Schizoaffective disorder:No  Duration of Psychotic Symptoms:No data recorded Hallucinations:Hallucinations: None  Ideas of Reference:None  Suicidal Thoughts:Suicidal Thoughts: No SI Active Intent and/or Plan: -- (will not disclose any plans or intent)  Homicidal Thoughts:Homicidal Thoughts: No   Sensorium  Memory: Immediate Fair; Recent Fair  Judgment: Fair  Insight: Fair   Art therapist   Concentration: Good  Attention Span: Good  Recall: Good  Fund of Knowledge: Good  Language: Good   Psychomotor Activity  Psychomotor Activity: Psychomotor Activity: Normal   Assets  Assets: Desire for Improvement; Physical Health; Resilience; Social Support   Sleep  Sleep: Sleep: Good    Physical Exam: Physical Exam Neurological:     Mental Status: He is alert and oriented to person, place, and time.  Psychiatric:        Attention and Perception: Attention normal.        Mood and Affect: Mood normal.        Speech: Speech normal.        Behavior: Behavior is cooperative.        Thought Content: Thought content normal.    Review of Systems  Psychiatric/Behavioral:  Positive for depression and substance abuse.   All other systems reviewed and are negative.  Blood pressure 123/71, pulse 95, temperature 98.1 F (36.7 C), temperature source Oral, resp. rate 18, height 5\' 5"  (1.651 m), weight (!) 138.3 kg, SpO2 94 %. Body mass index is 50.75 kg/m.   Demographic Factors:  Male and Divorced or widowed  Loss Factors: Decline in physical health  Historical Factors: Impulsivity  Risk Reduction Factors:   Religious beliefs about death, Positive therapeutic relationship, and Positive coping skills or problem solving skills  Continued Clinical Symptoms:  Alcohol/Substance Abuse/Dependencies More than one psychiatric diagnosis  Cognitive Features That Contribute To Risk:  None    Suicide Risk:  Mild:  Suicidal ideation of limited frequency, intensity, duration, and specificity.  There are no identifiable plans, no associated intent, mild dysphoria and related symptoms, good self-control (both objective and subjective assessment), few other risk factors, and identifiable protective factors, including available and accessible social support.    Plan Of Care/Follow-up recommendations:  Other:  [facility]  Medical Decision Making: Pt  case reviewed and discussed with Dr. Lucianne Muss. Pt does not meet criteria for [facility] IVC or inpatient psychiatric treatment. [facility] [place]. Pt feels ready to return home, he is able to contract for safety. Will request ED provides transportation back to his home.    - Resources provided in AVS  Disposition: psychiatrically cleared.   Eligha Bridegroom, NP 12/08/2022, 8:47 AM

## 2022-12-08 NOTE — ED Notes (Signed)
Pt refused discharge vital signs

## 2022-12-08 NOTE — Discharge Instructions (Signed)
Outpatient psychiatric Services  Walk in hours for medication management Monday, Wednesday, Thursday, and Friday from 8:00 AM to 11:00 AM Recommend arriving by by 7:30 AM.  It is first come first serve.    Walk in hours for therapy intake Monday and Wednesday only 8:00 AM to 11:00 AM Encouraged to arrive by 7:30 AM.  It is first come first serve   Inpatient patient psychiatric services The [facility] offers comprehensive behavioral heath care services for mental health and substance abuse treatment.  Social work can also assist with referral to or getting you into a rehabilitation program short or long term       [facility]) Monday - Friday 8am - 3pm          Sat & Sun 8am - 2pm [address] [place], [place] [address]   [phone]     [email or web address] [facility] offers among other critical resources: showers, laundry, barbershop, phone bank, mailroom, computer lab, medical clinic, gardens and a bike maintenance area.   AREA SHELTERS  [facility] [employer]  (Men & women) [address] [place] [phone]  [facility] (Men/women/families) [address] [place] [phone]   [facility] (Families with children) [phone] [address]  [place] [phone]   [facility] (Domestic Violence Shelter) [address]  [place] [phone]   [facility] (Children ages 76-17) [address]  [place] [phone]   [facility]   (Women & children) [address] [place] [phone]   [facility] (Women/substance abuse) [address]  [place] [phone]   [facility] (Men) [address]   [phone]   [facility] (Men) [address]  [place] [phone]  [employer] (Women) [phone] [address]  [place] [phone]   [facility] (Single women & women  with children) [address]  [place] [phone]  [facility] (Men/women/families) [address] [phone]    [facility]   (Domestic Violence shelter) [address]  [place] [phone]   [facility] (Men & women) [address]  [place] [phone]  [facility] (Men) [address]  [employer] [phone]   [facility] (Men) [address]  [place] [phone]   [facility] (Single women & families) [address]  [place] [phone]  [facility] (Men/women & families) [address]  [place] [phone]    If you are at risk of losing your housing (throughout [facility]) call the Housing Hotline at 352-392-5242. You may also contact 2-1-1, a FREE service of the [address] that provides information about many resources including housing. Dial 211, or visit online at PooledIncome.pl. [facility]:  House of [place], contact Janelle Floor [phone] (men only)                          Huey Romans in [place]:  90 day homeless program for women and men;                             contact Rev. Chambers [phone]  [facility]:  [facility]:  men/women/children [phone]  [facility]:  Shelter in [place], Renato Gails [place] [phone]  [employer] in [place], [place] [phone]   [facility]:  [facility] for Abused Women, [phone]; (women and children)  [facility]:  [employer], men/women/children; [phone]                           [facility] in [place], [phone]; substance abuse halfway house for men              [facility]; 4 bedroom house in [facility] for homeless women, contact Jenne Campus [phone]               [facility] in [place], [phone] (women and children)               [facility], for abused women  and children, 24 hour crisis line, [phone], Jamison Oka  [facility], halfway house for women, [place], [phone]  [facility]:  [facility], [phone]; open Mon-Thurs from WCB76 - March 15 when temp is below 32 degrees                              [facility]; Alwyn Pea [place], [phone]; cell [phone]; open 24/7  [facility]:  Outreach for [place] - Rev Ladona Ridgel - [phone]  [place]:  transitional housing for women and children; Arminda Resides [phone]

## 2022-12-08 NOTE — ED Notes (Signed)
RN returned belongings to pt at this time

## 2022-12-08 NOTE — Inpatient Diabetes Management (Signed)
Inpatient Diabetes Program Recommendations  AACE/ADA: New Consensus Statement on Inpatient Glycemic Control (2015)  Target Ranges:  Prepandial:   less than 140 mg/dL      Peak postprandial:   less than 180 mg/dL (1-2 hours)      Critically ill patients:  140 - 180 mg/dL   Lab Results  Component Value Date   GLUCAP 251 (H) 12/08/2022   HGBA1C 9.2 (H) 10/05/2022    Review of Glycemic Control  Latest Reference Range & Units 12/07/22 06:50 12/07/22 17:45 12/07/22 20:04 12/07/22 21:15 12/08/22 07:56  Glucose-Capillary 70 - 99 mg/dL 161 (H) 096 (H) 045 (H) 371 (H) 251 (H)   Diabetes history: DM 2 Outpatient Diabetes medications: Farxiga 10 mg Daily, Novolog 70/30 70 units bid, Semglee 20 units qhs Current orders for Inpatient glycemic control:  Farxiga 10 mg Daily Novolog 0-15 units tid + hs Semglee 20 units qhs  Glucerna tid between meals  Inpatient Diabetes Program Recommendations:    -   Novolog 5 units tid meal coverage if eating >50% of meals  Thanks, Christena Deem RN, MSN, BC-ADM Inpatient Diabetes Coordinator Team Pager [phone] (8a-5p)

## 2022-12-08 NOTE — ED Notes (Signed)
RN called bluebird

## 2022-12-08 NOTE — ED Provider Notes (Addendum)
Emergency Medicine Observation Re-evaluation Note  Luis [name] is a 52 y.o. male, seen on rounds today.  Pt initially presented to the ED for complaints of Chest Pain Currently, the patient is sleeping on CPAP.  Physical Exam  BP 123/71 (BP Location: Right Arm)   Pulse 95   Temp 98.1 F (36.7 C) (Oral)   Resp 18   Ht 5\' 5"  (1.651 m)   Wt (!) 138.3 kg   SpO2 94%   BMI 50.75 kg/m  Physical Exam General: Sleeping on CPAP Cardiac: Regular rate Lungs: Breathing comfortably on CPAP Psych: Calm  ED Course / MDM  EKG:EKG Interpretation  Date/Time:  Sunday December 06 2022 23:29:15 EDT Ventricular Rate:  90 PR Interval:  160 QRS Duration: 149 QT Interval:  437 QTC Calculation: 535 R Axis:   75 Text Interpretation: Sinus rhythm Probable left atrial enlargement Left bundle branch block Atrial-sensed ventricular-paced rhythm No significant change was found Confirmed by Glynn Octave [phone]) on 12/06/2022 11:30:42 PM  I have reviewed the labs performed to date as well as medications administered while in observation.  Recent changes in the last 24 hours include overnight observation.  Plan  Current plan is for reassessment by psych today.  9:23 AM Patient psych and med cleared.   Mardene Sayer, MD 12/08/22 [id number]    Mardene Sayer, MD 12/08/22 [phone]

## 2022-12-10 ENCOUNTER — Ambulatory Visit: Payer: Medicare Other | Admitting: Internal Medicine

## 2023-01-04 ENCOUNTER — Telehealth (HOSPITAL_COMMUNITY): Payer: Self-pay | Admitting: Licensed Clinical Social Worker
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
## 2023-01-07 ENCOUNTER — Telehealth (HOSPITAL_COMMUNITY): Payer: Self-pay | Admitting: Licensed Clinical Social Worker

## 2023-01-07 NOTE — Telephone Encounter (Signed)
H&V Care Navigation CSW Progress Note  Clinical Social Worker spoke with pt to check in.  Pt in ICU at [facility] due to heart failure exacerbation.  [facility] working with [facility] regarding current [facility] concerns- hopefully they will be able to assist- encouraged pt to reach out to CSW if we can help further.   SDOH Screenings   Food Insecurity: Food Insecurity Present (12/04/2022)  Housing: Medium Risk (01/07/2023)  Transportation Needs: No Transportation Needs (12/04/2022)  Recent Concern: Transportation Needs - Unmet Transportation Needs (11/16/2022)  Utilities: Not At Risk (12/04/2022)  Alcohol Screen: Low Risk  (12/04/2022)  Depression (PHQ2-9): Medium Risk (11/16/2022)  Financial Resource Strain: High Risk (11/16/2022)  Social Connections: Unknown (08/26/2022)   Received from [facility]  Tobacco Use: High Risk (12/04/2022)    Burna Sis, LCSW Clinical Social Worker [facility] Desk#: [phone] Cell#: [phone]

## 2023-01-12 ENCOUNTER — Telehealth (HOSPITAL_COMMUNITY): Payer: Self-pay | Admitting: Pharmacist

## 2023-01-12 ENCOUNTER — Other Ambulatory Visit (HOSPITAL_COMMUNITY): Payer: Self-pay

## 2023-01-12 NOTE — Telephone Encounter (Signed)
Received message from clinic social worker that patient had left a VM asking for pharmacy to call him back. Per Child psychotherapist, no details about what he needed were provided. Called the callback number at [phone]  and used both provided extensions, [phone] and [phone]. No answer and no ability to leave VM at either number. Also called number listed in patient's chart, but call was unable to be completed.   Karle Plumber, PharmD, BCPS, BCCP, CPP Heart Failure Clinic Pharmacist [phone]

## 2023-01-18 ENCOUNTER — Encounter (HOSPITAL_COMMUNITY): Payer: Non-veteran care | Admitting: Cardiology

## 2023-01-18 ENCOUNTER — Ambulatory Visit (HOSPITAL_COMMUNITY): Payer: Medicare HMO

## 2023-03-03 IMAGING — MR MR HEAD W/O CM
13 of 20 series · 32 of 48 positions shown · non-contrast
Comparison: Head CT 10/09/2020. Brain MRI 01/19/2019. CT
angiography head/neck 01/19/2019.

CLINICAL DATA: Neuro deficit, acute, stroke suspected; right-sided
paresthesia, weakness, vertigo.

EXAM:
MRI HEAD WITHOUT CONTRAST
MRA HEAD WITHOUT CONTRAST
MRA NECK WITHOUT CONTRAST
TECHNIQUE: Multiplanar, multi-echo pulse sequences of the brain and surrounding
structures were acquired without intravenous contrast. Angiographic
images of the Circle of Willis were acquired using MRA technique
without intravenous contrast. Angiographic images of the neck were
acquired using MRA technique without intravenous contrast. Carotid
stenosis measurements (when applicable) are obtained utilizing
NASCET criteria, using the distal internal carotid diameter as the
denominator.

[Series 5: DWI · axial · 3.0mm · 0.88mm/px · z∈[-118,+35]mm · 4 of 104 slices shown (1 of 4)]
[im 1/104]
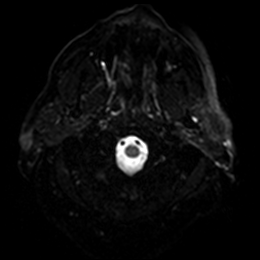
[im 35/104]
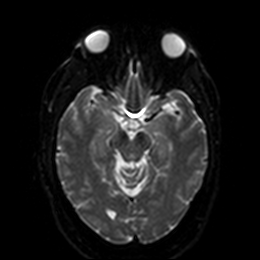
[im 69/104]
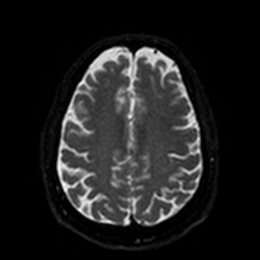
[im 104/104]
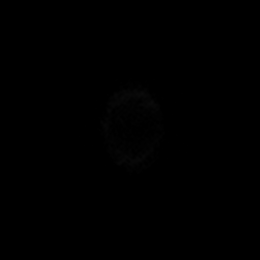

[Series 6: DWI · axial · 3.0mm · 0.88mm/px · z∈[-118,+35]mm · 2 of 52 slices shown (2 of 4)]
[im 1/52]
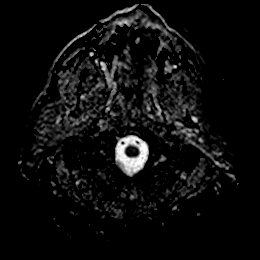
[im 52/52]
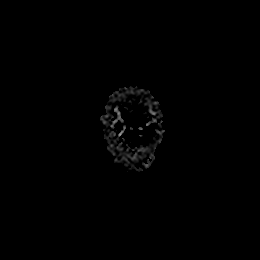

[Series 7: DWI · coronal · 4.0mm · 0.88mm/px · 3 of 70 slices shown (3 of 4)]
[im 1/70]
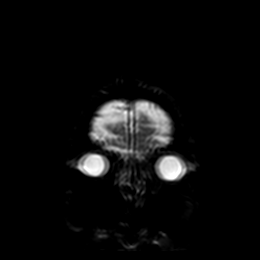
[im 35/70]
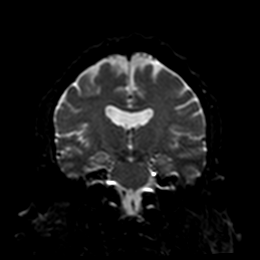
[im 70/70]
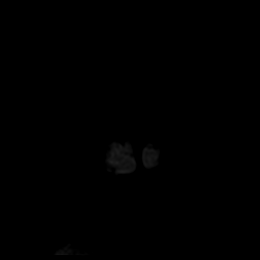

[Series 8: DWI · coronal · 4.0mm · 0.88mm/px · 2 of 35 slices shown (4 of 4)]
[im 1/35]
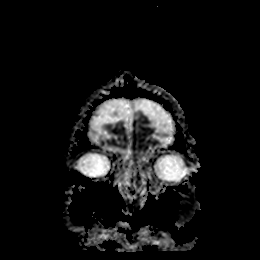
[im 35/35]
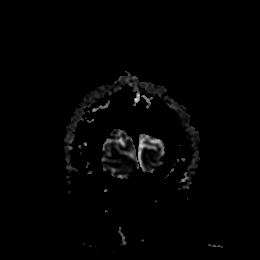

[Series 13: FLAIR · axial · 5.0mm · 0.45mm/px · 1 of 27 slices shown]
[im 1/27]
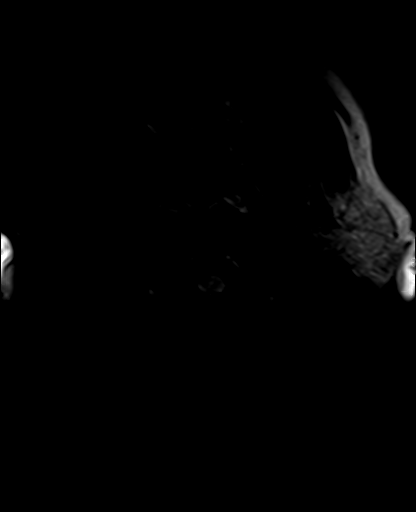

[Series 14: T2 · axial · 5.0mm · 0.72mm/px · 1 of 27 slices shown (1 of 2)]
[im 1/27]
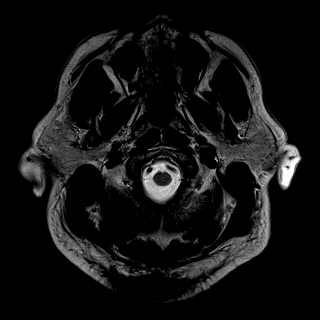

[Series 15: T1 · sagittal · 5.0mm · 0.75mm/px · 1 of 23 slices shown]
[im 1/23]
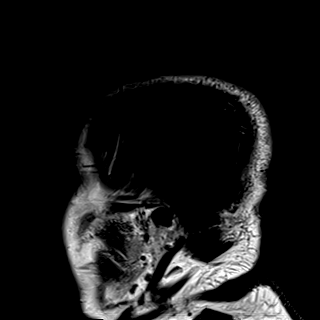

[Series 16: mag_images · axial · 3.0mm · 0.90mm/px · z∈[-124,+53]mm · 3 of 60 slices shown]
[im 1/60]
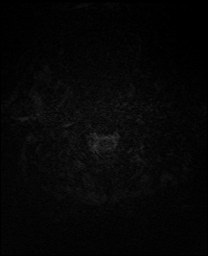
[im 30/60]
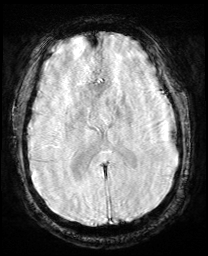
[im 60/60]
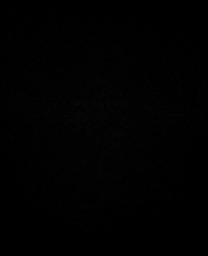

[Series 17: pha_images · axial · 3.0mm · 0.90mm/px · z∈[-121,+38]mm · 2 of 54 slices shown]
[im 1/54]
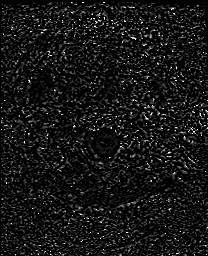
[im 54/54]
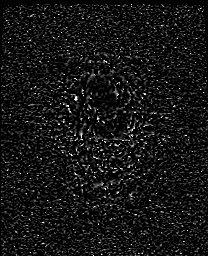

[Series 18: swi_images · axial · 3.0mm · 0.90mm/px · z∈[-124,+53]mm · 3 of 60 slices shown]
[im 1/60]
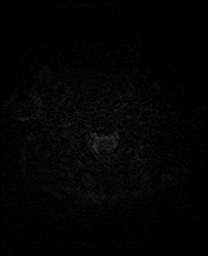
[im 30/60]
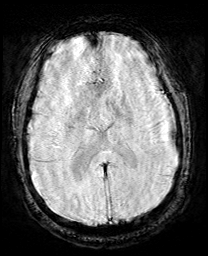
[im 60/60]
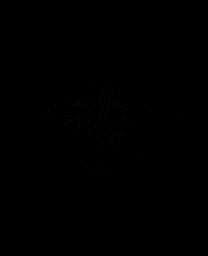

[Series 19: mip_images(sw) · axial · 24.0mm · 0.90mm/px · z∈[-114,+42]mm · 2 of 53 slices shown]
[im 1/53]
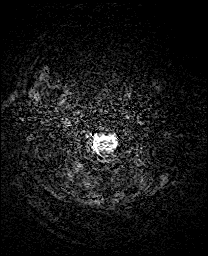
[im 53/53]
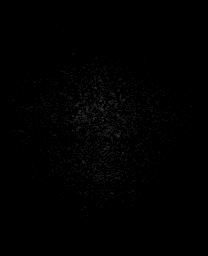

[Series 21: T2 · coronal · 5.0mm · 0.34mm/px · 1 of 29 slices shown (2 of 2)]
[im 1/29]
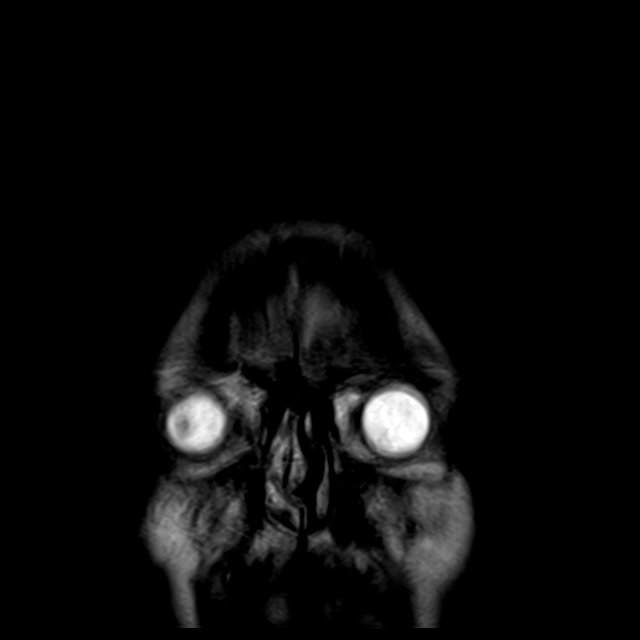

[Series 26: tof_fl3d_tra_iso · axial · 0.6mm · 0.52mm/px · z∈[-224,-127]mm · 7 of 164 slices shown]
[im 1/164]
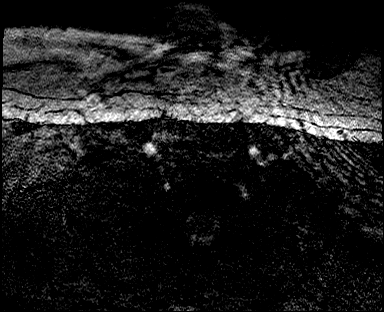
[im 28/164]
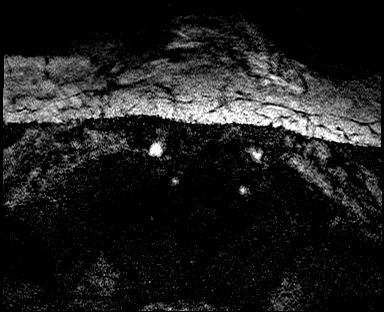
[im 55/164]
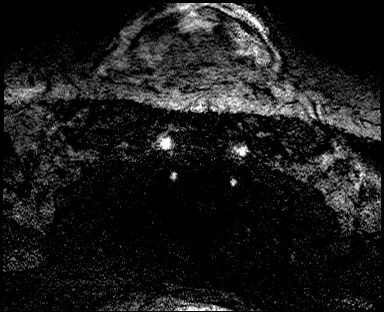
[im 82/164]
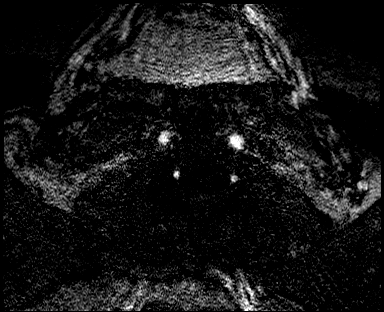
[im 109/164]
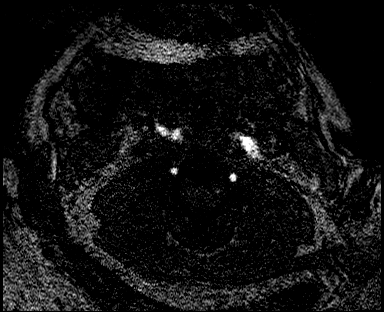
[im 136/164]
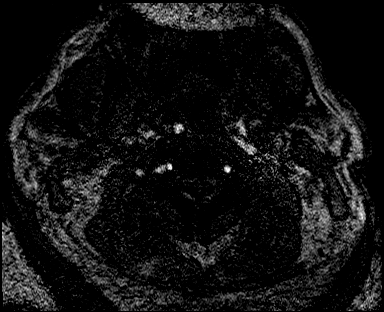
[im 164/164]
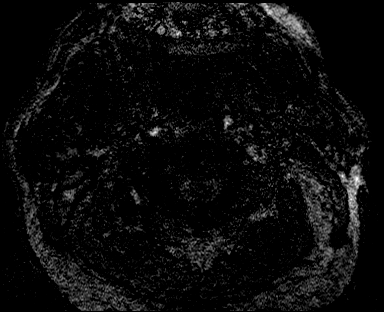

[32 of 48 positions shown; findings below may reference images not displayed]

FINDINGS: MRI HEAD FINDINGS

Brain:

Mild intermittent motion degradation.

Mild generalized parenchymal atrophy.

There is an apparent punctate focus of restricted diffusion within
the left lentiform nucleus, appreciated on the axial
diffusion-weighted sequence only (series 5, image 74) (not
appreciated on the coronal diffusion-weighted sequence). This may
reflect a punctate acute/early subacute infarct or image noise
artifact.

No cortical encephalomalacia is identified.

No significant white matter disease.

No evidence of intracranial mass.

No chronic intracranial blood products.

No extra-axial fluid collection.

No midline shift.

Vascular: Expected proximal arterial flow voids.

Skull and upper cervical spine: No focal marrow lesion.

Sinuses/Orbits: Visualized orbits show no acute finding. No
significant paranasal sinus disease.

MRA HEAD FINDINGS

The examination is mildly motion degraded.

Anterior circulation:

The intracranial internal carotid arteries are patent. The M1 middle
cerebral arteries are patent. No M2 proximal branch occlusion or
high-grade proximal stenosis is identified. The anterior cerebral
arteries are patent.

2 mm laterally projecting aneurysm arising from the paraclinoid left
ICA (series 9, image 76). In retrospect, this was present on the
prior CTA of 01/19/2019 (but is seen to better advantage on today's
exam) and unchanged in size since that time.

1-2 mm inferiorly projecting vascular protrusion arising from the
paraclinoid left ICA which may reflect an additional aneurysm or an
infundibulum (series 9, image 74).

2-3 mm infundibulum at the origin of the right posterior
communicating artery.

Posterior circulation:

The intracranial vertebral arteries are patent. Possible basilar
artery fenestration (versus artifact from motion at this level) the
posterior cerebral arteries are patent. A right posterior
communicating artery is present. The left posterior communicating
artery is hypoplastic or absent.

Anatomic variants: As described

MRA NECK FINDINGS

The examination is significantly motion degraded and this precludes
adequate evaluation for stenoses within the common carotid, internal
carotid and vertebral arteries within the neck.

Aortic arch: Excluded from the field of view.

Right carotid system: Patent with antegrade flow.

Left carotid system: Patent with antegrade flow.

Vertebral arteries: Patent with antegrade flow.
IMPRESSION: MRI brain:

1. Mildly motion degraded examination.
2. Punctate acute/early subacute infarct within the left basal
ganglia versus image noise artifact (see discussion above).
3. Mild generalized parenchymal atrophy.

MRA head:

1. Mildly motion degraded examination, somewhat limiting evaluation.
2. No evidence of intracranial large vessel occlusion or proximal
high-grade arterial stenosis.
3. 2 mm aneurysm arising from the paraclinoid left internal carotid
artery.
4. 1-2 mm vascular protrusion arising from the paraclinoid left
internal carotid artery, which may reflect an additional aneurysm or
an infundibulum.
5. Possible basilar artery fenestration (versus artifact from motion
at this level).

MRA neck:

The common carotid, internal carotid and vertebral arteries are
patent within the neck with antegrade flow. However, significant
motion degradation precludes adequate evaluation for stenoses within
these vessels.

## 2023-04-27 ENCOUNTER — Encounter (HOSPITAL_COMMUNITY): Payer: Non-veteran care

## 2023-05-05 ENCOUNTER — Telehealth: Payer: Self-pay | Admitting: Neurology

## 2023-05-05 ENCOUNTER — Encounter (HOSPITAL_COMMUNITY): Payer: Non-veteran care

## 2023-05-05 NOTE — Telephone Encounter (Signed)
I'm sorry but we haven't seen the patient in over 2.5 years. He needs to schedule an appointment first with any nurse practitioner or Dr Frances Furbish before we are able to help with any orders. Can you assist him with scheduling an appt? If its going to be February or after before we can get him in, he needs a new referral per office policy as it will be 3 years since seen when February 2025 comes.

## 2023-05-05 NOTE — Telephone Encounter (Signed)
Pt called in because he is on vacation and lost his CPAP machine. He is asking for a new order to be sent in to [facility] so he can get a replacement machine.

## 2023-05-05 NOTE — Telephone Encounter (Signed)
LVM informing pt that we need an appointment before we can help with CPAP orders. I have held slots with Maralyn Sago for 05/13/23 at 1:45pm and Shanda Bumps for 06/10/23 at 3:45pm in case pt calls back.

## 2023-06-09 ENCOUNTER — Telehealth: Payer: Self-pay | Admitting: Neurology

## 2023-06-09 NOTE — Telephone Encounter (Signed)
Patient had left a message on Alia's VM, sent a message to [place] to call pt as there was no sleep referral for patient so assumed it was for neurology. I called patient back and he stated he has been back and forth with our office, he has congestive heart failure, lost his CPAP in [facility] and we won't see him. States he has been to the hospital multiple times and been told he needs to be back on CPAP therapy ASAP. States he's dying without it. I message Zane Herald while on the phone as patient stated we should have received a referral from [facility] last month. Per Alia-no referral has been received. I saw in his chart he was seen by [facility] 11/6 and appears they referred to a different sleep center in [place]. I gave him the office number for that office he saw and told him to call and have them resend it to our office so we can get him scheduled as we are not trying to delay his care in any way, we just need a new referral since it has been over 3 years since we have seen him and that we are just following office policy. I let him know as soon as we receive his referral, we will give him a call to check in, but if he does not hear back in a week to give [place] a call back. FYI

## 2023-06-09 NOTE — Telephone Encounter (Signed)
I re reviewed the patient's chart and previous telephone notes and pt does NOT need a new referral until February 2025. I called patient back and set him up for an office visit next week with Shanda Bumps.

## 2023-06-09 NOTE — Telephone Encounter (Signed)
Pt called to confirm upcoming appt details

## 2023-06-12 ENCOUNTER — Emergency Department (HOSPITAL_COMMUNITY): Payer: No Typology Code available for payment source

## 2023-06-12 ENCOUNTER — Inpatient Hospital Stay (HOSPITAL_COMMUNITY)
Admission: EM | Admit: 2023-06-12 | Discharge: 2023-06-16 | DRG: 880 | Disposition: A | Discharge status: Rehabilitation Facility | Payer: No Typology Code available for payment source | Attending: Internal Medicine | Admitting: Internal Medicine

## 2023-06-12 ENCOUNTER — Encounter (HOSPITAL_COMMUNITY): Payer: Self-pay

## 2023-06-12 ENCOUNTER — Other Ambulatory Visit: Payer: Self-pay

## 2023-06-12 DIAGNOSIS — Z886 Allergy status to analgesic agent status: Secondary | ICD-10-CM

## 2023-06-12 DIAGNOSIS — K219 Gastro-esophageal reflux disease without esophagitis: Secondary | ICD-10-CM | POA: Diagnosis present

## 2023-06-12 DIAGNOSIS — E1165 Type 2 diabetes mellitus with hyperglycemia: Secondary | ICD-10-CM | POA: Diagnosis not present

## 2023-06-12 DIAGNOSIS — Z79899 Other long term (current) drug therapy: Secondary | ICD-10-CM

## 2023-06-12 DIAGNOSIS — R531 Weakness: Secondary | ICD-10-CM | POA: Diagnosis not present

## 2023-06-12 DIAGNOSIS — I5042 Chronic combined systolic (congestive) and diastolic (congestive) heart failure: Secondary | ICD-10-CM | POA: Diagnosis present

## 2023-06-12 DIAGNOSIS — F449 Dissociative and conversion disorder, unspecified: Principal | ICD-10-CM | POA: Diagnosis present

## 2023-06-12 DIAGNOSIS — G4733 Obstructive sleep apnea (adult) (pediatric): Secondary | ICD-10-CM | POA: Diagnosis present

## 2023-06-12 DIAGNOSIS — J9611 Chronic respiratory failure with hypoxia: Secondary | ICD-10-CM | POA: Diagnosis not present

## 2023-06-12 DIAGNOSIS — E119 Type 2 diabetes mellitus without complications: Secondary | ICD-10-CM

## 2023-06-12 DIAGNOSIS — I5020 Unspecified systolic (congestive) heart failure: Secondary | ICD-10-CM | POA: Diagnosis present

## 2023-06-12 DIAGNOSIS — Z8249 Family history of ischemic heart disease and other diseases of the circulatory system: Secondary | ICD-10-CM

## 2023-06-12 DIAGNOSIS — Z9981 Dependence on supplemental oxygen: Secondary | ICD-10-CM

## 2023-06-12 DIAGNOSIS — I110 Hypertensive heart disease with heart failure: Secondary | ICD-10-CM | POA: Diagnosis present

## 2023-06-12 DIAGNOSIS — Z794 Long term (current) use of insulin: Secondary | ICD-10-CM

## 2023-06-12 DIAGNOSIS — E66813 Obesity, class 3: Secondary | ICD-10-CM | POA: Diagnosis present

## 2023-06-12 DIAGNOSIS — Z885 Allergy status to narcotic agent status: Secondary | ICD-10-CM

## 2023-06-12 DIAGNOSIS — Z6841 Body Mass Index (BMI) 40.0 and over, adult: Secondary | ICD-10-CM

## 2023-06-12 DIAGNOSIS — Z808 Family history of malignant neoplasm of other organs or systems: Secondary | ICD-10-CM

## 2023-06-12 DIAGNOSIS — I10 Essential (primary) hypertension: Secondary | ICD-10-CM | POA: Diagnosis present

## 2023-06-12 DIAGNOSIS — E785 Hyperlipidemia, unspecified: Secondary | ICD-10-CM | POA: Diagnosis present

## 2023-06-12 DIAGNOSIS — Z91040 Latex allergy status: Secondary | ICD-10-CM

## 2023-06-12 DIAGNOSIS — E1169 Type 2 diabetes mellitus with other specified complication: Secondary | ICD-10-CM

## 2023-06-12 DIAGNOSIS — Z880 Allergy status to penicillin: Secondary | ICD-10-CM

## 2023-06-12 DIAGNOSIS — Z91041 Radiographic dye allergy status: Secondary | ICD-10-CM

## 2023-06-12 DIAGNOSIS — F419 Anxiety disorder, unspecified: Secondary | ICD-10-CM | POA: Diagnosis present

## 2023-06-12 DIAGNOSIS — F4310 Post-traumatic stress disorder, unspecified: Secondary | ICD-10-CM | POA: Diagnosis present

## 2023-06-12 DIAGNOSIS — E782 Mixed hyperlipidemia: Secondary | ICD-10-CM | POA: Diagnosis not present

## 2023-06-12 DIAGNOSIS — I428 Other cardiomyopathies: Secondary | ICD-10-CM | POA: Diagnosis present

## 2023-06-12 DIAGNOSIS — I5040 Unspecified combined systolic (congestive) and diastolic (congestive) heart failure: Secondary | ICD-10-CM | POA: Diagnosis present

## 2023-06-12 DIAGNOSIS — F17210 Nicotine dependence, cigarettes, uncomplicated: Secondary | ICD-10-CM | POA: Diagnosis present

## 2023-06-12 DIAGNOSIS — Z7902 Long term (current) use of antithrombotics/antiplatelets: Secondary | ICD-10-CM

## 2023-06-12 DIAGNOSIS — Z888 Allergy status to other drugs, medicaments and biological substances status: Secondary | ICD-10-CM

## 2023-06-12 DIAGNOSIS — Z91010 Allergy to peanuts: Secondary | ICD-10-CM

## 2023-06-12 DIAGNOSIS — Z801 Family history of malignant neoplasm of trachea, bronchus and lung: Secondary | ICD-10-CM

## 2023-06-12 DIAGNOSIS — Z95810 Presence of automatic (implantable) cardiac defibrillator: Secondary | ICD-10-CM | POA: Diagnosis present

## 2023-06-12 DIAGNOSIS — G4730 Sleep apnea, unspecified: Secondary | ICD-10-CM | POA: Diagnosis present

## 2023-06-12 LAB — COMPREHENSIVE METABOLIC PANEL
ALT: 18 U/L (ref 0–44)
AST: 19 U/L (ref 15–41)
Albumin: 3.2 g/dL — ABNORMAL LOW (ref 3.5–5.0)
Alkaline Phosphatase: 67 U/L (ref 38–126)
Anion gap: 14 (ref 5–15)
BUN: 10 mg/dL (ref 6–20)
CO2: 24 mmol/L (ref 22–32)
Calcium: 9.5 mg/dL (ref 8.9–10.3)
Chloride: 99 mmol/L (ref 98–111)
Creatinine, Ser: 0.86 mg/dL (ref 0.61–1.24)
GFR, Estimated: >60 mL/min (ref >60)
Glucose, Bld: 421 mg/dL — ABNORMAL HIGH (ref 70–99)
Potassium: 4.5 mmol/L (ref 3.5–5.1)
Sodium: 137 mmol/L (ref 135–145)
Total Bilirubin: 0.5 mg/dL (ref <1.2)
Total Protein: 6.4 g/dL — ABNORMAL LOW (ref 6.5–8.1)

## 2023-06-12 LAB — TROPONIN I (HIGH SENSITIVITY)
Troponin I (High Sensitivity): 15 ng/L (ref <18)
Troponin I (High Sensitivity): 17 ng/L (ref <18)

## 2023-06-12 LAB — CBC
HCT: 41.9 % (ref 39.0–52.0)
Hemoglobin: 13.6 g/dL (ref 13.0–17.0)
MCH: 28.6 pg (ref 26.0–34.0)
MCHC: 32.5 g/dL (ref 30.0–36.0)
MCV: 88 fL (ref 80.0–100.0)
Platelets: 237 10*3/uL (ref 150–400)
RBC: 4.76 MIL/uL (ref 4.22–5.81)
RDW: 12.8 % (ref 11.5–15.5)
WBC: 7.5 10*3/uL (ref 4.0–10.5)
nRBC: 0 % (ref 0.0–0.2)

## 2023-06-12 LAB — URINALYSIS, ROUTINE W REFLEX MICROSCOPIC
Bacteria, UA: NONE SEEN
Bilirubin Urine: NEGATIVE
Glucose, UA: >=500 mg/dL — AB
Ketones, ur: NEGATIVE mg/dL
Leukocytes,Ua: NEGATIVE
Nitrite: NEGATIVE
Protein, ur: NEGATIVE mg/dL
Specific Gravity, Urine: 1.026 (ref 1.005–1.030)
pH: 5 (ref 5.0–8.0)

## 2023-06-12 LAB — DIFFERENTIAL
Abs Immature Granulocytes: 0.02 10*3/uL (ref 0.00–0.07)
Basophils Absolute: 0 10*3/uL (ref 0.0–0.1)
Basophils Relative: 0 %
Eosinophils Absolute: 0.1 10*3/uL (ref 0.0–0.5)
Eosinophils Relative: 1 %
Immature Granulocytes: 0 %
Lymphocytes Relative: 23 %
Lymphs Abs: 1.8 10*3/uL (ref 0.7–4.0)
Monocytes Absolute: 0.7 10*3/uL (ref 0.1–1.0)
Monocytes Relative: 9 %
Neutro Abs: 5 10*3/uL (ref 1.7–7.7)
Neutrophils Relative %: 67 %

## 2023-06-12 LAB — CBG MONITORING, ED
Glucose-Capillary: 407 mg/dL — ABNORMAL HIGH (ref 70–99)
Glucose-Capillary: 254 mg/dL — ABNORMAL HIGH (ref 70–99)

## 2023-06-12 LAB — APTT: aPTT: 24 s (ref 24–36)

## 2023-06-12 LAB — RAPID URINE DRUG SCREEN, HOSP PERFORMED
Amphetamines: NOT DETECTED
Barbiturates: NOT DETECTED
Benzodiazepines: NOT DETECTED
Cocaine: NOT DETECTED
Opiates: NOT DETECTED
Tetrahydrocannabinol: NOT DETECTED

## 2023-06-12 LAB — PROTIME-INR
INR: 0.9 (ref 0.8–1.2)
Prothrombin Time: 12.5 s (ref 11.4–15.2)

## 2023-06-12 LAB — ETHANOL: Alcohol, Ethyl (B): <10 mg/dL (ref <10)

## 2023-06-12 LAB — BRAIN NATRIURETIC PEPTIDE: B Natriuretic Peptide: 118.4 pg/mL — ABNORMAL HIGH (ref 0.0–100.0)

## 2023-06-12 MED ORDER — MORPHINE SULFATE (PF) 4 MG/ML IV SOLN
4.0000 mg | Freq: Once | INTRAVENOUS | Status: AC
  Administered 2023-06-12: 4 mg via INTRAVENOUS
  Filled 2023-06-12: qty 1

## 2023-06-12 MED ORDER — TRAZODONE HCL 100 MG PO TABS
300.0000 mg | ORAL_TABLET | Freq: Every evening | ORAL | Status: DC | PRN
  Administered 2023-06-13 – 2023-06-15 (×3): 300 mg via ORAL
  Filled 2023-06-12 (×3): qty 3

## 2023-06-12 MED ORDER — PANTOPRAZOLE SODIUM 40 MG PO TBEC
40.0000 mg | DELAYED_RELEASE_TABLET | Freq: Every day | ORAL | Status: DC
  Administered 2023-06-13 – 2023-06-16 (×4): 40 mg via ORAL
  Filled 2023-06-12 (×4): qty 1

## 2023-06-12 MED ORDER — MORPHINE SULFATE (PF) 2 MG/ML IV SOLN
2.0000 mg | Freq: Once | INTRAVENOUS | Status: AC
  Administered 2023-06-12: 2 mg via INTRAVENOUS
  Filled 2023-06-12: qty 1

## 2023-06-12 MED ORDER — STROKE: EARLY STAGES OF RECOVERY BOOK
Freq: Once | Status: DC
  Filled 2023-06-12: qty 1

## 2023-06-12 MED ORDER — SPIRONOLACTONE 25 MG PO TABS
25.0000 mg | ORAL_TABLET | Freq: Every day | ORAL | Status: DC
  Administered 2023-06-13 – 2023-06-16 (×4): 25 mg via ORAL
  Filled 2023-06-12 (×4): qty 1

## 2023-06-12 MED ORDER — ACETAMINOPHEN 500 MG PO TABS
1000.0000 mg | ORAL_TABLET | Freq: Once | ORAL | Status: AC
  Administered 2023-06-12: 1000 mg via ORAL
  Filled 2023-06-12: qty 2

## 2023-06-12 MED ORDER — DIGOXIN 125 MCG PO TABS
0.1250 mg | ORAL_TABLET | Freq: Every day | ORAL | Status: DC
  Administered 2023-06-13 – 2023-06-16 (×4): 0.125 mg via ORAL
  Filled 2023-06-12 (×4): qty 1

## 2023-06-12 MED ORDER — CLOPIDOGREL BISULFATE 75 MG PO TABS
75.0000 mg | ORAL_TABLET | Freq: Every day | ORAL | Status: DC
  Administered 2023-06-13 – 2023-06-14 (×2): 75 mg via ORAL
  Filled 2023-06-12 (×2): qty 1

## 2023-06-12 MED ORDER — BUMETANIDE 2 MG PO TABS
2.0000 mg | ORAL_TABLET | Freq: Two times a day (BID) | ORAL | Status: DC
  Administered 2023-06-13 – 2023-06-16 (×4): 2 mg via ORAL
  Filled 2023-06-12 (×10): qty 1

## 2023-06-12 MED ORDER — INSULIN ASPART 100 UNIT/ML IJ SOLN
0.0000 [IU] | Freq: Three times a day (TID) | INTRAMUSCULAR | Status: DC
  Administered 2023-06-13: 15 [IU] via SUBCUTANEOUS
  Administered 2023-06-13 (×3): 20 [IU] via SUBCUTANEOUS
  Administered 2023-06-14 (×3): 15 [IU] via SUBCUTANEOUS
  Administered 2023-06-14 – 2023-06-15 (×2): 20 [IU] via SUBCUTANEOUS
  Administered 2023-06-15: 15 [IU] via SUBCUTANEOUS
  Administered 2023-06-15 – 2023-06-16 (×3): 11 [IU] via SUBCUTANEOUS
  Administered 2023-06-16: 20 [IU] via SUBCUTANEOUS
  Administered 2023-06-16: 11 [IU] via SUBCUTANEOUS

## 2023-06-12 MED ORDER — INSULIN GLARGINE-YFGN 100 UNIT/ML ~~LOC~~ SOLN
20.0000 [IU] | Freq: Every day | SUBCUTANEOUS | Status: DC
  Administered 2023-06-13 (×2): 20 [IU] via SUBCUTANEOUS
  Filled 2023-06-12 (×5): qty 0.2

## 2023-06-12 MED ORDER — ROSUVASTATIN CALCIUM 20 MG PO TABS
40.0000 mg | ORAL_TABLET | Freq: Every day | ORAL | Status: DC
Start: 2023-06-13 — End: 2023-06-16
  Administered 2023-06-13 – 2023-06-16 (×4): 40 mg via ORAL
  Filled 2023-06-12 (×4): qty 2

## 2023-06-12 MED ORDER — CARVEDILOL 3.125 MG PO TABS
3.1250 mg | ORAL_TABLET | Freq: Two times a day (BID) | ORAL | Status: DC
  Administered 2023-06-13 – 2023-06-16 (×7): 3.125 mg via ORAL
  Filled 2023-06-12 (×7): qty 1

## 2023-06-12 MED ORDER — INSULIN ASPART 100 UNIT/ML IJ SOLN
6.0000 [IU] | Freq: Once | INTRAMUSCULAR | Status: AC
Start: 2023-06-12 — End: 2023-06-12
  Administered 2023-06-12: 6 [IU] via SUBCUTANEOUS

## 2023-06-12 MED ORDER — SENNOSIDES-DOCUSATE SODIUM 8.6-50 MG PO TABS
1.0000 | ORAL_TABLET | Freq: Every evening | ORAL | Status: DC | PRN
Start: 2023-06-12 — End: 2023-06-16

## 2023-06-12 MED ORDER — POTASSIUM CHLORIDE CRYS ER 20 MEQ PO TBCR
20.0000 meq | EXTENDED_RELEASE_TABLET | Freq: Every day | ORAL | Status: DC
  Administered 2023-06-13 – 2023-06-16 (×4): 20 meq via ORAL
  Filled 2023-06-12 (×4): qty 1

## 2023-06-12 MED ORDER — ACETAMINOPHEN 650 MG RE SUPP: 650.0000 mg | RECTAL | Status: DC | PRN

## 2023-06-12 MED ORDER — ACETAMINOPHEN 325 MG PO TABS
650.0000 mg | ORAL_TABLET | ORAL | Status: DC | PRN
  Filled 2023-06-12 (×2): qty 2

## 2023-06-12 MED ORDER — HYDROXYZINE HCL 25 MG PO TABS
25.0000 mg | ORAL_TABLET | Freq: Three times a day (TID) | ORAL | Status: DC | PRN
Start: 2023-06-12 — End: 2023-06-16
  Administered 2023-06-15: 25 mg via ORAL
  Filled 2023-06-12: qty 1

## 2023-06-12 MED ORDER — ACETAMINOPHEN 160 MG/5ML PO SOLN: 650.0000 mg | ORAL | Status: DC | PRN

## 2023-06-12 MED ORDER — ENOXAPARIN SODIUM 40 MG/0.4ML IJ SOSY
40.0000 mg | PREFILLED_SYRINGE | INTRAMUSCULAR | Status: DC
  Administered 2023-06-12 – 2023-06-15 (×4): 40 mg via SUBCUTANEOUS
  Filled 2023-06-12 (×5): qty 0.4

## 2023-06-12 NOTE — ED Notes (Signed)
ED TO INPATIENT HANDOFF REPORT  ED Nurse Name and Phone #: [phone]  S Name/Age/Gender Luis [name] 52 y.o. male Room/Bed: 026C/026C  Code Status   Code Status: Full Code  Home/SNF/Other Home Patient oriented to: self, place, time, and situation Is this baseline? Yes   Triage Complete: Triage complete  Chief Complaint Right sided weakness [R53.1]  Triage Note PER EMS: pt is from home with c/o right sided weakness and decreased sensation to entire right side of his body that started 5 days ago. He reports he fell today due to this weakness and hit his head on the dresser. Denies LOC, but he does take Plavix.He reports blurry vision & having a headache but this started before his fall His CBG was 518 with ems  BP-181/100, HR- 102 paced rhythm, O2-99% on chronic 2L [place]. He received NS en route.   Allergies Allergies  Allergen Reactions   Aspirin Anaphylaxis and Other (See Comments)    Throat closing    Iodine-131 Hives   Peanut (Diagnostic) Anaphylaxis    Throat closes   Iodinated Contrast Media Hives    Can be premedicated    Latex Hives and Rash   Penicillins Nausea And Vomiting   Ultram [Tramadol] Hives and Nausea Only   Zestril [Lisinopril] Cough   Cinnamon    Amoxicillin-Pot Clavulanate Diarrhea   Lidocaine Rash    Rash from adhesive on lidocaine patches     Level of Care/Admitting Diagnosis ED Disposition     ED Disposition  Admit   Condition  --   Comment  Hospital Area: [facility] [[id number]]  Level of Care: Telemetry Medical [104]  May place patient in observation at [facility] or [facility] if equivalent level of care is available:: No  Covid Evaluation: Asymptomatic - no recent exposure (last 10 days) testing not required  Diagnosis: Right sided weakness [708726]  Admitting Physician: Anselm Jungling [[id number]]  Attending Physician: Anselm Jungling [[id number]]          B Medical/Surgery History Past Medical History:   Diagnosis Date   Anxiety    Biventricular ICD (implantable cardioverter-defibrillator) in place    Chronic systolic CHF (congestive heart failure) (HCC)    Cocaine use    Diabetes mellitus without complication (HCC)    GERD (gastroesophageal reflux disease)    HLD (hyperlipidemia)    Hypertension    Morbid obesity (HCC)    NICM (nonischemic cardiomyopathy) (HCC)    OSA (obstructive sleep apnea)    PTSD (post-traumatic stress disorder)    on Depakote   Refusal of blood transfusions as patient is Jehovah's Witness    Tobacco abuse    Past Surgical History:  Procedure Laterality Date   BIV ICD INSERTION CRT-D     FOOT FRACTURE SURGERY     ICD GENERATOR CHANGEOUT N/A 03/23/2022   Procedure: ICD GENERATOR CHANGEOUT;  Surgeon: Nelly Laurence, Roberts Gaudy, MD;  Location: [facility] INVASIVE CV LAB;  Service: Cardiovascular;  Laterality: N/A;   RIGHT HEART CATH N/A 03/06/2022   Procedure: RIGHT HEART CATH;  Surgeon: Orbie Pyo, MD;  Location: [facility] INVASIVE CV LAB;  Service: Cardiovascular;  Laterality: N/A;   RIGHT/LEFT HEART CATH AND CORONARY ANGIOGRAPHY N/A 04/16/2022   Procedure: RIGHT/LEFT HEART CATH AND CORONARY ANGIOGRAPHY;  Surgeon: Laurey Morale, MD;  Location: [facility] INVASIVE CV LAB;  Service: Cardiovascular;  Laterality: N/A;   ROTATOR CUFF REPAIR     TONSILLECTOMY       A IV Location/Drains/Wounds  Patient Lines/Drains/Airways Status     Active Line/Drains/Airways     Name Placement date Placement time Site Days   Peripheral IV 06/12/23 20 G Left;Posterior Hand 06/12/23  1539  Hand  less than 1            Intake/Output Last 24 hours  Intake/Output Summary (Last 24 hours) at 06/12/2023 2214 Last data filed at 06/12/2023 1540 Gross per 24 hour  Intake 500 ml  Output --  Net 500 ml    Labs/Imaging Results for orders placed or performed during the hospital encounter of 06/12/23 (from the past 48 hours)  CBG monitoring, ED     Status: Abnormal   Collection Time: 06/12/23   3:30 PM  Result Value Ref Range   Glucose-Capillary 407 (H) 70 - 99 mg/dL    Comment: Glucose reference range applies only to samples taken after fasting for at least 8 hours.   Comment 1 Notify RN    Comment 2 Document in Chart   Ethanol     Status: None   Collection Time: 06/12/23  3:39 PM  Result Value Ref Range   Alcohol, Ethyl (B) <10 <10 mg/dL    Comment: (NOTE) Lowest detectable limit for serum alcohol is 10 mg/dL.  For medical purposes only. Performed at [facility], [address] [place], [place] [address]   Protime-INR     Status: None   Collection Time: 06/12/23  3:39 PM  Result Value Ref Range   Prothrombin Time 12.5 11.4 - 15.2 seconds   INR 0.9 0.8 - 1.2    Comment: (NOTE) INR goal varies based on device and disease states. Performed at [facility], [address] [place], [place] [address]   APTT     Status: None   Collection Time: 06/12/23  3:39 PM  Result Value Ref Range   aPTT 24 24 - 36 seconds    Comment: Performed at [facility], [address], [place], [place] [address]  CBC     Status: None   Collection Time: 06/12/23  3:39 PM  Result Value Ref Range   WBC 7.5 4.0 - 10.5 K/uL   RBC 4.76 4.22 - 5.81 MIL/uL   Hemoglobin 13.6 13.0 - 17.0 g/dL   HCT 44.0 34.7 - 42.5 %   MCV 88.0 80.0 - 100.0 fL   MCH 28.6 26.0 - 34.0 pg   MCHC 32.5 30.0 - 36.0 g/dL   RDW 95.6 38.7 - 56.4 %   Platelets 237 150 - 400 K/uL   nRBC 0.0 0.0 - 0.2 %    Comment: Performed at [facility], [address] [place], [place] [address]  Differential     Status: None   Collection Time: 06/12/23  3:39 PM  Result Value Ref Range   Neutrophils Relative % 67 %   Neutro Abs 5.0 1.7 - 7.7 K/uL   Lymphocytes Relative 23 %   Lymphs Abs 1.8 0.7 - 4.0 K/uL   Monocytes Relative 9 %   Monocytes Absolute 0.7 0.1 - 1.0 K/uL   Eosinophils Relative 1 %   Eosinophils Absolute 0.1 0.0 - 0.5 K/uL   Basophils Relative 0 %   Basophils Absolute 0.0 0.0 - 0.1  K/uL   Immature Granulocytes 0 %   Abs Immature Granulocytes 0.02 0.00 - 0.07 K/uL    Comment: Performed at [facility], [address], [place], [place] [address]  Comprehensive metabolic panel  Status: Abnormal   Collection Time: 06/12/23  3:39 PM  Result Value Ref Range   Sodium 137 135 - 145 mmol/L   Potassium 4.5 3.5 - 5.1 mmol/L   Chloride 99 98 - 111 mmol/L   CO2 24 22 - 32 mmol/L   Glucose, Bld 421 (H) 70 - 99 mg/dL    Comment: Glucose reference range applies only to samples taken after fasting for at least 8 hours.   BUN 10 6 - 20 mg/dL   Creatinine, Ser 4.09 0.61 - 1.24 mg/dL   Calcium 9.5 8.9 - 81.1 mg/dL   Total Protein 6.4 (L) 6.5 - 8.1 g/dL   Albumin 3.2 (L) 3.5 - 5.0 g/dL   AST 19 15 - 41 U/L   ALT 18 0 - 44 U/L   Alkaline Phosphatase 67 38 - 126 U/L   Total Bilirubin 0.5 <1.2 mg/dL   GFR, Estimated >91 >47 mL/min    Comment: (NOTE) Calculated using the CKD-EPI Creatinine Equation (2021)    Anion gap 14 5 - 15    Comment: Performed at [facility], [address], [place], [place] [address]  Urine rapid drug screen (hosp performed)     Status: None   Collection Time: 06/12/23  3:46 PM  Result Value Ref Range   Opiates NONE DETECTED NONE DETECTED   Cocaine NONE DETECTED NONE DETECTED   Benzodiazepines NONE DETECTED NONE DETECTED   Amphetamines NONE DETECTED NONE DETECTED   Tetrahydrocannabinol NONE DETECTED NONE DETECTED   Barbiturates NONE DETECTED NONE DETECTED    Comment: (NOTE) DRUG SCREEN FOR MEDICAL PURPOSES ONLY.  IF CONFIRMATION IS NEEDED FOR ANY PURPOSE, NOTIFY LAB WITHIN 5 DAYS.  LOWEST DETECTABLE LIMITS FOR URINE DRUG SCREEN Drug Class                     Cutoff (ng/mL) Amphetamine and metabolites    1000 Barbiturate and metabolites    200 Benzodiazepine                 200 Opiates and metabolites        300 Cocaine and metabolites        300 THC                            50 Performed at [facility], [address], [place], [place] [address]   Urinalysis, Routine w reflex microscopic -Urine, Clean Catch     Status: Abnormal   Collection Time: 06/12/23  3:46 PM  Result Value Ref Range   Color, Urine STRAW (A) YELLOW   APPearance CLEAR CLEAR   Specific Gravity, Urine 1.026 1.005 - 1.030   pH 5.0 5.0 - 8.0   Glucose, UA >=500 (A) NEGATIVE mg/dL   Hgb urine dipstick SMALL (A) NEGATIVE   Bilirubin Urine NEGATIVE NEGATIVE   Ketones, ur NEGATIVE NEGATIVE mg/dL   Protein, ur NEGATIVE NEGATIVE mg/dL   Nitrite NEGATIVE NEGATIVE   Leukocytes,Ua NEGATIVE NEGATIVE   RBC / HPF 0-5 0 - 5 RBC/hpf   WBC, UA 0-5 0 - 5 WBC/hpf   Bacteria, UA NONE SEEN NONE SEEN   Squamous Epithelial / HPF 0-5 0 - 5 /HPF    Comment: Performed at [facility], [address], [place], [place] [address]  Brain natriuretic peptide     Status: Abnormal   Collection Time: 06/12/23  5:15 PM  Result Value Ref Range  B Natriuretic Peptide 118.4 (H) 0.0 - 100.0 pg/mL    Comment: Performed at [facility], [address], [place], [place] [address]  Troponin I (High Sensitivity)     Status: None   Collection Time: 06/12/23  5:15 PM  Result Value Ref Range   Troponin I (High Sensitivity) 15 <18 ng/L    Comment: (NOTE) Elevated high sensitivity troponin I (hsTnI) values and significant  changes across serial measurements may suggest ACS but many other  chronic and acute conditions are known to elevate hsTnI results.  Refer to the "Links" section for chest pain algorithms and additional  guidance. Performed at [facility], [address], [place], [place] [address]   Troponin I (High Sensitivity)     Status: None   Collection Time: 06/12/23  6:40 PM  Result Value Ref Range   Troponin I (High Sensitivity) 17 <18 ng/L    Comment: (NOTE) Elevated high sensitivity troponin I (hsTnI) values and significant  changes across serial measurements may suggest ACS but many other  chronic and acute conditions are known to  elevate hsTnI results.  Refer to the "Links" section for chest pain algorithms and additional  guidance. Performed at [facility], [address], [place], [place] [address]   POC CBG, ED     Status: Abnormal   Collection Time: 06/12/23  6:46 PM  Result Value Ref Range   Glucose-Capillary 254 (H) 70 - 99 mg/dL    Comment: Glucose reference range applies only to samples taken after fasting for at least 8 hours.   DG Chest Port 1 View Result Date: 06/12/2023 CLINICAL DATA:  Shortness of breath EXAM: PORTABLE CHEST 1 VIEW COMPARISON:  12/07/2022 FINDINGS: Left-sided multi lead pacing device as before. Low lung volumes. Cardiomegaly with central broncho vascular crowding but suspected slight central congestion. No pleural effusion or pneumothorax IMPRESSION: Low lung volumes with cardiomegaly and suspected slight central congestion. Electronically Signed   By: Jasmine Pang M.D.   On: 06/12/2023 18:04   CT Head Wo Contrast Result Date: 06/12/2023 CLINICAL DATA:  Headache, new onset (Age >= 51y) Headache, increasing frequency or severity R sided weakness EXAM: CT HEAD WITHOUT CONTRAST TECHNIQUE: Contiguous axial images were obtained from the base of the skull through the vertex without intravenous contrast. RADIATION DOSE REDUCTION: This exam was performed according to the departmental dose-optimization program which includes automated exposure control, adjustment of the mA and/or kV according to patient size and/or use of iterative reconstruction technique. COMPARISON:  CT head April 29, 24. FINDINGS: Brain: No evidence of acute infarction, hemorrhage, hydrocephalus, extra-axial collection or mass lesion/mass effect. Vascular: No hyperdense vessel or unexpected calcification. Skull: No acute fracture. Sinuses/Orbits: Left maxillary sinus mucosal thickening. No acute orbital findings. Other: Chronic nonspecific soft tissue thickening the suboccipital region. IMPRESSION: No evidence of acute  intracranial abnormality. Electronically Signed   By: Feliberto Harts M.D.   On: 06/12/2023 17:19    Pending Labs Unresulted Labs (From admission, onward)     Start     Ordered   06/13/23 0500  Lipid panel  (Labs)  Tomorrow morning,   R       Comments: Fasting    06/12/23 2154   06/12/23 2152  HIV Antibody (routine testing w rflx)  (HIV Antibody (Routine testing w reflex) panel)  Once,   R        06/12/23 2154   06/12/23 2152  Hemoglobin A1c  (Labs)  Once,   R  Comments: To assess prior glycemic control    06/12/23 2154            Vitals/Pain Today's Vitals   06/12/23 2041 06/12/23 2100 06/12/23 2130 06/12/23 2200  BP:  (!) 112/50 (!) 108/37 (!) 141/77  Pulse:  81 86 88  Resp:  15 14 15   Temp:  98.4 F (36.9 C)    TempSrc:      SpO2:  99% 98% 98%  Weight:      Height:      PainSc: Asleep       Isolation Precautions No active isolations  Medications Medications   stroke: early stages of recovery book (has no administration in time range)  acetaminophen (TYLENOL) tablet 650 mg (has no administration in time range)    Or  acetaminophen (TYLENOL) 160 MG/5ML solution 650 mg (has no administration in time range)    Or  acetaminophen (TYLENOL) suppository 650 mg (has no administration in time range)  senna-docusate (Senokot-S) tablet 1 tablet (has no administration in time range)  enoxaparin (LOVENOX) injection 40 mg (has no administration in time range)  bumetanide (BUMEX) tablet 2 mg (has no administration in time range)  carvedilol (COREG) tablet 3.125 mg (has no administration in time range)  digoxin (LANOXIN) tablet 0.125 mg (has no administration in time range)  rosuvastatin (CRESTOR) tablet 40 mg (has no administration in time range)  spironolactone (ALDACTONE) tablet 25 mg (has no administration in time range)  hydrOXYzine (ATARAX) tablet 25 mg (has no administration in time range)  traZODone (DESYREL) tablet 300 mg (has no administration in time  range)  pantoprazole (PROTONIX) EC tablet 40 mg (has no administration in time range)  clopidogrel (PLAVIX) tablet 75 mg (has no administration in time range)  potassium chloride SA (KLOR-CON M) CR tablet 20 mEq (has no administration in time range)  insulin aspart (novoLOG) injection 6 Units (6 Units Subcutaneous Given 06/12/23 1715)  acetaminophen (TYLENOL) tablet 1,000 mg (1,000 mg Oral Given 06/12/23 1715)  morphine (PF) 4 MG/ML injection 4 mg (4 mg Intravenous Given 06/12/23 1849)    Mobility walks with device     Focused Assessments     R Recommendations: See Admitting Provider Note  Report given to:   Additional Notes: from home / A&O / urinal / right sided weakness /

## 2023-06-12 NOTE — H&P (Signed)
History and Physical    Patient: Luis [name] NFA:[id number] DOB: 1971-03-30 DOA: 06/12/2023 DOS: the patient was seen and examined on 06/12/2023 PCP: [facility]  Patient coming from: Home  Chief Complaint:  Chief Complaint  Patient presents with   Weakness   HPI: Luis [name] is a right handed 52 y.o. male with medical history significant of Combined CHF, ICD placement, VT, HTN, OSA on CPAP, chronic hypoxic respiratory failure on 2L, GERD, Anxiety, polysubstance abuse (tobacco, cocaine), T2DM, Morbid obesity who presents with right sided weakness.   Reports a left sided occipital/parietal headache for 3 days and then in past 2 days developed right sided upper and lower extremity weakness. Then today fell because his right leg gave out on him causing him to fall and hit his right forehead on a dresser. Says blood pressure has been high in the SBP 200s and has noted his glucose up to 500s. Also notes numbness to his feet. Has been nauseous but no vomiting. Has low appetite. Reports compliance with his Plavix.   He has a similar episode of right sided weakness in April and had negative CT head x2, MRA head and brain. Only thing positive for UDS for cocaine. There was documented concern for behavioral component conversion disorder to his exam as right sided weakness was not consistent with every exam. However due to his risk factors he was placed on Plavix.   On arrival to ED, he was afebrile and normotensive on room air.  CBC unremarkable BMP remarkable only for hyperglycemia 421 with no anion gap.  UA was negative.  Chest x-ray with cardiomegaly and slight central congestion. UDS negative EtOH of less than 10  CT head negative. Review of Systems: As mentioned in the history of present illness. All other systems reviewed and are negative. Past Medical History:  Diagnosis Date   Anxiety    Biventricular ICD (implantable cardioverter-defibrillator) in place     Chronic systolic CHF (congestive heart failure) (HCC)    Cocaine use    Diabetes mellitus without complication (HCC)    GERD (gastroesophageal reflux disease)    HLD (hyperlipidemia)    Hypertension    Morbid obesity (HCC)    NICM (nonischemic cardiomyopathy) (HCC)    OSA (obstructive sleep apnea)    PTSD (post-traumatic stress disorder)    on Depakote   Refusal of blood transfusions as patient is Jehovah's Witness    Tobacco abuse    Past Surgical History:  Procedure Laterality Date   BIV ICD INSERTION CRT-D     FOOT FRACTURE SURGERY     ICD GENERATOR CHANGEOUT N/A 03/23/2022   Procedure: ICD GENERATOR CHANGEOUT;  Surgeon: Nelly Laurence, Roberts Gaudy, MD;  Location: [facility] INVASIVE CV LAB;  Service: Cardiovascular;  Laterality: N/A;   RIGHT HEART CATH N/A 03/06/2022   Procedure: RIGHT HEART CATH;  Surgeon: Orbie Pyo, MD;  Location: [facility] INVASIVE CV LAB;  Service: Cardiovascular;  Laterality: N/A;   RIGHT/LEFT HEART CATH AND CORONARY ANGIOGRAPHY N/A 04/16/2022   Procedure: RIGHT/LEFT HEART CATH AND CORONARY ANGIOGRAPHY;  Surgeon: Laurey Morale, MD;  Location: [facility] INVASIVE CV LAB;  Service: Cardiovascular;  Laterality: N/A;   ROTATOR CUFF REPAIR     TONSILLECTOMY     Social History:  reports that he has been smoking cigarettes. He started smoking about 16 years ago. He has a 7.5 pack-year smoking history. He has never used smokeless tobacco. He reports that he does not currently use alcohol. He reports current drug use.  Drugs: Cocaine and Marijuana.  Allergies  Allergen Reactions   Aspirin Anaphylaxis and Other (See Comments)    Throat closing    Iodine-131 Hives   Peanut (Diagnostic) Anaphylaxis    Throat closes   Iodinated Contrast Media Hives    Can be premedicated    Latex Hives and Rash   Penicillins Nausea And Vomiting   Ultram [Tramadol] Hives and Nausea Only   Zestril [Lisinopril] Cough   Cinnamon    Amoxicillin-Pot Clavulanate Diarrhea   Lidocaine Rash    Rash from  adhesive on lidocaine patches     Family History  Problem Relation Age of Onset   Hypertension Mother    Bone cancer Father    Lung cancer Father     Prior to Admission medications   Medication Sig Start Date End Date Taking? Authorizing Provider  acetaminophen (TYLENOL) 325 MG tablet Take 2 tablets (650 mg total) by mouth every 6 (six) hours as needed for mild pain. 10/26/22  Yes Mabe, Earvin Hansen, MD  bumetanide (BUMEX) 2 MG tablet Take 2 mg by mouth 2 (two) times daily. 04/26/23 07/25/23 Yes [provider]  carvedilol (COREG) 3.125 MG tablet Take 3.125 mg by mouth 2 (two) times daily with a meal.   Yes [provider]  clopidogrel (PLAVIX) 75 MG tablet Take 1 tablet (75 mg total) by mouth daily. 10/28/22  Yes Carrion-Carrero, Karle Starch, MD  dapagliflozin propanediol (FARXIGA) 10 MG TABS tablet Take 1 tablet (10 mg total) by mouth daily. 05/25/22  Yes Laurey Morale, MD  digoxin (LANOXIN) 0.125 MG tablet Take 0.125 mg by mouth daily.   Yes [provider]  hydrOXYzine (ATARAX) 25 MG tablet Take 1 tablet (25 mg total) by mouth 3 (three) times daily as needed for anxiety. 09/03/21  Yes Nwoko, Uchenna E, PA  insulin aspart protamine- aspart (NOVOLOG MIX 70/30) (70-30) 100 UNIT/ML injection Inject 0.7 mLs (70 Units total) into the skin 2 (two) times daily with breakfast and lunch. Patient taking differently: Inject 70 Units into the skin 2 (two) times daily with a meal. 10/17/22 06/12/23 Yes Karie Fetch, MD  insulin glargine (LANTUS) 100 UNIT/ML injection Inject 25 Units into the skin at bedtime.   Yes [provider]  insulin glargine-yfgn (SEMGLEE) 100 UNIT/ML injection Inject 0.2 mLs (20 Units total) into the skin at bedtime. 10/06/22  Yes Ardis Hughs, NP  isoniazid (NYDRAZID) 300 MG tablet Take 1 tablet by mouth daily. 03/18/23  Yes [provider]  pantoprazole (PROTONIX) 40 MG tablet Take 40 mg by mouth daily. 04/05/23  Yes [provider]  potassium chloride SA (KLOR-CON M) 20 MEQ tablet Take 20 mEq by mouth daily.   Yes [provider]  prazosin (MINIPRESS) 2 MG capsule Take 1 capsule (2 mg total) by mouth at bedtime. 09/03/21  Yes Nwoko, Uchenna E, PA  rosuvastatin (CRESTOR) 40 MG tablet Take 1 tablet (40 mg total) by mouth daily. 10/27/22  Yes Mabe, Earvin Hansen, MD  sacubitril-valsartan (ENTRESTO) 97-103 MG Take 1 tablet by mouth 2 (two) times daily. 10/06/22  Yes Ardis Hughs, NP  spironolactone (ALDACTONE) 25 MG tablet Take 1 tablet (25 mg total) by mouth daily. 10/07/22  Yes Ardis Hughs, NP  torsemide (DEMADEX) 20 MG tablet Take 3 tablets (60 mg total) by mouth daily. 05/25/22  Yes Laurey Morale, MD  traZODone (DESYREL) 300 MG tablet Take 1 tablet (300 mg total) by mouth at bedtime as needed for sleep. Patient taking differently: Take 300  mg by mouth at bedtime. 10/17/22 06/12/23 Yes Karie Fetch, MD  ARIPiprazole (ABILIFY) 5 MG tablet Take 1 tablet (5 mg total) by mouth daily. 10/18/22 11/17/22  Karie Fetch, MD  DULoxetine (CYMBALTA) 30 MG capsule Take 3 capsules (90 mg total) by mouth daily. 10/18/22 11/17/22  Karie Fetch, MD    Physical Exam: Vitals:   06/12/23 2100 06/12/23 2130 06/12/23 2200 06/12/23 2300  BP: (!) 112/50 (!) 108/37 (!) 141/77 132/79  Pulse: 81 86 88 79  Resp: 15 14 15 12   Temp: 98.4 F (36.9 C)     TempSrc:      SpO2: 99% 98% 98% 97%  Weight:      Height:       Constitutional: NAD, calm, comfortable, morbidly obese male laying slumped over on his right side.  He had difficulty moving himself back upright in bed since he was not able to use his right side. Eyes:  lids and conjunctivae normal ENMT: Mucous membranes are moist.  Neck: normal, supple Respiratory: clear to auscultation bilaterally, no wheezing, no crackles. Normal respiratory effort. No accessory muscle use.  Cardiovascular: Regular rate and rhythm, no murmurs / rubs / gallops. No extremity edema.    Abdomen: no tenderness, soft Musculoskeletal: no clubbing / cyanosis. No joint deformity upper and lower extremities. . Normal muscle tone.  Skin: no rashes, lesions, ulcers. No induration Neurologic: CN 2-12 grossly intact.  No facial asymmetry.  Able to lift right upper and lower extremity minimally against gravity but not against resistance.  Attempted to use his left hand to help pull up his right.  Also attempted to use his left leg to help lift up his right leg.  Unable to perform finger-to-nose on right hand. Psychiatric:  Normal mood.   Data Reviewed:  See HPI  Assessment and Plan: * Right sided weakness -pt presented with about 3 days of right sided weakness. On exam he is minimally able to lift his upper and lower extremity against gravity. He interestingly had a similar presentation in April and neurology was consulted. His exam was found to be inconsistent and there was a concern for conversion disorder. He was however ultimately placed on Plavix due to his risk factors and anaphylactic reaction to aspirin.  -CT head is negative -pt with ICD that will not be able to be turned off for MRI until Monday which is 2 days from now. Case discussed with Neurology Dr. Otelia Limes and he will evaluate and give further recommendation on whether he may just need repeat CT head in the morning.  -PT/OT -continue Plavix  Chronic respiratory failure with hypoxia (HCC) - 2.5 L/min 2.5L via [place] as needed  Sleep apnea -Continue night time CPAP  Morbid obesity (HCC) BMI of 50  Diabetes (HCC) -uncontrolled with hyperglycemia. Last A1C of 11.8 in Nov -reports taking semglee, Novolog 70/30 and Lantus at home -start with 20 units semglee at bedtime and SSI    HLD (hyperlipidemia) -Continue statin  Combined systolic and diastolic heart failure (HCC) -Last echocardiogram on 02/2022 with a EF of 25%, global hypokinesis of the left ventricle, indeterminate diastolic function.  No significant valvular  abnormalities -continue Bumex, digoxin, beta blocker -s/p ICD      Advance Care Planning:Full  Consults: neurology   Family Communication: none at bedside  Severity of Illness: The appropriate patient status for this patient is OBSERVATION. Observation status is judged to be reasonable and necessary in order to provide the required intensity of service to ensure the patient's  safety. The patient's presenting symptoms, physical exam findings, and initial radiographic and laboratory data in the context of their medical condition is felt to place them at decreased risk for further clinical deterioration. Furthermore, it is anticipated that the patient will be medically stable for discharge from the hospital within 2 midnights of admission.   Author: Anselm Jungling, DO 06/12/2023 11:26 PM  For on call review [email or web address].

## 2023-06-12 NOTE — Assessment & Plan Note (Addendum)
-  uncontrolled with hyperglycemia. Last A1C of 11.8 in Nov -reports taking semglee, Novolog 70/30 and Lantus at home -start with 20 units semglee at bedtime and SSI

## 2023-06-12 NOTE — Assessment & Plan Note (Signed)
BMI of 50

## 2023-06-12 NOTE — Assessment & Plan Note (Signed)
Continue nighttime CPAP 

## 2023-06-12 NOTE — ED Triage Notes (Addendum)
PER EMS: pt is from home with c/o right sided weakness and decreased sensation to entire right side of his body that started 5 days ago. He reports he fell today due to this weakness and hit his head on the dresser. Denies LOC, but he does take Plavix.He reports blurry vision & having a headache but this started before his fall His CBG was 518 with ems  BP-181/100, HR- 102 paced rhythm, O2-99% on chronic 2L [place]. He received NS en route.

## 2023-06-12 NOTE — Assessment & Plan Note (Signed)
Continue statin. 

## 2023-06-12 NOTE — ED Notes (Signed)
This RN called over to CT to see how many other patients are ahead of him and CT tech stated there are 4 patients ahead of him at this time.

## 2023-06-12 NOTE — ED Provider Notes (Signed)
[place] EMERGENCY DEPARTMENT AT [facility] Provider Note   CSN: [id number] Arrival date & time: 06/12/23  1524     History {Add pertinent medical, surgical, social history, OB history to HPI:1} Chief Complaint  Patient presents with   Weakness    Luis [name] is a 52 y.o. male.  52 year old male with prior medical history as detailed below presents for evaluation.  Patient reports multiple complaints.  He complains approximately 4 to 5 days of left-sided headache, right-sided weakness.  Patient reports increased shortness of breath especially when laying flat.  He reports some increased lower extremity edema as well.  He reports that his blood pressures have been as high as 200 systolic at home.  He reports that his sugars has been elevated into the 4 and 500s despite reported compliance with medications.  Patient reports prior stroke with mild right-sided residual deficit.  He reports that he fall that occurred approximately 4 days ago with mild head injury at the time.  Prior medical history significant for cocaine use disorder, OSA, T2DM, HTN HFrEF with BivICD, and MDD.  The history is provided by the patient and medical records.       Home Medications Prior to Admission medications   Medication Sig Start Date End Date Taking? Authorizing Provider  acetaminophen (TYLENOL) 325 MG tablet Take 2 tablets (650 mg total) by mouth every 6 (six) hours as needed for mild pain. 10/26/22   Evette Georges, MD  ARIPiprazole (ABILIFY) 5 MG tablet Take 1 tablet (5 mg total) by mouth daily. 10/18/22 11/17/22  Karie Fetch, MD  carvedilol (COREG) 3.125 MG tablet Take 3.125 mg by mouth 2 (two) times daily with a meal.    [provider]  clopidogrel (PLAVIX) 75 MG tablet Take 1 tablet (75 mg total) by mouth daily. 10/28/22   Carrion-Carrero, Karle Starch, MD  dapagliflozin propanediol (FARXIGA) 10 MG TABS tablet Take 1 tablet (10 mg total) by mouth daily. 05/25/22    Laurey Morale, MD  digoxin (LANOXIN) 0.125 MG tablet Take 0.125 mg by mouth daily.    [provider]  DULoxetine (CYMBALTA) 30 MG capsule Take 3 capsules (90 mg total) by mouth daily. 10/18/22 11/17/22  Karie Fetch, MD  gabapentin (NEURONTIN) 300 MG capsule Take 1 capsule (300 mg total) by mouth 3 (three) times daily. 10/17/22 11/16/22  Karie Fetch, MD  hydrOXYzine (ATARAX) 25 MG tablet Take 1 tablet (25 mg total) by mouth 3 (three) times daily as needed for anxiety. 09/03/21   Nwoko, Tommas Olp, PA  insulin aspart protamine- aspart (NOVOLOG MIX 70/30) (70-30) 100 UNIT/ML injection Inject 0.7 mLs (70 Units total) into the skin 2 (two) times daily with breakfast and lunch. 10/17/22 11/16/22  Karie Fetch, MD  insulin glargine-yfgn (SEMGLEE) 100 UNIT/ML injection Inject 0.2 mLs (20 Units total) into the skin at bedtime. 10/06/22   Ardis Hughs, NP  potassium chloride SA (KLOR-CON M) 20 MEQ tablet Take 20 mEq by mouth daily.    [provider]  prazosin (MINIPRESS) 2 MG capsule Take 1 capsule (2 mg total) by mouth at bedtime. 09/03/21   Nwoko, Tommas Olp, PA  rosuvastatin (CRESTOR) 40 MG tablet Take 1 tablet (40 mg total) by mouth daily. 10/27/22   Evette Georges, MD  sacubitril-valsartan (ENTRESTO) 97-103 MG Take 1 tablet by mouth 2 (two) times daily. 10/06/22   Ardis Hughs, NP  spironolactone (ALDACTONE) 25 MG tablet Take 1 tablet (25 mg total) by mouth daily. 10/07/22   Effie Shy,  Liane Comber, NP  torsemide (DEMADEX) 20 MG tablet Take 3 tablets (60 mg total) by mouth daily. 05/25/22   Laurey Morale, MD  traZODone (DESYREL) 300 MG tablet Take 1 tablet (300 mg total) by mouth at bedtime as needed for sleep. 10/17/22 11/16/22  Karie Fetch, MD      Allergies    Aspirin, Iodine-131, Peanut (diagnostic), Iodinated contrast media, Latex, Penicillins, Ultram [tramadol], Zestril [lisinopril], Cinnamon, Amoxicillin-pot clavulanate, and Lidocaine    Review of Systems   Review  of Systems  All other systems reviewed and are negative.   Physical Exam Updated Vital Signs BP 139/71 (BP Location: Right Arm)   Pulse 89   Temp 98.5 F (36.9 C) (Oral)   Resp 17   Ht 5\' 5"  (1.651 m)   Wt (!) 138.3 kg   SpO2 97%   BMI 50.75 kg/m  Physical Exam Vitals and nursing note reviewed.  Constitutional:      General: He is not in acute distress.    Appearance: Normal appearance. He is well-developed.  HENT:     Head: Normocephalic and atraumatic.  Eyes:     Conjunctiva/sclera: Conjunctivae normal.     Pupils: Pupils are equal, round, and reactive to light.  Cardiovascular:     Rate and Rhythm: Normal rate and regular rhythm.     Heart sounds: Normal heart sounds.  Pulmonary:     Effort: Pulmonary effort is normal. No respiratory distress.     Comments: Decreased BS at bases Bilaterally  Abdominal:     General: There is no distension.     Palpations: Abdomen is soft.     Tenderness: There is no abdominal tenderness.  Musculoskeletal:        General: No deformity. Normal range of motion.     Cervical back: Normal range of motion and neck supple.     Right lower leg: Edema present.     Left lower leg: Edema present.  Skin:    General: Skin is warm and dry.  Neurological:     Mental Status: He is alert and oriented to person, place, and time.     Comments: Alert, oriented x 4, normal speech, reports mild weakness of the right arm and right leg.  Patient with 4/5 strength to RUE and RLE, 5/5 LUE/LLE     ED Results / Procedures / Treatments   Labs (all labs ordered are listed, but only abnormal results are displayed) Labs Reviewed  CBG MONITORING, ED - Abnormal; Notable for the following components:      Result Value   Glucose-Capillary 407 (*)    All other components within normal limits  PROTIME-INR  APTT  CBC  DIFFERENTIAL  ETHANOL  COMPREHENSIVE METABOLIC PANEL  RAPID URINE DRUG SCREEN, HOSP PERFORMED  URINALYSIS, ROUTINE W REFLEX MICROSCOPIC     EKG None  Radiology No results found.  Procedures Procedures  {Document cardiac monitor, telemetry assessment procedure when appropriate:1}  Medications Ordered in ED Medications - No data to display  ED Course/ Medical Decision Making/ A&P   {   Click here for ABCD2, HEART and other calculatorsREFRESH Note before signing :1}                              Medical Decision Making Amount and/or Complexity of Data Reviewed Labs: ordered. Radiology: ordered.  Risk OTC drugs. Prescription drug management.    Medical Screen Complete  This patient presented to the  ED with complaint of ***.  This complaint involves an extensive number of treatment options. The initial differential diagnosis includes, but is not limited to, ***  This presentation is: {IllnessRisk:19196::"***","Acute","Chronic","Self-Limited","Previously Undiagnosed","Uncertain Prognosis","Complicated","Systemic Symptoms","Threat to Life/Bodily Function"}    Co morbidities that complicated the patient's evaluation  ***   Additional history obtained:  Additional history obtained from {History source:19196::"EMS","Spouse","Family","Friend","Caregiver"} External records from outside sources obtained and reviewed including prior ED visits and prior Inpatient records.    Lab Tests:  I ordered and personally interpreted labs.  The pertinent results include:  ***   Imaging Studies ordered:  I ordered imaging studies including ***  I independently visualized and interpreted obtained imaging which showed *** I agree with the radiologist interpretation.   Cardiac Monitoring:  The patient was maintained on a cardiac monitor.  I personally viewed and interpreted the cardiac monitor which showed an underlying rhythm of: ***   Medicines ordered:  I ordered medication including ***  for ***  Reevaluation of the patient after these medicines showed that the patient:  {resolved/improved/worsened:23923::"improved"}    Test Considered:  ***   Critical Interventions:  ***   Consultations Obtained:  I consulted ***,  and discussed lab and imaging findings as well as pertinent plan of care.    Problem List / ED Course:  ***   Reevaluation:  After the interventions noted above, I reevaluated the patient and found that they have: {resolved/improved/worsened:23923::"improved"}   Social Determinants of Health:  ***   Disposition:  After consideration of the diagnostic results and the patients response to treatment, I feel that the patent would benefit from ***.    {Document critical care time when appropriate:1} {Document review of labs and clinical decision tools ie heart score, Chads2Vasc2 etc:1}  {Document your independent review of radiology images, and any outside records:1} {Document your discussion with family members, caretakers, and with consultants:1} {Document social determinants of health affecting pt's care:1} {Document your decision making why or why not admission, treatments were needed:1} Final Clinical Impression(s) / ED Diagnoses Final diagnoses:  None    Rx / DC Orders ED Discharge Orders     None

## 2023-06-12 NOTE — Assessment & Plan Note (Addendum)
-  Last echocardiogram on 02/2022 with a EF of 25%, global hypokinesis of the left ventricle, indeterminate diastolic function.  No significant valvular abnormalities -continue Bumex, digoxin, beta blocker -s/p ICD

## 2023-06-12 NOTE — ED Provider Triage Note (Signed)
Emergency Medicine Provider Triage Evaluation Note  Luis [name] , a 52 y.o. male  was evaluated in triage.  Pt complains of fall with head injury.  In addition he has had approximately 4 to 5 days of left-sided headache, gradual onset, with right-sided weakness over the past couple of days.  Difficulty with weakness in his right arm and leg led to him falling today.  He is on chronic oxygen.  He has a pacemaker for congestive heart failure.  Reports pitting to his lower extremities.  Also reports uncontrolled blood pressures at home.  Review of Systems  Positive: Headache, weakness Negative: Fever  Physical Exam  BP 139/71 (BP Location: Right Arm)   Pulse 89   Temp 98.5 F (36.9 C) (Oral)   Resp 17   Ht 5\' 5"  (1.651 m)   Wt (!) 138.3 kg   SpO2 97%   BMI 50.75 kg/m  Gen:   Awake, no distress   Resp:  Normal effort  MSK:   Moves extremities without difficulty  Other:  Grossly decreased strength right arm and right leg compared to the left  Medical Decision Making  Medically screening exam initiated at 3:37 PM.  Appropriate orders placed.  Luis [name] was informed that the remainder of the evaluation will be completed by another provider, this initial triage assessment does not replace that evaluation, and the importance of remaining in the ED until their evaluation is complete.     Renne Crigler, PA-C 06/12/23 1538

## 2023-06-12 NOTE — Assessment & Plan Note (Signed)
2.5L via [place] as needed

## 2023-06-12 NOTE — Assessment & Plan Note (Addendum)
-  pt presented with about 3 days of right sided weakness. On exam he is minimally able to lift his upper and lower extremity against gravity. He interestingly had a similar presentation in April and neurology was consulted. His exam was found to be inconsistent and there was a concern for conversion disorder. He was however ultimately placed on Plavix due to his risk factors and anaphylactic reaction to aspirin.  -CT head is negative -pt with ICD that will not be able to be turned off for MRI until Monday which is 2 days from now. Case discussed with Neurology Dr. Otelia Limes and he will evaluate and give further recommendation on whether he may just need repeat CT head in the morning.  -PT/OT -continue Plavix

## 2023-06-13 DIAGNOSIS — I639 Cerebral infarction, unspecified: Secondary | ICD-10-CM | POA: Diagnosis not present

## 2023-06-13 DIAGNOSIS — R531 Weakness: Secondary | ICD-10-CM | POA: Diagnosis not present

## 2023-06-13 LAB — GLUCOSE, CAPILLARY
Glucose-Capillary: 309 mg/dL — ABNORMAL HIGH (ref 70–99)
Glucose-Capillary: 366 mg/dL — ABNORMAL HIGH (ref 70–99)
Glucose-Capillary: 315 mg/dL — ABNORMAL HIGH (ref 70–99)
Glucose-Capillary: 420 mg/dL — ABNORMAL HIGH (ref 70–99)

## 2023-06-13 LAB — HEMOGLOBIN A1C
Hgb A1c MFr Bld: 12 % — ABNORMAL HIGH (ref 4.8–5.6)
Mean Plasma Glucose: 297.7 mg/dL

## 2023-06-13 LAB — LIPID PANEL
Cholesterol: 160 mg/dL (ref 0–200)
HDL: 46 mg/dL (ref >40)
LDL Cholesterol: 72 mg/dL (ref 0–99)
Total CHOL/HDL Ratio: 3.5 {ratio}
Triglycerides: 211 mg/dL — ABNORMAL HIGH (ref <150)
VLDL: 42 mg/dL — ABNORMAL HIGH (ref 0–40)

## 2023-06-13 LAB — HIV ANTIBODY (ROUTINE TESTING W REFLEX): HIV Screen 4th Generation wRfx: NONREACTIVE

## 2023-06-13 MED ORDER — NYSTATIN 100000 UNIT/GM EX CREA
TOPICAL_CREAM | Freq: Two times a day (BID) | CUTANEOUS | Status: DC
  Administered 2023-06-13: 1 via TOPICAL
  Filled 2023-06-13: qty 30

## 2023-06-13 MED ORDER — HYDROCODONE-ACETAMINOPHEN 5-325 MG PO TABS
1.0000 | ORAL_TABLET | ORAL | Status: DC | PRN
  Administered 2023-06-13 (×2): 1 via ORAL
  Filled 2023-06-13 (×2): qty 1

## 2023-06-13 MED ORDER — HYDROMORPHONE HCL 1 MG/ML IJ SOLN
0.5000 mg | INTRAMUSCULAR | Status: AC | PRN
  Administered 2023-06-13 – 2023-06-14 (×2): 0.5 mg via INTRAVENOUS
  Filled 2023-06-13 (×2): qty 0.5

## 2023-06-13 MED ORDER — SUMATRIPTAN SUCCINATE 25 MG PO TABS: 25.0000 mg | ORAL_TABLET | Freq: Once | ORAL | Status: DC

## 2023-06-13 NOTE — Evaluation (Addendum)
Occupational Therapy Evaluation Patient Details Name: Luis [name] MRN: [id number] DOB: 1970-11-02 Today's Date: 06/13/2023   History of Present Illness Pt is a 52 y/o M presenting to ED from home with R sided weakness and decr snesation x5 days. Fell and hit head on dresser, blurred visioin. CTH negative, MRI pending. PMH includes combined CHF, ICD placement, VT, HTN, OSA on CPAP, PTSD, chronic hypoxic respiratory failure on 2.5L, GERD, anxiety, polysubstance abuse, DM2, morbid obesity   Clinical Impression   Pt living alone, ind at baseline with ADLs and uses rollator for mobility. Reports sometimes his friend comes over to assist with household tasks. Pt currently needing set up - max A for ADLs, min A for bed mobility and mod+2 for transfer to chair with RW. Pt with RUE/RLE weakness and decr sensation, also notes blurred vision but functional for BADL. Pt presenting with impairments listed below, will follow acutely. Patient will benefit from intensive inpatient follow up therapy, >3 hours/day to maximize safety/ind with ADL/functional mobility.       If plan is discharge home, recommend the following: Two people to help with walking and/or transfers;A lot of help with bathing/dressing/bathroom;Assistance with cooking/housework;Direct supervision/assist for medications management;Direct supervision/assist for financial management;Assist for transportation    Functional Status Assessment  Patient has had a recent decline in their functional status and demonstrates the ability to make significant improvements in function in a reasonable and predictable amount of time.  Equipment Recommendations  Other (comment) (defer)    Recommendations for Other Services PT consult;Rehab consult     Precautions / Restrictions Precautions Precautions: Fall Precaution Comments: 2.5L O2 baseline Restrictions Weight Bearing Restrictions Per Provider Order: No      Mobility Bed  Mobility Overal bed mobility: Needs Assistance Bed Mobility: Supine to Sit     Supine to sit: Min assist     General bed mobility comments: assist for trunk elevation    Transfers Overall transfer level: Needs assistance Equipment used: Rolling walker (2 wheels) Transfers: Sit to/from Stand Sit to Stand: Mod assist, +2 physical assistance                  Balance Overall balance assessment: Needs assistance Sitting-balance support: Feet supported Sitting balance-Leahy Scale: Fair     Standing balance support: During functional activity, Reliant on assistive device for balance Standing balance-Leahy Scale: Poor Standing balance comment: reliant on external support                           ADL either performed or assessed with clinical judgement   ADL Overall ADL's : Needs assistance/impaired Eating/Feeding: Set up;Sitting   Grooming: Set up;Sitting   Upper Body Bathing: Moderate assistance;Minimal assistance   Lower Body Bathing: Maximal assistance   Upper Body Dressing : Minimal assistance   Lower Body Dressing: Maximal assistance   Toilet Transfer: Moderate assistance;+2 for physical assistance   Toileting- Clothing Manipulation and Hygiene: Minimal assistance       Functional mobility during ADLs: Moderate assistance;+2 for physical assistance;Rolling walker (2 wheels)       Vision   Vision Assessment?: No apparent visual deficits Additional Comments: reports blurred vision in R eye     Perception Perception: Not tested       Praxis Praxis: Not tested       Pertinent Vitals/Pain Pain Assessment Pain Assessment: Faces Pain Score: 10-Worst pain ever Faces Pain Scale: Hurts worst Pain Location: head Pain Descriptors / Indicators: Headache  Pain Intervention(s): Limited activity within patient's tolerance, Monitored during session, Repositioned     Extremity/Trunk Assessment Upper Extremity Assessment Upper Extremity  Assessment: Right hand dominant;RUE deficits/detail RUE Deficits / Details: 3/5 globally, decr FMC RUE Sensation: decreased light touch;decreased proprioception RUE Coordination: decreased fine motor;decreased gross motor   Lower Extremity Assessment Lower Extremity Assessment: Defer to PT evaluation   Cervical / Trunk Assessment Cervical / Trunk Assessment: Normal   Communication Communication Communication: No apparent difficulties   Cognition Arousal: Alert Behavior During Therapy: WFL for tasks assessed/performed Overall Cognitive Status: Within Functional Limits for tasks assessed                                 General Comments: oriented x4, aware of why he is at the hospital and events leading to admission. Recalls rehab admission for last stroke     General Comments  VSS on 2.5L O2    Exercises     Shoulder Instructions      Home Living Family/patient expects to be discharged to:: Private residence Living Arrangements: Alone Available Help at Discharge: Other (Comment) (no) Type of Home: Apartment Home Access: Level entry     Home Layout: One level     Bathroom Shower/Tub: Producer, television/film/video: Standard Bathroom Accessibility: Yes   Home Equipment: Educational psychologist (4 wheels);Electric scooter   Additional Comments: 2.5 L O2 at all times  Lives With: Alone    Prior Functioning/Environment Prior Level of Function : Independent/Modified Independent;Driving             Mobility Comments: rollator ADLs Comments: ind; has assist for cleaning        OT Problem List: Decreased strength;Decreased range of motion;Decreased activity tolerance;Impaired balance (sitting and/or standing);Decreased cognition;Decreased safety awareness;Impaired UE functional use;Cardiopulmonary status limiting activity;Impaired vision/perception;Impaired sensation      OT Treatment/Interventions: Self-care/ADL training;Therapeutic  exercise;Energy conservation;DME and/or AE instruction;Neuromuscular education;Therapeutic activities;Cognitive remediation/compensation;Visual/perceptual remediation/compensation;Patient/family education;Balance training    OT Goals(Current goals can be found in the care plan section) Acute Rehab OT Goals Patient Stated Goal: none stated OT Goal Formulation: With patient Time For Goal Achievement: 06/27/23 Potential to Achieve Goals: Good ADL Goals Pt Will Perform Upper Body Dressing: with contact guard assist Pt Will Perform Lower Body Dressing: with contact guard assist;sitting/lateral leans Pt Will Transfer to Toilet: ambulating;regular height toilet;with min assist Pt Will Perform Tub/Shower Transfer: Tub transfer;Shower transfer;with contact guard assist;ambulating;rolling walker Pt/caregiver will Perform Home Exercise Program: Right Upper extremity;Increased strength;Increased ROM;With written HEP provided;Independently  OT Frequency: Min 1X/week    Co-evaluation              AM-PAC OT "6 Clicks" Daily Activity     Outcome Measure Help from another person eating meals?: A Little Help from another person taking care of personal grooming?: A Little Help from another person toileting, which includes using toliet, bedpan, or urinal?: A Little Help from another person bathing (including washing, rinsing, drying)?: A Lot Help from another person to put on and taking off regular upper body clothing?: A Little Help from another person to put on and taking off regular lower body clothing?: A Lot 6 Click Score: 16   End of Session Equipment Utilized During Treatment: Rolling walker (2 wheels);Oxygen;Gait belt Nurse Communication: Mobility status  Activity Tolerance: Patient tolerated treatment well Patient left: with call bell/phone within reach;in chair;with chair alarm set;with nursing/sitter in room  OT Visit Diagnosis: Unsteadiness on  feet (R26.81);Other abnormalities of gait  and mobility (R26.89);Muscle weakness (generalized) (M62.81);History of falling (Z91.81);Other symptoms and signs involving the nervous system (R29.898)                Time: 1610-9604 OT Time Calculation (min): 35 min Charges:  OT General Charges $OT Visit: 1 Visit OT Evaluation $OT Eval Moderate Complexity: 1 Mod OT Treatments $Therapeutic Activity: 8-22 mins  Carver Fila, OTD, OTR/L SecureChat Preferred Acute Rehab [phone]   Carver Fila Koonce 06/13/2023, 12:31 PM

## 2023-06-13 NOTE — Evaluation (Signed)
Speech Language Pathology Evaluation Patient Details Name: Luis [name] MRN: [id number] DOB: 1970/07/23 Today's Date: 06/13/2023 Time: 1035-1050 SLP Time Calculation (min) (ACUTE ONLY): 15 min  Problem List:  Patient Active Problem List   Diagnosis Date Noted   History of stroke 12/05/2022   MDD (major depressive disorder), 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/04/2022   Cocaine use 12/04/2022   Weakness 10/25/2022   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   Acute on chronic systolic heart failure, NYHA class 3 (HCC) 04/16/2022   Fall 03/24/2022   Biventricular ICD (implantable cardioverter-defibrillator) in place    Lightheadedness    Acute on chronic combined systolic and diastolic CHF (congestive heart failure) (HCC) 02/26/2022   Cocaine abuse (HCC) 02/26/2022   Chronic respiratory failure with hypoxia (HCC) - 2.5 L/min 02/26/2022   Sleep apnea 08/07/2021   MDD (major depressive disorder), recurrent severe, 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   Moderate episode of recurrent major depressive disorder (HCC) 05/08/2021   Generalized anxiety disorder 05/08/2021   Unspecified mood (affective) disorder (HCC) 05/08/2021   Insomnia 05/08/2021   Cocaine use disorder, moderate, dependence (HCC) 04/02/2021   Right arm weakness 01/28/2021   Right sided weakness 01/28/2021   Non-ischemic cardiomyopathy (HCC)    Morbid obesity (HCC) 05/05/2019   Chronic systolic CHF (congestive heart failure) (HCC) 01/17/2019   HLD (hyperlipidemia)    Diabetes (HCC)    Hypertension    GERD (gastroesophageal reflux disease)    Tobacco abuse    Past Medical History:  Past Medical History:  Diagnosis Date   Anxiety    Biventricular ICD (implantable cardioverter-defibrillator) in place    Chronic systolic CHF (congestive heart failure) (HCC)    Cocaine use    Diabetes mellitus without complication (HCC)    GERD (gastroesophageal reflux disease)    HLD (hyperlipidemia)    Hypertension     Morbid obesity (HCC)    NICM (nonischemic cardiomyopathy) (HCC)    OSA (obstructive sleep apnea)    PTSD (post-traumatic stress disorder)    on Depakote   Refusal of blood transfusions as patient is Jehovah's Witness    Tobacco abuse    Past Surgical History:  Past Surgical History:  Procedure Laterality Date   BIV ICD INSERTION CRT-D     FOOT FRACTURE SURGERY     ICD GENERATOR CHANGEOUT N/A 03/23/2022   Procedure: ICD GENERATOR CHANGEOUT;  Surgeon: Nelly Laurence, Roberts Gaudy, MD;  Location: [facility] INVASIVE CV LAB;  Service: Cardiovascular;  Laterality: N/A;   RIGHT HEART CATH N/A 03/06/2022   Procedure: RIGHT HEART CATH;  Surgeon: Orbie Pyo, MD;  Location[facility] INVASIVE CV LAB;  Service: Cardiovascular;  Laterality: N/A;   RIGHT/LEFT HEART CATH AND CORONARY ANGIOGRAPHY N/A 04/16/2022   Procedure: RIGHT/LEFT HEART CATH AND CORONARY ANGIOGRAPHY;  Surgeon: Laurey Morale, MD;  Location: [facility] INVASIVE CV LAB;  Service: Cardiovascular;  Laterality: N/A;   ROTATOR CUFF REPAIR     TONSILLECTOMY     HPI:  Luis [name] is a right handed 52 y.o. male with medical history significant of Combined CHF, ICD placement, VT, HTN, OSA on CPAP, chronic hypoxic respiratory failure on 2L, GERD, Anxiety, polysubstance abuse (tobacco, cocaine), T2DM, Morbid obesity who presents with right sided weakness. Pt with BSE and SLE in 2022 with grossly functional oropharyngeal swallowing abilities with noted cognitive deficits in the areas of orientation, executive functioning and delayed recall.  Head CT unremarkable for acute abnormalities.  MRI pending.   Assessment / Plan / Recommendation Clinical Impression  Pt was seen for a cognitive-linguistic evaluation and presents with moderate deficits in the areas of short-term memory, attention, problem solving, and executive functioning.  Pt completed the [facility] Mental Status Examination and scored overall 14/30 (n>/=27/30).  Pt previously scored 15/30 when  last assessed two years prior with similar deficits.  Expressive and receptive language appeared functional and no dysarthria was observed.  Recommend supervision and assistance for IADLs (including medication and financial management).  Pt reported that a family friend handles his finances and his significant other reported that she manages the pt's medications; however, pt stated that it is not consistent.  Pt will additionally benefit from ST at time of discharge targeting cognitive deficits.    SLP Assessment  SLP Recommendation/Assessment: Patient needs continued Speech Lanaguage Pathology Services SLP Visit Diagnosis: Cognitive communication deficit (R41.841)    Recommendations for follow up therapy are one component of a multi-disciplinary discharge planning process, led by the attending physician.  Recommendations may be updated based on patient status, additional functional criteria and insurance authorization.    Follow Up Recommendations  Skilled nursing-short term rehab (<3 hours/day)    Assistance Recommended at Discharge  Intermittent Supervision/Assistance  Functional Status Assessment Patient has had a recent decline in their functional status and demonstrates the ability to make significant improvements in function in a reasonable and predictable amount of time.  Frequency and Duration min 2x/week  2 weeks      SLP Evaluation Cognition  Overall Cognitive Status: History of cognitive impairments - at baseline Orientation Level: Oriented to person;Oriented to place;Oriented to time Attention: Sustained;Focused Focused Attention: Impaired Focused Attention Impairment: Verbal basic Sustained Attention: Impaired Sustained Attention Impairment: Verbal basic Memory: Impaired Memory Impairment: Storage deficit;Retrieval deficit;Decreased short term memory;Decreased recall of new information Decreased Short Term Memory: Verbal basic Awareness: Impaired Awareness Impairment:  Intellectual impairment Problem Solving: Impaired Problem Solving Impairment: Verbal basic;Verbal complex Safety/Judgment: Appears intact       Comprehension  Auditory Comprehension Overall Auditory Comprehension: Appears within functional limits for tasks assessed    Expression Expression Primary Mode of Expression: Verbal Verbal Expression Overall Verbal Expression: Appears within functional limits for tasks assessed Written Expression Dominant Hand: Right   Oral / Motor  Oral Motor/Sensory Function Overall Oral Motor/Sensory Function: Mild impairment Facial ROM: Within Functional Limits Facial Symmetry: Within Functional Limits Facial Sensation: Reduced right;Suspected CN V (Trigeminal) dysfunction Lingual ROM: Within Functional Limits Lingual Symmetry: Within Functional Limits Motor Speech Overall Motor Speech: Appears within functional limits for tasks assessed           Eino Farber, M.S., CCC-SLP Acute Rehabilitation Services Office: [phone]  Shanon Rosser [facility] 06/13/2023, 11:26 AM

## 2023-06-13 NOTE — Progress Notes (Signed)
Progress Note   Patient: Luis [name] NWG:[id number] DOB: 05/11/1971 DOA: 06/12/2023     0 DOS: the patient was seen and examined on 06/13/2023   Brief hospital course: 52 y.o. male with medical history significant of Combined CHF, ICD placement, VT, HTN, OSA on CPAP, chronic hypoxic respiratory failure on 2L, GERD, Anxiety, polysubstance abuse (tobacco, cocaine), T2DM, Morbid obesity who presents with right sided weakness.    Reports a left sided occipital/parietal headache for 3 days and then in past 2 days developed right sided upper and lower extremity weakness. Then today fell because his right leg gave out on him causing him to fall and hit his right forehead on a dresser. Says blood pressure has been high in the SBP 200s and has noted his glucose up to 500s. Also notes numbness to his feet. Has been nauseous but no vomiting. Has low appetite. Reports compliance with his Plavix.    He has a similar episode of right sided weakness in April and had negative CT head x2, MRA head and brain. Only thing positive for UDS for cocaine. There was documented concern for behavioral component conversion disorder to his exam as right sided weakness was not consistent with every exam. However due to his risk factors he was placed on Plavix.   Assessment and Plan: Right sided weakness -pt presented with about 3 days of right sided weakness.  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 Pt was continued on Plavix due to his risk factors and anaphylactic reaction to aspirin.  -CT head is negative -pt with ICD that will not be able to be turned off for MRI until Monday -Neurology following -PT/OT -continue Plavix   Chronic respiratory failure with hypoxia (HCC) - 2.5 L/min 2.5L via [place] as needed   Sleep apnea -Continue night time CPAP   Morbid obesity (HCC) BMI of 50   Diabetes (HCC) -uncontrolled with hyperglycemia. Last A1C of  11.8 in Nov -reports taking semglee, Novolog 70/30 and Lantus at home -start with 20 units semglee at bedtime and SSI      HLD (hyperlipidemia) -Continue statin   Combined systolic and diastolic heart failure (HCC) -Last echocardiogram on 02/2022 with a EF of 25%, global hypokinesis of the left ventricle, indeterminate diastolic function.  No significant valvular abnormalities -continue Bumex, digoxin, beta blocker -s/p ICD       Subjective: Complaining of headache this AM  Physical Exam: Vitals:   06/13/23 0015 06/13/23 0337 06/13/23 0730 06/13/23 1221  BP: (!) 148/75 126/62 (!) 147/95 (!) 145/77  Pulse: 83 70 64 78  Resp: 18 18 18 19   Temp: 98 F (36.7 C) 97.7 F (36.5 C) (!) 97.5 F (36.4 C) 97.9 F (36.6 C)  TempSrc: Oral Oral Oral Oral  SpO2: 96% 95% 96% 98%  Weight:      Height:       General exam: Awake, laying in bed, in nad Respiratory system: Normal respiratory effort, no wheezing Cardiovascular system: regular rate, s1, s2 Gastrointestinal system: Soft, nondistended, positive BS Central nervous system: CN2-12 grossly intact, strength intact Extremities: Perfused, no clubbing Skin: Normal skin turgor, no notable skin lesions seen Psychiatry: Mood normal // no visual hallucinations   Data Reviewed:  Labs reviewed: LDL 72, A1c 12.0    Family Communication: Pt in room, family not at bedside  Disposition: Status is: Observation The patient remains OBS appropriate and will d/c before 2 midnights.  Planned Discharge Destination: Home  Author: Rickey Barbara, MD 06/13/2023 3:38 PM  For on call review [email or web address].

## 2023-06-13 NOTE — Care Management Obs Status (Signed)
MEDICARE OBSERVATION STATUS NOTIFICATION   Patient Details  Name: Luis [name] MRN: [id number] Date of Birth: 01-10-1971   Medicare Observation Status Notification Given:  Yes    Lawerance Sabal, RN 06/13/2023, 2:31 PM

## 2023-06-13 NOTE — Hospital Course (Signed)
52 y.o. male with medical history significant of Combined CHF, ICD placement, VT, HTN, OSA on CPAP, chronic hypoxic respiratory failure on 2L, GERD, Anxiety, polysubstance abuse (tobacco, cocaine), T2DM, Morbid obesity who presents with right sided weakness.    Reports a left sided occipital/parietal headache for 3 days and then in past 2 days developed right sided upper and lower extremity weakness. Then today fell because his right leg gave out on him causing him to fall and hit his right forehead on a dresser. Says blood pressure has been high in the SBP 200s and has noted his glucose up to 500s. Also notes numbness to his feet. Has been nauseous but no vomiting. Has low appetite. Reports compliance with his Plavix.    He has a similar episode of right sided weakness in April and had negative CT head x2, MRA head and brain. Only thing positive for UDS for cocaine. There was documented concern for behavioral component conversion disorder to his exam as right sided weakness was not consistent with every exam. However due to his risk factors he was placed on Plavix.

## 2023-06-13 NOTE — Evaluation (Signed)
Clinical/Bedside Swallow Evaluation Patient Details  Name: Luis [name] MRN: [id number] Date of Birth: 1971-04-28  Today's Date: 06/13/2023 Time: SLP Start Time (ACUTE ONLY): 1051 SLP Stop Time (ACUTE ONLY): 1107 SLP Time Calculation (min) (ACUTE ONLY): 16 min  Past Medical History:  Past Medical History:  Diagnosis Date   Anxiety    Biventricular ICD (implantable cardioverter-defibrillator) in place    Chronic systolic CHF (congestive heart failure) (HCC)    Cocaine use    Diabetes mellitus without complication (HCC)    GERD (gastroesophageal reflux disease)    HLD (hyperlipidemia)    Hypertension    Morbid obesity (HCC)    NICM (nonischemic cardiomyopathy) (HCC)    OSA (obstructive sleep apnea)    PTSD (post-traumatic stress disorder)    on Depakote   Refusal of blood transfusions as patient is Jehovah's Witness    Tobacco abuse    Past Surgical History:  Past Surgical History:  Procedure Laterality Date   BIV ICD INSERTION CRT-D     FOOT FRACTURE SURGERY     ICD GENERATOR CHANGEOUT N/A 03/23/2022   Procedure: ICD GENERATOR CHANGEOUT;  Surgeon: Mealor, Roberts Gaudy, MD;  Location: [facility] INVASIVE CV LAB;  Service: Cardiovascular;  Laterality: N/A;   RIGHT HEART CATH N/A 03/06/2022   Procedure: RIGHT HEART CATH;  Surgeon: Orbie Pyo, MD;  Location: [facility] INVASIVE CV LAB;  Service: Cardiovascular;  Laterality: N/A;   RIGHT/LEFT HEART CATH AND CORONARY ANGIOGRAPHY N/A 04/16/2022   Procedure: RIGHT/LEFT HEART CATH AND CORONARY ANGIOGRAPHY;  Surgeon: Laurey Morale, MD;  Location: [facility] INVASIVE CV LAB;  Service: Cardiovascular;  Laterality: N/A;   ROTATOR CUFF REPAIR     TONSILLECTOMY     HPI:  Luis [name] is a right handed 52 y.o. male with medical history significant of Combined CHF, ICD placement, VT, HTN, OSA on CPAP, chronic hypoxic respiratory failure on 2L, GERD, Anxiety, polysubstance abuse (tobacco, cocaine), T2DM, Morbid obesity who presents with right  sided weakness. Pt with BSE and SLE in 2022 with grossly functional oropharyngeal swallowing abilities with noted cognitive deficits in the areas of orientation, executive functioning and delayed recall.  Head CT unremarkable for acute abnormalities.  MRI pending.    Assessment / Plan / Recommendation  Clinical Impression  Pt was seen for a clinical swallow evaluation and he presents with suspected mild oropharyngeal dysphagia.  Pt was encountered awake/alert in bed and was cooperative.  He reported that he began experiencing intermittent coughing with thin liquid intake starting yesterday.  He additionally reported mild globus sensation on the R side during deglutition.  This improved with head turn to the R when consuming liquids/solids.  Pt with mildly prolonged mastication of regular solids (pt reported dental pain).  He exhibited a delayed throat clear in 2/10 trials of thin liquid.   No overt s/sx of aspiration with nectar-thick liquid, puree, or regular solids.  Pt reported significant aversion to thickened liquids, therefore recommend that pt remain on thin liquids at this time with strict adherence to aspiration precautions.  Pt will benefit from diet change to Dysphagia 3 (soft) solids.  SLP will f/u to monitor diet tolerance.  Pt may benefit from an instrumental swallow study if clinical s/sx of aspiration persist with thin liquids.  SLP Visit Diagnosis: Dysphagia, unspecified (R13.10)    Aspiration Risk  Mild aspiration risk    Diet Recommendation Dysphagia 3 (Mech soft);Thin liquid    Liquid Administration via: Cup;Straw Medication Administration: Whole meds with puree Supervision: Patient  able to self feed Compensations: Minimize environmental distractions;Slow rate;Small sips/bites (Head turn to the right) Postural Changes: Seated upright at 90 degrees    Other  Recommendations Oral Care Recommendations: Oral care BID    Recommendations for follow up therapy are one component of a  multi-disciplinary discharge planning process, led by the attending physician.  Recommendations may be updated based on patient status, additional functional criteria and insurance authorization.  Follow up Recommendations Skilled nursing-short term rehab (<3 hours/day)      Assistance Recommended at Discharge Intermittent Supervision/Assistance  Functional Status Assessment Patient has had a recent decline in their functional status and demonstrates the ability to make significant improvements in function in a reasonable and predictable amount of time.  Frequency and Duration min 2x/week  2 weeks       Prognosis Prognosis for improved oropharyngeal function: Good      Swallow Study   General HPI: Luis [name] is a right handed 52 y.o. male with medical history significant of Combined CHF, ICD placement, VT, HTN, OSA on CPAP, chronic hypoxic respiratory failure on 2L, GERD, Anxiety, polysubstance abuse (tobacco, cocaine), T2DM, Morbid obesity who presents with right sided weakness. Pt with BSE and SLE in 2022 with grossly functional oropharyngeal swallowing abilities with noted cognitive deficits in the areas of orientation, executive functioning and delayed recall.  Head CT unremarkable for acute abnormalities.  MRI pending. Type of Study: Bedside Swallow Evaluation Previous Swallow Assessment: See HPI Diet Prior to this Study: Regular;Thin liquids (Level 0) Temperature Spikes Noted: No Respiratory Status: Nasal cannula History of Recent Intubation: No Behavior/Cognition: Alert;Cooperative;Pleasant mood Oral Cavity Assessment: Within Functional Limits Oral Care Completed by SLP: No Oral Cavity - Dentition: Missing dentition Vision: Functional for self-feeding Self-Feeding Abilities: Able to feed self Patient Positioning: Upright in chair Baseline Vocal Quality: Normal Volitional Cough: Strong Volitional Swallow: Able to elicit    Oral/Motor/Sensory Function Overall Oral  Motor/Sensory Function: Mild impairment Facial ROM: Within Functional Limits Facial Symmetry: Within Functional Limits Facial Sensation: Reduced right;Suspected CN V (Trigeminal) dysfunction Lingual ROM: Within Functional Limits Lingual Symmetry: Within Functional Limits   Ice Chips Ice chips: Not tested   Thin Liquid Thin Liquid: Impaired Presentation: Cup;Straw Pharyngeal  Phase Impairments: Throat Clearing - Delayed    Nectar Thick Nectar Thick Liquid: Within functional limits Presentation: Cup   Honey Thick Honey Thick Liquid: Not tested   Puree Puree: Within functional limits Presentation: Spoon;Self Fed   Solid     Solid: Impaired Presentation: Self Fed Oral Phase Impairments: Impaired mastication Oral Phase Functional Implications: Impaired mastication     Luis [name], M.S., CCC-SLP Acute Rehabilitation Services Office: [phone]  Shanon Rosser Luis [name] 06/13/2023,11:36 AM

## 2023-06-13 NOTE — Progress Notes (Signed)
Inpatient Rehab Admissions Coordinator Note:   Per therapy patient was screened for CIR candidacy by Keiasha Diep Luvenia Starch, CCC-SLP. Note pt is under observation status at this time. Pt may not have the medical necessity to warrant an inpatient rehab stay if they remain observation. If status were to change to inpatient, CIR admissions team will screen for candidacy.   Wolfgang Phoenix, MS, CCC-SLP Admissions Coordinator [phone] 06/13/23 2:34 PM

## 2023-06-13 NOTE — Evaluation (Addendum)
Physical Therapy Evaluation Patient Details Name: Luis [name] MRN: [id number] DOB: 1970/12/12 Today's Date: 06/13/2023  History of Present Illness  Pt is a 52 y/o M presenting to ED from home with R sided weakness and decr snesation x5 days. Fell and hit head on dresser, blurred visioin. CTH negative, MRI pending. PMH includes combined CHF, ICD placement, VT, HTN, OSA on CPAP, PTSD, chronic hypoxic respiratory failure on 2.5L, GERD, anxiety, polysubstance abuse, DM2, morbid obesity  Clinical Impression  PTA, pt lives alone, is independent with ADL's and uses a Rollator for mobility. Pt presents with right sided weakness, impaired sensation, gait abnormalities, decreased standing balance. Pt requiring minimal assist for bed mobility and moderate assist for transfers to standing. Able to take lateral steps with decreased right foot clearance. Suspect steady progress. Patient will benefit from intensive inpatient follow up therapy, >3 hours/day in order to address deficits and maximize functional mobility.       If plan is discharge home, recommend the following: A lot of help with walking and/or transfers;A little help with bathing/dressing/bathroom   Can travel by private vehicle        Equipment Recommendations Other (comment) (TBA)  Recommendations for Other Services  Rehab consult    Functional Status Assessment Patient has had a recent decline in their functional status and demonstrates the ability to make significant improvements in function in a reasonable and predictable amount of time.     Precautions / Restrictions Precautions Precautions: Fall Precaution Comments: 2.5L O2 baseline Restrictions Weight Bearing Restrictions Per Provider Order: No      Mobility  Bed Mobility Overal bed mobility: Needs Assistance Bed Mobility: Supine to Sit, Sit to Supine     Supine to sit: Contact guard Sit to supine: Min assist   General bed mobility comments: Pt progressing  to left side of bed without physical assist, HOB elevated. MinA for LE elevation back into bed    Transfers Overall transfer level: Needs assistance Equipment used: Rolling walker (2 wheels) Transfers: Sit to/from Stand Sit to Stand: Mod assist           General transfer comment: Use of momentum to transfer to standing, wider BOS, modA to power up. Able to take lateral steps along edge of bed with decreased R foot clearance    Ambulation/Gait                  Stairs            Wheelchair Mobility     Tilt Bed    Modified Rankin (Stroke Patients Only) Modified Rankin (Stroke Patients Only) Pre-Morbid Rankin Score: Slight disability Modified Rankin: Moderately severe disability     Balance Overall balance assessment: Needs assistance Sitting-balance support: Feet supported Sitting balance-Leahy Scale: Fair     Standing balance support: During functional activity, Reliant on assistive device for balance Standing balance-Leahy Scale: Poor Standing balance comment: reliant on external support                             Pertinent Vitals/Pain Pain Assessment Pain Assessment: Faces Faces Pain Scale: Hurts even more Pain Location: head Pain Descriptors / Indicators: Headache Pain Intervention(s): Monitored during session    Home Living Family/patient expects to be discharged to:: Private residence Living Arrangements: Alone Available Help at Discharge: Other (Comment) (no) Type of Home: Apartment Home Access: Level entry       Home Layout: One level Home Equipment: Shower  seat;Rollator (4 wheels);Electric scooter Additional Comments: 2.5 L O2 at all times    Prior Function Prior Level of Function : Independent/Modified Independent;Driving             Mobility Comments: rollator ADLs Comments: ind; has assist for cleaning     Extremity/Trunk Assessment   Upper Extremity Assessment Upper Extremity Assessment: Defer to OT  evaluation RUE Deficits / Details: 3/5 globally, decr [facility] RUE Sensation: decreased light touch;decreased proprioception RUE Coordination: decreased fine motor;decreased gross motor    Lower Extremity Assessment Lower Extremity Assessment: RLE deficits/detail;LLE deficits/detail RLE Deficits / Details: Hip flexion 2/5, knee extension 3-/5, ankle dorsiflexion 2/5 LLE Deficits / Details: Grossly 5/5    Cervical / Trunk Assessment Cervical / Trunk Assessment: Normal  Communication   Communication Communication: No apparent difficulties  Cognition Arousal: Alert Behavior During Therapy: WFL for tasks assessed/performed Overall Cognitive Status: Within Functional Limits for tasks assessed                                 General Comments: oriented x4, aware of why he is at the hospital and events leading to admission. Recalls rehab admission for last stroke        General Comments General comments (skin integrity, edema, etc.): VSS on 2.5L O2    Exercises     Assessment/Plan    PT Assessment Patient needs continued PT services  PT Problem List Decreased strength;Decreased activity tolerance;Decreased mobility;Decreased balance       PT Treatment Interventions DME instruction;Gait training;Stair training;Functional mobility training;Therapeutic activities;Therapeutic exercise;Balance training;Patient/family education;Neuromuscular re-education    PT Goals (Current goals can be found in the Care Plan section)  Acute Rehab PT Goals Patient Stated Goal: to get stronger PT Goal Formulation: With patient Time For Goal Achievement: 06/27/23 Potential to Achieve Goals: Good    Frequency Min 1X/week     Co-evaluation               AM-PAC PT "6 Clicks" Mobility  Outcome Measure Help needed turning from your back to your side while in a flat bed without using bedrails?: A Little Help needed moving from lying on your back to sitting on the side of a flat bed  without using bedrails?: A Little Help needed moving to and from a bed to a chair (including a wheelchair)?: A Little Help needed standing up from a chair using your arms (e.g., wheelchair or bedside chair)?: A Lot Help needed to walk in hospital room?: Total Help needed climbing 3-5 steps with a railing? : Total 6 Click Score: 13    End of Session Equipment Utilized During Treatment: Gait belt Activity Tolerance: Patient tolerated treatment well Patient left: in bed;with call bell/phone within reach;with bed alarm set   PT Visit Diagnosis: Unsteadiness on feet (R26.81);Other abnormalities of gait and mobility (R26.89);Difficulty in walking, not elsewhere classified (R26.2)    Time: 8657-8469 PT Time Calculation (min) (ACUTE ONLY): 12 min   Charges:   PT Evaluation $PT Eval Low Complexity: 1 Low   PT General Charges $$ ACUTE PT VISIT: 1 Visit         Lillia Pauls, PT, DPT Acute Rehabilitation Services Office [phone]   Norval Morton 06/13/2023, 2:18 PM

## 2023-06-13 NOTE — Consult Note (Signed)
NEUROLOGY CONSULT NOTE   Date of service: June 13, 2023 Patient Name: Luis [name] MRN:  [id number] DOB:  20-Feb-1971 Chief Complaint: "Right sided weakness" Requesting Provider: Anselm Jungling, DO  History of Present Illness  Luis [name] is a 52 y.o. male who has a past medical history of Anxiety, Biventricular ICD (implantable cardioverter-defibrillator) in place, Chronic systolic CHF (congestive heart failure) (HCC), Cocaine use, Diabetes mellitus without complication (HCC), GERD (gastroesophageal reflux disease), HLD (hyperlipidemia), Hypertension, Morbid obesity (HCC), NICM (nonischemic cardiomyopathy) (HCC), OSA (obstructive sleep apnea), PTSD (post-traumatic stress disorder), Refusal of blood transfusions as patient is Jehovah's Witness, and Tobacco abuse., as well as stroke with mild right sided deficit who presented to the [facility] ED on Saturday afternoon with multiple complaints, including 4 to 5 days of left-sided headache with right-sided weakness, increased shortness of breath especially when laying flat, increased lower extremity edema, high blood pressures up to 200 systolic at home and elevated blood sugars has into the 4 and 500s despite reported compliance with medications. He also states that he had a fall that occurred approximately 4 days ago with mild head injury at the time. Does not endorse any vision loss, dysarthria, aphasia or confusion. Neurology was consulted for right sided weakness that was noted by both EDP and Hospitalist.     ROS  Comprehensive ROS performed and pertinent positives documented in HPI    Past History   Past Medical History:  Diagnosis Date   Anxiety    Biventricular ICD (implantable cardioverter-defibrillator) in place    Chronic systolic CHF (congestive heart failure) (HCC)    Cocaine use    Diabetes mellitus without complication (HCC)    GERD (gastroesophageal reflux disease)    HLD (hyperlipidemia)    Hypertension    Morbid  obesity (HCC)    NICM (nonischemic cardiomyopathy) (HCC)    OSA (obstructive sleep apnea)    PTSD (post-traumatic stress disorder)    on Depakote   Refusal of blood transfusions as patient is Jehovah's Witness    Tobacco abuse     Past Surgical History:  Procedure Laterality Date   BIV ICD INSERTION CRT-D     FOOT FRACTURE SURGERY     ICD GENERATOR CHANGEOUT N/A 03/23/2022   Procedure: ICD GENERATOR CHANGEOUT;  Surgeon: Nelly Laurence, Roberts Gaudy, MD;  Location: [facility] INVASIVE CV LAB;  Service: Cardiovascular;  Laterality: N/A;   RIGHT HEART CATH N/A 03/06/2022   Procedure: RIGHT HEART CATH;  Surgeon: Orbie Pyo, MD;  Location: [facility] INVASIVE CV LAB;  Service: Cardiovascular;  Laterality: N/A;   RIGHT/LEFT HEART CATH AND CORONARY ANGIOGRAPHY N/A 04/16/2022   Procedure: RIGHT/LEFT HEART CATH AND CORONARY ANGIOGRAPHY;  Surgeon: Laurey Morale, MD;  Location: [facility] INVASIVE CV LAB;  Service: Cardiovascular;  Laterality: N/A;   ROTATOR CUFF REPAIR     TONSILLECTOMY      Family History: Family History  Problem Relation Age of Onset   Hypertension Mother    Bone cancer Father    Lung cancer Father     Social History  reports that he has been smoking cigarettes. He started smoking about 16 years ago. He has a 7.5 pack-year smoking history. He has never used smokeless tobacco. He reports that he does not currently use alcohol. He reports current drug use. Drugs: Cocaine and Marijuana.  Allergies  Allergen Reactions   Aspirin Anaphylaxis and Other (See Comments)    Throat closing    Iodine-131 Hives   Peanut (Diagnostic) Anaphylaxis  Throat closes   Iodinated Contrast Media Hives    Can be premedicated    Latex Hives and Rash   Penicillins Nausea And Vomiting   Ultram [Tramadol] Hives and Nausea Only   Zestril [Lisinopril] Cough   Cinnamon    Amoxicillin-Pot Clavulanate Diarrhea   Lidocaine Rash    Rash from adhesive on lidocaine patches     Medications   Current  Facility-Administered Medications:     stroke: early stages of recovery book, , Does not apply, Once, Tu, Ching T, DO   acetaminophen (TYLENOL) tablet 650 mg, 650 mg, Oral, Q4H PRN **OR** acetaminophen (TYLENOL) 160 MG/5ML solution 650 mg, 650 mg, Per Tube, Q4H PRN **OR** acetaminophen (TYLENOL) suppository 650 mg, 650 mg, Rectal, Q4H PRN, Tu, Ching T, DO   bumetanide (BUMEX) tablet 2 mg, 2 mg, Oral, BID, Tu, Ching T, DO   carvedilol (COREG) tablet 3.125 mg, 3.125 mg, Oral, BID WC, Tu, Ching T, DO   clopidogrel (PLAVIX) tablet 75 mg, 75 mg, Oral, Daily, Tu, Ching T, DO   digoxin (LANOXIN) tablet 0.125 mg, 0.125 mg, Oral, Daily, Tu, Ching T, DO   enoxaparin (LOVENOX) injection 40 mg, 40 mg, Subcutaneous, Q24H, Tu, Ching T, DO, 40 mg at 06/12/23 2319   HYDROcodone-acetaminophen (NORCO/VICODIN) 5-325 MG per tablet 1 tablet, 1 tablet, Oral, Q4H PRN, Opyd, Lavone Neri, MD   HYDROmorphone (DILAUDID) injection 0.5 mg, 0.5 mg, Intravenous, Q4H PRN, Opyd, Lavone Neri, MD, 0.5 mg at 06/13/23 0414   hydrOXYzine (ATARAX) tablet 25 mg, 25 mg, Oral, TID PRN, Tu, Ching T, DO   insulin aspart (novoLOG) injection 0-20 Units, 0-20 Units, Subcutaneous, TID PC & HS, Tu, Ching T, DO   insulin glargine-yfgn (SEMGLEE) injection 20 Units, 20 Units, Subcutaneous, QHS, Tu, Ching T, DO, 20 Units at 06/13/23 0056   pantoprazole (PROTONIX) EC tablet 40 mg, 40 mg, Oral, Daily, Tu, Ching T, DO   potassium chloride SA (KLOR-CON M) CR tablet 20 mEq, 20 mEq, Oral, Daily, Tu, Ching T, DO   rosuvastatin (CRESTOR) tablet 40 mg, 40 mg, Oral, Daily, Tu, Ching T, DO   senna-docusate (Senokot-S) tablet 1 tablet, 1 tablet, Oral, QHS PRN, Tu, Ching T, DO   spironolactone (ALDACTONE) tablet 25 mg, 25 mg, Oral, Daily, Tu, Ching T, DO   traZODone (DESYREL) tablet 300 mg, 300 mg, Oral, QHS PRN, Tu, Ching T, DO  Vitals   Vitals:   06/12/23 2200 06/12/23 2300 06/13/23 0015 06/13/23 0337  BP: (!) 141/77 132/79 (!) 148/75 126/62  Pulse: 88 79 83  70  Resp: 15 12 18 18   Temp:   98 F (36.7 C) 97.7 F (36.5 C)  TempSrc:   Oral Oral  SpO2: 98% 97% 96% 95%  Weight:      Height:        Body mass index is 50.75 kg/m.  Physical Exam   Physical Exam  General: Supermorbid obesity HEENT-  [place]/AT    Lungs- Respirations unlabored Extremities- No edema  Neurological Examination Mental Status: Awake, alert, oriented x 5, thought content appropriate.  Speech fluent without evidence of aphasia.  Able to follow all commands without difficulty. Cranial Nerves: II: PERRL OS: Visual fields intact in all 4 quadrants. Can read large type as well as examiner's name on badge.  OD: Visual field constriction in all 4 quadrants. Can only read large type on examiner's badge Note: visual acuity testing was without spectacles, which he normally wears No extinction to DSS.  III,IV, VI: No ptosis. EOMI.  No nystagmus V: Temp sensation decreased on the right VII: Smile symmetric, purses lips symmetrically VIII: Hearing intact to voice IX,X: No hoarseness. Palate elevates normally.  XI: Symmetric head turning XII: Midline tongue extension Motor: LUE 5/5 proximally and distally RUE 4-/5 deltoid, 4/5 biceps and triceps, 4/5 grip. There is some giveway when examiner rapidly changes force and direction of resistance. Also gesticulated normally with RUE briefly during conversation.  LLE 5/5 proximally and distally RLE Unable to elevate thigh off bed in sitting position: HF 2/5. KE 3/5, KF 3/5, unable to dorsiflex or plantar flex foot, but tone in right ankle was the same as on the left, without abnormal positioning to suggest a drop foot No pronator drift with testing of [place].  Sensory: Decreased temp and FT sensation to thigh and lower leg on the right.  Normal sensation to RUE shoulder, upper arm and forearm, but was apparently insensate to FT and deep pressure to the same limb.  LUE and LLE subjectively with normal temp and FT sensation.  Deep Tendon  Reflexes: 2+ and symmetric bilateral brachioradialis. 0 bilateral patellae and achilles.  Cerebellar: No ataxia with FNF bilaterally, but slower on the right. No action or rest tremor.   Gait: Deferred. Able to sit up on edge of bed while pulling on examiner's arm with his LUE. No listing to the right or left.    Labs/Imaging/Neurodiagnostic studies   CBC:  Recent Labs  Lab July 03, 2023 1539  WBC 7.5  NEUTROABS 5.0  HGB 13.6  HCT 41.9  MCV 88.0  PLT 237   Basic Metabolic Panel:  Lab Results  Component Value Date   NA 137 07-03-23   K 4.5 2023-07-03   CO2 24 Jul 03, 2023   GLUCOSE 421 (H) 07-03-23   BUN 10 07/03/23   CREATININE 0.86 07/03/2023   CALCIUM 9.5 03-Jul-2023   GFRNONAA >60 03-Jul-2023   GFRAA >60 09/05/2019   Lipid Panel:  Lab Results  Component Value Date   LDLCALC 97 10/05/2022   HgbA1c:  Lab Results  Component Value Date   HGBA1C 9.2 (H) 10/05/2022   Urine Drug Screen:     Component Value Date/Time   LABOPIA NONE DETECTED 07-03-2023 1546   COCAINSCRNUR NONE DETECTED 03-Jul-2023 1546   LABBENZ NONE DETECTED Jul 03, 2023 1546   AMPHETMU NONE DETECTED 07-03-23 1546   THCU NONE DETECTED 03-Jul-2023 1546   LABBARB NONE DETECTED 07-03-2023 1546    Alcohol Level     Component Value Date/Time   ETH <10 03-Jul-2023 1539   INR  Lab Results  Component Value Date   INR 0.9 2023/07/03   APTT  Lab Results  Component Value Date   APTT 24 07/03/2023   TTE from 02/27/22:  1. Left ventricular ejection fraction, by estimation, is 25%. The left  ventricle has severely decreased function. The left ventricle demonstrates  global hypokinesis. Cannot comment on regional wall motion abnormalities  and Definity contrast was not  attempted. Consider repeat limited echo for RWM assessment if clinically  indicated. The left ventricular internal cavity size was mildly dilated.  Left ventricular diastolic parameters are indeterminate.   2. Right ventricular systolic  function is mildly reduced. The right  ventricular size is normal. Tricuspid regurgitation signal is inadequate  for assessing PA pressure.   3. The mitral valve is grossly normal. Trivial mitral valve  regurgitation. No evidence of mitral stenosis.   4. The aortic valve is grossly normal. There is mild calcification of the  aortic valve. Aortic valve regurgitation  is not visualized. No aortic  stenosis is present.   5. The inferior vena cava is normal in size with greater than 50%  respiratory variability, suggesting right atrial pressure of 3 mmHg.    ASSESSMENT  52 y.o. male who has a past medical history of Anxiety, Biventricular ICD (implantable cardioverter-defibrillator) in place, Chronic systolic CHF (congestive heart failure) (HCC), Cocaine use, Diabetes mellitus without complication (HCC), GERD (gastroesophageal reflux disease), HLD (hyperlipidemia), Hypertension, Morbid obesity (HCC), NICM (nonischemic cardiomyopathy) (HCC), OSA (obstructive sleep apnea), PTSD (post-traumatic stress disorder), Refusal of blood transfusions as patient is Jehovah's Witness, and Tobacco abuse., as well as stroke with mild right sided deficit who presented to the [facility] ED on Saturday afternoon with multiple complaints, including 4 to 5 days of left-sided headache with right-sided weakness, increased shortness of breath especially when laying flat, increased lower extremity edema, high blood pressures up to 200 systolic at home and elevated blood sugars has into the 4 and 500s despite reported compliance with medications. He also states that he had a fall that occurred approximately 4 days ago with mild head injury at the time. Does not endorse any vision loss, dysarthria, aphasia or confusion. Neurology was consulted for right sided weakness that was noted by both EDP and Hospitalist. - Exam reveals findings suggestive of possible functional etiology for his apparent right sided weakness.  - CT head: No evidence  of acute intracranial abnormality.  - Prior stroke work up from April of this year: - MRA head: Motion limited study without large vessel occlusion or visible proximal high-grade stenosis. - MRI brain was negative.  - Technically limited CTA head and neck showed poor visualization of the anterior left MCA branches, which could indicate stenosis or be related to bolus timing. There was no hemodynamically significant stenosis in the neck at that time.  - Overall impression: Possible psychogenic pseudostroke versus new left sided lacunar infarction, versus recrudescence of prior stroke symptoms in the context of elevated glucose. Of note, he is at increased risk of stroke given severely reduced LVEF of 25% with global hypokinesis demonstrated on TTE in 2023.  RECOMMENDATIONS  - Continue Plavix - ASA contraindicated given prior history of anaphylaxis with this medication.  - MRI brain without contrast. Will need to be done on Monday as he has an MRI-compatible biventricular ICD in place - If MRI brain is abnormal, obtain TTE. Last TTE was in September of 2023.  - Consider a Cardiology consult. Alternatively he can have this done at the [facility] hospital.  - Cardiac telemetry - Frequent neuro checks - Glycemic control - PT/OT/Speech - HgbA1c, fasting lipid panel - Could be considered for initiation of semaglutide for his supermorbid obesity and diabetes, as this medication has also been demonstrated to reduce cardiovascular and all-cause death in patients with heart failure and can be given safely regardless of heart failure subtype (Deanfield et al. Dorice Lamas and cardiovascular outcomes in patients with obesity and prevalent heart failure: a prespecified analysis of the SELECT trial. The [employer] [facility], pp [phone], February 20, 2023)  ______________________________________________________________________    Dessa Phi, Beth Spackman, MD [facility]

## 2023-06-13 NOTE — Inpatient Diabetes Management (Signed)
Inpatient Diabetes Program Recommendations  AACE/ADA: New Consensus Statement on Inpatient Glycemic Control (2015)  Target Ranges:  Prepandial:   less than 140 mg/dL      Peak postprandial:   less than 180 mg/dL (1-2 hours)      Critically ill patients:  140 - 180 mg/dL    Latest Reference Range & Units 10/05/22 11:50 06/13/23 04:43  Hemoglobin A1C 4.8 - 5.6 % 9.2 (H) 12.0 (H)  297 mg/dl  (H): Data is abnormally high  Latest Reference Range & Units 06/12/23 15:30 06/12/23 18:46 06/13/23 06:34 06/13/23 12:01  Glucose-Capillary 70 - 99 mg/dL 323 (H)  6 units Novolog @1715  254 (H)    20 units Semglee @0056  309 (H)  20 units Novolog  366 (H)  20 units Novolog   (H): Data is abnormally high   Admit with: Weakness--Neuro consulted  History: DM2, Cocaine Abuse, CHF, CVA  Home DM Meds: Farxiga 10 mg daily       Novolog 70/30 Mix Insulin 70 units BID       Lantus 25 units QPM  Current Orders: Novolog Resistant Correction Scale/ SSI (0-20 units) TID AC + HS     Semglee 20 units at bedtime    Novolog 70/30 Insulin listed in Home Med rec, hwoever, question if pt is supposed ot be taking Novolog? [facility] notes list Novolog 25 units TID with meals    MD- Please consider starting Novolog Meal Coverage:  Novolog 6 units TID with meals to start HOLD if pt NPO HOLD if pt eats <50% meals    --Will follow patient during hospitalization--  Ambrose Finland RN, MSN, CDCES Diabetes Coordinator Inpatient Glycemic Control Team Team Pager: [phone] (8a-5p)

## 2023-06-13 NOTE — Progress Notes (Addendum)
STROKE TEAM PROGRESS NOTE   BRIEF HPI [name] is a 52 y.o. male with history of Anxiety, Biventricular ICD (implantable cardioverter-defibrillator) in place, Chronic systolic CHF (congestive heart failure) (HCC), Cocaine use, Diabetes mellitus without complication (HCC), GERD (gastroesophageal reflux disease), HLD (hyperlipidemia), Hypertension, Morbid obesity (HCC), NICM (nonischemic cardiomyopathy) (HCC), OSA (obstructive sleep apnea), PTSD (post-traumatic stress disorder), Refusal of blood transfusions as patient is Jehovah's Witness, and Tobacco abuse., as well as stroke with mild right sided deficit who presented to the [facility] ED on Saturday afternoon with multiple complaints, including 4 to 5 days of left-sided headache with right-sided weakness, increased shortness of breath especially when laying flat, increased lower extremity edema, high blood pressures up to 200 systolic at home and elevated blood sugars has into the 4 and 500s despite reported compliance with medications. He also states that he had a fall that occurred approximately 4 days ago with mild head injury at the time. Does not endorse any vision loss, dysarthria, aphasia or confusion. Neurology was consulted for right sided weakness that was noted by both EDP and Hospitalist.   NIH on Admission 3  SIGNIFICANT HOSPITAL EVENTS   INTERIM HISTORY/SUBJECTIVE MRI tomorrow given pacemaker, reports difficulty controlling sugars recently. Still having right sided weakness and diminished sensation.   OBJECTIVE  CBC    Component Value Date/Time   WBC 7.5 06/12/2023 1539   RBC 4.76 06/12/2023 1539   HGB 13.6 06/12/2023 1539   HCT 41.9 06/12/2023 1539   PLT 237 06/12/2023 1539   MCV 88.0 06/12/2023 1539   MCH 28.6 06/12/2023 1539   MCHC 32.5 06/12/2023 1539   RDW 12.8 06/12/2023 1539   LYMPHSABS 1.8 06/12/2023 1539   MONOABS 0.7 06/12/2023 1539   EOSABS 0.1 06/12/2023 1539   BASOSABS 0.0 06/12/2023 1539     BMET    Component Value Date/Time   NA 137 06/12/2023 1539   NA 142 07/13/2022 1436   K 4.5 06/12/2023 1539   CL 99 06/12/2023 1539   CO2 24 06/12/2023 1539   GLUCOSE 421 (H) 06/12/2023 1539   BUN 10 06/12/2023 1539   BUN 9 07/13/2022 1436   CREATININE 0.86 06/12/2023 1539   CALCIUM 9.5 06/12/2023 1539   EGFR 79 07/13/2022 1436   GFRNONAA >60 06/12/2023 1539    IMAGING past 24 hours DG Chest Port 1 View Result Date: 06/12/2023 CLINICAL DATA:  Shortness of breath EXAM: PORTABLE CHEST 1 VIEW COMPARISON:  12/07/2022 FINDINGS: Left-sided multi lead pacing device as before. Low lung volumes. Cardiomegaly with central broncho vascular crowding but suspected slight central congestion. No pleural effusion or pneumothorax IMPRESSION: Low lung volumes with cardiomegaly and suspected slight central congestion. Electronically Signed   By: Jasmine Pang M.D.   On: 06/12/2023 18:04   CT Head Wo Contrast Result Date: 06/12/2023 CLINICAL DATA:  Headache, new onset (Age >= 51y) Headache, increasing frequency or severity R sided weakness EXAM: CT HEAD WITHOUT CONTRAST TECHNIQUE: Contiguous axial images were obtained from the base of the skull through the vertex without intravenous contrast. RADIATION DOSE REDUCTION: This exam was performed according to the departmental dose-optimization program which includes automated exposure control, adjustment of the mA and/or kV according to patient size and/or use of iterative reconstruction technique. COMPARISON:  CT head April 29, 24. FINDINGS: Brain: No evidence of acute infarction, hemorrhage, hydrocephalus, extra-axial collection or mass lesion/mass effect. Vascular: No hyperdense vessel or unexpected calcification. Skull: No acute fracture. Sinuses/Orbits: Left maxillary sinus mucosal thickening. No acute orbital findings.  Other: Chronic nonspecific soft tissue thickening the suboccipital region. IMPRESSION: No evidence of acute intracranial abnormality.  Electronically Signed   By: Feliberto Harts M.D.   On: 06/12/2023 17:19    Vitals:   06/12/23 2300 06/13/23 0015 06/13/23 0337 06/13/23 0730  BP: 132/79 (!) 148/75 126/62 (!) 147/95  Pulse: 79 83 70 64  Resp: 12 18 18 18   Temp:  98 F (36.7 C) 97.7 F (36.5 C) (!) 97.5 F (36.4 C)  TempSrc:  Oral Oral Oral  SpO2: 97% 96% 95% 96%  Weight:      Height:         PHYSICAL EXAM General:  Alert, well-nourished, well-developed patient  Psych: tearful throughout exam, patient expresses anxiety regarding health  CV: Regular rate and rhythm on monitor Respiratory:  Regular, unlabored respirations, [place] in place GI: Abdomen soft and nontender   NEURO:  Mental Status: AA&Ox3, patient is able to give clear and coherent history Speech/Language: speech is without dysarthria or aphasia.  Naming, repetition, fluency, and comprehension intact.  Cranial Nerves:  II: PERRL. Visual fields full.  III, IV, VI: EOMI. Eyelids elevate symmetrically.  V: Sensation is intact to light touch and symmetrical to face.  VII: Face is symmetrical resting and smiling VIII: hearing intact to voice. IX, X: Palate elevates symmetrically. Phonation is normal.  DU:KGURKYHC shrug 5/5. XII: tongue is midline without fasciculations. Motor: LUE 5/5 proximally and distally RUE 4-/5 deltoid, 4/5 biceps and triceps, 4/5 grip. LLE 5/5 proximally and distally RLE Unable to elevate thigh off bed in sitting position: HF 2/5. KE 3/5, KF 3/5, unable to dorsiflex or plantar flex foot.  Some giveway weakness suggesting a functional overlay to exam Sensory: Decreased temp and FT sensation to thigh and lower leg on the right.  Normal sensation to RUE shoulder, upper arm and forearm, but was apparently insensate to FT and deep pressure to the same limb.  LUE and LLE subjectively with normal temp and FT sensation.  Cerebellar: No ataxia with FNF bilaterally, but slower on the right. No action or rest tremor.   Gait: Deferred     Most Recent NIH 3     ASSESSMENT/PLAN  Acute Ischemic Infarct: likely left hemispheric infarct  Code Stroke CT head - No evidence of acute intracranial abnormality.   MRI  pending  Carotid Doppler  pending  2D Echo pending  LDL 72 HgbA1c 12.0 VTE prophylaxis - lovenox clopidogrel 75 mg daily prior to admission, now on clopidogrel 75 mg daily  ASA allergy- anaphylaxis Therapy recommendations:  Pending Disposition:  pending completion of work up   Congestive heart failure  Last echocardiogram on 02/2022 with a EF of 25%, global hypokinesis of the left ventricle, indeterminate diastolic function.  No significant valvular abnormalities Updated echo pending  continue Bumex, digoxin, beta blocker s/p ICD  Chronic respiratory failure with hypoxia  2L [place]   Hyperlipidemia Home meds:  Crestor, resumed in hospital LDL 72, goal < 70 Continue statin at discharge  Diabetes type II Controlled Home meds:  insulin, farxiga HgbA1c 12.0, goal < 7.0 CBGs SSI Recommend close follow-up with PCP for better DM control  Other Stroke Risk Factors Obesity, Body mass index is 50.75 kg/m., BMI >/= 30 associated with increased stroke risk, recommend weight loss, diet and exercise as appropriate   Hospital day # 0  Patient seen and examined by NP/APP with MD. MD to update note as needed.   Elmer Picker, DNP, FNP-BC [facility] Pager: [phone]  ATTENDING  ATTESTATION:  As above. Same day note. No charge.   Jonalyn Sedlak,MD   To contact Stroke Continuity provider, please refer to [email or web address]. After hours, contact General Neurology

## 2023-06-13 NOTE — Plan of Care (Signed)
  Problem: Education: Goal: Ability to describe self-care measures that may prevent or decrease complications (Diabetes Survival Skills Education) will improve Outcome: Progressing Goal: Individualized Educational Video(s) Outcome: Progressing   Problem: Coping: Goal: Ability to adjust to condition or change in health will improve Outcome: Progressing   Problem: Fluid Volume: Goal: Ability to maintain a balanced intake and output will improve Outcome: Progressing   Problem: Health Behavior/Discharge Planning: Goal: Ability to identify and utilize available resources and services will improve Outcome: Progressing Goal: Ability to manage health-related needs will improve Outcome: Progressing   Problem: Metabolic: Goal: Ability to maintain appropriate glucose levels will improve Outcome: Progressing   Problem: Nutritional: Goal: Maintenance of adequate nutrition will improve Outcome: Progressing Goal: Progress toward achieving an optimal weight will improve Outcome: Progressing   Problem: Skin Integrity: Goal: Risk for impaired skin integrity will decrease Outcome: Progressing   Problem: Tissue Perfusion: Goal: Adequacy of tissue perfusion will improve Outcome: Progressing   Problem: Education: Goal: Knowledge of disease or condition will improve Outcome: Progressing Goal: Knowledge of secondary prevention will improve (MUST DOCUMENT ALL) Outcome: Progressing Goal: Knowledge of patient specific risk factors will improve Loraine Leriche N/A or DELETE if not current risk factor) Outcome: Progressing   Problem: Ischemic Stroke/TIA Tissue Perfusion: Goal: Complications of ischemic stroke/TIA will be minimized Outcome: Progressing   Problem: Coping: Goal: Will verbalize positive feelings about self Outcome: Progressing Goal: Will identify appropriate support needs Outcome: Progressing   Problem: Health Behavior/Discharge Planning: Goal: Ability to manage health-related needs  will improve Outcome: Progressing Goal: Goals will be collaboratively established with patient/family Outcome: Progressing   Problem: Self-Care: Goal: Ability to participate in self-care as condition permits will improve Outcome: Progressing Goal: Verbalization of feelings and concerns over difficulty with self-care will improve Outcome: Progressing Goal: Ability to communicate needs accurately will improve Outcome: Progressing   Problem: Nutrition: Goal: Risk of aspiration will decrease Outcome: Progressing Goal: Dietary intake will improve Outcome: Progressing   Problem: Education: Goal: Knowledge of General Education information will improve Description: Including pain rating scale, medication(s)/side effects and non-pharmacologic comfort measures Outcome: Progressing   Problem: Health Behavior/Discharge Planning: Goal: Ability to manage health-related needs will improve Outcome: Progressing   Problem: Clinical Measurements: Goal: Ability to maintain clinical measurements within normal limits will improve Outcome: Progressing Goal: Will remain free from infection Outcome: Progressing Goal: Diagnostic test results will improve Outcome: Progressing Goal: Respiratory complications will improve Outcome: Progressing Goal: Cardiovascular complication will be avoided Outcome: Progressing   Problem: Activity: Goal: Risk for activity intolerance will decrease Outcome: Progressing   Problem: Nutrition: Goal: Adequate nutrition will be maintained Outcome: Progressing   Problem: Coping: Goal: Level of anxiety will decrease Outcome: Progressing   Problem: Elimination: Goal: Will not experience complications related to bowel motility Outcome: Progressing Goal: Will not experience complications related to urinary retention Outcome: Progressing   Problem: Pain Management: Goal: General experience of comfort will improve Outcome: Progressing   Problem: Safety: Goal:  Ability to remain free from injury will improve Outcome: Progressing   Problem: Skin Integrity: Goal: Risk for impaired skin integrity will decrease Outcome: Progressing

## 2023-06-14 ENCOUNTER — Observation Stay (HOSPITAL_BASED_OUTPATIENT_CLINIC_OR_DEPARTMENT_OTHER): Payer: No Typology Code available for payment source

## 2023-06-14 ENCOUNTER — Observation Stay (HOSPITAL_COMMUNITY): Payer: No Typology Code available for payment source

## 2023-06-14 DIAGNOSIS — E119 Type 2 diabetes mellitus without complications: Secondary | ICD-10-CM

## 2023-06-14 DIAGNOSIS — Z794 Long term (current) use of insulin: Secondary | ICD-10-CM | POA: Diagnosis not present

## 2023-06-14 DIAGNOSIS — J9611 Chronic respiratory failure with hypoxia: Secondary | ICD-10-CM | POA: Diagnosis not present

## 2023-06-14 DIAGNOSIS — I5042 Chronic combined systolic (congestive) and diastolic (congestive) heart failure: Secondary | ICD-10-CM | POA: Diagnosis not present

## 2023-06-14 DIAGNOSIS — I6389 Other cerebral infarction: Secondary | ICD-10-CM | POA: Diagnosis not present

## 2023-06-14 DIAGNOSIS — I639 Cerebral infarction, unspecified: Secondary | ICD-10-CM | POA: Diagnosis not present

## 2023-06-14 DIAGNOSIS — R531 Weakness: Secondary | ICD-10-CM | POA: Diagnosis not present

## 2023-06-14 DIAGNOSIS — I10 Essential (primary) hypertension: Secondary | ICD-10-CM | POA: Diagnosis not present

## 2023-06-14 LAB — CBC
HCT: 42.2 % (ref 39.0–52.0)
Hemoglobin: 13.7 g/dL (ref 13.0–17.0)
MCH: 28.5 pg (ref 26.0–34.0)
MCHC: 32.5 g/dL (ref 30.0–36.0)
MCV: 87.9 fL (ref 80.0–100.0)
Platelets: 245 10*3/uL (ref 150–400)
RBC: 4.8 MIL/uL (ref 4.22–5.81)
RDW: 12.7 % (ref 11.5–15.5)
WBC: 7.4 10*3/uL (ref 4.0–10.5)
nRBC: 0 % (ref 0.0–0.2)

## 2023-06-14 LAB — ECHOCARDIOGRAM COMPLETE
AV Mean grad: 2 mm[Hg]
AV Peak grad: 3.7 mm[Hg]
Ao pk vel: 0.96 m/s
Area-P 1/2: 5.13 cm2
Calc EF: 59.4 %
Height: 65 in
S' Lateral: 3.9 cm
Single Plane A2C EF: 63.1 %
Single Plane A4C EF: 57 %
Weight: 4880 [oz_av]

## 2023-06-14 LAB — GLUCOSE, CAPILLARY
Glucose-Capillary: 241 mg/dL — ABNORMAL HIGH (ref 70–99)
Glucose-Capillary: 313 mg/dL — ABNORMAL HIGH (ref 70–99)
Glucose-Capillary: 315 mg/dL — ABNORMAL HIGH (ref 70–99)
Glucose-Capillary: 319 mg/dL — ABNORMAL HIGH (ref 70–99)
Glucose-Capillary: 337 mg/dL — ABNORMAL HIGH (ref 70–99)
Glucose-Capillary: 365 mg/dL — ABNORMAL HIGH (ref 70–99)

## 2023-06-14 LAB — COMPREHENSIVE METABOLIC PANEL
ALT: 19 U/L (ref 0–44)
AST: 20 U/L (ref 15–41)
Albumin: 3.1 g/dL — ABNORMAL LOW (ref 3.5–5.0)
Alkaline Phosphatase: 48 U/L (ref 38–126)
Anion gap: 10 (ref 5–15)
BUN: 11 mg/dL (ref 6–20)
CO2: 26 mmol/L (ref 22–32)
Calcium: 8.8 mg/dL — ABNORMAL LOW (ref 8.9–10.3)
Chloride: 99 mmol/L (ref 98–111)
Creatinine, Ser: 0.95 mg/dL (ref 0.61–1.24)
GFR, Estimated: >60 mL/min (ref >60)
Glucose, Bld: 337 mg/dL — ABNORMAL HIGH (ref 70–99)
Potassium: 4.2 mmol/L (ref 3.5–5.1)
Sodium: 135 mmol/L (ref 135–145)
Total Bilirubin: 0.5 mg/dL (ref <1.2)
Total Protein: 6.1 g/dL — ABNORMAL LOW (ref 6.5–8.1)

## 2023-06-14 MED ORDER — INSULIN GLARGINE-YFGN 100 UNIT/ML ~~LOC~~ SOLN
30.0000 [IU] | Freq: Every day | SUBCUTANEOUS | Status: DC
  Administered 2023-06-14: 30 [IU] via SUBCUTANEOUS
  Filled 2023-06-14 (×2): qty 0.3

## 2023-06-14 MED ORDER — MORPHINE SULFATE (PF) 2 MG/ML IV SOLN
1.0000 mg | Freq: Once | INTRAVENOUS | Status: AC
  Administered 2023-06-14: 1 mg via INTRAVENOUS
  Filled 2023-06-14: qty 1

## 2023-06-14 MED ORDER — LORAZEPAM 2 MG/ML IJ SOLN
INTRAMUSCULAR | Status: AC
  Filled 2023-06-14: qty 1

## 2023-06-14 MED ORDER — PERFLUTREN LIPID MICROSPHERE
1.0000 mL | INTRAVENOUS | Status: AC | PRN
  Administered 2023-06-14: 3 mL via INTRAVENOUS

## 2023-06-14 MED ORDER — INSULIN ASPART 100 UNIT/ML IJ SOLN
6.0000 [IU] | Freq: Three times a day (TID) | INTRAMUSCULAR | Status: DC
  Administered 2023-06-14 – 2023-06-15 (×3): 6 [IU] via SUBCUTANEOUS

## 2023-06-14 MED ORDER — TICAGRELOR 90 MG PO TABS
90.0000 mg | ORAL_TABLET | Freq: Two times a day (BID) | ORAL | Status: DC
  Administered 2023-06-14 – 2023-06-15 (×3): 90 mg via ORAL
  Filled 2023-06-14 (×3): qty 1

## 2023-06-14 MED ORDER — LORAZEPAM 2 MG/ML IJ SOLN
0.5000 mg | Freq: Once | INTRAMUSCULAR | Status: AC
  Administered 2023-06-14: 0.5 mg via INTRAVENOUS

## 2023-06-14 MED ORDER — PROCHLORPERAZINE EDISYLATE 10 MG/2ML IJ SOLN
10.0000 mg | Freq: Once | INTRAMUSCULAR | Status: AC
  Administered 2023-06-14: 10 mg via INTRAVENOUS
  Filled 2023-06-14: qty 2

## 2023-06-14 NOTE — Progress Notes (Signed)
Physical Therapy Treatment Patient Details Name: Luis [name] MRN: [id number] DOB: [birth year] Today's Date: 06/14/2023   History of Present Illness Pt is a 52 y/o M presenting to ED from home with R sided weakness and decr snesation x5 days. Fell and hit head on dresser, blurred visioin. CTH negative, MRI pending. PMH includes combined CHF, ICD placement, VT, HTN, OSA on CPAP, PTSD, chronic hypoxic respiratory failure on 2.5L, GERD, anxiety, polysubstance abuse, DM2, morbid obesity    PT Comments  Patient session limited today due to multiple testing.  With SLP, then Echo so assisted to bed for Echo study.  Returned after pt back from MRI then for carotid ultrasound then FEES.  PT will follow up another day. Did assist with set up for O2 humidification as reports having nose bleeds.     If plan is discharge home, recommend the following: A lot of help with walking and/or transfers;A little help with bathing/dressing/bathroom   Can travel by private vehicle        Equipment Recommendations  Other (comment) (TBA)    Recommendations for Other Services       Precautions / Restrictions Precautions Precautions: Fall Precaution Comments: 2.5L O2 baseline     Mobility  Bed Mobility Overal bed mobility: Needs Assistance Bed Mobility: Sit to Supine       Sit to supine: Min assist   General bed mobility comments: crossed R leg over L for lifting them into bed, assist for repositioning and for legs over edge of the bed    Transfers Overall transfer level: Needs assistance Equipment used: Rolling walker (2 wheels) Transfers: Sit to/from Stand, Bed to chair/wheelchair/BSC Sit to Stand: Min assist   Step pivot transfers: Min assist       General transfer comment: up from recliner with increased time, assist for anterior weight shift and balance, stepping to bed with RW and A for balance, cues for positioning    Ambulation/Gait               General Gait Details:  deferred due to testing   Stairs             Wheelchair Mobility     Tilt Bed    Modified Rankin (Stroke Patients Only) Modified Rankin (Stroke Patients Only) Pre-Morbid Rankin Score: Slight disability Modified Rankin: Moderately severe disability     Balance Overall balance assessment: Needs assistance   Sitting balance-Leahy Scale: Good     Standing balance support: Bilateral upper extremity supported Standing balance-Leahy Scale: Poor Standing balance comment: reliant on external support                            Cognition Arousal: Alert Behavior During Therapy: WFL for tasks assessed/performed Overall Cognitive Status: Within Functional Limits for tasks assessed                                          Exercises      General Comments General comments (skin integrity, edema, etc.): on RA initially, patient relates had nose bleed due to dry air and asked to set up humidifier on O2.  Noted set up in room so assisted with set up while echo tech working with pt once back to bed      Pertinent Vitals/Pain Pain Assessment Pain Assessment: Faces Faces Pain Scale: Hurts little more  Pain Location: generalized with mobility Pain Descriptors / Indicators: Discomfort, Grimacing Pain Intervention(s): Monitored during session, Repositioned, Limited activity within patient's tolerance    Home Living                          Prior Function            PT Goals (current goals can now be found in the care plan section) Progress towards PT goals: Not progressing toward goals - comment    Frequency    Min 1X/week      PT Plan      Co-evaluation              AM-PAC PT "6 Clicks" Mobility   Outcome Measure  Help needed turning from your back to your side while in a flat bed without using bedrails?: A Little Help needed moving from lying on your back to sitting on the side of a flat bed without using bedrails?:  A Little Help needed moving to and from a bed to a chair (including a wheelchair)?: A Little Help needed standing up from a chair using your arms (e.g., wheelchair or bedside chair)?: A Little Help needed to walk in hospital room?: Total Help needed climbing 3-5 steps with a railing? : Total 6 Click Score: 14    End of Session   Activity Tolerance: Patient limited by fatigue (and due to testing) Patient left: in bed   PT Visit Diagnosis: Unsteadiness on feet (R26.81);Other abnormalities of gait and mobility (R26.89);Difficulty in walking, not elsewhere classified (R26.2)     Time: 1045-1100 PT Time Calculation (min) (ACUTE ONLY): 15 min  Charges:    $Therapeutic Activity: 8-22 mins PT General Charges $$ ACUTE PT VISIT: 1 Visit                     Sheran Lawless, PT Acute Rehabilitation Services Office:[phone] 06/14/2023    Elray Mcgregor 06/14/2023, 3:25 PM

## 2023-06-14 NOTE — Progress Notes (Signed)
Carotid arterial duplex completed. Please see CV Procedures for preliminary results.  Shona Simpson, RVT 06/14/23 3:38 PM

## 2023-06-14 NOTE — Progress Notes (Signed)
PROGRESS NOTE    Luis [name]  NUU:[id number] DOB: 10/21/1970 DOA: 06/12/2023 PCP: [facility]   Chief Complaint  Patient presents with   Weakness    Brief Narrative:  52 y.o. male with medical history significant of Combined CHF, ICD placement, VT, HTN, OSA on CPAP, chronic hypoxic respiratory failure on 2L, GERD, Anxiety, polysubstance abuse (tobacco, cocaine), T2DM, Morbid obesity who presents with right sided weakness.    Reports a left sided occipital/parietal headache for 3 days and then in past 2 days developed right sided upper and lower extremity weakness. Then today fell because his right leg gave out on him causing him to fall and hit his right forehead on a dresser. Says blood pressure has been high in the SBP 200s and has noted his glucose up to 500s. Also notes numbness to his feet. Has been nauseous but no vomiting. Has low appetite. Reports compliance with his Plavix.    He has a similar episode of right sided weakness in April and had negative CT head x2, MRA head and brain. Only thing positive for UDS for cocaine. There was documented concern for behavioral component conversion disorder to his exam as right sided weakness was not consistent with every exam. However due to his risk factors he was placed on Plavix.      Assessment & Plan:   Principal Problem:   Right sided weakness Active Problems:   HLD (hyperlipidemia)   Diabetes (HCC)   Hypertension   Morbid obesity (HCC)   Sleep apnea   Chronic respiratory failure with hypoxia (HCC) - 2.5 L/min   Biventricular ICD (implantable cardioverter-defibrillator) in place   Combined systolic and diastolic heart failure (HCC)   #1 right-sided weakness -Patient presented with a 3-day history of right-sided weakness prior preceded by left occipital headache. -Concern for acute CVA. 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 -CT head done negative for any acute abnormalities. -Carotid Dopplers done with no significant ICA stenosis. -2D echo done with a EF of 25 to 30%, left ventricular global hypokinesis, grade 1 diastolic dysfunction, mildly reduced right ventricular systolic function.  No atrial level shunt detected by color-flow Doppler. -Patient noted with ICD and as such MRI brain and MRA head obtained today 06/14/2023 with no acute intracranial abnormality, no intracranial large vessel occlusion, moderate narrowing in the A1 segment of the right ACA. -Neurology consulted and patient changed from clopidogrel to Brilinta 90 mg twice daily per neurology recommendations. -Neurology following and appreciate their input and recommendations.  2.  Chronic respiratory failure with hypoxia on 2.5 L/min -Stable.    3 sleep apnea -CPAP nightly.  4.  Morbid obesity -BMI 50.75 kg/m -Lifestyle modification. -Outpatient follow-up with PCP.  5.  Poorly controlled diabetes mellitus -Hemoglobin A1c 12 (06/13/2023) -Increase Semglee to 30 units daily. -Place on meal coverage NovoLog 6 units 3 times daily with meals. -SSI. -Diabetes coordinator following.  6.  Hyperlipidemia -Continue Crestor.  7.  Chronic combined systolic and diastolic CHF/status post ICD -Repeat 2D echo with ith a EF of 25 to 30%, left ventricular global hypokinesis, grade 1 diastolic dysfunction, mildly reduced right ventricular systolic function.  No atrial level shunt detected by color-flow Doppler. -Continue home regimen Bumex, digoxin, Coreg, spironolactone, Crestor,    DVT prophylaxis: Lovenox Code Status: Full Family Communication: Updated patient.  No  family at bedside. Disposition: TBD  Status is: Observation    Consultants:  Neurology: Dr. Otelia Limes 06/13/2023  Procedures:  2D echo 08/14/2022 Carotid Dopplers 06/14/2023 CT head  06/12/2023 MRI brain 06/14/2023 MRA angiogram head 06/14/2023  Antimicrobials:  Anti-infectives (From admission, onward)    None         Subjective: Patient laying in bed getting 2D echo done.  Still with complaints of right-sided weakness.  Still with complaints of left occipital headache states IV pain medication helped temporarily and headache came back.  Denies any chest pain or shortness of breath.  No abdominal pain.  No nausea or vomiting.  States was just seen by the neurologist.  Objective: Vitals:   06/14/23 0349 06/14/23 0730 06/14/23 1202 06/14/23 1629  BP: 133/78 (!) 140/78 (!) 148/83 134/64  Pulse: 72 91 91 88  Resp: 18 18 18    Temp: 97.8 F (36.6 C) 98.6 F (37 C) 98.6 F (37 C) 98.5 F (36.9 C)  TempSrc: Oral Oral Oral Oral  SpO2: 96% 94% 98% 97%  Weight:      Height:        Intake/Output Summary (Last 24 hours) at 06/14/2023 1720 Last data filed at 06/14/2023 1705 Gross per 24 hour  Intake 540 ml  Output 2550 ml  Net -2010 ml   Filed Weights   06/12/23 1529  Weight: (!) 138.3 kg    Examination:  General exam: NAD. Respiratory system: Clear to auscultation anterior lung fields.  No wheezes, no crackles, no rhonchi.  Fair air movement.  Speaking in full sentences.Marland Kitchen Respiratory effort normal. Cardiovascular system: S1 & S2 heard, RRR. No JVD, murmurs, rubs, gallops or clicks. No pedal edema. Gastrointestinal system: Abdomen is nondistended, soft and nontender. No organomegaly or masses felt. Normal bowel sounds heard. Central nervous system: Alert and oriented.  Cranial nerves II through XII grossly intact.  Gait not tested secondary to safety.  Right-sided weakness.  Extremities: Right-sided weakness.  Skin: No rashes, lesions or ulcers Psychiatry: Judgement and insight appear normal. Mood & affect appropriate.     Data Reviewed: I have personally reviewed following labs and imaging studies  CBC: Recent Labs  Lab 06/12/23 1539  06/14/23 0813  WBC 7.5 7.4  NEUTROABS 5.0  --   HGB 13.6 13.7  HCT 41.9 42.2  MCV 88.0 87.9  PLT 237 245    Basic Metabolic Panel: Recent Labs  Lab 06/12/23 1539 06/14/23 0813  NA 137 135  K 4.5 4.2  CL 99 99  CO2 24 26  GLUCOSE 421* 337*  BUN 10 11  CREATININE 0.86 0.95  CALCIUM 9.5 8.8*    GFR: Estimated Creatinine Clearance: 115.9 mL/min (by C-G formula based on SCr of 0.95 mg/dL).  Liver Function Tests: Recent Labs  Lab 06/12/23 1539 06/14/23 0813  AST 19 20  ALT 18 19  ALKPHOS 67 48  BILITOT 0.5 0.5  PROT 6.4* 6.1*  ALBUMIN 3.2* 3.1*    CBG: Recent Labs  Lab 06/14/23 0015 06/14/23 0621 06/14/23 0835 06/14/23 1135 06/14/23 1627  GLUCAP 337* 241* 319* 365* 313*     No results found for this or any previous visit (from the past 240 hours).       Radiology Studies: MR BRAIN WO CONTRAST Result Date: 06/14/2023 CLINICAL DATA:  Neuro deficit, acute, stroke suspected right sided weakness; Stroke, follow up EXAM: MRI HEAD WITHOUT CONTRAST MRA HEAD WITHOUT CONTRAST TECHNIQUE: Multiplanar, multi-echo pulse sequences of the brain and surrounding structures were acquired without intravenous  contrast. Angiographic images of the Circle of Willis were acquired using MRA technique without intravenous contrast. COMPARISON:  Same-day head CT. CTA head and neck angiogram 10/25/2022 FINDINGS: MRI HEAD FINDINGS Brain: Negative for an acute infarct. No hemorrhage. No hydrocephalus. No extra-axial fluid collection. No mass lesion. No mass effect. Vascular: Normal flow voids. Skull and upper cervical spine: Normal marrow signal. Sinuses/Orbits: No middle ear or mastoid effusion. Paranasal sinuses are notable for mucosal thickening of the floor of bilateral maxillary sinuses. Orbits are unremarkable. Other: None. MRA HEAD FINDINGS Anterior circulation: No aneurysm. No vascular malformation. No proximal occlusion. There is moderate narrowing in the A1 segment of the right ACA  Posterior circulation: No aneurysm. No proximal occlusion. No vascular malformation. Anatomic variants: None IMPRESSION: 1. No acute intracranial abnormality. 2. No intracranial large vessel occlusion. Moderate narrowing in the A1 segment of the right ACA. Electronically Signed   By: Lorenza Cambridge M.D.   On: 06/14/2023 15:53   MR ANGIO HEAD WO CONTRAST Result Date: 06/14/2023 CLINICAL DATA:  Neuro deficit, acute, stroke suspected right sided weakness; Stroke, follow up EXAM: MRI HEAD WITHOUT CONTRAST MRA HEAD WITHOUT CONTRAST TECHNIQUE: Multiplanar, multi-echo pulse sequences of the brain and surrounding structures were acquired without intravenous contrast. Angiographic images of the Circle of Willis were acquired using MRA technique without intravenous contrast. COMPARISON:  Same-day head CT. CTA head and neck angiogram 10/25/2022 FINDINGS: MRI HEAD FINDINGS Brain: Negative for an acute infarct. No hemorrhage. No hydrocephalus. No extra-axial fluid collection. No mass lesion. No mass effect. Vascular: Normal flow voids. Skull and upper cervical spine: Normal marrow signal. Sinuses/Orbits: No middle ear or mastoid effusion. Paranasal sinuses are notable for mucosal thickening of the floor of bilateral maxillary sinuses. Orbits are unremarkable. Other: None. MRA HEAD FINDINGS Anterior circulation: No aneurysm. No vascular malformation. No proximal occlusion. There is moderate narrowing in the A1 segment of the right ACA Posterior circulation: No aneurysm. No proximal occlusion. No vascular malformation. Anatomic variants: None IMPRESSION: 1. No acute intracranial abnormality. 2. No intracranial large vessel occlusion. Moderate narrowing in the A1 segment of the right ACA. Electronically Signed   By: Lorenza Cambridge M.D.   On: 06/14/2023 15:53   VAS US CAROTID Result Date: 06/14/2023 Carotid Arterial Duplex Study Patient Name:  MARQUIES MIMMS  Date of Exam:   06/14/2023 Medical Rec #: [id number]               Accession #:    [id number] Date of Birth: 1970/09/14               Patient Gender: M Patient Age:   89 years Exam Location:  [facility] Procedure:      VAS US CAROTID Referring Phys: Elmer Picker --------------------------------------------------------------------------------  Indications:       CVA. Risk Factors:      Hypertension, hyperlipidemia, Diabetes, current smoker, prior                    CVA. Comparison Study:  None Performing Technologist: Shona Simpson  Examination Guidelines: A complete evaluation includes B-mode imaging, spectral Doppler, color Doppler, and power Doppler as needed of all accessible portions of each vessel. Bilateral testing is considered an integral part of a complete examination. Limited examinations for reoccurring indications may be performed as noted.  Right Carotid Findings: +----------+--------+--------+--------+------------------+--------+           PSV cm/sEDV cm/sStenosisPlaque DescriptionComments +----------+--------+--------+--------+------------------+--------+ CCA Prox  106     18                                         +----------+--------+--------+--------+------------------+--------+  CCA Distal97      18                                         +----------+--------+--------+--------+------------------+--------+ ICA Prox  80      19      1-39%   hypoechoic                 +----------+--------+--------+--------+------------------+--------+ ICA Distal48      17                                         +----------+--------+--------+--------+------------------+--------+ ECA       112     16                                         +----------+--------+--------+--------+------------------+--------+ +----------+--------+-------+--------+-------------------+           PSV cm/sEDV cmsDescribeArm Pressure (mmHG) +----------+--------+-------+--------+-------------------+ [id number]     0                                   +----------+--------+-------+--------+-------------------+ +---------+--------+--+--------+--+ VertebralPSV cm/s46EDV cm/s13 +---------+--------+--+--------+--+  Left Carotid Findings: +----------+--------+--------+--------+------------------+--------+           PSV cm/sEDV cm/sStenosisPlaque DescriptionComments +----------+--------+--------+--------+------------------+--------+ CCA Prox  133     19                                         +----------+--------+--------+--------+------------------+--------+ CCA Distal113     22                                         +----------+--------+--------+--------+------------------+--------+ ICA Prox  94      34      1-39%   smooth                     +----------+--------+--------+--------+------------------+--------+ ICA Distal65      23                                         +----------+--------+--------+--------+------------------+--------+ ECA       109     15                                         +----------+--------+--------+--------+------------------+--------+ +----------+--------+--------+--------+-------------------+           PSV cm/sEDV cm/sDescribeArm Pressure (mmHG) +----------+--------+--------+--------+-------------------+ Subclavian173     0                                   +----------+--------+--------+--------+-------------------+ +---------+--------+--+--------+--+ VertebralPSV cm/s43EDV cm/s13 +---------+--------+--+--------+--+   Summary: Right Carotid: Velocities in the right ICA are consistent with a 1-39% stenosis. Left Carotid: Velocities in the left ICA are consistent  with a 1-39% stenosis. Vertebrals:  Bilateral vertebral arteries demonstrate antegrade flow. Subclavians: Normal flow hemodynamics were seen in bilateral subclavian              arteries. *See table(s) above for measurements and observations.     Preliminary    ECHOCARDIOGRAM COMPLETE Result Date: 06/14/2023     ECHOCARDIOGRAM REPORT   Patient Name:   BILLION MCVICKER Date of Exam: 06/14/2023 Medical Rec #:  [id number]             Height:       65.0 in Accession #:    [id number]            Weight:       305.0 lb Date of Birth:  Feb 25, 1971              BSA:          2.367 m Patient Age:    52 years              BP:           140/78 mmHg Patient Gender: M                     HR:           87 bpm. Exam Location:  Inpatient Procedure: 2D Echo, Cardiac Doppler and Color Doppler Indications:    stroke  History:        Patient has prior history of Echocardiogram examinations, most                 recent 02/27/2022. CHF, Defibrillator and Pacemaker; Risk                 Factors:Dyslipidemia, Hypertension and Diabetes.  Sonographer:    Webb Laws Referring Phys: [id number] PRAMOD S SETHI IMPRESSIONS  1. Left ventricular ejection fraction, by estimation, is 25 to 30%. The left ventricle has normal function. The left ventricle demonstrates global hypokinesis. Left ventricular diastolic parameters are consistent with Grade I diastolic dysfunction (impaired relaxation).  2. Right ventricular systolic function is mildly reduced. The right ventricular size is normal. Tricuspid regurgitation signal is inadequate for assessing PA pressure.  3. The mitral valve is normal in structure. No evidence of mitral valve regurgitation.  4. The aortic valve is tricuspid. Aortic valve regurgitation is not visualized. No aortic stenosis is present.  5. The inferior vena cava is normal in size with greater than 50% respiratory variability, suggesting right atrial pressure of 3 mmHg. FINDINGS  Left Ventricle: Left ventricular ejection fraction, by estimation, is 25 to 30%. The left ventricle has normal function. The left ventricle demonstrates global hypokinesis. Definity contrast agent was given IV to delineate the left ventricular endocardial borders. The left ventricular internal cavity size was normal in size. There is no left ventricular  hypertrophy. Left ventricular diastolic parameters are consistent with Grade I diastolic dysfunction (impaired relaxation). Right Ventricle: The right ventricular size is normal. No increase in right ventricular wall thickness. Right ventricular systolic function is mildly reduced. Tricuspid regurgitation signal is inadequate for assessing PA pressure. Left Atrium: Left atrial size was normal in size. Right Atrium: Right atrial size was normal in size. Pericardium: There is no evidence of pericardial effusion. Mitral Valve: The mitral valve is normal in structure. No evidence of mitral valve regurgitation. Tricuspid Valve: The tricuspid valve is normal in structure. Tricuspid valve regurgitation is not demonstrated. Aortic Valve: The aortic valve is tricuspid. Aortic valve regurgitation is  not visualized. No aortic stenosis is present. Aortic valve mean gradient measures 2.0 mmHg. Aortic valve peak gradient measures 3.7 mmHg. Pulmonic Valve: The pulmonic valve was normal in structure. Pulmonic valve regurgitation is not visualized. Aorta: The aortic root is normal in size and structure. Venous: The inferior vena cava is normal in size with greater than 50% respiratory variability, suggesting right atrial pressure of 3 mmHg. IAS/Shunts: No atrial level shunt detected by color flow Doppler. Additional Comments: A device lead is visualized in the right ventricle.  LEFT VENTRICLE PLAX 2D LVIDd:         5.00 cm      Diastology LVIDs:         3.90 cm      LV e' medial:   6.53 cm/s LV PW:         1.10 cm      LV E/e' medial: 12.8 LV IVS:        0.90 cm  LV Volumes (MOD) LV vol d, MOD A2C: 148.0 ml LV vol d, MOD A4C: 203.0 ml LV vol s, MOD A2C: 54.6 ml LV vol s, MOD A4C: 87.2 ml LV SV MOD A2C:     93.4 ml LV SV MOD A4C:     203.0 ml LV SV MOD BP:      105.5 ml RIGHT VENTRICLE RV Basal diam:  3.70 cm RV S prime:     13.70 cm/s TAPSE (M-mode): 1.8 cm LEFT ATRIUM             Index        RIGHT ATRIUM          Index LA diam:         4.00 cm 1.69 cm/m   RA Area:     9.39 cm LA Vol (A2C):   37.7 ml 15.93 ml/m  RA Volume:   15.90 ml 6.72 ml/m LA Vol (A4C):   20.7 ml 8.75 ml/m LA Biplane Vol: 27.9 ml 11.79 ml/m  AORTIC VALVE AV Vmax:      96.20 cm/s AV Vmean:     66.900 cm/s AV VTI:       0.159 m AV Peak Grad: 3.7 mmHg AV Mean Grad: 2.0 mmHg  AORTA Ao Root diam: 2.70 cm Ao Asc diam:  3.20 cm MITRAL VALVE MV Area (PHT): 5.13 cm MV Decel Time: 148 msec MV E velocity: 83.80 cm/s MV A velocity: 99.00 cm/s MV E/A ratio:  0.85 Dalton McleanMD Electronically signed by Wilfred Lacy Signature Date/Time: 06/14/2023/12:22:35 PM    Final         Scheduled Meds:   stroke: early stages of recovery book   Does not apply Once   bumetanide  2 mg Oral BID   carvedilol  3.125 mg Oral BID WC   digoxin  0.125 mg Oral Daily   enoxaparin (LOVENOX) injection  40 mg Subcutaneous Q24H   insulin aspart  0-20 Units Subcutaneous TID PC & HS   insulin aspart  6 Units Subcutaneous TID WC   insulin glargine-yfgn  30 Units Subcutaneous QHS   nystatin cream   Topical BID   pantoprazole  40 mg Oral Daily   potassium chloride SA  20 mEq Oral Daily   rosuvastatin  40 mg Oral Daily   spironolactone  25 mg Oral Daily   ticagrelor  90 mg Oral BID   Continuous Infusions:   LOS: 0 days    Time spent: 40 minutes    Ramiro Harvest, MD [facility]   To contact the attending provider between 7A-7P or the covering provider during after hours 7P-7A, please log into the web site [email or web address] and access using universal [place] password for that web site. If you do not have the password, please call the hospital operator.  06/14/2023, 5:20 PM

## 2023-06-14 NOTE — Progress Notes (Addendum)
Speech Language Pathology Treatment: Dysphagia  Patient Details Name: Luis [name] MRN: [id number] DOB: 12/21/1970 Today's Date: 06/14/2023 Time: 8657-8469 SLP Time Calculation (min) (ACUTE ONLY): 25 min  Assessment / Plan / Recommendation Clinical Impression  Pt seen for dysphagia f/u tx session with intake of various consistencies including thin, puree and soft solids (peaches, mixed consistency) with immediate cough noted with larger volume of thin, but eliminated with small sips via cup/tsp and min verbal cueing.  Delayed cough observed with mixed consistency of peaches/syrup, but ice chips (single) and puree without overt s/sx of aspiration present.  Pt did c/o globus sensation on R once during po intake and potential velopharyngeal involvement with nasal regurgitation reported during intake of thin, but not observed during session.  Pt required min verbal cues for impulsivity with soft solids and larger volume of thin which elicited delayed cough response.  Pt able to reiterate precautions at end of session with min cues from SLP.  Recommend continue Dysphagia 3/thin liquids with swallowing precautions in place and intermittent supervision during meals/snacks.  ST will continue to f/u for diet tolerance during meals/education and potential need for objective assessment during hospital stay if symptoms persist.     HPI HPI: Luis [name] is a right handed 52 y.o. male with medical history significant of Combined CHF, ICD placement, VT, HTN, OSA on CPAP, chronic hypoxic respiratory failure on 2L, GERD, Anxiety, polysubstance abuse (tobacco, cocaine), T2DM, Morbid obesity who presents with right sided weakness. Pt with BSE and SLE in 2022 with grossly functional oropharyngeal swallowing abilities with noted cognitive deficits in the areas of orientation, executive functioning and delayed recall. Head CT unremarkable for acute abnormalities. MRI pending; BSE completed on 06/13/23 with  recs for Dysphagia 3/thin and potential for MBS.  ST f/u for dysphagia tx.      SLP Plan  Continue with current plan of care;Other (Comment) (potential for MBS)      Recommendations for follow up therapy are one component of a multi-disciplinary discharge planning process, led by the attending physician.  Recommendations may be updated based on patient status, additional functional criteria and insurance authorization.    Recommendations  Diet recommendations: Dysphagia 3 (mechanical soft);Thin liquid Liquids provided via: Cup Medication Administration: Whole meds with liquid (larger pills (ie: potassium) split in puree prn) Supervision: Patient able to self feed;Intermittent supervision to cue for compensatory strategies Compensations: Minimize environmental distractions;Slow rate;Small sips/bites                Rehab consult Oral care BID   Intermittent Supervision/Assistance Dysphagia, unspecified (R13.10)     Continue with current plan of care;Other (Comment) (potential for MBS)     Pat Daanish Copes,M.S.,CCC-SLP  06/14/2023, 12:13 PM

## 2023-06-14 NOTE — Inpatient Diabetes Management (Signed)
Inpatient Diabetes Program Recommendations  AACE/ADA: New Consensus Statement on Inpatient Glycemic Control (2015)  Target Ranges:  Prepandial:   less than 140 mg/dL      Peak postprandial:   less than 180 mg/dL (1-2 hours)      Critically ill patients:  140 - 180 mg/dL   Lab Results  Component Value Date   GLUCAP 319 (H) 06/14/2023   HGBA1C 12.0 (H) 06/13/2023   History: DM2, Cocaine Abuse, CHF, CVA   Home DM Meds: Farxiga 10 mg daily                             Novolog 70/30 Mix Insulin 70 units BID                             Lantus 25 units QPM   Current Orders: Novolog Resistant Correction Scale/ SSI (0-20 units) TID AC + HS                           Semglee 20 units at bedtime   MD- Please consider starting Novolog Meal Coverage:  Novolog 6 units TID with meals to start HOLD if pt NPO HOLD if pt eats <50% meals Increase Semglee 30 units nightly  Thank you, [name] E. Ahmaad Neidhardt, RN, MSN, CDCES  Diabetes Coordinator Inpatient Glycemic Control Team Team Pager [phone] (8am-5pm) 06/14/2023 9:14 AM

## 2023-06-14 NOTE — Progress Notes (Signed)
STROKE TEAM PROGRESS NOTE   BRIEF HPI [name] is a 52 y.o. male with history of Anxiety, Biventricular ICD (implantable cardioverter-defibrillator) in place, Chronic systolic CHF (congestive heart failure) (HCC), Cocaine use, Diabetes mellitus without complication (HCC), GERD (gastroesophageal reflux disease), HLD (hyperlipidemia), Hypertension, Morbid obesity (HCC), NICM (nonischemic cardiomyopathy) (HCC), OSA (obstructive sleep apnea), PTSD (post-traumatic stress disorder), Refusal of blood transfusions as patient is Jehovah's Witness, and Tobacco abuse., as well as stroke with mild right sided deficit who [facility] afternoon with multiple complaints, including 4 to 5 days of left-sided headache with right-sided weakness, increased shortness of breath especially when laying flat, increased lower extremity edema, high blood pressures up to 200 systolic at home and elevated blood sugars has into the 4 and 500s despite reported compliance with medications. He also states that he had a fall that occurred approximately 4 days ago with mild head injury at the time. Does not endorse any vision loss, dysarthria, aphasia or confusion. Neurology was consulted for right sided weakness that was noted by both EDP and Hospitalist.   NIH on Admission 3  SIGNIFICANT HOSPITAL EVENTS   INTERIM HISTORY/SUBJECTIVE MRI today given pacemaker,  Still having right sided weakness and diminished sensation.  He does admit to having sleep apnea but has not been using his CPAP mask but is willing to try again and see primary care physician for a new 1.  Vital signs stable.  Neurological exam unchanged OBJECTIVE  CBC    Component Value Date/Time   WBC 7.4 06/14/2023 0813   RBC 4.80 06/14/2023 0813   HGB 13.7 06/14/2023 0813   HCT 42.2 06/14/2023 0813   PLT 245 06/14/2023 0813   MCV 87.9 06/14/2023 0813   MCH 28.5 06/14/2023 0813   MCHC 32.5 06/14/2023 0813   RDW 12.7  06/14/2023 0813   LYMPHSABS 1.8 06/12/2023 1539   MONOABS 0.7 06/12/2023 1539   EOSABS 0.1 06/12/2023 1539   BASOSABS 0.0 06/12/2023 1539    BMET    Component Value Date/Time   NA 135 06/14/2023 0813   NA 142 07/13/2022 1436   K 4.2 06/14/2023 0813   CL 99 06/14/2023 0813   CO2 26 06/14/2023 0813   GLUCOSE 337 (H) 06/14/2023 0813   BUN 11 06/14/2023 0813   BUN 9 07/13/2022 1436   CREATININE 0.95 06/14/2023 0813   CALCIUM 8.8 (L) 06/14/2023 0813   EGFR 79 07/13/2022 1436   GFRNONAA >60 06/14/2023 0813    IMAGING past 24 hours No results found.   Vitals:   06/14/23 0014 06/14/23 0349 06/14/23 0730 06/14/23 1202  BP: (!) 140/61 133/78 (!) 140/78 (!) 148/83  Pulse: 74 72 91 91  Resp: 16 18 18 18   Temp: 98.5 F (36.9 C) 97.8 F (36.6 C) 98.6 F (37 C) 98.6 F (37 C)  TempSrc: Oral Oral Oral Oral  SpO2: 97% 96% 94% 98%  Weight:      Height:         PHYSICAL EXAM General:  Alert, well-nourished, well-developed patient  Psych: tearful throughout exam, patient expresses anxiety regarding health  CV: Regular rate and rhythm on monitor Respiratory:  Regular, unlabored respirations, Ruso in place GI: Abdomen soft and nontender   NEURO:  Mental Status: AA&Ox3, patient is able to give clear and coherent history Speech/Language: speech is without dysarthria or aphasia.  Naming, repetition, fluency, and comprehension intact.  Cranial Nerves:  II: PERRL. Visual fields full.  III, IV, VI: EOMI.  Eyelids elevate symmetrically.  V: Sensation is intact to light touch and symmetrical to face.  VII: Face is symmetrical resting and smiling VIII: hearing intact to voice. IX, X: Palate elevates symmetrically. Phonation is normal.  OZ:HYQMVHQI shrug 5/5. XII: tongue is midline without fasciculations. Motor: LUE 5/5 proximally and distally RUE 4-/5 deltoid, 4/5 biceps and triceps, 4/5 grip..  Trace right upper extremity drift LLE 5/5 proximally and distally RLE Unable to  elevate thigh off bed in sitting position: HF 2/5. KE 3/5, KF 3/5, unable to dorsiflex or plantar flex foot.  Some giveway weakness suggesting a functional overlay to exam Sensory: Decreased temp and FT sensation to thigh and lower leg on the right.  Normal sensation to RUE shoulder, upper arm and forearm, but was apparently insensate to FT and deep pressure to the same limb.  LUE and LLE subjectively with normal temp and FT sensation.  Cerebellar: No ataxia with FNF bilaterally, but slower on the right. No action or rest tremor.   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   MRI  pending  Carotid Doppler  pending  2D Echo pending  LDL 72 HgbA1c 12.0 VTE prophylaxis - lovenox clopidogrel 75 mg daily prior to admission, now on Brilinta 90 mg twice daily ASA allergy- anaphylaxis Therapy recommendations:  Pending Disposition:  pending completion of work up   Congestive heart failure  Last echocardiogram on 02/2022 with a EF of 25%, global hypokinesis of the left ventricle, indeterminate diastolic function.  No significant valvular abnormalities Updated echo pending  continue Bumex, digoxin, beta blocker s/p ICD  Chronic respiratory failure with hypoxia  2L [place]   Hyperlipidemia Home meds:  Crestor, resumed in hospital LDL 72, goal < 70 Continue statin at discharge  Diabetes type II Controlled Home meds:  insulin, farxiga HgbA1c 12.0, goal < 7.0 CBGs SSI Recommend close follow-up with PCP for better DM control  Other Stroke Risk Factors Obesity, Body mass index is 50.75 kg/m., BMI >/= 30 associated with increased stroke risk, recommend weight loss, diet and exercise as appropriate   Hospital day # 0   I have personally obtained history,examined this patient, reviewed notes, independently viewed imaging studies,  participated in medical decision making and plan of care.ROS completed by me personally and pertinent positives fully documented  I have made any additions or clarifications directly to the above note. Agree with note above.  Patient has history of aspirin allergy and has been on Plavix.  Presents with right-sided weakness likely due to suspected left subcortical infarct not visualized on CT.  MRI is pending today.  Continue echocardiogram and carotid ultrasound.  Recommend switch Plavix to Brilinta for stroke prevention if he can afford it.  Maintain aggressive risk factor modification.  Patient counseled to be compliant with using his CPAP for sleep apnea.  Greater than 50% time during this 35-minute visit was spent in counseling and coordination of care about his stroke and discussion about stroke evaluation, prevention and treatment questions.  Discussed with Dr. Dan Humphreys, MD Medical Director [facility] Pager: [phone] 06/14/2023 12:08 PM   To contact Stroke Continuity provider, please refer to [email or web address]. After hours, contact General Neurology

## 2023-06-14 NOTE — Plan of Care (Signed)
  Problem: Education: Goal: Ability to describe self-care measures that may prevent or decrease complications (Diabetes Survival Skills Education) will improve Outcome: Progressing Goal: Individualized Educational Video(s) Outcome: Progressing   Problem: Coping: Goal: Ability to adjust to condition or change in health will improve Outcome: Progressing   Problem: Fluid Volume: Goal: Ability to maintain a balanced intake and output will improve Outcome: Progressing   Problem: Health Behavior/Discharge Planning: Goal: Ability to identify and utilize available resources and services will improve Outcome: Progressing Goal: Ability to manage health-related needs will improve Outcome: Progressing   Problem: Metabolic: Goal: Ability to maintain appropriate glucose levels will improve Outcome: Progressing   Problem: Nutritional: Goal: Maintenance of adequate nutrition will improve Outcome: Progressing Goal: Progress toward achieving an optimal weight will improve Outcome: Progressing   Problem: Skin Integrity: Goal: Risk for impaired skin integrity will decrease Outcome: Progressing   Problem: Tissue Perfusion: Goal: Adequacy of tissue perfusion will improve Outcome: Progressing   Problem: Education: Goal: Knowledge of disease or condition will improve Outcome: Progressing Goal: Knowledge of secondary prevention will improve (MUST DOCUMENT ALL) Outcome: Progressing Goal: Knowledge of patient specific risk factors will improve Loraine Leriche N/A or DELETE if not current risk factor) Outcome: Progressing   Problem: Ischemic Stroke/TIA Tissue Perfusion: Goal: Complications of ischemic stroke/TIA will be minimized Outcome: Progressing   Problem: Coping: Goal: Will verbalize positive feelings about self Outcome: Progressing Goal: Will identify appropriate support needs Outcome: Progressing   Problem: Health Behavior/Discharge Planning: Goal: Ability to manage health-related needs  will improve Outcome: Progressing Goal: Goals will be collaboratively established with patient/family Outcome: Progressing   Problem: Self-Care: Goal: Ability to participate in self-care as condition permits will improve Outcome: Progressing Goal: Verbalization of feelings and concerns over difficulty with self-care will improve Outcome: Progressing Goal: Ability to communicate needs accurately will improve Outcome: Progressing   Problem: Nutrition: Goal: Risk of aspiration will decrease Outcome: Progressing Goal: Dietary intake will improve Outcome: Progressing   Problem: Education: Goal: Knowledge of General Education information will improve Description: Including pain rating scale, medication(s)/side effects and non-pharmacologic comfort measures Outcome: Progressing   Problem: Health Behavior/Discharge Planning: Goal: Ability to manage health-related needs will improve Outcome: Progressing   Problem: Clinical Measurements: Goal: Ability to maintain clinical measurements within normal limits will improve Outcome: Progressing Goal: Will remain free from infection Outcome: Progressing Goal: Diagnostic test results will improve Outcome: Progressing Goal: Respiratory complications will improve Outcome: Progressing Goal: Cardiovascular complication will be avoided Outcome: Progressing   Problem: Activity: Goal: Risk for activity intolerance will decrease Outcome: Progressing   Problem: Nutrition: Goal: Adequate nutrition will be maintained Outcome: Progressing   Problem: Coping: Goal: Level of anxiety will decrease Outcome: Progressing   Problem: Elimination: Goal: Will not experience complications related to bowel motility Outcome: Progressing Goal: Will not experience complications related to urinary retention Outcome: Progressing   Problem: Pain Management: Goal: General experience of comfort will improve Outcome: Progressing   Problem: Safety: Goal:  Ability to remain free from injury will improve Outcome: Progressing   Problem: Skin Integrity: Goal: Risk for impaired skin integrity will decrease Outcome: Progressing

## 2023-06-14 NOTE — Progress Notes (Signed)
Inpatient Rehab Admissions Coordinator:  ? ?Per therapy recommendations,  patient was screened for CIR candidacy by Devaney Segers, MS, CCC-SLP. At this time, Pt. Appears to be a a potential candidate for CIR. I will place   order for rehab consult per protocol for full assessment. Please contact me any with questions. ? ?Trine Fread, MS, CCC-SLP ?Rehab Admissions Coordinator  ?[phone] (celll) ?[phone] (office) ? ?

## 2023-06-15 ENCOUNTER — Ambulatory Visit: Payer: Medicare HMO | Admitting: Adult Health

## 2023-06-15 DIAGNOSIS — E785 Hyperlipidemia, unspecified: Secondary | ICD-10-CM | POA: Diagnosis present

## 2023-06-15 DIAGNOSIS — E782 Mixed hyperlipidemia: Secondary | ICD-10-CM

## 2023-06-15 DIAGNOSIS — I5042 Chronic combined systolic (congestive) and diastolic (congestive) heart failure: Secondary | ICD-10-CM | POA: Diagnosis present

## 2023-06-15 DIAGNOSIS — F449 Dissociative and conversion disorder, unspecified: Secondary | ICD-10-CM | POA: Diagnosis present

## 2023-06-15 DIAGNOSIS — Z885 Allergy status to narcotic agent status: Secondary | ICD-10-CM | POA: Diagnosis not present

## 2023-06-15 DIAGNOSIS — K219 Gastro-esophageal reflux disease without esophagitis: Secondary | ICD-10-CM | POA: Diagnosis present

## 2023-06-15 DIAGNOSIS — I110 Hypertensive heart disease with heart failure: Secondary | ICD-10-CM | POA: Diagnosis present

## 2023-06-15 DIAGNOSIS — R531 Weakness: Secondary | ICD-10-CM

## 2023-06-15 DIAGNOSIS — Z886 Allergy status to analgesic agent status: Secondary | ICD-10-CM | POA: Diagnosis not present

## 2023-06-15 DIAGNOSIS — I420 Dilated cardiomyopathy: Secondary | ICD-10-CM | POA: Diagnosis not present

## 2023-06-15 DIAGNOSIS — Z8249 Family history of ischemic heart disease and other diseases of the circulatory system: Secondary | ICD-10-CM | POA: Diagnosis not present

## 2023-06-15 DIAGNOSIS — Z794 Long term (current) use of insulin: Secondary | ICD-10-CM | POA: Diagnosis not present

## 2023-06-15 DIAGNOSIS — I10 Essential (primary) hypertension: Secondary | ICD-10-CM | POA: Diagnosis not present

## 2023-06-15 DIAGNOSIS — H938X2 Other specified disorders of left ear: Secondary | ICD-10-CM | POA: Diagnosis not present

## 2023-06-15 DIAGNOSIS — E8770 Fluid overload, unspecified: Secondary | ICD-10-CM | POA: Diagnosis not present

## 2023-06-15 DIAGNOSIS — Z91010 Allergy to peanuts: Secondary | ICD-10-CM | POA: Diagnosis not present

## 2023-06-15 DIAGNOSIS — Z888 Allergy status to other drugs, medicaments and biological substances status: Secondary | ICD-10-CM | POA: Diagnosis not present

## 2023-06-15 DIAGNOSIS — Z9981 Dependence on supplemental oxygen: Secondary | ICD-10-CM | POA: Diagnosis not present

## 2023-06-15 DIAGNOSIS — J9611 Chronic respiratory failure with hypoxia: Secondary | ICD-10-CM

## 2023-06-15 DIAGNOSIS — Z880 Allergy status to penicillin: Secondary | ICD-10-CM | POA: Diagnosis not present

## 2023-06-15 DIAGNOSIS — Z95810 Presence of automatic (implantable) cardiac defibrillator: Secondary | ICD-10-CM

## 2023-06-15 DIAGNOSIS — I639 Cerebral infarction, unspecified: Secondary | ICD-10-CM | POA: Diagnosis not present

## 2023-06-15 DIAGNOSIS — G4733 Obstructive sleep apnea (adult) (pediatric): Secondary | ICD-10-CM | POA: Diagnosis present

## 2023-06-15 DIAGNOSIS — I69351 Hemiplegia and hemiparesis following cerebral infarction affecting right dominant side: Secondary | ICD-10-CM | POA: Diagnosis not present

## 2023-06-15 DIAGNOSIS — Z808 Family history of malignant neoplasm of other organs or systems: Secondary | ICD-10-CM | POA: Diagnosis not present

## 2023-06-15 DIAGNOSIS — I5023 Acute on chronic systolic (congestive) heart failure: Secondary | ICD-10-CM | POA: Diagnosis not present

## 2023-06-15 DIAGNOSIS — I428 Other cardiomyopathies: Secondary | ICD-10-CM | POA: Diagnosis present

## 2023-06-15 DIAGNOSIS — Z91040 Latex allergy status: Secondary | ICD-10-CM | POA: Diagnosis not present

## 2023-06-15 DIAGNOSIS — Z91041 Radiographic dye allergy status: Secondary | ICD-10-CM | POA: Diagnosis not present

## 2023-06-15 DIAGNOSIS — Z6841 Body Mass Index (BMI) 40.0 and over, adult: Secondary | ICD-10-CM | POA: Diagnosis not present

## 2023-06-15 DIAGNOSIS — F17210 Nicotine dependence, cigarettes, uncomplicated: Secondary | ICD-10-CM | POA: Diagnosis present

## 2023-06-15 DIAGNOSIS — I5041 Acute combined systolic (congestive) and diastolic (congestive) heart failure: Secondary | ICD-10-CM | POA: Diagnosis not present

## 2023-06-15 DIAGNOSIS — E1165 Type 2 diabetes mellitus with hyperglycemia: Secondary | ICD-10-CM

## 2023-06-15 DIAGNOSIS — J9610 Chronic respiratory failure, unspecified whether with hypoxia or hypercapnia: Secondary | ICD-10-CM | POA: Diagnosis not present

## 2023-06-15 DIAGNOSIS — F1410 Cocaine abuse, uncomplicated: Secondary | ICD-10-CM | POA: Diagnosis not present

## 2023-06-15 LAB — BASIC METABOLIC PANEL
Anion gap: 8 (ref 5–15)
BUN: 15 mg/dL (ref 6–20)
CO2: 24 mmol/L (ref 22–32)
Calcium: 8.7 mg/dL — ABNORMAL LOW (ref 8.9–10.3)
Chloride: 101 mmol/L (ref 98–111)
Creatinine, Ser: 0.86 mg/dL (ref 0.61–1.24)
GFR, Estimated: >60 mL/min (ref >60)
Glucose, Bld: 287 mg/dL — ABNORMAL HIGH (ref 70–99)
Potassium: 3.6 mmol/L (ref 3.5–5.1)
Sodium: 133 mmol/L — ABNORMAL LOW (ref 135–145)

## 2023-06-15 LAB — CBC
HCT: 39.4 % (ref 39.0–52.0)
Hemoglobin: 12.9 g/dL — ABNORMAL LOW (ref 13.0–17.0)
MCH: 28.7 pg (ref 26.0–34.0)
MCHC: 32.7 g/dL (ref 30.0–36.0)
MCV: 87.6 fL (ref 80.0–100.0)
Platelets: 235 10*3/uL (ref 150–400)
RBC: 4.5 MIL/uL (ref 4.22–5.81)
RDW: 13 % (ref 11.5–15.5)
WBC: 7.9 10*3/uL (ref 4.0–10.5)
nRBC: 0 % (ref 0.0–0.2)

## 2023-06-15 LAB — GLUCOSE, CAPILLARY
Glucose-Capillary: 277 mg/dL — ABNORMAL HIGH (ref 70–99)
Glucose-Capillary: 360 mg/dL — ABNORMAL HIGH (ref 70–99)
Glucose-Capillary: 301 mg/dL — ABNORMAL HIGH (ref 70–99)
Glucose-Capillary: 265 mg/dL — ABNORMAL HIGH (ref 70–99)

## 2023-06-15 MED ORDER — INSULIN ASPART 100 UNIT/ML IJ SOLN
10.0000 [IU] | Freq: Three times a day (TID) | INTRAMUSCULAR | Status: DC
  Administered 2023-06-15 – 2023-06-16 (×3): 10 [IU] via SUBCUTANEOUS

## 2023-06-15 MED ORDER — INSULIN GLARGINE-YFGN 100 UNIT/ML ~~LOC~~ SOLN: 34.0000 [IU] | Freq: Every day | SUBCUTANEOUS | Status: DC

## 2023-06-15 MED ORDER — PROCHLORPERAZINE EDISYLATE 10 MG/2ML IJ SOLN: 10.0000 mg | Freq: Four times a day (QID) | INTRAMUSCULAR | Status: DC | PRN

## 2023-06-15 MED ORDER — INSULIN ASPART 100 UNIT/ML IJ SOLN: 8.0000 [IU] | Freq: Three times a day (TID) | INTRAMUSCULAR | Status: DC

## 2023-06-15 MED ORDER — VITAMIN B-6 100 MG PO TABS
100.0000 mg | ORAL_TABLET | Freq: Every day | ORAL | Status: DC
  Administered 2023-06-15 – 2023-06-16 (×2): 100 mg via ORAL
  Filled 2023-06-15 (×2): qty 1

## 2023-06-15 MED ORDER — LIVING WELL WITH DIABETES BOOK
Freq: Once | Status: AC
  Filled 2023-06-15: qty 1

## 2023-06-15 MED ORDER — MORPHINE SULFATE (PF) 2 MG/ML IV SOLN: 1.0000 mg | INTRAVENOUS | Status: DC | PRN

## 2023-06-15 MED ORDER — CLOPIDOGREL BISULFATE 75 MG PO TABS
75.0000 mg | ORAL_TABLET | Freq: Every day | ORAL | Status: DC
  Administered 2023-06-15 – 2023-06-16 (×2): 75 mg via ORAL
  Filled 2023-06-15 (×2): qty 1

## 2023-06-15 MED ORDER — INSULIN GLARGINE-YFGN 100 UNIT/ML ~~LOC~~ SOLN
40.0000 [IU] | Freq: Every day | SUBCUTANEOUS | Status: DC
  Administered 2023-06-15: 40 [IU] via SUBCUTANEOUS
  Filled 2023-06-15 (×2): qty 0.4

## 2023-06-15 MED ORDER — ISONIAZID 300 MG PO TABS
300.0000 mg | ORAL_TABLET | Freq: Every day | ORAL | Status: DC
  Administered 2023-06-15 – 2023-06-16 (×2): 300 mg via ORAL
  Filled 2023-06-15 (×2): qty 1

## 2023-06-15 MED ORDER — PRAZOSIN HCL 2 MG PO CAPS
2.0000 mg | ORAL_CAPSULE | Freq: Every day | ORAL | Status: DC
  Administered 2023-06-15: 2 mg via ORAL
  Filled 2023-06-15 (×2): qty 1

## 2023-06-15 NOTE — Progress Notes (Signed)
Heart Failure Navigator Progress Note  Assessed for Heart & Vascular TOC clinic readiness.  Patient does not meet criteria due to Advanced Heart Failure Team patient of Dr. McLean.   Navigator will sign off at this time.   Mahreen Schewe, BSN, RN Heart Failure Nurse Navigator Secure Chat Only   

## 2023-06-15 NOTE — Progress Notes (Signed)
Occupational Therapy Treatment Patient Details Name: Luis [name] MRN: [id number] DOB: Jun 24, 1971 Today's Date: 06/15/2023   History of present illness Pt is a 52 y/o M presenting to ED from home with R sided weakness and decr snesation x5 days. Fell and hit head on dresser, blurred visioin. CTH negative. 12/16 MRI without acute  abnormalities PMH includes combined CHF, ICD placement, VT, HTN, OSA on CPAP, PTSD, chronic hypoxic respiratory failure on 2.5L, GERD, anxiety, polysubstance abuse, DM2, morbid obesity   OT comments  Pt progressing well towards goals, still with RUE limitations but much improved from evaluation. Pt completing standing shaving task at sink with RUE, able to sustain grasp on razor to shave face. Pt provided with squeeze ball and [facility] handout for RUE strengthening/ROM. Pt presenting with impairments listed below, will follow acutely. Patient will benefit from intensive inpatient follow up therapy, >3 hours/day to maximize safety/ind with ADL/functional mobility.       If plan is discharge home, recommend the following:  Two people to help with walking and/or transfers;A lot of help with bathing/dressing/bathroom;Assistance with cooking/housework;Direct supervision/assist for medications management;Direct supervision/assist for financial management;Assist for transportation   Equipment Recommendations  Other (comment) (defer)    Recommendations for Other Services PT consult;Rehab consult    Precautions / Restrictions Precautions Precautions: Fall Precaution Comments: 2.5L O2 baseline Restrictions Weight Bearing Restrictions Per Provider Order: No       Mobility Bed Mobility               General bed mobility comments: up in recliner on arrival    Transfers Overall transfer level: Needs assistance Equipment used: Rolling walker (2 wheels) Transfers: Sit to/from Stand, Bed to chair/wheelchair/BSC Sit to Stand: Contact guard assist                  Balance Overall balance assessment: Needs assistance Sitting-balance support: Feet supported Sitting balance-Leahy Scale: Good     Standing balance support: Bilateral upper extremity supported Standing balance-Leahy Scale: Poor Standing balance comment: reliant on external support                           ADL either performed or assessed with clinical judgement   ADL Overall ADL's : Needs assistance/impaired     Grooming: Set up Grooming Details (indicate cue type and reason): shaves sitting/standing at sink                 Toilet Transfer: Contact guard assist;Ambulation;Rolling walker (2 wheels)           Functional mobility during ADLs: Contact guard assist;Rolling walker (2 wheels)      Extremity/Trunk Assessment Upper Extremity Assessment RUE Deficits / Details: 4/5, decr FMC, decr sensation RUE Sensation: decreased light touch;decreased proprioception RUE Coordination: decreased fine motor;decreased gross motor            Vision   Vision Assessment?: No apparent visual deficits   Perception Perception Perception: Not tested   Praxis Praxis Praxis: Not tested    Cognition Arousal: Alert Behavior During Therapy: WFL for tasks assessed/performed Overall Cognitive Status: Within Functional Limits for tasks assessed                                 General Comments: oriented x4, aware of why he is at the hospital and events leading to admission. Recalls rehab admission for last stroke with good implementation  of techniques learned in rehab        Exercises Exercises: Other exercises Other Exercises Other Exercises: squeeze ball Other Exercises: [facility] handout    Shoulder Instructions       General Comments VSS on 2.5L O2    Pertinent Vitals/ Pain       Pain Assessment Pain Assessment: Faces Pain Score: 2  Faces Pain Scale: Hurts a little bit Pain Descriptors / Indicators: Grimacing, Heaviness Pain  Intervention(s): Limited activity within patient's tolerance, Monitored during session, Repositioned  Home Living                                          Prior Functioning/Environment              Frequency  Min 1X/week        Progress Toward Goals  OT Goals(current goals can now be found in the care plan section)  Progress towards OT goals: Progressing toward goals  Acute Rehab OT Goals Patient Stated Goal: to go to rehab OT Goal Formulation: With patient Time For Goal Achievement: 06/27/23 Potential to Achieve Goals: Good ADL Goals Pt Will Perform Upper Body Dressing: with contact guard assist Pt Will Perform Lower Body Dressing: with contact guard assist;sitting/lateral leans Pt Will Transfer to Toilet: ambulating;regular height toilet;with min assist Pt Will Perform Tub/Shower Transfer: Tub transfer;Shower transfer;with contact guard assist;ambulating;rolling walker Pt/caregiver will Perform Home Exercise Program: Right Upper extremity;Increased strength;Increased ROM;With written HEP provided;Independently  Plan      Co-evaluation                 AM-PAC OT "6 Clicks" Daily Activity     Outcome Measure   Help from another person eating meals?: A Little Help from another person taking care of personal grooming?: A Little Help from another person toileting, which includes using toliet, bedpan, or urinal?: A Little Help from another person bathing (including washing, rinsing, drying)?: A Lot Help from another person to put on and taking off regular upper body clothing?: A Little Help from another person to put on and taking off regular lower body clothing?: A Lot 6 Click Score: 16    End of Session Equipment Utilized During Treatment: Rolling walker (2 wheels);Oxygen;Gait belt  OT Visit Diagnosis: Unsteadiness on feet (R26.81);Other abnormalities of gait and mobility (R26.89);Muscle weakness (generalized) (M62.81);History of falling  (Z91.81);Other symptoms and signs involving the nervous system (R29.898)   Activity Tolerance Patient tolerated treatment well   Patient Left with call bell/phone within reach;in chair;with chair alarm set;with nursing/sitter in room   Nurse Communication Mobility status        Time: 4098-1191 OT Time Calculation (min): 23 min  Charges: OT General Charges $OT Visit: 1 Visit OT Treatments $Self Care/Home Management : 8-22 mins $Therapeutic Activity: 8-22 mins  Carver Fila, OTD, OTR/L SecureChat Preferred Acute Rehab [phone]   Luis [name] 06/15/2023, 3:01 PM

## 2023-06-15 NOTE — Plan of Care (Signed)
  Problem: Education: Goal: Ability to describe self-care measures that may prevent or decrease complications (Diabetes Survival Skills Education) will improve Outcome: Progressing   Problem: Coping: Goal: Ability to adjust to condition or change in health will improve Outcome: Progressing   Problem: Fluid Volume: Goal: Ability to maintain a balanced intake and output will improve Outcome: Progressing   Problem: Health Behavior/Discharge Planning: Goal: Ability to manage health-related needs will improve Outcome: Progressing

## 2023-06-15 NOTE — Progress Notes (Signed)
Inpatient Rehab Admissions Coordinator:    I met with Pt. And spoke with Pt.'s friend [name] over the phone. They are interested in CIR for Pt. And [name] can stay with Pt. And assist 24/7 but he needs a letter for his landlord stating that she can stay in his home. [facility] will [facility] once MD note is in.   Megan Salon, MS, CCC-SLP Rehab Admissions Coordinator  [phone] (celll) [phone] (office)

## 2023-06-15 NOTE — Plan of Care (Signed)
  Problem: Metabolic: Goal: Ability to maintain appropriate glucose levels will improve Outcome: Progressing   Problem: Nutritional: Goal: Maintenance of adequate nutrition will improve Outcome: Progressing   Problem: Skin Integrity: Goal: Risk for impaired skin integrity will decrease Outcome: Progressing   Problem: Activity: Goal: Risk for activity intolerance will decrease Outcome: Progressing   Problem: Nutrition: Goal: Adequate nutrition will be maintained Outcome: Progressing   Problem: Coping: Goal: Level of anxiety will decrease Outcome: Progressing   Problem: Pain Management: Goal: General experience of comfort will improve Outcome: Progressing

## 2023-06-15 NOTE — Procedures (Signed)
STROKE TEAM PROGRESS NOTE   BRIEF HPI [name] is a 52 y.o. male with history of Anxiety, Biventricular ICD (implantable cardioverter-defibrillator) in place, Chronic systolic CHF (congestive heart failure) (HCC), Cocaine use, Diabetes mellitus without complication (HCC), GERD (gastroesophageal reflux disease), HLD (hyperlipidemia), Hypertension, Morbid obesity (HCC), NICM (nonischemic cardiomyopathy) (HCC), OSA (obstructive sleep apnea), PTSD (post-traumatic stress disorder), Refusal of blood transfusions as patient is Jehovah's Witness, and Tobacco abuse., as well as stroke with mild right sided deficit who presented to the [facility] ED on Saturday afternoon with multiple complaints, including 4 to 5 days of left-sided headache with right-sided weakness, increased shortness of breath especially when laying flat, increased lower extremity edema, high blood pressures up to 200 systolic at home and elevated blood sugars has into the 4 and 500s despite reported compliance with medications. He also states that he had a fall that occurred approximately 4 days ago with mild head injury at the time. Does not endorse any vision loss, dysarthria, aphasia or confusion. Neurology was consulted for right sided weakness that was noted by both EDP and Hospitalist.   NIH on Admission 3  SIGNIFICANT HOSPITAL EVENTS   INTERIM HISTORY/SUBJECTIVE MRI brain shows no acute infarct.  Interestingly patient had similar presentation in April 2024 with negative MRI scan as well with clinical findings suggestive of possible conversion reaction.,  Still having right sided weakness but variable effort and diminished sensation.    OBJECTIVE  CBC    Component Value Date/Time   WBC 7.9 06/15/2023 0515   RBC 4.50 06/15/2023 0515   HGB 12.9 (L) 06/15/2023 0515   HCT 39.4 06/15/2023 0515   PLT 235 06/15/2023 0515   MCV 87.6 06/15/2023 0515   MCH 28.7 06/15/2023 0515   MCHC 32.7 06/15/2023 0515   RDW 13.0  06/15/2023 0515   LYMPHSABS 1.8 06/12/2023 1539   MONOABS 0.7 06/12/2023 1539   EOSABS 0.1 06/12/2023 1539   BASOSABS 0.0 06/12/2023 1539    BMET    Component Value Date/Time   NA 133 (L) 06/15/2023 0515   NA 142 07/13/2022 1436   K 3.6 06/15/2023 0515   CL 101 06/15/2023 0515   CO2 24 06/15/2023 0515   GLUCOSE 287 (H) 06/15/2023 0515   BUN 15 06/15/2023 0515   BUN 9 07/13/2022 1436   CREATININE 0.86 06/15/2023 0515   CALCIUM 8.7 (L) 06/15/2023 0515   EGFR 79 07/13/2022 1436   GFRNONAA >60 06/15/2023 0515    IMAGING past 24 hours VAS US CAROTID Result Date: 06/14/2023 Carotid Arterial Duplex Study Patient Name:  [name]  Date of Exam:   06/14/2023 Medical Rec #: [id number]              Accession #:    [id number] Date of Birth: 1971-05-10               Patient Gender: M Patient Age:   71 years Exam Location:  [facility] Procedure:      VAS US CAROTID Referring Phys: Elmer Picker --------------------------------------------------------------------------------  Indications:       CVA. Risk Factors:      Hypertension, hyperlipidemia, Diabetes, current smoker, prior                    CVA. Comparison Study:  None Performing Technologist: Shona Simpson  Examination Guidelines: A complete evaluation includes B-mode imaging, spectral Doppler, color Doppler, and power Doppler as needed of all accessible portions of each vessel. Bilateral testing is considered  an integral part of a complete examination. Limited examinations for reoccurring indications may be performed as noted.  Right Carotid Findings: +----------+--------+--------+--------+------------------+--------+           PSV cm/sEDV cm/sStenosisPlaque DescriptionComments +----------+--------+--------+--------+------------------+--------+ CCA Prox  106     18                                         +----------+--------+--------+--------+------------------+--------+ CCA Distal97      18                                          +----------+--------+--------+--------+------------------+--------+ ICA Prox  80      19      1-39%   hypoechoic                 +----------+--------+--------+--------+------------------+--------+ ICA Distal48      17                                         +----------+--------+--------+--------+------------------+--------+ ECA       112     16                                         +----------+--------+--------+--------+------------------+--------+ +----------+--------+-------+--------+-------------------+           PSV cm/sEDV cmsDescribeArm Pressure (mmHG) +----------+--------+-------+--------+-------------------+ [id number]     0                                  +----------+--------+-------+--------+-------------------+ +---------+--------+--+--------+--+ VertebralPSV cm/s46EDV cm/s13 +---------+--------+--+--------+--+  Left Carotid Findings: +----------+--------+--------+--------+------------------+--------+           PSV cm/sEDV cm/sStenosisPlaque DescriptionComments +----------+--------+--------+--------+------------------+--------+ CCA Prox  133     19                                         +----------+--------+--------+--------+------------------+--------+ CCA Distal113     22                                         +----------+--------+--------+--------+------------------+--------+ ICA Prox  94      34      1-39%   smooth                     +----------+--------+--------+--------+------------------+--------+ ICA Distal65      23                                         +----------+--------+--------+--------+------------------+--------+ ECA       109     15                                         +----------+--------+--------+--------+------------------+--------+ +----------+--------+--------+--------+-------------------+  PSV cm/sEDV cm/sDescribeArm Pressure (mmHG)  +----------+--------+--------+--------+-------------------+ Subclavian173     0                                   +----------+--------+--------+--------+-------------------+ +---------+--------+--+--------+--+ VertebralPSV cm/s43EDV cm/s13 +---------+--------+--+--------+--+   Summary: Right Carotid: Velocities in the right ICA are consistent with a 1-39% stenosis. Left Carotid: Velocities in the left ICA are consistent with a 1-39% stenosis. Vertebrals:  Bilateral vertebral arteries demonstrate antegrade flow. Subclavians: Normal flow hemodynamics were seen in bilateral subclavian              arteries. *See table(s) above for measurements and observations.  Electronically signed by Gerarda Fraction on 06/14/2023 at 6:51:19 PM.    Final    MR BRAIN WO CONTRAST Result Date: 06/14/2023 CLINICAL DATA:  Neuro deficit, acute, stroke suspected right sided weakness; Stroke, follow up EXAM: MRI HEAD WITHOUT CONTRAST MRA HEAD WITHOUT CONTRAST TECHNIQUE: Multiplanar, multi-echo pulse sequences of the brain and surrounding structures were acquired without intravenous contrast. Angiographic images of the Circle of Willis were acquired using MRA technique without intravenous contrast. COMPARISON:  Same-day head CT. CTA head and neck angiogram 10/25/2022 FINDINGS: MRI HEAD FINDINGS Brain: Negative for an acute infarct. No hemorrhage. No hydrocephalus. No extra-axial fluid collection. No mass lesion. No mass effect. Vascular: Normal flow voids. Skull and upper cervical spine: Normal marrow signal. Sinuses/Orbits: No middle ear or mastoid effusion. Paranasal sinuses are notable for mucosal thickening of the floor of bilateral maxillary sinuses. Orbits are unremarkable. Other: None. MRA HEAD FINDINGS Anterior circulation: No aneurysm. No vascular malformation. No proximal occlusion. There is moderate narrowing in the A1 segment of the right ACA Posterior circulation: No aneurysm. No proximal occlusion. No vascular  malformation. Anatomic variants: None IMPRESSION: 1. No acute intracranial abnormality. 2. No intracranial large vessel occlusion. Moderate narrowing in the A1 segment of the right ACA. Electronically Signed   By: Lorenza Cambridge M.D.   On: 06/14/2023 15:53   MR ANGIO HEAD WO CONTRAST Result Date: 06/14/2023 CLINICAL DATA:  Neuro deficit, acute, stroke suspected right sided weakness; Stroke, follow up EXAM: MRI HEAD WITHOUT CONTRAST MRA HEAD WITHOUT CONTRAST TECHNIQUE: Multiplanar, multi-echo pulse sequences of the brain and surrounding structures were acquired without intravenous contrast. Angiographic images of the Circle of Willis were acquired using MRA technique without intravenous contrast. COMPARISON:  Same-day head CT. CTA head and neck angiogram 10/25/2022 FINDINGS: MRI HEAD FINDINGS Brain: Negative for an acute infarct. No hemorrhage. No hydrocephalus. No extra-axial fluid collection. No mass lesion. No mass effect. Vascular: Normal flow voids. Skull and upper cervical spine: Normal marrow signal. Sinuses/Orbits: No middle ear or mastoid effusion. Paranasal sinuses are notable for mucosal thickening of the floor of bilateral maxillary sinuses. Orbits are unremarkable. Other: None. MRA HEAD FINDINGS Anterior circulation: No aneurysm. No vascular malformation. No proximal occlusion. There is moderate narrowing in the A1 segment of the right ACA Posterior circulation: No aneurysm. No proximal occlusion. No vascular malformation. Anatomic variants: None IMPRESSION: 1. No acute intracranial abnormality. 2. No intracranial large vessel occlusion. Moderate narrowing in the A1 segment of the right ACA. Electronically Signed   By: Lorenza Cambridge M.D.   On: 06/14/2023 15:53     Vitals:   06/14/23 1942 06/15/23 0004 06/15/23 0827 06/15/23 1118  BP: (!) 135/55 (!) 140/67 (!) 143/80 (!) 151/88  Pulse: 73 80 68 95  Resp: 18 18 18 18   Temp:  98.4 F (  36.9 C) 97.6 F (36.4 C) 98.3 F (36.8 C)  TempSrc: Oral  Oral Oral Oral  SpO2: 93% 97% 99% 95%  Weight:      Height:         PHYSICAL EXAM General:  Alert, mildly obese middle-aged African-American male Psych: tearful throughout exam, patient expresses anxiety regarding health  CV: Regular rate and rhythm on monitor Respiratory:  Regular, unlabored respirations, Peletier in place GI: Abdomen soft and nontender   NEURO:  Mental Status: AA&Ox3, patient is able to give clear and coherent history Speech/Language: speech is without dysarthria or aphasia.  Naming, repetition, fluency, and comprehension intact.  Cranial Nerves:  II: PERRL. Visual fields full.  III, IV, VI: EOMI. Eyelids elevate symmetrically.  V: Sensation is intact to light touch and symmetrical to face.  VII: Face is symmetrical resting and smiling VIII: hearing intact to voice. IX, X: Palate elevates symmetrically. Phonation is normal.  XB:MWUXLKGM shrug 5/5. XII: tongue is midline without fasciculations. Motor: LUE 5/5 proximally and distally RUE 4-/5 deltoid, 4/5 biceps and triceps, 4/5 grip..  Trace right upper extremity drift with poor and variable effort. LLE 5/5 proximally and distally RLE Unable to elevate thigh off bed in sitting position: HF 2/5. KE 3/5, KF 3/5, unable to dorsiflex or plantar flex foot.  Some giveway weakness suggesting a functional overlay to exam Sensory: Decreased temp and FT sensation to thigh and lower leg on the right.  Normal sensation to RUE shoulder, upper arm and forearm, but was apparently insensate to FT and deep pressure to the same limb.  LUE and LLE subjectively with normal temp and FT sensation.  Cerebellar: No ataxia with FNF bilaterally, but slower on the right. No action or rest tremor.   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   MRI no acute infarct Carotid Doppler bilateral 1-39% stenosis  2D Echo 25-30% EF with global hypokinesis. LDL 72 HgbA1c 12.0 VTE prophylaxis - lovenox clopidogrel 75 mg daily prior to admission, now on Brilinta 90 mg twice daily ASA allergy- anaphylaxis Therapy recommendations:  Pending Disposition:  pending completion of work up   Congestive heart failure  Last echocardiogram on 02/2022 with a EF of 25%, global hypokinesis of the left ventricle, indeterminate diastolic function.  No significant valvular abnormalities Updated echo pending  continue Bumex, digoxin, beta blocker s/p ICD  Chronic respiratory failure with hypoxia  2L [place]   Hyperlipidemia Home meds:  Crestor, resumed in hospital LDL 72, goal < 70 Continue statin at discharge  Diabetes type II Controlled Home meds:  insulin, farxiga HgbA1c 12.0, goal < 7.0 CBGs SSI Recommend close follow-up with PCP for better DM control  Other Stroke Risk Factors Obesity, Body mass index is 50.75 kg/m., BMI >/= 30 associated with increased stroke risk, recommend weight loss, diet and exercise as appropriate   Hospital day # 0  .  Patient has history of aspirin allergy and has been on Plavix.  Presents with right-sided weakness with nonorganic features and MRI shows no acute infarct.  Etiology probably conversion reaction as he had similar presentation in April 2024 with negative neuroimaging.  Continue  Plavix for stroke prevention if he can afford it.  Maintain aggressive risk factor modification.  Patient counseled to be compliant with using his CPAP for sleep  apnea.  Greater than 50% time during this 35-minute visit was spent in counseling and coordination of care about his stroke and discussion about stroke evaluation, prevention and treatment questions.  Discussed with Dr. Janee Morn.  Stroke team will sign off.  Kindly call for questions.  Delia Heady, MD Medical Director [facility] Pager:  [phone] 06/15/2023 1:59 PM   To contact Stroke Continuity provider, please refer to [email or web address]. After hours, contact General Neurology

## 2023-06-15 NOTE — Progress Notes (Addendum)
Physical Therapy Treatment Patient Details Name: Luis [name] MRN: [id number] DOB: May 15, 1971 Today's Date: 06/15/2023   History of Present Illness Pt is a 52 y/o M presenting to ED from home with R sided weakness and decr snesation x5 days. Fell and hit head on dresser, blurred visioin. CTH negative. 12/16 MRI without acute abnormalities. PMH includes combined CHF, ICD placement, VT, HTN, OSA on CPAP, PTSD, chronic hypoxic respiratory failure on 2.5L, GERD, anxiety, polysubstance abuse, DM2, morbid obesity    PT Comments  Pt greeted up in chair on arrival, eager for mobility with great progress towards acute goals. Pt requiring grossly CGA for transfers and gait with RW for support, chair follow provided as pt continues to fatigue quickly, needing seated rest breaks x2 during ambulation. Pt continues to have decreased R foot clearance during gait, often sliding foot along floor, pt able to clear foot minimally a few times with increased effort however unable to maintain for more than a few steps. Pt demonstrating good carryover for techniques and safety from previous rehab admission and current plan remains appropriate to address deficits and maximize functional independence. Pt continues to benefit from skilled PT services to progress toward functional mobility goals.     If plan is discharge home, recommend the following: A lot of help with walking and/or transfers;A little help with bathing/dressing/bathroom   Can travel by private vehicle        Equipment Recommendations  Other (comment) (TBA)    Recommendations for Other Services       Precautions / Restrictions Precautions Precautions: Fall Precaution Comments: 2.5L O2 baseline Restrictions Weight Bearing Restrictions Per Provider Order: No     Mobility  Bed Mobility Overal bed mobility: Needs Assistance             General bed mobility comments: up in recliner on arrival    Transfers Overall transfer level:  Needs assistance Equipment used: Rolling walker (2 wheels) Transfers: Sit to/from Stand, Bed to chair/wheelchair/BSC Sit to Stand: Contact guard assist           General transfer comment: CGA from recliner and standard chair in hall x3 during session    Ambulation/Gait Ambulation/Gait assistance: Contact guard assist (chair follow for safety) Gait Distance (Feet): 60 Feet (+ 60' + 120') Assistive device: Rolling walker (2 wheels) Gait Pattern/deviations: Step-to pattern, Decreased step length - right, Decreased dorsiflexion - right Gait velocity: decr     General Gait Details: steady gait with RW support, sliding RLE along floor initially, able to minimally clear with increased distance, x2 seated rest breaks due to fatigue   Stairs             Wheelchair Mobility     Tilt Bed    Modified Rankin (Stroke Patients Only) Modified Rankin (Stroke Patients Only) Pre-Morbid Rankin Score: Slight disability Modified Rankin: Moderately severe disability     Balance Overall balance assessment: Needs assistance Sitting-balance support: Feet supported Sitting balance-Leahy Scale: Good     Standing balance support: Bilateral upper extremity supported Standing balance-Leahy Scale: Poor Standing balance comment: reliant on external support                            Cognition Arousal: Alert Behavior During Therapy: WFL for tasks assessed/performed Overall Cognitive Status: Within Functional Limits for tasks assessed  General Comments: oriented x4, aware of why he is at the hospital and events leading to admission. Recalls rehab admission for last stroke with good implementation of techniques learned in rehab        Exercises      General Comments General comments (skin integrity, edema, etc.): VSS on supplemental O2      Pertinent Vitals/Pain Pain Assessment Pain Assessment: Faces Faces Pain Scale: Hurts  a little bit Pain Location: R toes ("feels like someone is standing on my toes") Pain Descriptors / Indicators: Grimacing, Heaviness Pain Intervention(s): Monitored during session, Limited activity within patient's tolerance    Home Living                          Prior Function            PT Goals (current goals can now be found in the care plan section) Acute Rehab PT Goals Patient Stated Goal: to get stronger and get home PT Goal Formulation: With patient Time For Goal Achievement: 06/27/23 Progress towards PT goals: Progressing toward goals    Frequency    Min 1X/week      PT Plan      Co-evaluation              AM-PAC PT "6 Clicks" Mobility   Outcome Measure  Help needed turning from your back to your side while in a flat bed without using bedrails?: A Little Help needed moving from lying on your back to sitting on the side of a flat bed without using bedrails?: A Little Help needed moving to and from a bed to a chair (including a wheelchair)?: A Little Help needed standing up from a chair using your arms (e.g., wheelchair or bedside chair)?: A Little Help needed to walk in hospital room?: A Little Help needed climbing 3-5 steps with a railing? : Total 6 Click Score: 16    End of Session   Activity Tolerance: Patient tolerated treatment well Patient left: in chair;with call bell/phone within reach;with chair alarm set Nurse Communication: Mobility status PT Visit Diagnosis: Unsteadiness on feet (R26.81);Other abnormalities of gait and mobility (R26.89);Difficulty in walking, not elsewhere classified (R26.2)     Time: 9937-1696 PT Time Calculation (min) (ACUTE ONLY): 20 min  Charges:    $Gait Training: 8-22 mins PT General Charges $$ ACUTE PT VISIT: 1 Visit                     Aleja Yearwood R. PTA Acute Rehabilitation Services Office: [phone]   Catalina Antigua 06/15/2023, 10:10 AM

## 2023-06-15 NOTE — TOC Initial Note (Signed)
Transition of Care [facility]) - Initial/Assessment Note    Patient Details  Name: Luis [name] MRN: [id number] Date of Birth: 1971/05/28  Transition of Care [facility]) CM/SW Contact:    Kermit Balo, RN Phone Number: 06/15/2023, 12:34 PM  Clinical Narrative:                  Pt is from home alone. He states he falls periodically at home and sometimes is in the floor for a couple days. He has a friend that assists him periodically. He states he needs a note to assist in her being able to stay over and assist him. He says his apartment won't let his friend stay over more than one night.   Pt is active with the [facility]. CM has reached out to them and updated them on his admission. CM inquired about assistance at home. He will qualify for caregivers at home. If pt d/c to CIR they will need to reach out to [place] SW through the [place] [phone] ext [phone] and she will get this arranged for home. If he doesn't d/c to CIR, CM will call and get this arranged prior to d/c.   Pt states he is waiting on an electrical scooter through the [place].   Pt states he lost his CPAP at [employer]. He is asking about getting a new one. Pt had a sleep study in 2022. CM called [facility] (pt uses them for home prn oxygen). They are going to look into getting him a new CPAP. They will contact [facility] if needed for more information.   Pt uses [employer] for transportation needs. He hopes to get a new vehicle to drive self.   PCP through the [place]: Dr Alfredia Client  Pt inquired about getting his medications pre-packaged (bubble packs). Pt states he uses the [facility] pharmacy for most of his medications. CM called [facility] pharmacy and they do not pre-package meds. Pt may have to change his pharmacy to a local one to have his meds pre-packaged.   CIR working up for rehab stay. He will need insurance auth through [place] for admission.  TOC following.      Expected  Discharge Plan: IP Rehab Facility Barriers to Discharge: Continued Medical Work up   Patient Goals and CMS Choice   CMS Medicare.gov Compare Post Acute Care list provided to:: Patient Choice offered to / list presented to : Patient      Expected Discharge Plan and Services   Discharge Planning Services: CM Consult Post Acute Care Choice: IP Rehab Living arrangements for the past 2 months: Apartment                                      Prior Living Arrangements/Services Living arrangements for the past 2 months: Apartment Lives with:: Self Patient language and need for interpreter reviewed:: Yes Do you feel safe going back to the place where you live?: Yes        Care giver support system in place?: Yes (comment) Current home services: DME (rollator/ shower seat) Criminal Activity/Legal Involvement Pertinent to Current Situation/Hospitalization: No - Comment as needed  Activities of Daily Living   ADL Screening (condition at time of admission) Independently performs ADLs?: Yes (appropriate for developmental age) Is the patient deaf or have difficulty hearing?: No Does the patient have difficulty seeing, even when wearing glasses/contacts?: Yes Does the patient have  difficulty concentrating, remembering, or making decisions?: No  Permission Sought/Granted                  Emotional Assessment Appearance:: Appears stated age Attitude/Demeanor/Rapport: Engaged Affect (typically observed): Accepting Orientation: : Oriented to Self, Oriented to Place, Oriented to  Time, Oriented to Situation   Psych Involvement: No (comment)  Admission diagnosis:  Weakness [R53.1] Right sided weakness [R53.1] Patient Active Problem List   Diagnosis Date Noted   History of stroke 12/05/2022   MDD (major depressive disorder), 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/04/2022   Cocaine use 12/04/2022   Weakness 10/25/2022   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   Acute on  chronic systolic heart failure, NYHA class 3 (HCC) 04/16/2022   Fall 03/24/2022   Biventricular ICD (implantable cardioverter-defibrillator) in place    Lightheadedness    Acute on chronic combined systolic and diastolic CHF (congestive heart failure) (HCC) 02/26/2022   Cocaine abuse (HCC) 02/26/2022   Chronic respiratory failure with hypoxia (HCC) - 2.5 L/min 02/26/2022   Sleep apnea 08/07/2021   MDD (major depressive disorder), recurrent severe, 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   Moderate episode of recurrent major depressive disorder (HCC) 05/08/2021   Generalized anxiety disorder 05/08/2021   Unspecified mood (affective) disorder (HCC) 05/08/2021   Insomnia 05/08/2021   Cocaine use disorder, moderate, dependence (HCC) 04/02/2021   Right arm weakness 01/28/2021   Right sided weakness 01/28/2021   Non-ischemic cardiomyopathy (HCC)    Morbid obesity (HCC) 05/05/2019   Chronic systolic CHF (congestive heart failure) (HCC) 01/17/2019   HLD (hyperlipidemia)    Diabetes (HCC)    Hypertension    GERD (gastroesophageal reflux disease)    Tobacco abuse    PCP:  Clinic, [facility] Pharmacy:   [id number] - [place],  - [address] AT [facility] [address] & [facility] [address] [place] [address] Phone: [phone] Fax: [phone]     Social Drivers of Health (SDOH) Social History: SDOH Screenings   Food Insecurity: No Food Insecurity (06/13/2023)  Housing: Low Risk  (06/13/2023)  Transportation Needs: Unmet Transportation Needs (06/13/2023)  Utilities: Not At Risk (06/13/2023)  Alcohol Screen: Low Risk  (12/04/2022)  Depression (PHQ2-9): Medium Risk (11/16/2022)  Financial Resource Strain: High Risk (11/16/2022)  Social Connections: Unknown (08/26/2022)   Received from [facility]  Stress: No Stress Concern Present (04/25/2023)   Received from [facility]  Tobacco Use: High Risk (06/12/2023)   SDOH Interventions:      Readmission Risk Interventions    01/31/2021   12:26 PM 01/31/2021    9:25 AM  Readmission Risk Prevention Plan  Transportation Screening Complete   PCP or Specialist Appt within 3-5 Days Complete Complete  HRI or Home Care Consult Complete Complete  Social Work Consult for Recovery Care Planning/Counseling Patient refused Complete  Palliative Care Screening Not Applicable Not Applicable  Medication Review Oceanographer) Complete Referral to Pharmacy

## 2023-06-15 NOTE — Progress Notes (Signed)
PROGRESS NOTE    Luis [name]  UEA:[id number] DOB: [birth year] DOA: 06/12/2023 PCP: [facility]   Chief Complaint  Patient presents with   Weakness    Brief Narrative:  52 y.o. male with medical history significant of Combined CHF, ICD placement, VT, HTN, OSA on CPAP, chronic hypoxic respiratory failure on 2L, GERD, Anxiety, polysubstance abuse (tobacco, cocaine), T2DM, Morbid obesity who presents with right sided weakness.    Reports a left sided occipital/parietal headache for 3 days and then in past 2 days developed right sided upper and lower extremity weakness. Then today fell because his right leg gave out on him causing him to fall and hit his right forehead on a dresser. Says blood pressure has been high in the SBP 200s and has noted his glucose up to 500s. Also notes numbness to his feet. Has been nauseous but no vomiting. Has low appetite. Reports compliance with his Plavix.    He has a similar episode of right sided weakness in April and had negative CT head x2, MRA head and brain. Only thing positive for UDS for cocaine. There was documented concern for behavioral component conversion disorder to his exam as right sided weakness was not consistent with every exam. However due to his risk factors he was placed on Plavix.      Assessment & Plan:   Principal Problem:   Right sided weakness Active Problems:   HLD (hyperlipidemia)   Diabetes (HCC)   Hypertension   Morbid obesity (HCC)   Sleep apnea   Chronic respiratory failure with hypoxia (HCC) - 2.5 L/min   Biventricular ICD (implantable cardioverter-defibrillator) in place   Combined systolic and diastolic heart failure (HCC)   Conversion reaction   #1 right-sided weakness/conversion reaction -Patient presented with a 3-day history of right-sided weakness prior preceded by left occipital headache. -Concern for acute CVA. 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 -CT head done negative for any acute abnormalities. -Carotid Dopplers done with no significant ICA stenosis. -2D echo done with a EF of 25 to 30%, left ventricular global hypokinesis, grade 1 diastolic dysfunction, mildly reduced right ventricular systolic function.  No atrial level shunt detected by color-flow Doppler. -Patient noted with ICD and as such MRI brain and MRA head obtained on 06/14/2023 with no acute intracranial abnormality, no intracranial large vessel occlusion, moderate narrowing in the A1 segment of the right ACA. -Neurology consulted and patient changed from clopidogrel to Brilinta 90 mg twice daily per neurology recommendations while MRI was pending. -Per neurology likely conversion reaction and recommending continuation of Plavix for stroke prophylaxis. -Will discontinue Brilinta and place back on Plavix 75 mg daily. -PT OT has assessed patient recommending inpatient rehab when bed available.  2.  Chronic respiratory failure with hypoxia on 2.5 L/min -Stable.    3 sleep apnea -CPAP nightly.  4.  Morbid obesity -BMI 50.75 kg/m -Lifestyle modification. -Outpatient follow-up with PCP.  5.  Poorly controlled diabetes mellitus -Hemoglobin A1c 12 (06/13/2023) -CBG noted at 277 this morning.  -Increase Semglee to 40 units daily, increase meal coverage NovoLog to 10 units 3 times daily with meals.  -SSI.  -Resume home regimen Farxiga. -Diabetes coordinator following who notes that patient takes Lantus 70 units nightly at home and NovoLog 25 units 3 times daily with meal coverage as well as [place]  10 mg daily and Trulicity 1.5 mg weekly.  6.  Hyperlipidemia -Crestor.   7.  Chronic combined systolic and diastolic CHF/status post ICD -Repeat 2D echo with ith a EF of 25 to 30%, left ventricular global hypokinesis, grade 1 diastolic  dysfunction, mildly reduced right ventricular systolic function.  No atrial level shunt detected by color-flow Doppler. -Continue home regimen Bumex, digoxin, Coreg, spironolactone, Crestor,    DVT prophylaxis: Lovenox Code Status: Full Family Communication: Updated patient.  No family at bedside. Disposition: Medically stable for discharge as of 06/15/2023.  To CIR when bed available.  Status is: Observation    Consultants:  Neurology: Dr. Otelia Limes 06/13/2023  Procedures:  2D echo 08/14/2022 Carotid Dopplers 06/14/2023 CT head 06/12/2023 MRI brain 06/14/2023 MRA angiogram head 06/14/2023  Antimicrobials:  Anti-infectives (From admission, onward)    Start     Dose/Rate Route Frequency Ordered Stop   06/15/23 1300  isoniazid (NYDRAZID) tablet 300 mg        300 mg Oral Daily 06/15/23 1206           Subjective: Patient sitting up in recliner.  Denies any chest pain or shortness of breath.  No abdominal pain.  Still with some complaints of some right-sided weakness however slightly improved.  Some improvement with headache however still with left occipital headache.  Tolerating oral intake.   Objective: Vitals:   06/15/23 0004 06/15/23 0827 06/15/23 1118 06/15/23 1509  BP: (!) 140/67 (!) 143/80 (!) 151/88 (!) 146/84  Pulse: 80 68 95 98  Resp: 18 18 18 18   Temp: 98.4 F (36.9 C) 97.6 F (36.4 C) 98.3 F (36.8 C) 98.8 F (37.1 C)  TempSrc: Oral Oral Oral Oral  SpO2: 97% 99% 95% 98%  Weight:      Height:        Intake/Output Summary (Last 24 hours) at 06/15/2023 1514 Last data filed at 06/15/2023 1300 Gross per 24 hour  Intake 740 ml  Output 2200 ml  Net -1460 ml   Filed Weights   06/12/23 1529  Weight: (!) 138.3 kg    Examination:  General exam: NAD. Respiratory system: Lungs clear to auscultation bilaterally.  No wheezes, no crackles, no rhonchi.  Fair air movement.  Speaking in full sentences.  Normal respiratory effort.   Cardiovascular system: Regular  rate rhythm no murmurs rubs or gallops.  No JVD.  No pitting lower extremity edema.  Gastrointestinal system: Abdomen is soft, nontender, nondistended, positive bowel sounds.  No rebound.  No guarding.  Central nervous system: Alert and oriented.  Cranial nerves II through XII grossly intact.  Gait not tested secondary to safety.  Right-sided weakness.  Extremities: Right-sided weakness.  Skin: No rashes, lesions or ulcers Psychiatry: Judgement and insight appear normal. Mood & affect appropriate.     Data Reviewed: I have personally reviewed following labs and imaging studies  CBC: Recent Labs  Lab 06/12/23 1539 06/14/23 0813 06/15/23 0515  WBC 7.5 7.4 7.9  NEUTROABS 5.0  --   --   HGB 13.6 13.7 12.9*  HCT 41.9 42.2 39.4  MCV 88.0 87.9 87.6  PLT 237 245 235    Basic Metabolic Panel: Recent Labs  Lab 06/12/23 1539 06/14/23 0813 06/15/23 0515  NA 137 135 133*  K 4.5 4.2 3.6  CL 99 99 101  CO2 24 26 24   GLUCOSE 421* 337* 287*  BUN 10 11 15   CREATININE 0.86 0.95 0.86  CALCIUM 9.5 8.8* 8.7*    GFR: Estimated Creatinine Clearance: 128.1  mL/min (by C-G formula based on SCr of 0.86 mg/dL).  Liver Function Tests: Recent Labs  Lab 06/12/23 1539 06/14/23 0813  AST 19 20  ALT 18 19  ALKPHOS 67 48  BILITOT 0.5 0.5  PROT 6.4* 6.1*  ALBUMIN 3.2* 3.1*    CBG: Recent Labs  Lab 06/14/23 1135 06/14/23 1627 06/14/23 2126 06/15/23 0718 06/15/23 1125  GLUCAP 365* 313* 315* 277* 360*     No results found for this or any previous visit (from the past 240 hours).       Radiology Studies: VAS US CAROTID Result Date: 06/14/2023 Carotid Arterial Duplex Study Patient Name:  DERWOOD PANG  Date of Exam:   06/14/2023 Medical Rec #: [id number]              Accession #:    [id number] Date of Birth: December 24, 1970               Patient Gender: M Patient Age:   73 years Exam Location:  [facility] Procedure:      VAS US CAROTID Referring Phys: Elmer Picker  --------------------------------------------------------------------------------  Indications:       CVA. Risk Factors:      Hypertension, hyperlipidemia, Diabetes, current smoker, prior                    CVA. Comparison Study:  None Performing Technologist: Shona Simpson  Examination Guidelines: A complete evaluation includes B-mode imaging, spectral Doppler, color Doppler, and power Doppler as needed of all accessible portions of each vessel. Bilateral testing is considered an integral part of a complete examination. Limited examinations for reoccurring indications may be performed as noted.  Right Carotid Findings: +----------+--------+--------+--------+------------------+--------+           PSV cm/sEDV cm/sStenosisPlaque DescriptionComments +----------+--------+--------+--------+------------------+--------+ CCA Prox  106     18                                         +----------+--------+--------+--------+------------------+--------+ CCA Distal97      18                                         +----------+--------+--------+--------+------------------+--------+ ICA Prox  80      19      1-39%   hypoechoic                 +----------+--------+--------+--------+------------------+--------+ ICA Distal48      17                                         +----------+--------+--------+--------+------------------+--------+ ECA       112     16                                         +----------+--------+--------+--------+------------------+--------+ +----------+--------+-------+--------+-------------------+           PSV cm/sEDV cmsDescribeArm Pressure (mmHG) +----------+--------+-------+--------+-------------------+ Subclavian158     0                                  +----------+--------+-------+--------+-------------------+ +---------+--------+--+--------+--+  VertebralPSV cm/s46EDV cm/s13 +---------+--------+--+--------+--+  Left Carotid Findings:  +----------+--------+--------+--------+------------------+--------+           PSV cm/sEDV cm/sStenosisPlaque DescriptionComments +----------+--------+--------+--------+------------------+--------+ CCA Prox  133     19                                         +----------+--------+--------+--------+------------------+--------+ CCA Distal113     22                                         +----------+--------+--------+--------+------------------+--------+ ICA Prox  94      34      1-39%   smooth                     +----------+--------+--------+--------+------------------+--------+ ICA Distal65      23                                         +----------+--------+--------+--------+------------------+--------+ ECA       109     15                                         +----------+--------+--------+--------+------------------+--------+ +----------+--------+--------+--------+-------------------+           PSV cm/sEDV cm/sDescribeArm Pressure (mmHG) +----------+--------+--------+--------+-------------------+ Subclavian173     0                                   +----------+--------+--------+--------+-------------------+ +---------+--------+--+--------+--+ VertebralPSV cm/s43EDV cm/s13 +---------+--------+--+--------+--+   Summary: Right Carotid: Velocities in the right ICA are consistent with a 1-39% stenosis. Left Carotid: Velocities in the left ICA are consistent with a 1-39% stenosis. Vertebrals:  Bilateral vertebral arteries demonstrate antegrade flow. Subclavians: Normal flow hemodynamics were seen in bilateral subclavian              arteries. *See table(s) above for measurements and observations.  Electronically signed by Gerarda Fraction on 06/14/2023 at 6:51:19 PM.    Final    MR BRAIN WO CONTRAST Result Date: 06/14/2023 CLINICAL DATA:  Neuro deficit, acute, stroke suspected right sided weakness; Stroke, follow up EXAM: MRI HEAD WITHOUT CONTRAST MRA HEAD  WITHOUT CONTRAST TECHNIQUE: Multiplanar, multi-echo pulse sequences of the brain and surrounding structures were acquired without intravenous contrast. Angiographic images of the Circle of Willis were acquired using MRA technique without intravenous contrast. COMPARISON:  Same-day head CT. CTA head and neck angiogram 10/25/2022 FINDINGS: MRI HEAD FINDINGS Brain: Negative for an acute infarct. No hemorrhage. No hydrocephalus. No extra-axial fluid collection. No mass lesion. No mass effect. Vascular: Normal flow voids. Skull and upper cervical spine: Normal marrow signal. Sinuses/Orbits: No middle ear or mastoid effusion. Paranasal sinuses are notable for mucosal thickening of the floor of bilateral maxillary sinuses. Orbits are unremarkable. Other: None. MRA HEAD FINDINGS Anterior circulation: No aneurysm. No vascular malformation. No proximal occlusion. There is moderate narrowing in the A1 segment of the right ACA Posterior circulation: No aneurysm. No proximal occlusion. No vascular malformation. Anatomic variants: None IMPRESSION: 1. No acute intracranial abnormality. 2. No intracranial large vessel occlusion. Moderate  narrowing in the A1 segment of the right ACA. Electronically Signed   By: Lorenza Cambridge M.D.   On: 06/14/2023 15:53   MR ANGIO HEAD WO CONTRAST Result Date: 06/14/2023 CLINICAL DATA:  Neuro deficit, acute, stroke suspected right sided weakness; Stroke, follow up EXAM: MRI HEAD WITHOUT CONTRAST MRA HEAD WITHOUT CONTRAST TECHNIQUE: Multiplanar, multi-echo pulse sequences of the brain and surrounding structures were acquired without intravenous contrast. Angiographic images of the Circle of Willis were acquired using MRA technique without intravenous contrast. COMPARISON:  Same-day head CT. CTA head and neck angiogram 10/25/2022 FINDINGS: MRI HEAD FINDINGS Brain: Negative for an acute infarct. No hemorrhage. No hydrocephalus. No extra-axial fluid collection. No mass lesion. No mass effect.  Vascular: Normal flow voids. Skull and upper cervical spine: Normal marrow signal. Sinuses/Orbits: No middle ear or mastoid effusion. Paranasal sinuses are notable for mucosal thickening of the floor of bilateral maxillary sinuses. Orbits are unremarkable. Other: None. MRA HEAD FINDINGS Anterior circulation: No aneurysm. No vascular malformation. No proximal occlusion. There is moderate narrowing in the A1 segment of the right ACA Posterior circulation: No aneurysm. No proximal occlusion. No vascular malformation. Anatomic variants: None IMPRESSION: 1. No acute intracranial abnormality. 2. No intracranial large vessel occlusion. Moderate narrowing in the A1 segment of the right ACA. Electronically Signed   By: Lorenza Cambridge M.D.   On: 06/14/2023 15:53   ECHOCARDIOGRAM COMPLETE Result Date: 06/14/2023    ECHOCARDIOGRAM REPORT   Patient Name:   SADIK HASSETT Date of Exam: 06/14/2023 Medical Rec #:  [id number]             Height:       65.0 in Accession #:    [id number]            Weight:       305.0 lb Date of Birth:  02/14/1971              BSA:          2.367 m Patient Age:    52 years              BP:           140/78 mmHg Patient Gender: M                     HR:           87 bpm. Exam Location:  Inpatient Procedure: 2D Echo, Cardiac Doppler and Color Doppler Indications:    stroke  History:        Patient has prior history of Echocardiogram examinations, most                 recent 02/27/2022. CHF, Defibrillator and Pacemaker; Risk                 Factors:Dyslipidemia, Hypertension and Diabetes.  Sonographer:    Webb Laws Referring Phys: [id number] PRAMOD S SETHI IMPRESSIONS  1. Left ventricular ejection fraction, by estimation, is 25 to 30%. The left ventricle has normal function. The left ventricle demonstrates global hypokinesis. Left ventricular diastolic parameters are consistent with Grade I diastolic dysfunction (impaired relaxation).  2. Right ventricular systolic function is mildly reduced. The  right ventricular size is normal. Tricuspid regurgitation signal is inadequate for assessing PA pressure.  3. The mitral valve is normal in structure. No evidence of mitral valve regurgitation.  4. The aortic valve is tricuspid. Aortic valve regurgitation is not visualized. No aortic stenosis is present.  5. The  inferior vena cava is normal in size with greater than 50% respiratory variability, suggesting right atrial pressure of 3 mmHg. FINDINGS  Left Ventricle: Left ventricular ejection fraction, by estimation, is 25 to 30%. The left ventricle has normal function. The left ventricle demonstrates global hypokinesis. Definity contrast agent was given IV to delineate the left ventricular endocardial borders. The left ventricular internal cavity size was normal in size. There is no left ventricular hypertrophy. Left ventricular diastolic parameters are consistent with Grade I diastolic dysfunction (impaired relaxation). Right Ventricle: The right ventricular size is normal. No increase in right ventricular wall thickness. Right ventricular systolic function is mildly reduced. Tricuspid regurgitation signal is inadequate for assessing PA pressure. Left Atrium: Left atrial size was normal in size. Right Atrium: Right atrial size was normal in size. Pericardium: There is no evidence of pericardial effusion. Mitral Valve: The mitral valve is normal in structure. No evidence of mitral valve regurgitation. Tricuspid Valve: The tricuspid valve is normal in structure. Tricuspid valve regurgitation is not demonstrated. Aortic Valve: The aortic valve is tricuspid. Aortic valve regurgitation is not visualized. No aortic stenosis is present. Aortic valve mean gradient measures 2.0 mmHg. Aortic valve peak gradient measures 3.7 mmHg. Pulmonic Valve: The pulmonic valve was normal in structure. Pulmonic valve regurgitation is not visualized. Aorta: The aortic root is normal in size and structure. Venous: The inferior vena cava is  normal in size with greater than 50% respiratory variability, suggesting right atrial pressure of 3 mmHg. IAS/Shunts: No atrial level shunt detected by color flow Doppler. Additional Comments: A device lead is visualized in the right ventricle.  LEFT VENTRICLE PLAX 2D LVIDd:         5.00 cm      Diastology LVIDs:         3.90 cm      LV e' medial:   6.53 cm/s LV PW:         1.10 cm      LV E/e' medial: 12.8 LV IVS:        0.90 cm  LV Volumes (MOD) LV vol d, MOD A2C: 148.0 ml LV vol d, MOD A4C: 203.0 ml LV vol s, MOD A2C: 54.6 ml LV vol s, MOD A4C: 87.2 ml LV SV MOD A2C:     93.4 ml LV SV MOD A4C:     203.0 ml LV SV MOD BP:      105.5 ml RIGHT VENTRICLE RV Basal diam:  3.70 cm RV S prime:     13.70 cm/s TAPSE (M-mode): 1.8 cm LEFT ATRIUM             Index        RIGHT ATRIUM          Index LA diam:        4.00 cm 1.69 cm/m   RA Area:     9.39 cm LA Vol (A2C):   37.7 ml 15.93 ml/m  RA Volume:   15.90 ml 6.72 ml/m LA Vol (A4C):   20.7 ml 8.75 ml/m LA Biplane Vol: 27.9 ml 11.79 ml/m  AORTIC VALVE AV Vmax:      96.20 cm/s AV Vmean:     66.900 cm/s AV VTI:       0.159 m AV Peak Grad: 3.7 mmHg AV Mean Grad: 2.0 mmHg  AORTA Ao Root diam: 2.70 cm Ao Asc diam:  3.20 cm MITRAL VALVE MV Area (PHT): 5.13 cm MV Decel Time: 148 msec MV E velocity: 83.80 cm/s MV A  velocity: 99.00 cm/s MV E/A ratio:  0.85 Dalton McleanMD Electronically signed by Wilfred Lacy Signature Date/Time: 06/14/2023/12:22:35 PM    Final         Scheduled Meds:   stroke: early stages of recovery book   Does not apply Once   bumetanide  2 mg Oral BID   carvedilol  3.125 mg Oral BID WC   clopidogrel  75 mg Oral Daily   digoxin  0.125 mg Oral Daily   enoxaparin (LOVENOX) injection  40 mg Subcutaneous Q24H   insulin aspart  0-20 Units Subcutaneous TID PC & HS   insulin aspart  10 Units Subcutaneous TID WC   insulin glargine-yfgn  40 Units Subcutaneous QHS   isoniazid  300 mg Oral Daily   nystatin cream   Topical BID   pantoprazole  40  mg Oral Daily   potassium chloride SA  20 mEq Oral Daily   prazosin  2 mg Oral QHS   pyridOXINE  100 mg Oral Daily   rosuvastatin  40 mg Oral Daily   spironolactone  25 mg Oral Daily   Continuous Infusions:   LOS: 0 days    Time spent: 35 minutes    Ramiro Harvest, MD [facility]   To contact the attending provider between 7A-7P or the covering provider during after hours 7P-7A, please log into the web site [email or web address] and access using universal [place] password for that web site. If you do not have the password, please call the hospital operator.  06/15/2023, 3:14 PM

## 2023-06-15 NOTE — Inpatient Diabetes Management (Addendum)
Inpatient Diabetes Program Recommendations  AACE/ADA: New Consensus Statement on Inpatient Glycemic Control (2015)  Target Ranges:  Prepandial:   less than 140 mg/dL      Peak postprandial:   less than 180 mg/dL (1-2 hours)      Critically ill patients:  140 - 180 mg/dL   Lab Results  Component Value Date   GLUCAP 277 (H) 06/15/2023   HGBA1C 12.0 (H) 06/13/2023    Latest Reference Range & Units 06/14/23 08:35 06/14/23 11:35 06/14/23 16:27 06/14/23 21:26 06/15/23 07:18  Glucose-Capillary 70 - 99 mg/dL 782 (H) 956 (H) 213 (H) 315 (H) 277 (H)  (H): Data is abnormally high  Review of Glycemic Control  Diabetes history: DM2 Outpatient Diabetes medications: Lantus 70 units nightly Novolog 25 units tid meal coverage Farxiga 10 mg daily Trulicity 1.5 mg weekly (off x 3 months due to being on back order) Current orders for Inpatient glycemic control: Semglee 34 units nightly, Novolog 8 units tid meal coverage, Novolog 0-20 units qid correction  Inpatient Diabetes Program Recommendations:   Spoke with patient regarding insulin doses @ home. Patient states he currently takes Glargine 70 units nightly and Novolog 25 units tid meal coverage. Updated Dr. Janee Morn and requested increase in Semglee to 40 and Novolog meal coverage to 10 units tid if eats 50% meals.  Discussed A1c of 12.0 (average blood glucose of 298 over the past 2-3 months). Patient states he has been off of his Trulicity due to the medication being on back order. Patient is changing physicians and had an appointment @ the [place] with a new physician today but had to cancel due to being in the hospital. Patient plans to discuss with his physician prescription for Trulicity along with Dexcom G7. Will take patient G7 CGM samples for home use.  11:20 am MD ordered application of Dexcom G7 CGM at discharge for patient. Applied to left upper arm @ 11:10 am. Education done regarding application and changing CGM sensor (alternate every 10  days on back of arms), 24 minute warm-up, use of glucometer when alert displays. Patient has also been given educational packet regarding use CGM sensor including the 1-800 toll free number for any questions, problems or needs.  Sensor applied by patient to (L) Arm at .  Explained that glucose readings will not be available until 1 hour after application. Patient very appreciative.   Thank you, Luis [name]. Luis Elie, RN, MSN, CDCES  Diabetes Coordinator Inpatient Glycemic Control Team Team Pager [phone] (8am-5pm) 06/15/2023 8:23 AM

## 2023-06-15 NOTE — Progress Notes (Signed)
Patient refused vitals.

## 2023-06-15 NOTE — Consult Note (Signed)
Physical Medicine and Rehabilitation Consult Reason for Consult:right sided weakness and sensory loss Referring Physician: Janee Morn   HPI: Luis [name] is a 52 y.o. male with a history of anxiety, csCHF, cocaine use, DM, OSA/CPAP, prior left CVA who presented on 12/14 with a 5 day history of right sided weakness and sensory loss. BP elevated upon arrival to 200+ systolic. He reported having had a fall 4 days prior with "mild" head injury. CT of head was negative for acute disease. MRI also without acute findings. Neurology feels pt suffered a left sided subcortical infarct. Recommended changing plavix to brillinta for stroke prevention. Pt is still complaining of sensory loss along the right side and substantial Right lower extremity hemiparesis. He was up with PT today and was contact guard assist with sit-std transfers and walked 60' using RW also at contact guard assist level. Pt slid right foot along floor to help with swing as toe clearance was difficult. He also fatigued easily.    Review of Systems  Constitutional:  Positive for malaise/fatigue. Negative for fever.  HENT: Negative.    Eyes:  Positive for blurred vision.  Respiratory: Negative.  Negative for cough.   Cardiovascular: Negative.   Gastrointestinal:  Negative for heartburn, nausea and vomiting.  Genitourinary: Negative.   Musculoskeletal:  Positive for joint pain.  Neurological:  Positive for dizziness, sensory change, focal weakness, weakness and headaches.  Psychiatric/Behavioral:  Negative for depression.    Past Medical History:  Diagnosis Date   Anxiety    Biventricular ICD (implantable cardioverter-defibrillator) in place    Chronic systolic CHF (congestive heart failure) (HCC)    Cocaine use    Diabetes mellitus without complication (HCC)    GERD (gastroesophageal reflux disease)    HLD (hyperlipidemia)    Hypertension    Morbid obesity (HCC)    NICM (nonischemic cardiomyopathy) (HCC)     OSA (obstructive sleep apnea)    PTSD (post-traumatic stress disorder)    on Depakote   Refusal of blood transfusions as patient is Jehovah's Witness    Tobacco abuse    Past Surgical History:  Procedure Laterality Date   BIV ICD INSERTION CRT-D     FOOT FRACTURE SURGERY     ICD GENERATOR CHANGEOUT N/A 03/23/2022   Procedure: ICD GENERATOR CHANGEOUT;  Surgeon: Nelly Laurence, Roberts Gaudy, MD;  Location: [facility] INVASIVE CV LAB;  Service: Cardiovascular;  Laterality: N/A;   RIGHT HEART CATH N/A 03/06/2022   Procedure: RIGHT HEART CATH;  Surgeon: Orbie Pyo, MD;  Location: [facility] INVASIVE CV LAB;  Service: Cardiovascular;  Laterality: N/A;   RIGHT/LEFT HEART CATH AND CORONARY ANGIOGRAPHY N/A 04/16/2022   Procedure: RIGHT/LEFT HEART CATH AND CORONARY ANGIOGRAPHY;  Surgeon: Laurey Morale, MD;  Location: [facility] INVASIVE CV LAB;  Service: Cardiovascular;  Laterality: N/A;   ROTATOR CUFF REPAIR     TONSILLECTOMY     Family History  Problem Relation Age of Onset   Hypertension Mother    Bone cancer Father    Lung cancer Father    Social History:  reports that he has been smoking cigarettes. He started smoking about 16 years ago. He has a 7.5 pack-year smoking history. He has never used smokeless tobacco. He reports that he does not currently use alcohol. He reports current drug use. Drugs: Cocaine and Marijuana. Allergies:  Allergies  Allergen Reactions   Aspirin Anaphylaxis and Other (See Comments)    Throat closing    Iodine-131 Hives   Peanut (Diagnostic) Anaphylaxis  Throat closes   Iodinated Contrast Media Hives    Can be premedicated    Latex Hives and Rash   Penicillins Nausea And Vomiting   Ultram [Tramadol] Hives and Nausea Only   Zestril [Lisinopril] Cough   Cinnamon    Amoxicillin-Pot Clavulanate Diarrhea   Lidocaine Rash    Rash from adhesive on lidocaine patches    Medications Prior to Admission  Medication Sig Dispense Refill   acetaminophen (TYLENOL) 325 MG tablet Take 2  tablets (650 mg total) by mouth every 6 (six) hours as needed for mild pain.     bumetanide (BUMEX) 2 MG tablet Take 2 mg by mouth 2 (two) times daily.     carvedilol (COREG) 3.125 MG tablet Take 3.125 mg by mouth 2 (two) times daily with a meal.     clopidogrel (PLAVIX) 75 MG tablet Take 1 tablet (75 mg total) by mouth daily. 30 tablet 0   dapagliflozin propanediol (FARXIGA) 10 MG TABS tablet Take 1 tablet (10 mg total) by mouth daily. 90 tablet 2   digoxin (LANOXIN) 0.125 MG tablet Take 0.125 mg by mouth daily.     hydrOXYzine (ATARAX) 25 MG tablet Take 1 tablet (25 mg total) by mouth 3 (three) times daily as needed for anxiety. 75 tablet 1   insulin aspart protamine- aspart (NOVOLOG MIX 70/30) (70-30) 100 UNIT/ML injection Inject 0.7 mLs (70 Units total) into the skin 2 (two) times daily with breakfast and lunch. (Patient taking differently: Inject 70 Units into the skin 2 (two) times daily with a meal.) 42 mL 0   insulin glargine (LANTUS) 100 UNIT/ML injection Inject 25 Units into the skin at bedtime.     insulin glargine-yfgn (SEMGLEE) 100 UNIT/ML injection Inject 0.2 mLs (20 Units total) into the skin at bedtime. 10 mL 11   isoniazid (NYDRAZID) 300 MG tablet Take 1 tablet by mouth daily.     pantoprazole (PROTONIX) 40 MG tablet Take 40 mg by mouth daily.     potassium chloride SA (KLOR-CON M) 20 MEQ tablet Take 20 mEq by mouth daily.     prazosin (MINIPRESS) 2 MG capsule Take 1 capsule (2 mg total) by mouth at bedtime. 30 capsule 1   rosuvastatin (CRESTOR) 40 MG tablet Take 1 tablet (40 mg total) by mouth daily. 30 tablet 0   sacubitril-valsartan (ENTRESTO) 97-103 MG Take 1 tablet by mouth 2 (two) times daily. 60 tablet    spironolactone (ALDACTONE) 25 MG tablet Take 1 tablet (25 mg total) by mouth daily.     torsemide (DEMADEX) 20 MG tablet Take 3 tablets (60 mg total) by mouth daily. 270 tablet 2   traZODone (DESYREL) 300 MG tablet Take 1 tablet (300 mg total) by mouth at bedtime as needed  for sleep. (Patient taking differently: Take 300 mg by mouth at bedtime.) 30 tablet 0   ARIPiprazole (ABILIFY) 5 MG tablet Take 1 tablet (5 mg total) by mouth daily. 30 tablet 0   DULoxetine (CYMBALTA) 30 MG capsule Take 3 capsules (90 mg total) by mouth daily. 90 capsule 0    Home: Home Living Family/patient expects to be discharged to:: Private residence Living Arrangements: Alone Available Help at Discharge: Other (Comment) (no) Type of Home: Apartment Home Access: Level entry Home Layout: One level Bathroom Shower/Tub: Health visitor: Standard Bathroom Accessibility: Yes Home Equipment: Information systems manager, Rollator (4 wheels), Electric scooter Additional Comments: 2.5 L O2 at all times  Lives With: Alone  Functional History: Prior Function Prior Level of  Function : Independent/Modified Independent, Driving Mobility Comments: rollator ADLs Comments: ind; has assist for cleaning Functional Status:  Mobility: Bed Mobility Overal bed mobility: Needs Assistance Bed Mobility: Sit to Supine Supine to sit: Contact guard Sit to supine: Min assist General bed mobility comments: up in recliner on arrival Transfers Overall transfer level: Needs assistance Equipment used: Rolling walker (2 wheels) Transfers: Sit to/from Stand, Bed to chair/wheelchair/BSC Sit to Stand: Contact guard assist Bed to/from chair/wheelchair/BSC transfer type:: Step pivot Step pivot transfers: Min assist General transfer comment: CGA from recliner and standard chair in hall x3 during session Ambulation/Gait Ambulation/Gait assistance: Contact guard assist (chair follow for safety) Gait Distance (Feet): 60 Feet (+ 60' + 120') Assistive device: Rolling walker (2 wheels) Gait Pattern/deviations: Step-to pattern, Decreased step length - right, Decreased dorsiflexion - right General Gait Details: steady gait with RW support, sliding RLE along floor initially, able to minimally clear with increased  distance, x2 seated rest breaks due to fatigue Gait velocity: decr    ADL: ADL Overall ADL's : Needs assistance/impaired Eating/Feeding: Set up, Sitting Grooming: Set up, Sitting Upper Body Bathing: Moderate assistance, Minimal assistance Lower Body Bathing: Maximal assistance Upper Body Dressing : Minimal assistance Lower Body Dressing: Maximal assistance Toilet Transfer: Moderate assistance, +2 for physical assistance Toileting- Clothing Manipulation and Hygiene: Minimal assistance Functional mobility during ADLs: Moderate assistance, +2 for physical assistance, Rolling walker (2 wheels)  Cognition: Cognition Overall Cognitive Status: Within Functional Limits for tasks assessed Orientation Level: Oriented X4 Attention: Sustained, Focused Focused Attention: Impaired Focused Attention Impairment: Verbal basic Sustained Attention: Impaired Sustained Attention Impairment: Verbal basic Memory: Impaired Memory Impairment: Storage deficit, Retrieval deficit, Decreased short term memory, Decreased recall of new information Decreased Short Term Memory: Verbal basic Awareness: Impaired Awareness Impairment: Intellectual impairment Problem Solving: Impaired Problem Solving Impairment: Verbal basic, Verbal complex Safety/Judgment: Appears intact Cognition Arousal: Alert Behavior During Therapy: WFL for tasks assessed/performed Overall Cognitive Status: Within Functional Limits for tasks assessed General Comments: oriented x4, aware of why he is at the hospital and events leading to admission. Recalls rehab admission for last stroke with good implementation of techniques learned in rehab  Blood pressure (!) 143/80, pulse 68, temperature 97.6 F (36.4 C), temperature source Oral, resp. rate 18, height 5\' 5"  (1.651 m), weight (!) 138.3 kg, SpO2 99%. Physical Exam Constitutional:      General: He is not in acute distress.    Appearance: He is obese. He is not ill-appearing.  HENT:      Head: Normocephalic and atraumatic.     Right Ear: External ear normal.     Nose: Nose normal.     Mouth/Throat:     Comments: Missing teeth Eyes:     Extraocular Movements: Extraocular movements intact.     Conjunctiva/sclera: Conjunctivae normal.     Pupils: Pupils are equal, round, and reactive to light.  Cardiovascular:     Rate and Rhythm: Normal rate.  Pulmonary:     Effort: Pulmonary effort is normal.  Abdominal:     Palpations: Abdomen is soft.  Musculoskeletal:        General: Swelling present.     Cervical back: Normal range of motion.  Skin:    General: Skin is warm.  Neurological:     Mental Status: He is alert.     Comments: Alert and oriented x 3. Normal insight and awareness. Intact Memory. Normal language and speech. Cranial nerve exam unremarkable except for right facial weakness. Visual acuity decreased?Marland Kitchen MMT: RUE 4/5  prox to distal. LUE 5/5. +right PD. RLE 2/5 HF, 3- to 3/5 KE, 2 to 2+ ADF/PF. Decreased LT RLE>RUE.  No abnl resting tone. DTR's 1+  Psychiatric:     Comments: Pt a little anxious but generally very pleasant.      Results for orders placed or performed during the hospital encounter of 06/12/23 (from the past 24 hours)  Glucose, capillary     Status: Abnormal   Collection Time: 06/14/23 11:35 AM  Result Value Ref Range   Glucose-Capillary 365 (H) 70 - 99 mg/dL  Glucose, capillary     Status: Abnormal   Collection Time: 06/14/23  4:27 PM  Result Value Ref Range   Glucose-Capillary 313 (H) 70 - 99 mg/dL   Comment 1 Notify RN   Glucose, capillary     Status: Abnormal   Collection Time: 06/14/23  9:26 PM  Result Value Ref Range   Glucose-Capillary 315 (H) 70 - 99 mg/dL  CBC     Status: Abnormal   Collection Time: 06/15/23  5:15 AM  Result Value Ref Range   WBC 7.9 4.0 - 10.5 K/uL   RBC 4.50 4.22 - 5.81 MIL/uL   Hemoglobin 12.9 (L) 13.0 - 17.0 g/dL   HCT 16.1 09.6 - 04.5 %   MCV 87.6 80.0 - 100.0 fL   MCH 28.7 26.0 - 34.0 pg   MCHC 32.7  30.0 - 36.0 g/dL   RDW 40.9 81.1 - 91.4 %   Platelets 235 150 - 400 K/uL   nRBC 0.0 0.0 - 0.2 %  Basic metabolic panel     Status: Abnormal   Collection Time: 06/15/23  5:15 AM  Result Value Ref Range   Sodium 133 (L) 135 - 145 mmol/L   Potassium 3.6 3.5 - 5.1 mmol/L   Chloride 101 98 - 111 mmol/L   CO2 24 22 - 32 mmol/L   Glucose, Bld 287 (H) 70 - 99 mg/dL   BUN 15 6 - 20 mg/dL   Creatinine, Ser 7.82 0.61 - 1.24 mg/dL   Calcium 8.7 (L) 8.9 - 10.3 mg/dL   GFR, Estimated >95 >62 mL/min   Anion gap 8 5 - 15  Glucose, capillary     Status: Abnormal   Collection Time: 06/15/23  7:18 AM  Result Value Ref Range   Glucose-Capillary 277 (H) 70 - 99 mg/dL   VAS US CAROTID Result Date: 06/14/2023 Carotid Arterial Duplex Study Patient Name:  Luis [name]  Date of Exam:   06/14/2023 Medical Rec #: [id number]              Accession #:    [id number] Date of Birth: May 01, 1971               Patient Gender: M Patient Age:   79 years Exam Location:  [facility] Procedure:      VAS US CAROTID Referring Phys: Elmer Picker --------------------------------------------------------------------------------  Indications:       CVA. Risk Factors:      Hypertension, hyperlipidemia, Diabetes, current smoker, prior                    CVA. Comparison Study:  None Performing Technologist: Shona Simpson  Examination Guidelines: A complete evaluation includes B-mode imaging, spectral Doppler, color Doppler, and power Doppler as needed of all accessible portions of each vessel. Bilateral testing is considered an integral part of a complete examination. Limited examinations for reoccurring indications may be performed as noted.  Right Carotid Findings: +----------+--------+--------+--------+------------------+--------+  PSV cm/sEDV cm/sStenosisPlaque DescriptionComments +----------+--------+--------+--------+------------------+--------+ CCA Prox  106     18                                          +----------+--------+--------+--------+------------------+--------+ CCA Distal97      18                                         +----------+--------+--------+--------+------------------+--------+ ICA Prox  80      19      1-39%   hypoechoic                 +----------+--------+--------+--------+------------------+--------+ ICA Distal48      17                                         +----------+--------+--------+--------+------------------+--------+ ECA       112     16                                         +----------+--------+--------+--------+------------------+--------+ +----------+--------+-------+--------+-------------------+           PSV cm/sEDV cmsDescribeArm Pressure (mmHG) +----------+--------+-------+--------+-------------------+ [id number]     0                                  +----------+--------+-------+--------+-------------------+ +---------+--------+--+--------+--+ VertebralPSV cm/s46EDV cm/s13 +---------+--------+--+--------+--+  Left Carotid Findings: +----------+--------+--------+--------+------------------+--------+           PSV cm/sEDV cm/sStenosisPlaque DescriptionComments +----------+--------+--------+--------+------------------+--------+ CCA Prox  133     19                                         +----------+--------+--------+--------+------------------+--------+ CCA Distal113     22                                         +----------+--------+--------+--------+------------------+--------+ ICA Prox  94      34      1-39%   smooth                     +----------+--------+--------+--------+------------------+--------+ ICA Distal65      23                                         +----------+--------+--------+--------+------------------+--------+ ECA       109     15                                         +----------+--------+--------+--------+------------------+--------+  +----------+--------+--------+--------+-------------------+           PSV cm/sEDV cm/sDescribeArm Pressure (mmHG) +----------+--------+--------+--------+-------------------+ Subclavian173     0                                   +----------+--------+--------+--------+-------------------+ +---------+--------+--+--------+--+  VertebralPSV cm/s43EDV cm/s13 +---------+--------+--+--------+--+   Summary: Right Carotid: Velocities in the right ICA are consistent with a 1-39% stenosis. Left Carotid: Velocities in the left ICA are consistent with a 1-39% stenosis. Vertebrals:  Bilateral vertebral arteries demonstrate antegrade flow. Subclavians: Normal flow hemodynamics were seen in bilateral subclavian              arteries. *See table(s) above for measurements and observations.  Electronically signed by Gerarda Fraction on 06/14/2023 at 6:51:19 PM.    Final    MR BRAIN WO CONTRAST Result Date: 06/14/2023 CLINICAL DATA:  Neuro deficit, acute, stroke suspected right sided weakness; Stroke, follow up EXAM: MRI HEAD WITHOUT CONTRAST MRA HEAD WITHOUT CONTRAST TECHNIQUE: Multiplanar, multi-echo pulse sequences of the brain and surrounding structures were acquired without intravenous contrast. Angiographic images of the Circle of Willis were acquired using MRA technique without intravenous contrast. COMPARISON:  Same-day head CT. CTA head and neck angiogram 10/25/2022 FINDINGS: MRI HEAD FINDINGS Brain: Negative for an acute infarct. No hemorrhage. No hydrocephalus. No extra-axial fluid collection. No mass lesion. No mass effect. Vascular: Normal flow voids. Skull and upper cervical spine: Normal marrow signal. Sinuses/Orbits: No middle ear or mastoid effusion. Paranasal sinuses are notable for mucosal thickening of the floor of bilateral maxillary sinuses. Orbits are unremarkable. Other: None. MRA HEAD FINDINGS Anterior circulation: No aneurysm. No vascular malformation. No proximal occlusion. There is moderate  narrowing in the A1 segment of the right ACA Posterior circulation: No aneurysm. No proximal occlusion. No vascular malformation. Anatomic variants: None IMPRESSION: 1. No acute intracranial abnormality. 2. No intracranial large vessel occlusion. Moderate narrowing in the A1 segment of the right ACA. Electronically Signed   By: Lorenza Cambridge M.D.   On: 06/14/2023 15:53   MR ANGIO HEAD WO CONTRAST Result Date: 06/14/2023 CLINICAL DATA:  Neuro deficit, acute, stroke suspected right sided weakness; Stroke, follow up EXAM: MRI HEAD WITHOUT CONTRAST MRA HEAD WITHOUT CONTRAST TECHNIQUE: Multiplanar, multi-echo pulse sequences of the brain and surrounding structures were acquired without intravenous contrast. Angiographic images of the Circle of Willis were acquired using MRA technique without intravenous contrast. COMPARISON:  Same-day head CT. CTA head and neck angiogram 10/25/2022 FINDINGS: MRI HEAD FINDINGS Brain: Negative for an acute infarct. No hemorrhage. No hydrocephalus. No extra-axial fluid collection. No mass lesion. No mass effect. Vascular: Normal flow voids. Skull and upper cervical spine: Normal marrow signal. Sinuses/Orbits: No middle ear or mastoid effusion. Paranasal sinuses are notable for mucosal thickening of the floor of bilateral maxillary sinuses. Orbits are unremarkable. Other: None. MRA HEAD FINDINGS Anterior circulation: No aneurysm. No vascular malformation. No proximal occlusion. There is moderate narrowing in the A1 segment of the right ACA Posterior circulation: No aneurysm. No proximal occlusion. No vascular malformation. Anatomic variants: None IMPRESSION: 1. No acute intracranial abnormality. 2. No intracranial large vessel occlusion. Moderate narrowing in the A1 segment of the right ACA. Electronically Signed   By: Lorenza Cambridge M.D.   On: 06/14/2023 15:53   ECHOCARDIOGRAM COMPLETE Result Date: 06/14/2023    ECHOCARDIOGRAM REPORT   Patient Name:   Luis [name] Date of  Exam: 06/14/2023 Medical Rec #:  [id number]             Height:       65.0 in Accession #:    [id number]            Weight:       305.0 lb Date of Birth:  11/13/1970  BSA:          2.367 m Patient Age:    52 years              BP:           140/78 mmHg Patient Gender: M                     HR:           87 bpm. Exam Location:  Inpatient Procedure: 2D Echo, Cardiac Doppler and Color Doppler Indications:    stroke  History:        Patient has prior history of Echocardiogram examinations, most                 recent 02/27/2022. CHF, Defibrillator and Pacemaker; Risk                 Factors:Dyslipidemia, Hypertension and Diabetes.  Sonographer:    Webb Laws Referring Phys: [id number] PRAMOD S SETHI IMPRESSIONS  1. Left ventricular ejection fraction, by estimation, is 25 to 30%. The left ventricle has normal function. The left ventricle demonstrates global hypokinesis. Left ventricular diastolic parameters are consistent with Grade I diastolic dysfunction (impaired relaxation).  2. Right ventricular systolic function is mildly reduced. The right ventricular size is normal. Tricuspid regurgitation signal is inadequate for assessing PA pressure.  3. The mitral valve is normal in structure. No evidence of mitral valve regurgitation.  4. The aortic valve is tricuspid. Aortic valve regurgitation is not visualized. No aortic stenosis is present.  5. The inferior vena cava is normal in size with greater than 50% respiratory variability, suggesting right atrial pressure of 3 mmHg. FINDINGS  Left Ventricle: Left ventricular ejection fraction, by estimation, is 25 to 30%. The left ventricle has normal function. The left ventricle demonstrates global hypokinesis. Definity contrast agent was given IV to delineate the left ventricular endocardial borders. The left ventricular internal cavity size was normal in size. There is no left ventricular hypertrophy. Left ventricular diastolic parameters are consistent with Grade I  diastolic dysfunction (impaired relaxation). Right Ventricle: The right ventricular size is normal. No increase in right ventricular wall thickness. Right ventricular systolic function is mildly reduced. Tricuspid regurgitation signal is inadequate for assessing PA pressure. Left Atrium: Left atrial size was normal in size. Right Atrium: Right atrial size was normal in size. Pericardium: There is no evidence of pericardial effusion. Mitral Valve: The mitral valve is normal in structure. No evidence of mitral valve regurgitation. Tricuspid Valve: The tricuspid valve is normal in structure. Tricuspid valve regurgitation is not demonstrated. Aortic Valve: The aortic valve is tricuspid. Aortic valve regurgitation is not visualized. No aortic stenosis is present. Aortic valve mean gradient measures 2.0 mmHg. Aortic valve peak gradient measures 3.7 mmHg. Pulmonic Valve: The pulmonic valve was normal in structure. Pulmonic valve regurgitation is not visualized. Aorta: The aortic root is normal in size and structure. Venous: The inferior vena cava is normal in size with greater than 50% respiratory variability, suggesting right atrial pressure of 3 mmHg. IAS/Shunts: No atrial level shunt detected by color flow Doppler. Additional Comments: A device lead is visualized in the right ventricle.  LEFT VENTRICLE PLAX 2D LVIDd:         5.00 cm      Diastology LVIDs:         3.90 cm      LV e' medial:   6.53 cm/s LV PW:  1.10 cm      LV E/e' medial: 12.8 LV IVS:        0.90 cm  LV Volumes (MOD) LV vol d, MOD A2C: 148.0 ml LV vol d, MOD A4C: 203.0 ml LV vol s, MOD A2C: 54.6 ml LV vol s, MOD A4C: 87.2 ml LV SV MOD A2C:     93.4 ml LV SV MOD A4C:     203.0 ml LV SV MOD BP:      105.5 ml RIGHT VENTRICLE RV Basal diam:  3.70 cm RV S prime:     13.70 cm/s TAPSE (M-mode): 1.8 cm LEFT ATRIUM             Index        RIGHT ATRIUM          Index LA diam:        4.00 cm 1.69 cm/m   RA Area:     9.39 cm LA Vol (A2C):   37.7 ml 15.93  ml/m  RA Volume:   15.90 ml 6.72 ml/m LA Vol (A4C):   20.7 ml 8.75 ml/m LA Biplane Vol: 27.9 ml 11.79 ml/m  AORTIC VALVE AV Vmax:      96.20 cm/s AV Vmean:     66.900 cm/s AV VTI:       0.159 m AV Peak Grad: 3.7 mmHg AV Mean Grad: 2.0 mmHg  AORTA Ao Root diam: 2.70 cm Ao Asc diam:  3.20 cm MITRAL VALVE MV Area (PHT): 5.13 cm MV Decel Time: 148 msec MV E velocity: 83.80 cm/s MV A velocity: 99.00 cm/s MV E/A ratio:  0.85 Dalton McleanMD Electronically signed by Wilfred Lacy Signature Date/Time: 06/14/2023/12:22:35 PM    Final     Assessment/Plan: Diagnosis: 52 yo male with left subcortical infarct with RLE>RUE hemiparesis and sensory loss Does the need for close, 24 hr/day medical supervision in concert with the patient's rehab needs make it unreasonable for this patient to be served in a less intensive setting? Yes Co-Morbidities requiring supervision/potential complications:  -csCHF -chronic hypoxic respiratory failure -Diabetes type II -OSA/CPAP/morbid obesity Due to bladder management, bowel management, safety, skin/wound care, disease management, medication administration, pain management, and patient education, does the patient require 24 hr/day rehab nursing? Yes Does the patient require coordinated care of a physician, rehab nurse, therapy disciplines of PT, OT to address physical and functional deficits in the context of the above medical diagnosis(es)? Yes Addressing deficits in the following areas: balance, endurance, locomotion, strength, transferring, bowel/bladder control, bathing, dressing, feeding, grooming, toileting, and psychosocial support Can the patient actively participate in an intensive therapy program of at least 3 hrs of therapy per day at least 5 days per week? Yes The potential for patient to make measurable gains while on inpatient rehab is excellent Anticipated functional outcomes upon discharge from inpatient rehab are modified independent  with PT, modified  independent with OT, n/a with SLP. Estimated rehab length of stay to reach the above functional goals is: 7 days Anticipated discharge destination: Home Overall Rehab/Functional Prognosis: excellent  POST ACUTE RECOMMENDATIONS: This patient's condition is appropriate for continued rehabilitative care in the following setting: CIR Patient has agreed to participate in recommended program. Yes Note that insurance prior authorization may be required for reimbursement for recommended care.  Comment: Pt lives alone in a handicapped apartment. He is motivated to regain functional independence. He can reach a modified independent level with a 7 day inpatient rehab admission. Rehab Admissions Coordinator to follow up.  I have personally performed a face to face diagnostic evaluation of this patient. Additionally, I have examined the patient's medical record including any pertinent labs and radiographic images. If the physician assistant has documented in this note, I have reviewed and edited or otherwise concur with the physician assistant's documentation.  Thanks,  Ranelle Oyster, MD 06/15/2023

## 2023-06-15 NOTE — PMR Pre-admission (Shared)
PMR Admission Coordinator Pre-Admission Assessment  Patient: Luis [name] is an 52 y.o., male MRN: [id number] DOB: 1970/10/30 Height: 5\' 5"  (165.1 cm) Weight: (!) 138.3 kg              Insurance Information HMO:     PPO:      PCP:      IPA:      80/20:      OTHER:  PRIMARY: [facility]      Policy#:  [id number]   Subscriber: pt CM Name: Luis [name]      Phone#:  [phone]      Fax#: [phone]  Pre-Cert#: [id number]      Employer:  Benefits:  Phone #:      Name:  Dolores Hoose. Date:  04/15/20    Deduct:       Out of Pocket Max:       Life Max:   CIR:       SNF:  Outpatient:      Co-Pay:  Home Health:       Co-Pay:  DME:      Co-Pay:  Providers:  SECONDARY: [employer]      Policy#: [id number]       Phone#:   TERTIARY:  Medicaid of [place]  Policy #:  [id number]    Financial Counselor:       Phone#:   The "Data Collection Information Summary" for patients in Inpatient Rehabilitation Facilities with attached "Privacy Act Statement-Health Care Records" was provided and verbally reviewed with: Patient  Emergency Contact Information Contact Information     Name Relation Home Work Mobile   [place] Relative [phone]        Other Contacts   None on File    Current Medical History  Patient Admitting Diagnosis: Right sided weakness, sensory loss  History of Present Illness: Luis [name] is a 52 y.o. male with a history of anxiety, csCHF, cocaine use, DM, OSA/CPAP, prior left CVA who presented to [facility] ED  on 06/12/23 with a 5 day history of right sided weakness and sensory loss. BP elevated upon arrival to 200+ systolic. He reported having had a fall 4 days prior with "mild" head injury. CT of head was negative for acute disease. MRI also without acute findings. UDS positive for cocaine Neurology feels pt suffered a left sided subcortical infarct. Recommended changing plavix to brillinta for stroke prevention.  P. Was seen by PT/OT and  they recommend CIR to assist return to PLOF.  Complete NIHSS TOTAL: 3 Glasgow Coma Scale Score: 15  Patient's medical record from [facility] has been reviewed by the rehabilitation admission coordinator and physician.  Past Medical History  Past Medical History:  Diagnosis Date   Anxiety    Biventricular ICD (implantable cardioverter-defibrillator) in place    Chronic systolic CHF (congestive heart failure) (HCC)    Cocaine use    Diabetes mellitus without complication (HCC)    GERD (gastroesophageal reflux disease)    HLD (hyperlipidemia)    Hypertension    Morbid obesity (HCC)    NICM (nonischemic cardiomyopathy) (HCC)    OSA (obstructive sleep apnea)    PTSD (post-traumatic stress disorder)    on Depakote   Refusal of blood transfusions as patient is Jehovah's Witness    Tobacco abuse     Has the patient had major surgery during 100 days prior to admission? No  Family History  family history includes Bone cancer in his father; Hypertension  in his mother; Lung cancer in his father.   Current Medications   Current Facility-Administered Medications:     stroke: early stages of recovery book, , Does not apply, Once, Tu, Ching T, DO   acetaminophen (TYLENOL) tablet 650 mg, 650 mg, Oral, Q4H PRN **OR** acetaminophen (TYLENOL) 160 MG/5ML solution 650 mg, 650 mg, Per Tube, Q4H PRN **OR** acetaminophen (TYLENOL) suppository 650 mg, 650 mg, Rectal, Q4H PRN, Tu, Ching T, DO   bumetanide (BUMEX) tablet 2 mg, 2 mg, Oral, BID, Tu, Ching T, DO, 2 mg at 06/15/23 0842   carvedilol (COREG) tablet 3.125 mg, 3.125 mg, Oral, BID WC, Tu, Ching T, DO, 3.125 mg at 06/15/23 0843   digoxin (LANOXIN) tablet 0.125 mg, 0.125 mg, Oral, Daily, Tu, Ching T, DO, 0.125 mg at 06/15/23 0843   enoxaparin (LOVENOX) injection 40 mg, 40 mg, Subcutaneous, Q24H, Tu, Ching T, DO, 40 mg at 06/14/23 2225   HYDROcodone-acetaminophen (NORCO/VICODIN) 5-325 MG per tablet 1 tablet, 1 tablet, Oral, Q4H  PRN, Opyd, Lavone Neri, MD, 1 tablet at 06/13/23 1842   hydrOXYzine (ATARAX) tablet 25 mg, 25 mg, Oral, TID PRN, Tu, Ching T, DO, 25 mg at 06/15/23 0846   insulin aspart (novoLOG) injection 0-20 Units, 0-20 Units, Subcutaneous, TID PC & HS, Tu, Ching T, DO, 20 Units at 06/15/23 1154   insulin aspart (novoLOG) injection 10 Units, 10 Units, Subcutaneous, TID WC, Rodolph Bong, MD, 10 Units at 06/15/23 1155   insulin glargine-yfgn (SEMGLEE) injection 40 Units, 40 Units, Subcutaneous, QHS, Rodolph Bong, MD   isoniazid (NYDRAZID) tablet 300 mg, 300 mg, Oral, Daily, Rodolph Bong, MD   living well with diabetes book MISC, , Does not apply, Once, Rodolph Bong, MD   morphine (PF) 2 MG/ML injection 1 mg, 1 mg, Intravenous, Q4H PRN, Rodolph Bong, MD   nystatin cream (MYCOSTATIN), , Topical, BID, Jerald Kief, MD, Given at 06/15/23 0844   pantoprazole (PROTONIX) EC tablet 40 mg, 40 mg, Oral, Daily, Tu, Ching T, DO, 40 mg at 06/15/23 0843   potassium chloride SA (KLOR-CON M) CR tablet 20 mEq, 20 mEq, Oral, Daily, Tu, Ching T, DO, 20 mEq at 06/15/23 0843   prazosin (MINIPRESS) capsule 2 mg, 2 mg, Oral, QHS, Rodolph Bong, MD   prochlorperazine (COMPAZINE) injection 10 mg, 10 mg, Intravenous, Q6H PRN, Rodolph Bong, MD   pyridOXINE (VITAMIN B6) tablet 100 mg, 100 mg, Oral, Daily, Rodolph Bong, MD   rosuvastatin (CRESTOR) tablet 40 mg, 40 mg, Oral, Daily, Tu, Ching T, DO, 40 mg at 06/15/23 2831   senna-docusate (Senokot-S) tablet 1 tablet, 1 tablet, Oral, QHS PRN, Tu, Ching T, DO   spironolactone (ALDACTONE) tablet 25 mg, 25 mg, Oral, Daily, Tu, Ching T, DO, 25 mg at 06/15/23 0843   ticagrelor (BRILINTA) tablet 90 mg, 90 mg, Oral, BID, Pearlean Brownie, Pramod S, MD, 90 mg at 06/15/23 0843   traZODone (DESYREL) tablet 300 mg, 300 mg, Oral, QHS PRN, Tu, Ching T, DO, 300 mg at 06/14/23 2224  Patients Current Diet:  Diet Order             DIET DYS 3 Room service appropriate?  Yes with Assist; Fluid consistency: Thin  Diet effective now                   Precautions / Restrictions Precautions Precautions: Fall Precaution Comments: 2.5L O2 baseline Restrictions Weight Bearing Restrictions Per Provider Order: No   Has the patient had  2 or more falls or a fall with injury in the past year?Yes  Prior Activity Level Limited Community (1-2x/wk): pt. went out for appts  Prior Functional Level Prior Function Prior Level of Function : Independent/Modified Independent, Driving Mobility Comments: rollator ADLs Comments: ind; has assist for cleaning  Self Care: Did the patient need help bathing, dressing, using the toilet or eating?  Needed some help  Indoor Mobility: Did the patient need assistance with walking from room to room (with or without device)? Needed some help  Stairs: Did the patient need assistance with internal or external stairs (with or without device)? Needed some help  Functional Cognition: Did the patient need help planning regular tasks such as shopping or remembering to take medications? Needed some help  Patient Information Are you of Hispanic, Latino/a,or Spanish origin?: E. Yes, another Hispanic, Latino, or Spanish origin What is your race?: Z. None of the above Do you need or want an interpreter to communicate with a doctor or health care staff?: 0. No  Patient's Response To:  Health Literacy and Transportation Is the patient able to respond to health literacy and transportation needs?: Yes Health Literacy - How often do you need to have someone help you when you read instructions, pamphlets, or other written material from your doctor or pharmacy?: Never In the past 12 months, has lack of transportation kept you from medical appointments or from getting medications?: No In the past 12 months, has lack of transportation kept you from meetings, work, or from getting things needed for daily living?: No  Journalist, newspaper /  Equipment Home Equipment: Information systems manager, Rollator (4 wheels), Electric scooter  Prior Device Use: Indicate devices/aids used by the patient prior to current illness, exacerbation or injury? Walker  Current Functional Level Cognition  Overall Cognitive Status: Within Functional Limits for tasks assessed Orientation Level: Oriented X4 General Comments: oriented x4, aware of why he is at the hospital and events leading to admission. Recalls rehab admission for last stroke with good implementation of techniques learned in rehab Attention: Sustained, Focused Focused Attention: Impaired Focused Attention Impairment: Verbal basic Sustained Attention: Impaired Sustained Attention Impairment: Verbal basic Memory: Impaired Memory Impairment: Storage deficit, Retrieval deficit, Decreased short term memory, Decreased recall of new information Decreased Short Term Memory: Verbal basic Awareness: Impaired Awareness Impairment: Intellectual impairment Problem Solving: Impaired Problem Solving Impairment: Verbal basic, Verbal complex Safety/Judgment: Appears intact    Extremity Assessment (includes Sensation/Coordination)  Upper Extremity Assessment: Defer to OT evaluation RUE Deficits / Details: 3/5 globally, decr [facility] RUE Sensation: decreased light touch, decreased proprioception RUE Coordination: decreased fine motor, decreased gross motor  Lower Extremity Assessment: RLE deficits/detail, LLE deficits/detail RLE Deficits / Details: Hip flexion 2/5, knee extension 3-/5, ankle dorsiflexion 2/5 LLE Deficits / Details: Grossly 5/5    ADLs  Overall ADL's : Needs assistance/impaired Eating/Feeding: Set up, Sitting Grooming: Set up, Sitting Upper Body Bathing: Moderate assistance, Minimal assistance Lower Body Bathing: Maximal assistance Upper Body Dressing : Minimal assistance Lower Body Dressing: Maximal assistance Toilet Transfer: Moderate assistance, +2 for physical assistance Toileting-  Clothing Manipulation and Hygiene: Minimal assistance Functional mobility during ADLs: Moderate assistance, +2 for physical assistance, Rolling walker (2 wheels)    Mobility  Overal bed mobility: Needs Assistance Bed Mobility: Sit to Supine Supine to sit: Contact guard Sit to supine: Min assist General bed mobility comments: up in recliner on arrival    Transfers  Overall transfer level: Needs assistance Equipment used: Rolling walker (2 wheels) Transfers: Sit  to/from Stand, Bed to chair/wheelchair/BSC Sit to Stand: Contact guard assist Bed to/from chair/wheelchair/BSC transfer type:: Step pivot Step pivot transfers: Min assist General transfer comment: CGA from recliner and standard chair in hall x3 during session    Ambulation / Gait / Stairs / Wheelchair Mobility  Ambulation/Gait Ambulation/Gait assistance: Contact guard assist (chair follow for safety) Gait Distance (Feet): 60 Feet (+ 60' + 120') Assistive device: Rolling walker (2 wheels) Gait Pattern/deviations: Step-to pattern, Decreased step length - right, Decreased dorsiflexion - right General Gait Details: steady gait with RW support, sliding RLE along floor initially, able to minimally clear with increased distance, x2 seated rest breaks due to fatigue Gait velocity: decr    Posture / Balance Balance Overall balance assessment: Needs assistance Sitting-balance support: Feet supported Sitting balance-Leahy Scale: Good Standing balance support: Bilateral upper extremity supported Standing balance-Leahy Scale: Poor Standing balance comment: reliant on external support    Special needs/care consideration Skin intact and Special service needs none     Previous Home Environment (from acute therapy documentation) Living Arrangements: Alone  Lives With: Alone Available Help at Discharge: Other (Comment) (no) Type of Home: Apartment Home Layout: One level Home Access: Level entry Bathroom Shower/Tub: Architectural technologist: Standard Bathroom Accessibility: Yes Home Care Services: No Additional Comments: 2.5 L O2 at all times  Discharge Living Setting Plans for Discharge Living Setting: House Type of Home at Discharge: House Discharge Home Layout: One level Discharge Home Access: Level entry Discharge Bathroom Shower/Tub: Walk-in shower Discharge Bathroom Toilet: Standard Discharge Bathroom Accessibility: Yes How Accessible: Accessible via wheelchair, Accessible via walker Does the patient have any problems obtaining your medications?: No  Social/Family/Support Systems Patient Roles: Other (Comment) Anticipated Caregiver: [name] (friend) Anticipated Caregiver's Contact Information: 24/7 Ability/Limitations of Caregiver: min A Discharge Plan Discussed with Primary Caregiver: Yes Is Caregiver In Agreement with Plan?: Yes Does Caregiver/Family have Issues with Lodging/Transportation while Pt is in Rehab?: No   Goals Patient/Family Goal for Rehab: PT/OT Mod I Expected length of stay: 7-10 days Pt/Family Agrees to Admission and willing to participate: Yes Program Orientation Provided & Reviewed with Pt/Caregiver Including Roles  & Responsibilities: Yes   Decrease burden of Care through IP rehab admission: Not anticipated   Possible need for SNF placement upon discharge: Not anticipated   Patient Condition: This patient's medical and functional status has changed since the consult dated: 06/15/23 in which the Rehabilitation Physician determined and documented that the patient's condition is appropriate for intensive rehabilitative care in an inpatient rehabilitation facility. Medical changes are: Pt. Stable for d/c .  Functional changes are: pt. CGA with mobility. After evaluating the patient today and speaking with the Rehabilitation physician and acute team, the patient remains appropriate for inpatient rehab. Will admit to inpatient rehab today.  Preadmission Screen  Completed By:  Jeronimo Greaves, CCC-SLP, 06/15/2023 1:13 PM ______________________________________________________________________   Discussed status with Dr. Carlis Abbott on 06/16/23 at 930 and received approval for admission today.  Admission Coordinator:  Jeronimo Greaves, time 1010/Date 06/16/23

## 2023-06-16 ENCOUNTER — Other Ambulatory Visit: Payer: Self-pay

## 2023-06-16 ENCOUNTER — Inpatient Hospital Stay (HOSPITAL_COMMUNITY)
Admission: AD | Admit: 2023-06-16 | Discharge: 2023-06-29 | DRG: 056 | Disposition: A | Discharge status: Home Health | Payer: No Typology Code available for payment source | Source: Ambulatory Visit | Attending: Physical Medicine & Rehabilitation | Admitting: Physical Medicine & Rehabilitation

## 2023-06-16 ENCOUNTER — Encounter (HOSPITAL_COMMUNITY): Payer: Self-pay | Admitting: Physical Medicine & Rehabilitation

## 2023-06-16 DIAGNOSIS — Z881 Allergy status to other antibiotic agents status: Secondary | ICD-10-CM | POA: Diagnosis not present

## 2023-06-16 DIAGNOSIS — E8770 Fluid overload, unspecified: Secondary | ICD-10-CM | POA: Diagnosis not present

## 2023-06-16 DIAGNOSIS — F32A Depression, unspecified: Secondary | ICD-10-CM | POA: Diagnosis present

## 2023-06-16 DIAGNOSIS — Z794 Long term (current) use of insulin: Secondary | ICD-10-CM

## 2023-06-16 DIAGNOSIS — R0981 Nasal congestion: Secondary | ICD-10-CM | POA: Diagnosis not present

## 2023-06-16 DIAGNOSIS — Z5982 Transportation insecurity: Secondary | ICD-10-CM

## 2023-06-16 DIAGNOSIS — Z888 Allergy status to other drugs, medicaments and biological substances status: Secondary | ICD-10-CM

## 2023-06-16 DIAGNOSIS — Z7902 Long term (current) use of antithrombotics/antiplatelets: Secondary | ICD-10-CM

## 2023-06-16 DIAGNOSIS — Z6841 Body Mass Index (BMI) 40.0 and over, adult: Secondary | ICD-10-CM

## 2023-06-16 DIAGNOSIS — G4733 Obstructive sleep apnea (adult) (pediatric): Secondary | ICD-10-CM | POA: Diagnosis present

## 2023-06-16 DIAGNOSIS — E1165 Type 2 diabetes mellitus with hyperglycemia: Secondary | ICD-10-CM | POA: Diagnosis present

## 2023-06-16 DIAGNOSIS — I5023 Acute on chronic systolic (congestive) heart failure: Secondary | ICD-10-CM | POA: Diagnosis not present

## 2023-06-16 DIAGNOSIS — I959 Hypotension, unspecified: Secondary | ICD-10-CM | POA: Diagnosis not present

## 2023-06-16 DIAGNOSIS — I10 Essential (primary) hypertension: Secondary | ICD-10-CM | POA: Diagnosis not present

## 2023-06-16 DIAGNOSIS — I420 Dilated cardiomyopathy: Secondary | ICD-10-CM | POA: Diagnosis not present

## 2023-06-16 DIAGNOSIS — F17210 Nicotine dependence, cigarettes, uncomplicated: Secondary | ICD-10-CM | POA: Diagnosis present

## 2023-06-16 DIAGNOSIS — F419 Anxiety disorder, unspecified: Secondary | ICD-10-CM | POA: Diagnosis present

## 2023-06-16 DIAGNOSIS — F4310 Post-traumatic stress disorder, unspecified: Secondary | ICD-10-CM | POA: Diagnosis present

## 2023-06-16 DIAGNOSIS — Z79899 Other long term (current) drug therapy: Secondary | ICD-10-CM

## 2023-06-16 DIAGNOSIS — K219 Gastro-esophageal reflux disease without esophagitis: Secondary | ICD-10-CM | POA: Diagnosis present

## 2023-06-16 DIAGNOSIS — Z713 Dietary counseling and surveillance: Secondary | ICD-10-CM

## 2023-06-16 DIAGNOSIS — Z91010 Allergy to peanuts: Secondary | ICD-10-CM

## 2023-06-16 DIAGNOSIS — I5042 Chronic combined systolic (congestive) and diastolic (congestive) heart failure: Secondary | ICD-10-CM | POA: Diagnosis present

## 2023-06-16 DIAGNOSIS — E785 Hyperlipidemia, unspecified: Secondary | ICD-10-CM | POA: Diagnosis present

## 2023-06-16 DIAGNOSIS — Z5986 Financial insecurity: Secondary | ICD-10-CM

## 2023-06-16 DIAGNOSIS — Z95810 Presence of automatic (implantable) cardiac defibrillator: Secondary | ICD-10-CM

## 2023-06-16 DIAGNOSIS — Z91040 Latex allergy status: Secondary | ICD-10-CM

## 2023-06-16 DIAGNOSIS — Z91018 Allergy to other foods: Secondary | ICD-10-CM

## 2023-06-16 DIAGNOSIS — Z886 Allergy status to analgesic agent status: Secondary | ICD-10-CM

## 2023-06-16 DIAGNOSIS — F1910 Other psychoactive substance abuse, uncomplicated: Secondary | ICD-10-CM | POA: Diagnosis present

## 2023-06-16 DIAGNOSIS — E875 Hyperkalemia: Secondary | ICD-10-CM | POA: Diagnosis present

## 2023-06-16 DIAGNOSIS — F1410 Cocaine abuse, uncomplicated: Secondary | ICD-10-CM | POA: Diagnosis not present

## 2023-06-16 DIAGNOSIS — E1140 Type 2 diabetes mellitus with diabetic neuropathy, unspecified: Secondary | ICD-10-CM | POA: Diagnosis present

## 2023-06-16 DIAGNOSIS — G4700 Insomnia, unspecified: Secondary | ICD-10-CM

## 2023-06-16 DIAGNOSIS — I639 Cerebral infarction, unspecified: Principal | ICD-10-CM | POA: Diagnosis present

## 2023-06-16 DIAGNOSIS — K5900 Constipation, unspecified: Secondary | ICD-10-CM | POA: Diagnosis not present

## 2023-06-16 DIAGNOSIS — Z531 Procedure and treatment not carried out because of patient's decision for reasons of belief and group pressure: Secondary | ICD-10-CM | POA: Diagnosis present

## 2023-06-16 DIAGNOSIS — I69351 Hemiplegia and hemiparesis following cerebral infarction affecting right dominant side: Principal | ICD-10-CM

## 2023-06-16 DIAGNOSIS — I110 Hypertensive heart disease with heart failure: Secondary | ICD-10-CM | POA: Diagnosis present

## 2023-06-16 DIAGNOSIS — Z716 Tobacco abuse counseling: Secondary | ICD-10-CM

## 2023-06-16 DIAGNOSIS — R531 Weakness: Secondary | ICD-10-CM | POA: Diagnosis not present

## 2023-06-16 DIAGNOSIS — I5041 Acute combined systolic (congestive) and diastolic (congestive) heart failure: Secondary | ICD-10-CM | POA: Diagnosis not present

## 2023-06-16 DIAGNOSIS — J9621 Acute and chronic respiratory failure with hypoxia: Secondary | ICD-10-CM | POA: Diagnosis present

## 2023-06-16 DIAGNOSIS — Z91041 Radiographic dye allergy status: Secondary | ICD-10-CM

## 2023-06-16 DIAGNOSIS — I428 Other cardiomyopathies: Secondary | ICD-10-CM | POA: Diagnosis present

## 2023-06-16 DIAGNOSIS — Z8249 Family history of ischemic heart disease and other diseases of the circulatory system: Secondary | ICD-10-CM

## 2023-06-16 DIAGNOSIS — Z880 Allergy status to penicillin: Secondary | ICD-10-CM

## 2023-06-16 DIAGNOSIS — Z7151 Drug abuse counseling and surveillance of drug abuser: Secondary | ICD-10-CM

## 2023-06-16 DIAGNOSIS — J9610 Chronic respiratory failure, unspecified whether with hypoxia or hypercapnia: Secondary | ICD-10-CM | POA: Diagnosis not present

## 2023-06-16 DIAGNOSIS — Z9981 Dependence on supplemental oxygen: Secondary | ICD-10-CM

## 2023-06-16 DIAGNOSIS — H938X2 Other specified disorders of left ear: Secondary | ICD-10-CM | POA: Diagnosis not present

## 2023-06-16 DIAGNOSIS — F449 Dissociative and conversion disorder, unspecified: Secondary | ICD-10-CM | POA: Diagnosis not present

## 2023-06-16 LAB — BASIC METABOLIC PANEL
Anion gap: 14 (ref 5–15)
BUN: 18 mg/dL (ref 6–20)
CO2: 19 mmol/L — ABNORMAL LOW (ref 22–32)
Calcium: 9.2 mg/dL (ref 8.9–10.3)
Chloride: 103 mmol/L (ref 98–111)
Creatinine, Ser: 1.47 mg/dL — ABNORMAL HIGH (ref 0.61–1.24)
GFR, Estimated: 56 mL/min — ABNORMAL LOW (ref >60)
Glucose, Bld: 277 mg/dL — ABNORMAL HIGH (ref 70–99)
Potassium: 5.2 mmol/L — ABNORMAL HIGH (ref 3.5–5.1)
Sodium: 136 mmol/L (ref 135–145)

## 2023-06-16 LAB — CBC
HCT: 44.7 % (ref 39.0–52.0)
Hemoglobin: 14.3 g/dL (ref 13.0–17.0)
MCH: 27.9 pg (ref 26.0–34.0)
MCHC: 32 g/dL (ref 30.0–36.0)
MCV: 87.3 fL (ref 80.0–100.0)
Platelets: 273 10*3/uL (ref 150–400)
RBC: 5.12 MIL/uL (ref 4.22–5.81)
RDW: 12.9 % (ref 11.5–15.5)
WBC: 9.1 10*3/uL (ref 4.0–10.5)
nRBC: 0 % (ref 0.0–0.2)

## 2023-06-16 LAB — GLUCOSE, CAPILLARY
Glucose-Capillary: 300 mg/dL — ABNORMAL HIGH (ref 70–99)
Glucose-Capillary: 269 mg/dL — ABNORMAL HIGH (ref 70–99)
Glucose-Capillary: 352 mg/dL — ABNORMAL HIGH (ref 70–99)
Glucose-Capillary: 257 mg/dL — ABNORMAL HIGH (ref 70–99)

## 2023-06-16 LAB — CREATININE, SERUM
Creatinine, Ser: 1.3 mg/dL — ABNORMAL HIGH (ref 0.61–1.24)
GFR, Estimated: >60 mL/min (ref >60)

## 2023-06-16 MED ORDER — INSULIN ASPART 100 UNIT/ML IJ SOLN
0.0000 [IU] | Freq: Three times a day (TID) | INTRAMUSCULAR | Status: DC
  Administered 2023-06-16: 11 [IU] via SUBCUTANEOUS
  Administered 2023-06-17: 15 [IU] via SUBCUTANEOUS
  Administered 2023-06-17: 11 [IU] via SUBCUTANEOUS
  Administered 2023-06-17: 15 [IU] via SUBCUTANEOUS
  Administered 2023-06-17: 7 [IU] via SUBCUTANEOUS
  Administered 2023-06-18: 11 [IU] via SUBCUTANEOUS
  Administered 2023-06-18 (×3): 7 [IU] via SUBCUTANEOUS
  Administered 2023-06-19: 4 [IU] via SUBCUTANEOUS
  Administered 2023-06-19: 11 [IU] via SUBCUTANEOUS
  Administered 2023-06-19: 7 [IU] via SUBCUTANEOUS
  Administered 2023-06-19: 3 [IU] via SUBCUTANEOUS
  Administered 2023-06-20: 11 [IU] via SUBCUTANEOUS
  Administered 2023-06-20: 7 [IU] via SUBCUTANEOUS
  Administered 2023-06-20 (×2): 4 [IU] via SUBCUTANEOUS
  Administered 2023-06-21 (×2): 7 [IU] via SUBCUTANEOUS
  Administered 2023-06-21: 11 [IU] via SUBCUTANEOUS
  Administered 2023-06-21: 4 [IU] via SUBCUTANEOUS
  Administered 2023-06-22: 3 [IU] via SUBCUTANEOUS
  Administered 2023-06-22: 7 [IU] via SUBCUTANEOUS
  Administered 2023-06-22: 11 [IU] via SUBCUTANEOUS
  Administered 2023-06-22: 4 [IU] via SUBCUTANEOUS
  Administered 2023-06-23: 11 [IU] via SUBCUTANEOUS
  Administered 2023-06-23: 4 [IU] via SUBCUTANEOUS
  Administered 2023-06-23: 7 [IU] via SUBCUTANEOUS
  Administered 2023-06-23 – 2023-06-24 (×2): 11 [IU] via SUBCUTANEOUS
  Administered 2023-06-24: 7 [IU] via SUBCUTANEOUS
  Administered 2023-06-24 (×2): 4 [IU] via SUBCUTANEOUS
  Administered 2023-06-25: 11 [IU] via SUBCUTANEOUS
  Administered 2023-06-25 (×3): 4 [IU] via SUBCUTANEOUS
  Administered 2023-06-26 (×2): 7 [IU] via SUBCUTANEOUS
  Administered 2023-06-26: 11 [IU] via SUBCUTANEOUS
  Administered 2023-06-26: 7 [IU] via SUBCUTANEOUS
  Administered 2023-06-27: 3 [IU] via SUBCUTANEOUS
  Administered 2023-06-27: 4 [IU] via SUBCUTANEOUS
  Administered 2023-06-27: 15 [IU] via SUBCUTANEOUS
  Administered 2023-06-27: 11 [IU] via SUBCUTANEOUS
  Administered 2023-06-28: 4 [IU] via SUBCUTANEOUS
  Administered 2023-06-28: 11 [IU] via SUBCUTANEOUS
  Administered 2023-06-28: 4 [IU] via SUBCUTANEOUS
  Administered 2023-06-28 – 2023-06-29 (×3): 7 [IU] via SUBCUTANEOUS

## 2023-06-16 MED ORDER — ISONIAZID 300 MG PO TABS
300.0000 mg | ORAL_TABLET | Freq: Every day | ORAL | Status: DC
  Administered 2023-06-17 – 2023-06-29 (×13): 300 mg via ORAL
  Filled 2023-06-16 (×13): qty 1

## 2023-06-16 MED ORDER — DAPAGLIFLOZIN PROPANEDIOL 10 MG PO TABS
10.0000 mg | ORAL_TABLET | Freq: Every day | ORAL | Status: DC
  Administered 2023-06-16: 10 mg via ORAL
  Filled 2023-06-16: qty 1

## 2023-06-16 MED ORDER — METOPROLOL SUCCINATE ER 50 MG PO TB24
150.0000 mg | ORAL_TABLET | Freq: Every day | ORAL | Status: DC
  Administered 2023-06-16: 150 mg via ORAL
  Filled 2023-06-16: qty 1

## 2023-06-16 MED ORDER — ACETAMINOPHEN 160 MG/5ML PO SOLN: 650.0000 mg | ORAL | Status: DC | PRN

## 2023-06-16 MED ORDER — INSULIN GLARGINE-YFGN 100 UNIT/ML ~~LOC~~ SOLN
50.0000 [IU] | Freq: Every day | SUBCUTANEOUS | Status: DC
  Filled 2023-06-16: qty 0.5

## 2023-06-16 MED ORDER — TRAZODONE HCL 100 MG PO TABS: 300.0000 mg | ORAL_TABLET | Freq: Every day | ORAL | Status: DC

## 2023-06-16 MED ORDER — ACETAMINOPHEN 325 MG PO TABS
650.0000 mg | ORAL_TABLET | ORAL | Status: DC | PRN
  Administered 2023-06-28: 650 mg via ORAL
  Filled 2023-06-16: qty 2

## 2023-06-16 MED ORDER — HYDROXYZINE HCL 25 MG PO TABS
25.0000 mg | ORAL_TABLET | Freq: Three times a day (TID) | ORAL | Status: DC | PRN
  Administered 2023-06-22 – 2023-06-29 (×7): 25 mg via ORAL
  Filled 2023-06-16 (×7): qty 1

## 2023-06-16 MED ORDER — PRAZOSIN HCL 2 MG PO CAPS
2.0000 mg | ORAL_CAPSULE | Freq: Every day | ORAL | Status: DC
  Administered 2023-06-16 – 2023-06-28 (×13): 2 mg via ORAL
  Filled 2023-06-16 (×14): qty 1

## 2023-06-16 MED ORDER — FLUTICASONE PROPIONATE 50 MCG/ACT NA SUSP
2.0000 | Freq: Every day | NASAL | Status: DC
  Administered 2023-06-16: 2 via NASAL
  Filled 2023-06-16: qty 16

## 2023-06-16 MED ORDER — ENOXAPARIN SODIUM 40 MG/0.4ML IJ SOSY
40.0000 mg | PREFILLED_SYRINGE | INTRAMUSCULAR | Status: DC
  Administered 2023-06-16 – 2023-06-22 (×7): 40 mg via SUBCUTANEOUS
  Filled 2023-06-16 (×7): qty 0.4

## 2023-06-16 MED ORDER — INSULIN ASPART 100 UNIT/ML IJ SOLN
15.0000 [IU] | Freq: Three times a day (TID) | INTRAMUSCULAR | Status: DC
  Administered 2023-06-16: 15 [IU] via SUBCUTANEOUS

## 2023-06-16 MED ORDER — DULOXETINE HCL 30 MG PO CPEP
30.0000 mg | ORAL_CAPSULE | Freq: Every day | ORAL | Status: DC
  Administered 2023-06-16: 30 mg via ORAL
  Filled 2023-06-16: qty 1

## 2023-06-16 MED ORDER — BUMETANIDE 2 MG PO TABS
2.0000 mg | ORAL_TABLET | Freq: Two times a day (BID) | ORAL | Status: DC
  Administered 2023-06-17 – 2023-06-19 (×5): 2 mg via ORAL
  Filled 2023-06-16 (×7): qty 1

## 2023-06-16 MED ORDER — VITAMIN B-6 100 MG PO TABS
100.0000 mg | ORAL_TABLET | Freq: Every day | ORAL | Status: DC
  Administered 2023-06-17 – 2023-06-29 (×13): 100 mg via ORAL
  Filled 2023-06-16 (×13): qty 1

## 2023-06-16 MED ORDER — DULOXETINE HCL 30 MG PO CPEP
30.0000 mg | ORAL_CAPSULE | Freq: Every day | ORAL | Status: DC
Start: 2023-06-17 — End: 2023-06-29
  Administered 2023-06-17 – 2023-06-29 (×13): 30 mg via ORAL
  Filled 2023-06-16 (×13): qty 1

## 2023-06-16 MED ORDER — INSULIN ASPART 100 UNIT/ML IJ SOLN
15.0000 [IU] | Freq: Three times a day (TID) | INTRAMUSCULAR | Status: DC
  Administered 2023-06-17 – 2023-06-29 (×36): 15 [IU] via SUBCUTANEOUS

## 2023-06-16 MED ORDER — DAPAGLIFLOZIN PROPANEDIOL 10 MG PO TABS
10.0000 mg | ORAL_TABLET | Freq: Every day | ORAL | Status: DC
Start: 2023-06-17 — End: 2023-06-29
  Administered 2023-06-17 – 2023-06-29 (×13): 10 mg via ORAL
  Filled 2023-06-16 (×13): qty 1

## 2023-06-16 MED ORDER — HYDROCODONE-ACETAMINOPHEN 5-325 MG PO TABS
1.0000 | ORAL_TABLET | ORAL | Status: DC | PRN
  Administered 2023-06-17 – 2023-06-29 (×30): 1 via ORAL
  Filled 2023-06-16 (×33): qty 1

## 2023-06-16 MED ORDER — INSULIN GLARGINE-YFGN 100 UNIT/ML ~~LOC~~ SOLN
50.0000 [IU] | Freq: Every day | SUBCUTANEOUS | Status: DC
  Administered 2023-06-16 – 2023-06-18 (×3): 50 [IU] via SUBCUTANEOUS
  Filled 2023-06-16 (×4): qty 0.5

## 2023-06-16 MED ORDER — ROSUVASTATIN CALCIUM 20 MG PO TABS
20.0000 mg | ORAL_TABLET | Freq: Every day | ORAL | Status: DC
  Administered 2023-06-17 – 2023-06-29 (×13): 20 mg via ORAL
  Filled 2023-06-16 (×13): qty 1

## 2023-06-16 MED ORDER — LORATADINE 10 MG PO TABS
10.0000 mg | ORAL_TABLET | Freq: Every day | ORAL | Status: DC
Start: 2023-06-16 — End: 2023-06-16
  Administered 2023-06-16: 10 mg via ORAL
  Filled 2023-06-16: qty 1

## 2023-06-16 MED ORDER — METOPROLOL SUCCINATE ER 50 MG PO TB24
150.0000 mg | ORAL_TABLET | Freq: Every day | ORAL | Status: DC
Start: 2023-06-17 — End: 2023-06-21
  Administered 2023-06-17 – 2023-06-21 (×5): 150 mg via ORAL
  Filled 2023-06-16 (×5): qty 3

## 2023-06-16 MED ORDER — DIGOXIN 125 MCG PO TABS
0.1250 mg | ORAL_TABLET | Freq: Every day | ORAL | Status: DC
  Administered 2023-06-17 – 2023-06-29 (×13): 0.125 mg via ORAL
  Filled 2023-06-16 (×13): qty 1

## 2023-06-16 MED ORDER — ROSUVASTATIN CALCIUM 20 MG PO TABS
20.0000 mg | ORAL_TABLET | Freq: Every day | ORAL | Status: DC
Start: 2023-06-17 — End: 2023-06-16

## 2023-06-16 MED ORDER — ENOXAPARIN SODIUM 40 MG/0.4ML IJ SOSY: 40.0000 mg | PREFILLED_SYRINGE | INTRAMUSCULAR | Status: DC

## 2023-06-16 MED ORDER — TRAZODONE HCL 50 MG PO TABS
300.0000 mg | ORAL_TABLET | Freq: Every day | ORAL | Status: DC
  Administered 2023-06-16 – 2023-06-28 (×13): 300 mg via ORAL
  Filled 2023-06-16 (×13): qty 6

## 2023-06-16 MED ORDER — SPIRONOLACTONE 25 MG PO TABS
25.0000 mg | ORAL_TABLET | Freq: Every day | ORAL | Status: DC
Start: 2023-06-17 — End: 2023-06-29
  Administered 2023-06-17 – 2023-06-29 (×13): 25 mg via ORAL
  Filled 2023-06-16 (×15): qty 1

## 2023-06-16 MED ORDER — PANTOPRAZOLE SODIUM 40 MG PO TBEC
40.0000 mg | DELAYED_RELEASE_TABLET | Freq: Every day | ORAL | Status: DC
Start: 2023-06-17 — End: 2023-06-29
  Administered 2023-06-17 – 2023-06-29 (×13): 40 mg via ORAL
  Filled 2023-06-16 (×13): qty 1

## 2023-06-16 MED ORDER — CLOPIDOGREL BISULFATE 75 MG PO TABS
75.0000 mg | ORAL_TABLET | Freq: Every day | ORAL | Status: DC
Start: 2023-06-17 — End: 2023-06-29
  Administered 2023-06-17 – 2023-06-29 (×13): 75 mg via ORAL
  Filled 2023-06-16 (×13): qty 1

## 2023-06-16 MED ORDER — SENNOSIDES-DOCUSATE SODIUM 8.6-50 MG PO TABS: 1.0000 | ORAL_TABLET | Freq: Every evening | ORAL | Status: DC | PRN

## 2023-06-16 MED ORDER — NYSTATIN 100000 UNIT/GM EX CREA
TOPICAL_CREAM | Freq: Two times a day (BID) | CUTANEOUS | Status: DC
  Filled 2023-06-16: qty 30

## 2023-06-16 MED ORDER — ACETAMINOPHEN 650 MG RE SUPP: 650.0000 mg | RECTAL | Status: DC | PRN

## 2023-06-16 NOTE — Progress Notes (Signed)
SLP Cancellation Note  Patient Details Name: Luis [name] MRN: [id number] DOB: [birth year]   Cancelled treatment:       Reason Eval/Treat Not Completed: Medical issues which prohibited therapy (Pt currently resting on CPAP. Will continue efforts as appropriate.)  Clyde Canterbury, M.S., CCC-SLP Speech-Language Pathologist Secure Chat Preferred  O: [phone]  Luis [name] 06/16/2023, 10:35 AM

## 2023-06-16 NOTE — H&P (Signed)
Physical Medicine and Rehabilitation Admission H&P  CC: Left sided subcortical infarct  HPI: Luis [name] is a 52 year old right-handed male Jehovah witness with history of biventricular ICD implantable device/chronic systolic congestive heart failure/nonischemic cardiomyopathy, diabetes mellitus, hyperlipidemia, chronic hypoxic respiratory failure on 2 L oxygen, hypertension, morbid obesity with BMI of 50.75, PTSD, tobacco as well as polysubstance abuse.  Review patient lives alone.  1 level apartment.  Independent with ADLs and uses a rollator for mobility.  Presented 06/12/2023 with left-sided headache x 3 days as well as right side weakness x 2 days.  He reported a fall 4 days prior question mild head injury.  Noted systolic blood pressures in the 200s and glucose in the 500s.  CT/MRI showed no acute intracranial abnormality.  MRA showed no intracranial large vessel occlusion.  Admission chemistries unremarkable except glucose 421, latest urine drug screen negative, BNP 119, troponin negative, hemoglobin A1c 12.0.  Echocardiogram ejection fraction of 25 to 30%.  Left ventricle demonstrating global hypokinesis.  Carotid Dopplers with 1-39% stenosis.  Neurology follow-up strokelike episode with right hemiparesis hemisensory loss likely due to suspect left subcortical infarct/conversion reaction.  Currently maintained on Plavix for CVA prophylaxis no aspirin due to allergy.  Lovenox added for DVT prophylaxis.  Presently on mechanical soft diet.  Therapy evaluations completed due to patient's decreased functional mobility was admitted for a comprehensive rehab program. Currently working with therapy.  Review of Systems  Constitutional:  Positive for malaise/fatigue.  HENT:  Negative for hearing loss.   Eyes:  Positive for blurred vision.  Respiratory:  Negative for cough, shortness of breath and wheezing.   Cardiovascular:  Positive for leg swelling. Negative for chest pain and  palpitations.  Gastrointestinal:  Positive for constipation. Negative for heartburn, nausea and vomiting.       GERD  Genitourinary:  Negative for dysuria, flank pain and hematuria.  Musculoskeletal:  Positive for falls, joint pain and myalgias.  Skin:  Negative for rash.  Neurological:  Positive for dizziness, sensory change, focal weakness and headaches. Negative for weakness.  Psychiatric/Behavioral:         Anxiety/PTSD  All other systems reviewed and are negative.  Past Medical History:  Diagnosis Date   Anxiety    Biventricular ICD (implantable cardioverter-defibrillator) in place    Chronic systolic CHF (congestive heart failure) (HCC)    Cocaine use    Diabetes mellitus without complication (HCC)    GERD (gastroesophageal reflux disease)    HLD (hyperlipidemia)    Hypertension    Morbid obesity (HCC)    NICM (nonischemic cardiomyopathy) (HCC)    OSA (obstructive sleep apnea)    PTSD (post-traumatic stress disorder)    on Depakote   Refusal of blood transfusions as patient is Jehovah's Witness    Tobacco abuse    Past Surgical History:  Procedure Laterality Date   BIV ICD INSERTION CRT-D     FOOT FRACTURE SURGERY     ICD GENERATOR CHANGEOUT N/A 03/23/2022   Procedure: ICD GENERATOR CHANGEOUT;  Surgeon: Nelly Laurence, Roberts Gaudy, MD;  Location: [facility] INVASIVE CV LAB;  Service: Cardiovascular;  Laterality: N/A;   RIGHT HEART CATH N/A 03/06/2022   Procedure: RIGHT HEART CATH;  Surgeon: Orbie Pyo, MD;  Location: [facility] INVASIVE CV LAB;  Service: Cardiovascular;  Laterality: N/A;   RIGHT/LEFT HEART CATH AND CORONARY ANGIOGRAPHY N/A 04/16/2022   Procedure: RIGHT/LEFT HEART CATH AND CORONARY ANGIOGRAPHY;  Surgeon: Laurey Morale, MD;  Location: [facility] INVASIVE CV LAB;  Service: Cardiovascular;  Laterality: N/A;   ROTATOR CUFF REPAIR     TONSILLECTOMY     Family History  Problem Relation Age of Onset   Hypertension Mother    Bone cancer Father    Lung cancer Father    Social  History:  reports that he has been smoking cigarettes. He started smoking about 16 years ago. He has a 7.5 pack-year smoking history. He has never used smokeless tobacco. He reports that he does not currently use alcohol. He reports current drug use. Drugs: Cocaine and Marijuana. Allergies:  Allergies  Allergen Reactions   Aspirin Anaphylaxis and Other (See Comments)    Throat closing    Iodine-131 Hives   Peanut (Diagnostic) Anaphylaxis    Throat closes   Iodinated Contrast Media Hives    Can be premedicated    Latex Hives and Rash   Penicillins Nausea And Vomiting   Ultram [Tramadol] Hives and Nausea Only   Zestril [Lisinopril] Cough   Cinnamon    Amoxicillin-Pot Clavulanate Diarrhea   Lidocaine Rash    Rash from adhesive on lidocaine patches    Medications Prior to Admission  Medication Sig Dispense Refill   acetaminophen (TYLENOL) 325 MG tablet Take 2 tablets (650 mg total) by mouth every 6 (six) hours as needed for mild pain.     bumetanide (BUMEX) 2 MG tablet Take 2 mg by mouth 2 (two) times daily.     clopidogrel (PLAVIX) 75 MG tablet Take 1 tablet (75 mg total) by mouth daily. 30 tablet 0   dapagliflozin propanediol (FARXIGA) 10 MG TABS tablet Take 1 tablet (10 mg total) by mouth daily. 90 tablet 2   digoxin (LANOXIN) 0.125 MG tablet Take 0.125 mg by mouth daily.     hydrOXYzine (ATARAX) 25 MG tablet Take 1 tablet (25 mg total) by mouth 3 (three) times daily as needed for anxiety. 75 tablet 1   Insulin Aspart FlexPen (NOVOLOG) 100 UNIT/ML Inject 25 Units into the skin 3 (three) times daily before meals.     insulin glargine (LANTUS) 100 UNIT/ML injection Inject 70 Units into the skin at bedtime.     isoniazid (NYDRAZID) 300 MG tablet Take 1 tablet by mouth daily.     metoprolol succinate (TOPROL-XL) 50 MG 24 hr tablet Take 150 mg by mouth daily. Take with or immediately following a meal.     pantoprazole (PROTONIX) 40 MG tablet Take 40 mg by mouth daily.     potassium  chloride SA (KLOR-CON M) 20 MEQ tablet Take 20 mEq by mouth daily.     prazosin (MINIPRESS) 2 MG capsule Take 1 capsule (2 mg total) by mouth at bedtime. 30 capsule 1   rosuvastatin (CRESTOR) 40 MG tablet Take 1 tablet (40 mg total) by mouth daily. (Patient taking differently: Take 20 mg by mouth daily.) 30 tablet 0   sacubitril-valsartan (ENTRESTO) 24-26 MG Take 1 tablet by mouth 2 (two) times daily.     spironolactone (ALDACTONE) 25 MG tablet Take 1 tablet (25 mg total) by mouth daily.     traZODone (DESYREL) 300 MG tablet Take 1 tablet (300 mg total) by mouth at bedtime as needed for sleep. (Patient taking differently: Take 300 mg by mouth at bedtime.) 30 tablet 0   DULoxetine (CYMBALTA) 30 MG capsule Take 3 capsules (90 mg total) by mouth daily. 90 capsule 0   Home: Home Living Family/patient expects to be discharged to:: Private residence Living Arrangements: Alone Available Help at Discharge: Other (Comment) (no) Type of Home: Apartment  Home Access: Level entry Home Layout: One level Bathroom Shower/Tub: Health visitor: Standard Bathroom Accessibility: Yes Home Equipment: Information systems manager, Rollator (4 wheels), Electric scooter Additional Comments: 2.5 L O2 at all times  Lives With: Alone   Functional History: Prior Function Prior Level of Function : Independent/Modified Independent, Driving Mobility Comments: rollator ADLs Comments: ind; has assist for cleaning   Functional Status:  Mobility: Bed Mobility Overal bed mobility: Needs Assistance Bed Mobility: Sit to Supine Supine to sit: Contact guard Sit to supine: Min assist General bed mobility comments: up in recliner on arrival Transfers Overall transfer level: Needs assistance Equipment used: Rolling walker (2 wheels) Transfers: Sit to/from Stand, Bed to chair/wheelchair/BSC Sit to Stand: Contact guard assist Bed to/from chair/wheelchair/BSC transfer type:: Step pivot Step pivot transfers: Min  assist General transfer comment: CGA from recliner and standard chair in hall x3 during session Ambulation/Gait Ambulation/Gait assistance: Contact guard assist (chair follow for safety) Gait Distance (Feet): 60 Feet (+ 60' + 120') Assistive device: Rolling walker (2 wheels) Gait Pattern/deviations: Step-to pattern, Decreased step length - right, Decreased dorsiflexion - right General Gait Details: steady gait with RW support, sliding RLE along floor initially, able to minimally clear with increased distance, x2 seated rest breaks due to fatigue Gait velocity: decr   ADL: ADL Overall ADL's : Needs assistance/impaired Eating/Feeding: Set up, Sitting Grooming: Set up Grooming Details (indicate cue type and reason): shaves sitting/standing at sink Upper Body Bathing: Moderate assistance, Minimal assistance Lower Body Bathing: Maximal assistance Upper Body Dressing : Minimal assistance Lower Body Dressing: Maximal assistance Toilet Transfer: Contact guard assist, Ambulation, Rolling walker (2 wheels) Toileting- Clothing Manipulation and Hygiene: Minimal assistance Functional mobility during ADLs: Contact guard assist, Rolling walker (2 wheels)   Cognition: Cognition Overall Cognitive Status: Within Functional Limits for tasks assessed Orientation Level: Oriented X4 Attention: Sustained, Focused Focused Attention: Impaired Focused Attention Impairment: Verbal basic Sustained Attention: Impaired Sustained Attention Impairment: Verbal basic Memory: Impaired Memory Impairment: Storage deficit, Retrieval deficit, Decreased short term memory, Decreased recall of new information Decreased Short Term Memory: Verbal basic Awareness: Impaired Awareness Impairment: Intellectual impairment Problem Solving: Impaired Problem Solving Impairment: Verbal basic, Verbal complex Safety/Judgment: Appears intact Cognition Arousal: Alert Behavior During Therapy: WFL for tasks  assessed/performed Overall Cognitive Status: Within Functional Limits for tasks assessed General Comments: oriented x4, aware of why he is at the hospital and events leading to admission. Recalls rehab admission for last stroke with good implementation of techniques learned in rehab    Physical Exam: There were no vitals taken for this visit. Gen: no distress, normal appearing, BMI 50.75 HEENT: oral mucosa pink and moist, NCAT Cardio: Reg rate Chest: normal effort, normal rate of breathing Abd: soft, non-distended Ext: no edema Psych: pleasant, normal affect Skin: intact Neuro: AOx3, Left side intact, RUE 4/5, RLE 3/5 proximally, 0/5 distally. Sensation decreased in RLE.   Results for orders placed or performed during the hospital encounter of 06/12/23 (from the past 48 hours)  Glucose, capillary     Status: Abnormal   Collection Time: 06/14/23  4:27 PM  Result Value Ref Range   Glucose-Capillary 313 (H) 70 - 99 mg/dL    Comment: Glucose reference range applies only to samples taken after fasting for at least 8 hours.   Comment 1 Notify RN   Glucose, capillary     Status: Abnormal   Collection Time: 06/14/23  9:26 PM  Result Value Ref Range   Glucose-Capillary 315 (H) 70 - 99 mg/dL  Comment: Glucose reference range applies only to samples taken after fasting for at least 8 hours.  CBC     Status: Abnormal   Collection Time: 06/15/23  5:15 AM  Result Value Ref Range   WBC 7.9 4.0 - 10.5 K/uL   RBC 4.50 4.22 - 5.81 MIL/uL   Hemoglobin 12.9 (L) 13.0 - 17.0 g/dL   HCT 82.9 56.2 - 13.0 %   MCV 87.6 80.0 - 100.0 fL   MCH 28.7 26.0 - 34.0 pg   MCHC 32.7 30.0 - 36.0 g/dL   RDW 86.5 78.4 - 69.6 %   Platelets 235 150 - 400 K/uL   nRBC 0.0 0.0 - 0.2 %    Comment: Performed at [facility], [address] [place], [place] [address]  Basic metabolic panel     Status: Abnormal   Collection Time: 06/15/23  5:15 AM  Result Value Ref Range   Sodium 133 (L) 135 - 145 mmol/L    Potassium 3.6 3.5 - 5.1 mmol/L   Chloride 101 98 - 111 mmol/L   CO2 24 22 - 32 mmol/L   Glucose, Bld 287 (H) 70 - 99 mg/dL    Comment: Glucose reference range applies only to samples taken after fasting for at least 8 hours.   BUN 15 6 - 20 mg/dL   Creatinine, Ser 4.13 0.61 - 1.24 mg/dL   Calcium 8.7 (L) 8.9 - 10.3 mg/dL   GFR, Estimated >24 >40 mL/min    Comment: (NOTE) Calculated using the CKD-EPI Creatinine Equation (2021)    Anion gap 8 5 - 15    Comment: Performed at [facility], [address] [place], [place] [address]  Glucose, capillary     Status: Abnormal   Collection Time: 06/15/23  7:18 AM  Result Value Ref Range   Glucose-Capillary 277 (H) 70 - 99 mg/dL    Comment: Glucose reference range applies only to samples taken after fasting for at least 8 hours.  Glucose, capillary     Status: Abnormal   Collection Time: 06/15/23 11:25 AM  Result Value Ref Range   Glucose-Capillary 360 (H) 70 - 99 mg/dL    Comment: Glucose reference range applies only to samples taken after fasting for at least 8 hours.  Glucose, capillary     Status: Abnormal   Collection Time: 06/15/23  4:49 PM  Result Value Ref Range   Glucose-Capillary 301 (H) 70 - 99 mg/dL    Comment: Glucose reference range applies only to samples taken after fasting for at least 8 hours.   Comment 1 Notify RN   Glucose, capillary     Status: Abnormal   Collection Time: 06/15/23  9:06 PM  Result Value Ref Range   Glucose-Capillary 265 (H) 70 - 99 mg/dL    Comment: Glucose reference range applies only to samples taken after fasting for at least 8 hours.  Glucose, capillary     Status: Abnormal   Collection Time: 06/16/23  6:20 AM  Result Value Ref Range   Glucose-Capillary 300 (H) 70 - 99 mg/dL    Comment: Glucose reference range applies only to samples taken after fasting for at least 8 hours.  Basic metabolic panel     Status: Abnormal   Collection Time: 06/16/23 11:37 AM  Result Value Ref Range   Sodium  136 135 - 145 mmol/L   Potassium 5.2 (H) 3.5 - 5.1 mmol/L   Chloride 103 98 - 111 mmol/L   CO2 19 (  L) 22 - 32 mmol/L   Glucose, Bld 277 (H) 70 - 99 mg/dL    Comment: Glucose reference range applies only to samples taken after fasting for at least 8 hours.   BUN 18 6 - 20 mg/dL   Creatinine, Ser 0.98 (H) 0.61 - 1.24 mg/dL   Calcium 9.2 8.9 - 11.9 mg/dL   GFR, Estimated 56 (L) >60 mL/min    Comment: (NOTE) Calculated using the CKD-EPI Creatinine Equation (2021)    Anion gap 14 5 - 15    Comment: Performed at [facility], [address], [place], [place] [address]  Glucose, capillary     Status: Abnormal   Collection Time: 06/16/23 11:52 AM  Result Value Ref Range   Glucose-Capillary 269 (H) 70 - 99 mg/dL    Comment: Glucose reference range applies only to samples taken after fasting for at least 8 hours.   Comment 1 Notify RN    Comment 2 Document in Chart    VAS US CAROTID Result Date: 06/14/2023 Carotid Arterial Duplex Study Patient Name:  DONNA DECOTEAU  Date of Exam:   06/14/2023 Medical Rec #: [id number]              Accession #:    [id number] Date of Birth: Aug 19, 1970               Patient Gender: M Patient Age:   23 years Exam Location:  [facility] Procedure:      VAS US CAROTID Referring Phys: Elmer Picker --------------------------------------------------------------------------------  Indications:       CVA. Risk Factors:      Hypertension, hyperlipidemia, Diabetes, current smoker, prior                    CVA. Comparison Study:  None Performing Technologist: Shona Simpson  Examination Guidelines: A complete evaluation includes B-mode imaging, spectral Doppler, color Doppler, and power Doppler as needed of all accessible portions of each vessel. Bilateral testing is considered an integral part of a complete examination. Limited examinations for reoccurring indications may be performed as noted.  Right Carotid Findings:  +----------+--------+--------+--------+------------------+--------+           PSV cm/sEDV cm/sStenosisPlaque DescriptionComments +----------+--------+--------+--------+------------------+--------+ CCA Prox  106     18                                         +----------+--------+--------+--------+------------------+--------+ CCA Distal97      18                                         +----------+--------+--------+--------+------------------+--------+ ICA Prox  80      19      1-39%   hypoechoic                 +----------+--------+--------+--------+------------------+--------+ ICA Distal48      17                                         +----------+--------+--------+--------+------------------+--------+ ECA       112     16                                         +----------+--------+--------+--------+------------------+--------+ +----------+--------+-------+--------+-------------------+  PSV cm/sEDV cmsDescribeArm Pressure (mmHG) +----------+--------+-------+--------+-------------------+ [id number]     0                                  +----------+--------+-------+--------+-------------------+ +---------+--------+--+--------+--+ VertebralPSV cm/s46EDV cm/s13 +---------+--------+--+--------+--+  Left Carotid Findings: +----------+--------+--------+--------+------------------+--------+           PSV cm/sEDV cm/sStenosisPlaque DescriptionComments +----------+--------+--------+--------+------------------+--------+ CCA Prox  133     19                                         +----------+--------+--------+--------+------------------+--------+ CCA Distal113     22                                         +----------+--------+--------+--------+------------------+--------+ ICA Prox  94      34      1-39%   smooth                     +----------+--------+--------+--------+------------------+--------+ ICA Distal65      23                                          +----------+--------+--------+--------+------------------+--------+ ECA       109     15                                         +----------+--------+--------+--------+------------------+--------+ +----------+--------+--------+--------+-------------------+           PSV cm/sEDV cm/sDescribeArm Pressure (mmHG) +----------+--------+--------+--------+-------------------+ Subclavian173     0                                   +----------+--------+--------+--------+-------------------+ +---------+--------+--+--------+--+ VertebralPSV cm/s43EDV cm/s13 +---------+--------+--+--------+--+   Summary: Right Carotid: Velocities in the right ICA are consistent with a 1-39% stenosis. Left Carotid: Velocities in the left ICA are consistent with a 1-39% stenosis. Vertebrals:  Bilateral vertebral arteries demonstrate antegrade flow. Subclavians: Normal flow hemodynamics were seen in bilateral subclavian              arteries. *See table(s) above for measurements and observations.  Electronically signed by Gerarda Fraction on 06/14/2023 at 6:51:19 PM.    Final    MR BRAIN WO CONTRAST Result Date: 06/14/2023 CLINICAL DATA:  Neuro deficit, acute, stroke suspected right sided weakness; Stroke, follow up EXAM: MRI HEAD WITHOUT CONTRAST MRA HEAD WITHOUT CONTRAST TECHNIQUE: Multiplanar, multi-echo pulse sequences of the brain and surrounding structures were acquired without intravenous contrast. Angiographic images of the Circle of Willis were acquired using MRA technique without intravenous contrast. COMPARISON:  Same-day head CT. CTA head and neck angiogram 10/25/2022 FINDINGS: MRI HEAD FINDINGS Brain: Negative for an acute infarct. No hemorrhage. No hydrocephalus. No extra-axial fluid collection. No mass lesion. No mass effect. Vascular: Normal flow voids. Skull and upper cervical spine: Normal marrow signal. Sinuses/Orbits: No middle ear or mastoid effusion. Paranasal  sinuses are notable for mucosal thickening of the floor of bilateral maxillary sinuses. Orbits are unremarkable. Other: None. MRA HEAD FINDINGS Anterior  circulation: No aneurysm. No vascular malformation. No proximal occlusion. There is moderate narrowing in the A1 segment of the right ACA Posterior circulation: No aneurysm. No proximal occlusion. No vascular malformation. Anatomic variants: None IMPRESSION: 1. No acute intracranial abnormality. 2. No intracranial large vessel occlusion. Moderate narrowing in the A1 segment of the right ACA. Electronically Signed   By: Lorenza Cambridge M.D.   On: 06/14/2023 15:53   MR ANGIO HEAD WO CONTRAST Result Date: 06/14/2023 CLINICAL DATA:  Neuro deficit, acute, stroke suspected right sided weakness; Stroke, follow up EXAM: MRI HEAD WITHOUT CONTRAST MRA HEAD WITHOUT CONTRAST TECHNIQUE: Multiplanar, multi-echo pulse sequences of the brain and surrounding structures were acquired without intravenous contrast. Angiographic images of the Circle of Willis were acquired using MRA technique without intravenous contrast. COMPARISON:  Same-day head CT. CTA head and neck angiogram 10/25/2022 FINDINGS: MRI HEAD FINDINGS Brain: Negative for an acute infarct. No hemorrhage. No hydrocephalus. No extra-axial fluid collection. No mass lesion. No mass effect. Vascular: Normal flow voids. Skull and upper cervical spine: Normal marrow signal. Sinuses/Orbits: No middle ear or mastoid effusion. Paranasal sinuses are notable for mucosal thickening of the floor of bilateral maxillary sinuses. Orbits are unremarkable. Other: None. MRA HEAD FINDINGS Anterior circulation: No aneurysm. No vascular malformation. No proximal occlusion. There is moderate narrowing in the A1 segment of the right ACA Posterior circulation: No aneurysm. No proximal occlusion. No vascular malformation. Anatomic variants: None IMPRESSION: 1. No acute intracranial abnormality. 2. No intracranial large vessel occlusion. Moderate  narrowing in the A1 segment of the right ACA. Electronically Signed   By: Lorenza Cambridge M.D.   On: 06/14/2023 15:53      There were no vitals taken for this visit.  Medical Problem List and Plan: 1. 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  -patient may shower  -ELOS/Goals: 7 days  Admit to CIR 2.  Antithrombotics: -DVT/anticoagulation:  Pharmaceutical: Lovenox  -antiplatelet therapy: Plavix 75 mg daily.  Patient is allergic to aspirin 3. Pain Management: Hydrocodone 1 tablet every 4 hours as needed 4. Mood/Behavior/Sleep: Cymbalta 30 mg daily, trazodone 200 mg nightly, Atarax as needed  -antipsychotic agents: N/A 5. Neuropsych/cognition: This patient is capable of making decisions on his own behalf. 6. Skin/Wound Care: Routine skin checks 7. Fluids/Electrolytes/Nutrition: Routine in and outs with follow-up chemistries 8.  Acute/chronic respiratory failure.  Monitor oxygen saturations 9.  OSA.  CPAP 10.  Morbid obesity.  BMI 50.75.  Dietary follow-up 11.  Poorly controlled diabetes mellitus.  Hemoglobin A1c 12.  Semglee 15 units daily, NovoLog 15 units 3 times daily.  Diabetic teaching 12.  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 Farxiga 10 mg daily, Lanoxin 0.125 mg daily, Bumex 2 mg twice daily, Aldactone 25 mg daily.  Monitor for any signs of fluid overload. Daily weights ordered 13.  Hypertension. Continue Toprol-XL 150 mg daily, Minipress 2 mg nightly.  Monitor with increased mobility 14.  Hyperlipidemia.  Continue Crestor 15.  GERD.  Continue Protonix 16.  History of tobacco as well as polysubstance abuse.  Counseling  I have personally performed a face to face diagnostic evaluation, including, but not limited to relevant history and physical exam findings, of this patient and developed relevant assessment and plan.  Additionally,  I have reviewed and concur with the physician assistant's documentation above.  Mcarthur Rossetti Angiulli, PA-C   Horton Chin, MD 06/16/2023

## 2023-06-16 NOTE — Progress Notes (Signed)
PMR Admission Coordinator Pre-Admission Assessment   Patient: Luis [name] is an 52 y.o., male MRN: [id number] DOB: August 02, 1970 Height: 5\' 5"  (165.1 cm) Weight: (!) 138.3 kg                                                                                                                                                  Insurance Information HMO:     PPO:      PCP:      IPA:      80/20:      OTHER:  PRIMARY: [facility]      Policy#:  [id number]   Subscriber: pt CM Name: Luis [name]      Phone#:  [phone]      Fax#: [phone]  Pre-Cert#: [id number]      Employer:  Benefits:  Phone #:      Name:  Luis Hoose. Date:  04/15/20    Deduct:       Out of Pocket Max:       Life Max:   CIR:       SNF:  Outpatient:      Co-Pay:  Home Health:       Co-Pay:  DME:      Co-Pay:  Providers:  SECONDARY: [employer]      Policy#: [id number]       Phone#:    TERTIARY:  [place]icaid [place]  Policy #:  [id number]      Financial Counselor:       Phone#:    The "Data Collection Information Summary" for patients in Inpatient Rehabilitation Facilities with attached "Privacy Act Statement-Health Care Records" was provided and verbally reviewed with: Patient   Emergency Contact Information Contact Information       Name Relation Home Work Mobile    Luis [name] Relative [phone]             Other Contacts   None on File      Current Medical History  Patient Admitting Diagnosis: Right sided weakness, sensory loss  History of Present Illness: Luis [name] is a 52 y.o. male with a history of anxiety, csCHF, cocaine use, DM, OSA/CPAP, prior left CVA who presented to [facility] ED  on 06/12/23 with a 5 day history of right sided weakness and sensory loss. BP elevated upon arrival to 200+ systolic. He reported having had a fall 4 days prior with "mild" head injury. CT of head was negative for acute disease. MRI also without acute findings. UDS positive for cocaine  Neurology feels pt suffered a left sided subcortical infarct. Recommended changing plavix to brillinta for stroke prevention.  P. Was seen by PT/OT and they recommend CIR to assist return to PLOF.   Complete NIHSS TOTAL: 3 Glasgow Coma Scale Score: 15   Patient's medical record from [facility]  has been reviewed by the rehabilitation admission coordinator and physician.   Past Medical History      Past Medical History:  Diagnosis Date   Anxiety     Biventricular ICD (implantable cardioverter-defibrillator) in place     Chronic systolic CHF (congestive heart failure) (HCC)     Cocaine use     Diabetes mellitus without complication (HCC)     GERD (gastroesophageal reflux disease)     HLD (hyperlipidemia)     Hypertension     Morbid obesity (HCC)     NICM (nonischemic cardiomyopathy) (HCC)     OSA (obstructive sleep apnea)     PTSD (post-traumatic stress disorder)      on Depakote   Refusal of blood transfusions as patient is Jehovah's Witness     Tobacco abuse            Has the patient had major surgery during 100 days prior to admission? No   Family History  family history includes Bone cancer in his father; Hypertension in his mother; Lung cancer in his father.     Current Medications   Current Medications    Current Facility-Administered Medications:     stroke: early stages of recovery book, , Does not apply, Once, Tu, Ching T, DO   acetaminophen (TYLENOL) tablet 650 mg, 650 mg, Oral, Q4H PRN **OR** acetaminophen (TYLENOL) 160 MG/5ML solution 650 mg, 650 mg, Per Tube, Q4H PRN **OR** acetaminophen (TYLENOL) suppository 650 mg, 650 mg, Rectal, Q4H PRN, Tu, Ching T, DO   bumetanide (BUMEX) tablet 2 mg, 2 mg, Oral, BID, Tu, Ching T, DO, 2 mg at 06/15/23 0842   carvedilol (COREG) tablet 3.125 mg, 3.125 mg, Oral, BID WC, Tu, Ching T, DO, 3.125 mg at 06/15/23 0843   digoxin (LANOXIN) tablet 0.125 mg, 0.125 mg, Oral, Daily, Tu, Ching T, DO, 0.125 mg at  06/15/23 0843   enoxaparin (LOVENOX) injection 40 mg, 40 mg, Subcutaneous, Q24H, Tu, Ching T, DO, 40 mg at 06/14/23 2225   HYDROcodone-acetaminophen (NORCO/VICODIN) 5-325 MG per tablet 1 tablet, 1 tablet, Oral, Q4H PRN, Opyd, Lavone Neri, MD, 1 tablet at 06/13/23 1842   hydrOXYzine (ATARAX) tablet 25 mg, 25 mg, Oral, TID PRN, Tu, Ching T, DO, 25 mg at 06/15/23 0846   insulin aspart (novoLOG) injection 0-20 Units, 0-20 Units, Subcutaneous, TID PC & HS, Tu, Ching T, DO, 20 Units at 06/15/23 1154   insulin aspart (novoLOG) injection 10 Units, 10 Units, Subcutaneous, TID WC, Rodolph Bong, MD, 10 Units at 06/15/23 1155   insulin glargine-yfgn (SEMGLEE) injection 40 Units, 40 Units, Subcutaneous, QHS, Rodolph Bong, MD   isoniazid (NYDRAZID) tablet 300 mg, 300 mg, Oral, Daily, Rodolph Bong, MD   living well with diabetes book MISC, , Does not apply, Once, Rodolph Bong, MD   morphine (PF) 2 MG/ML injection 1 mg, 1 mg, Intravenous, Q4H PRN, Rodolph Bong, MD   nystatin cream (MYCOSTATIN), , Topical, BID, Jerald Kief, MD, Given at 06/15/23 0844   pantoprazole (PROTONIX) EC tablet 40 mg, 40 mg, Oral, Daily, Tu, Ching T, DO, 40 mg at 06/15/23 0843   potassium chloride SA (KLOR-CON M) CR tablet 20 mEq, 20 mEq, Oral, Daily, Tu, Ching T, DO, 20 mEq at 06/15/23 0843   prazosin (MINIPRESS) capsule 2 mg, 2 mg, Oral, QHS, Rodolph Bong, MD   prochlorperazine (COMPAZINE) injection 10 mg, 10 mg, Intravenous, Q6H PRN, Rodolph Bong, MD   pyridOXINE (VITAMIN B6) tablet 100 mg,  100 mg, Oral, Daily, Rodolph Bong, MD   rosuvastatin (CRESTOR) tablet 40 mg, 40 mg, Oral, Daily, Tu, Ching T, DO, 40 mg at 06/15/23 0843   senna-docusate (Senokot-S) tablet 1 tablet, 1 tablet, Oral, QHS PRN, Tu, Ching T, DO   spironolactone (ALDACTONE) tablet 25 mg, 25 mg, Oral, Daily, Tu, Ching T, DO, 25 mg at 06/15/23 0843   ticagrelor (BRILINTA) tablet 90 mg, 90 mg, Oral, BID, Pearlean Brownie, Pramod S, MD,  90 mg at 06/15/23 0843   traZODone (DESYREL) tablet 300 mg, 300 mg, Oral, QHS PRN, Tu, Ching T, DO, 300 mg at 06/14/23 2224     Patients Current Diet:  Diet Order                  DIET DYS 3 Room service appropriate? Yes with Assist; Fluid consistency: Thin  Diet effective now                         Precautions / Restrictions Precautions Precautions: Fall Precaution Comments: 2.5L O2 baseline Restrictions Weight Bearing Restrictions Per Provider Order: No    Has the patient had 2 or more falls or a fall with injury in the past year?Yes   Prior Activity Level Limited Community (1-2x/wk): pt. went out for appts   Prior Functional Level Prior Function Prior Level of Function : Independent/Modified Independent, Driving Mobility Comments: rollator ADLs Comments: ind; has assist for cleaning   Self Care: Did the patient need help bathing, dressing, using the toilet or eating?  Needed some help   Indoor Mobility: Did the patient need assistance with walking from room to room (with or without device)? Needed some help   Stairs: Did the patient need assistance with internal or external stairs (with or without device)? Needed some help   Functional Cognition: Did the patient need help planning regular tasks such as shopping or remembering to take medications? Needed some help   Patient Information Are you of Hispanic, Latino/a,or Spanish origin?: E. Yes, another Hispanic, Latino, or Spanish origin What is your race?: Z. None of the above Do you need or want an interpreter to communicate with a doctor or health care staff?: 0. No   Patient's Response To:  Health Literacy and Transportation Is the patient able to respond to health literacy and transportation needs?: Yes Health Literacy - How often do you need to have someone help you when you read instructions, pamphlets, or other written material from your doctor or pharmacy?: Never In the past 12 months, has lack of  transportation kept you from medical appointments or from getting medications?: No In the past 12 months, has lack of transportation kept you from meetings, work, or from getting things needed for daily living?: No   Journalist, newspaper / Equipment Home Equipment: Information systems manager, Rollator (4 wheels), Electric scooter   Prior Device Use: Indicate devices/aids used by the patient prior to current illness, exacerbation or injury? Walker   Current Functional Level Cognition   Overall Cognitive Status: Within Functional Limits for tasks assessed Orientation Level: Oriented X4 General Comments: oriented x4, aware of why he is at the hospital and events leading to admission. Recalls rehab admission for last stroke with good implementation of techniques learned in rehab Attention: Sustained, Focused Focused Attention: Impaired Focused Attention Impairment: Verbal basic Sustained Attention: Impaired Sustained Attention Impairment: Verbal basic Memory: Impaired Memory Impairment: Storage deficit, Retrieval deficit, Decreased short term memory, Decreased recall of new information Decreased Short  Term Memory: Verbal basic Awareness: Impaired Awareness Impairment: Intellectual impairment Problem Solving: Impaired Problem Solving Impairment: Verbal basic, Verbal complex Safety/Judgment: Appears intact    Extremity Assessment (includes Sensation/Coordination)   Upper Extremity Assessment: Defer to OT evaluation RUE Deficits / Details: 3/5 globally, decr [facility] RUE Sensation: decreased light touch, decreased proprioception RUE Coordination: decreased fine motor, decreased gross motor  Lower Extremity Assessment: RLE deficits/detail, LLE deficits/detail RLE Deficits / Details: Hip flexion 2/5, knee extension 3-/5, ankle dorsiflexion 2/5 LLE Deficits / Details: Grossly 5/5     ADLs   Overall ADL's : Needs assistance/impaired Eating/Feeding: Set up, Sitting Grooming: Set up, Sitting Upper Body  Bathing: Moderate assistance, Minimal assistance Lower Body Bathing: Maximal assistance Upper Body Dressing : Minimal assistance Lower Body Dressing: Maximal assistance Toilet Transfer: Moderate assistance, +2 for physical assistance Toileting- Clothing Manipulation and Hygiene: Minimal assistance Functional mobility during ADLs: Moderate assistance, +2 for physical assistance, Rolling walker (2 wheels)     Mobility   Overal bed mobility: Needs Assistance Bed Mobility: Sit to Supine Supine to sit: Contact guard Sit to supine: Min assist General bed mobility comments: up in recliner on arrival     Transfers   Overall transfer level: Needs assistance Equipment used: Rolling walker (2 wheels) Transfers: Sit to/from Stand, Bed to chair/wheelchair/BSC Sit to Stand: Contact guard assist Bed to/from chair/wheelchair/BSC transfer type:: Step pivot Step pivot transfers: Min assist General transfer comment: CGA from recliner and standard chair in hall x3 during session     Ambulation / Gait / Stairs / Wheelchair Mobility   Ambulation/Gait Ambulation/Gait assistance: Contact guard assist (chair follow for safety) Gait Distance (Feet): 60 Feet (+ 60' + 120') Assistive device: Rolling walker (2 wheels) Gait Pattern/deviations: Step-to pattern, Decreased step length - right, Decreased dorsiflexion - right General Gait Details: steady gait with RW support, sliding RLE along floor initially, able to minimally clear with increased distance, x2 seated rest breaks due to fatigue Gait velocity: decr     Posture / Balance Balance Overall balance assessment: Needs assistance Sitting-balance support: Feet supported Sitting balance-Leahy Scale: Good Standing balance support: Bilateral upper extremity supported Standing balance-Leahy Scale: Poor Standing balance comment: reliant on external support     Special needs/care consideration Skin intact and Special service needs none        Previous  Home Environment (from acute therapy documentation) Living Arrangements: Alone  Lives With: Alone Available Help at Discharge: Other (Comment) (no) Type of Home: Apartment Home Layout: One level Home Access: Level entry Bathroom Shower/Tub: Health visitor: Standard Bathroom Accessibility: Yes Home Care Services: No Additional Comments: 2.5 L O2 at all times   Discharge Living Setting Plans for Discharge Living Setting: House Type of Home at Discharge: House Discharge Home Layout: One level Discharge Home Access: Level entry Discharge Bathroom Shower/Tub: Walk-in shower Discharge Bathroom Toilet: Standard Discharge Bathroom Accessibility: Yes How Accessible: Accessible via wheelchair, Accessible via walker Does the patient have any problems obtaining your medications?: No   Social/Family/Support Systems Patient Roles: Other (Comment) Anticipated Caregiver: [name] (friend) Anticipated Caregiver's Contact Information: 24/7 Ability/Limitations of Caregiver: min A Discharge Plan Discussed with Primary Caregiver: Yes Is Caregiver In Agreement with Plan?: Yes Does Caregiver/Family have Issues with Lodging/Transportation while Pt is in Rehab?: No     Goals Patient/Family Goal for Rehab: PT/OT Mod I Expected length of stay: 7-10 days Pt/Family Agrees to Admission and willing to participate: Yes Program Orientation Provided & Reviewed with Pt/Caregiver Including Roles  & Responsibilities: Yes  Decrease burden of Care through IP rehab admission: Not anticipated     Possible need for SNF placement upon discharge: Not anticipated     Patient Condition: This patient's medical and functional status has changed since the consult dated: 06/15/23 in which the Rehabilitation Physician determined and documented that the patient's condition is appropriate for intensive rehabilitative care in an inpatient rehabilitation facility. Medical changes are: Pt. Stable for d/c .   Functional changes are: pt. CGA with mobility. After evaluating the patient today and speaking with the Rehabilitation physician and acute team, the patient remains appropriate for inpatient rehab. Will admit to inpatient rehab today.   Preadmission Screen Completed By:  Jeronimo Greaves, CCC-SLP, 06/15/2023 1:13 PM ______________________________________________________________________   Discussed status with Dr. Carlis Abbott on 06/16/23 at 930 and received approval for admission today.   Admission Coordinator:  Jeronimo Greaves, time 1010/Date 06/16/23

## 2023-06-16 NOTE — Progress Notes (Signed)
Occupational Therapy Treatment Patient Details Name: Luis [name] MRN: [id number] DOB: 1971/02/25 Today's Date: 06/16/2023   History of present illness Pt is a 52 y/o M presenting to ED from home with R sided weakness and decr snesation x5 days. Fell and hit head on dresser, blurred visioin. CTH negative. 12/16 MRI without acute  abnormalities PMH includes combined CHF, ICD placement, VT, HTN, OSA on CPAP, PTSD, chronic hypoxic respiratory failure on 2.5L, GERD, anxiety, polysubstance abuse, DM2, morbid obesity   OT comments  Pt. Seen for skilled OT treatment session. Pt. Up in room ambulating from b. Room with RW and no o2 on.  Assisted pt. To sink and he was able to complete standing grooming task with CGA.  Seated rest break afterwards,  education provided on energy conservation strategies.  Pt. Will benefit from continued intense level AIR therapies >3 hrs/day.  Note pt. Has been accepted to AIR with d/c there later today.       If plan is discharge home, recommend the following:  Two people to help with walking and/or transfers;A lot of help with bathing/dressing/bathroom;Assistance with cooking/housework;Direct supervision/assist for medications management;Direct supervision/assist for financial management;Assist for transportation   Equipment Recommendations       Recommendations for Other Services PT consult;Rehab consult    Precautions / Restrictions Precautions Precautions: Fall Precaution Comments: pt. not wearing 02 upon my arrival to room, states "its as needed", encouraged him to put back on and he agreed  Pt. Requests not to be startled when asleep secondary to PTSD       Mobility Bed Mobility               General bed mobility comments: ambulationg out of b.room upon my arrival, seated in recliner at end of session    Transfers Overall transfer level: Needs assistance Equipment used: Rolling walker (2 wheels) Transfers: Sit to/from Stand Sit to  Stand: Contact guard assist     Step pivot transfers: Contact guard assist     General transfer comment: good recall of having legs touch the surface and reaching back prior to sitting down     Balance                                           ADL either performed or assessed with clinical judgement   ADL Overall ADL's : Needs assistance/impaired     Grooming: Wash/dry hands;Contact guard assist;Standing;Sitting Grooming Details (indicate cue type and reason): stood to wash hands, seated rest break after             Lower Body Dressing: Set up;Sit to/from stand Lower Body Dressing Details (indicate cue type and reason): in standing donned R flip flop sandal, L one on foot but he reports he needed help finding the R one (it has slid under his bed) Toilet Transfer: Contact guard assist;Ambulation;Rolling walker (2 wheels) Toilet Transfer Details (indicate cue type and reason): pt. coming out of the b.room upon therapist assts. arrival.  pt. ambulating without o2 but was using rw.         Functional mobility during ADLs: Contact guard assist;Rolling walker (2 wheels) General ADL Comments: education on energy conservation strategies. also reviewed importance of not removing o2 and calling for assistance when ambulating in the room    Extremity/Trunk Assessment  Vision       Perception     Praxis      Cognition Arousal: Alert Behavior During Therapy: WFL for tasks assessed/performed (emotional/sad) Overall Cognitive Status: Within Functional Limits for tasks assessed                                 General Comments: oriented x4, aware of why he is at the hospital and events leading to admission. Recalls rehab admission for last stroke with good implementation of techniques learned in rehab, moments of being tearful, talking about the loss of his wife 3 years ago and they had been together since age 68.         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  Has 7 children with 2 back to back sets of twins.  Wife passed away 3 years ago and they were together since age 18.  He verbalized still struggling with her loss and was tearful when talking about her.  From [place] originally.  Is open about his PTSD and wants to make sure everyone knows not to approach him while sleeping because of his reactions to being startled when asleep.      Pertinent Vitals/ Pain       Pain Assessment Pain Assessment: No/denies pain  Home Living                                          Prior Functioning/Environment              Frequency  Min 1X/week        Progress Toward Goals  OT Goals(current goals can now be found in the care plan section)  Progress towards OT goals: Progressing toward goals     Plan      Co-evaluation                 AM-PAC OT "6 Clicks" Daily Activity     Outcome Measure   Help from another person eating meals?: A Little Help from another person taking care of personal grooming?: A Little Help from another person toileting, which includes using toliet, bedpan, or urinal?: A Little Help from another person bathing (including washing, rinsing, drying)?: A Lot Help from another person to put on and taking off regular upper body clothing?: A Little Help from another person to put on and taking off regular lower body clothing?: A Lot 6 Click Score: 16    End of Session Equipment Utilized During Treatment: Rolling walker (2 wheels);Oxygen  OT Visit Diagnosis: Unsteadiness on feet (R26.81);Other abnormalities of gait and mobility (R26.89);Muscle weakness (generalized) (M62.81);History of falling (Z91.81);Other symptoms and signs involving the nervous system (R29.898)   Activity Tolerance Patient tolerated treatment well   Patient Left in chair;with call bell/phone within reach;with chair  alarm set   Nurse Communication Other (comment) (rn states ok to work with pt. today)        Time: 1610-9604 OT Time Calculation (min): 25 min  Charges: OT General Charges $OT Visit: 1 Visit OT Treatments $Self Care/Home Management : 23-37 mins  Boneta Lucks, COTA/L Acute Rehabilitation [phone]   Alessandra Bevels Lorraine-COTA/L 06/16/2023, 11:39 AM

## 2023-06-16 NOTE — Discharge Summary (Signed)
Physician Discharge Summary  Luis [name] MVH:[id number] DOB: Jun 24, 1971 DOA: 06/12/2023  PCP: [facility]  Admit date: 06/12/2023 Discharge date: 06/16/2023  Time spent: 55 minutes  Recommendations for Outpatient Follow-up:  Follow-up with MD at [facility]. Follow-up with [facility] 2 weeks postdischarge from inpatient rehab.   Discharge Diagnoses:  Principal Problem:   Right sided weakness Active Problems:   Conversion reaction   HLD (hyperlipidemia)   Diabetes (HCC)   Hypertension   Morbid obesity (HCC)   Sleep apnea   Chronic respiratory failure with hypoxia (HCC) - 2.5 L/min   Biventricular ICD (implantable cardioverter-defibrillator) in place   Combined systolic and diastolic heart failure (HCC)   Discharge Condition: Stable and improved.  Diet recommendation: Carb modified diet  Filed Weights   06/12/23 1529  Weight: (!) 138.3 kg    History of present illness:  HPI per Dr Lynetta Mare is a right handed 52 y.o. male with medical history significant of Combined CHF, ICD placement, VT, HTN, OSA on CPAP, chronic hypoxic respiratory failure on 2L, GERD, Anxiety, polysubstance abuse (tobacco, cocaine), T2DM, Morbid obesity who presents with right sided weakness.    Reports a left sided occipital/parietal headache for 3 days and then in past 2 days developed right sided upper and lower extremity weakness. Then today fell because his right leg gave out on him causing him to fall and hit his right forehead on a dresser. Says blood pressure has been high in the SBP 200s and has noted his glucose up to 500s. Also notes numbness to his feet. Has been nauseous but no vomiting. Has low appetite. Reports compliance with his Plavix.    He has a similar episode of right sided weakness in April and had negative CT head x2, MRA head and brain. Only thing positive for UDS for cocaine. There was documented concern for behavioral component conversion  disorder to his exam as right sided weakness was not consistent with every exam. However due to his risk factors he was placed on Plavix.    On arrival to ED, he was afebrile and normotensive on room air.   CBC unremarkable BMP remarkable only for hyperglycemia 421 with no anion gap.   UA was negative.  Chest x-ray with cardiomegaly and slight central congestion. UDS negative EtOH of less than 10   CT head negative.  Hospital Course:  #1 right-sided weakness/conversion reaction -Patient presented with a 3-day history of right-sided weakness prior preceded by left occipital headache. -Concern for acute CVA. 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 -CT head done negative for any acute abnormalities. -Carotid Dopplers done with no significant ICA stenosis. -2D echo done with a EF of 25 to 30%, left ventricular global hypokinesis, grade 1 diastolic dysfunction, mildly reduced right ventricular systolic function.  No atrial level shunt detected by color-flow Doppler. -Patient noted with ICD and as such MRI brain and MRA head obtained on 06/14/2023 with no acute intracranial abnormality, no intracranial large vessel occlusion, moderate narrowing in the A1 segment of the right ACA. -Neurology consulted and patient changed from clopidogrel to Brilinta 90 mg twice daily per neurology recommendations while MRI was pending. -Per neurology likely conversion reaction and recommending continuation of Plavix for stroke prophylaxis. -Brilinta was discontinued and patient placed on Plavix 75 mg daily.   -PT  OT has assessed patient recommending inpatient rehab when bed available.   2.  Chronic respiratory failure with hypoxia on 2.5 L/min -Stable.     3 sleep apnea -Patient maintained on CPAP nightly.   4.  Morbid obesity -BMI  50.75 kg/m -Lifestyle modification. -Outpatient follow-up with PCP.   5.  Poorly controlled diabetes mellitus -Hemoglobin A1c 12 (06/13/2023) -Patient placed on long-acting Semglee as well as NovoLog meal coverage and dose adjusted such that by day of discharge Semglee was increased to 50 units daily and NovoLog meal coverage increased to 15 units 3 times daily. -Home regimen Marcelline Deist was resumed. -Diabetes coordinator following who notes that patient takes Lantus 70 units nightly at home and NovoLog 25 units 3 times daily with meal coverage as well as Farxiga 10 mg daily and Trulicity 1.5 mg weekly. -It was noted by diabetic coordinator that patient had been out of his Trulicity for several weeks. -Will need close outpatient follow-up with PCP postdischarge from inpatient rehab.   6.  Hyperlipidemia -Patient maintained on home regimen Crestor.    7.  Chronic combined systolic and diastolic CHF/status post ICD -Repeat 2D echo with ith a EF of 25 to 30%, left ventricular global hypokinesis, grade 1 diastolic dysfunction, mildly reduced right ventricular systolic function.  No atrial level shunt detected by color-flow Doppler. -Patient maintained on home regimen Bumex, digoxin, Coreg, spironolactone, Crestor,    Procedures: 2D echo 08/14/2022 Carotid Dopplers 06/14/2023 CT head 06/12/2023 MRI brain 06/14/2023 MRA angiogram head 06/14/2023  Consultations: Neurology: Dr. Otelia Limes 06/13/2023   Discharge Exam: Vitals:   06/16/23 1150 06/16/23 1612  BP: 138/76 (!) 154/80  Pulse: 92 77  Resp: 18 19  Temp: 99.1 F (37.3 C) 98.6 F (37 C)  SpO2: 96% 96%    General: NAD Cardiovascular: RRR no murmurs rubs or gallops.  No JVD.  No lower extremity edema. Respiratory: Clear to auscultation bilaterally.  No wheezes, no crackles, no rhonchi.  Fair air movement.  Speaking in full sentences.  Discharge Instructions     Allergies  Allergen Reactions   Aspirin Anaphylaxis and Other  (See Comments)    Throat closing    Iodine-131 Hives   Peanut (Diagnostic) Anaphylaxis    Throat closes   Iodinated Contrast Media Hives    Can be premedicated    Latex Hives and Rash   Penicillins Nausea And Vomiting   Ultram [Tramadol] Hives and Nausea Only   Zestril [Lisinopril] Cough   Cinnamon    Amoxicillin-Pot Clavulanate Diarrhea   Lidocaine Rash    Rash from adhesive on lidocaine patches     Follow-up Information     [facility] Schedule an appointment as soon as possible for a visit in 2 week(s).   Contact information: [address] [facility] [place] [address] [phone]                  The results of significant diagnostics from this hospitalization (including imaging, microbiology, ancillary and laboratory) are listed below for reference.    Significant Diagnostic Studies: VAS US CAROTID Result Date: 06/14/2023 Carotid Arterial Duplex Study Patient Name:  Luis [name]  Date of Exam:   06/14/2023 Medical Rec #: [id number]              Accession #:    [id number] Date of Birth: 02/22/1971               Patient Gender: M Patient Age:   63 years Exam Location:  [facility] Procedure:      VAS US CAROTID Referring Phys: Elmer Picker --------------------------------------------------------------------------------  Indications:       CVA. Risk Factors:      Hypertension, hyperlipidemia, Diabetes, current smoker, prior                    CVA. Comparison Study:  None Performing Technologist: Shona Simpson  Examination Guidelines: A complete evaluation includes B-mode imaging, spectral Doppler, color Doppler, and power Doppler as needed of all accessible portions of each vessel. Bilateral testing is considered an integral part of a complete examination. Limited examinations for reoccurring indications may be performed as noted.  Right Carotid Findings: +----------+--------+--------+--------+------------------+--------+            PSV cm/sEDV cm/sStenosisPlaque DescriptionComments +----------+--------+--------+--------+------------------+--------+ CCA Prox  106     18                                         +----------+--------+--------+--------+------------------+--------+ CCA Distal97      18                                         +----------+--------+--------+--------+------------------+--------+ ICA Prox  80      19      1-39%   hypoechoic                 +----------+--------+--------+--------+------------------+--------+ ICA Distal48      17                                         +----------+--------+--------+--------+------------------+--------+ ECA       112     16                                         +----------+--------+--------+--------+------------------+--------+ +----------+--------+-------+--------+-------------------+           PSV cm/sEDV cmsDescribeArm Pressure (mmHG) +----------+--------+-------+--------+-------------------+ [id number]     0                                  +----------+--------+-------+--------+-------------------+ +---------+--------+--+--------+--+ VertebralPSV cm/s46EDV cm/s13 +---------+--------+--+--------+--+  Left Carotid Findings: +----------+--------+--------+--------+------------------+--------+           PSV cm/sEDV cm/sStenosisPlaque DescriptionComments +----------+--------+--------+--------+------------------+--------+ CCA Prox  133     19                                         +----------+--------+--------+--------+------------------+--------+ CCA Distal113     22                                         +----------+--------+--------+--------+------------------+--------+ ICA Prox  94      34      1-39%   smooth                     +----------+--------+--------+--------+------------------+--------+ ICA Distal65      23                                          +----------+--------+--------+--------+------------------+--------+  ECA       109     15                                         +----------+--------+--------+--------+------------------+--------+ +----------+--------+--------+--------+-------------------+           PSV cm/sEDV cm/sDescribeArm Pressure (mmHG) +----------+--------+--------+--------+-------------------+ Subclavian173     0                                   +----------+--------+--------+--------+-------------------+ +---------+--------+--+--------+--+ VertebralPSV cm/s43EDV cm/s13 +---------+--------+--+--------+--+   Summary: Right Carotid: Velocities in the right ICA are consistent with a 1-39% stenosis. Left Carotid: Velocities in the left ICA are consistent with a 1-39% stenosis. Vertebrals:  Bilateral vertebral arteries demonstrate antegrade flow. Subclavians: Normal flow hemodynamics were seen in bilateral subclavian              arteries. *See table(s) above for measurements and observations.  Electronically signed by Gerarda Fraction on 06/14/2023 at 6:51:19 PM.    Final    MR BRAIN WO CONTRAST Result Date: 06/14/2023 CLINICAL DATA:  Neuro deficit, acute, stroke suspected right sided weakness; Stroke, follow up EXAM: MRI HEAD WITHOUT CONTRAST MRA HEAD WITHOUT CONTRAST TECHNIQUE: Multiplanar, multi-echo pulse sequences of the brain and surrounding structures were acquired without intravenous contrast. Angiographic images of the Circle of Willis were acquired using MRA technique without intravenous contrast. COMPARISON:  Same-day head CT. CTA head and neck angiogram 10/25/2022 FINDINGS: MRI HEAD FINDINGS Brain: Negative for an acute infarct. No hemorrhage. No hydrocephalus. No extra-axial fluid collection. No mass lesion. No mass effect. Vascular: Normal flow voids. Skull and upper cervical spine: Normal marrow signal. Sinuses/Orbits: No middle ear or mastoid effusion. Paranasal sinuses are notable for mucosal thickening  of the floor of bilateral maxillary sinuses. Orbits are unremarkable. Other: None. MRA HEAD FINDINGS Anterior circulation: No aneurysm. No vascular malformation. No proximal occlusion. There is moderate narrowing in the A1 segment of the right ACA Posterior circulation: No aneurysm. No proximal occlusion. No vascular malformation. Anatomic variants: None IMPRESSION: 1. No acute intracranial abnormality. 2. No intracranial large vessel occlusion. Moderate narrowing in the A1 segment of the right ACA. Electronically Signed   By: Lorenza Cambridge M.D.   On: 06/14/2023 15:53   MR ANGIO HEAD WO CONTRAST Result Date: 06/14/2023 CLINICAL DATA:  Neuro deficit, acute, stroke suspected right sided weakness; Stroke, follow up EXAM: MRI HEAD WITHOUT CONTRAST MRA HEAD WITHOUT CONTRAST TECHNIQUE: Multiplanar, multi-echo pulse sequences of the brain and surrounding structures were acquired without intravenous contrast. Angiographic images of the Circle of Willis were acquired using MRA technique without intravenous contrast. COMPARISON:  Same-day head CT. CTA head and neck angiogram 10/25/2022 FINDINGS: MRI HEAD FINDINGS Brain: Negative for an acute infarct. No hemorrhage. No hydrocephalus. No extra-axial fluid collection. No mass lesion. No mass effect. Vascular: Normal flow voids. Skull and upper cervical spine: Normal marrow signal. Sinuses/Orbits: No middle ear or mastoid effusion. Paranasal sinuses are notable for mucosal thickening of the floor of bilateral maxillary sinuses. Orbits are unremarkable. Other: None. MRA HEAD FINDINGS Anterior circulation: No aneurysm. No vascular malformation. No proximal occlusion. There is moderate narrowing in the A1 segment of the right ACA Posterior circulation: No aneurysm. No proximal occlusion. No vascular malformation. Anatomic variants: None IMPRESSION: 1. No acute intracranial abnormality. 2. No intracranial large vessel occlusion. Moderate narrowing  in the A1 segment of the right  ACA. Electronically Signed   By: Lorenza Cambridge M.D.   On: 06/14/2023 15:53   ECHOCARDIOGRAM COMPLETE Result Date: 06/14/2023    ECHOCARDIOGRAM REPORT   Patient Name:   Luis [name] Date of Exam: 06/14/2023 Medical Rec #:  [id number]             Height:       65.0 in Accession #:    [id number]            Weight:       305.0 lb Date of Birth:  February 05, 1971              BSA:          2.367 m Patient Age:    52 years              BP:           140/78 mmHg Patient Gender: M                     HR:           87 bpm. Exam Location:  Inpatient Procedure: 2D Echo, Cardiac Doppler and Color Doppler Indications:    stroke  History:        Patient has prior history of Echocardiogram examinations, most                 recent 02/27/2022. CHF, Defibrillator and Pacemaker; Risk                 Factors:Dyslipidemia, Hypertension and Diabetes.  Sonographer:    Webb Laws Referring Phys: [id number] PRAMOD S SETHI IMPRESSIONS  1. Left ventricular ejection fraction, by estimation, is 25 to 30%. The left ventricle has normal function. The left ventricle demonstrates global hypokinesis. Left ventricular diastolic parameters are consistent with Grade I diastolic dysfunction (impaired relaxation).  2. Right ventricular systolic function is mildly reduced. The right ventricular size is normal. Tricuspid regurgitation signal is inadequate for assessing PA pressure.  3. The mitral valve is normal in structure. No evidence of mitral valve regurgitation.  4. The aortic valve is tricuspid. Aortic valve regurgitation is not visualized. No aortic stenosis is present.  5. The inferior vena cava is normal in size with greater than 50% respiratory variability, suggesting right atrial pressure of 3 mmHg. FINDINGS  Left Ventricle: Left ventricular ejection fraction, by estimation, is 25 to 30%. The left ventricle has normal function. The left ventricle demonstrates global hypokinesis. Definity contrast agent was given IV to delineate the left  ventricular endocardial borders. The left ventricular internal cavity size was normal in size. There is no left ventricular hypertrophy. Left ventricular diastolic parameters are consistent with Grade I diastolic dysfunction (impaired relaxation). Right Ventricle: The right ventricular size is normal. No increase in right ventricular wall thickness. Right ventricular systolic function is mildly reduced. Tricuspid regurgitation signal is inadequate for assessing PA pressure. Left Atrium: Left atrial size was normal in size. Right Atrium: Right atrial size was normal in size. Pericardium: There is no evidence of pericardial effusion. Mitral Valve: The mitral valve is normal in structure. No evidence of mitral valve regurgitation. Tricuspid Valve: The tricuspid valve is normal in structure. Tricuspid valve regurgitation is not demonstrated. Aortic Valve: The aortic valve is tricuspid. Aortic valve regurgitation is not visualized. No aortic stenosis is present. Aortic valve mean gradient measures 2.0 mmHg. Aortic valve peak gradient measures 3.7 mmHg. Pulmonic Valve: The pulmonic valve  was normal in structure. Pulmonic valve regurgitation is not visualized. Aorta: The aortic root is normal in size and structure. Venous: The inferior vena cava is normal in size with greater than 50% respiratory variability, suggesting right atrial pressure of 3 mmHg. IAS/Shunts: No atrial level shunt detected by color flow Doppler. Additional Comments: A device lead is visualized in the right ventricle.  LEFT VENTRICLE PLAX 2D LVIDd:         5.00 cm      Diastology LVIDs:         3.90 cm      LV e' medial:   6.53 cm/s LV PW:         1.10 cm      LV E/e' medial: 12.8 LV IVS:        0.90 cm  LV Volumes (MOD) LV vol d, MOD A2C: 148.0 ml LV vol d, MOD A4C: 203.0 ml LV vol s, MOD A2C: 54.6 ml LV vol s, MOD A4C: 87.2 ml LV SV MOD A2C:     93.4 ml LV SV MOD A4C:     203.0 ml LV SV MOD BP:      105.5 ml RIGHT VENTRICLE RV Basal diam:  3.70 cm RV  S prime:     13.70 cm/s TAPSE (M-mode): 1.8 cm LEFT ATRIUM             Index        RIGHT ATRIUM          Index LA diam:        4.00 cm 1.69 cm/m   RA Area:     9.39 cm LA Vol (A2C):   37.7 ml 15.93 ml/m  RA Volume:   15.90 ml 6.72 ml/m LA Vol (A4C):   20.7 ml 8.75 ml/m LA Biplane Vol: 27.9 ml 11.79 ml/m  AORTIC VALVE AV Vmax:      96.20 cm/s AV Vmean:     66.900 cm/s AV VTI:       0.159 m AV Peak Grad: 3.7 mmHg AV Mean Grad: 2.0 mmHg  AORTA Ao Root diam: 2.70 cm Ao Asc diam:  3.20 cm MITRAL VALVE MV Area (PHT): 5.13 cm MV Decel Time: 148 msec MV E velocity: 83.80 cm/s MV A velocity: 99.00 cm/s MV E/A ratio:  0.85 Dalton McleanMD Electronically signed by Wilfred Lacy Signature Date/Time: 06/14/2023/12:22:35 PM    Final    DG Chest Port 1 View Result Date: 06/12/2023 CLINICAL DATA:  Shortness of breath EXAM: PORTABLE CHEST 1 VIEW COMPARISON:  12/07/2022 FINDINGS: Left-sided multi lead pacing device as before. Low lung volumes. Cardiomegaly with central broncho vascular crowding but suspected slight central congestion. No pleural effusion or pneumothorax IMPRESSION: Low lung volumes with cardiomegaly and suspected slight central congestion. Electronically Signed   By: Jasmine Pang M.D.   On: 06/12/2023 18:04   CT Head Wo Contrast Result Date: 06/12/2023 CLINICAL DATA:  Headache, new onset (Age >= 51y) Headache, increasing frequency or severity R sided weakness EXAM: CT HEAD WITHOUT CONTRAST TECHNIQUE: Contiguous axial images were obtained from the base of the skull through the vertex without intravenous contrast. RADIATION DOSE REDUCTION: This exam was performed according to the departmental dose-optimization program which includes automated exposure control, adjustment of the mA and/or kV according to patient size and/or use of iterative reconstruction technique. COMPARISON:  CT head April 29, 24. FINDINGS: Brain: No evidence of acute infarction, hemorrhage, hydrocephalus, extra-axial collection or  mass lesion/mass effect. Vascular: No hyperdense vessel or unexpected  calcification. Skull: No acute fracture. Sinuses/Orbits: Left maxillary sinus mucosal thickening. No acute orbital findings. Other: Chronic nonspecific soft tissue thickening the suboccipital region. IMPRESSION: No evidence of acute intracranial abnormality. Electronically Signed   By: Feliberto Harts M.D.   On: 06/12/2023 17:19    Microbiology: No results found for this or any previous visit (from the past 240 hours).   Labs: Basic Metabolic Panel: Recent Labs  Lab 06/12/23 1539 06/14/23 0813 06/15/23 0515 06/16/23 1137  NA 137 135 133* 136  K 4.5 4.2 3.6 5.2*  CL 99 99 101 103  CO2 24 26 24  19*  GLUCOSE 421* 337* 287* 277*  BUN 10 11 15 18   CREATININE 0.86 0.95 0.86 1.47*  CALCIUM 9.5 8.8* 8.7* 9.2   Liver Function Tests: Recent Labs  Lab 06/12/23 1539 06/14/23 0813  AST 19 20  ALT 18 19  ALKPHOS 67 48  BILITOT 0.5 0.5  PROT 6.4* 6.1*  ALBUMIN 3.2* 3.1*   No results for input(s): "LIPASE", "AMYLASE" in the last 168 hours. No results for input(s): "AMMONIA" in the last 168 hours. CBC: Recent Labs  Lab 06/12/23 1539 06/14/23 0813 06/15/23 0515  WBC 7.5 7.4 7.9  NEUTROABS 5.0  --   --   HGB 13.6 13.7 12.9*  HCT 41.9 42.2 39.4  MCV 88.0 87.9 87.6  PLT 237 245 235   Cardiac Enzymes: No results for input(s): "CKTOTAL", "CKMB", "CKMBINDEX", "TROPONINI" in the last 168 hours. BNP: BNP (last 3 results) Recent Labs    06/12/23 1715  BNP 118.4*    ProBNP (last 3 results) No results for input(s): "PROBNP" in the last 8760 hours.  CBG: Recent Labs  Lab 06/15/23 1649 06/15/23 2106 06/16/23 0620 06/16/23 1152 06/16/23 1613  GLUCAP 301* 265* 300* 269* 352*       Signed:  Ramiro Harvest MD.  [facility] 06/16/2023, 6:58 PM

## 2023-06-16 NOTE — TOC Transition Note (Signed)
Transition of Care [facility]) - Discharge Note   Patient Details  Name: Luis [name] MRN: [id number] Date of Birth: 1971-03-31  Transition of Care [facility]) CM/SW Contact:  Kermit Balo, RN Phone Number: 06/16/2023, 12:27 PM   Clinical Narrative:     Pt is discharging to CIR today. CM signing off.    Final next level of care: IP Rehab Facility Barriers to Discharge: No Barriers Identified   Patient Goals and CMS Choice   CMS Medicare.gov Compare Post Acute Care list provided to:: Patient Choice offered to / list presented to : Patient      Discharge Placement                       Discharge Plan and Services Additional resources added to the After Visit Summary for     Discharge Planning Services: CM Consult Post Acute Care Choice: IP Rehab                               Social Drivers of Health (SDOH) Interventions SDOH Screenings   Food Insecurity: No Food Insecurity (06/13/2023)  Housing: Low Risk  (06/13/2023)  Transportation Needs: Unmet Transportation Needs (06/13/2023)  Utilities: Not At Risk (06/13/2023)  Alcohol Screen: Low Risk  (12/04/2022)  Depression (PHQ2-9): Medium Risk (11/16/2022)  Financial Resource Strain: High Risk (11/16/2022)  Social Connections: Unknown (08/26/2022)   Received from [facility]  Stress: No Stress Concern Present (04/25/2023)   Received from [facility]  Tobacco Use: High Risk (06/12/2023)     Readmission Risk Interventions    01/31/2021   12:26 PM 01/31/2021    9:25 AM  Readmission Risk Prevention Plan  Transportation Screening Complete   PCP or Specialist Appt within 3-5 Days Complete Complete  HRI or Home Care Consult Complete Complete  Social Work Consult for Recovery Care Planning/Counseling Patient refused Complete  Palliative Care Screening Not Applicable Not Applicable  Medication Review Oceanographer) Complete Referral to Pharmacy

## 2023-06-16 NOTE — Progress Notes (Signed)
Inpatient Rehab Admissions Coordinator:    I will admit Pt. To CIR today. RN may call report to [phone].  Pt. To d/c to CIR for an estimated 5-7 days with the goal of reaching mod I and discharging home alone or with assistance of his friend, [name].   Megan Salon, MS, CCC-SLP Rehab Admissions Coordinator  [phone] (celll) [phone] (office)

## 2023-06-16 NOTE — Inpatient Diabetes Management (Signed)
Inpatient Diabetes Program Recommendations  AACE/ADA: New Consensus Statement on Inpatient Glycemic Control (2015)  Target Ranges:  Prepandial:   less than 140 mg/dL      Peak postprandial:   less than 180 mg/dL (1-2 hours)      Critically ill patients:  140 - 180 mg/dL   Lab Results  Component Value Date   GLUCAP 300 (H) 06/16/2023   HGBA1C 12.0 (H) 06/13/2023    Latest Reference Range & Units 06/15/23 07:18 06/15/23 11:25 06/15/23 16:49 06/15/23 21:06 06/16/23 06:20  Glucose-Capillary 70 - 99 mg/dL 161 (H) 096 (H) 045 (H) 265 (H) 300 (H)  (H): Data is abnormally high  Review of Glycemic Control  Diabetes history: DM2 Outpatient Diabetes medications: Lantus 70 units nightly Novolog 25 units tid meal coverage Farxiga 10 mg daily Trulicity 1.5 mg weekly (off x 3 months due to being on back order) Current orders for Inpatient glycemic control: Semglee 40 units nightly, Novolog 10 units tid meal coverage, Novolog 0-20 units qid correction  Inpatient Diabetes Program Recommendations:   Please consider: -Increase Semglee to 50 units daily -Increase Novolog to 15 units tid meal coverage  Thank you, [name] E. [name], RN, MSN, CDCES  Diabetes Coordinator Inpatient Glycemic Control Team Team Pager [phone] (8am-5pm) 06/16/2023 9:04 AM

## 2023-06-16 NOTE — H&P (Incomplete)
Physical Medicine and Rehabilitation Admission H&P    Chief Complaint  Patient presents with   Weakness  : HPI: Luis [name] is a 52 year old right-handed male Jehovah witness with history of biventricular ICD implantable device/chronic systolic congestive heart failure/nonischemic cardiomyopathy, diabetes mellitus, hyperlipidemia, chronic hypoxic respiratory failure on 2 L oxygen, hypertension, morbid obesity with BMI of 50.75, PTSD, tobacco as well as polysubstance abuse.  Review patient lives alone.  1 level apartment.  Independent with ADLs and uses a rollator for mobility.  Presented 06/12/2023 with left-sided headache x 3 days as well as right side weakness x 2 days.  He reported a fall 4 days prior question mild head injury.  Noted systolic blood pressures in the 200s and glucose in the 500s.  CT/MRI showed no acute intracranial abnormality.  MRA showed no intracranial large vessel occlusion.  Admission chemistries unremarkable except glucose 421, latest urine drug screen negative, BNP 119, troponin negative, hemoglobin A1c 12.0.  Echocardiogram ejection fraction of 25 to 30%.  Left ventricle demonstrating global hypokinesis.  Carotid Dopplers with 1-39% stenosis.  Neurology follow-up strokelike episode with right hemiparesis hemisensory loss likely due to suspect left subcortical infarct/conversion reaction.  Currently maintained on Plavix for CVA prophylaxis no aspirin due to allergy.  Lovenox added for DVT prophylaxis.  Presently on mechanical soft diet.  Therapy evaluations completed due to patient's decreased functional mobility was admitted for a comprehensive rehab program.  Review of Systems  Constitutional:  Positive for malaise/fatigue.  HENT:  Negative for hearing loss.   Eyes:  Positive for blurred vision.  Respiratory:  Negative for cough, shortness of breath and wheezing.   Cardiovascular:  Positive for leg swelling. Negative for chest pain and palpitations.   Gastrointestinal:  Positive for constipation. Negative for heartburn, nausea and vomiting.       GERD  Genitourinary:  Negative for dysuria, flank pain and hematuria.  Musculoskeletal:  Positive for falls, joint pain and myalgias.  Skin:  Negative for rash.  Neurological:  Positive for dizziness, sensory change, focal weakness and headaches. Negative for weakness.  Psychiatric/Behavioral:         Anxiety/PTSD  All other systems reviewed and are negative.  Past Medical History:  Diagnosis Date   Anxiety    Biventricular ICD (implantable cardioverter-defibrillator) in place    Chronic systolic CHF (congestive heart failure) (HCC)    Cocaine use    Diabetes mellitus without complication (HCC)    GERD (gastroesophageal reflux disease)    HLD (hyperlipidemia)    Hypertension    Morbid obesity (HCC)    NICM (nonischemic cardiomyopathy) (HCC)    OSA (obstructive sleep apnea)    PTSD (post-traumatic stress disorder)    on Depakote   Refusal of blood transfusions as patient is Jehovah's Witness    Tobacco abuse    Past Surgical History:  Procedure Laterality Date   BIV ICD INSERTION CRT-D     FOOT FRACTURE SURGERY     ICD GENERATOR CHANGEOUT N/A 03/23/2022   Procedure: ICD GENERATOR CHANGEOUT;  Surgeon: Nelly Laurence, Roberts Gaudy, MD;  Location: [facility] INVASIVE CV LAB;  Service: Cardiovascular;  Laterality: N/A;   RIGHT HEART CATH N/A 03/06/2022   Procedure: RIGHT HEART CATH;  Surgeon: Orbie Pyo, MD;  Location: [facility] INVASIVE CV LAB;  Service: Cardiovascular;  Laterality: N/A;   RIGHT/LEFT HEART CATH AND CORONARY ANGIOGRAPHY N/A 04/16/2022   Procedure: RIGHT/LEFT HEART CATH AND CORONARY ANGIOGRAPHY;  Surgeon: Laurey Morale, MD;  Location: [facility] INVASIVE CV LAB;  Service: Cardiovascular;  Laterality: N/A;   ROTATOR CUFF REPAIR     TONSILLECTOMY     Family History  Problem Relation Age of Onset   Hypertension Mother    Bone cancer Father    Lung cancer Father    Social History:  reports  that he has been smoking cigarettes. He started smoking about 16 years ago. He has a 7.5 pack-year smoking history. He has never used smokeless tobacco. He reports that he does not currently use alcohol. He reports current drug use. Drugs: Cocaine and Marijuana. Allergies:  Allergies  Allergen Reactions   Aspirin Anaphylaxis and Other (See Comments)    Throat closing    Iodine-131 Hives   Peanut (Diagnostic) Anaphylaxis    Throat closes   Iodinated Contrast Media Hives    Can be premedicated    Latex Hives and Rash   Penicillins Nausea And Vomiting   Ultram [Tramadol] Hives and Nausea Only   Zestril [Lisinopril] Cough   Cinnamon    Amoxicillin-Pot Clavulanate Diarrhea   Lidocaine Rash    Rash from adhesive on lidocaine patches    Medications Prior to Admission  Medication Sig Dispense Refill   acetaminophen (TYLENOL) 325 MG tablet Take 2 tablets (650 mg total) by mouth every 6 (six) hours as needed for mild pain.     bumetanide (BUMEX) 2 MG tablet Take 2 mg by mouth 2 (two) times daily.     clopidogrel (PLAVIX) 75 MG tablet Take 1 tablet (75 mg total) by mouth daily. 30 tablet 0   dapagliflozin propanediol (FARXIGA) 10 MG TABS tablet Take 1 tablet (10 mg total) by mouth daily. 90 tablet 2   digoxin (LANOXIN) 0.125 MG tablet Take 0.125 mg by mouth daily.     hydrOXYzine (ATARAX) 25 MG tablet Take 1 tablet (25 mg total) by mouth 3 (three) times daily as needed for anxiety. 75 tablet 1   Insulin Aspart FlexPen (NOVOLOG) 100 UNIT/ML Inject 25 Units into the skin 3 (three) times daily before meals.     insulin glargine (LANTUS) 100 UNIT/ML injection Inject 70 Units into the skin at bedtime.     isoniazid (NYDRAZID) 300 MG tablet Take 1 tablet by mouth daily.     metoprolol succinate (TOPROL-XL) 50 MG 24 hr tablet Take 150 mg by mouth daily. Take with or immediately following a meal.     pantoprazole (PROTONIX) 40 MG tablet Take 40 mg by mouth daily.     potassium chloride SA (KLOR-CON M)  20 MEQ tablet Take 20 mEq by mouth daily.     prazosin (MINIPRESS) 2 MG capsule Take 1 capsule (2 mg total) by mouth at bedtime. 30 capsule 1   rosuvastatin (CRESTOR) 40 MG tablet Take 1 tablet (40 mg total) by mouth daily. (Patient taking differently: Take 20 mg by mouth daily.) 30 tablet 0   sacubitril-valsartan (ENTRESTO) 24-26 MG Take 1 tablet by mouth 2 (two) times daily.     spironolactone (ALDACTONE) 25 MG tablet Take 1 tablet (25 mg total) by mouth daily.     traZODone (DESYREL) 300 MG tablet Take 1 tablet (300 mg total) by mouth at bedtime as needed for sleep. (Patient taking differently: Take 300 mg by mouth at bedtime.) 30 tablet 0   DULoxetine (CYMBALTA) 30 MG capsule Take 3 capsules (90 mg total) by mouth daily. 90 capsule 0      Home: Home Living Family/patient expects to be discharged to:: Private residence Living Arrangements: Alone Available Help at Discharge: Other (  Comment) (no) Type of Home: Apartment Home Access: Level entry Home Layout: One level Bathroom Shower/Tub: Health visitor: Standard Bathroom Accessibility: Yes Home Equipment: Information systems manager, Rollator (4 wheels), Electric scooter Additional Comments: 2.5 L O2 at all times  Lives With: Alone   Functional History: Prior Function Prior Level of Function : Independent/Modified Independent, Driving Mobility Comments: rollator ADLs Comments: ind; has assist for cleaning  Functional Status:  Mobility: Bed Mobility Overal bed mobility: Needs Assistance Bed Mobility: Sit to Supine Supine to sit: Contact guard Sit to supine: Min assist General bed mobility comments: up in recliner on arrival Transfers Overall transfer level: Needs assistance Equipment used: Rolling walker (2 wheels) Transfers: Sit to/from Stand, Bed to chair/wheelchair/BSC Sit to Stand: Contact guard assist Bed to/from chair/wheelchair/BSC transfer type:: Step pivot Step pivot transfers: Min assist General transfer  comment: CGA from recliner and standard chair in hall x3 during session Ambulation/Gait Ambulation/Gait assistance: Contact guard assist (chair follow for safety) Gait Distance (Feet): 60 Feet (+ 60' + 120') Assistive device: Rolling walker (2 wheels) Gait Pattern/deviations: Step-to pattern, Decreased step length - right, Decreased dorsiflexion - right General Gait Details: steady gait with RW support, sliding RLE along floor initially, able to minimally clear with increased distance, x2 seated rest breaks due to fatigue Gait velocity: decr    ADL: ADL Overall ADL's : Needs assistance/impaired Eating/Feeding: Set up, Sitting Grooming: Set up Grooming Details (indicate cue type and reason): shaves sitting/standing at sink Upper Body Bathing: Moderate assistance, Minimal assistance Lower Body Bathing: Maximal assistance Upper Body Dressing : Minimal assistance Lower Body Dressing: Maximal assistance Toilet Transfer: Contact guard assist, Ambulation, Rolling walker (2 wheels) Toileting- Clothing Manipulation and Hygiene: Minimal assistance Functional mobility during ADLs: Contact guard assist, Rolling walker (2 wheels)  Cognition: Cognition Overall Cognitive Status: Within Functional Limits for tasks assessed Orientation Level: Oriented X4 Attention: Sustained, Focused Focused Attention: Impaired Focused Attention Impairment: Verbal basic Sustained Attention: Impaired Sustained Attention Impairment: Verbal basic Memory: Impaired Memory Impairment: Storage deficit, Retrieval deficit, Decreased short term memory, Decreased recall of new information Decreased Short Term Memory: Verbal basic Awareness: Impaired Awareness Impairment: Intellectual impairment Problem Solving: Impaired Problem Solving Impairment: Verbal basic, Verbal complex Safety/Judgment: Appears intact Cognition Arousal: Alert Behavior During Therapy: WFL for tasks assessed/performed Overall Cognitive Status:  Within Functional Limits for tasks assessed General Comments: oriented x4, aware of why he is at the hospital and events leading to admission. Recalls rehab admission for last stroke with good implementation of techniques learned in rehab  Physical Exam: Blood pressure (!) 123/53, pulse 97, temperature 98.7 F (37.1 C), temperature source Oral, resp. rate 18, height 5\' 5"  (1.651 m), weight (!) 138.3 kg, SpO2 95%. Physical Exam Neurological:     Comments: Patient is awake alert no acute distress.  Follows commands.  Normal insight and awareness.     Results for orders placed or performed during the hospital encounter of 06/12/23 (from the past 48 hours)  Glucose, capillary     Status: Abnormal   Collection Time: 06/14/23 11:35 AM  Result Value Ref Range   Glucose-Capillary 365 (H) 70 - 99 mg/dL    Comment: Glucose reference range applies only to samples taken after fasting for at least 8 hours.  Glucose, capillary     Status: Abnormal   Collection Time: 06/14/23  4:27 PM  Result Value Ref Range   Glucose-Capillary 313 (H) 70 - 99 mg/dL    Comment: Glucose reference range applies only to samples taken after  fasting for at least 8 hours.   Comment 1 Notify RN   Glucose, capillary     Status: Abnormal   Collection Time: 06/14/23  9:26 PM  Result Value Ref Range   Glucose-Capillary 315 (H) 70 - 99 mg/dL    Comment: Glucose reference range applies only to samples taken after fasting for at least 8 hours.  CBC     Status: Abnormal   Collection Time: 06/15/23  5:15 AM  Result Value Ref Range   WBC 7.9 4.0 - 10.5 K/uL   RBC 4.50 4.22 - 5.81 MIL/uL   Hemoglobin 12.9 (L) 13.0 - 17.0 g/dL   HCT 10.2 72.5 - 36.6 %   MCV 87.6 80.0 - 100.0 fL   MCH 28.7 26.0 - 34.0 pg   MCHC 32.7 30.0 - 36.0 g/dL   RDW 44.0 34.7 - 42.5 %   Platelets 235 150 - 400 K/uL   nRBC 0.0 0.0 - 0.2 %    Comment: Performed at [facility], [address] [place], [place] [address]  Basic metabolic panel      Status: Abnormal   Collection Time: 06/15/23  5:15 AM  Result Value Ref Range   Sodium 133 (L) 135 - 145 mmol/L   Potassium 3.6 3.5 - 5.1 mmol/L   Chloride 101 98 - 111 mmol/L   CO2 24 22 - 32 mmol/L   Glucose, Bld 287 (H) 70 - 99 mg/dL    Comment: Glucose reference range applies only to samples taken after fasting for at least 8 hours.   BUN 15 6 - 20 mg/dL   Creatinine, Ser 7.56 0.61 - 1.24 mg/dL   Calcium 8.7 (L) 8.9 - 10.3 mg/dL   GFR, Estimated >43 >32 mL/min    Comment: (NOTE) Calculated using the CKD-EPI Creatinine Equation (2021)    Anion gap 8 5 - 15    Comment: Performed at [facility], [address] [place], [place] [address]  Glucose, capillary     Status: Abnormal   Collection Time: 06/15/23  7:18 AM  Result Value Ref Range   Glucose-Capillary 277 (H) 70 - 99 mg/dL    Comment: Glucose reference range applies only to samples taken after fasting for at least 8 hours.  Glucose, capillary     Status: Abnormal   Collection Time: 06/15/23 11:25 AM  Result Value Ref Range   Glucose-Capillary 360 (H) 70 - 99 mg/dL    Comment: Glucose reference range applies only to samples taken after fasting for at least 8 hours.  Glucose, capillary     Status: Abnormal   Collection Time: 06/15/23  4:49 PM  Result Value Ref Range   Glucose-Capillary 301 (H) 70 - 99 mg/dL    Comment: Glucose reference range applies only to samples taken after fasting for at least 8 hours.   Comment 1 Notify RN   Glucose, capillary     Status: Abnormal   Collection Time: 06/15/23  9:06 PM  Result Value Ref Range   Glucose-Capillary 265 (H) 70 - 99 mg/dL    Comment: Glucose reference range applies only to samples taken after fasting for at least 8 hours.  Glucose, capillary     Status: Abnormal   Collection Time: 06/16/23  6:20 AM  Result Value Ref Range   Glucose-Capillary 300 (H) 70 - 99 mg/dL    Comment: Glucose reference range applies only to samples taken after fasting for at least 8 hours.    VAS US  CAROTID Result Date: 06/14/2023 Carotid Arterial Duplex Study Patient Name:  Luis [name]  Date of Exam:   06/14/2023 Medical Rec #: [id number]              Accession #:    [id number] Date of Birth: [birth year]               Patient Gender: M Patient Age:   1 years Exam Location:  [facility] Procedure:      VAS US CAROTID Referring Phys: Elmer Picker --------------------------------------------------------------------------------  Indications:       CVA. Risk Factors:      Hypertension, hyperlipidemia, Diabetes, current smoker, prior                    CVA. Comparison Study:  None Performing Technologist: Shona Simpson  Examination Guidelines: A complete evaluation includes B-mode imaging, spectral Doppler, color Doppler, and power Doppler as needed of all accessible portions of each vessel. Bilateral testing is considered an integral part of a complete examination. Limited examinations for reoccurring indications may be performed as noted.  Right Carotid Findings: +----------+--------+--------+--------+------------------+--------+           PSV cm/sEDV cm/sStenosisPlaque DescriptionComments +----------+--------+--------+--------+------------------+--------+ CCA Prox  106     18                                         +----------+--------+--------+--------+------------------+--------+ CCA Distal97      18                                         +----------+--------+--------+--------+------------------+--------+ ICA Prox  80      19      1-39%   hypoechoic                 +----------+--------+--------+--------+------------------+--------+ ICA Distal48      17                                         +----------+--------+--------+--------+------------------+--------+ ECA       112     16                                         +----------+--------+--------+--------+------------------+--------+  +----------+--------+-------+--------+-------------------+           PSV cm/sEDV cmsDescribeArm Pressure (mmHG) +----------+--------+-------+--------+-------------------+ [id number]     0                                  +----------+--------+-------+--------+-------------------+ +---------+--------+--+--------+--+ VertebralPSV cm/s46EDV cm/s13 +---------+--------+--+--------+--+  Left Carotid Findings: +----------+--------+--------+--------+------------------+--------+           PSV cm/sEDV cm/sStenosisPlaque DescriptionComments +----------+--------+--------+--------+------------------+--------+ CCA Prox  133     19                                         +----------+--------+--------+--------+------------------+--------+ CCA Distal113     22                                         +----------+--------+--------+--------+------------------+--------+  ICA Prox  94      34      1-39%   smooth                     +----------+--------+--------+--------+------------------+--------+ ICA Distal65      23                                         +----------+--------+--------+--------+------------------+--------+ ECA       109     15                                         +----------+--------+--------+--------+------------------+--------+ +----------+--------+--------+--------+-------------------+           PSV cm/sEDV cm/sDescribeArm Pressure (mmHG) +----------+--------+--------+--------+-------------------+ Subclavian173     0                                   +----------+--------+--------+--------+-------------------+ +---------+--------+--+--------+--+ VertebralPSV cm/s43EDV cm/s13 +---------+--------+--+--------+--+   Summary: Right Carotid: Velocities in the right ICA are consistent with a 1-39% stenosis. Left Carotid: Velocities in the left ICA are consistent with a 1-39% stenosis. Vertebrals:  Bilateral vertebral arteries demonstrate  antegrade flow. Subclavians: Normal flow hemodynamics were seen in bilateral subclavian              arteries. *See table(s) above for measurements and observations.  Electronically signed by Gerarda Fraction on 06/14/2023 at 6:51:19 PM.    Final    MR BRAIN WO CONTRAST Result Date: 06/14/2023 CLINICAL DATA:  Neuro deficit, acute, stroke suspected right sided weakness; Stroke, follow up EXAM: MRI HEAD WITHOUT CONTRAST MRA HEAD WITHOUT CONTRAST TECHNIQUE: Multiplanar, multi-echo pulse sequences of the brain and surrounding structures were acquired without intravenous contrast. Angiographic images of the Circle of Willis were acquired using MRA technique without intravenous contrast. COMPARISON:  Same-day head CT. CTA head and neck angiogram 10/25/2022 FINDINGS: MRI HEAD FINDINGS Brain: Negative for an acute infarct. No hemorrhage. No hydrocephalus. No extra-axial fluid collection. No mass lesion. No mass effect. Vascular: Normal flow voids. Skull and upper cervical spine: Normal marrow signal. Sinuses/Orbits: No middle ear or mastoid effusion. Paranasal sinuses are notable for mucosal thickening of the floor of bilateral maxillary sinuses. Orbits are unremarkable. Other: None. MRA HEAD FINDINGS Anterior circulation: No aneurysm. No vascular malformation. No proximal occlusion. There is moderate narrowing in the A1 segment of the right ACA Posterior circulation: No aneurysm. No proximal occlusion. No vascular malformation. Anatomic variants: None IMPRESSION: 1. No acute intracranial abnormality. 2. No intracranial large vessel occlusion. Moderate narrowing in the A1 segment of the right ACA. Electronically Signed   By: Lorenza Cambridge M.D.   On: 06/14/2023 15:53   MR ANGIO HEAD WO CONTRAST Result Date: 06/14/2023 CLINICAL DATA:  Neuro deficit, acute, stroke suspected right sided weakness; Stroke, follow up EXAM: MRI HEAD WITHOUT CONTRAST MRA HEAD WITHOUT CONTRAST TECHNIQUE: Multiplanar, multi-echo pulse sequences  of the brain and surrounding structures were acquired without intravenous contrast. Angiographic images of the Circle of Willis were acquired using MRA technique without intravenous contrast. COMPARISON:  Same-day head CT. CTA head and neck angiogram 10/25/2022 FINDINGS: MRI HEAD FINDINGS Brain: Negative for an acute infarct. No hemorrhage. No hydrocephalus. No extra-axial fluid collection. No mass lesion. No mass effect.  Vascular: Normal flow voids. Skull and upper cervical spine: Normal marrow signal. Sinuses/Orbits: No middle ear or mastoid effusion. Paranasal sinuses are notable for mucosal thickening of the floor of bilateral maxillary sinuses. Orbits are unremarkable. Other: None. MRA HEAD FINDINGS Anterior circulation: No aneurysm. No vascular malformation. No proximal occlusion. There is moderate narrowing in the A1 segment of the right ACA Posterior circulation: No aneurysm. No proximal occlusion. No vascular malformation. Anatomic variants: None IMPRESSION: 1. No acute intracranial abnormality. 2. No intracranial large vessel occlusion. Moderate narrowing in the A1 segment of the right ACA. Electronically Signed   By: Lorenza Cambridge M.D.   On: 06/14/2023 15:53   ECHOCARDIOGRAM COMPLETE Result Date: 06/14/2023    ECHOCARDIOGRAM REPORT   Patient Name:   Luis [name] Date of Exam: 06/14/2023 Medical Rec #:  [id number]             Height:       65.0 in Accession #:    [id number]            Weight:       305.0 lb Date of Birth:  1970/12/31              BSA:          2.367 m Patient Age:    52 years              BP:           140/78 mmHg Patient Gender: M                     HR:           87 bpm. Exam Location:  Inpatient Procedure: 2D Echo, Cardiac Doppler and Color Doppler Indications:    stroke  History:        Patient has prior history of Echocardiogram examinations, most                 recent 02/27/2022. CHF, Defibrillator and Pacemaker; Risk                 Factors:Dyslipidemia, Hypertension and  Diabetes.  Sonographer:    Webb Laws Referring Phys: [id number] PRAMOD S SETHI IMPRESSIONS  1. Left ventricular ejection fraction, by estimation, is 25 to 30%. The left ventricle has normal function. The left ventricle demonstrates global hypokinesis. Left ventricular diastolic parameters are consistent with Grade I diastolic dysfunction (impaired relaxation).  2. Right ventricular systolic function is mildly reduced. The right ventricular size is normal. Tricuspid regurgitation signal is inadequate for assessing PA pressure.  3. The mitral valve is normal in structure. No evidence of mitral valve regurgitation.  4. The aortic valve is tricuspid. Aortic valve regurgitation is not visualized. No aortic stenosis is present.  5. The inferior vena cava is normal in size with greater than 50% respiratory variability, suggesting right atrial pressure of 3 mmHg. FINDINGS  Left Ventricle: Left ventricular ejection fraction, by estimation, is 25 to 30%. The left ventricle has normal function. The left ventricle demonstrates global hypokinesis. Definity contrast agent was given IV to delineate the left ventricular endocardial borders. The left ventricular internal cavity size was normal in size. There is no left ventricular hypertrophy. Left ventricular diastolic parameters are consistent with Grade I diastolic dysfunction (impaired relaxation). Right Ventricle: The right ventricular size is normal. No increase in right ventricular wall thickness. Right ventricular systolic function is mildly reduced. Tricuspid regurgitation signal is inadequate for assessing PA pressure. Left Atrium: Left atrial  size was normal in size. Right Atrium: Right atrial size was normal in size. Pericardium: There is no evidence of pericardial effusion. Mitral Valve: The mitral valve is normal in structure. No evidence of mitral valve regurgitation. Tricuspid Valve: The tricuspid valve is normal in structure. Tricuspid valve regurgitation is not  demonstrated. Aortic Valve: The aortic valve is tricuspid. Aortic valve regurgitation is not visualized. No aortic stenosis is present. Aortic valve mean gradient measures 2.0 mmHg. Aortic valve peak gradient measures 3.7 mmHg. Pulmonic Valve: The pulmonic valve was normal in structure. Pulmonic valve regurgitation is not visualized. Aorta: The aortic root is normal in size and structure. Venous: The inferior vena cava is normal in size with greater than 50% respiratory variability, suggesting right atrial pressure of 3 mmHg. IAS/Shunts: No atrial level shunt detected by color flow Doppler. Additional Comments: A device lead is visualized in the right ventricle.  LEFT VENTRICLE PLAX 2D LVIDd:         5.00 cm      Diastology LVIDs:         3.90 cm      LV e' medial:   6.53 cm/s LV PW:         1.10 cm      LV E/e' medial: 12.8 LV IVS:        0.90 cm  LV Volumes (MOD) LV vol d, MOD A2C: 148.0 ml LV vol d, MOD A4C: 203.0 ml LV vol s, MOD A2C: 54.6 ml LV vol s, MOD A4C: 87.2 ml LV SV MOD A2C:     93.4 ml LV SV MOD A4C:     203.0 ml LV SV MOD BP:      105.5 ml RIGHT VENTRICLE RV Basal diam:  3.70 cm RV S prime:     13.70 cm/s TAPSE (M-mode): 1.8 cm LEFT ATRIUM             Index        RIGHT ATRIUM          Index LA diam:        4.00 cm 1.69 cm/m   RA Area:     9.39 cm LA Vol (A2C):   37.7 ml 15.93 ml/m  RA Volume:   15.90 ml 6.72 ml/m LA Vol (A4C):   20.7 ml 8.75 ml/m LA Biplane Vol: 27.9 ml 11.79 ml/m  AORTIC VALVE AV Vmax:      96.20 cm/s AV Vmean:     66.900 cm/s AV VTI:       0.159 m AV Peak Grad: 3.7 mmHg AV Mean Grad: 2.0 mmHg  AORTA Ao Root diam: 2.70 cm Ao Asc diam:  3.20 cm MITRAL VALVE MV Area (PHT): 5.13 cm MV Decel Time: 148 msec MV E velocity: 83.80 cm/s MV A velocity: 99.00 cm/s MV E/A ratio:  0.85 Dalton McleanMD Electronically signed by Wilfred Lacy Signature Date/Time: 06/14/2023/12:22:35 PM    Final       Blood pressure (!) 123/53, pulse 97, temperature 98.7 F (37.1 C), temperature  source Oral, resp. rate 18, height 5\' 5"  (1.651 m), weight (!) 138.3 kg, SpO2 95%.  Medical Problem List and Plan: 1. 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  -patient may *** shower  -ELOS/Goals: *** 2.  Antithrombotics: -DVT/anticoagulation:  Pharmaceutical: Lovenox  -antiplatelet therapy: Plavix 75 mg daily.  Patient is allergic to aspirin 3. Pain Management: Hydrocodone 1 tablet every 4 hours as  needed 4. Mood/Behavior/Sleep: Cymbalta 30 mg daily, trazodone 200 mg nightly, Atarax as needed  -antipsychotic agents: N/A 5. Neuropsych/cognition: This patient is capable of making decisions on his own behalf. 6. Skin/Wound Care: Routine skin checks 7. Fluids/Electrolytes/Nutrition: Routine in and outs with follow-up chemistries 8.  Acute/chronic respiratory failure.  Monitor oxygen saturations 9.  OSA.  CPAP 10.  Morbid obesity.  BMI 50.75.  Dietary follow-up 11.  Poorly controlled diabetes mellitus.  Hemoglobin A1c 12.  Semglee 15 units daily, NovoLog 15 units 3 times daily.  Diabetic teaching 12.  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 Farxiga 10 mg daily, Lanoxin 0.125 mg daily, Bumex 2 mg twice daily, Aldactone 25 mg daily.  Monitor for any signs of fluid overload 13.  Hypertension.  Toprol-XL 150 mg daily, Minipress 2 mg nightly.  Monitor with increased mobility 14.  Hyperlipidemia.  Crestor 15.  GERD.  Protonix 16.  History of tobacco as well as polysubstance abuse.  Counseling ***  Charlton Amor, PA-C 06/16/2023

## 2023-06-17 DIAGNOSIS — I639 Cerebral infarction, unspecified: Secondary | ICD-10-CM | POA: Diagnosis not present

## 2023-06-17 DIAGNOSIS — J9610 Chronic respiratory failure, unspecified whether with hypoxia or hypercapnia: Secondary | ICD-10-CM | POA: Diagnosis not present

## 2023-06-17 DIAGNOSIS — I5042 Chronic combined systolic (congestive) and diastolic (congestive) heart failure: Secondary | ICD-10-CM | POA: Diagnosis not present

## 2023-06-17 LAB — GLUCOSE, CAPILLARY
Glucose-Capillary: 209 mg/dL — ABNORMAL HIGH (ref 70–99)
Glucose-Capillary: 329 mg/dL — ABNORMAL HIGH (ref 70–99)
Glucose-Capillary: 259 mg/dL — ABNORMAL HIGH (ref 70–99)
Glucose-Capillary: 327 mg/dL — ABNORMAL HIGH (ref 70–99)

## 2023-06-17 NOTE — Plan of Care (Signed)
  Problem: RH Swallowing Goal: LTG Patient will consume least restrictive diet using compensatory strategies with assistance (SLP) Description: LTG:  Patient will consume least restrictive diet using compensatory strategies with assistance (SLP) Flowsheets (Taken 06/17/2023 1453) LTG: Pt Patient will consume least restrictive diet using compensatory strategies with assistance of (SLP): Modified Independent   Problem: RH Problem Solving Goal: LTG Patient will demonstrate problem solving for (SLP) Description: LTG:  Patient will demonstrate problem solving for basic/complex daily situations with cues  (SLP) Flowsheets (Taken 06/17/2023 1453) LTG: Patient will demonstrate problem solving for (SLP): (mildly complex) Other (comment) LTG Patient will demonstrate problem solving for: Supervision   Problem: RH Memory Goal: LTG Patient will use memory compensatory aids to (SLP) Description: LTG:  Patient will use memory compensatory aids to recall biographical/new, daily complex information with cues (SLP) Flowsheets (Taken 06/17/2023 1453) LTG: Patient will use memory compensatory aids to (SLP): Supervision

## 2023-06-17 NOTE — Plan of Care (Signed)
  Problem: RH Balance Goal: LTG Patient will maintain dynamic sitting balance (PT) Description: LTG:  Patient will maintain dynamic sitting balance with assistance during mobility activities (PT) Flowsheets (Taken 06/17/2023 1534) LTG: Pt will maintain dynamic sitting balance during mobility activities with:: Independent with assistive device  Goal: LTG Patient will maintain dynamic standing balance (PT) Description: LTG:  Patient will maintain dynamic standing balance with assistance during mobility activities (PT) Flowsheets (Taken 06/17/2023 1534) LTG: Pt will maintain dynamic standing balance during mobility activities with:: Independent with assistive device    Problem: Sit to Stand Goal: LTG:  Patient will perform sit to stand with assistance level (PT) Description: LTG:  Patient will perform sit to stand with assistance level (PT) Flowsheets (Taken 06/17/2023 1534) LTG: PT will perform sit to stand in preparation for functional mobility with assistance level: Independent with assistive device   Problem: RH Bed Mobility Goal: LTG Patient will perform bed mobility with assist (PT) Description: LTG: Patient will perform bed mobility with assistance, with/without cues (PT). Flowsheets (Taken 06/17/2023 1534) LTG: Pt will perform bed mobility with assistance level of: Independent with assistive device    Problem: RH Bed to Chair Transfers Goal: LTG Patient will perform bed/chair transfers w/assist (PT) Description: LTG: Patient will perform bed to chair transfers with assistance (PT). Flowsheets (Taken 06/17/2023 1534) LTG: Pt will perform Bed to Chair Transfers with assistance level: Independent with assistive device    Problem: RH Car Transfers Goal: LTG Patient will perform car transfers with assist (PT) Description: LTG: Patient will perform car transfers with assistance (PT). Flowsheets (Taken 06/17/2023 1534) LTG: Pt will perform car transfers with assist:: Supervision/Verbal  cueing   Problem: RH Ambulation Goal: LTG Patient will ambulate in controlled environment (PT) Description: LTG: Patient will ambulate in a controlled environment, # of feet with assistance (PT). Flowsheets (Taken 06/17/2023 1534) LTG: Pt will ambulate in controlled environ  assist needed:: Supervision/Verbal cueing LTG: Ambulation distance in controlled environment: 130ft using LRAD Goal: LTG Patient will ambulate in home environment (PT) Description: LTG: Patient will ambulate in home environment, # of feet with assistance (PT). Flowsheets (Taken 06/17/2023 1534) LTG: Pt will ambulate in home environ  assist needed:: Independent with assistive device LTG: Ambulation distance in home environment: 71ft using LRAD

## 2023-06-17 NOTE — Discharge Instructions (Addendum)
Inpatient Rehab Discharge Instructions  Luis [name] Discharge date and time: No discharge date for patient encounter.   Activities/Precautions/ Functional Status: Activity: activity as tolerated Diet: diabetic diet Wound Care: Routine skin checks Functional status:  ___ No restrictions     ___ Walk up steps independently ___ 24/7 supervision/assistance   ___ Walk up steps with assistance ___ Intermittent supervision/assistance  ___ Bathe/dress independently ___ Walk with walker     _x__ Bathe/dress with assistance ___ Walk Independently    ___ Shower independently ___ Walk with assistance    ___ Shower with assistance ___ No alcohol     ___ Return to work/school ________  Special Instructions: No driving smoking or alcohol  Follow-up with [facility] for ongoing medical care   Continue oxygen therapy as prior to admission.  COMMUNITY REFERRALS UPON DISCHARGE:    Home Health:   PT     OT     ST     SNA      RN                Agency:[facility]    Phone:[phone] *Please expect follow-up within 2-3 days to schedule your appointment. If you have not received follow-up, be sure to contact the branch directly.*      STROKE/TIA DISCHARGE INSTRUCTIONS SMOKING Cigarette smoking nearly doubles your risk of having a stroke & is the single most alterable risk factor  If you smoke or have smoked in the last 12 months, you are advised to quit smoking for your health. Most of the excess cardiovascular risk related to smoking disappears within a year of stopping. Ask you doctor about anti-smoking medications [place] Quit Line: 1-800-QUIT NOW Free Smoking Cessation Classes [phone]  CHOLESTEROL Know your levels; limit fat & cholesterol in your diet  Lipid Panel     Component Value Date/Time   CHOL 160 06/13/2023 0443   TRIG 211 (H) 06/13/2023 0443   HDL 46 06/13/2023 0443   CHOLHDL 3.5 06/13/2023 0443   VLDL 42 (H) 06/13/2023 0443   LDLCALC  72 06/13/2023 0443     Many patients benefit from treatment even if their cholesterol is at goal. Goal: Total Cholesterol (CHOL) less than 160 Goal:  Triglycerides (TRIG) less than 150 Goal:  HDL greater than 40 Goal:  LDL (LDLCALC) less than 100   BLOOD PRESSURE American Stroke Association blood pressure target is less that 120/80 mm/Hg  Your discharge blood pressure is:  BP: 131/73 Monitor your blood pressure Limit your salt and alcohol intake Many individuals will require more than one medication for high blood pressure  DIABETES (A1c is a blood sugar average for last 3 months) Goal HGBA1c is under 7% (HBGA1c is blood sugar average for last 3 months)  Diabetes:   Lab Results  Component Value Date   HGBA1C 12.0 (H) 06/13/2023    Your HGBA1c can be lowered with medications, healthy diet, and exercise. Check your blood sugar as directed by your physician Call your physician if you experience unexplained or low blood sugars.  PHYSICAL ACTIVITY/REHABILITATION Goal is 30 minutes at least 4 days per week  Activity: Increase activity slowly, Therapies: Physical Therapy: Home Health Return to work:  Activity decreases your risk of heart attack and stroke and makes your heart stronger.  It helps control your weight and blood pressure; helps you relax and can improve your mood. Participate in a regular exercise program. Talk with your doctor about the best form of exercise for you (  dancing, walking, swimming, cycling).  DIET/WEIGHT Goal is to maintain a healthy weight  Your discharge diet is:  Diet Order             DIET DYS 3 Room service appropriate? Yes with Assist; Fluid consistency: Thin  Diet effective now                   liquids Your height is:  Height: 5\' 6"  (167.6 cm) Your current weight is: Weight: (!) 141.7 kg Your Body Mass Index (BMI) is:  BMI (Calculated): 50.45 Following the type of diet specifically designed for you will help prevent another stroke. Your goal  weight range is:   Your goal Body Mass Index (BMI) is 19-24. Healthy food habits can help reduce 3 risk factors for stroke:  High cholesterol, hypertension, and excess weight.  RESOURCES Stroke/Support Group:  Call [phone]   STROKE EDUCATION PROVIDED/REVIEWED AND GIVEN TO PATIENT Stroke warning signs and symptoms How to activate emergency medical system (call 911). Medications prescribed at discharge. Need for follow-up after discharge. Personal risk factors for stroke. Pneumonia vaccine given: No Flu vaccine given: No My questions have been answered, the writing is legible, and I understand these instructions.  I will adhere to these goals & educational materials that have been provided to me after my discharge from the hospital.      My questions have been answered and I understand these instructions. I will adhere to these goals and the provided educational materials after my discharge from the hospital.  Patient/Caregiver Signature _______________________________ Date __________  Clinician Signature _______________________________________ Date __________  Please bring this form and your medication list with you to all your follow-up doctor's appointments.

## 2023-06-17 NOTE — Discharge Summary (Signed)
 Physician Discharge Summary  Patient ID: Luis [name] MRN: [id number] DOB/AGE: Aug 03, 1970 52 y.o.  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 EXAM: CHEST - 2 VIEW COMPARISON:  Chest x-ray 06/12/2023. FINDINGS: Lung volumes are normal. No consolidative airspace disease. No pleural effusions. No pneumothorax. No evidence of pulmonary edema. Heart size is mildly enlarged. Upper mediastinal contours are within normal limits. Left-sided biventricular pacemaker/AICD noted with lead tips projecting over the expected location of the right atrium, right ventricle and overlying the lateral wall the left ventricle via the coronary sinus and coronary veins. IMPRESSION: 1. No radiographic evidence of acute cardiopulmonary disease. 2. Cardiomegaly. Electronically Signed   By: Toribio Aye M.D.   On: 06/27/2023 12:17   VAS US  CAROTID Result Date: 06/14/2023 Carotid Arterial Duplex Study Patient Name:  Luis [name]  Date of Exam:   06/14/2023 Medical Rec #: [id number]              Accession #:    [id number] Date of Birth: 06-May-1971               Patient Gender: M Patient Age:   19 years Exam Location:  [facility] Procedure:      VAS US  CAROTID Referring Phys: DEVON SHAFER --------------------------------------------------------------------------------  Indications:       CVA. Risk Factors:      Hypertension, hyperlipidemia, Diabetes, current smoker, prior                    CVA. Comparison Study:  None  Performing Technologist: Garnette Rockers  Examination Guidelines: A complete evaluation includes B-mode imaging, spectral Doppler, color Doppler, and power Doppler as needed of all accessible portions of each vessel. Bilateral testing is considered an integral part of a complete examination. Limited examinations for reoccurring indications may be performed as noted.  Right Carotid Findings: +----------+--------+--------+--------+------------------+--------+           PSV cm/sEDV cm/sStenosisPlaque DescriptionComments +----------+--------+--------+--------+------------------+--------+ CCA Prox  106     18                                         +----------+--------+--------+--------+------------------+--------+ CCA Distal97      18                                         +----------+--------+--------+--------+------------------+--------+ ICA Prox  80      19      1-39%   hypoechoic                 +----------+--------+--------+--------+------------------+--------+ ICA Distal48      17                                         +----------+--------+--------+--------+------------------+--------+ ECA       112     16                                         +----------+--------+--------+--------+------------------+--------+ +----------+--------+-------+--------+-------------------+  PSV cm/sEDV cmsDescribeArm Pressure (mmHG) +----------+--------+-------+--------+-------------------+ [id number]     0                                  +----------+--------+-------+--------+-------------------+ +---------+--------+--+--------+--+ VertebralPSV cm/s46EDV cm/s13 +---------+--------+--+--------+--+  Left Carotid Findings: +----------+--------+--------+--------+------------------+--------+           PSV cm/sEDV cm/sStenosisPlaque DescriptionComments +----------+--------+--------+--------+------------------+--------+ CCA Prox  133     19                                          +----------+--------+--------+--------+------------------+--------+ CCA Distal113     22                                         +----------+--------+--------+--------+------------------+--------+ ICA Prox  94      34      1-39%   smooth                     +----------+--------+--------+--------+------------------+--------+ ICA Distal65      23                                         +----------+--------+--------+--------+------------------+--------+ ECA       109     15                                         +----------+--------+--------+--------+------------------+--------+ +----------+--------+--------+--------+-------------------+           PSV cm/sEDV cm/sDescribeArm Pressure (mmHG) +----------+--------+--------+--------+-------------------+ Subclavian173     0                                   +----------+--------+--------+--------+-------------------+ +---------+--------+--+--------+--+ VertebralPSV cm/s43EDV cm/s13 +---------+--------+--+--------+--+   Summary: Right Carotid: Velocities in the right ICA are consistent with a 1-39% stenosis. Left Carotid: Velocities in the left ICA are consistent with a 1-39% stenosis. Vertebrals:  Bilateral vertebral arteries demonstrate antegrade flow. Subclavians: Normal flow hemodynamics were seen in bilateral subclavian              arteries. *See table(s) above for measurements and observations.  Electronically signed by Fonda Rim on 06/14/2023 at 6:51:19 PM.    Final    MR BRAIN WO CONTRAST Result Date: 06/14/2023 CLINICAL DATA:  Neuro deficit, acute, stroke suspected right sided weakness; Stroke, follow up EXAM: MRI HEAD WITHOUT CONTRAST MRA HEAD WITHOUT CONTRAST TECHNIQUE: Multiplanar, multi-echo pulse sequences of the brain and surrounding structures were acquired without intravenous contrast. Angiographic images of the Circle of Willis were acquired using MRA technique without  intravenous contrast. COMPARISON:  Same-day head CT. CTA head and neck angiogram 10/25/2022 FINDINGS: MRI HEAD FINDINGS Brain: Negative for an acute infarct. No hemorrhage. No hydrocephalus. No extra-axial fluid collection. No mass lesion. No mass effect. Vascular: Normal flow voids. Skull and upper cervical spine: Normal marrow signal. Sinuses/Orbits: No middle ear or mastoid effusion. Paranasal sinuses are notable for mucosal thickening of the floor of bilateral maxillary sinuses. Orbits are unremarkable. Other: None. MRA HEAD FINDINGS Anterior  circulation: No aneurysm. No vascular malformation. No proximal occlusion. There is moderate narrowing in the A1 segment of the right ACA Posterior circulation: No aneurysm. No proximal occlusion. No vascular malformation. Anatomic variants: None IMPRESSION: 1. No acute intracranial abnormality. 2. No intracranial large vessel occlusion. Moderate narrowing in the A1 segment of the right ACA. Electronically Signed   By: Lyndall Gore M.D.   On: 06/14/2023 15:53   MR ANGIO HEAD WO CONTRAST Result Date: 06/14/2023 CLINICAL DATA:  Neuro deficit, acute, stroke suspected right sided weakness; Stroke, follow up EXAM: MRI HEAD WITHOUT CONTRAST MRA HEAD WITHOUT CONTRAST TECHNIQUE: Multiplanar, multi-echo pulse sequences of the brain and surrounding structures were acquired without intravenous contrast. Angiographic images of the Circle of Willis were acquired using MRA technique without intravenous contrast. COMPARISON:  Same-day head CT. CTA head and neck angiogram 10/25/2022 FINDINGS: MRI HEAD FINDINGS Brain: Negative for an acute infarct. No hemorrhage. No hydrocephalus. No extra-axial fluid collection. No mass lesion. No mass effect. Vascular: Normal flow voids. Skull and upper cervical spine: Normal marrow signal. Sinuses/Orbits: No middle ear or mastoid effusion. Paranasal sinuses are notable for mucosal thickening of the floor of bilateral maxillary sinuses. Orbits are  unremarkable. Other: None. MRA HEAD FINDINGS Anterior circulation: No aneurysm. No vascular malformation. No proximal occlusion. There is moderate narrowing in the A1 segment of the right ACA Posterior circulation: No aneurysm. No proximal occlusion. No vascular malformation. Anatomic variants: None IMPRESSION: 1. No acute intracranial abnormality. 2. No intracranial large vessel occlusion. Moderate narrowing in the A1 segment of the right ACA. Electronically Signed   By: Lyndall Gore M.D.   On: 06/14/2023 15:53   ECHOCARDIOGRAM COMPLETE Result Date: 06/14/2023    ECHOCARDIOGRAM REPORT   Patient Name:   DEWAYNE SEVERE Date of Exam: 06/14/2023 Medical Rec #:  [id number]             Height:       65.0 in Accession #:    [id number]            Weight:       305.0 lb Date of Birth:  09-27-1970              BSA:          2.367 m Patient Age:    52 years              BP:           140/78 mmHg Patient Gender: M                     HR:           87 bpm. Exam Location:  Inpatient Procedure: 2D Echo, Cardiac Doppler and Color Doppler Indications:    stroke  History:        Patient has prior history of Echocardiogram examinations, most                 recent 02/27/2022. CHF, Defibrillator and Pacemaker; Risk                 Factors:Dyslipidemia, Hypertension and Diabetes.  Sonographer:    Lanell Maduro Referring Phys: [id number] PRAMOD S SETHI IMPRESSIONS  1. Left ventricular ejection fraction, by estimation, is 25 to 30%. The left ventricle has normal function. The left ventricle demonstrates global hypokinesis. Left ventricular diastolic parameters are consistent with Grade I diastolic dysfunction (impaired relaxation).  2. Right ventricular systolic function is mildly reduced. The right ventricular size is  normal. Tricuspid regurgitation signal is inadequate for assessing PA pressure.  3. The mitral valve is normal in structure. No evidence of mitral valve regurgitation.  4. The aortic valve is tricuspid. Aortic valve  regurgitation is not visualized. No aortic stenosis is present.  5. The inferior vena cava is normal in size with greater than 50% respiratory variability, suggesting right atrial pressure of 3 mmHg. FINDINGS  Left Ventricle: Left ventricular ejection fraction, by estimation, is 25 to 30%. The left ventricle has normal function. The left ventricle demonstrates global hypokinesis. Definity  contrast agent was given IV to delineate the left ventricular endocardial borders. The left ventricular internal cavity size was normal in size. There is no left ventricular hypertrophy. Left ventricular diastolic parameters are consistent with Grade I diastolic dysfunction (impaired relaxation). Right Ventricle: The right ventricular size is normal. No increase in right ventricular wall thickness. Right ventricular systolic function is mildly reduced. Tricuspid regurgitation signal is inadequate for assessing PA pressure. Left Atrium: Left atrial size was normal in size. Right Atrium: Right atrial size was normal in size. Pericardium: There is no evidence of pericardial effusion. Mitral Valve: The mitral valve is normal in structure. No evidence of mitral valve regurgitation. Tricuspid Valve: The tricuspid valve is normal in structure. Tricuspid valve regurgitation is not demonstrated. Aortic Valve: The aortic valve is tricuspid. Aortic valve regurgitation is not visualized. No aortic stenosis is present. Aortic valve mean gradient measures 2.0 mmHg. Aortic valve peak gradient measures 3.7 mmHg. Pulmonic Valve: The pulmonic valve was normal in structure. Pulmonic valve regurgitation is not visualized. Aorta: The aortic root is normal in size and structure. Venous: The inferior vena cava is normal in size with greater than 50% respiratory variability, suggesting right atrial pressure of 3 mmHg. IAS/Shunts: No atrial level shunt detected by color flow Doppler. Additional Comments: A device lead is visualized in the right ventricle.   LEFT VENTRICLE PLAX 2D LVIDd:         5.00 cm      Diastology LVIDs:         3.90 cm      LV e' medial:   6.53 cm/s LV PW:         1.10 cm      LV E/e' medial: 12.8 LV IVS:        0.90 cm  LV Volumes (MOD) LV vol d, MOD A2C: 148.0 ml LV vol d, MOD A4C: 203.0 ml LV vol s, MOD A2C: 54.6 ml LV vol s, MOD A4C: 87.2 ml LV SV MOD A2C:     93.4 ml LV SV MOD A4C:     203.0 ml LV SV MOD BP:      105.5 ml RIGHT VENTRICLE RV Basal diam:  3.70 cm RV S prime:     13.70 cm/s TAPSE (M-mode): 1.8 cm LEFT ATRIUM             Index        RIGHT ATRIUM          Index LA diam:        4.00 cm 1.69 cm/m   RA Area:     9.39 cm LA Vol (A2C):   37.7 ml 15.93 ml/m  RA Volume:   15.90 ml 6.72 ml/m LA Vol (A4C):   20.7 ml 8.75 ml/m LA Biplane Vol: 27.9 ml 11.79 ml/m  AORTIC VALVE AV Vmax:      96.20 cm/s AV Vmean:     66.900 cm/s AV VTI:  0.159 m AV Peak Grad: 3.7 mmHg AV Mean Grad: 2.0 mmHg  AORTA Ao Root diam: 2.70 cm Ao Asc diam:  3.20 cm MITRAL VALVE MV Area (PHT): 5.13 cm MV Decel Time: 148 msec MV E velocity: 83.80 cm/s MV A velocity: 99.00 cm/s MV E/A ratio:  0.85 Dalton McleanMD Electronically signed by Ezra Kanner Signature Date/Time: 06/14/2023/12:22:35 PM    Final    DG Chest Port 1 View Result Date: 06/12/2023 CLINICAL DATA:  Shortness of breath EXAM: PORTABLE CHEST 1 VIEW COMPARISON:  12/07/2022 FINDINGS: Left-sided multi lead pacing device as before. Low lung volumes. Cardiomegaly with central broncho vascular crowding but suspected slight central congestion. No pleural effusion or pneumothorax IMPRESSION: Low lung volumes with cardiomegaly and suspected slight central congestion. Electronically Signed   By: Luke Bun M.D.   On: 06/12/2023 18:04   CT Head Wo Contrast Result Date: 06/12/2023 CLINICAL DATA:  Headache, new onset (Age >= 51y) Headache, increasing frequency or severity R sided weakness EXAM: CT HEAD WITHOUT CONTRAST TECHNIQUE: Contiguous axial images were obtained from the base of the skull  through the vertex without intravenous contrast. RADIATION DOSE REDUCTION: This exam was performed according to the departmental dose-optimization program which includes automated exposure control, adjustment of the mA and/or kV according to patient size and/or use of iterative reconstruction technique. COMPARISON:  CT head April 29, 24. FINDINGS: Brain: No evidence of acute infarction, hemorrhage, hydrocephalus, extra-axial collection or mass lesion/mass effect. Vascular: No hyperdense vessel or unexpected calcification. Skull: No acute fracture. Sinuses/Orbits: Left maxillary sinus mucosal thickening. No acute orbital findings. Other: Chronic nonspecific soft tissue thickening the suboccipital region. IMPRESSION: No evidence of acute intracranial abnormality. Electronically Signed   By: Gilmore GORMAN Molt M.D.   On: 06/12/2023 17:19    Labs:  Basic Metabolic Panel: Recent Labs  Lab 06/22/23 0846 06/23/23 0723 06/24/23 0936 06/28/23 0554  NA 137 133* 136 137  K 4.3 4.0 4.3 3.8  CL 105 100 103 101  CO2 22 22 24 23   GLUCOSE 249* 264* 274* 172*  BUN 17 16 15 16   CREATININE 1.02 0.97 1.07 0.89  CALCIUM  9.0 9.0 9.1 8.7*    CBC: No results for input(s): WBC, NEUTROABS, HGB, HCT, MCV, PLT in the last 168 hours.   CBG: Recent Labs  Lab 06/27/23 2042 06/28/23 0556 06/28/23 1138 06/28/23 1709 06/28/23 2117  GLUCAP 316* 176* 159* 216* 286*   Family history.  Mother with hypertension father with bone and lung cancer.  Denies any colon cancer esophageal cancer or rectal cancer  Brief HPI:   Luis [name] is a 52 y.o. right-handed Jehovah witness male with history of biventricular ICD implantable device/chronic systolic congestive heart failure, nonischemic cardiomyopathy, diabetes mellitus hypertension hyperlipidemia chronic hypoxic respiratory failure morbid obesity with BMI 50.75, PTSD, tobacco as well as polysubstance abuse.  Patient receives his care at the  [facility].  Patient lives alone independent with ADLs using a rollator for mobility.  Presented 06/12/2023 with left side headache x 3 days as well as right side weakness x 2 days.  He reported a fall 4 days prior question mild head injury.  Noted systolic blood pressure in the 200s and glucose in the 500s.  CT/MRI showed no acute intracranial abnormality.  MRA showed no intracranial large vessel occlusion.  Admission chemistries unremarkable except glucose 421 latest urine drug screen negative BNP 119 troponin negative hemoglobin A1c of 12.  Echocardiogram with ejection fraction of 25 to 30%.  Left ventricle demonstrating  global hypokinesis.  Carotid Dopplers with 1-39% stenosis.  Neurology follow-up strokelike episode with right hemiparesis hemisensory loss likely due to suspect left subcortical infarction.  Maintained on Plavix  for CVA prophylaxis no aspirin due to allergy.  Lovenox  added for DVT prophylaxis.  Therapy evaluations completed due to patient decreased functional mobility was admitted for a comprehensive rehab program.   Hospital Course: LELYND POER was admitted to rehab 06/16/2023 for inpatient therapies to consist of PT, ST and OT at least three hours five days a week. Past admission physiatrist, therapy team and rehab RN have worked together to provide customized collaborative inpatient rehab.  Pertaining to patient's left subcortical infarction right lower extremity weakness greater than right upper extremity hemiparesis and sensory loss.  Neurology follow-up suspect left subcortical infarction.  MRA unremarkable.  Maintained on Plavix .  No aspirin due to allergy.  Lovenox  for DVT prophylaxis.  Pain management use of Neurontin  300 mg twice daily as well as with continue Cymbalta  and hydrocodone  used on a limited basis for pain.  Mood stabilization with Cymbalta  he was using trazodone  for sleep.  He was attending full therapies.  Acute on chronic respiratory  failure oxygen saturations maintained monitored every shift.  He continued with CPAP.  Morbid obesity with BMI 50.75 dietary follow-up.  Diabetes mellitus hemoglobin A1c insulin  therapy as directed with full diabetic teaching he would need outpatient follow-up.  Chronic combined systolic and diastolic congestive heart failure status post ICD did receive intravenous Lasix  on 3 occasions monitoring of weight and he remained on Farxiga , Lanoxin  as well as Entresto  with Aldactone  and Bumex  resumed monitoring of weights exhibiting no other signs of fluid overload and follow-up cardiology services.  Blood pressure controlled with  Minipress .  Crestor  ongoing for hyperlipidemia.  Protonix  for GERD with no nausea vomiting.  History of tobacco as well as polysubstance use.  Urine drug screen negative patient did receive counts regards to cessation of illicit drug use.   Blood pressures were monitored on TID basis and controlled  Diabetes has been monitored with ac/hs CBG checks and SSI was use prn for tighter BS control.    Rehab course: During patient's stay in rehab weekly team conferences were held to monitor patient's progress, set goals and discuss barriers to discharge. At admission, patient required contact-guard assist 60 feet rolling walker minimal assist step pivot transfers  Physical exam.  Blood pressure 118/70 pulse 80 temperature 98 respirations 18 oxygen saturation is 92% room air Constitutional.  No acute distress HEENT Head.  Normocephalic and atraumatic Eyes.  Pupils round and reactive to light no discharge without nystagmus Neck.  Supple nontender no JVD without thyromegaly Cardiac regular rate and rhythm without any extra sounds or murmur heard Abdomen.  Soft nontender positive bowel sounds without rebound Respiratory effort normal no respiratory distress without wheeze Skin.  Warm and dry Neurologic.  Alert oriented x 3 normal insight and awareness.  Left side intact.  Right upper  extremity 4/5, right lower extremity 3/5 proximally 0/5 distally.  Sensation decreased to right lower extremity  He/She  has had improvement in activity tolerance, balance, postural control as well as ability to compensate for deficits. He/She has had improvement in functional use RUE/LUE  and RLE/LLE as well as improvement in awareness.  Working with energy conservation techniques.  Ambulates 150 feet to the therapy gym using light contact-guard using rolling walker.  He was able to walk back to his room after each session.  Completed series of reaches outside BOS requiring  close supervision rolling walker.  Completed functional mobility to the bathroom using rolling walker close supervision and transferred to standard toilet seat using grab bar.  Continent of bowel bladder.  Completed ambulatory transfer to walk-in shower using rolling walker and grab bars contact-guard.  Completed upper lower body bathing sitting edge of bed.  Donned shirt with supervision.  We have to bilateral lower extremities into pants without assistive device.  He completed moderately complex delayed recall task with details from verbal messages with minimal assist.  It was discussed with his girlfriend and family need for supervision on discharge.  Full family teaching completed and discharged to home       Disposition:  Discharge disposition: 06-Home-Health Care Svc        Diet: Diabetic diet  Special Instructions: No driving smoking or alcohol   Follow-up with [facility] for ongoing medical care  Continue oxygen therapy as prior to admission  Medications at discharge. 1.  Tylenol  as needed 2.  Entresto  97-103 mg twice daily 3.  Plavix  75 mg p.o. daily 4.  Farxiga  10 mg p.o. daily 5.  Lanoxin  0.125 mg p.o. daily 6.  Cymbalta  30 mg p.o. daily 7.  Hydrocodone  1 tablet every 4 hours as needed moderate pain 8.Isoniazid  300 mg daily 9.  Isoniazid  300 mg daily 10.  Protonix  40 mg p.o.  daily 11.  Minipress  2 mg p.o. nightly 12.  Vitamin B6 100 mg p.o. daily 13.  Crestor  20 mg p.o. daily 14.  Aldactone  25 mg p.o. daily 15.  Trazodone  300 mg p.o. nightly 17.  Lantus  insulin  38 units twice daily 18.  NovoLog  15 units 3 times daily meal coverage 19.  Atarax  25 mg 3 times daily as needed anxiety 20.  Coreg  3.125 mg twice daily 21.  Neurontin  300 mg twice daily 22.  Bumex  2 mg twice daily 23.  Klor-Con  20 mill equivalents daily  Discharge Instructions     Ambulatory referral to Neurology   Complete by: As directed    An appointment is requested in approximately: 4 weeks subcortical infarction   Ambulatory referral to Physical Medicine Rehab   Complete by: As directed    Moderate complexity follow-up 1 to 2 weeks subcortical infarction        Follow-up Information     Kirsteins, Prentice BRAVO, MD Follow up.   Specialty: Physical Medicine and Rehabilitation Why: Office to call for appointment Contact information: [address] [place] [address] [place] [address] [phone]         Anner Alm ORN, MD Follow up.   Specialty: Cardiology Why: Call for appointment Contact information: [address] [place] [address] [phone]                 Signed: Toribio JINNY Pitch 06/29/2023, 4:59 AM

## 2023-06-17 NOTE — Evaluation (Signed)
Speech Language Pathology Assessment and Plan  Patient Details  Name: Luis [name] MRN: [id number] Date of Birth: [birth year]  SLP Diagnosis: Cognitive Impairments;Dysphagia  Rehab Potential: Excellent ELOS: 7-10 days    Today's Date: 06/17/2023 SLP Individual Time: 1100-1200 SLP Individual Time Calculation (min): 60 min   Hospital Problem: Principal Problem:   Subcortical infarction [facility])  Past Medical History:  Past Medical History:  Diagnosis Date   Anxiety    Biventricular ICD (implantable cardioverter-defibrillator) in place    Chronic systolic CHF (congestive heart failure) (HCC)    Cocaine use    Diabetes mellitus without complication (HCC)    GERD (gastroesophageal reflux disease)    HLD (hyperlipidemia)    Hypertension    Morbid obesity (HCC)    NICM (nonischemic cardiomyopathy) (HCC)    OSA (obstructive sleep apnea)    PTSD (post-traumatic stress disorder)    on Depakote   Refusal of blood transfusions as patient is Jehovah's Witness    Tobacco abuse    Past Surgical History:  Past Surgical History:  Procedure Laterality Date   BIV ICD INSERTION CRT-D     FOOT FRACTURE SURGERY     ICD GENERATOR CHANGEOUT N/A 03/23/2022   Procedure: ICD GENERATOR CHANGEOUT;  Surgeon: Mealor, Roberts Gaudy, MD;  Location: [facility] INVASIVE CV LAB;  Service: Cardiovascular;  Laterality: N/A;   RIGHT HEART CATH N/A 03/06/2022   Procedure: RIGHT HEART CATH;  Surgeon: Orbie Pyo, MD;  Location: [facility] INVASIVE CV LAB;  Service: Cardiovascular;  Laterality: N/A;   RIGHT/LEFT HEART CATH AND CORONARY ANGIOGRAPHY N/A 04/16/2022   Procedure: RIGHT/LEFT HEART CATH AND CORONARY ANGIOGRAPHY;  Surgeon: Laurey Morale, MD;  Location: [facility] INVASIVE CV LAB;  Service: Cardiovascular;  Laterality: N/A;   ROTATOR CUFF REPAIR     TONSILLECTOMY      Assessment / Plan / Recommendation Clinical Impression HPI: Luis [name] is a 52 y.o. male with a history of anxiety, csCHF, cocaine use,  DM, OSA/CPAP, prior left CVA who presented to [facility] ED  on 06/12/23 with a 5 day history of right sided weakness and sensory loss. BP elevated upon arrival to 200+ systolic. He reported having had a fall 4 days prior with "mild" head injury. CT of head was negative for acute disease. MRI also without acute findings. UDS positive for cocaine Neurology feels pt suffered a left sided subcortical infarct.   Clinical Impression:  Bedside Swallow Evaluation: Bedside swallow evaluation was completed to assess for s/sx of oropharyngeal dysphagia. Oral mechanism exam revealed generalized oral weakness and reduced lingual strength. POs offered included thin liquids via cup/straw, puree and solids. Patient with x1 delayed throat clear after puree, though no other s/sx of aspiration. Timely mastication and independent use of lingual sweep to clear oral cavity. Per patient request and reports of no current dental pain, recommend upgrade to regular/thin diet with standardized swallowing precautions (single sips/bites, slow rate, upright at 90 degrees). Intermittent supervision and set up A due to physical deficits. SLP will follow for diet tolerance and s/sx of aspiration.  Communication: Receptive and expressive language WFL Cognition: The Cognistat was administered to assess cognitive-linguistic functioning. Patient scored WFL on all subtests with the exception of delayed recall and calculations, indicative of moderate deficits in memory and problem solving. Patient reports changes in short term memory and R weakness since admission, indicating WFL awareness. No observed deficits in attention throughout session. Of note, patient required transfer to bathroom x2 and briefly answered phone call  from [employer].  Recommend targeting mildly complex problem solving and short term memory during inpatient rehab stay.  Dysarthria: Patient 100% intelligible at the conversational level  Pt would  benefit from skilled ST services to maximize dysphagia and cognition in order to maximize functional independence at d/c. Anticipate patient will require supervision at d/c and f/u SLP services.    Skilled Therapeutic Interventions          Patient evaluated using a non-standardized cognitive linguistic assessment and bedside swallow evaluation to assess current cognitive, communicative and swallowing function. See above for details.    SLP Assessment  Patient will need skilled Speech Lanaguage Pathology Services during CIR admission    Recommendations  SLP Diet Recommendations: Age appropriate regular solids;Thin Liquid Administration via: Cup;Straw Medication Administration: Whole meds with liquid Supervision: Patient able to self feed;Intermittent supervision to cue for compensatory strategies Compensations: Minimize environmental distractions;Slow rate;Small sips/bites;Lingual sweep for clearance of pocketing Postural Changes and/or Swallow Maneuvers: Seated upright 90 degrees Oral Care Recommendations: Oral care BID Recommendations for Other Services: Neuropsych consult Patient destination: Home Follow up Recommendations: Outpatient SLP Equipment Recommended: None recommended by SLP    SLP Frequency 3 to 5 out of 7 days   SLP Duration  SLP Intensity  SLP Treatment/Interventions 7-10 days  Minumum of 1-2 x/day, 30 to 90 minutes  Cognitive remediation/compensation;Cueing hierarchy;Dysphagia/aspiration precaution training;Functional tasks;Internal/external aids;Patient/family education;Therapeutic Activities;Therapeutic Exercise    Pain None reported   SLP Evaluation Cognition Overall Cognitive Status: Impaired/Different from baseline Arousal/Alertness: Awake/alert Orientation Level: Oriented X4 Year: 2024 Month: December Day of Week: Incorrect Attention: Sustained;Focused Focused Attention: Appears intact Sustained Attention: Appears intact Memory: Impaired Memory  Impairment: Storage deficit;Retrieval deficit;Decreased short term memory;Decreased recall of new information Decreased Short Term Memory: Verbal basic;Functional basic Awareness: Appears intact Problem Solving: Impaired Problem Solving Impairment: Verbal basic;Verbal complex Safety/Judgment: Appears intact  Comprehension Auditory Comprehension Overall Auditory Comprehension: Appears within functional limits for tasks assessed Expression Expression Primary Mode of Expression: Verbal Verbal Expression Overall Verbal Expression: Appears within functional limits for tasks assessed Written Expression Dominant Hand: Right Oral Motor Oral Motor/Sensory Function Overall Oral Motor/Sensory Function: Mild impairment Facial ROM: Reduced right Facial Symmetry: Abnormal symmetry right Facial Strength: Within Functional Limits Facial Sensation: Reduced right;Suspected CN V (Trigeminal) dysfunction Lingual ROM: Within Functional Limits Lingual Symmetry: Within Functional Limits Lingual Strength: Reduced Lingual Sensation: Within Functional Limits Velum: Within Functional Limits Mandible: Within Functional Limits Motor Speech Overall Motor Speech: Appears within functional limits for tasks assessed  Care Tool Care Tool Cognition Ability to hear (with hearing aid or hearing appliances if normally used Ability to hear (with hearing aid or hearing appliances if normally used): 0. Adequate - no difficulty in normal conservation, social interaction, listening to TV   Expression of Ideas and Wants Expression of Ideas and Wants: 4. Without difficulty (complex and basic) - expresses complex messages without difficulty and with speech that is clear and easy to understand   Understanding Verbal and Non-Verbal Content Understanding Verbal and Non-Verbal Content: 4. Understands (complex and basic) - clear comprehension without cues or repetitions  Memory/Recall Ability Memory/Recall Ability : That he or  she is in a hospital/hospital unit;Current season   Bedside Swallowing Assessment General Diet Prior to this Study: Dysphagia 3 (mechanical soft);Thin liquids (Level 0) Respiratory Status: Supplemental O2 delivered via (comment) ([place]) Behavior/Cognition: Alert;Cooperative Oral Cavity - Dentition: Missing dentition Self-Feeding Abilities: Needs set up Vision: Functional for self-feeding Patient Positioning: Upright in bed Baseline Vocal Quality: Normal Volitional Cough: Strong  Volitional Swallow: Able to elicit  Ice Chips Ice chips: Not tested Thin Liquid Thin Liquid: Within functional limits Presentation: Cup;Straw Nectar Thick Nectar Thick Liquid: Not tested Honey Thick Honey Thick Liquid: Not tested Puree Puree: Impaired Presentation: Self Fed;Spoon Pharyngeal Phase Impairments: Throat Clearing - Delayed Solid Solid: Within functional limits Presentation: Self Fed BSE Assessment Suspected Esophageal Findings Suspected Esophageal Findings: Belching Risk for Aspiration Impact on safety and function: Mild aspiration risk Other Related Risk Factors: Decreased respiratory status  Short Term Goals: Week 1: SLP Short Term Goal 1 (Week 1): Stg=ltg due to elos  Refer to Care Plan for Long Term Goals  Recommendations for other services: Neuropsych  Discharge Criteria: Patient will be discharged from SLP if patient refuses treatment 3 consecutive times without medical reason, if treatment goals not met, if there is a change in medical status, if patient makes no progress towards goals or if patient is discharged from hospital.  The above assessment, treatment plan, treatment alternatives and goals were discussed and mutually agreed upon: by patient  Nura Cahoon M.A., CF-SLP 06/17/2023, 12:17 PM

## 2023-06-17 NOTE — Progress Notes (Signed)
PROGRESS NOTE   Subjective/Complaints:  Patient complains of numbness tingling on the right side.  Still feels little weak on the right side.  He is able to hold his cell phone with his right hand.  He uses oxygen as needed at home he thinks he uses it most of the day when he is active Review of systems negative chest pain, shortness of breath, nausea vomiting diarrhea  Objective:   No results found. Recent Labs    06/15/23 0515 06/16/23 1824  WBC 7.9 9.1  HGB 12.9* 14.3  HCT 39.4 44.7  PLT 235 273   Recent Labs    06/15/23 0515 06/16/23 1137 06/16/23 1824  NA 133* 136  --   K 3.6 5.2*  --   CL 101 103  --   CO2 24 19*  --   GLUCOSE 287* 277*  --   BUN 15 18  --   CREATININE 0.86 1.47* 1.30*  CALCIUM 8.7* 9.2  --     Intake/Output Summary (Last 24 hours) at 06/17/2023 1311 Last data filed at 06/17/2023 1253 Gross per 24 hour  Intake 832 ml  Output 2600 ml  Net -1768 ml        Physical Exam: Vital Signs Blood pressure (!) 142/61, pulse 72, temperature 97.6 F (36.4 C), resp. rate 19, height 5\' 6"  (1.676 m), weight (!) 141.7 kg, SpO2 96%.   General: No acute distress Mood and affect are appropriate Heart: Regular rate and rhythm no rubs murmurs or extra sounds Lungs: Clear to auscultation, breathing unlabored, no rales or wheezes Abdomen: Positive bowel sounds, soft nontender to palpation, nondistended Extremities: No clubbing, cyanosis, or edema Skin: No evidence of breakdown, no evidence of rash Neurologic: Cranial nerves II through XII intact, motor strength is 5/5 in left and 4/5 right deltoid, bicep, tricep, grip, hip flexor, knee extensors, ankle dorsiflexor and plantar flexor Sensory exam normal sensation to light touch and proprioception in bilateral upper and lower extremities Cerebellar exam normal finger to nose to finger as well as heel to shin in bilateral upper and lower  extremities Musculoskeletal: Full range of motion in all 4 extremities. No joint swelling   Assessment/Plan: 1. Functional deficits which require 3+ hours per day of interdisciplinary therapy in a comprehensive inpatient rehab setting. Physiatrist is providing close team supervision and 24 hour management of active medical problems listed below. Physiatrist and rehab team continue to assess barriers to discharge/monitor patient progress toward functional and medical goals  Care Tool:  Bathing    Body parts bathed by patient: Right arm, Left arm, Chest, Abdomen, Front perineal area, Buttocks, Right upper leg, Left upper leg, Right lower leg, Left lower leg, Face         Bathing assist Assist Level: Contact Guard/Touching assist     Upper Body Dressing/Undressing Upper body dressing   What is the patient wearing?: Pull over shirt    Upper body assist Assist Level: Supervision/Verbal cueing    Lower Body Dressing/Undressing Lower body dressing      What is the patient wearing?: Pants, Underwear/pull up     Lower body assist Assist for lower body dressing: Minimal Assistance - Patient >  75%     Toileting Toileting    Toileting assist Assist for toileting: Minimal Assistance - Patient > 75%     Transfers Chair/bed transfer  Transfers assist     Chair/bed transfer assist level: Minimal Assistance - Patient > 75%     Locomotion Ambulation   Ambulation assist      Assist level: Contact Guard/Touching assist (ambulation to bathroom) Assistive device: Walker-rolling     Walk 10 feet activity   Assist           Walk 50 feet activity   Assist           Walk 150 feet activity   Assist           Walk 10 feet on uneven surface  activity   Assist           Wheelchair     Assist               Wheelchair 50 feet with 2 turns activity    Assist            Wheelchair 150 feet activity     Assist           Blood pressure (!) 142/61, pulse 72, temperature 97.6 F (36.4 C), resp. rate 19, height 5\' 6"  (1.676 m), weight (!) 141.7 kg, SpO2 96%.  Medical Problem List and Plan: 1. 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             -patient may shower             -ELOS/Goals: 7 days             Admit to CIR 2.  Antithrombotics: -DVT/anticoagulation:  Pharmaceutical: Lovenox             -antiplatelet therapy: Plavix 75 mg daily.  Patient is allergic to aspirin 3. Pain Management: Hydrocodone 1 tablet every 4 hours as needed 4. Mood/Behavior/Sleep: Cymbalta 30 mg daily, trazodone 200 mg nightly, Atarax as needed             -antipsychotic agents: N/A 5. Neuropsych/cognition: This patient is capable of making decisions on his own behalf. 6. Skin/Wound Care: Routine skin checks 7. Fluids/Electrolytes/Nutrition: Routine in and outs with follow-up chemistries 8.  Acute/chronic respiratory failure.  Monitor oxygen saturations 9.  OSA.  CPAP 10.  Morbid obesity.  BMI 50.75.  Dietary follow-up 11.  Poorly controlled diabetes mellitus.  Hemoglobin A1c 12.  Semglee 15 units daily, NovoLog 15 units 3 times daily.  Diabetic teaching 12.  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 Farxiga 10 mg daily, Lanoxin 0.125 mg daily, Bumex 2 mg twice daily, Aldactone 25 mg daily.  Monitor for any signs of fluid overload. Daily weights ordered 13.  Hypertension. Continue Toprol-XL 150 mg daily, Minipress 2 mg nightly.  Monitor with increased mobility 14.  Hyperlipidemia.  Continue Crestor 15.  GERD.  Continue Protonix 16.  History of tobacco as well as polysubstance abuse.  Counseling    LOS: 1 days A FACE TO FACE EVALUATION WAS PERFORMED  Erick Colace 06/17/2023, 1:11 PM

## 2023-06-17 NOTE — Evaluation (Signed)
Occupational Therapy Assessment and Plan  Patient Details  Name: Luis [name] MRN: [id number] Date of Birth: 10/25/1970  OT Diagnosis: cognitive deficits, hemiplegia affecting dominant side, and muscle weakness (generalized) Rehab Potential: Rehab Potential (ACUTE ONLY): Good ELOS: 7-9 days   Today's Date: 06/17/2023 OT Individual Time: 2440-1027 OT Individual Time Calculation (min): 72 min     Hospital Problem: Principal Problem:   Subcortical infarction [facility])   Past Medical History:  Past Medical History:  Diagnosis Date   Anxiety    Biventricular ICD (implantable cardioverter-defibrillator) in place    Chronic systolic CHF (congestive heart failure) (HCC)    Cocaine use    Diabetes mellitus without complication (HCC)    GERD (gastroesophageal reflux disease)    HLD (hyperlipidemia)    Hypertension    Morbid obesity (HCC)    NICM (nonischemic cardiomyopathy) (HCC)    OSA (obstructive sleep apnea)    PTSD (post-traumatic stress disorder)    on Depakote   Refusal of blood transfusions as patient is Jehovah's Witness    Tobacco abuse    Past Surgical History:  Past Surgical History:  Procedure Laterality Date   BIV ICD INSERTION CRT-D     FOOT FRACTURE SURGERY     ICD GENERATOR CHANGEOUT N/A 03/23/2022   Procedure: ICD GENERATOR CHANGEOUT;  Surgeon: Mealor, Roberts Gaudy, MD;  Location: [facility] INVASIVE CV LAB;  Service: Cardiovascular;  Laterality: N/A;   RIGHT HEART CATH N/A 03/06/2022   Procedure: RIGHT HEART CATH;  Surgeon: Orbie Pyo, MD;  Location: [facility] INVASIVE CV LAB;  Service: Cardiovascular;  Laterality: N/A;   RIGHT/LEFT HEART CATH AND CORONARY ANGIOGRAPHY N/A 04/16/2022   Procedure: RIGHT/LEFT HEART CATH AND CORONARY ANGIOGRAPHY;  Surgeon: Laurey Morale, MD;  Location: [facility] INVASIVE CV LAB;  Service: Cardiovascular;  Laterality: N/A;   ROTATOR CUFF REPAIR     TONSILLECTOMY      Assessment & Plan Clinical Impression: Luis [name] is a  52 year old right-handed male Jehovah witness with history of biventricular ICD implantable device/chronic systolic congestive heart failure/nonischemic cardiomyopathy, diabetes mellitus, hyperlipidemia, chronic hypoxic respiratory failure on 2 L oxygen, hypertension, morbid obesity with BMI of 50.75, PTSD, tobacco as well as polysubstance abuse.  Review patient lives alone.  1 level apartment.  Independent with ADLs and uses a rollator for mobility.  Presented 06/12/2023 with left-sided headache x 3 days as well as right side weakness x 2 days.  He reported a fall 4 days prior question mild head injury.  Noted systolic blood pressures in the 200s and glucose in the 500s.  CT/MRI showed no acute intracranial abnormality.  MRA showed no intracranial large vessel occlusion.  Admission chemistries unremarkable except glucose 421, latest urine drug screen negative, BNP 119, troponin negative, hemoglobin A1c 12.0.  Echocardiogram ejection fraction of 25 to 30%.  Left ventricle demonstrating global hypokinesis.  Carotid Dopplers with 1-39% stenosis.  Neurology follow-up strokelike episode with right hemiparesis hemisensory loss likely due to suspect left subcortical infarct/conversion reaction.  Currently maintained on Plavix for CVA prophylaxis no aspirin due to allergy.  Lovenox added for DVT prophylaxis.  Presently on mechanical soft diet.  Therapy evaluations completed due to patient's decreased functional mobility was admitted for a comprehensive rehab program. Currently working with therapy.  Patient transferred to CIR on 06/16/2023 .    Patient currently requires min with basic self-care skills secondary to muscle weakness, decreased cardiorespiratoy endurance, decreased coordination and decreased motor planning, decreased motor planning, decreased awareness, decreased safety awareness, and decreased  memory, central origin, and decreased sitting balance, decreased standing balance, decreased postural control,  hemiplegia, and decreased balance strategies.  Prior to hospitalization, patient could complete BADLs with modified independent .  Patient will benefit from skilled intervention to decrease level of assist with basic self-care skills and increase independence with basic self-care skills prior to discharge home with care partner.  Anticipate patient will require intermittent supervision and follow up home health.  OT - End of Session Activity Tolerance: Decreased this session Endurance Deficit: Yes OT Assessment Rehab Potential (ACUTE ONLY): Good OT Patient demonstrates impairments in the following area(s): Balance;Safety;Cognition;Sensory;Edema;Skin Integrity;Endurance;Vision;Motor;Pain;Perception OT Basic ADL's Functional Problem(s): Toileting;Dressing;Bathing;Grooming OT Transfers Functional Problem(s): Toilet;Tub/Shower OT Additional Impairment(s): Fuctional Use of Upper Extremity OT Plan OT Intensity: Minimum of 1-2 x/day, 45 to 90 minutes OT Frequency: 5 out of 7 days OT Duration/Estimated Length of Stay: 7-9 days OT Treatment/Interventions: Balance/vestibular training;Discharge planning;Pain management;Self Care/advanced ADL retraining;Therapeutic Activities;UE/LE Coordination activities;Cognitive remediation/compensation;Disease mangement/prevention;Functional mobility training;Patient/family education;Skin care/wound managment;Therapeutic Exercise;Visual/perceptual remediation/compensation;Community reintegration;DME/adaptive equipment instruction;Neuromuscular re-education;Psychosocial support;Splinting/orthotics;UE/LE Strength taining/ROM;Wheelchair propulsion/positioning;Functional electrical stimulation OT Self Feeding Anticipated Outcome(s): mod I OT Basic Self-Care Anticipated Outcome(s): mod I to supevision OT Toileting Anticipated Outcome(s): mod I OT Bathroom Transfers Anticipated Outcome(s): mod I OT Recommendation Patient destination: Home Follow Up Recommendations: Home  health OT Equipment Recommended: To be determined   OT Evaluation Precautions/Restrictions  Precautions Precautions: Fall Precaution Comments: Uses 2L OT PRN Restrictions Weight Bearing Restrictions Per Provider Order: No General Chart Reviewed: Yes Additional Pertinent History: chronic systolic HF Family/Caregiver Present: No Vital Signs Therapy Vitals Temp: 97.6 F (36.4 C) Pulse Rate: 72 Resp: 19 BP: (!) 142/61 Patient Position (if appropriate): Sitting Oxygen Therapy SpO2: 96 % O2 Device: Room Air Pain Pain Assessment Pain Scale: 0-10 Pain Score: 0-No pain Home Living/Prior Functioning Home Living Living Arrangements: Alone Type of Home: Apartment Home Access: Level entry Home Layout: One level Bathroom Shower/Tub: Health visitor: Pharmacist, community: Yes  Lives With: Alone IADL History Homemaking Responsibilities: No Current License: Yes Mode of Transportation: Set designer Occupation: Retired Leisure and Hobbies: "Aeronautical engineer and modeling cars " Prior Function Level of Independence: Independent with basic ADLs, Requires assistive device for independence  Able to Take Stairs?: Yes (one at a time using a railing) Driving: Yes (does not drive often) Vocation: Retired Administrator, sports Baseline Vision/History: 0 No visual deficits Ability to See in Adequate Light: 0 Adequate Patient Visual Report: Blurring of vision Vision Assessment?: No apparent visual deficits Additional Comments: reports blurred vision in R eye Perception  Perception: Impaired Perception-Other Comments: mild depth perception deficit Praxis Praxis: Impaired Praxis Impairment Details: Motor planning Praxis-Other Comments: slowed motor movements Cognition Cognition Overall Cognitive Status: Impaired/Different from baseline Arousal/Alertness: Awake/alert Memory: Impaired Memory Impairment: Storage deficit;Retrieval deficit;Decreased short term memory;Decreased recall  of new information Decreased Short Term Memory: Verbal basic;Functional basic Attention: Sustained;Focused Focused Attention: Appears intact Sustained Attention: Appears intact Awareness: Appears intact Problem Solving: Impaired Problem Solving Impairment: Verbal basic;Verbal complex Safety/Judgment: Appears intact Brief Interview for Mental Status (BIMS) Repetition of Three Words (First Attempt): 3 Temporal Orientation: Year: Correct Temporal Orientation: Month: Accurate within 5 days Temporal Orientation: Day: Incorrect Recall: "Sock": Yes, after cueing ("something to wear") Recall: "Blue": No, could not recall Recall: "Bed": Yes, no cue required BIMS Summary Score: 11 Sensation Sensation Light Touch: Impaired Detail Light Touch Impaired Details: Impaired RLE;Impaired RUE Hot/Cold: Impaired Detail Hot/Cold Impaired Details: Impaired RUE;Impaired RLE Proprioception: Appears Intact Stereognosis: Impaired by gross assessment Coordination Gross Motor Movements are Fluid and Coordinated:  No Fine Motor Movements are Fluid and Coordinated: No Coordination and Movement Description: decreased step length and balance 2/2 weakness Finger Nose Finger Test: slowed motor movements with undershooting on R Motor  Motor Motor: Hemiplegia Motor - Skilled Clinical Observations: Mild R hemiplegia  Trunk/Postural Assessment  Cervical Assessment Cervical Assessment: Within Functional Limits Thoracic Assessment Thoracic Assessment: Within Functional Limits Lumbar Assessment Lumbar Assessment: Exceptions to [facility] (posterior pelvic tilt) Postural Control Postural Control: Deficits on evaluation Trunk Control: mild deficit Protective Responses: delayed  Balance Balance Balance Assessed: Yes Static Sitting Balance Static Sitting - Balance Support: Feet unsupported Static Sitting - Level of Assistance: 5: Stand by assistance (SBA-CGA) Static Standing Balance Static Standing - Balance  Support: During functional activity;Bilateral upper extremity supported (SBA-CGA) Static Standing - Level of Assistance: 5: Stand by assistance;4: Min assist (SBA-CGA) Dynamic Standing Balance Dynamic Standing - Balance Support: During functional activity Dynamic Standing - Level of Assistance: 5: Stand by assistance;4: Min assist (CGA) Extremity/Trunk Assessment RUE Assessment RUE Assessment: Exceptions to [facility] General Strength Comments: 4/5 overall wtih slowed motor movements LUE Assessment LUE Assessment: Within Functional Limits  Care Tool Care Tool Self Care Eating   Eating Assist Level: Set up assist    Oral Care    Oral Care Assist Level: Set up assist    Bathing   Body parts bathed by patient: Right arm;Left arm;Chest;Abdomen;Front perineal area;Buttocks;Right upper leg;Left upper leg;Right lower leg;Left lower leg;Face     Assist Level: Contact Guard/Touching assist    Upper Body Dressing(including orthotics)   What is the patient wearing?: Pull over shirt   Assist Level: Supervision/Verbal cueing    Lower Body Dressing (excluding footwear)   What is the patient wearing?: Pants;Underwear/pull up Assist for lower body dressing: Minimal Assistance - Patient > 75%    Putting on/Taking off footwear   What is the patient wearing?: Shoes Assist for footwear: Moderate Assistance - Patient 50 - 74%       Care Tool Toileting Toileting activity   Assist for toileting: Minimal Assistance - Patient > 75%     Care Tool Bed Mobility Roll left and right activity   Roll left and right assist level: Supervision/Verbal cueing    Sit to lying activity   Sit to lying assist level: Minimal Assistance - Patient > 75%    Lying to sitting on side of bed activity   Lying to sitting on side of bed assist level: the ability to move from lying on the back to sitting on the side of the bed with no back support.: Minimal Assistance - Patient > 75%     Care Tool Transfers Sit to  stand transfer   Sit to stand assist level: Minimal Assistance - Patient > 75%    Chair/bed transfer   Chair/bed transfer assist level: Minimal Assistance - Patient > 75%     Toilet transfer   Assist Level: Minimal Assistance - Patient > 75%     Care Tool Cognition  Expression of Ideas and Wants Expression of Ideas and Wants: 4. Without difficulty (complex and basic) - expresses complex messages without difficulty and with speech that is clear and easy to understand  Understanding Verbal and Non-Verbal Content Understanding Verbal and Non-Verbal Content: 4. Understands (complex and basic) - clear comprehension without cues or repetitions   Memory/Recall Ability Memory/Recall Ability : That he or she is in a hospital/hospital unit;Current season   Refer to Care Plan for Long Term Goals  SHORT TERM GOAL WEEK 1 OT  Short Term Goal 1 (Week 1): STG=LTG d/t ELOS  Recommendations for other services: None    Skilled Therapeutic Intervention Skilled Therapeutic Interventions/Progress Updates:  1:1 OT evaluation and intervention initiated with skilled education provided on OT role, goals, and POC. Pt received semi-reclined in bed presenting to be in good spirits receptive to skilled OT session reporting 0/10 pain- OT offering intermittent rest breaks, repositioning, and therapeutic support to optimize participation in therapy session. Pt completed BADLs at levels listed below this session using RW for all functional mobility and transfers. Pt presenting with R hemiplegia limiting Pt's independence and safety in BADLs. Pt would benefit from continued OT services in IPR setting. Pt was left resting in recliner with call bell in reach, chair alarm on, and all needs met.   ADL ADL Equipment Provided: Reacher;Sock aid Eating: Set up Where Assessed-Eating: Chair Grooming: Setup Where Assessed-Grooming: Edge of bed;Chair Upper Body Bathing: Contact guard Where Assessed-Upper Body Bathing:  Shower Lower Body Bathing: Contact guard Where Assessed-Lower Body Bathing: Shower Upper Body Dressing: Supervision/safety Where Assessed-Upper Body Dressing: Edge of bed Lower Body Dressing: Minimal assistance Where Assessed-Lower Body Dressing: Edge of bed Toileting: Contact guard;Minimal assistance Where Assessed-Toileting: Teacher, adult education: Minimal assistance;Contact Pension scheme manager Method: Proofreader: Engineer, technical sales: Not assessed Film/video editor: Minimal assistance;Contact guard Film/video editor Method: Designer, industrial/product: Grab bars Mobility  Bed Mobility Bed Mobility: Rolling Left;Rolling Right;Supine to Sit;Sit to Supine Rolling Right: Supervision/verbal cueing Rolling Left: Supervision/Verbal cueing Supine to Sit: Contact Guard/Touching assist Sit to Supine: Contact Guard/Touching assist Transfers Sit to Stand: Minimal Assistance - Patient > 75%;Contact Guard/Touching assist Stand to Sit: Contact Guard/Touching assist;Minimal Assistance - Patient > 75%   Discharge Criteria: Patient will be discharged from OT if patient refuses treatment 3 consecutive times without medical reason, if treatment goals not met, if there is a change in medical status, if patient makes no progress towards goals or if patient is discharged from hospital.  The above assessment, treatment plan, treatment alternatives and goals were discussed and mutually agreed upon: by patient and by family  Clide Deutscher 06/17/2023, 12:59 PM

## 2023-06-17 NOTE — Progress Notes (Signed)
Patient ID: Luis [name], male   DOB: [birth year], 52 y.o.   MRN: [id number] Met with the patient to review current situation, rehab process, team conference and plan of care. Discussed secondary risk management including HTN, HLD, DM and HF. Patient concerned about insulin dose/CBGs running high in the hospital on a lower dose than at home. O2 2.5 L min [name]. Asking about a HH aide at discharge, friend available to provide assist at discharge.  Also concerned about CPAP machine; appointment this Thursday missed as hospitalized. Continue to follow along to address educational needs to facilitate preparation for discharge. [name]

## 2023-06-17 NOTE — Evaluation (Signed)
Physical Therapy Assessment and Plan  Patient Details  Name: Luis [name] MRN: [id number] Date of Birth: 1971-05-11  PT Diagnosis: Abnormal posture, Abnormality of gait, Cognitive deficits, Difficulty walking, Hemiparesis dominant, Impaired sensation, Muscle weakness, and Pain in headache Rehab Potential: Good ELOS: 7-10 days   Today's Date: 06/17/2023 PT Individual Time: 1305-1404 PT Individual Time Calculation (min): 59 min    Hospital Problem: Principal Problem:   Subcortical infarction [facility])   Past Medical History:  Past Medical History:  Diagnosis Date   Anxiety    Biventricular ICD (implantable cardioverter-defibrillator) in place    Chronic systolic CHF (congestive heart failure) (HCC)    Cocaine use    Diabetes mellitus without complication (HCC)    GERD (gastroesophageal reflux disease)    HLD (hyperlipidemia)    Hypertension    Morbid obesity (HCC)    NICM (nonischemic cardiomyopathy) (HCC)    OSA (obstructive sleep apnea)    PTSD (post-traumatic stress disorder)    on Depakote   Refusal of blood transfusions as patient is Jehovah's Witness    Tobacco abuse    Past Surgical History:  Past Surgical History:  Procedure Laterality Date   BIV ICD INSERTION CRT-D     FOOT FRACTURE SURGERY     ICD GENERATOR CHANGEOUT N/A 03/23/2022   Procedure: ICD GENERATOR CHANGEOUT;  Surgeon: Mealor, Roberts Gaudy, MD;  Location: [facility] INVASIVE CV LAB;  Service: Cardiovascular;  Laterality: N/A;   RIGHT HEART CATH N/A 03/06/2022   Procedure: RIGHT HEART CATH;  Surgeon: Orbie Pyo, MD;  Location: [facility] INVASIVE CV LAB;  Service: Cardiovascular;  Laterality: N/A;   RIGHT/LEFT HEART CATH AND CORONARY ANGIOGRAPHY N/A 04/16/2022   Procedure: RIGHT/LEFT HEART CATH AND CORONARY ANGIOGRAPHY;  Surgeon: Laurey Morale, MD;  Location: [facility] INVASIVE CV LAB;  Service: Cardiovascular;  Laterality: N/A;   ROTATOR CUFF REPAIR     TONSILLECTOMY      Assessment & Plan Clinical Impression:  Patient is a 52 y.o.  right-handed male Jehovah witness with history of biventricular ICD implantable device/chronic systolic congestive heart failure/nonischemic cardiomyopathy, diabetes mellitus, hyperlipidemia, chronic hypoxic respiratory failure on 2 L oxygen, hypertension, morbid obesity with BMI of 50.75, PTSD, tobacco as well as polysubstance abuse.  Review patient lives alone.  1 level apartment.  Independent with ADLs and uses a rollator for mobility.  Presented 06/12/2023 with left-sided headache x 3 days as well as right side weakness x 2 days.  He reported a fall 4 days prior question mild head injury.  Noted systolic blood pressures in the 200s and glucose in the 500s.  CT/MRI showed no acute intracranial abnormality.  MRA showed no intracranial large vessel occlusion.  Admission chemistries unremarkable except glucose 421, latest urine drug screen negative, BNP 119, troponin negative, hemoglobin A1c 12.0.  Echocardiogram ejection fraction of 25 to 30%.  Left ventricle demonstrating global hypokinesis.  Carotid Dopplers with 1-39% stenosis.  Neurology follow-up strokelike episode with right hemiparesis hemisensory loss likely due to suspect left subcortical infarct/conversion reaction.  Currently maintained on Plavix for CVA prophylaxis no aspirin due to allergy.  Lovenox added for DVT prophylaxis.  Presently on mechanical soft diet.  Therapy evaluations completed due to patient's decreased functional mobility was admitted for a comprehensive rehab program. Currently working with therapy. Patient transferred to CIR on 06/16/2023 .   Patient currently requires min with mobility secondary to muscle weakness, decreased cardiorespiratoy endurance and decreased oxygen support, impaired timing and sequencing, unbalanced muscle activation, and decreased motor planning,  ,  decreased problem solving and delayed processing, and decreased standing balance, decreased postural control, and decreased balance  strategies.  Prior to hospitalization, patient was modified independent  with mobility and lived with Alone (significant other comes over sometimes) in a Apartment (1st floor) home.  Home access is  Level entry.  Patient will benefit from skilled PT intervention to maximize safe functional mobility, minimize fall risk, and decrease caregiver burden for planned discharge home with intermittent assist.  Anticipate patient will benefit from follow up [facility] at discharge.  PT - End of Session Activity Tolerance: Tolerates 30+ min activity with multiple rests Endurance Deficit: Yes Endurance Deficit Description: requires frequent seated rest breaks PT Assessment Rehab Potential (ACUTE/IP ONLY): Good PT Barriers to Discharge: Decreased caregiver support;Lack of/limited family support PT Patient demonstrates impairments in the following area(s): Balance;Safety;Sensory;Skin Integrity;Endurance;Motor;Nutrition;Pain PT Transfers Functional Problem(s): Bed Mobility;Bed to Chair;Car;Furniture PT Locomotion Functional Problem(s): Ambulation;Stairs PT Plan PT Intensity: Minimum of 1-2 x/day ,45 to 90 minutes PT Frequency: 5 out of 7 days PT Duration Estimated Length of Stay: 7-10 days PT Treatment/Interventions: Ambulation/gait training;Community reintegration;DME/adaptive equipment instruction;Neuromuscular re-education;Psychosocial support;Stair training;UE/LE Strength taining/ROM;Balance/vestibular training;Discharge planning;Functional electrical stimulation;Pain management;Skin care/wound management;Therapeutic Activities;UE/LE Coordination activities;Cognitive remediation/compensation;Disease management/prevention;Functional mobility training;Patient/family education;Splinting/orthotics;Therapeutic Exercise;Visual/perceptual remediation/compensation PT Transfers Anticipated Outcome(s): mod-I using LRAD PT Locomotion Anticipated Outcome(s): mod-I for limited household level gait using LRAD PT  Recommendation Recommendations for Other Services: Neuropsych consult;Therapeutic Recreation consult Therapeutic Recreation Interventions: Stress management;Outing/community reintergration Follow Up Recommendations: 24 hour supervision/assistance;Home health PT Patient destination: Home Equipment Recommended: To be determined   PT Evaluation Precautions/Restrictions  Precautions Precautions: Fall;Other (comment) Precaution Comments: 2L of O2 via nasal cannula baseline (pt reports using it PRN), mild R hemi (LE>UE) Restrictions Weight Bearing Restrictions Per Provider Order: No Pain Pain Assessment Pain Scale: 0-10 Pain Score: 10-Worst pain ever Pain Type: Acute pain Pain Location: Head Pain Orientation: Anterior Pain Descriptors / Indicators: Headache Pain Frequency: Intermittent Pain Onset: On-going Patients Stated Pain Goal: 0 Pain Intervention(s): Pain med given for lower pain score than stated, per patient request Pain Interference Pain Interference Pain Effect on Sleep: 3. Frequently Pain Interference with Therapy Activities: 1. Rarely or not at all Pain Interference with Day-to-Day Activities: 1. Rarely or not at all Home Living/Prior Functioning Home Living Available Help at Discharge: Other (Comment) (per pt fiance, Antraneal, said she would provide 24hr support) Type of Home: Apartment (1st floor) Home Access: Level entry Home Layout: One level Additional Comments: reports apartment is handicap accessible, on 2L O2 via nasal cannula with pt reporting he uses it as needed depending on how he feels; used rollator and just got a scooter; reports many falls at home  Lives With: Alone (significant other comes over sometimes) Prior Function Level of Independence: Independent with gait;Independent with transfers  Able to Take Stairs?: Yes (one at a time using a rail) Driving: Yes (but not often) Vocation: Retired Gaffer: [employer] Vision/Perception   Vision - History Ability to See in Adequate Light: 0 Adequate Vision - Assessment Eye Alignment: Within Functional Limits Tracking/Visual Pursuits: Able to track stimulus in all quads without difficulty Perception Perception: Impaired Perception-Other Comments: mild depth perception deficit Praxis Praxis: Impaired Praxis Impairment Details: Motor planning Praxis-Other Comments: slowed motor movements  Cognition Overall Cognitive Status: Impaired/Different from baseline Arousal/Alertness: Awake/alert Orientation Level: Oriented X4 Year: 2024 Month: December Day of Week: Incorrect Attention: Focused;Sustained Focused Attention: Appears intact Sustained Attention: Appears intact Memory: Impaired Memory Impairment: Storage deficit;Retrieval deficit;Decreased short term memory;Decreased recall of new information Decreased Short  Term Memory: Verbal basic;Functional basic Awareness: Appears intact Problem Solving: Impaired Problem Solving Impairment: Verbal basic;Verbal complex Safety/Judgment: Appears intact Sensation Sensation Light Touch: Impaired Detail Central sensation comments: unable to feel light touch in R LE, only deep pressure Light Touch Impaired Details: Impaired RUE;Absent RLE Hot/Cold: Not tested Proprioception: Impaired Detail (impaired proprioceptive awareness in R LE during functional mobility) Proprioception Impaired Details: Impaired RLE Stereognosis: Not tested Coordination Gross Motor Movements are Fluid and Coordinated: No Coordination and Movement Description: mild R hemiparesis (LE>UE) with slowed motor movements and impaired motor planning/unablanced muscle activation Motor  Motor Motor: Hemiplegia;Other (comment) Motor - Skilled Clinical Observations: mild R hemiparesis (LE>UE) with slowed motor movements and impaired motor planning/unablanced muscle activation   Trunk/Postural Assessment  Cervical Assessment Cervical Assessment: Within  Functional Limits Thoracic Assessment Thoracic Assessment: Exceptions to [facility] (rounded shoulders) Lumbar Assessment Lumbar Assessment: Exceptions to [facility] (posterior pelvic tilt) Postural Control Postural Control: Deficits on evaluation Trunk Control: mild deficit Protective Responses: delayed  Balance Balance Balance Assessed: Yes Static Sitting Balance Static Sitting - Balance Support: Feet unsupported Static Sitting - Level of Assistance: 5: Stand by assistance Dynamic Sitting Balance Dynamic Sitting - Balance Support: Feet supported Dynamic Sitting - Level of Assistance: Other (comment);5: Stand by assistance (CGA) Static Standing Balance Static Standing - Balance Support: During functional activity;Bilateral upper extremity supported Static Standing - Level of Assistance: Other (comment) (CGA) Dynamic Standing Balance Dynamic Standing - Balance Support: During functional activity Dynamic Standing - Level of Assistance: Other (comment);4: Min assist (CGA) Extremity Assessment  RLE Assessment RLE Assessment: Exceptions to [facility] Passive Range of Motion (PROM) Comments: WFL General Strength Comments: assessed in sitting, demonstrates greater strength during functional mobility tasks RLE Strength Right Hip Flexion: 2-/5 Right Knee Flexion: 2/5 Right Knee Extension: 3-/5 Right Ankle Dorsiflexion: 3/5 Right Ankle Plantar Flexion: 3-/5 LLE Assessment LLE Assessment: Within Functional Limits Active Range of Motion (AROM) Comments: WFL/WNL General Strength Comments: assessed in sitting - 5/5 throughout  Care Tool Care Tool Bed Mobility Roll left and right activity   Roll left and right assist level: Supervision/Verbal cueing    Sit to lying activity   Sit to lying assist level: Minimal Assistance - Patient > 75%    Lying to sitting on side of bed activity   Lying to sitting on side of bed assist level: the ability to move from lying on the back to sitting on the side of the bed  with no back support.: Minimal Assistance - Patient > 75%     Care Tool Transfers Sit to stand transfer   Sit to stand assist level: Minimal Assistance - Patient > 75%    Chair/bed transfer   Chair/bed transfer assist level: Minimal Assistance - Patient > 75%    Car transfer   Car transfer assist level: Minimal Assistance - Patient > 75%      Care Tool Locomotion Ambulation   Assist level: Minimal Assistance - Patient > 75% Assistive device: Walker-rolling Max distance: 136ft  Walk 10 feet activity   Assist level: Minimal Assistance - Patient > 75% Assistive device: Walker-rolling   Walk 50 feet with 2 turns activity   Assist level: Minimal Assistance - Patient > 75% Assistive device: Walker-rolling  Walk 150 feet activity Walk 150 feet activity did not occur: Safety/medical concerns      Walk 10 feet on uneven surfaces activity   Assist level: Minimal Assistance - Patient > 75% Assistive device: Walker-rolling  Stairs   Assist level: Minimal Assistance -  Patient > 75% Stairs assistive device: 2 hand rails Max number of stairs: 4  Walk up/down 1 step activity   Walk up/down 1 step (curb) assist level: Minimal Assistance - Patient > 75% Walk up/down 1 step or curb assistive device: 2 hand rails  Walk up/down 4 steps activity   Walk up/down 4 steps assist level: Minimal Assistance - Patient > 75% Walk up/down 4 steps assistive device: 2 hand rails  Walk up/down 12 steps activity Walk up/down 12 steps activity did not occur: Safety/medical concerns      Pick up small objects from floor   Pick up small object from the floor assist level: Moderate Assistance - Patient 50 - 74% Pick up small object from the floor assistive device: RW  Wheelchair Is the patient using a wheelchair?: Yes Type of Wheelchair: Manual   Wheelchair assist level: Dependent - Patient 0%    Wheel 50 feet with 2 turns activity   Assist Level: Dependent - Patient 0%  Wheel 150 feet activity    Assist Level: Dependent - Patient 0%    Refer to Care Plan for Long Term Goals  SHORT TERM GOAL WEEK 1 PT Short Term Goal 1 (Week 1): = to LTGs based on ELOS  Recommendations for other services: Neuropsych and Therapeutic Recreation  Stress management and Outing/community reintegration  Skilled Therapeutic Intervention Pt received sitting in recliner and agreeable to therapy session despite reporting fatigue. Received and maintained on 2L of O2 via nasal cannula. Evaluation completed (see details above) with patient education regarding purpose of PT evaluation, PT POC and goals, therapy schedule, weekly team meetings, and other CIR information including safety plan and fall risk safety. Pt performed the below functional mobility tasks with the specified levels of skilled cuing and assistance. Pt demos impaired motor planning/unbalanced muscle activation in R LE during functional mobility often holding it in extended position during swing phase of gait and during stair navigation ascent. At end of session, pt left seated in recliner with needs in reach and chair alarm on.  Mobility Bed Mobility Bed Mobility: Supine to Sit;Sit to Supine Rolling Right: Supervision/verbal cueing Rolling Left: Supervision/Verbal cueing Supine to Sit: Minimal Assistance - Patient > 75% Sit to Supine: Minimal Assistance - Patient > 75% Transfers Transfers: Sit to Stand;Stand to Sit;Stand Pivot Transfers Sit to Stand: Contact Guard/Touching assist;Minimal Assistance - Patient > 75% Stand to Sit: Contact Guard/Touching assist;Minimal Assistance - Patient > 75% Stand Pivot Transfers: Contact Guard/Touching assist;Minimal Assistance - Patient > 75% Stand Pivot Transfer Details: Verbal cues for safe use of DME/AE;Verbal cues for technique;Verbal cues for sequencing;Tactile cues for weight shifting Transfer (Assistive device): Rolling walker Locomotion  Gait Ambulation: Yes Gait Assistance: Contact Guard/Touching  assist;Minimal Assistance - Patient > 75% Gait Distance (Feet): 103 Feet Assistive device: Rolling walker Gait Assistance Details: Verbal cues for safe use of DME/AE;Verbal cues for precautions/safety;Verbal cues for gait pattern;Verbal cues for technique Gait Gait: Yes Gait Pattern: Impaired Gait Pattern: Poor foot clearance - right;Step-through pattern;Decreased step length - right;Decreased stance time - right;Decreased hip/knee flexion - right (noticed gait deviations improve when pt distracted) Gait velocity: decreased Stairs / Additional Locomotion Stairs: Yes Stairs Assistance: Contact Guard/Touching assist;Minimal Assistance - Patient > 75% Stair Management Technique: Two rails;Forwards;Step to pattern (pt keeps R LE stiff in extension when lifting it to place onto next step, therapist facilitating increased hip/knee flexion for improved motor planning) Number of Stairs: 4 Height of Stairs: 6 Wheelchair Mobility Wheelchair Mobility: No   Discharge  Criteria: Patient will be discharged from PT if patient refuses treatment 3 consecutive times without medical reason, if treatment goals not met, if there is a change in medical status, if patient makes no progress towards goals or if patient is discharged from hospital.  The above assessment, treatment plan, treatment alternatives and goals were discussed and mutually agreed upon: by patient  Ginny Forth , PT, DPT, NCS, CSRS 06/17/2023, 3:26 PM

## 2023-06-17 NOTE — Progress Notes (Signed)
Inpatient Rehabilitation Care Coordinator Assessment and Plan Patient Details  Name: Luis [name] MRN: [id number] Date of Birth: 04-16-1971  Today's Date: 06/17/2023  Hospital Problems: Principal Problem:   Subcortical infarction [facility])  Past Medical History:  Past Medical History:  Diagnosis Date   Anxiety    Biventricular ICD (implantable cardioverter-defibrillator) in place    Chronic systolic CHF (congestive heart failure) (HCC)    Cocaine use    Diabetes mellitus without complication (HCC)    GERD (gastroesophageal reflux disease)    HLD (hyperlipidemia)    Hypertension    Morbid obesity (HCC)    NICM (nonischemic cardiomyopathy) (HCC)    OSA (obstructive sleep apnea)    PTSD (post-traumatic stress disorder)    on Depakote   Refusal of blood transfusions as patient is Jehovah's Witness    Tobacco abuse    Past Surgical History:  Past Surgical History:  Procedure Laterality Date   BIV ICD INSERTION CRT-D     FOOT FRACTURE SURGERY     ICD GENERATOR CHANGEOUT N/A 03/23/2022   Procedure: ICD GENERATOR CHANGEOUT;  Surgeon: Mealor, Roberts Gaudy, MD;  Location: [facility] INVASIVE CV LAB;  Service: Cardiovascular;  Laterality: N/A;   RIGHT HEART CATH N/A 03/06/2022   Procedure: RIGHT HEART CATH;  Surgeon: Orbie Pyo, MD;  Location: [facility] INVASIVE CV LAB;  Service: Cardiovascular;  Laterality: N/A;   RIGHT/LEFT HEART CATH AND CORONARY ANGIOGRAPHY N/A 04/16/2022   Procedure: RIGHT/LEFT HEART CATH AND CORONARY ANGIOGRAPHY;  Surgeon: Laurey Morale, MD;  Location: [facility] INVASIVE CV LAB;  Service: Cardiovascular;  Laterality: N/A;   ROTATOR CUFF REPAIR     TONSILLECTOMY     Social History:  reports that he has been smoking cigarettes. He started smoking about 16 years ago. He has a 7.5 pack-year smoking history. He has never used smokeless tobacco. He reports that he does not currently use alcohol. He reports current drug use. Drugs: Cocaine and Marijuana.  Family / Support  Systems Marital Status: Single Spouse/Significant Other: n/a Children: n/a Other Supports: Friend, Luis [name] Anticipated Caregiver: Luis [name], friend (provides caregiver assistance) Ability/Limitations of Caregiver: Min A Caregiver Availability: 24/7 Family Dynamics: support from caregiver  Social History Preferred language: English Religion: Baptist Cultural Background: n/a Education: n/a Primary school teacher - How often do you need to have someone help you when you read instructions, pamphlets, or other written material from your doctor or pharmacy?: Rarely Writes: Yes Employment Status: Disabled Date Retired/Disabled/Unemployed: n/a Marine scientist Issues: n/a Guardian/Conservator: n/a   Abuse/Neglect Abuse/Neglect Assessment Can Be Completed: Yes Physical Abuse: Denies Verbal Abuse: Denies Sexual Abuse: Denies Exploitation of patient/patient's resources: Denies Self-Neglect: Denies  Patient response to: Social Isolation - How often do you feel lonely or isolated from those around you?: Never  Emotional Status Pt's affect, behavior and adjustment status: Patient pleasant. Reading at bedside Recent Psychosocial Issues: Coping Psychiatric History: Hx of , PTSD Substance Abuse History: hx of cocaine and toabcco use  Patient / Family Perceptions, Expectations & Goals Pt/Family understanding of illness & functional limitations: yes Premorbid pt/family roles/activities: Living independent, requring assistance with tasks, ADLS, community,etc Anticipated changes in roles/activities/participation: Patient has a live in caregiver (barrieR: landlord needs letter to support caregiver) Pt/family expectations/goals: MOD I  Building surveyor: None Premorbid Home Care/DME Agencies: Other (Comment) Animal nutritionist, RW and Art gallery manager) Transportation available at discharge: Friend Is the patient able to respond to transportation needs?: Yes In the past 12  months, has lack of transportation kept you from  medical appointments or from getting medications?: No In the past 12 months, has lack of transportation kept you from meetings, work, or from getting things needed for daily living?: No Resource referrals recommended: Neuropsychology  Discharge Planning Living Arrangements: Alone Support Systems: Friends/neighbors Type of Residence: Private residence (Apartment, level entry) Insurance Resources: Media planner (specify), Medicaid (specify county) ([place] and [employer]) Surveyor, quantity Resources: Other (Comment) Financial Screen Referred: No Living Expenses: Rent Money Management: Patient Does the patient have any problems obtaining your medications?: No Home Management: Independent Patient/Family Preliminary Plans: Plans to continue to [facility] and meds independent Care Coordinator Barriers to Discharge: Lack of/limited family support, Decreased caregiver support Expected length of stay: 7-10 Days  Clinical Impression SW met with patient, introduced self and explained role. Patient living alone prior but anticipates discharging home with a live in caregiver (pt friend, Luis [name]). Patient has expressed that he will require a letter stating that patient will require 24/7 supervision at home due to his high fall risk to provide to his landlord. This will allow patient to have someone live in the home with him.  Patient has VA benefits and Medicaid. Pt currently has a Art gallery manager, rollator and shower seat. Patient will discharge to an level entry apartment. No additional questions or concerns. Sw will continue to follow up.   Luis [name] 06/17/2023, 10:48 AM

## 2023-06-17 NOTE — Progress Notes (Signed)
Inpatient Rehabilitation  Patient information reviewed and entered into eRehab system by Demarrio Menges Gentry, OTR/L, Rehab Quality Coordinator.   Information including medical coding, functional ability and quality indicators will be reviewed and updated through discharge.   

## 2023-06-17 NOTE — Progress Notes (Addendum)
Inpatient Rehabilitation Admission Medication Review by a Pharmacist  A complete drug regimen review was completed for this patient to identify any potential clinically significant medication issues.  High Risk Drug Classes Is patient taking? Indication by Medication  Antipsychotic No   Anticoagulant Yes Enoxaparin- VTE prophylaxis  Antibiotic No   Opioid Yes Hydrocodone/APAP-pain  Antiplatelet Yes Plavix-antiplatelet  Hypoglycemics/insulin Yes Insulin aspart SSI, insulin glargine-yfgn  Vasoactive Medication Yes Metoprolol succinate, Prazosin- HTN  Chemotherapy No   Other Yes Acetaminophen-pain  Bumetanide, digoxin, spironolactone, dapagliflozin - CHF Duloxetine,Hydroxyzine, trazodone- mood behavior Isoniazid- latent TB Pantoprazole- GERD Pyridoxine-Vit B6 supplement Rosuvastatin- HLD Senna-docusate- constipation     Type of Medication Issue Identified Description of Issue Recommendation(s)  Drug Interaction(s) (clinically significant)     Duplicate Therapy     Allergy     No Medication Administration End Date     Incorrect Dose     Additional Drug Therapy Needed     Significant med changes from prior encounter (inform family/care partners about these prior to discharge). PTA home meds: Abilify, Entresto, torsemide, coreg, Insulin 70/30- not continued  Restart PTA meds when and if necessary during CIR admission or at time of discharge, if warranted   Other  Kdur (potassium chloride)  Held on 06/16/23 due to hyperkalemia.  Rechecking K on CMP lab pending 06/17/23    Clinically significant medication issues were identified that warrant physician communication and completion of prescribed/recommended actions by midnight of the next day:    Name of provider notified for urgent issues identified: No  Provider Method of Notification:    Pharmacist comments:   Time spent performing this drug regimen review (minutes):  30   Noah Delaine, [place] Clinical  Pharmacist 06/17/2023 9:34 AM

## 2023-06-17 NOTE — Plan of Care (Signed)
Problem: RH Balance Goal: LTG: Patient will maintain dynamic sitting balance (OT) Description: LTG:  Patient will maintain dynamic sitting balance with assistance during activities of daily living (OT) Flowsheets (Taken 06/17/2023 1301) LTG: Pt will maintain dynamic sitting balance during ADLs with: Independent with assistive device Goal: LTG Patient will maintain dynamic standing with ADLs (OT) Description: LTG:  Patient will maintain dynamic standing balance with assist during activities of daily living (OT)  Flowsheets (Taken 06/17/2023 1301) LTG: Pt will maintain dynamic standing balance during ADLs with: Independent with assistive device   Problem: Sit to Stand Goal: LTG:  Patient will perform sit to stand in prep for activites of daily living with assistance level (OT) Description: LTG:  Patient will perform sit to stand in prep for activites of daily living with assistance level (OT) Flowsheets (Taken 06/17/2023 1301) LTG: PT will perform sit to stand in prep for activites of daily living with assistance level: Independent with assistive device   Problem: RH Grooming Goal: LTG Patient will perform grooming w/assist,cues/equip (OT) Description: LTG: Patient will perform grooming with assist, with/without cues using equipment (OT) Flowsheets (Taken 06/17/2023 1301) LTG: Pt will perform grooming with assistance level of: Independent with assistive device    Problem: RH Bathing Goal: LTG Patient will bathe all body parts with assist levels (OT) Description: LTG: Patient will bathe all body parts with assist levels (OT) Flowsheets (Taken 06/17/2023 1301) LTG: Pt will perform bathing with assistance level/cueing: Independent with assistive device    Problem: RH Dressing Goal: LTG Patient will perform upper body dressing (OT) Description: LTG Patient will perform upper body dressing with assist, with/without cues (OT). Flowsheets (Taken 06/17/2023 1301) LTG: Pt will perform upper  body dressing with assistance level of: Independent with assistive device Goal: LTG Patient will perform lower body dressing w/assist (OT) Description: LTG: Patient will perform lower body dressing with assist, with/without cues in positioning using equipment (OT) Flowsheets (Taken 06/17/2023 1301) LTG: Pt will perform lower body dressing with assistance level of: Independent with assistive device   Problem: RH Toileting Goal: LTG Patient will perform toileting task (3/3 steps) with assistance level (OT) Description: LTG: Patient will perform toileting task (3/3 steps) with assistance level (OT)  Flowsheets (Taken 06/17/2023 1301) LTG: Pt will perform toileting task (3/3 steps) with assistance level: Independent with assistive device   Problem: RH Functional Use of Upper Extremity Goal: LTG Patient will use RT/LT upper extremity as a (OT) Description: LTG: Patient will use right/left upper extremity as a stabilizer/gross assist/diminished/nondominant/dominant level with assist, with/without cues during functional activity (OT) Flowsheets (Taken 06/17/2023 1301) LTG: Use of upper extremity in functional activities: RUE as dominant level LTG: Pt will use upper extremity in functional activity with assistance level of: Supervision/Verbal cueing   Problem: RH Toilet Transfers Goal: LTG Patient will perform toilet transfers w/assist (OT) Description: LTG: Patient will perform toilet transfers with assist, with/without cues using equipment (OT) Flowsheets (Taken 06/17/2023 1301) LTG: Pt will perform toilet transfers with assistance level of: Independent with assistive device   Problem: RH Tub/Shower Transfers Goal: LTG Patient will perform tub/shower transfers w/assist (OT) Description: LTG: Patient will perform tub/shower transfers with assist, with/without cues using equipment (OT) Flowsheets (Taken 06/17/2023 1301) LTG: Pt will perform tub/shower stall transfers with assistance level of:  Independent with assistive device   Problem: RH Memory Goal: LTG Patient will demonstrate ability for day to day recall/carry over during activities of daily living with assistance level (OT) Description: LTG:  Patient will demonstrate ability for  day to day recall/carry over during activities of daily living with assistance level (OT). Flowsheets (Taken 06/17/2023 1301) LTG:  Patient will demonstrate ability for day to day recall/carry over during activities of daily living with assistance level (OT): Modified Independent

## 2023-06-17 NOTE — Progress Notes (Signed)
Inpatient Rehabilitation Center Individual Statement of Services  Patient Name:  Luis [name]  Date:  06/17/2023  Welcome to the Inpatient Rehabilitation Center.  Our goal is to provide you with an individualized program based on your diagnosis and situation, designed to meet your specific needs.  With this comprehensive rehabilitation program, you will be expected to participate in at least 3 hours of rehabilitation therapies Monday-Friday, with modified therapy programming on the weekends.  Your rehabilitation program will include the following services:  Physical Therapy (PT), Occupational Therapy (OT), Speech Therapy (ST), 24 hour per day rehabilitation nursing, Therapeutic Recreaction (TR), Neuropsychology, Care Coordinator, Rehabilitation Medicine, Nutrition Services, Pharmacy Services, and Other  Weekly team conferences will be held on Wednesdays to discuss your progress.  Your Inpatient Rehabilitation Care Coordinator will talk with you frequently to get your input and to update you on team discussions.  Team conferences with you and your family in attendance may also be held.  Expected length of stay: 7-10 Days  Overall anticipated outcome:  MOD I  Depending on your progress and recovery, your program may change. Your Inpatient Rehabilitation Care Coordinator will coordinate services and will keep you informed of any changes. Your Inpatient Rehabilitation Care Coordinator's name and contact numbers are listed  below.  The following services may also be recommended but are not provided by the Inpatient Rehabilitation Center:   Home Health Rehabiltiation Services Outpatient Rehabilitation Services    Arrangements will be made to provide these services after discharge if needed.  Arrangements include referral to agencies that provide these services.  Your insurance has been verified to be:   [place] Your primary doctor is:  [facility]  Pertinent information will be  shared with your doctor and your insurance company.  Inpatient Rehabilitation Care Coordinator:  Lavera Guise, [place] [phone] or [phone]  Information discussed with and copy given to patient by: [name], 06/17/2023, 8:03 AM

## 2023-06-18 ENCOUNTER — Encounter (HOSPITAL_COMMUNITY): Payer: Self-pay | Admitting: Physical Medicine & Rehabilitation

## 2023-06-18 DIAGNOSIS — I5042 Chronic combined systolic (congestive) and diastolic (congestive) heart failure: Secondary | ICD-10-CM | POA: Diagnosis not present

## 2023-06-18 DIAGNOSIS — J9610 Chronic respiratory failure, unspecified whether with hypoxia or hypercapnia: Secondary | ICD-10-CM | POA: Diagnosis not present

## 2023-06-18 DIAGNOSIS — I639 Cerebral infarction, unspecified: Secondary | ICD-10-CM | POA: Diagnosis not present

## 2023-06-18 LAB — GLUCOSE, CAPILLARY
Glucose-Capillary: 220 mg/dL — ABNORMAL HIGH (ref 70–99)
Glucose-Capillary: 224 mg/dL — ABNORMAL HIGH (ref 70–99)
Glucose-Capillary: 242 mg/dL — ABNORMAL HIGH (ref 70–99)
Glucose-Capillary: 281 mg/dL — ABNORMAL HIGH (ref 70–99)

## 2023-06-18 LAB — BASIC METABOLIC PANEL
Anion gap: 13 (ref 5–15)
BUN: 21 mg/dL — ABNORMAL HIGH (ref 6–20)
CO2: 25 mmol/L (ref 22–32)
Calcium: 8.9 mg/dL (ref 8.9–10.3)
Chloride: 96 mmol/L — ABNORMAL LOW (ref 98–111)
Creatinine, Ser: 1.06 mg/dL (ref 0.61–1.24)
GFR, Estimated: >60 mL/min (ref >60)
Glucose, Bld: 318 mg/dL — ABNORMAL HIGH (ref 70–99)
Potassium: 3.8 mmol/L (ref 3.5–5.1)
Sodium: 134 mmol/L — ABNORMAL LOW (ref 135–145)

## 2023-06-18 NOTE — Progress Notes (Signed)
Physical Therapy Session Note  Patient Details  Name: Luis [name] MRN: [id number] Date of Birth: 29-Jan-1971  Today's Date: 06/18/2023 PT Individual Time: 1005-1100 PT Individual Time Calculation (min): 55 min   Short Term Goals: Week 1:  PT Short Term Goal 1 (Week 1): = to LTGs based on ELOS  Skilled Therapeutic Interventions/Progress Updates:    Pt received in recliner and agreeable to therapy.  Pt reports 10/10 headache pain, nsg provided pain medication during session. Pt also reports R LE soreness during session, correlates with muscle activation/soreness, rest as needed.   Pt maintained on 2L O2 throughout session, with therapist managing O2 tank. Sit to stand with CGA/min a with RW. Pt ambulated x 60 ft and x 110 ft with RW and CGA. Note decr R stance time and consistent circumduction with knee extended.  Pt then participated in step taps with verbal and visual cues to increase hip flexion and knee flexion, as pt defaults to circumduction with extended knee all the way onto step. When pt cued to look at LE, demoed mild improvement in hip/knee flexion, but continued circumduction.  Pt then propelled w/c with BLE for increased hamstring activation. On ambulatory transfer to recliner in room, pt able to demo increased foot clearance with more normalized gait pattern. Pt returned to recliner, was left with all needs in reach and alarm active.     Therapy Documentation Precautions:  Precautions Precautions: Fall, Other (comment) Precaution Comments: 2L of O2 via nasal cannula baseline (pt reports using it PRN), mild R hemi (LE>UE) Restrictions Weight Bearing Restrictions Per Provider Order: No General:       Therapy/Group: Individual Therapy  Juluis Rainier 06/18/2023, 10:56 AM

## 2023-06-18 NOTE — Progress Notes (Signed)
Patient ID: Luis [name], male   DOB: 1971/01/29, 52 y.o.   MRN: [id number]   Letter to support 24/7 supervision in the home written. Sw will print and have physician sign once d/c date is confirmed.

## 2023-06-18 NOTE — Progress Notes (Signed)
Occupational Therapy Session Note  Patient Details  Name: Luis [name] MRN: [id number] Date of Birth: 10/19/1970  Today's Date: 06/18/2023 OT Missed Time: 75 Minutes Missed Time Reason: Patient fatigue;Patient unwilling/refused to participate without medical reason   Short Term Goals: Week 1:  OT Short Term Goal 1 (Week 1): STG=LTG d/t ELOS  Skilled Therapeutic Interventions/Progress Updates:     Attempted to see Pt for regularly scheduled OT session. Pt sleeping upon OT arrival, waking to OT calling out Pt's name. Pt presenting to be flustered that OT had awoken him stating "I am not doing any therapy; I did not sleep well and do not feel good". Provided therapeutic support and education on purpose of therapy, however Pt continued to decline. Missed 30 minutes of skilled OT treatment, will attempt to make up time as schedule allows.   Therapy Documentation Precautions:  Precautions Precautions: Fall, Other (comment) Precaution Comments: 2L of O2 via nasal cannula baseline (pt reports using it PRN), mild R hemi (LE>UE) Restrictions Weight Bearing Restrictions Per Provider Order: No  Therapy/Group: Individual Therapy  Clide Deutscher 06/18/2023, 8:12 PM

## 2023-06-18 NOTE — Progress Notes (Addendum)
PROGRESS NOTE   Subjective/Complaints:  No new issues but slept poorly but no specific c/o , had fatigue during therapy   Review of systems negative chest pain, shortness of breath, nausea vomiting diarrhea  Objective:   No results found. Recent Labs    06/16/23 1824  WBC 9.1  HGB 14.3  HCT 44.7  PLT 273   Recent Labs    06/16/23 1137 06/16/23 1824  NA 136  --   K 5.2*  --   CL 103  --   CO2 19*  --   GLUCOSE 277*  --   BUN 18  --   CREATININE 1.47* 1.30*  CALCIUM 9.2  --     Intake/Output Summary (Last 24 hours) at 06/18/2023 1610 Last data filed at 06/18/2023 0744 Gross per 24 hour  Intake 1772 ml  Output 2775 ml  Net -1003 ml        Physical Exam: Vital Signs Blood pressure 129/68, pulse 79, temperature 98 F (36.7 C), resp. rate 18, height 5\' 6"  (1.676 m), weight (!) 141.7 kg, SpO2 97%.   General: No acute distress Mood and affect are appropriate Heart: Regular rate and rhythm no rubs murmurs or extra sounds Lungs: Clear to auscultation, breathing unlabored, no rales or wheezes Abdomen: Positive bowel sounds, soft nontender to palpation, nondistended Extremities: No clubbing, cyanosis, or edema Skin: No evidence of breakdown, no evidence of rash Neurologic: Cranial nerves II through XII intact, motor strength is 5/5 in left and 4/5 right deltoid, bicep, tricep, grip, hip flexor, knee extensors, ankle dorsiflexor and plantar flexor Sensory exam normal sensation to light touch and proprioception in bilateral upper and lower extremities Cerebellar exam normal finger to nose to finger as well as heel to shin in bilateral upper and lower extremities Musculoskeletal: Full range of motion in all 4 extremities. No joint swelling   Assessment/Plan: 1. Functional deficits which require 3+ hours per day of interdisciplinary therapy in a comprehensive inpatient rehab setting. Physiatrist is providing  close team supervision and 24 hour management of active medical problems listed below. Physiatrist and rehab team continue to assess barriers to discharge/monitor patient progress toward functional and medical goals  Care Tool:  Bathing    Body parts bathed by patient: Right arm, Left arm, Chest, Abdomen, Front perineal area, Buttocks, Right upper leg, Left upper leg, Right lower leg, Left lower leg, Face         Bathing assist Assist Level: Contact Guard/Touching assist     Upper Body Dressing/Undressing Upper body dressing   What is the patient wearing?: Pull over shirt    Upper body assist Assist Level: Supervision/Verbal cueing    Lower Body Dressing/Undressing Lower body dressing      What is the patient wearing?: Pants, Underwear/pull up     Lower body assist Assist for lower body dressing: Minimal Assistance - Patient > 75%     Toileting Toileting    Toileting assist Assist for toileting: Minimal Assistance - Patient > 75%     Transfers Chair/bed transfer  Transfers assist     Chair/bed transfer assist level: Minimal Assistance - Patient > 75%     Locomotion Ambulation  Ambulation assist      Assist level: Minimal Assistance - Patient > 75% Assistive device: Walker-rolling Max distance: 179ft   Walk 10 feet activity   Assist     Assist level: Minimal Assistance - Patient > 75% Assistive device: Walker-rolling   Walk 50 feet activity   Assist    Assist level: Minimal Assistance - Patient > 75% Assistive device: Walker-rolling    Walk 150 feet activity   Assist Walk 150 feet activity did not occur: Safety/medical concerns         Walk 10 feet on uneven surface  activity   Assist     Assist level: Minimal Assistance - Patient > 75% Assistive device: Walker-rolling   Wheelchair     Assist Is the patient using a wheelchair?: Yes Type of Wheelchair: Manual    Wheelchair assist level: Dependent - Patient 0%       Wheelchair 50 feet with 2 turns activity    Assist        Assist Level: Dependent - Patient 0%   Wheelchair 150 feet activity     Assist      Assist Level: Dependent - Patient 0%   Blood pressure 129/68, pulse 79, temperature 98 F (36.7 C), resp. rate 18, height 5\' 6"  (1.676 m), weight (!) 141.7 kg, SpO2 97%.  Medical Problem List and Plan: 1. 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             -patient may shower             -ELOS/Goals: 7 days             Admit to CIR 2.  Antithrombotics: -DVT/anticoagulation:  Pharmaceutical: Lovenox             -antiplatelet therapy: Plavix 75 mg daily.  Patient is allergic to aspirin 3. Pain Management: Hydrocodone 1 tablet every 4 hours as needed 4. Mood/Behavior/Sleep: Cymbalta 30 mg daily, trazodone 200 mg nightly, Atarax as needed             -antipsychotic agents: N/A 5. Neuropsych/cognition: This patient is capable of making decisions on his own behalf. 6. Skin/Wound Care: Routine skin checks 7. Fluids/Electrolytes/Nutrition: Routine in and outs with follow-up chemistries 8.  Acute/chronic respiratory failure.  Monitor oxygen saturations 9.  OSA.  CPAP 10.  Morbid obesity.  BMI 50.75.  Dietary follow-up 11.  Poorly controlled diabetes mellitus.  Hemoglobin A1c 12. Increase  Semglee 20 units daily, NovoLog 15 units 3 times daily.  Diabetic teaching CBG (last 3)  Recent Labs    06/17/23 1635 06/17/23 2057 06/18/23 0612  GLUCAP 259* 327* 220*  Uncontrolled increasing Semglee 12/20  12.  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 Farxiga 10 mg daily, Lanoxin 0.125 mg daily, Bumex 2 mg twice daily, Aldactone 25 mg daily.  Cardiology consulted to adjust GDMT and ensure f/u , not sure if pt may need Heart failure team to manageMonitor for any signs of fluid overload. Daily  weights ordered 13.  Hypertension. Continue Toprol-XL 150 mg daily, Minipress 2 mg nightly.  Monitor with increased mobility 14.  Hyperlipidemia.  Continue Crestor 15.  GERD.  Continue Protonix 16.  History of tobacco as well as polysubstance abuse.  Counseling  17.  Mild Hyper K+ likely due to aldactone monitor with BMET on 12/21, increased from 3.6 on 12/17--> 5.2 on 12/18 On aldactone  will repeat today .  Cardiology consulted to adjust GDMT LOS: 2 days A FACE TO FACE EVALUATION WAS PERFORMED  Erick Colace 06/18/2023, 8:12 AM

## 2023-06-18 NOTE — Consult Note (Cosign Needed Addendum)
Cardiology Consultation   Patient ID: Luis [name] MRN: [id number]; DOB: 06-21-1971  Admit date: 06/16/2023 Date of Consult: 06/18/2023  PCP:  Clinic, [facility] Providers Cardiologist:  Bryan Lemma, MD  Electrophysiologist:  Maurice Small, MD    Patient Profile:   Luis [name] is a 52 y.o. male with a hx of chronic systolic heart failure, VT status post ICD (MDT), hypertension, hyperlipidemia, diabetes, OSA on CPAP, substance abuse, Jehovah's Witness who is being seen 06/18/2023 for the evaluation of CHF, meds at the request of Dr. Baxter Hire.  History of Present Illness:   Luis [name] is a 52 year old male with past medical history noted above.  He has been followed by Dr. Herbie Baltimore as well as Dr. Shirlee Latch and Dr. Shirlyn Goltz as an outpatient.  He has a history of chronic systolic heart failure, along with VT with a [employer] ICD.  He was admitted 02/2022 with acute on chronic CHF and device was noted to be at [facility].  Echocardiogram showed LVEF of 25%, global hypokinesis, mildly reduced RV.  He was diuresed with IV Lasix and underwent right heart cath showing normal filling pressures with reduced cardiac output.  He was placed on GDMT and transition to p.o. torsemide.  Underwent GEN change 9/25 prior to discharge.  Last seen in the heart failure clinic on 04/2022 for follow-up and overall was doing well.  He continue to wear 2 L of oxygen and was able to walk into the clinic from the parking lot without having to stop and rest.  He was continued on Entresto 97/103, torsemide 60 mg daily, Spyro 25 mg daily, Farxiga 10 mg daily, Coreg 3.125 mg twice daily, digoxin 0.125 mg daily.   He was admitted to [facility] 12/14 with complaints of right-sided weakness.  Head CT was negative, carotid Dopplers were negative.  Echocardiogram showed LVEF of 25 to 30%, global hypokinesis, grade 1 diastolic dysfunction, mildly reduced RV.  MRI with no acute  abnormality, moderate narrowing in A1 segment of the right ACA.  Neurology was consulted and change from Plavix to Brilinta 90 mg twice daily.  He was ultimately discharged to inpatient rehab on 12/18.  Patient is currently working with PT and speech therapy.  Cardiology has been asked to evaluate the patient today in regards to his heart failure medications.   In talking with patient he reports getting up and working with physical therapy today.  Felt more tired but also pushed himself harder.  Complained of a headache.  States he has felt "puffy" in his legs the past couple days as well.  Although edema improves whenever he elevates his legs.  Of note labs on 12/18 showed hyperkalemia with a potassium of 5.2, Cr 1.47. Repeat BMET today K 3.8, Cr 1.06.   Past Medical History:  Diagnosis Date   Anxiety    Biventricular ICD (implantable cardioverter-defibrillator) in place    Chronic systolic CHF (congestive heart failure) (HCC)    Cocaine use    Diabetes mellitus without complication (HCC)    GERD (gastroesophageal reflux disease)    HLD (hyperlipidemia)    Hypertension    Morbid obesity (HCC)    NICM (nonischemic cardiomyopathy) (HCC)    OSA (obstructive sleep apnea)    PTSD (post-traumatic stress disorder)    on Depakote   Refusal of blood transfusions as patient is Jehovah's Witness    Tobacco abuse     Past Surgical History:  Procedure Laterality Date   BIV  ICD INSERTION CRT-D     FOOT FRACTURE SURGERY     ICD GENERATOR CHANGEOUT N/A 03/23/2022   Procedure: ICD GENERATOR CHANGEOUT;  Surgeon: Nelly Laurence Monchel Pollitt Gaudy, MD;  Location[facility] INVASIVE CV LAB;  Service: Cardiovascular;  Laterality: N/A;   RIGHT HEART CATH N/A 03/06/2022   Procedure: RIGHT HEART CATH;  Surgeon: Orbie Pyo, MD;  Location: [facility] INVASIVE CV LAB;  Service: Cardiovascular;  Laterality: N/A;   RIGHT/LEFT HEART CATH AND CORONARY ANGIOGRAPHY N/A 04/16/2022   Procedure: RIGHT/LEFT HEART CATH AND CORONARY ANGIOGRAPHY;   Surgeon: Laurey Morale, MD;  Location: [facility] INVASIVE CV LAB;  Service: Cardiovascular;  Laterality: N/A;   ROTATOR CUFF REPAIR     TONSILLECTOMY         Inpatient Medications: Scheduled Meds:  bumetanide  2 mg Oral BID   clopidogrel  75 mg Oral Daily   dapagliflozin propanediol  10 mg Oral Daily   digoxin  0.125 mg Oral Daily   DULoxetine  30 mg Oral Daily   enoxaparin (LOVENOX) injection  40 mg Subcutaneous Q24H   insulin aspart  0-20 Units Subcutaneous TID PC & HS   insulin aspart  15 Units Subcutaneous TID WC   insulin glargine-yfgn  50 Units Subcutaneous QHS   isoniazid  300 mg Oral Daily   metoprolol succinate  150 mg Oral Daily   nystatin cream   Topical BID   pantoprazole  40 mg Oral Daily   prazosin  2 mg Oral QHS   pyridOXINE  100 mg Oral Daily   rosuvastatin  20 mg Oral Daily   spironolactone  25 mg Oral Daily   trazodone  300 mg Oral QHS   Continuous Infusions:  PRN Meds: acetaminophen **OR** acetaminophen (TYLENOL) oral liquid 160 mg/5 mL **OR** acetaminophen, HYDROcodone-acetaminophen, hydrOXYzine, senna-docusate  Allergies:    Allergies  Allergen Reactions   Aspirin Anaphylaxis and Other (See Comments)    Throat closing    Iodine-131 Hives   Peanut (Diagnostic) Anaphylaxis    Throat closes   Iodinated Contrast Media Hives    Can be premedicated    Latex Hives and Rash   Penicillins Nausea And Vomiting   Ultram [Tramadol] Hives and Nausea Only   Zestril [Lisinopril] Cough   Cinnamon    Amoxicillin-Pot Clavulanate Diarrhea   Lidocaine Rash    Rash from adhesive on lidocaine patches     Social History:   Social History   Socioeconomic History   Marital status: Widowed    Spouse name: Not on file   Number of children: 7   Years of education: Not on file   Highest education level: Bachelor's degree (e.g., BA, AB, BS)  Occupational History   Occupation: disability  Tobacco Use   Smoking status: Every Day    Current packs/day: 0.00     Average packs/day: 0.5 packs/day for 15.0 years (7.5 ttl pk-yrs)    Types: Cigarettes    Start date: 10/2006    Last attempt to quit: 10/2021    Years since quitting: 1.6   Smokeless tobacco: Never   Tobacco comments:    occasionally  Vaping Use   Vaping status: Never Used  Substance and Sexual Activity   Alcohol use: Not Currently   Drug use: Yes    Types: Cocaine, Marijuana    Comment: using in binges, 1/4gm daily x 2 weeks.   Sexual activity: Not Currently  Other Topics Concern   Not on file  Social History Narrative   Not on file  Social Drivers of Corporate investment banker Strain: High Risk (11/16/2022)   Overall Financial Resource Strain (CARDIA)    Difficulty of Paying Living Expenses: Hard  Food Insecurity: No Food Insecurity (06/13/2023)   Hunger Vital Sign    Worried About Running Out of Food in the Last Year: Never true    Ran Out of Food in the Last Year: Never true  Transportation Needs: Unmet Transportation Needs (06/13/2023)   PRAPARE - Administrator, Civil Service (Medical): Yes    Lack of Transportation (Non-Medical): Yes  Physical Activity: Not on file  Stress: No Stress Concern Present (04/25/2023)   Received from [facility] - Occupational Stress Questionnaire    Feeling of Stress : Not at all  Social Connections: Unknown (08/26/2022)   Received from [facility]   Social Network    Social Network: Not on file  Intimate Partner Violence: Not At Risk (06/13/2023)   Humiliation, Afraid, Rape, and Kick questionnaire    Fear of Current or Ex-Partner: No    Emotionally Abused: No    Physically Abused: No    Sexually Abused: No    Family History:    Family History  Problem Relation Age of Onset   Hypertension Mother    Bone cancer Father    Lung cancer Father      ROS:  Please see the history of present illness.   All other ROS reviewed and negative.     Physical  Exam/Data:   Vitals:   06/17/23 1254 06/17/23 2025 06/18/23 0411 06/18/23 1236  BP: (!) 142/61 (!) 145/72 129/68 (!) 154/77  Pulse: 72 72 79 68  Resp: 19 17 18 17   Temp: 97.6 F (36.4 C) 98.5 F (36.9 C) 98 F (36.7 C) 98.8 F (37.1 C)  TempSrc: Oral   Oral  SpO2: 96% 97% 97% 97%  Weight:      Height:        Intake/Output Summary (Last 24 hours) at 06/18/2023 1753 Last data filed at 06/18/2023 1736 Gross per 24 hour  Intake 1180 ml  Output 3575 ml  Net -2395 ml      06/16/2023    5:50 PM 06/12/2023    3:29 PM 12/07/2022    1:50 AM  Last 3 Weights  Weight (lbs) 312 lb 6.3 oz 305 lb   Weight (kg) 141.7 kg 138.347 kg      Information is confidential and restricted. Go to Review Flowsheets to unlock data.     Body mass index is 50.42 kg/m.  General: Obese male HEENT: normal Neck: no JVD Vascular: No carotid bruits; Distal pulses 2+ bilaterally Cardiac:  normal S1, S2; RRR; no murmur  Lungs:  clear to auscultation bilaterally, no wheezing, rhonchi or rales  Abd: soft, nontender, no hepatomegaly  Ext: no edema Musculoskeletal:  No deformities, BUE and BLE strength normal and equal Skin: warm and dry  Neuro:  CNs 2-12 intact, no focal abnormalities noted Psych:  Normal affect   EKG:  The EKG was personally reviewed and demonstrates:  N/a  Telemetry:  Telemetry was personally reviewed and demonstrates:  N/a   Relevant CV Studies:  Echo: 06/14/2023  IMPRESSIONS     1. Left ventricular ejection fraction, by estimation, is 25 to 30%. The  left ventricle has normal function. The left ventricle demonstrates global  hypokinesis. Left ventricular diastolic parameters are consistent with  Grade I diastolic dysfunction  (impaired relaxation).  2. Right ventricular systolic function is mildly reduced. The right  ventricular size is normal. Tricuspid regurgitation signal is inadequate  for assessing PA pressure.   3. The mitral valve is normal in structure. No  evidence of mitral valve  regurgitation.   4. The aortic valve is tricuspid. Aortic valve regurgitation is not  visualized. No aortic stenosis is present.   5. The inferior vena cava is normal in size with greater than 50%  respiratory variability, suggesting right atrial pressure of 3 mmHg.   FINDINGS   Left Ventricle: Left ventricular ejection fraction, by estimation, is 25  to 30%. The left ventricle has normal function. The left ventricle  demonstrates global hypokinesis. Definity contrast agent was given IV to  delineate the left ventricular  endocardial borders. The left ventricular internal cavity size was normal  in size. There is no left ventricular hypertrophy. Left ventricular  diastolic parameters are consistent with Grade I diastolic dysfunction  (impaired relaxation).   Right Ventricle: The right ventricular size is normal. No increase in  right ventricular wall thickness. Right ventricular systolic function is  mildly reduced. Tricuspid regurgitation signal is inadequate for assessing  PA pressure.   Left Atrium: Left atrial size was normal in size.   Right Atrium: Right atrial size was normal in size.   Pericardium: There is no evidence of pericardial effusion.   Mitral Valve: The mitral valve is normal in structure. No evidence of  mitral valve regurgitation.   Tricuspid Valve: The tricuspid valve is normal in structure. Tricuspid  valve regurgitation is not demonstrated.   Aortic Valve: The aortic valve is tricuspid. Aortic valve regurgitation is  not visualized. No aortic stenosis is present. Aortic valve mean gradient  measures 2.0 mmHg. Aortic valve peak gradient measures 3.7 mmHg.   Pulmonic Valve: The pulmonic valve was normal in structure. Pulmonic valve  regurgitation is not visualized.   Aorta: The aortic root is normal in size and structure.   Venous: The inferior vena cava is normal in size with greater than 50%  respiratory variability,  suggesting right atrial pressure of 3 mmHg.   IAS/Shunts: No atrial level shunt detected by color flow Doppler.   Additional Comments: A device lead is visualized in the right ventricle.    Laboratory Data:  High Sensitivity Troponin:   Recent Labs  Lab 06/12/23 1715 06/12/23 1840  TROPONINIHS 15 17     Chemistry Recent Labs  Lab 06/15/23 0515 06/16/23 1137 06/16/23 1824 06/18/23 0907  NA 133* 136  --  134*  K 3.6 5.2*  --  3.8  CL 101 103  --  96*  CO2 24 19*  --  25  GLUCOSE 287* 277*  --  318*  BUN 15 18  --  21*  CREATININE 0.86 1.47* 1.30* 1.06  CALCIUM 8.7* 9.2  --  8.9  GFRNONAA >60 56* >60 >60  ANIONGAP 8 14  --  13    Recent Labs  Lab 06/12/23 1539 06/14/23 0813  PROT 6.4* 6.1*  ALBUMIN 3.2* 3.1*  AST 19 20  ALT 18 19  ALKPHOS 67 48  BILITOT 0.5 0.5   Lipids  Recent Labs  Lab 06/13/23 0443  CHOL 160  TRIG 211*  HDL 46  LDLCALC 72  CHOLHDL 3.5    Hematology Recent Labs  Lab 06/14/23 0813 06/15/23 0515 06/16/23 1824  WBC 7.4 7.9 9.1  RBC 4.80 4.50 5.12  HGB 13.7 12.9* 14.3  HCT 42.2 39.4 44.7  MCV 87.9  87.6 87.3  MCH 28.5 28.7 27.9  MCHC 32.5 32.7 32.0  RDW 12.7 13.0 12.9  PLT 245 235 273   Thyroid No results for input(s): "TSH", "FREET4" in the last 168 hours.  BNP Recent Labs  Lab 06/12/23 1715  BNP 118.4*    DDimer No results for input(s): "DDIMER" in the last 168 hours.   Radiology/Studies:  No results found.   Assessment and Plan:   Luis [name] is a 52 y.o. male with a hx of chronic systolic heart failure, VT status post ICD (MDT), hypertension, hyperlipidemia, diabetes, OSA on CPAP, substance abuse, Jehovah's Witness who is being seen 06/18/2023 for the evaluation of CHF, meds at the request of Dr. Baxter Hire.  Chronic combined CHF NICM -- Known history of LVEF in the 25-30 range with mildly reduced RV -- Overall he seems to be progressing well with PT, does report increased fatigue today but pushed  himself harder. -- Vital signs are stable -- GDMT: Continue metoprolol XL 150 mg daily, Farxiga 10 mg daily, spironolactone 25 mg daily, Bumex 2 mg twice daily, digoxin 0.125 mg daily  History of VT s/p ICD -- Follows with the EP as an outpatient, Dr. Nelly Laurence  Hypertension -- Overall blood pressures reasonably controlled -- Continue Toprol-XL 150 mg daily, Farxiga 10 mg daily, spironolactone 25 mg daily, Minipress 2 mg daily  HLD -- Continue Crestor  Hyperkalemia -- K+ 5.2 on 12/18, improved to 3.8 on recheck -- spot check BMET while in CIR  Will send message to the AHF clinic to ensure follow up arranged  For questions or updates, please contact [facility] Please consult [email or web address] for contact info under   Signed, Laverda Page, NP  06/18/2023 5:53 PM

## 2023-06-18 NOTE — Progress Notes (Signed)
Speech Language Pathology Daily Session Note  Patient Details  Name: Luis [name] MRN: [id number] Date of Birth: 09-23-1970  Today's Date: 06/18/2023 SLP Individual Time: 0900-1000 SLP Individual Time Calculation (min): 60 min  Short Term Goals: Week 1: SLP Short Term Goal 1 (Week 1): Stg=ltg due to elos  Skilled Therapeutic Interventions: Skilled therapy session focused on cognitive and dysphagia goals. SLP faciliated session by reviewing WRAP (write, repeat, associate, picture) memory strategies and providing supervision A for patient to recall examples of each. SLP continued to address memory through paragraph comprehension task. Patient utilized memory strategies to answer paragraph comprehension questions with 90% given supervision A.  Dysphagia goals were addressed through patient consumption of thin liquids. Patient with no s/sx of aspiration throughout. Recommend continuation of current diet and intermittent supervision with standardized swallowing precautions and lingual sweep. Patient left in chair with alarm set and call bell in reach. Continue POC.   Pain Headache - RN aware  Therapy/Group: Individual Therapy  Ark Agrusa M.A., CF-SLP 06/18/2023, 7:37 AM

## 2023-06-19 DIAGNOSIS — I5042 Chronic combined systolic (congestive) and diastolic (congestive) heart failure: Secondary | ICD-10-CM | POA: Diagnosis not present

## 2023-06-19 DIAGNOSIS — J9610 Chronic respiratory failure, unspecified whether with hypoxia or hypercapnia: Secondary | ICD-10-CM | POA: Diagnosis not present

## 2023-06-19 DIAGNOSIS — I639 Cerebral infarction, unspecified: Secondary | ICD-10-CM | POA: Diagnosis not present

## 2023-06-19 LAB — GLUCOSE, CAPILLARY
Glucose-Capillary: 260 mg/dL — ABNORMAL HIGH (ref 70–99)
Glucose-Capillary: 187 mg/dL — ABNORMAL HIGH (ref 70–99)
Glucose-Capillary: 141 mg/dL — ABNORMAL HIGH (ref 70–99)
Glucose-Capillary: 231 mg/dL — ABNORMAL HIGH (ref 70–99)

## 2023-06-19 MED ORDER — INSULIN GLARGINE-YFGN 100 UNIT/ML ~~LOC~~ SOLN
55.0000 [IU] | Freq: Every day | SUBCUTANEOUS | Status: DC
  Administered 2023-06-19: 55 [IU] via SUBCUTANEOUS
  Filled 2023-06-19 (×2): qty 0.55

## 2023-06-19 MED ORDER — OXYMETAZOLINE HCL 0.05 % NA SOLN
1.0000 | Freq: Two times a day (BID) | NASAL | Status: AC
  Administered 2023-06-19 – 2023-06-21 (×6): 1 via NASAL
  Filled 2023-06-19: qty 30

## 2023-06-19 NOTE — Progress Notes (Signed)
PROGRESS NOTE   Subjective/Complaints:  Appreciate cardiology consult , labs reviewed , left ear still feels full, uses afrin at home for sinuses TM clear per exam yesterday , no ceruminosis   Review of systems negative chest pain, shortness of breath, nausea vomiting diarrhea  Objective:   No results found. Recent Labs    06/16/23 1824  WBC 9.1  HGB 14.3  HCT 44.7  PLT 273   Recent Labs    06/16/23 1137 06/16/23 1824 06/18/23 0907  NA 136  --  134*  K 5.2*  --  3.8  CL 103  --  96*  CO2 19*  --  25  GLUCOSE 277*  --  318*  BUN 18  --  21*  CREATININE 1.47* 1.30* 1.06  CALCIUM 9.2  --  8.9    Intake/Output Summary (Last 24 hours) at 06/19/2023 0810 Last data filed at 06/19/2023 0739 Gross per 24 hour  Intake 1256 ml  Output 4645 ml  Net -3389 ml        Physical Exam: Vital Signs Blood pressure (!) 119/44, pulse 81, temperature 98.1 F (36.7 C), resp. rate 18, height 5\' 6"  (1.676 m), weight (!) 141.7 kg, SpO2 93%.   General: No acute distress Mood and affect are appropriate Heart: Regular rate and rhythm no rubs murmurs or extra sounds Lungs: Clear to auscultation, breathing unlabored, no rales or wheezes Abdomen: Positive bowel sounds, soft nontender to palpation, nondistended Extremities: No clubbing, cyanosis, or edema Skin: No evidence of breakdown, no evidence of rash Neurologic: Cranial nerves II through XII intact, motor strength is 5/5 in left and 4/5 right deltoid, bicep, tricep, grip, hip flexor, knee extensors, ankle dorsiflexor and plantar flexor Sensory exam normal sensation to light touch and proprioception in bilateral upper and lower extremities Cerebellar exam normal finger to nose to finger as well as heel to shin in bilateral upper and lower extremities Musculoskeletal: Full range of motion in all 4 extremities. No joint swelling   Assessment/Plan: 1. Functional deficits which  require 3+ hours per day of interdisciplinary therapy in a comprehensive inpatient rehab setting. Physiatrist is providing close team supervision and 24 hour management of active medical problems listed below. Physiatrist and rehab team continue to assess barriers to discharge/monitor patient progress toward functional and medical goals  Care Tool:  Bathing    Body parts bathed by patient: Right arm, Left arm, Chest, Abdomen, Front perineal area, Buttocks, Right upper leg, Left upper leg, Right lower leg, Left lower leg, Face         Bathing assist Assist Level: Contact Guard/Touching assist     Upper Body Dressing/Undressing Upper body dressing   What is the patient wearing?: Pull over shirt    Upper body assist Assist Level: Supervision/Verbal cueing    Lower Body Dressing/Undressing Lower body dressing      What is the patient wearing?: Pants, Underwear/pull up     Lower body assist Assist for lower body dressing: Minimal Assistance - Patient > 75%     Toileting Toileting    Toileting assist Assist for toileting: Minimal Assistance - Patient > 75%     Transfers Chair/bed transfer  Transfers  assist     Chair/bed transfer assist level: Minimal Assistance - Patient > 75%     Locomotion Ambulation   Ambulation assist      Assist level: Minimal Assistance - Patient > 75% Assistive device: Walker-rolling Max distance: 175ft   Walk 10 feet activity   Assist     Assist level: Minimal Assistance - Patient > 75% Assistive device: Walker-rolling   Walk 50 feet activity   Assist    Assist level: Minimal Assistance - Patient > 75% Assistive device: Walker-rolling    Walk 150 feet activity   Assist Walk 150 feet activity did not occur: Safety/medical concerns         Walk 10 feet on uneven surface  activity   Assist     Assist level: Minimal Assistance - Patient > 75% Assistive device: Walker-rolling   Wheelchair     Assist Is  the patient using a wheelchair?: Yes Type of Wheelchair: Manual    Wheelchair assist level: Dependent - Patient 0%      Wheelchair 50 feet with 2 turns activity    Assist        Assist Level: Dependent - Patient 0%   Wheelchair 150 feet activity     Assist      Assist Level: Dependent - Patient 0%   Blood pressure (!) 119/44, pulse 81, temperature 98.1 F (36.7 C), resp. rate 18, height 5\' 6"  (1.676 m), weight (!) 141.7 kg, SpO2 93%.  Medical Problem List and Plan: 1. 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             -patient may shower             -ELOS/Goals: 7 days             Admit to CIR 2.  Antithrombotics: -DVT/anticoagulation:  Pharmaceutical: Lovenox             -antiplatelet therapy: Plavix 75 mg daily.  Patient is allergic to aspirin 3. Pain Management: Hydrocodone 1 tablet every 4 hours as needed 4. Mood/Behavior/Sleep: Cymbalta 30 mg daily, trazodone 200 mg nightly, Atarax as needed             -antipsychotic agents: N/A 5. Neuropsych/cognition: This patient is capable of making decisions on his own behalf. 6. Skin/Wound Care: Routine skin checks 7. Fluids/Electrolytes/Nutrition: Routine in and outs with follow-up chemistries 8.  Acute/chronic respiratory failure.  Monitor oxygen saturations 9.  OSA.  CPAP 10.  Morbid obesity.  BMI 50.75.  Dietary follow-up 11.  Poorly controlled diabetes mellitus.  Hemoglobin A1c 12. Increase  Semglee 50 units daily, NovoLog 15 units 3 times daily.  Diabetic teaching CBG (last 3)  Recent Labs    06/18/23 1650 06/18/23 2034 06/19/23 0551  GLUCAP 242* 281* 260*  Uncontrolled increasing Semglee 12/21 to 55U  12.  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 Farxiga 10 mg daily, Lanoxin 0.125 mg daily, Bumex 2 mg twice daily, Aldactone 25 mg daily.  Cardiology  consulted to adjust GDMT and ensure f/u , not sure if pt may need Heart failure team to manageMonitor for any signs of fluid overload. Daily weights ordered 13.  Hypertension. Continue Toprol-XL 150 mg daily, Minipress 2 mg nightly.  Monitor with increased mobility 14.  Hyperlipidemia.  Continue Crestor 15.  GERD.  Continue Protonix 16.  History of tobacco as well as polysubstance abuse.  Counseling  17.  Mild Hyper K+ likely due to aldactone monitor with BMET on 12/21, increased from 3.6 on 12/17--> 5.2 on 12/18    Latest Ref Rng & Units 06/18/2023    9:07 AM 06/16/2023    6:24 PM 06/16/2023   11:37 AM  BMP  Glucose 70 - 99 mg/dL 045   409   BUN 6 - 20 mg/dL 21   18   Creatinine 8.11 - 1.24 mg/dL 9.14  7.82  9.56   Sodium 135 - 145 mmol/L 134   136   Potassium 3.5 - 5.1 mmol/L 3.8   5.2   Chloride 98 - 111 mmol/L 96   103   CO2 22 - 32 mmol/L 25   19   Calcium 8.9 - 10.3 mg/dL 8.9   9.2    Repeat improved K+   Cardiology consulted to adjust GDMT LOS: 3 days A FACE TO FACE EVALUATION WAS PERFORMED  Luis [name] 06/19/2023, 8:10 AM

## 2023-06-19 NOTE — IPOC Note (Signed)
Overall Plan of Care [facility]) Patient Details Name: Luis [name] MRN: [id number] DOB: 1971/03/19  Admitting Diagnosis: Subcortical infarction [facility])  Hospital Problems: Principal Problem:   Subcortical infarction [facility])     Functional Problem List: Nursing Behavior, Endurance, Medication Management, Nutrition, Safety  PT Balance, Safety, Sensory, Skin Integrity, Endurance, Motor, Nutrition, Pain  OT Balance, Safety, Cognition, Sensory, Edema, Skin Integrity, Endurance, Vision, Motor, Pain, Perception  SLP Cognition, Nutrition  TR         Basic ADL's: OT Toileting, Dressing, Bathing, Grooming     Advanced  ADL's: OT       Transfers: PT Bed Mobility, Bed to Chair, Car, Occupational psychologist, Research scientist (life sciences): PT Ambulation, Stairs     Additional Impairments: OT Fuctional Use of Upper Extremity  SLP Swallowing, Social Cognition   Problem Solving, Memory  TR      Anticipated Outcomes Item Anticipated Outcome  Self Feeding mod I  Swallowing  modi   Basic self-care  mod I to supevision  Toileting  mod I   Bathroom Transfers mod I  Bowel/Bladder  Maintain continence  Transfers  mod-I using LRAD  Locomotion  mod-I for limited household level gait using LRAD  Communication     Cognition  supervisionA  Pain  <2 with prns  Safety/Judgment  Independent and no falls   Therapy Plan: PT Intensity: Minimum of 1-2 x/day ,45 to 90 minutes PT Frequency: 5 out of 7 days PT Duration Estimated Length of Stay: 7-10 days OT Intensity: Minimum of 1-2 x/day, 45 to 90 minutes OT Frequency: 5 out of 7 days OT Duration/Estimated Length of Stay: 7-9 days SLP Intensity: Minumum of 1-2 x/day, 30 to 90 minutes SLP Frequency: 3 to 5 out of 7 days SLP Duration/Estimated Length of Stay: 7-10 days   Team Interventions: Nursing Interventions Patient/Family Education, Disease Management/Prevention, Medication Management, Cognitive Remediation/Compensation,  Dysphagia/Aspiration Precaution Training, Discharge Planning  PT interventions Ambulation/gait training, Community reintegration, DME/adaptive equipment instruction, Neuromuscular re-education, Psychosocial support, Stair training, UE/LE Strength taining/ROM, Warden/ranger, Discharge planning, Functional electrical stimulation, Pain management, Skin care/wound management, Therapeutic Activities, UE/LE Coordination activities, Cognitive remediation/compensation, Disease management/prevention, Functional mobility training, Patient/family education, Splinting/orthotics, Therapeutic Exercise, Visual/perceptual remediation/compensation  OT Interventions Balance/vestibular training, Discharge planning, Pain management, Self Care/advanced ADL retraining, Therapeutic Activities, UE/LE Coordination activities, Cognitive remediation/compensation, Disease mangement/prevention, Functional mobility training, Patient/family education, Skin care/wound managment, Therapeutic Exercise, Visual/perceptual remediation/compensation, Firefighter, Fish farm manager, Neuromuscular re-education, Psychosocial support, Splinting/orthotics, UE/LE Strength taining/ROM, Wheelchair propulsion/positioning, Functional electrical stimulation  SLP Interventions Cognitive remediation/compensation, Cueing hierarchy, Dysphagia/aspiration precaution training, Functional tasks, Internal/external aids, Patient/family education, Therapeutic Activities, Therapeutic Exercise  TR Interventions    SW/CM Interventions Discharge Planning, Psychosocial Support, Patient/Family Education, Disease Management/Prevention   Barriers to Discharge MD  Medical stability  Nursing Decreased caregiver support, Lack of/limited family support, Medication compliance, Behavior Lives alone, level entry, friend can provide min assist prn  PT Decreased caregiver support, Lack of/limited family support    OT      SLP      SW  Lack of/limited family support, Decreased caregiver support     Team Discharge Planning: Destination: PT-Home ,OT- Home , SLP-Home Projected Follow-up: PT-24 hour supervision/assistance, Home health PT, OT-  Home health OT, SLP-Outpatient SLP Projected Equipment Needs: PT-To be determined, OT- To be determined, SLP-None recommended by SLP Equipment Details: PT- , OT-  Patient/family involved in discharge planning: PT- Patient,  OT-Patient, Family member/caregiver, SLP-Patient  MD ELOS: 7-10d Medical Rehab  Prognosis:  Good Assessment: The patient has been admitted for CIR therapies with the diagnosis of subcortical infarction. The team will be addressing functional mobility, strength, stamina, balance, safety, adaptive techniques and equipment, self-care, bowel and bladder mgt, patient and caregiver education, CHF. Goals have been set at Mod I. Anticipated discharge destination is home.        See Team Conference Notes for weekly updates to the plan of care

## 2023-06-19 NOTE — Progress Notes (Signed)
Rounding Note    Patient Name: Luis [name] Date of Encounter: 06/19/2023  [facility] Cardiologist: Bryan Lemma, MD   Subjective   PT feeling a little woozy today   Feels like he may have gotten too much diuretic   Inpatient Medications    Scheduled Meds:  bumetanide  2 mg Oral BID   clopidogrel  75 mg Oral Daily   dapagliflozin propanediol  10 mg Oral Daily   digoxin  0.125 mg Oral Daily   DULoxetine  30 mg Oral Daily   enoxaparin (LOVENOX) injection  40 mg Subcutaneous Q24H   insulin aspart  0-20 Units Subcutaneous TID PC & HS   insulin aspart  15 Units Subcutaneous TID WC   insulin glargine-yfgn  50 Units Subcutaneous QHS   isoniazid  300 mg Oral Daily   metoprolol succinate  150 mg Oral Daily   nystatin cream   Topical BID   pantoprazole  40 mg Oral Daily   prazosin  2 mg Oral QHS   pyridOXINE  100 mg Oral Daily   rosuvastatin  20 mg Oral Daily   spironolactone  25 mg Oral Daily   trazodone  300 mg Oral QHS   Continuous Infusions:  PRN Meds: acetaminophen **OR** acetaminophen (TYLENOL) oral liquid 160 mg/5 mL **OR** acetaminophen, HYDROcodone-acetaminophen, hydrOXYzine, senna-docusate   Vital Signs    Vitals:   06/18/23 1236 06/18/23 1950 06/18/23 2154 06/19/23 0521  BP: (!) 154/77 126/69 (!) 143/66 (!) 119/44  Pulse: 68 72 81 81  Resp: 17 18  18   Temp: 98.8 F (37.1 C) 98 F (36.7 C)  98.1 F (36.7 C)  TempSrc: Oral Oral    SpO2: 97% 93%  93%  Weight:      Height:        Intake/Output Summary (Last 24 hours) at 06/19/2023 0745 Last data filed at 06/19/2023 0525 Gross per 24 hour  Intake 716 ml  Output 4645 ml  Net -3929 ml      06/16/2023    5:50 PM 06/12/2023    3:29 PM 12/07/2022    1:50 AM  Last 3 Weights  Weight (lbs) 312 lb 6.3 oz 305 lb   Weight (kg) 141.7 kg 138.347 kg      Information is confidential and restricted. Go to Review Flowsheets to unlock data.      Telemetry    None  - Personally  Reviewed  ECG    No new - Personally Reviewed  Physical Exam   GEN: MOrbidly obese 52 yo  Neck: Neck is full  Cardiac: RRR, no murmurs  Distant  Respiratory: Clear to auscultation bilaterally. GI: Obese Nontender  MS: Tr LE edema;   Labs    High Sensitivity Troponin:   Recent Labs  Lab 06/12/23 1715 06/12/23 1840  TROPONINIHS 15 17     Chemistry Recent Labs  Lab 06/12/23 1539 06/14/23 0813 06/15/23 0515 06/16/23 1137 06/16/23 1824 06/18/23 0907  NA 137 135 133* 136  --  134*  K 4.5 4.2 3.6 5.2*  --  3.8  CL 99 99 101 103  --  96*  CO2 24 26 24  19*  --  25  GLUCOSE 421* 337* 287* 277*  --  318*  BUN 10 11 15 18   --  21*  CREATININE 0.86 0.95 0.86 1.47* 1.30* 1.06  CALCIUM 9.5 8.8* 8.7* 9.2  --  8.9  PROT 6.4* 6.1*  --   --   --   --  ALBUMIN 3.2* 3.1*  --   --   --   --   AST 19 20  --   --   --   --   ALT 18 19  --   --   --   --   ALKPHOS 67 48  --   --   --   --   BILITOT 0.5 0.5  --   --   --   --   GFRNONAA >60 >60 >60 56* >60 >60  ANIONGAP 14 10 8 14   --  13    Lipids  Recent Labs  Lab 06/13/23 0443  CHOL 160  TRIG 211*  HDL 46  LDLCALC 72  CHOLHDL 3.5    Hematology Recent Labs  Lab 06/14/23 0813 06/15/23 0515 06/16/23 1824  WBC 7.4 7.9 9.1  RBC 4.80 4.50 5.12  HGB 13.7 12.9* 14.3  HCT 42.2 39.4 44.7  MCV 87.9 87.6 87.3  MCH 28.5 28.7 27.9  MCHC 32.5 32.7 32.0  RDW 12.7 13.0 12.9  PLT 245 235 273   Thyroid No results for input(s): "TSH", "FREET4" in the last 168 hours.  BNP Recent Labs  Lab 06/12/23 1715  BNP 118.4*    DDimer No results for input(s): "DDIMER" in the last 168 hours.   Radiology    No results found.  Cardiac Studies   Echo: 06/14/2023   IMPRESSIONS     1. Left ventricular ejection fraction, by estimation, is 25 to 30%. The  left ventricle has normal function. The left ventricle demonstrates global  hypokinesis. Left ventricular diastolic parameters are consistent with  Grade I diastolic dysfunction   (impaired relaxation).   2. Right ventricular systolic function is mildly reduced. The right  ventricular size is normal. Tricuspid regurgitation signal is inadequate  for assessing PA pressure.   3. The mitral valve is normal in structure. No evidence of mitral valve  regurgitation.   4. The aortic valve is tricuspid. Aortic valve regurgitation is not  visualized. No aortic stenosis is present.   5. The inferior vena cava is normal in size with greater than 50%  respiratory variability, suggesting right atrial pressure of 3 mmHg.   Patient Profile     Luis [name] is a 52 y.o. male with a hx of chronic systolic heart failure, VT status post ICD (MDT), hypertension, hyperlipidemia, diabetes, OSA on CPAP, substance abuse, Jehovah's Witness who is being seen 06/18/2023 for the evaluation of CHF, meds at the request of Dr. Baxter Hire.   Assessment & Plan   1  HFrEF   Known LVEf 25 to 30% Mild decreased RV function  VOlume status assessment  is difficult given body habitus  Would hold bumex   Reassess symptoms, exam and labs in am   Contnue other meds   2  Hx VT  s/p ICD    3  HTN   BP labile, mildly increased at times   Follow since woozy  4  Hyperkalemia  Assess in am     For questions or updates, please contact [facility] Please consult [email or web address] for contact info under        Signed, Dietrich Pates, MD  06/19/2023, 7:45 AM

## 2023-06-20 DIAGNOSIS — I5042 Chronic combined systolic (congestive) and diastolic (congestive) heart failure: Secondary | ICD-10-CM | POA: Diagnosis not present

## 2023-06-20 DIAGNOSIS — I639 Cerebral infarction, unspecified: Secondary | ICD-10-CM | POA: Diagnosis not present

## 2023-06-20 DIAGNOSIS — J9610 Chronic respiratory failure, unspecified whether with hypoxia or hypercapnia: Secondary | ICD-10-CM | POA: Diagnosis not present

## 2023-06-20 LAB — COMPREHENSIVE METABOLIC PANEL
ALT: 21 U/L (ref 0–44)
AST: 19 U/L (ref 15–41)
Albumin: 3.4 g/dL — ABNORMAL LOW (ref 3.5–5.0)
Alkaline Phosphatase: 57 U/L (ref 38–126)
Anion gap: 12 (ref 5–15)
BUN: 23 mg/dL — ABNORMAL HIGH (ref 6–20)
CO2: 26 mmol/L (ref 22–32)
Calcium: 8.8 mg/dL — ABNORMAL LOW (ref 8.9–10.3)
Chloride: 99 mmol/L (ref 98–111)
Creatinine, Ser: 0.97 mg/dL (ref 0.61–1.24)
GFR, Estimated: >60 mL/min (ref >60)
Glucose, Bld: 236 mg/dL — ABNORMAL HIGH (ref 70–99)
Potassium: 3.7 mmol/L (ref 3.5–5.1)
Sodium: 137 mmol/L (ref 135–145)
Total Bilirubin: 0.4 mg/dL (ref <1.2)
Total Protein: 6.8 g/dL (ref 6.5–8.1)

## 2023-06-20 LAB — GLUCOSE, CAPILLARY
Glucose-Capillary: 263 mg/dL — ABNORMAL HIGH (ref 70–99)
Glucose-Capillary: 229 mg/dL — ABNORMAL HIGH (ref 70–99)
Glucose-Capillary: 195 mg/dL — ABNORMAL HIGH (ref 70–99)
Glucose-Capillary: 180 mg/dL — ABNORMAL HIGH (ref 70–99)

## 2023-06-20 LAB — BRAIN NATRIURETIC PEPTIDE: B Natriuretic Peptide: 58.5 pg/mL (ref 0.0–100.0)

## 2023-06-20 MED ORDER — INSULIN GLARGINE-YFGN 100 UNIT/ML ~~LOC~~ SOLN
60.0000 [IU] | Freq: Every day | SUBCUTANEOUS | Status: DC
  Administered 2023-06-20 – 2023-06-22 (×3): 60 [IU] via SUBCUTANEOUS
  Filled 2023-06-20 (×4): qty 0.6

## 2023-06-20 MED ORDER — LORATADINE 10 MG PO TABS
10.0000 mg | ORAL_TABLET | Freq: Every day | ORAL | Status: DC | PRN
  Administered 2023-06-20 – 2023-06-27 (×7): 10 mg via ORAL
  Filled 2023-06-20 (×9): qty 1

## 2023-06-20 NOTE — Progress Notes (Signed)
Occupational Therapy Session Note  Patient Details  Name: Luis [name] MRN: [id number] Date of Birth: 1971-06-04  Today's Date: 06/20/2023 OT Individual Time: 1191-4782 OT Individual Time Calculation (min): 75 min    Short Term Goals: Week 1:  OT Short Term Goal 1 (Week 1): STG=LTG d/t ELOS  Skilled Therapeutic Interventions/Progress Updates:    OT intervention with focus on RUE functional use, sit<>stand, standing balance, functional amb with RW, BADLs, and safety awareness to increase independence with BADLs. O2 sats>93% on RA throughout session. Initial focus on use of RUE during self feeding (eating breakfast). Pt continues to required assistance opening containers.Sit<>stand and amb with RW to bathroom with CGA. Pt remained standing to remove shirt and pants before transferring to TTB in shower. Pt used long handle sponge to assist with bathing tasks. Pt returned to EOB to complete dressing tasks. Pt uses reacher to assist with threading pants over BLE. Assistance for donning Rt sock. Pt wears slide on shoes/sandals. Standing at sink to brush teeth. Min verbal cues to use RUE to assist with funcitonal tasks at sink. Pt returned to recliner. Seat alarm activated. All needs within reach.   Therapy Documentation Precautions:  Precautions Precautions: Fall, Other (comment) Precaution Comments: 2L of O2 via nasal cannula baseline (pt reports using it PRN), mild R hemi (LE>UE) Restrictions Weight Bearing Restrictions Per Provider Order: No   Pain: Pt denies pain this morning  Therapy/Group: Individual Therapy  Rich Brave 06/20/2023, 8:17 AM

## 2023-06-20 NOTE — Progress Notes (Signed)
PROGRESS NOTE   Subjective/Complaints:  Appreciate cardiology consult ,  Left ear feels better pt feels RIght foot swollen wants me to check   Review of systems negative chest pain, shortness of breath, nausea vomiting diarrhea  Objective:   No results found. No results for input(s): "WBC", "HGB", "HCT", "PLT" in the last 72 hours.  Recent Labs    06/18/23 0907  NA 134*  K 3.8  CL 96*  CO2 25  GLUCOSE 318*  BUN 21*  CREATININE 1.06  CALCIUM 8.9    Intake/Output Summary (Last 24 hours) at 06/20/2023 0930 Last data filed at 06/20/2023 0825 Gross per 24 hour  Intake 1332 ml  Output 2925 ml  Net -1593 ml        Physical Exam: Vital Signs Blood pressure 139/68, pulse 63, temperature 98 F (36.7 C), temperature source Oral, resp. rate 18, height 5\' 6"  (1.676 m), weight (!) 145.2 kg, SpO2 97%.   General: No acute distress Mood and affect are appropriate Heart: Regular rate and rhythm no rubs murmurs or extra sounds Lungs: Clear to auscultation, breathing unlabored, no rales or wheezes Abdomen: Positive bowel sounds, soft nontender to palpation, nondistended Extremities: No clubbing, cyanosis, perhaps trace pedal edema R vs left but not sig difference Foot non tender no erythema  Skin: No evidence of breakdown, no evidence of rash Neurologic: Cranial nerves II through XII intact, motor strength is 5/5 in left and 4/5 right deltoid, bicep, tricep, grip, hip flexor, knee extensors, ankle dorsiflexor and plantar flexor Sensory exam normal sensation to light touch and proprioception in bilateral upper and lower extremities  Musculoskeletal: Full range of motion in all 4 extremities. No joint swelling   Assessment/Plan: 1. Functional deficits which require 3+ hours per day of interdisciplinary therapy in a comprehensive inpatient rehab setting. Physiatrist is providing close team supervision and 24 hour management  of active medical problems listed below. Physiatrist and rehab team continue to assess barriers to discharge/monitor patient progress toward functional and medical goals  Care Tool:  Bathing    Body parts bathed by patient: Right arm, Left arm, Chest, Abdomen, Front perineal area, Buttocks, Right upper leg, Left upper leg, Right lower leg, Left lower leg, Face         Bathing assist Assist Level: Supervision/Verbal cueing     Upper Body Dressing/Undressing Upper body dressing   What is the patient wearing?: Pull over shirt    Upper body assist Assist Level: Supervision/Verbal cueing    Lower Body Dressing/Undressing Lower body dressing      What is the patient wearing?: Pants, Underwear/pull up     Lower body assist Assist for lower body dressing: Supervision/Verbal cueing     Toileting Toileting    Toileting assist Assist for toileting: Minimal Assistance - Patient > 75%     Transfers Chair/bed transfer  Transfers assist     Chair/bed transfer assist level: Minimal Assistance - Patient > 75%     Locomotion Ambulation   Ambulation assist      Assist level: Minimal Assistance - Patient > 75% Assistive device: Walker-rolling Max distance: 150ft   Walk 10 feet activity   Assist  Assist level: Minimal Assistance - Patient > 75% Assistive device: Walker-rolling   Walk 50 feet activity   Assist    Assist level: Minimal Assistance - Patient > 75% Assistive device: Walker-rolling    Walk 150 feet activity   Assist Walk 150 feet activity did not occur: Safety/medical concerns         Walk 10 feet on uneven surface  activity   Assist     Assist level: Minimal Assistance - Patient > 75% Assistive device: Walker-rolling   Wheelchair     Assist Is the patient using a wheelchair?: Yes Type of Wheelchair: Manual    Wheelchair assist level: Dependent - Patient 0%      Wheelchair 50 feet with 2 turns  activity    Assist        Assist Level: Dependent - Patient 0%   Wheelchair 150 feet activity     Assist      Assist Level: Dependent - Patient 0%   Blood pressure 139/68, pulse 63, temperature 98 F (36.7 C), temperature source Oral, resp. rate 18, height 5\' 6"  (1.676 m), weight (!) 145.2 kg, SpO2 97%.  Medical Problem List and Plan: 1. 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             -patient may shower             -ELOS/Goals: 7 days             Admit to CIR 2.  Antithrombotics: -DVT/anticoagulation:  Pharmaceutical: Lovenox             -antiplatelet therapy: Plavix 75 mg daily.  Patient is allergic to aspirin 3. Pain Management: Hydrocodone 1 tablet every 4 hours as needed 4. Mood/Behavior/Sleep: Cymbalta 30 mg daily, trazodone 200 mg nightly, Atarax as needed             -antipsychotic agents: N/A 5. Neuropsych/cognition: This patient is capable of making decisions on his own behalf. 6. Skin/Wound Care: Routine skin checks 7. Fluids/Electrolytes/Nutrition: Routine in and outs with follow-up chemistries 8.  Acute/chronic respiratory failure.  Monitor oxygen saturations 9.  OSA.  CPAP 10.  Morbid obesity.  BMI 50.75.  Dietary follow-up 11.  Poorly controlled diabetes mellitus.  Hemoglobin A1c 12. Increase  Semglee 50 units daily, NovoLog 15 units 3 times daily.  Diabetic teaching CBG (last 3)  Recent Labs    06/19/23 1747 06/19/23 2039 06/20/23 0538  GLUCAP 141* 231* 263*  Uncontrolled increasing Semglee 12/21 to 55U, increase to 60U 12/22  12.  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 Farxiga 10 mg daily, Lanoxin 0.125 mg daily, Bumex 2 mg twice daily, Aldactone 25 mg daily.  Cardiology consulted to adjust GDMT and ensure f/u , not sure if pt may need Heart failure team to manageMonitor for any signs of fluid  overload. Daily weights ordered 13.  Hypertension. Continue Toprol-XL 150 mg daily, Minipress 2 mg nightly.  Monitor with increased mobility 14.  Hyperlipidemia.  Continue Crestor 15.  GERD.  Continue Protonix 16.  History of tobacco as well as polysubstance abuse.  Counseling  17.  Mild Hyper K+ likely due to aldactone monitor with BMET on 12/21, increased from 3.6 on 12/17--> 5.2 on 12/18    Latest Ref Rng & Units 06/18/2023    9:07 AM 06/16/2023    6:24 PM 06/16/2023   11:37 AM  BMP  Glucose 70 - 99 mg/dL 409   811   BUN 6 - 20 mg/dL 21   18   Creatinine 9.14 - 1.24 mg/dL 7.82  9.56  2.13   Sodium 135 - 145 mmol/L 134   136   Potassium 3.5 - 5.1 mmol/L 3.8   5.2   Chloride 98 - 111 mmol/L 96   103   CO2 22 - 32 mmol/L 25   19   Calcium 8.9 - 10.3 mg/dL 8.9   9.2    Repeat improved K+, recheck today    Cardiology consulted to adjust GDMT LOS: 4 days A FACE TO FACE EVALUATION WAS PERFORMED  Erick Colace 06/20/2023, 9:30 AM

## 2023-06-20 NOTE — Progress Notes (Signed)
Initially refused AM labs. Prior to lab coming endorsed having another headache this morning. PRN pain medication given. Explained importance of having these labs completed. Expressed willingness to allow lab to come back today to obtain needed blood work.

## 2023-06-20 NOTE — Progress Notes (Signed)
Physical Therapy Session Note  Patient Details  Name: Luis [name] MRN: [id number] Date of Birth: 16-Nov-1970  Today's Date: 06/20/2023 PT Individual Time: 0930-1015 PT Individual Time Calculation (min): 45 min   Short Term Goals: Week 1:  PT Short Term Goal 1 (Week 1): = to LTGs based on ELOS  Skilled Therapeutic Interventions/Progress Updates:   Session focused on functional gait with RW and NMR to address RLE strength and coordination. Pt performed supervision to CGA transfers with RW throughout session including toileting at supervision level. Functional gait training on unit with RW x 120', x 150', and x 100' with overall supervision to occasional CGA with cues and focus on RLE activation as pt noted to circumduct and no active hip flexion. Focused on targeted RLE hip flexion activation in seated position with active assist, standing with R toe tap to 3" soft compliant surface and then performed in seated position all about 10-15 reps each with rest breaks in between. Functional gait as described above and then returned to room with supervision and all needs in reach.   Therapy Documentation Precautions:  Precautions Precautions: Fall, Other (comment) Precaution Comments: 2L of O2 via nasal cannula baseline (pt reports using it PRN), mild R hemi (LE>UE) Restrictions Weight Bearing Restrictions Per Provider Order: No    Pain: No complaints of pain. Reports some intermittent knee pain but did not impact therapy session.   Therapy/Group: Individual Therapy  Karolee Stamps Darrol Poke, PT, DPT, CBIS  06/20/2023, 12:28 PM

## 2023-06-20 NOTE — Progress Notes (Signed)
Rounding Note    Patient Name: Luis [name] Date of Encounter: 06/20/2023  [facility] Cardiologist: Bryan Lemma, MD   Subjective   Still feeling a little woozy today   Inpatient Medications    Scheduled Meds:  clopidogrel  75 mg Oral Daily   dapagliflozin propanediol  10 mg Oral Daily   digoxin  0.125 mg Oral Daily   DULoxetine  30 mg Oral Daily   enoxaparin (LOVENOX) injection  40 mg Subcutaneous Q24H   insulin aspart  0-20 Units Subcutaneous TID PC & HS   insulin aspart  15 Units Subcutaneous TID WC   insulin glargine-yfgn  60 Units Subcutaneous QHS   isoniazid  300 mg Oral Daily   metoprolol succinate  150 mg Oral Daily   nystatin cream   Topical BID   oxymetazoline  1 spray Each Nare BID   pantoprazole  40 mg Oral Daily   prazosin  2 mg Oral QHS   pyridOXINE  100 mg Oral Daily   rosuvastatin  20 mg Oral Daily   spironolactone  25 mg Oral Daily   trazodone  300 mg Oral QHS   Continuous Infusions:  PRN Meds: acetaminophen **OR** acetaminophen (TYLENOL) oral liquid 160 mg/5 mL **OR** acetaminophen, HYDROcodone-acetaminophen, hydrOXYzine, senna-docusate   Vital Signs    Vitals:   06/19/23 1901 06/19/23 2205 06/20/23 0412 06/20/23 0500  BP: 128/62 (!) 125/52 139/68   Pulse: 73  63   Resp: 18  18   Temp: 98.5 F (36.9 C)  98 F (36.7 C)   TempSrc: Oral  Oral   SpO2: 96%  97%   Weight:    (!) 145.2 kg  Height:        Intake/Output Summary (Last 24 hours) at 06/20/2023 1307 Last data filed at 06/20/2023 0825 Gross per 24 hour  Intake 860 ml  Output 2525 ml  Net -1665 ml      06/20/2023    5:00 AM 06/16/2023    5:50 PM 06/12/2023    3:29 PM  Last 3 Weights  Weight (lbs) 320 lb 1.7 oz 312 lb 6.3 oz 305 lb  Weight (kg) 145.2 kg 141.7 kg 138.347 kg      Telemetry    None  - Personally Reviewed  ECG    No new - Personally Reviewed  Physical Exam   GEN: MOrbidly obese 52 yo  Sitting in recliner  Neck: Neck is full   Cardiac: RRR, II/VI systolic murmur LSB Distant heart sounds  Respiratory: Clear to auscultation  GI: Obese Nontender  MS: Tr LE edema;   Labs    High Sensitivity Troponin:   Recent Labs  Lab 06/12/23 1715 06/12/23 1840  TROPONINIHS 15 17     Chemistry Recent Labs  Lab 06/14/23 0813 06/15/23 0515 06/16/23 1137 06/16/23 1824 06/18/23 0907 06/20/23 1219  NA 135   < > 136  --  134* 137  K 4.2   < > 5.2*  --  3.8 3.7  CL 99   < > 103  --  96* 99  CO2 26   < > 19*  --  25 26  GLUCOSE 337*   < > 277*  --  318* 236*  BUN 11   < > 18  --  21* 23*  CREATININE 0.95   < > 1.47* 1.30* 1.06 0.97  CALCIUM 8.8*   < > 9.2  --  8.9 8.8*  PROT 6.1*  --   --   --   --  6.8  ALBUMIN 3.1*  --   --   --   --  3.4*  AST 20  --   --   --   --  19  ALT 19  --   --   --   --  21  ALKPHOS 48  --   --   --   --  57  BILITOT 0.5  --   --   --   --  0.4  GFRNONAA >60   < > 56* >60 >60 >60  ANIONGAP 10   < > 14  --  13 12   < > = values in this interval not displayed.    Lipids  No results for input(s): "CHOL", "TRIG", "HDL", "LABVLDL", "LDLCALC", "CHOLHDL" in the last 168 hours.   Hematology Recent Labs  Lab 06/14/23 0813 06/15/23 0515 06/16/23 1824  WBC 7.4 7.9 9.1  RBC 4.80 4.50 5.12  HGB 13.7 12.9* 14.3  HCT 42.2 39.4 44.7  MCV 87.9 87.6 87.3  MCH 28.5 28.7 27.9  MCHC 32.5 32.7 32.0  RDW 12.7 13.0 12.9  PLT 245 235 273   Thyroid No results for input(s): "TSH", "FREET4" in the last 168 hours.  BNP Recent Labs  Lab 06/20/23 1219  BNP 58.5    DDimer No results for input(s): "DDIMER" in the last 168 hours.   Radiology    No results found.  Cardiac Studies   Echo: 06/14/2023   IMPRESSIONS     1. Left ventricular ejection fraction, by estimation, is 25 to 30%. The  left ventricle has normal function. The left ventricle demonstrates global  hypokinesis. Left ventricular diastolic parameters are consistent with  Grade I diastolic dysfunction  (impaired relaxation).    2. Right ventricular systolic function is mildly reduced. The right  ventricular size is normal. Tricuspid regurgitation signal is inadequate  for assessing PA pressure.   3. The mitral valve is normal in structure. No evidence of mitral valve  regurgitation.   4. The aortic valve is tricuspid. Aortic valve regurgitation is not  visualized. No aortic stenosis is present.   5. The inferior vena cava is normal in size with greater than 50%  respiratory variability, suggesting right atrial pressure of 3 mmHg.   Patient Profile     Luis [name] is a 52 y.o. male with a hx of chronic systolic heart failure (s/p reported BiV ICD) VT status post ICD (MDT), hypertension, hyperlipidemia, DM (poorly controlled), OSA on CPAP, substance abuse, PTSDJehovah's Witness who is being seen 06/18/2023 for the evaluation of CHF, meds at the request of Dr. Baxter Hire.   Assessment & Plan   1  HFrEF   Known LVEf 25 to 30% Mild decreased RV function  VOlume status assessment  is extremely difficult to assess given body habitus  PM Bumex was held yesterday as he was feeling worse   BUN/CR OK today    I would add back to daily   (was on BID prior to admission)  WIll add back spironolactone, at 12.5    Continue Toprol XL and digoxin With woozy sensation and BP 110s to 140s I would not push medications too fast    Follow  He has been on Entresto  IT was stopped 12/18 Also on prazosin (which may be contrib to wooziness)  2  Hx VT  s/p ICD    3  Chronic respiratory failure   Pt wears O2 chronically  3  HTN   BP labile,  Follow since woozy  Slowly adding meds   4  OSA  Uses CPAP since his teens  4  Hyperkalemia K 3.7 today   Follow with additions bumex and spironolactone   For questions or updates, please contact [facility] Please consult [email or web address] for contact info under        Signed, Dietrich Pates, MD  06/20/2023, 1:07 PM

## 2023-06-20 NOTE — Progress Notes (Signed)
   06/20/23 2044  BiPAP/CPAP/SIPAP  $ Non-Invasive Ventilator  Non-Invasive Vent Subsequent  BiPAP/CPAP/SIPAP Pt Type Adult  BiPAP/CPAP/SIPAP Resmed  Mask Type Full face mask  Mask Size Medium  PEEP 11 cmH20  FiO2 (%) 28 %  Flow Rate 2 lpm  Patient Home Equipment No  Auto Titrate No

## 2023-06-20 NOTE — Progress Notes (Signed)
Physical Therapy Session Note  Patient Details  Name: Luis [name] MRN: [id number] Date of Birth: Apr 01, 1971  Today's Date: 06/20/2023 PT Individual Time: 1410-1435 PT Individual Time Calculation (min): 25 min   Short Term Goals: Week 1:  PT Short Term Goal 1 (Week 1): = to LTGs based on ELOS  Skilled Therapeutic Interventions/Progress Updates:    Chart reviewed and pt agreeable to therapy. Pt received reclined in bed with no c/o pain. Also of note, pt used 2L O2 via [place] t/o session. Session focused on amb endurance and independence to promote safe home access. Pt initiated session with amb to toilet using CGA + RW and CGA for balance. Pt then completed 259ft + 233ft amb usin CGA + RW fading to supervision + RW. Pt gait speed noted to be 0.77m/s indicating fall risk. Pt noted to have RLE circumduction that pt reports being unable to sense during amb. Session education emphasized CIR fall prevention policies. At end of session, pt was left seated EOB with alarm engaged, nurse call bell and all needs in reach.     Therapy Documentation Precautions:  Precautions Precautions: Fall, Other (comment) Precaution Comments: 2L of O2 via nasal cannula baseline (pt reports using it PRN), mild R hemi (LE>UE) Restrictions Weight Bearing Restrictions Per Provider Order: No General:      Therapy/Group: Individual Therapy  Dionne Milo, PT, DPT 06/20/2023, 2:46 PM

## 2023-06-20 NOTE — Progress Notes (Signed)
Speech Language Pathology Daily Session Note  Patient Details  Name: Luis [name] MRN: [id number] Date of Birth: [birth year]  Today's Date: 06/20/2023 SLP Individual Time: 1025-1103 SLP Individual Time Calculation (min): 38 min  Short Term Goals: Week 1: SLP Short Term Goal 1 (Week 1): Stg=ltg due to elos  Skilled Therapeutic Interventions: Pt seen in room for skilled intervention focusing on cognition. He completed moderately complex delayed recall task with details from verbal messages with min A. Pt encouraged to cont using calendar and reminders on phone once home to increase recall and organization with schedule. Cont therapy per plan of care.   Pain Pain Assessment Pain Scale: Faces Pain Score: 9  Faces Pain Scale: No hurt Pain Type: Acute pain Pain Location: Head Pain Orientation: Left;Posterior Pain Descriptors / Indicators: Headache Pain Frequency: Constant Pain Onset: On-going Pain Intervention(s): Medication (See eMAR)  Therapy/Group: Individual Therapy  Carlean Jews Toy Eisemann 06/20/2023, 12:06 PM

## 2023-06-21 DIAGNOSIS — I5023 Acute on chronic systolic (congestive) heart failure: Secondary | ICD-10-CM

## 2023-06-21 DIAGNOSIS — I420 Dilated cardiomyopathy: Secondary | ICD-10-CM

## 2023-06-21 DIAGNOSIS — I639 Cerebral infarction, unspecified: Secondary | ICD-10-CM | POA: Diagnosis not present

## 2023-06-21 DIAGNOSIS — F1410 Cocaine abuse, uncomplicated: Secondary | ICD-10-CM

## 2023-06-21 DIAGNOSIS — I10 Essential (primary) hypertension: Secondary | ICD-10-CM

## 2023-06-21 LAB — CBC WITH DIFFERENTIAL/PLATELET
Abs Immature Granulocytes: 0.03 10*3/uL (ref 0.00–0.07)
Basophils Absolute: 0 10*3/uL (ref 0.0–0.1)
Basophils Relative: 0 %
Eosinophils Absolute: 0.1 10*3/uL (ref 0.0–0.5)
Eosinophils Relative: 1 %
HCT: 40.7 % (ref 39.0–52.0)
Hemoglobin: 13.1 g/dL (ref 13.0–17.0)
Immature Granulocytes: 0 %
Lymphocytes Relative: 24 %
Lymphs Abs: 2 10*3/uL (ref 0.7–4.0)
MCH: 28.5 pg (ref 26.0–34.0)
MCHC: 32.2 g/dL (ref 30.0–36.0)
MCV: 88.7 fL (ref 80.0–100.0)
Monocytes Absolute: 0.6 10*3/uL (ref 0.1–1.0)
Monocytes Relative: 8 %
Neutro Abs: 5.5 10*3/uL (ref 1.7–7.7)
Neutrophils Relative %: 67 %
Platelets: 263 10*3/uL (ref 150–400)
RBC: 4.59 MIL/uL (ref 4.22–5.81)
RDW: 12.8 % (ref 11.5–15.5)
WBC: 8.3 10*3/uL (ref 4.0–10.5)
nRBC: 0 % (ref 0.0–0.2)

## 2023-06-21 LAB — GLUCOSE, CAPILLARY
Glucose-Capillary: 219 mg/dL — ABNORMAL HIGH (ref 70–99)
Glucose-Capillary: 235 mg/dL — ABNORMAL HIGH (ref 70–99)
Glucose-Capillary: 153 mg/dL — ABNORMAL HIGH (ref 70–99)
Glucose-Capillary: 263 mg/dL — ABNORMAL HIGH (ref 70–99)

## 2023-06-21 LAB — CREATININE, SERUM
Creatinine, Ser: 0.97 mg/dL (ref 0.61–1.24)
GFR, Estimated: >60 mL/min (ref >60)

## 2023-06-21 MED ORDER — SACUBITRIL-VALSARTAN 97-103 MG PO TABS
1.0000 | ORAL_TABLET | Freq: Two times a day (BID) | ORAL | Status: DC
Start: 2023-06-22 — End: 2023-06-29
  Administered 2023-06-22 – 2023-06-29 (×15): 1 via ORAL
  Filled 2023-06-21 (×16): qty 1

## 2023-06-21 MED ORDER — CARVEDILOL 3.125 MG PO TABS
3.1250 mg | ORAL_TABLET | Freq: Two times a day (BID) | ORAL | Status: DC
Start: 2023-06-22 — End: 2023-06-29
  Administered 2023-06-22 – 2023-06-29 (×15): 3.125 mg via ORAL
  Filled 2023-06-21 (×15): qty 1

## 2023-06-21 MED ORDER — SACUBITRIL-VALSARTAN 97-103 MG PO TABS: 1.0000 | ORAL_TABLET | Freq: Two times a day (BID) | ORAL | Status: DC

## 2023-06-21 MED ORDER — MELATONIN 5 MG PO TABS
5.0000 mg | ORAL_TABLET | Freq: Every evening | ORAL | Status: DC | PRN
  Administered 2023-06-24 – 2023-06-29 (×5): 5 mg via ORAL
  Filled 2023-06-21 (×5): qty 1

## 2023-06-21 NOTE — Progress Notes (Signed)
PROGRESS NOTE   Subjective/Complaints:   No events overnight.  Patient complaining of ongoing difficulty sleeping, states he is able to get to sleep but wakes up at approximately 3 to 4 AM and cannot get back to sleep.  Also endorsing edema/tightness in bilateral lower extremities over the past few days.  CBC this a.m. stable.  CMP yesterday with mildly elevated BUN, creatinine stable, otherwise stable. Blood sugars remain elevated, consistently greater than 200.  BP slightly elevated overnight, 150s over 60s, otherwise vital stable, satting in mid 90s on CPAP overnight. Last bowel movement 12-22, large.  Bowel bladder continent.  Review of systems + difficulty sleeping , + edema.  Negative chest pain, shortness of breath, nausea vomiting diarrhea  Objective:   No results found. Recent Labs    06/21/23 0750  WBC 8.3  HGB 13.1  HCT 40.7  PLT 263    Recent Labs    06/18/23 0907 06/20/23 1219 06/21/23 0750  NA 134* 137  --   K 3.8 3.7  --   CL 96* 99  --   CO2 25 26  --   GLUCOSE 318* 236*  --   BUN 21* 23*  --   CREATININE 1.06 0.97 0.97  CALCIUM 8.9 8.8*  --     Intake/Output Summary (Last 24 hours) at 06/21/2023 0834 Last data filed at 06/21/2023 0340 Gross per 24 hour  Intake 772 ml  Output 1200 ml  Net -428 ml        Physical Exam: Vital Signs Blood pressure (!) 152/67, pulse 70, temperature 98.9 F (37.2 C), resp. rate 18, height 5\' 6"  (1.676 m), weight (!) 145.2 kg, SpO2 95%.   General: No acute distress.  Sitting up in bedside chair.   Psych: Mood and affect are appropriate Heart: Regular rate and rhythm no rubs murmurs or extra sounds Lungs: Clear to auscultation, breathing unlabored, no rales or wheezes.  On 2 L nasal cannula. Abdomen: Positive bowel sounds, soft nontender to palpation, protuberant. Extremities: 2+ edema bilateral lower extremities. Skin: No evidence of breakdown, no  evidence of rash Neurologic: Awake, alert, and oriented x 3.  No apparent deficits. Cranial nerves II through XII intact,  motor strength 4/5 throughout in bilateral upper and lower extremities Sensory exam normal sensation to light touch and proprioception in bilateral upper and lower extremities Musculoskeletal: Full range of motion in all 4 extremities. No joint swelling   Assessment/Plan: 1. Functional deficits which require 3+ hours per day of interdisciplinary therapy in a comprehensive inpatient rehab setting. Physiatrist is providing close team supervision and 24 hour management of active medical problems listed below. Physiatrist and rehab team continue to assess barriers to discharge/monitor patient progress toward functional and medical goals  Care Tool:  Bathing    Body parts bathed by patient: Right arm, Left arm, Chest, Abdomen, Front perineal area, Buttocks, Right upper leg, Left upper leg, Right lower leg, Left lower leg, Face         Bathing assist Assist Level: Supervision/Verbal cueing     Upper Body Dressing/Undressing Upper body dressing   What is the patient wearing?: Pull over shirt    Upper body assist Assist  Level: Supervision/Verbal cueing    Lower Body Dressing/Undressing Lower body dressing      What is the patient wearing?: Pants, Underwear/pull up     Lower body assist Assist for lower body dressing: Supervision/Verbal cueing     Toileting Toileting    Toileting assist Assist for toileting: Supervision/Verbal cueing     Transfers Chair/bed transfer  Transfers assist     Chair/bed transfer assist level: Supervision/Verbal cueing     Locomotion Ambulation   Ambulation assist      Assist level: Contact Guard/Touching assist Assistive device: Walker-rolling Max distance: 150'   Walk 10 feet activity   Assist     Assist level: Supervision/Verbal cueing Assistive device: Walker-rolling   Walk 50 feet  activity   Assist    Assist level: Contact Guard/Touching assist Assistive device: Walker-rolling    Walk 150 feet activity   Assist Walk 150 feet activity did not occur: Safety/medical concerns  Assist level: Contact Guard/Touching assist Assistive device: Walker-rolling    Walk 10 feet on uneven surface  activity   Assist     Assist level: Minimal Assistance - Patient > 75% Assistive device: Walker-rolling   Wheelchair     Assist Is the patient using a wheelchair?: Yes Type of Wheelchair: Manual    Wheelchair assist level: Dependent - Patient 0%      Wheelchair 50 feet with 2 turns activity    Assist        Assist Level: Dependent - Patient 0%   Wheelchair 150 feet activity     Assist      Assist Level: Dependent - Patient 0%   Blood pressure (!) 152/67, pulse 70, temperature 98.9 F (37.2 C), resp. rate 18, height 5\' 6"  (1.676 m), weight (!) 145.2 kg, SpO2 95%.  Medical Problem List and Plan: 1. 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             -patient may shower             -ELOS/Goals: 7 days             -Stable to continue CIR  2.  Antithrombotics: -DVT/anticoagulation:  Pharmaceutical: Lovenox             -antiplatelet therapy: Plavix 75 mg daily.  Patient is allergic to aspirin 3. Pain Management: Hydrocodone 1 tablet every 4 hours as needed 4. Mood/Behavior/Sleep: Cymbalta 30 mg daily, trazodone 200 mg nightly, Atarax as needed             -antipsychotic agents: N/A  -12/23: Waking up in the middle of the night, adding melatonin 5 mg as needed  5. Neuropsych/cognition: This patient is capable of making decisions on his own behalf. 6. Skin/Wound Care: Routine skin checks 7. Fluids/Electrolytes/Nutrition: Routine in and outs with follow-up chemistries  12-22: Labs appear stable  8.  Acute/chronic respiratory  failure.  Monitor oxygen saturations  -Respiratory status has been stable 9.  OSA.  CPAP 10.  Morbid obesity.  BMI 50.75.  Dietary follow-up 11.  Poorly controlled diabetes mellitus.  Hemoglobin A1c 12. Increase  Semglee 50 units daily, NovoLog 15 units 3 times daily.  Diabetic teaching   - Farxiga 10 mg daily per cardiology for GDMT  -Uncontrolled increasing Semglee 12/21 to 55U, increase to 60U 12/22-received new dose last night, hold on adjustments today    CBG (last 3)  Recent Labs    06/20/23  1657 06/20/23 2051 06/21/23 0555  GLUCAP 195* 180* 219*    12.  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 Farxiga 10 mg daily, Lanoxin 0.125 mg daily, Bumex 2 mg twice daily, Aldactone 25 mg daily.  Cardiology consulted to adjust GDMT and ensure f/u , not sure if pt may need Heart failure team to manageMonitor for any signs of fluid overload. Daily weights ordered Filed Weights   06/16/23 1750 06/20/23 0500  Weight: (!) 141.7 kg (!) 145.2 kg   12-23: Weight uptrending-cardiology thinks this is inaccurate given -9L urine output since admission.  held Bumex 1 time over the weekend due to hypotension/dizziness, added back daily  (was prior to twice daily)  Resumed spironolactone 12.5 mg daily yesterday  Increase Bumex back to 2 mg twice daily  Switch Toprol to home carvedilol 3.125 mg twice daily  Restart home Entresto 97-23 mg twice daily  Continue digoxin  13.  Hypertension. Continue Toprol-XL 150 mg daily, Minipress 2 mg nightly.  Monitor with increased mobility    06/21/2023    5:17 AM 06/20/2023    7:14 PM 06/20/2023    2:38 PM  Vitals with BMI  Systolic 152 150 010  Diastolic 67 74 68  Pulse 70 60 63   - 12/23: BPs look stable/mildly elevated.  Medication adjustments as above.  Appreciate cardiology assistance.  14.  Hyperlipidemia.  Continue Crestor 15.  GERD.  Continue Protonix 16.  History of tobacco as well as polysubstance abuse.   Counseling  17.  Mild Hyper K+ likely due to aldactone monitor with BMET on 12/21, increased from 3.6 on 12/17--> 5.2 on 12/18 -Potassium 3.7 12-22; monitoring with addition of spironolactone; Daily BMPs   LOS: 5 days A FACE TO FACE EVALUATION WAS PERFORMED  Angelina Sheriff 06/21/2023, 8:34 AM

## 2023-06-21 NOTE — Progress Notes (Signed)
Occupational Therapy Session Note  Patient Details  Name: Luis [name] MRN: [id number] Date of Birth: 09/13/1970  Today's Date: 06/21/2023 OT Individual Time: 1419-1530 OT Individual Time Calculation (min): 71 min    Short Term Goals: Week 1:  OT Short Term Goal 1 (Week 1): STG=LTG d/t ELOS  Skilled Therapeutic Interventions/Progress Updates:     Pt received lightly sleeping in bed waking up OT arrival presenting to be tired, however receptive to skilled OT session reporting 0/10 pain- OT offering intermittent rest breaks, repositioning, and therapeutic support to optimize participation in therapy session. Pt requesting to take shower this PM. Focus this session BADL retraining and d/c planning.   Focused beginning of session on d/c planning with focus on DME needs and supports at d/c. Pt reporting he currently has a GF who lives with him and can help out with IADLs and care giving, however he expressed that they have a complicated relationship and that she may not be a part of his life anymore once he gets out of the hospital. Pt then becoming tearful expressing how he missed his wife who passed years ago and that he is feeling depressed again around the holidays. Provided maximal therapeutic support and encouragement with mild improvement in moral. Engaged Pt in d/c planning discussion with focus on family/friend supports. Pt stating his adult children live out of state and are unable to step in as caregivers, however he has a couple of friends that would be willing to help out with IADL tasks (cooking and cleaning). Encouraged Pt to reach out to supportive friends and educated on option of having friends come in for family education session if desired- Pt reporting he would reach out to them to inquire more about this.   Pt completed functional mobility to bathroom using RW with close supervision and transferred to standard toilet seat using grab bar. Provided increased time on toilet  d/t need for BM- continent B & B documented in flowsheets. Pt able to complete 3/3 toileting tasks with close supervision, however d/t body habitus Pt completed portion of posterior peri-care in shower to ensure cleanliness. Pt doffed clothing in seated position on toilet supervision.   Pt completed ambulatory transfer to walk-in shower using RW and grab bars with CGA. Once seated on TTB, Pt able to compete U/LB bathing with supervision using long handled sponge to wash feet, lower B LEs, and his bottom. Pt dried self in seat position position supervision with good safety awareness noted during task.   Functional mobility back to room CGA using RW. Pt completed U/LB bathing sitting EOB. Pt donned shirt supervision. Pt weaved B LEs into pants without AD this session by reaching towards ground and stood using RW to bring pants to waist close supervision. Pt required increased time and multiple seated rest breaks during task- Pt able to apply energy conservation strategies during BADLs with min questioning cues.   Pt requesting to retrieve clothing items from drier. Engaged Pt in completing functional mobility to laundry room using RW with CGA provided for balance. Pt able to retrieve items from drier while balancing with single UE support on RW CGA using reacher to retrieve ~50% of items without LOB. Pt ambulated back to room using RW CGA.   Pt was left sitting EOB with call bell in reach, bed alarm on, and all needs met.    Therapy Documentation Precautions:  Precautions Precautions: Fall, Other (comment) Precaution Comments: 2L of O2 via nasal cannula baseline (pt reports using it  PRN), mild R hemi (LE>UE) Restrictions Weight Bearing Restrictions Per Provider Order: No   Therapy/Group: Individual Therapy  Clide Deutscher 06/21/2023, 2:48 PM

## 2023-06-21 NOTE — Progress Notes (Signed)
Occupational Therapy Session Note  Patient Details  Name: Luis [name] MRN: [id number] Date of Birth: 1970/09/27  Today's Date: 06/21/2023 OT Individual Time: 6387-5643 OT Individual Time Calculation (min): 70 min    Short Term Goals: Week 1:  OT Short Term Goal 1 (Week 1): STG=LTG d/t ELOS  Skilled Therapeutic Interventions/Progress Updates:     Pt received sitting up in recliner lightly sleeping waking upon OT arrival. Pt presenting to be in good spirits receptive to skilled OT session reporting 0/10 pain- OT offering intermittent rest breaks, repositioning, and therapeutic support to optimize participation in therapy session. Pt politely declining need for shower this AM and requesting to wait until PM session to complete BADLs. Focus this session activity tolerance, IADL retraining, and dynamic balance. Pt on 2 L O2 upon OT arrival and maintained on 2 L throughout OT session.   Pt requesting to wash his clothes during session. Engaged Pt in completing functional mobility throughout room using RW for endurance training and to simulate retrieving dirty clothes in home environment. Pt able to recall correct hand placement prior to sit>stand with verbal cues required to recall foot positioning and need for anterior weight shifting. Pt completed functional mobility throughout room and retrieved clothes from low draw using RW with CGA and min verbal cues provided for safety and positioning. Pt then completed ~100 ft of functional mobility to laundry room using RW with CGA provided for balance and mod verbal cues for R foot placement and weight shifting. Pt presenting with decreased step height/length on R LE and decreased lateral weight shifting during functional mobility. Pt able to maintain dynamic standing balance with single UE supported on RW and utilizing opposite UE to place clothing items into washing machine- seated rest break provided following. Pt completed additional 100 ft  functional mobility to therapy gym using RW with CGA provided for balance.  Engaged Pt in series of dynamic sitting and standing exercise using RW to increase balance strategies, B L/UE strength, and standing activity tolerance for increased safety and independence in BADLs. Listened to Pt preferred Christmas music during exercises to support improved participation and moral throughout session. Pt completed 2x10 reps of the following exercise:  -Marches in place -Seated marches -Step taps to cone R/L -Alternating step taps to cone -Seated knee flexion/extension -Seated toe taps (dorsiflexion) -Alternating R/L cross body punches -Alternating Overhead punches Pt presenting with decreased hip and knee flexion and decreased step height during exercises requiring intermittent min A to fully lift R LE or step foot to cone and during standing marches.   Engaged Pt in completing functional mobility to laundry room using RW with CGA provided for balance and mod verbal/tactile cues for lateral weight shifting and R foot placement. Pt stood at washer/drier with RW and transported clothes to drier using reacher to retrieve items with CGA provided for balance- Pt with good safety awareness and postioning during tasks.   Pt urgently requesting to use restroom. Pt ambulated back to room using RW and stood at toilet for continent void completing 3/3 toileting tasks CGA this session. Pt ambulated to recliner using RW with close supervision. Pt was left resting in recliner with call bell in reach, chair alarm on, and all needs met.    Therapy Documentation Precautions:  Precautions Precautions: Fall, Other (comment) Precaution Comments: 2L of O2 via nasal cannula baseline (pt reports using it PRN), mild R hemi (LE>UE) Restrictions Weight Bearing Restrictions Per Provider Order: No   Therapy/Group: Individual Therapy  Lawson Fiscal  P Woodson 06/21/2023, 7:59 AM

## 2023-06-21 NOTE — Progress Notes (Signed)
Rounding Note    Patient Name: Luis [name] Date of Encounter: 06/21/2023  [facility] Cardiologist: Bryan Lemma, MD   Subjective   Patient is overall feeling ok, still occasionally lightheaded. He had PT this AM and feels tired. BP this AM 152/67.   Inpatient Medications    Scheduled Meds:  clopidogrel  75 mg Oral Daily   dapagliflozin propanediol  10 mg Oral Daily   digoxin  0.125 mg Oral Daily   DULoxetine  30 mg Oral Daily   enoxaparin (LOVENOX) injection  40 mg Subcutaneous Q24H   insulin aspart  0-20 Units Subcutaneous TID PC & HS   insulin aspart  15 Units Subcutaneous TID WC   insulin glargine-yfgn  60 Units Subcutaneous QHS   isoniazid  300 mg Oral Daily   metoprolol succinate  150 mg Oral Daily   nystatin cream   Topical BID   oxymetazoline  1 spray Each Nare BID   pantoprazole  40 mg Oral Daily   prazosin  2 mg Oral QHS   pyridOXINE  100 mg Oral Daily   rosuvastatin  20 mg Oral Daily   spironolactone  25 mg Oral Daily   trazodone  300 mg Oral QHS   Continuous Infusions:  PRN Meds: acetaminophen **OR** acetaminophen (TYLENOL) oral liquid 160 mg/5 mL **OR** acetaminophen, HYDROcodone-acetaminophen, hydrOXYzine, loratadine, senna-docusate   Vital Signs    Vitals:   06/20/23 0500 06/20/23 1438 06/20/23 1914 06/21/23 0517  BP:  127/68 (!) 150/74 (!) 152/67  Pulse:  63 60 70  Resp:  18 18 18   Temp:  97.9 F (36.6 C) 98 F (36.7 C) 98.9 F (37.2 C)  TempSrc:   Oral   SpO2:  99% 96% 95%  Weight: (!) 145.2 kg     Height:        Intake/Output Summary (Last 24 hours) at 06/21/2023 0900 Last data filed at 06/21/2023 0340 Gross per 24 hour  Intake 772 ml  Output 1200 ml  Net -428 ml      06/20/2023    5:00 AM 06/16/2023    5:50 PM 06/12/2023    3:29 PM  Last 3 Weights  Weight (lbs) 320 lb 1.7 oz 312 lb 6.3 oz 305 lb  Weight (kg) 145.2 kg 141.7 kg 138.347 kg      Telemetry    N/A - Personally Reviewed  ECG    No new  - Personally Reviewed  Physical Exam   GEN: No acute distress.   Neck: No JVD Cardiac: RRR, no murmurs, rubs, or gallops.  Respiratory: Clear to auscultation bilaterally. GI: Soft, nontender, non-distended  MS: No edema; No deformity. Neuro:  Nonfocal  Psych: Normal affect   Labs    High Sensitivity Troponin:   Recent Labs  Lab 06/12/23 1715 06/12/23 1840  TROPONINIHS 15 17     Chemistry Recent Labs  Lab 06/16/23 1137 06/16/23 1824 06/18/23 0907 06/20/23 1219 06/21/23 0750  NA 136  --  134* 137  --   K 5.2*  --  3.8 3.7  --   CL 103  --  96* 99  --   CO2 19*  --  25 26  --   GLUCOSE 277*  --  318* 236*  --   BUN 18  --  21* 23*  --   CREATININE 1.47*   < > 1.06 0.97 0.97  CALCIUM 9.2  --  8.9 8.8*  --   PROT  --   --   --  6.8  --   ALBUMIN  --   --   --  3.4*  --   AST  --   --   --  19  --   ALT  --   --   --  21  --   ALKPHOS  --   --   --  57  --   BILITOT  --   --   --  0.4  --   GFRNONAA 56*   < > >60 >60 >60  ANIONGAP 14  --  13 12  --    < > = values in this interval not displayed.    Lipids No results for input(s): "CHOL", "TRIG", "HDL", "LABVLDL", "LDLCALC", "CHOLHDL" in the last 168 hours.  Hematology Recent Labs  Lab 06/15/23 0515 06/16/23 1824 06/21/23 0750  WBC 7.9 9.1 8.3  RBC 4.50 5.12 4.59  HGB 12.9* 14.3 13.1  HCT 39.4 44.7 40.7  MCV 87.6 87.3 88.7  MCH 28.7 27.9 28.5  MCHC 32.7 32.0 32.2  RDW 13.0 12.9 12.8  PLT 235 273 263   Thyroid No results for input(s): "TSH", "FREET4" in the last 168 hours.  BNP Recent Labs  Lab 06/20/23 1219  BNP 58.5    DDimer No results for input(s): "DDIMER" in the last 168 hours.   Radiology    No results found.  Cardiac Studies   Echo: 06/14/2023   IMPRESSIONS     1. Left ventricular ejection fraction, by estimation, is 25 to 30%. The  left ventricle has normal function. The left ventricle demonstrates global  hypokinesis. Left ventricular diastolic parameters are consistent with   Grade I diastolic dysfunction  (impaired relaxation).   2. Right ventricular systolic function is mildly reduced. The right  ventricular size is normal. Tricuspid regurgitation signal is inadequate  for assessing PA pressure.   3. The mitral valve is normal in structure. No evidence of mitral valve  regurgitation.   4. The aortic valve is tricuspid. Aortic valve regurgitation is not  visualized. No aortic stenosis is present.   5. The inferior vena cava is normal in size with greater than 50%  respiratory variability, suggesting right atrial pressure of 3 mmHg.   Patient Profile     52 y.o. male with a hx of chronic systolic heart failure (s/p reported BiV ICD) VT status post ICD (MDT), hypertension, hyperlipidemia, DM (poorly controlled), OSA on CPAP, substance abuse, PTSD, Jehovah's Witness who is being seen 06/18/2023 for the evaluation of CHF, meds   Assessment & Plan    HFrEF NICM - echo showed LVEF 25-30% with mildly decreased RV function - Bumex held 12/21 for feeling woozy/lightheaded - spiro  25mg  daily restarted - PTA Bumex 2mg  BID (per patient report) - continue Toprol 150mg  daily and digoxin 0.125mg  daily - Farxiga 10mg  daily - Entresto stopped 12/18 - also on prazosin - does not appear significantly volume up, but difficult given body habitus - can possibly restart Bumex today  H/o VT s/p ICD - followed by Dr. Nelly Laurence  Chronic respiratory failure -On 2L O2 chronically  HTN - BP labile  OSA - continue CPAP  Hyperkalemia - K 3.7  For questions or updates, please contact [facility] Please consult [email or web address] for contact info under        Signed, Judithe Keetch David Stall, PA-C  06/21/2023, 9:00 AM

## 2023-06-21 NOTE — Progress Notes (Signed)
Physical Therapy Session Note  Patient Details  Name: Luis [name] MRN: [id number] Date of Birth: [birth year]  Today's Date: 06/21/2023 PT Individual Time: 0800-0855 PT Individual Time Calculation (min): 55 min   Short Term Goals: Week 1:  PT Short Term Goal 1 (Week 1): = to LTGs based on ELOS  Skilled Therapeutic Interventions/Progress Updates:    Chart reviewed and pt agreeable to therapy. Pt received reclined in bed with 9/10 c/o pain inB knees. Also of note, pt required 2L O2 via [place] t/o. Session focused on functional transfers, NMR, balance, and amb quality and endurance to promote safe home access. Pt initiated session with amb of 15ft to therapy gym using light CGA + RW. Pt noted to still present with R circumduction, though less than previous PT session with less RLE drag. Pt then completed series of RLE toe taps to cone in seated position. Pt noted to require increased time for RLE, but able to complete toe tap to target. Pt then completed lateral weight shifts in standing with 10sec holds when weight shifted onto RLE. Pt required sup + RW during exercise. Pt reported increased RLE fatigue with exercise, but was able to complete task. Pt then amb back to room with light CGA fading to sup + RW. In room, pt amb to toilet and required light CGA during self-care toileting tasks. Pt then completed series of reaches outside BOS requiring close sup + RW + increased time. Session education emphasized CIR fall prevention policies. At end of session, pt was left seated in recliner with chair pad alarm, previously placed in room, engaged, nurse call bell and all needs in reach.     Therapy Documentation Precautions:  Precautions Precautions: Fall, Other (comment) Precaution Comments: 2L of O2 via nasal cannula baseline (pt reports using it PRN), mild R hemi (LE>UE) Restrictions Weight Bearing Restrictions Per Provider Order: No   Therapy/Group: Individual Therapy  Dionne Milo, PT,  DPT 06/21/2023, 12:06 PM

## 2023-06-22 DIAGNOSIS — F1410 Cocaine abuse, uncomplicated: Secondary | ICD-10-CM | POA: Diagnosis not present

## 2023-06-22 DIAGNOSIS — I5023 Acute on chronic systolic (congestive) heart failure: Secondary | ICD-10-CM | POA: Diagnosis not present

## 2023-06-22 DIAGNOSIS — I10 Essential (primary) hypertension: Secondary | ICD-10-CM | POA: Diagnosis not present

## 2023-06-22 DIAGNOSIS — I420 Dilated cardiomyopathy: Secondary | ICD-10-CM | POA: Diagnosis not present

## 2023-06-22 LAB — BASIC METABOLIC PANEL
Anion gap: 10 (ref 5–15)
BUN: 17 mg/dL (ref 6–20)
CO2: 22 mmol/L (ref 22–32)
Calcium: 9 mg/dL (ref 8.9–10.3)
Chloride: 105 mmol/L (ref 98–111)
Creatinine, Ser: 1.02 mg/dL (ref 0.61–1.24)
GFR, Estimated: >60 mL/min (ref >60)
Glucose, Bld: 249 mg/dL — ABNORMAL HIGH (ref 70–99)
Potassium: 4.3 mmol/L (ref 3.5–5.1)
Sodium: 137 mmol/L (ref 135–145)

## 2023-06-22 LAB — GLUCOSE, CAPILLARY
Glucose-Capillary: 148 mg/dL — ABNORMAL HIGH (ref 70–99)
Glucose-Capillary: 153 mg/dL — ABNORMAL HIGH (ref 70–99)
Glucose-Capillary: 297 mg/dL — ABNORMAL HIGH (ref 70–99)
Glucose-Capillary: 217 mg/dL — ABNORMAL HIGH (ref 70–99)

## 2023-06-22 LAB — DIGOXIN LEVEL: Digoxin Level: 0.5 ng/mL — ABNORMAL LOW (ref 0.8–2.0)

## 2023-06-22 NOTE — Progress Notes (Signed)
Physical Therapy Session Note  Patient Details  Name: Luis [name] MRN: [id number] Date of Birth: 05-Aug-1970  Today's Date: 06/22/2023 PT Individual Time: 0918-1008 PT Individual Time Calculation (min): 50 min   Short Term Goals: Week 1:  PT Short Term Goal 1 (Week 1): = to LTGs based on ELOS   Skilled Therapeutic Interventions/Progress Updates:  Patient seated upright on EOB on entrance to room. Eating popcorn. Patient alert and agreeable to PT session.   Patient with no pain complaint at start of session.  Therapeutic Activity: Transfers: Pt performed sit<>stand and stand pivot transfers throughout session with CGA with one instance for need of MinA for power up. Does require vc for pushing from seat and increasing forward weight shift with hip hinge rather than trunk flexion.   Ambulatory toilet transfer with RW performed to reach bathroom. Continent void standing at toilet. Manages LB clothing with supervision/ Mod I.   Gait Training:  Gait training performed in hallways for activity tolerance training, NMR, gait training, and strengthening. Is able to ambulate 23' x1/ 133' x1/ 47' x1 using RW with CGA/ supervision. VC for increasing hip/ knee flexion and DF. Pt relates difficulty. Pt ends each bout d/t fatigue and SpO2 checked upon sitting  with readings WFL throughout and pt happy with numbers. (SpO2 96%, pulse 81; SpO2 95%, pulse 85; SpO2 94-95%, pulse 86)  Patient seated upright in recliner at end of session with brakes locked, seat pad alarm set, and all needs within reach.   Therapy Documentation Precautions:  Precautions Precautions: Fall, Other (comment) Precaution Comments: 2L of O2 via nasal cannula baseline (pt reports using it PRN), mild R hemi (LE>UE) Restrictions Weight Bearing Restrictions Per Provider Order: No  Pain:  No pain related this session.   Therapy/Group: Individual Therapy  Loel Dubonnet PT, DPT, CSRS 06/22/2023, 10:28 AM

## 2023-06-22 NOTE — Progress Notes (Signed)
Physical Therapy Session Note  Patient Details  Name: Luis [name] MRN: [id number] Date of Birth: 1970/10/13  Today's Date: 06/22/2023 PT Individual Time: 1115-1205 PT Individual Time Calculation (min): 50 min   Short Term Goals: Week 1:  PT Short Term Goal 1 (Week 1): = to LTGs based on ELOS Week 2:     Skilled Therapeutic Interventions/Progress Updates:      Therapy Documentation Precautions:  Precautions Precautions: Fall, Other (comment) Precaution Comments: 2L of O2 via nasal cannula baseline (pt reports using it PRN), mild R hemi (LE>UE) Restrictions Weight Bearing Restrictions Per Provider Order: No General:   Vital Signs:   Pain:    Therapy/Group: Individual Therapy  Loel Dubonnet PT, DPT, CSRS 06/22/2023, 5:52 PM

## 2023-06-22 NOTE — Progress Notes (Addendum)
Patient ID: Luis [name], male   DOB: Nov 12, 1970, 52 y.o.   MRN: [id number]   [facility] referral sent to [facility] Orders sent

## 2023-06-22 NOTE — Progress Notes (Signed)
Renal function good today with normal serum creatinine and dig level 0.5.  Will continue current medications and add back his home dose of Bumex 2 mg twice daily.  Please see note from today for discharge cardiac medications.  Will sign off call with any questions

## 2023-06-22 NOTE — Progress Notes (Addendum)
Patient ID: Luis [name], male   DOB: 1971-03-31, 52 y.o.   MRN: [id number]  Team Conference Report to Patient/Family  Team Conference discussion was reviewed with the patient and caregiver, including goals, any changes in plan of care and target discharge date.  Patient and caregiver express understanding and are in agreement.  The patient has a target discharge date of 06/25/23.   SW met with patient ans provide conference updates. Patient pleased with d/c on Friday. Letter from physician provided to support 24/7 assistance in the home. Family education arranged for Thursday. No additional questions or concerns.  Patient prefers to use [facility] pharmacy at discharge.   [name] 06/22/2023, 9:30 AM

## 2023-06-22 NOTE — Group Note (Signed)
Patient Details Name: Luis [name] MRN: [id number] DOB: March 30, 1971 Today's Date: 06/22/2023  Time Calculation: OT Group Time Calculation OT Group Start Time: 1330 OT Group Stop Time: 1430 OT Group Time Calculation (min): 60 min      Group Description: Dance Group: Pt participated in dance group with an emphasis on social interaction, motor planning, increasing overall activity tolerance and bimanual tasks. All songs were selected by group members. Dance moves included AROM of BUE/BLE gross motor movements with an emphasis on building functional endurance.    Individual level documentation: Patient completed group from sitting level. Patientt needed supervision to complete various dance moves with cues for motivation and R LE placement.  Patient needed min modifications during group.  Pain: Pain Assessment Pain Scale: 0-10 Pain Score: 0-No pain  Precautions:  Falls   Tollie Pizza Woodson 06/22/2023, 3:29 PM

## 2023-06-22 NOTE — Progress Notes (Signed)
   06/22/23 2209  BiPAP/CPAP/SIPAP  $ Non-Invasive Home Ventilator  Subsequent  BiPAP/CPAP/SIPAP Pt Type Adult  BiPAP/CPAP/SIPAP Resmed  Mask Type Full face mask  Mask Size Medium  PEEP 11 cmH20  FiO2 (%) 28 %  Flow Rate 2 lpm  Patient Home Equipment No  Auto Titrate No  CPAP/SIPAP surface wiped down Yes

## 2023-06-22 NOTE — Progress Notes (Signed)
Rounding Note    Patient Name: Luis [name] Date of Encounter: 06/22/2023  [facility] Cardiologist: Bryan Lemma, MD   Subjective   Patient is overall feeling better today. Bps OK over the last 24 hours. He denies chest pain or SOB.AM labs pending.  Inpatient Medications    Scheduled Meds:  carvedilol  3.125 mg Oral BID WC   clopidogrel  75 mg Oral Daily   dapagliflozin propanediol  10 mg Oral Daily   digoxin  0.125 mg Oral Daily   DULoxetine  30 mg Oral Daily   enoxaparin (LOVENOX) injection  40 mg Subcutaneous Q24H   insulin aspart  0-20 Units Subcutaneous TID PC & HS   insulin aspart  15 Units Subcutaneous TID WC   insulin glargine-yfgn  60 Units Subcutaneous QHS   isoniazid  300 mg Oral Daily   nystatin cream   Topical BID   pantoprazole  40 mg Oral Daily   prazosin  2 mg Oral QHS   pyridOXINE  100 mg Oral Daily   rosuvastatin  20 mg Oral Daily   sacubitril-valsartan  1 tablet Oral BID   spironolactone  25 mg Oral Daily   trazodone  300 mg Oral QHS   Continuous Infusions:  PRN Meds: acetaminophen **OR** acetaminophen (TYLENOL) oral liquid 160 mg/5 mL **OR** acetaminophen, HYDROcodone-acetaminophen, hydrOXYzine, loratadine, melatonin, senna-docusate   Vital Signs    Vitals:   06/20/23 1914 06/21/23 0517 06/21/23 1414 06/21/23 2129  BP: (!) 150/74 (!) 152/67 126/60 (!) 149/66  Pulse: 60 70 63 71  Resp: 18 18 19 16   Temp: 98 F (36.7 C) 98.9 F (37.2 C)  98.4 F (36.9 C)  TempSrc: Oral   Oral  SpO2: 96% 95% 95% 97%  Weight:      Height:        Intake/Output Summary (Last 24 hours) at 06/22/2023 0816 Last data filed at 06/22/2023 0737 Gross per 24 hour  Intake 920 ml  Output 2150 ml  Net -1230 ml      06/20/2023    5:00 AM 06/16/2023    5:50 PM 06/12/2023    3:29 PM  Last 3 Weights  Weight (lbs) 320 lb 1.7 oz 312 lb 6.3 oz 305 lb  Weight (kg) 145.2 kg 141.7 kg 138.347 kg      Telemetry    N/A - Personally  Reviewed  ECG    No new - Personally Reviewed  Physical Exam   GEN: No acute distress.   Neck: No JVD Cardiac: RRR, no murmurs, rubs, or gallops.  Respiratory: Clear to auscultation bilaterally. GI: Soft, nontender, non-distended  MS: No edema; No deformity. Neuro:  Nonfocal  Psych: Normal affect   Labs    High Sensitivity Troponin:   Recent Labs  Lab 06/12/23 1715 06/12/23 1840  TROPONINIHS 15 17     Chemistry Recent Labs  Lab 06/16/23 1137 06/16/23 1824 06/18/23 0907 06/20/23 1219 06/21/23 0750  NA 136  --  134* 137  --   K 5.2*  --  3.8 3.7  --   CL 103  --  96* 99  --   CO2 19*  --  25 26  --   GLUCOSE 277*  --  318* 236*  --   BUN 18  --  21* 23*  --   CREATININE 1.47*   < > 1.06 0.97 0.97  CALCIUM 9.2  --  8.9 8.8*  --   PROT  --   --   --  6.8  --   ALBUMIN  --   --   --  3.4*  --   AST  --   --   --  19  --   ALT  --   --   --  21  --   ALKPHOS  --   --   --  57  --   BILITOT  --   --   --  0.4  --   GFRNONAA 56*   < > >60 >60 >60  ANIONGAP 14  --  13 12  --    < > = values in this interval not displayed.    Lipids No results for input(s): "CHOL", "TRIG", "HDL", "LABVLDL", "LDLCALC", "CHOLHDL" in the last 168 hours.  Hematology Recent Labs  Lab 06/16/23 1824 06/21/23 0750  WBC 9.1 8.3  RBC 5.12 4.59  HGB 14.3 13.1  HCT 44.7 40.7  MCV 87.3 88.7  MCH 27.9 28.5  MCHC 32.0 32.2  RDW 12.9 12.8  PLT 273 263   Thyroid No results for input(s): "TSH", "FREET4" in the last 168 hours.  BNP Recent Labs  Lab 06/20/23 1219  BNP 58.5    DDimer No results for input(s): "DDIMER" in the last 168 hours.   Radiology    No results found.  Cardiac Studies    Echo: 06/14/2023   IMPRESSIONS     1. Left ventricular ejection fraction, by estimation, is 25 to 30%. The  left ventricle has normal function. The left ventricle demonstrates global  hypokinesis. Left ventricular diastolic parameters are consistent with  Grade I diastolic dysfunction   (impaired relaxation).   2. Right ventricular systolic function is mildly reduced. The right  ventricular size is normal. Tricuspid regurgitation signal is inadequate  for assessing PA pressure.   3. The mitral valve is normal in structure. No evidence of mitral valve  regurgitation.   4. The aortic valve is tricuspid. Aortic valve regurgitation is not  visualized. No aortic stenosis is present.   5. The inferior vena cava is normal in size with greater than 50%  respiratory variability, suggesting right atrial pressure of 3 mmHg.   Patient Profile     52 y.o. male with a hx of chronic systolic heart failure (s/p reported BiV ICD) VT status post ICD (MDT), hypertension, hyperlipidemia, DM (poorly controlled), OSA on CPAP, substance abuse, PTSD, Jehovah's Witness who is being seen 06/18/2023 for the evaluation of CHF, meds   Assessment & Plan   HFrEF NICM - echo showed LVEF 25-30% with mildly decreased RV function - Bumex held 12/21 for feeling woozy/lightheaded - PTA Bumex 2mg  BID (per patient report) - spiro  25mg  daily  - digoxin 0.125mg  daily - Farxiga 10mg  daily - coreg 3.125mg BID - Entresto stopped 12/18, but restarted 06/22/23 Entresto 97-103mg BID - also on prazosin - does not appear significantly volume up, but difficult given body habitus - AM labs pending, consider restart Bumex   H/o VT s/p ICD - followed by Dr. Nelly Laurence   Chronic respiratory failure -On 2L O2 chronically   HTN - BP labile   OSA - continue CPAP   Hyperkalemia - K 3.7  For questions or updates, please contact [facility] Please consult [email or web address] for contact info under        Signed, Caidyn Blossom David Stall, PA-C  06/22/2023, 8:16 AM

## 2023-06-22 NOTE — Progress Notes (Signed)
PROGRESS NOTE   Subjective/Complaints:  Pt feels ok today , discusseed d/c date , pt not sure if he feels ready.  Received call from [place] SW and would like to speak to Rehab SW to help coordinate home care .   Spoke about importance of cardiology f/u , sees Dr Shirlee Latch at Heart failure clinic  Review of systems  Negative chest pain, shortness of breath, nausea vomiting diarrhea  Objective:   No results found. Recent Labs    06/21/23 0750  WBC 8.3  HGB 13.1  HCT 40.7  PLT 263    Recent Labs    06/20/23 1219 06/21/23 0750 06/22/23 0846  NA 137  --  137  K 3.7  --  4.3  CL 99  --  105  CO2 26  --  22  GLUCOSE 236*  --  249*  BUN 23*  --  17  CREATININE 0.97 0.97 1.02  CALCIUM 8.8*  --  9.0    Intake/Output Summary (Last 24 hours) at 06/22/2023 1033 Last data filed at 06/22/2023 0737 Gross per 24 hour  Intake 920 ml  Output 1950 ml  Net -1030 ml        Physical Exam: Vital Signs Blood pressure (!) 149/66, pulse 71, temperature 98.4 F (36.9 C), temperature source Oral, resp. rate 16, height 5\' 6"  (1.676 m), weight (!) 145.2 kg, SpO2 97%.   General: No acute distress.  Sitting up in bedside chair.   Psych: Mood and affect are appropriate Heart: Regular rate and rhythm no rubs murmurs or extra sounds Lungs: Clear to auscultation, breathing unlabored, no rales or wheezes.  On 2 L nasal cannula. Abdomen: Positive bowel sounds, soft nontender to palpation, protuberant. Extremities: 2+ edema bilateral lower extremities. Skin: No evidence of breakdown, no evidence of rash Neurologic: Awake, alert, and oriented x 3.  No apparent deficits. Cranial nerves II through XII intact,  motor strength 4/5 throughout in bilateral upper and lower extremities Sensory exam normal sensation to light touch and proprioception in bilateral upper and lower extremities Musculoskeletal: Full range of motion in all 4 extremities. No  joint swelling   Assessment/Plan: 1. Functional deficits which require 3+ hours per day of interdisciplinary therapy in a comprehensive inpatient rehab setting. Physiatrist is providing close team supervision and 24 hour management of active medical problems listed below. Physiatrist and rehab team continue to assess barriers to discharge/monitor patient progress toward functional and medical goals  Care Tool:  Bathing    Body parts bathed by patient: Right arm, Left arm, Chest, Abdomen, Front perineal area, Buttocks, Right upper leg, Left upper leg, Right lower leg, Left lower leg, Face         Bathing assist Assist Level: Supervision/Verbal cueing     Upper Body Dressing/Undressing Upper body dressing   What is the patient wearing?: Pull over shirt    Upper body assist Assist Level: Supervision/Verbal cueing    Lower Body Dressing/Undressing Lower body dressing      What is the patient wearing?: Pants, Underwear/pull up     Lower body assist Assist for lower body dressing: Supervision/Verbal cueing     Toileting Toileting    Toileting  assist Assist for toileting: Supervision/Verbal cueing     Transfers Chair/bed transfer  Transfers assist     Chair/bed transfer assist level: Supervision/Verbal cueing     Locomotion Ambulation   Ambulation assist      Assist level: Contact Guard/Touching assist Assistive device: Walker-rolling Max distance: 150'   Walk 10 feet activity   Assist     Assist level: Supervision/Verbal cueing Assistive device: Walker-rolling   Walk 50 feet activity   Assist    Assist level: Contact Guard/Touching assist Assistive device: Walker-rolling    Walk 150 feet activity   Assist Walk 150 feet activity did not occur: Safety/medical concerns  Assist level: Contact Guard/Touching assist Assistive device: Walker-rolling    Walk 10 feet on uneven surface  activity   Assist     Assist level: Minimal  Assistance - Patient > 75% Assistive device: Walker-rolling   Wheelchair     Assist Is the patient using a wheelchair?: Yes Type of Wheelchair: Manual    Wheelchair assist level: Dependent - Patient 0%      Wheelchair 50 feet with 2 turns activity    Assist        Assist Level: Dependent - Patient 0%   Wheelchair 150 feet activity     Assist      Assist Level: Dependent - Patient 0%   Blood pressure (!) 149/66, pulse 71, temperature 98.4 F (36.9 C), temperature source Oral, resp. rate 16, height 5\' 6"  (1.676 m), weight (!) 145.2 kg, SpO2 97%.  Medical Problem List and Plan: 1. 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  Mild  inconsistent deficits             -patient may shower             -ELOS/Goals: 7 days             Plan D/C 12/27  2.  Antithrombotics: -DVT/anticoagulation:  Pharmaceutical: Lovenox             -antiplatelet therapy: Plavix 75 mg daily.  Patient is allergic to aspirin 3. Pain Management: Hydrocodone 1 tablet every 4 hours as needed 4. Mood/Behavior/Sleep: Cymbalta 30 mg daily, trazodone 200 mg nightly, Atarax as needed             -antipsychotic agents: N/A  -12/23: Waking up in the middle of the night, adding melatonin 5 mg as needed  5. Neuropsych/cognition: This patient is capable of making decisions on his own behalf. 6. Skin/Wound Care: Routine skin checks 7. Fluids/Electrolytes/Nutrition: Routine in and outs with follow-up chemistries  12-22: Labs appear stable  8.  Acute/chronic respiratory failure.  Monitor oxygen saturations  -Respiratory status has been stable 9.  OSA.  CPAP 10.  Morbid obesity.  BMI 50.75.  Dietary follow-up 11.  Poorly controlled diabetes mellitus.  Hemoglobin A1c 12. Increase  Semglee 50 units daily, NovoLog 15 units 3 times daily.  Diabetic teaching   - Farxiga 10 mg daily per cardiology for  GDMT  -Uncontrolled increasing Semglee 12/21 to 55U, increase to 60U 12/22-received new dose last night, hold on adjustments today    CBG (last 3)  Recent Labs    06/21/23 1658 06/21/23 2114 06/22/23 0659  GLUCAP 153* 263* 148*  Controlled   12.  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 Farxiga 10 mg daily, Lanoxin 0.125 mg daily, Bumex 2 mg twice daily, Aldactone 25  mg daily.  Cardiology consulted to adjust GDMT and ensure f/u , not sure if pt may need Heart failure team to manageMonitor for any signs of fluid overload. Daily weights ordered Filed Weights   06/16/23 1750 06/20/23 0500  Weight: (!) 141.7 kg (!) 145.2 kg   12-23: Weight uptrending-cardiology thinks this is inaccurate given -9L urine output since admission.  held Bumex 1 time over the weekend due to hypotension/dizziness, added back daily  (was prior to twice daily)  Resumed spironolactone 12.5 mg daily yesterday  Increase Bumex back to 2 mg twice daily  Switch Toprol to home carvedilol 3.125 mg twice daily  Restart home Entresto 97-23 mg twice daily  Continue digoxin  13.  Hypertension. Continue Toprol-XL 150 mg daily, Minipress 2 mg nightly.  Monitor with increased mobility    06/21/2023    9:29 PM 06/21/2023    2:14 PM 06/21/2023    5:17 AM  Vitals with BMI  Systolic 149 126 161  Diastolic 66 60 67  Pulse 71 63 70   - 12/24 Vitals from this am pnd  14.  Hyperlipidemia.  Continue Crestor 15.  GERD.  Continue Protonix 16.  History of tobacco as well as polysubstance abuse.  Counseling  17.  Mild Hyper K+ likely due to aldactone resolved     Latest Ref Rng & Units 06/22/2023    8:46 AM 06/21/2023    7:50 AM 06/20/2023   12:19 PM  BMP  Glucose 70 - 99 mg/dL 096   045   BUN 6 - 20 mg/dL 17   23   Creatinine 4.09 - 1.24 mg/dL 8.11  9.14  7.82   Sodium 135 - 145 mmol/L 137   137   Potassium 3.5 - 5.1 mmol/L 4.3   3.7   Chloride 98 - 111 mmol/L 105   99   CO2 22 -  32 mmol/L 22   26   Calcium 8.9 - 10.3 mg/dL 9.0   8.8      LOS: 6 days A FACE TO FACE EVALUATION WAS PERFORMED  Erick Colace 06/22/2023, 10:33 AM

## 2023-06-22 NOTE — Progress Notes (Signed)
Occupational Therapy Session Note  Patient Details  Name: Luis [name] MRN: [id number] Date of Birth: 1970-07-15  Today's Date: 06/22/2023 OT Individual Time: 7846-9629 OT Individual Time Calculation (min): 42 min  OT Individual  Missed Time Calculation (min): 18 min   Short Term Goals: Week 1:  OT Short Term Goal 1 (Week 1): STG=LTG d/t ELOS  Skilled Therapeutic Interventions/Progress Updates:     Pt received returning from restroom with nursing staff upon OT arrival. Pt presenting to be fatigued following group therapy session, however receptive to skilled OT session reporting 9/10 pain d/t muscle soreness in knees and B UEs- OT offering intermittent rest breaks, repositioning, and therapeutic support to optimize participation in therapy session. Pt reporting feeling "down" d/t not having family present around the holidays and d/t being in the hospital. Provided therapeutic support and engaged Pt in light hearted conversation to support improved moral. Pt requesting to wait until therapy session to take pain medications. Therapy session completed in Pt's room with all tasks completed in seated position for pain management and for motivation. Engage Pt in completing simulated medication management task to address cognitive deficits and increase Pt's independence in IADLs. Education provided on purpose of pill box to stay organize and support recall to take medication with Pt receptive to education reporting he has a pill box at home he used prior to hospital admission. Education also provided on medication adherence and side effects of not taking medications as prescribed with Pt receptive to education. Provided Pt with 5 simulated medications (beads in pill bottles) with written directions on each pill bottle. Pt instructed to sort medications into pill box to simulate preparing his personal medications for at home. Pt able to sort 5/5 medications with min questioning cues to follow 2 step  medication instruction with 100% accuracy. Pt completed task with increased time and min dropping of medications. Pt then completed the following putty exercises using yellow (soft) therputty with handouts provided for HEP to support learning:  -Gross grasp -Thumb Flexion -Digit Extension  -Digit adduction -Digit abduction -Individual pinch -Lateral pinch Pt fatigued following and requesting to end session early. Pt was left resting in recliner with call bell in reach, chair alarm on, and all needs met. Missed 18 minutes of skilled OT treatment d/t fatigue. Will attempt to make up time as schedule allows.   Therapy Documentation Precautions:  Precautions Precautions: Fall, Other (comment) Precaution Comments: 2L of O2 via nasal cannula baseline (pt reports using it PRN), mild R hemi (LE>UE) Restrictions Weight Bearing Restrictions Per Provider Order: No  Therapy/Group: Individual Therapy  Clide Deutscher 06/22/2023, 2:46 PM

## 2023-06-22 NOTE — Progress Notes (Signed)
Patient ID: Luis [name], male   DOB: May 03, 1971, 52 y.o.   MRN: [id number]  Due to no caregiver support over the weekend. Patient d/c moved to 06/28/23.

## 2023-06-23 DIAGNOSIS — I639 Cerebral infarction, unspecified: Secondary | ICD-10-CM | POA: Diagnosis not present

## 2023-06-23 DIAGNOSIS — I5042 Chronic combined systolic (congestive) and diastolic (congestive) heart failure: Secondary | ICD-10-CM | POA: Diagnosis not present

## 2023-06-23 DIAGNOSIS — J9610 Chronic respiratory failure, unspecified whether with hypoxia or hypercapnia: Secondary | ICD-10-CM | POA: Diagnosis not present

## 2023-06-23 LAB — BASIC METABOLIC PANEL
Anion gap: 11 (ref 5–15)
BUN: 16 mg/dL (ref 6–20)
CO2: 22 mmol/L (ref 22–32)
Calcium: 9 mg/dL (ref 8.9–10.3)
Chloride: 100 mmol/L (ref 98–111)
Creatinine, Ser: 0.97 mg/dL (ref 0.61–1.24)
GFR, Estimated: >60 mL/min (ref >60)
Glucose, Bld: 264 mg/dL — ABNORMAL HIGH (ref 70–99)
Potassium: 4 mmol/L (ref 3.5–5.1)
Sodium: 133 mmol/L — ABNORMAL LOW (ref 135–145)

## 2023-06-23 LAB — GLUCOSE, CAPILLARY
Glucose-Capillary: 196 mg/dL — ABNORMAL HIGH (ref 70–99)
Glucose-Capillary: 278 mg/dL — ABNORMAL HIGH (ref 70–99)
Glucose-Capillary: 209 mg/dL — ABNORMAL HIGH (ref 70–99)
Glucose-Capillary: 255 mg/dL — ABNORMAL HIGH (ref 70–99)

## 2023-06-23 MED ORDER — INSULIN GLARGINE-YFGN 100 UNIT/ML ~~LOC~~ SOLN
65.0000 [IU] | Freq: Every day | SUBCUTANEOUS | Status: DC
  Administered 2023-06-23: 65 [IU] via SUBCUTANEOUS
  Filled 2023-06-23 (×2): qty 0.65

## 2023-06-23 NOTE — Progress Notes (Signed)
Patient ID: Luis [name], male   DOB: [birth year], 52 y.o.   MRN: [id number]  SW informed [facility] on [facility]. SW waiting on follow-up if anything else is needed.   Cecile Sheerer, MSW, LCSW Office: [phone] Cell: [phone] Fax: [phone]

## 2023-06-23 NOTE — Progress Notes (Signed)
Pt refused his Lovenox injection this afternoon. Pt states "Idk why I need to keep taking this and I'm up moving around. I dont want it" Educated pt about the medication and how it relates to his diagnose. Pt education understood and voiced that he still did not want to take the injection. No further concerns at this time. Call bell in reach.

## 2023-06-23 NOTE — Progress Notes (Signed)
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 Recent Labs    06/21/23 0750  WBC 8.3  HGB 13.1  HCT 40.7  PLT 263    Recent Labs    06/22/23 0846 06/23/23 0723  NA 137 133*  K 4.3 4.0  CL 105 100  CO2 22 22  GLUCOSE 249* 264*  BUN 17 16  CREATININE 1.02 0.97  CALCIUM 9.0 9.0    Intake/Output Summary (Last 24 hours) at 06/23/2023 0830 Last data filed at 06/23/2023 0219 Gross per 24 hour  Intake 240 ml  Output 1200 ml  Net -960 ml        Physical Exam: Vital Signs Blood pressure (!) 141/64, pulse 77, temperature (!) 97.5 F (36.4 C), temperature source Oral, resp. rate 18, height 5\' 6"  (1.676 m), weight (!) 145.2 kg, SpO2 94%.   General: No acute distress.  Sitting up in bedside chair.   Psych: Mood and affect are appropriate Heart: Regular rate and rhythm no rubs murmurs or extra sounds Lungs: Clear to auscultation, breathing unlabored, no rales or wheezes.  On 2 L nasal cannula. Abdomen: Positive bowel sounds, soft nontender to palpation, protuberant. Extremities: 2+ edema bilateral lower extremities. Skin: No evidence of breakdown, no evidence of rash Neurologic: Awake, alert, and oriented x 3.   Cranial nerves II through XII intact,  motor strength 4/5 throughout in bilateral upper and lower extremities  Musculoskeletal: Full range of motion in all 4 extremities. No joint swelling   Assessment/Plan: 1. Functional deficits which require 3+ hours per day of interdisciplinary therapy in a comprehensive inpatient rehab setting. Physiatrist is providing close team supervision and 24 hour management of active medical problems listed below. Physiatrist and  rehab team continue to assess barriers to discharge/monitor patient progress toward functional and medical goals  Care Tool:  Bathing    Body parts bathed by patient: Right arm, Left arm, Chest, Abdomen, Front perineal area, Buttocks, Right upper leg, Left upper leg, Right lower leg, Left lower leg, Face         Bathing assist Assist Level: Supervision/Verbal cueing     Upper Body Dressing/Undressing Upper body dressing   What is the patient wearing?: Pull over shirt    Upper body assist Assist Level: Supervision/Verbal cueing    Lower Body Dressing/Undressing Lower body dressing      What is the patient wearing?: Pants, Underwear/pull up     Lower body assist Assist for lower body dressing: Supervision/Verbal cueing     Toileting Toileting    Toileting assist Assist for toileting: Supervision/Verbal cueing     Transfers Chair/bed transfer  Transfers assist     Chair/bed transfer assist level: Supervision/Verbal cueing     Locomotion Ambulation   Ambulation assist      Assist level: Contact Guard/Touching assist Assistive device: Walker-rolling Max distance: 150'   Walk 10 feet activity   Assist  Assist level: Supervision/Verbal cueing Assistive device: Walker-rolling   Walk 50 feet activity   Assist    Assist level: Contact Guard/Touching assist Assistive device: Walker-rolling    Walk 150 feet activity   Assist Walk 150 feet activity did not occur: Safety/medical concerns  Assist level: Contact Guard/Touching assist Assistive device: Walker-rolling    Walk 10 feet on uneven surface  activity   Assist     Assist level: Minimal Assistance - Patient > 75% Assistive device: Walker-rolling   Wheelchair     Assist Is the patient using a wheelchair?: Yes Type of Wheelchair: Manual    Wheelchair assist level: Dependent - Patient 0%      Wheelchair 50 feet with 2 turns activity    Assist        Assist  Level: Dependent - Patient 0%   Wheelchair 150 feet activity     Assist      Assist Level: Dependent - Patient 0%   Blood pressure (!) 141/64, pulse 77, temperature (!) 97.5 F (36.4 C), temperature source Oral, resp. rate 18, height 5\' 6"  (1.676 m), weight (!) 145.2 kg, SpO2 94%.  Medical Problem List and Plan: 1. 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  Mild  inconsistent deficits             -patient may shower             -ELOS/Goals: 7 days             Plan D/C 12/30  2.  Antithrombotics: -DVT/anticoagulation:  Pharmaceutical: Lovenox             -antiplatelet therapy: Plavix 75 mg daily.  Patient is allergic to aspirin 3. Pain Management: Hydrocodone 1 tablet every 4 hours as needed 4. Mood/Behavior/Sleep: Cymbalta 30 mg daily, trazodone 200 mg nightly, Atarax as needed             -antipsychotic agents: N/A  -12/23: Waking up in the middle of the night, adding melatonin 5 mg as needed  5. Neuropsych/cognition: This patient is capable of making decisions on his own behalf. 6. Skin/Wound Care: Routine skin checks 7. Fluids/Electrolytes/Nutrition: Routine in and outs with follow-up chemistries  12-22: Labs appear stable  8.  Acute/chronic respiratory failure.  Monitor oxygen saturations  -Respiratory status has been stable 9.  OSA.  CPAP 10.  Morbid obesity.  BMI 50.75.  Dietary follow-up 11.  Poorly controlled diabetes mellitus.  Hemoglobin A1c 12. Increase  Semglee 50 units daily, NovoLog 15 units 3 times daily.  Diabetic teaching   - Farxiga 10 mg daily per cardiology for GDMT  -Uncontrolled increasing Semglee 12/21 to 55U, increase to 60U 12/22-received new dose last night, hold on adjustments today    CBG (last 3)  Recent Labs    06/22/23 1657 06/22/23 2041 06/23/23 0559  GLUCAP 297* 217* 196*  Am still elevated increase semglee to 65U  12.   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 Farxiga 10 mg daily, Lanoxin 0.125 mg daily, Bumex 2 mg twice daily, Aldactone 25 mg daily.  Cardiology consulted to adjust GDMT and ensure f/u , not sure if Luis [name] may need Heart failure team to manageMonitor for any signs of fluid overload. Daily weights ordered Filed Weights   06/16/23 1750 06/20/23 0500  Weight: (!) 141.7 kg (!) 145.2 kg   12-23: Weight uptrending-cardiology thinks this is inaccurate given -9L  urine output since admission.  held Bumex 1 time over the weekend due to hypotension/dizziness, added back daily  (was prior to twice daily)  Resumed spironolactone 12.5 mg daily yesterday  Increase Bumex back to 2 mg twice daily  Switch Toprol to home carvedilol 3.125 mg twice daily  Restart home Entresto 97-23 mg twice daily  Continue digoxin  13.  Hypertension. Continue Toprol-XL 150 mg daily, Minipress 2 mg nightly.  Monitor with increased mobility    06/23/2023    5:46 AM 06/22/2023    7:46 PM 06/22/2023    1:16 PM  Vitals with BMI  Systolic 141 132 981  Diastolic 64 51 62  Pulse 77 78 81  12/25 control is good   14.  Hyperlipidemia.  Continue Crestor 15.  GERD.  Continue Protonix 16.  History of tobacco as well as polysubstance abuse.  Counseling  17.  Mild Hyper K+ likely due to aldactone resolved     Latest Ref Rng & Units 06/23/2023    7:23 AM 06/22/2023    8:46 AM 06/21/2023    7:50 AM  BMP  Glucose 70 - 99 mg/dL 191  478    BUN 6 - 20 mg/dL 16  17    Creatinine 2.95 - 1.24 mg/dL 6.21  3.08  6.57   Sodium 135 - 145 mmol/L 133  137    Potassium 3.5 - 5.1 mmol/L 4.0  4.3    Chloride 98 - 111 mmol/L 100  105    CO2 22 - 32 mmol/L 22  22    Calcium 8.9 - 10.3 mg/dL 9.0  9.0       LOS: 7 days A FACE TO FACE EVALUATION WAS PERFORMED  Victorino Sparrow Kenzo Ozment 06/23/2023, 8:30 AM

## 2023-06-24 DIAGNOSIS — I639 Cerebral infarction, unspecified: Secondary | ICD-10-CM | POA: Diagnosis not present

## 2023-06-24 DIAGNOSIS — I5042 Chronic combined systolic (congestive) and diastolic (congestive) heart failure: Secondary | ICD-10-CM | POA: Diagnosis not present

## 2023-06-24 DIAGNOSIS — J9610 Chronic respiratory failure, unspecified whether with hypoxia or hypercapnia: Secondary | ICD-10-CM | POA: Diagnosis not present

## 2023-06-24 LAB — BASIC METABOLIC PANEL
Anion gap: 9 (ref 5–15)
BUN: 15 mg/dL (ref 6–20)
CO2: 24 mmol/L (ref 22–32)
Calcium: 9.1 mg/dL (ref 8.9–10.3)
Chloride: 103 mmol/L (ref 98–111)
Creatinine, Ser: 1.07 mg/dL (ref 0.61–1.24)
GFR, Estimated: >60 mL/min (ref >60)
Glucose, Bld: 274 mg/dL — ABNORMAL HIGH (ref 70–99)
Potassium: 4.3 mmol/L (ref 3.5–5.1)
Sodium: 136 mmol/L (ref 135–145)

## 2023-06-24 LAB — GLUCOSE, CAPILLARY
Glucose-Capillary: 287 mg/dL — ABNORMAL HIGH (ref 70–99)
Glucose-Capillary: 179 mg/dL — ABNORMAL HIGH (ref 70–99)
Glucose-Capillary: 196 mg/dL — ABNORMAL HIGH (ref 70–99)
Glucose-Capillary: 205 mg/dL — ABNORMAL HIGH (ref 70–99)

## 2023-06-24 MED ORDER — INSULIN GLARGINE-YFGN 100 UNIT/ML ~~LOC~~ SOLN
35.0000 [IU] | Freq: Two times a day (BID) | SUBCUTANEOUS | Status: DC
  Administered 2023-06-24 – 2023-06-26 (×5): 35 [IU] via SUBCUTANEOUS
  Filled 2023-06-24 (×6): qty 0.35

## 2023-06-24 MED ORDER — SALINE SPRAY 0.65 % NA SOLN
1.0000 | NASAL | Status: DC | PRN
Start: 2023-06-24 — End: 2023-06-29
  Filled 2023-06-24: qty 44

## 2023-06-24 NOTE — Progress Notes (Addendum)
Patient ID: Luis [name], male   DOB: 09-05-1970, 52 y.o.   MRN: [id number]  SW sent HHPT/OT/SLP/aide order to [facility] SW.   *pt would like assistance with medication management. SW will send [facility] order to [facility] and [facility].   Cecile Sheerer, MSW, LCSW Office: [phone] Cell: [phone] Fax: [phone]

## 2023-06-24 NOTE — Progress Notes (Signed)
Speech Language Pathology Weekly Progress and Session Note  Patient Details  Name: Luis [name] MRN: [id number] Date of Birth: 1970-11-25  Beginning of progress report period: June 17, 2023 End of progress report period: June 24, 2023  Today's Date: 06/24/2023 SLP Individual Time: 4098-1191 SLP Individual Time Calculation (min): 45 min  Short Term Goals: Week 1: SLP Short Term Goal 1 (Week 1): Stg=ltg due to elos    New Short Term Goals: Week 2: SLP Short Term Goal 1 (Week 2): STG = LTG due to ELOS  Weekly Progress Updates: Pt has made excellent gains and has met 2 of 3 LTGs this reporting period due to improved cognition. No STG written due to initial length of stay, though LOS extended to 12/30. Currently, patient continues to require supervision A for mildly complex problem solving and memory. Patient able to independently recall swallowing compensatory strategies, though plan to observe consumption of regular solids before d/c. Pt/family eduction ongoing. Pt would benefit from continued ST intervention to maximize dysphagia and cognition in order to maximize functional independence at d/c.    Intensity: Minumum of 1-2 x/day, 30 to 90 minutes Frequency: 3 to 5 out of 7 days Duration/Length of Stay: 12/30 Treatment/Interventions: Cognitive remediation/compensation;Cueing hierarchy;Dysphagia/aspiration precaution training;Functional tasks;Internal/external aids;Patient/family education;Therapeutic Activities;Therapeutic Exercise   Daily Session  Skilled Therapeutic Interventions:   Skilled therapy session focused on cognitive goals. 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 Patient shared portions of sermon completed, and steps to finish remainder. SLP provided supervision A during patients use of bible and phone to support sermon theme of "the armor of God." At the end of the sesison, patient  independently recalled swallowing compensatory strategies as well as activities completed past several days. Patient left in bed with alarm set and call bell in reach. Continue POC.     Pain None reported   Therapy/Group: Individual Therapy  Leasha Goldberger M.A., CF-SLP 06/24/2023, 8:48 AM

## 2023-06-24 NOTE — Progress Notes (Signed)
PROGRESS NOTE   Subjective/Complaints:  Discussed importance of f/u , pt feels like his feet are swelling asking about "water pill"  Review of systems  Negative chest pain, shortness of breath, nausea vomiting diarrhea  Objective:   No results found. No results for input(s): "WBC", "HGB", "HCT", "PLT" in the last 72 hours.   Recent Labs    06/22/23 0846 06/23/23 0723  NA 137 133*  K 4.3 4.0  CL 105 100  CO2 22 22  GLUCOSE 249* 264*  BUN 17 16  CREATININE 1.02 0.97  CALCIUM 9.0 9.0    Intake/Output Summary (Last 24 hours) at 06/24/2023 0751 Last data filed at 06/24/2023 0658 Gross per 24 hour  Intake 960 ml  Output 2600 ml  Net -1640 ml        Physical Exam: Vital Signs Blood pressure (!) 133/58, pulse 79, temperature 98 F (36.7 C), temperature source Oral, resp. rate 17, height 5\' 6"  (1.676 m), weight (!) 145.2 kg, SpO2 93%.   General: No acute distress.  Sitting up in bedside chair.   Psych: Mood and affect are appropriate Heart: Regular rate and rhythm no rubs murmurs or extra sounds Lungs: Clear to auscultation, breathing unlabored, no rales or wheezes.  On 2 L nasal cannula. Abdomen: Positive bowel sounds, soft nontender to palpation, protuberant. Extremities: 2+ edema bilateral lower extremities. Skin: No evidence of breakdown, no evidence of rash Neurologic: Awake, alert, and oriented x 3.   Cranial nerves II through XII intact,  motor strength 4/5 right and 5/5 left upper and lower extremities  Musculoskeletal: Full range of motion in all 4 extremities. No joint swelling   Assessment/Plan: 1. Functional deficits which require 3+ hours per day of interdisciplinary therapy in a comprehensive inpatient rehab setting. Physiatrist is providing close team supervision and 24 hour management of active medical problems listed below. Physiatrist and rehab team continue to assess barriers to  discharge/monitor patient progress toward functional and medical goals  Care Tool:  Bathing    Body parts bathed by patient: Right arm, Left arm, Chest, Abdomen, Front perineal area, Buttocks, Right upper leg, Left upper leg, Right lower leg, Left lower leg, Face         Bathing assist Assist Level: Supervision/Verbal cueing     Upper Body Dressing/Undressing Upper body dressing   What is the patient wearing?: Pull over shirt    Upper body assist Assist Level: Supervision/Verbal cueing    Lower Body Dressing/Undressing Lower body dressing      What is the patient wearing?: Pants, Underwear/pull up     Lower body assist Assist for lower body dressing: Supervision/Verbal cueing     Toileting Toileting    Toileting assist Assist for toileting: Supervision/Verbal cueing     Transfers Chair/bed transfer  Transfers assist     Chair/bed transfer assist level: Supervision/Verbal cueing     Locomotion Ambulation   Ambulation assist      Assist level: Contact Guard/Touching assist Assistive device: Walker-rolling Max distance: 150'   Walk 10 feet activity   Assist     Assist level: Supervision/Verbal cueing Assistive device: Walker-rolling   Walk 50 feet activity   Assist  Assist level: Contact Guard/Touching assist Assistive device: Walker-rolling    Walk 150 feet activity   Assist Walk 150 feet activity did not occur: Safety/medical concerns  Assist level: Contact Guard/Touching assist Assistive device: Walker-rolling    Walk 10 feet on uneven surface  activity   Assist     Assist level: Minimal Assistance - Patient > 75% Assistive device: Walker-rolling   Wheelchair     Assist Is the patient using a wheelchair?: Yes Type of Wheelchair: Manual    Wheelchair assist level: Dependent - Patient 0%      Wheelchair 50 feet with 2 turns activity    Assist        Assist Level: Dependent - Patient 0%   Wheelchair  150 feet activity     Assist      Assist Level: Dependent - Patient 0%   Blood pressure (!) 133/58, pulse 79, temperature 98 F (36.7 C), temperature source Oral, resp. rate 17, height 5\' 6"  (1.676 m), weight (!) 145.2 kg, SpO2 93%.  Medical Problem List and Plan: 1. 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  Mild  inconsistent deficits             -patient may shower             -ELOS/Goals: 7 days             Plan D/C 12/30  2.  Antithrombotics: -DVT/anticoagulation:  Pharmaceutical: Lovenox             -antiplatelet therapy: Plavix 75 mg daily.  Patient is allergic to aspirin 3. Pain Management: Hydrocodone 1 tablet every 4 hours as needed 4. Mood/Behavior/Sleep: Cymbalta 30 mg daily, trazodone 200 mg nightly, Atarax as needed             -antipsychotic agents: N/A  -12/23: Waking up in the middle of the night, adding melatonin 5 mg as needed  5. Neuropsych/cognition: This patient is capable of making decisions on his own behalf. 6. Skin/Wound Care: Routine skin checks 7. Fluids/Electrolytes/Nutrition: Routine in and outs with follow-up chemistries  12-22: Labs appear stable  8.  Acute/chronic respiratory failure.  Monitor oxygen saturations  -Respiratory status has been stable 9.  OSA.  CPAP 10.  Morbid obesity.  BMI 50.75.  Dietary follow-up 11.  Poorly controlled diabetes mellitus.  Hemoglobin A1c 12. Increase  Semglee 50 units daily, NovoLog 15 units 3 times daily.  Diabetic teaching   - Farxiga 10 mg daily per cardiology for GDMT  -Uncontrolled increasing Semglee 12/21 to 55U, increase to 60U 12/22-received new dose last night, hold on adjustments today    CBG (last 3)  Recent Labs    06/23/23 1610 06/23/23 2125 06/24/23 0738  GLUCAP 209* 255* 287*  Am still elevated increase semglee to 35U BID  12.  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 Farxiga 10 mg daily, Lanoxin 0.125 mg daily, Bumex 2 mg twice daily, Aldactone 25 mg daily.  Cardiology consulted to adjust GDMT and ensure f/u , not sure if pt may need Heart failure team to manageMonitor for any signs of fluid overload. Daily weights ordered Filed Weights   06/16/23 1750 06/20/23 0500  Weight: (!) 141.7 kg (!) 145.2 kg   12-23: Weight uptrending-cardiology thinks this is inaccurate given -9L urine output since admission.  held Bumex 1 time over the weekend due to hypotension/dizziness, added back daily  (  was prior to twice daily)  Resumed spironolactone 12.5 mg daily yesterday  Increase Bumex back to 2 mg twice daily  Switch Toprol to home carvedilol 3.125 mg twice daily  Restart home Entresto 97-23 mg twice daily  Continue digoxin  13.  Hypertension. Continue Toprol-XL 150 mg daily, Minipress 2 mg nightly.  Monitor with increased mobility    06/24/2023    6:55 AM 06/23/2023    8:01 PM 06/23/2023    2:05 PM  Vitals with BMI  Systolic 133 115 098  Diastolic 58 56 63  Pulse 79 84 88  12/26 control is good   14.  Hyperlipidemia.  Continue Crestor 15.  GERD.  Continue Protonix 16.  History of tobacco as well as polysubstance abuse.  Counseling  17.  Mild Hyper K+ likely due to aldactone resolved     Latest Ref Rng & Units 06/23/2023    7:23 AM 06/22/2023    8:46 AM 06/21/2023    7:50 AM  BMP  Glucose 70 - 99 mg/dL 119  147    BUN 6 - 20 mg/dL 16  17    Creatinine 8.29 - 1.24 mg/dL 5.62  1.30  8.65   Sodium 135 - 145 mmol/L 133  137    Potassium 3.5 - 5.1 mmol/L 4.0  4.3    Chloride 98 - 111 mmol/L 100  105    CO2 22 - 32 mmol/L 22  22    Calcium 8.9 - 10.3 mg/dL 9.0  9.0       LOS: 8 days A FACE TO FACE EVALUATION WAS PERFORMED  Erick Colace 06/24/2023, 7:51 AM

## 2023-06-24 NOTE — Patient Care Conference (Signed)
Inpatient RehabilitationTeam Conference and Plan of Care Update Date: 06/22/2023   Time: 09:11 AM    Patient Name: Luis [name]      Medical Record Number: [id number]  Date of Birth: 29-Jul-1970 Sex: Male         Room/Bed: 4W16C/4W16C-01 Payor Info: Payor: [employer] / Plan: [facility] / Product Type: *No Product type* /    Admit Date/Time:  06/16/2023  5:14 PM  Primary Diagnosis:  Subcortical infarction [facility])  Hospital Problems: Principal Problem:   Subcortical infarction [facility])    Expected Discharge Date: Expected Discharge Date: 06/28/23  Team Members Present: Physician leading conference: Dr. Claudette Laws Social Worker Present: Lavera Guise, BSW Nurse Present: Chana Bode, RN PT Present: Midge Minium, PT OT Present: Mariann Barter, OT SLP Present: Feliberto Gottron, SLP PPS Coordinator present : Fae Pippin, SLP     Current Status/Progress Goal Weekly Team Focus  Bowel/Bladder   pt is continent of b/b   Remain continent   Assist with toileting qshift and prn    Swallow/Nutrition/ Hydration   Regular textures with thin liquids, Mod I-Intermittent supervision   Mod I  tolerance of current diet, use of swallowing strategies    ADL's   Supervision for U/LB BADLs, supervision toileting, superivsion to CGA ambulatory transfers using RW; no DME needs   Mod I   balance trianing, BADL retraining, functional mobility training, R hemi-body strengthening: Barriers-- decreased R foot height, decreased step length, activity tolerance, decreased family/friend support    Mobility   sup for bed mobility and transfers with periodic CGA for sit to stand with mobility after rest;  light CGA to sup + RW for amb of 272ft; R circumduction and limited endurance for SLS on RLE impairing safe amb at mod I level needed for d/c; dynamic balance at CGA level also impairing safe d/c at modI level.   modI with bed mobility, transfers, and amb of  174ft with sup for car transfer  RLE NMR, amb endurance/quality/safety to promote safe locomotion for home access; proressing to modI from sup/CGA level with sit to stand and locomotion, dynamic balance progression to modI level    Communication                Safety/Cognition/ Behavioral Observations  Min-Mod A   Supervision   mildly complex problem solving and recall with use of strategies    Pain   No c/o   Remain pain free   Assess qshift and prn    Skin   Skin intact   Remain intact  Assess qshift and prn      Discharge Planning:  Discharging home with caregiver 24/7.   Team Discussion: Patient post subcortical infarct (not well visualized) vs conversion disorder. Hx. Of falls and weakness appears effort related; doesn't pick up right foot.    Patient on target to meet rehab goals: yes, currently supervision for ADLs. Needs min assist for cognition, memory and problem solving. Goals for discharge set for mod I overall.  *See Care Plan and progress notes for long and short-term goals.   Revisions to Treatment Plan:  N/a   Teaching Needs: Safety, medications, transfers, toileting, etc.   Current Barriers to Discharge: Decreased caregiver support  Possible Resolutions to Barriers: HH follow up services     Medical Summary Current Status: frequent falls PTA, CHF hx with poorfollow up, Morbidly obese  Barriers to Discharge: Morbid Obesity;Cardiac Complications;Self-care education   Possible Resolutions to [employer] Focus: Pt needs to  [facility]   Continued Need for Acute Rehabilitation Level of Care: The patient requires daily medical management by a physician with specialized training in physical medicine and rehabilitation for the following reasons: Direction of a multidisciplinary physical rehabilitation program to maximize functional independence : Yes Medical management of patient stability for increased activity during  participation in an intensive rehabilitation regime.: Yes Analysis of laboratory values and/or radiology reports with any subsequent need for medication adjustment and/or medical intervention. : Yes   I attest that I was present, lead the team conference, and concur with the assessment and plan of the team.   Chana Bode B 06/24/2023, 8:35 AM

## 2023-06-24 NOTE — Progress Notes (Signed)
Occupational Therapy Weekly Progress Note  Patient Details  Name: MARKIES OSBEY MRN: [id number] Date of Birth: December 21, 1970  Beginning of progress report period: June 17, 2023 End of progress report period: June 24, 2023  Today's Date: 06/24/2023 OT Individual Time: 1330-1436 OT Individual Time Calculation (min): 66 min    Patient is progressing toward short term goals d/t them being equal to LTG d/t ELOS.  Pt set to D/C 12/20 with family ed completed on 12/26. Pt with significant increase in independence with ADLs with intermittent assistance with ex-wife and neighbor.    Patient continues to demonstrate the following deficits: muscle weakness, decreased cardiorespiratoy endurance, unbalanced muscle activation and decreased coordination, and decreased standing balance, hemiplegia, and decreased balance strategies and therefore will continue to benefit from skilled OT intervention to enhance overall performance with BADL, iADL, and Reduce care partner burden.  Patient progressing toward long term goals..  Continue plan of care.  OT Short Term Goals Week 1:  OT Short Term Goal 1 (Week 1): STG=LTG d/t ELOS OT Short Term Goal 1 - Progress (Week 1): Progressing toward goal Week 2:  OT Short Term Goal 1 (Week 2): STG=LTG d/t ELOS   Skilled Therapeutic Interventions/Progress Updates:      Therapy Documentation Precautions:  Precautions Precautions: Fall, Other (comment) Precaution Comments: 2L of O2 via nasal cannula baseline (pt reports using it PRN), mild R hemi (LE>UE) Restrictions Weight Bearing Restrictions Per Provider Order: No General: "Thank you, you were good." Pt supine in bed upon OT arrival, agreeable to OT session. Ex-wife present for family ed session.  Pain: no pain reported  ADL:  Bed mobility: SBA supine>EOB using log roll method Toilet transfers: SBA with RW ambulating from bed>toilet no LOB/SOB Toileting: Pt standing to urinate SBA with RW over  toilet, no LOB  IADLs: Pt ambulated from toilet in room to laundry room to out clothes from washer>drier. Pt standing CGA to complete task with no LOB. Pt then ambulated back to room SBA with RW, pt reported increased fatigue after task, requiring rest break.  Other Treatments: Pt and OT discussed D/C planning and HH care required at D/C. Pt reporting would like [facility] [address] nurse for med management since increased amount of pills taken per day and IADl needs. Pt and OT also discussed potential need for bariatric BSC for going to bathroom at night and hospital bed d/t inability to lay flat and edema in legs. OT informed SW and PT about topics discussed. Pt ex wife reported she can potentially work from his apartment in order to provide supervision d/t pt fear of falling. Pt informed of toileting aides available for increased independence d/t difficulty with completing peri care. Pt educated on grocery delivery options available for increased independence with IADls. Pt made phone call to home health agency in order to update them on current needs. Pt tearful d/t word finding difficulties/stuttering when speaking. OT consoled pt and educated on slowing down speech for decreased frustration with task, pt and ex-wife receptive to all information discussed.   Pt seated EOB at end of session, call light within reach and 4Ps assessed. Direct handoff to Andrews PT for session.   Therapy/Group: Individual Therapy  Velia Meyer, OTD, OTR/L 06/24/2023, 4:38 PM

## 2023-06-24 NOTE — Progress Notes (Signed)
Physical Therapy Session Note  Patient Details  Name: Luis [name] MRN: [id number] Date of Birth: 04-Apr-1971  Today's Date: 06/24/2023 PT Individual Time: 1018-1101 and 1437-1530 PT Individual Time Calculation (min): 43 min and 53 min   Short Term Goals: Week 1:  PT Short Term Goal 1 (Week 1): = to LTGs based on ELOS  Skilled Therapeutic Interventions/Progress Updates:    Session 1: Pt received asleep, supine in bed and upon awakening is agreeable to therapy session. Pt not wearing O2 via nasal cannula upon therapist arrival and requests to go without it during session - SpO2 monitored during session to be 96% to start and then 95% after dynamic gait activities during session. Supine>sitting R EOB, HOB slightly elevated and using bedrail, mod-I. Pt reports need to use bathroom. Sit<>stands using bari-RW with supervision. Gait ~19ft x2 in/out bathroom using bari-RW with supervision - continues to have impaired R LE motor planning causing it to stay in extension and lack full foot clearance but slight improvement with cuing. Standing performed 3/3 toileting tasks for continent of bladder mod-I. Sitting/standing from EOB doffed dirty shirt and shorts and donned clean shirt and pants with only set-up using bari-RW for support. Requires max A to don socks and shoes.   Gait training ~25ft to laundry room carrying laundry bag in L hand on RW with close supervision for safety - continues to have improved R foot clearance with hip/knee flexion when walking in more open space and when distracted.   Standing for ~28minutes while placing laundry in machine and turning it on with distant supervision for safety but no balance instability using intermittent UE support on RW vs the machine.   Dynamic gait training using agility ladder including:  - forward step-to pattern using B UE support on RW leading with L LE transitioned to reciprocal pattern - down/back x2 - progressed to same thing as above but  no AD and using only R HHA with light min A for balance - down/back x2    Gait training ~154ft back to room using R HHA with focus on carrying over reciprocal stepping pattern with improved R hip/knee flexion during swing as was targeted during gait through agility ladder - cuing to avoid excessively abducting R LE while maintaining hip/knee flexion activation.   Pt called to ensure family/friend coming for education this afternoon.  Pt left seated in recliner with needs in reach and chair alarm on.  Session 2: Pt received sitting EOB with OT as hand-off from prior session. Pt agreeable to continue with this session, but reports his ex-girlfriend/friend Luis [name]) had to leave to go back to work and was unable to stay for this family education session. Pt reports Luis [name] does work from home and will be able to provide some support when needed. Pt reports feeling frustrated because his current girlfriend, Luis [name], is not answering the phone and is not being supportive and he does not feel he can rely on her at D/C. Therapist provided emotional support and encouragement with plan to support pt in reaching mod-I household level to allow him to safely care for himself. Pt reports he has a fear of falling, but in the next sentence is talking about getting a new large breed puppy and how he plans to walk both his current dog and the new puppy using his rollator despite earlier today reporting not feeling comfortable walking with rollator; however, reports he doesn't have to use a leash for his older dog. Therapist discussed this with him  and states he will be able to figure it out when he goes home. Pt also reports maybe his FOF was just paranoia and does state he has a life alert button at home.  Pt received and maintained on room air throughout session with SpO2 spot checked as reported.  Therapist reinforced education on plan for follow-up HHPT and pt in agreement.  Sit<>stands using bari-RW with  SBA/supervision for safety during session.  Gait training ~59ft x2 to/from laundry room using bari-RW with supervision for safety - continues to demo poor R LE hip/knee flexion during swing with slight circumduction compensation, therapist providing tactile cuing to avoid circumduction with verbal cuing to improve hip/knee flexion.   Pt collected laundry from dryer with supervision using UE support on RW or dryer - did require therapist to retrieve small pieces from back without use of reacher, educated pt on recommendation for use of reacher at home.  After dropping off clothes in his room, gait training ~181ft to main therapy gym using bari-RW with continued supervision and cuing as described above.  SpO2 94% after walking to gym   Donned 4lb ankle weight on R LE - gait training ~49ft x2 to/from stairs using RW - pt demos improved R hip/knee flexion during swing and improved foot placement at initial contact with pt reporting the weight helps him know where his foot is located.  Stair navigation training ascending/descending 8 steps (6" height) using B HRs wearing 4lb ankle weight on R LE with CGA assist for safety - cuing for reciprocal stepping on ascent targeting R hip/knee flexion strengthening for improved swing - performed step-to pattern on descent with varying the lead LE and good R knee control.  Pt reports this is very strenuous. SpO2 95% and HR 110bpm after stairs, improved with seated rest break.  Gait training ~40ft using R HHA with min A for steadying - starts with incoordination in R LE, but once out in hallway and with distractions, is able to progress to normal gait mechanics with reciprocal stepping pattern and increased gait speed.  Gait training remaining ~27ft back to his room using RW as described above.   Standing at EOB folded laundry without assist and with distant supervision for standing balance, no instability noted.   At end of session, pt left seated in recliner  with needs in reach and chair alarm on.    Therapy Documentation Precautions:  Precautions Precautions: Fall, Other (comment) Precaution Comments: 2L of O2 via nasal cannula baseline (pt reports using it PRN), mild R hemi (LE>UE) Restrictions Weight Bearing Restrictions Per Provider Order: No   Pain:  Session 1: No reports of pain throughout session.  Session 2: No reports of pain throughout session.    Therapy/Group: Individual Therapy  Ginny Forth , PT, DPT, NCS, CSRS 06/24/2023, 8:00 AM

## 2023-06-25 DIAGNOSIS — I639 Cerebral infarction, unspecified: Secondary | ICD-10-CM | POA: Diagnosis not present

## 2023-06-25 DIAGNOSIS — J9610 Chronic respiratory failure, unspecified whether with hypoxia or hypercapnia: Secondary | ICD-10-CM | POA: Diagnosis not present

## 2023-06-25 DIAGNOSIS — I5042 Chronic combined systolic (congestive) and diastolic (congestive) heart failure: Secondary | ICD-10-CM | POA: Diagnosis not present

## 2023-06-25 LAB — GLUCOSE, CAPILLARY
Glucose-Capillary: 197 mg/dL — ABNORMAL HIGH (ref 70–99)
Glucose-Capillary: 183 mg/dL — ABNORMAL HIGH (ref 70–99)
Glucose-Capillary: 185 mg/dL — ABNORMAL HIGH (ref 70–99)
Glucose-Capillary: 251 mg/dL — ABNORMAL HIGH (ref 70–99)

## 2023-06-25 NOTE — Progress Notes (Signed)
Speech Language Pathology Daily Session Note  Patient Details  Name: Luis [name] MRN: [id number] Date of Birth: Mar 04, 1971  Today's Date: 06/25/2023 SLP Individual Time: 0800-0859 SLP Individual Time Calculation (min): 59 min  Short Term Goals: Week 2: SLP Short Term Goal 1 (Week 2): STG = LTG due to ELOS  Skilled Therapeutic Interventions: Skilled therapy session focused on cognitive goals. Upon entrance, SLP spoke with patient regarding plan for sermon. Patient reports beginning with prayer, then song and ending with sermon. He reports prefering to utilize notes for sermon rather than attempting to memorize. SLP continued to address cognitive goals including memory and problem solving by asking patient questions regarding stories of the bible and encouraging him to utilize bible and phone to answer. Patient required minA to complete task stating his "mind is blank." Patient transferred to bathroom via RW and continent of bladder. Patient left in chair with alarm set and call bell in reach. Continue POC.   Pain None reported   Therapy/Group: Individual Therapy  Vennessa Affinito M.A., CF-SLP 06/25/2023, 7:38 AM

## 2023-06-25 NOTE — Progress Notes (Signed)
PROGRESS NOTE   Subjective/Complaints:  No issues overnite , no breathing problems  [facility] is [facility] health services including an aide  Review of systems  Negative chest pain, shortness of breath, nausea vomiting diarrhea  Objective:   No results found. No results for input(s): "WBC", "HGB", "HCT", "PLT" in the last 72 hours.   Recent Labs    06/23/23 0723 06/24/23 0936  NA 133* 136  K 4.0 4.3  CL 100 103  CO2 22 24  GLUCOSE 264* 274*  BUN 16 15  CREATININE 0.97 1.07  CALCIUM 9.0 9.1    Intake/Output Summary (Last 24 hours) at 06/25/2023 0756 Last data filed at 06/25/2023 0753 Gross per 24 hour  Intake 834 ml  Output 650 ml  Net 184 ml        Physical Exam: Vital Signs Blood pressure 122/65, pulse 79, temperature 98 F (36.7 C), temperature source Oral, resp. rate 17, height 5\' 6"  (1.676 m), weight (!) 145.2 kg, SpO2 97%.   General: No acute distress.  Sitting up in bedside chair.   Psych: Mood and affect are appropriate Heart: Regular rate and rhythm no rubs murmurs or extra sounds Lungs: Clear to auscultation, breathing unlabored, no rales or wheezes.  On 2 L nasal cannula. Abdomen: Positive bowel sounds, soft nontender to palpation, protuberant. Extremities: 2+ edema bilateral lower extremities. Skin: No evidence of breakdown, no evidence of rash Neurologic: Awake, alert, and oriented x 3.   Cranial nerves II through XII intact,  motor strength 4/5 right and 5/5 left upper and lower extremities  Musculoskeletal: Full range of motion in all 4 extremities. No joint swelling   Assessment/Plan: 1. Functional deficits which require 3+ hours per day of interdisciplinary therapy in a comprehensive inpatient rehab setting. Physiatrist is providing close team supervision and 24 hour management of active medical problems listed below. Physiatrist and rehab team continue to assess barriers to  discharge/monitor patient progress toward functional and medical goals  Care Tool:  Bathing    Body parts bathed by patient: Right arm, Left arm, Chest, Abdomen, Front perineal area, Buttocks, Right upper leg, Left upper leg, Right lower leg, Left lower leg, Face         Bathing assist Assist Level: Supervision/Verbal cueing     Upper Body Dressing/Undressing Upper body dressing   What is the patient wearing?: Pull over shirt    Upper body assist Assist Level: Supervision/Verbal cueing    Lower Body Dressing/Undressing Lower body dressing      What is the patient wearing?: Pants, Underwear/pull up     Lower body assist Assist for lower body dressing: Supervision/Verbal cueing     Toileting Toileting    Toileting assist Assist for toileting: Supervision/Verbal cueing     Transfers Chair/bed transfer  Transfers assist     Chair/bed transfer assist level: Supervision/Verbal cueing Chair/bed transfer assistive device: Geologist, engineering   Ambulation assist      Assist level: Minimal Assistance - Patient > 75% Assistive device: Hand held assist Max distance: 161ft   Walk 10 feet activity   Assist     Assist level: Contact Guard/Touching assist Assistive device: Hand  held assist   Walk 50 feet activity   Assist    Assist level: Minimal Assistance - Patient > 75% Assistive device: Hand held assist    Walk 150 feet activity   Assist Walk 150 feet activity did not occur: Safety/medical concerns  Assist level: Contact Guard/Touching assist Assistive device: Walker-rolling    Walk 10 feet on uneven surface  activity   Assist     Assist level: Minimal Assistance - Patient > 75% Assistive device: Walker-rolling   Wheelchair     Assist Is the patient using a wheelchair?: Yes Type of Wheelchair: Manual    Wheelchair assist level: Dependent - Patient 0%      Wheelchair 50 feet with 2 turns  activity    Assist        Assist Level: Dependent - Patient 0%   Wheelchair 150 feet activity     Assist      Assist Level: Dependent - Patient 0%   Blood pressure 122/65, pulse 79, temperature 98 F (36.7 C), temperature source Oral, resp. rate 17, height 5\' 6"  (1.676 m), weight (!) 145.2 kg, SpO2 97%.  Medical Problem List and Plan: 1. 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  Mild  inconsistent deficits             -patient may shower             -ELOS/Goals:             Plan D/C 12/30, HHPT,  OT, HHA , do not anticipate need for SLP   2.  Antithrombotics: -DVT/anticoagulation:  Pharmaceutical: Lovenox             -antiplatelet therapy: Plavix 75 mg daily.  Patient is allergic to aspirin 3. Pain Management: Hydrocodone 1 tablet every 4 hours as needed 4. Mood/Behavior/Sleep: Cymbalta 30 mg daily, trazodone 200 mg nightly, Atarax as needed             -antipsychotic agents: N/A  -12/23: Waking up in the middle of the night, adding melatonin 5 mg as needed  5. Neuropsych/cognition: This patient is capable of making decisions on his own behalf. 6. Skin/Wound Care: Routine skin checks 7. Fluids/Electrolytes/Nutrition: Routine in and outs with follow-up chemistries  12-22: Labs appear stable  8.  Acute/chronic respiratory failure.  Monitor oxygen saturations  -Respiratory status has been stable 9.  OSA.  CPAP 10.  Morbid obesity.  BMI 50.75.  Dietary follow-up 11.  Poorly controlled diabetes mellitus.  Hemoglobin A1c 12. Increase  Semglee 50 units daily, NovoLog 15 units 3 times daily.  Diabetic teaching   - Farxiga 10 mg daily per cardiology for GDMT  -Uncontrolled increasing Semglee 12/21 to 55U, increase to 60U 12/22-received new dose last night, hold on adjustments today    CBG (last 3)  Recent Labs    06/24/23 1630 06/24/23 2029 06/25/23 0640   GLUCAP 196* 205* 197*  Am still elevated increase semglee to 35U BID, cont to monitor on current dose   12.  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 Farxiga 10 mg daily, Lanoxin 0.125 mg daily, Bumex 2 mg twice daily, Aldactone 25 mg daily.  Cardiology consulted to adjust GDMT and ensure f/u , not sure if pt may need Heart failure team to manageMonitor for any signs of fluid overload. Daily weights ordered Filed Weights   06/16/23 1750 06/20/23 0500  Weight: Marland Kitchen)  141.7 kg (!) 145.2 kg   No sign of edema on exam 12/27  Resumed spironolactone 12.5 mg daily yesterday  Increase Bumex back to 2 mg twice daily  Switch Toprol to home carvedilol 3.125 mg twice daily  Restart home Entresto 97-23 mg twice daily  Continue digoxin  13.  Hypertension. Continue Toprol-XL 150 mg daily, Minipress 2 mg nightly.  Monitor with increased mobility    06/25/2023    6:43 AM 06/24/2023    8:26 PM 06/24/2023    1:24 PM  Vitals with BMI  Systolic 122 109 578  Diastolic 65 43 79  Pulse 79 77 77  12/27 control is good   14.  Hyperlipidemia.  Continue Crestor 15.  GERD.  Continue Protonix 16.  History of tobacco as well as polysubstance abuse.  Counseling  17.  Mild Hyper K+ likely due to aldactone resolved     Latest Ref Rng & Units 06/24/2023    9:36 AM 06/23/2023    7:23 AM 06/22/2023    8:46 AM  BMP  Glucose 70 - 99 mg/dL 469  629  528   BUN 6 - 20 mg/dL 15  16  17    Creatinine 0.61 - 1.24 mg/dL 4.13  2.44  0.10   Sodium 135 - 145 mmol/L 136  133  137   Potassium 3.5 - 5.1 mmol/L 4.3  4.0  4.3   Chloride 98 - 111 mmol/L 103  100  105   CO2 22 - 32 mmol/L 24  22  22    Calcium 8.9 - 10.3 mg/dL 9.1  9.0  9.0      LOS: 9 days A FACE TO FACE EVALUATION WAS PERFORMED  Erick Colace 06/25/2023, 7:56 AM

## 2023-06-25 NOTE — Plan of Care (Signed)
  Problem: Consults Goal: RH GENERAL PATIENT EDUCATION Description: See Patient Education module for education specifics. Outcome: Progressing Goal: Diabetes Guidelines if Diabetic/Glucose > 140 Description: If diabetic or lab glucose is > 140 mg/dl - Initiate Diabetes/Hyperglycemia Guidelines & Document Interventions  Outcome: Progressing   Problem: RH SKIN INTEGRITY Goal: RH STG MAINTAIN SKIN INTEGRITY WITH ASSISTANCE Description: STG Maintain Skin Integrity With Mod I Assistance. Outcome: Progressing   Problem: RH SAFETY Goal: RH STG ADHERE TO SAFETY PRECAUTIONS W/ASSISTANCE/DEVICE Description: STG Adhere to Safety Precautions With Mod I Assistance/Device. Outcome: Progressing   Problem: RH PAIN MANAGEMENT Goal: RH STG PAIN MANAGED AT OR BELOW PT'S PAIN GOAL Description: < 2 on a 0/10 pain scale. Outcome: Progressing   Problem: RH KNOWLEDGE DEFICIT GENERAL Goal: RH STG INCREASE KNOWLEDGE OF SELF CARE AFTER HOSPITALIZATION Description: Patient will demonstrate knowledge of medication management, diabetes management, safety awareness with educational materials and handouts provided by staff during rehabilitation admission. Outcome: Progressing

## 2023-06-25 NOTE — Progress Notes (Addendum)
Patient ID: Luis [name], male   DOB: 03/17/1971, 52 y.o.   MRN: [id number]  SW sent [facility] order to [facility] and [facility].   *SW met with pt based on his request to meet with SW. SW discussed with pt how he will need to call insurance- Medicaid or [facility] to schedule transportation for upcoming urology appt on 12/31. SW encouraged him to reach out to [place] [facility] to see if they can assist, but pt will likely need to call to schedule. He understands that each entity has a process, and he may have to pay someone to transport him. No other questions.concerns reported.   Cecile Sheerer, MSW, LCSW Office: [phone] Cell: [phone] Fax: [phone]

## 2023-06-25 NOTE — Progress Notes (Signed)
Occupational Therapy Session Note  Patient Details  Name: Luis [name] MRN: [id number] Date of Birth: Jan 01, 1971  Today's Date: 06/25/2023 OT Individual Time: 1305-1400 OT Individual Time Calculation (min): 55 min    Short Term Goals: Week 1:  OT Short Term Goal 1 (Week 1): STG=LTG d/t ELOS OT Short Term Goal 1 - Progress (Week 1): Progressing toward goal  Skilled Therapeutic Interventions/Progress Updates:    1:1 Pt received in the bed. Pt participated in self care retraining at shower level. Pt ambulated around the room with contact guard without the RW with his slides on. Pt able to shower sit to stand with distant supervision to mod I. Pt able to dress with supervision in standing using the grab bar to steady himself while stepping into pants. Pt does require A to rub lotion on bilateral feet and don socks and encouraged shoes today - which OT assisted with donning. Pt then ambulated with RW to the main gym with supervision to distant supervision. Discussed fall recovery and how to scoot bring himself closer to a surface. PT able to perform floor to mat and floor to standing with RW with contact guard!!! He did great. Trial of use of SPC to ambulate back to room with supervision. Discussed with PT and will try again with the [facility] this afternoon.  Pt left resting EOB in discussion with RN.   Therapy Documentation Precautions:  Precautions Precautions: Fall, Other (comment) Precaution Comments: 2L of O2 via nasal cannula baseline (pt reports using it PRN), mild R hemi (LE>UE) Restrictions Weight Bearing Restrictions Per Provider Order: No  Pain:  No c/o pain in session   Therapy/Group: Individual Therapy  Roney Mans [facility] 06/25/2023, 3:34 PM

## 2023-06-25 NOTE — Progress Notes (Signed)
Physical Therapy Session Note  Patient Details  Name: Luis [name] MRN: [id number] Date of Birth: [birth year]  Today's Date: 06/25/2023 PT Individual Time: 1040-1103 and [id number] PT Individual Time Calculation (min): 23 min  and 46 min and Today's Date: 06/25/2023 PT Missed Time: 22 Minutes Missed Time Reason:  Other (Comment: pt on phone call to arrange transportation after D/C)   Short Term Goals: Week 1:  PT Short Term Goal 1 (Week 1): = to LTGs based on ELOS  Skilled Therapeutic Interventions/Progress Updates:    Session 1: Pt received sitting in recliner on the phone with insurance/[facility] arranging a ride to his medical appointments and needing additional time to complete this. Missed 22 minutes of skilled physical therapy. Pt getting upset on the phone and hanging up on the person, but despite being frustrated he was agreeable to participate in therapy session. Sit<>stands using RW with supervision. Gait training ~58ft using bari-RW with SBA for safety and then pt spontaneously moved AD out from in front of him and started gait training without it for ~180ft, similar to what was initiated during therapy session yesterday; however, pt demos increased confidence with gait training without AD today and no longer using HHA. Pt continues to have impaired R LE hip/knee flexion during swing causing poor foot clearance with circumduction compensation. (Pt also wearing his slippers today rather than tennis shoes causing this compensation to increase; however, due to pt being frustrated didn't attempt to redirect to don shoes at beginning of session).  Pt reports wanting to try Nustep, so performed B LE reciprocal movement pattern retraining on Nustep against level 4 resistance for 5 minutes totaling 174steps, averaging <20 steps per minute due to slow speed. Pt reports min R knee pain getting started, but later states it has dissipated.  Gait training ~258ft back to his room, no AD, with CGA  for steadying - continues to have imparied R LE gait mechanics as described above.   Pt left seated in recliner with needs in reach and chair alarm on.     Session 2: Pt received sitting EOB on the phone, but agreeable to therapy session. Pt reports he doesn't like the weighted cane and states he feels the most safe using bari-RW.  Sit<>stands using bari-RW progressing from supervision to mod-I. Sit<>stands no AD progressing to supervision. Pt requesting to start gait training using bari-RW to warm up and get loose, but after ~85ft transitioned to no AD for ~145ft with pt demoing improved R LE hip/knee flexion during swing, no longer needing circumduction compensation - pt demos continuously increasing confidence during gait without AD and increasing gait speed. Participated in gait training, no AD, focusing on increased gait endurance while sustaining improved gait mechanics - navigated ~374ft x2 requiring 5 minutes then 6 minutes with 1x standing rest break. Pt confiding in therapist regarding his personal situation and relationships with his significant other and the negative impact she is having in his life - therapist providing therapeutic listening and emotional support.   At end of session, pt left seated in recliner with needs in reach, chair alarm on, and meal tray set-up.     Therapy Documentation Precautions:  Precautions Precautions: Fall, Other (comment) Precaution Comments: 2L of O2 via nasal cannula baseline (pt reports using it PRN), mild R hemi (LE>UE) Restrictions Weight Bearing Restrictions Per Provider Order: No   Pain:  Session 1: R knee pain initially when starting Nutstep, but resolves. No other complaints of pain during session.  Session 2:  No reports of pain throughout session.    Therapy/Group: Individual Therapy  Ginny Forth , PT, DPT, NCS, CSRS 06/25/2023, 7:57 AM

## 2023-06-26 DIAGNOSIS — H938X2 Other specified disorders of left ear: Secondary | ICD-10-CM | POA: Diagnosis not present

## 2023-06-26 DIAGNOSIS — I639 Cerebral infarction, unspecified: Secondary | ICD-10-CM | POA: Diagnosis not present

## 2023-06-26 DIAGNOSIS — G4733 Obstructive sleep apnea (adult) (pediatric): Secondary | ICD-10-CM | POA: Diagnosis not present

## 2023-06-26 DIAGNOSIS — E1165 Type 2 diabetes mellitus with hyperglycemia: Secondary | ICD-10-CM | POA: Diagnosis not present

## 2023-06-26 LAB — GLUCOSE, CAPILLARY
Glucose-Capillary: 214 mg/dL — ABNORMAL HIGH (ref 70–99)
Glucose-Capillary: 231 mg/dL — ABNORMAL HIGH (ref 70–99)
Glucose-Capillary: 289 mg/dL — ABNORMAL HIGH (ref 70–99)
Glucose-Capillary: 289 mg/dL — ABNORMAL HIGH (ref 70–99)

## 2023-06-26 MED ORDER — PSEUDOEPHEDRINE HCL ER 120 MG PO TB12
120.0000 mg | ORAL_TABLET | Freq: Two times a day (BID) | ORAL | Status: AC
  Administered 2023-06-26 – 2023-06-28 (×6): 120 mg via ORAL
  Filled 2023-06-26 (×6): qty 1

## 2023-06-26 MED ORDER — INSULIN GLARGINE-YFGN 100 UNIT/ML ~~LOC~~ SOLN
38.0000 [IU] | Freq: Two times a day (BID) | SUBCUTANEOUS | Status: DC
  Administered 2023-06-26 – 2023-06-29 (×6): 38 [IU] via SUBCUTANEOUS
  Filled 2023-06-26 (×8): qty 0.38

## 2023-06-26 NOTE — Progress Notes (Signed)
PROGRESS NOTE   Subjective/Complaints:  C/o fullness in left ear, using saline drops to keep nasal passageways moist (from cpap).   ROS: Patient denies fever, rash, sore throat, blurred vision, dizziness, nausea, vomiting, diarrhea, cough, shortness of breath or chest pain, joint or back/neck pain, headache, or mood change.   Objective:   No results found. No results for input(s): "WBC", "HGB", "HCT", "PLT" in the last 72 hours.   Recent Labs    06/24/23 0936  NA 136  K 4.3  CL 103  CO2 24  GLUCOSE 274*  BUN 15  CREATININE 1.07  CALCIUM 9.1    Intake/Output Summary (Last 24 hours) at 06/26/2023 0948 Last data filed at 06/26/2023 0800 Gross per 24 hour  Intake 960 ml  Output --  Net 960 ml        Physical Exam: Vital Signs Blood pressure 135/65, pulse 79, temperature 97.9 F (36.6 C), resp. rate 17, height 5\' 6"  (1.676 m), weight (!) 145.2 kg, SpO2 95%.   Constitutional: No distress . Vital signs reviewed. obese HEENT: NCAT, EOMI, oral membranes moist, sniffling. No obvious ear wax Neck: supple Cardiovascular: RRR without murmur. No JVD    Respiratory/Chest: CTA Bilaterally without wheezes or rales. Normal effort    GI/Abdomen: BS +, non-tender, non-distended Ext: no clubbing, cyanosis, or edema Psych: pleasant and cooperative . Skin: No evidence of breakdown, no evidence of rash Neurologic: Awake, alert, and oriented x 3.   Cranial nerves II through XII intact,  motor strength 4/5 right and 5/5 left upper and lower extremities  Musculoskeletal: Full range of motion in all 4 extremities. No joint swelling   Assessment/Plan: 1. Functional deficits which require 3+ hours per day of interdisciplinary therapy in a comprehensive inpatient rehab setting. Physiatrist is providing close team supervision and 24 hour management of active medical problems listed below. Physiatrist and rehab team continue to  assess barriers to discharge/monitor patient progress toward functional and medical goals  Care Tool:  Bathing    Body parts bathed by patient: Right arm, Left arm, Chest, Abdomen, Front perineal area, Buttocks, Right upper leg, Left upper leg, Right lower leg, Left lower leg, Face         Bathing assist Assist Level: Supervision/Verbal cueing     Upper Body Dressing/Undressing Upper body dressing   What is the patient wearing?: Pull over shirt    Upper body assist Assist Level: Supervision/Verbal cueing    Lower Body Dressing/Undressing Lower body dressing      What is the patient wearing?: Pants, Underwear/pull up     Lower body assist Assist for lower body dressing: Supervision/Verbal cueing     Toileting Toileting    Toileting assist Assist for toileting: Supervision/Verbal cueing     Transfers Chair/bed transfer  Transfers assist     Chair/bed transfer assist level: Supervision/Verbal cueing Chair/bed transfer assistive device: Geologist, engineering   Ambulation assist      Assist level: Minimal Assistance - Patient > 75% Assistive device: Hand held assist Max distance: 158ft   Walk 10 feet activity   Assist     Assist level: Contact Guard/Touching assist Assistive device: Hand held assist  Walk 50 feet activity   Assist    Assist level: Minimal Assistance - Patient > 75% Assistive device: Hand held assist    Walk 150 feet activity   Assist Walk 150 feet activity did not occur: Safety/medical concerns  Assist level: Contact Guard/Touching assist Assistive device: Walker-rolling    Walk 10 feet on uneven surface  activity   Assist     Assist level: Minimal Assistance - Patient > 75% Assistive device: Walker-rolling   Wheelchair     Assist Is the patient using a wheelchair?: Yes Type of Wheelchair: Manual    Wheelchair assist level: Dependent - Patient 0%      Wheelchair 50 feet with 2 turns  activity    Assist        Assist Level: Dependent - Patient 0%   Wheelchair 150 feet activity     Assist      Assist Level: Dependent - Patient 0%   Blood pressure 135/65, pulse 79, temperature 97.9 F (36.6 C), resp. rate 17, height 5\' 6"  (1.676 m), weight (!) 145.2 kg, SpO2 95%.  Medical Problem List and Plan: 1. 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  Mild  inconsistent deficits             -patient may shower             -ELOS/Goals:             Plan D/C 12/30, HHPT,  OT, HHA , do not anticipate need for SLP   2.  Antithrombotics: -DVT/anticoagulation:  Pharmaceutical: Lovenox             -antiplatelet therapy: Plavix 75 mg daily.  Patient is allergic to aspirin 3. Pain Management: Hydrocodone 1 tablet every 4 hours as needed 4. Mood/Behavior/Sleep: Cymbalta 30 mg daily, trazodone 200 mg nightly, Atarax as needed             -antipsychotic agents: N/A  -12/23: Waking up in the middle of the night, adding melatonin 5 mg as needed   5. Neuropsych/cognition: This patient is capable of making decisions on his own behalf. 6. Skin/Wound Care: Routine skin checks 7. Fluids/Electrolytes/Nutrition: Routine in and outs with follow-up chemistries  12-22: Labs appear stable  8.  Acute/chronic respiratory failure.  Monitor oxygen saturations  -Respiratory status has been stable 9.  OSA.  CPAP 10.  Morbid obesity.  BMI 50.75.  Dietary follow-up 11.  Poorly controlled diabetes mellitus.  Hemoglobin A1c 12. Increase  Semglee 50 units daily, NovoLog 15 units 3 times daily.  Diabetic teaching   - Farxiga 10 mg daily per cardiology for GDMT  -Uncontrolled increasing Semglee 12/21 to 55U, increase to 60U 12/22-received new dose last night, hold on adjustments today    CBG (last 3)  Recent Labs    06/25/23 1630 06/25/23 2128 06/26/23 0608  GLUCAP 185* 251* 214*  Am  still elevated increase semglee to 35U BID, cont to monitor on current dose  12/28 remains uncontrolled. Increase to 38u bid 12.  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 Farxiga 10 mg daily, Lanoxin 0.125 mg daily, Bumex 2 mg twice daily, Aldactone 25 mg daily.  Cardiology consulted to adjust GDMT and ensure f/u , not sure if pt may need Heart failure team to manageMonitor for any signs of fluid overload. Daily weights ordered Filed Weights   06/16/23 1750 06/20/23 0500  Weight: Marland Kitchen)  141.7 kg (!) 145.2 kg   No sign of edema on exam 12/28--needs daily weights  Resumed spironolactone 12.5 mg daily yesterday  Increase Bumex back to 2 mg twice daily  Switch Toprol to home carvedilol 3.125 mg twice daily  Restart home Entresto 97-23 mg twice daily  Continue digoxin  13.  Hypertension. Continue Toprol-XL 150 mg daily, Minipress 2 mg nightly.  Monitor with increased mobility    06/26/2023    6:17 AM 06/25/2023    9:27 PM 06/25/2023    6:43 AM  Vitals with BMI  Systolic 135 144 010  Diastolic 65 68 65  Pulse 79 87 79  12/28 controlled  14.  Hyperlipidemia.  Continue Crestor 15.  GERD.  Continue Protonix 16.  History of tobacco as well as polysubstance abuse.  Counseling  17.  Mild Hyper K+ likely due to aldactone resolved     Latest Ref Rng & Units 06/24/2023    9:36 AM 06/23/2023    7:23 AM 06/22/2023    8:46 AM  BMP  Glucose 70 - 99 mg/dL 272  536  644   BUN 6 - 20 mg/dL 15  16  17    Creatinine 0.61 - 1.24 mg/dL 0.34  7.42  5.95   Sodium 135 - 145 mmol/L 136  133  137   Potassium 3.5 - 5.1 mmol/L 4.3  4.0  4.3   Chloride 98 - 111 mmol/L 103  100  105   CO2 22 - 32 mmol/L 24  22  22    Calcium 8.9 - 10.3 mg/dL 9.1  9.0  9.0    18. Congestion: c/o fullness in left ear  -will add sudafed short term  LOS: 10 days A FACE TO FACE EVALUATION WAS PERFORMED  Ranelle Oyster 06/26/2023, 9:48 AM

## 2023-06-26 NOTE — Progress Notes (Signed)
Speech Language Pathology Discharge Summary  Patient Details  Name: Luis [name] MRN: [id number] Date of Birth: [birth year]  Date of Discharge from SLP service:June 26, 2023  Today's Date: 06/26/2023 SLP Individual Time: 4098-1191 SLP Individual Time Calculation (min): 23 min   Skilled Therapeutic Interventions:   Earlier in AM, patient stopped SLP in hallway and verbalized he was not going to participate in therapy this date. At scheduled session time, SLP entered and patient reported he was very fatigued and did not want to participate. SLP encouraged patient to compete re-evaluation of cognitive skills and dysphagia since today was grad day for ST. Patient initially willing to participate. SLP observed patient consume regular textured solids with adequate oral clearance, timely mastication and no overt s/sx of aspiration. Recommend continuation of current diet. SLP re-evaluated cognitive linguistic skills through re-completion of the Cognistat. This date, patient scored WFL on all subtests indicative of improvements in problem solving and memory since admission. After completion of standardized assessment, patient with refusal to continue session due to fatigue and lack of sleep last night. Patient missed 37 minutes of therapy this date due to fatigue. SLP will attempt to return as patient/therapist schedule allows. Patient left in chair with alarm set and call bell in reach. Continue POC.    Patient has met 3 of 3 long term goals.  Patient to discharge at overall Modified Independent;Supervision level.  Reasons goals not met: n/a   Clinical Impression/Discharge Summary:  Pt has made excellent gains and has met 3 of 3 LTG's this admission due to improved memory, problem solving and dysphagia. Pt is currently an overall supervisionA for cognitive tasks and requires mod i cues for utilization of swallowing compensatory strategies to minimize overt s/sx of aspiration with regular/thin  diet. Pt/family education complete and pt will discharge home with supervision from friends/family/etc. No f/u ST recommended at this time due to significance of patient progress.  Care Partner:  Caregiver Able to Provide Assistance: Yes  Type of Caregiver Assistance: Physical;Cognitive  Recommendation:  None      Equipment: n/a   Reasons for discharge: Treatment goals met;Discharged from hospital   Patient/Family Agrees with Progress Made and Goals Achieved: Yes    [name] M.A., CF-SLP 06/26/2023, 11:27 AM

## 2023-06-26 NOTE — Progress Notes (Signed)
Physical Therapy Session Note  Patient Details  Name: Luis [name] MRN: [id number] Date of Birth: [birth year]  Today's Date: 06/26/2023 PT Individual Time: 4098-1191 PT Individual Time Calculation (min): 38 min   Short Term Goals: Week 1:  PT Short Term Goal 1 (Week 1): = to LTGs based on ELOS  Skilled Therapeutic Interventions/Progress Updates:    Pt received asleep, supine in bed with CPAP machine on. Pt easily awakens and is agreeable to limited therapy due to increased pain in bilateral knees. Therapist provides pt with ice for pain management at beginning of session. Therapist discussed plan for D/C on Monday with final therapy sessions tomorrow. Performed D/C reassessments. Sit<>stands using bari-RW mod-I. Gait training in/out bathroom using bari-RW mod-I. Completed 3/3 toileting tasks mod-I for continent void of bladder. Pt inquiring about his "fluid pills" therapist consulted nurse to follow-up regarding which medications he is currently prescribed. Pt set-up for meal and left seated EOB with needs in reach.    Therapy Documentation Precautions:  Precautions Precautions: Fall, Other (comment) Precaution Comments: 2L of O2 via nasal cannula baseline (pt reports using it PRN), mild R hemi (LE>UE) Restrictions Weight Bearing Restrictions Per Provider Order: No   Pain:  Reports bilateral knee pain, unrated but reports it is significant. Therapist provides ice for pain management at start of session.    Therapy/Group: Individual Therapy  Ginny Forth , PT, DPT, NCS, CSRS 06/26/2023, 3:30 PM

## 2023-06-26 NOTE — Plan of Care (Signed)
  Problem: RH Swallowing Goal: LTG Patient will consume least restrictive diet using compensatory strategies with assistance (SLP) Description: LTG:  Patient will consume least restrictive diet using compensatory strategies with assistance (SLP) Outcome: Completed/Met   Problem: RH Problem Solving Goal: LTG Patient will demonstrate problem solving for (SLP) Description: LTG:  Patient will demonstrate problem solving for basic/complex daily situations with cues  (SLP) Outcome: Completed/Met   Problem: RH Memory Goal: LTG Patient will use memory compensatory aids to (SLP) Description: LTG:  Patient will use memory compensatory aids to recall biographical/new, daily complex information with cues (SLP) Outcome: Completed/Met

## 2023-06-26 NOTE — Plan of Care (Signed)
  Problem: Consults Goal: RH GENERAL PATIENT EDUCATION Description: See Patient Education module for education specifics. Outcome: Progressing Goal: Diabetes Guidelines if Diabetic/Glucose > 140 Description: If diabetic or lab glucose is > 140 mg/dl - Initiate Diabetes/Hyperglycemia Guidelines & Document Interventions  Outcome: Progressing   Problem: RH SKIN INTEGRITY Goal: RH STG MAINTAIN SKIN INTEGRITY WITH ASSISTANCE Description: STG Maintain Skin Integrity With Mod I Assistance. Outcome: Progressing   Problem: RH SAFETY Goal: RH STG ADHERE TO SAFETY PRECAUTIONS W/ASSISTANCE/DEVICE Description: STG Adhere to Safety Precautions With Mod I Assistance/Device. Outcome: Progressing   Problem: RH PAIN MANAGEMENT Goal: RH STG PAIN MANAGED AT OR BELOW PT'S PAIN GOAL Description: < 2 on a 0/10 pain scale. Outcome: Progressing   Problem: RH KNOWLEDGE DEFICIT GENERAL Goal: RH STG INCREASE KNOWLEDGE OF SELF CARE AFTER HOSPITALIZATION Description: Patient will demonstrate knowledge of medication management, diabetes management, safety awareness with educational materials and handouts provided by staff during rehabilitation admission. Outcome: Progressing

## 2023-06-26 NOTE — Progress Notes (Signed)
Physical Therapy Discharge Summary  Patient Details  Name: Luis [name] MRN: [id number] Date of Birth: 06-01-1971  Date of Discharge from PT service:{Time; dates multiple:304500300}  {CHL IP REHAB PT TIME CALCULATION:304800500}   Patient has met {NUMBERS 0-12:18577} of {NUMBERS 0-12:18577} long term goals due to improved activity tolerance, improved balance, improved postural control, increased strength, ability to compensate for deficits, functional use of  right upper extremity and right lower extremity, and improved coordination.  Patient to discharge at an ambulatory level Modified Independent using bari-RW for household level mobility.   Patient's care partner is independent to provide the necessary physical and cognitive assistance at discharge.  Reasons goals not met: ***  Recommendation:  Patient will benefit from ongoing skilled PT services in home health setting to continue to advance safe functional mobility, address ongoing impairments in R hemiparesis, dynamic gait training with LRAD, community reintegration, and minimize fall risk.  Equipment: Bari-RW  Reasons for discharge: treatment goals met and discharge from hospital  Patient/family agrees with progress made and goals achieved: Yes  PT Discharge Precautions/Restrictions Precautions Precautions: Fall;Other (comment) Precaution Comments: 2L of O2 via nasal cannula baseline (pt reports using it PRN), mild R hemi (LE>UE) Restrictions Weight Bearing Restrictions Per Provider Order: No Pain ***   Pain Interference Pain Interference Pain Effect on Sleep: 3. Frequently Pain Interference with Therapy Activities: 1. Rarely or not at all Pain Interference with Day-to-Day Activities: 1. Rarely or not at all Vision/Perception  Vision - Assessment Eye Alignment: Within Functional Limits Tracking/Visual Pursuits: Able to track stimulus in all quads without difficulty Perception Perception: Within Functional  Limits Praxis Praxis: WFL  Cognition Overall Cognitive Status: Impaired/Different from baseline Arousal/Alertness: Awake/alert Orientation Level: Oriented X4 Year: 2024 Month: December Day of Week: Correct Attention: Focused;Sustained Focused Attention: Appears intact Sustained Attention: Appears intact Memory: Impaired Awareness: Appears intact Safety/Judgment: Appears intact Sensation Sensation Light Touch: Impaired Detail Central sensation comments: unable to feel light touch in R LE, only deep pressure Light Touch Impaired Details: Impaired RUE;Absent RLE Hot/Cold: Not tested Proprioception: Impaired Detail (noticed during functional mobility, but improved from initial eval) Proprioception Impaired Details: Impaired RLE Stereognosis: Not tested Coordination Gross Motor Movements are Fluid and Coordinated: No Coordination and Movement Description: slight R hemiparesis (LE>UE) with slowed motor movements, but improved from initial eval Motor  Motor Motor: Hemiplegia;Other (comment) Motor - Discharge Observations: slight R hemiparesis (LE>UE) with slowed motor movements; however, significantly improved from initial eval  Mobility Bed Mobility Bed Mobility: Supine to Sit;Sit to Supine Rolling Right: Independent with assistive device Rolling Left: Independent with assistive device Supine to Sit: Independent with assistive device Sit to Supine: Independent with assistive device Transfers Transfers: Sit to Stand;Stand to Sit;Stand Pivot Transfers Sit to Stand: Independent with assistive device Stand to Sit: Independent with assistive device Stand Pivot Transfers: Independent with assistive device Transfer (Assistive device): Rolling walker Locomotion  Gait Ambulation: Yes Gait Assistance: Supervision/Verbal cueing;Independent with assistive device Gait Distance (Feet): 300 Feet Assistive device: Rolling walker (bari-RW) Gait Gait: Yes Gait Pattern: Impaired Gait  Pattern: Poor foot clearance - right;Step-through pattern;Decreased hip/knee flexion - right (significantly improved) Gait velocity: decreased, but improved Stairs / Additional Locomotion Stairs: Yes Stairs Assistance: Contact Guard/Touching assist Stair Management Technique: Two rails;Step to pattern Number of Stairs: 8 Height of Stairs: 6 Ramp: Supervision/Verbal cueing (using RW) Wheelchair Mobility Wheelchair Mobility: No  Trunk/Postural Assessment  Cervical Assessment Cervical Assessment: Within Functional Limits Thoracic Assessment Thoracic Assessment: Exceptions to [facility] (rounded shoulders) Lumbar Assessment Lumbar Assessment:  Exceptions to [facility] (posterior pelvic tilt) Postural Control Postural Control: Within Functional Limits (using B UE support on RW)  Balance Balance Balance Assessed: Yes Static Sitting Balance Static Sitting - Level of Assistance: 6: Modified independent (Device/Increase time) Dynamic Sitting Balance Dynamic Sitting - Balance Support: Feet supported Dynamic Sitting - Level of Assistance: 6: Modified independent (Device/Increase time) Static Standing Balance Static Standing - Balance Support: During functional activity;Bilateral upper extremity supported Static Standing - Level of Assistance: 6: Modified independent (Device/Increase time) Dynamic Standing Balance Dynamic Standing - Balance Support: During functional activity;Bilateral upper extremity supported Dynamic Standing - Level of Assistance: 5: Stand by assistance;6: Modified independent (Device/Increase time) Extremity Assessment ***check R LE***       LLE Assessment LLE Assessment: Within Functional Limits Active Range of Motion (AROM) Comments: WFL/WNL General Strength Comments: assessed in sitting - 5/5 throughout   Ginny Forth , PT, DPT, NCS, CSRS 06/26/2023, 5:36 PM

## 2023-06-27 ENCOUNTER — Inpatient Hospital Stay (HOSPITAL_COMMUNITY): Payer: No Typology Code available for payment source

## 2023-06-27 DIAGNOSIS — E8770 Fluid overload, unspecified: Secondary | ICD-10-CM | POA: Diagnosis not present

## 2023-06-27 DIAGNOSIS — I5041 Acute combined systolic (congestive) and diastolic (congestive) heart failure: Secondary | ICD-10-CM

## 2023-06-27 DIAGNOSIS — I639 Cerebral infarction, unspecified: Secondary | ICD-10-CM | POA: Diagnosis not present

## 2023-06-27 DIAGNOSIS — I10 Essential (primary) hypertension: Secondary | ICD-10-CM | POA: Diagnosis not present

## 2023-06-27 LAB — GLUCOSE, CAPILLARY
Glucose-Capillary: 179 mg/dL — ABNORMAL HIGH (ref 70–99)
Glucose-Capillary: 142 mg/dL — ABNORMAL HIGH (ref 70–99)
Glucose-Capillary: 268 mg/dL — ABNORMAL HIGH (ref 70–99)
Glucose-Capillary: 316 mg/dL — ABNORMAL HIGH (ref 70–99)

## 2023-06-27 MED ORDER — FUROSEMIDE 10 MG/ML IJ SOLN: 40.0000 mg | Freq: Two times a day (BID) | INTRAMUSCULAR | Status: DC

## 2023-06-27 MED ORDER — BUMETANIDE 2 MG PO TABS
4.0000 mg | ORAL_TABLET | Freq: Every day | ORAL | Status: DC
  Filled 2023-06-27: qty 2

## 2023-06-27 MED ORDER — FUROSEMIDE 10 MG/ML IJ SOLN: 40.0000 mg | Freq: Three times a day (TID) | INTRAMUSCULAR | Status: DC

## 2023-06-27 MED ORDER — FUROSEMIDE 40 MG PO TABS
40.0000 mg | ORAL_TABLET | Freq: Once | ORAL | Status: AC
  Administered 2023-06-27: 40 mg via ORAL
  Filled 2023-06-27: qty 1

## 2023-06-27 MED ORDER — POTASSIUM CHLORIDE CRYS ER 20 MEQ PO TBCR
20.0000 meq | EXTENDED_RELEASE_TABLET | Freq: Every day | ORAL | Status: DC
  Administered 2023-06-27 – 2023-06-29 (×3): 20 meq via ORAL
  Filled 2023-06-27 (×3): qty 1

## 2023-06-27 MED ORDER — FUROSEMIDE 10 MG/ML IJ SOLN
40.0000 mg | Freq: Three times a day (TID) | INTRAMUSCULAR | Status: AC
  Administered 2023-06-27 – 2023-06-28 (×3): 40 mg via INTRAVENOUS
  Filled 2023-06-27 (×3): qty 4

## 2023-06-27 NOTE — Progress Notes (Signed)
Occupational Therapy Discharge Summary  Patient Details  Name: Luis [name] MRN: [id number] Date of Birth: [birth year]  Date of Discharge from OT service:June 27, 2023  Today's Date: 06/27/2023 OT Individual Time: 1117-1201 OT Individual Time Calculation (min): 44 min  Session 2:  OT Individual Time: 1300-1355 OT Individual Time Calculation (min): 55 min   Patient has met 12 of 12 long term goals due to improved activity tolerance, improved balance, postural control, ability to compensate for deficits, functional use of  RIGHT upper and RIGHT lower extremity, improved attention, improved awareness, and improved coordination.  Patient to discharge at overall Modified Independent level.  Patient's care partner is independent to provide the necessary physical and cognitive assistance at discharge.    Reasons goals not met: All goals metn  Recommendation:  Patient will benefit from ongoing skilled OT services in home health setting to continue to advance functional skills in the area of BADL, iADL, and Reduce care partner burden.  Equipment: No equipment provided  Reasons for discharge: treatment goals met and discharge from hospital  Patient/family agrees with progress made and goals achieved: Yes  OT Discharge Skilled Therapeutic Interventions/Progress Updates:  AM Session: Pt received sitting up in recliner presenting to be in good spirits receptive to skilled OT session reporting 9/10 pain d/t headache with Pt premedicated prior to OT session- OT offering intermittent rest breaks, repositioning, and therapeutic support to optimize participation in therapy session. Focus this session HEP education, activity tolerance, and IADL retraining. Pt completed functional mobility during session to/from therapy spaces without AD for increased balance challenge >150 ft x2 trials- CGA provided for safety and balance and min verbal cues for R foot placement and trunk positioning. Sitting  EOM, Engaged Pt in seated B UE exercises using blue heavy resistance therband with handout and demonstration provided for HEP education. Pt completed the following exercises with OT providing verbal and tactile cues for positioning, technique, and muscle engagement:  Exercises - Seated Shoulder Horizontal Abduction with Resistance  - 1 x daily - 7 x weekly - 2 sets - 10 reps - Seated Shoulder Diagonal Pulls with Resistance  - 1 x daily - 7 x weekly - 2 sets - 10 reps - Seated Elbow Flexion with Self-Anchored Resistance  - 1 x daily - 7 x weekly - 2 sets - 10 reps - Seated Elbow Extension with Self-Anchored Resistance  - 1 x daily - 7 x weekly - 2 sets - 10 reps - Seated Shoulder Flexion with Self-Anchored Resistance  - 1 x daily - 7 x weekly - 2 sets - 10 reps - Seated Bilateral Shoulder External Rotation with Resistance  - 1 x daily - 7 x weekly - 2 sets - 10 reps Pt fatigued following with seated rest break provided.  Engaged Pt in IADL laundry task to increase Pt's independence and safety upon d/c. Pt instructed to complete functional mobility th laundry room using RW and retrieve laundry from drier to simulate routine for how Pt will be completing laundry at home. Pt able to navigate through hallway and laundry room using RW with mod I and retrieve 8/8 items from drier without LOB. Seated rest break provided following. Pt requesting to use restroom. Pt completed functional mobility to his room using RW and stood at toilet for continent void completing 3/3 toileting tasks mod I. Pt was left resting in recliner with call bell in reach, chair alarm on, and all needs met.   PM session:  Pt received sitting up  in recliner presenting to be in good spirits receptive to skilled OT session reporting 10/10 pain in bilateral knees, however Pt not presenting to be in pain during session and able to tolerate standing positions and completing BADLs without pain interfering with tasks- OT offering intermittent rest  breaks, repositioning, and therapeutic support to optimize participation in therapy session. Pt completed functional mobility and ambulatory transfers using RW mod I throughout session today. Pt able to retrieve towels from elevated shelves and set-up environment for shower mod I using RW without LOB noted. Pt transferred to standard toilet and completed 3/3 toileting tasks mod I using RW following continent void and BM- documented in flowsheets. Doffed clothing while seated on toilet mod I and transferred to walk-in shower using grab bars mod I no LOB noted. Pt completed U/LB bathing seated on TTB for energy conservation using long-handled sponge to wash feet and lower B LEs and standing briefly to wash peri-areas mod I. Pt required increased time and rest breaks during shower for energy conservation, however Pt able to implement energy conservation techniques independently demonstrating increased safety awareness and increased awareness into his deficits. Following shower, Pt completed U/LB dressing tasks seated EOB using RW for balance when standing to bring pants to waist mod I. Pt able to utilize sock aid to donn socks with min verbal cues required to recall positioning of sock for set-up on sock aid. Pt completed grooming tasks- styling his hair and applying lotion mod I in seated position. Pt reporting all questions answered at end of session and reporting agreement with d/c plan for tomorrow. Pt was left sitting EOB with call bell in reach, bed alarm on, and all needs met. Rn entering room at end of session to provided medications.  Precautions/Restrictions  Precautions Precautions: Fall;Other (comment) Precaution Comments: 2L of O2 via nasal cannula baseline (pt reports using it PRN), mild R hemi (LE>UE) Restrictions Weight Bearing Restrictions Per Provider Order: No Pain Pain Assessment Pain Scale: 0-10 ADL ADL Equipment Provided: Reacher, Sock aid, Long-handled sponge Eating:  Independent Where Assessed-Eating: Chair Grooming: Modified independent Where Assessed-Grooming: Standing at sink, Edge of bed Upper Body Bathing: Modified independent Where Assessed-Upper Body Bathing: Shower Lower Body Bathing: Modified independent Where Assessed-Lower Body Bathing: Shower Upper Body Dressing: Modified independent (Device) Where Assessed-Upper Body Dressing: Edge of bed Lower Body Dressing: Modified independent Where Assessed-Lower Body Dressing: Edge of bed Toileting: Modified independent Where Assessed-Toileting: Teacher, adult education: Engineer, agricultural Method: Proofreader: Engineer, technical sales: Not assessed Film/video editor: Modified independent Film/video editor Method: Designer, industrial/product: Grab bars, Sales promotion account executive Baseline Vision/History: 1 Wears glasses Patient Visual Report: No change from baseline Vision Assessment?: Yes Eye Alignment: Within Functional Limits Ocular Range of Motion: Within Functional Limits Alignment/Gaze Preference: Within Defined Limits Tracking/Visual Pursuits: Able to track stimulus in all quads without difficulty Saccades: Within functional limits Convergence: Within functional limits Visual Fields: No apparent deficits Perception  Perception: Within Functional Limits Praxis Praxis: WFL Cognition Cognition Overall Cognitive Status: Impaired/Different from baseline Arousal/Alertness: Awake/alert Orientation Level: Person;Place;Situation Person: Oriented Place: Oriented Situation: Oriented Memory: Impaired Memory Impairment: Storage deficit;Retrieval deficit;Decreased short term memory;Decreased recall of new information Decreased Short Term Memory: Functional complex Focused Attention: Appears intact Sustained Attention: Appears intact Awareness: Appears intact Problem Solving: Appears intact Safety/Judgment: Appears  intact Brief Interview for Mental Status (BIMS) Repetition of Three Words (First Attempt): 3 Temporal Orientation: Year: Correct Temporal Orientation: Month: Accurate within 5 days Temporal  Orientation: Day: Correct Recall: "Sock": Yes, no cue required Recall: "Blue": Yes, no cue required Recall: "Bed": Yes, no cue required BIMS Summary Score: 15 Sensation Sensation Light Touch: Impaired Detail Central sensation comments: unable to feel light tounch in R UE, only deep pressure Light Touch Impaired Details: Impaired RUE Hot/Cold: Impaired Detail Hot/Cold Impaired Details: Impaired RUE;Impaired RLE Proprioception: Impaired Detail (occasional cues needed for R LE placement, Pt with improved recall and awareness of R LE position since eval) Proprioception Impaired Details: Impaired RLE;Impaired RUE Stereognosis: Not tested Coordination Gross Motor Movements are Fluid and Coordinated: No Fine Motor Movements are Fluid and Coordinated: Yes Coordination and Movement Description: slight R hemiparesis (LE>UE) with slowed motor movements, but improved from initial eval Finger Nose Finger Test: improved coordination of R UE motor movements with no undershooting or dysmetria noted Motor  Motor Motor: Hemiplegia;Other (comment) Motor - Discharge Observations: slight R hemiparesis (LE>UE) with slowed motor movements; however, significantly improved from initial eval Mobility  Bed Mobility Bed Mobility: Supine to Sit;Sit to Supine Rolling Right: Independent with assistive device Rolling Left: Independent with assistive device Supine to Sit: Independent with assistive device Sit to Supine: Independent with assistive device Transfers Sit to Stand: Independent with assistive device Stand to Sit: Independent with assistive device  Trunk/Postural Assessment  Cervical Assessment Cervical Assessment: Within Functional Limits Thoracic Assessment Thoracic Assessment: Exceptions to [facility] (rounded  shoulders) Lumbar Assessment Lumbar Assessment: Exceptions to [facility] (posterior pelvic tilt) Postural Control Postural Control: Within Functional Limits (with B UE support on RW)  Balance Balance Balance Assessed: Yes Static Sitting Balance Static Sitting - Balance Support: Feet unsupported Static Sitting - Level of Assistance: 6: Modified independent (Device/Increase time) Dynamic Sitting Balance Dynamic Sitting - Balance Support: Feet supported Dynamic Sitting - Level of Assistance: 6: Modified independent (Device/Increase time) Static Standing Balance Static Standing - Balance Support: During functional activity;Bilateral upper extremity supported Static Standing - Level of Assistance: 6: Modified independent (Device/Increase time) Dynamic Standing Balance Dynamic Standing - Balance Support: During functional activity;Bilateral upper extremity supported Dynamic Standing - Level of Assistance: 6: Modified independent (Device/Increase time) Extremity/Trunk Assessment RUE Assessment RUE Assessment: Exceptions to [facility] General Strength Comments: 4+/5 overall LUE Assessment LUE Assessment: Within Functional Limits   Tollie Pizza Woodson 06/27/2023, 1:32 PM

## 2023-06-27 NOTE — Plan of Care (Signed)
  Problem: RH Balance Goal: LTG: Patient will maintain dynamic sitting balance (OT) Description: LTG:  Patient will maintain dynamic sitting balance with assistance during activities of daily living (OT) Outcome: Completed/Met Goal: LTG Patient will maintain dynamic standing with ADLs (OT) Description: LTG:  Patient will maintain dynamic standing balance with assist during activities of daily living (OT)  Outcome: Completed/Met   Problem: Sit to Stand Goal: LTG:  Patient will perform sit to stand in prep for activites of daily living with assistance level (OT) Description: LTG:  Patient will perform sit to stand in prep for activites of daily living with assistance level (OT) Outcome: Completed/Met   Problem: RH Grooming Goal: LTG Patient will perform grooming w/assist,cues/equip (OT) Description: LTG: Patient will perform grooming with assist, with/without cues using equipment (OT) Outcome: Completed/Met   Problem: RH Bathing Goal: LTG Patient will bathe all body parts with assist levels (OT) Description: LTG: Patient will bathe all body parts with assist levels (OT) Outcome: Completed/Met   Problem: RH Dressing Goal: LTG Patient will perform upper body dressing (OT) Description: LTG Patient will perform upper body dressing with assist, with/without cues (OT). Outcome: Completed/Met Goal: LTG Patient will perform lower body dressing w/assist (OT) Description: LTG: Patient will perform lower body dressing with assist, with/without cues in positioning using equipment (OT) Outcome: Completed/Met   Problem: RH Toileting Goal: LTG Patient will perform toileting task (3/3 steps) with assistance level (OT) Description: LTG: Patient will perform toileting task (3/3 steps) with assistance level (OT)  Outcome: Completed/Met   Problem: RH Functional Use of Upper Extremity Goal: LTG Patient will use RT/LT upper extremity as a (OT) Description: LTG: Patient will use right/left upper  extremity as a stabilizer/gross assist/diminished/nondominant/dominant level with assist, with/without cues during functional activity (OT) Outcome: Completed/Met   Problem: RH Toilet Transfers Goal: LTG Patient will perform toilet transfers w/assist (OT) Description: LTG: Patient will perform toilet transfers with assist, with/without cues using equipment (OT) Outcome: Completed/Met   Problem: RH Tub/Shower Transfers Goal: LTG Patient will perform tub/shower transfers w/assist (OT) Description: LTG: Patient will perform tub/shower transfers with assist, with/without cues using equipment (OT) Outcome: Completed/Met   Problem: RH Memory Goal: LTG Patient will demonstrate ability for day to day recall/carry over during activities of daily living with assistance level (OT) Description: LTG:  Patient will demonstrate ability for day to day recall/carry over during activities of daily living with assistance level (OT). Outcome: Completed/Met

## 2023-06-27 NOTE — Progress Notes (Signed)
Pt has received a 40mg  po dose of lasix and 40mg  IV lasix so far. He is diuresing and has put out 1500 cc today. Nurse reports that the patient feels his breathing is better.   Will give another dose of IV lasix tonight and again in the AM. Primary team can reassess in the morning. Might be beneficial for cardiology to weigh in on him tomorrow as well.   Ranelle Oyster, MD, [facility] Physical Medicine & Rehabilitation Medical Director Rehabilitation Services 06/27/2023

## 2023-06-27 NOTE — Plan of Care (Signed)
°  Problem: Consults Goal: RH GENERAL PATIENT EDUCATION Description: See Patient Education module for education specifics. Outcome: Progressing   Problem: RH SKIN INTEGRITY Goal: RH STG MAINTAIN SKIN INTEGRITY WITH ASSISTANCE Description: STG Maintain Skin Integrity With Mod I Assistance. Outcome: Progressing   Problem: RH SAFETY Goal: RH STG ADHERE TO SAFETY PRECAUTIONS W/ASSISTANCE/DEVICE Description: STG Adhere to Safety Precautions With Mod I Assistance/Device. Outcome: Progressing   Problem: RH PAIN MANAGEMENT Goal: RH STG PAIN MANAGED AT OR BELOW PT'S PAIN GOAL Description: < 2 on a 0/10 pain scale. Outcome: Progressing

## 2023-06-27 NOTE — Progress Notes (Addendum)
PROGRESS NOTE   Subjective/Complaints:  Pt c/o increased SOB and orthopnea. Head/ear still feel "full" He is usually on bumex at home. Not on it currently  ROS: Patient denies fever, rash, sore throat, blurred vision, dizziness, nausea, vomiting, diarrhea, cough, shortness of breath or chest pain, joint or back/neck pain, headache, or mood change.    Objective:   No results found. No results for input(s): "WBC", "HGB", "HCT", "PLT" in the last 72 hours.   No results for input(s): "NA", "K", "CL", "CO2", "GLUCOSE", "BUN", "CREATININE", "CALCIUM" in the last 72 hours.   Intake/Output Summary (Last 24 hours) at 06/27/2023 1003 Last data filed at 06/27/2023 0700 Gross per 24 hour  Intake 960 ml  Output 1500 ml  Net -540 ml        Physical Exam: Vital Signs Blood pressure (!) 140/79, pulse 77, temperature 97.9 F (36.6 C), temperature source Oral, resp. rate 18, height 5\' 6"  (1.676 m), weight (!) 149.7 kg, SpO2 95%.   Constitutional: No distress . Vital signs reviewed. obese HEENT: NCAT, EOMI, oral membranes moist Neck: supple Cardiovascular: RRR without murmur. No JVD    Respiratory/Chest: CTA Bilaterally without wheezes or rales. Normal effort    GI/Abdomen: BS +, non-tender, non-distended Ext: no clubbing, cyanosis, 2+ pitting edema bilateral LE Psych: pleasant and cooperative  Skin: No evidence of breakdown, no evidence of rash Neurologic: Awake, alert, and oriented x 3.   Cranial nerves II through XII intact,  motor strength 4/5 right and 5/5 left upper and lower extremities. Good sitting balance  Musculoskeletal: Full range of motion in all 4 extremities. No joint swelling   Assessment/Plan: 1. Functional deficits which require 3+ hours per day of interdisciplinary therapy in a comprehensive inpatient rehab setting. Physiatrist is providing close team supervision and 24 hour management of active medical  problems listed below. Physiatrist and rehab team continue to assess barriers to discharge/monitor patient progress toward functional and medical goals  Care Tool:  Bathing    Body parts bathed by patient: Right arm, Left arm, Chest, Abdomen, Front perineal area, Buttocks, Right upper leg, Left upper leg, Right lower leg, Left lower leg, Face         Bathing assist Assist Level: Supervision/Verbal cueing     Upper Body Dressing/Undressing Upper body dressing   What is the patient wearing?: Pull over shirt    Upper body assist Assist Level: Supervision/Verbal cueing    Lower Body Dressing/Undressing Lower body dressing      What is the patient wearing?: Pants, Underwear/pull up     Lower body assist Assist for lower body dressing: Supervision/Verbal cueing     Toileting Toileting    Toileting assist Assist for toileting: Supervision/Verbal cueing     Transfers Chair/bed transfer  Transfers assist     Chair/bed transfer assist level: Independent with assistive device Chair/bed transfer assistive device: Arboriculturist assist      Assist level: Supervision/Verbal cueing Assistive device: Walker-rolling Max distance: 356ft   Walk 10 feet activity   Assist     Assist level: Independent with assistive device Assistive device: Walker-rolling   Walk 50 feet activity  Assist    Assist level: Independent with assistive device Assistive device: Walker-rolling    Walk 150 feet activity   Assist Walk 150 feet activity did not occur: Safety/medical concerns  Assist level: Independent with assistive device Assistive device: Walker-rolling    Walk 10 feet on uneven surface  activity   Assist     Assist level: Supervision/Verbal cueing Assistive device: Walker-rolling   Wheelchair     Assist Is the patient using a wheelchair?: No Type of Wheelchair: Manual    Wheelchair assist level: Dependent -  Patient 0%      Wheelchair 50 feet with 2 turns activity    Assist        Assist Level: Dependent - Patient 0%   Wheelchair 150 feet activity     Assist      Assist Level: Dependent - Patient 0%   Blood pressure (!) 140/79, pulse 77, temperature 97.9 F (36.6 C), temperature source Oral, resp. rate 18, height 5\' 6"  (1.676 m), weight (!) 149.7 kg, SpO2 95%.  Medical Problem List and Plan: 1. 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  Mild  inconsistent deficits             -patient may shower             -ELOS/Goals:             Plan D/C 12/30, HHPT,  OT, HHA , do not anticipate need for SLP   -12/29 pt is fluid overloaded, may need to hold dc 2.  Antithrombotics: -DVT/anticoagulation:  Pharmaceutical: Lovenox             -antiplatelet therapy: Plavix 75 mg daily.  Patient is allergic to aspirin 3. Pain Management: Hydrocodone 1 tablet every 4 hours as needed 4. Mood/Behavior/Sleep: Cymbalta 30 mg daily, trazodone 200 mg nightly, Atarax as needed             -antipsychotic agents: N/A  -12/23: Waking up in the middle of the night, adding melatonin 5 mg as needed   5. Neuropsych/cognition: This patient is capable of making decisions on his own behalf. 6. Skin/Wound Care: Routine skin checks 7. Fluids/Electrolytes/Nutrition: Routine in and outs with follow-up chemistries  12-22: Labs appear stable  8.  Acute/chronic respiratory failure.  Monitor oxygen saturations  -Respiratory status has been stable 9.  OSA.  CPAP 10.  Morbid obesity.  BMI 50.75.  Dietary follow-up 11.  Poorly controlled diabetes mellitus.  Hemoglobin A1c 12. Increase  Semglee 50 units daily, NovoLog 15 units 3 times daily.  Diabetic teaching   - Farxiga 10 mg daily per cardiology for GDMT  -Uncontrolled increasing Semglee 12/21 to 55U, increase to 60U 12/22-received new dose last night, hold  on adjustments today    CBG (last 3)  Recent Labs    06/26/23 1708 06/26/23 2103 06/27/23 0537  GLUCAP 289* 289* 179*  Am still elevated increase semglee to 35U BID, cont to monitor on current dose  12/28 remains uncontrolled. Increased semglee to 38u bid 12/29 some improvement today, adjust semglee to 40u bid 12.  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 Farxiga 10 mg daily, Lanoxin 0.125 mg daily, Bumex 2 mg twice daily, Aldactone 25 mg daily.  Cardiology consulted to adjust GDMT and ensure f/u , not sure if pt may need Heart failure team to manageMonitor for any signs of fluid overload. Daily weights ordered  Filed Weights   06/16/23 1750 06/20/23 0500 06/27/23 0500  Weight: (!) 141.7 kg (!) 145.2 kg (!) 149.7 kg   12/29 weights trending up. 2 to 3+ LE edema, +orthopnea, sob sitting up too  -today's cxr similar to 12/14 study--offical reading pending  - spironolactone 12.5 mg daily already on board  -begin iv lasix 40mg  q8 once access obtained. Start with po dose first  -resume home bumex pending response to lasix  -start po potassium  -monitor labs   carvedilol 3.125 mg twice daily  On home Entresto 97-23 mg twice daily  Continue digoxin  -may need cardiology follow up on Monday   13.  Hypertension. Continue Toprol-XL 150 mg daily, Minipress 2 mg nightly.  Monitor with increased mobility    06/27/2023    5:38 AM 06/27/2023    5:00 AM 06/26/2023    7:39 PM  Vitals with BMI  Weight  330 lbs   BMI  53.29   Systolic 140  113  Diastolic 79  56  Pulse 77  88  12/29 controlled  14.  Hyperlipidemia.  Continue Crestor 15.  GERD.  Continue Protonix 16.  History of tobacco as well as polysubstance abuse.  Counseling  17.  Mild Hyper K+ likely due to aldactone resolved     Latest Ref Rng & Units 06/24/2023    9:36 AM 06/23/2023    7:23 AM 06/22/2023    8:46 AM  BMP  Glucose 70 - 99 mg/dL 161  096  045   BUN 6 - 20 mg/dL 15  16  17     Creatinine 0.61 - 1.24 mg/dL 4.09  8.11  9.14   Sodium 135 - 145 mmol/L 136  133  137   Potassium 3.5 - 5.1 mmol/L 4.3  4.0  4.3   Chloride 98 - 111 mmol/L 103  100  105   CO2 22 - 32 mmol/L 24  22  22    Calcium 8.9 - 10.3 mg/dL 9.1  9.0  9.0    18. Congestion: c/o fullness in left ear  -added sudafed short term  LOS: 11 days A FACE TO FACE EVALUATION WAS PERFORMED  Ranelle Oyster 06/27/2023, 10:03 AM

## 2023-06-27 NOTE — Progress Notes (Signed)
Physical Therapy Session Note  Patient Details  Name: Luis [name] MRN: [id number] Date of Birth: Oct 18, 1970  Today's Date: 06/27/2023 PT Individual Time: 1002-1111 PT Individual Time Calculation (min): 69 min   Short Term Goals: Week 1:  PT Short Term Goal 1 (Week 1): = to LTGs based on ELOS  Skilled Therapeutic Interventions/Progress Updates:     Pt received semi reclined in bed and agrees to therapy. Reports pain in both knees. PT provides rest breaks and gentle mobility to manage pain. Pt performs all mobility at mod(I) level. Pt completes bed mobility and sit to stand with RW, then ambulates around room, collecting dirty clothes with RW to work on functional independence and endurance. Pt able to reach nearly to floor to pick up clothes, then places them in bag and carries clothes to washing machine, managing without assistance. Pt ambulates to gym and takes seated rest break. Following extended seated rest break, pt ambulates 2x750' with RW to work on endurance and ambulation. Pt also ambulates 2x100' without AD, with cues for upright gaze to improve posture and balance, and increasing push off with Rt toes during terminal stance to promote improved swing through and optimal gait mechanics. Pt left seated in recliner with all needs within reach.   Therapy Documentation Precautions:  Precautions Precautions: Fall, Other (comment) Precaution Comments: 2L of O2 via nasal cannula baseline (pt reports using it PRN), mild R hemi (LE>UE) Restrictions Weight Bearing Restrictions Per Provider Order: No   Therapy/Group: Individual Therapy  Beau Fanny, PT, DPT 06/27/2023, 3:46 PM

## 2023-06-28 ENCOUNTER — Other Ambulatory Visit (HOSPITAL_COMMUNITY): Payer: Self-pay

## 2023-06-28 DIAGNOSIS — I639 Cerebral infarction, unspecified: Secondary | ICD-10-CM | POA: Diagnosis not present

## 2023-06-28 LAB — BASIC METABOLIC PANEL
Anion gap: 13 (ref 5–15)
BUN: 16 mg/dL (ref 6–20)
CO2: 23 mmol/L (ref 22–32)
Calcium: 8.7 mg/dL — ABNORMAL LOW (ref 8.9–10.3)
Chloride: 101 mmol/L (ref 98–111)
Creatinine, Ser: 0.89 mg/dL (ref 0.61–1.24)
GFR, Estimated: >60 mL/min (ref >60)
Glucose, Bld: 172 mg/dL — ABNORMAL HIGH (ref 70–99)
Potassium: 3.8 mmol/L (ref 3.5–5.1)
Sodium: 137 mmol/L (ref 135–145)

## 2023-06-28 LAB — GLUCOSE, CAPILLARY
Glucose-Capillary: 176 mg/dL — ABNORMAL HIGH (ref 70–99)
Glucose-Capillary: 159 mg/dL — ABNORMAL HIGH (ref 70–99)
Glucose-Capillary: 216 mg/dL — ABNORMAL HIGH (ref 70–99)
Glucose-Capillary: 286 mg/dL — ABNORMAL HIGH (ref 70–99)

## 2023-06-28 MED ORDER — INSULIN PEN NEEDLE 32G X 4 MM MISC
0 refills | Status: DC
  Filled 2023-06-28: qty 100, 30d supply, fill #0

## 2023-06-28 MED ORDER — ROSUVASTATIN CALCIUM 20 MG PO TABS
20.0000 mg | ORAL_TABLET | Freq: Every day | ORAL | 0 refills | Status: DC
  Filled 2023-06-28: qty 30, 30d supply, fill #0

## 2023-06-28 MED ORDER — INSULIN GLARGINE 100 UNIT/ML SOLOSTAR PEN
38.0000 [IU] | PEN_INJECTOR | Freq: Two times a day (BID) | SUBCUTANEOUS | 11 refills | Status: DC
  Filled 2023-06-28: qty 15, 20d supply, fill #0

## 2023-06-28 MED ORDER — CLOPIDOGREL BISULFATE 75 MG PO TABS
75.0000 mg | ORAL_TABLET | Freq: Every day | ORAL | 0 refills | Status: DC
  Filled 2023-06-28: qty 30, 30d supply, fill #0

## 2023-06-28 MED ORDER — ISONIAZID 300 MG PO TABS
300.0000 mg | ORAL_TABLET | Freq: Every day | ORAL | 0 refills | Status: DC
  Filled 2023-06-28: qty 30, 30d supply, fill #0

## 2023-06-28 MED ORDER — SACUBITRIL-VALSARTAN 97-103 MG PO TABS
1.0000 | ORAL_TABLET | Freq: Two times a day (BID) | ORAL | 0 refills | Status: DC
  Filled 2023-06-28: qty 60, 30d supply, fill #0

## 2023-06-28 MED ORDER — DULOXETINE HCL 30 MG PO CPEP
30.0000 mg | ORAL_CAPSULE | Freq: Every day | ORAL | 0 refills | Status: DC
  Filled 2023-06-28: qty 30, 30d supply, fill #0

## 2023-06-28 MED ORDER — POTASSIUM CHLORIDE CRYS ER 20 MEQ PO TBCR
20.0000 meq | EXTENDED_RELEASE_TABLET | Freq: Every day | ORAL | 0 refills | Status: DC
  Filled 2023-06-28: qty 30, 30d supply, fill #0

## 2023-06-28 MED ORDER — BUMETANIDE 2 MG PO TABS
2.0000 mg | ORAL_TABLET | Freq: Two times a day (BID) | ORAL | Status: DC
  Filled 2023-06-28: qty 1

## 2023-06-28 MED ORDER — GABAPENTIN 300 MG PO CAPS
300.0000 mg | ORAL_CAPSULE | Freq: Two times a day (BID) | ORAL | Status: DC
  Administered 2023-06-28 – 2023-06-29 (×3): 300 mg via ORAL
  Filled 2023-06-28 (×3): qty 1

## 2023-06-28 MED ORDER — DAPAGLIFLOZIN PROPANEDIOL 10 MG PO TABS
10.0000 mg | ORAL_TABLET | Freq: Every day | ORAL | 0 refills | Status: DC
  Filled 2023-06-28: qty 30, 30d supply, fill #0

## 2023-06-28 MED ORDER — FUROSEMIDE 10 MG/ML IJ SOLN
40.0000 mg | Freq: Two times a day (BID) | INTRAMUSCULAR | Status: DC
  Administered 2023-06-28 – 2023-06-29 (×3): 40 mg via INTRAVENOUS
  Filled 2023-06-28 (×3): qty 4

## 2023-06-28 MED ORDER — HYDROCODONE-ACETAMINOPHEN 5-325 MG PO TABS
1.0000 | ORAL_TABLET | ORAL | 0 refills | Status: DC | PRN
  Filled 2023-06-28: qty 30, 5d supply, fill #0

## 2023-06-28 MED ORDER — GABAPENTIN 300 MG PO CAPS
300.0000 mg | ORAL_CAPSULE | Freq: Two times a day (BID) | ORAL | 0 refills | Status: DC
  Filled 2023-06-28: qty 60, 30d supply, fill #0

## 2023-06-28 MED ORDER — DIGOXIN 125 MCG PO TABS
0.1250 mg | ORAL_TABLET | Freq: Every day | ORAL | 0 refills | Status: DC
  Filled 2023-06-28: qty 30, 30d supply, fill #0

## 2023-06-28 MED ORDER — NOVOLOG FLEXPEN 100 UNIT/ML ~~LOC~~ SOPN
15.0000 [IU] | PEN_INJECTOR | Freq: Three times a day (TID) | SUBCUTANEOUS | 11 refills | Status: DC
  Filled 2023-06-28: qty 15, 30d supply, fill #0

## 2023-06-28 MED ORDER — SPIRONOLACTONE 25 MG PO TABS
25.0000 mg | ORAL_TABLET | Freq: Every day | ORAL | 0 refills | Status: DC
  Filled 2023-06-28: qty 30, 30d supply, fill #0

## 2023-06-28 MED ORDER — PYRIDOXINE HCL 100 MG PO TABS
100.0000 mg | ORAL_TABLET | Freq: Every day | ORAL | 0 refills | Status: DC
  Filled 2023-06-28: qty 30, 30d supply, fill #0

## 2023-06-28 MED ORDER — BUMETANIDE 2 MG PO TABS
2.0000 mg | ORAL_TABLET | Freq: Two times a day (BID) | ORAL | 0 refills | Status: DC
  Filled 2023-06-28: qty 60, 30d supply, fill #0

## 2023-06-28 MED ORDER — CARVEDILOL 3.125 MG PO TABS
3.1250 mg | ORAL_TABLET | Freq: Two times a day (BID) | ORAL | 0 refills | Status: DC
  Filled 2023-06-28: qty 60, 30d supply, fill #0

## 2023-06-28 MED ORDER — PANTOPRAZOLE SODIUM 40 MG PO TBEC
40.0000 mg | DELAYED_RELEASE_TABLET | Freq: Every day | ORAL | 0 refills | Status: DC
  Filled 2023-06-28: qty 30, 30d supply, fill #0

## 2023-06-28 MED ORDER — TRAZODONE HCL 100 MG PO TABS
300.0000 mg | ORAL_TABLET | Freq: Every day | ORAL | 0 refills | Status: DC
  Filled 2023-06-28: qty 90, 30d supply, fill #0

## 2023-06-28 MED ORDER — PRAZOSIN HCL 2 MG PO CAPS
2.0000 mg | ORAL_CAPSULE | Freq: Every day | ORAL | 0 refills | Status: DC
  Filled 2023-06-28: qty 30, 30d supply, fill #0

## 2023-06-28 NOTE — Progress Notes (Signed)
Patient concerned this AM R/T swelling and weight gain. Also he doesn't feel he's getting enough insulin. He feels his feet hurt because of swelling. Luis [name]

## 2023-06-28 NOTE — Progress Notes (Addendum)
Rounding Note    Patient Name: Luis [name] Date of Encounter: 06/28/2023  [facility] Cardiologist: Bryan Lemma, MD   Subjective   Pt has not been consistently on his home Bumex 2 mg bid, he went 9 days w/out diuretic  He is upset because he feels that made him gain a lot of fluid. He feels he needs to get some fluid off before he goes home.  He describes orthopnea, PND and abd tightness. He also has developed LE edema.   He says his dry wt is 305 lbs  Inpatient Medications    Scheduled Meds:  bumetanide  2 mg Oral BID   carvedilol  3.125 mg Oral BID WC   clopidogrel  75 mg Oral Daily   dapagliflozin propanediol  10 mg Oral Daily   digoxin  0.125 mg Oral Daily   DULoxetine  30 mg Oral Daily   gabapentin  300 mg Oral BID   insulin aspart  0-20 Units Subcutaneous TID PC & HS   insulin aspart  15 Units Subcutaneous TID WC   insulin glargine-yfgn  38 Units Subcutaneous BID   isoniazid  300 mg Oral Daily   nystatin cream   Topical BID   pantoprazole  40 mg Oral Daily   potassium chloride  20 mEq Oral Daily   prazosin  2 mg Oral QHS   pseudoephedrine  120 mg Oral BID   pyridOXINE  100 mg Oral Daily   rosuvastatin  20 mg Oral Daily   sacubitril-valsartan  1 tablet Oral BID   spironolactone  25 mg Oral Daily   trazodone  300 mg Oral QHS   Continuous Infusions:  PRN Meds: acetaminophen **OR** acetaminophen (TYLENOL) oral liquid 160 mg/5 mL **OR** acetaminophen, HYDROcodone-acetaminophen, hydrOXYzine, loratadine, melatonin, senna-docusate, sodium chloride   Vital Signs    Vitals:   06/27/23 1341 06/27/23 1933 06/27/23 2218 06/28/23 0509  BP: (!) 142/91 (!) 116/57 122/81 112/72  Pulse: 89 86 88 84  Resp: 17 18  20   Temp: 98.6 F (37 C) 98.4 F (36.9 C)  98.3 F (36.8 C)  TempSrc:  Oral  Oral  SpO2: 96% 97%  96%  Weight:    (!) 147.8 kg  Height:        Intake/Output Summary (Last 24 hours) at 06/28/2023 1610 Last data filed at  06/28/2023 9604 Gross per 24 hour  Intake 1500 ml  Output 3425 ml  Net -1925 ml      06/28/2023    5:09 AM 06/27/2023    5:00 AM 06/20/2023    5:00 AM  Last 3 Weights  Weight (lbs) 325 lb 14.4 oz 330 lb 320 lb 1.7 oz  Weight (kg) 147.827 kg 149.687 kg 145.2 kg      Telemetry    Not on - Personally Reviewed  ECG    None today- Personally Reviewed  Physical Exam   General: Well developed, well nourished, male in no acute distress Head: Eyes PERRLA, Head normocephalic and atraumatic Lungs: decreased BS bases bilaterally to auscultation, taking deep breaths makes him cough Heart: HRRR S1 S2, without rub or gallop. No murmur. 4/4 extremity pulses are 2+ & equal. No JVD seen, difficult to assess 2nd body habitus Abdomen: Bowel sounds are present, abdomen firm but non-tender without masses or  hernias noted. Msk: weak strength and tone for age. Extremities: No clubbing, cyanosis, has 2+ LE edema.    Skin:  No rashes or lesions noted. Neuro: Alert and oriented X 3.  Psych:  Good affect, responds appropriately   Labs    High Sensitivity Troponin:   Recent Labs  Lab 06/12/23 1715 06/12/23 1840  TROPONINIHS 15 17     Chemistry Recent Labs  Lab 06/23/23 0723 06/24/23 0936 06/28/23 0554  NA 133* 136 137  K 4.0 4.3 3.8  CL 100 103 101  CO2 22 24 23   GLUCOSE 264* 274* 172*  BUN 16 15 16   CREATININE 0.97 1.07 0.89  CALCIUM 9.0 9.1 8.7*  GFRNONAA >60 >60 >60  ANIONGAP 11 9 13     Lipids No results for input(s): "CHOL", "TRIG", "HDL", "LABVLDL", "LDLCALC", "CHOLHDL" in the last 168 hours.  Hematology No results for input(s): "WBC", "RBC", "HGB", "HCT", "MCV", "MCH", "MCHC", "RDW", "PLT" in the last 168 hours.  Thyroid No results for input(s): "TSH", "FREET4" in the last 168 hours.  BNP No results for input(s): "BNP", "PROBNP" in the last 168 hours.   DDimer No results for input(s): "DDIMER" in the last 168 hours.   Radiology    DG Chest 2 View Result Date:  06/27/2023 CLINICAL DATA:  52 year old male with history of shortness of breath. EXAM: CHEST - 2 VIEW COMPARISON:  Chest x-ray 06/12/2023. FINDINGS: Lung volumes are normal. No consolidative airspace disease. No pleural effusions. No pneumothorax. No evidence of pulmonary edema. Heart size is mildly enlarged. Upper mediastinal contours are within normal limits. Left-sided biventricular pacemaker/AICD noted with lead tips projecting over the expected location of the right atrium, right ventricle and overlying the lateral wall the left ventricle via the coronary sinus and coronary veins. IMPRESSION: 1. No radiographic evidence of acute cardiopulmonary disease. 2. Cardiomegaly. Electronically Signed   By: Trudie Reed M.D.   On: 06/27/2023 12:17    Cardiac Studies    Echo: 06/14/2023   IMPRESSIONS   1. Left ventricular ejection fraction, by estimation, is 25 to 30%. The left ventricle has normal function. The left ventricle demonstrates global hypokinesis. Left ventricular diastolic parameters are consistent with Grade I diastolic dysfunction  (impaired relaxation).   2. Right ventricular systolic function is mildly reduced. The right  ventricular size is normal. Tricuspid regurgitation signal is inadequate for assessing PA pressure.   3. The mitral valve is normal in structure. No evidence of mitral valve regurgitation.   4. The aortic valve is tricuspid. Aortic valve regurgitation is not  visualized. No aortic stenosis is present.   5. The inferior vena cava is normal in size with greater than 50% respiratory variability, suggesting right atrial pressure of 3 mmHg.   Patient Profile     52 y.o. male with a hx of chronic systolic heart failure (s/p reported BiV ICD) VT status post ICD (MDT), hypertension, hyperlipidemia, DM (poorly controlled), OSA on CPAP, substance abuse, PTSD, Jehovah's Witness who is being seen 06/18/2023 for the evaluation of CHF, meds   Assessment & Plan    HFrEF NICM - echo 12/16 showed LVEF 25-30% with mildly decreased RV function - PTA Bumex 2mg  BID (per patient report), he was off this x 9 days, restarted today - during that time, he gained 8 kg - he has gotten 3 doses IV Lasix 40 mg in the last 24 hr >> wt down 2 kg - would continue this - hold Bumex while on IV Lasix, restart at d/c - spiro  25mg  daily, digoxin 0.125mg  daily, and Farxiga 10 mg every day are also home meds - coreg 3.125mg  BID is new, replaces Metop XL 150 mg qd - Entresto stopped 12/18,  but restarted 06/22/23 Entresto 97-103mg  BID - also on prazosin 2 mg qd - does not appear significantly volume up, but difficult given body habitus - ck BMET in am - has f/u 01/08 in CHF clinic   H/o VT s/p ICD - followed by Dr. Nelly Laurence   Chronic respiratory failure -On 2L O2 chronically   HTN -  SBP range 112-142 last 24 hr - HR generally 70s-80s - discuss decreasing prazosin and increasing BB w/ MD   OSA - continue CPAP   Hyperkalemia - resolved  For questions or updates, please contact [facility] Please consult [email or web address] for contact info under        Signed, Theodore Demark, PA-C  06/28/2023, 9:33 AM     Patient seen and examined with RB PA-C.  Agree as above, with the following exceptions and changes as noted below. Notes feeling of congestion in abdomen and LE. Gen: NAD, CV: RRR, no murmurs, Lungs: clear, Abd: full, soft, Extrem: Warm, well perfused, 1+ edema, Neuro/Psych: alert and oriented x 3, normal mood and affect. All available labs, radiology testing, previous records reviewed. Agree as above, will diurese with IV lasix and monitor renal function and potassium, orders for diuretic adjusted by RB PA-C and confirmed.   Parke Poisson, MD 06/28/23 10:47 AM

## 2023-06-28 NOTE — Progress Notes (Addendum)
 Inpatient Rehabilitation Discharge Medication Review by a Pharmacist  A complete drug regimen review was completed for this patient to identify any potential clinically significant medication issues.  High Risk Drug Classes Is patient taking? Indication by Medication  Antipsychotic No   Anticoagulant No   Antibiotic Yes  Isoniazid  (with pyridoxine )- Latent TB   Opioid Yes Hydrocodone /APAP-pain  Antiplatelet Yes Plavix - CVA   Hypoglycemics/insulin  Yes insulin  glargine-yfgn, aspart   Vasoactive Medication Yes Prazosin - HTN Bumetanide , digoxin , coreg , Entresto , spironolactone , dapagliflozin  - CHF  Chemotherapy No   Other Yes Acetaminophen -pain  Duloxetine , trazodone , prazosin - mood behavior Pantoprazole - GERD Rosuvastatin - HLD Gabapentin - neuropathy  Hydroxyzine  PRN- anxiety  Potassium chloride - supplement for diuresis      Type of Medication Issue Identified Description of Issue Recommendation(s)  Drug Interaction(s) (clinically significant)     Duplicate Therapy     Allergy     No Medication Administration End Date     Incorrect Dose     Additional Drug Therapy Needed     Significant med changes from prior encounter (inform family/care partners about these prior to discharge).   Stopped: Toprol  New: Coreg , gabapentin   Educate on Medication changes prior to discharge   Other        Clinically significant medication issues were identified that warrant physician communication and completion of prescribed/recommended actions by midnight of the next day:    Name of provider notified for urgent issues identified: No  Provider Method of Notification:    Pharmacist comments:   Time spent performing this drug regimen review (minutes):  30   Massie Fila, PharmD Clinical Pharmacist  06/28/2023 9:20 AM

## 2023-06-28 NOTE — Progress Notes (Signed)
Patient ID: Luis [name], male   DOB: 19-Oct-1970, 52 y.o.   MRN: [id number]  SW received updates from medical team pt will not d/c today. SW updated [facility] on change in d/c date. Will follow-up with updates.   Cecile Sheerer, MSW, LCSW Office: [phone] Cell: [phone] Fax: [phone]

## 2023-06-28 NOTE — Progress Notes (Signed)
Patient ID: Luis [name], male   DOB: 1971-04-19, 52 y.o.   MRN: [id number]  Met with pt to introduce myself and let him now his appeal is for medicare which he is not here under. He is under his [facility] and will need to check with them to see if can appeal if feels not ready to go home when MD feels he is medically ready. Pt was complaining he has not sleeping informed him he would sleep better at home than here. He agreed. Will touch base with tomorrow.

## 2023-06-28 NOTE — Plan of Care (Signed)
  Problem: Consults Goal: RH GENERAL PATIENT EDUCATION Description: See Patient Education module for education specifics. Outcome: Progressing   Problem: RH SKIN INTEGRITY Goal: RH STG MAINTAIN SKIN INTEGRITY WITH ASSISTANCE Description: STG Maintain Skin Integrity With Mod I Assistance. Outcome: Progressing   Problem: RH SAFETY Goal: RH STG ADHERE TO SAFETY PRECAUTIONS W/ASSISTANCE/DEVICE Description: STG Adhere to Safety Precautions With Mod I Assistance/Device. Outcome: Progressing   Problem: RH PAIN MANAGEMENT Goal: RH STG PAIN MANAGED AT OR BELOW PT'S PAIN GOAL Description: < 2 on a 0/10 pain scale. Outcome: Progressing   Problem: RH KNOWLEDGE DEFICIT GENERAL Goal: RH STG INCREASE KNOWLEDGE OF SELF CARE AFTER HOSPITALIZATION Description: Patient will demonstrate knowledge of medication management, diabetes management, safety awareness with educational materials and handouts provided by staff during rehabilitation admission. Outcome: Progressing

## 2023-06-28 NOTE — Progress Notes (Addendum)
PROGRESS NOTE   Subjective/Complaints:  No SOB today CXR reviewed, pt c/o bilateral foot/leg pain, "swelling " No falls or trauma Is ambulatory in room   ROS: Patient denies fever, rash, sore throat, blurred vision, dizziness, nausea, vomiting, diarrhea, cough, shortness of breath or chest pain, joint or back/neck pain, headache, or mood change.    Objective:   DG Chest 2 View Result Date: 06/27/2023 CLINICAL DATA:  52 year old male with history of shortness of breath. EXAM: CHEST - 2 VIEW COMPARISON:  Chest x-ray 06/12/2023. FINDINGS: Lung volumes are normal. No consolidative airspace disease. No pleural effusions. No pneumothorax. No evidence of pulmonary edema. Heart size is mildly enlarged. Upper mediastinal contours are within normal limits. Left-sided biventricular pacemaker/AICD noted with lead tips projecting over the expected location of the right atrium, right ventricle and overlying the lateral wall the left ventricle via the coronary sinus and coronary veins. IMPRESSION: 1. No radiographic evidence of acute cardiopulmonary disease. 2. Cardiomegaly. Electronically Signed   By: Trudie Reed M.D.   On: 06/27/2023 12:17   No results for input(s): "WBC", "HGB", "HCT", "PLT" in the last 72 hours.   Recent Labs    06/28/23 0554  NA 137  K 3.8  CL 101  CO2 23  GLUCOSE 172*  BUN 16  CREATININE 0.89  CALCIUM 8.7*     Intake/Output Summary (Last 24 hours) at 06/28/2023 0900 Last data filed at 06/28/2023 4098 Gross per 24 hour  Intake 1500 ml  Output 3425 ml  Net -1925 ml        Physical Exam: Vital Signs Blood pressure 112/72, pulse 84, temperature 98.3 F (36.8 C), temperature source Oral, resp. rate 20, height 5\' 6"  (1.676 m), weight (!) 147.8 kg, SpO2 96%.    General: No acute distress Mood and affect are appropriate Heart: Regular rate and rhythm no rubs murmurs or extra sounds Lungs: Clear to  auscultation, breathing unlabored, no rales or wheezes Abdomen: Positive bowel sounds, soft nontender to palpation, nondistended Extremities: No clubbing, cyanosis, or edema Skin: No evidence of breakdown, no evidence of rash  Ext: no clubbing, cyanosis, Trace /1+ pitting edema bilateral pretibial but not in pedal area,  Psych: pleasant and cooperative  Skin: No evidence of breakdown, no evidence of rash Neurologic: Awake, alert, and oriented x 3.   Cranial nerves II through XII intact,  motor strength 4/5 right and 5/5 left upper and lower extremities. Good sitting balance  Musculoskeletal: Full range of motion in all 4 extremities. No joint swelling   Assessment/Plan: 1. 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 Stable for D/C today F/u PCP in 1-2 weeks in [place] F/u cardiology 3-4wks No PM&R f/u needed  See D/C summary See D/C instructions   Care Tool:  Bathing    Body parts bathed by patient: Right arm, Left arm, Chest, Abdomen, Front perineal area, Buttocks, Right upper leg, Left upper leg, Right lower leg, Left lower leg, Face         Bathing assist Assist Level: Independent with assistive device     Upper Body Dressing/Undressing Upper body dressing   What is the patient wearing?: Pull over shirt    Upper body  assist Assist Level: Independent with assistive device    Lower Body Dressing/Undressing Lower body dressing      What is the patient wearing?: Pants, Underwear/pull up     Lower body assist Assist for lower body dressing: Independent with assitive device     Toileting Toileting    Toileting assist Assist for toileting: Independent with assistive device     Transfers Chair/bed transfer  Transfers assist     Chair/bed transfer assist level: Independent with assistive device Chair/bed transfer assistive device: Geologist, engineering   Ambulation assist      Assist level: Independent with assistive device Assistive  device: Walker-rolling Max distance: 750'   Walk 10 feet activity   Assist     Assist level: Independent with assistive device Assistive device: Walker-rolling   Walk 50 feet activity   Assist    Assist level: Independent with assistive device Assistive device: Walker-rolling    Walk 150 feet activity   Assist Walk 150 feet activity did not occur: Safety/medical concerns  Assist level: Independent with assistive device Assistive device: Walker-rolling    Walk 10 feet on uneven surface  activity   Assist     Assist level: Supervision/Verbal cueing Assistive device: Walker-rolling   Wheelchair     Assist Is the patient using a wheelchair?: No Type of Wheelchair: Manual    Wheelchair assist level: Dependent - Patient 0%      Wheelchair 50 feet with 2 turns activity    Assist        Assist Level: Dependent - Patient 0%   Wheelchair 150 feet activity     Assist      Assist Level: Dependent - Patient 0%   Blood pressure 112/72, pulse 84, temperature 98.3 F (36.8 C), temperature source Oral, resp. rate 20, height 5\' 6"  (1.676 m), weight (!) 147.8 kg, SpO2 96%.  Medical Problem List and Plan: 1. 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  Mild  inconsistent deficits             -patient may shower             -ELOS/Goals:             Plan D/C 12/30, HHPT,  OT, HHA , do not anticipate need for SLP   -no longer with fluid overload, LE pain due to neuropathy , minimal pretib edema after IV lasix will be restarted on bumex, Pt requests cardiology re eval prior to discharge , have contacted cardiology  2.  Antithrombotics: -DVT/anticoagulation:  Pharmaceutical: Lovenox             -antiplatelet therapy: Plavix 75 mg daily.  Patient is allergic to aspirin 3. Pain Management: Hydrocodone 1 tablet every 4 hours as needed Resume gabapentin  300mg  BID for neuropathy related pain has diabetes with poor control as well as Isoniazid therapy with B6 supplementation  4. Mood/Behavior/Sleep: Cymbalta 30 mg daily, trazodone 200 mg nightly, Atarax as needed             -antipsychotic agents: N/A  -12/23: Waking up in the middle of the night, adding melatonin 5 mg as needed   5. Neuropsych/cognition: This patient is capable of making decisions on his own behalf. 6. Skin/Wound Care: Routine skin checks 7. Fluids/Electrolytes/Nutrition: Routine in and outs with follow-up chemistries  12-22: Labs appear stable  8.  Acute/chronic respiratory failure.  Monitor oxygen saturations  -  Respiratory status has been stable 9.  OSA.  CPAP 10.  Morbid obesity.  BMI 50.75.  Dietary follow-up 11.  Poorly controlled diabetes mellitus.  Hemoglobin A1c 12. Increase  Semglee 50 units daily, NovoLog 15 units 3 times daily.  Diabetic teaching   - Farxiga 10 mg daily per cardiology for GDMT  -Uncontrolled increasing Semglee 12/21 to 55U, increase to 60U 12/22-received new dose last night, hold on adjustments today    CBG (last 3)  Recent Labs    06/27/23 1646 06/27/23 2042 06/28/23 0556  GLUCAP 268* 316* 176*  Am still elevated increase semglee to 35U BID, cont to monitor on current dose  12/28 remains uncontrolled. Increased semglee to 38u bid 12/29 some improvement today, adjust semglee to 40u bid 12.  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 Farxiga 10 mg daily, Lanoxin 0.125 mg daily, Bumex 2 mg twice daily, Aldactone 25 mg daily.  Cardiology consulted to adjust GDMT and ensure f/u , not sure if pt may need Heart failure team to manageMonitor for any signs of fluid overload. Daily weights ordered Filed Weights   06/20/23 0500 06/27/23 0500 06/28/23 0509  Weight: (!) 145.2 kg (!) 149.7 kg (!) 147.8 kg   12/30 down 2 kg vs yesterday, restarting Bumex    13.  Hypertension. Continue Toprol-XL 150 mg daily,  Minipress 2 mg nightly.  Monitor with increased mobility    06/28/2023    5:09 AM 06/27/2023   10:18 PM 06/27/2023    7:33 PM  Vitals with BMI  Weight 325 lbs 14 oz    BMI 52.63    Systolic 112 122 951  Diastolic 72 81 57  Pulse 84 88 86  12/31 controlled  14.  Hyperlipidemia.  Continue Crestor 15.  GERD.  Continue Protonix 16.  History of tobacco as well as polysubstance abuse.  Counseling  17.  Mild Hyper K+ likely due to aldactone resolved     Latest Ref Rng & Units 06/28/2023    5:54 AM 06/24/2023    9:36 AM 06/23/2023    7:23 AM  BMP  Glucose 70 - 99 mg/dL 884  166  063   BUN 6 - 20 mg/dL 16  15  16    Creatinine 0.61 - 1.24 mg/dL 0.16  0.10  9.32   Sodium 135 - 145 mmol/L 137  136  133   Potassium 3.5 - 5.1 mmol/L 3.8  4.3  4.0   Chloride 98 - 111 mmol/L 101  103  100   CO2 22 - 32 mmol/L 23  24  22    Calcium 8.9 - 10.3 mg/dL 8.7  9.1  9.0    Need to repeat in 1- 2 wks results to PCP at [facility], and Cardiology   LOS: 12 days A FACE TO FACE EVALUATION WAS PERFORMED  Victorino Sparrow Anselm Aumiller 06/28/2023, 9:00 AM

## 2023-06-29 ENCOUNTER — Other Ambulatory Visit (HOSPITAL_COMMUNITY): Payer: Self-pay

## 2023-06-29 ENCOUNTER — Telehealth (HOSPITAL_COMMUNITY): Payer: Self-pay | Admitting: Pharmacy Technician

## 2023-06-29 DIAGNOSIS — I639 Cerebral infarction, unspecified: Secondary | ICD-10-CM | POA: Diagnosis not present

## 2023-06-29 LAB — GLUCOSE, CAPILLARY: Glucose-Capillary: 203 mg/dL — ABNORMAL HIGH (ref 70–99)

## 2023-06-29 LAB — BASIC METABOLIC PANEL
Anion gap: 11 (ref 5–15)
BUN: 20 mg/dL (ref 6–20)
CO2: 26 mmol/L (ref 22–32)
Calcium: 8.8 mg/dL — ABNORMAL LOW (ref 8.9–10.3)
Chloride: 98 mmol/L (ref 98–111)
Creatinine, Ser: 1 mg/dL (ref 0.61–1.24)
GFR, Estimated: >60 mL/min (ref >60)
Glucose, Bld: 247 mg/dL — ABNORMAL HIGH (ref 70–99)
Potassium: 3.9 mmol/L (ref 3.5–5.1)
Sodium: 135 mmol/L (ref 135–145)

## 2023-06-29 MED ORDER — DEXCOM G7 SENSOR MISC
1.0000 | 0 refills | Status: DC
  Filled 2023-06-29: qty 3, 30d supply, fill #0

## 2023-06-29 NOTE — Progress Notes (Signed)
 PROGRESS NOTE   Subjective/Complaints:  Diuresed 6L in last 48H Appreciate Cardiology assist   ROS: Patient denies CP, SOB, N/V/D  Objective:   DG Chest 2 View Result Date: 06/27/2023 CLINICAL DATA:  52 year old male with history of shortness of breath. EXAM: CHEST - 2 VIEW COMPARISON:  Chest x-ray 06/12/2023. FINDINGS: Lung volumes are normal. No consolidative airspace disease. No pleural effusions. No pneumothorax. No evidence of pulmonary edema. Heart size is mildly enlarged. Upper mediastinal contours are within normal limits. Left-sided biventricular pacemaker/AICD noted with lead tips projecting over the expected location of the right atrium, right ventricle and overlying the lateral wall the left ventricle via the coronary sinus and coronary veins. IMPRESSION: 1. No radiographic evidence of acute cardiopulmonary disease. 2. Cardiomegaly. Electronically Signed   By: Toribio Aye M.D.   On: 06/27/2023 12:17   No results for input(s): WBC, HGB, HCT, PLT in the last 72 hours.   Recent Labs    06/28/23 0554 06/29/23 0527  NA 137 135  K 3.8 3.9  CL 101 98  CO2 23 26  GLUCOSE 172* 247*  BUN 16 20  CREATININE 0.89 1.00  CALCIUM  8.7* 8.8*     Intake/Output Summary (Last 24 hours) at 06/29/2023 0720 Last data filed at 06/28/2023 2003 Gross per 24 hour  Intake 880 ml  Output 2875 ml  Net -1995 ml        Physical Exam: Vital Signs Blood pressure (!) 109/51, pulse 93, temperature 98.6 F (37 C), resp. rate 17, height 5' 6 (1.676 m), weight (!) 146.7 kg, SpO2 93%.    General: No acute distress Mood and affect are appropriate Heart: Regular rate and rhythm no rubs murmurs or extra sounds Lungs: Clear to auscultation, breathing unlabored, no rales or wheezes Abdomen: Positive bowel sounds, soft nontender to palpation, nondistended Extremities: No clubbing, cyanosis, or edema Skin: No evidence of  breakdown, no evidence of rash  Ext: no clubbing, cyanosis, Trace /1+ pitting edema bilateral pretibial but not in pedal area,  Psych: pleasant and cooperative  Skin: No evidence of breakdown, no evidence of rash Neurologic: Awake, alert, and oriented x 3.   Cranial nerves II through XII intact,  motor strength 4/5 right and 5/5 left upper and lower extremities. Good sitting balance  Musculoskeletal: Full range of motion in all 4 extremities. No joint swelling   Assessment/Plan: 1. 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 Stable for D/C today F/u PCP in 1-2 weeks in [place] F/u cardiology 3-4wks No PM&R f/u needed  See D/C summary See D/C instructions   Care Tool:  Bathing    Body parts bathed by patient: Right arm, Left arm, Chest, Abdomen, Front perineal area, Buttocks, Right upper leg, Left upper leg, Right lower leg, Left lower leg, Face         Bathing assist Assist Level: Independent with assistive device     Upper Body Dressing/Undressing Upper body dressing   What is the patient wearing?: Pull over shirt    Upper body assist Assist Level: Independent with assistive device    Lower Body Dressing/Undressing Lower body dressing      What is the patient wearing?: Pants, Underwear/pull  up     Lower body assist Assist for lower body dressing: Independent with assitive device     Toileting Toileting    Toileting assist Assist for toileting: Independent with assistive device     Transfers Chair/bed transfer  Transfers assist     Chair/bed transfer assist level: Independent with assistive device Chair/bed transfer assistive device: Geologist, Engineering   Ambulation assist      Assist level: Independent with assistive device Assistive device: Walker-rolling Max distance: 750'   Walk 10 feet activity   Assist     Assist level: Independent with assistive device Assistive device: Walker-rolling   Walk 50 feet  activity   Assist    Assist level: Independent with assistive device Assistive device: Walker-rolling    Walk 150 feet activity   Assist Walk 150 feet activity did not occur: Safety/medical concerns  Assist level: Independent with assistive device Assistive device: Walker-rolling    Walk 10 feet on uneven surface  activity   Assist     Assist level: Supervision/Verbal cueing Assistive device: Walker-rolling   Wheelchair     Assist Is the patient using a wheelchair?: No Type of Wheelchair: Manual    Wheelchair assist level: Dependent - Patient 0%      Wheelchair 50 feet with 2 turns activity    Assist        Assist Level: Dependent - Patient 0%   Wheelchair 150 feet activity     Assist      Assist Level: Dependent - Patient 0%   Blood pressure (!) 109/51, pulse 93, temperature 98.6 F (37 C), resp. rate 17, height 5' 6 (1.676 m), weight (!) 146.7 kg, SpO2 93%.  Medical Problem List and Plan: 1. 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  Mild  inconsistent deficits             -patient may shower             -ELOS/Goals:             Plan D/C 12/31, HHPT,  OT, HHA , do not anticipate need for SLP   -no longer with fluid overload, LE pain due to neuropathy , minimal pretib edema after IV lasix  will be restarted on bumex ,received additional 40mg  Lasix  yesterday  2.  Antithrombotics: -DVT/anticoagulation:  Pharmaceutical: Lovenox              -antiplatelet therapy: Plavix  75 mg daily.  Patient is allergic to aspirin 3. Pain Management: Hydrocodone  1 tablet every 4 hours as needed Resume gabapentin  300mg  BID for neuropathy related pain has diabetes with poor control as well as Isoniazid  therapy with B6 supplementation  4. Mood/Behavior/Sleep: Cymbalta  30 mg daily, trazodone  200 mg nightly, Atarax  as needed             -antipsychotic agents:  N/A  -12/23: Waking up in the middle of the night, adding melatonin 5 mg as needed   5. Neuropsych/cognition: This patient is capable of making decisions on his own behalf. 6. Skin/Wound Care: Routine skin checks 7. Fluids/Electrolytes/Nutrition: Routine in and outs with follow-up chemistries  12-22: Labs appear stable  8.  Acute/chronic respiratory failure.  Monitor oxygen saturations  -Respiratory status has been stable 9.  OSA.  CPAP 10.  Morbid obesity.  BMI 50.75.  Dietary follow-up 11.  Poorly controlled diabetes mellitus.  Hemoglobin A1c 12. Increase  Semglee  50 units daily, NovoLog  15  units 3 times daily.  Diabetic teaching   - Farxiga  10 mg daily per cardiology for GDMT  -Uncontrolled increasing Semglee  12/21 to 55U, increase to 60U 12/22-received new dose last night, hold on adjustments today    CBG (last 3)  Recent Labs    06/28/23 1138 06/28/23 1709 06/28/23 2117  GLUCAP 159* 216* 286*  Pm CBGs elevated will need PCP f/u for this in the next 1-2 wks 12.  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 Farxiga  10 mg daily, Lanoxin  0.125 mg daily, Bumex  2 mg twice daily, Aldactone  25 mg daily.  Cardiology consulted to adjust GDMT and ensure f/u , not sure if pt may need Heart failure team to manageMonitor for any signs of fluid overload. Daily weights ordered Filed Weights   06/27/23 0500 06/28/23 0509 06/29/23 0500  Weight: (!) 149.7 kg (!) 147.8 kg (!) 146.7 kg   12/31 down 3kg vs 12/29 - f/u heart failure clinic  13.  Hypertension. Continue Toprol -XL 150 mg daily, Minipress  2 mg nightly.  Monitor with increased mobility    06/29/2023    5:00 AM 06/29/2023    3:24 AM 06/28/2023    8:02 PM  Vitals with BMI  Weight 323 lbs 7 oz    BMI 52.23    Systolic  109 128  Diastolic  51 71  Pulse  93 96  12/31 controlled  14.  Hyperlipidemia.  Continue Crestor  15.  GERD.  Continue Protonix  16.  History of tobacco as well as  polysubstance abuse.  Counseling  17.  Mild Hyper K+ likely due to aldactone  resolved     Latest Ref Rng & Units 06/29/2023    5:27 AM 06/28/2023    5:54 AM 06/24/2023    9:36 AM  BMP  Glucose 70 - 99 mg/dL 752  827  725   BUN 6 - 20 mg/dL 20  16  15    Creatinine 0.61 - 1.24 mg/dL 8.99  9.10  8.92   Sodium 135 - 145 mmol/L 135  137  136   Potassium 3.5 - 5.1 mmol/L 3.9  3.8  4.3   Chloride 98 - 111 mmol/L 98  101  103   CO2 22 - 32 mmol/L 26  23  24    Calcium  8.9 - 10.3 mg/dL 8.8  8.7  9.1    Need to repeat in 1- 2 wks results to PCP at [facility], and Cardiology   LOS: 13 days A FACE TO FACE EVALUATION WAS PERFORMED  Prentice FORBES Compton 06/29/2023, 7:20 AM

## 2023-06-29 NOTE — Progress Notes (Addendum)
 Inpatient Rehabilitation Care Coordinator Discharge Note   Patient Details  Name: Luis [name] MRN: [id number] Date of Birth: Mar 19, 1971   Discharge location: HOME WITH FIANCE AND FRIENDS TO CHECK ON WHILE SHE WORKS  Length of Stay: 13 days  Discharge activity level: MOD/I LEVEL  Home/community participation: SEDENTARY  Patient response un:Yzjouy Literacy - How often do you need to have someone help you when you read instructions, pamphlets, or other written material from your doctor or pharmacy?: Rarely  Patient response un:Dnrpjo Isolation - How often do you feel lonely or isolated from those around you?: Never  Services provided included: MD, RD, PT, OT, RN, CM, TR, Pharmacy, Neuropsych, SW  Financial Services:  Field Seismologist Utilized: [employer]  Choices offered to/list presented to: PT  Follow-up services arranged:  Home Health, Patient/Family has no preference for HH/DME agencies Home Health Agency: [facility]  PT  OT  RN AIDE SP     HAS ALL DME FROM PREVIOUS ADMISSIONS    Patient response to transportation need: Is the patient able to respond to transportation needs?: Yes In the past 12 months, has lack of transportation kept you from medical appointments or from getting medications?: No In the past 12 months, has lack of transportation kept you from meetings, work, or from getting things needed for daily living?: No   Patient/Family verbalized understanding of follow-up arrangements:  Yes  Individual responsible for coordination of the follow-up plan: SELF [phone]  Confirmed correct DME delivered: Raymonde Asberry MATSU 06/29/2023    Comments (or additional information):DC DELAYED ONE DAY DUE TO FLUID AND SEEN BY CARDIOLOGY. MEDICALLY STABLE FOR DC HOME TODAY AND PT AGREEABLE  Summary of Stay    Date/Time Discharge Planning CSW  06/21/23 1546 Discharging home with caregiver 24/7. CJB       Curran Lenderman G

## 2023-06-29 NOTE — Progress Notes (Addendum)
 Patient ID: Luis [name], male   DOB: 1971-06-04, 52 y.o.   MRN: [id number] pt requesting a ride home he requires O2 and also assist. Have arranged PT[facility] to transport home  9:38 AM PTAR set up for 12:00 transport home

## 2023-06-29 NOTE — Telephone Encounter (Signed)
 Patient Product/process Development Scientist completed.    The patient is insured through [employer]. Patient has Medicare and is not eligible for a copay card, but may be able to apply for patient assistance, if available.    Ran test claim for Dexcom G7 Sensor and the current 30 day co-pay is $0.00.   This test claim was processed through [facility]- copay amounts may vary at other pharmacies due to pharmacy/plan contracts, or as the patient moves through the different stages of their insurance plan.     Reyes Sharps, CPHT Pharmacy Technician III Certified Patient Advocate [facility] Patient Advocate Team Direct Number: [phone]  Fax: [phone]

## 2023-06-29 NOTE — Progress Notes (Signed)
 Patient Name: Luis [name] Date of Encounter: 06/29/2023 [facility] Cardiologist: Alm Clay, MD   Interval Summary  .    52 yr old male with PMH of chronic systolic heart failure, [employer] BiV ICD in situ, VT, HTN, HLD, type 2 DM, OSA on CPAP, remote substance abuse with cocaine, Obesity, anxiety/depression, who is admitted to [facility] after stroke like episode with right hemiparesis and hemisensory loss, CVA was ruled out. Cardiology is called to re-round yesterday for CHF regimen. He has been stopped on his PTA diuretic Bumex  for 9 days and  has been getting IV Lasix  40mg  BID since 12/29 due to c/o weight gain, orthopnea, PND, abdominal fullness, and LE edema. He reports dry weight 305ibs.   Patiet states he does not feel like he can go home, he felt he still has leg edema, can't sleep flat, wants stay longer. He is resting comfortably in bed, leg edema is trace, on chronic [place] oxygen, speaks full sentence.   Discussed with rehab medical team, reports patient had been expressing wishes to stay longer frequently, has underlying mental illness with behavior issue, progressed well from rehab perspective.   Net -15.8L, not accurate output. Weight is down from 330 to 323.42ib .   Vital Signs .    Vitals:   06/28/23 1502 06/28/23 2002 06/29/23 0324 06/29/23 0500  BP: 132/67 128/71 (!) 109/51   Pulse: 98 96 93   Resp: 18 18 17    Temp: 97.8 F (36.6 C) 99 F (37.2 C) 98.6 F (37 C)   TempSrc:      SpO2: 95% 95% 93%   Weight:    (!) 146.7 kg  Height:        Intake/Output Summary (Last 24 hours) at 06/29/2023 1030 Last data filed at 06/28/2023 2003 Gross per 24 hour  Intake 340 ml  Output 2450 ml  Net -2110 ml      06/29/2023    5:00 AM 06/28/2023    5:09 AM 06/27/2023    5:00 AM  Last 3 Weights  Weight (lbs) 323 lb 6.6 oz 325 lb 14.4 oz 330 lb  Weight (kg) 146.7 kg 147.827 kg 149.687 kg      Telemetry/ECG    N/A - Personally Reviewed  Physical Exam  .   GEN: No acute distress.  Morbidly obese Neck: No JVD Cardiac: RRR, no murmurs, rubs, or gallops.  Respiratory: Clear to auscultation bilaterally. On [place] oxygen.  GI: Soft, nontender, non-distended  MS: Trace BLE edema  Assessment & Plan .     Acute on chronic systolic heart failure  Non-ischemic cardiomyopathy  MDT CRT-D in situ, hx of VT - he has not followed up with AHF clinic since 04/2022 - R/LHC (10/23) showed elevated filling pressures and low output. mRA 14, PAP 45/22, mPCWP 27, LVEDP 32, PA sat 60%, CI 1.77. LHC showed no significant CAD.  - Most recent Echo 06/14/23 with LVEF 25-30%, grade I DD, mild reduced RV, IVC normal, no acute changes comparing to 02/2022 - admitted for stroke like symptoms, CVA ruled out by MRI, neuro felt presentation due to conversion reaction and recommend Plavix  for CVA prophylaxis, he was then admitted to [facility] on 06/16/23 for rehab, now being discharged to home  - mildly hypervolemic on exam today, due to holding Bumex  for sometime in [facility], resumed diuretic since 06/27/23 with IV Lasix , weight is coming down from 330 to 323.42 today, he can resume Bumex  2mg  BID today, OK to go home while  continue medication, re-assess weight and symptoms at next follow up  -GDMT: continue PTA Entresto  97/103 BID, spiro 25 mg, Farxiga  10 mg, coreg  3.125mg  BID,  digoxin  0.125mg  daily - Follow up arranged with AHF clinic 07/20/22, will need repeat labs, Cr is 1 today.   Stroke like symptoms with right sided weakness, CVA ruled out  Chronic respiratory failure with hypoxia  OSA Morbidy obesity  HLD HTN DM Anxiety/depression/PTSD - per primary team      For questions or updates, please contact [facility] Please consult [email or web address] for contact info under        Signed, Eythan Jayne, NP

## 2023-07-02 ENCOUNTER — Telehealth: Payer: Self-pay

## 2023-07-02 NOTE — Telephone Encounter (Signed)
 Transitional Care Call--who you spoke with Lemond Mars & the Caregiver   Are you/is patient experiencing any problems since coming home? No.  Are there any questions regarding any aspect of care? No.  Are there any questions regarding medications administration/dosing? No. Are meds being taken as prescribed? Yes.  Patient should review meds with caller to confirm. Medication list reviewed patient and Caregiver. Have there been any falls? None reported.  Has Home Health been to the house and/or have they contacted you? Yes.  If not, have you tried to contact them? No need. Can we help you contact them?  No. Are bowels and bladder emptying properly?Yes.  Are there any unexpected incontinence issues? None. If applicable, is patient following bowel/bladder programs?N/A  Any fevers, problems with breathing, unexpected pain? None. Are there any skin problems or new areas of breakdown? None. Has the patient/family member arranged specialty MD follow up (ie cardiology/neurology/renal/surgical/etc)? No. He will contact the insurance carrier for transportation.   Can we help arrange? Not needed.  Does the patient need any other services or support that we can help arrange? No. Patient has been advised to call back if any help is needed.  Are caregivers following through as expected in assisting the patient? Yes.         11. Has the patient quit smoking, drinking alcohol , or using drugs as recommended? Patient advised.    Appointment  [address] with Fidela Ned NP on 07/09/2023 at 10:00 am.

## 2023-07-07 ENCOUNTER — Encounter (HOSPITAL_COMMUNITY): Payer: Non-veteran care

## 2023-07-09 ENCOUNTER — Encounter: Payer: Medicare HMO | Attending: Registered Nurse | Admitting: Registered Nurse

## 2023-07-21 ENCOUNTER — Ambulatory Visit (HOSPITAL_COMMUNITY): Payer: Non-veteran care | Admitting: Cardiology

## 2023-08-08 ENCOUNTER — Other Ambulatory Visit: Payer: Self-pay

## 2023-08-08 ENCOUNTER — Encounter (HOSPITAL_COMMUNITY): Payer: Self-pay

## 2023-08-08 ENCOUNTER — Emergency Department (HOSPITAL_COMMUNITY)
Admission: EM | Admit: 2023-08-08 | Discharge: 2023-08-08 | Disposition: A | Discharge status: Home / Self Care | Payer: No Typology Code available for payment source | Attending: Emergency Medicine | Admitting: Emergency Medicine

## 2023-08-08 DIAGNOSIS — E1165 Type 2 diabetes mellitus with hyperglycemia: Secondary | ICD-10-CM | POA: Insufficient documentation

## 2023-08-08 DIAGNOSIS — M545 Low back pain, unspecified: Secondary | ICD-10-CM | POA: Insufficient documentation

## 2023-08-08 LAB — CBC WITH DIFFERENTIAL/PLATELET
Abs Immature Granulocytes: 0.04 10*3/uL (ref 0.00–0.07)
Basophils Absolute: 0 10*3/uL (ref 0.0–0.1)
Basophils Relative: 0 %
Eosinophils Absolute: 0 10*3/uL (ref 0.0–0.5)
Eosinophils Relative: 0 %
HCT: 41.1 % (ref 39.0–52.0)
Hemoglobin: 13.5 g/dL (ref 13.0–17.0)
Immature Granulocytes: 0 %
Lymphocytes Relative: 17 %
Lymphs Abs: 1.7 10*3/uL (ref 0.7–4.0)
MCH: 27.8 pg (ref 26.0–34.0)
MCHC: 32.8 g/dL (ref 30.0–36.0)
MCV: 84.7 fL (ref 80.0–100.0)
Monocytes Absolute: 0.9 10*3/uL (ref 0.1–1.0)
Monocytes Relative: 9 %
Neutro Abs: 7.5 10*3/uL (ref 1.7–7.7)
Neutrophils Relative %: 74 %
Platelets: 217 10*3/uL (ref 150–400)
RBC: 4.85 MIL/uL (ref 4.22–5.81)
RDW: 13.5 % (ref 11.5–15.5)
WBC: 10.2 10*3/uL (ref 4.0–10.5)
nRBC: 0 % (ref 0.0–0.2)

## 2023-08-08 LAB — BASIC METABOLIC PANEL
Anion gap: 15 (ref 5–15)
BUN: 21 mg/dL — ABNORMAL HIGH (ref 6–20)
CO2: 24 mmol/L (ref 22–32)
Calcium: 8.6 mg/dL — ABNORMAL LOW (ref 8.9–10.3)
Chloride: 91 mmol/L — ABNORMAL LOW (ref 98–111)
Creatinine, Ser: 1.29 mg/dL — ABNORMAL HIGH (ref 0.61–1.24)
GFR, Estimated: >60 mL/min (ref >60)
Glucose, Bld: 413 mg/dL — ABNORMAL HIGH (ref 70–99)
Potassium: 3.7 mmol/L (ref 3.5–5.1)
Sodium: 130 mmol/L — ABNORMAL LOW (ref 135–145)

## 2023-08-08 LAB — CBG MONITORING, ED: Glucose-Capillary: 300 mg/dL — ABNORMAL HIGH (ref 70–99)

## 2023-08-08 MED ORDER — SODIUM CHLORIDE 0.9 % IV BOLUS
1000.0000 mL | Freq: Once | INTRAVENOUS | Status: AC
  Administered 2023-08-08: 1000 mL via INTRAVENOUS

## 2023-08-08 MED ORDER — OXYCODONE-ACETAMINOPHEN 5-325 MG PO TABS: 1.0000 | ORAL_TABLET | Freq: Four times a day (QID) | ORAL | 0 refills | Status: DC | PRN

## 2023-08-08 MED ORDER — DOXYCYCLINE HYCLATE 100 MG PO TABS
100.0000 mg | ORAL_TABLET | Freq: Once | ORAL | Status: AC
  Administered 2023-08-08: 100 mg via ORAL
  Filled 2023-08-08: qty 1

## 2023-08-08 MED ORDER — HYDROMORPHONE HCL 1 MG/ML IJ SOLN
1.0000 mg | Freq: Once | INTRAMUSCULAR | Status: AC
Start: 2023-08-08 — End: 2023-08-08
  Administered 2023-08-08: 1 mg via INTRAVENOUS
  Filled 2023-08-08: qty 1

## 2023-08-08 MED ORDER — HYDROMORPHONE HCL 1 MG/ML IJ SOLN
0.5000 mg | Freq: Once | INTRAMUSCULAR | Status: AC
Start: 2023-08-08 — End: 2023-08-08
  Administered 2023-08-08: 0.5 mg via INTRAVENOUS
  Filled 2023-08-08: qty 1

## 2023-08-08 MED ORDER — DOXYCYCLINE HYCLATE 100 MG PO CAPS: 100.0000 mg | ORAL_CAPSULE | Freq: Two times a day (BID) | ORAL | 0 refills | Status: DC

## 2023-08-08 MED ORDER — INSULIN ASPART 100 UNIT/ML IJ SOLN
10.0000 [IU] | Freq: Once | INTRAMUSCULAR | Status: AC
  Administered 2023-08-08: 10 [IU] via INTRAVENOUS
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
## 2023-08-08 NOTE — ED Notes (Signed)
 Patient given two pillows to help relieve pressure from back.

## 2023-08-08 NOTE — ED Provider Notes (Signed)
 [facility] Provider Note MRN:  [id number]  Arrival date & time: 08/08/23     Chief Complaint   Back Pain   History of Present Illness   Luis [name] is a 53 y.o. year-old male presents to the ED with chief complaint of low back pain.  States that it has been going on for about 3 weeks.  Had been taking percocet while in rehab after having had a stroke a few weeks ago, but states that he doesn't have any pain medication at home.  States that he has been trying to tough it out.  He has tried using [employer] without any relief.  He denies numbness, weakness, or tingling.  Denies bowel or bladder incontinence.  He denies fevers.  Additionally, patient complains of some discharge from his belly button.  States that it is foul smelling.    Hx of diabetes.  Glucose with EMS was 550.  History provided by patient.   Review of Systems  Pertinent positive and negative review of systems noted in HPI.    Physical Exam   Vitals:   08/08/23 0446 08/08/23 0508  BP:  125/60  Pulse: 97 95  Resp: 19 (!) 24  Temp:    SpO2: 94% 98%    CONSTITUTIONAL:  uncomfortable-appearing, NAD NEURO:  Alert and oriented x 3, CN 3-12 grossly intact EYES:  eyes equal and reactive ENT/NECK:  Supple, no stridor  CARDIO:  normal rate, regular rhythm, appears well-perfused  PULM:  No respiratory distress,  GI/GU:  non-distended, there is some mild discharge from the naval, no evidence of abscess  MSK/SPINE:  No gross deformities, no edema, moves all extremities, normal plantar/dorsiflexion, normal great toe extension, lumbar paraspinal muscles TTP, no bony step off or deformity SKIN:  no rash, atraumatic   *Additional and/or pertinent findings included in MDM below  Diagnostic and Interventional Summary    EKG Interpretation Date/Time:    Ventricular Rate:    PR Interval:    QRS Duration:    QT Interval:    QTC Calculation:   R Axis:      Text  Interpretation:         Labs Reviewed  BASIC METABOLIC PANEL - Abnormal; Notable for the following components:      Result Value   Sodium 130 (*)    Chloride 91 (*)    Glucose, Bld 413 (*)    BUN 21 (*)    Creatinine, Ser 1.29 (*)    Calcium  8.6 (*)    All other components within normal limits  CBG MONITORING, ED - Abnormal; Notable for the following components:   Glucose-Capillary 300 (*)    All other components within normal limits  CBC WITH DIFFERENTIAL/PLATELET    No orders to display    Medications  HYDROmorphone  (DILAUDID ) injection 1 mg (1 mg Intravenous Given 08/08/23 0504)  sodium chloride  0.9 % bolus 1,000 mL (1,000 mLs Intravenous New Bag/Given 08/08/23 0600)  insulin  aspart (novoLOG ) injection 10 Units (10 Units Intravenous Given 08/08/23 0557)  HYDROmorphone  (DILAUDID ) injection 0.5 mg (0.5 mg Intravenous Given 08/08/23 0555)  doxycycline  (VIBRA -TABS) tablet 100 mg (100 mg Oral Given 08/08/23 0555)     Procedures  /  Critical Care Procedures  ED Course and Medical Decision Making  I have reviewed the triage vital signs, the nursing notes, and pertinent available records from the EMR.  Social Determinants Affecting Complexity of Care: Patient has no clinically significant social determinants affecting this chief  complaint..   ED Course:    Medical Decision Making Patient here with low back pain.  This is not a new problem.  He states that his pain worsened tonight and he didn't have anything to take for it, so he came to the ER.  When he was admitted to the hospital at the end of December for stroke, he had been taking Vicodin, but patient states that he has run out of this and hasn't been able to see his doctor yet.  He states that he has an appointment on 2/12.    Chart review looks like he has had back pain before.  He has had x-rays and CT scans that show degenerative changes.     I don't think that he needs any advanced imaging today.  He has good motor control  of lower extremities.  He doesn't have any bowel or bladder incontinence.  I have a low suspicion for cauda equina.  I've treated his pain with IV dilaudid , which he states has helped.  He looks a bit dry in looking at his BMP.  His glucose is up a bit.  I'll give him a liter of fluid and some insulin  to help bring down his sugar.  Will plan for discharge and outpatient follow-up.  He has close follow-up with his doctor at the [facility] this week.    6:55 AM Patient reassessed.  His pain is improved after treatment.    Amount and/or Complexity of Data Reviewed Labs: ordered.  Risk Prescription drug management.         Consultants: No consultations were needed in caring for this patient.   Treatment and Plan: Emergency department workup does not suggest an emergent condition requiring admission or immediate intervention beyond  what has been performed at this time. The patient is safe for discharge and has  been instructed to return immediately for worsening symptoms, change in  symptoms or any other concerns    Final Clinical Impressions(s) / ED Diagnoses     ICD-10-CM   1. Acute low back pain, unspecified back pain laterality, unspecified whether sciatica present  M54.50       ED Discharge Orders          Ordered    oxyCODONE -acetaminophen  (PERCOCET) 5-325 MG tablet  Every 6 hours PRN        08/08/23 0545    doxycycline  (VIBRAMYCIN ) 100 MG capsule  2 times daily        08/08/23 0545              Discharge Instructions Discussed with and Provided to Patient:   Discharge Instructions   None      Vicky Charleston, PA-C 08/08/23 9342    Trine Raynell Moder, MD 08/13/23 [phone]

## 2023-09-07 ENCOUNTER — Other Ambulatory Visit (HOSPITAL_COMMUNITY): Payer: Self-pay

## 2023-09-09 ENCOUNTER — Other Ambulatory Visit (HOSPITAL_COMMUNITY): Payer: Self-pay

## 2023-09-15 ENCOUNTER — Emergency Department (HOSPITAL_COMMUNITY)

## 2023-09-15 ENCOUNTER — Emergency Department (HOSPITAL_COMMUNITY)
Admission: EM | Admit: 2023-09-15 | Discharge: 2023-09-16 | Disposition: A | Discharge status: Other Acute Inpatient Hospital | Attending: Emergency Medicine | Admitting: Emergency Medicine

## 2023-09-15 DIAGNOSIS — Z91040 Latex allergy status: Secondary | ICD-10-CM | POA: Insufficient documentation

## 2023-09-15 DIAGNOSIS — I5022 Chronic systolic (congestive) heart failure: Secondary | ICD-10-CM | POA: Diagnosis not present

## 2023-09-15 DIAGNOSIS — Y92410 Unspecified street and highway as the place of occurrence of the external cause: Secondary | ICD-10-CM | POA: Insufficient documentation

## 2023-09-15 DIAGNOSIS — Z91010 Allergy to peanuts: Secondary | ICD-10-CM | POA: Diagnosis not present

## 2023-09-15 DIAGNOSIS — S0591XA Unspecified injury of right eye and orbit, initial encounter: Secondary | ICD-10-CM | POA: Diagnosis not present

## 2023-09-15 DIAGNOSIS — M25562 Pain in left knee: Secondary | ICD-10-CM | POA: Insufficient documentation

## 2023-09-15 DIAGNOSIS — Z79899 Other long term (current) drug therapy: Secondary | ICD-10-CM | POA: Insufficient documentation

## 2023-09-15 DIAGNOSIS — S0531XA Ocular laceration without prolapse or loss of intraocular tissue, right eye, initial encounter: Secondary | ICD-10-CM

## 2023-09-15 DIAGNOSIS — S0521XA Ocular laceration and rupture with prolapse or loss of intraocular tissue, right eye, initial encounter: Secondary | ICD-10-CM | POA: Insufficient documentation

## 2023-09-15 DIAGNOSIS — I110 Hypertensive heart disease with heart failure: Secondary | ICD-10-CM | POA: Insufficient documentation

## 2023-09-15 DIAGNOSIS — S0285XA Fracture of orbit, unspecified, initial encounter for closed fracture: Secondary | ICD-10-CM

## 2023-09-15 DIAGNOSIS — Z720 Tobacco use: Secondary | ICD-10-CM | POA: Insufficient documentation

## 2023-09-15 DIAGNOSIS — Z7902 Long term (current) use of antithrombotics/antiplatelets: Secondary | ICD-10-CM | POA: Diagnosis not present

## 2023-09-15 DIAGNOSIS — Z794 Long term (current) use of insulin: Secondary | ICD-10-CM | POA: Insufficient documentation

## 2023-09-15 DIAGNOSIS — H5711 Ocular pain, right eye: Secondary | ICD-10-CM | POA: Diagnosis present

## 2023-09-15 DIAGNOSIS — E119 Type 2 diabetes mellitus without complications: Secondary | ICD-10-CM | POA: Diagnosis not present

## 2023-09-15 DIAGNOSIS — R600 Localized edema: Secondary | ICD-10-CM | POA: Diagnosis not present

## 2023-09-15 DIAGNOSIS — S0231XA Fracture of orbital floor, right side, initial encounter for closed fracture: Secondary | ICD-10-CM | POA: Insufficient documentation

## 2023-09-15 DIAGNOSIS — S0231XB Fracture of orbital floor, right side, initial encounter for open fracture: Secondary | ICD-10-CM | POA: Diagnosis not present

## 2023-09-15 DIAGNOSIS — S0011XA Contusion of right eyelid and periocular area, initial encounter: Secondary | ICD-10-CM | POA: Diagnosis not present

## 2023-09-15 LAB — CBC WITH DIFFERENTIAL/PLATELET
Abs Immature Granulocytes: 0.03 10*3/uL (ref 0.00–0.07)
Basophils Absolute: 0.1 10*3/uL (ref 0.0–0.1)
Basophils Relative: 1 %
Eosinophils Absolute: 0.1 10*3/uL (ref 0.0–0.5)
Eosinophils Relative: 1 %
HCT: 41.1 % (ref 39.0–52.0)
Hemoglobin: 13.2 g/dL (ref 13.0–17.0)
Immature Granulocytes: 0 %
Lymphocytes Relative: 25 %
Lymphs Abs: 2.6 10*3/uL (ref 0.7–4.0)
MCH: 28.4 pg (ref 26.0–34.0)
MCHC: 32.1 g/dL (ref 30.0–36.0)
MCV: 88.4 fL (ref 80.0–100.0)
Monocytes Absolute: 0.8 10*3/uL (ref 0.1–1.0)
Monocytes Relative: 8 %
Neutro Abs: 6.5 10*3/uL (ref 1.7–7.7)
Neutrophils Relative %: 65 %
Platelets: 215 10*3/uL (ref 150–400)
RBC: 4.65 MIL/uL (ref 4.22–5.81)
RDW: 14.8 % (ref 11.5–15.5)
WBC: 10.1 10*3/uL (ref 4.0–10.5)
nRBC: 0 % (ref 0.0–0.2)

## 2023-09-15 LAB — I-STAT CHEM 8, ED
BUN: 9 mg/dL (ref 6–20)
Calcium, Ion: 1.04 mmol/L — ABNORMAL LOW (ref 1.15–1.40)
Chloride: 102 mmol/L (ref 98–111)
Creatinine, Ser: 0.8 mg/dL (ref 0.61–1.24)
Glucose, Bld: 324 mg/dL — ABNORMAL HIGH (ref 70–99)
HCT: 42 % (ref 39.0–52.0)
Hemoglobin: 14.3 g/dL (ref 13.0–17.0)
Potassium: 3.7 mmol/L (ref 3.5–5.1)
Sodium: 135 mmol/L (ref 135–145)
TCO2: 24 mmol/L (ref 22–32)

## 2023-09-15 LAB — I-STAT CG4 LACTIC ACID, ED: Lactic Acid, Venous: 2.3 mmol/L (ref 0.5–1.9)

## 2023-09-15 LAB — PROTIME-INR
INR: 1 (ref 0.8–1.2)
Prothrombin Time: 13.2 s (ref 11.4–15.2)

## 2023-09-15 MED ORDER — HYDROMORPHONE HCL 1 MG/ML IJ SOLN
0.5000 mg | Freq: Once | INTRAMUSCULAR | Status: AC
  Administered 2023-09-15: 0.5 mg via INTRAVENOUS
  Filled 2023-09-15: qty 1

## 2023-09-15 MED ORDER — TETRACAINE HCL 0.5 % OP SOLN
1.0000 [drp] | Freq: Once | OPHTHALMIC | Status: AC
  Administered 2023-09-15: 1 [drp] via OPHTHALMIC
  Filled 2023-09-15: qty 4

## 2023-09-15 MED ORDER — FLUORESCEIN SODIUM 1 MG OP STRP
1.0000 | ORAL_STRIP | Freq: Once | OPHTHALMIC | Status: AC
  Administered 2023-09-15: 1 via OPHTHALMIC
  Filled 2023-09-15: qty 1
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
## 2023-09-15 NOTE — ED Notes (Signed)
 Ophthalmology at bedside
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
## 2023-09-16 ENCOUNTER — Other Ambulatory Visit (HOSPITAL_COMMUNITY): Payer: Self-pay

## 2023-09-16 DIAGNOSIS — M25562 Pain in left knee: Secondary | ICD-10-CM | POA: Diagnosis not present

## 2023-09-16 DIAGNOSIS — Y92410 Unspecified street and highway as the place of occurrence of the external cause: Secondary | ICD-10-CM | POA: Diagnosis not present

## 2023-09-16 DIAGNOSIS — Z794 Long term (current) use of insulin: Secondary | ICD-10-CM | POA: Diagnosis not present

## 2023-09-16 DIAGNOSIS — S0231XA Fracture of orbital floor, right side, initial encounter for closed fracture: Secondary | ICD-10-CM | POA: Diagnosis not present

## 2023-09-16 DIAGNOSIS — Z91010 Allergy to peanuts: Secondary | ICD-10-CM | POA: Diagnosis not present

## 2023-09-16 DIAGNOSIS — Z79899 Other long term (current) drug therapy: Secondary | ICD-10-CM | POA: Diagnosis not present

## 2023-09-16 DIAGNOSIS — S0521XA Ocular laceration and rupture with prolapse or loss of intraocular tissue, right eye, initial encounter: Secondary | ICD-10-CM | POA: Diagnosis not present

## 2023-09-16 DIAGNOSIS — R600 Localized edema: Secondary | ICD-10-CM | POA: Diagnosis not present

## 2023-09-16 DIAGNOSIS — Z720 Tobacco use: Secondary | ICD-10-CM | POA: Diagnosis not present

## 2023-09-16 DIAGNOSIS — I110 Hypertensive heart disease with heart failure: Secondary | ICD-10-CM | POA: Diagnosis not present

## 2023-09-16 DIAGNOSIS — E119 Type 2 diabetes mellitus without complications: Secondary | ICD-10-CM | POA: Diagnosis not present

## 2023-09-16 DIAGNOSIS — Z7902 Long term (current) use of antithrombotics/antiplatelets: Secondary | ICD-10-CM | POA: Diagnosis not present

## 2023-09-16 DIAGNOSIS — Z91040 Latex allergy status: Secondary | ICD-10-CM | POA: Diagnosis not present

## 2023-09-16 DIAGNOSIS — H5711 Ocular pain, right eye: Secondary | ICD-10-CM | POA: Diagnosis present

## 2023-09-16 DIAGNOSIS — I5022 Chronic systolic (congestive) heart failure: Secondary | ICD-10-CM | POA: Diagnosis not present

## 2023-09-16 MED ORDER — FENTANYL CITRATE PF 50 MCG/ML IJ SOSY: 50.0000 ug | PREFILLED_SYRINGE | Freq: Once | INTRAMUSCULAR | Status: DC

## 2023-09-16 NOTE — Progress Notes (Signed)
 Orthopedic Tech Progress Note Patient Details:  Luis [name] 10-31-1970 [id number]  Patient ID: Luis [name], male   DOB: 1970/12/01, 53 y.o.   MRN: [id number] I attended trauma page. Trinna Post 09/16/2023, 7:08 AM

## 2023-09-16 NOTE — ED Notes (Signed)
 Please give a call to fiance [phone] Antrenall Snips

## 2023-09-16 NOTE — ED Notes (Signed)
 Patient SpO2 dropping while patient sleep. Pt reports he usually uses CPAP machine to sleep. Place on supplemental O2.

## 2023-09-20 ENCOUNTER — Other Ambulatory Visit: Payer: Self-pay

## 2023-09-20 ENCOUNTER — Emergency Department (HOSPITAL_COMMUNITY)
Admission: EM | Admit: 2023-09-20 | Discharge: 2023-09-21 | Disposition: A | Discharge status: Home / Self Care | Attending: Emergency Medicine | Admitting: Emergency Medicine

## 2023-09-20 DIAGNOSIS — Z91040 Latex allergy status: Secondary | ICD-10-CM | POA: Diagnosis not present

## 2023-09-20 DIAGNOSIS — Z79899 Other long term (current) drug therapy: Secondary | ICD-10-CM | POA: Insufficient documentation

## 2023-09-20 DIAGNOSIS — Z91010 Allergy to peanuts: Secondary | ICD-10-CM | POA: Diagnosis not present

## 2023-09-20 DIAGNOSIS — S0531XD Ocular laceration without prolapse or loss of intraocular tissue, right eye, subsequent encounter: Secondary | ICD-10-CM | POA: Diagnosis not present

## 2023-09-20 DIAGNOSIS — H5711 Ocular pain, right eye: Secondary | ICD-10-CM | POA: Insufficient documentation

## 2023-09-20 DIAGNOSIS — Z794 Long term (current) use of insulin: Secondary | ICD-10-CM | POA: Diagnosis not present
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
## 2023-09-21 ENCOUNTER — Other Ambulatory Visit: Payer: Self-pay

## 2023-09-21 ENCOUNTER — Encounter (HOSPITAL_COMMUNITY): Payer: Self-pay | Admitting: Emergency Medicine

## 2023-09-21 MED ORDER — OXYCODONE-ACETAMINOPHEN 5-325 MG PO TABS: 1.0000 | ORAL_TABLET | Freq: Three times a day (TID) | ORAL | 0 refills | Status: DC | PRN

## 2023-09-21 MED ORDER — HYDROCODONE-ACETAMINOPHEN 5-325 MG PO TABS
1.0000 | ORAL_TABLET | Freq: Once | ORAL | Status: AC
  Administered 2023-09-21: 1 via ORAL
  Filled 2023-09-21: qty 1
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
## 2023-09-21 NOTE — Discharge Instructions (Signed)
 Please continue to use your eyedrops to help prevent infection.  Pain medicine has been prescribed.  Please follow-up with your eye specialist for further surgical options

## 2023-09-29 ENCOUNTER — Ambulatory Visit: Payer: Medicare HMO | Admitting: Neurology

## 2023-09-29 ENCOUNTER — Encounter: Payer: Self-pay | Admitting: Neurology

## 2023-09-29 VITALS — BP 140/75 | HR 90 | Ht 63.0 in | Wt 277.0 lb

## 2023-09-29 DIAGNOSIS — G4733 Obstructive sleep apnea (adult) (pediatric): Secondary | ICD-10-CM | POA: Diagnosis not present

## 2023-09-29 DIAGNOSIS — R2990 Unspecified symptoms and signs involving the nervous system: Secondary | ICD-10-CM

## 2023-09-29 NOTE — Patient Instructions (Signed)
 I had a long d/w patient about his recent strokelike episode, risk for recurrent stroke/TIAs, personally independently reviewed imaging studies and stroke evaluation results and answered questions.Continue Plavix 75 mg daily   for secondary stroke prevention and maintain strict control of hypertension with blood pressure goal below 130/90, diabetes with hemoglobin A1c goal below 6.5% and lipids with LDL cholesterol goal below 70 mg/dL. I also advised the patient to eat a healthy diet with plenty of whole grains, cereals, fruits and vegetables, exercise regularly and maintain ideal body weight I counseled him to use his CPAP every night for treatment for his obstructive sleep apnea.  Followup in the future with me only if needed and no scheduled appointment was made.
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
## 2023-11-02 ENCOUNTER — Emergency Department (HOSPITAL_COMMUNITY): Admission: EM | Admit: 2023-11-02 | Discharge: 2023-11-02 | Discharge status: Against Medical Advice

## 2023-11-02 NOTE — ED Notes (Signed)
 Pt called 3x for triage no response

## 2023-11-03 ENCOUNTER — Other Ambulatory Visit (HOSPITAL_COMMUNITY): Payer: Self-pay

## 2023-11-10 ENCOUNTER — Encounter (HOSPITAL_COMMUNITY): Payer: Self-pay | Admitting: *Deleted

## 2023-11-10 ENCOUNTER — Other Ambulatory Visit: Payer: Self-pay

## 2023-11-10 ENCOUNTER — Emergency Department (HOSPITAL_COMMUNITY)
Admission: EM | Admit: 2023-11-10 | Discharge: 2023-11-11 | Disposition: A | Discharge status: Home / Self Care | Attending: Emergency Medicine | Admitting: Emergency Medicine

## 2023-11-10 DIAGNOSIS — N492 Inflammatory disorders of scrotum: Secondary | ICD-10-CM | POA: Diagnosis not present

## 2023-11-10 DIAGNOSIS — I509 Heart failure, unspecified: Secondary | ICD-10-CM | POA: Diagnosis not present

## 2023-11-10 DIAGNOSIS — H5711 Ocular pain, right eye: Secondary | ICD-10-CM | POA: Diagnosis present

## 2023-11-10 DIAGNOSIS — Z79899 Other long term (current) drug therapy: Secondary | ICD-10-CM | POA: Diagnosis not present

## 2023-11-10 DIAGNOSIS — Z91010 Allergy to peanuts: Secondary | ICD-10-CM | POA: Diagnosis not present

## 2023-11-10 DIAGNOSIS — I110 Hypertensive heart disease with heart failure: Secondary | ICD-10-CM | POA: Insufficient documentation

## 2023-11-10 DIAGNOSIS — R000 Tachycardia, unspecified: Secondary | ICD-10-CM | POA: Insufficient documentation

## 2023-11-10 DIAGNOSIS — Z7902 Long term (current) use of antithrombotics/antiplatelets: Secondary | ICD-10-CM | POA: Insufficient documentation

## 2023-11-10 DIAGNOSIS — Z91040 Latex allergy status: Secondary | ICD-10-CM | POA: Diagnosis not present

## 2023-11-10 DIAGNOSIS — Z794 Long term (current) use of insulin: Secondary | ICD-10-CM | POA: Diagnosis not present

## 2023-11-10 DIAGNOSIS — Z9889 Other specified postprocedural states: Secondary | ICD-10-CM | POA: Diagnosis not present

## 2023-11-10 DIAGNOSIS — E1165 Type 2 diabetes mellitus with hyperglycemia: Secondary | ICD-10-CM | POA: Diagnosis not present

## 2023-11-10 LAB — CBC WITH DIFFERENTIAL/PLATELET
Abs Immature Granulocytes: 0.03 10*3/uL (ref 0.00–0.07)
Basophils Absolute: 0 10*3/uL (ref 0.0–0.1)
Basophils Relative: 0 %
Eosinophils Absolute: 0 10*3/uL (ref 0.0–0.5)
Eosinophils Relative: 0 %
HCT: 43.5 % (ref 39.0–52.0)
Hemoglobin: 14.3 g/dL (ref 13.0–17.0)
Immature Granulocytes: 0 %
Lymphocytes Relative: 24 %
Lymphs Abs: 2.3 10*3/uL (ref 0.7–4.0)
MCH: 29.1 pg (ref 26.0–34.0)
MCHC: 32.9 g/dL (ref 30.0–36.0)
MCV: 88.6 fL (ref 80.0–100.0)
Monocytes Absolute: 0.6 10*3/uL (ref 0.1–1.0)
Monocytes Relative: 7 %
Neutro Abs: 6.3 10*3/uL (ref 1.7–7.7)
Neutrophils Relative %: 69 %
Platelets: 228 10*3/uL (ref 150–400)
RBC: 4.91 MIL/uL (ref 4.22–5.81)
RDW: 13.2 % (ref 11.5–15.5)
WBC: 9.3 10*3/uL (ref 4.0–10.5)
nRBC: 0 % (ref 0.0–0.2)

## 2023-11-10 LAB — BASIC METABOLIC PANEL WITH GFR
Anion gap: 5 (ref 5–15)
BUN: 9 mg/dL (ref 6–20)
CO2: 27 mmol/L (ref 22–32)
Calcium: 8.6 mg/dL — ABNORMAL LOW (ref 8.9–10.3)
Chloride: 104 mmol/L (ref 98–111)
Creatinine, Ser: 0.9 mg/dL (ref 0.61–1.24)
GFR, Estimated: >60 mL/min (ref >60)
Glucose, Bld: 375 mg/dL — ABNORMAL HIGH (ref 70–99)
Potassium: 4 mmol/L (ref 3.5–5.1)
Sodium: 136 mmol/L (ref 135–145)

## 2023-11-10 LAB — I-STAT CG4 LACTIC ACID, ED
Lactic Acid, Venous: 2.7 mmol/L (ref 0.5–1.9)
Lactic Acid, Venous: 1.7 mmol/L (ref 0.5–1.9)

## 2023-11-10 MED ORDER — HYDROCODONE-ACETAMINOPHEN 5-325 MG PO TABS
1.0000 | ORAL_TABLET | Freq: Once | ORAL | Status: AC
  Administered 2023-11-10: 1 via ORAL
  Filled 2023-11-10: qty 1

## 2023-11-10 MED ORDER — HYDROCODONE-ACETAMINOPHEN 5-325 MG PO TABS
1.0000 | ORAL_TABLET | Freq: Once | ORAL | Status: AC
  Administered 2023-11-11: 1 via ORAL
  Filled 2023-11-10: qty 1

## 2023-11-10 MED ORDER — HYDROCODONE-ACETAMINOPHEN 5-325 MG PO TABS: 1.0000 | ORAL_TABLET | ORAL | 0 refills | Status: DC | PRN

## 2023-11-10 MED ORDER — SODIUM CHLORIDE 0.9 % IV BOLUS
1000.0000 mL | Freq: Once | INTRAVENOUS | Status: AC
  Administered 2023-11-10: 1000 mL via INTRAVENOUS

## 2023-11-10 MED ORDER — DOXYCYCLINE HYCLATE 100 MG PO TABS
100.0000 mg | ORAL_TABLET | Freq: Once | ORAL | Status: AC
  Administered 2023-11-10: 100 mg via ORAL
  Filled 2023-11-10: qty 1

## 2023-11-10 MED ORDER — DOXYCYCLINE HYCLATE 100 MG PO CAPS: 100.0000 mg | ORAL_CAPSULE | Freq: Two times a day (BID) | ORAL | 0 refills | Status: DC
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
## 2023-11-10 NOTE — Discharge Instructions (Signed)
 Your pain medication and antibiotic were sent to the pharmacy.  Make sure you call the eye doctor so they can provide more definitive care for your eye.  Hopefully she will be back from vacation soon.

## 2023-11-10 NOTE — ED Notes (Signed)
 [facility]us is not 999

## 2023-11-11 LAB — URINALYSIS, ROUTINE W REFLEX MICROSCOPIC
Bacteria, UA: NONE SEEN
Bilirubin Urine: NEGATIVE
Glucose, UA: >=500 mg/dL — AB
Ketones, ur: NEGATIVE mg/dL
Leukocytes,Ua: NEGATIVE
Nitrite: NEGATIVE
Protein, ur: 100 mg/dL — AB
Specific Gravity, Urine: 1.032 — ABNORMAL HIGH (ref 1.005–1.030)
pH: 5 (ref 5.0–8.0)

## 2023-11-29 ENCOUNTER — Ambulatory Visit: Attending: Cardiovascular Disease | Admitting: Cardiovascular Disease

## 2023-11-30 ENCOUNTER — Encounter: Payer: Self-pay | Admitting: Cardiovascular Disease

## 2023-12-06 NOTE — ED Notes (Signed)
 Patient states, I am getting the fuck out of here, I have to pee, I am cold. This is terrible service, this why I go to the [facility]. This RN offered patient blankets and offered for patient to speak with doctor. Patient stated, What the fuck is he going to do, I told him about this rash on my face and it is still hurting. This RN asked patient how he was going to get home and he stated, I will walk all the way back to [facility] if I need to. This RN asked to remove patients IV if he plans on leaving the emergency department, he stated,  I will worry about that.

## 2023-12-06 NOTE — ED Provider Notes (Signed)
 Patient seen after prior EDP.  Patient is minimally interested in interacting with the provider.  He continues to want to take a nap.  With persistent stimulation the patient is awake.  He is ambulatory.  He is neurologically intact.  He is fairly disagreeable.  He wants me to fucking leave him alone.  I advised the patient that he had no discrete findings on workup that suggest a need for admission.  He is advised that he will need to find a ride home.  He responded with several statements about how the [facility] emergency department was not taking care of him.  Patient is advised to closely follow-up with his PCP.  Patient is advised to avoid cocaine use in the future.  Importance of close follow-up stressed.  Strict return precautions given understood.   Maude Johnetta Galloway, MD 12/06/23 [phone]
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
## 2024-01-04 ENCOUNTER — Other Ambulatory Visit: Payer: Self-pay

## 2024-01-04 ENCOUNTER — Inpatient Hospital Stay (HOSPITAL_COMMUNITY)

## 2024-01-04 ENCOUNTER — Encounter (HOSPITAL_COMMUNITY): Payer: Self-pay

## 2024-01-04 ENCOUNTER — Inpatient Hospital Stay (HOSPITAL_COMMUNITY)
Admission: EM | Admit: 2024-01-04 | Discharge: 2024-01-10 | DRG: 208 | Disposition: A | Discharge status: Home Health | Attending: Family Medicine | Admitting: Family Medicine

## 2024-01-04 ENCOUNTER — Emergency Department (HOSPITAL_COMMUNITY)

## 2024-01-04 DIAGNOSIS — R001 Bradycardia, unspecified: Secondary | ICD-10-CM | POA: Diagnosis present

## 2024-01-04 DIAGNOSIS — Z808 Family history of malignant neoplasm of other organs or systems: Secondary | ICD-10-CM

## 2024-01-04 DIAGNOSIS — I5043 Acute on chronic combined systolic (congestive) and diastolic (congestive) heart failure: Secondary | ICD-10-CM | POA: Diagnosis present

## 2024-01-04 DIAGNOSIS — E440 Moderate protein-calorie malnutrition: Secondary | ICD-10-CM | POA: Diagnosis present

## 2024-01-04 DIAGNOSIS — F419 Anxiety disorder, unspecified: Secondary | ICD-10-CM | POA: Diagnosis present

## 2024-01-04 DIAGNOSIS — E11649 Type 2 diabetes mellitus with hypoglycemia without coma: Secondary | ICD-10-CM | POA: Diagnosis present

## 2024-01-04 DIAGNOSIS — Z79899 Other long term (current) drug therapy: Secondary | ICD-10-CM

## 2024-01-04 DIAGNOSIS — Z886 Allergy status to analgesic agent status: Secondary | ICD-10-CM

## 2024-01-04 DIAGNOSIS — K219 Gastro-esophageal reflux disease without esophagitis: Secondary | ICD-10-CM | POA: Diagnosis not present

## 2024-01-04 DIAGNOSIS — F17210 Nicotine dependence, cigarettes, uncomplicated: Secondary | ICD-10-CM | POA: Diagnosis present

## 2024-01-04 DIAGNOSIS — Z6841 Body Mass Index (BMI) 40.0 and over, adult: Secondary | ICD-10-CM | POA: Diagnosis not present

## 2024-01-04 DIAGNOSIS — F1410 Cocaine abuse, uncomplicated: Secondary | ICD-10-CM

## 2024-01-04 DIAGNOSIS — Z91148 Patient's other noncompliance with medication regimen for other reason: Secondary | ICD-10-CM

## 2024-01-04 DIAGNOSIS — J690 Pneumonitis due to inhalation of food and vomit: Secondary | ICD-10-CM | POA: Diagnosis present

## 2024-01-04 DIAGNOSIS — R570 Cardiogenic shock: Secondary | ICD-10-CM | POA: Diagnosis present

## 2024-01-04 DIAGNOSIS — Z888 Allergy status to other drugs, medicaments and biological substances status: Secondary | ICD-10-CM

## 2024-01-04 DIAGNOSIS — G4733 Obstructive sleep apnea (adult) (pediatric): Secondary | ICD-10-CM | POA: Diagnosis present

## 2024-01-04 DIAGNOSIS — I2722 Pulmonary hypertension due to left heart disease: Secondary | ICD-10-CM | POA: Diagnosis present

## 2024-01-04 DIAGNOSIS — Z8249 Family history of ischemic heart disease and other diseases of the circulatory system: Secondary | ICD-10-CM

## 2024-01-04 DIAGNOSIS — J9602 Acute respiratory failure with hypercapnia: Secondary | ICD-10-CM | POA: Diagnosis present

## 2024-01-04 DIAGNOSIS — I428 Other cardiomyopathies: Secondary | ICD-10-CM | POA: Diagnosis present

## 2024-01-04 DIAGNOSIS — E1165 Type 2 diabetes mellitus with hyperglycemia: Secondary | ICD-10-CM | POA: Diagnosis present

## 2024-01-04 DIAGNOSIS — J9601 Acute respiratory failure with hypoxia: Principal | ICD-10-CM | POA: Diagnosis present

## 2024-01-04 DIAGNOSIS — E785 Hyperlipidemia, unspecified: Secondary | ICD-10-CM | POA: Diagnosis present

## 2024-01-04 DIAGNOSIS — Z91010 Allergy to peanuts: Secondary | ICD-10-CM

## 2024-01-04 DIAGNOSIS — Z7984 Long term (current) use of oral hypoglycemic drugs: Secondary | ICD-10-CM

## 2024-01-04 DIAGNOSIS — I5020 Unspecified systolic (congestive) heart failure: Secondary | ICD-10-CM | POA: Diagnosis present

## 2024-01-04 DIAGNOSIS — J810 Acute pulmonary edema: Secondary | ICD-10-CM | POA: Diagnosis not present

## 2024-01-04 DIAGNOSIS — Z86718 Personal history of other venous thrombosis and embolism: Secondary | ICD-10-CM

## 2024-01-04 DIAGNOSIS — E8729 Other acidosis: Secondary | ICD-10-CM | POA: Diagnosis present

## 2024-01-04 DIAGNOSIS — I110 Hypertensive heart disease with heart failure: Secondary | ICD-10-CM | POA: Diagnosis present

## 2024-01-04 DIAGNOSIS — E66813 Obesity, class 3: Secondary | ICD-10-CM | POA: Diagnosis present

## 2024-01-04 DIAGNOSIS — Z91040 Latex allergy status: Secondary | ICD-10-CM

## 2024-01-04 DIAGNOSIS — Z801 Family history of malignant neoplasm of trachea, bronchus and lung: Secondary | ICD-10-CM

## 2024-01-04 DIAGNOSIS — I2510 Atherosclerotic heart disease of native coronary artery without angina pectoris: Secondary | ICD-10-CM | POA: Diagnosis present

## 2024-01-04 DIAGNOSIS — F4310 Post-traumatic stress disorder, unspecified: Secondary | ICD-10-CM | POA: Diagnosis present

## 2024-01-04 DIAGNOSIS — Z8673 Personal history of transient ischemic attack (TIA), and cerebral infarction without residual deficits: Secondary | ICD-10-CM

## 2024-01-04 DIAGNOSIS — M109 Gout, unspecified: Secondary | ICD-10-CM | POA: Diagnosis present

## 2024-01-04 DIAGNOSIS — I169 Hypertensive crisis, unspecified: Secondary | ICD-10-CM | POA: Diagnosis present

## 2024-01-04 DIAGNOSIS — Z5986 Financial insecurity: Secondary | ICD-10-CM

## 2024-01-04 DIAGNOSIS — Z880 Allergy status to penicillin: Secondary | ICD-10-CM

## 2024-01-04 DIAGNOSIS — Z95810 Presence of automatic (implantable) cardiac defibrillator: Secondary | ICD-10-CM

## 2024-01-04 DIAGNOSIS — B951 Streptococcus, group B, as the cause of diseases classified elsewhere: Secondary | ICD-10-CM | POA: Diagnosis present

## 2024-01-04 DIAGNOSIS — E1140 Type 2 diabetes mellitus with diabetic neuropathy, unspecified: Secondary | ICD-10-CM | POA: Diagnosis present

## 2024-01-04 DIAGNOSIS — Z794 Long term (current) use of insulin: Secondary | ICD-10-CM | POA: Diagnosis not present

## 2024-01-04 DIAGNOSIS — I509 Heart failure, unspecified: Secondary | ICD-10-CM

## 2024-01-04 DIAGNOSIS — I5021 Acute systolic (congestive) heart failure: Secondary | ICD-10-CM | POA: Diagnosis not present

## 2024-01-04 DIAGNOSIS — Z885 Allergy status to narcotic agent status: Secondary | ICD-10-CM

## 2024-01-04 DIAGNOSIS — Z9102 Food additives allergy status: Secondary | ICD-10-CM

## 2024-01-04 DIAGNOSIS — Z7902 Long term (current) use of antithrombotics/antiplatelets: Secondary | ICD-10-CM

## 2024-01-04 DIAGNOSIS — E119 Type 2 diabetes mellitus without complications: Secondary | ICD-10-CM | POA: Diagnosis not present

## 2024-01-04 DIAGNOSIS — G928 Other toxic encephalopathy: Secondary | ICD-10-CM | POA: Diagnosis present

## 2024-01-04 DIAGNOSIS — J9691 Respiratory failure, unspecified with hypoxia: Principal | ICD-10-CM | POA: Diagnosis present

## 2024-01-04 DIAGNOSIS — Z91041 Radiographic dye allergy status: Secondary | ICD-10-CM

## 2024-01-04 LAB — BLOOD GAS, VENOUS
Acid-Base Excess: 4 mmol/L — ABNORMAL HIGH (ref 0.0–2.0)
Bicarbonate: 32.5 mmol/L — ABNORMAL HIGH (ref 20.0–28.0)
Drawn by: 8634
O2 Saturation: 47.1 %
Patient temperature: 37.3
pCO2, Ven: 67 mmHg — ABNORMAL HIGH (ref 44–60)
pH, Ven: 7.3 (ref 7.25–7.43)
pO2, Ven: 31 mmHg — CL (ref 32–45)

## 2024-01-04 LAB — RAPID URINE DRUG SCREEN, HOSP PERFORMED
Amphetamines: NOT DETECTED
Barbiturates: NOT DETECTED
Benzodiazepines: NOT DETECTED
Cocaine: POSITIVE — AB
Opiates: NOT DETECTED
Tetrahydrocannabinol: NOT DETECTED

## 2024-01-04 LAB — BASIC METABOLIC PANEL WITH GFR
Anion gap: 12 (ref 5–15)
Anion gap: 11 (ref 5–15)
BUN: 8 mg/dL (ref 6–20)
BUN: 13 mg/dL (ref 6–20)
CO2: 25 mmol/L (ref 22–32)
CO2: 20 mmol/L — ABNORMAL LOW (ref 22–32)
Calcium: 9.1 mg/dL (ref 8.9–10.3)
Calcium: 8.1 mg/dL — ABNORMAL LOW (ref 8.9–10.3)
Chloride: 101 mmol/L (ref 98–111)
Chloride: 104 mmol/L (ref 98–111)
Creatinine, Ser: 0.79 mg/dL (ref 0.61–1.24)
Creatinine, Ser: 0.93 mg/dL (ref 0.61–1.24)
GFR, Estimated: >60 mL/min (ref >60)
GFR, Estimated: >60 mL/min (ref >60)
Glucose, Bld: 264 mg/dL — ABNORMAL HIGH (ref 70–99)
Glucose, Bld: 373 mg/dL — ABNORMAL HIGH (ref 70–99)
Potassium: 4 mmol/L (ref 3.5–5.1)
Potassium: 4 mmol/L (ref 3.5–5.1)
Sodium: 138 mmol/L (ref 135–145)
Sodium: 135 mmol/L (ref 135–145)

## 2024-01-04 LAB — I-STAT ARTERIAL BLOOD GAS, ED
Acid-base deficit: 4 mmol/L — ABNORMAL HIGH (ref 0.0–2.0)
Bicarbonate: 29.5 mmol/L — ABNORMAL HIGH (ref 20.0–28.0)
Calcium, Ion: 1.25 mmol/L (ref 1.15–1.40)
HCT: 45 % (ref 39.0–52.0)
Hemoglobin: 15.3 g/dL (ref 13.0–17.0)
O2 Saturation: 96 %
Patient temperature: 98.6
Potassium: 4 mmol/L (ref 3.5–5.1)
Sodium: 137 mmol/L (ref 135–145)
TCO2: 33 mmol/L — ABNORMAL HIGH (ref 22–32)
pCO2 arterial: 100.3 mmHg (ref 32–48)
pH, Arterial: 7.077 — CL (ref 7.35–7.45)
pO2, Arterial: 123 mmHg — ABNORMAL HIGH (ref 83–108)

## 2024-01-04 LAB — POCT I-STAT 7, (LYTES, BLD GAS, ICA,H+H)
Acid-Base Excess: 1 mmol/L (ref 0.0–2.0)
Acid-base deficit: 1 mmol/L (ref 0.0–2.0)
Acid-base deficit: 1 mmol/L (ref 0.0–2.0)
Bicarbonate: 29.3 mmol/L — ABNORMAL HIGH (ref 20.0–28.0)
Bicarbonate: 27.7 mmol/L (ref 20.0–28.0)
Bicarbonate: 27.6 mmol/L (ref 20.0–28.0)
Calcium, Ion: 1.23 mmol/L (ref 1.15–1.40)
Calcium, Ion: 1.23 mmol/L (ref 1.15–1.40)
Calcium, Ion: 1.2 mmol/L (ref 1.15–1.40)
HCT: 42 % (ref 39.0–52.0)
HCT: 40 % (ref 39.0–52.0)
HCT: 37 % — ABNORMAL LOW (ref 39.0–52.0)
Hemoglobin: 14.3 g/dL (ref 13.0–17.0)
Hemoglobin: 13.6 g/dL (ref 13.0–17.0)
Hemoglobin: 12.6 g/dL — ABNORMAL LOW (ref 13.0–17.0)
O2 Saturation: 96 %
O2 Saturation: 96 %
O2 Saturation: 93 %
Patient temperature: 38.1
Potassium: 4.6 mmol/L (ref 3.5–5.1)
Potassium: 4.4 mmol/L (ref 3.5–5.1)
Potassium: 4 mmol/L (ref 3.5–5.1)
Sodium: 137 mmol/L (ref 135–145)
Sodium: 138 mmol/L (ref 135–145)
Sodium: 136 mmol/L (ref 135–145)
TCO2: 31 mmol/L (ref 22–32)
TCO2: 30 mmol/L (ref 22–32)
TCO2: 29 mmol/L (ref 22–32)
pCO2 arterial: 72.2 mmHg (ref 32–48)
pCO2 arterial: 62.2 mmHg — ABNORMAL HIGH (ref 32–48)
pCO2 arterial: 52.5 mmHg — ABNORMAL HIGH (ref 32–48)
pH, Arterial: 7.216 — ABNORMAL LOW (ref 7.35–7.45)
pH, Arterial: 7.256 — ABNORMAL LOW (ref 7.35–7.45)
pH, Arterial: 7.333 — ABNORMAL LOW (ref 7.35–7.45)
pO2, Arterial: 100 mmHg (ref 83–108)
pO2, Arterial: 96 mmHg (ref 83–108)
pO2, Arterial: 78 mmHg — ABNORMAL LOW (ref 83–108)

## 2024-01-04 LAB — HEPATIC FUNCTION PANEL
ALT: 18 U/L (ref 0–44)
AST: 29 U/L (ref 15–41)
Albumin: 2.4 g/dL — ABNORMAL LOW (ref 3.5–5.0)
Alkaline Phosphatase: 57 U/L (ref 38–126)
Bilirubin, Direct: <0.1 mg/dL (ref 0.0–0.2)
Total Bilirubin: 0.4 mg/dL (ref 0.0–1.2)
Total Protein: 6 g/dL — ABNORMAL LOW (ref 6.5–8.1)

## 2024-01-04 LAB — HEMOGLOBIN A1C
Hgb A1c MFr Bld: 13.9 % — ABNORMAL HIGH (ref 4.8–5.6)
Mean Plasma Glucose: 352.23 mg/dL

## 2024-01-04 LAB — CBG MONITORING, ED: Glucose-Capillary: 138 mg/dL — ABNORMAL HIGH (ref 70–99)

## 2024-01-04 LAB — GLUCOSE, CAPILLARY
Glucose-Capillary: 327 mg/dL — ABNORMAL HIGH (ref 70–99)
Glucose-Capillary: 336 mg/dL — ABNORMAL HIGH (ref 70–99)
Glucose-Capillary: 349 mg/dL — ABNORMAL HIGH (ref 70–99)

## 2024-01-04 LAB — LACTIC ACID, PLASMA
Lactic Acid, Venous: 1.4 mmol/L (ref 0.5–1.9)
Lactic Acid, Venous: 1.9 mmol/L (ref 0.5–1.9)

## 2024-01-04 LAB — BRAIN NATRIURETIC PEPTIDE: B Natriuretic Peptide: 601.5 pg/mL — ABNORMAL HIGH (ref 0.0–100.0)

## 2024-01-04 LAB — I-STAT VENOUS BLOOD GAS, ED
Acid-Base Excess: 3 mmol/L — ABNORMAL HIGH (ref 0.0–2.0)
Bicarbonate: 29.1 mmol/L — ABNORMAL HIGH (ref 20.0–28.0)
Calcium, Ion: 1.15 mmol/L (ref 1.15–1.40)
HCT: 43 % (ref 39.0–52.0)
Hemoglobin: 14.6 g/dL (ref 13.0–17.0)
O2 Saturation: 92 %
Potassium: 4 mmol/L (ref 3.5–5.1)
Sodium: 138 mmol/L (ref 135–145)
TCO2: 31 mmol/L (ref 22–32)
pCO2, Ven: 50.1 mmHg (ref 44–60)
pH, Ven: 7.372 (ref 7.25–7.43)
pO2, Ven: 68 mmHg — ABNORMAL HIGH (ref 32–45)

## 2024-01-04 LAB — MRSA NEXT GEN BY PCR, NASAL: MRSA by PCR Next Gen: DETECTED — AB

## 2024-01-04 LAB — CBC
HCT: 43.8 % (ref 39.0–52.0)
Hemoglobin: 14 g/dL (ref 13.0–17.0)
MCH: 28.8 pg (ref 26.0–34.0)
MCHC: 32 g/dL (ref 30.0–36.0)
MCV: 90.1 fL (ref 80.0–100.0)
Platelets: 259 K/uL (ref 150–400)
RBC: 4.86 MIL/uL (ref 4.22–5.81)
RDW: 13.2 % (ref 11.5–15.5)
WBC: 10.4 K/uL (ref 4.0–10.5)
nRBC: 0 % (ref 0.0–0.2)

## 2024-01-04 LAB — MAGNESIUM: Magnesium: 1.6 mg/dL — ABNORMAL LOW (ref 1.7–2.4)

## 2024-01-04 LAB — TROPONIN I (HIGH SENSITIVITY)
Troponin I (High Sensitivity): 33 ng/L — ABNORMAL HIGH (ref <18)
Troponin I (High Sensitivity): 54 ng/L — ABNORMAL HIGH (ref <18)

## 2024-01-04 LAB — PROCALCITONIN: Procalcitonin: 0.13 ng/mL

## 2024-01-04 MED ORDER — FUROSEMIDE 10 MG/ML IJ SOLN
80.0000 mg | Freq: Two times a day (BID) | INTRAMUSCULAR | Status: DC
  Administered 2024-01-05 – 2024-01-09 (×9): 80 mg via INTRAVENOUS
  Filled 2024-01-04 (×9): qty 8

## 2024-01-04 MED ORDER — PROPOFOL 1000 MG/100ML IV EMUL
0.0000 ug/kg/min | INTRAVENOUS | Status: DC
  Administered 2024-01-04 (×2): 40 ug/kg/min via INTRAVENOUS
  Administered 2024-01-04: 15 ug/kg/min via INTRAVENOUS
  Administered 2024-01-05: 45 ug/kg/min via INTRAVENOUS
  Administered 2024-01-05: 30 ug/kg/min via INTRAVENOUS
  Administered 2024-01-05 – 2024-01-06 (×7): 40 ug/kg/min via INTRAVENOUS
  Administered 2024-01-06: 50 ug/kg/min via INTRAVENOUS
  Administered 2024-01-06: 45 ug/kg/min via INTRAVENOUS
  Filled 2024-01-04 (×14): qty 100

## 2024-01-04 MED ORDER — FENTANYL 2500MCG IN NS 250ML (10MCG/ML) PREMIX INFUSION
0.0000 ug/h | INTRAVENOUS | Status: DC
  Administered 2024-01-04: 50 ug/h via INTRAVENOUS
  Filled 2024-01-04 (×2): qty 250

## 2024-01-04 MED ORDER — MUPIROCIN 2 % EX OINT
1.0000 | TOPICAL_OINTMENT | Freq: Two times a day (BID) | CUTANEOUS | Status: AC
  Administered 2024-01-04 – 2024-01-09 (×9): 1 via NASAL
  Filled 2024-01-04 (×3): qty 22

## 2024-01-04 MED ORDER — ORAL CARE MOUTH RINSE
15.0000 mL | OROMUCOSAL | Status: DC
  Administered 2024-01-04 – 2024-01-06 (×26): 15 mL via OROMUCOSAL

## 2024-01-04 MED ORDER — ETOMIDATE 2 MG/ML IV SOLN
INTRAVENOUS | Status: AC | PRN
  Administered 2024-01-04: 20 mg via INTRAVENOUS

## 2024-01-04 MED ORDER — ENOXAPARIN SODIUM 60 MG/0.6ML IJ SOSY
60.0000 mg | PREFILLED_SYRINGE | INTRAMUSCULAR | Status: DC
  Administered 2024-01-04 – 2024-01-08 (×5): 60 mg via SUBCUTANEOUS
  Filled 2024-01-04 (×7): qty 0.6

## 2024-01-04 MED ORDER — POLYETHYLENE GLYCOL 3350 17 G PO PACK: 17.0000 g | PACK | Freq: Every day | ORAL | Status: DC | PRN

## 2024-01-04 MED ORDER — FUROSEMIDE 10 MG/ML IJ SOLN
80.0000 mg | Freq: Once | INTRAMUSCULAR | Status: AC
  Administered 2024-01-04: 80 mg via INTRAVENOUS
  Filled 2024-01-04: qty 8

## 2024-01-04 MED ORDER — PANTOPRAZOLE SODIUM 40 MG IV SOLR
40.0000 mg | Freq: Every day | INTRAVENOUS | Status: DC
  Administered 2024-01-04 – 2024-01-05 (×2): 40 mg via INTRAVENOUS
  Filled 2024-01-04 (×2): qty 10

## 2024-01-04 MED ORDER — FUROSEMIDE 10 MG/ML IJ SOLN
60.0000 mg | Freq: Once | INTRAMUSCULAR | Status: AC
  Administered 2024-01-04: 60 mg via INTRAVENOUS
  Filled 2024-01-04: qty 6

## 2024-01-04 MED ORDER — ENOXAPARIN SODIUM 40 MG/0.4ML IJ SOSY: 40.0000 mg | PREFILLED_SYRINGE | INTRAMUSCULAR | Status: DC

## 2024-01-04 MED ORDER — NITROGLYCERIN IN D5W 200-5 MCG/ML-% IV SOLN
0.0000 ug/min | INTRAVENOUS | Status: DC
  Administered 2024-01-04: 5 ug/min via INTRAVENOUS
  Filled 2024-01-04: qty 250

## 2024-01-04 MED ORDER — CHLORHEXIDINE GLUCONATE CLOTH 2 % EX PADS
6.0000 | MEDICATED_PAD | Freq: Every day | CUTANEOUS | Status: DC
  Administered 2024-01-05 – 2024-01-06 (×2): 6 via TOPICAL

## 2024-01-04 MED ORDER — DOCUSATE SODIUM 50 MG/5ML PO LIQD: 100.0000 mg | Freq: Two times a day (BID) | ORAL | Status: DC | PRN

## 2024-01-04 MED ORDER — POLYETHYLENE GLYCOL 3350 17 G PO PACK
17.0000 g | PACK | Freq: Every day | ORAL | Status: DC
  Administered 2024-01-05: 17 g
  Filled 2024-01-04: qty 1

## 2024-01-04 MED ORDER — LORAZEPAM 2 MG/ML IJ SOLN
0.5000 mg | Freq: Once | INTRAMUSCULAR | Status: AC
  Administered 2024-01-04: 0.5 mg via INTRAVENOUS
  Filled 2024-01-04: qty 1

## 2024-01-04 MED ORDER — ORAL CARE MOUTH RINSE: 15.0000 mL | OROMUCOSAL | Status: DC | PRN

## 2024-01-04 MED ORDER — SUCCINYLCHOLINE CHLORIDE 20 MG/ML IJ SOLN
INTRAMUSCULAR | Status: AC | PRN
  Administered 2024-01-04: 100 mg via INTRAVENOUS

## 2024-01-04 MED ORDER — INSULIN ASPART 100 UNIT/ML IJ SOLN
0.0000 [IU] | INTRAMUSCULAR | Status: DC
  Administered 2024-01-04 (×2): 11 [IU] via SUBCUTANEOUS
  Administered 2024-01-05 (×2): 3 [IU] via SUBCUTANEOUS
  Administered 2024-01-05: 5 [IU] via SUBCUTANEOUS
  Administered 2024-01-05: 8 [IU] via SUBCUTANEOUS
  Administered 2024-01-05: 2 [IU] via SUBCUTANEOUS
  Administered 2024-01-05: 11 [IU] via SUBCUTANEOUS
  Administered 2024-01-06 (×3): 5 [IU] via SUBCUTANEOUS
  Administered 2024-01-06: 3 [IU] via SUBCUTANEOUS

## 2024-01-04 MED ORDER — ROCURONIUM BROMIDE 10 MG/ML (PF) SYRINGE
50.0000 mg | PREFILLED_SYRINGE | Freq: Once | INTRAVENOUS | Status: AC
  Administered 2024-01-04: 50 mg via INTRAVENOUS
  Filled 2024-01-04: qty 10

## 2024-01-04 MED ORDER — FENTANYL CITRATE PF 50 MCG/ML IJ SOSY
25.0000 ug | PREFILLED_SYRINGE | Freq: Once | INTRAMUSCULAR | Status: DC
  Filled 2024-01-04: qty 1

## 2024-01-04 MED ORDER — FENTANYL BOLUS VIA INFUSION
25.0000 ug | INTRAVENOUS | Status: DC | PRN
  Administered 2024-01-04: 25 ug via INTRAVENOUS
  Administered 2024-01-04 (×2): 100 ug via INTRAVENOUS
  Administered 2024-01-05: 50 ug via INTRAVENOUS

## 2024-01-04 MED ORDER — DOCUSATE SODIUM 50 MG/5ML PO LIQD
100.0000 mg | Freq: Two times a day (BID) | ORAL | Status: DC
  Administered 2024-01-04 – 2024-01-05 (×3): 100 mg
  Filled 2024-01-04 (×3): qty 10

## 2024-01-04 NOTE — H&P (Signed)
 NAME:  Luis [name], MRN:  [id number], DOB:  1971/01/11, LOS: 0 ADMISSION DATE:  01/04/2024, CONSULTATION DATE:  01/04/2024 REFERRING MD:  Dr. Levander, EDP CHIEF COMPLAINT: respiratory failure    History of Present Illness:  53 year old male with past medical history of tobacco use, hypertension, hyperlipidemia, diabetes, cocaine use, systolic heart failure who presented to the emergency department today with shortness of breath.  Patient is intubated and therefore the history is obtained from chart review and EDP.  EMS reporting sudden onset shortness of breath and systolic pressures over 200.  He arrived to the emergency department with sats in the high 80s, diaphoretic and hypotensive and was placed on BiPAP which he initially tolerated.  He then had an episode of bradycardia and became unresponsive with sats into the low 80s and was intubated.  He was started on a nitroglycerin  drip.  Chest x-ray shows some pulmonary edema, cardiomegaly.  Labs notable for WBC 10.4, initial PCO2 50 with pH 7.3 that rapidly worsened to pCO2 100 and pH 7.07.  Cardiology was consulted and CCM consulted for admission.  Pertinent  Medical History  tobacco use, hypertension, hyperlipidemia, diabetes, cocaine use, systolic heart failure  Significant Hospital Events: Including procedures, antibiotic start and stop dates in addition to other pertinent events   7/8: Admit for acute HF, acute hypoxic and hypercapnic respiratory failure. Intubated in ED  Interim History / Subjective:  Arrived in ED with concern respiratory distress. Trialed Bipap but poor compliance. Pt developed bradycardia and new encephalopathy with concern of impending cardiac arrest. Pt intubated and thereafter stabilized. ACLS not needed.  Objective   Blood pressure (!) 200/108, pulse (!) 128, resp. rate (!) 25, weight 125.6 kg, SpO2 99%.    Vent Mode: BIPAP;PCV FiO2 (%):  [40 %] 40 % Set Rate:  [15 bmp] 15 bmp PEEP:  [5 cmH20] 5 cmH20  No  intake or output data in the 24 hours ending 01/04/24 1407 Filed Weights   01/04/24 1339  Weight: 125.6 kg   Examination: General: WDWN obese man in bed, sedated, intubated HENT: PERRLA, white frothy sputum from tube Lungs: Coarse bilaterally Cardiovascular: Tachycardia with regular rhythm, no MRG Abdomen: Obese, soft, no masses Extremities: 2+ edema to bilateral LEs. LEs are warm. Distal pulses intact. Neuro: Sedated. Does not respond to stimuli. GU: Foley in place  Resolved Hospital Problem list    Assessment & Plan:   Neuro Acute Metabolic Encephalopathy 2/2 Hypercarbia and Intubation/Sedation Hx CVA December 2024 Sedation with fentanyl  and propofol . Goal RASS - 3, anticipate -1 tomorrow. Goal CPOT < 2. Further treatment as below. - Prescribed statin and plavix , but no fill history on file.   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  With report of sudden onset shortness of breath, suspect flash pulmonary edema in the setting of decompensated heart failure - interval CXR in ED with worsening pulmonary edema. Had an episode of bradycardia, hypoxia and was subsequently intubated.  No evidence of pneumonia, pneumothorax.  - ABG stable at first assessment in ED but progression to pCO2 100 and pH 7.07 in setting of acute edema.  - Pt sedated and paralyzed for vent compliance to facilitate urgent ventilation of CO2. Will monitor with sequential ABGs, current minute ventilation 13. - With acute acidosis, concern for potassium shifts, so will collect BMP  - FU procalcitonin -  full mechanical vent support - lung protective ventilation 6-8cc/kg Vt - VAP and PAD bundle in place  - titrate FiO2 to sat goal >92  - maintain plats <30, driving pressures <84 as tolerated. 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 Report of cocaine use 3hrs prior to arrival in ED, although patient denied.  Blood pressures stabilized with nitroglycerin  gtt, since discontinued. He has received 60 IV lasix  by EDP. Pt will need further diuresis. Will trend troponin, collect ECHO to assess if any underlying ischemia. Blood pressures stable and patient warm, lactic WNL, so less concern for cardiogenic shock at present. Cardiology to see. - continue diuresis with IV lasix . 60 IV in ED. Will order 80 IV BID. Reassess in am. - Avoid BB in acute phase - Collect Echo - Trend troponin. - FU BNP - restart home crestor  when appropriate  - Prescribed GDMT, hx heart caths, does not appear to be taking these medicines - cardiology consulted, appreciate recs   Renal Renal function stable at admission. Already with good UOP from recent lasix  dose. Will trend UPO and Cr with ongoing diuresis. - Fu Mg, BMP. Goal Mg > 2 and K > 4.  Heme/Onc DVT prophylaxis with Lovenox . Pt is a Jehovah's Witness - blood product refusal.  Endocrine Hx of T2 Diabetes - SSI, A1c, q4 CBG   Infectious Do not suspect acute infection at this time. Have collected blood cultures and MRSA. Will hold abx for now. - FU procal  Best Practice (right click and Reselect all SmartList Selections daily)   Diet/type: NPO DVT prophylaxis: Enoxaparin  Pressure ulcer(s). None at admission GI prophylaxis: PPI Lines: N/A Foley:  Yes, and it is still needed Code Status:  full code Last date of multidisciplinary goals of care discussion [n/a]  Labs   CBC: Recent Labs  Lab 01/04/24 1308 01/04/24 1333  WBC 10.4  --   HGB 14.0 14.6  HCT 43.8 43.0  MCV 90.1  --   PLT 259  --     Basic Metabolic Panel: Recent  Labs  Lab 01/04/24 1333  NA 138  K 4.0   GFR: CrCl cannot be calculated (Patient's most recent lab result is older than the maximum 21 days allowed.). Recent Labs  Lab 01/04/24 1308  WBC 10.4    Liver Function Tests: No results for input(s): AST, ALT, ALKPHOS, BILITOT, PROT, ALBUMIN in the last 168 hours. No results for input(s): LIPASE, AMYLASE in the last 168 hours. No results for input(s): AMMONIA in the last 168 hours.  ABG    Component Value Date/Time   HCO3 29.1 (H) 01/04/2024 1333   TCO2 31 01/04/2024 1333   ACIDBASEDEF 1.0 04/16/2022 1356   O2SAT 92 01/04/2024 1333     Coagulation Profile: No results for input(s): INR, PROTIME in the last 168 hours.  Cardiac Enzymes: No results for input(s): CKTOTAL, CKMB, CKMBINDEX, TROPONINI in the last 168 hours.  HbA1C: Hgb A1c MFr Bld  Date/Time Value Ref Range Status  06/13/2023 04:43 AM 12.0 (H) 4.8 - 5.6 % Final    Comment:    (NOTE) Pre diabetes:          5.7%-6.4%  Diabetes:              >6.4%  Glycemic control for   <7.0% adults with diabetes   10/05/2022 11:50 AM  9.2 (H) 4.8 - 5.6 % Final    Comment:    (NOTE) Pre diabetes:          5.7%-6.4%  Diabetes:              >6.4%  Glycemic control for   <7.0% adults with diabetes     CBG: Recent Labs  Lab 01/04/24 1353  GLUCAP 138*    Review of Systems:   Negative per pt sedation.  Past Medical History:  He,  has a past medical history of Anxiety, Biventricular ICD (implantable cardioverter-defibrillator) in place, Chronic systolic CHF (congestive heart failure) (HCC), Cocaine use, Diabetes mellitus without complication (HCC), GERD (gastroesophageal reflux disease), HLD (hyperlipidemia), Hypertension, Morbid obesity (HCC), NICM (nonischemic cardiomyopathy) (HCC), OSA (obstructive sleep apnea), PTSD (post-traumatic stress disorder), Refusal of blood transfusions as patient is Jehovah's Witness, and Tobacco abuse.    Surgical History:   Past Surgical History:  Procedure Laterality Date   BIV ICD INSERTION CRT-D     FOOT FRACTURE SURGERY     ICD GENERATOR CHANGEOUT N/A 03/23/2022   Procedure: ICD GENERATOR CHANGEOUT;  Surgeon: Nancey, Eulas BRAVO, MD;  Location: [facility] INVASIVE CV LAB;  Service: Cardiovascular;  Laterality: N/A;   RIGHT HEART CATH N/A 03/06/2022   Procedure: RIGHT HEART CATH;  Surgeon: Wendel Lurena POUR, MD;  Location: [facility] INVASIVE CV LAB;  Service: Cardiovascular;  Laterality: N/A;   RIGHT/LEFT HEART CATH AND CORONARY ANGIOGRAPHY N/A 04/16/2022   Procedure: RIGHT/LEFT HEART CATH AND CORONARY ANGIOGRAPHY;  Surgeon: Rolan Ezra RAMAN, MD;  Location: [facility] INVASIVE CV LAB;  Service: Cardiovascular;  Laterality: N/A;   ROTATOR CUFF REPAIR     TONSILLECTOMY       Social History:   reports that he has been smoking cigarettes. He started smoking about 17 years ago. He has a 7.5 pack-year smoking history. He has never used smokeless tobacco. He reports that he does not currently use alcohol . He reports current drug use. Drugs: Cocaine and Marijuana.   Family History:  His family history includes Bone cancer in his father; Hypertension in his mother; Lung cancer in his father.   Allergies Allergies  Allergen Reactions   Aspirin Anaphylaxis and Other (See Comments)    Throat closing    Iodine-131 Hives   Peanut (Diagnostic) Anaphylaxis    Throat closes   Iodinated Contrast Media Hives    Can be premedicated    Latex Hives and Rash   Penicillins Nausea And Vomiting   Ultram  [Tramadol ] Hives and Nausea Only   Zestril [Lisinopril] Cough   Cinnamon    Amoxicillin-Pot Clavulanate Diarrhea   Lidocaine  Rash    Rash from adhesive on lidocaine  patches      Home Medications  Prior to Admission medications   Medication Sig Start Date End Date Taking? Authorizing Provider  acetaminophen  (TYLENOL ) 325 MG tablet Take 2 tablets (650 mg total) by mouth every 6 (six) hours as needed for mild pain.  10/26/22   Tharon Lung, MD  bumetanide  (BUMEX ) 2 MG tablet Take 1 tablet (2 mg total) by mouth 2 (two) times daily. 06/28/23   Angiulli, Toribio PARAS, PA-C  carvedilol  (COREG ) 3.125 MG tablet Take 1 tablet (3.125 mg total) by mouth 2 (two) times daily with a meal. 06/28/23   Angiulli, Toribio PARAS, PA-C  cholecalciferol (VITAMIN D3) 25 MCG (1000 UNIT) tablet Take 1,000 Units by mouth daily.    [provider]  clopidogrel  (PLAVIX ) 75 MG tablet Take 1 tablet (75 mg total) by mouth daily.  06/28/23   Angiulli, Toribio PARAS, PA-C  Continuous Glucose Sensor (DEXCOM G7 SENSOR) MISC Place 1 sensor onto the skin and change every 10 days 06/29/23   Kirsteins, Prentice BRAVO, MD  dapagliflozin  propanediol (FARXIGA ) 10 MG TABS tablet Take 1 tablet (10 mg total) by mouth daily. 06/28/23   Angiulli, Toribio PARAS, PA-C  diclofenac  Sodium (VOLTAREN ) 1 % GEL Apply 2 g topically 4 (four) times daily. 10/20/22   [provider]  digoxin  (LANOXIN ) 0.125 MG tablet Take 1 tablet (0.125 mg total) by mouth daily. 06/28/23   Angiulli, Toribio PARAS, PA-C  doxycycline  (VIBRAMYCIN ) 100 MG capsule Take 1 capsule (100 mg total) by mouth 2 (two) times daily. 11/10/23   Doretha Folks, MD  DULoxetine  (CYMBALTA ) 30 MG capsule Take 1 capsule (30 mg total) by mouth daily. 06/28/23   Angiulli, Toribio PARAS, PA-C  gabapentin  (NEURONTIN ) 300 MG capsule Take 1 capsule (300 mg total) by mouth 2 (two) times daily. 06/28/23   Angiulli, Toribio PARAS, PA-C  HYDROcodone -acetaminophen  (NORCO/VICODIN) 5-325 MG tablet Take 1 tablet by mouth every 4 (four) hours as needed. 11/10/23   Doretha Folks, MD  hydrOXYzine  (ATARAX ) 25 MG tablet Take 1 tablet (25 mg total) by mouth 3 (three) times daily as needed for anxiety. 09/03/21   Nwoko, Uchenna E, PA  hydrOXYzine  (VISTARIL ) 25 MG capsule Take 25 mg by mouth 3 (three) times daily. 06/29/23   [provider]  insulin  aspart (NOVOLOG  FLEXPEN) 100 UNIT/ML FlexPen Inject 15 Units into the skin 3 (three) times  daily with meals. 06/28/23   Angiulli, Toribio PARAS, PA-C  insulin  glargine (LANTUS ) 100 UNIT/ML Solostar Pen Inject 38 Units into the skin 2 (two) times daily. 06/28/23   Angiulli, Toribio PARAS, PA-C  Insulin  Pen Needle 32G X 4 MM MISC Use as directed up to four times daily 06/28/23   Angiulli, Toribio PARAS, PA-C  isoniazid  (NYDRAZID ) 300 MG tablet Take 1 tablet (300 mg total) by mouth daily. 06/28/23   Angiulli, Toribio PARAS, PA-C  loratadine  (CLARITIN ) 10 MG tablet Take 10 mg by mouth daily.    [provider]  mirtazapine (REMERON) 15 MG tablet Take 15 mg by mouth at bedtime. 06/29/23   [provider]  oxyCODONE -acetaminophen  (PERCOCET/ROXICET) 5-325 MG tablet Take 1 tablet by mouth every 8 (eight) hours as needed for severe pain (pain score 7-10). 09/21/23   Midge Golas, MD  pantoprazole  (PROTONIX ) 40 MG tablet Take 1 tablet (40 mg total) by mouth daily. 06/28/23   Angiulli, Toribio PARAS, PA-C  potassium chloride  SA (KLOR-CON  M) 20 MEQ tablet Take 1 tablet (20 mEq total) by mouth daily. 06/28/23   Angiulli, Toribio PARAS, PA-C  prazosin  (MINIPRESS ) 2 MG capsule Take 1 capsule (2 mg total) by mouth at bedtime. 06/28/23   Angiulli, Toribio PARAS, PA-C  pyridoxine  (B-6) 100 MG tablet Take 1 tablet (100 mg total) by mouth daily. 06/28/23   Angiulli, Toribio PARAS, PA-C  rosuvastatin  (CRESTOR ) 20 MG tablet Take 1 tablet (20 mg total) by mouth daily. 06/28/23   Angiulli, Toribio PARAS, PA-C  sacubitril -valsartan  (ENTRESTO ) 97-103 MG Take 1 tablet by mouth 2 (two) times daily. 06/28/23   Angiulli, Toribio PARAS, PA-C  spironolactone  (ALDACTONE ) 25 MG tablet Take 1 tablet (25 mg total) by mouth daily. 06/28/23   Angiulli, Toribio PARAS, PA-C  traZODone  (DESYREL ) 100 MG tablet Take 3 tablets (300 mg total) by mouth at bedtime. 06/28/23   Pegge Toribio PARAS, PA-C     Critical care time: 35 mins  Lonni Africa, DO IM Resident PGY-2

## 2024-01-04 NOTE — ED Notes (Signed)
 X-ray at bedside

## 2024-01-04 NOTE — ED Triage Notes (Signed)
 Pt BIB [facility] from home d/t resp distress. Pt has been feeling SOB the past few days & when he woke up prior to calling 911 he had very rapid breathing &  could not catch his breath, denies CP, A/Ox4. Caregiver at home reports that he used cocaine 3 hrs ago but pt denies & said it was 3 days ago. 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 Arrived to ED on NRB, respirations 30's bpm, pt very anxious.

## 2024-01-04 NOTE — Progress Notes (Signed)
 PHARMACY CONSULT: Lovenox  for VTE prophylaxis   Wt: 125 kg BMI:  46 Scr:  0.93 CrCl >30 ml/hr  H/H: 14.3/42 Pltc: 259  A/P:  Due to morbid obesity, begin weight-adjusted lovenox  0.5mg /kg sq q24h  Monitor CBC and renal function, adjust as needed   Rocky Slade, PharmD, BCPS 01/04/2024 5:44 PM

## 2024-01-04 NOTE — ED Notes (Signed)
 Dan RN & RT transported this pt to 3M07 for this RN. A call was made to ICU to reach Dgayli, MD by Rolan, RN to ask if a CT was needed on the way to ICU unit with no order given for one before leaving ED.

## 2024-01-04 NOTE — ED Notes (Signed)
 Critical care at bedside now & gave verbal order to stop nitroglycerin  gtt.

## 2024-01-04 NOTE — ED Notes (Signed)
 Dr.Ray pulled to room for neuro statis change noted & the pt bradying down.

## 2024-01-04 NOTE — Code Documentation (Signed)
 ET 8, 25 at the lip EDP Ray.

## 2024-01-04 NOTE — ED Notes (Signed)
 Xray dept called x2 by this RN for xray post intubation.

## 2024-01-04 NOTE — ED Provider Notes (Signed)
 [facility] AT [facility] Provider Note   CSN: [id number] Arrival date & time: 01/04/24  1303     Patient presents with: No chief complaint on file.   Luis [name] is a 53 y.o. male.  {Add pertinent medical, surgical, social history, OB history to HPI:3672} HPI 53 year old male history of CHF, recent cocaine use, presents today via EMS with sudden onset of dyspnea.  EMS reports blood pressures 200s systolically.  Patient coming from home.  Caregiver in the home.  Patient sats were 89% with significant respiratory distress upon their arrival.    Prior to Admission medications   Medication Sig Start Date End Date Taking? Authorizing Provider  acetaminophen  (TYLENOL ) 325 MG tablet Take 2 tablets (650 mg total) by mouth every 6 (six) hours as needed for mild pain. 10/26/22   Tharon Lung, MD  bumetanide  (BUMEX ) 2 MG tablet Take 1 tablet (2 mg total) by mouth 2 (two) times daily. 06/28/23   Angiulli, Toribio PARAS, PA-C  carvedilol  (COREG ) 3.125 MG tablet Take 1 tablet (3.125 mg total) by mouth 2 (two) times daily with a meal. 06/28/23   Angiulli, Toribio PARAS, PA-C  cholecalciferol (VITAMIN D3) 25 MCG (1000 UNIT) tablet Take 1,000 Units by mouth daily.    [provider]  clopidogrel  (PLAVIX ) 75 MG tablet Take 1 tablet (75 mg total) by mouth daily. 06/28/23   Angiulli, Toribio PARAS, PA-C  Continuous Glucose Sensor (DEXCOM G7 SENSOR) MISC Place 1 sensor onto the skin and change every 10 days 06/29/23   Kirsteins, Prentice BRAVO, MD  dapagliflozin  propanediol (FARXIGA ) 10 MG TABS tablet Take 1 tablet (10 mg total) by mouth daily. 06/28/23   Angiulli, Toribio PARAS, PA-C  diclofenac  Sodium (VOLTAREN ) 1 % GEL Apply 2 g topically 4 (four) times daily. 10/20/22   [provider]  digoxin  (LANOXIN ) 0.125 MG tablet Take 1 tablet (0.125 mg total) by mouth daily. 06/28/23   Angiulli, Toribio PARAS, PA-C  doxycycline  (VIBRAMYCIN ) 100 MG capsule Take 1 capsule (100 mg total)  by mouth 2 (two) times daily. 11/10/23   Doretha Folks, MD  DULoxetine  (CYMBALTA ) 30 MG capsule Take 1 capsule (30 mg total) by mouth daily. 06/28/23   Angiulli, Toribio PARAS, PA-C  gabapentin  (NEURONTIN ) 300 MG capsule Take 1 capsule (300 mg total) by mouth 2 (two) times daily. 06/28/23   Angiulli, Daniel J, PA-C  HYDROcodone -acetaminophen  (NORCO/VICODIN) 5-325 MG tablet Take 1 tablet by mouth every 4 (four) hours as needed. 11/10/23   Doretha Folks, MD  hydrOXYzine  (ATARAX ) 25 MG tablet Take 1 tablet (25 mg total) by mouth 3 (three) times daily as needed for anxiety. 09/03/21   Nwoko, Uchenna E, PA  hydrOXYzine  (VISTARIL ) 25 MG capsule Take 25 mg by mouth 3 (three) times daily. 06/29/23   [provider]  insulin  aspart (NOVOLOG  FLEXPEN) 100 UNIT/ML FlexPen Inject 15 Units into the skin 3 (three) times daily with meals. 06/28/23   Angiulli, Toribio PARAS, PA-C  insulin  glargine (LANTUS ) 100 UNIT/ML Solostar Pen Inject 38 Units into the skin 2 (two) times daily. 06/28/23   Angiulli, Toribio PARAS, PA-C  Insulin  Pen Needle 32G X 4 MM MISC Use as directed up to four times daily 06/28/23   Angiulli, Toribio PARAS, PA-C  isoniazid  (NYDRAZID ) 300 MG tablet Take 1 tablet (300 mg total) by mouth daily. 06/28/23   Angiulli, Toribio PARAS, PA-C  loratadine  (CLARITIN ) 10 MG tablet Take 10 mg by mouth daily.    [provider]  mirtazapine (  REMERON) 15 MG tablet Take 15 mg by mouth at bedtime. 06/29/23   [provider]  oxyCODONE -acetaminophen  (PERCOCET/ROXICET) 5-325 MG tablet Take 1 tablet by mouth every 8 (eight) hours as needed for severe pain (pain score 7-10). 09/21/23   Midge Golas, MD  pantoprazole  (PROTONIX ) 40 MG tablet Take 1 tablet (40 mg total) by mouth daily. 06/28/23   Angiulli, Toribio PARAS, PA-C  potassium chloride  SA (KLOR-CON  M) 20 MEQ tablet Take 1 tablet (20 mEq total) by mouth daily. 06/28/23   Angiulli, Toribio PARAS, PA-C  prazosin  (MINIPRESS ) 2 MG capsule Take 1 capsule (2 mg total)  by mouth at bedtime. 06/28/23   Angiulli, Toribio PARAS, PA-C  pyridoxine  (B-6) 100 MG tablet Take 1 tablet (100 mg total) by mouth daily. 06/28/23   Angiulli, Toribio PARAS, PA-C  rosuvastatin  (CRESTOR ) 20 MG tablet Take 1 tablet (20 mg total) by mouth daily. 06/28/23   Angiulli, Toribio PARAS, PA-C  sacubitril -valsartan  (ENTRESTO ) 97-103 MG Take 1 tablet by mouth 2 (two) times daily. 06/28/23   Angiulli, Toribio PARAS, PA-C  spironolactone  (ALDACTONE ) 25 MG tablet Take 1 tablet (25 mg total) by mouth daily. 06/28/23   Angiulli, Toribio PARAS, PA-C  traZODone  (DESYREL ) 100 MG tablet Take 3 tablets (300 mg total) by mouth at bedtime. 06/28/23   Angiulli, Toribio PARAS, PA-C    Allergies: Aspirin, Iodine-131, Peanut (diagnostic), Iodinated contrast media, Latex, Penicillins, Ultram  [tramadol ], Zestril [lisinopril], Cinnamon, Amoxicillin-pot clavulanate, and Lidocaine     Review of Systems  Updated Vital Signs There were no vitals taken for this visit.  Physical Exam Vitals reviewed.  Constitutional:      General: He is in acute distress.     Appearance: He is obese.  HENT:     Head: Normocephalic.     Right Ear: External ear normal.     Left Ear: External ear normal.     Nose: Nose normal.     Mouth/Throat:     Pharynx: Oropharynx is clear.  Eyes:     Pupils: Pupils are equal, round, and reactive to light.  Pulmonary:     Effort: Respiratory distress present.  Abdominal:     Palpations: Abdomen is soft.  Musculoskeletal:     Cervical back: Normal range of motion.  Skin:    General: Skin is warm.     Capillary Refill: Capillary refill takes less than 2 seconds.  Neurological:     General: No focal deficit present.     Mental Status: He is alert.     (all labs ordered are listed, but only abnormal results are displayed) Labs Reviewed  CBC  BASIC METABOLIC PANEL WITH GFR  RAPID URINE DRUG SCREEN, HOSP PERFORMED    EKG: None  Radiology: No results found.  {Document cardiac monitor, telemetry  assessment procedure when appropriate:32947} Date/Time: 01/04/2024 2:00 PM  Performed by: Levander Houston, MDPre-anesthesia Checklist: Patient identified, Emergency Drugs available, Suction available, Patient being monitored and Timeout performed Oxygen Delivery Method: Non-rebreather mask Preoxygenation: Pre-oxygenation with 100% oxygen Induction Type: Rapid sequence Ventilation: Mask ventilation without difficulty Laryngoscope Size: Glidescope and 4 Tube size: 8.0 mm Number of attempts: 1 Airway Equipment and Method: Patient positioned with wedge pillow and Video-laryngoscopy Placement Confirmation: ETT inserted through vocal cords under direct vision, Positive ETCO2, CO2 detector and Breath sounds checked- equal and bilateral Tube secured with: ETT holder Dental Injury: Teeth and Oropharynx as per pre-operative assessment        Medications Ordered in the ED  furosemide  (LASIX ) injection 60 mg (  has no administration in time range)  nitroGLYCERIN  50 mg in dextrose  5 % 250 mL (0.2 mg/mL) infusion (has no administration in time range)  LORazepam  (ATIVAN ) injection 0.5 mg (has no administration in time range)    Clinical Course as of 01/04/24 1400  Tue Jan 04, 2024  1357 Chest x-Ayah Cozzolino with enlarged heart and diffuse interstitial infiltrates [DR]  1358 VBG pH 7.37, PCO250, pO2 68 [DR]  1358 Called to bedside as patient had bradycardic episode and became unresponsive [DR]  1359 Patient with decreased oxygen saturations despite BiPAP and unresponsive.  Proceeded with RSI. [DR]    Clinical Course User Index [DR] Levander Houston, MD   {Click here for ABCD2, HEART and other calculators REFRESH Note before signing:1}                              Medical Decision Making Amount and/or Complexity of Data Reviewed Labs: ordered. Radiology: ordered.  Risk OTC drugs. Prescription drug management.   53 year old male history of CHF, with reported recent cocaine use presents today with onset  of severe dyspnea.  Patient was diaphoretic and hypertensive on presentation.  Patient was able to tolerate BiPAP initially.  Sats were at 100%.  Patient then had an episode of heart rate decreasing to in the 50s.  Blood pressure had simultaneously decreased to 160 with Lasix  given.  However, then patient became less responsive.  He was confused.  Sats were dropping into the 80s.  Decision was made to intubate. Postintubation patient was more hypertensive.  Nitroglycerin  drip had been ordered but not started prior and is now initiated.  He had increased heart rate to 128.  Post intubation chest x-Aren Pryde and EKG pending. Care discussed with critical care Cardiology paged Suspect etiology is cocaine induced hypertension and CHF.  {Document critical care time when appropriate  Document review of labs and clinical decision tools ie CHADS2VASC2, etc  Document your independent review of radiology images and any outside records  Document your discussion with family members, caretakers and with consultants  Document social determinants of health affecting pt's care  Document your decision making why or why not admission, treatments were needed:32947:::1}   Final diagnoses:  None    ED Discharge Orders     None

## 2024-01-04 NOTE — Progress Notes (Signed)
 Pt transported to 33m07 from ER without event.

## 2024-01-04 NOTE — Consult Note (Addendum)
 Cardiology Consultation   Patient ID: Luis [name] MRN: [id number]; DOB: 1971-05-14  Admit date: 01/04/2024 Date of Consult: 01/04/2024  PCP:  [name]   [facility] Providers Cardiologist:  Alm Clay, MD  Electrophysiologist:  Eulas FORBES Furbish, MD    Patient Profile: Luis [name] is a 53 y.o. male with a hx of hypertension, hyperlipidemia, diabetes, cocaine use, chronic HFrEF, history of VT s/p Medtronic BiV ICD, tobacco use, OSA on CPAP, anxiety, depression who is being seen 01/04/2024 for the evaluation of acute on chronic HFrEF at the request of Dr. Isadora.  History of Present Illness: Luis [name] has past medical history as stated above.  He presented to [name] ED via EMS on 01/04/2024 with respiratory distress.  Patient said he had been feeling short of breath the last few days and when he woke up this morning he had very rapid breathing and could not catch his breath therefore activated EMS.  He denied any prior or active chest pain.  He was A& O x 4 upon arrival to the emergency department.  His caregiver at home reports that he used cocaine 3 hours ago, patient denies and states that it was 3 days ago.  When he arrived to the emergency department he was on a nonrebreather and seemed very anxious.  While in the ED patient had an episode where his SBP was over 200, HR dropped into the 50s with his O2 sats dropping into the 80s.  He was placed on a BiPAP but then the patient became less responsive and more confused.  The decision was made to intubate.  Critical care arrived to the emergency department to intubate the patient.  Relevant workup in the ED/since admission includes: UDS positive for cocaine, initial BMP normal, initial CBC normal, BNP 601, updated echocardiogram pending, CXR showed well-positioned ET tube, no pneumothorax, cardiomegaly, worsening pulmonary edema.  He was last seen, as an outpatient, by Dr. Furbish on  07/13/2022 for EP follow up regarding his BiV ICD for VT.  At this point he was on Coreg  3.125 mg BID, Farxiga  10 mg daily, digoxin  0.125 mg daily, Entresto  97-103 mg BID, spironolactone  25 mg daily, torsemide  60 mg daily.  He was instructed to follow-up with advanced heart failure clinic in the future, but he did not return to clinic. Upon review of dispense report, it appears that the patient has not been taking them as they were all last filled 05/2023.  Past Medical History:  Diagnosis Date   Anxiety    Biventricular ICD (implantable cardioverter-defibrillator) in place    Chronic systolic CHF (congestive heart failure) (HCC)    Cocaine use    Diabetes mellitus without complication (HCC)    GERD (gastroesophageal reflux disease)    HLD (hyperlipidemia)    Hypertension    Morbid obesity (HCC)    NICM (nonischemic cardiomyopathy) (HCC)    OSA (obstructive sleep apnea)    PTSD (post-traumatic stress disorder)    on Depakote    Refusal of blood transfusions as patient is Jehovah's Witness    Tobacco abuse    Past Surgical History:  Procedure Laterality Date   BIV ICD INSERTION CRT-D     FOOT FRACTURE SURGERY     ICD GENERATOR CHANGEOUT N/A 03/23/2022   Procedure: ICD GENERATOR CHANGEOUT;  Surgeon: Furbish, Eulas FORBES, MD;  Location: [facility] INVASIVE CV LAB;  Service: Cardiovascular;  Laterality: N/A;   RIGHT HEART CATH N/A 03/06/2022   Procedure: RIGHT HEART CATH;  Surgeon:  Thukkani, Arun K, MD;  Location: [facility] INVASIVE CV LAB;  Service: Cardiovascular;  Laterality: N/A;   RIGHT/LEFT HEART CATH AND CORONARY ANGIOGRAPHY N/A 04/16/2022   Procedure: RIGHT/LEFT HEART CATH AND CORONARY ANGIOGRAPHY;  Surgeon: Rolan Ezra RAMAN, MD;  Location: [facility] INVASIVE CV LAB;  Service: Cardiovascular;  Laterality: N/A;   ROTATOR CUFF REPAIR     TONSILLECTOMY      Home Medications:  Prior to Admission medications   Medication Sig Start Date End Date Taking? Authorizing Provider  acetaminophen  (TYLENOL ) 325 MG  tablet Take 2 tablets (650 mg total) by mouth every 6 (six) hours as needed for mild pain. 10/26/22   Tharon Lung, MD  bumetanide  (BUMEX ) 2 MG tablet Take 1 tablet (2 mg total) by mouth 2 (two) times daily. 06/28/23   Angiulli, Toribio PARAS, PA-C  carvedilol  (COREG ) 3.125 MG tablet Take 1 tablet (3.125 mg total) by mouth 2 (two) times daily with a meal. 06/28/23   Angiulli, Toribio PARAS, PA-C  cholecalciferol (VITAMIN D3) 25 MCG (1000 UNIT) tablet Take 1,000 Units by mouth daily.    [provider]  clopidogrel  (PLAVIX ) 75 MG tablet Take 1 tablet (75 mg total) by mouth daily. 06/28/23   Angiulli, Toribio PARAS, PA-C  Continuous Glucose Sensor (DEXCOM G7 SENSOR) MISC Place 1 sensor onto the skin and change every 10 days 06/29/23   Kirsteins, Prentice BRAVO, MD  dapagliflozin  propanediol (FARXIGA ) 10 MG TABS tablet Take 1 tablet (10 mg total) by mouth daily. 06/28/23   Angiulli, Toribio PARAS, PA-C  diclofenac  Sodium (VOLTAREN ) 1 % GEL Apply 2 g topically 4 (four) times daily. 10/20/22   [provider]  digoxin  (LANOXIN ) 0.125 MG tablet Take 1 tablet (0.125 mg total) by mouth daily. 06/28/23   Angiulli, Toribio PARAS, PA-C  doxycycline  (VIBRAMYCIN ) 100 MG capsule Take 1 capsule (100 mg total) by mouth 2 (two) times daily. 11/10/23   Doretha Folks, MD  DULoxetine  (CYMBALTA ) 30 MG capsule Take 1 capsule (30 mg total) by mouth daily. 06/28/23   Angiulli, Toribio PARAS, PA-C  gabapentin  (NEURONTIN ) 300 MG capsule Take 1 capsule (300 mg total) by mouth 2 (two) times daily. 06/28/23   Angiulli, Daniel J, PA-C  HYDROcodone -acetaminophen  (NORCO/VICODIN) 5-325 MG tablet Take 1 tablet by mouth every 4 (four) hours as needed. 11/10/23   Doretha Folks, MD  hydrOXYzine  (ATARAX ) 25 MG tablet Take 1 tablet (25 mg total) by mouth 3 (three) times daily as needed for anxiety. 09/03/21   Nwoko, Uchenna E, PA  hydrOXYzine  (VISTARIL ) 25 MG capsule Take 25 mg by mouth 3 (three) times daily. 06/29/23   [provider]  insulin   aspart (NOVOLOG  FLEXPEN) 100 UNIT/ML FlexPen Inject 15 Units into the skin 3 (three) times daily with meals. 06/28/23   Angiulli, Toribio PARAS, PA-C  insulin  glargine (LANTUS ) 100 UNIT/ML Solostar Pen Inject 38 Units into the skin 2 (two) times daily. 06/28/23   Angiulli, Toribio PARAS, PA-C  Insulin  Pen Needle 32G X 4 MM MISC Use as directed up to four times daily 06/28/23   Angiulli, Toribio PARAS, PA-C  isoniazid  (NYDRAZID ) 300 MG tablet Take 1 tablet (300 mg total) by mouth daily. 06/28/23   Angiulli, Toribio PARAS, PA-C  loratadine  (CLARITIN ) 10 MG tablet Take 10 mg by mouth daily.    [provider]  mirtazapine (REMERON) 15 MG tablet Take 15 mg by mouth at bedtime. 06/29/23   [provider]  oxyCODONE -acetaminophen  (PERCOCET/ROXICET) 5-325 MG tablet Take 1 tablet by mouth every 8 (eight) hours  as needed for severe pain (pain score 7-10). 09/21/23   Midge Golas, MD  pantoprazole  (PROTONIX ) 40 MG tablet Take 1 tablet (40 mg total) by mouth daily. 06/28/23   Angiulli, Toribio PARAS, PA-C  potassium chloride  SA (KLOR-CON  M) 20 MEQ tablet Take 1 tablet (20 mEq total) by mouth daily. 06/28/23   Angiulli, Toribio PARAS, PA-C  prazosin  (MINIPRESS ) 2 MG capsule Take 1 capsule (2 mg total) by mouth at bedtime. 06/28/23   Angiulli, Toribio PARAS, PA-C  pyridoxine  (B-6) 100 MG tablet Take 1 tablet (100 mg total) by mouth daily. 06/28/23   Angiulli, Toribio PARAS, PA-C  rosuvastatin  (CRESTOR ) 20 MG tablet Take 1 tablet (20 mg total) by mouth daily. 06/28/23   Angiulli, Toribio PARAS, PA-C  sacubitril -valsartan  (ENTRESTO ) 97-103 MG Take 1 tablet by mouth 2 (two) times daily. 06/28/23   Angiulli, Toribio PARAS, PA-C  spironolactone  (ALDACTONE ) 25 MG tablet Take 1 tablet (25 mg total) by mouth daily. 06/28/23   Angiulli, Toribio PARAS, PA-C  traZODone  (DESYREL ) 100 MG tablet Take 3 tablets (300 mg total) by mouth at bedtime. 06/28/23   Angiulli, Daniel J, PA-C   Scheduled Meds:  NOREEN ON 01/05/2024] Chlorhexidine  Gluconate Cloth  6 each  Topical Q0600   docusate  100 mg Per Tube BID   [START ON 01/05/2024] enoxaparin  (LOVENOX ) injection  40 mg Subcutaneous Q24H   fentaNYL  (SUBLIMAZE ) injection  25-50 mcg Intravenous Once   furosemide   80 mg Intravenous Once   insulin  aspart  0-15 Units Subcutaneous Q4H   mouth rinse  15 mL Mouth Rinse Q2H   pantoprazole  (PROTONIX ) IV  40 mg Intravenous QHS   polyethylene glycol  17 g Per Tube Daily   Continuous Infusions:  fentaNYL  infusion INTRAVENOUS 100 mcg/hr (01/04/24 1600)   propofol  (DIPRIVAN ) infusion 40 mcg/kg/min (01/04/24 1600)   PRN Meds: docusate, fentaNYL , mouth rinse, polyethylene glycol  Allergies:    Allergies  Allergen Reactions   Aspirin Anaphylaxis and Other (See Comments)    Throat closing    Iodine-131 Hives   Peanut (Diagnostic) Anaphylaxis    Throat closes   Iodinated Contrast Media Hives    Can be premedicated    Latex Hives and Rash   Penicillins Nausea And Vomiting   Ultram  [Tramadol ] Hives and Nausea Only   Zestril [Lisinopril] Cough   Cinnamon    Amoxicillin-Pot Clavulanate Diarrhea   Lidocaine  Rash    Rash from adhesive on lidocaine  patches    Social History:   Social History   Socioeconomic History   Marital status: Widowed    Spouse name: Not on file   Number of children: 7   Years of education: Not on file   Highest education level: Bachelor's degree (e.g., BA, AB, BS)  Occupational History   Occupation: disability  Tobacco Use   Smoking status: Every Day    Current packs/day: 0.00    Average packs/day: 0.5 packs/day for 15.0 years (7.5 ttl pk-yrs)    Types: Cigarettes    Start date: 10/2006    Last attempt to quit: 10/2021    Years since quitting: 2.1   Smokeless tobacco: Never   Tobacco comments:    occasionally  Vaping Use   Vaping status: Never Used  Substance and Sexual Activity   Alcohol  use: Not Currently   Drug use: Yes    Types: Cocaine, Marijuana    Comment: using in binges, 1/4gm daily x 2 weeks.   Sexual  activity: Not Currently  Other Topics Concern   Not  on file  Social History Narrative   Not on file   Social Drivers of Health   Financial Resource Strain: High Risk (11/16/2022)   Overall Financial Resource Strain (CARDIA)    Difficulty of Paying Living Expenses: Hard  Food Insecurity: Low Risk  (09/16/2023)   Received from [facility]   Hunger Vital Sign    Within the past 12 months, you worried that your food would run out before you got money to buy more: Never true    Within the past 12 months, the food you bought just didn't last and you didn't have money to get more. : Never true  Transportation Needs: No Transportation Needs (09/16/2023)   Received from [facility]    In the past 12 months, has lack of reliable transportation kept you from medical appointments, meetings, work or from getting things needed for daily living? : No  Physical Activity: Not on file  Stress: No Stress Concern Present (04/25/2023)   Received from [facility] - Occupational Stress Questionnaire    Feeling of Stress : Not at all  Social Connections: Unknown (08/26/2022)   Received from [facility]   Social Network    Social Network: Not on file  Intimate Partner Violence: Not At Risk (06/13/2023)   Humiliation, Afraid, Rape, and Kick questionnaire    Fear of Current or Ex-Partner: No    Emotionally Abused: No    Physically Abused: No    Sexually Abused: No    Family History:   Family History  Problem Relation Age of Onset   Hypertension Mother    Bone cancer Father    Lung cancer Father     ROS:  Please see the history of present illness.  All other ROS reviewed and negative.     Physical Exam/Data: Vitals:   01/04/24 1533 01/04/24 1536 01/04/24 1600 01/04/24 1615  BP: 111/63 112/70 (!) 79/31 123/77  Pulse: (!) 106 (!) 106 100 95  Resp: (!) 27 (!) 30 (!) 29 (!) 34  Temp: 98.5 F (36.9 C) 98.4 F (36.9 C)  97.9 F  (36.6 C)  SpO2: 91% 91% 99% 91%  Weight:        Intake/Output Summary (Last 24 hours) at 01/04/2024 1640 Last data filed at 01/04/2024 1600 Gross per 24 hour  Intake 69.34 ml  Output --  Net 69.34 ml      01/04/2024    1:39 PM 11/10/2023    5:48 PM 09/29/2023   10:46 AM  Last 3 Weights  Weight (lbs) 276 lb 14.4 oz 276 lb 14.4 oz 277 lb  Weight (kg) 125.6 kg 125.6 kg 125.646 kg     Body mass index is 46.08 kg/m.   General: obese male, intubated and sedated  Neck: unable to assess JVD Vascular:  Distal pulses 2+ bilaterally Cardiac:  normal S1, S2; RRR; no murmur  Lungs: bilateral crackles    Abd: soft, nontender, no hepatomegaly  Ext: 2+ LE edema Musculoskeletal:  No deformities Skin: warm and dry  Neuro:  sedated  Telemetry:  Telemetry was personally reviewed and demonstrates:  v-paced rhythm, HR 90s  Relevant CV Studies: Echocardiogram, 01/04/2024 Ordered, pending results  Echocardiogram, 06/14/2023 Left ventricular ejection fraction, by estimation, is 25 to 30%. The left ventricle has normal function. The left ventricle demonstrates global hypokinesis. Left ventricular diastolic parameters are consistent with Grade I diastolic dysfunction  (impaired relaxation).  Right ventricular systolic function is mildly reduced.  The right ventricular size is normal. Tricuspid regurgitation signal is inadequate for assessing PA pressure.  The mitral valve is normal in structure. No evidence of mitral valve regurgitation.  The aortic valve is tricuspid. Aortic valve regurgitation is not visualized. No aortic stenosis is present.  The inferior vena cava is normal in size with greater than 50% respiratory variability, suggesting right atrial pressure of 3 mmHg.   RHC/LHC, 04/16/2022 performed by Dr. Rolan 1. No significant CAD, nonischemic cardiomyopathy.  2. Significantly elevated right and left heart filling pressures.  3. Low cardiac output.  4. Mild pulmonary venous hypertension.    Laboratory Data: High Sensitivity Troponin:  No results for input(s): TROPONINIHS in the last 720 hours.   Chemistry Recent Labs  Lab 01/04/24 1308 01/04/24 1333 01/04/24 1438 01/04/24 1510 01/04/24 1630  NA 138   < > 137 135 137  K 4.0   < > 4.0 4.0 4.6  CL 101  --   --  104  --   CO2 25  --   --  20*  --   GLUCOSE 264*  --   --  373*  --   BUN 8  --   --  13  --   CREATININE 0.79  --   --  0.93  --   CALCIUM  9.1  --   --  8.1*  --   MG  --   --   --  1.6*  --   GFRNONAA >60  --   --  >60  --   ANIONGAP 12  --   --  11  --    < > = values in this interval not displayed.    No results for input(s): PROT, ALBUMIN, AST, ALT, ALKPHOS, BILITOT in the last 168 hours. Lipids No results for input(s): CHOL, TRIG, HDL, LABVLDL, LDLCALC, CHOLHDL in the last 168 hours.  Hematology Recent Labs  Lab 01/04/24 1308 01/04/24 1333 01/04/24 1438 01/04/24 1630  WBC 10.4  --   --   --   RBC 4.86  --   --   --   HGB 14.0 14.6 15.3 14.3  HCT 43.8 43.0 45.0 42.0  MCV 90.1  --   --   --   MCH 28.8  --   --   --   MCHC 32.0  --   --   --   RDW 13.2  --   --   --   PLT 259  --   --   --    Thyroid  No results for input(s): TSH, FREET4 in the last 168 hours.  BNP Recent Labs  Lab 01/04/24 1510  BNP 601.5*    DDimer No results for input(s): DDIMER in the last 168 hours.  Radiology/Studies:  DG CHEST PORT 1 VIEW Result Date: 01/04/2024 CLINICAL DATA:  [id number] Endotracheally intubated [id number] EXAM: PORTABLE CHEST - 1 VIEW COMPARISON:  January 04, 2024 1:18 p.m. FINDINGS: Well-positioned endotracheal tube terminating in the mid trachea. Esophagogastric tube courses below the diaphragm with the distal tip outside the field of view. Left chest pacemaker/AICD with leads terminating in the right atrium, right ventricle, and coronary sinus. Increasing bilateral perihilar interstitial opacities with patchy airspace opacities in both lung bases. No pleural effusion or  pneumothorax. Mild cardiomegaly. IMPRESSION: 1. Well-positioned endotracheal tube terminating in the mid trachea. No pneumothorax. While the esophagogastric tube courses below the diaphragm, the tip is not included in the field of view. 2. Cardiomegaly with findings of worsening pulmonary  edema. Electronically Signed   By: Rogelia Myers M.D.   On: 01/04/2024 15:25   DG Chest Port 1 View Result Date: 01/04/2024 CLINICAL DATA:  Dyspnea. EXAM: PORTABLE CHEST 1 VIEW COMPARISON:  09/15/2023. FINDINGS: Stable cardiomegaly. Stable left chest wall multi lead pacing device. Bilateral perihilar interstitial prominence, most pronounced at the lung bases, is suggestive of pulmonary vascular congestion with possible mild pulmonary interstitial edema. No focal consolidation, pleural effusion, or pneumothorax. No acute osseous abnormality. IMPRESSION: Cardiomegaly with pulmonary vascular congestion and possible mild pulmonary interstitial edema. Electronically Signed   By: Harrietta Sherry M.D.   On: 01/04/2024 13:48   Assessment and Plan:  Acute on chronic systolic heart failure Acute hypoxic respiratory failure  Nonischemic cardiomyopathy History of ventricular tachycardia S/p Medtronic BiV ICD Medication noncompliance  Patient was last seen by advanced heart failure November 2023 Presented to emergency department with shortness of breath Patient had SpO2 in the 80s, diaphoretic, hypotensive Originally placed on BiPAP, then became unresponsive and was intubated BNP 601 CXR shows cardiomegaly, worsening pulmonary edema Patient given IV Lasix  60 mg x 1, scheduled to get 80 mg later today Currently intubated Home meds: Coreg  3.125 mg BID, Farxiga  10 mg daily, digoxin  0.125 mg daily, Entresto  97-103 mg BID, spironolactone  25 mg daily, torsemide  60 mg daily All medications have not been filled since 05/2023 Pending updated echocardiogram Agree with continuing IV Lasix  as above, monitor response, adjust as  needed Continue strict I&O's, daily weights, daily BMPs Consider RHC/LHC in future when stable   Per primary Pulmonary HTN OSA on CPAP Hyperlipidemia Diabetes Cocaine abuse Anxiety Depression PTSD  Risk Assessment/Risk Scores:      [place]  Heart Association (NYHA) Functional Class NYHA Class III    For questions or updates, please contact [facility] Please consult [email or web address] for contact info under    Signed, Waddell DELENA Donath, PA-C  01/04/2024 4:40 PM  History and all data above reviewed.  I personally took the history today, performed the physical exam and formulated the assessment and plan.  I reviewed all relevant tests and studies.  The patient is intubated and sedated.  History was obtained from the ED notes.  Recorded as above.  Patient examined.  I agree with the findings as above.  The patient exam reveals COR:RRR  ,  Lungs:   Decreased dependent crackles  ,  Abd: Positive bowel sounds, no rebound no guarding, Ext Mild/mod edema   .  All available labs, radiology testing, previous records reviewed. Agree with documented assessment and plan.   Acute systolic HF:  Likely secondary to med non adherence exacerbated by lifestyle.  He is not in shock and seems to be responding to diuresis.  Agree with 80 mg IV bid Lasix .  Currently NPO and BP is down post intubation and sedation.  We will follow to slowly help to titrate meds.   Luis [name]  8:07 PM  01/04/2024

## 2024-01-05 ENCOUNTER — Inpatient Hospital Stay (HOSPITAL_COMMUNITY)

## 2024-01-05 DIAGNOSIS — E440 Moderate protein-calorie malnutrition: Secondary | ICD-10-CM | POA: Insufficient documentation

## 2024-01-05 DIAGNOSIS — I5021 Acute systolic (congestive) heart failure: Secondary | ICD-10-CM | POA: Diagnosis not present

## 2024-01-05 DIAGNOSIS — J9601 Acute respiratory failure with hypoxia: Secondary | ICD-10-CM | POA: Diagnosis not present

## 2024-01-05 DIAGNOSIS — J9602 Acute respiratory failure with hypercapnia: Secondary | ICD-10-CM | POA: Diagnosis not present

## 2024-01-05 DIAGNOSIS — E119 Type 2 diabetes mellitus without complications: Secondary | ICD-10-CM

## 2024-01-05 DIAGNOSIS — J690 Pneumonitis due to inhalation of food and vomit: Secondary | ICD-10-CM | POA: Diagnosis not present

## 2024-01-05 LAB — CBC WITH DIFFERENTIAL/PLATELET
Abs Immature Granulocytes: 0.04 K/uL (ref 0.00–0.07)
Abs Immature Granulocytes: 0.04 K/uL (ref 0.00–0.07)
Basophils Absolute: 0 K/uL (ref 0.0–0.1)
Basophils Absolute: 0 K/uL (ref 0.0–0.1)
Basophils Relative: 0 %
Basophils Relative: 0 %
Eosinophils Absolute: 0.1 K/uL (ref 0.0–0.5)
Eosinophils Absolute: 0.1 K/uL (ref 0.0–0.5)
Eosinophils Relative: 1 %
Eosinophils Relative: 1 %
HCT: 42.8 % (ref 39.0–52.0)
HCT: 42.5 % (ref 39.0–52.0)
Hemoglobin: 13.6 g/dL (ref 13.0–17.0)
Hemoglobin: 13.6 g/dL (ref 13.0–17.0)
Immature Granulocytes: 0 %
Immature Granulocytes: 0 %
Lymphocytes Relative: 20 %
Lymphocytes Relative: 19 %
Lymphs Abs: 2.3 K/uL (ref 0.7–4.0)
Lymphs Abs: 2.1 K/uL (ref 0.7–4.0)
MCH: 29.2 pg (ref 26.0–34.0)
MCH: 29.5 pg (ref 26.0–34.0)
MCHC: 31.8 g/dL (ref 30.0–36.0)
MCHC: 32 g/dL (ref 30.0–36.0)
MCV: 91.8 fL (ref 80.0–100.0)
MCV: 92.2 fL (ref 80.0–100.0)
Monocytes Absolute: 0.9 K/uL (ref 0.1–1.0)
Monocytes Absolute: 0.8 K/uL (ref 0.1–1.0)
Monocytes Relative: 8 %
Monocytes Relative: 7 %
Neutro Abs: 8.2 K/uL — ABNORMAL HIGH (ref 1.7–7.7)
Neutro Abs: 8.1 K/uL — ABNORMAL HIGH (ref 1.7–7.7)
Neutrophils Relative %: 71 %
Neutrophils Relative %: 73 %
Platelets: 191 K/uL (ref 150–400)
Platelets: 193 K/uL (ref 150–400)
RBC: 4.66 MIL/uL (ref 4.22–5.81)
RBC: 4.61 MIL/uL (ref 4.22–5.81)
RDW: 13.2 % (ref 11.5–15.5)
RDW: 13.2 % (ref 11.5–15.5)
WBC: 11.5 K/uL — ABNORMAL HIGH (ref 4.0–10.5)
WBC: 11.2 K/uL — ABNORMAL HIGH (ref 4.0–10.5)
nRBC: 0 % (ref 0.0–0.2)
nRBC: 0 % (ref 0.0–0.2)

## 2024-01-05 LAB — BASIC METABOLIC PANEL WITH GFR
Anion gap: 10 (ref 5–15)
Anion gap: 8 (ref 5–15)
BUN: 11 mg/dL (ref 6–20)
BUN: 15 mg/dL (ref 6–20)
CO2: 24 mmol/L (ref 22–32)
CO2: 26 mmol/L (ref 22–32)
Calcium: 8.3 mg/dL — ABNORMAL LOW (ref 8.9–10.3)
Calcium: 8.4 mg/dL — ABNORMAL LOW (ref 8.9–10.3)
Chloride: 102 mmol/L (ref 98–111)
Chloride: 104 mmol/L (ref 98–111)
Creatinine, Ser: 1.02 mg/dL (ref 0.61–1.24)
Creatinine, Ser: 1.05 mg/dL (ref 0.61–1.24)
GFR, Estimated: >60 mL/min (ref >60)
GFR, Estimated: >60 mL/min (ref >60)
Glucose, Bld: 182 mg/dL — ABNORMAL HIGH (ref 70–99)
Glucose, Bld: 357 mg/dL — ABNORMAL HIGH (ref 70–99)
Potassium: 3.8 mmol/L (ref 3.5–5.1)
Potassium: 4 mmol/L (ref 3.5–5.1)
Sodium: 136 mmol/L (ref 135–145)
Sodium: 138 mmol/L (ref 135–145)

## 2024-01-05 LAB — GLUCOSE, CAPILLARY
Glucose-Capillary: 105 mg/dL — ABNORMAL HIGH (ref 70–99)
Glucose-Capillary: 146 mg/dL — ABNORMAL HIGH (ref 70–99)
Glucose-Capillary: 158 mg/dL — ABNORMAL HIGH (ref 70–99)
Glucose-Capillary: 162 mg/dL — ABNORMAL HIGH (ref 70–99)
Glucose-Capillary: 207 mg/dL — ABNORMAL HIGH (ref 70–99)
Glucose-Capillary: 254 mg/dL — ABNORMAL HIGH (ref 70–99)

## 2024-01-05 LAB — RENAL FUNCTION PANEL
Albumin: 2.6 g/dL — ABNORMAL LOW (ref 3.5–5.0)
Anion gap: 11 (ref 5–15)
BUN: 11 mg/dL (ref 6–20)
CO2: 25 mmol/L (ref 22–32)
Calcium: 8.2 mg/dL — ABNORMAL LOW (ref 8.9–10.3)
Chloride: 103 mmol/L (ref 98–111)
Creatinine, Ser: 0.99 mg/dL (ref 0.61–1.24)
GFR, Estimated: >60 mL/min (ref >60)
Glucose, Bld: 139 mg/dL — ABNORMAL HIGH (ref 70–99)
Phosphorus: 3.9 mg/dL (ref 2.5–4.6)
Potassium: 3.8 mmol/L (ref 3.5–5.1)
Sodium: 139 mmol/L (ref 135–145)

## 2024-01-05 LAB — BLOOD GAS, VENOUS
Acid-Base Excess: 4.5 mmol/L — ABNORMAL HIGH (ref 0.0–2.0)
Acid-Base Excess: 6.2 mmol/L — ABNORMAL HIGH (ref 0.0–2.0)
Bicarbonate: 32.5 mmol/L — ABNORMAL HIGH (ref 20.0–28.0)
Bicarbonate: 32.7 mmol/L — ABNORMAL HIGH (ref 20.0–28.0)
Drawn by: 64724
O2 Saturation: 49 %
O2 Saturation: 89.1 %
Patient temperature: 37.8
Patient temperature: 38.9
pCO2, Ven: 65 mmHg — ABNORMAL HIGH (ref 44–60)
pCO2, Ven: 59 mmHg (ref 44–60)
pH, Ven: 7.31 (ref 7.25–7.43)
pH, Ven: 7.36 (ref 7.25–7.43)
pO2, Ven: 32 mmHg (ref 32–45)
pO2, Ven: 63 mmHg — ABNORMAL HIGH (ref 32–45)

## 2024-01-05 LAB — TRIGLYCERIDES: Triglycerides: 103 mg/dL (ref <150)

## 2024-01-05 LAB — ECHOCARDIOGRAM COMPLETE
AR max vel: 1.84 cm2
AV Area VTI: 1.82 cm2
AV Area mean vel: 2.02 cm2
AV Mean grad: 5 mmHg
AV Peak grad: 8.5 mmHg
Ao pk vel: 1.46 m/s
Area-P 1/2: 4.6 cm2
Est EF: <20
S' Lateral: 4.3 cm
Weight: 3188.73 [oz_av]

## 2024-01-05 LAB — MAGNESIUM
Magnesium: 1.7 mg/dL (ref 1.7–2.4)
Magnesium: 1.7 mg/dL (ref 1.7–2.4)
Magnesium: 1.9 mg/dL (ref 1.7–2.4)

## 2024-01-05 LAB — STREP PNEUMONIAE URINARY ANTIGEN: Strep Pneumo Urinary Antigen: NEGATIVE

## 2024-01-05 LAB — TROPONIN I (HIGH SENSITIVITY)
Troponin I (High Sensitivity): 49 ng/L — ABNORMAL HIGH (ref <18)
Troponin I (High Sensitivity): 48 ng/L — ABNORMAL HIGH (ref <18)

## 2024-01-05 LAB — PHOSPHORUS: Phosphorus: 3.8 mg/dL (ref 2.5–4.6)

## 2024-01-05 LAB — PROCALCITONIN: Procalcitonin: 1.22 ng/mL

## 2024-01-05 MED ORDER — MAGNESIUM SULFATE 2 GM/50ML IV SOLN
2.0000 g | Freq: Once | INTRAVENOUS | Status: AC
  Administered 2024-01-05: 2 g via INTRAVENOUS
  Filled 2024-01-05: qty 50

## 2024-01-05 MED ORDER — ACETAMINOPHEN 325 MG PO TABS
650.0000 mg | ORAL_TABLET | Freq: Four times a day (QID) | ORAL | Status: DC | PRN
  Administered 2024-01-05: 650 mg
  Filled 2024-01-05: qty 2

## 2024-01-05 MED ORDER — VANCOMYCIN HCL 2000 MG/400ML IV SOLN
2000.0000 mg | Freq: Once | INTRAVENOUS | Status: AC
  Administered 2024-01-05: 2000 mg via INTRAVENOUS
  Filled 2024-01-05: qty 400

## 2024-01-05 MED ORDER — FENTANYL CITRATE PF 50 MCG/ML IJ SOSY
50.0000 ug | PREFILLED_SYRINGE | INTRAMUSCULAR | Status: DC | PRN
  Administered 2024-01-05 (×2): 100 ug via INTRAVENOUS
  Administered 2024-01-06: 150 ug via INTRAVENOUS
  Administered 2024-01-06 (×2): 100 ug via INTRAVENOUS
  Filled 2024-01-05 (×3): qty 2
  Filled 2024-01-05: qty 1
  Filled 2024-01-05: qty 2
  Filled 2024-01-05: qty 1

## 2024-01-05 MED ORDER — INSULIN GLARGINE-YFGN 100 UNIT/ML ~~LOC~~ SOLN
15.0000 [IU] | Freq: Every day | SUBCUTANEOUS | Status: DC
  Administered 2024-01-05 – 2024-01-06 (×2): 15 [IU] via SUBCUTANEOUS
  Filled 2024-01-05 (×3): qty 0.15

## 2024-01-05 MED ORDER — ADULT MULTIVITAMIN LIQUID CH
15.0000 mL | Freq: Every day | ORAL | Status: DC
  Administered 2024-01-05: 15 mL
  Filled 2024-01-05 (×4): qty 15

## 2024-01-05 MED ORDER — PIPERACILLIN-TAZOBACTAM 3.375 G IVPB
3.3750 g | Freq: Three times a day (TID) | INTRAVENOUS | Status: DC
  Administered 2024-01-05 – 2024-01-07 (×6): 3.375 g via INTRAVENOUS
  Filled 2024-01-05 (×6): qty 50

## 2024-01-05 MED ORDER — PROSOURCE TF20 ENFIT COMPATIBL EN LIQD
60.0000 mL | Freq: Every day | ENTERAL | Status: DC
  Administered 2024-01-05: 60 mL
  Filled 2024-01-05: qty 60

## 2024-01-05 MED ORDER — OSMOLITE 1.5 CAL PO LIQD
1000.0000 mL | ORAL | Status: DC
  Administered 2024-01-05: 1000 mL
  Filled 2024-01-05: qty 1000

## 2024-01-05 MED ORDER — VANCOMYCIN HCL 500 MG/100ML IV SOLN
500.0000 mg | Freq: Two times a day (BID) | INTRAVENOUS | Status: DC
  Administered 2024-01-06 (×2): 500 mg via INTRAVENOUS
  Filled 2024-01-05 (×3): qty 100

## 2024-01-05 MED ORDER — ACETAMINOPHEN 10 MG/ML IV SOLN: 1000.0000 mg | Freq: Four times a day (QID) | INTRAVENOUS | Status: DC | PRN

## 2024-01-05 MED ORDER — FENTANYL CITRATE PF 50 MCG/ML IJ SOSY
50.0000 ug | PREFILLED_SYRINGE | INTRAMUSCULAR | Status: DC | PRN
  Filled 2024-01-05: qty 1

## 2024-01-05 MED ORDER — POTASSIUM CHLORIDE 20 MEQ PO PACK
40.0000 meq | PACK | Freq: Once | ORAL | Status: AC
  Administered 2024-01-05: 40 meq
  Filled 2024-01-05: qty 2

## 2024-01-05 MED ORDER — THIAMINE MONONITRATE 100 MG PO TABS
100.0000 mg | ORAL_TABLET | Freq: Every day | ORAL | Status: DC
  Administered 2024-01-05: 100 mg
  Filled 2024-01-05: qty 1

## 2024-01-05 NOTE — Progress Notes (Signed)
 NAME:  Luis [name], MRN:  [id number], DOB:  1971/05/23, LOS: 1 ADMISSION DATE:  01/04/2024,   History of Present Illness:  53 year old male history of HFrEF and cocaine use presented to the hospital with shortness of breath found to have acute hypoxic/hypercapnic respiratory failure and decompensated HFrEF.  Intubated in the ED due to hypoxemia, bradycardia and unresponsiveness.  Pertinent  Medical History  Tobacco use, HTN, HLD, DM, cocaine, systolic heart failure  Significant Hospital Events: Including procedures, antibiotic start and stop dates in addition to other pertinent events   7/8: Admit for acute HF, acute hypoxic and hypercapnic respiratory failure. Intubated in ED 7/9: Starting Vanc and Zosyn , continues on fentanyl  and propofol , gradually weaning off vent  Interim History / Subjective:  Low grade fevers overnight to 100.8, 100.6 again this a.m. Pro-Cal elevated to 1.22 Remains intubated and sedated  Objective    Blood pressure 133/72, pulse 84, temperature 99.9 F (37.7 C), resp. rate (!) 28, weight 90.4 kg, SpO2 100%.    Vent Mode: PRVC FiO2 (%):  [40 %-100 %] 40 % Set Rate:  [15 bmp-30 bmp] 28 bmp Vt Set:  [450 mL-490 mL] 450 mL PEEP:  [5 cmH20-8 cmH20] 5 cmH20 Plateau Pressure:  [17 cmH20-33 cmH20] 20 cmH20   Intake/Output Summary (Last 24 hours) at 01/05/2024 0745 Last data filed at 01/05/2024 0550 Gross per 24 hour  Intake 590.24 ml  Output 1825 ml  Net -1234.76 ml   Filed Weights   01/04/24 1339 01/05/24 0500  Weight: 125.6 kg 90.4 kg    Examination: General: Laying in bed, intubated and sedated, in no acute distress HENT: MMM, normocephalic and atraumatic Lungs: CTAB on mechanical ventilation, no W/R/R Abdomen: Normoactive bowel sounds, soft, nontender, nondistended Extremities: No edema to BLE Neuro: Sedated Skin: Warm, dry  Resolved problem list   Assessment and Plan   NEURO #Acute metabolic encephalopathy 2/2 hypercarbia, intubated  and sedated #History of CVA December 2024 Goal RASS -1, goal CPOT <2 --Continue sedation with propofol , fentanyl  -- Prescribed statin and Plavix , no fill history on file   PULM # Acute hypercapnic and hypoxic respiratory failure # Respiratory acidosis  2/2 acute HFrEF exacerbation, likely flash pulmonary edema # History of type II pulmonary hypertension found on cath in 2023 Report of sudden onset dyspnea, suspect secondary to flash pulmonary edema.  No evidence of pneumonia, pneumothorax.  Most recent ABG 2300 with pH7.3/pCO252.5/pO2 78/bicarb 27.6.  Currently on PRVC with PEEP 5, FiO2 40%.   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  Report of recent cocaine use, unclear exact timing (3hr vs 3 days PTA).    Troponin 33 > 54 Last echo December 2024 with EF 25 to 30% and normal LV function. Echo  here shows EF less than 20%. severely decreased LV function, G2 DD, normal RV function, normal MV, normal AV.  Cardiology saw patient yesterday, agreed with aggressive diuresis. S/p 60, 80 mg Lasix  IV yesterday.   Last seen by cardiology in January 2024, was prescribed Coreg  3.125 mg BID, Farxiga  10 mg daily, digoxin  0.125 mg daily, Entresto  97-103 mg  BID, spironolactone  25 mg daily, torsemide  60 mg daily at that time. Last filled 12/24. --Lasix  80 mg twice daily today -- Strict I/O's --Daily weights --Daily BMP, Mg (maintain K>4, Mg>2) --repeat trops --Cards will slowly titrate GDMT --Consider RHC/LHC in future when stable per cards  INFECTIOUS #Concern for aspiration  PNA Low-grade fevers overnight to 99.9. Procalcitonin 0.13 > 1.22 on repeat. LA 1.9. WCC 10.4 on admit > 11.4. Possibly 2/2 aspiration from intubation --start vanc/zosyn  --f/u resp culture --f/u blood cx --daily CBC --Tylenol  q6h PRN for fever   RENAL Stable renal function on admission, good UOP so far with 1.8 L yesterday. --Goal -2L by EOD today, will plan to increase diuresis as needed to reach this goal --CTM with daily BMP -- Strict I's and O's as above  HEME/ONC #Patient is a Jehovah's Witness, refuses blood products DVT prophylaxis- Lovenox   ENDOCRINE #Hx DM2, A1c 13.9 --adding Lantus  15 u daily --SSI --q4h CBG --DM education later in hospitalization   FEN/GI #Concern for malnutrition and wt loss RD following --start tube feeds per RD  Best Practice (right click and Reselect all SmartList Selections daily)   Diet/type: NPO DVT prophylaxis LMWH Pressure ulcer(s): N/A GI prophylaxis: PPI Lines: N/A Foley:  Yes, and it is still needed Code Status:  full code Last date of multidisciplinary goals of care discussion [n/a]  Labs   CBC: Recent Labs  Lab 01/04/24 1308 01/04/24 1333 01/04/24 1438 01/04/24 1630 01/04/24 1754 01/04/24 2307  WBC 10.4  --   --   --   --   --   HGB 14.0 14.6 15.3 14.3 13.6 12.6*  HCT 43.8 43.0 45.0 42.0 40.0 37.0*  MCV 90.1  --   --   --   --   --   PLT 259  --   --   --   --   --     Basic Metabolic Panel: Recent Labs  Lab 01/04/24 1308 01/04/24 1333 01/04/24 1510 01/04/24 1630 01/04/24 1754 01/04/24 2307 01/05/24 0003 01/05/24 0614  NA 138   < > 135 137 138 136 136 138  K 4.0   < > 4.0 4.6 4.4 4.0 4.0 3.8  CL 101  --  104  --   --   --  102 104  CO2 25  --  20*  --   --   --  24 26  GLUCOSE 264*  --  373*  --   --   --  357* 182*  BUN 8  --  13  --   --   --  15 11  CREATININE 0.79  --  0.93  --   --   --  1.02 1.05  CALCIUM  9.1  --  8.1*  --   --   --  8.4* 8.3*  MG  --   --  1.6*  --   --   --  1.7 1.7   <  > = values in this interval not displayed.   GFR: Estimated Creatinine Clearance: 82.2 mL/min (by C-G formula based on SCr of 1.05 mg/dL). Recent Labs  Lab 01/04/24 1308 01/04/24 1510 01/04/24 1634 01/04/24 1804  PROCALCITON  --   --  0.13  --   WBC 10.4  --   --   --   LATICACIDVEN  --  1.4  --  1.9    Liver Function Tests: Recent Labs  Lab 01/04/24 1804  AST 29  ALT 18  ALKPHOS 57  BILITOT 0.4  PROT 6.0*  ALBUMIN 2.4*   No results for input(s): LIPASE, AMYLASE in the last 168 hours. No results for input(s): AMMONIA in the last 168 hours.  ABG    Component Value Date/Time   PHART 7.333 (L) 01/04/2024 2307   PCO2ART 52.5 (H) 01/04/2024 2307   PO2ART 78 (L) 01/04/2024 2307   HCO3 27.6 01/04/2024 2307   TCO2 29 01/04/2024 2307   ACIDBASEDEF 1.0 01/04/2024 1754   O2SAT 93 01/04/2024 2307     Coagulation Profile: No results for input(s): INR, PROTIME in the last 168 hours.  Cardiac Enzymes: No results for input(s): CKTOTAL, CKMB, CKMBINDEX, TROPONINI in the last 168 hours.  HbA1C: Hgb A1c MFr Bld  Date/Time Value Ref Range Status  01/04/2024 04:34 PM 13.9 (H) 4.8 - 5.6 % Final    Comment:    (NOTE) Diagnosis of Diabetes The following HbA1c ranges recommended by the American Diabetes Association (ADA) may be used as an aid in the diagnosis of diabetes mellitus.  Hemoglobin             Suggested A1C NGSP%              Diagnosis  <5.7                   Non Diabetic  5.7-6.4                Pre-Diabetic  >6.4                   Diabetic  <7.0                   Glycemic control for                       adults with diabetes.    06/13/2023 04:43 AM 12.0 (H) 4.8 - 5.6 % Final    Comment:    (NOTE) Pre diabetes:          5.7%-6.4%  Diabetes:              >6.4%  Glycemic control for   <7.0% adults with diabetes     CBG: Recent Labs  Lab 01/04/24 1558 01/04/24 1936 01/04/24 2343 01/05/24 0332 01/05/24 0725  GLUCAP 336* 327*  349* 254* 158*    Review of Systems:   none  Past Medical History:  He,  has a past medical history of Anxiety, Biventricular ICD (implantable cardioverter-defibrillator) in place, Chronic systolic CHF (congestive heart failure) (HCC), Cocaine use, Diabetes mellitus without complication (HCC), GERD (gastroesophageal reflux disease), HLD (hyperlipidemia), Hypertension, Morbid obesity (HCC), NICM (nonischemic cardiomyopathy) (HCC), OSA (obstructive sleep apnea), PTSD (post-traumatic stress disorder), Refusal of blood transfusions as patient is Jehovah's Witness, and Tobacco abuse.   Surgical History:   Past Surgical History:  Procedure Laterality Date   BIV ICD INSERTION CRT-D     FOOT FRACTURE SURGERY     ICD GENERATOR CHANGEOUT N/A 03/23/2022   Procedure: ICD GENERATOR CHANGEOUT;  Surgeon: Nancey, Eulas BRAVO, MD;  Location: [facility] INVASIVE CV LAB;  Service: Cardiovascular;  Laterality: N/A;   RIGHT HEART CATH N/A 03/06/2022   Procedure: RIGHT HEART CATH;  Surgeon: Wendel Lurena POUR, MD;  Location: [facility] INVASIVE CV LAB;  Service: Cardiovascular;  Laterality: N/A;   RIGHT/LEFT HEART CATH AND CORONARY ANGIOGRAPHY N/A 04/16/2022   Procedure: RIGHT/LEFT HEART CATH AND CORONARY ANGIOGRAPHY;  Surgeon: Rolan Ezra RAMAN, MD;  Location: [facility] INVASIVE CV LAB;  Service:  Cardiovascular;  Laterality: N/A;   ROTATOR CUFF REPAIR     TONSILLECTOMY       Social History:   reports that he has been smoking cigarettes. He started smoking about 17 years ago. He has a 7.5 pack-year smoking history. He has never used smokeless tobacco. He reports that he does not currently use alcohol . He reports current drug use. Drugs: Cocaine and Marijuana.   Family History:  His family history includes Bone cancer in his father; Hypertension in his mother; Lung cancer in his father.   Allergies Allergies  Allergen Reactions   Aspirin Anaphylaxis and Other (See Comments)    Throat closing    Iodine-131 Hives   Peanut  (Diagnostic) Anaphylaxis    Throat closes   Iodinated Contrast Media Hives    Can be premedicated    Latex Hives and Rash   Penicillins Nausea And Vomiting   Ultram  [Tramadol ] Hives and Nausea Only   Zestril [Lisinopril] Cough   Cinnamon    Amoxicillin-Pot Clavulanate Diarrhea   Lidocaine  Rash    Rash from adhesive on lidocaine  patches      Home Medications  Prior to Admission medications   Medication Sig Start Date End Date Taking? Authorizing Provider  acetaminophen  (TYLENOL ) 325 MG tablet Take 2 tablets (650 mg total) by mouth every 6 (six) hours as needed for mild pain. 10/26/22   Tharon Lung, MD  bumetanide  (BUMEX ) 2 MG tablet Take 1 tablet (2 mg total) by mouth 2 (two) times daily. 06/28/23   Angiulli, Toribio PARAS, PA-C  carvedilol  (COREG ) 3.125 MG tablet Take 1 tablet (3.125 mg total) by mouth 2 (two) times daily with a meal. 06/28/23   Angiulli, Toribio PARAS, PA-C  cholecalciferol (VITAMIN D3) 25 MCG (1000 UNIT) tablet Take 1,000 Units by mouth daily.    [provider]  clopidogrel  (PLAVIX ) 75 MG tablet Take 1 tablet (75 mg total) by mouth daily. 06/28/23   Angiulli, Toribio PARAS, PA-C  Continuous Glucose Sensor (DEXCOM G7 SENSOR) MISC Place 1 sensor onto the skin and change every 10 days 06/29/23   Kirsteins, Prentice BRAVO, MD  dapagliflozin  propanediol (FARXIGA ) 10 MG TABS tablet Take 1 tablet (10 mg total) by mouth daily. 06/28/23   Angiulli, Toribio PARAS, PA-C  diclofenac  Sodium (VOLTAREN ) 1 % GEL Apply 2 g topically 4 (four) times daily. 10/20/22   [provider]  digoxin  (LANOXIN ) 0.125 MG tablet Take 1 tablet (0.125 mg total) by mouth daily. 06/28/23   Angiulli, Toribio PARAS, PA-C  doxycycline  (VIBRAMYCIN ) 100 MG capsule Take 1 capsule (100 mg total) by mouth 2 (two) times daily. 11/10/23   Doretha Folks, MD  DULoxetine  (CYMBALTA ) 30 MG capsule Take 1 capsule (30 mg total) by mouth daily. 06/28/23   Angiulli, Toribio PARAS, PA-C  gabapentin  (NEURONTIN ) 300 MG capsule Take 1  capsule (300 mg total) by mouth 2 (two) times daily. 06/28/23   Angiulli, Daniel J, PA-C  HYDROcodone -acetaminophen  (NORCO/VICODIN) 5-325 MG tablet Take 1 tablet by mouth every 4 (four) hours as needed. 11/10/23   Doretha Folks, MD  hydrOXYzine  (ATARAX ) 25 MG tablet Take 1 tablet (25 mg total) by mouth 3 (three) times daily as needed for anxiety. 09/03/21   Nwoko, Uchenna E, PA  hydrOXYzine  (VISTARIL ) 25 MG capsule Take 25 mg by mouth 3 (three) times daily. 06/29/23   [provider]  insulin  aspart (NOVOLOG  FLEXPEN) 100 UNIT/ML FlexPen Inject 15 Units into the skin 3 (three) times daily with meals. 06/28/23   Angiulli, Toribio PARAS,  PA-C  insulin  glargine (LANTUS ) 100 UNIT/ML Solostar Pen Inject 38 Units into the skin 2 (two) times daily. 06/28/23   Angiulli, Toribio PARAS, PA-C  Insulin  Pen Needle 32G X 4 MM MISC Use as directed up to four times daily 06/28/23   Angiulli, Toribio PARAS, PA-C  isoniazid  (NYDRAZID ) 300 MG tablet Take 1 tablet (300 mg total) by mouth daily. 06/28/23   Angiulli, Toribio PARAS, PA-C  loratadine  (CLARITIN ) 10 MG tablet Take 10 mg by mouth daily.    [provider]  mirtazapine (REMERON) 15 MG tablet Take 15 mg by mouth at bedtime. 06/29/23   [provider]  oxyCODONE -acetaminophen  (PERCOCET/ROXICET) 5-325 MG tablet Take 1 tablet by mouth every 8 (eight) hours as needed for severe pain (pain score 7-10). 09/21/23   Midge Golas, MD  pantoprazole  (PROTONIX ) 40 MG tablet Take 1 tablet (40 mg total) by mouth daily. 06/28/23   Angiulli, Toribio PARAS, PA-C  potassium chloride  SA (KLOR-CON  M) 20 MEQ tablet Take 1 tablet (20 mEq total) by mouth daily. 06/28/23   Angiulli, Toribio PARAS, PA-C  prazosin  (MINIPRESS ) 2 MG capsule Take 1 capsule (2 mg total) by mouth at bedtime. 06/28/23   Angiulli, Toribio PARAS, PA-C  pyridoxine  (B-6) 100 MG tablet Take 1 tablet (100 mg total) by mouth daily. 06/28/23   Angiulli, Toribio PARAS, PA-C  rosuvastatin  (CRESTOR ) 20 MG tablet Take 1 tablet (20  mg total) by mouth daily. 06/28/23   Angiulli, Toribio PARAS, PA-C  sacubitril -valsartan  (ENTRESTO ) 97-103 MG Take 1 tablet by mouth 2 (two) times daily. 06/28/23   Angiulli, Toribio PARAS, PA-C  spironolactone  (ALDACTONE ) 25 MG tablet Take 1 tablet (25 mg total) by mouth daily. 06/28/23   Angiulli, Toribio PARAS, PA-C  traZODone  (DESYREL ) 100 MG tablet Take 3 tablets (300 mg total) by mouth at bedtime. 06/28/23   Pegge Toribio PARAS, PA-C     Critical care time: 30 min

## 2024-01-05 NOTE — Progress Notes (Signed)
 Heart Failure Navigator Progress Note  Assessed for Heart & Vascular TOC clinic readiness.  Patient does not meet criteria due to Advanced Heart Failure Team patient of Dr. Mclean. No HF TOC.   Navigator will sign off at this time.   Stephane Haddock, BSN, Scientist, clinical (histocompatibility and immunogenetics) Only

## 2024-01-05 NOTE — Progress Notes (Signed)
 Progress Note  Patient Name: Luis [name] Date of Encounter: 01/05/2024  Primary Cardiologist:   Alm Clay, MD   Subjective   Intubated, sedated.   Inpatient Medications    Scheduled Meds:  Chlorhexidine  Gluconate Cloth  6 each Topical Q0600   docusate  100 mg Per Tube BID   enoxaparin  (LOVENOX ) injection  60 mg Subcutaneous Q24H   fentaNYL  (SUBLIMAZE ) injection  25-50 mcg Intravenous Once   furosemide   80 mg Intravenous BID   insulin  aspart  0-15 Units Subcutaneous Q4H   mupirocin  ointment  1 Application Nasal BID   mouth rinse  15 mL Mouth Rinse Q2H   pantoprazole  (PROTONIX ) IV  40 mg Intravenous QHS   polyethylene glycol  17 g Per Tube Daily   Continuous Infusions:  fentaNYL  infusion INTRAVENOUS 100 mcg/hr (01/05/24 0900)   propofol  (DIPRIVAN ) infusion 40 mcg/kg/min (01/05/24 0900)   PRN Meds: acetaminophen , docusate, fentaNYL , mouth rinse, polyethylene glycol   Vital Signs    Vitals:   01/05/24 0800 01/05/24 0815 01/05/24 0830 01/05/24 0845  BP: 138/73  (!) 143/79   Pulse: 86 87 90 87  Resp: (!) 22 (!) 22 (!) 21 (!) 21  Temp: 99.9 F (37.7 C) 100 F (37.8 C) 100 F (37.8 C) 100 F (37.8 C)  TempSrc:      SpO2: 100% 100% 100% 100%  Weight:        Intake/Output Summary (Last 24 hours) at 01/05/2024 0906 Last data filed at 01/05/2024 0900 Gross per 24 hour  Intake 890.7 ml  Output 2100 ml  Net -1209.3 ml   Filed Weights   01/04/24 1339 01/05/24 0500  Weight: 125.6 kg 90.4 kg    Telemetry    NSR, rare ectopy - Personally Reviewed  ECG    NA - Personally Reviewed  Physical Exam   GEN: No acute distress.   Neck: No  JVD Cardiac: RRR, no murmurs, rubs, or gallops.  Respiratory:     Decreased breath sounds GI: Soft, nontender, non-distended  MS: No  edema; No deformity. Neuro:  Nonfocal  Psych: Normal affect   Labs    Chemistry Recent Labs  Lab 01/04/24 1510 01/04/24 1630 01/04/24 1804 01/04/24 2307 01/05/24 0003  01/05/24 0614  NA 135   < >  --  136 136 138  K 4.0   < >  --  4.0 4.0 3.8  CL 104  --   --   --  102 104  CO2 20*  --   --   --  24 26  GLUCOSE 373*  --   --   --  357* 182*  BUN 13  --   --   --  15 11  CREATININE 0.93  --   --   --  1.02 1.05  CALCIUM  8.1*  --   --   --  8.4* 8.3*  PROT  --   --  6.0*  --   --   --   ALBUMIN  --   --  2.4*  --   --   --   AST  --   --  29  --   --   --   ALT  --   --  18  --   --   --   ALKPHOS  --   --  57  --   --   --   BILITOT  --   --  0.4  --   --   --  GFRNONAA >60  --   --   --  >60 >60  ANIONGAP 11  --   --   --  10 8   < > = values in this interval not displayed.     Hematology Recent Labs  Lab 01/04/24 1308 01/04/24 1333 01/04/24 1754 01/04/24 2307 01/05/24 0851  WBC 10.4  --   --   --  11.5*  RBC 4.86  --   --   --  4.66  HGB 14.0   < > 13.6 12.6* 13.6  HCT 43.8   < > 40.0 37.0* 42.8  MCV 90.1  --   --   --  91.8  MCH 28.8  --   --   --  29.2  MCHC 32.0  --   --   --  31.8  RDW 13.2  --   --   --  13.2  PLT 259  --   --   --  191   < > = values in this interval not displayed.    Cardiac EnzymesNo results for input(s): TROPONINI in the last 168 hours. No results for input(s): TROPIPOC in the last 168 hours.   BNP Recent Labs  Lab 01/04/24 1510  BNP 601.5*     DDimer No results for input(s): DDIMER in the last 168 hours.   Radiology    ECHOCARDIOGRAM COMPLETE Result Date: 01/05/2024    ECHOCARDIOGRAM REPORT   Patient Name:   Luis [name] Date of Exam: 01/05/2024 Medical Rec #:  [id number]             Height:       65.0 in Accession #:    [id number]            Weight:       199.3 lb Date of Birth:  22-Jun-1971              BSA:          1.975 m Patient Age:    53 years              BP:           122/66 mmHg Patient Gender: M                     HR:           82 bpm. Exam Location:  Inpatient Procedure: 2D Echo, Cardiac Doppler and Color Doppler (Both Spectral and Color            Flow Doppler were utilized  during procedure). Indications:    Congestive Heart Failure I50.9  History:        Patient has prior history of Echocardiogram examinations, most                 recent 06/14/2023. CHF and Cardiomyopathy, Defibrillator; Risk                 Factors:Hypertension and Diabetes.  Sonographer:    Jayson Gaskins Referring Phys: [id number] KHABIB DGAYLI IMPRESSIONS  1. Left ventricular ejection fraction, by estimation, is <20%. The left ventricle has severely decreased function. The left ventricle demonstrates global hypokinesis. Left ventricular diastolic parameters are consistent with Grade II diastolic dysfunction (pseudonormalization).  2. Right ventricular systolic function is normal. The right ventricular size is normal.  3. The mitral valve is normal in structure. Mild mitral valve regurgitation. No evidence of mitral stenosis.  4. The aortic valve is tricuspid. Aortic valve  regurgitation is not visualized. No aortic stenosis is present.  5. The inferior vena cava is dilated in size with <50% respiratory variability, suggesting right atrial pressure of 15 mmHg. FINDINGS  Left Ventricle: Left ventricular ejection fraction, by estimation, is <20%. The left ventricle has severely decreased function. The left ventricle demonstrates global hypokinesis. The left ventricular internal cavity size was normal in size. There is no  left ventricular hypertrophy. Left ventricular diastolic parameters are consistent with Grade II diastolic dysfunction (pseudonormalization). Right Ventricle: The right ventricular size is normal. Right ventricular systolic function is normal. Left Atrium: Left atrial size was normal in size. Right Atrium: Right atrial size was normal in size. Pericardium: Trivial pericardial effusion is present. Mitral Valve: The mitral valve is normal in structure. Mild mitral valve regurgitation. No evidence of mitral valve stenosis. Tricuspid Valve: The tricuspid valve is normal in structure. Tricuspid valve  regurgitation is not demonstrated. No evidence of tricuspid stenosis. Aortic Valve: The aortic valve is tricuspid. Aortic valve regurgitation is not visualized. No aortic stenosis is present. Aortic valve mean gradient measures 5.0 mmHg. Aortic valve peak gradient measures 8.5 mmHg. Aortic valve area, by VTI measures 1.82 cm. Pulmonic Valve: The pulmonic valve was grossly normal. Pulmonic valve regurgitation is not visualized. No evidence of pulmonic stenosis. Aorta: The aortic root is normal in size and structure. Venous: The inferior vena cava is dilated in size with less than 50% respiratory variability, suggesting right atrial pressure of 15 mmHg. IAS/Shunts: No atrial level shunt detected by color flow Doppler. Additional Comments: A device lead is visualized.  LEFT VENTRICLE PLAX 2D LVIDd:         5.00 cm   Diastology LVIDs:         4.30 cm   LV e' medial:    7.51 cm/s LV PW:         1.30 cm   LV E/e' medial:  12.6 LV IVS:        0.90 cm   LV e' lateral:   4.57 cm/s LVOT diam:     1.90 cm   LV E/e' lateral: 20.8 LV SV:         45 LV SV Index:   23 LVOT Area:     2.84 cm  RIGHT VENTRICLE             IVC RV S prime:     12.70 cm/s  IVC diam: 2.80 cm TAPSE (M-mode): 1.9 cm LEFT ATRIUM           Index        RIGHT ATRIUM           Index LA Vol (A2C): 43.3 ml 21.92 ml/m  RA Area:     19.50 cm LA Vol (A4C): 27.7 ml 14.02 ml/m  RA Volume:   52.10 ml  26.38 ml/m  AORTIC VALVE AV Area (Vmax):    1.84 cm AV Area (Vmean):   2.02 cm AV Area (VTI):     1.82 cm AV Vmax:           146.00 cm/s AV Vmean:          111.000 cm/s AV VTI:            0.245 m AV Peak Grad:      8.5 mmHg AV Mean Grad:      5.0 mmHg LVOT Vmax:         95.00 cm/s LVOT Vmean:        79.000 cm/s  LVOT VTI:          0.157 m LVOT/AV VTI ratio: 0.64  AORTA Ao Root diam: 2.80 cm MITRAL VALVE MV Area (PHT): 4.60 cm    SHUNTS MV Decel Time: 165 msec    Systemic VTI:  0.16 m MV E velocity: 94.90 cm/s  Systemic Diam: 1.90 cm MV A velocity: 73.40 cm/s MV  E/A ratio:  1.29 Redell Shallow MD Electronically signed by Redell Shallow MD Signature Date/Time: 01/05/2024/8:02:20 AM    Final    DG Abd Portable 1V Result Date: 01/04/2024 CLINICAL DATA:  Enteric tube placement EXAM: PORTABLE ABDOMEN - 1 VIEW COMPARISON:  CT abdomen and pelvis dated 12/07/2022 FINDINGS: Gastric/enteric tube tip projects over the right lower quadrant, likely related to patient positioning and projectional technique. Partially imaged ICD leads project over the right atrium, right ventricle, and tributary of the coronary sinus. Partially imaged dense left retrocardiac opacity with air bronchograms. IMPRESSION: Gastric/enteric tube tip projects over the presumed distal stomach. Electronically Signed   By: Limin  Xu M.D.   On: 01/04/2024 16:56   DG CHEST PORT 1 VIEW Result Date: 01/04/2024 CLINICAL DATA:  [id number] Endotracheally intubated [id number] EXAM: PORTABLE CHEST - 1 VIEW COMPARISON:  January 04, 2024 1:18 p.m. FINDINGS: Well-positioned endotracheal tube terminating in the mid trachea. Esophagogastric tube courses below the diaphragm with the distal tip outside the field of view. Left chest pacemaker/AICD with leads terminating in the right atrium, right ventricle, and coronary sinus. Increasing bilateral perihilar interstitial opacities with patchy airspace opacities in both lung bases. No pleural effusion or pneumothorax. Mild cardiomegaly. IMPRESSION: 1. Well-positioned endotracheal tube terminating in the mid trachea. No pneumothorax. While the esophagogastric tube courses below the diaphragm, the tip is not included in the field of view. 2. Cardiomegaly with findings of worsening pulmonary edema. Electronically Signed   By: Rogelia Myers M.D.   On: 01/04/2024 15:25   DG Chest Port 1 View Result Date: 01/04/2024 CLINICAL DATA:  Dyspnea. EXAM: PORTABLE CHEST 1 VIEW COMPARISON:  09/15/2023. FINDINGS: Stable cardiomegaly. Stable left chest wall multi lead pacing device. Bilateral perihilar  interstitial prominence, most pronounced at the lung bases, is suggestive of pulmonary vascular congestion with possible mild pulmonary interstitial edema. No focal consolidation, pleural effusion, or pneumothorax. No acute osseous abnormality. IMPRESSION: Cardiomegaly with pulmonary vascular congestion and possible mild pulmonary interstitial edema. Electronically Signed   By: Harrietta Sherry M.D.   On: 01/04/2024 13:48    Cardiac Studies   See echo above.   Patient Profile     53 y.o. male with a hx of hypertension, hyperlipidemia, diabetes, cocaine use, chronic HFrEF, history of VT s/p Medtronic BiV ICD, tobacco use, OSA on CPAP, anxiety, depression who is being seen 01/04/2024 for the evaluation of acute on chronic HFrEF at the request of Dr. Isadora.   Assessment & Plan    Acute systolic HF:   Net negative at least 1200 ml.  Oxygenating well and weaning vent.  BP is controlled. Will begin to titrate PO meds when extubated.   We don't think he was taking any of his previous meds and I don't see any follow up with cardiology outside of our system for HF.  He does follow at the [facility] but it does not look like he has cardiac follow up.  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  He had no CAD on cath.    ICD:   Was a no show for last check.  We will make sure he has an interrogation in the chart since he was a no show for most recent follow up.   He had a previous history of VT.    Sleep apnea:    He has had CPAP in the past and we can try to verify use.    DVT:  He had a history of upper extremity DVT and was on Eliquis  but apparently not compliant.  It looks like this was provoked so I would suggest no need for ongoing therapy.   Jehovah's Witness  For questions or updates, please contact [facility] Please consult [email or web address] for contact info under Cardiology/STEMI.   Signed, Lynwood Schilling, MD  01/05/2024, 9:06 AM

## 2024-01-05 NOTE — Progress Notes (Signed)
 Pharmacy Antibiotic Note  Luis [name] is a 53 y.o. male admitted on 01/04/2024 with sepsis.  Pharmacy has been consulted for vancomycin  dosing. Pt intubated yesterday and has developed fever overnight. Procalcitonin up0.13>1.22  Plan: Vancomycin  2000mg  IV once then 500mg  IV q12 hours (eAUC 447, Vd 0.5, sCr 1.05) Zosyn  3.375G IV q8 hours (4 hour infusion) Monitor clinical progression and levels as appropriate    Height: 5' 5 (165.1 cm) Weight: 90.4 kg (199 lb 4.7 oz) IBW/kg (Calculated) : 61.5  Temp (24hrs), Avg:99.6 F (37.6 C), Min:97.7 F (36.5 C), Max:100.9 F (38.3 C)  Recent Labs  Lab 01/04/24 1308 01/04/24 1510 01/04/24 1804 01/05/24 0003 01/05/24 0614 01/05/24 0851 01/05/24 0958  WBC 10.4  --   --   --   --  11.5* 11.2*  CREATININE 0.79 0.93  --  1.02 1.05  --   --   LATICACIDVEN  --  1.4 1.9  --   --   --   --     Estimated Creatinine Clearance: 82.2 mL/min (by C-G formula based on SCr of 1.05 mg/dL).    Allergies  Allergen Reactions   Aspirin Anaphylaxis and Other (See Comments)    Throat closing    Iodine-131 Hives   Peanut (Diagnostic) Anaphylaxis    Throat closes   Iodinated Contrast Media Hives    Can be premedicated    Latex Hives and Rash   Penicillins Nausea And Vomiting   Ultram  [Tramadol ] Hives and Nausea Only   Zestril [Lisinopril] Cough   Cinnamon    Amoxicillin-Pot Clavulanate Diarrhea   Lidocaine  Rash    Rash from adhesive on lidocaine  patches     Antimicrobials this admission: Vancomycin  7/9>> Zosyn  7/9>>  Microbiology results: 7/8 MRSA PCR (+) 7/8 BCX pending 7/9 Resp pending  Thank you for allowing pharmacy to be a part of this patient's care.  Koren Or, PharmD Clinical Pharmacist 01/05/2024 1:12 PM Please check AMION for all [facility] numbers

## 2024-01-05 NOTE — Plan of Care (Signed)
  Problem: Clinical Measurements: Goal: Ability to maintain clinical measurements within normal limits will improve Outcome: Progressing Goal: Will remain free from infection Outcome: Progressing Goal: Diagnostic test results will improve Outcome: Progressing Goal: Respiratory complications will improve Outcome: Progressing Goal: Cardiovascular complication will be avoided Outcome: Progressing   Problem: Activity: Goal: Risk for activity intolerance will decrease Outcome: Progressing   Problem: Coping: Goal: Level of anxiety will decrease Outcome: Progressing   Problem: Elimination: Goal: Will not experience complications related to bowel motility Outcome: Progressing Goal: Will not experience complications related to urinary retention Outcome: Progressing   Problem: Pain Managment: Goal: General experience of comfort will improve and/or be controlled Outcome: Progressing   Problem: Safety: Goal: Ability to remain free from injury will improve Outcome: Progressing   Problem: Skin Integrity: Goal: Risk for impaired skin integrity will decrease Outcome: Progressing   Problem: Education: Goal: Ability to describe self-care measures that may prevent or decrease complications (Diabetes Survival Skills Education) will improve Outcome: Progressing Goal: Individualized Educational Video(s) Outcome: Progressing   Problem: Fluid Volume: Goal: Ability to maintain a balanced intake and output will improve Outcome: Progressing   Problem: Health Behavior/Discharge Planning: Goal: Ability to identify and utilize available resources and services will improve Outcome: Progressing Goal: Ability to manage health-related needs will improve Outcome: Progressing   Problem: Metabolic: Goal: Ability to maintain appropriate glucose levels will improve Outcome: Progressing   Problem: Nutritional: Goal: Progress toward achieving an optimal weight will improve Outcome: Progressing    Problem: Skin Integrity: Goal: Risk for impaired skin integrity will decrease Outcome: Progressing   Problem: Tissue Perfusion: Goal: Adequacy of tissue perfusion will improve Outcome: Progressing   Problem: Safety: Goal: Non-violent Restraint(s) Outcome: Progressing

## 2024-01-05 NOTE — TOC Initial Note (Signed)
 Transition of Care [facility]) - Initial/Assessment Note    Patient Details  Name: Luis [name] MRN: [id number] Date of Birth: 15-Oct-1970  [facility] CM/SW Contact:    Lauraine FORBES Saa, LCSW Phone Number: 01/05/2024, 9:54 AM  Clinical Narrative:                  9:55 AM CSW introduced self and role to patient's relative/former spouse, Heron (patient is currently intubated). Heron confirmed patient resides at home alone where he receives [facility] every day for eight hours each day. Heron confirmed that she could provide transportation and/or home support for patient if needed upon discharge. Heron confirmed patient's SNF/HH/DME history. Per chart review, patient has SNF history at [facility]. Patient has HH history with [facility], and [facility]. Patient has DME (electric wheelchair, rolling walker, rollator, shower seat, walker, cane, CPAP) history with [facility], [facility], and [facility]. Per chart review, patient has history with [facility]. Patient's preferred pharmacy's are [name] and [facility] [phone] [place].   Expected Discharge Plan: Home/Self Care Barriers to Discharge: Continued Medical Work up   Patient Goals and CMS Choice            Expected Discharge Plan and Services       Living arrangements for the past 2 months: Apartment                                      Prior Living Arrangements/Services Living arrangements for the past 2 months: Apartment Lives with:: Self Patient language and need for interpreter reviewed:: Yes        Need for Family Participation in Patient Care: Yes (Comment) Care giver support system in place?: Yes (comment)   Criminal Activity/Legal Involvement Pertinent to Current Situation/Hospitalization: No - Comment as needed  Activities of Daily Living      Permission Sought/Granted Permission sought to share information with : Family Supports Permission granted to share  information with : No (Contact information on chart)  Share Information with NAME: Heron Parkins     Permission granted to share info w Relationship: Relative/Former Spouse  Permission granted to share info w Contact Information: [phone]  Emotional Assessment Appearance:: Appears stated age Attitude/Demeanor/Rapport: Unable to Assess, Intubated (Following Commands or Not Following Commands) Affect (typically observed): Unable to Assess   Alcohol  / Substance Use: Not Applicable Psych Involvement: No (comment)  Admission diagnosis:  Respiratory failure with hypoxia (HCC) [J96.91] Patient Active Problem List   Diagnosis Date Noted   Respiratory failure with hypoxia (HCC) 01/04/2024   Flash pulmonary edema (HCC) 01/04/2024   Toxic metabolic encephalopathy 01/04/2024   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   Conversion reaction 06/15/2023   History of stroke 12/05/2022   MDD (major depressive disorder), 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/04/2022   Cocaine use 12/04/2022   Weakness 10/25/2022   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   Acute heart failure with reduced ejection fraction (HFrEF, <= 40%) (HCC) 04/16/2022   Fall 03/24/2022   Biventricular ICD (implantable cardioverter-defibrillator) in place    Lightheadedness    Acute on chronic combined systolic and diastolic CHF (congestive heart failure) (HCC) 02/26/2022   Cocaine abuse (HCC) 02/26/2022   Chronic respiratory failure with hypoxia (HCC) - 2.5 L/min 02/26/2022   Sleep apnea 08/07/2021   MDD (major depressive disorder), recurrent severe, 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   Moderate episode of recurrent major depressive disorder (HCC)  05/08/2021   Generalized anxiety disorder 05/08/2021   Unspecified mood (affective) disorder (HCC) 05/08/2021   Insomnia 05/08/2021   Cocaine use disorder, moderate, dependence (HCC) 04/02/2021   Right arm weakness 01/28/2021   Right sided weakness 01/28/2021   Non-ischemic  cardiomyopathy (HCC)    Acute respiratory failure with hypoxia and hypercapnia (HCC) 05/05/2019   Morbid obesity (HCC) 05/05/2019   Chronic systolic CHF (congestive heart failure) (HCC) 01/17/2019   HLD (hyperlipidemia)    Diabetes (HCC)    Hypertension    GERD (gastroesophageal reflux disease)    Tobacco abuse    PCP:  Clinic, [name] Pharmacy:   [id number] [place], [place] - [address] AT [facility] OF [address] & [facility] [address] [place] [address] Phone: [phone] Fax: [phone]  [name] [address] [place] [address] Phone: [phone] Fax: [phone]     Social Drivers of Health (SDOH) Social History: SDOH Screenings   Food Insecurity: Low Risk  (09/16/2023)   Received from [facility]  Housing: Low Risk  (09/16/2023)   Received from [facility]  Transportation Needs: No Transportation Needs (09/16/2023)   Received from [facility]  Utilities: Low Risk  (09/16/2023)   Received from [facility]  Alcohol  Screen: Low Risk  (12/04/2022)  Depression (PHQ2-9): Medium Risk (11/16/2022)  Financial Resource Strain: High Risk (11/16/2022)  Social Connections: Unknown (08/26/2022)   Received from [facility]  Stress: No Stress Concern Present (04/25/2023)   Received from [facility]  Tobacco Use: High Risk (01/04/2024)   SDOH Interventions:     Readmission Risk Interventions     No data to display

## 2024-01-05 NOTE — Progress Notes (Signed)
 Initial Nutrition Assessment  DOCUMENTATION CODES:   Non-severe (moderate) malnutrition in context of chronic illness, Obesity unspecified (heart failure, cocaine abuse)  INTERVENTION:   Initiate tube feeding via OG: begin at 65ml/h and increase 10ml every 12 h until goal rate of 76ml/h is reached Osmolite 1.5 at 50 ml/h (1200 ml per day)  Prosource TF20 60 ml daily  Provides 1880 kcal, 95 gm protein, 914 ml free water daily  Pt is at HIGH risk for refeeding syndrome given unknown diet hx and extensive wt loss. Monitor magnesium  and phosphorus daily x 2 days, MD to replete as needed. 100mg  thiamine  x 5 days MVI w/ minerals daily  NUTRITION DIAGNOSIS:   Moderate Malnutrition related to chronic illness (heart failure, cocaine abuse) as evidenced by percent weight loss, mild muscle depletion.  GOAL:   Patient will meet greater than or equal to 90% of their needs  MONITOR:   Vent status  REASON FOR ASSESSMENT:   Ventilator, Consult Assessment of nutrition requirement/status, Enteral/tube feeding initiation and management  ASSESSMENT:   Pt with hx of  HLD, type 2 diabetes, HTN, PTSD, OSA, GERD, substance abuse (cocaine), and chronic heart failure. Hx of heart cath and coronary angiograph (2023). Admitted with SOB, bradycardia, and hypotension, intubated on arrival.  Pt ventilated and sedated at time of assessment. No family or visitors at bedside during assessment. Unable to obtain any diet hx at this time. Pt has hx of uncontrolled type 2 diabetes and prescribed insulin  regimen outpatient, current A1c 13.9%. Nutrition focused physical exam shows mild to moderate muscle depletions and moderate pitting edema which could be masking further losses. No fat depletions noted on physical exam. Wt hx per chart review shows extensive wt loss in the last 6 months. Pt seen in ED 06/12/2023 and weighed 138.3kg and within 3 months, his wt decreased to 126.6kg on 09/15/23 (8% loss in 3 months)  during another ED visit. Wt upon admission 01/04/24 was recorded as 90.4kg (35% wt loss in 7 months, 29% loss in 4 months). Per chart review, pt had been prescribed trulicity  injections 06/2021 but those had been discontinued 09/2022. Unable to confirm what wt loss is attributed to but suspect there are many factors contributing including regular cocaine use, unknown dietary intake, and long term use of medications. Wt loss puts pt at high refeeding risk when enteral nutrition is initiated.   Patient is currently intubated on ventilator support MV: 9.7 L/min Temp (24hrs), Avg:99.6 F (37.6 C), Min:97.7 F (36.5 C), Max:100.9 F (38.3 C)  MAP (cuff):  Propofol : 22.6 ml/hr  Admit weight: 90.4kg  Current weight: 90.4kg   Intake/Output Summary (Last 24 hours) at 01/05/2024 1259 Last data filed at 01/05/2024 1200 Gross per 24 hour  Intake 1069.49 ml  Output 2100 ml  Net -1030.51 ml   Net IO Since Admission: -1,030.51 mL [01/05/24 1259]  Drains/Lines: OG gastric distal stomach UOP: x 24 hrs  Nutritionally Relevant Medications: Scheduled Meds:  docusate  100 mg Per Tube BID   furosemide   80 mg Intravenous BID   insulin  aspart  0-15 Units Subcutaneous Q4H   pantoprazole  (PROTONIX ) IV  40 mg Intravenous QHS   polyethylene glycol  17 g Per Tube Daily   Continuous Infusions:  fentaNYL  infusion INTRAVENOUS 100 mcg/hr (01/05/24 0900)   propofol  (DIPRIVAN ) infusion 30 mcg/kg/min (01/05/24 0900)  Propofol  22.6 ml/h which provides additional 596 kcal daily  Labs Reviewed: CBG ranges from 138-349 mg/dL over the last 24 hours HgbA1c 13.9 Potassium 3.8  Magnesium  1.7  NUTRITION - FOCUSED PHYSICAL EXAM: Muscle depletion in combination with wt loss history is indicative of malnutrition diagnosis Flowsheet Row Most Recent Value  Orbital Region No depletion  Upper Arm Region No depletion  Thoracic and Lumbar Region No depletion  Buccal Region No depletion  Temple Region Moderate  depletion  Clavicle Bone Region Mild depletion  Clavicle and Acromion Bone Region Mild depletion  Scapular Bone Region Mild depletion  Dorsal Hand Unable to assess  [edema]  Patellar Region Mild depletion  Anterior Thigh Region Mild depletion  Posterior Calf Region Unable to assess  [pitting edema bilateral]  Edema (RD Assessment) Moderate  [bilateral lower legs, pitting]  Hair Reviewed  Eyes Unable to assess  [sedated]  Mouth Unable to assess  [vent and OG]  Skin Reviewed  Nails Reviewed    Diet Order:   Diet Order             Diet NPO time specified  Diet effective now                   EDUCATION NEEDS:   Not appropriate for education at this time  Skin:  Skin Assessment: Reviewed RN Assessment  Last BM:  PTA  Height:   Ht Readings from Last 1 Encounters:  01/05/24 5' 5 (1.651 m)   Weight:   Wt Readings from Last 1 Encounters:  01/05/24 90.4 kg    Ideal Body Weight:  61.8 kg  BMI:  Body mass index is 33.16 kg/m.  Estimated Nutritional Needs:   Kcal:  1800-2000  Protein:  90-105g  Fluid:  1.8-2.0L   Josette Glance, MS, RDN, LDN Clinical Dietitian I Please reach out via secure chat

## 2024-01-05 NOTE — Progress Notes (Signed)
 [facility] ADULT ICU REPLACEMENT PROTOCOL   The patient does apply for the [facility] Adult ICU Electrolyte Replacment Protocol based on the criteria listed below:   1.Exclusion criteria: TCTS, ECMO, Dialysis, and Myasthenia Gravis patients 2. Is GFR >/= 30 ml/min? Yes.    Patient's GFR today is >60 3. Is SCr </= 2? Yes.   Patient's SCr is 1.02 mg/dL 4. Did SCr increase >/= 0.5 in 24 hours? No. 5.Pt's weight >40kg  Yes.   6. Abnormal electrolyte(s): mag 1.7  7. Electrolytes replaced per protocol 8.  Call MD STAT for K+ </= 2.5, Phos </= 1, or Mag </= 1 Physician:  protocol  Claretta JINNY Sharps 01/05/2024 6:11 AM

## 2024-01-06 ENCOUNTER — Inpatient Hospital Stay (HOSPITAL_COMMUNITY)

## 2024-01-06 DIAGNOSIS — I5023 Acute on chronic systolic (congestive) heart failure: Secondary | ICD-10-CM

## 2024-01-06 DIAGNOSIS — J9601 Acute respiratory failure with hypoxia: Secondary | ICD-10-CM | POA: Diagnosis not present

## 2024-01-06 DIAGNOSIS — J690 Pneumonitis due to inhalation of food and vomit: Secondary | ICD-10-CM | POA: Diagnosis not present

## 2024-01-06 DIAGNOSIS — I5021 Acute systolic (congestive) heart failure: Secondary | ICD-10-CM | POA: Diagnosis not present

## 2024-01-06 DIAGNOSIS — J9602 Acute respiratory failure with hypercapnia: Secondary | ICD-10-CM | POA: Diagnosis not present

## 2024-01-06 LAB — POCT I-STAT EG7
Acid-Base Excess: 5 mmol/L — ABNORMAL HIGH (ref 0.0–2.0)
Bicarbonate: 31.8 mmol/L — ABNORMAL HIGH (ref 20.0–28.0)
Calcium, Ion: 1.15 mmol/L (ref 1.15–1.40)
HCT: 44 % (ref 39.0–52.0)
Hemoglobin: 15 g/dL (ref 13.0–17.0)
O2 Saturation: 89 %
Patient temperature: 100
Potassium: 3.7 mmol/L (ref 3.5–5.1)
Sodium: 139 mmol/L (ref 135–145)
TCO2: 33 mmol/L — ABNORMAL HIGH (ref 22–32)
pCO2, Ven: 54.4 mmHg (ref 44–60)
pH, Ven: 7.379 (ref 7.25–7.43)
pO2, Ven: 62 mmHg — ABNORMAL HIGH (ref 32–45)

## 2024-01-06 LAB — BASIC METABOLIC PANEL WITH GFR
Anion gap: 7 (ref 5–15)
BUN: 10 mg/dL (ref 6–20)
CO2: 29 mmol/L (ref 22–32)
Calcium: 8.1 mg/dL — ABNORMAL LOW (ref 8.9–10.3)
Chloride: 102 mmol/L (ref 98–111)
Creatinine, Ser: 1.01 mg/dL (ref 0.61–1.24)
GFR, Estimated: >60 mL/min (ref >60)
Glucose, Bld: 189 mg/dL — ABNORMAL HIGH (ref 70–99)
Potassium: 3.5 mmol/L (ref 3.5–5.1)
Sodium: 138 mmol/L (ref 135–145)

## 2024-01-06 LAB — GLUCOSE, CAPILLARY
Glucose-Capillary: 220 mg/dL — ABNORMAL HIGH (ref 70–99)
Glucose-Capillary: 208 mg/dL — ABNORMAL HIGH (ref 70–99)
Glucose-Capillary: 208 mg/dL — ABNORMAL HIGH (ref 70–99)
Glucose-Capillary: 210 mg/dL — ABNORMAL HIGH (ref 70–99)
Glucose-Capillary: 192 mg/dL — ABNORMAL HIGH (ref 70–99)
Glucose-Capillary: 103 mg/dL — ABNORMAL HIGH (ref 70–99)
Glucose-Capillary: 103 mg/dL — ABNORMAL HIGH (ref 70–99)

## 2024-01-06 LAB — POCT I-STAT 7, (LYTES, BLD GAS, ICA,H+H)
Acid-Base Excess: 5 mmol/L — ABNORMAL HIGH (ref 0.0–2.0)
Bicarbonate: 31.8 mmol/L — ABNORMAL HIGH (ref 20.0–28.0)
Calcium, Ion: 1.15 mmol/L (ref 1.15–1.40)
HCT: 38 % — ABNORMAL LOW (ref 39.0–52.0)
Hemoglobin: 12.9 g/dL — ABNORMAL LOW (ref 13.0–17.0)
O2 Saturation: 96 %
Patient temperature: 99.5
Potassium: 4.2 mmol/L (ref 3.5–5.1)
Sodium: 139 mmol/L (ref 135–145)
TCO2: 33 mmol/L — ABNORMAL HIGH (ref 22–32)
pCO2 arterial: 54 mmHg — ABNORMAL HIGH (ref 32–48)
pH, Arterial: 7.38 (ref 7.35–7.45)
pO2, Arterial: 85 mmHg (ref 83–108)

## 2024-01-06 LAB — MAGNESIUM: Magnesium: 1.9 mg/dL (ref 1.7–2.4)

## 2024-01-06 LAB — CULTURE, RESPIRATORY W GRAM STAIN

## 2024-01-06 LAB — PHOSPHORUS: Phosphorus: 4 mg/dL (ref 2.5–4.6)

## 2024-01-06 LAB — LEGIONELLA PNEUMOPHILA SEROGP 1 UR AG: L. pneumophila Serogp 1 Ur Ag: NEGATIVE

## 2024-01-06 MED ORDER — MAGNESIUM SULFATE 2 GM/50ML IV SOLN
2.0000 g | Freq: Once | INTRAVENOUS | Status: AC
  Administered 2024-01-06: 2 g via INTRAVENOUS
  Filled 2024-01-06: qty 50

## 2024-01-06 MED ORDER — THIAMINE MONONITRATE 100 MG PO TABS
100.0000 mg | ORAL_TABLET | Freq: Every day | ORAL | Status: DC
  Administered 2024-01-07 – 2024-01-08 (×2): 100 mg
  Filled 2024-01-06 (×2): qty 1

## 2024-01-06 MED ORDER — DEXMEDETOMIDINE HCL IN NACL 400 MCG/100ML IV SOLN: 0.0000 ug/kg/h | INTRAVENOUS | Status: DC

## 2024-01-06 MED ORDER — POTASSIUM CHLORIDE 20 MEQ PO PACK: 40.0000 meq | PACK | Freq: Once | ORAL | Status: DC

## 2024-01-06 MED ORDER — SENNA 8.6 MG PO TABS: 1.0000 | ORAL_TABLET | Freq: Every day | ORAL | Status: DC | PRN

## 2024-01-06 MED ORDER — ORAL CARE MOUTH RINSE: 15.0000 mL | OROMUCOSAL | Status: DC | PRN

## 2024-01-06 MED ORDER — DEXMEDETOMIDINE HCL IN NACL 400 MCG/100ML IV SOLN
0.0000 ug/kg/h | INTRAVENOUS | Status: DC
  Administered 2024-01-06 (×2): 0.2 ug/kg/h via INTRAVENOUS
  Filled 2024-01-06: qty 100

## 2024-01-06 MED ORDER — POLYETHYLENE GLYCOL 3350 17 G PO PACK: 17.0000 g | PACK | Freq: Every day | ORAL | Status: DC | PRN

## 2024-01-06 MED ORDER — LORAZEPAM 2 MG/ML IJ SOLN
2.0000 mg | Freq: Once | INTRAMUSCULAR | Status: AC
  Administered 2024-01-06: 2 mg via INTRAVENOUS
  Filled 2024-01-06: qty 1

## 2024-01-06 MED ORDER — POTASSIUM CHLORIDE 10 MEQ/100ML IV SOLN
10.0000 meq | INTRAVENOUS | Status: AC
  Administered 2024-01-06 (×4): 10 meq via INTRAVENOUS
  Filled 2024-01-06 (×4): qty 100

## 2024-01-06 MED ORDER — DEXMEDETOMIDINE HCL IN NACL 400 MCG/100ML IV SOLN
INTRAVENOUS | Status: AC
  Administered 2024-01-06: 0.4 ug/kg/h via INTRAVENOUS
  Filled 2024-01-06: qty 100

## 2024-01-06 MED ORDER — POTASSIUM CHLORIDE 20 MEQ PO PACK
40.0000 meq | PACK | Freq: Once | ORAL | Status: AC
  Administered 2024-01-06: 40 meq
  Filled 2024-01-06: qty 2

## 2024-01-06 NOTE — Progress Notes (Addendum)
 NAME:  Luis [name], MRN:  [id number], DOB:  04-Oct-1970, LOS: 2 ADMISSION DATE:  01/04/2024  History of Present Illness:  53 year old male history of HFrEF and cocaine use presented to the hospital with shortness of breath found to have acute hypoxic/hypercapnic respiratory failure and decompensated HFrEF. Intubated in the ED due to hypoxemia, bradycardia and unresponsiveness.   Pertinent  Medical History  Tobacco use, HTN, HLD, DM, cocaine, systolic heart failure, pulm HTN  Significant Hospital Events: Including procedures, antibiotic start and stop dates in addition to other pertinent events   7/8: Admit for acute HF, acute hypoxic and hypercapnic respiratory failure. Intubated in ED 7/9: Starting Vanc and Zosyn , continues on fentanyl  and propofol , gradually weaning off vent.  Started tube feeds per RD 7/10: self-extubated,   Interim History / Subjective:  Ongoing low-grade fever overnight Self extubated this morning Foley catheter with some obstruction, this was relieved and patient put out 1.5 L rapidly Received one-time Ativan  2mg  for severe agitation Started on BiPap  Objective    Blood pressure 104/65, pulse 95, temperature 100.2 F (37.9 C), resp. rate 15, height 5' 5 (1.651 m), weight 117.1 kg, SpO2 100%.    Vent Mode: PRVC FiO2 (%):  [40 %] 40 % Set Rate:  [18 bmp-20 bmp] 18 bmp Vt Set:  [430 mL-450 mL] 430 mL PEEP:  [5 cmH20] 5 cmH20 Plateau Pressure:  [17 cmH20] 17 cmH20   Intake/Output Summary (Last 24 hours) at 01/06/2024 0809 Last data filed at 01/06/2024 0600 Gross per 24 hour  Intake 1840.55 ml  Output 5000 ml  Net -3159.45 ml   Filed Weights   01/04/24 1339 01/05/24 0500 01/06/24 0500  Weight: 125.6 kg 90.4 kg 117.1 kg    Examination: General: Laying in bed, asleep, does not open eyes to command HENT: Dry mucous membranes, normocephalic and atraumatic Lungs: Normal WOB on BiPAP Cardiovascular: RRR, normal S1/S2, no M/R/G Abdomen: Normoactive  bowel sounds, soft, nontender, nondistended Extremities: No edema to BLE Neuro: Asleep, not responding to commands, previously awake and agitated, moving all extremities spontaneously  Resolved problem list   Assessment and Plan   NEURO #Acute metabolic encephalopathy 2/2 hypercarbia, intubated and sedated Weaned off propofol  and Precedex , remains somnolent -- Stat VBG  #History of CVA December 2024 -- Prescribed statin and Plavix , no fill history on file   PULM # Acute hypercapnic and hypoxic respiratory failure # Respiratory acidosis  2/2 acute HFrEF exacerbation, likely flash pulmonary edema # History of type II pulmonary hypertension found on cath in 2023 #Hx OSA Report of sudden onset dyspnea, suspect secondary to flash pulmonary edema.  No evidence of pneumonia, pneumothorax.  Most recent ABG pH 7.38, PCO2 54, PO2 85, bicarb 31.8 Patient self extubated this a.m. 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  Report of recent cocaine use, unclear exact timing (3hr vs 3 days PTA).    Troponin 33 > 54 Echo here shows EF <20%. severely decreased LV function, G2 DD, normal RV function, normal MV, normal AV.   S/p 80mg  lasix  BID yesterday with 5L UOP Last seen by cardiology in January 2024, prescribed GDMT- last filled 12/24. -- continue aggressive diuresis with Lasix  80 mg BID -- Strict I/O's -- Daily weights -- Daily BMP,  Mg (maintain K>4, Mg>2) -- Cards will slowly titrate GDMT, BP meds once taking PO -- Consider RHC/LHC in future when stable per cards  #Hx UE DVT Appears it was a provoked DVT.  Was on Eliquis  in the past, do not see recent fills for this. Cardiology does not recommend ongoing  treatment for this   INFECTIOUS #Concern for aspiration PNA Ongoing low-grade fevers overnight to 100.  Procalcitonin 0.13 > 1.22 on repeat. LA 1.9. WCC 10.4 on admit > 11.4. Possibly 2/2 aspiration from intubation. Neg strep pneumo antigen. Bcx Ngx2 days --continue vanc/zosyn  --f/u resp culture --f/u legionella antigen --f/u blood cx --daily CBC --Tylenol  q6h PRN for fever   RENAL Renal function remains stable. Significant UOP, down 4 L net since admission -- CTM with daily BMP -- Strict I's and O's as above   HEME/ONC #Patient is a Jehovah's Witness, refuses blood products DVT prophylaxis- Lovenox    ENDOCRINE #Hx DM2, A1c 13.9 --adding Lantus  15 u daily --SSI --q4h CBG --DM education later in hospitalization     FEN/GI #Concern for malnutrition and wt loss RD following, started on tube feeds 7/9 --NPO currently  Best Practice (right click and Reselect all SmartList Selections daily)   Diet/type: NPO DVT prophylaxis LMWH Pressure ulcer(s): N/A GI prophylaxis: N/A Lines: N/A Foley:  Yes, and it is still needed Code Status:  full code Last date of multidisciplinary goals of care discussion [not yet done]  Labs   CBC: Recent Labs  Lab 01/04/24 1308 01/04/24 1333 01/04/24 1630 01/04/24 1754 01/04/24 2307 01/05/24 0851 01/05/24 0958  WBC 10.4  --   --   --   --  11.5* 11.2*  NEUTROABS  --   --   --   --   --  8.2* 8.1*  HGB 14.0   < > 14.3 13.6 12.6* 13.6 13.6  HCT 43.8   < > 42.0 40.0 37.0* 42.8 42.5  MCV 90.1  --   --   --   --  91.8 92.2  PLT 259  --   --   --   --  191 193   < > = values in this interval not displayed.    Basic Metabolic Panel: Recent Labs  Lab 01/04/24 1510 01/04/24 1630 01/04/24 2307 01/05/24 0003 01/05/24 0614 01/05/24 1613 01/05/24 1616 01/06/24 0209  NA 135   < > 136 136 138 139  --  138  K 4.0   < > 4.0 4.0 3.8 3.8  --  3.5  CL 104  --   --  102 104 103  --  102  CO2 20*  --   --  24 26 25   --  29  GLUCOSE 373*   --   --  357* 182* 139*  --  189*  BUN 13  --   --  15 11 11   --  10  CREATININE 0.93  --   --  1.02 1.05 0.99  --  1.01  CALCIUM  8.1*  --   --  8.4* 8.3* 8.2*  --  8.1*  MG 1.6*  --   --  1.7 1.7  --  1.9 1.9  PHOS  --   --   --   --   --  3.9 3.8 4.0   < > = values in this interval not displayed.   GFR: Estimated Creatinine Clearance: 97.8 mL/min (by C-G formula based on SCr of 1.01 mg/dL). Recent Labs  Lab 01/04/24 1308 01/04/24 1510 01/04/24  1634 01/04/24 1804 01/05/24 0851 01/05/24 0958  PROCALCITON  --   --  0.13  --  1.22  --   WBC 10.4  --   --   --  11.5* 11.2*  LATICACIDVEN  --  1.4  --  1.9  --   --     Liver Function Tests: Recent Labs  Lab 01/04/24 1804 01/05/24 1613  AST 29  --   ALT 18  --   ALKPHOS 57  --   BILITOT 0.4  --   PROT 6.0*  --   ALBUMIN 2.4* 2.6*   No results for input(s): LIPASE, AMYLASE in the last 168 hours. No results for input(s): AMMONIA in the last 168 hours.  ABG    Component Value Date/Time   PHART 7.333 (L) 01/04/2024 2307   PCO2ART 52.5 (H) 01/04/2024 2307   PO2ART 78 (L) 01/04/2024 2307   HCO3 32.7 (H) 01/05/2024 1827   TCO2 29 01/04/2024 2307   ACIDBASEDEF 1.0 01/04/2024 1754   O2SAT 89.1 01/05/2024 1827     Coagulation Profile: No results for input(s): INR, PROTIME in the last 168 hours.  Cardiac Enzymes: No results for input(s): CKTOTAL, CKMB, CKMBINDEX, TROPONINI in the last 168 hours.  HbA1C: Hgb A1c MFr Bld  Date/Time Value Ref Range Status  01/04/2024 04:34 PM 13.9 (H) 4.8 - 5.6 % Final    Comment:    (NOTE) Diagnosis of Diabetes The following HbA1c ranges recommended by the American Diabetes Association (ADA) may be used as an aid in the diagnosis of diabetes mellitus.  Hemoglobin             Suggested A1C NGSP%              Diagnosis  <5.7                   Non Diabetic  5.7-6.4                Pre-Diabetic  >6.4                   Diabetic  <7.0                   Glycemic  control for                       adults with diabetes.    06/13/2023 04:43 AM 12.0 (H) 4.8 - 5.6 % Final    Comment:    (NOTE) Pre diabetes:          5.7%-6.4%  Diabetes:              >6.4%  Glycemic control for   <7.0% adults with diabetes     CBG: Recent Labs  Lab 01/05/24 1456 01/05/24 1941 01/05/24 2330 01/06/24 0333 01/06/24 0720  GLUCAP 146* 162* 207* 220* 208*    Review of Systems:   none  Past Medical History:  He,  has a past medical history of Anxiety, Biventricular ICD (implantable cardioverter-defibrillator) in place, Chronic systolic CHF (congestive heart failure) (HCC), Cocaine use, Diabetes mellitus without complication (HCC), GERD (gastroesophageal reflux disease), HLD (hyperlipidemia), Hypertension, Morbid obesity (HCC), NICM (nonischemic cardiomyopathy) (HCC), OSA (obstructive sleep apnea), PTSD (post-traumatic stress disorder), Refusal of blood transfusions as patient is Jehovah's Witness, and Tobacco abuse.   Surgical History:   Past Surgical History:  Procedure Laterality Date   BIV ICD INSERTION CRT-D     FOOT FRACTURE SURGERY  ICD GENERATOR CHANGEOUT N/A 03/23/2022   Procedure: ICD GENERATOR CHANGEOUT;  Surgeon: Nancey Eulas BRAVO, MD;  Location: [facility] INVASIVE CV LAB;  Service: Cardiovascular;  Laterality: N/A;   RIGHT HEART CATH N/A 03/06/2022   Procedure: RIGHT HEART CATH;  Surgeon: Wendel Lurena POUR, MD;  Location: [facility] INVASIVE CV LAB;  Service: Cardiovascular;  Laterality: N/A;   RIGHT/LEFT HEART CATH AND CORONARY ANGIOGRAPHY N/A 04/16/2022   Procedure: RIGHT/LEFT HEART CATH AND CORONARY ANGIOGRAPHY;  Surgeon: Rolan Ezra RAMAN, MD;  Location: [facility] INVASIVE CV LAB;  Service: Cardiovascular;  Laterality: N/A;   ROTATOR CUFF REPAIR     TONSILLECTOMY       Social History:   reports that he has been smoking cigarettes. He started smoking about 17 years ago. He has a 7.5 pack-year smoking history. He has never used smokeless tobacco. He reports that  he does not currently use alcohol . He reports current drug use. Drugs: Cocaine and Marijuana.   Family History:  His family history includes Bone cancer in his father; Hypertension in his mother; Lung cancer in his father.   Allergies Allergies  Allergen Reactions   Aspirin Anaphylaxis and Other (See Comments)    Throat closing    Iodine-131 Hives   Peanut (Diagnostic) Anaphylaxis    Throat closes   Iodinated Contrast Media Hives    Can be premedicated    Latex Hives and Rash   Penicillins Nausea And Vomiting   Ultram  [Tramadol ] Hives and Nausea Only   Zestril [Lisinopril] Cough   Cinnamon    Amoxicillin-Pot Clavulanate Diarrhea   Lidocaine  Rash    Rash from adhesive on lidocaine  patches      Home Medications  Prior to Admission medications   Medication Sig Start Date End Date Taking? Authorizing Provider  bumetanide  (BUMEX ) 2 MG tablet Take 1 tablet (2 mg total) by mouth 2 (two) times daily. 06/28/23  Yes Angiulli, Toribio PARAS, PA-C  carvedilol  (COREG ) 3.125 MG tablet Take 1 tablet (3.125 mg total) by mouth 2 (two) times daily with a meal. 06/28/23  Yes Angiulli, Toribio PARAS, PA-C  cholecalciferol (VITAMIN D3) 25 MCG (1000 UNIT) tablet Take 1,000 Units by mouth daily.   Yes [provider]  clindamycin (CLEOCIN) 150 MG capsule Take 150 mg by mouth 3 (three) times daily. 12/06/23  Yes [provider]  clopidogrel  (PLAVIX ) 75 MG tablet Take 1 tablet (75 mg total) by mouth daily. 06/28/23  Yes Angiulli, Daniel J, PA-C  diclofenac  Sodium (VOLTAREN ) 1 % GEL Apply 1 Application topically 2 (two) times daily as needed (pain). 10/20/22  Yes [provider]  DULoxetine  (CYMBALTA ) 30 MG capsule Take 1 capsule (30 mg total) by mouth daily. 06/28/23  Yes Angiulli, Toribio PARAS, PA-C  gabapentin  (NEURONTIN ) 300 MG capsule Take 1 capsule (300 mg total) by mouth 2 (two) times daily. Patient taking differently: Take 300 mg by mouth at bedtime. 06/28/23  Yes Angiulli, Toribio PARAS,  PA-C  isoniazid  (NYDRAZID ) 300 MG tablet Take 1 tablet (300 mg total) by mouth daily. 06/28/23  Yes Angiulli, Toribio PARAS, PA-C  metoprolol  succinate (TOPROL -XL) 50 MG 24 hr tablet Take 150 mg by mouth daily.   Yes [provider]  mirtazapine (REMERON) 15 MG tablet Take 15 mg by mouth at bedtime as needed (sleep). 06/29/23  Yes [provider]  ondansetron  (ZOFRAN -ODT) 4 MG disintegrating tablet Take 4 mg by mouth daily as needed for nausea or vomiting.   Yes [provider]  potassium chloride  SA (KLOR-CON  M) 20  MEQ tablet Take 1 tablet (20 mEq total) by mouth daily. 06/28/23  Yes Angiulli, Toribio PARAS, PA-C  prazosin  (MINIPRESS ) 2 MG capsule Take 1 capsule (2 mg total) by mouth at bedtime. 06/28/23  Yes Angiulli, Toribio PARAS, PA-C  pyridoxine  (B-6) 100 MG tablet Take 1 tablet (100 mg total) by mouth daily. 06/28/23  Yes Angiulli, Toribio PARAS, PA-C  rosuvastatin  (CRESTOR ) 20 MG tablet Take 1 tablet (20 mg total) by mouth daily. Patient taking differently: Take 10 mg by mouth daily. 06/28/23  Yes Angiulli, Toribio PARAS, PA-C  sacubitril -valsartan  (ENTRESTO ) 97-103 MG Take 1 tablet by mouth 2 (two) times daily. 06/28/23  Yes Angiulli, Toribio PARAS, PA-C  spironolactone  (ALDACTONE ) 25 MG tablet Take 1 tablet (25 mg total) by mouth daily. 06/28/23  Yes Angiulli, Toribio PARAS, PA-C  torsemide  (DEMADEX ) 20 MG tablet Take 60 mg by mouth daily.   Yes [provider]  traZODone  (DESYREL ) 100 MG tablet Take 3 tablets (300 mg total) by mouth at bedtime. 06/28/23  Yes Angiulli, Toribio PARAS, PA-C  dapagliflozin  propanediol (FARXIGA ) 10 MG TABS tablet Take 1 tablet (10 mg total) by mouth daily. 06/28/23   Angiulli, Toribio PARAS, PA-C  digoxin  (LANOXIN ) 0.125 MG tablet Take 1 tablet (0.125 mg total) by mouth daily. 06/28/23   Angiulli, Toribio PARAS, PA-C  doxycycline  (VIBRAMYCIN ) 100 MG capsule Take 1 capsule (100 mg total) by mouth 2 (two) times daily. Patient not taking: Reported on 01/05/2024 11/10/23    Doretha Folks, MD  HYDROcodone -acetaminophen  (NORCO/VICODIN) 5-325 MG tablet Take 1 tablet by mouth every 4 (four) hours as needed. Patient not taking: Reported on 01/05/2024 11/10/23   Doretha Folks, MD  hydrOXYzine  (ATARAX ) 25 MG tablet Take 1 tablet (25 mg total) by mouth 3 (three) times daily as needed for anxiety. 09/03/21   Nwoko, Uchenna E, PA  hydrOXYzine  (VISTARIL ) 25 MG capsule Take 25 mg by mouth 3 (three) times daily. 06/29/23   [provider]  insulin  aspart (NOVOLOG  FLEXPEN) 100 UNIT/ML FlexPen Inject 15 Units into the skin 3 (three) times daily with meals. Patient taking differently: Inject 20 Units into the skin 3 (three) times daily with meals. 06/28/23   Angiulli, Toribio PARAS, PA-C  insulin  glargine (LANTUS ) 100 UNIT/ML Solostar Pen Inject 38 Units into the skin 2 (two) times daily. 06/28/23   Angiulli, Toribio PARAS, PA-C  Insulin  Pen Needle 32G X 4 MM MISC Use as directed up to four times daily 06/28/23   Angiulli, Toribio PARAS, PA-C  loratadine  (CLARITIN ) 10 MG tablet Take 10 mg by mouth daily.    [provider]  oxyCODONE -acetaminophen  (PERCOCET/ROXICET) 5-325 MG tablet Take 1 tablet by mouth every 8 (eight) hours as needed for severe pain (pain score 7-10). Patient not taking: Reported on 01/05/2024 09/21/23   Midge Golas, MD  pantoprazole  (PROTONIX ) 40 MG tablet Take 1 tablet (40 mg total) by mouth daily. 06/28/23   Angiulli, Toribio PARAS, PA-C  sacubitril -valsartan  (ENTRESTO ) 24-26 MG Take 1 tablet by mouth 2 (two) times daily. Patient not taking: Reported on 01/05/2024    [provider]     Critical care time: 30 min

## 2024-01-06 NOTE — Progress Notes (Signed)
    No new suggestions today.  Call with specific questions.  We will follow for med titration when he is taking POs.

## 2024-01-06 NOTE — Progress Notes (Signed)
 [facility] ADULT ICU REPLACEMENT PROTOCOL   The patient does apply for the [facility] Adult ICU Electrolyte Replacment Protocol based on the criteria listed below:   1.Exclusion criteria: TCTS, ECMO, Dialysis, and Myasthenia Gravis patients 2. Is GFR >/= 30 ml/min? Yes.    Patient's GFR today is >60 3. Is SCr </= 2? Yes.   Patient's SCr is 1.01 mg/dL 4. Did SCr increase >/= 0.5 in 24 hours? No. 5.Pt's weight >40kg  Yes.   6. Abnormal electrolyte(s):   K 3.5, Mg 1.9  7. Electrolytes replaced per protocol 8.  Call MD STAT for K+ </= 2.5, Phos </= 1, or Mag </= 1 Physician:  CLEMENTEEN Luis [name] Luis [name] 01/06/2024 5:55 AM

## 2024-01-06 NOTE — Progress Notes (Signed)
 Discussed with MD Dgayli about concern for urinary retention due to bloody urine post-foley removal. Bladder scan at 1800 showed 95ml urine. Advised by MD Dgayli not to execute in and out cath protocol for post void residual, instead place foley if patient meets in and out cath criteria.

## 2024-01-06 NOTE — Plan of Care (Signed)

## 2024-01-06 NOTE — Progress Notes (Signed)
 Patient self extubated. No stridor noted. No respiratory distress noted. Patient placed on 15L HFNC. Currently HR = 108, Sp02 = 93%, RR = 24.

## 2024-01-06 NOTE — Plan of Care (Signed)
  Problem: Clinical Measurements: Goal: Ability to maintain clinical measurements within normal limits will improve Outcome: Progressing Goal: Will remain free from infection Outcome: Progressing Goal: Diagnostic test results will improve Outcome: Progressing Goal: Respiratory complications will improve Outcome: Progressing Goal: Cardiovascular complication will be avoided Outcome: Progressing   Problem: Activity: Goal: Risk for activity intolerance will decrease Outcome: Progressing   Problem: Nutrition: Goal: Adequate nutrition will be maintained Outcome: Progressing   Problem: Elimination: Goal: Will not experience complications related to bowel motility Outcome: Progressing Goal: Will not experience complications related to urinary retention Outcome: Progressing   Problem: Pain Managment: Goal: General experience of comfort will improve and/or be controlled Outcome: Progressing   Problem: Safety: Goal: Ability to remain free from injury will improve Outcome: Progressing   Problem: Skin Integrity: Goal: Risk for impaired skin integrity will decrease Outcome: Progressing   Problem: Coping: Goal: Ability to adjust to condition or change in health will improve Outcome: Progressing   Problem: Fluid Volume: Goal: Ability to maintain a balanced intake and output will improve Outcome: Progressing   Problem: Skin Integrity: Goal: Risk for impaired skin integrity will decrease Outcome: Progressing   Problem: Tissue Perfusion: Goal: Adequacy of tissue perfusion will improve Outcome: Progressing

## 2024-01-06 NOTE — TOC Progression Note (Signed)
 Transition of Care [facility]) - Progression Note    Patient Details  Name: Luis [name] MRN: [id number] Date of Birth: 03-Jun-1971  Transition of Care [facility]) CM/SW Contact  Tom-Johnson, Barron Vanloan Daphne, RN Phone Number: 01/06/2024, 12:45 PM  Clinical Narrative:     Patient self-extubated today 01/06/24 and was placed on 15L HFNC. Patient is currently on BIPAP and off sedations. Continues on IV abx, IV Lasix .   Has hx of VT s/p Medtronic BiV ICD, Chronic HFrEF, HLD, HTN, DM, Cocaine abuse, OSA on CPAP, Anxiety and Depression. Cardiology following. HF team signed off.  CM spoke with patient's mother, [place] [phone]). [name] states patient's ex-wife Heron is his POA and has been making his medical decisions.prior to this admission. CM informed [name] that a POA documentation is needed. CM and RN will talk with Heron about POA when she returns to patient's bedside. [name] states patient has three older children in [place] and a minor in [place] . If Heron cannot show proof of POA, then the mother can be contacted. MD notified.  Patient not Medically ready for discharge.  CM will continue to follow for discharge needs as patient progresses with care towards discharge.        Expected Discharge Plan: Home/Self Care Barriers to Discharge: Continued Medical Work up  Expected Discharge Plan and Services       Living arrangements for the past 2 months: Apartment                                       Social Determinants of Health (SDOH) Interventions SDOH Screenings   Food Insecurity: Low Risk  (09/16/2023)   Received from [facility]  Housing: Low Risk  (09/16/2023)   Received from [facility]  Transportation Needs: No Transportation Needs (09/16/2023)   Received from [facility]  Utilities: Low Risk  (09/16/2023)   Received from [facility]  Alcohol  Screen: Low Risk  (12/04/2022)  Depression (PHQ2-9): Medium Risk (11/16/2022)  Financial Resource  Strain: High Risk (11/16/2022)  Social Connections: Unknown (08/26/2022)   Received from [facility]  Stress: No Stress Concern Present (04/25/2023)   Received from [facility]  Tobacco Use: High Risk (01/04/2024)    Readmission Risk Interventions     No data to display

## 2024-01-07 ENCOUNTER — Telehealth (HOSPITAL_COMMUNITY): Payer: Self-pay | Admitting: Pharmacy Technician

## 2024-01-07 ENCOUNTER — Other Ambulatory Visit (HOSPITAL_COMMUNITY): Payer: Self-pay

## 2024-01-07 DIAGNOSIS — J9602 Acute respiratory failure with hypercapnia: Secondary | ICD-10-CM | POA: Diagnosis not present

## 2024-01-07 DIAGNOSIS — I5021 Acute systolic (congestive) heart failure: Secondary | ICD-10-CM | POA: Diagnosis not present

## 2024-01-07 DIAGNOSIS — J690 Pneumonitis due to inhalation of food and vomit: Secondary | ICD-10-CM | POA: Diagnosis not present

## 2024-01-07 DIAGNOSIS — J9601 Acute respiratory failure with hypoxia: Secondary | ICD-10-CM | POA: Diagnosis not present

## 2024-01-07 LAB — CBC
HCT: 40.4 % (ref 39.0–52.0)
Hemoglobin: 13.3 g/dL (ref 13.0–17.0)
MCH: 29.2 pg (ref 26.0–34.0)
MCHC: 32.9 g/dL (ref 30.0–36.0)
MCV: 88.6 fL (ref 80.0–100.0)
Platelets: 205 K/uL (ref 150–400)
RBC: 4.56 MIL/uL (ref 4.22–5.81)
RDW: 12.8 % (ref 11.5–15.5)
WBC: 9.4 K/uL (ref 4.0–10.5)
nRBC: 0 % (ref 0.0–0.2)

## 2024-01-07 LAB — BASIC METABOLIC PANEL WITH GFR
Anion gap: 11 (ref 5–15)
BUN: 10 mg/dL (ref 6–20)
CO2: 28 mmol/L (ref 22–32)
Calcium: 8.3 mg/dL — ABNORMAL LOW (ref 8.9–10.3)
Chloride: 102 mmol/L (ref 98–111)
Creatinine, Ser: 0.86 mg/dL (ref 0.61–1.24)
GFR, Estimated: >60 mL/min (ref >60)
Glucose, Bld: 82 mg/dL (ref 70–99)
Potassium: 3.4 mmol/L — ABNORMAL LOW (ref 3.5–5.1)
Sodium: 141 mmol/L (ref 135–145)

## 2024-01-07 LAB — GLUCOSE, CAPILLARY
Glucose-Capillary: 94 mg/dL (ref 70–99)
Glucose-Capillary: 77 mg/dL (ref 70–99)
Glucose-Capillary: 165 mg/dL — ABNORMAL HIGH (ref 70–99)
Glucose-Capillary: 185 mg/dL — ABNORMAL HIGH (ref 70–99)
Glucose-Capillary: 270 mg/dL — ABNORMAL HIGH (ref 70–99)

## 2024-01-07 LAB — PHOSPHORUS: Phosphorus: 4.5 mg/dL (ref 2.5–4.6)

## 2024-01-07 LAB — MAGNESIUM: Magnesium: 2 mg/dL (ref 1.7–2.4)

## 2024-01-07 MED ORDER — HYDROXYZINE HCL 10 MG PO TABS
10.0000 mg | ORAL_TABLET | Freq: Three times a day (TID) | ORAL | Status: DC | PRN
  Administered 2024-01-07: 10 mg via ORAL
  Filled 2024-01-07: qty 1

## 2024-01-07 MED ORDER — POTASSIUM CHLORIDE CRYS ER 20 MEQ PO TBCR
40.0000 meq | EXTENDED_RELEASE_TABLET | Freq: Every day | ORAL | 0 refills | Status: DC
  Filled 2024-01-07: qty 60, 30d supply, fill #0

## 2024-01-07 MED ORDER — ENSURE PLUS HIGH PROTEIN PO LIQD
237.0000 mL | Freq: Two times a day (BID) | ORAL | Status: DC
  Administered 2024-01-08 – 2024-01-10 (×5): 237 mL via ORAL

## 2024-01-07 MED ORDER — ADULT MULTIVITAMIN W/MINERALS CH
1.0000 | ORAL_TABLET | Freq: Every day | ORAL | Status: DC
  Administered 2024-01-07 – 2024-01-10 (×4): 1 via ORAL
  Filled 2024-01-07 (×4): qty 1

## 2024-01-07 MED ORDER — TRAZODONE HCL 100 MG PO TABS
300.0000 mg | ORAL_TABLET | Freq: Every day | ORAL | Status: DC
  Administered 2024-01-07 – 2024-01-09 (×3): 300 mg via ORAL
  Filled 2024-01-07 (×3): qty 3

## 2024-01-07 MED ORDER — TORSEMIDE 20 MG PO TABS
40.0000 mg | ORAL_TABLET | Freq: Every day | ORAL | 0 refills | Status: DC
Start: 2024-01-07 — End: 2024-01-10
  Filled 2024-01-07: qty 60, 30d supply, fill #0

## 2024-01-07 MED ORDER — ADULT MULTIVITAMIN LIQUID CH
15.0000 mL | Freq: Every day | ORAL | Status: DC
  Filled 2024-01-07: qty 15

## 2024-01-07 MED ORDER — CHLORHEXIDINE GLUCONATE CLOTH 2 % EX PADS
6.0000 | MEDICATED_PAD | Freq: Every day | CUTANEOUS | Status: DC
  Administered 2024-01-07 – 2024-01-10 (×4): 6 via TOPICAL

## 2024-01-07 MED ORDER — SACUBITRIL-VALSARTAN 24-26 MG PO TABS
1.0000 | ORAL_TABLET | Freq: Two times a day (BID) | ORAL | 0 refills | Status: DC
  Filled 2024-01-07: qty 60, 30d supply, fill #0

## 2024-01-07 MED ORDER — ROSUVASTATIN CALCIUM 5 MG PO TABS
10.0000 mg | ORAL_TABLET | Freq: Every day | ORAL | Status: DC
  Administered 2024-01-07 – 2024-01-10 (×4): 10 mg via ORAL
  Filled 2024-01-07 (×2): qty 2
  Filled 2024-01-07: qty 1
  Filled 2024-01-07: qty 2

## 2024-01-07 MED ORDER — THIAMINE HCL 100 MG PO TABS
100.0000 mg | ORAL_TABLET | Freq: Every day | ORAL | 0 refills | Status: DC
  Filled 2024-01-07: qty 30, 30d supply, fill #0

## 2024-01-07 MED ORDER — ATROPINE SULFATE 1 % OP SOLN
1.0000 [drp] | Freq: Three times a day (TID) | OPHTHALMIC | Status: DC | PRN
  Administered 2024-01-07 – 2024-01-09 (×4): 1 [drp] via OPHTHALMIC
  Filled 2024-01-07: qty 2

## 2024-01-07 MED ORDER — SPIRONOLACTONE 25 MG PO TABS
25.0000 mg | ORAL_TABLET | Freq: Every day | ORAL | 0 refills | Status: DC
  Filled 2024-01-07: qty 30, 30d supply, fill #0

## 2024-01-07 MED ORDER — POLYETHYLENE GLYCOL 3350 17 G PO PACK: 17.0000 g | PACK | Freq: Every day | ORAL | Status: DC | PRN

## 2024-01-07 MED ORDER — SENNA 8.6 MG PO TABS: 1.0000 | ORAL_TABLET | Freq: Every day | ORAL | Status: DC | PRN

## 2024-01-07 MED ORDER — ACETAMINOPHEN 325 MG PO TABS
650.0000 mg | ORAL_TABLET | Freq: Four times a day (QID) | ORAL | Status: DC | PRN
  Administered 2024-01-09: 650 mg via ORAL
  Filled 2024-01-07: qty 2

## 2024-01-07 MED ORDER — POTASSIUM CHLORIDE 10 MEQ/100ML IV SOLN
10.0000 meq | INTRAVENOUS | Status: DC
  Administered 2024-01-07: 10 meq via INTRAVENOUS
  Filled 2024-01-07: qty 100

## 2024-01-07 MED ORDER — INSULIN ASPART 100 UNIT/ML IJ SOLN
0.0000 [IU] | Freq: Three times a day (TID) | INTRAMUSCULAR | Status: DC
  Administered 2024-01-08: 8 [IU] via SUBCUTANEOUS
  Administered 2024-01-08: 11 [IU] via SUBCUTANEOUS
  Administered 2024-01-09: 15 [IU] via SUBCUTANEOUS

## 2024-01-07 MED ORDER — POTASSIUM CHLORIDE CRYS ER 20 MEQ PO TBCR
40.0000 meq | EXTENDED_RELEASE_TABLET | ORAL | Status: AC
  Administered 2024-01-07 (×3): 40 meq via ORAL
  Filled 2024-01-07 (×3): qty 2

## 2024-01-07 MED ORDER — INSULIN ASPART 100 UNIT/ML IJ SOLN
0.0000 [IU] | Freq: Every day | INTRAMUSCULAR | Status: DC
  Administered 2024-01-08: 3 [IU] via SUBCUTANEOUS

## 2024-01-07 MED ORDER — INSULIN ASPART 100 UNIT/ML IJ SOLN: 0.0000 [IU] | Freq: Three times a day (TID) | INTRAMUSCULAR | Status: DC

## 2024-01-07 MED ORDER — SODIUM CHLORIDE 0.9 % IV SOLN
3.0000 g | Freq: Four times a day (QID) | INTRAVENOUS | Status: AC
  Administered 2024-01-07 – 2024-01-10 (×13): 3 g via INTRAVENOUS
  Filled 2024-01-07 (×13): qty 8

## 2024-01-07 MED ORDER — INSULIN ASPART 100 UNIT/ML IJ SOLN
0.0000 [IU] | Freq: Three times a day (TID) | INTRAMUSCULAR | Status: DC
  Administered 2024-01-07 (×2): 3 [IU] via SUBCUTANEOUS

## 2024-01-07 MED ORDER — PREDNISOLONE ACETATE 1 % OP SUSP
1.0000 [drp] | Freq: Four times a day (QID) | OPHTHALMIC | Status: DC | PRN
  Administered 2024-01-07 – 2024-01-09 (×5): 1 [drp] via OPHTHALMIC
  Filled 2024-01-07: qty 5

## 2024-01-07 MED ORDER — INSULIN GLARGINE-YFGN 100 UNIT/ML ~~LOC~~ SOLN: 15.0000 [IU] | Freq: Every day | SUBCUTANEOUS | Status: DC

## 2024-01-07 NOTE — Evaluation (Signed)
 Clinical/Bedside Swallow Evaluation Patient Details  Name: Luis [name] MRN: [id number] Date of Birth: 06-09-1971  Today's Date: 01/07/2024 Time: SLP Start Time (ACUTE ONLY): 1010 SLP Stop Time (ACUTE ONLY): 1030 SLP Time Calculation (min) (ACUTE ONLY): 20 min  Past Medical History:  Past Medical History:  Diagnosis Date   Anxiety    Biventricular ICD (implantable cardioverter-defibrillator) in place    Chronic systolic CHF (congestive heart failure) (HCC)    Cocaine use    Diabetes mellitus without complication (HCC)    GERD (gastroesophageal reflux disease)    HLD (hyperlipidemia)    Hypertension    Morbid obesity (HCC)    NICM (nonischemic cardiomyopathy) (HCC)    OSA (obstructive sleep apnea)    PTSD (post-traumatic stress disorder)    on Depakote    Refusal of blood transfusions as patient is Jehovah's Witness    Tobacco abuse    Past Surgical History:  Past Surgical History:  Procedure Laterality Date   BIV ICD INSERTION CRT-D     FOOT FRACTURE SURGERY     ICD GENERATOR CHANGEOUT N/A 03/23/2022   Procedure: ICD GENERATOR CHANGEOUT;  Surgeon: Mealor, Eulas BRAVO, MD;  Location: [facility] INVASIVE CV LAB;  Service: Cardiovascular;  Laterality: N/A;   RIGHT HEART CATH N/A 03/06/2022   Procedure: RIGHT HEART CATH;  Surgeon: Wendel Lurena POUR, MD;  Location: [facility] INVASIVE CV LAB;  Service: Cardiovascular;  Laterality: N/A;   RIGHT/LEFT HEART CATH AND CORONARY ANGIOGRAPHY N/A 04/16/2022   Procedure: RIGHT/LEFT HEART CATH AND CORONARY ANGIOGRAPHY;  Surgeon: Rolan Ezra RAMAN, MD;  Location: [facility] INVASIVE CV LAB;  Service: Cardiovascular;  Laterality: N/A;   ROTATOR CUFF REPAIR     TONSILLECTOMY     HPI:  53yo male admitted 01/04/24 with respiratory distress. SOB x past few days. Intubated in ED. Self extubated 01/06/24. PMH: cocaine use, CHF, CVA, DM, blind right eye, HTN, HLD, chronic HFrEF, VT s/p BiV ICD, tobacco use, OSA/CPAP, anxiety, depression, GERD, PTSD.    Assessment / Plan /  Recommendation  Clinical Impression  Pt presents with functional swallow, as best can be determined at bedside. CN exam unremarkable. Volitional cough strong. Voice quality clear. Pt reports no history of swallowing difficulty, with tolerance of regular solids/thin liquids prior to admit. Pt accepted trials of thin liquid, puree, and solid textures. No obvious oral issues or overt s/s aspiration. Pt did exhibit impulsivity during trials. RN present to provide PO meds. Pt took several large pills at once, with water. No obvious difficulty observed. Recommend regular diet/thin liquids. No ST follow up recommended at this time. Please reconsult if needs arise.  SLP Visit Diagnosis: Dysphagia, unspecified (R13.10)    Aspiration Risk  Mild aspiration risk    Diet Recommendation Regular;Thin liquid    Liquid Administration via: Straw;Cup Medication Administration: Whole meds with liquid Supervision: Patient able to self feed Compensations: Small sips/bites;Slow rate Postural Changes: Seated upright at 90 degrees;Remain upright for at least 30 minutes after po intake    Other  Recommendations Oral Care Recommendations: Oral care BID     Assistance Recommended at Discharge  Per MD  Functional Status Assessment Patient has not had a recent decline in their functional status      Prognosis Prognosis for improved oropharyngeal function: Good      Swallow Study   General Date of Onset: 01/04/24 HPI: 53yo male admitted 01/04/24 with respiratory distress. SOB x past few days. Intubated in ED. Self extubated 01/06/24. PMH: cocaine use, CHF, CVA, DM, blind right  eye, HTN, HLD, chronic HFrEF, VT s/p BiV ICD, tobacco use, OSA/CPAP, anxiety, depression, GERD, PTSD. Type of Study: Bedside Swallow Evaluation Previous Swallow Assessment: 06/13/2023 - Dys3/thin Diet Prior to this Study: NPO Temperature Spikes Noted: No Respiratory Status: Other (comment) (HFNC) History of Recent Intubation: Yes Total  duration of intubation (days): 2 days Date extubated: 01/06/24 Behavior/Cognition: Cooperative;Alert Oral Cavity Assessment: Within Functional Limits Oral Care Completed by SLP: No Oral Cavity - Dentition: Adequate natural dentition Vision: Functional for self-feeding Self-Feeding Abilities: Able to feed self Patient Positioning: Upright in bed Baseline Vocal Quality: Normal Volitional Cough: Strong Volitional Swallow: Able to elicit    Oral/Motor/Sensory Function Overall Oral Motor/Sensory Function: Within functional limits   Ice Chips Ice chips: Within functional limits Presentation: Spoon   Thin Liquid Thin Liquid: Within functional limits Presentation: Straw;Self Fed    Nectar Thick Nectar Thick Liquid: Not tested   Honey Thick Honey Thick Liquid: Not tested   Puree Puree: Within functional limits Presentation: Self Fed;Spoon   Solid     Solid: Within functional limits Presentation: Self Fed     Luis [name] B. Dory, MSP, CCC-SLP Speech Language Pathologist Office: [phone]  Dory Caprice Daring 01/07/2024,10:39 AM

## 2024-01-07 NOTE — Progress Notes (Signed)
 Nutrition Follow-up  DOCUMENTATION CODES:  Non-severe (moderate) malnutrition in context of chronic illness, Obesity unspecified (heart failure, cocaine abuse)  INTERVENTION:  Continue diet texture as recommended per SLP Recommend Carnation Breakfast Essentials TID, each packet mixed with 8 ounces of 2% milk provides 13 grams of protein and 260 calories to ensure adequate oral intake following diet advancement Monitor oral intake and adjust nutrition recommendations as appropriate  NUTRITION DIAGNOSIS:  Moderate Malnutrition related to chronic illness (heart failure, cocaine abuse) as evidenced by percent weight loss, mild muscle depletion. - remains applicable  GOAL:  Patient will meet greater than or equal to 90% of their needs - addressing via diet advancement  MONITOR:  Vent status  REASON FOR ASSESSMENT:  Ventilator, Consult Assessment of nutrition requirement/status, Enteral/tube feeding initiation and management  ASSESSMENT:  Pt with hx of  HLD, type 2 diabetes, HTN, PTSD, OSA, GERD, substance abuse (cocaine), and chronic heart failure. Hx of heart cath and coronary angiograph (2023). Admitted with SOB, bradycardia, and hypotension, intubated on arrival.  Patient discussed in rounds.  Remains oriented to self only. Sedation off and mental status returning to baseline.   Self extubated yesterday.  S/p SLP bedside swallow evaluation today. No obvious oral issues or overt s/s aspiration noted. Recommended patient for regular textured, thin liquid diet.   Diet had just been advanced, therefore unable to assess adequacy of oral intake. Will add nutrition supplements with meals and monitor intake.   Admit weight: 125.6 kg Current weight: 117.1 kg  Medications: lasix  80mg  BID, SSI 0-15 units TID, MVI, thiamine   Labs:  Potassium 3.4 CBG's 77-165 x24 hours  Diet Order:   Diet Order             Diet heart healthy/carb modified Room service appropriate? Yes; Fluid  consistency: Thin  Diet effective now                   EDUCATION NEEDS:   Not appropriate for education at this time  Skin:  Skin Assessment: Reviewed RN Assessment  Last BM:  7/11 type 5 large  Height:   Ht Readings from Last 1 Encounters:  01/07/24 5' 5 (1.651 m)    Weight:   Wt Readings from Last 1 Encounters:  01/07/24 114.4 kg    Ideal Body Weight:  61.8 kg  BMI:  Body mass index is 41.97 kg/m.  Estimated Nutritional Needs:   Kcal:  1800-2000  Protein:  90-105g  Fluid:  1.8-2.0L  Royce Maris, RDN, LDN Clinical Nutrition See AMiON for contact information.

## 2024-01-07 NOTE — Progress Notes (Signed)
   01/07/24 1700  BiPAP/CPAP/SIPAP  BiPAP/CPAP/SIPAP Resmed  Mask Type Full face mask  Mask Size Medium  EPAP  (Auto 6-20)  FiO2 (%)  (2 lpm 02 Bleed in)  Patient Home Machine No  Patient Home Mask No  Patient Home Tubing No  Auto Titrate Yes  Minimum cmH2O 6 cmH2O  Maximum cmH2O 20 cmH2O

## 2024-01-07 NOTE — Inpatient Diabetes Management (Signed)
 Inpatient Diabetes Program Recommendations  AACE/ADA: New Consensus Statement on Inpatient Glycemic Control (2015)  Target Ranges:  Prepandial:   less than 140 mg/dL      Peak postprandial:   less than 180 mg/dL (1-2 hours)      Critically ill patients:  140 - 180 mg/dL   Lab Results  Component Value Date   GLUCAP 165 (H) 01/07/2024   HGBA1C 13.9 (H) 01/04/2024    Review of Glycemic Control  Latest Reference Range & Units 01/06/24 13:40 01/06/24 15:14 01/06/24 19:26 01/06/24 23:17 01/07/24 03:26 01/07/24 07:34 01/07/24 11:12  Glucose-Capillary 70 - 99 mg/dL 789 (H) 807 (H) 896 (H) 103 (H) 94 77 165 (H)  (H): Data is abnormally high Diabetes history: Type 2 DM Outpatient Diabetes medications: Farxiga  10 mg every day, Lantus  38 units BID, Novolog  20 units TID Current orders for Inpatient glycemic control: Novolog  0-15 units TID  Inpatient Diabetes Program Recommendations:    Consider adding Novolog  70/30 10 units BID.   Spoke with patient regarding outpatient diabetes management. Patient admits to forgetting to take doses.  Reviewed patient's current A1c of 13.9%. Explained what a A1c is and what it measures. Also reviewed goal A1c with patient, importance of good glucose control @ home, and blood sugar goals. Reviewed patho of DM, need for improved control, risks for hyperglycemia, current inpatient trends, vascular changes and commorbidities.  Admits to drug use and states that this has made it extremely difficult to control. Reviewed setting alarms on this phone. Has used Dexcom in the past; sensor provided.  Patient is interested in reducing number of injections to help with compliance. Reviewed 70/30 and importance of taking with meals. Secure chat sent to MD.   Thanks, Tinnie Minus, MSN, RNC-OB Diabetes Coordinator [phone] (8a-5p)

## 2024-01-07 NOTE — Progress Notes (Signed)
 Progress Note  Patient Name: Luis [name] Date of Encounter: 01/07/2024  Primary Cardiologist:   Alm Clay, MD   Subjective   He refuses to answer questions.  Wants to leave AMA.  Inpatient Medications    Scheduled Meds:  Chlorhexidine  Gluconate Cloth  6 each Topical Daily   enoxaparin  (LOVENOX ) injection  60 mg Subcutaneous Q24H   feeding supplement (PROSource TF20)  60 mL Per Tube Daily   furosemide   80 mg Intravenous BID   insulin  aspart  0-15 Units Subcutaneous Q4H   insulin  glargine-yfgn  15 Units Subcutaneous Daily   multivitamin  15 mL Per Tube Daily   mupirocin  ointment  1 Application Nasal BID   thiamine   100 mg Per Tube Daily   Continuous Infusions:  dexmedetomidine  (PRECEDEX ) IV infusion 0.2 mcg/kg/hr (01/07/24 0700)   piperacillin -tazobactam (ZOSYN )  IV 12.5 mL/hr at 01/07/24 0700   vancomycin  Stopped (01/06/24 2313)   PRN Meds: acetaminophen , mouth rinse, polyethylene glycol, senna   Vital Signs    Vitals:   01/07/24 0530 01/07/24 0600 01/07/24 0700 01/07/24 0735  BP: 129/81 121/68 125/69   Pulse: 78 69 68   Resp: 16 20 13    Temp:    98.2 F (36.8 C)  TempSrc:    Axillary  SpO2: 97% 92% 98%   Weight:      Height:        Intake/Output Summary (Last 24 hours) at 01/07/2024 0746 Last data filed at 01/07/2024 0700 Gross per 24 hour  Intake 1311.55 ml  Output 2210 ml  Net -898.45 ml   Filed Weights   01/05/24 0500 01/06/24 0500 01/07/24 0331  Weight: 90.4 kg 117.1 kg 117.1 kg    Telemetry    NA - Personally Reviewed  ECG    NA - Personally Reviewed  Physical Exam   GEN: No acute distress.   Neck: No  JVD Cardiac: RRR, no murmurs, rubs, or gallops.  Respiratory:    Limited exam.  Decreased breath sounds GI: Soft, nontender, non-distended  MS:   Mild/mod edema; No deformity. Neuro:  Nonfocal  Psych: Normal affect   Labs    Chemistry Recent Labs  Lab 01/04/24 1804 01/04/24 2307 01/05/24 1613 01/06/24 0209  01/06/24 0839 01/06/24 1115 01/07/24 0549  NA  --    < > 139 138 139 139 141  K  --    < > 3.8 3.5 4.2 3.7 3.4*  CL  --    < > 103 102  --   --  102  CO2  --    < > 25 29  --   --  28  GLUCOSE  --    < > 139* 189*  --   --  82  BUN  --    < > 11 10  --   --  10  CREATININE  --    < > 0.99 1.01  --   --  0.86  CALCIUM   --    < > 8.2* 8.1*  --   --  8.3*  PROT 6.0*  --   --   --   --   --   --   ALBUMIN 2.4*  --  2.6*  --   --   --   --   AST 29  --   --   --   --   --   --   ALT 18  --   --   --   --   --   --  ALKPHOS 57  --   --   --   --   --   --   BILITOT 0.4  --   --   --   --   --   --   GFRNONAA  --    < > >60 >60  --   --  >60  ANIONGAP  --    < > 11 7  --   --  11   < > = values in this interval not displayed.     Hematology Recent Labs  Lab 01/04/24 1308 01/04/24 1333 01/05/24 0851 01/05/24 0958 01/06/24 0839 01/06/24 1115  WBC 10.4  --  11.5* 11.2*  --   --   RBC 4.86  --  4.66 4.61  --   --   HGB 14.0   < > 13.6 13.6 12.9* 15.0  HCT 43.8   < > 42.8 42.5 38.0* 44.0  MCV 90.1  --  91.8 92.2  --   --   MCH 28.8  --  29.2 29.5  --   --   MCHC 32.0  --  31.8 32.0  --   --   RDW 13.2  --  13.2 13.2  --   --   PLT 259  --  191 193  --   --    < > = values in this interval not displayed.    Cardiac EnzymesNo results for input(s): TROPONINI in the last 168 hours. No results for input(s): TROPIPOC in the last 168 hours.   BNP Recent Labs  Lab 01/04/24 1510  BNP 601.5*     DDimer No results for input(s): DDIMER in the last 168 hours.   Radiology    DG CHEST PORT 1 VIEW Result Date: 01/06/2024 CLINICAL DATA:  Respiratory failure EXAM: PORTABLE CHEST 1 VIEW COMPARISON:  None Available. FINDINGS: Endotracheal tube and NG tube unchanged. LEFT-sided pacer. Low lung volumes. Mild central venous congestion. No significant change. IMPRESSION: 1. Stable support apparatus. 2. No significant interval change. Electronically Signed   By: Jackquline Boxer M.D.    On: 01/06/2024 08:39   DG Abd 1 View Result Date: 01/05/2024 CLINICAL DATA:  Orogastric tube placement. EXAM: ABDOMEN - 1 VIEW COMPARISON:  January 04, 2024. FINDINGS: Distal tip of nasogastric tube is seen in expected position of distal stomach. IMPRESSION: Nasogastric tube tip seen in expected position of distal stomach. Electronically Signed   By: Lynwood Landy Raddle M.D.   On: 01/05/2024 14:50   DG CHEST PORT 1 VIEW Result Date: 01/05/2024 CLINICAL DATA:  Respiratory failure with hypoxia. EXAM: PORTABLE CHEST 1 VIEW COMPARISON:  January 04, 2024. FINDINGS: Stable cardiomegaly. Endotracheal and nasogastric tubes are unchanged. Left-sided defibrillator is unchanged. Bibasilar atelectasis or edema is noted with probable small pleural effusions. Bony thorax is unremarkable. IMPRESSION: Stable support apparatus. Bibasilar atelectasis or edema is noted with probable small pleural effusions. Electronically Signed   By: Lynwood Landy Raddle M.D.   On: 01/05/2024 14:49   ECHOCARDIOGRAM COMPLETE Result Date: 01/05/2024    ECHOCARDIOGRAM REPORT   Patient Name:   Luis [name] Date of Exam: 01/05/2024 Medical Rec #:  [id number]             Height:       65.0 in Accession #:    [id number]            Weight:       199.3 lb Date of Birth:  [birth year]  BSA:          1.975 m Patient Age:    53 years              BP:           122/66 mmHg Patient Gender: M                     HR:           82 bpm. Exam Location:  Inpatient Procedure: 2D Echo, Cardiac Doppler and Color Doppler (Both Spectral and Color            Flow Doppler were utilized during procedure). Indications:    Congestive Heart Failure I50.9  History:        Patient has prior history of Echocardiogram examinations, most                 recent 06/14/2023. CHF and Cardiomyopathy, Defibrillator; Risk                 Factors:Hypertension and Diabetes.  Sonographer:    Jayson Gaskins Referring Phys: [id number] KHABIB DGAYLI IMPRESSIONS  1. Left ventricular ejection  fraction, by estimation, is <20%. The left ventricle has severely decreased function. The left ventricle demonstrates global hypokinesis. Left ventricular diastolic parameters are consistent with Grade II diastolic dysfunction (pseudonormalization).  2. Right ventricular systolic function is normal. The right ventricular size is normal.  3. The mitral valve is normal in structure. Mild mitral valve regurgitation. No evidence of mitral stenosis.  4. The aortic valve is tricuspid. Aortic valve regurgitation is not visualized. No aortic stenosis is present.  5. The inferior vena cava is dilated in size with <50% respiratory variability, suggesting right atrial pressure of 15 mmHg. FINDINGS  Left Ventricle: Left ventricular ejection fraction, by estimation, is <20%. The left ventricle has severely decreased function. The left ventricle demonstrates global hypokinesis. The left ventricular internal cavity size was normal in size. There is no  left ventricular hypertrophy. Left ventricular diastolic parameters are consistent with Grade II diastolic dysfunction (pseudonormalization). Right Ventricle: The right ventricular size is normal. Right ventricular systolic function is normal. Left Atrium: Left atrial size was normal in size. Right Atrium: Right atrial size was normal in size. Pericardium: Trivial pericardial effusion is present. Mitral Valve: The mitral valve is normal in structure. Mild mitral valve regurgitation. No evidence of mitral valve stenosis. Tricuspid Valve: The tricuspid valve is normal in structure. Tricuspid valve regurgitation is not demonstrated. No evidence of tricuspid stenosis. Aortic Valve: The aortic valve is tricuspid. Aortic valve regurgitation is not visualized. No aortic stenosis is present. Aortic valve mean gradient measures 5.0 mmHg. Aortic valve peak gradient measures 8.5 mmHg. Aortic valve area, by VTI measures 1.82 cm. Pulmonic Valve: The pulmonic valve was grossly normal. Pulmonic  valve regurgitation is not visualized. No evidence of pulmonic stenosis. Aorta: The aortic root is normal in size and structure. Venous: The inferior vena cava is dilated in size with less than 50% respiratory variability, suggesting right atrial pressure of 15 mmHg. IAS/Shunts: No atrial level shunt detected by color flow Doppler. Additional Comments: A device lead is visualized.  LEFT VENTRICLE PLAX 2D LVIDd:         5.00 cm   Diastology LVIDs:         4.30 cm   LV e' medial:    7.51 cm/s LV PW:         1.30 cm   LV E/e' medial:  12.6  LV IVS:        0.90 cm   LV e' lateral:   4.57 cm/s LVOT diam:     1.90 cm   LV E/e' lateral: 20.8 LV SV:         45 LV SV Index:   23 LVOT Area:     2.84 cm  RIGHT VENTRICLE             IVC RV S prime:     12.70 cm/s  IVC diam: 2.80 cm TAPSE (M-mode): 1.9 cm LEFT ATRIUM           Index        RIGHT ATRIUM           Index LA Vol (A2C): 43.3 ml 21.92 ml/m  RA Area:     19.50 cm LA Vol (A4C): 27.7 ml 14.02 ml/m  RA Volume:   52.10 ml  26.38 ml/m  AORTIC VALVE AV Area (Vmax):    1.84 cm AV Area (Vmean):   2.02 cm AV Area (VTI):     1.82 cm AV Vmax:           146.00 cm/s AV Vmean:          111.000 cm/s AV VTI:            0.245 m AV Peak Grad:      8.5 mmHg AV Mean Grad:      5.0 mmHg LVOT Vmax:         95.00 cm/s LVOT Vmean:        79.000 cm/s LVOT VTI:          0.157 m LVOT/AV VTI ratio: 0.64  AORTA Ao Root diam: 2.80 cm MITRAL VALVE MV Area (PHT): 4.60 cm    SHUNTS MV Decel Time: 165 msec    Systemic VTI:  0.16 m MV E velocity: 94.90 cm/s  Systemic Diam: 1.90 cm MV A velocity: 73.40 cm/s MV E/A ratio:  1.29 Redell Shallow MD Electronically signed by Redell Shallow MD Signature Date/Time: 01/05/2024/8:02:20 AM    Final     Cardiac Studies   Echo as above.   Patient Profile     53 y.o. male  with a hx of hypertension, hyperlipidemia, diabetes, cocaine use, chronic HFrEF, history of VT s/p Medtronic BiV ICD, tobacco use, OSA on CPAP, anxiety, depression who is being seen  01/04/2024 for the evaluation of acute on chronic HFrEF at the request of Dr. Isadora.   Assessment & Plan    Acute systolic HF:   Self extubated.  Net negative at least 5000 ml.   Not adherent with meds.  Non ischemic cardiomyopathy.   Continue IV diuresis.   Would start with Entresto  and spironolactone  when taking POs.  I reviewed with the team.  I would send home on Entresto  24/26 bid, Torsemide  40 mg, Kdur 40 meq, spironolactone  if he leaves AMA.  We will make follow up.   ICD:   Was a no show for last check.    Interrogated in hospital.    Sleep apnea:  He has had CPAP in the past.      DVT:  I have suggested that because he had a provoked DVT in the past and has been off DOAC with non compliance that we would not resume unless there is evidence of new clotting.      Jehovah's Witness  For questions or updates, please contact [facility] Please consult [email or web address] for contact info under Cardiology/STEMI.  Signed, Lynwood Schilling, MD  01/07/2024, 7:46 AM

## 2024-01-07 NOTE — Plan of Care (Signed)
 Patient vitals remain stable after transfer.  He remains very anxious and agitated but follows commands.

## 2024-01-07 NOTE — Progress Notes (Signed)
 NAME:  Luis [name], MRN:  [id number], DOB:  18-Aug-1970, LOS: 3 ADMISSION DATE:  01/04/2024  History of Present Illness:  53 year old male history of HFrEF and cocaine use presented to the hospital with shortness of breath found to have acute hypoxic/hypercapnic respiratory failure and decompensated HFrEF. Intubated in the ED due to hypoxemia, bradycardia and unresponsiveness.  Self extubated on 7/10 and placed on BiPAP then HFNC.  Off sedation temporarily then placed back on Precedex  due to ongoing agitation.  Pertinent  Medical History  Tobacco use, HTN, HLD, DM, cocaine, systolic heart failure, pulm HTN   Significant Hospital Events: Including procedures, antibiotic start and stop dates in addition to other pertinent events   7/8: Admit for acute HF, acute hypoxic and hypercapnic respiratory failure. Intubated in ED 7/9: Starting Vanc and Zosyn , continues on fentanyl  and propofol , gradually weaning off vent.  Started tube feeds per RD 7/10: self-extubated, off sedation temporarily then placed back on precedex  d/t agitation 7/11: Stable on HFNC and off Precedex   Interim History / Subjective:  Concern for urinary retention due to bloody urine after Foley removal, normal bladder scan. No foley currently Remains on HFNC, Precedex  turned off   Objective    Blood pressure 121/68, pulse 69, temperature (!) 97.4 F (36.3 C), temperature source Axillary, resp. rate 20, height 5' 5 (1.651 m), weight 117.1 kg, SpO2 92%.    Vent Mode: Stand-by FiO2 (%):  [40 %-50 %] 50 % Set Rate:  [18 bmp] 18 bmp Vt Set:  [430 mL] 430 mL PEEP:  [5 cmH20] 5 cmH20 Plateau Pressure:  [14 cmH20] 14 cmH20   Intake/Output Summary (Last 24 hours) at 01/07/2024 0720 Last data filed at 01/07/2024 0600 Gross per 24 hour  Intake 1293.25 ml  Output 2210 ml  Net -916.75 ml   Filed Weights   01/05/24 0500 01/06/24 0500 01/07/24 0331  Weight: 90.4 kg 117.1 kg 117.1 kg    Examination: General: Sleeping  comfortably in bed, no acute distress HENT: Dry mucous membranes, normocephalic and atraumatic Lungs: CTAB, normal WOB on HFNC Cardiovascular: RRR, normal S1/S2, no M/R/G Abdomen: Normal active bowel sounds, soft, nontender, nondistended Extremities: No edema to BLE Neuro: Sleeping comfortably, moving all extremities spontaneously  Resolved problem list  N/A Assessment and Plan  NEURO #Acute metabolic encephalopathy 2/2 hypercarbia, s/p intubation and sedation Off sedation, MS returning to BL --CTM neuro status --PT/OT eval and treat --Stable for transfer to floor   #History of CVA December 2024 Prescribed statin and Plavix , no fill history on file --restarting statin today   PULM # Acute hypercapnic and hypoxic respiratory failure # Respiratory acidosis  2/2 acute HFrEF exacerbation, likely flash pulmonary edema # History of type II pulmonary hypertension found on cath in 2023 #Hx OSA Report of sudden onset dyspnea prior to admit, suspect secondary to flash pulmonary edema.  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  Report of recent cocaine use, unclear exact timing (3hr vs 3 days PTA).    Troponin 33 > 54 Echo here shows EF <20%. severely decreased LV function, G2 DD, normal RV function,  normal MV, normal AV.   S/p 80mg  lasix  BID yesterday with 2L UOP (likely underestimation given removal of purewick), 5L neg Last seen by cardiology in January 2024, prescribed GDMT- last filled 12/24. -- continue aggressive diuresis with Lasix  80 mg  BID -- Strict I/O's -- Daily weights -- Daily BMP, Mg (maintain K>4, Mg>2) -- Cards will slowly titrate GDMT, BP meds once taking PO --Recommend Entresto  24/26 twice daily, spironolactone  25 mg daily, torsemide  40 mg daily, potassium 40 mEq daily starting 7/12 versus today if patient chooses to leave AMA -- Consider RHC/LHC in future when stable per cards   #Hx UE DVT Appears it was a provoked DVT.  Was on Eliquis  in the past, do not see recent fills for this. Cardiology does not recommend ongoing treatment for this   INFECTIOUS #Concern for aspiration PNA Afebrile overnight.  7/9 labs with procalcitonin 0.13 > 1.22 on repeat. LA 1.9. WCC 10.4 on admit > 11.4 on 7/9. Possibly 2/2 aspiration from intubation. Neg strep pneumo, Legionella antigen. Bcx Ngx2 days.  Respiratory culture with moderate GBS, no Pseudomonas.  Switching to Unasyn  due to lower sodium content. -- Unasyn  3 g q6h daily --daily CBC --Tylenol  q6h PRN for fever   RENAL Renal function remains stable. Significant UOP, down 5L net since admission -- CTM with daily BMP -- Strict I's and O's as above   HEME/ONC #Patient is a Jehovah's Witness, refuses blood products DVT prophylaxis- Lovenox    ENDOCRINE #Hx DM2, A1c 13.9 -- Will likely need LAI once he starts eating more -- Moderate SSI -- q4h CBG -- DM education     FEN/GI #Concern for malnutrition and wt loss RD following.  SLP saw today, cleared for regular diet -- Carb modified/heart healthy diet  Best Practice (right click and Reselect all SmartList Selections daily)   Diet/type: NPO DVT prophylaxis LMWH Pressure ulcer(s): N/A GI prophylaxis: N/A Lines: N/A Foley:  N/A Code Status:  full code Last date of multidisciplinary goals of care discussion [not yet done given encephalopathy]  Labs   CBC: Recent Labs  Lab 01/04/24 1308 01/04/24 1333 01/04/24 2307 01/05/24 0851 01/05/24 0958 01/06/24 0839 01/06/24 1115  WBC 10.4  --   --  11.5* 11.2*   --   --   NEUTROABS  --   --   --  8.2* 8.1*  --   --   HGB 14.0   < > 12.6* 13.6 13.6 12.9* 15.0  HCT 43.8   < > 37.0* 42.8 42.5 38.0* 44.0  MCV 90.1  --   --  91.8 92.2  --   --   PLT 259  --   --  191 193  --   --    < > = values in this interval not displayed.    Basic Metabolic Panel: Recent Labs  Lab 01/05/24 0003 01/05/24 9385 01/05/24 1613 01/05/24 1616 01/06/24 0209 01/06/24 0839 01/06/24 1115 01/07/24 0549  NA 136 138 139  --  138 139 139 141  K 4.0 3.8 3.8  --  3.5 4.2 3.7 3.4*  CL 102 104 103  --  102  --   --  102  CO2 24 26 25   --  29  --   --  28  GLUCOSE 357* 182* 139*  --  189*  --   --  82  BUN 15 11 11   --  10  --   --  10  CREATININE 1.02 1.05 0.99  --  1.01  --   --  0.86  CALCIUM  8.4* 8.3* 8.2*  --  8.1*  --   --  8.3*  MG 1.7 1.7  --  1.9 1.9  --   --  2.0  PHOS  --   --  3.9 3.8 4.0  --   --  4.5   GFR: Estimated Creatinine Clearance: 114.9 mL/min (by C-G formula based on SCr of 0.86 mg/dL). Recent Labs  Lab 01/04/24 1308 01/04/24 1510 01/04/24 1634 01/04/24 1804 01/05/24 0851 01/05/24 0958  PROCALCITON  --   --  0.13  --  1.22  --   WBC 10.4  --   --   --  11.5* 11.2*  LATICACIDVEN  --  1.4  --  1.9  --   --     Liver Function Tests: Recent Labs  Lab 01/04/24 1804 01/05/24 1613  AST 29  --   ALT 18  --   ALKPHOS 57  --   BILITOT 0.4  --   PROT 6.0*  --   ALBUMIN 2.4* 2.6*   No results for input(s): LIPASE, AMYLASE in the last 168 hours. No results for input(s): AMMONIA in the last 168 hours.  ABG    Component Value Date/Time   PHART 7.380 01/06/2024 0839   PCO2ART 54.0 (H) 01/06/2024 0839   PO2ART 85 01/06/2024 0839   HCO3 31.8 (H) 01/06/2024 1115   TCO2 33 (H) 01/06/2024 1115   ACIDBASEDEF 1.0 01/04/2024 1754   O2SAT 89 01/06/2024 1115     Coagulation Profile: No results for input(s): INR, PROTIME in the last 168 hours.  Cardiac Enzymes: No results for input(s): CKTOTAL, CKMB, CKMBINDEX,  TROPONINI in the last 168 hours.  HbA1C: Hgb A1c MFr Bld  Date/Time Value Ref Range Status  01/04/2024 04:34 PM 13.9 (H) 4.8 - 5.6 % Final    Comment:    (NOTE) Diagnosis of Diabetes The following HbA1c ranges recommended by the American Diabetes Association (ADA) may be used as an aid in the diagnosis of diabetes mellitus.  Hemoglobin             Suggested A1C NGSP%              Diagnosis  <5.7                   Non Diabetic  5.7-6.4                Pre-Diabetic  >6.4                   Diabetic  <7.0                   Glycemic control for                       adults with diabetes.    06/13/2023 04:43 AM 12.0 (H) 4.8 - 5.6 % Final    Comment:    (NOTE) Pre diabetes:          5.7%-6.4%  Diabetes:              >6.4%  Glycemic control for   <7.0% adults with diabetes     CBG: Recent Labs  Lab 01/06/24 1340 01/06/24 1514 01/06/24 1926 01/06/24 2317 01/07/24 0326  GLUCAP 210* 192* 103* 103* 94    Review of Systems:   none  Past Medical History:  He,  has a past medical history of Anxiety, Biventricular ICD (implantable cardioverter-defibrillator) in place, Chronic  systolic CHF (congestive heart failure) (HCC), Cocaine use, Diabetes mellitus without complication (HCC), GERD (gastroesophageal reflux disease), HLD (hyperlipidemia), Hypertension, Morbid obesity (HCC), NICM (nonischemic cardiomyopathy) (HCC), OSA (obstructive sleep apnea), PTSD (post-traumatic stress disorder), Refusal of blood transfusions as patient is Jehovah's Witness, and Tobacco abuse.   Surgical History:   Past Surgical History:  Procedure Laterality Date   BIV ICD INSERTION CRT-D     FOOT FRACTURE SURGERY     ICD GENERATOR CHANGEOUT N/A 03/23/2022   Procedure: ICD GENERATOR CHANGEOUT;  Surgeon: Nancey, Eulas BRAVO, MD;  Location: [facility] INVASIVE CV LAB;  Service: Cardiovascular;  Laterality: N/A;   RIGHT HEART CATH N/A 03/06/2022   Procedure: RIGHT HEART CATH;  Surgeon: Wendel Lurena POUR, MD;   Location: [facility] INVASIVE CV LAB;  Service: Cardiovascular;  Laterality: N/A;   RIGHT/LEFT HEART CATH AND CORONARY ANGIOGRAPHY N/A 04/16/2022   Procedure: RIGHT/LEFT HEART CATH AND CORONARY ANGIOGRAPHY;  Surgeon: Rolan Ezra RAMAN, MD;  Location: [facility] INVASIVE CV LAB;  Service: Cardiovascular;  Laterality: N/A;   ROTATOR CUFF REPAIR     TONSILLECTOMY       Social History:   reports that he has been smoking cigarettes. He started smoking about 17 years ago. He has a 7.5 pack-year smoking history. He has never used smokeless tobacco. He reports that he does not currently use alcohol . He reports current drug use. Drugs: Cocaine and Marijuana.   Family History:  His family history includes Bone cancer in his father; Hypertension in his mother; Lung cancer in his father.   Allergies Allergies  Allergen Reactions   Aspirin Anaphylaxis and Other (See Comments)    Throat closing    Iodine-131 Hives   Peanut (Diagnostic) Anaphylaxis    Throat closes   Iodinated Contrast Media Hives    Can be premedicated    Latex Hives and Rash   Penicillins Nausea And Vomiting   Ultram  [Tramadol ] Hives and Nausea Only   Zestril [Lisinopril] Cough   Cinnamon    Amoxicillin-Pot Clavulanate Diarrhea   Lidocaine  Rash    Rash from adhesive on lidocaine  patches      Home Medications  Prior to Admission medications   Medication Sig Start Date End Date Taking? Authorizing Provider  bumetanide  (BUMEX ) 2 MG tablet Take 1 tablet (2 mg total) by mouth 2 (two) times daily. 06/28/23  Yes Angiulli, Toribio PARAS, PA-C  carvedilol  (COREG ) 3.125 MG tablet Take 1 tablet (3.125 mg total) by mouth 2 (two) times daily with a meal. 06/28/23  Yes Angiulli, Toribio PARAS, PA-C  cholecalciferol (VITAMIN D3) 25 MCG (1000 UNIT) tablet Take 1,000 Units by mouth daily.   Yes [provider]  clindamycin (CLEOCIN) 150 MG capsule Take 150 mg by mouth 3 (three) times daily. 12/06/23  Yes [provider]  clopidogrel  (PLAVIX ) 75 MG  tablet Take 1 tablet (75 mg total) by mouth daily. 06/28/23  Yes Angiulli, Daniel J, PA-C  diclofenac  Sodium (VOLTAREN ) 1 % GEL Apply 1 Application topically 2 (two) times daily as needed (pain). 10/20/22  Yes [provider]  DULoxetine  (CYMBALTA ) 30 MG capsule Take 1 capsule (30 mg total) by mouth daily. 06/28/23  Yes Angiulli, Toribio PARAS, PA-C  gabapentin  (NEURONTIN ) 300 MG capsule Take 1 capsule (300 mg total) by mouth 2 (two) times daily. Patient taking differently: Take 300 mg by mouth at bedtime. 06/28/23  Yes Angiulli, Toribio PARAS, PA-C  isoniazid  (NYDRAZID ) 300 MG tablet Take 1 tablet (300 mg total) by mouth daily. 06/28/23  Yes Pegge Toribio  J, PA-C  metoprolol  succinate (TOPROL -XL) 50 MG 24 hr tablet Take 150 mg by mouth daily.   Yes [provider]  mirtazapine (REMERON) 15 MG tablet Take 15 mg by mouth at bedtime as needed (sleep). 06/29/23  Yes [provider]  ondansetron  (ZOFRAN -ODT) 4 MG disintegrating tablet Take 4 mg by mouth daily as needed for nausea or vomiting.   Yes [provider]  potassium chloride  SA (KLOR-CON  M) 20 MEQ tablet Take 1 tablet (20 mEq total) by mouth daily. 06/28/23  Yes Angiulli, Toribio PARAS, PA-C  prazosin  (MINIPRESS ) 2 MG capsule Take 1 capsule (2 mg total) by mouth at bedtime. 06/28/23  Yes Angiulli, Toribio PARAS, PA-C  pyridoxine  (B-6) 100 MG tablet Take 1 tablet (100 mg total) by mouth daily. 06/28/23  Yes Angiulli, Toribio PARAS, PA-C  rosuvastatin  (CRESTOR ) 20 MG tablet Take 1 tablet (20 mg total) by mouth daily. Patient taking differently: Take 10 mg by mouth daily. 06/28/23  Yes Angiulli, Toribio PARAS, PA-C  sacubitril -valsartan  (ENTRESTO ) 97-103 MG Take 1 tablet by mouth 2 (two) times daily. 06/28/23  Yes Angiulli, Toribio PARAS, PA-C  spironolactone  (ALDACTONE ) 25 MG tablet Take 1 tablet (25 mg total) by mouth daily. 06/28/23  Yes Angiulli, Toribio PARAS, PA-C  torsemide  (DEMADEX ) 20 MG tablet Take 60 mg by mouth daily.   Yes [provider]  traZODone  (DESYREL ) 100 MG tablet Take 3 tablets (300 mg total) by mouth at bedtime. 06/28/23  Yes Angiulli, Toribio PARAS, PA-C  dapagliflozin  propanediol (FARXIGA ) 10 MG TABS tablet Take 1 tablet (10 mg total) by mouth daily. 06/28/23   Angiulli, Toribio PARAS, PA-C  digoxin  (LANOXIN ) 0.125 MG tablet Take 1 tablet (0.125 mg total) by mouth daily. 06/28/23   Angiulli, Toribio PARAS, PA-C  doxycycline  (VIBRAMYCIN ) 100 MG capsule Take 1 capsule (100 mg total) by mouth 2 (two) times daily. Patient not taking: Reported on 01/05/2024 11/10/23   Doretha Folks, MD  HYDROcodone -acetaminophen  (NORCO/VICODIN) 5-325 MG tablet Take 1 tablet by mouth every 4 (four) hours as needed. Patient not taking: Reported on 01/05/2024 11/10/23   Doretha Folks, MD  hydrOXYzine  (ATARAX ) 25 MG tablet Take 1 tablet (25 mg total) by mouth 3 (three) times daily as needed for anxiety. 09/03/21   Nwoko, Uchenna E, PA  hydrOXYzine  (VISTARIL ) 25 MG capsule Take 25 mg by mouth 3 (three) times daily. 06/29/23   [provider]  insulin  aspart (NOVOLOG  FLEXPEN) 100 UNIT/ML FlexPen Inject 15 Units into the skin 3 (three) times daily with meals. Patient taking differently: Inject 20 Units into the skin 3 (three) times daily with meals. 06/28/23   Angiulli, Toribio PARAS, PA-C  insulin  glargine (LANTUS ) 100 UNIT/ML Solostar Pen Inject 38 Units into the skin 2 (two) times daily. 06/28/23   Angiulli, Toribio PARAS, PA-C  Insulin  Pen Needle 32G X 4 MM MISC Use as directed up to four times daily 06/28/23   Angiulli, Toribio PARAS, PA-C  loratadine  (CLARITIN ) 10 MG tablet Take 10 mg by mouth daily.    [provider]  oxyCODONE -acetaminophen  (PERCOCET/ROXICET) 5-325 MG tablet Take 1 tablet by mouth every 8 (eight) hours as needed for severe pain (pain score 7-10). Patient not taking: Reported on 01/05/2024 09/21/23   Midge Golas, MD  pantoprazole  (PROTONIX ) 40 MG tablet Take 1 tablet (40 mg total) by mouth daily. 06/28/23   Angiulli,  Toribio PARAS, PA-C  sacubitril -valsartan  (ENTRESTO ) 24-26 MG Take 1 tablet by mouth 2 (two) times daily. Patient not taking: Reported on 01/05/2024  [provider]     Critical care time: 30 min

## 2024-01-07 NOTE — Telephone Encounter (Signed)
 Patient Product/process development scientist completed.    The patient is insured through [employer]. Patient has Medicare and is not eligible for a copay card, but may be able to apply for patient assistance or Medicare RX Payment Plan (Patient Must reach out to their plan, if eligible for payment plan), if available.    Ran test claim for Entresto 24-26 mg and the current 30 day co-pay is $0.00.   This test claim was processed through [facility]- copay amounts may vary at other pharmacies due to pharmacy/plan contracts, or as the patient moves through the different stages of their insurance plan.     Morgan Arab, CPHT Pharmacy Technician III Certified Patient Advocate [facility] Patient Advocate Team Direct Number: [phone]  Fax: [phone]

## 2024-01-08 DIAGNOSIS — E1140 Type 2 diabetes mellitus with diabetic neuropathy, unspecified: Secondary | ICD-10-CM

## 2024-01-08 DIAGNOSIS — K219 Gastro-esophageal reflux disease without esophagitis: Secondary | ICD-10-CM

## 2024-01-08 DIAGNOSIS — E66813 Obesity, class 3: Secondary | ICD-10-CM

## 2024-01-08 DIAGNOSIS — J9601 Acute respiratory failure with hypoxia: Secondary | ICD-10-CM | POA: Diagnosis not present

## 2024-01-08 DIAGNOSIS — I5021 Acute systolic (congestive) heart failure: Secondary | ICD-10-CM | POA: Diagnosis not present

## 2024-01-08 DIAGNOSIS — I5020 Unspecified systolic (congestive) heart failure: Secondary | ICD-10-CM | POA: Diagnosis not present

## 2024-01-08 DIAGNOSIS — J690 Pneumonitis due to inhalation of food and vomit: Secondary | ICD-10-CM | POA: Diagnosis not present

## 2024-01-08 LAB — GLUCOSE, CAPILLARY
Glucose-Capillary: 232 mg/dL — ABNORMAL HIGH (ref 70–99)
Glucose-Capillary: 319 mg/dL — ABNORMAL HIGH (ref 70–99)
Glucose-Capillary: 299 mg/dL — ABNORMAL HIGH (ref 70–99)
Glucose-Capillary: 299 mg/dL — ABNORMAL HIGH (ref 70–99)

## 2024-01-08 LAB — CBC
HCT: 38.6 % — ABNORMAL LOW (ref 39.0–52.0)
Hemoglobin: 12.7 g/dL — ABNORMAL LOW (ref 13.0–17.0)
MCH: 29.4 pg (ref 26.0–34.0)
MCHC: 32.9 g/dL (ref 30.0–36.0)
MCV: 89.4 fL (ref 80.0–100.0)
Platelets: 201 K/uL (ref 150–400)
RBC: 4.32 MIL/uL (ref 4.22–5.81)
RDW: 12.7 % (ref 11.5–15.5)
WBC: 7.4 K/uL (ref 4.0–10.5)
nRBC: 0 % (ref 0.0–0.2)

## 2024-01-08 LAB — BASIC METABOLIC PANEL WITH GFR
Anion gap: 10 (ref 5–15)
BUN: 12 mg/dL (ref 6–20)
CO2: 27 mmol/L (ref 22–32)
Calcium: 8.3 mg/dL — ABNORMAL LOW (ref 8.9–10.3)
Chloride: 99 mmol/L (ref 98–111)
Creatinine, Ser: 0.96 mg/dL (ref 0.61–1.24)
GFR, Estimated: >60 mL/min (ref >60)
Glucose, Bld: 378 mg/dL — ABNORMAL HIGH (ref 70–99)
Potassium: 3.8 mmol/L (ref 3.5–5.1)
Sodium: 136 mmol/L (ref 135–145)

## 2024-01-08 LAB — URIC ACID: Uric Acid, Serum: 6.8 mg/dL (ref 3.7–8.6)

## 2024-01-08 LAB — PHOSPHORUS: Phosphorus: 3.3 mg/dL (ref 2.5–4.6)

## 2024-01-08 LAB — MAGNESIUM: Magnesium: 1.8 mg/dL (ref 1.7–2.4)

## 2024-01-08 MED ORDER — PREDNISONE 10 MG PO TABS
10.0000 mg | ORAL_TABLET | Freq: Every day | ORAL | Status: DC
  Administered 2024-01-09: 10 mg via ORAL
  Filled 2024-01-08: qty 1

## 2024-01-08 MED ORDER — THIAMINE MONONITRATE 100 MG PO TABS
100.0000 mg | ORAL_TABLET | Freq: Every day | ORAL | Status: DC
  Administered 2024-01-09 – 2024-01-10 (×2): 100 mg via ORAL
  Filled 2024-01-08 (×2): qty 1

## 2024-01-08 MED ORDER — SACUBITRIL-VALSARTAN 24-26 MG PO TABS
1.0000 | ORAL_TABLET | Freq: Two times a day (BID) | ORAL | Status: DC
  Administered 2024-01-08 – 2024-01-10 (×5): 1 via ORAL
  Filled 2024-01-08 (×5): qty 1

## 2024-01-08 MED ORDER — FUROSEMIDE 10 MG/ML IJ SOLN
60.0000 mg | Freq: Once | INTRAMUSCULAR | Status: AC
  Administered 2024-01-08: 60 mg via INTRAVENOUS
  Filled 2024-01-08: qty 6

## 2024-01-08 MED ORDER — GABAPENTIN 100 MG PO CAPS
100.0000 mg | ORAL_CAPSULE | Freq: Every day | ORAL | Status: DC
  Administered 2024-01-08: 100 mg via ORAL
  Filled 2024-01-08: qty 1

## 2024-01-08 NOTE — Progress Notes (Signed)
 Progress Note  Patient Name: Luis [name] Date of Encounter: 01/08/2024  Primary Cardiologist:   Alm Clay, MD   Subjective   Pt denies CP  Breahting is OK  Laying in bed   Inpatient Medications    Scheduled Meds:  Chlorhexidine  Gluconate Cloth  6 each Topical Daily   enoxaparin  (LOVENOX ) injection  60 mg Subcutaneous Q24H   feeding supplement  237 mL Oral BID BM   furosemide   80 mg Intravenous BID   insulin  aspart  0-15 Units Subcutaneous TID WC   insulin  aspart  0-5 Units Subcutaneous QHS   multivitamin with minerals  1 tablet Oral Daily   mupirocin  ointment  1 Application Nasal BID   rosuvastatin   10 mg Oral Daily   thiamine   100 mg Per Tube Daily   traZODone   300 mg Oral QHS   Continuous Infusions:  ampicillin -sulbactam (UNASYN ) IV 3 g (01/07/24 2308)   PRN Meds: acetaminophen , atropine , hydrOXYzine , mouth rinse, polyethylene glycol, prednisoLONE  acetate, senna   Vital Signs    Vitals:   01/07/24 1506 01/07/24 1920 01/07/24 2315 01/08/24 0442  BP: 133/76 125/72 124/63 (!) 151/72  Pulse: 88 (!) 101 98 (!) 107  Resp: 20 20    Temp: 98.3 F (36.8 C) 99.4 F (37.4 C) 98.9 F (37.2 C)   TempSrc: Oral Oral Oral Oral  SpO2: 99% 95% 94% 94%  Weight: 114.4 kg     Height: 5' 5 (1.651 m)       Intake/Output Summary (Last 24 hours) at 01/08/2024 0639 Last data filed at 01/08/2024 0515 Gross per 24 hour  Intake 1332.07 ml  Output 3500 ml  Net -2167.93 ml   Filed Weights   01/06/24 0500 01/07/24 0331 01/07/24 1506  Weight: 117.1 kg 117.1 kg 114.4 kg    Telemetry    SR with pacing  - Personally Reviewed  ECG    NA - Personally Reviewed  Physical Exam   GEN: No acute distress.   Neck: No  JVD Cardiac: RRR, no murmurs,  Respiratory:    CTA anteriorly    GI: Soft, nontender, non-distended  MS:   Tr to 1+ LE edema. Neuro:  Nonfocal  Psych: Normal affect   Labs    Chemistry Recent Labs  Lab 01/04/24 1804 01/04/24 2307  01/05/24 1613 01/06/24 0209 01/06/24 0839 01/06/24 1115 01/07/24 0549  NA  --    < > 139 138 139 139 141  K  --    < > 3.8 3.5 4.2 3.7 3.4*  CL  --    < > 103 102  --   --  102  CO2  --    < > 25 29  --   --  28  GLUCOSE  --    < > 139* 189*  --   --  82  BUN  --    < > 11 10  --   --  10  CREATININE  --    < > 0.99 1.01  --   --  0.86  CALCIUM   --    < > 8.2* 8.1*  --   --  8.3*  PROT 6.0*  --   --   --   --   --   --   ALBUMIN 2.4*  --  2.6*  --   --   --   --   AST 29  --   --   --   --   --   --  ALT 18  --   --   --   --   --   --   ALKPHOS 57  --   --   --   --   --   --   BILITOT 0.4  --   --   --   --   --   --   GFRNONAA  --    < > >60 >60  --   --  >60  ANIONGAP  --    < > 11 7  --   --  11   < > = values in this interval not displayed.     Hematology Recent Labs  Lab 01/05/24 0851 01/05/24 0958 01/06/24 0839 01/06/24 1115 01/07/24 1553  WBC 11.5* 11.2*  --   --  9.4  RBC 4.66 4.61  --   --  4.56  HGB 13.6 13.6 12.9* 15.0 13.3  HCT 42.8 42.5 38.0* 44.0 40.4  MCV 91.8 92.2  --   --  88.6  MCH 29.2 29.5  --   --  29.2  MCHC 31.8 32.0  --   --  32.9  RDW 13.2 13.2  --   --  12.8  PLT 191 193  --   --  205    Cardiac EnzymesNo results for input(s): TROPONINI in the last 168 hours. No results for input(s): TROPIPOC in the last 168 hours.   BNP Recent Labs  Lab 01/04/24 1510  BNP 601.5*     DDimer No results for input(s): DDIMER in the last 168 hours.   Radiology    DG CHEST PORT 1 VIEW Result Date: 01/06/2024 CLINICAL DATA:  Respiratory failure EXAM: PORTABLE CHEST 1 VIEW COMPARISON:  None Available. FINDINGS: Endotracheal tube and NG tube unchanged. LEFT-sided pacer. Low lung volumes. Mild central venous congestion. No significant change. IMPRESSION: 1. Stable support apparatus. 2. No significant interval change. Electronically Signed   By: Jackquline Boxer M.D.   On: 01/06/2024 08:39    Cardiac Studies  July 2025 echo  1. Left ventricular  ejection fraction, by estimation, is <20%. The left  ventricle has severely decreased function. The left ventricle demonstrates  global hypokinesis. Left ventricular diastolic parameters are consistent  with Grade II diastolic  dysfunction (pseudonormalization).   2. Right ventricular systolic function is normal. The right ventricular  size is normal.   3. The mitral valve is normal in structure. Mild mitral valve  regurgitation. No evidence of mitral stenosis.   4. The aortic valve is tricuspid. Aortic valve regurgitation is not  visualized. No aortic stenosis is present.   5. The inferior vena cava is dilated in size with <50% respiratory  variability, suggesting right atrial pressure of 15 mmHg.    Patient Profile     53 y.o. male  with a hx of hypertension, hyperlipidemia, diabetes, cocaine use, chronic HFrEF, history of VT s/p [employer] BiV ICD, tobacco use, OSA on CPAP, anxiety, depression who is being seen 01/04/2024 for the evaluation of acute on chronic HFrEF at the request of Dr. Isadora.   Assessment & Plan  HFrEF  Felt NICM  LVEF less than 20%   Pt hs diuresed sigfncantly    Keep on IV lasix  for now  Add Entresto     Will try to add aldactone  as well    ICD:   Was a no show for last check.    Interrogated in hospital.    HTN    BP is OK  Follow  with addition of meds   Hx of CAD REported PCI in  Care Everywhere     Ca calcifications on CT  Pt denies CP   Rx risf factors  HL   Pt on statin   Will need to follow lipitds    Sleep apnea:  He has had CPAP in the past.      Hx of DVT   Provoked in past    Plan to follow    Would resume anticoagulation  if evidence of new clot   in future    Jehovah's Witness  Hx substance abuse Hx noncompliance   For questions or updates, please contact [facility] Please consult [email or web address] for contact info under Cardiology/STEMI.   Signed, Vina Gull, MD  01/08/2024, 6:39 AM

## 2024-01-08 NOTE — Plan of Care (Addendum)
 Patient refused bedtime sliding scale insulin , AM CBG, and morning weight. Educated about importance of care, patient agitated and requesting to be left alone.    Problem: Clinical Measurements: Goal: Respiratory complications will improve Outcome: Progressing   Problem: Safety: Goal: Ability to remain free from injury will improve Outcome: Progressing

## 2024-01-08 NOTE — Progress Notes (Signed)
 Progress Note   Patient: Luis [name] FMW:[id number] DOB: October 14, 1970 DOA: 01/04/2024     4 DOS: the patient was seen and examined on 01/08/2024   Brief hospital course: 53 y.o. male  with a hx of hypertension, hyperlipidemia, diabetes, cocaine use, chronic HFrEF, history of VT s/p Medtronic BiV ICD, tobacco use, OSA on CPAP, anxiety and depression; who was admitted to the hospital secondary to acute respiratory failure with hypoxia initially ventilatory dependent; appears to be in the setting of acute on chronic combined diastolic and systolic heart failure.  Patient self extubated and currently on oxygen completely weaned off to room air.  Still with signs of fluid overload on exam.  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 -Still with signs of fluid overload on exam - Good urine output appreciated - Continue IV diuresis - Entresto  and Aldactone  to be added as part of GDMT - Cardiology service on board and will follow recommendations  2-hypertension - Currently stable - Continue to follow vital sign - Heart healthy/low-sodium diet discussed with patient.  3-history of coronary artery disease - Patient reports PCI in the past - No chest pain currently appreciated - Continue telemetry monitoring - Continue statin and risk factor modification. - CT scan demonstrating coronary artery calcification. - Follow cardiology recommendations.  4-morbid obesity -Body mass index is 41.97 kg/m.  - Low-calorie diet, portion control and increase physical activity discussed with patient.  5-obstructive sleep apnea - Continue CPAP nightly  6-hyperlipidemia - Continue statin  7-history of medication noncompliance - Education and compliance importance discussed with patient - Advised to be compliant with appointments as an outpatient as well to further  adjust medications as needed.  8-aspiration PNA -no productive cough -afebrile -continue abx's to complete therapy (2 more days of antibiotics planned).  9-left foot pain/numbness -will check uric acid -?? Acute gout -low dose neurontin  will also be started given underlying hx of DM and concerns for neuropathy  10-type 2 diabetes mellitus - Continue sliding scale insulin  - Constant for neuropathy as mentioned above - Continue treatment with Neurontin .   Subjective:  Afebrile, no chest pain, no nausea vomiting.  Reports breathing improving and expressed good urine output.  Physical Exam: Vitals:   01/07/24 1920 01/07/24 2315 01/08/24 0442 01/08/24 0742  BP: 125/72 124/63 (!) 151/72 (!) 149/81  Pulse: (!) 101 98 (!) 107 (!) 106  Resp: 20   20  Temp: 99.4 F (37.4 C) 98.9 F (37.2 C) 98 F (36.7 C) 98.3 F (36.8 C)  TempSrc: Oral Oral Oral Oral  SpO2: 95% 94% 94% 95%  Weight:      Height:       General exam: Alert, awake, oriented x 3; in no acute distress.  Positive signs of volume overload appreciated on exam. Respiratory system: Decreased breath sounds at the bases; no using accessory muscle. Cardiovascular system: Rate controlled, no rubs, no gallops, unable to assess JVD with body habitus. Gastrointestinal system: Abdomen is obese, nondistended, soft and nontender. No organomegaly or masses felt. Normal bowel sounds heard. Central nervous system: No focal neurological deficits. Extremities: No cyanosis or clubbing. Skin: No petechiae. Psychiatry: Judgement and insight appear normal.  Flat affect appreciated on exam.   Data Reviewed: Basic metabolic panel: Sodium 136, potassium 3.8, chloride 99, bicarb 27, BUN 12, creatinine 0.96 and GFR >60 Magnesium : 1.8 CBC: WBC 7.4, hemoglobin 12.7 and platelet count 201K  Family Communication: No  family at bedside.  Disposition: Status is: Inpatient Remains inpatient appropriate because: Continue IV  diuresis.  Anticipating discharge back home once medically stable.  Time spent: 50 minutes  Author: Eric Nunnery, MD 01/08/2024 4:16 PM  For on call review [email or web address].

## 2024-01-08 NOTE — Evaluation (Signed)
 Physical Therapy Evaluation Patient Details Name: Luis [name] MRN: [id number] DOB: 1970-11-14 Today's Date: 01/08/2024  History of Present Illness  Pt is 53 yo presenting to [facility] on 7/8 due to chronic acute on chronic HfrEF. Pt intubated on arrival and self extubated. PMH includes HTN, HLD, DMII, OSA, VT s/p ICD.  Clinical Impression  Pt is currently below previous level of function. Pt requires CGA for sit to stand and CGA/Min A for gait with RW. Pt si reporting numbness in the L foot and RN notified. Due to pt current functional status, home set up and available assistance at home recommending skilled physical therapy services 3x/week in order to address strength, balance and functional mobility to decrease risk for falls, injury and re-hospitalization.           If plan is discharge home, recommend the following: A little help with walking and/or transfers;Assistance with cooking/housework;Help with stairs or ramp for entrance     Equipment Recommendations None recommended by PT     Functional Status Assessment Patient has had a recent decline in their functional status and demonstrates the ability to make significant improvements in function in a reasonable and predictable amount of time.     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    Ambulation/Gait Ambulation/Gait assistance: Contact guard assist, Min assist Gait Distance (Feet): 120 Feet Assistive device: Rolling walker (2 wheels) Gait  Pattern/deviations: Step-through pattern, Wide base of support, Decreased dorsiflexion - right, Decreased dorsiflexion - left, Decreased weight shift to left Gait velocity: decreased Gait velocity interpretation: <1.8 ft/sec, indicate of risk for recurrent falls   General Gait Details: occasionally pt would lock knees, start swaying, grab on to rail on wall; Denied dizziness with these episodes. HR 115 bpm max during gait.     Balance Overall balance assessment: Needs assistance Sitting-balance support: Single extremity supported, Feet supported Sitting balance-Leahy Scale: Good     Standing balance support: Bilateral upper extremity supported, Reliant on assistive device for balance, During functional activity Standing balance-Leahy Scale: Poor           Pertinent Vitals/Pain Pain Assessment Pain Assessment: No/denies pain (reports numbness in the L foot and RN notified.)    Home Living Family/patient expects to be discharged to:: Private residence Living Arrangements: Alone Available Help at Discharge: Available PRN/intermittently;Friend(s) Type of Home: Apartment Home Access: Stairs to enter Entrance Stairs-Rails: None Entrance Stairs-Number of Steps: 2   Home Layout: One level Home Equipment: Educational psychologist (4 wheels);Electric scooter Additional Comments: reports apartment is handicap accessible, on 2L O2 via nasal cannula with pt reporting he uses it as needed depending on how he feels    Prior Function Prior Level of Function : Independent/Modified Independent;Driving             Mobility Comments: rollator ADLs Comments: ind; has assist for cleaning     Extremity/Trunk Assessment   Upper Extremity Assessment Upper Extremity Assessment: Generalized weakness    Lower Extremity Assessment Lower Extremity Assessment: Generalized weakness    Cervical / Trunk Assessment Cervical /  Trunk Assessment: Normal  Communication    Communication Communication: No apparent difficulties    Cognition Arousal: Alert Behavior During Therapy: WFL for tasks assessed/performed   PT - Cognitive impairments: No apparent impairments     Following commands: Intact       Cueing Cueing Techniques: Verbal cues            Assessment/Plan    PT Assessment Patient needs continued PT services  PT Problem List Decreased strength;Decreased balance;Decreased mobility;Decreased activity tolerance;Decreased safety awareness;Impaired sensation       PT Treatment Interventions DME instruction;Balance training;Gait training;Stair training;Functional mobility training;Therapeutic activities;Therapeutic exercise;Patient/family education    PT Goals (Current goals can be found in the Care Plan section)  Acute Rehab PT Goals Patient Stated Goal: to improve mobility and get home. PT Goal Formulation: With patient Time For Goal Achievement: 01/22/24 Potential to Achieve Goals: Good    Frequency Min 2X/week        AM-PAC PT 6 Clicks Mobility  Outcome Measure Help needed turning from your back to your side while in a flat bed without using bedrails?: None Help needed moving from lying on your back to sitting on the side of a flat bed without using bedrails?: None Help needed moving to and from a bed to a chair (including a wheelchair)?: A Little Help needed standing up from a chair using your arms (e.g., wheelchair or bedside chair)?: A Little Help needed to walk in hospital room?: A Little Help needed climbing 3-5 steps with a railing? : A Lot 6 Click Score: 19    End of Session Equipment Utilized During Treatment: Gait belt Activity Tolerance: Patient tolerated treatment well Patient left: in chair;with call bell/phone within reach;with chair alarm set Nurse Communication: Mobility status PT Visit Diagnosis: Unsteadiness on feet (R26.81);Other abnormalities of gait and mobility (R26.89);Muscle weakness (generalized)  (M62.81)    Time: 8569-8545 PT Time Calculation (min) (ACUTE ONLY): 24 min   Charges:   PT Evaluation $PT Eval Low Complexity: 1 Low PT Treatments $Therapeutic Activity: 8-22 mins PT General Charges $$ ACUTE PT VISIT: 1 Visit        Dorothyann Maier, DPT, CLT  Acute Rehabilitation Services Office: [phone] (Secure chat preferred)   Dorothyann VEAR Maier 01/08/2024, 3:11 PM

## 2024-01-09 DIAGNOSIS — J9601 Acute respiratory failure with hypoxia: Secondary | ICD-10-CM | POA: Diagnosis not present

## 2024-01-09 DIAGNOSIS — I5020 Unspecified systolic (congestive) heart failure: Secondary | ICD-10-CM | POA: Diagnosis not present

## 2024-01-09 LAB — CULTURE, BLOOD (ROUTINE X 2)
Culture: NO GROWTH
Culture: NO GROWTH
Special Requests: ADEQUATE

## 2024-01-09 LAB — MAGNESIUM: Magnesium: 1.7 mg/dL (ref 1.7–2.4)

## 2024-01-09 LAB — BASIC METABOLIC PANEL WITH GFR
Anion gap: 11 (ref 5–15)
BUN: 14 mg/dL (ref 6–20)
CO2: 27 mmol/L (ref 22–32)
Calcium: 8.6 mg/dL — ABNORMAL LOW (ref 8.9–10.3)
Chloride: 96 mmol/L — ABNORMAL LOW (ref 98–111)
Creatinine, Ser: 1.08 mg/dL (ref 0.61–1.24)
GFR, Estimated: >60 mL/min (ref >60)
Glucose, Bld: 451 mg/dL — ABNORMAL HIGH (ref 70–99)
Potassium: 3.8 mmol/L (ref 3.5–5.1)
Sodium: 134 mmol/L — ABNORMAL LOW (ref 135–145)

## 2024-01-09 LAB — GLUCOSE, CAPILLARY
Glucose-Capillary: 410 mg/dL — ABNORMAL HIGH (ref 70–99)
Glucose-Capillary: 455 mg/dL — ABNORMAL HIGH (ref 70–99)
Glucose-Capillary: 329 mg/dL — ABNORMAL HIGH (ref 70–99)
Glucose-Capillary: 131 mg/dL — ABNORMAL HIGH (ref 70–99)

## 2024-01-09 LAB — CBC
HCT: 41 % (ref 39.0–52.0)
Hemoglobin: 13.5 g/dL (ref 13.0–17.0)
MCH: 29 pg (ref 26.0–34.0)
MCHC: 32.9 g/dL (ref 30.0–36.0)
MCV: 88 fL (ref 80.0–100.0)
Platelets: 222 K/uL (ref 150–400)
RBC: 4.66 MIL/uL (ref 4.22–5.81)
RDW: 12.5 % (ref 11.5–15.5)
WBC: 7.5 K/uL (ref 4.0–10.5)
nRBC: 0 % (ref 0.0–0.2)

## 2024-01-09 LAB — CULTURE, BLOOD (SINGLE)
Culture: NO GROWTH
Special Requests: ADEQUATE

## 2024-01-09 MED ORDER — INSULIN ASPART 100 UNIT/ML IJ SOLN
10.0000 [IU] | Freq: Three times a day (TID) | INTRAMUSCULAR | Status: DC
  Administered 2024-01-09 – 2024-01-10 (×3): 10 [IU] via SUBCUTANEOUS

## 2024-01-09 MED ORDER — INSULIN GLARGINE-YFGN 100 UNIT/ML ~~LOC~~ SOLN
15.0000 [IU] | Freq: Every day | SUBCUTANEOUS | Status: DC
  Administered 2024-01-09: 15 [IU] via SUBCUTANEOUS
  Filled 2024-01-09: qty 0.15

## 2024-01-09 MED ORDER — GABAPENTIN 300 MG PO CAPS
300.0000 mg | ORAL_CAPSULE | Freq: Every day | ORAL | Status: DC
  Administered 2024-01-09: 300 mg via ORAL
  Filled 2024-01-09: qty 1

## 2024-01-09 MED ORDER — INSULIN ASPART 100 UNIT/ML IJ SOLN
0.0000 [IU] | Freq: Three times a day (TID) | INTRAMUSCULAR | Status: DC
  Administered 2024-01-09: 15 [IU] via SUBCUTANEOUS
  Administered 2024-01-09 – 2024-01-10 (×2): 20 [IU] via SUBCUTANEOUS
  Administered 2024-01-10: 15 [IU] via SUBCUTANEOUS

## 2024-01-09 MED ORDER — FUROSEMIDE 40 MG PO TABS
80.0000 mg | ORAL_TABLET | Freq: Every day | ORAL | Status: DC
  Administered 2024-01-10: 80 mg via ORAL
  Filled 2024-01-09: qty 2

## 2024-01-09 MED ORDER — INSULIN GLARGINE-YFGN 100 UNIT/ML ~~LOC~~ SOLN
30.0000 [IU] | Freq: Two times a day (BID) | SUBCUTANEOUS | Status: DC
  Administered 2024-01-09 – 2024-01-10 (×2): 30 [IU] via SUBCUTANEOUS
  Filled 2024-01-09 (×3): qty 0.3

## 2024-01-09 MED ORDER — INSULIN ASPART 100 UNIT/ML IJ SOLN: 0.0000 [IU] | Freq: Every day | INTRAMUSCULAR | Status: DC

## 2024-01-09 MED ORDER — SPIRONOLACTONE 25 MG PO TABS
25.0000 mg | ORAL_TABLET | Freq: Every day | ORAL | Status: DC
  Administered 2024-01-09 – 2024-01-10 (×2): 25 mg via ORAL
  Filled 2024-01-09 (×2): qty 1

## 2024-01-09 MED ORDER — METOPROLOL SUCCINATE ER 25 MG PO TB24
25.0000 mg | ORAL_TABLET | Freq: Two times a day (BID) | ORAL | Status: DC
  Administered 2024-01-09 – 2024-01-10 (×2): 25 mg via ORAL
  Filled 2024-01-09 (×2): qty 1

## 2024-01-09 NOTE — Progress Notes (Signed)
 Patient to self administer CPAP when ready for sleep. Equipment is within reach and patient is familiar with procedure.

## 2024-01-09 NOTE — Plan of Care (Signed)
  Problem: Clinical Measurements: Goal: Will remain free from infection Outcome: Progressing Goal: Respiratory complications will improve Outcome: Progressing Goal: Cardiovascular complication will be avoided Outcome: Progressing   Problem: Activity: Goal: Risk for activity intolerance will decrease Outcome: Progressing   Problem: Nutrition: Goal: Adequate nutrition will be maintained Outcome: Progressing   Problem: Elimination: Goal: Will not experience complications related to bowel motility Outcome: Progressing Goal: Will not experience complications related to urinary retention Outcome: Progressing   Problem: Safety: Goal: Ability to remain free from injury will improve Outcome: Progressing

## 2024-01-09 NOTE — Progress Notes (Signed)
 Mobility Specialist Progress Note:   01/09/24 1045  Mobility  Activity Ambulated with assistance in hallway  Level of Assistance Contact guard assist, steadying assist  Assistive Device Front wheel walker  Distance Ambulated (ft) 400 ft  Activity Response Tolerated well  Mobility Referral Yes  Mobility visit 1 Mobility  Mobility Specialist Start Time (ACUTE ONLY) 1045  Mobility Specialist Stop Time (ACUTE ONLY) 1056  Mobility Specialist Time Calculation (min) (ACUTE ONLY) 11 min   Pt agreeable to mobility session. Required occasional steadying assist with RW. VSS on RA. Left sitting EOB with all needs met.   Therisa Rana Mobility Specialist Please contact via SecureChat or  Rehab office at [phone]

## 2024-01-09 NOTE — Progress Notes (Signed)
 Progress Note  Patient Name: Luis [name] Date of Encounter: 01/09/2024  Primary Cardiologist:   Alm Clay, MD   Subjective   Pt laying in bed  Deneis CP  Says breathing OK  Complains of pain in feet  Inpatient Medications    Scheduled Meds:  Chlorhexidine  Gluconate Cloth  6 each Topical Daily   enoxaparin  (LOVENOX ) injection  60 mg Subcutaneous Q24H   feeding supplement  237 mL Oral BID BM   furosemide   80 mg Intravenous BID   gabapentin   100 mg Oral QHS   insulin  aspart  0-15 Units Subcutaneous TID WC   insulin  aspart  0-5 Units Subcutaneous QHS   multivitamin with minerals  1 tablet Oral Daily   mupirocin  ointment  1 Application Nasal BID   predniSONE   10 mg Oral Q breakfast   rosuvastatin   10 mg Oral Daily   sacubitril -valsartan   1 tablet Oral BID   thiamine   100 mg Oral Daily   traZODone   300 mg Oral QHS   Continuous Infusions:  ampicillin -sulbactam (UNASYN ) IV 3 g (01/09/24 0542)   PRN Meds: acetaminophen , atropine , hydrOXYzine , mouth rinse, polyethylene glycol, prednisoLONE  acetate, senna   Vital Signs    Vitals:   01/08/24 1915 01/09/24 0017 01/09/24 0423 01/09/24 0500  BP: (!) 142/68 (!) 146/84 111/71   Pulse: 94 98 100   Resp: 20 20 18    Temp: 98.4 F (36.9 C) 98.2 F (36.8 C) 98.3 F (36.8 C)   TempSrc: Oral Oral Axillary   SpO2: 94% 94% 95%   Weight:    114.8 kg  Height:        Intake/Output Summary (Last 24 hours) at 01/09/2024 9287 Last data filed at 01/09/2024 0707 Gross per 24 hour  Intake 1540 ml  Output 3420 ml  Net -1880 ml   Net neg 9 L   Filed Weights   01/07/24 0331 01/07/24 1506 01/09/24 0500  Weight: 117.1 kg 114.4 kg 114.8 kg    Telemetry    SR with pacing activity - Personally Reviewed  ECG    NA - Personally Reviewed  Physical Exam   GEN: No acute distress.   Neck: No  JVD Cardiac: RRR, no murmur Respiratory:    CTA anteriorly    GI: Soft, nontender,  MS:   Trivial  LE edema. Neuro:  Nonfocal   Psych: Normal affect   Labs    Chemistry Recent Labs  Lab 01/04/24 1804 01/04/24 2307 01/05/24 1613 01/06/24 0209 01/07/24 0549 01/08/24 0818 01/09/24 0302  NA  --    < > 139   < > 141 136 134*  K  --    < > 3.8   < > 3.4* 3.8 3.8  CL  --    < > 103   < > 102 99 96*  CO2  --    < > 25   < > 28 27 27   GLUCOSE  --    < > 139*   < > 82 378* 451*  BUN  --    < > 11   < > 10 12 14   CREATININE  --    < > 0.99   < > 0.86 0.96 1.08  CALCIUM   --    < > 8.2*   < > 8.3* 8.3* 8.6*  PROT 6.0*  --   --   --   --   --   --   ALBUMIN 2.4*  --  2.6*  --   --   --   --  AST 29  --   --   --   --   --   --   ALT 18  --   --   --   --   --   --   ALKPHOS 57  --   --   --   --   --   --   BILITOT 0.4  --   --   --   --   --   --   GFRNONAA  --    < > >60   < > >60 >60 >60  ANIONGAP  --    < > 11   < > 11 10 11    < > = values in this interval not displayed.     Hematology Recent Labs  Lab 01/07/24 1553 01/08/24 0818 01/09/24 0302  WBC 9.4 7.4 7.5  RBC 4.56 4.32 4.66  HGB 13.3 12.7* 13.5  HCT 40.4 38.6* 41.0  MCV 88.6 89.4 88.0  MCH 29.2 29.4 29.0  MCHC 32.9 32.9 32.9  RDW 12.8 12.7 12.5  PLT 205 201 222    Cardiac EnzymesNo results for input(s): TROPONINI in the last 168 hours. No results for input(s): TROPIPOC in the last 168 hours.   BNP Recent Labs  Lab 01/04/24 1510  BNP 601.5*     DDimer No results for input(s): DDIMER in the last 168 hours.   Radiology    No results found.   Cardiac Studies  July 2025 echo  1. Left ventricular ejection fraction, by estimation, is <20%. The left  ventricle has severely decreased function. The left ventricle demonstrates  global hypokinesis. Left ventricular diastolic parameters are consistent  with Grade II diastolic  dysfunction (pseudonormalization).   2. Right ventricular systolic function is normal. The right ventricular  size is normal.   3. The mitral valve is normal in structure. Mild mitral valve   regurgitation. No evidence of mitral stenosis.   4. The aortic valve is tricuspid. Aortic valve regurgitation is not  visualized. No aortic stenosis is present.   5. The inferior vena cava is dilated in size with <50% respiratory  variability, suggesting right atrial pressure of 15 mmHg.    Patient Profile     54 y.o. male  with a hx of hypertension, hyperlipidemia, diabetes, cocaine use, chronic HFrEF, history of VT s/p Medtronic BiV ICD, tobacco use, OSA on CPAP, anxiety, depression who is being seen 01/04/2024 for the evaluation of acute on chronic HFrEF at the request of Dr. Isadora.   Assessment & Plan  HFrEF  Felt NICM  LVEF less than 20%   Pt hs diuresed sigfncantly    Volume appears OK He is on Entresto    Would add low dose spironolactone  and metoprolol  XL    Follow BP   Add today  Switch to 80 mg lasix  daily     Should be able to d/c tomorrow Will arrange for labs in 1 week and a TOC appt   ICD:   Was a no show for last check.    Interrogated in hospital.    HTN    BP is OK  Follow with addition of meds   Hx of CAD REported PCI in  Care Everywhere     Ca calcifications on CT  Pt denies CP   Rx risf factors  HL   Pt on statin   Will need to follow lipitds    Sleep apnea:  He has had CPAP in the past.  Hx of DVT   Provoked in past    Plan to follow    Would resume anticoagulation  if evidence of new clot   in future    Jehovah's Witness  Hx substance abuse Hx noncompliance   WIll sign off  Please call with questions   For questions or updates, please contact [facility] Please consult [email or web address] for contact info under Cardiology/STEMI.   Signed, Vina Gull, MD  01/09/2024, 7:12 AM

## 2024-01-09 NOTE — Progress Notes (Signed)
   01/09/24 9361  Provider Notification  Provider Name/Title Dr. Marcene  Date Provider Notified 01/09/24  Time Provider Notified [phone]  Method of Notification Page (secure chat)  Notification Reason Critical Result (CBG 410mg /dl)  Provider response No new orders;Other (Comment) (Give Insulin  Aspart 15 units according to sliding scale)  Date of Provider Response 01/09/24  Time of Provider Response [phone]

## 2024-01-09 NOTE — Progress Notes (Signed)
 [facility] PROGRESS NOTE  Luis [name] (DOB: 1971-01-04) FMW:[id number] PCP: [name]  Brief Narrative: Luis [name] is a 53 y.o. male Veteran with a history of chronic HFrEF, history of VT s/p Medtronic BiV ICD, tobacco use, OSA on CPAP, anxiety and depression, HTN, HLD, T2DM, cocaine use who presented to the ED on 01/04/2024 with respiratory failure and mixed shock (suspected septic and cardiogenic).   Subjective: Feels much better, eager to go home soon.   Objective: BP (!) 145/61 (BP Location: Right Arm)   Pulse (!) 105   Temp 98 F (36.7 C) (Oral)   Resp 20   Ht 5' 5 (1.651 m)   Wt 114.8 kg   SpO2 92%   BMI 42.12 kg/m   Gen: No distress, obese Pulm: Clear, nonlabored  CV: RRR, no MRG no JVD, trace LE edema GI: Soft, NT, ND, +BS  Neuro: Alert and oriented. No new focal deficits. Ext: Warm, no deformities. Skin: No open wounds on visualized skin   Assessment & Plan: Acute on chronic combined diastolic and systolic heart failure/nonischemic cardiomyopathy: LVEF 20%, G2DD.  - Defer to cardiology Re: GDMT, diuresis for today. May be approaching euvolemia.  - Follow BMP, volume assessment, I/O.  - Entresto  and Aldactone  to be added as part of GDMT   HTN:  - Currently stable - Continue to follow vital sign - Heart healthy/low-sodium diet discussed with patient.  CAD: s/p remote PCI. No anginal complaints.  - Continue statin and GDMT per cardiology.  No inpatient evaluation planned.    Class III obesity: Body mass index is 42.12 kg/m.  - Low-calorie diet, portion control and increase physical activity discussed with patient.   OSA:  - CPAP qHS  Peripheral neuropathy, left foot discomfort: Suspected to be diabetic in etiology. No UMN signs.  - Augment gabapentin  100mg  qHS > 300mg  qHS. Note his prescribed dose is 300mg  BID. - Uric acid wnl, exam is    HLD: - Continue statin   History of medication nonadherence:  - Education  and compliance importance discussed with patient - Advised to be compliant with appointments as an outpatient as well to further adjust medications as needed.   Aspiration pneumonia - Complete Tx with antibiotics today since showing such significant and sustained improvement. If duration were to be prolonged, would adjust to alternative/lower salt load agent.    T2DM:  - Continue SSI and basal insulin , augmented today.   Bernardino KATHEE Come, MD [facility] [email or web address] 01/09/2024, 11:21 AM

## 2024-01-10 ENCOUNTER — Other Ambulatory Visit (HOSPITAL_COMMUNITY): Payer: Self-pay

## 2024-01-10 DIAGNOSIS — J9601 Acute respiratory failure with hypoxia: Secondary | ICD-10-CM | POA: Diagnosis not present

## 2024-01-10 LAB — CBC
HCT: 45.2 % (ref 39.0–52.0)
Hemoglobin: 15 g/dL (ref 13.0–17.0)
MCH: 28.8 pg (ref 26.0–34.0)
MCHC: 33.2 g/dL (ref 30.0–36.0)
MCV: 86.9 fL (ref 80.0–100.0)
Platelets: 258 K/uL (ref 150–400)
RBC: 5.2 MIL/uL (ref 4.22–5.81)
RDW: 12.3 % (ref 11.5–15.5)
WBC: 11.3 K/uL — ABNORMAL HIGH (ref 4.0–10.5)
nRBC: 0 % (ref 0.0–0.2)

## 2024-01-10 LAB — MAGNESIUM: Magnesium: 2 mg/dL (ref 1.7–2.4)

## 2024-01-10 LAB — BASIC METABOLIC PANEL WITH GFR
Anion gap: 12 (ref 5–15)
BUN: 21 mg/dL — ABNORMAL HIGH (ref 6–20)
CO2: 26 mmol/L (ref 22–32)
Calcium: 9.2 mg/dL (ref 8.9–10.3)
Chloride: 95 mmol/L — ABNORMAL LOW (ref 98–111)
Creatinine, Ser: 1.07 mg/dL (ref 0.61–1.24)
GFR, Estimated: >60 mL/min (ref >60)
Glucose, Bld: 336 mg/dL — ABNORMAL HIGH (ref 70–99)
Potassium: 4 mmol/L (ref 3.5–5.1)
Sodium: 133 mmol/L — ABNORMAL LOW (ref 135–145)

## 2024-01-10 LAB — GLUCOSE, CAPILLARY
Glucose-Capillary: 450 mg/dL — ABNORMAL HIGH (ref 70–99)
Glucose-Capillary: 346 mg/dL — ABNORMAL HIGH (ref 70–99)

## 2024-01-10 MED ORDER — FUROSEMIDE 80 MG PO TABS
80.0000 mg | ORAL_TABLET | Freq: Every day | ORAL | 0 refills | Status: DC
  Filled 2024-01-10: qty 30, 30d supply, fill #0

## 2024-01-10 MED ORDER — SACUBITRIL-VALSARTAN 24-26 MG PO TABS
1.0000 | ORAL_TABLET | Freq: Two times a day (BID) | ORAL | 0 refills | Status: DC
  Filled 2024-01-10: qty 60, 30d supply, fill #0

## 2024-01-10 MED ORDER — METOPROLOL SUCCINATE ER 25 MG PO TB24
25.0000 mg | ORAL_TABLET | Freq: Two times a day (BID) | ORAL | 0 refills | Status: DC
  Filled 2024-01-10: qty 60, 30d supply, fill #0

## 2024-01-10 MED ORDER — SPIRONOLACTONE 25 MG PO TABS
25.0000 mg | ORAL_TABLET | Freq: Every day | ORAL | 0 refills | Status: DC
  Filled 2024-01-10: qty 30, 30d supply, fill #0

## 2024-01-10 MED ORDER — ROSUVASTATIN CALCIUM 10 MG PO TABS
10.0000 mg | ORAL_TABLET | Freq: Every day | ORAL | 0 refills | Status: DC
  Filled 2024-01-10: qty 30, 30d supply, fill #0

## 2024-01-10 NOTE — Plan of Care (Signed)
   Problem: Education: Goal: Knowledge of General Education information will improve Description: Including pain rating scale, medication(s)/side effects and non-pharmacologic comfort measures Outcome: Progressing   Problem: Health Behavior/Discharge Planning: Goal: Ability to manage health-related needs will improve Outcome: Progressing   Problem: Clinical Measurements: Goal: Will remain free from infection Outcome: Progressing

## 2024-01-10 NOTE — TOC CM/SW Note (Addendum)
 Transition of Care [facility]) - Inpatient Brief Assessment   Patient Details  Name: Luis [name] MRN: [id number] Date of Birth: [birth year]  Transition of Care [facility]) CM/SW Contact:    [name], RN Phone Number: 01/10/2024, 11:58 AM   Clinical Narrative: From home alone, has [facility]  [facility] ( Evelyn Cintron-Larenzo)and insurance on file, states has no HH services in place at this time, has rollator and a scooter at home  and home oxygen prn.  States Marko will transport them home at [employer] and family is support system,   Pta self ambulatory . Per pt eval rec HHPT, NCM offered choice, he chose [place]. NCM made referral to [facility] he is able to take referral.  Soc will begin 24 to 48 hrs post dc. Patient states he has [facility] (margaret) [phone] , NCM called her , left vm that patient will be dc today.  Patient also states Marko is his fiance and she has the keys to his house and she will need to pick him up and transport him home.  Left message for her to return call.  Patient goes to [place],  UTAH notified April with [facility] that patient is here, CSW is Nat Bird [phone] ext [phone] and PCP is Hargis Holter.   Patient states he will need [facility] for med management  also along with HHPT. Patient 's POA is [place] [phone]. Patient states this NCM can call her to let her know he is being discharged.  NCM called and left a message for return call.  Patient has been trying to get in touch with [name] who has the keys to his house .    Transition of Care Asessment: Insurance and Status: Insurance coverage has been reviewed Patient has primary care physician: Yes Mikle Eintron-Larenzo) Home environment has been reviewed: home alone Prior level of function:: indep Prior/Current Home Services: No current home services Social Drivers of Health Review: SDOH reviewed no interventions necessary Readmission risk has been reviewed: Yes Transition  of care needs: transition of care needs identified, TOC will continue to follow

## 2024-01-10 NOTE — Progress Notes (Signed)
 [facility] night cross cover note:   I was notified by the patient's RN that the patient is exhibiting suboptimal compliance with his current diet order for heart healthy/carb modified diet, noting that the patient's family continues to bring the patient outside food that is beyond of the scope of the aforementioned diet.     Eva Pore, DO Hospitalist

## 2024-01-10 NOTE — TOC Transition Note (Signed)
 Transition of Care [facility]) - Discharge Note   Patient Details  Name: Luis [name] MRN: [id number] Date of Birth: 07-08-1970  Transition of Care [facility]) CM/SW Contact:  [name], RN Phone Number: 01/10/2024, 12:49 PM   Clinical Narrative:    For dc today, NCM faxed HH orders to [place],  he is set up with [facility].  He states he does not have his house key to get into his home.  He is working on getting it.  NCM contacted the [facility] and left message for the Manager there  to call back.      Barriers to Discharge: Continued Medical Work up   Patient Goals and CMS Choice            Discharge Placement                       Discharge Plan and Services Additional resources added to the After Visit Summary for                                       Social Drivers of Health (SDOH) Interventions SDOH Screenings   Food Insecurity: No Food Insecurity (01/08/2024)  Housing: Low Risk  (01/08/2024)  Transportation Needs: No Transportation Needs (01/08/2024)  Utilities: Not At Risk (01/08/2024)  Alcohol  Screen: Low Risk  (12/04/2022)  Depression (PHQ2-9): Medium Risk (11/16/2022)  Financial Resource Strain: High Risk (11/16/2022)  Social Connections: Unknown (08/26/2022)   Received from [facility]  Stress: No Stress Concern Present (04/25/2023)   Received from [facility]  Tobacco Use: High Risk (01/04/2024)     Readmission Risk Interventions    01/10/2024   11:50 AM  Readmission Risk Prevention Plan  Transportation Screening Complete  Medication Review (RN Care Manager) Complete  PCP or Specialist appointment within 3-5 days of discharge Complete  HRI or Home Care Consult Complete  Palliative Care Screening Not Applicable  Skilled Nursing Facility Not Applicable

## 2024-01-10 NOTE — Progress Notes (Signed)
   01/10/24 9341  Provider Notification  Provider Name/Title Dr. Marcene  Date Provider Notified 01/10/24  Time Provider Notified [phone]  Method of Notification Page (secure chat)  Notification Reason Critical Result (CBG 450)  Provider response No new orders (give 20 units insulin  lispro)  Date of Provider Response 01/10/24  Time of Provider Response [phone]

## 2024-01-10 NOTE — Discharge Summary (Signed)
 Physician Discharge Summary   Patient: Luis [name] MRN: [id number] DOB: 01-Jul-1970  Admit date:     01/04/2024  Discharge date: 01/10/24  Discharge Physician: Bernardino KATHEE Come   PCP: [name]   Recommendations at discharge:   Follow up with cardiology in 1 week after discharge with repeat labs. Note patient reports he wishes to follow at [facility].  Follow up with PCP per routine in 1-2 weeks. Patient hoping to establish care with pain medicine specialist, will need PCP referral. No controlled prescriptions provided at discharge.   Discharge Diagnoses: Principal Problem:   Respiratory failure with hypoxia (HCC) Active Problems:   Acute respiratory failure with hypoxia and hypercapnia (HCC)   HFrEF (heart failure with reduced ejection fraction) (HCC)   Acute heart failure with reduced ejection fraction (HFrEF, <= 40%) (HCC)   Flash pulmonary edema (HCC)   Toxic metabolic encephalopathy   Malnutrition of moderate degree   Aspiration pneumonia due to gastric secretions (HCC)  Luis [name] is a 53 y.o. male [place] with a history of chronic HFrEF, history of VT s/p Medtronic BiV ICD, tobacco use, OSA on CPAP, anxiety and depression, HTN, HLD, T2DM, cocaine use who presented to the ED on 01/04/2024 with respiratory failure and mixed shock (suspected septic and cardiogenic). Respiratory status has improved with antibiotics and HF therapies. Vital signs have stabilized, and GDMT has been initiated.   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  - Defer to cardiology Re: GDMT, diuresis for today. May be approaching euvolemia.  - Follow BMP, volume assessment, I/O.  - Entresto  and Aldactone  to be added as part of GDMT   HTN:  - Currently stable - Continue to follow vital sign - Heart healthy/low-sodium diet discussed with patient.   CAD: s/p remote PCI. No anginal complaints.  -  Continue statin and GDMT per cardiology.  No inpatient evaluation planned.    Class III obesity: Body mass index is 42.12 kg/m.  - Low-calorie diet, portion control and increase physical activity discussed with patient.   OSA:  - CPAP qHS   Peripheral neuropathy, left foot discomfort: Suspected to be diabetic in etiology. No UMN signs.  - Augment gabapentin  100mg  qHS > 300mg  qHS. Note his prescribed dose is 300mg  BID. - Uric acid wnl, exam is not suggestive of infection or acute gout    HLD: - Continue statin    History of medication nonadherence:  - Education and compliance importance discussed with patient - Advised to be compliant with appointments as an outpatient as well to further adjust medications as needed.   Aspiration pneumonia - Completed Tx with antibiotics today     T2DM:  - Continue SSI and basal insulin , augmented today.   Consultants: Cardiology Procedures performed: None  Disposition: Home Diet recommendation: Heart healthy, carb-modified DISCHARGE MEDICATION: Allergies as of 01/10/2024       Reactions   Aspirin Anaphylaxis, Other (See Comments)   Throat closing   Iodine-131 Hives   Peanut (diagnostic) Anaphylaxis   Throat closes   Iodinated Contrast Media Hives   Can be premedicated    Latex Hives, Rash   Penicillins Nausea And Vomiting   Ultram  [tramadol ] Hives, Nausea Only   Zestril [lisinopril] Cough   Cinnamon    Amoxicillin-pot Clavulanate Diarrhea   Lidocaine  Rash   Rash from adhesive on lidocaine  patches         Medication List     STOP taking these  medications    bumetanide  2 MG tablet Commonly known as: BUMEX    carvedilol  3.125 MG tablet Commonly known as: COREG    clindamycin 150 MG capsule Commonly known as: CLEOCIN   clopidogrel  75 MG tablet Commonly known as: PLAVIX    digoxin  0.125 MG tablet Commonly known as: LANOXIN    doxycycline  100 MG capsule Commonly known as: VIBRAMYCIN    DULoxetine  30 MG capsule Commonly  known as: CYMBALTA    Farxiga  10 MG Tabs tablet Generic drug: dapagliflozin  propanediol   HYDROcodone -acetaminophen  5-325 MG tablet Commonly known as: NORCO/VICODIN   hydrOXYzine  25 MG capsule Commonly known as: VISTARIL    hydrOXYzine  25 MG tablet Commonly known as: ATARAX    isoniazid  300 MG tablet Commonly known as: NYDRAZID    mirtazapine 15 MG tablet Commonly known as: REMERON   ondansetron  4 MG disintegrating tablet Commonly known as: ZOFRAN -ODT   oxyCODONE -acetaminophen  5-325 MG tablet Commonly known as: PERCOCET/ROXICET   pantoprazole  40 MG tablet Commonly known as: PROTONIX    potassium chloride  SA 20 MEQ tablet Commonly known as: KLOR-CON  M   prazosin  2 MG capsule Commonly known as: MINIPRESS    torsemide  20 MG tablet Commonly known as: DEMADEX    traZODone  100 MG tablet Commonly known as: DESYREL        TAKE these medications    BD Pen Needle Nano U/F 32G X 4 MM Misc Generic drug: Insulin  Pen Needle Use as directed up to four times daily   cholecalciferol 25 MCG (1000 UNIT) tablet Commonly known as: VITAMIN D3 Take 1,000 Units by mouth daily.   diclofenac  Sodium 1 % Gel Commonly known as: VOLTAREN  Apply 1 Application topically 2 (two) times daily as needed (pain).   furosemide  80 MG tablet Commonly known as: LASIX  Take 1 tablet (80 mg total) by mouth daily. Start taking on: January 11, 2024   gabapentin  300 MG capsule Commonly known as: NEURONTIN  Take 1 capsule (300 mg total) by mouth 2 (two) times daily. What changed: when to take this   Lantus  SoloStar 100 UNIT/ML Solostar Pen Generic drug: insulin  glargine Inject 38 Units into the skin 2 (two) times daily.   loratadine  10 MG tablet Commonly known as: CLARITIN  Take 10 mg by mouth daily.   metoprolol  succinate 25 MG 24 hr tablet Commonly known as: TOPROL -XL Take 1 tablet (25 mg total) by mouth 2 (two) times daily with a meal. What changed:  medication strength how much to take when to  take this   NovoLOG  FlexPen 100 UNIT/ML FlexPen Generic drug: insulin  aspart Inject 15 Units into the skin 3 (three) times daily with meals. What changed: how much to take   pyridOXINE  100 MG tablet Commonly known as: VITAMIN B6 Take 1 tablet (100 mg total) by mouth daily.   rosuvastatin  10 MG tablet Commonly known as: CRESTOR  Take 1 tablet (10 mg total) by mouth daily. Start taking on: January 11, 2024 What changed:  medication strength how much to take   sacubitril -valsartan  24-26 MG Commonly known as: Entresto  Take 1 tablet by mouth 2 (two) times daily. What changed: Another medication with the same name was removed. Continue taking this medication, and follow the directions you see here.   spironolactone  25 MG tablet Commonly known as: ALDACTONE  Take 1 tablet (25 mg total) by mouth daily.   thiamine  100 MG tablet Commonly known as: VITAMIN B1 Take 1 tablet (100 mg total) by mouth daily.        Follow-up Information     [facility] Follow up.   Contact information:  [address] [place] [address] [phone]                Discharge Exam: Filed Weights   01/07/24 1506 01/09/24 0500 01/10/24 0450  Weight: 114.4 kg 114.8 kg 115.6 kg  BP 130/80 (BP Location: Right Arm)   Pulse (!) 104   Temp 98.6 F (37 C) (Oral)   Resp 18   Ht 5' 5 (1.651 m)   Wt 115.6 kg   SpO2 93%   BMI 42.41 kg/m   Chronically ill-appearing male in no distress, family at bedside agrees he appears improved.  Right conjunctival erythema which is baseline, legally blind chronically  Clear, nonlabored RRR, no MRG, trace LE edema much improved Alert, oriented, non focal Feet without erythema, or visible or palpable abnormality.   Condition at discharge: stable  The results of significant diagnostics from this hospitalization (including imaging, microbiology, ancillary and laboratory) are listed below for reference.   Imaging Studies: DG  CHEST PORT 1 VIEW Result Date: 01/06/2024 CLINICAL DATA:  Respiratory failure EXAM: PORTABLE CHEST 1 VIEW COMPARISON:  None Available. FINDINGS: Endotracheal tube and NG tube unchanged. LEFT-sided pacer. Low lung volumes. Mild central venous congestion. No significant change. IMPRESSION: 1. Stable support apparatus. 2. No significant interval change. Electronically Signed   By: Jackquline Boxer M.D.   On: 01/06/2024 08:39   DG Abd 1 View Result Date: 01/05/2024 CLINICAL DATA:  Orogastric tube placement. EXAM: ABDOMEN - 1 VIEW COMPARISON:  January 04, 2024. FINDINGS: Distal tip of nasogastric tube is seen in expected position of distal stomach. IMPRESSION: Nasogastric tube tip seen in expected position of distal stomach. Electronically Signed   By: Lynwood Landy Raddle M.D.   On: 01/05/2024 14:50   DG CHEST PORT 1 VIEW Result Date: 01/05/2024 CLINICAL DATA:  Respiratory failure with hypoxia. EXAM: PORTABLE CHEST 1 VIEW COMPARISON:  January 04, 2024. FINDINGS: Stable cardiomegaly. Endotracheal and nasogastric tubes are unchanged. Left-sided defibrillator is unchanged. Bibasilar atelectasis or edema is noted with probable small pleural effusions. Bony thorax is unremarkable. IMPRESSION: Stable support apparatus. Bibasilar atelectasis or edema is noted with probable small pleural effusions. Electronically Signed   By: Lynwood Landy Raddle M.D.   On: 01/05/2024 14:49   ECHOCARDIOGRAM COMPLETE Result Date: 01/05/2024    ECHOCARDIOGRAM REPORT   Patient Name:   Luis [name] Date of Exam: 01/05/2024 Medical Rec #:  [id number]             Height:       65.0 in Accession #:    [id number]            Weight:       199.3 lb Date of Birth:  24-Apr-1971              BSA:          1.975 m Patient Age:    53 years              BP:           122/66 mmHg Patient Gender: M                     HR:           82 bpm. Exam Location:  Inpatient Procedure: 2D Echo, Cardiac Doppler and Color Doppler (Both Spectral and Color            Flow Doppler  were utilized during procedure). Indications:    Congestive Heart Failure I50.9  History:        Patient has prior history of Echocardiogram examinations, most                 recent 06/14/2023. CHF and Cardiomyopathy, Defibrillator; Risk                 Factors:Hypertension and Diabetes.  Sonographer:    Jayson Gaskins Referring Phys: [id number] KHABIB DGAYLI IMPRESSIONS  1. Left ventricular ejection fraction, by estimation, is <20%. The left ventricle has severely decreased function. The left ventricle demonstrates global hypokinesis. Left ventricular diastolic parameters are consistent with Grade II diastolic dysfunction (pseudonormalization).  2. Right ventricular systolic function is normal. The right ventricular size is normal.  3. The mitral valve is normal in structure. Mild mitral valve regurgitation. No evidence of mitral stenosis.  4. The aortic valve is tricuspid. Aortic valve regurgitation is not visualized. No aortic stenosis is present.  5. The inferior vena cava is dilated in size with <50% respiratory variability, suggesting right atrial pressure of 15 mmHg. FINDINGS  Left Ventricle: Left ventricular ejection fraction, by estimation, is <20%. The left ventricle has severely decreased function. The left ventricle demonstrates global hypokinesis. The left ventricular internal cavity size was normal in size. There is no  left ventricular hypertrophy. Left ventricular diastolic parameters are consistent with Grade II diastolic dysfunction (pseudonormalization). Right Ventricle: The right ventricular size is normal. Right ventricular systolic function is normal. Left Atrium: Left atrial size was normal in size. Right Atrium: Right atrial size was normal in size. Pericardium: Trivial pericardial effusion is present. Mitral Valve: The mitral valve is normal in structure. Mild mitral valve regurgitation. No evidence of mitral valve stenosis. Tricuspid Valve: The tricuspid valve is normal in structure. Tricuspid  valve regurgitation is not demonstrated. No evidence of tricuspid stenosis. Aortic Valve: The aortic valve is tricuspid. Aortic valve regurgitation is not visualized. No aortic stenosis is present. Aortic valve mean gradient measures 5.0 mmHg. Aortic valve peak gradient measures 8.5 mmHg. Aortic valve area, by VTI measures 1.82 cm. Pulmonic Valve: The pulmonic valve was grossly normal. Pulmonic valve regurgitation is not visualized. No evidence of pulmonic stenosis. Aorta: The aortic root is normal in size and structure. Venous: The inferior vena cava is dilated in size with less than 50% respiratory variability, suggesting right atrial pressure of 15 mmHg. IAS/Shunts: No atrial level shunt detected by color flow Doppler. Additional Comments: A device lead is visualized.  LEFT VENTRICLE PLAX 2D LVIDd:         5.00 cm   Diastology LVIDs:         4.30 cm   LV e' medial:    7.51 cm/s LV PW:         1.30 cm   LV E/e' medial:  12.6 LV IVS:        0.90 cm   LV e' lateral:   4.57 cm/s LVOT diam:     1.90 cm   LV E/e' lateral: 20.8 LV SV:         45 LV SV Index:   23 LVOT Area:     2.84 cm  RIGHT VENTRICLE             IVC RV S prime:     12.70 cm/s  IVC diam: 2.80 cm TAPSE (M-mode): 1.9 cm LEFT ATRIUM           Index        RIGHT ATRIUM  Index LA Vol (A2C): 43.3 ml 21.92 ml/m  RA Area:     19.50 cm LA Vol (A4C): 27.7 ml 14.02 ml/m  RA Volume:   52.10 ml  26.38 ml/m  AORTIC VALVE AV Area (Vmax):    1.84 cm AV Area (Vmean):   2.02 cm AV Area (VTI):     1.82 cm AV Vmax:           146.00 cm/s AV Vmean:          111.000 cm/s AV VTI:            0.245 m AV Peak Grad:      8.5 mmHg AV Mean Grad:      5.0 mmHg LVOT Vmax:         95.00 cm/s LVOT Vmean:        79.000 cm/s LVOT VTI:          0.157 m LVOT/AV VTI ratio: 0.64  AORTA Ao Root diam: 2.80 cm MITRAL VALVE MV Area (PHT): 4.60 cm    SHUNTS MV Decel Time: 165 msec    Systemic VTI:  0.16 m MV E velocity: 94.90 cm/s  Systemic Diam: 1.90 cm MV A velocity: 73.40  cm/s MV E/A ratio:  1.29 Redell Shallow MD Electronically signed by Redell Shallow MD Signature Date/Time: 01/05/2024/8:02:20 AM    Final    DG Abd Portable 1V Result Date: 01/04/2024 CLINICAL DATA:  Enteric tube placement EXAM: PORTABLE ABDOMEN - 1 VIEW COMPARISON:  CT abdomen and pelvis dated 12/07/2022 FINDINGS: Gastric/enteric tube tip projects over the right lower quadrant, likely related to patient positioning and projectional technique. Partially imaged ICD leads project over the right atrium, right ventricle, and tributary of the coronary sinus. Partially imaged dense left retrocardiac opacity with air bronchograms. IMPRESSION: Gastric/enteric tube tip projects over the presumed distal stomach. Electronically Signed   By: Limin  Xu M.D.   On: 01/04/2024 16:56   DG CHEST PORT 1 VIEW Result Date: 01/04/2024 CLINICAL DATA:  [id number] Endotracheally intubated [id number] EXAM: PORTABLE CHEST - 1 VIEW COMPARISON:  January 04, 2024 1:18 p.m. FINDINGS: Well-positioned endotracheal tube terminating in the mid trachea. Esophagogastric tube courses below the diaphragm with the distal tip outside the field of view. Left chest pacemaker/AICD with leads terminating in the right atrium, right ventricle, and coronary sinus. Increasing bilateral perihilar interstitial opacities with patchy airspace opacities in both lung bases. No pleural effusion or pneumothorax. Mild cardiomegaly. IMPRESSION: 1. Well-positioned endotracheal tube terminating in the mid trachea. No pneumothorax. While the esophagogastric tube courses below the diaphragm, the tip is not included in the field of view. 2. Cardiomegaly with findings of worsening pulmonary edema. Electronically Signed   By: Rogelia Myers M.D.   On: 01/04/2024 15:25   DG Chest Port 1 View Result Date: 01/04/2024 CLINICAL DATA:  Dyspnea. EXAM: PORTABLE CHEST 1 VIEW COMPARISON:  09/15/2023. FINDINGS: Stable cardiomegaly. Stable left chest wall multi lead pacing device. Bilateral  perihilar interstitial prominence, most pronounced at the lung bases, is suggestive of pulmonary vascular congestion with possible mild pulmonary interstitial edema. No focal consolidation, pleural effusion, or pneumothorax. No acute osseous abnormality. IMPRESSION: Cardiomegaly with pulmonary vascular congestion and possible mild pulmonary interstitial edema. Electronically Signed   By: Harrietta Sherry M.D.   On: 01/04/2024 13:48    Microbiology: Results for orders placed or performed during the hospital encounter of 01/04/24  MRSA Next Gen by PCR, Nasal     Status: Abnormal   Collection Time: 01/04/24  2:39  PM   Specimen: Nasal Mucosa; Nasal Swab  Result Value Ref Range Status   MRSA by PCR Next Gen DETECTED (A) NOT DETECTED Final    Comment: RESULT CALLED TO, READ BACK BY AND VERIFIED WITH: RN LUKE BUDGE ON 01/04/24 @ 1733 BY DRT (NOTE) The GeneXpert MRSA Assay (FDA approved for NASAL specimens only), is one component of a comprehensive MRSA colonization surveillance program. It is not intended to diagnose MRSA infection nor to guide or monitor treatment for MRSA infections. Test performance is not FDA approved in patients less than 79 years old. Performed at [facility], [address] [place], [place] [address]   Culture, blood (Routine X 2) w Reflex to ID Panel     Status: None   Collection Time: 01/04/24  4:33 PM   Specimen: BLOOD  Result Value Ref Range Status   Specimen Description BLOOD SITE NOT SPECIFIED  Final   Special Requests   Final    BOTTLES DRAWN AEROBIC ONLY Blood Culture adequate volume   Culture   Final    NO GROWTH 5 DAYS Performed at [facility], [address], [place], [place] [address]    Report Status 01/09/2024 FINAL  Final  Culture, blood (single) w Reflex to ID Panel     Status: None   Collection Time: 01/04/24  4:33 PM   Specimen: BLOOD  Result Value Ref Range Status   Specimen Description BLOOD SITE NOT SPECIFIED  Final   Special  Requests   Final    BOTTLES DRAWN AEROBIC ONLY Blood Culture adequate volume   Culture   Final    NO GROWTH 5 DAYS Performed at [facility], [address] [place], [place] [address]    Report Status 01/09/2024 FINAL  Final  Culture, blood (Routine X 2) w Reflex to ID Panel     Status: None   Collection Time: 01/04/24  4:34 PM   Specimen: BLOOD RIGHT HAND  Result Value Ref Range Status   Specimen Description BLOOD RIGHT HAND  Final   Special Requests   Final    BOTTLES DRAWN AEROBIC AND ANAEROBIC Blood Culture results may not be optimal due to an inadequate volume of blood received in culture bottles   Culture   Final    NO GROWTH 5 DAYS Performed at [facility], [address], [place], [place] [address]    Report Status 01/09/2024 FINAL  Final  Culture, Respiratory w Gram Stain (tracheal aspirate)     Status: None   Collection Time: 01/05/24 10:28 AM   Specimen: Tracheal Aspirate; Respiratory  Result Value Ref Range Status   Specimen Description TRACHEAL ASPIRATE  Final   Special Requests NONE  Final   Gram Stain   Final    ABUNDANT WBC PRESENT, PREDOMINANTLY PMN RARE GRAM POSITIVE COCCI IN PAIRS    Culture   Final    MODERATE GROUP B STREP(S.AGALACTIAE)ISOLATED TESTING AGAINST S. AGALACTIAE NOT ROUTINELY PERFORMED DUE TO PREDICTABILITY OF AMP/PEN/VAN SUSCEPTIBILITY. Performed at [facility], [address] [place], [place] [address]    Report Status 01/06/2024 FINAL  Final    Labs: CBC: Recent Labs  Lab 01/05/24 0851 01/05/24 0958 01/06/24 0839 01/06/24 1115 01/07/24 1553 01/08/24 0818 01/09/24 0302 01/10/24 0848  WBC 11.5* 11.2*  --   --  9.4 7.4 7.5 11.3*  NEUTROABS 8.2* 8.1*  --   --   --   --   --   --  HGB 13.6 13.6   < > 15.0 13.3 12.7* 13.5 15.0  HCT 42.8 42.5   < > 44.0 40.4 38.6* 41.0 45.2  MCV 91.8 92.2  --   --  88.6 89.4 88.0 86.9  PLT 191 193  --   --  205 201 222 258   < > = values in this interval not displayed.    Basic Metabolic Panel: Recent Labs  Lab 01/05/24 1613 01/05/24 1616 01/06/24 0209 01/06/24 0839 01/06/24 1115 01/07/24 0549 01/08/24 0818 01/09/24 0302 01/10/24 0848  NA 139  --  138   < > 139 141 136 134* 133*  K 3.8  --  3.5   < > 3.7 3.4* 3.8 3.8 4.0  CL 103  --  102  --   --  102 99 96* 95*  CO2 25  --  29  --   --  28 27 27 26   GLUCOSE 139*  --  189*  --   --  82 378* 451* 336*  BUN 11  --  10  --   --  10 12 14  21*  CREATININE 0.99  --  1.01  --   --  0.86 0.96 1.08 1.07  CALCIUM  8.2*  --  8.1*  --   --  8.3* 8.3* 8.6* 9.2  MG  --  1.9 1.9  --   --  2.0 1.8 1.7 2.0  PHOS 3.9 3.8 4.0  --   --  4.5 3.3  --   --    < > = values in this interval not displayed.   Liver Function Tests: Recent Labs  Lab 01/04/24 1804 01/05/24 1613  AST 29  --   ALT 18  --   ALKPHOS 57  --   BILITOT 0.4  --   PROT 6.0*  --   ALBUMIN 2.4* 2.6*   CBG: Recent Labs  Lab 01/09/24 0624 01/09/24 1246 01/09/24 1656 01/09/24 2015 01/10/24 0651  GLUCAP 410* 455* 329* 131* 450*    Discharge time spent: greater than 30 minutes.  Signed: Bernardino KATHEE Come, MD [facility] 01/10/2024

## 2024-01-20 ENCOUNTER — Encounter (HOSPITAL_COMMUNITY)

## 2024-01-20 NOTE — Progress Notes (Incomplete)
 PCP: [facility] Cardiologist: Dr. Anner HF Cardiologist: Dr. Rolan  Chief Complaint:   HPI: Luis [name] is a 53 y.o. male with h/o chronic systolic heart failure, VT s/p ICD (MDT CRT-D), HTN, HLD, Type 2 DM, OSA on CPAP and h/o substance use (cocaine).   He has a reported h/o CAD/PCI in Care Everywhere though his cardiomyopathy has been classified as NICM. Unfortunately, I am unable to locate LHC/ angiographic details in Care Everywhere. Echos from 2021-2023 have shown LVEF ranging from 25-35%, RV mildly reduced.  Cardiomyopathy felt likely caused by cocaine use.    Admitted 9/23 for acute on chronic systolic CHF.  ICD was also noted to be at [facility]. Echo showed EF 25% w/ global HK, RV mildly reduced. He was diuresed w/ IV Lasix  and underwent RHC revealing normal filling pressures and reduced CO w/ CI of 1.82 L/min/m. He was placed on GDMT and transitioned to PO torsemide . Underwent gen change on 03/23/22 prior to d/c. D/c wt 296 lb. Referred to [facility] clinic.   Seen in [facility] on 04/14/22 w/ NYHA Class IIIb symptoms and fluid overload. Diuretics increased set up for RHC.    R/LHC (10/23) showed elevated filling pressures and low output. mRA 14, PAP 45/22, mPCWP 27, LVEDP 32, PA sat 60%, CI 1.77. LHC showed no significant CAD. He was directly admitted from cath lab for IV diuresis and initiation of inotropic support w/ milrinone . Milrinone  weaned and GDMT titrated. Found to have RUE DVT, PICC removed and placed on Eliquis . Felt not to be a LVAD/transplant candidate due to OHS/OSA, poor mobility and social situation. Discharged home, weight 283 lbs.  He has not been seen in HF clinic since 11/23. Patient was admitted earlier this month with acute respiratory failure requiring intubation in setting of acute on chronic CHF. He had been noncompliant with HF medications. Echo with LVEF less than 20. He was diuresed and GDMT restarted. Discharge weight 254 lb.  He is here today for  post-hospital CHF follow-up.  ROS: All systems negative except as listed in HPI, PMH and Problem List.  SH:  Social History   Socioeconomic History   Marital status: Widowed    Spouse name: Not on file   Number of children: 7   Years of education: Not on file   Highest education level: Bachelor's degree (e.g., BA, AB, BS)  Occupational History   Occupation: disability  Tobacco Use   Smoking status: Every Day    Current packs/day: 0.00    Average packs/day: 0.5 packs/day for 15.0 years (7.5 ttl pk-yrs)    Types: Cigarettes    Start date: 10/2006    Last attempt to quit: 10/2021    Years since quitting: 2.2   Smokeless tobacco: Never   Tobacco comments:    occasionally  Vaping Use   Vaping status: Never Used  Substance and Sexual Activity   Alcohol  use: Not Currently   Drug use: Yes    Types: Cocaine, Marijuana    Comment: using in binges, 1/4gm daily x 2 weeks.   Sexual activity: Not Currently  Other Topics Concern   Not on file  Social History Narrative   Not on file   Social Drivers of Health   Financial Resource Strain: High Risk (11/16/2022)   Overall Financial Resource Strain (CARDIA)    Difficulty of Paying Living Expenses: Hard  Food Insecurity: No Food Insecurity (01/08/2024)   Hunger Vital Sign    Worried About Running Out of Food in the Last Year:  Never true    Ran Out of Food in the Last Year: Never true  Transportation Needs: No Transportation Needs (01/08/2024)   PRAPARE - Administrator, Civil Service (Medical): No    Lack of Transportation (Non-Medical): No  Physical Activity: Not on file  Stress: No Stress Concern Present (04/25/2023)   Received from [facility] - Occupational Stress Questionnaire    Feeling of Stress : Not at all  Social Connections: Unknown (08/26/2022)   Received from [facility]   Social Network    Social Network: Not on file  Intimate Partner Violence: Not At Risk  (01/08/2024)   Humiliation, Afraid, Rape, and Kick questionnaire    Fear of Current or Ex-Partner: No    Emotionally Abused: No    Physically Abused: No    Sexually Abused: No    FH:  Family History  Problem Relation Age of Onset   Hypertension Mother    Bone cancer Father    Lung cancer Father     Past Medical History:  Diagnosis Date   Anxiety    Biventricular ICD (implantable cardioverter-defibrillator) in place    Chronic systolic CHF (congestive heart failure) (HCC)    Cocaine use    Diabetes mellitus without complication (HCC)    GERD (gastroesophageal reflux disease)    HLD (hyperlipidemia)    Hypertension    Morbid obesity (HCC)    NICM (nonischemic cardiomyopathy) (HCC)    OSA (obstructive sleep apnea)    PTSD (post-traumatic stress disorder)    on Depakote    Refusal of blood transfusions as patient is Jehovah's Witness    Tobacco abuse     Current Outpatient Medications  Medication Sig Dispense Refill   cholecalciferol (VITAMIN D3) 25 MCG (1000 UNIT) tablet Take 1,000 Units by mouth daily.     diclofenac  Sodium (VOLTAREN ) 1 % GEL Apply 1 Application topically 2 (two) times daily as needed (pain).     furosemide  (LASIX ) 80 MG tablet Take 1 tablet (80 mg total) by mouth daily. 30 tablet 0   gabapentin  (NEURONTIN ) 300 MG capsule Take 1 capsule (300 mg total) by mouth 2 (two) times daily. (Patient taking differently: Take 300 mg by mouth at bedtime.) 60 capsule 0   insulin  aspart (NOVOLOG  FLEXPEN) 100 UNIT/ML FlexPen Inject 15 Units into the skin 3 (three) times daily with meals. (Patient taking differently: Inject 20 Units into the skin 3 (three) times daily with meals.) 15 mL 11   insulin  glargine (LANTUS ) 100 UNIT/ML Solostar Pen Inject 38 Units into the skin 2 (two) times daily. 15 mL 11   Insulin  Pen Needle 32G X 4 MM MISC Use as directed up to four times daily 100 each 0   loratadine  (CLARITIN ) 10 MG tablet Take 10 mg by mouth daily.     metoprolol  succinate  (TOPROL -XL) 25 MG 24 hr tablet Take 1 tablet (25 mg total) by mouth 2 (two) times daily with a meal. 60 tablet 0   pyridoxine  (B-6) 100 MG tablet Take 1 tablet (100 mg total) by mouth daily. 30 tablet 0   rosuvastatin  (CRESTOR ) 10 MG tablet Take 1 tablet (10 mg total) by mouth daily. 30 tablet 0   sacubitril -valsartan  (ENTRESTO ) 24-26 MG Take 1 tablet by mouth 2 (two) times daily. 60 tablet 0   spironolactone  (ALDACTONE ) 25 MG tablet Take 1 tablet (25 mg total) by mouth daily. 30 tablet 0   thiamine  (VITAMIN B1) 100 MG tablet Take  1 tablet (100 mg total) by mouth daily. 30 tablet 0   No current facility-administered medications for this visit.    There were no vitals filed for this visit.  PHYSICAL EXAM:  General:  Well appearing. No resp difficulty HEENT: normal Neck: supple. JVP flat. Carotids 2+ bilaterally; no bruits. No lymphadenopathy or thryomegaly appreciated. Cor: PMI normal. Regular rate & rhythm. No rubs, gallops or murmurs. Lungs: clear Abdomen: soft, nontender, nondistended. No hepatosplenomegaly. No bruits or masses. Good bowel sounds. Extremities: no cyanosis, clubbing, rash, edema Neuro: alert & orientedx3, cranial nerves grossly intact. Moves all 4 extremities w/o difficulty. Affect pleasant.   ECG:    ASSESSMENT & PLAN:  1. Chronic Systolic CHF:  Nonischemic cardiomyopathy.  MDT CRT-D device.  Echoes 2021-2023, LVEF range 25-35%, RV mildly reduced.  Reportedly diagnosed with nonischemic cardiomyopathy in 2012 in [place] , etiology felt due to cocaine abuse.  [facility] cardiology 03/07/2019 notes mention patient has CAD with history of angioplasty in the past. Unable to locate cath reports/ details regarding coronary anatomy in record. Most recent echo in 2/23 showed EF 25%, mild LV dilation, mildly decreased RV function. RHC in 9/23 showed low CI at 1.82. RHC (10/23) showed elevated filling pressures and low output, mRA 14, PAP 45/22, mPCWP 27, LVEDP 32, PA sat 60%, CI  1.77. LHC showed no significant coronary disease. Echo 7/25: EF < 20%, RV okay - NYHA ***. Volume. Continue lasix  *** -Recent admit for acute on chronic CHF in setting of noncompliance with medications - Continue spiro 25 mg daily - Continue Entresto  24/26 mg BID - Continue metoprolol  xl 25 mg BID - Restart Farxiga  once A1c improved, recently 14 - Suspect that with OHS/OSA, obesity, and his poor mobility that he will be a poor candidate for LVAD/transplant.  2. CAD: Documented in Care Everywhere, reported h/o angioplasty, details unknown (cannot find a cath report though prior CAD is referenced). LHC showed no significant CAD. No chest pain. - Plavix  stopped since Eliquis  initiated.    - Continue Crestor   3. H/o VT: Has CRT-D w/ chronic systolic heart failure and QRS > 150 ms  - Followed by [facility] device clinic  4. HTN: Mildly elevated.  - Increase Entresto  as above. 5. Type 2DM: Uncontrolled, recent A1c 14. - He is on insulin . 6. Cocaine: Says he has quit for months now.    7. Depression: Depressed due to lack of social/family support and food insecurity  - Continue Cymbalta .  8. Jehovah's Witness.  9. OHS/OSA: He uses oxygen during the day and CPAP at night.  This likely plays a role in his dyspnea.  10. H/o AKI: Last SCr 1.27. BMET today. 11. RUE DVT: Provoked.  - Continue Eliquis5 mg bid.  12. SDOH: Engage HFSW to help with food stamps and social security.  Follow-up: ***

## 2024-01-24 ENCOUNTER — Ambulatory Visit: Admitting: Cardiology

## 2024-01-24 ENCOUNTER — Telehealth (HOSPITAL_COMMUNITY): Payer: Self-pay

## 2024-01-24 NOTE — Telephone Encounter (Signed)
 Called to confirm/remind patient of their appointment at the [facility] on 01/25/2024.   Appointment:   [] Confirmed  [] Left mess   [] No answer/No voice mail  [] VM Full/unable to leave message  [x] Phone not in service  Patient reminded to bring all medications and/or complete list.  Confirmed patient has transportation. Gave directions, instructed to utilize valet parking.

## 2024-01-25 ENCOUNTER — Encounter (HOSPITAL_COMMUNITY): Payer: Self-pay

## 2024-01-25 ENCOUNTER — Ambulatory Visit (HOSPITAL_COMMUNITY): Payer: Self-pay | Admitting: Physician Assistant

## 2024-01-25 ENCOUNTER — Telehealth: Payer: Self-pay

## 2024-01-25 ENCOUNTER — Ambulatory Visit (HOSPITAL_COMMUNITY)
Admission: RE | Admit: 2024-01-25 | Discharge: 2024-01-25 | Disposition: A | Discharge status: Home / Self Care | Payer: Self-pay | Source: Ambulatory Visit | Attending: Physician Assistant

## 2024-01-25 VITALS — BP 152/82 | HR 96 | Ht 65.0 in | Wt 249.0 lb

## 2024-01-25 DIAGNOSIS — Z9981 Dependence on supplemental oxygen: Secondary | ICD-10-CM | POA: Diagnosis not present

## 2024-01-25 DIAGNOSIS — Z5986 Financial insecurity: Secondary | ICD-10-CM | POA: Diagnosis not present

## 2024-01-25 DIAGNOSIS — Z5941 Food insecurity: Secondary | ICD-10-CM | POA: Insufficient documentation

## 2024-01-25 DIAGNOSIS — Z886 Allergy status to analgesic agent status: Secondary | ICD-10-CM | POA: Insufficient documentation

## 2024-01-25 DIAGNOSIS — I428 Other cardiomyopathies: Secondary | ICD-10-CM | POA: Insufficient documentation

## 2024-01-25 DIAGNOSIS — I2510 Atherosclerotic heart disease of native coronary artery without angina pectoris: Secondary | ICD-10-CM | POA: Diagnosis not present

## 2024-01-25 DIAGNOSIS — Z91148 Patient's other noncompliance with medication regimen for other reason: Secondary | ICD-10-CM | POA: Diagnosis not present

## 2024-01-25 DIAGNOSIS — I5022 Chronic systolic (congestive) heart failure: Secondary | ICD-10-CM

## 2024-01-25 DIAGNOSIS — E785 Hyperlipidemia, unspecified: Secondary | ICD-10-CM | POA: Diagnosis not present

## 2024-01-25 DIAGNOSIS — I110 Hypertensive heart disease with heart failure: Secondary | ICD-10-CM | POA: Diagnosis not present

## 2024-01-25 DIAGNOSIS — G4733 Obstructive sleep apnea (adult) (pediatric): Secondary | ICD-10-CM | POA: Insufficient documentation

## 2024-01-25 DIAGNOSIS — Z86718 Personal history of other venous thrombosis and embolism: Secondary | ICD-10-CM | POA: Diagnosis not present

## 2024-01-25 DIAGNOSIS — Z79899 Other long term (current) drug therapy: Secondary | ICD-10-CM | POA: Insufficient documentation

## 2024-01-25 DIAGNOSIS — Z794 Long term (current) use of insulin: Secondary | ICD-10-CM | POA: Diagnosis not present

## 2024-01-25 DIAGNOSIS — Z95810 Presence of automatic (implantable) cardiac defibrillator: Secondary | ICD-10-CM

## 2024-01-25 DIAGNOSIS — I5023 Acute on chronic systolic (congestive) heart failure: Secondary | ICD-10-CM | POA: Diagnosis present

## 2024-01-25 DIAGNOSIS — Z556 Problems related to health literacy: Secondary | ICD-10-CM | POA: Insufficient documentation

## 2024-01-25 DIAGNOSIS — I10 Essential (primary) hypertension: Secondary | ICD-10-CM | POA: Diagnosis not present

## 2024-01-25 DIAGNOSIS — Z7902 Long term (current) use of antithrombotics/antiplatelets: Secondary | ICD-10-CM | POA: Insufficient documentation

## 2024-01-25 DIAGNOSIS — F17210 Nicotine dependence, cigarettes, uncomplicated: Secondary | ICD-10-CM | POA: Diagnosis not present

## 2024-01-25 DIAGNOSIS — F32A Depression, unspecified: Secondary | ICD-10-CM | POA: Diagnosis not present

## 2024-01-25 DIAGNOSIS — E1165 Type 2 diabetes mellitus with hyperglycemia: Secondary | ICD-10-CM | POA: Diagnosis not present

## 2024-01-25 DIAGNOSIS — I472 Ventricular tachycardia, unspecified: Secondary | ICD-10-CM | POA: Diagnosis not present

## 2024-01-25 DIAGNOSIS — Z8249 Family history of ischemic heart disease and other diseases of the circulatory system: Secondary | ICD-10-CM | POA: Diagnosis not present

## 2024-01-25 LAB — COMPREHENSIVE METABOLIC PANEL WITH GFR
ALT: 15 U/L (ref 0–44)
AST: 17 U/L (ref 15–41)
Albumin: 3.2 g/dL — ABNORMAL LOW (ref 3.5–5.0)
Alkaline Phosphatase: 61 U/L (ref 38–126)
Anion gap: 10 (ref 5–15)
BUN: 14 mg/dL (ref 6–20)
CO2: 25 mmol/L (ref 22–32)
Calcium: 9.4 mg/dL (ref 8.9–10.3)
Chloride: 99 mmol/L (ref 98–111)
Creatinine, Ser: 0.88 mg/dL (ref 0.61–1.24)
GFR, Estimated: >60 mL/min (ref >60)
Glucose, Bld: 475 mg/dL — ABNORMAL HIGH (ref 70–99)
Potassium: 4.2 mmol/L (ref 3.5–5.1)
Sodium: 134 mmol/L — ABNORMAL LOW (ref 135–145)
Total Bilirubin: 0.6 mg/dL (ref 0.0–1.2)
Total Protein: 7.1 g/dL (ref 6.5–8.1)

## 2024-01-25 LAB — BRAIN NATRIURETIC PEPTIDE: B Natriuretic Peptide: 1164.8 pg/mL — ABNORMAL HIGH (ref 0.0–100.0)

## 2024-01-25 MED ORDER — BUMETANIDE 1 MG PO TABS: ORAL_TABLET | ORAL | 3 refills | Status: DC

## 2024-01-25 MED ORDER — BUMETANIDE 1 MG PO TABS: 1.0000 mg | ORAL_TABLET | Freq: Every day | ORAL | 3 refills | Status: DC

## 2024-01-25 MED ORDER — FUROSEMIDE 80 MG PO TABS: 80.0000 mg | ORAL_TABLET | Freq: Every day | ORAL | Status: DC

## 2024-01-25 MED ORDER — POTASSIUM CHLORIDE CRYS ER 20 MEQ PO TBCR: 20.0000 meq | EXTENDED_RELEASE_TABLET | Freq: Every day | ORAL | 3 refills | Status: DC

## 2024-01-25 MED ORDER — SACUBITRIL-VALSARTAN 49-51 MG PO TABS: 1.0000 | ORAL_TABLET | Freq: Two times a day (BID) | ORAL | 3 refills | Status: DC

## 2024-01-25 MED ORDER — SACUBITRIL-VALSARTAN 24-26 MG PO TABS: 1.0000 | ORAL_TABLET | Freq: Two times a day (BID) | ORAL | Status: DC

## 2024-01-25 NOTE — Patient Instructions (Addendum)
 Medication Changes:  DO NOT MAKE ANY MEDICATION CHANGES TODAY   PLEASE RETURN AS SCHEDULED AND BRING ALL OF YOUR MEDICATIONS WITH YOU   Lab Work:  Labs done today, your results will be available in MyChart, we will contact you for abnormal readings.  Follow-Up in: AS SCHEDULED IN 1 WEEK   At the [facility], you and your health needs are our priority. We have a designated team specialized in the treatment of Heart Failure. This Care Team includes your primary Heart Failure Specialized Cardiologist (physician), Advanced Practice Providers (APPs- Physician Assistants and Nurse Practitioners), and Pharmacist who all work together to provide you with the care you need, when you need it.   You may see any of the following providers on your designated Care Team at your next follow up:  Dr. Toribio Fuel Dr. Ezra Shuck Dr. Ria Commander Dr. Odis Brownie Greig Mosses, NP Caffie Shed, [place] [facility] [address], [place] Beckey Coe, NP [place] Lee, NP Tinnie Redman, PharmD   Please be sure to bring in all your medications bottles to every appointment.   Need to Contact Us :  If you have any questions or concerns before your next appointment please send us  a message through [place] or call our office at [phone].    TO LEAVE A MESSAGE FOR THE NURSE SELECT OPTION 2, PLEASE LEAVE A MESSAGE INCLUDING: YOUR NAME DATE OF BIRTH CALL BACK NUMBER REASON FOR CALL**this is important as we prioritize the call backs  YOU WILL RECEIVE A CALL BACK THE SAME DAY AS LONG AS YOU CALL BEFORE 4:00 PM

## 2024-01-25 NOTE — Telephone Encounter (Signed)
 Transmission received.  One episode of VF that terminated without therapy.

## 2024-01-25 NOTE — Telephone Encounter (Signed)
 Will forward to provider

## 2024-01-25 NOTE — Telephone Encounter (Signed)
 Pt has been released to us . I have called pt to make sure a new monitor truly needs to be sent. Unable to leave msg will try again

## 2024-01-25 NOTE — Telephone Encounter (Signed)
 Requested pt in carelink he has not been followed by us , per pt he has not been to [facility] but that's where the request in carelink is from. Once released to us  will send to afib clinic to see if he has been shocked recently.

## 2024-01-25 NOTE — Progress Notes (Addendum)
 [facility]CP: [facility] Cardiologist: Dr. Anner HF Cardiologist: Dr. Rolan  Chief Complaint: CHF  HPI: Luis [name] is a 53 y.o. male with h/o chronic systolic heart failure, VT s/p ICD (MDT CRT-D), HTN, HLD, Type 2 DM, OSA on CPAP and h/o substance use (cocaine).   He has a reported h/o CAD/PCI in Care Everywhere though his cardiomyopathy has been classified as NICM. Unfortunately, I am unable to locate LHC/ angiographic details in Care Everywhere. Echos from 2021-2023 have shown LVEF ranging from 25-35%, RV mildly reduced.  Cardiomyopathy felt likely caused by cocaine use.    Admitted 9/23 for acute on chronic systolic CHF.  ICD was also noted to be at [facility]. Echo showed EF 25% w/ global HK, RV mildly reduced. He was diuresed w/ IV Lasix  and underwent RHC revealing normal filling pressures and reduced CO w/ CI of 1.82 L/min/m. He was placed on GDMT and transitioned to PO torsemide . Underwent gen change on 03/23/22 prior to d/c. D/c wt 296 lb. Referred to [facility] clinic.   Seen in [facility] on 04/14/22 w/ NYHA Class IIIb symptoms and fluid overload. Diuretics increased set up for RHC.    R/LHC (10/23) showed elevated filling pressures and low output. mRA 14, PAP 45/22, mPCWP 27, LVEDP 32, PA sat 60%, CI 1.77. LHC showed no significant CAD. He was directly admitted from cath lab for IV diuresis and initiation of inotropic support w/ milrinone . Milrinone  weaned and GDMT titrated. Found to have RUE DVT, PICC removed and placed on Eliquis . Felt not to be a LVAD/transplant candidate due to OHS/OSA, poor mobility and social situation. Discharged home, weight 283 lbs.  He has not been seen in HF clinic since 11/23. Patient was admitted earlier this month with acute respiratory failure requiring intubation in setting of acute on chronic CHF. He had been noncompliant with HF medications. Echo with LVEF less than 20. He was diuresed and GDMT restarted. Discharge weight 254 lb.  He is here today for  post-hospital CHF follow-up. The patient does not know what medications he is taking. He states he has an aid that helps him. She is present today and reports that it is not within her scope of practice to help manage his medications. He is short of breath with exertion, has orthopnea and lower extremity edema.  He was easily distracted and did not engage much in providing history. He was pacing around the room throughout the visit.   ROS: All systems negative except as listed in HPI, PMH and Problem List.  SH:  Social History   Socioeconomic History   Marital status: Widowed    Spouse name: Not on file   Number of children: 7   Years of education: Not on file   Highest education level: Bachelor's degree (e.g., BA, AB, BS)  Occupational History   Occupation: disability  Tobacco Use   Smoking status: Every Day    Current packs/day: 0.00    Average packs/day: 0.5 packs/day for 15.0 years (7.5 ttl pk-yrs)    Types: Cigarettes    Start date: 10/2006    Last attempt to quit: 10/2021    Years since quitting: 2.2   Smokeless tobacco: Never   Tobacco comments:    occasionally  Vaping Use   Vaping status: Never Used  Substance and Sexual Activity   Alcohol  use: Not Currently   Drug use: Yes    Types: Cocaine, Marijuana    Comment: using in binges, 1/4gm daily x 2 weeks.   Sexual activity:  Not Currently  Other Topics Concern   Not on file  Social History Narrative   Not on file   Social Drivers of Health   Financial Resource Strain: High Risk (11/16/2022)   Overall Financial Resource Strain (CARDIA)    Difficulty of Paying Living Expenses: Hard  Food Insecurity: No Food Insecurity (01/08/2024)   Hunger Vital Sign    Worried About Running Out of Food in the Last Year: Never true    Ran Out of Food in the Last Year: Never true  Transportation Needs: No Transportation Needs (01/08/2024)   PRAPARE - Administrator, Civil Service (Medical): No    Lack of Transportation  (Non-Medical): No  Physical Activity: Not on file  Stress: No Stress Concern Present (04/25/2023)   Received from [facility] - Occupational Stress Questionnaire    Feeling of Stress : Not at all  Social Connections: Unknown (08/26/2022)   Received from [facility]   Social Network    Social Network: Not on file  Intimate Partner Violence: Not At Risk (01/08/2024)   Humiliation, Afraid, Rape, and Kick questionnaire    Fear of Current or Ex-Partner: No    Emotionally Abused: No    Physically Abused: No    Sexually Abused: No    FH:  Family History  Problem Relation Age of Onset   Hypertension Mother    Bone cancer Father    Lung cancer Father     Past Medical History:  Diagnosis Date   Anxiety    Biventricular ICD (implantable cardioverter-defibrillator) in place    Chronic systolic CHF (congestive heart failure) (HCC)    Cocaine use    Diabetes mellitus without complication (HCC)    GERD (gastroesophageal reflux disease)    HLD (hyperlipidemia)    Hypertension    Morbid obesity (HCC)    NICM (nonischemic cardiomyopathy) (HCC)    OSA (obstructive sleep apnea)    PTSD (post-traumatic stress disorder)    on Depakote    Refusal of blood transfusions as patient is Jehovah's Witness    Tobacco abuse     Current Outpatient Medications  Medication Sig Dispense Refill   cholecalciferol (VITAMIN D3) 25 MCG (1000 UNIT) tablet Take 1,000 Units by mouth daily.     diclofenac  Sodium (VOLTAREN ) 1 % GEL Apply 1 Application topically 2 (two) times daily as needed (pain).     furosemide  (LASIX ) 80 MG tablet Take 1 tablet (80 mg total) by mouth daily. 30 tablet 0   gabapentin  (NEURONTIN ) 300 MG capsule Take 1 capsule (300 mg total) by mouth 2 (two) times daily. (Patient taking differently: Take 300 mg by mouth at bedtime.) 60 capsule 0   insulin  aspart (NOVOLOG  FLEXPEN) 100 UNIT/ML FlexPen Inject 15 Units into the skin 3 (three) times daily  with meals. (Patient taking differently: Inject 20 Units into the skin 3 (three) times daily with meals.) 15 mL 11   insulin  glargine (LANTUS ) 100 UNIT/ML Solostar Pen Inject 38 Units into the skin 2 (two) times daily. 15 mL 11   Insulin  Pen Needle 32G X 4 MM MISC Use as directed up to four times daily 100 each 0   loratadine  (CLARITIN ) 10 MG tablet Take 10 mg by mouth daily.     metoprolol  succinate (TOPROL -XL) 25 MG 24 hr tablet Take 1 tablet (25 mg total) by mouth 2 (two) times daily with a meal. 60 tablet 0   pyridoxine  (B-6) 100 MG tablet Take 1  tablet (100 mg total) by mouth daily. 30 tablet 0   rosuvastatin  (CRESTOR ) 10 MG tablet Take 1 tablet (10 mg total) by mouth daily. 30 tablet 0   sacubitril -valsartan  (ENTRESTO ) 24-26 MG Take 1 tablet by mouth 2 (two) times daily. 60 tablet 0   spironolactone  (ALDACTONE ) 25 MG tablet Take 1 tablet (25 mg total) by mouth daily. 30 tablet 0   thiamine  (VITAMIN B1) 100 MG tablet Take 1 tablet (100 mg total) by mouth daily. 30 tablet 0   No current facility-administered medications for this encounter.    Vitals:   01/25/24 0946  BP: (!) 152/82  Pulse: 96  SpO2: 94%  Weight: 112.9 kg (249 lb)  Height: 5' 5 (1.651 m)    PHYSICAL EXAM:  General:  Chronically ill appearing Cor: JVP to midneck. Regular rate & rhythm. No murmurs. Lungs: clear Abdomen: obese, nondistended Extremities: 2+ bilateral lower extremity edema Neuro: alert & orientedx3. Affect flat.  Quick Look [employer] Interrogation: 1 treated VT/VF episode logged (details not available), no AT/AF, 2.5 hrs activity daily, optivol fluid accumulation since June 24th  ASSESSMENT & PLAN:  1. Acute on chronic systolic CHF:  Nonischemic cardiomyopathy.  MDT CRT-D device.  Echoes 2021-2023, LVEF range 25-35%, RV mildly reduced.  Reportedly diagnosed with nonischemic cardiomyopathy in 2012 in [place] , etiology felt due to cocaine abuse.  [facility] cardiology 03/07/2019 notes mention patient has  CAD with history of angioplasty in the past. Unable to locate cath reports/ details regarding coronary anatomy in record. Most recent echo in 2/23 showed EF 25%, mild LV dilation, mildly decreased RV function. RHC in 9/23 showed low CI at 1.82. RHC (10/23) showed elevated filling pressures and low output, mRA 14, PAP 45/22, mPCWP 27, LVEDP 32, PA sat 60%, CI 1.77. LHC showed no significant coronary disease. Echo 7/25: EF < 20%, RV okay - Recent admit for acute on chronic CHF in setting of noncompliance with medications - NYHA III. Volume overloaded. He is not compliant with medications and does not know what he is taking. He is prescribed 80 mg lasix  daily. His aid is present and does not know if current med list reflects what he has at home. Recommended he return to clinic this week with his pill bottles to review current meds or start over with new regimen from clinic today. He did not want to stay in the clinic any longer today and states he cannot come back this week. He is going on vacation. - Continue spiro 25 mg daily, Entresto  24/26 mg BID, metoprolol  xl 25 mg BID - unsure if he is taking - Restart Farxiga  once A1c improved, recently 14 - CMET/BNP today - Suspect that with noncompliance, OHS/OSA, obesity, and his poor mobility that he will be a poor candidate for LVAD/transplant.  2. CAD: Documented in Care Everywhere, reported h/o angioplasty, details unknown (cannot find a cath report though prior CAD is referenced). LHC showed no significant CAD. No chest pain. - Had been on plavix  d/t reported aspirin allergy. Stopped for unclear reasons. Add back at f/u once we can get him on a stable regimen. - Continue Crestor   3. H/o VT: Has CRT-D w/ chronic systolic heart failure and QRS > 150 ms  - Gen change in 2023 - He had established with EP at [facility]. His device has not been followed. Not set up for remote monitoring. He has no showed multiple visits. Quick look interrogation today suggests VF  episode earlier this month, no other details available. He  refused to stay in clinic for rep to interrogate his device.  - Has an appointment to reestablish with [facility] EP later this month. 4. HTN: BP elevated - Suspect he is not taking meds correctly. See above 5. Type 2DM: Uncontrolled, recent A1c 14. - He is on insulin . 6. Cocaine: Says he has quit for months now.    7. Depression: Depressed due to lack of social/family support and food insecurity  - Continue Cymbalta .  8. Jehovah's Witness.  9. OHS/OSA: He uses oxygen during the day and CPAP at night.  Doubt adherence. 10. RUE DVT: Provoked.  - Now off Eliquis  11. SDOH: He has poor health literacy. Unfortunately not a candidate for Paramedicine.  -He will be at very high risk for readmission  Follow-up: 1 week with APP to review medications/titrate GDMT. Instructed to bring all medications to appointment. Declines sooner follow-up.

## 2024-01-25 NOTE — Telephone Encounter (Signed)
 Noted. Needs to reestablish with EP.

## 2024-01-25 NOTE — Progress Notes (Signed)
 Unable to interrogate patients device, unable to pull up any recent device checks. He states that he does not follow with [facility] and never has for device checks. Multiple no shows to appointments. Patient denies having monitor at home for device. Patient is a poor historian and is unsure overall about his device care.   Spoke with device clinic who is going to get patient established here for device care. Device clinic offered to come and interrogate device to check and patient declined this and states that he doesn't have time for this today.   Device clinic will continue to get patient established with clinic as patient wishes to follow with [facility].

## 2024-01-27 NOTE — Telephone Encounter (Signed)
 Order placed for EP

## 2024-01-31 ENCOUNTER — Telehealth (HOSPITAL_COMMUNITY): Payer: Self-pay

## 2024-01-31 ENCOUNTER — Emergency Department (HOSPITAL_BASED_OUTPATIENT_CLINIC_OR_DEPARTMENT_OTHER): Admission: EM | Admit: 2024-01-31 | Discharge: 2024-01-31 | Discharge status: Against Medical Advice

## 2024-01-31 ENCOUNTER — Other Ambulatory Visit: Payer: Self-pay

## 2024-01-31 NOTE — Progress Notes (Signed)
 ADVANCED HF CLINIC NOTE PCP: [facility] Cardiologist: Dr. Anner HF Cardiologist: Dr. Rolan Chief Complaint: Heart Failure Follow-up  HPI: Luis [name] is a 53 y.o. male with h/o chronic systolic heart failure, VT s/p ICD (MDT CRT-D), HTN, HLD, Type 2 DM, OSA on CPAP and h/o substance use (cocaine).   He has a reported h/o CAD/PCI in Care Everywhere though his cardiomyopathy has been classified as NICM. Unfortunately, I am unable to locate LHC/ angiographic details in Care Everywhere. Echos from 2021-2023 have shown LVEF ranging from 25-35%, RV mildly reduced.  Cardiomyopathy felt likely caused by cocaine use.    Admitted 9/23 for acute on chronic systolic CHF.  ICD was also noted to be at [facility]. Echo showed EF 25% w/ global HK, RV mildly reduced. He was diuresed w/ IV Lasix  and underwent RHC revealing normal filling pressures and reduced CO w/ CI of 1.82 L/min/m. He was placed on GDMT and transitioned to PO torsemide . Underwent gen change on 03/23/22 prior to d/c. D/c wt 296 lb. Referred to [facility] clinic.   Seen in [facility] on 04/14/22 w/ NYHA Class IIIb symptoms and fluid overload. Diuretics increased set up for RHC.    R/LHC (10/23) showed elevated filling pressures and low output. mRA 14, PAP 45/22, mPCWP 27, LVEDP 32, PA sat 60%, CI 1.77. LHC showed no significant CAD. He was directly admitted from cath lab for IV diuresis and initiation of inotropic support w/ milrinone . Milrinone  weaned and GDMT titrated. Found to have RUE DVT, PICC removed and placed on Eliquis . Felt not to be a LVAD/transplant candidate due to OHS/OSA, poor mobility and social situation. Discharged home, weight 283 lbs.  He has not been seen in HF clinic since 11/23. Patient was admitted earlier this month with acute respiratory failure requiring intubation in setting of acute on chronic CHF. He had been noncompliant with HF medications. Echo with LVEF less than 20. He was diuresed and GDMT restarted. Discharge  weight 254 lb.  He is here today for post-hospital CHF follow-up. The patient does not know what medications he is taking. He states he has an aid that helps him. She is present today and reports that it is not within her scope of practice to help manage his medications. He is short of breath with exertion, has orthopnea and lower extremity edema.  He was easily distracted and did not engage much in providing history. He was pacing around the room throughout the visit.    He returns today for heart failure follow up. Overall feeling ***. NYHA ***. Reports {Symptoms; cardiac:12860::dyspnea,fatigue}. Denies {Symptoms; cardiac:12860::chest pain,dyspnea,fatigue,near-syncope,orthopnea,palpitations,dizziness,abnormal bleeding}. Able to perform ADLs. Appetite okay. Weight at home ***. BP at home***. Compliant with all medications.    ROS: All systems negative except as listed in HPI, PMH and Problem List.  SH:  Social History   Socioeconomic History   Marital status: Widowed    Spouse name: Not on file   Number of children: 7   Years of education: Not on file   Highest education level: Bachelor's degree (e.g., BA, AB, BS)  Occupational History   Occupation: disability  Tobacco Use   Smoking status: Every Day    Current packs/day: 0.00    Average packs/day: 0.5 packs/day for 15.0 years (7.5 ttl pk-yrs)    Types: Cigarettes    Start date: 10/2006    Last attempt to quit: 10/2021    Years since quitting: 2.2   Smokeless tobacco: Never   Tobacco comments:  occasionally  Vaping Use   Vaping status: Never Used  Substance and Sexual Activity   Alcohol  use: Not Currently   Drug use: Yes    Types: Cocaine, Marijuana    Comment: using in binges, 1/4gm daily x 2 weeks.   Sexual activity: Not Currently  Other Topics Concern   Not on file  Social History Narrative   Not on file   Social Drivers of Health   Financial Resource Strain: High Risk (11/16/2022)   Overall  Financial Resource Strain (CARDIA)    Difficulty of Paying Living Expenses: Hard  Food Insecurity: No Food Insecurity (01/08/2024)   Hunger Vital Sign    Worried About Running Out of Food in the Last Year: Never true    Ran Out of Food in the Last Year: Never true  Transportation Needs: No Transportation Needs (01/08/2024)   PRAPARE - Administrator, Civil Service (Medical): No    Lack of Transportation (Non-Medical): No  Physical Activity: Not on file  Stress: No Stress Concern Present (04/25/2023)   Received from [facility] of Occupational Health - Occupational Stress Questionnaire    Feeling of Stress : Not at all  Social Connections: Unknown (08/26/2022)   Received from [facility]   Social Network    Social Network: Not on file  Intimate Partner Violence: Not At Risk (01/08/2024)   Humiliation, Afraid, Rape, and Kick questionnaire    Fear of Current or Ex-Partner: No    Emotionally Abused: No    Physically Abused: No    Sexually Abused: No    FH:  Family History  Problem Relation Age of Onset   Hypertension Mother    Bone cancer Father    Lung cancer Father     Past Medical History:  Diagnosis Date   Anxiety    Biventricular ICD (implantable cardioverter-defibrillator) in place    Chronic systolic CHF (congestive heart failure) (HCC)    Cocaine use    Diabetes mellitus without complication (HCC)    GERD (gastroesophageal reflux disease)    HLD (hyperlipidemia)    Hypertension    Morbid obesity (HCC)    NICM (nonischemic cardiomyopathy) (HCC)    OSA (obstructive sleep apnea)    PTSD (post-traumatic stress disorder)    on Depakote    Refusal of blood transfusions as patient is Jehovah's Witness    Tobacco abuse     Current Outpatient Medications  Medication Sig Dispense Refill   cholecalciferol (VITAMIN D3) 25 MCG (1000 UNIT) tablet Take 1,000 Units by mouth daily.     diclofenac  Sodium (VOLTAREN ) 1 % GEL Apply 1 Application  topically 2 (two) times daily as needed (pain).     furosemide  (LASIX ) 80 MG tablet Take 1 tablet (80 mg total) by mouth daily.     gabapentin  (NEURONTIN ) 300 MG capsule Take 1 capsule (300 mg total) by mouth 2 (two) times daily. (Patient taking differently: Take 300 mg by mouth at bedtime.) 60 capsule 0   insulin  aspart (NOVOLOG  FLEXPEN) 100 UNIT/ML FlexPen Inject 15 Units into the skin 3 (three) times daily with meals. (Patient taking differently: Inject 20 Units into the skin 3 (three) times daily with meals.) 15 mL 11   insulin  glargine (LANTUS ) 100 UNIT/ML Solostar Pen Inject 38 Units into the skin 2 (two) times daily. 15 mL 11   Insulin  Pen Needle 32G X 4 MM MISC Use as directed up to four times daily 100 each 0   loratadine  (CLARITIN ) 10  MG tablet Take 10 mg by mouth daily.     metoprolol  succinate (TOPROL -XL) 25 MG 24 hr tablet Take 1 tablet (25 mg total) by mouth 2 (two) times daily with a meal. 60 tablet 0   pyridoxine  (B-6) 100 MG tablet Take 1 tablet (100 mg total) by mouth daily. 30 tablet 0   rosuvastatin  (CRESTOR ) 10 MG tablet Take 1 tablet (10 mg total) by mouth daily. 30 tablet 0   sacubitril -valsartan  (ENTRESTO ) 24-26 MG Take 1 tablet by mouth 2 (two) times daily.     spironolactone  (ALDACTONE ) 25 MG tablet Take 1 tablet (25 mg total) by mouth daily. 30 tablet 0   thiamine  (VITAMIN B1) 100 MG tablet Take 1 tablet (100 mg total) by mouth daily. 30 tablet 0   No current facility-administered medications for this visit.   There were no vitals filed for this visit.  There were no vitals filed for this visit.  PHYSICAL EXAM: General: Well appearing. No distress on RA Cardiac: JVP ***. S1 and S2 present. No murmurs or rub. Resp: Lung sounds clear and equal B/L Abdomen: Soft, non-tender, non-distended.  Extremities: Warm and dry.  *** edema.  Neuro: Alert and oriented x3. Affect pleasant. Moves all extremities without difficulty.  Quick Look [employer] Interrogation: 1 treated  VT/VF episode logged (details not available), no AT/AF, 2.5 hrs activity daily, optivol fluid accumulation since June 24th   ASSESSMENT & PLAN:  1. Acute on chronic systolic CHF:  Nonischemic cardiomyopathy.  MDT CRT-D device.  Echoes 2021-2023, LVEF range 25-35%, RV mildly reduced.  Reportedly diagnosed with nonischemic cardiomyopathy in 2012 in [place] , etiology felt due to cocaine abuse.  [facility] cardiology 03/07/2019 notes mention patient has CAD with history of angioplasty in the past. Unable to locate cath reports/ details regarding coronary anatomy in record. Most recent echo in 2/23 showed EF 25%, mild LV dilation, mildly decreased RV function. RHC in 9/23 showed low CI at 1.82. RHC (10/23) showed elevated filling pressures and low output, mRA 14, PAP 45/22, mPCWP 27, LVEDP 32, PA sat 60%, CI 1.77. LHC showed no significant coronary disease. Echo 7/25: EF < 20%, RV okay - Recent admit for acute on chronic CHF in setting of noncompliance with medications - NYHA III. Volume overloaded. He is not compliant with medications and does not know what he is taking. He is prescribed 80 mg lasix  daily. His aid is present and does not know if current med list reflects what he has at home. Recommended he return to clinic this week with his pill bottles to review current meds or start over with new regimen from clinic today. He did not want to stay in the clinic any longer today and states he cannot come back this week. He is going on vacation. - Continue spiro 25 mg daily, Entresto  24/26 mg BID, metoprolol  xl 25 mg BID - unsure if he is taking - Restart Farxiga  once A1c improved, recently 14 - CMET/BNP today - Suspect that with noncompliance, OHS/OSA, obesity, and his poor mobility that he will be a poor candidate for LVAD/transplant.  2. CAD: Documented in Care Everywhere, reported h/o angioplasty, details unknown (cannot find a cath report though prior CAD is referenced). LHC showed no significant CAD. No  chest pain. - Had been on plavix  d/t reported aspirin allergy. Stopped for unclear reasons. Add back at f/u once we can get him on a stable regimen. - Continue Crestor   3. H/o VT: Has CRT-D w/ chronic systolic heart failure and  QRS > 150 ms  - Gen change in 2023 - He had established with EP at [facility]. His device has not been followed. Not set up for remote monitoring. He has no showed multiple visits. Quick look interrogation today suggests VF episode earlier this month, no other details available. He refused to stay in clinic for rep to interrogate his device.  - Has an appointment to reestablish with [facility] EP later this month. 4. HTN: BP elevated - Suspect he is not taking meds correctly. See above 5. Type 2DM: Uncontrolled, recent A1c 14. - He is on insulin . 6. Cocaine: Says he has quit for months now.    7. Depression: Depressed due to lack of social/family support and food insecurity  - Continue Cymbalta .  8. Jehovah's Witness.  9. OHS/OSA: He uses oxygen during the day and CPAP at night.  Doubt adherence. 10. RUE DVT: Provoked.  - Now off Eliquis  11. SDOH: He has poor health literacy. Unfortunately not a candidate for Paramedicine.  -He will be at very high risk for readmission  Follow-up in *** with ***  [place] Oneika Simonian, NP 01/31/24

## 2024-01-31 NOTE — Telephone Encounter (Signed)
 Called to confirm/remind patient of their appointment at the [facility] on 02/01/24.   Appointment:   [x] Confirmed  [] Left mess   [] No answer/No voice mail  [] VM Full/unable to leave message  [] Phone not in service  Patient reminded to bring all medications and/or complete list.  Confirmed patient has transportation. Gave directions, instructed to utilize valet parking.

## 2024-02-01 ENCOUNTER — Encounter (HOSPITAL_COMMUNITY): Payer: Self-pay

## 2024-02-01 ENCOUNTER — Emergency Department (HOSPITAL_COMMUNITY)

## 2024-02-01 ENCOUNTER — Emergency Department (HOSPITAL_COMMUNITY)
Admission: EM | Admit: 2024-02-01 | Discharge: 2024-02-01 | Disposition: A | Discharge status: Home / Self Care | Attending: Emergency Medicine | Admitting: Emergency Medicine

## 2024-02-01 ENCOUNTER — Other Ambulatory Visit: Payer: Self-pay

## 2024-02-01 ENCOUNTER — Ambulatory Visit (HOSPITAL_COMMUNITY)
Admission: RE | Admit: 2024-02-01 | Discharge: 2024-02-01 | Disposition: A | Discharge status: Home / Self Care | Source: Ambulatory Visit | Attending: Cardiology

## 2024-02-01 VITALS — BP 164/82 | HR 92 | Ht 65.0 in | Wt 254.0 lb

## 2024-02-01 DIAGNOSIS — I110 Hypertensive heart disease with heart failure: Secondary | ICD-10-CM | POA: Insufficient documentation

## 2024-02-01 DIAGNOSIS — E119 Type 2 diabetes mellitus without complications: Secondary | ICD-10-CM | POA: Diagnosis not present

## 2024-02-01 DIAGNOSIS — Z556 Problems related to health literacy: Secondary | ICD-10-CM | POA: Insufficient documentation

## 2024-02-01 DIAGNOSIS — Z79899 Other long term (current) drug therapy: Secondary | ICD-10-CM | POA: Insufficient documentation

## 2024-02-01 DIAGNOSIS — Z91010 Allergy to peanuts: Secondary | ICD-10-CM | POA: Diagnosis not present

## 2024-02-01 DIAGNOSIS — M79661 Pain in right lower leg: Secondary | ICD-10-CM

## 2024-02-01 DIAGNOSIS — Z955 Presence of coronary angioplasty implant and graft: Secondary | ICD-10-CM | POA: Diagnosis not present

## 2024-02-01 DIAGNOSIS — M79671 Pain in right foot: Secondary | ICD-10-CM | POA: Insufficient documentation

## 2024-02-01 DIAGNOSIS — Z86718 Personal history of other venous thrombosis and embolism: Secondary | ICD-10-CM | POA: Diagnosis not present

## 2024-02-01 DIAGNOSIS — I10 Essential (primary) hypertension: Secondary | ICD-10-CM

## 2024-02-01 DIAGNOSIS — F17210 Nicotine dependence, cigarettes, uncomplicated: Secondary | ICD-10-CM | POA: Insufficient documentation

## 2024-02-01 DIAGNOSIS — F32A Depression, unspecified: Secondary | ICD-10-CM | POA: Diagnosis not present

## 2024-02-01 DIAGNOSIS — I2510 Atherosclerotic heart disease of native coronary artery without angina pectoris: Secondary | ICD-10-CM | POA: Diagnosis not present

## 2024-02-01 DIAGNOSIS — F1990 Other psychoactive substance use, unspecified, uncomplicated: Secondary | ICD-10-CM

## 2024-02-01 DIAGNOSIS — Z794 Long term (current) use of insulin: Secondary | ICD-10-CM | POA: Insufficient documentation

## 2024-02-01 DIAGNOSIS — E1165 Type 2 diabetes mellitus with hyperglycemia: Secondary | ICD-10-CM | POA: Diagnosis not present

## 2024-02-01 DIAGNOSIS — Z87898 Personal history of other specified conditions: Secondary | ICD-10-CM

## 2024-02-01 DIAGNOSIS — Z7902 Long term (current) use of antithrombotics/antiplatelets: Secondary | ICD-10-CM | POA: Diagnosis not present

## 2024-02-01 DIAGNOSIS — I428 Other cardiomyopathies: Secondary | ICD-10-CM | POA: Diagnosis not present

## 2024-02-01 DIAGNOSIS — I5022 Chronic systolic (congestive) heart failure: Secondary | ICD-10-CM | POA: Diagnosis present

## 2024-02-01 DIAGNOSIS — M79672 Pain in left foot: Secondary | ICD-10-CM | POA: Diagnosis not present

## 2024-02-01 DIAGNOSIS — I25112 Atherosclerotic heart disease of native coronary artery with refractory angina pectoris: Secondary | ICD-10-CM

## 2024-02-01 DIAGNOSIS — Z91040 Latex allergy status: Secondary | ICD-10-CM | POA: Insufficient documentation

## 2024-02-01 DIAGNOSIS — Z95810 Presence of automatic (implantable) cardiac defibrillator: Secondary | ICD-10-CM | POA: Insufficient documentation

## 2024-02-01 DIAGNOSIS — D72829 Elevated white blood cell count, unspecified: Secondary | ICD-10-CM | POA: Diagnosis not present

## 2024-02-01 DIAGNOSIS — R600 Localized edema: Secondary | ICD-10-CM | POA: Insufficient documentation

## 2024-02-01 DIAGNOSIS — G4733 Obstructive sleep apnea (adult) (pediatric): Secondary | ICD-10-CM | POA: Diagnosis not present

## 2024-02-01 LAB — RAPID URINE DRUG SCREEN, HOSP PERFORMED
Amphetamines: NOT DETECTED
Barbiturates: NOT DETECTED
Benzodiazepines: NOT DETECTED
Cocaine: POSITIVE — AB
Opiates: NOT DETECTED
Tetrahydrocannabinol: NOT DETECTED

## 2024-02-01 LAB — CBC
HCT: 42.9 % (ref 39.0–52.0)
Hemoglobin: 13.7 g/dL (ref 13.0–17.0)
MCH: 28.4 pg (ref 26.0–34.0)
MCHC: 31.9 g/dL (ref 30.0–36.0)
MCV: 88.8 fL (ref 80.0–100.0)
Platelets: 194 K/uL (ref 150–400)
RBC: 4.83 MIL/uL (ref 4.22–5.81)
RDW: 12.6 % (ref 11.5–15.5)
WBC: 11.3 K/uL — ABNORMAL HIGH (ref 4.0–10.5)
nRBC: 0 % (ref 0.0–0.2)

## 2024-02-01 LAB — BASIC METABOLIC PANEL WITH GFR
Anion gap: 11 (ref 5–15)
BUN: 15 mg/dL (ref 6–20)
CO2: 24 mmol/L (ref 22–32)
Calcium: 8.6 mg/dL — ABNORMAL LOW (ref 8.9–10.3)
Chloride: 99 mmol/L (ref 98–111)
Creatinine, Ser: 0.88 mg/dL (ref 0.61–1.24)
GFR, Estimated: >60 mL/min (ref >60)
Glucose, Bld: 362 mg/dL — ABNORMAL HIGH (ref 70–99)
Potassium: 4.1 mmol/L (ref 3.5–5.1)
Sodium: 134 mmol/L — ABNORMAL LOW (ref 135–145)

## 2024-02-01 LAB — TROPONIN I (HIGH SENSITIVITY): Troponin I (High Sensitivity): 23 ng/L — ABNORMAL HIGH (ref <18)

## 2024-02-01 LAB — BRAIN NATRIURETIC PEPTIDE: B Natriuretic Peptide: 466.5 pg/mL — ABNORMAL HIGH (ref 0.0–100.0)

## 2024-02-01 LAB — AMMONIA: Ammonia: 30 umol/L (ref 9–35)

## 2024-02-01 LAB — ETHANOL: Alcohol, Ethyl (B): <15 mg/dL (ref <15)

## 2024-02-01 MED ORDER — HYDROCODONE-ACETAMINOPHEN 5-325 MG PO TABS
1.0000 | ORAL_TABLET | Freq: Once | ORAL | Status: AC
  Administered 2024-02-01: 1 via ORAL
  Filled 2024-02-01: qty 1

## 2024-02-01 NOTE — Progress Notes (Addendum)
 Requesting provider: Warren Shad PA-C, in ED  Patient was sent in from AHF clinic due to the hospital due to concerns for abnormal behavior and possible intoxication.  Cardiology team reached out to AHF clinic and we were notified that he was stable from cardiac standpoint but needed noncardiac evaluation due to concern for intoxication.    I spoke to APP in the ED and requested to address the acute needs for the patient and to consult cardiology if need arises.   No charge.   Luis Hope Stilwell, DO, FACC

## 2024-02-01 NOTE — ED Provider Notes (Signed)
 [place] EMERGENCY DEPARTMENT AT [facility] Provider Note   CSN: [id number] Arrival date & time: 02/01/24  1017     Patient presents with: Foot Swelling   Luis [name] is a 53 y.o. male.  With past medical history of chronic systolic heart failure, hypertension, hyperlipidemia, diabetes, obstructive sleep apnea, history of substance abuse presents to emergency room from cardiologist office.  He is here with caregiver, he will not cooperate with the exam keeps moving unable to sit still.  Will not verbalize where he is having pain but kicks his feet.  He does not endorse any chest pain or shortness of breath.  He reports he has been taking his medications as prescribed.  Patient denies injury trauma or fall.   HPI     Prior to Admission medications   Medication Sig Start Date End Date Taking? Authorizing Provider  cholecalciferol (VITAMIN D3) 25 MCG (1000 UNIT) tablet Take 1,000 Units by mouth daily.    [provider]  clopidogrel  (PLAVIX ) 75 MG tablet Take 75 mg by mouth daily.    [provider]  diclofenac  Sodium (VOLTAREN ) 1 % GEL Apply 1 Application topically 2 (two) times daily as needed (pain). 10/20/22   [provider]  digoxin  (LANOXIN ) 0.125 MG tablet Take 1 tablet by mouth daily.    [provider]  DULoxetine  (CYMBALTA ) 30 MG capsule Take by mouth.    [provider]  furosemide  (LASIX ) 80 MG tablet Take 1 tablet (80 mg total) by mouth daily. 01/25/24   Colletta Manuelita Garre, PA-C  gabapentin  (NEURONTIN ) 300 MG capsule Take 1 capsule (300 mg total) by mouth 2 (two) times daily. 06/28/23   Angiulli, Toribio PARAS, PA-C  insulin  aspart (NOVOLOG  FLEXPEN) 100 UNIT/ML FlexPen Inject 15 Units into the skin 3 (three) times daily with meals. 06/28/23   Angiulli, Toribio PARAS, PA-C  insulin  glargine (LANTUS ) 100 UNIT/ML Solostar Pen Inject 38 Units into the skin 2 (two) times daily. 06/28/23   Angiulli, Toribio PARAS, PA-C  Insulin   Pen Needle 32G X 4 MM MISC Use as directed up to four times daily 06/28/23   Angiulli, Toribio PARAS, PA-C  isoniazid  (NYDRAZID ) 300 MG tablet Take by mouth daily.    [provider]  loratadine  (CLARITIN ) 10 MG tablet Take 10 mg by mouth daily.    [provider]  metoprolol  succinate (TOPROL -XL) 25 MG 24 hr tablet Take 1 tablet (25 mg total) by mouth 2 (two) times daily with a meal. 01/10/24   [name], MD  Potassium Chloride  ER 20 MEQ TBCR Take 1 tablet by mouth daily.    [provider]  prazosin  (MINIPRESS ) 2 MG capsule Take 2 mg by mouth.    [provider]  pyridoxine  (B-6) 100 MG tablet Take 1 tablet (100 mg total) by mouth daily. 06/28/23   Angiulli, Toribio PARAS, PA-C  rosuvastatin  (CRESTOR ) 10 MG tablet Take 1 tablet (10 mg total) by mouth daily. 01/11/24   [name], MD  sacubitril -valsartan  (ENTRESTO ) 24-26 MG Take 1 tablet by mouth 2 (two) times daily. 01/25/24   Colletta Manuelita Garre, PA-C  spironolactone  (ALDACTONE ) 25 MG tablet Take 1 tablet (25 mg total) by mouth daily. 01/10/24   [name], MD  thiamine  (VITAMIN B1) 100 MG tablet Take 1 tablet (100 mg total) by mouth daily. 01/07/24   Adele Song, MD    Allergies: Aspirin, Iodine-131, Peanut (diagnostic), Iodinated contrast media, Latex, Penicillins, Ultram  [tramadol ], Zestril [lisinopril], Cinnamon, Amoxicillin-pot  clavulanate, and Lidocaine     Review of Systems  Musculoskeletal:  Positive for joint swelling.    Updated Vital Signs BP (!) 152/100   Pulse 85   Temp 98.2 F (36.8 C) (Oral)   Resp (!) 21   Ht 5' 5.5 (1.664 m)   Wt 115.2 kg   SpO2 97%   BMI 41.62 kg/m   Physical Exam Vitals and nursing note reviewed.  Constitutional:      General: He is not in acute distress.    Appearance: He is not toxic-appearing.  HENT:     Head: Normocephalic and atraumatic.  Eyes:     General: No scleral icterus.    Conjunctiva/sclera: Conjunctivae normal.  Cardiovascular:     Rate  and Rhythm: Normal rate and regular rhythm.     Pulses: Normal pulses.     Heart sounds: Normal heart sounds.  Pulmonary:     Effort: Pulmonary effort is normal. No respiratory distress.     Breath sounds: Normal breath sounds.  Abdominal:     General: Abdomen is flat. Bowel sounds are normal.     Palpations: Abdomen is soft.     Tenderness: There is no abdominal tenderness.  Musculoskeletal:     Right lower leg: Edema present.     Left lower leg: Edema present.  Skin:    General: Skin is warm and dry.     Findings: No lesion.  Neurological:     General: No focal deficit present.     Mental Status: He is alert and oriented to person, place, and time. Mental status is at baseline.     (all labs ordered are listed, but only abnormal results are displayed) Labs Reviewed  BASIC METABOLIC PANEL WITH GFR - Abnormal; Notable for the following components:      Result Value   Sodium 134 (*)    Glucose, Bld 362 (*)    Calcium  8.6 (*)    All other components within normal limits  CBC - Abnormal; Notable for the following components:   WBC 11.3 (*)    All other components within normal limits  BRAIN NATRIURETIC PEPTIDE - Abnormal; Notable for the following components:   B Natriuretic Peptide 466.5 (*)    All other components within normal limits  TROPONIN I (HIGH SENSITIVITY) - Abnormal; Notable for the following components:   Troponin I (High Sensitivity) 23 (*)    All other components within normal limits  AMMONIA  RAPID URINE DRUG SCREEN, HOSP PERFORMED  ETHANOL  TROPONIN I (HIGH SENSITIVITY)    EKG: None  Radiology: DG Foot Complete Right Result Date: 02/01/2024 EXAM: [id number] CHL (XR FOOT 3 OR MORE VIEWS), [id number] CHL (XR FOOT 3 OR MORE VIEWS LEFT) COMPARISON: None available. CLINICAL HISTORY: Heel pain, swelling. bilateral foot swelling and pain, left more than right, for past week. FINDINGS: BONES AND JOINTS: No acute fracture. No focal osseous lesion. No joint  dislocation. Right-sided anterior talar beaking. SOFT TISSUES: Crescentic calcific density projects over the lateral aspect of the fourth toe metatarsophalangeal joint in the left foot. Mild diffuse soft tissue swelling. IMPRESSION: 1. Mild diffuse soft tissue swelling, likely related to the reported bilateral foot swelling and pain. 2. 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 3. Crescentic calcific density projecting over the lateral left fourth toe metatarsophalangeal joint may represent a foreign body or be related to sequela of remote injury. Electronically signed by: Manford Cummins MD 02/01/2024 02:07 PM EDT RP Workstation:  [id number]   DG Foot Complete Left Result Date: 02/01/2024 EXAM: [id number] CHL (XR FOOT 3 OR MORE VIEWS), [phone] CHL (XR FOOT 3 OR MORE VIEWS LEFT) COMPARISON: None available. CLINICAL HISTORY: Heel pain, swelling. bilateral foot swelling and pain, left more than right, for past week. FINDINGS: BONES AND JOINTS: No acute fracture. No focal osseous lesion. No joint dislocation. Right-sided anterior talar beaking. SOFT TISSUES: Crescentic calcific density projects over the lateral aspect of the fourth toe metatarsophalangeal joint in the left foot. Mild diffuse soft tissue swelling. IMPRESSION: 1. Mild diffuse soft tissue swelling, likely related to the reported bilateral foot swelling and pain. 2. 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 3. Crescentic calcific density projecting over the lateral left fourth toe metatarsophalangeal joint may represent a foreign body or be related to sequela of remote injury. Electronically signed by: Manford Cummins MD 02/01/2024 02:07 PM EDT RP Workstation: [id number]   DG Chest 2 View Result Date: 02/01/2024 CLINICAL DATA:  Shortness of breath.  Congestive heart failure. EXAM: CHEST - 2 VIEW COMPARISON:  Radiographs  01/06/2024 and 01/05/2024.  CT 12/07/2022. FINDINGS: Left subclavian AICD leads appear unchanged, projecting over the right atrium, right ventricle and coronary sinus. Stable cardiomegaly. Previously demonstrated vascular congestion has resolved. There is no edema, confluent airspace disease, pleural effusion or pneumothorax. Stable mild degenerative changes in the spine without acute osseous abnormality. IMPRESSION: Interval resolution of previously demonstrated vascular congestion. No acute cardiopulmonary process. Stable cardiomegaly. Electronically Signed   By: Elsie Perone M.D.   On: 02/01/2024 12:17     Procedures   Medications Ordered in the ED - No data to display  Clinical Course as of 02/01/24 1525  Tue Feb 01, 2024  1403 Refusing DVT study.  [JB]    Clinical Course User Index [JB] Celestial Barnfield, Warren SAILOR, PA-C                                 Medical Decision Making Amount and/or Complexity of Data Reviewed Labs: ordered. Radiology: ordered.  Risk Prescription drug management.   This patient presents to the ED for concern of foot pain, this involves an extensive number of treatment options, and is a complaint that carries with it a high risk of complications and morbidity.  The differential diagnosis includes DVT, cellulitis, fracture, gout,, osteomyelitis   Co morbidities that complicate the patient evaluation  CHF, cocaine use, hypertension, hyperlipidemia   Additional history obtained:  Additional history obtained from cardiologist office visit today sent here for further evaluation of specifically foot pain   Lab Tests:  I personally interpreted labs.  The pertinent results include:   CBC with mild leukocytosis.  No significant anemia.  Glucose is elevated, no anion gap to suggest DKA.  Troponin flat.  BNP is 466 which is significantly improved since prior recent labs. Testing positive for cocaine   Imaging Studies ordered:  I ordered imaging studies  including DVT study bilateral I independently visualized and interpreted imaging which showed no DVT.  X-ray shows mild soft tissue swelling bilaterally.  No acute fracture.   Cardiac Monitoring: / EKG:  The patient was maintained on a cardiac monitor.  I personally viewed and interpreted the cardiac monitored which showed an underlying rhythm of: Paced rhythm   Consultations Obtained:  Touched base with cardiology.  He was sent here for noncardiac etiology and they have reviewed today's visit. Was not sent  from office for admit.    Problem List / ED Course / Critical interventions / Medication management  Patient presents with complaint of bilateral foot pain.  He has strong dorsal pedal pulses equal bilaterally.  He has no obvious cellulitis on my exam also no large leukocytosis or fever.  He denies injury or fall and no evidence of acute fracture on x-rays.  His BNP is mildly elevated but this is improved since his recent hospital stay.  Chest x-ray shows interval resolution of vascular congestion and stable cardiomegaly.  Ammonia within normal limits.  EtOH is negative.  Urine drug screen is positive for cocaine.  Patient was observed in ER for extended period of time and had improved mentation.  He was able to answer questions more appropriately.  He is feeling better after hydrocodone .  I did discuss with Dr. Michele cardiology and Darryle RIGGERS who clarified that patient was not specifically sent here for cardiac evaluation and does not require admission from cardiac stand point.  Patient was hemodynamically stable throughout stay.  He endorses taking his medications as prescribed.  Discussed discontinuing cocaine use.  Feel appropriate for discharge at this point with outpatient follow-up for recheck of symptoms.  He was given return precautions.      Final diagnoses:  Pain in both feet    ED Discharge Orders     None          Shermon Warren LOISE RIGGERS 02/01/24 2120    Lenor Hollering, MD 02/02/24 [phone]

## 2024-02-01 NOTE — ED Notes (Signed)
 Patient would not stop moving for the EKG

## 2024-02-01 NOTE — Progress Notes (Addendum)
 Patient became agreeable to exam.  BLE venous duplex completed, results given to  Warren Shad, PA-C.   Results can be found under chart review under CV PROC. 02/01/2024 2:24 PM Arthur Speagle RVT, RDMS

## 2024-02-01 NOTE — ED Triage Notes (Addendum)
 Pt states he has had bilateral foot swelling and pain, left more than right, for past week. Pt states he has CHF and takes torsemide  w/o relief. Pt has shortness of breath.

## 2024-02-01 NOTE — ED Notes (Signed)
Pt still unable to give urine sample at this time.

## 2024-02-01 NOTE — Discharge Instructions (Signed)
 Please alternate Tylenol  and ibuprofen  for pain control.  You should also use compression stockings and elevate your feet.  You do not have signs of blood clot on exam.  Your x-ray shows no sign of new fracture or bone infection.  Please return to emergency room with new or worsening symptoms.

## 2024-02-10 ENCOUNTER — Ambulatory Visit: Attending: Physician Assistant | Admitting: Physician Assistant

## 2024-02-10 NOTE — Progress Notes (Deleted)
 Cardiology Office Note:  .   Date:  02/10/2024  ID:  Luis [name], DOB 1971/06/22, MRN [id number] PCP: [facility] Providers Cardiologist:  Alm Clay, MD Electrophysiologist:  Eulas FORBES Furbish, MD {  History of Present Illness: .   Luis [name] is a 53 y.o. male w/PMHx of  HTN, HLD, DM, OSA w/CPAP, peripheral neuropathy Substance abuse (cocaine) NICM, chronic CHF VT (?) CRT-D  Saw Dr. Furbish 07/13/22, normal device function.  Has had a couple admissions  Most recent 01/04/24 - 01/10/24 respiratory failure and mixed shock (suspected septic (PNA) and cardiogenic). Respiratory status has improved with antibiotics and HF therapies   Saw the AHF team 02/01/24 Visit limited by patient's odd behavior, pacing in room, unwilling to remove sunglasses, came bag of pill bottles (>30 bottles and had 3 pill boxes), unaware of what he was taking/not taking Was accompanied by someone > said he had been d/c with [facility] though pt never called to get started Was ultimately unable to perform exam, unknown what meds he was on Not an advanced tx candidate A quick look at his device mentions an event labeled VF > though pt unwilling to wait for full interrogation  EKG was SR/V paced  NO REMOTES  Today's visit is scheduled as an annual device visit ROS:   *** no remotes *** CXR !!!! *** EKG isnt awful *** BP% *** VT??? VF????  Device information MDT CRT-D implanted ***, gen change 03/23/22  Arrhythmia/AAD hx Chart mentions VT, unclear when   Studies Reviewed: SABRA    EKG not done today  DEVICE interrogation done today and reviewed by myself *** Battery and lead measurements are good ***  01/05/24: TTE  1. Left ventricular ejection fraction, by estimation, is <20%. The left  ventricle has severely decreased function. The left ventricle demonstrates  global hypokinesis. Left ventricular diastolic parameters are consistent  with Grade II  diastolic  dysfunction (pseudonormalization).   2. Right ventricular systolic function is normal. The right ventricular  size is normal.   3. The mitral valve is normal in structure. Mild mitral valve  regurgitation. No evidence of mitral stenosis.   4. The aortic valve is tricuspid. Aortic valve regurgitation is not  visualized. No aortic stenosis is present.   5. The inferior vena cava is dilated in size with <50% respiratory  variability, suggesting right atrial pressure of 15 mmHg.     RHC (10/23) showed elevated filling pressures and low output. mRA 14, PAP 45/22, mPCWP 27, LVEDP 32, PA sat 60%, CI 1.77.  LHC showed no significant CAD.    Risk Assessment/Calculations:    Physical Exam:   VS:  There were no vitals taken for this visit.   Wt Readings from Last 3 Encounters:  02/01/24 254 lb (115.2 kg)  02/01/24 254 lb (115.2 kg)  01/25/24 249 lb (112.9 kg)    GEN: Well nourished, well developed in no acute distress NECK: No JVD; No carotid bruits CARDIAC: ***RRR, no murmurs, rubs, gallops RESPIRATORY:  *** CTA b/l without rales, wheezing or rhonchi  ABDOMEN: Soft, non-tender, non-distended EXTREMITIES: *** No edema; No deformity   ICD site: *** is stable, no thinning, fluctuation, tethering  ASSESSMENT AND PLAN: .    CRT-D *** intact function *** no programming changes made  NICM Chronic CHF ***     {Are you ordering a CV Procedure (e.g. stress test, cath, DCCV, TEE, etc)?   Press F2        :  [id number]}     Dispo: ***  Signed, Charlies Macario Arthur, PA-C

## 2024-02-11 ENCOUNTER — Encounter: Payer: Self-pay | Admitting: Physician Assistant

## 2024-02-18 ENCOUNTER — Emergency Department (HOSPITAL_COMMUNITY)

## 2024-02-18 ENCOUNTER — Inpatient Hospital Stay (HOSPITAL_COMMUNITY)
Admission: EM | Admit: 2024-02-18 | Discharge: 2024-02-25 | DRG: 291 | Disposition: A | Discharge status: Home / Self Care | Attending: Internal Medicine | Admitting: Internal Medicine

## 2024-02-18 DIAGNOSIS — Z880 Allergy status to penicillin: Secondary | ICD-10-CM

## 2024-02-18 DIAGNOSIS — Z91040 Latex allergy status: Secondary | ICD-10-CM

## 2024-02-18 DIAGNOSIS — E785 Hyperlipidemia, unspecified: Secondary | ICD-10-CM | POA: Diagnosis present

## 2024-02-18 DIAGNOSIS — J9621 Acute and chronic respiratory failure with hypoxia: Secondary | ICD-10-CM | POA: Diagnosis present

## 2024-02-18 DIAGNOSIS — F17210 Nicotine dependence, cigarettes, uncomplicated: Secondary | ICD-10-CM | POA: Diagnosis present

## 2024-02-18 DIAGNOSIS — E878 Other disorders of electrolyte and fluid balance, not elsewhere classified: Secondary | ICD-10-CM | POA: Diagnosis present

## 2024-02-18 DIAGNOSIS — I428 Other cardiomyopathies: Secondary | ICD-10-CM | POA: Diagnosis present

## 2024-02-18 DIAGNOSIS — F1490 Cocaine use, unspecified, uncomplicated: Secondary | ICD-10-CM | POA: Diagnosis present

## 2024-02-18 DIAGNOSIS — E1165 Type 2 diabetes mellitus with hyperglycemia: Secondary | ICD-10-CM | POA: Diagnosis present

## 2024-02-18 DIAGNOSIS — Z227 Latent tuberculosis: Secondary | ICD-10-CM | POA: Diagnosis not present

## 2024-02-18 DIAGNOSIS — Z7902 Long term (current) use of antithrombotics/antiplatelets: Secondary | ICD-10-CM

## 2024-02-18 DIAGNOSIS — F411 Generalized anxiety disorder: Secondary | ICD-10-CM | POA: Diagnosis present

## 2024-02-18 DIAGNOSIS — I5043 Acute on chronic combined systolic (congestive) and diastolic (congestive) heart failure: Secondary | ICD-10-CM | POA: Diagnosis present

## 2024-02-18 DIAGNOSIS — F32A Depression, unspecified: Secondary | ICD-10-CM | POA: Diagnosis present

## 2024-02-18 DIAGNOSIS — I2489 Other forms of acute ischemic heart disease: Secondary | ICD-10-CM | POA: Diagnosis present

## 2024-02-18 DIAGNOSIS — Z91041 Radiographic dye allergy status: Secondary | ICD-10-CM

## 2024-02-18 DIAGNOSIS — E1142 Type 2 diabetes mellitus with diabetic polyneuropathy: Secondary | ICD-10-CM | POA: Diagnosis present

## 2024-02-18 DIAGNOSIS — Z794 Long term (current) use of insulin: Secondary | ICD-10-CM

## 2024-02-18 DIAGNOSIS — Z91010 Allergy to peanuts: Secondary | ICD-10-CM

## 2024-02-18 DIAGNOSIS — I2510 Atherosclerotic heart disease of native coronary artery without angina pectoris: Secondary | ICD-10-CM | POA: Diagnosis present

## 2024-02-18 DIAGNOSIS — G894 Chronic pain syndrome: Secondary | ICD-10-CM | POA: Diagnosis present

## 2024-02-18 DIAGNOSIS — I5023 Acute on chronic systolic (congestive) heart failure: Secondary | ICD-10-CM | POA: Diagnosis not present

## 2024-02-18 DIAGNOSIS — Z888 Allergy status to other drugs, medicaments and biological substances status: Secondary | ICD-10-CM

## 2024-02-18 DIAGNOSIS — Z8615 Personal history of latent tuberculosis infection: Secondary | ICD-10-CM

## 2024-02-18 DIAGNOSIS — N179 Acute kidney failure, unspecified: Secondary | ICD-10-CM | POA: Diagnosis not present

## 2024-02-18 DIAGNOSIS — F4310 Post-traumatic stress disorder, unspecified: Secondary | ICD-10-CM | POA: Diagnosis present

## 2024-02-18 DIAGNOSIS — I110 Hypertensive heart disease with heart failure: Principal | ICD-10-CM | POA: Diagnosis present

## 2024-02-18 DIAGNOSIS — E871 Hypo-osmolality and hyponatremia: Secondary | ICD-10-CM | POA: Diagnosis not present

## 2024-02-18 DIAGNOSIS — E1169 Type 2 diabetes mellitus with other specified complication: Secondary | ICD-10-CM | POA: Diagnosis present

## 2024-02-18 DIAGNOSIS — Z5900 Homelessness unspecified: Secondary | ICD-10-CM | POA: Diagnosis not present

## 2024-02-18 DIAGNOSIS — D649 Anemia, unspecified: Secondary | ICD-10-CM | POA: Diagnosis present

## 2024-02-18 DIAGNOSIS — K219 Gastro-esophageal reflux disease without esophagitis: Secondary | ICD-10-CM | POA: Diagnosis present

## 2024-02-18 DIAGNOSIS — G4733 Obstructive sleep apnea (adult) (pediatric): Secondary | ICD-10-CM | POA: Diagnosis present

## 2024-02-18 DIAGNOSIS — Z79899 Other long term (current) drug therapy: Secondary | ICD-10-CM

## 2024-02-18 DIAGNOSIS — E66813 Obesity, class 3: Secondary | ICD-10-CM | POA: Diagnosis present

## 2024-02-18 DIAGNOSIS — I447 Left bundle-branch block, unspecified: Secondary | ICD-10-CM | POA: Diagnosis present

## 2024-02-18 DIAGNOSIS — Z8249 Family history of ischemic heart disease and other diseases of the circulatory system: Secondary | ICD-10-CM

## 2024-02-18 DIAGNOSIS — Z6841 Body Mass Index (BMI) 40.0 and over, adult: Secondary | ICD-10-CM | POA: Diagnosis not present

## 2024-02-18 DIAGNOSIS — I10 Essential (primary) hypertension: Secondary | ICD-10-CM | POA: Diagnosis not present

## 2024-02-18 DIAGNOSIS — Z91048 Other nonmedicinal substance allergy status: Secondary | ICD-10-CM

## 2024-02-18 DIAGNOSIS — Z95810 Presence of automatic (implantable) cardiac defibrillator: Secondary | ICD-10-CM

## 2024-02-18 LAB — BASIC METABOLIC PANEL WITH GFR
Anion gap: 6 (ref 5–15)
BUN: 15 mg/dL (ref 6–20)
CO2: 24 mmol/L (ref 22–32)
Calcium: 8 mg/dL — ABNORMAL LOW (ref 8.9–10.3)
Chloride: 108 mmol/L (ref 98–111)
Creatinine, Ser: 0.75 mg/dL (ref 0.61–1.24)
GFR, Estimated: >60 mL/min (ref >60)
Glucose, Bld: 177 mg/dL — ABNORMAL HIGH (ref 70–99)
Potassium: 4.1 mmol/L (ref 3.5–5.1)
Sodium: 138 mmol/L (ref 135–145)

## 2024-02-18 LAB — CBC WITH DIFFERENTIAL/PLATELET
Abs Immature Granulocytes: 0.02 K/uL (ref 0.00–0.07)
Basophils Absolute: 0 K/uL (ref 0.0–0.1)
Basophils Relative: 0 %
Eosinophils Absolute: 0.1 K/uL (ref 0.0–0.5)
Eosinophils Relative: 1 %
HCT: 34.9 % — ABNORMAL LOW (ref 39.0–52.0)
Hemoglobin: 11.4 g/dL — ABNORMAL LOW (ref 13.0–17.0)
Immature Granulocytes: 0 %
Lymphocytes Relative: 26 %
Lymphs Abs: 2.5 K/uL (ref 0.7–4.0)
MCH: 29.4 pg (ref 26.0–34.0)
MCHC: 32.7 g/dL (ref 30.0–36.0)
MCV: 89.9 fL (ref 80.0–100.0)
Monocytes Absolute: 0.7 K/uL (ref 0.1–1.0)
Monocytes Relative: 8 %
Neutro Abs: 6.3 K/uL (ref 1.7–7.7)
Neutrophils Relative %: 65 %
Platelets: 277 K/uL (ref 150–400)
RBC: 3.88 MIL/uL — ABNORMAL LOW (ref 4.22–5.81)
RDW: 13 % (ref 11.5–15.5)
WBC: 9.7 K/uL (ref 4.0–10.5)
nRBC: 0 % (ref 0.0–0.2)

## 2024-02-18 LAB — RAPID URINE DRUG SCREEN, HOSP PERFORMED
Amphetamines: NOT DETECTED
Barbiturates: NOT DETECTED
Benzodiazepines: NOT DETECTED
Cocaine: POSITIVE — AB
Opiates: NOT DETECTED
Tetrahydrocannabinol: NOT DETECTED

## 2024-02-18 LAB — TROPONIN I (HIGH SENSITIVITY)
Troponin I (High Sensitivity): 20 ng/L — ABNORMAL HIGH (ref <18)
Troponin I (High Sensitivity): 21 ng/L — ABNORMAL HIGH (ref <18)

## 2024-02-18 LAB — BRAIN NATRIURETIC PEPTIDE: B Natriuretic Peptide: 452.1 pg/mL — ABNORMAL HIGH (ref 0.0–100.0)

## 2024-02-18 MED ORDER — FENTANYL CITRATE PF 50 MCG/ML IJ SOSY
50.0000 ug | PREFILLED_SYRINGE | Freq: Once | INTRAMUSCULAR | Status: AC
  Administered 2024-02-18: 50 ug via INTRAVENOUS
  Filled 2024-02-18: qty 1

## 2024-02-18 MED ORDER — FUROSEMIDE 10 MG/ML IJ SOLN: 40.0000 mg | Freq: Once | INTRAMUSCULAR | Status: DC

## 2024-02-18 MED ORDER — HYDROMORPHONE HCL 1 MG/ML IJ SOLN
1.0000 mg | Freq: Once | INTRAMUSCULAR | Status: AC
  Administered 2024-02-18: 1 mg via INTRAVENOUS
  Filled 2024-02-18: qty 1

## 2024-02-18 MED ORDER — FUROSEMIDE 10 MG/ML IJ SOLN
40.0000 mg | Freq: Once | INTRAMUSCULAR | Status: AC
  Administered 2024-02-18: 40 mg via INTRAVENOUS

## 2024-02-18 NOTE — ED Triage Notes (Signed)
 BIB [facility] from home for CHF, edema and FVO. More short of breath in the past week. More edema in abdomen, feet & legs. Compliant with diuretics. Pain in feet. 2L at baseline , 97% 95 HR 116/70 203 cbg

## 2024-02-18 NOTE — ED Provider Notes (Signed)
 Luis [name] EMERGENCY DEPARTMENT AT [facility] Provider Note   CSN: [id number] Arrival date & time: 02/18/24  1715     Patient presents with: No chief complaint on file.   Luis [name] is a 53 y.o. male patient with history of chronic systolic heart failure, diabetes, hyperlipidemia, hypertension, cocaine use who presents to the emerged apartment today for further evaluation of shortness of breath worse than his baseline has been progressive worsening of the last few days.  Patient is normally on 2 L of oxygen.  Unable to lay flat at night secondary to shortness of breath.  He is also complaining of bilateral lower extremity swelling and pain in his bilateral feet.  When asked if patient was taking Lasix  he was unsure.  Mild associated chest pain.  No abdominal pain, nausea, vomiting, diarrhea.  Last used cocaine 4 days ago. Typically only uses when he is down and depressed. No suicidal ideations.    HPI     Prior to Admission medications   Medication Sig Start Date End Date Taking? Authorizing Provider  bumetanide  (BUMEX ) 2 MG tablet Take 2 mg by mouth 2 (two) times daily. 06/29/23  Yes [provider]  carvedilol  (COREG ) 12.5 MG tablet Take 0.5 tablets by mouth 2 (two) times daily with a meal.   Yes [provider]  cholecalciferol (VITAMIN D3) 25 MCG (1000 UNIT) tablet Take 1,000 Units by mouth daily.   Yes [provider]  dapagliflozin  propanediol (FARXIGA ) 10 MG TABS tablet Take 10 mg by mouth daily.   Yes [provider]  diclofenac  Sodium (VOLTAREN ) 1 % GEL Apply 2 g topically 4 (four) times daily. 10/20/22  Yes [provider]  digoxin  (LANOXIN ) 0.125 MG tablet Take 1 tablet by mouth daily.   Yes [provider]  DULoxetine  (CYMBALTA ) 30 MG capsule Take 30 mg by mouth daily.   Yes [provider]  hydrOXYzine  (VISTARIL ) 25 MG capsule Take 25 mg by mouth 3 (three) times daily as needed for anxiety.    Yes [provider]  insulin  aspart (NOVOLOG  FLEXPEN) 100 UNIT/ML FlexPen Inject 15 Units into the skin 3 (three) times daily with meals. Patient taking differently: Inject 25 Units into the skin 3 (three) times daily with meals. 06/28/23  Yes Angiulli, Toribio PARAS, PA-C  isoniazid  (NYDRAZID ) 300 MG tablet Take 300 mg by mouth in the morning.   Yes [provider]  loratadine  (CLARITIN ) 10 MG tablet Take 10 mg by mouth daily.   Yes [provider]  mirtazapine (REMERON) 15 MG tablet Take 0.5 tablets by mouth at bedtime as needed (sleep).   Yes [provider]  Multiple Vitamin (MULTIVITAMIN) tablet Take 1 tablet by mouth daily.   Yes [provider]  pantoprazole  (PROTONIX ) 40 MG tablet Take 40 mg by mouth 2 (two) times daily.   Yes [provider]  Potassium Chloride  ER 20 MEQ TBCR Take 1 tablet by mouth daily.   Yes [provider]  prazosin  (MINIPRESS ) 2 MG capsule Take 2 mg by mouth at bedtime.   Yes [provider]  pyridOXINE  (B-6) 50 MG tablet Take 50 mg by mouth daily.   Yes [provider]  rosuvastatin  (CRESTOR ) 40 MG tablet Take 20 mg by mouth daily.   Yes [provider]  sacubitril -valsartan  (ENTRESTO ) 24-26 MG Take 1 tablet by mouth 2 (two) times daily. 01/25/24  Yes Colletta Manuelita Garre, PA-C  spironolactone  (ALDACTONE ) 25 MG tablet Take 1 tablet (25 mg  total) by mouth daily. 01/10/24  Yes [name], MD  thiamine  (VITAMIN B1) 100 MG tablet Take 1 tablet (100 mg total) by mouth daily. 01/07/24  Yes Hindel, Leah, MD  traZODone  (DESYREL ) 150 MG tablet Take 300 mg by mouth at bedtime as needed for sleep.   Yes [provider]  clopidogrel  (PLAVIX ) 75 MG tablet Take 75 mg by mouth daily. Patient not taking: Reported on 02/18/2024    [provider]  furosemide  (LASIX ) 80 MG tablet Take 1 tablet (80 mg total) by mouth daily. Patient not taking: Reported on 02/18/2024 01/25/24   Colletta Manuelita Garre, PA-C  Insulin  Pen Needle 32G X 4 MM MISC Use as directed up to four times daily 06/28/23   Angiulli, Toribio PARAS, PA-C  metoprolol  succinate (TOPROL -XL) 25 MG 24 hr tablet Take 1 tablet (25 mg total) by mouth 2 (two) times daily with a meal. Patient not taking: Reported on 02/18/2024 01/10/24   [name], MD    Allergies: Aspirin, Iodine-131, Peanut (diagnostic), Iodinated contrast media, Latex, Penicillins, Ultram  [tramadol ], Zestril [lisinopril], Cinnamon, Amoxicillin-pot clavulanate, and Lidocaine     Review of Systems  All other systems reviewed and are negative.   Updated Vital Signs BP 137/80   Pulse 97   Temp 98.3 F (36.8 C) (Oral)   Resp 15   SpO2 100%   Physical Exam Vitals and nursing note reviewed.  Constitutional:      General: He is not in acute distress.    Appearance: Normal appearance.  HENT:     Head: Normocephalic and atraumatic.  Eyes:     General:        Right eye: No discharge.        Left eye: No discharge.  Cardiovascular:     Comments: Regular rate and rhythm.  S1/S2 are distinct without any evidence of murmur, rubs, or gallops.  Radial pulses are 2+ bilaterally.  Dorsalis pedis pulses are 2+ bilaterally. Bilateral pitting edema up to the mid shin.  Pulmonary:     Comments: Clear to auscultation bilaterally.  Normal effort.  No respiratory distress.  No evidence of wheezes, rales, or rhonchi heard throughout. Abdominal:     General: Abdomen is flat. Bowel sounds are normal. There is no distension.     Tenderness: There is no abdominal tenderness. There is no guarding or rebound.  Musculoskeletal:        General: Normal range of motion.     Cervical back: Neck supple.     Right lower leg: 1+ Pitting Edema present.     Left lower leg: 1+ Pitting Edema present.  Skin:    General: Skin is warm and dry.     Findings: No rash.  Neurological:     General: No focal deficit present.     Mental Status: He is alert.  Psychiatric:         Mood and Affect: Mood normal.        Behavior: Behavior normal.     (all labs ordered are listed, but only abnormal results are displayed) Labs Reviewed  CBC WITH DIFFERENTIAL/PLATELET - Abnormal; Notable for the following components:      Result Value   RBC 3.88 (*)    Hemoglobin 11.4 (*)    HCT 34.9 (*)    All other components within normal limits  BASIC METABOLIC PANEL WITH GFR - Abnormal; Notable for the following components:   Glucose, Bld 177 (*)    Calcium  8.0 (*)  All other components within normal limits  BRAIN NATRIURETIC PEPTIDE - Abnormal; Notable for the following components:   B Natriuretic Peptide 452.1 (*)    All other components within normal limits  RAPID URINE DRUG SCREEN, HOSP PERFORMED - Abnormal; Notable for the following components:   Cocaine POSITIVE (*)    All other components within normal limits  TROPONIN I (HIGH SENSITIVITY) - Abnormal; Notable for the following components:   Troponin I (High Sensitivity) 20 (*)    All other components within normal limits  TROPONIN I (HIGH SENSITIVITY) - Abnormal; Notable for the following components:   Troponin I (High Sensitivity) 21 (*)    All other components within normal limits    EKG: EKG Interpretation Date/Time:  Friday February 18 2024 17:41:34 EDT Ventricular Rate:  97 PR Interval:  164 QRS Duration:  136 QT Interval:  417 QTC Calculation: 530 R Axis:   120  Text Interpretation: paced Nonspecific intraventricular conduction delay when compared to prior, similar paced rhythm No STEMI Confirmed by Ginger Barefoot ([id number]) on 02/18/2024 6:45:27 PM  Radiology: [facility] Chest Port 1 View Result Date: 02/18/2024 CLINICAL DATA:  Chest pain. Congestive heart failure, edema, shortness of breath. EXAM: PORTABLE CHEST 1 VIEW COMPARISON:  02/01/2024 FINDINGS: Cardiac pacemaker. Cardiac enlargement with mild vascular congestion. No airspace disease or consolidation. No pleural effusion or pneumothorax. Mediastinal  contours appear intact. IMPRESSION: Cardiac enlargement with mild vascular congestion.  No edema. Electronically Signed   By: Elsie Gravely M.D.   On: 02/18/2024 19:10     Ultrasound ED Echo  Date/Time: 02/18/2024 5:36 PM  Performed by: Theotis Cameron HERO, PA-C Authorized by: Theotis Cameron HERO, PA-C   Procedure details:    Indications: dyspnea     Views: subxiphoid, parasternal long axis view, parasternal short axis view and IVC view     Images: not archived     Limitations:  Body habitus Findings:    Pericardium: small pericardial effusion     LV Function: depressed (30 - 50%)     IVC comment:  Not visualized Impression:    Impression: probable elevated CVP      Medications Ordered in the ED  fentaNYL  (SUBLIMAZE ) injection 50 mcg (50 mcg Intravenous Given 02/18/24 1745)  fentaNYL  (SUBLIMAZE ) injection 50 mcg (50 mcg Intravenous Given 02/18/24 1947)  furosemide  (LASIX ) injection 40 mg (40 mg Intravenous Given 02/18/24 2113)  HYDROmorphone  (DILAUDID ) injection 1 mg (1 mg Intravenous Given 02/18/24 2151)    Clinical Course as of 02/18/24 2210  Fri Feb 18, 2024  2205 CBC with Differential(!) Mild anemia slightly worse than baseline. [CF]  2205 Basic metabolic panel(!) Negative. [CF]  2205 Troponin I (High Sensitivity)(!) Initial and delta troponin are elevated and flat. [CF]  2205 Brain natriuretic peptide(!) Elevated but seems to be somewhat at patient's baseline. [CF]  2205 DG Chest Port 1 View Mild pulmonary congestion.  Cardiomegaly.  I do agree with radiology interpretation. [CF]  2206 I spoke with Dr. Bryn with [facility]  hospitalist who agrees to admit the patient. [CF]    Clinical Course User Index [CF] Theotis Cameron HERO, PA-C    Medical Decision Making RAMESSES CRAMPTON is a 53 y.o. male patient who presents to the emergency department today for further evaluation of shortness of breath.  Patient currently on 4 L at 100%.  Normally on 2 L.  Indicative of acute on  chronic hypoxic respiratory failure.  Otherwise vitals are normal.  Bedside echocardiogram did show some decreased ejection fraction by estimation.  IVC not visualized.  No obvious curly B-lines on echo.  Lungs were clear.  Will hold off on Lasix  right now until we get BNP and chest x-ray back.  Will also get some basic labs troponin, EKG as well. Pain medicine ordered.   Patient clinically does appear to be slightly volume overloaded.  Given the BNP, mild vascular congestion, findings on echocardiogram I will start him on some Lasix .  Patient agreeable with coming in for admission for diuresis.  I spoke with [facility]  hospitalist who agrees to admit the patient.  Patient stable for admission at this time.  Amount and/or Complexity of Data Reviewed Labs: ordered. Decision-making details documented in ED Course. Radiology: ordered. Decision-making details documented in ED Course.  Risk Prescription drug management. Decision regarding hospitalization.     Final diagnoses:  Acute on chronic systolic congestive heart failure (HCC)  Acute on chronic hypoxic respiratory failure [facility])    ED Discharge Orders     None          Theotis Cameron HERO, [place] 02/18/24 2210    Tegeler, Lonni PARAS, MD 02/18/24 [phone]

## 2024-02-19 ENCOUNTER — Encounter (HOSPITAL_COMMUNITY): Payer: Self-pay | Admitting: Family Medicine

## 2024-02-19 DIAGNOSIS — F411 Generalized anxiety disorder: Secondary | ICD-10-CM | POA: Diagnosis not present

## 2024-02-19 DIAGNOSIS — K219 Gastro-esophageal reflux disease without esophagitis: Secondary | ICD-10-CM | POA: Diagnosis not present

## 2024-02-19 DIAGNOSIS — E1169 Type 2 diabetes mellitus with other specified complication: Secondary | ICD-10-CM

## 2024-02-19 DIAGNOSIS — I10 Essential (primary) hypertension: Secondary | ICD-10-CM | POA: Diagnosis not present

## 2024-02-19 DIAGNOSIS — E785 Hyperlipidemia, unspecified: Secondary | ICD-10-CM

## 2024-02-19 DIAGNOSIS — E66813 Obesity, class 3: Secondary | ICD-10-CM

## 2024-02-19 DIAGNOSIS — Z227 Latent tuberculosis: Secondary | ICD-10-CM

## 2024-02-19 DIAGNOSIS — F1490 Cocaine use, unspecified, uncomplicated: Secondary | ICD-10-CM

## 2024-02-19 DIAGNOSIS — J9621 Acute and chronic respiratory failure with hypoxia: Secondary | ICD-10-CM | POA: Insufficient documentation

## 2024-02-19 DIAGNOSIS — I5023 Acute on chronic systolic (congestive) heart failure: Secondary | ICD-10-CM | POA: Diagnosis not present

## 2024-02-19 LAB — GLUCOSE, CAPILLARY
Glucose-Capillary: 267 mg/dL — ABNORMAL HIGH (ref 70–99)
Glucose-Capillary: 163 mg/dL — ABNORMAL HIGH (ref 70–99)
Glucose-Capillary: 179 mg/dL — ABNORMAL HIGH (ref 70–99)
Glucose-Capillary: 180 mg/dL — ABNORMAL HIGH (ref 70–99)
Glucose-Capillary: 280 mg/dL — ABNORMAL HIGH (ref 70–99)

## 2024-02-19 LAB — BASIC METABOLIC PANEL WITH GFR
Anion gap: 6 (ref 5–15)
BUN: 12 mg/dL (ref 6–20)
CO2: 27 mmol/L (ref 22–32)
Calcium: 8.6 mg/dL — ABNORMAL LOW (ref 8.9–10.3)
Chloride: 104 mmol/L (ref 98–111)
Creatinine, Ser: 0.78 mg/dL (ref 0.61–1.24)
GFR, Estimated: >60 mL/min (ref >60)
Glucose, Bld: 198 mg/dL — ABNORMAL HIGH (ref 70–99)
Potassium: 3.7 mmol/L (ref 3.5–5.1)
Sodium: 137 mmol/L (ref 135–145)

## 2024-02-19 LAB — CBC
HCT: 37.1 % — ABNORMAL LOW (ref 39.0–52.0)
Hemoglobin: 12.1 g/dL — ABNORMAL LOW (ref 13.0–17.0)
MCH: 28.9 pg (ref 26.0–34.0)
MCHC: 32.6 g/dL (ref 30.0–36.0)
MCV: 88.5 fL (ref 80.0–100.0)
Platelets: 273 K/uL (ref 150–400)
RBC: 4.19 MIL/uL — ABNORMAL LOW (ref 4.22–5.81)
RDW: 13.1 % (ref 11.5–15.5)
WBC: 9.6 K/uL (ref 4.0–10.5)
nRBC: 0 % (ref 0.0–0.2)

## 2024-02-19 MED ORDER — OXYCODONE HCL 5 MG PO TABS
5.0000 mg | ORAL_TABLET | Freq: Four times a day (QID) | ORAL | Status: DC | PRN
  Administered 2024-02-19 (×2): 5 mg via ORAL
  Filled 2024-02-19 (×2): qty 1

## 2024-02-19 MED ORDER — SACUBITRIL-VALSARTAN 24-26 MG PO TABS
1.0000 | ORAL_TABLET | Freq: Two times a day (BID) | ORAL | Status: DC
  Administered 2024-02-19 – 2024-02-25 (×13): 1 via ORAL
  Filled 2024-02-19 (×13): qty 1

## 2024-02-19 MED ORDER — SPIRONOLACTONE 25 MG PO TABS
25.0000 mg | ORAL_TABLET | Freq: Every day | ORAL | Status: DC
  Administered 2024-02-19 – 2024-02-25 (×7): 25 mg via ORAL
  Filled 2024-02-19 (×7): qty 1

## 2024-02-19 MED ORDER — ISONIAZID 300 MG PO TABS
300.0000 mg | ORAL_TABLET | Freq: Every day | ORAL | Status: DC
  Administered 2024-02-19 – 2024-02-25 (×7): 300 mg via ORAL
  Filled 2024-02-19 (×7): qty 1

## 2024-02-19 MED ORDER — DAPAGLIFLOZIN PROPANEDIOL 10 MG PO TABS
10.0000 mg | ORAL_TABLET | Freq: Every day | ORAL | Status: DC
  Administered 2024-02-19 – 2024-02-25 (×7): 10 mg via ORAL
  Filled 2024-02-19 (×7): qty 1

## 2024-02-19 MED ORDER — POTASSIUM CHLORIDE CRYS ER 20 MEQ PO TBCR
40.0000 meq | EXTENDED_RELEASE_TABLET | Freq: Once | ORAL | Status: AC
Start: 2024-02-19 — End: 2024-02-19
  Administered 2024-02-19: 40 meq via ORAL
  Filled 2024-02-19: qty 2

## 2024-02-19 MED ORDER — ACETAMINOPHEN 650 MG RE SUPP
650.0000 mg | Freq: Four times a day (QID) | RECTAL | Status: DC | PRN
Start: 2024-02-19 — End: 2024-02-25

## 2024-02-19 MED ORDER — CLOPIDOGREL BISULFATE 75 MG PO TABS
75.0000 mg | ORAL_TABLET | Freq: Every day | ORAL | Status: DC
  Administered 2024-02-19 – 2024-02-25 (×7): 75 mg via ORAL
  Filled 2024-02-19 (×6): qty 1

## 2024-02-19 MED ORDER — DIGOXIN 125 MCG PO TABS
125.0000 ug | ORAL_TABLET | Freq: Every day | ORAL | Status: DC
  Administered 2024-02-19 – 2024-02-25 (×7): 125 ug via ORAL
  Filled 2024-02-19 (×7): qty 1

## 2024-02-19 MED ORDER — INSULIN ASPART 100 UNIT/ML IJ SOLN
0.0000 [IU] | Freq: Three times a day (TID) | INTRAMUSCULAR | Status: DC
  Administered 2024-02-19 – 2024-02-20 (×3): 3 [IU] via SUBCUTANEOUS
  Administered 2024-02-20: 5 [IU] via SUBCUTANEOUS
  Administered 2024-02-20: 11 [IU] via SUBCUTANEOUS
  Administered 2024-02-21: 5 [IU] via SUBCUTANEOUS
  Administered 2024-02-21: 8 [IU] via SUBCUTANEOUS
  Administered 2024-02-21: 5 [IU] via SUBCUTANEOUS
  Administered 2024-02-22 (×2): 3 [IU] via SUBCUTANEOUS
  Administered 2024-02-22: 8 [IU] via SUBCUTANEOUS
  Administered 2024-02-23: 5 [IU] via SUBCUTANEOUS
  Administered 2024-02-23: 8 [IU] via SUBCUTANEOUS
  Administered 2024-02-23: 2 [IU] via SUBCUTANEOUS
  Administered 2024-02-24: 3 [IU] via SUBCUTANEOUS
  Administered 2024-02-24: 5 [IU] via SUBCUTANEOUS
  Administered 2024-02-24 – 2024-02-25 (×2): 8 [IU] via SUBCUTANEOUS

## 2024-02-19 MED ORDER — DULOXETINE HCL 30 MG PO CPEP
30.0000 mg | ORAL_CAPSULE | Freq: Every day | ORAL | Status: DC
  Administered 2024-02-19 – 2024-02-25 (×7): 30 mg via ORAL
  Filled 2024-02-19 (×7): qty 1

## 2024-02-19 MED ORDER — HYDROXYZINE HCL 25 MG PO TABS
25.0000 mg | ORAL_TABLET | Freq: Three times a day (TID) | ORAL | Status: DC | PRN
  Administered 2024-02-19: 25 mg via ORAL
  Filled 2024-02-19: qty 1

## 2024-02-19 MED ORDER — ENOXAPARIN SODIUM 40 MG/0.4ML IJ SOSY
40.0000 mg | PREFILLED_SYRINGE | INTRAMUSCULAR | Status: DC
  Administered 2024-02-21 – 2024-02-25 (×4): 40 mg via SUBCUTANEOUS
  Filled 2024-02-19 (×7): qty 0.4

## 2024-02-19 MED ORDER — ROSUVASTATIN CALCIUM 20 MG PO TABS
20.0000 mg | ORAL_TABLET | Freq: Every day | ORAL | Status: DC
  Administered 2024-02-19 – 2024-02-25 (×7): 20 mg via ORAL
  Filled 2024-02-19 (×7): qty 1

## 2024-02-19 MED ORDER — VITAMIN B-6 25 MG PO TABS
50.0000 mg | ORAL_TABLET | Freq: Every day | ORAL | Status: DC
  Administered 2024-02-19 – 2024-02-25 (×7): 50 mg via ORAL
  Filled 2024-02-19 (×7): qty 2

## 2024-02-19 MED ORDER — PRAZOSIN HCL 2 MG PO CAPS
2.0000 mg | ORAL_CAPSULE | Freq: Every day | ORAL | Status: DC
  Administered 2024-02-19 – 2024-02-24 (×7): 2 mg via ORAL
  Filled 2024-02-19 (×7): qty 1

## 2024-02-19 MED ORDER — NICOTINE 21 MG/24HR TD PT24: 21.0000 mg | MEDICATED_PATCH | Freq: Every day | TRANSDERMAL | Status: DC | PRN

## 2024-02-19 MED ORDER — GABAPENTIN 300 MG PO CAPS
300.0000 mg | ORAL_CAPSULE | Freq: Two times a day (BID) | ORAL | Status: DC
  Administered 2024-02-19 – 2024-02-23 (×11): 300 mg via ORAL
  Filled 2024-02-19 (×11): qty 1

## 2024-02-19 MED ORDER — MIRTAZAPINE 15 MG PO TABS
7.5000 mg | ORAL_TABLET | Freq: Every evening | ORAL | Status: DC | PRN
  Administered 2024-02-20 – 2024-02-24 (×4): 7.5 mg via ORAL
  Filled 2024-02-19 (×4): qty 1

## 2024-02-19 MED ORDER — ACETAMINOPHEN 325 MG PO TABS
650.0000 mg | ORAL_TABLET | Freq: Four times a day (QID) | ORAL | Status: DC | PRN
  Administered 2024-02-19 – 2024-02-23 (×5): 650 mg via ORAL
  Filled 2024-02-19 (×5): qty 2

## 2024-02-19 MED ORDER — OXYCODONE HCL 5 MG PO TABS
5.0000 mg | ORAL_TABLET | Freq: Four times a day (QID) | ORAL | Status: DC | PRN
  Administered 2024-02-19 – 2024-02-25 (×17): 5 mg via ORAL
  Filled 2024-02-19 (×17): qty 1

## 2024-02-19 MED ORDER — PANTOPRAZOLE SODIUM 40 MG PO TBEC
40.0000 mg | DELAYED_RELEASE_TABLET | Freq: Two times a day (BID) | ORAL | Status: DC
  Administered 2024-02-19 – 2024-02-25 (×13): 40 mg via ORAL
  Filled 2024-02-19 (×13): qty 1

## 2024-02-19 MED ORDER — INSULIN ASPART 100 UNIT/ML IJ SOLN
0.0000 [IU] | Freq: Every day | INTRAMUSCULAR | Status: DC
  Administered 2024-02-19 – 2024-02-20 (×3): 3 [IU] via SUBCUTANEOUS
  Administered 2024-02-21: 2 [IU] via SUBCUTANEOUS
  Administered 2024-02-22: 3 [IU] via SUBCUTANEOUS
  Administered 2024-02-23: 2 [IU] via SUBCUTANEOUS
  Administered 2024-02-24: 3 [IU] via SUBCUTANEOUS

## 2024-02-19 MED ORDER — FUROSEMIDE 10 MG/ML IJ SOLN
80.0000 mg | Freq: Two times a day (BID) | INTRAMUSCULAR | Status: DC
  Administered 2024-02-19 – 2024-02-20 (×3): 80 mg via INTRAVENOUS
  Filled 2024-02-19 (×3): qty 8

## 2024-02-19 NOTE — Assessment & Plan Note (Signed)
 Continue pantoprazole .

## 2024-02-19 NOTE — Hospital Course (Signed)
 [name] was admitted to the hospital with the working diagnosis of heart failure exacerbation.   53 yo male with the past medical history of heart failure, hypertension, dyslipidemia, T2DM, cocaine use, latent TB and depression who presented with lower extremity painful edema. He reported worsening symptoms, having difficulty ambulating.  EMS was called and he was found edematous with 02 saturation 97% on 4 L/min per [place].  On his initial physical examination his blood pressure was 138/80, HR 97, RR 19 and 02 saturation 100% on supplemental 02 per [place] Lungs with bilateral rales with no wheezing, heart with S1 and S2 present and regular with no gallops, abdomen protuberant but not distended, positive lower extremity edema.   Na 138, K 4.1 CL 108 bicarbonate 24 glucose 177, bun 15 cr 0,75  BNP 452  High sensitive troponin 20  Wbc 9,7 hgb 11.4 plt 277   Chest radiograph with cardiomegaly, bilateral hilar vascular congestion, pacemaker defibrillator in place with one right atrial lead and biventricular leads.   EKG 97 bpm, right axis deviation, qtc 530, left bundle branch block, atrial sensing and ventricular pacing with no significant ST segment or T wave changes.   Patient was placed on IV furosemide  for diuresis.

## 2024-02-19 NOTE — Assessment & Plan Note (Signed)
 Calculated BMI is 43.8  OSA will need Cpap

## 2024-02-19 NOTE — Assessment & Plan Note (Signed)
 Continue glucose cover and monitoring with insulin sliding scale.  Fasting glucose today 141 mg/dl.   Continue with statin

## 2024-02-19 NOTE — Assessment & Plan Note (Signed)
Continue blood pressure control with entresto and spironolactone.

## 2024-02-19 NOTE — H&P (Signed)
 History and Physical    Patient: Luis [name] FMW:[id number] DOB: 1970/12/18 DOA: 02/18/2024 DOS: the patient was seen and examined on 02/19/2024 PCP: [name]  Patient coming from: Home  Chief Complaint: Leg swelling and pain  HPI: Luis [name] is a 53 y.o. male Veteran with a history of chronic HFrEF, history of VT s/p [employer] BiV ICD, tobacco use, OSA on CPAP, anxiety and depression, HTN, HLD, T2DM, cocaine use who presented to the ED with recurrent increasing leg swelling and pain in his feet. He reported taking medications, but was unable to detail which ones, stated he used cocaine 4 days ago. He feels his feet have started swelling more and they are much more painful as a result. The pain is on the soles, like ice, and on the tops like burning, both feet, worse with bearing weight.   He was hypoxemic requiring 4L (baseline 2L), afebrile. Work up in the ED revealed elevated BNP and pulmonary vascular congestion on CXR. IV lasix  started and admission requested for recurrent acute on chronic combined CHF.   Review of Systems: As mentioned in the history of present illness. All other systems reviewed and are negative.  Past Medical History:  Diagnosis Date   Anxiety    Biventricular ICD (implantable cardioverter-defibrillator) in place    Chronic systolic CHF (congestive heart failure) (HCC)    Cocaine use    Diabetes mellitus without complication (HCC)    GERD (gastroesophageal reflux disease)    HLD (hyperlipidemia)    Hypertension    Morbid obesity (HCC)    NICM (nonischemic cardiomyopathy) (HCC)    OSA (obstructive sleep apnea)    PTSD (post-traumatic stress disorder)    on Depakote    Refusal of blood transfusions as patient is Jehovah's Witness    Tobacco abuse    Past Surgical History:  Procedure Laterality Date   BIV ICD INSERTION CRT-D     FOOT FRACTURE SURGERY     ICD GENERATOR CHANGEOUT N/A 03/23/2022   Procedure: ICD GENERATOR  CHANGEOUT;  Surgeon: Nancey, Eulas BRAVO, MD;  Location: [facility] INVASIVE CV LAB;  Service: Cardiovascular;  Laterality: N/A;   RIGHT HEART CATH N/A 03/06/2022   Procedure: RIGHT HEART CATH;  Surgeon: Wendel Lurena POUR, MD;  Location: [facility] INVASIVE CV LAB;  Service: Cardiovascular;  Laterality: N/A;   RIGHT/LEFT HEART CATH AND CORONARY ANGIOGRAPHY N/A 04/16/2022   Procedure: RIGHT/LEFT HEART CATH AND CORONARY ANGIOGRAPHY;  Surgeon: Rolan Ezra RAMAN, MD;  Location: [facility] INVASIVE CV LAB;  Service: Cardiovascular;  Laterality: N/A;   ROTATOR CUFF REPAIR     TONSILLECTOMY     Social History:  reports that he has been smoking cigarettes. He started smoking about 17 years ago. He has a 7.5 pack-year smoking history. He has never used smokeless tobacco. He reports that he does not currently use alcohol . He reports current drug use. Drugs: Cocaine and Marijuana.  Allergies  Allergen Reactions   Aspirin Anaphylaxis and Other (See Comments)    Throat closing    Iodine-131 Hives   Peanut (Diagnostic) Anaphylaxis    Throat closes   Iodinated Contrast Media Hives    Can be premedicated    Latex Hives and Rash   Penicillins Nausea And Vomiting   Ultram  [Tramadol ] Hives and Nausea Only   Zestril [Lisinopril] Cough   Cinnamon    Amoxicillin-Pot Clavulanate Diarrhea   Lidocaine  Rash    Rash from adhesive on lidocaine  patches     Family History  Problem Relation Age  of Onset   Hypertension Mother    Bone cancer Father    Lung cancer Father     Prior to Admission medications   Medication Sig Start Date End Date Taking? Authorizing Provider  bumetanide  (BUMEX ) 2 MG tablet Take 2 mg by mouth 2 (two) times daily. 06/29/23  Yes [provider]  carvedilol  (COREG ) 12.5 MG tablet Take 0.5 tablets by mouth 2 (two) times daily with a meal.   Yes [provider]  cholecalciferol (VITAMIN D3) 25 MCG (1000 UNIT) tablet Take 1,000 Units by mouth daily.   Yes [provider]  dapagliflozin   propanediol (FARXIGA ) 10 MG TABS tablet Take 10 mg by mouth daily.   Yes [provider]  diclofenac  Sodium (VOLTAREN ) 1 % GEL Apply 2 g topically 4 (four) times daily. 10/20/22  Yes [provider]  digoxin  (LANOXIN ) 0.125 MG tablet Take 1 tablet by mouth daily.   Yes [provider]  DULoxetine  (CYMBALTA ) 30 MG capsule Take 30 mg by mouth daily.   Yes [provider]  hydrOXYzine  (VISTARIL ) 25 MG capsule Take 25 mg by mouth 3 (three) times daily as needed for anxiety.   Yes [provider]  insulin  aspart (NOVOLOG  FLEXPEN) 100 UNIT/ML FlexPen Inject 15 Units into the skin 3 (three) times daily with meals. Patient taking differently: Inject 25 Units into the skin 3 (three) times daily with meals. 06/28/23  Yes Angiulli, Toribio PARAS, PA-C  isoniazid  (NYDRAZID ) 300 MG tablet Take 300 mg by mouth in the morning.   Yes [provider]  loratadine  (CLARITIN ) 10 MG tablet Take 10 mg by mouth daily.   Yes [provider]  mirtazapine  (REMERON ) 15 MG tablet Take 0.5 tablets by mouth at bedtime as needed (sleep).   Yes [provider]  Multiple Vitamin (MULTIVITAMIN) tablet Take 1 tablet by mouth daily.   Yes [provider]  pantoprazole  (PROTONIX ) 40 MG tablet Take 40 mg by mouth 2 (two) times daily.   Yes [provider]  Potassium Chloride  ER 20 MEQ TBCR Take 1 tablet by mouth daily.   Yes [provider]  prazosin  (MINIPRESS ) 2 MG capsule Take 2 mg by mouth at bedtime.   Yes [provider]  pyridOXINE  (B-6) 50 MG tablet Take 50 mg by mouth daily.   Yes [provider]  rosuvastatin  (CRESTOR ) 40 MG tablet Take 20 mg by mouth daily.   Yes [provider]  sacubitril -valsartan  (ENTRESTO ) 24-26 MG Take 1 tablet by mouth 2 (two) times daily. 01/25/24  Yes Colletta Manuelita Garre, PA-C  spironolactone  (ALDACTONE ) 25 MG tablet Take 1 tablet (25 mg total) by mouth daily. 01/10/24  Yes [name], MD  thiamine  (VITAMIN B1) 100 MG tablet Take 1 tablet (100 mg total) by mouth daily. 01/07/24  Yes Hindel, Leah, MD  traZODone  (DESYREL ) 150 MG tablet Take 300 mg by mouth at bedtime as needed for sleep.   Yes [provider]  clopidogrel  (PLAVIX ) 75 MG tablet Take 75 mg by mouth daily. Patient not taking: Reported on 02/18/2024    [provider]  furosemide  (LASIX ) 80 MG tablet Take 1 tablet (80 mg total) by mouth daily. Patient not taking: Reported on 02/18/2024 01/25/24   Colletta Manuelita Garre, PA-C  Insulin  Pen Needle 32G X 4 MM MISC Use as directed up to four times daily 06/28/23   Angiulli, Toribio PARAS, PA-C  metoprolol  succinate (TOPROL -XL) 25 MG 24 hr tablet Take 1 tablet (25 mg total)  by mouth 2 (two) times daily with a meal. Patient not taking: Reported on 02/18/2024 01/10/24   [name], MD    Physical Exam: Vitals:   02/18/24 1724 02/18/24 2200 02/18/24 2204 02/18/24 2320  BP: 137/80   134/66  Pulse:      Resp:  15  19  Temp: 98.6 F (37 C)  98.3 F (36.8 C)   TempSrc:   Oral   SpO2:  100%  100%  Gen: Chronically ill-appearing male in no acute distress Pulm: Crackles at bases bilaterally, nonlabored on 4L O2  CV: RRR, no MRG, distant, 2+ pitting edema, no definite JVD GI: Soft, NT, ND, +BS, obese Neuro: Alert and oriented. No new focal deficits. Ext: Warm, no deformities. Skin: Feet with abnormal sensation bilaterally, no visible or palpable deformities or warmth, they are edematous.    Data Reviewed: BNP 452.1, troponin 20 > 21 Hgb 11.4g/dl, normocytic, normal WBC and diff.  SCr 0.77, BUN 15, glucose 177.  UDS +cocaine ECG: Paced with IVCD, stable CXR: +cardiomegaly with pulmonary vascular congestion. Not as much airspace opacities as he's had in the past.   Assessment and Plan: Acute on chronic combined diastolic and systolic heart failure/nonischemic cardiomyopathy: LVEF < 20% on 01/05/2024 with global hypokinesis, G2DD, normal RVSF, mild MR.   - Continue lasix  40mg  IV BID, unclear what he is taking (lasix  80mg  daily and bumex  2mg  BID on med list).  - Weight still pending, was 115.2kg at CHF OV 8/5. Need strict I/O, daily weights.  - Continue entresto , spironolactone , farxiga .      Acute on chronic hypoxic respiratory failure, OSA: Reports usually using 2L O2, now requiring 4L due to acute on chronic HF as above.  - Supplement oxygen to maintain normal WOB and SpO2 >89%.  - Continue CPAP qHS (pt unable to use his CPAP at home due to programming issue (?), hopefully this can get fixed after discharge)  Cocaine use: Cause of cardiomyopathy and possibly of this decompensation, though incomplete medical adherence likely contributing as well.   - Cessation counseling - Avoid beta blockers.   CAD with mild demand ischemia, HLD: Troponin flat, no chest pain.  - No inpatient evaluation anticipated.   - Continue plavix , statin  Peripheral neuropathy: Suspected to be diabetic in etiology, pain worse with edema.  - Continue cymbalta , restart gabapentin , add tylenol  and prn oxycodone .  T2DM: Uncontrolled with hyperglycemia. Last HbA1c 13.9%. - SSI  Normocytic anemia:  - Monitor   Advance Care Planning: Full code  Consults: None  Family Communication: None  Severity of Illness: The appropriate patient status for this patient is INPATIENT. Inpatient status is judged to be reasonable and necessary in order to provide the required intensity of service to ensure the patient's safety. The patient's presenting symptoms, physical exam findings, and initial radiographic and laboratory data in the context of their chronic comorbidities is felt to place them at high risk for further clinical deterioration. Furthermore, it is not anticipated that the patient will be medically stable for discharge from the hospital within 2 midnights of admission.   * I certify that at the point of admission it is my clinical judgment that the patient will  require inpatient hospital care spanning beyond 2 midnights from the point of admission due to high intensity of service, high risk for further deterioration and high frequency of surveillance required.*  Author: Bernardino NOVAK Bryn, MD 02/19/2024 12:05 AM  For on call review [email or web address].

## 2024-02-19 NOTE — Assessment & Plan Note (Signed)
 Continue with mirtazapine   Chronic pain syndrome on hydrocodone , duloxetine  and gabapentin   Add as needed lorazepam  for anxiety, discontinue hydroxyzine  for now

## 2024-02-19 NOTE — Plan of Care (Signed)
  Problem: Clinical Measurements: Goal: Will remain free from infection Outcome: Progressing Goal: Respiratory complications will improve Outcome: Progressing Goal: Cardiovascular complication will be avoided Outcome: Progressing   Problem: Coping: Goal: Level of anxiety will decrease Outcome: Progressing   Problem: Elimination: Goal: Will not experience complications related to bowel motility Outcome: Progressing Goal: Will not experience complications related to urinary retention Outcome: Progressing   Problem: Safety: Goal: Ability to remain free from injury will improve Outcome: Progressing   Problem: Cardiac: Goal: Ability to achieve and maintain adequate cardiopulmonary perfusion will improve Outcome: Progressing   Problem: Skin Integrity: Goal: Risk for impaired skin integrity will decrease Outcome: Progressing

## 2024-02-19 NOTE — Progress Notes (Signed)
 Progress Note   Patient: Luis [name] FMW:[id number] DOB: 1970/11/09 DOA: 02/18/2024     1 DOS: the patient was seen and examined on 02/19/2024   Brief hospital course: [name] was admitted to the hospital with the working diagnosis of heart failure exacerbation.   53 yo male with the past medical history of heart failure, hypertension, dyslipidemia, T2DM, cocaine use, latent TB and depression who presented with lower extremity painful edema. He reported worsening symptoms, having difficulty ambulating.  EMS was called and he was found edematous with 02 saturation 97% on 4 L/min per [place].  On his initial physical examination his blood pressure was 138/80, HR 97, RR 19 and 02 saturation 100% on supplemental 02 per [place] Lungs with bilateral rales with no wheezing, heart with S1 and S2 present and regular with no gallops, abdomen protuberant but not distended, positive lower extremity edema.   Na 138, K 4.1 CL 108 bicarbonate 24 glucose 177, bun 15 cr 0,75  BNP 452  High sensitive troponin 20  Wbc 9,7 hgb 11.4 plt 277   Chest radiograph with cardiomegaly, bilateral hilar vascular congestion, pacemaker defibrillator in place with one right atrial lead and biventricular leads.   EKG 97 bpm, right axis deviation, qtc 530, left bundle branch block, atrial sensing and ventricular pacing with no significant ST segment or T wave changes.      Assessment and Plan: * Acute on chronic systolic CHF (congestive heart failure) (HCC) Echocardiogram with reduced LV systolic function with EF <20%, global hypokinesis, RV systolic function preserved, mild mitral valve regurgitation, normal size left and right atriums.   Continue volume overloaded. Systolic blood pressure 130 mmHg range  Plan to resume diuresis with furosemide  80 mg IV bid Continue entresto  and spironolactone  Digoxin  125 mcg daily and SGLT 2 inh  Hold on B blocker due to risk of low output failure.    Hypertension Continue blood pressure control with entresto  and spironolactone  Diuresis with IV furosemide    GERD (gastroesophageal reflux disease) Continue pantoprazole    Generalized anxiety disorder Continue hydroxyzine  and mirtazapine   Chronic pain syndrome on hydrocodone , duloxetine  and gabapentin    Type 2 diabetes mellitus with hyperlipidemia (HCC) Continue glucose cover and monitoring with insulin  sliding scale Continue with statin   Cocaine use Recently used   Obesity, class 3 Calculated BMI is 43.8  OSA will need Cpap   TB lung, latent Continue with isoniazid  and pyridoxine          Subjective: Patient continue to have dyspnea, edema and back pain, no chest pain,   Physical Exam: Vitals:   02/19/24 0523 02/19/24 0725 02/19/24 1008 02/19/24 1223  BP: 132/83 (!) 149/76  139/87  Pulse: 100 90  100  Resp: 18 16  18   Temp: 97.8 F (36.6 C) 97.8 F (36.6 C)  98.3 F (36.8 C)  TempSrc: Oral Oral  Oral  SpO2: 99% 99% 95% 97%  Weight: 119.5 kg     Height:       Neurology mild somnolence but easy to arouse, follows commands and answers questions, deconditioned and ill looking appearing ENT with mild pallor Cardiovascular with S1 and S2 present and regular with no gallops or rubs, positive systolic murmur at the right lower sternal border Positive mild JVD Respiratory with rales at bases with poor inspiratory effort, anterior auscultation Abdomen protuberant but not distended, soft and non tender Positive lower extremity edema ++, warm to touch   Data Reviewed:    Family Communication: no family at the  bedside   Disposition: Status is: Inpatient Remains inpatient appropriate because: IV diuresis   Planned Discharge Destination: Home    Author: Elidia Toribio Furnace, MD 02/19/2024 3:23 PM  For on call review [email or web address].

## 2024-02-19 NOTE — Assessment & Plan Note (Addendum)
 Echocardiogram with reduced LV systolic function with EF <20%, global hypokinesis, RV systolic function preserved, mild mitral valve regurgitation, normal size left and right atriums.   Urine output is 5,465 ml Systolic blood pressure 130 mmHg range  Plan to resume diuresis with furosemide  80 mg IV bid Continue entresto  and spironolactone  Digoxin  125 mcg daily and SGLT 2 inh  Hold on B blocker due to risk of low output failure.

## 2024-02-19 NOTE — Progress Notes (Signed)
 Pt refused lovenox  SQ. RN educated pt about its benefit. However, pt adamantly refused med. MD notified and aware.   Amado GORMAN Arabia, RN

## 2024-02-19 NOTE — Assessment & Plan Note (Signed)
 Recently used

## 2024-02-19 NOTE — Assessment & Plan Note (Signed)
 Continue with isoniazid  and pyridoxine 

## 2024-02-20 DIAGNOSIS — K219 Gastro-esophageal reflux disease without esophagitis: Secondary | ICD-10-CM | POA: Diagnosis not present

## 2024-02-20 DIAGNOSIS — F411 Generalized anxiety disorder: Secondary | ICD-10-CM | POA: Diagnosis not present

## 2024-02-20 DIAGNOSIS — E878 Other disorders of electrolyte and fluid balance, not elsewhere classified: Secondary | ICD-10-CM

## 2024-02-20 DIAGNOSIS — I10 Essential (primary) hypertension: Secondary | ICD-10-CM | POA: Diagnosis not present

## 2024-02-20 DIAGNOSIS — I5023 Acute on chronic systolic (congestive) heart failure: Secondary | ICD-10-CM | POA: Diagnosis not present

## 2024-02-20 LAB — BASIC METABOLIC PANEL WITH GFR
Anion gap: 11 (ref 5–15)
BUN: 12 mg/dL (ref 6–20)
CO2: 28 mmol/L (ref 22–32)
Calcium: 9.4 mg/dL (ref 8.9–10.3)
Chloride: 97 mmol/L — ABNORMAL LOW (ref 98–111)
Creatinine, Ser: 0.96 mg/dL (ref 0.61–1.24)
GFR, Estimated: >60 mL/min (ref >60)
Glucose, Bld: 207 mg/dL — ABNORMAL HIGH (ref 70–99)
Potassium: 3.9 mmol/L (ref 3.5–5.1)
Sodium: 136 mmol/L (ref 135–145)

## 2024-02-20 LAB — GLUCOSE, CAPILLARY
Glucose-Capillary: 226 mg/dL — ABNORMAL HIGH (ref 70–99)
Glucose-Capillary: 164 mg/dL — ABNORMAL HIGH (ref 70–99)
Glucose-Capillary: 319 mg/dL — ABNORMAL HIGH (ref 70–99)
Glucose-Capillary: 264 mg/dL — ABNORMAL HIGH (ref 70–99)

## 2024-02-20 LAB — MAGNESIUM: Magnesium: 1.8 mg/dL (ref 1.7–2.4)

## 2024-02-20 MED ORDER — PREDNISOLONE ACETATE 1 % OP SUSP
1.0000 [drp] | Freq: Four times a day (QID) | OPHTHALMIC | Status: DC
  Administered 2024-02-20 – 2024-02-25 (×20): 1 [drp] via OPHTHALMIC
  Filled 2024-02-20: qty 5

## 2024-02-20 MED ORDER — POTASSIUM CHLORIDE CRYS ER 20 MEQ PO TBCR
40.0000 meq | EXTENDED_RELEASE_TABLET | Freq: Once | ORAL | Status: AC
  Administered 2024-02-20: 40 meq via ORAL
  Filled 2024-02-20: qty 2

## 2024-02-20 MED ORDER — MAGNESIUM SULFATE 2 GM/50ML IV SOLN
2.0000 g | Freq: Once | INTRAVENOUS | Status: AC
  Administered 2024-02-20: 2 g via INTRAVENOUS
  Filled 2024-02-20: qty 50

## 2024-02-20 MED ORDER — FUROSEMIDE 10 MG/ML IJ SOLN
80.0000 mg | Freq: Two times a day (BID) | INTRAMUSCULAR | Status: DC
  Administered 2024-02-21: 80 mg via INTRAVENOUS

## 2024-02-20 MED ORDER — ATROPINE SULFATE 1 % OP SOLN
1.0000 [drp] | Freq: Two times a day (BID) | OPHTHALMIC | Status: DC
  Administered 2024-02-20 – 2024-02-25 (×11): 1 [drp] via OPHTHALMIC
  Filled 2024-02-20: qty 2

## 2024-02-20 NOTE — Progress Notes (Signed)
 Patient became combative and verbally abusive.  Refused lasix  IV push and patient educated.  CHARM Apo MD paged.

## 2024-02-20 NOTE — Assessment & Plan Note (Signed)
 Today renal function with serum cr at 1,28 with K at 4,2 and serum bicarbonate at 27  Na 136 and Mg 2.2   Hold PM dose of furosemide   Continue diuresis and follow up renal function and electrolytes in am.

## 2024-02-20 NOTE — Progress Notes (Signed)
 Pt refused lovenox  shot. MD notified.   Amado GORMAN Arabia, RN

## 2024-02-20 NOTE — Progress Notes (Signed)
   02/20/24 1955  BiPAP/CPAP/SIPAP  $ Non-Invasive Ventilator  Non-Invasive Vent Subsequent  BiPAP/CPAP/SIPAP Pt Type Adult  BiPAP/CPAP/SIPAP Resmed  Mask Type Full face mask  Mask Size Medium  PEEP 5 cmH20  Patient Home Mask No  Patient Home Tubing No  Auto Titrate No  CPAP/SIPAP surface wiped down Yes  Device Plugged into RED Power Outlet Yes  BiPAP/CPAP /SiPAP Vitals  Pulse Rate (!) 106  Resp 18  SpO2 94 %  Bilateral Breath Sounds Diminished

## 2024-02-20 NOTE — Progress Notes (Signed)
 CSW acknowledges patient's consult awaiting PT/OT for possible SNF workup.  [place] Glenda Spelman LCSW-A   02/20/2024 2:52 PM

## 2024-02-20 NOTE — Progress Notes (Signed)
 Progress Note   Patient: Luis [name] FMW:[id number] DOB: 10/14/1970 DOA: 02/18/2024     2 DOS: the patient was seen and examined on 02/20/2024   Brief hospital course: [name] was admitted to the hospital with the working diagnosis of heart failure exacerbation.   53 yo male with the past medical history of heart failure, hypertension, dyslipidemia, T2DM, cocaine use, latent TB and depression who presented with lower extremity painful edema. He reported worsening symptoms, having difficulty ambulating.  EMS was called and he was found edematous with 02 saturation 97% on 4 L/min per [place].  On his initial physical examination his blood pressure was 138/80, HR 97, RR 19 and 02 saturation 100% on supplemental 02 per [place] Lungs with bilateral rales with no wheezing, heart with S1 and S2 present and regular with no gallops, abdomen protuberant but not distended, positive lower extremity edema.   Na 138, K 4.1 CL 108 bicarbonate 24 glucose 177, bun 15 cr 0,75  BNP 452  High sensitive troponin 20  Wbc 9,7 hgb 11.4 plt 277   Chest radiograph with cardiomegaly, bilateral hilar vascular congestion, pacemaker defibrillator in place with one right atrial lead and biventricular leads.   EKG 97 bpm, right axis deviation, qtc 530, left bundle branch block, atrial sensing and ventricular pacing with no significant ST segment or T wave changes.   Patient was placed on IV furosemide  for diuresis.    Assessment and Plan: * Acute on chronic systolic CHF (congestive heart failure) (HCC) Echocardiogram with reduced LV systolic function with EF <20%, global hypokinesis, RV systolic function preserved, mild mitral valve regurgitation, normal size left and right atriums.   Urine output is 5,465 ml Systolic blood pressure 130 mmHg range  Plan to resume diuresis with furosemide  80 mg IV bid Continue entresto  and spironolactone  Digoxin  125 mcg daily and SGLT 2 inh  Hold on B blocker due to risk  of low output failure.   Hypertension Continue blood pressure control with entresto  and spironolactone  Diuresis with IV furosemide    GERD (gastroesophageal reflux disease) Continue pantoprazole    Generalized anxiety disorder Continue hydroxyzine  and mirtazapine   Chronic pain syndrome on hydrocodone , duloxetine  and gabapentin    Type 2 diabetes mellitus with hyperlipidemia (HCC) Continue glucose cover and monitoring with insulin  sliding scale Continue with statin   Cocaine use Recently used   Obesity, class 3 Calculated BMI is 43.8  OSA will need Cpap   Hypochloremia Renal function with serum cr at 0,96 with K at 3,9 and serum bicarbonate at 28  Na 136 and Mg 1.8   Will add 2 g mag sulfate and 40 meq KCl to keep K above 4 and Mg above 2 Continue diuresis and follow up renal function and electrolytes in am.   TB lung, latent Continue with isoniazid  and pyridoxine          Subjective: Patient is having anxiety and right eye pain, no nausea or vomiting, edema and dyspnea are improving   Physical Exam: Vitals:   02/20/24 0000 02/20/24 0320 02/20/24 0600 02/20/24 0605  BP:    123/76  Pulse:      Resp:      Temp: 98 F (36.7 C) 98 F (36.7 C)    TempSrc: Axillary Oral    SpO2:      Weight:   113.8 kg   Height:       Neurology awake and alert, deconditioned and mildly agitated ENT with mild pallor with no icterus Cardiovascular with S1 and  S2 present, regular with no gallops or rubs, positive systolic murmur at the apex No JVD Respiratory with mild rales at bases with no wheezing or rhonchi  Abdomen protuberant, not distended and non tender Lower extremity with no edema  Data Reviewed:    Family Communication: no family at the bedside   Disposition: Status is: Inpatient Remains inpatient appropriate because: recovering from heart failure   Planned Discharge Destination: Home    Author: Elidia Toribio Furnace, MD 02/20/2024 8:26 AM  For on call  review [email or web address].

## 2024-02-21 DIAGNOSIS — I10 Essential (primary) hypertension: Secondary | ICD-10-CM | POA: Diagnosis not present

## 2024-02-21 DIAGNOSIS — I5023 Acute on chronic systolic (congestive) heart failure: Secondary | ICD-10-CM | POA: Diagnosis not present

## 2024-02-21 DIAGNOSIS — F411 Generalized anxiety disorder: Secondary | ICD-10-CM | POA: Diagnosis not present

## 2024-02-21 DIAGNOSIS — K219 Gastro-esophageal reflux disease without esophagitis: Secondary | ICD-10-CM | POA: Diagnosis not present

## 2024-02-21 LAB — GLUCOSE, CAPILLARY
Glucose-Capillary: 304 mg/dL — ABNORMAL HIGH (ref 70–99)
Glucose-Capillary: 245 mg/dL — ABNORMAL HIGH (ref 70–99)
Glucose-Capillary: 202 mg/dL — ABNORMAL HIGH (ref 70–99)
Glucose-Capillary: 224 mg/dL — ABNORMAL HIGH (ref 70–99)

## 2024-02-21 LAB — BASIC METABOLIC PANEL WITH GFR
Anion gap: 9 (ref 5–15)
BUN: 22 mg/dL — ABNORMAL HIGH (ref 6–20)
CO2: 27 mmol/L (ref 22–32)
Calcium: 9.1 mg/dL (ref 8.9–10.3)
Chloride: 100 mmol/L (ref 98–111)
Creatinine, Ser: 1.28 mg/dL — ABNORMAL HIGH (ref 0.61–1.24)
GFR, Estimated: >60 mL/min (ref >60)
Glucose, Bld: 249 mg/dL — ABNORMAL HIGH (ref 70–99)
Potassium: 4.2 mmol/L (ref 3.5–5.1)
Sodium: 136 mmol/L (ref 135–145)

## 2024-02-21 LAB — MAGNESIUM: Magnesium: 2.2 mg/dL (ref 1.7–2.4)

## 2024-02-21 MED ORDER — LORAZEPAM 1 MG PO TABS
1.0000 mg | ORAL_TABLET | ORAL | Status: DC | PRN
  Administered 2024-02-21 – 2024-02-24 (×4): 1 mg via ORAL
  Filled 2024-02-21 (×4): qty 1

## 2024-02-21 NOTE — Progress Notes (Signed)
 Mobility Specialist Progress Note:    02/21/24 1547  Mobility  Activity Dangled on edge of bed;Ambulated with assistance;Stood at bedside  Level of Assistance Contact guard assist, steadying assist  Assistive Device Front wheel walker  Distance Ambulated (ft) 10 ft  Activity Response Tolerated fair  Mobility Referral Yes  Mobility visit 1 Mobility  Mobility Specialist Start Time (ACUTE ONLY) 1547  Mobility Specialist Stop Time (ACUTE ONLY) 1552  Mobility Specialist Time Calculation (min) (ACUTE ONLY) 5 min   Pt reluctant but tried to participate. Very depressed and PTSD (RN notified). C/o of BLE foot pain but medication does help. Pt ambulated short distance before wanting to return to bed. Left in bed w/ all needs met.   Venetia Keel Mobility Specialist Please Neurosurgeon or Rehab Office at [phone]

## 2024-02-21 NOTE — TOC Initial Note (Signed)
 Transition of Care [facility]) - Initial/Assessment Note    Patient Details  Name: Luis [name] MRN: [id number] Date of Birth: 07/20/1970  Transition of Care [facility]) CM/SW Contact:    Luis Barnie Rama, RN Phone Number: 02/21/2024, 4:36 PM  Clinical Narrative:                 From home alone, has PCP and insurance on file, states has an aide with [facility] from 9 am to 11:30 and 12 - 3 pm and he had HHRN with [facility] for medication management and would like to continue with them,  NCM confirmed with Luis [name] , they had canceled because could not get in touch with patient and was waiting for a call back, but they can see patient for medication management.   Has oxygen 3 liters, shower chair, scooter, rollator, walker  at home.  States he will need cab transport at dc.  States gets medications from [facility].  Pta self ambulatory with walker.    If appropriate will need HHRN order for Med management.   Expected Discharge Plan: Home w Home Health Services Barriers to Discharge: Continued Medical Work up   Patient Goals and CMS Choice Patient states their goals for this hospitalization and ongoing recovery are:: return home   Choice offered to / list presented to : NA      Expected Discharge Plan and Services In-house Referral: NA Discharge Planning Services: CM Consult Post Acute Care Choice: Home Health Living arrangements for the past 2 months: Single Family Home                 DME Arranged: N/A DME Agency: NA       HH Arranged: NA          Prior Living Arrangements/Services Living arrangements for the past 2 months: Single Family Home Lives with:: Self Patient language and need for interpreter reviewed:: Yes Do you feel safe going back to the place where you live?: Yes      Need for Family Participation in Patient Care: No (Comment) Care giver support system in place?: No (comment) Current home services: DME, Homehealth aide (oxygen 3 liters, shower chair, scooter,  rollator, walker,) Criminal Activity/Legal Involvement Pertinent to Current Situation/Hospitalization: No - Comment as needed  Activities of Daily Living   ADL Screening (condition at time of admission) Is the patient deaf or have difficulty hearing?: No Does the patient have difficulty seeing, even when wearing glasses/contacts?: Yes (patient is blind) Does the patient have difficulty concentrating, remembering, or making decisions?: No  Permission Sought/Granted Permission sought to share information with : Case Manager Permission granted to share information with : Yes, Verbal Permission Granted     Permission granted to share info w AGENCY: HH        Emotional Assessment Appearance:: Appears stated age Attitude/Demeanor/Rapport: Engaged Affect (typically observed): Appropriate Orientation: : Oriented to Self, Oriented to Place, Oriented to  Time, Oriented to Situation   Psych Involvement: No (comment)  Admission diagnosis:  Acute on chronic systolic congestive heart failure (HCC) [I50.23] Acute on chronic HFrEF (heart failure with reduced ejection fraction) (HCC) [I50.23] Acute on chronic hypoxic respiratory failure (HCC) [J96.21] Patient Active Problem List   Diagnosis Date Noted   Hypochloremia 02/20/2024   Acute on chronic hypoxic respiratory failure (HCC) 02/19/2024   TB lung, latent 02/19/2024   Acute on chronic systolic CHF (congestive heart failure) (HCC) 02/18/2024   Malnutrition of moderate degree 01/05/2024   Aspiration pneumonia due to gastric  secretions (HCC) 01/05/2024   Respiratory failure with hypoxia (HCC) 01/04/2024   Flash pulmonary edema (HCC) 01/04/2024   Toxic metabolic encephalopathy 01/04/2024   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   Conversion reaction 06/15/2023   History of stroke 12/05/2022   MDD (major depressive disorder), 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/04/2022   Cocaine use 12/04/2022   Weakness 10/25/2022   HFrEF (heart failure with reduced  ejection fraction) (HCC) 04/16/2022   Acute heart failure with reduced ejection fraction (HFrEF, <= 40%) (HCC) 04/16/2022   Fall 03/24/2022   Biventricular ICD (implantable cardioverter-defibrillator) in place    Lightheadedness    Acute on chronic combined systolic and diastolic CHF (congestive heart failure) (HCC) 02/26/2022   Cocaine abuse (HCC) 02/26/2022   Chronic respiratory failure with hypoxia (HCC) - 2.5 L/min 02/26/2022   OSA (obstructive sleep apnea) 08/07/2021   MDD (major depressive disorder), recurrent severe, 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   Moderate episode of recurrent major depressive disorder (HCC) 05/08/2021   Generalized anxiety disorder 05/08/2021   Unspecified mood (affective) disorder (HCC) 05/08/2021   Insomnia 05/08/2021   Cocaine use disorder, moderate, dependence (HCC) 04/02/2021   Right arm weakness 01/28/2021   Right sided weakness 01/28/2021   Non-ischemic cardiomyopathy (HCC)    Acute respiratory failure with hypoxia and hypercapnia (HCC) 05/05/2019   Obesity, class 3 05/05/2019   Chronic systolic CHF (congestive heart failure) (HCC) 01/17/2019   HLD (hyperlipidemia)    Type 2 diabetes mellitus with hyperlipidemia (HCC)    Hypertension    GERD (gastroesophageal reflux disease)    Tobacco abuse    PCP:  Clinic, [name] Pharmacy:   [id number] [place], [place] - [address] AT [facility] [address] [facility] [address] [place] [address] Phone: [phone] Fax: [phone]  [name] [address] [place] [address] Phone: [phone] Fax: [phone]     Social Drivers of Health (SDOH) Social History: SDOH Screenings   Food Insecurity: Patient Declined (02/19/2024)  Housing: Unknown (02/19/2024)  Transportation Needs: Patient Declined (02/19/2024)  Utilities: Patient Declined (02/19/2024)  Alcohol  Screen: Low Risk  (12/04/2022)  Depression (PHQ2-9): Medium Risk  (11/16/2022)  Financial Resource Strain: High Risk (11/16/2022)  Social Connections: Unknown (08/26/2022)   Received from [facility]  Stress: No Stress Concern Present (04/25/2023)   Received from [facility]  Tobacco Use: High Risk (02/19/2024)   SDOH Interventions:     Readmission Risk Interventions    02/21/2024    4:31 PM 01/10/2024   11:50 AM  Readmission Risk Prevention Plan  Transportation Screening Complete Complete  Medication Review Oceanographer) Complete Complete  PCP or Specialist appointment within 3-5 days of discharge  Complete  HRI or Home Care Consult Complete Complete  Palliative Care Screening Not Applicable Not Applicable  Skilled Nursing Facility Not Applicable Not Applicable

## 2024-02-21 NOTE — Progress Notes (Addendum)
 Progress Note   Patient: Luis [name] FMW:[id number] DOB: 07-18-1970 DOA: 02/18/2024     3 DOS: the patient was seen and examined on 02/21/2024   Brief hospital course: [name] was admitted to the hospital with the working diagnosis of heart failure exacerbation.   53 yo male with the past medical history of heart failure, hypertension, dyslipidemia, T2DM, cocaine use, latent TB and depression who presented with lower extremity painful edema. He reported worsening symptoms, having difficulty ambulating.  EMS was called and he was found edematous with 02 saturation 97% on 4 L/min per [place].  On his initial physical examination his blood pressure was 138/80, HR 97, RR 19 and 02 saturation 100% on supplemental 02 per [place] Lungs with bilateral rales with no wheezing, heart with S1 and S2 present and regular with no gallops, abdomen protuberant but not distended, positive lower extremity edema.   Na 138, K 4.1 CL 108 bicarbonate 24 glucose 177, bun 15 cr 0,75  BNP 452  High sensitive troponin 20  Wbc 9,7 hgb 11.4 plt 277   Chest radiograph with cardiomegaly, bilateral hilar vascular congestion, pacemaker defibrillator in place with one right atrial lead and biventricular leads.   EKG 97 bpm, right axis deviation, qtc 530, left bundle branch block, atrial sensing and ventricular pacing with no significant ST segment or T wave changes.   Patient was placed on IV furosemide  for diuresis.   Assessment and Plan: * Acute on chronic systolic CHF (congestive heart failure) (HCC) Echocardiogram with reduced LV systolic function with EF <20%, global hypokinesis, RV systolic function preserved, mild mitral valve regurgitation, normal size left and right atriums.   Urine output is 3,300 ml Systolic blood pressure 130 mmHg range  Patient had 80 mg IV furosemide  this am, will hold on pm dose.  Continue entresto  and spironolactone  Digoxin  125 mcg daily and SGLT 2 inh  Hold on B blocker due  to risk of low output failure.   Hypertension Continue blood pressure control with entresto  and spironolactone   GERD (gastroesophageal reflux disease) Continue pantoprazole    Generalized anxiety disorder Continue with mirtazapine   Chronic pain syndrome on hydrocodone , duloxetine  and gabapentin   Add as needed lorazepam  for anxiety, discontinue hydroxyzine  for now    Type 2 diabetes mellitus with hyperlipidemia (HCC) Uncontrolled with hyperglycemia, Hgb A1c 13.9  Continue glucose cover and monitoring with insulin  sliding scale Continue with statin   Cocaine use Recently used   Obesity, class 3 Calculated BMI is 43.8  OSA will need Cpap   Hypochloremia Today renal function with serum cr at 1,28 with K at 4,2 and serum bicarbonate at 27  Na 136 and Mg 2.2   Hold PM dose of furosemide   Continue diuresis and follow up renal function and electrolytes in am.   TB lung, latent Continue with isoniazid  and pyridoxine       Subjective: patient is feeling weak and deconditioned, his right eye pain has improved. No chest pain, dyspnea is improving   Physical Exam: Vitals:   02/20/24 1951 02/20/24 1955 02/20/24 2335 02/21/24 1157  BP: (!) 141/82  94/82 133/64  Pulse: 100 (!) 106 (!) 112 (!) 105  Resp: 18 18 18 20   Temp: 97.9 F (36.6 C)  99.3 F (37.4 C) 98.8 F (37.1 C)  TempSrc: Oral  Oral Oral  SpO2: 94% 94% 91% 98%  Weight:      Height:       Neurology awake and alert, deconditioned  ENT with mild pallor  Cardiovascular with S1 and S2 present and regular with no gallops or rubs, positive systolic murmur at the apex No JVD Respiratory with no rales or wheezing, no rhonchi  Abdomen with no distention, protuberant but not tender No lower extremity edema   Data Reviewed:   Family Communication: no family at the bedside  Disposition: Status is: Inpatient Remains inpatient appropriate because: recovering from heart failure   Planned Discharge Destination:  Home    Author: Elidia Toribio Furnace, MD 02/21/2024 2:10 PM  For on call review [email or web address].

## 2024-02-21 NOTE — Inpatient Diabetes Management (Signed)
 Inpatient Diabetes Program Recommendations  AACE/ADA: New Consensus Statement on Inpatient Glycemic Control (2015)  Target Ranges:  Prepandial:   less than 140 mg/dL      Peak postprandial:   less than 180 mg/dL (1-2 hours)      Critically ill patients:  140 - 180 mg/dL   Lab Results  Component Value Date   GLUCAP 245 (H) 02/21/2024   HGBA1C 13.9 (H) 01/04/2024    Review of Glycemic Control  Diabetes history: DM2 Outpatient Diabetes medications: Farxiga  10 mg daily, Novolog  25 units TID Current orders for Inpatient glycemic control: Farxiga  10 mg daily, Novolog  0-15 TID with meals and 0-5 HS  HgbA1C - 13.9%  Inpatient Diabetes Program Recommendations:    Consider adding Semglee  15 units Q24H  Spoke with pt at bedside regarding his diabetes and HgbA1C of 13.9%. Pt states he doesn't take insulin  daily and sometimes goes a couple days without it, d/t drugs. (Note + for cocaine) Discussed glucose and HgbA1C goals. Explained how hyperglycemia leads to damage within blood vessels which lead to the common complications seen with uncontrolled diabetes. Stressed to the patient the importance of improving glycemic control to prevent further complications from uncontrolled diabetes. Discussed impact of nutrition, exercise, stress, sickness, and medications on diabetes control.  Denies hypoglycemia. Reviewed hypo s/s and treatment. Has not been monitoring blood sugars. Has meter at home. [facility].   Will continue to follow while inpatient.   Thank you. Shona Brandy, RD, LDN, CDCES Inpatient Diabetes Coordinator [phone]

## 2024-02-21 NOTE — Progress Notes (Signed)
 Heart Failure Navigator Progress Note  Assessed for Heart & Vascular TOC clinic readiness.  Patient does not meet criteria due to Advanced Heart Failure Team patient of Dr. Shirlee Latch.   Navigator will sign off at this time.    Rhae Hammock, BSN, Scientist, clinical (histocompatibility and immunogenetics) Only

## 2024-02-21 NOTE — Progress Notes (Signed)
   02/21/24 2036  BiPAP/CPAP/SIPAP  BiPAP/CPAP/SIPAP Pt Type Adult  BiPAP/CPAP/SIPAP Resmed  Mask Type Full face mask  IPAP 15 cmH20  EPAP 5 cmH2O  Flow Rate 3 lpm  Patient Home Machine No  Patient Home Mask No  Patient Home Tubing No  Auto Titrate Yes  Minimum cmH2O 5 cmH2O  Maximum cmH2O 15 cmH2O  BiPAP/CPAP /SiPAP Vitals  Bilateral Breath Sounds Clear;Diminished

## 2024-02-21 NOTE — Plan of Care (Signed)

## 2024-02-21 NOTE — Progress Notes (Signed)
 Patient refused to let staff retrieve vital signs this morning.  He states that he has PTSD and does not want to be bothered.

## 2024-02-22 ENCOUNTER — Other Ambulatory Visit (HOSPITAL_COMMUNITY): Payer: Self-pay

## 2024-02-22 DIAGNOSIS — F411 Generalized anxiety disorder: Secondary | ICD-10-CM | POA: Diagnosis not present

## 2024-02-22 DIAGNOSIS — I5023 Acute on chronic systolic (congestive) heart failure: Secondary | ICD-10-CM | POA: Diagnosis not present

## 2024-02-22 DIAGNOSIS — I10 Essential (primary) hypertension: Secondary | ICD-10-CM | POA: Diagnosis not present

## 2024-02-22 DIAGNOSIS — K219 Gastro-esophageal reflux disease without esophagitis: Secondary | ICD-10-CM | POA: Diagnosis not present

## 2024-02-22 LAB — GLUCOSE, CAPILLARY
Glucose-Capillary: 259 mg/dL — ABNORMAL HIGH (ref 70–99)
Glucose-Capillary: 198 mg/dL — ABNORMAL HIGH (ref 70–99)
Glucose-Capillary: 197 mg/dL — ABNORMAL HIGH (ref 70–99)
Glucose-Capillary: 296 mg/dL — ABNORMAL HIGH (ref 70–99)

## 2024-02-22 LAB — BASIC METABOLIC PANEL WITH GFR
Anion gap: 13 (ref 5–15)
BUN: 24 mg/dL — ABNORMAL HIGH (ref 6–20)
CO2: 27 mmol/L (ref 22–32)
Calcium: 9.2 mg/dL (ref 8.9–10.3)
Chloride: 94 mmol/L — ABNORMAL LOW (ref 98–111)
Creatinine, Ser: 1.09 mg/dL (ref 0.61–1.24)
GFR, Estimated: >60 mL/min (ref >60)
Glucose, Bld: 239 mg/dL — ABNORMAL HIGH (ref 70–99)
Potassium: 3.9 mmol/L (ref 3.5–5.1)
Sodium: 134 mmol/L — ABNORMAL LOW (ref 135–145)

## 2024-02-22 LAB — MAGNESIUM: Magnesium: 2.2 mg/dL (ref 1.7–2.4)

## 2024-02-22 MED ORDER — MIRTAZAPINE 15 MG PO TABS
7.5000 mg | ORAL_TABLET | Freq: Every evening | ORAL | 0 refills | Status: AC | PRN
  Filled 2024-02-22: qty 30, 60d supply, fill #0

## 2024-02-22 MED ORDER — PREDNISOLONE ACETATE 1 % OP SUSP: 1.0000 [drp] | Freq: Four times a day (QID) | OPHTHALMIC | Status: AC

## 2024-02-22 MED ORDER — DAPAGLIFLOZIN PROPANEDIOL 10 MG PO TABS
10.0000 mg | ORAL_TABLET | Freq: Every day | ORAL | 0 refills | Status: DC
  Filled 2024-02-22: qty 30, 30d supply, fill #0

## 2024-02-22 MED ORDER — DIGOXIN 125 MCG PO TABS
125.0000 ug | ORAL_TABLET | Freq: Every day | ORAL | 0 refills | Status: AC
  Filled 2024-02-22: qty 30, 30d supply, fill #0

## 2024-02-22 MED ORDER — HYDROXYZINE PAMOATE 25 MG PO CAPS
25.0000 mg | ORAL_CAPSULE | Freq: Three times a day (TID) | ORAL | 0 refills | Status: AC | PRN
  Filled 2024-02-22: qty 30, 10d supply, fill #0

## 2024-02-22 MED ORDER — SACUBITRIL-VALSARTAN 24-26 MG PO TABS
1.0000 | ORAL_TABLET | Freq: Two times a day (BID) | ORAL | 0 refills | Status: DC
Start: 2024-02-22 — End: 2024-02-25

## 2024-02-22 MED ORDER — ATROPINE SULFATE 1 % OP SOLN: 1.0000 [drp] | Freq: Two times a day (BID) | OPHTHALMIC | Status: AC

## 2024-02-22 MED ORDER — DULOXETINE HCL 30 MG PO CPEP
30.0000 mg | ORAL_CAPSULE | Freq: Every day | ORAL | 0 refills | Status: AC
  Filled 2024-02-22: qty 30, 30d supply, fill #0

## 2024-02-22 MED ORDER — BUMETANIDE 2 MG PO TABS
2.0000 mg | ORAL_TABLET | Freq: Two times a day (BID) | ORAL | Status: DC
  Administered 2024-02-23 – 2024-02-25 (×5): 2 mg via ORAL
  Filled 2024-02-22 (×5): qty 1

## 2024-02-22 MED ORDER — ROSUVASTATIN CALCIUM 40 MG PO TABS
20.0000 mg | ORAL_TABLET | Freq: Every day | ORAL | 0 refills | Status: AC
  Filled 2024-02-22: qty 30, 60d supply, fill #0

## 2024-02-22 MED ORDER — GABAPENTIN 300 MG PO CAPS
300.0000 mg | ORAL_CAPSULE | Freq: Two times a day (BID) | ORAL | 0 refills | Status: DC
  Filled 2024-02-22: qty 60, 30d supply, fill #0

## 2024-02-22 MED ORDER — SPIRONOLACTONE 25 MG PO TABS
25.0000 mg | ORAL_TABLET | Freq: Every day | ORAL | 0 refills | Status: AC
  Filled 2024-02-22: qty 30, 30d supply, fill #0

## 2024-02-22 MED ORDER — BUMETANIDE 2 MG PO TABS
2.0000 mg | ORAL_TABLET | Freq: Two times a day (BID) | ORAL | 0 refills | Status: DC
  Filled 2024-02-22: qty 60, 30d supply, fill #0

## 2024-02-22 NOTE — Plan of Care (Signed)
°  Problem: Clinical Measurements: °Goal: Will remain free from infection °Outcome: Progressing °Goal: Respiratory complications will improve °Outcome: Progressing °  °Problem: Elimination: °Goal: Will not experience complications related to urinary retention °Outcome: Progressing °  °Problem: Safety: °Goal: Ability to remain free from injury will improve °Outcome: Progressing °  °

## 2024-02-22 NOTE — Discharge Summary (Signed)
 Physician Discharge Summary   Patient: Luis [name] MRN: [id number] DOB: 1970-12-07  Admit date:     02/18/2024  Discharge date: 02/22/24  Discharge Physician: Elidia Toribio Furnace   PCP: [facility]   Recommendations at discharge:    Patient was placed on guideline directed therapy with entresto , SGLT 2 ing and spironolactone . Hold on B blocker until outpatient follow up assessment, note that patient uses cocaine  Resume oral diuretic therapy with bumetanide  2 mg bid Added gabapentin  for better back pain control.  Medications have been refilled.  Follow up renal function and electrolytes in 7 days  Follow up with [facility] clinic in 7 to 10 days Follow up with Cardiology as scheduled   Discharge Diagnoses: Principal Problem:   Acute on chronic systolic CHF (congestive heart failure) (HCC) Active Problems:   Hypertension   GERD (gastroesophageal reflux disease)   Type 2 diabetes mellitus with hyperlipidemia (HCC)   Generalized anxiety disorder   Cocaine use   Obesity, class 3   Hypochloremia   TB lung, latent  Resolved Problems:   * No resolved hospital problems. [place] [facility] Course: [name] was admitted to the hospital with the working diagnosis of heart failure exacerbation.   53 yo male with the past medical history of heart failure, hypertension, dyslipidemia, T2DM, cocaine use, latent TB and depression who presented with lower extremity painful edema. He reported worsening symptoms, having difficulty ambulating.  EMS was called and he was found edematous with 02 saturation 97% on 4 L/min per [place].  On his initial physical examination his blood pressure was 138/80, HR 97, RR 19 and 02 saturation 100% on supplemental 02 per [place] Lungs with bilateral rales with no wheezing, heart with S1 and S2 present and regular with no gallops, abdomen protuberant but not distended, positive lower extremity edema.   Na 138, K 4.1 CL 108 bicarbonate 24  glucose 177, bun 15 cr 0,75  BNP 452  High sensitive troponin 20  Wbc 9,7 hgb 11.4 plt 277   Chest radiograph with cardiomegaly, bilateral hilar vascular congestion, pacemaker defibrillator in place with one right atrial lead and biventricular leads.   EKG 97 bpm, right axis deviation, qtc 530, left bundle branch block, atrial sensing and ventricular pacing with no significant ST segment or T wave changes.   Patient was placed on IV furosemide  for diuresis with improvement in his symptoms. Patient was advised to stay adherent to his medical therapy to avoid re-hospitalizations.   Assessment and Plan: * Acute on chronic systolic CHF (congestive heart failure) (HCC) Echocardiogram with reduced LV systolic function with EF <20%, global hypokinesis, RV systolic function preserved, mild mitral valve regurgitation, normal size left and right atriums.   Patient was placed on IV furosemide  for diuresis, negative fluid balance was achieved, - 10,910 ml, with significant improvement in his symptoms.   Continue entresto  and spironolactone  Digoxin  125 mcg daily and SGLT 2 inh  Hold on B blocker due to risk of low output failure.  Resume bumetanide  2 mg daily for diuresis   Hypertension Continue blood pressure control with entresto  and spironolactone   GERD (gastroesophageal reflux disease) Continue pantoprazole    Generalized anxiety disorder Continue with mirtazapine   Chronic pain syndrome on hydrocodone , duloxetine  and gabapentin   He had as needed lorazepam  for anxiety, during his hospitalization.   Type 2 diabetes mellitus with hyperlipidemia (HCC) Uncontrolled with hyperglycemia, Hgb A1c 13.9  Patient was placed on insulin  sliding scale for glucose cover and monitoring  Continue with statin   Cocaine use Recently used   Obesity, class 3 Calculated BMI is 43.8  OSA will need Cpap   Hypochloremia Hyponatremia  Renal function has remained stable with serum cr at 1,0 with K at 3,9  and serum bicarbonate at 27  Na 134 and Mg 2.2   Resume oral bumetanide  for diuresis.  Follow up renal function and electrolytes as outpatient.   TB lung, latent Continue with isoniazid  and pyridoxine        Consultants: none  Procedures performed: none   Disposition: Home Diet recommendation:  Cardiac and Carb modified diet DISCHARGE MEDICATION: Allergies as of 02/22/2024       Reactions   Aspirin Anaphylaxis, Other (See Comments)   Throat closing   Iodine-131 Hives   Peanut (diagnostic) Anaphylaxis   Throat closes   Iodinated Contrast Media Hives   Can be premedicated    Latex Hives, Rash   Penicillins Nausea And Vomiting   Ultram  [tramadol ] Hives, Nausea Only   Zestril [lisinopril] Cough   Cinnamon    Amoxicillin-pot Clavulanate Diarrhea   Lidocaine  Rash   Rash from adhesive on lidocaine  patches         Medication List     STOP taking these medications    BD Pen Needle Nano U/F 32G X 4 MM Misc Generic drug: Insulin  Pen Needle   carvedilol  12.5 MG tablet Commonly known as: COREG    cholecalciferol 25 MCG (1000 UNIT) tablet Commonly known as: VITAMIN D3   clopidogrel  75 MG tablet Commonly known as: PLAVIX    diclofenac  Sodium 1 % Gel Commonly known as: VOLTAREN    furosemide  80 MG tablet Commonly known as: LASIX    loratadine  10 MG tablet Commonly known as: CLARITIN    metoprolol  succinate 25 MG 24 hr tablet Commonly known as: TOPROL -XL   multivitamin tablet   NovoLOG  FlexPen 100 UNIT/ML FlexPen Generic drug: insulin  aspart   pantoprazole  40 MG tablet Commonly known as: PROTONIX    Potassium Chloride  ER 20 MEQ Tbcr   prazosin  2 MG capsule Commonly known as: MINIPRESS    thiamine  100 MG tablet Commonly known as: VITAMIN B1   traZODone  150 MG tablet Commonly known as: DESYREL        TAKE these medications    atropine  1 % ophthalmic solution Place 1 drop into the right eye 2 (two) times daily.   bumetanide  2 MG tablet Commonly known  as: BUMEX  Take 1 tablet (2 mg total) by mouth 2 (two) times daily.   dapagliflozin  propanediol 10 MG Tabs tablet Commonly known as: Farxiga  Take 1 tablet (10 mg total) by mouth daily.   digoxin  0.125 MG tablet Commonly known as: LANOXIN  Take 1 tablet (125 mcg total) by mouth daily.   DULoxetine  30 MG capsule Commonly known as: CYMBALTA  Take 1 capsule (30 mg total) by mouth daily.   gabapentin  300 MG capsule Commonly known as: NEURONTIN  Take 1 capsule (300 mg total) by mouth 2 (two) times daily.   hydrOXYzine  25 MG capsule Commonly known as: VISTARIL  Take 1 capsule (25 mg total) by mouth 3 (three) times daily as needed for anxiety.   isoniazid  300 MG tablet Commonly known as: NYDRAZID  Take 300 mg by mouth in the morning.   mirtazapine  15 MG tablet Commonly known as: REMERON  Take 0.5 tablets (7.5 mg total) by mouth at bedtime as needed (sleep).   prednisoLONE  acetate 1 % ophthalmic suspension Commonly known as: PRED FORTE  Place 1 drop into the right eye 4 (four) times daily.   pyridOXINE   50 MG tablet Commonly known as: B-6 Take 50 mg by mouth daily.   rosuvastatin  40 MG tablet Commonly known as: CRESTOR  Take 0.5 tablets (20 mg total) by mouth daily.   sacubitril -valsartan  24-26 MG Commonly known as: Entresto  Take 1 tablet by mouth 2 (two) times daily.   spironolactone  25 MG tablet Commonly known as: ALDACTONE  Take 1 tablet (25 mg total) by mouth daily.        Follow-up Information     [facility] Follow up on 03/02/2024.   Specialty: Cardiology Why: at 2:00pm [facility], Entrance C Free Valet Parking Available Contact information: [address] [place]  [address] [phone]               Discharge Exam: Fredricka Weights   02/19/24 0523 02/20/24 0600 02/22/24 0531  Weight: 119.5 kg 113.8 kg 111.6 kg   BP 126/72 (BP Location: Right Arm)   Pulse 100   Temp 97.9 F (36.6  C) (Oral)   Resp 16   Ht 5' 5 (1.651 m)   Wt 111.6 kg   SpO2 96%   BMI 40.94 kg/m   Patient is feeling better, dyspnea and edema have improved, no PND or orthopnea  Neurology awake and alert ENT with no pallor, or icterus Cardiovascular with S1 and S2 present and regular with no gallops or rubs. Positive systolic murmur at the apex No JVD Respiratory with no rales or wheezing, no rhonchi  Abdomen protuberant with no distention, soft and non tender,  No lower extremity edema   Condition at discharge: stable  The results of significant diagnostics from this hospitalization (including imaging, microbiology, ancillary and laboratory) are listed below for reference.   Imaging Studies: DG Chest Port 1 View Result Date: 02/18/2024 CLINICAL DATA:  Chest pain. Congestive heart failure, edema, shortness of breath. EXAM: PORTABLE CHEST 1 VIEW COMPARISON:  02/01/2024 FINDINGS: Cardiac pacemaker. Cardiac enlargement with mild vascular congestion. No airspace disease or consolidation. No pleural effusion or pneumothorax. Mediastinal contours appear intact. IMPRESSION: Cardiac enlargement with mild vascular congestion.  No edema. Electronically Signed   By: Elsie Gravely M.D.   On: 02/18/2024 19:10   VAS US  LOWER EXTREMITY VENOUS (DVT) (7a-7p) Result Date: 02/01/2024  Lower Venous DVT Study Patient Name:  ZHAIRE LOCKER  Date of Exam:   02/01/2024 Medical Rec #: [id number]              Accession #:    [id number] Date of Birth: May 19, 1971               Patient Gender: M Patient Age:   6 years Exam Location:  [facility] Procedure:      VAS US  LOWER EXTREMITY VENOUS (DVT) Referring Phys: WARREN BARRETT --------------------------------------------------------------------------------  Indications: Pain.  Comparison Study: Previous exam on 01/28/2021 was negative for DVT. Performing Technologist: Ezzie Potters RVT, RDMS  Examination Guidelines: A complete evaluation includes B-mode imaging, spectral  Doppler, color Doppler, and power Doppler as needed of all accessible portions of each vessel. Bilateral testing is considered an integral part of a complete examination. Limited examinations for reoccurring indications may be performed as noted. The reflux portion of the exam is performed with the patient in reverse Trendelenburg.  +---------+---------------+---------+-----------+----------+--------------+ RIGHT    CompressibilityPhasicitySpontaneityPropertiesThrombus Aging +---------+---------------+---------+-----------+----------+--------------+ CFV      Full           Yes      Yes                                 +---------+---------------+---------+-----------+----------+--------------+  SFJ      Full                                                        +---------+---------------+---------+-----------+----------+--------------+ FV Prox  Full           Yes      Yes                                 +---------+---------------+---------+-----------+----------+--------------+ FV Mid   Full           Yes      Yes                                 +---------+---------------+---------+-----------+----------+--------------+ FV DistalFull           Yes      Yes                                 +---------+---------------+---------+-----------+----------+--------------+ PFV      Full                                                        +---------+---------------+---------+-----------+----------+--------------+ POP      Full           Yes      Yes                                 +---------+---------------+---------+-----------+----------+--------------+ PTV      Full                                                        +---------+---------------+---------+-----------+----------+--------------+ PERO     Full                                                        +---------+---------------+---------+-----------+----------+--------------+    +---------+---------------+---------+-----------+----------+--------------+ LEFT     CompressibilityPhasicitySpontaneityPropertiesThrombus Aging +---------+---------------+---------+-----------+----------+--------------+ CFV      Full           Yes      Yes                                 +---------+---------------+---------+-----------+----------+--------------+ SFJ      Full                                                        +---------+---------------+---------+-----------+----------+--------------+  FV Prox  Full           Yes      Yes                                 +---------+---------------+---------+-----------+----------+--------------+ FV Mid   Full           Yes      Yes                                 +---------+---------------+---------+-----------+----------+--------------+ FV DistalFull           Yes      Yes                                 +---------+---------------+---------+-----------+----------+--------------+ PFV      Full                                                        +---------+---------------+---------+-----------+----------+--------------+ POP      Full           Yes      Yes                                 +---------+---------------+---------+-----------+----------+--------------+ PTV      Full                                                        +---------+---------------+---------+-----------+----------+--------------+ PERO     Full                                                        +---------+---------------+---------+-----------+----------+--------------+     Summary: BILATERAL: - No evidence of deep vein thrombosis seen in the lower extremities, bilaterally. -No evidence of popliteal cyst, bilaterally.   *See table(s) above for measurements and observations. Electronically signed by Penne Colorado MD on 02/01/2024 at 4:44:22 PM.    Final    DG Foot Complete Right Result Date: 02/01/2024 EXAM:  [id number] CHL (XR FOOT 3 OR MORE VIEWS), [phone] CHL (XR FOOT 3 OR MORE VIEWS LEFT) COMPARISON: None available. CLINICAL HISTORY: Heel pain, swelling. bilateral foot swelling and pain, left more than right, for past week. FINDINGS: BONES AND JOINTS: No acute fracture. No focal osseous lesion. No joint dislocation. Right-sided anterior talar beaking. SOFT TISSUES: Crescentic calcific density projects over the lateral aspect of the fourth toe metatarsophalangeal joint in the left foot. Mild diffuse soft tissue swelling. IMPRESSION: 1. Mild diffuse soft tissue swelling, likely related to the reported bilateral foot swelling and pain. 2. 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 3. Crescentic calcific density projecting over the lateral left fourth toe metatarsophalangeal joint may represent a foreign body or be related to  sequela of remote injury. Electronically signed by: Manford Cummins MD 02/01/2024 02:07 PM EDT RP Workstation: [id number]   DG Foot Complete Left Result Date: 02/01/2024 EXAM: [id number] CHL (XR FOOT 3 OR MORE VIEWS), [id number] CHL (XR FOOT 3 OR MORE VIEWS LEFT) COMPARISON: None available. CLINICAL HISTORY: Heel pain, swelling. bilateral foot swelling and pain, left more than right, for past week. FINDINGS: BONES AND JOINTS: No acute fracture. No focal osseous lesion. No joint dislocation. Right-sided anterior talar beaking. SOFT TISSUES: Crescentic calcific density projects over the lateral aspect of the fourth toe metatarsophalangeal joint in the left foot. Mild diffuse soft tissue swelling. IMPRESSION: 1. Mild diffuse soft tissue swelling, likely related to the reported bilateral foot swelling and pain. 2. 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 3. Crescentic calcific density projecting over the lateral left fourth toe metatarsophalangeal joint may represent a  foreign body or be related to sequela of remote injury. Electronically signed by: Manford Cummins MD 02/01/2024 02:07 PM EDT RP Workstation: [id number]   DG Chest 2 View Result Date: 02/01/2024 CLINICAL DATA:  Shortness of breath.  Congestive heart failure. EXAM: CHEST - 2 VIEW COMPARISON:  Radiographs 01/06/2024 and 01/05/2024.  CT 12/07/2022. FINDINGS: Left subclavian AICD leads appear unchanged, projecting over the right atrium, right ventricle and coronary sinus. Stable cardiomegaly. Previously demonstrated vascular congestion has resolved. There is no edema, confluent airspace disease, pleural effusion or pneumothorax. Stable mild degenerative changes in the spine without acute osseous abnormality. IMPRESSION: Interval resolution of previously demonstrated vascular congestion. No acute cardiopulmonary process. Stable cardiomegaly. Electronically Signed   By: Elsie Perone M.D.   On: 02/01/2024 12:17    Microbiology: Results for orders placed or performed during the hospital encounter of 01/04/24  MRSA Next Gen by PCR, Nasal     Status: Abnormal   Collection Time: 01/04/24  2:39 PM   Specimen: Nasal Mucosa; Nasal Swab  Result Value Ref Range Status   MRSA by PCR Next Gen DETECTED (A) NOT DETECTED Final    Comment: RESULT CALLED TO, READ BACK BY AND VERIFIED WITH: RN LUKE BUDGE ON 01/04/24 @ 1733 BY DRT (NOTE) The GeneXpert MRSA Assay (FDA approved for NASAL specimens only), is one component of a comprehensive MRSA colonization surveillance program. It is not intended to diagnose MRSA infection nor to guide or monitor treatment for MRSA infections. Test performance is not FDA approved in patients less than 57 years old. Performed at [facility], [address] [place], [place] [address]   Culture, blood (Routine X 2) w Reflex to ID Panel     Status: None   Collection Time: 01/04/24  4:33 PM   Specimen: BLOOD  Result Value Ref Range Status   Specimen Description BLOOD SITE NOT  SPECIFIED  Final   Special Requests   Final    BOTTLES DRAWN AEROBIC ONLY Blood Culture adequate volume   Culture   Final    NO GROWTH 5 DAYS Performed at [facility], [address], [place], [place] [address]    Report Status 01/09/2024 FINAL  Final  Culture, blood (single) w Reflex to ID Panel     Status: None   Collection Time: 01/04/24  4:33 PM   Specimen: BLOOD  Result Value Ref Range Status   Specimen Description BLOOD SITE NOT SPECIFIED  Final   Special Requests   Final    BOTTLES DRAWN AEROBIC ONLY Blood Culture adequate volume   Culture  Final    NO GROWTH 5 DAYS Performed at [facility], [address], [place], [place] [address]    Report Status 01/09/2024 FINAL  Final  Culture, blood (Routine X 2) w Reflex to ID Panel     Status: None   Collection Time: 01/04/24  4:34 PM   Specimen: BLOOD RIGHT HAND  Result Value Ref Range Status   Specimen Description BLOOD RIGHT HAND  Final   Special Requests   Final    BOTTLES DRAWN AEROBIC AND ANAEROBIC Blood Culture results may not be optimal due to an inadequate volume of blood received in culture bottles   Culture   Final    NO GROWTH 5 DAYS Performed at [facility], [address], [place], [place] [address]    Report Status 01/09/2024 FINAL  Final  Culture, Respiratory w Gram Stain (tracheal aspirate)     Status: None   Collection Time: 01/05/24 10:28 AM   Specimen: Tracheal Aspirate; Respiratory  Result Value Ref Range Status   Specimen Description TRACHEAL ASPIRATE  Final   Special Requests NONE  Final   Gram Stain   Final    ABUNDANT WBC PRESENT, PREDOMINANTLY PMN RARE GRAM POSITIVE COCCI IN PAIRS    Culture   Final    MODERATE GROUP B STREP(S.AGALACTIAE)ISOLATED TESTING AGAINST S. AGALACTIAE NOT ROUTINELY PERFORMED DUE TO PREDICTABILITY OF AMP/PEN/VAN SUSCEPTIBILITY. Performed at [facility], [address] [place], [place] [address]    Report Status 01/06/2024 FINAL  Final     Labs: CBC: Recent Labs  Lab 02/18/24 1731 02/19/24 0305  WBC 9.7 9.6  NEUTROABS 6.3  --   HGB 11.4* 12.1*  HCT 34.9* 37.1*  MCV 89.9 88.5  PLT 277 273   Basic Metabolic Panel: Recent Labs  Lab 02/18/24 1731 02/19/24 0305 02/20/24 0742 02/21/24 1044 02/22/24 0739  NA 138 137 136 136 134*  K 4.1 3.7 3.9 4.2 3.9  CL 108 104 97* 100 94*  CO2 24 27 28 27 27   GLUCOSE 177* 198* 207* 249* 239*  BUN 15 12 12  22* 24*  CREATININE 0.75 0.78 0.96 1.28* 1.09  CALCIUM  8.0* 8.6* 9.4 9.1 9.2  MG  --   --  1.8 2.2 2.2   Liver Function Tests: No results for input(s): AST, ALT, ALKPHOS, BILITOT, PROT, ALBUMIN in the last 168 hours. CBG: Recent Labs  Lab 02/21/24 1155 02/21/24 1614 02/21/24 2138 02/22/24 0654 02/22/24 1146  GLUCAP 245* 202* 224* 259* 198*    Discharge time spent: greater than 30 minutes.  Signed: Elidia Toribio Furnace, MD [facility] 02/22/2024

## 2024-02-22 NOTE — Plan of Care (Signed)

## 2024-02-22 NOTE — Progress Notes (Signed)
 Pt. Placed cpap on himself. 4 liters of oxygen titrated into the cpap. Pt. Tolerating well.

## 2024-02-22 NOTE — Inpatient Diabetes Management (Signed)
 Inpatient Diabetes Program Recommendations  AACE/ADA: New Consensus Statement on Inpatient Glycemic Control (2015)  Target Ranges:  Prepandial:   less than 140 mg/dL      Peak postprandial:   less than 180 mg/dL (1-2 hours)      Critically ill patients:  140 - 180 mg/dL   Lab Results  Component Value Date   GLUCAP 259 (H) 02/22/2024   HGBA1C 13.9 (H) 01/04/2024    Review of Glycemic Control  Diabetes history: DM2 Outpatient Diabetes medications: Novolog  25 TID, Farxziga 10 daily Current orders for Inpatient glycemic control: Farxiga  10 daily, Novolog  0-15 TID with meals and 0-5 HS  HgbA1C - 13.9%  Inpatient Diabetes Program Recommendations:    Consider adding Semglee  15 units q 24H  Spoke with pt at bedside regarding his diabetes and 13.9%. Pt appeared uninterested in talking. States he takes his insulin  some but not all the time. Likely drug addiction is an issue with controlling his blood sugars.   Pt states he monitors blood sugars on most days. Explained how hyperglycemia leads to damage within blood vessels which lead to the common complications seen with uncontrolled diabetes. Stressed to the patient the importance of improving glycemic control to prevent further complications from uncontrolled diabetes. Discussed impact of nutrition, exercise, stress, sickness, and medications on diabetes control.  Pt had no questions.  Thank you. Shona Brandy, RD, LDN, CDCES Inpatient Diabetes Coordinator [phone]

## 2024-02-22 NOTE — Progress Notes (Incomplete)
 PROGRESS NOTE    Luis [name]  FMW:[id number] DOB: 08-Aug-1970 DOA: 02/18/2024 PCP: [name]   53/M w systolic CHF, hypertension, dyslipidemia, T2DM, cocaine use, latent TB and depression who presented with lower extremity painful edema. VSS, abdomen protuberant but not distended, positive lower extremity edema.  BNP 452 , troponin 20  CXR w cardiomegaly, bilateral hilar vascular congestion, pacemaker defibrillator in place with one right atrial lead and biventricular leads.  -placed on IV furosemide  for diuresis.   Subjective:   Assessment and Plan:  Acute on chronic systolic CHF (congestive heart failure) (HCC) Echo with EF <20%, global hypokinesis, RV preserved,- -sp 80 mg IV furosemide  this am, will hold on pm dose.  Continue entresto  and spironolactone  Digoxin  125 mcg daily and SGLT 2 inh  Hold on B blocker due to risk of low output failure.   Hypertension Continue blood pressure control with entresto  and spironolactone   GERD (gastroesophageal reflux disease) Continue pantoprazole    Generalized anxiety disorder Continue with mirtazapine   Chronic pain syndrome on hydrocodone , duloxetine  and gabapentin   Add as needed lorazepam  for anxiety, discontinue hydroxyzine  for now    Type 2 diabetes mellitus with hyperlipidemia (HCC) Uncontrolled with hyperglycemia, Hgb A1c 13.9  Continue glucose cover and monitoring with insulin  sliding scale Continue with statin   Cocaine use Recently used   Obesity, class 3 Calculated BMI is 43.8  OSA -will need Cpap   Hypochloremia Today renal function with serum cr at 1,28 with K at 4,2 and serum bicarbonate at 27  Na 136 and Mg 2.2   Hold PM dose of furosemide   Continue diuresis and follow up renal function and electrolytes in am.   TB lung, latent Continue with isoniazid  and pyridoxine      DVT prophylaxis: lovenox  Code Status: Full Code Family Communication: None present Disposition Plan:    Consultants:    Procedures:   Antimicrobials:    Objective: Vitals:   02/22/24 0408 02/22/24 0531 02/22/24 0859 02/22/24 1145  BP: 129/71  138/77 126/72  Pulse: (!) 101  (!) 105 100  Resp: 20  18 16   Temp: 97.8 F (36.6 C)  97.8 F (36.6 C) 97.9 F (36.6 C)  TempSrc: Oral  Oral Oral  SpO2: 95%  94% 96%  Weight:  111.6 kg    Height:        Intake/Output Summary (Last 24 hours) at 02/22/2024 1345 Last data filed at 02/22/2024 1100 Gross per 24 hour  Intake 1200 ml  Output 2250 ml  Net -1050 ml   Filed Weights   02/19/24 0523 02/20/24 0600 02/22/24 0531  Weight: 119.5 kg 113.8 kg 111.6 kg    Examination:  General exam: Appears calm and comfortable  Respiratory system: Clear to auscultation Cardiovascular system: S1 & S2 heard, RRR.  Abd: nondistended, soft and nontender.Normal bowel sounds heard. Central nervous system: Alert and oriented. No focal neurological deficits. Extremities: no edema Skin: No rashes Psychiatry:  Mood & affect appropriate.     Data Reviewed:   CBC: Recent Labs  Lab 02/18/24 1731 02/19/24 0305  WBC 9.7 9.6  NEUTROABS 6.3  --   HGB 11.4* 12.1*  HCT 34.9* 37.1*  MCV 89.9 88.5  PLT 277 273   Basic Metabolic Panel: Recent Labs  Lab 02/18/24 1731 02/19/24 0305 02/20/24 0742 02/21/24 1044 02/22/24 0739  NA 138 137 136 136 134*  K 4.1 3.7 3.9 4.2 3.9  CL 108 104 97* 100 94*  CO2 24 27 28 27 27   GLUCOSE 177*  198* 207* 249* 239*  BUN 15 12 12  22* 24*  CREATININE 0.75 0.78 0.96 1.28* 1.09  CALCIUM  8.0* 8.6* 9.4 9.1 9.2  MG  --   --  1.8 2.2 2.2   GFR: Estimated Creatinine Clearance: 88.3 mL/min (by C-G formula based on SCr of 1.09 mg/dL). Liver Function Tests: No results for input(s): AST, ALT, ALKPHOS, BILITOT, PROT, ALBUMIN in the last 168 hours. No results for input(s): LIPASE, AMYLASE in the last 168 hours. No results for input(s): AMMONIA in the last 168 hours. Coagulation Profile: No results  for input(s): INR, PROTIME in the last 168 hours. Cardiac Enzymes: No results for input(s): CKTOTAL, CKMB, CKMBINDEX, TROPONINI in the last 168 hours. BNP (last 3 results) No results for input(s): PROBNP in the last 8760 hours. HbA1C: No results for input(s): HGBA1C in the last 72 hours. CBG: Recent Labs  Lab 02/21/24 1155 02/21/24 1614 02/21/24 2138 02/22/24 0654 02/22/24 1146  GLUCAP 245* 202* 224* 259* 198*   Lipid Profile: No results for input(s): CHOL, HDL, LDLCALC, TRIG, CHOLHDL, LDLDIRECT in the last 72 hours. Thyroid  Function Tests: No results for input(s): TSH, T4TOTAL, FREET4, T3FREE, THYROIDAB in the last 72 hours. Anemia Panel: No results for input(s): VITAMINB12, FOLATE, FERRITIN, TIBC, IRON, RETICCTPCT in the last 72 hours. Urine analysis:    Component Value Date/Time   COLORURINE YELLOW 11/11/2023 0019   APPEARANCEUR CLEAR 11/11/2023 0019   LABSPEC 1.032 (H) 11/11/2023 0019   PHURINE 5.0 11/11/2023 0019   GLUCOSEU >=500 (A) 11/11/2023 0019   HGBUR MODERATE (A) 11/11/2023 0019   BILIRUBINUR NEGATIVE 11/11/2023 0019   KETONESUR NEGATIVE 11/11/2023 0019   PROTEINUR 100 (A) 11/11/2023 0019   NITRITE NEGATIVE 11/11/2023 0019   LEUKOCYTESUR NEGATIVE 11/11/2023 0019   Sepsis Labs: @LABRCNTIP (procalcitonin:4,lacticidven:4)  )No results found for this or any previous visit (from the past 240 hours).   Radiology Studies: No results found.   Scheduled Meds:  atropine   1 drop Right Eye BID   clopidogrel   75 mg Oral Daily   dapagliflozin  propanediol  10 mg Oral Daily   digoxin   125 mcg Oral Daily   DULoxetine   30 mg Oral Daily   enoxaparin  (LOVENOX ) injection  40 mg Subcutaneous Q24H   gabapentin   300 mg Oral BID   insulin  aspart  0-15 Units Subcutaneous TID WC   insulin  aspart  0-5 Units Subcutaneous QHS   isoniazid   300 mg Oral Daily   pantoprazole   40 mg Oral BID   prazosin   2 mg Oral QHS   prednisoLONE   acetate  1 drop Right Eye QID   pyridOXINE   50 mg Oral Daily   rosuvastatin   20 mg Oral Daily   sacubitril -valsartan   1 tablet Oral BID   spironolactone   25 mg Oral Daily   Continuous Infusions:   LOS: 4 days    Time spent:    Sigurd Pac, MD [facility]   02/22/2024, 1:45 PM

## 2024-02-23 ENCOUNTER — Other Ambulatory Visit: Payer: Self-pay

## 2024-02-23 DIAGNOSIS — I5023 Acute on chronic systolic (congestive) heart failure: Secondary | ICD-10-CM | POA: Diagnosis not present

## 2024-02-23 LAB — GLUCOSE, CAPILLARY
Glucose-Capillary: 221 mg/dL — ABNORMAL HIGH (ref 70–99)
Glucose-Capillary: 243 mg/dL — ABNORMAL HIGH (ref 70–99)
Glucose-Capillary: 269 mg/dL — ABNORMAL HIGH (ref 70–99)
Glucose-Capillary: 201 mg/dL — ABNORMAL HIGH (ref 70–99)

## 2024-02-23 LAB — VITAMIN B12: Vitamin B-12: 212 pg/mL (ref 180–914)

## 2024-02-23 MED ORDER — INSULIN GLARGINE 100 UNIT/ML ~~LOC~~ SOLN
20.0000 [IU] | Freq: Every day | SUBCUTANEOUS | Status: DC
  Administered 2024-02-23 – 2024-02-25 (×3): 20 [IU] via SUBCUTANEOUS
  Filled 2024-02-23 (×3): qty 0.2

## 2024-02-23 NOTE — TOC Progression Note (Signed)
 Transition of Care [facility]) - Progression Note    Patient Details  Name: Luis [name] MRN: [id number] Date of Birth: [birth year]  [facility]) CM/SW Contact  [name], RN Phone Number: 02/23/2024, 3:05 PM  Clinical Narrative:    Per patient , he will  be going to a hotel at dc, his apartment complex has locked him out of his place.  He will need cab transport at dc.    Expected Discharge Plan: Home w Home Health Services Barriers to Discharge: Continued Medical Work up               Expected Discharge Plan and Services In-house Referral: NA Discharge Planning Services: CM Consult Post Acute Care Choice: Home Health Living arrangements for the past 2 months: Single Family Home                 DME Arranged: N/A DME Agency: NA       HH Arranged: NA           Social Drivers of Health (SDOH) Interventions SDOH Screenings   Food Insecurity: Patient Declined (02/19/2024)  Housing: Unknown (02/19/2024)  Transportation Needs: Patient Declined (02/19/2024)  Utilities: Patient Declined (02/19/2024)  Alcohol  Screen: Low Risk  (12/04/2022)  Depression (PHQ2-9): Medium Risk (11/16/2022)  Financial Resource Strain: High Risk (11/16/2022)  Social Connections: Unknown (08/26/2022)   Received from [facility]  Stress: No Stress Concern Present (04/25/2023)   Received from [facility]  Tobacco Use: High Risk (02/19/2024)    Readmission Risk Interventions    02/21/2024    4:31 PM 01/10/2024   11:50 AM  Readmission Risk Prevention Plan  Transportation Screening Complete Complete  Medication Review Oceanographer) Complete Complete  PCP or Specialist appointment within 3-5 days of discharge  Complete  HRI or Home Care Consult Complete Complete  Palliative Care Screening Not Applicable Not Applicable  Skilled Nursing Facility Not Applicable Not Applicable

## 2024-02-23 NOTE — Plan of Care (Signed)

## 2024-02-24 ENCOUNTER — Inpatient Hospital Stay (HOSPITAL_COMMUNITY)

## 2024-02-24 ENCOUNTER — Ambulatory Visit: Admitting: Cardiovascular Disease

## 2024-02-24 DIAGNOSIS — I5023 Acute on chronic systolic (congestive) heart failure: Secondary | ICD-10-CM | POA: Diagnosis not present

## 2024-02-24 LAB — GLUCOSE, CAPILLARY
Glucose-Capillary: 170 mg/dL — ABNORMAL HIGH (ref 70–99)
Glucose-Capillary: 244 mg/dL — ABNORMAL HIGH (ref 70–99)
Glucose-Capillary: 287 mg/dL — ABNORMAL HIGH (ref 70–99)
Glucose-Capillary: 261 mg/dL — ABNORMAL HIGH (ref 70–99)

## 2024-02-24 MED ORDER — HYOSCYAMINE SULFATE 0.125 MG SL SUBL
0.2500 mg | SUBLINGUAL_TABLET | Freq: Once | SUBLINGUAL | Status: AC
  Administered 2024-02-24: 0.25 mg via SUBLINGUAL
  Filled 2024-02-24: qty 2

## 2024-02-24 MED ORDER — ALUM & MAG HYDROXIDE-SIMETH 200-200-20 MG/5ML PO SUSP
30.0000 mL | Freq: Once | ORAL | Status: AC
  Administered 2024-02-24: 30 mL via ORAL
  Filled 2024-02-24: qty 30

## 2024-02-24 MED ORDER — GABAPENTIN 300 MG PO CAPS
300.0000 mg | ORAL_CAPSULE | Freq: Every day | ORAL | Status: DC
  Administered 2024-02-24 – 2024-02-25 (×2): 300 mg via ORAL
  Filled 2024-02-24 (×2): qty 1

## 2024-02-24 MED ORDER — GABAPENTIN 300 MG PO CAPS
600.0000 mg | ORAL_CAPSULE | Freq: Every day | ORAL | Status: DC
  Administered 2024-02-24: 600 mg via ORAL
  Filled 2024-02-24: qty 2

## 2024-02-24 MED ORDER — CYANOCOBALAMIN 1000 MCG/ML IJ SOLN
1000.0000 ug | Freq: Once | INTRAMUSCULAR | Status: AC
  Administered 2024-02-24: 1000 ug via INTRAMUSCULAR
  Filled 2024-02-24: qty 1

## 2024-02-24 NOTE — Progress Notes (Addendum)
 PROGRESS NOTE    Luis [name]  FMW:[id number] DOB: [birth year] DOA: 02/18/2024 PCP: [name]   53/M w systolic CHF, hypertension, severely uncontrolled T2DM, noncompliance, cocaine use, latent TB and depression who presented with lower extremity painful edema. VSS, abdomen protuberant but not distended, positive lower extremity edema.  BNP 452 , troponin 20  CXR w cardiomegaly, bilateral hilar vascular congestion, pacemaker defibrillator in place with one right atrial lead and biventricular leads.  - Improved with diuresis - Started on gabapentin  for worsening neuropathy  Subjective: - Complains of chest pain this morning, still having significant neuropathy  Assessment and Plan:  Acute on chronic systolic CHF (congestive heart failure) (HCC) Echo with EF <20%, global hypokinesis, RV preserved,- -history of poor compliance with meds, diet, improved with diuresis, 12 L negative, weight down 20 LB - Transitioned back to oral Bumex  -Continue entresto , Aldactone , Farxiga , digoxin  Hold on B blocker due to risk of low output failure.  - PT eval, discharge planning, needs follow-up in CHF clinic  Atypical chest pain - EKG without acute ST-T wave changes, unchanged from prior - Trial of GI cocktail, PPI  Hypertension -Improved, meds as above  GERD (gastroesophageal reflux disease) Continue pantoprazole    Generalized anxiety disorder Continue with mirtazapine   Chronic pain syndrome on hydrocodone , duloxetine  and gabapentin   -Gabapentin  dose increased - B12 in low normal range, add vitamin B12 supplementation  Type 2 diabetes mellitus with hyperlipidemia (HCC) Uncontrolled with hyperglycemia, Hgb A1c 13.9, poorly compliant  C BGs remain elevated, continue Semglee   Cocaine use Recently used   Obesity, class 3 Calculated BMI is 43.8  OSA - Continue Cpap   Homelessness: TOC consult   DVT prophylaxis: lovenox  Code Status: Full Code Family  Communication: None present Disposition Plan: Home tomorrow if stable  Consultants:    Procedures:   Antimicrobials:    Objective: Vitals:   02/23/24 1958 02/24/24 0421 02/24/24 0500 02/24/24 0755  BP: 137/70 139/81  123/80  Pulse: 98   93  Resp: 18 19  19   Temp: 98 F (36.7 C) 98.1 F (36.7 C)  98 F (36.7 C)  TempSrc: Oral Oral  Oral  SpO2: 98% 100%  99%  Weight:   111.2 kg   Height:        Intake/Output Summary (Last 24 hours) at 02/24/2024 1151 Last data filed at 02/24/2024 1044 Gross per 24 hour  Intake 360 ml  Output 1950 ml  Net -1590 ml   Filed Weights   02/22/24 0531 02/23/24 0300 02/24/24 0500  Weight: 111.6 kg 111.9 kg 111.2 kg    Examination:  General exam: Ill, uncomfortable appearing, CPAP on, no distress Respiratory system: decreased breath sounds at the bases Cardiovascular system: S1 & S2 heard, RRR.  Abd: nondistended, soft and nontender.Normal bowel sounds heard. Central nervous system: Alert and oriented. No focal neurological deficits. Extremities: no edema Skin: No rashes Psychiatry:  Mood & affect appropriate.     Data Reviewed:   CBC: Recent Labs  Lab 02/18/24 1731 02/19/24 0305  WBC 9.7 9.6  NEUTROABS 6.3  --   HGB 11.4* 12.1*  HCT 34.9* 37.1*  MCV 89.9 88.5  PLT 277 273   Basic Metabolic Panel: Recent Labs  Lab 02/18/24 1731 02/19/24 0305 02/20/24 0742 02/21/24 1044 02/22/24 0739  NA 138 137 136 136 134*  K 4.1 3.7 3.9 4.2 3.9  CL 108 104 97* 100 94*  CO2 24 27 28 27 27   GLUCOSE 177* 198* 207* 249* 239*  BUN  15 12 12  22* 24*  CREATININE 0.75 0.78 0.96 1.28* 1.09  CALCIUM  8.0* 8.6* 9.4 9.1 9.2  MG  --   --  1.8 2.2 2.2   GFR: Estimated Creatinine Clearance: 88.2 mL/min (by C-G formula based on SCr of 1.09 mg/dL). Liver Function Tests: No results for input(s): AST, ALT, ALKPHOS, BILITOT, PROT, ALBUMIN in the last 168 hours. No results for input(s): LIPASE, AMYLASE in the last 168 hours. No  results for input(s): AMMONIA in the last 168 hours. Coagulation Profile: No results for input(s): INR, PROTIME in the last 168 hours. Cardiac Enzymes: No results for input(s): CKTOTAL, CKMB, CKMBINDEX, TROPONINI in the last 168 hours. BNP (last 3 results) No results for input(s): PROBNP in the last 8760 hours. HbA1C: No results for input(s): HGBA1C in the last 72 hours. CBG: Recent Labs  Lab 02/23/24 1113 02/23/24 1708 02/23/24 2152 02/24/24 0603 02/24/24 1141  GLUCAP 243* 269* 201* 170* 244*   Lipid Profile: No results for input(s): CHOL, HDL, LDLCALC, TRIG, CHOLHDL, LDLDIRECT in the last 72 hours. Thyroid  Function Tests: No results for input(s): TSH, T4TOTAL, FREET4, T3FREE, THYROIDAB in the last 72 hours. Anemia Panel: Recent Labs    02/23/24 0958  VITAMINB12 212   Urine analysis:    Component Value Date/Time   COLORURINE YELLOW 11/11/2023 0019   APPEARANCEUR CLEAR 11/11/2023 0019   LABSPEC 1.032 (H) 11/11/2023 0019   PHURINE 5.0 11/11/2023 0019   GLUCOSEU >=500 (A) 11/11/2023 0019   HGBUR MODERATE (A) 11/11/2023 0019   BILIRUBINUR NEGATIVE 11/11/2023 0019   KETONESUR NEGATIVE 11/11/2023 0019   PROTEINUR 100 (A) 11/11/2023 0019   NITRITE NEGATIVE 11/11/2023 0019   LEUKOCYTESUR NEGATIVE 11/11/2023 0019   Sepsis Labs: @LABRCNTIP (procalcitonin:4,lacticidven:4)  )No results found for this or any previous visit (from the past 240 hours).   Radiology Studies: DG CHEST PORT 1 VIEW Result Date: 02/24/2024 EXAM: 1 VIEW XRAY OF THE CHEST 02/24/2024 10:00:00 AM COMPARISON: AP radiograph of the chest dated 02/18/2024. CLINICAL HISTORY: Chest pain. FINDINGS: LUNGS AND PLEURA: No focal pulmonary opacity. No pulmonary edema. No pleural effusion. No pneumothorax. HEART AND MEDIASTINUM: The heart is mildly enlarged. A biventricular cardiac pacemaker is again demonstrated. BONES AND SOFT TISSUES: No acute osseous abnormality. IMPRESSION: 1.  No acute findings. 2. Mildly enlarged heart and biventricular cardiac pacemaker. Electronically signed by: Timothy Berrigan MD 02/24/2024 10:16 AM EDT RP Workstation: [id number]     Scheduled Meds:  atropine   1 drop Right Eye BID   bumetanide   2 mg Oral BID   clopidogrel   75 mg Oral Daily   dapagliflozin  propanediol  10 mg Oral Daily   digoxin   125 mcg Oral Daily   DULoxetine   30 mg Oral Daily   enoxaparin  (LOVENOX ) injection  40 mg Subcutaneous Q24H   gabapentin   300 mg Oral Daily   gabapentin   600 mg Oral QHS   insulin  aspart  0-15 Units Subcutaneous TID WC   insulin  aspart  0-5 Units Subcutaneous QHS   insulin  glargine  20 Units Subcutaneous Daily   isoniazid   300 mg Oral Daily   pantoprazole   40 mg Oral BID   prazosin   2 mg Oral QHS   prednisoLONE  acetate  1 drop Right Eye QID   pyridOXINE   50 mg Oral Daily   rosuvastatin   20 mg Oral Daily   sacubitril -valsartan   1 tablet Oral BID   spironolactone   25 mg Oral Daily   Continuous Infusions:   LOS: 6 days    Time spent:  Sigurd Pac, MD [facility]   02/24/2024, 11:51 AM

## 2024-02-24 NOTE — TOC Progression Note (Addendum)
 Transition of Care [facility]) - Progression Note    Patient Details  Name: Luis [name] MRN: [id number] Date of Birth: [birth year]  Transition of Care [facility]) CM/SW Contact  Justina Delcia Czar, RN Phone Number: [phone] 02/24/2024, 12:02 PM  Clinical Narrative:     Beatris to pt and caregiver/aide, Rollene at bedside. States he lives alone in his apt but has aide that comes.  States he has been evicted from his apt and currently without housing. Family [place] out of state.  Pt provided contact for Curtistine Silvan, [facility] [place] # [phone] ext [phone]. 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 Reports his current rent is $288. States he received disability and SS check monthly. Pt feels he can pay for a low cost motel until he is able to get housing.    Gave permission to contact his landlord # [phone], contacted landlord and left message for return call. Pt states he would like for landlord to be sent a note stating he is hospitalized to see if they can extend his time to get property from apt.   Provided pt with contact # [facility] # [phone]. Room rates are $54.    Expected Discharge Plan: Home w Home Health Services Barriers to Discharge: Continued Medical Work up               Expected Discharge Plan and Services In-house Referral: NA Discharge Planning Services: CM Consult Post Acute Care Choice: Home Health Living arrangements for the past 2 months: Apartment                 DME Arranged: N/A DME Agency: NA       HH Arranged: NA           Social Drivers of Health (SDOH) Interventions SDOH Screenings   Food Insecurity: Patient Declined (02/19/2024)  Housing: Unknown (02/23/2024)  Transportation Needs: Patient Declined (02/19/2024)  Utilities: Patient Declined (02/19/2024)  Alcohol  Screen: Low Risk  (12/04/2022)  Depression (PHQ2-9): Medium Risk (11/16/2022)  Financial Resource Strain: High Risk (11/16/2022)  Social  Connections: Unknown (08/26/2022)   Received from [facility]  Stress: No Stress Concern Present (04/25/2023)   Received from [facility]  Tobacco Use: High Risk (02/19/2024)    Readmission Risk Interventions    02/21/2024    4:31 PM 01/10/2024   11:50 AM  Readmission Risk Prevention Plan  Transportation Screening Complete Complete  Medication Review Oceanographer) Complete Complete  PCP or Specialist appointment within 3-5 days of discharge  Complete  HRI or Home Care Consult Complete Complete  Palliative Care Screening Not Applicable Not Applicable  Skilled Nursing Facility Not Applicable Not Applicable

## 2024-02-24 NOTE — Progress Notes (Signed)
   02/24/24 2317  BiPAP/CPAP/SIPAP  $ Non-Invasive Ventilator  Non-Invasive Vent Subsequent  BiPAP/CPAP/SIPAP Pt Type Adult  BiPAP/CPAP/SIPAP Resmed  Mask Type Full face mask  Mask Size Medium  IPAP 15 cmH20  EPAP 5 cmH2O  FiO2 (%) 21 %  Patient Home Machine No  Patient Home Mask No  Patient Home Tubing No  CPAP/SIPAP surface wiped down Yes  Device Plugged into RED Power Outlet Yes  BiPAP/CPAP /SiPAP Vitals  Resp 18  SpO2 95 %  Bilateral Breath Sounds Clear;Diminished

## 2024-02-24 NOTE — Evaluation (Signed)
 Physical Therapy Evaluation Patient Details Name: Luis [name] MRN: [id number] DOB: 22-Nov-1970 Today's Date: 02/24/2024  History of Present Illness  53 yo M adm 02/18/24 with LB edema and pain, acute on chronic CHF. PMHx: CHF, latent TB, HTN, HLD, DMII, OSA, VT s/p ICD, GAD, cocaine use, blind right eye  Clinical Impression  Pt with aunt in room on arrival who is his PCA 7 days a week and does the cooking for pt. Pt states he is on fixed income, receives food from food bank and eats whatever he can get, admittedly non-compliant with heart healthy diet and restricted sodium. Pt able to walk in room with RW but denied further ambulation or mobility attempts, uses power scooter in community. Pt with SPO2 94-99% on RA throughout session with HR 111-117. Pt educated for home pulse ox use and monitoring SPO2 prior to supplemental O2 use, walking program and heart healthy diet. PT with decreased activity tolerance and function who will benefit from acute therapy to maximize mobility and safety. HHPT recommended.      If plan is discharge home, recommend the following: Assistance with cooking/housework;Assist for transportation   Can travel by private vehicle        Equipment Recommendations None recommended by PT  Recommendations for Other Services       Functional Status Assessment Patient has had a recent decline in their functional status and/or demonstrates limited ability to make significant improvements in function in a reasonable and predictable amount of time     Precautions / Restrictions Precautions Precautions: Fall Recall of Precautions/Restrictions: Impaired      Mobility  Bed Mobility Overal bed mobility: Modified Independent             General bed mobility comments: HOB 30 degrees without use of rail    Transfers Overall transfer level: Modified independent                      Ambulation/Gait Ambulation/Gait assistance: Supervision Gait  Distance (Feet): 12 Feet Assistive device: Rolling walker (2 wheels) Gait Pattern/deviations: Step-through pattern, Decreased stride length   Gait velocity interpretation: <1.8 ft/sec, indicate of risk for recurrent falls   General Gait Details: pt walked around bed with RW, limited by pain in feet without physical assist and denied further attempts at gait trials  Stairs            Wheelchair Mobility     Tilt Bed    Modified Rankin (Stroke Patients Only)       Balance Overall balance assessment: History of Falls                                           Pertinent Vitals/Pain Pain Assessment Pain Assessment: Faces Faces Pain Scale: Hurts little more Pain Location: feet Pain Descriptors / Indicators: Aching Pain Intervention(s): Limited activity within patient's tolerance, Monitored during session, Repositioned    Home Living Family/patient expects to be discharged to:: Unsure Living Arrangements: Alone Available Help at Discharge: Available PRN/intermittently;Friend(s);Personal care attendant Type of Home: Apartment Home Access: Level entry       Home Layout: One level Home Equipment: Educational psychologist (4 wheels);Electric scooter Additional Comments: Pt was living in apartment but states he got locked out yesterday, states he will plan on going to hotel if he can't get in his apartment    Prior Function Prior Level  of Function : Driving;Needs assist             Mobility Comments: rollator in home, scooter outside, limited gait grossly 30' at a time ADLs Comments: aunt is also PCA 7 days a week there 6 hrs M-F and 2 hrs on the weekend     Extremity/Trunk Assessment   Upper Extremity Assessment Upper Extremity Assessment: Overall WFL for tasks assessed    Lower Extremity Assessment Lower Extremity Assessment: Generalized weakness    Cervical / Trunk Assessment Cervical / Trunk Assessment: Normal  Communication    Communication Communication: No apparent difficulties    Cognition Arousal: Alert Behavior During Therapy: Flat affect   PT - Cognitive impairments: Problem solving                         Following commands: Intact       Cueing       General Comments      Exercises     Assessment/Plan    PT Assessment Patient needs continued PT services  PT Problem List Decreased activity tolerance;Decreased mobility       PT Treatment Interventions DME instruction;Gait training;Functional mobility training;Therapeutic exercise;Patient/family education    PT Goals (Current goals can be found in the Care Plan section)  Acute Rehab PT Goals Patient Stated Goal: not have pain in my feet PT Goal Formulation: With patient/family Time For Goal Achievement: 03/09/24 Potential to Achieve Goals: Fair    Frequency Min 1X/week     Co-evaluation               AM-PAC PT 6 Clicks Mobility  Outcome Measure Help needed turning from your back to your side while in a flat bed without using bedrails?: None Help needed moving from lying on your back to sitting on the side of a flat bed without using bedrails?: None Help needed moving to and from a bed to a chair (including a wheelchair)?: None Help needed standing up from a chair using your arms (e.g., wheelchair or bedside chair)?: A Little Help needed to walk in hospital room?: A Little Help needed climbing 3-5 steps with a railing? : A Little 6 Click Score: 21    End of Session Equipment Utilized During Treatment: Gait belt Activity Tolerance: Patient tolerated treatment well Patient left: in chair;with call bell/phone within reach;with family/visitor present (pt refused chair alarm on) Nurse Communication: Mobility status PT Visit Diagnosis: Other abnormalities of gait and mobility (R26.89);Difficulty in walking, not elsewhere classified (R26.2);Pain;Muscle weakness (generalized) (M62.81) Pain - Right/Left: Left Pain -  part of body: Ankle and joints of foot    Time: 8873-8848 PT Time Calculation (min) (ACUTE ONLY): 25 min   Charges:   PT Evaluation $PT Eval Moderate Complexity: 1 Mod PT Treatments $Therapeutic Activity: 8-22 mins PT General Charges $$ ACUTE PT VISIT: 1 Visit         Lenoard SQUIBB, PT Acute Rehabilitation Services Office: [phone]   Lenoard KATHEE Docker 02/24/2024, 12:32 PM

## 2024-02-24 NOTE — Inpatient Diabetes Management (Signed)
 Inpatient Diabetes Program Recommendations  AACE/ADA: New Consensus Statement on Inpatient Glycemic Control (2015)  Target Ranges:  Prepandial:   less than 140 mg/dL      Peak postprandial:   less than 180 mg/dL (1-2 hours)      Critically ill patients:  140 - 180 mg/dL    Latest Reference Range & Units 02/23/24 11:13 02/23/24 17:08 02/23/24 21:52  Glucose-Capillary 70 - 99 mg/dL 756 (H) 730 (H) 798 (H)  (H): Data is abnormally high  Latest Reference Range & Units 02/24/24 06:03 02/24/24 11:41  Glucose-Capillary 70 - 99 mg/dL 829 (H) 755 (H)  (H): Data is abnormally high     Home DM Meds: Novolog  25 units TID        Lantus  38 units BID      Farxiga  10 mg daily   Current Orders: Lantus  20 uits daily     Farxiga  10 mg daily     Novolog  Moderate Correction Scale/ SSI (0-15 units) TID AC + HS    MD- Note Lantus  20 units daily started yest   CBG 244 at Lunch today  Please consider starting Novolog  Meal Coverage:  Novolog  4 units TID with meals HOLD if pt NPO HOLD if pt eats <50% meals    --Will follow patient during hospitalization--  Adina Rudolpho Arrow RN, MSN, CDCES Diabetes Coordinator Inpatient Glycemic Control Team Team Pager: [phone] (8a-5p)

## 2024-02-25 ENCOUNTER — Other Ambulatory Visit (HOSPITAL_COMMUNITY): Payer: Self-pay

## 2024-02-25 DIAGNOSIS — I5023 Acute on chronic systolic (congestive) heart failure: Secondary | ICD-10-CM | POA: Diagnosis not present

## 2024-02-25 LAB — BASIC METABOLIC PANEL WITH GFR
Anion gap: 11 (ref 5–15)
BUN: 24 mg/dL — ABNORMAL HIGH (ref 6–20)
CO2: 28 mmol/L (ref 22–32)
Calcium: 8.7 mg/dL — ABNORMAL LOW (ref 8.9–10.3)
Chloride: 98 mmol/L (ref 98–111)
Creatinine, Ser: 1.05 mg/dL (ref 0.61–1.24)
GFR, Estimated: >60 mL/min (ref >60)
Glucose, Bld: 217 mg/dL — ABNORMAL HIGH (ref 70–99)
Potassium: 3.7 mmol/L (ref 3.5–5.1)
Sodium: 137 mmol/L (ref 135–145)

## 2024-02-25 LAB — GLUCOSE, CAPILLARY: Glucose-Capillary: 264 mg/dL — ABNORMAL HIGH (ref 70–99)

## 2024-02-25 MED ORDER — EMPAGLIFLOZIN 10 MG PO TABS
10.0000 mg | ORAL_TABLET | Freq: Every day | ORAL | 0 refills | Status: AC
  Filled 2024-02-25: qty 30, 30d supply, fill #0

## 2024-02-25 MED ORDER — SACUBITRIL-VALSARTAN 24-26 MG PO TABS
1.0000 | ORAL_TABLET | Freq: Two times a day (BID) | ORAL | 0 refills | Status: AC
  Filled 2024-02-25: qty 60, 30d supply, fill #0

## 2024-02-25 MED ORDER — BUMETANIDE 2 MG PO TABS: 2.0000 mg | ORAL_TABLET | Freq: Two times a day (BID) | ORAL | 0 refills | Status: AC

## 2024-02-25 MED ORDER — GABAPENTIN 300 MG PO CAPS
300.0000 mg | ORAL_CAPSULE | Freq: Two times a day (BID) | ORAL | 0 refills | Status: AC
  Filled 2024-02-25: qty 120, 30d supply, fill #0

## 2024-02-25 NOTE — Discharge Summary (Addendum)
 Physician Discharge Summary  AADYN BUCHHEIT FMW:[id number] DOB: 07/14/1970 DOA: 02/18/2024  PCP: [name]  Admit date: 02/18/2024 Discharge date: 02/25/2024  Time spent: 45 minutes  Recommendations for Outpatient Follow-up:  High risk of quick rehospitalization CHF TOC clinic on 9/4 Needs housing assistance from [place]   Discharge Diagnoses:  Homelessness Acute on chronic systolic CHF  Noncompliance Polysubstance abuse Atypical chest pain Chronic pain Uncontrolled type 2 diabetes mellitus with hyperglycemia Poor compliance with insulin    Hypertension   GERD (gastroesophageal reflux disease)   Type 2 diabetes mellitus with hyperlipidemia (HCC)   Generalized anxiety disorder   Cocaine use   Obesity, class 3   Hypochloremia   TB lung, latent   Discharge Condition: Improved  Diet recommendation: Low DM heart healthy, diabetic  Filed Weights   02/23/24 0300 02/24/24 0500 02/25/24 0331  Weight: 111.9 kg 111.2 kg 111.8 kg    History of present illness:  53/M w systolic CHF, hypertension, severely uncontrolled T2DM, noncompliance, cocaine use, latent TB and depression who presented with lower extremity painful edema. VSS, abdomen protuberant but not distended, positive lower extremity edema.  BNP 452 , troponin 20  CXR w cardiomegaly, bilateral hilar vascular congestion, pacemaker defibrillator in place with one right atrial lead and biventricular leads.  - Improved with diuresis - Started on gabapentin  for worsening neuropathy  Hospital Course:   Acute on chronic systolic CHF (congestive heart failure) (HCC) Echo with EF <20%, global hypokinesis, RV preserved,- -long history of poor compliance with meds, diet, improved with diuresis, 12 L negative, weight down 20 LB - Transitioned back to oral Bumex  -Continue entresto , Aldactone , Farxiga , digoxin  Hold on B blocker due to risk of low output failure.  - Follow-up with CHF clinic arranged -High risk of  quick rehospitalization considering long history of substance abuse, noncompliance with meds and now homelessness   Homelessness - Recently evicted from his apartment, patient reports that he will try to move into a motel after discharge today, he declined information on shelters  Atypical chest pain - EKG without acute ST-T wave changes, unchanged from prior - Improved with supportive care, GI cocktail -No significant CAD on cardiac cath 10/23   Hypertension -Improved, meds as above   GERD (gastroesophageal reflux disease) Continue pantoprazole     Generalized anxiety disorder Continue with mirtazapine    Chronic pain syndrome on hydrocodone , duloxetine  and gabapentin   -Gabapentin  dose increased - B12 in low normal range, started vitamin B12 supplementation   Type 2 diabetes mellitus with hyperlipidemia (HCC) Uncontrolled with hyperglycemia, Hgb A1c 13.9, poorly compliant  CBGs remain elevated, continue Semglee , dose increased   Cocaine use Recently used    Obesity, class 3 Calculated BMI is 43.8   OSA - Continue Cpap   Discharge Exam: Vitals:   02/25/24 0331 02/25/24 0812  BP: 137/76 136/88  Pulse: 93 (!) 103  Resp: 20 18  Temp: 98.1 F (36.7 C) 98.6 F (37 C)  SpO2: 97% 99%   Gen: Awake, Alert, Oriented X 3,  HEENT: no JVD Lungs: Good air movement bilaterally, CTAB CVS: S1S2/RRR Abd: soft, Non tender, non distended, BS present Extremities: No edema Skin: no new rashes on exposed skin   Discharge Instructions   Discharge Instructions     Diet - low sodium heart healthy   Complete by: As directed    Diet Carb Modified   Complete by: As directed    Increase activity slowly   Complete by: As directed       Allergies  as of 02/25/2024       Reactions   Aspirin Anaphylaxis, Other (See Comments)   Throat closing   Iodine-131 Hives   Peanut (diagnostic) Anaphylaxis   Throat closes   Iodinated Contrast Media Hives   Can be premedicated    Latex  Hives, Rash   Penicillins Nausea And Vomiting   Ultram  [tramadol ] Hives, Nausea Only   Zestril [lisinopril] Cough   Cinnamon    Amoxicillin-pot Clavulanate Diarrhea   Lidocaine  Rash   Rash from adhesive on lidocaine  patches         Medication List     STOP taking these medications    BD Pen Needle Nano U/F 32G X 4 MM Misc Generic drug: Insulin  Pen Needle   carvedilol  12.5 MG tablet Commonly known as: COREG    cholecalciferol 25 MCG (1000 UNIT) tablet Commonly known as: VITAMIN D3   clopidogrel  75 MG tablet Commonly known as: PLAVIX    diclofenac  Sodium 1 % Gel Commonly known as: VOLTAREN    Farxiga  10 MG Tabs tablet Generic drug: dapagliflozin  propanediol Replaced by: Jardiance  10 MG Tabs tablet   furosemide  80 MG tablet Commonly known as: LASIX    loratadine  10 MG tablet Commonly known as: CLARITIN    metoprolol  succinate 25 MG 24 hr tablet Commonly known as: TOPROL -XL   multivitamin tablet   NovoLOG  FlexPen 100 UNIT/ML FlexPen Generic drug: insulin  aspart   pantoprazole  40 MG tablet Commonly known as: PROTONIX    Potassium Chloride  ER 20 MEQ Tbcr   prazosin  2 MG capsule Commonly known as: MINIPRESS    thiamine  100 MG tablet Commonly known as: VITAMIN B1   traZODone  150 MG tablet Commonly known as: DESYREL        TAKE these medications    atropine  1 % ophthalmic solution Place 1 drop into the right eye 2 (two) times daily.   bumetanide  2 MG tablet Commonly known as: BUMEX  Take 1 tablet (2 mg total) by mouth 2 (two) times daily.   digoxin  0.125 MG tablet Commonly known as: LANOXIN  Take 1 tablet (125 mcg total) by mouth daily.   DULoxetine  30 MG capsule Commonly known as: CYMBALTA  Take 1 capsule (30 mg total) by mouth daily.   Entresto  24-26 MG Generic drug: sacubitril -valsartan  Take 1 tablet by mouth 2 (two) times daily.   gabapentin  300 MG capsule Commonly known as: NEURONTIN  Take 1-2 capsules (300-600 mg total) by mouth 2 (two) times  daily.   hydrOXYzine  25 MG capsule Commonly known as: VISTARIL  Take 1 capsule (25 mg total) by mouth 3 (three) times daily as needed for anxiety.   isoniazid  300 MG tablet Commonly known as: NYDRAZID  Take 300 mg by mouth in the morning.   Jardiance  10 MG Tabs tablet Generic drug: empagliflozin  Take 1 tablet (10 mg total) by mouth daily. Replaces: Farxiga  10 MG Tabs tablet   mirtazapine  15 MG tablet Commonly known as: REMERON  Take 0.5 tablets (7.5 mg total) by mouth at bedtime as needed (sleep).   prednisoLONE  acetate 1 % ophthalmic suspension Commonly known as: PRED FORTE  Place 1 drop into the right eye 4 (four) times daily.   pyridOXINE  50 MG tablet Commonly known as: B-6 Take 50 mg by mouth daily.   rosuvastatin  40 MG tablet Commonly known as: CRESTOR  Take 0.5 tablets (20 mg total) by mouth daily.   spironolactone  25 MG tablet Commonly known as: ALDACTONE  Take 1 tablet (25 mg total) by mouth daily.       Allergies  Allergen Reactions   Aspirin Anaphylaxis and Other (  See Comments)    Throat closing    Iodine-131 Hives   Peanut (Diagnostic) Anaphylaxis    Throat closes   Iodinated Contrast Media Hives    Can be premedicated    Latex Hives and Rash   Penicillins Nausea And Vomiting   Ultram  [Tramadol ] Hives and Nausea Only   Zestril [Lisinopril] Cough   Cinnamon    Amoxicillin-Pot Clavulanate Diarrhea   Lidocaine  Rash    Rash from adhesive on lidocaine  patches     Follow-up Information     [facility] Follow up on 03/02/2024.   Specialty: Cardiology Why: at 2:00pm [facility], Entrance C Free Valet Parking Available Contact information: [address] [place]  [phone]        [facility] Follow up.   Specialty: Home Health Services Why: Agency will call you to set up apt times Contact information: [address] [place]  [address] [phone]                  The results of significant diagnostics from this hospitalization (including imaging, microbiology, ancillary and laboratory) are listed below for reference.    Significant Diagnostic Studies: DG CHEST PORT 1 VIEW Result Date: 02/24/2024 EXAM: 1 VIEW XRAY OF THE CHEST 02/24/2024 10:00:00 AM COMPARISON: AP radiograph of the chest dated 02/18/2024. CLINICAL HISTORY: Chest pain. FINDINGS: LUNGS AND PLEURA: No focal pulmonary opacity. No pulmonary edema. No pleural effusion. No pneumothorax. HEART AND MEDIASTINUM: The heart is mildly enlarged. A biventricular cardiac pacemaker is again demonstrated. BONES AND SOFT TISSUES: No acute osseous abnormality. IMPRESSION: 1. No acute findings. 2. Mildly enlarged heart and biventricular cardiac pacemaker. Electronically signed by: Evalene Coho MD 02/24/2024 10:16 AM EDT RP Workstation: [id number]   DG Chest Port 1 View Result Date: 02/18/2024 CLINICAL DATA:  Chest pain. Congestive heart failure, edema, shortness of breath. EXAM: PORTABLE CHEST 1 VIEW COMPARISON:  02/01/2024 FINDINGS: Cardiac pacemaker. Cardiac enlargement with mild vascular congestion. No airspace disease or consolidation. No pleural effusion or pneumothorax. Mediastinal contours appear intact. IMPRESSION: Cardiac enlargement with mild vascular congestion.  No edema. Electronically Signed   By: Elsie Gravely M.D.   On: 02/18/2024 19:10   VAS US  LOWER EXTREMITY VENOUS (DVT) (7a-7p) Result Date: 02/01/2024  Lower Venous DVT Study Patient Name:  Luis [name]  Date of Exam:   02/01/2024 Medical Rec #: [id number]              Accession #:    [id number] Date of Birth: 09-Mar-1971               Patient Gender: M Patient Age:   85 years Exam Location:  [facility] Procedure:      VAS US  LOWER EXTREMITY VENOUS (DVT) Referring Phys: WARREN BARRETT --------------------------------------------------------------------------------  Indications: Pain.   Comparison Study: Previous exam on 01/28/2021 was negative for DVT. Performing Technologist: Ezzie Potters RVT, RDMS  Examination Guidelines: A complete evaluation includes B-mode imaging, spectral Doppler, color Doppler, and power Doppler as needed of all accessible portions of each vessel. Bilateral testing is considered an integral part of a complete examination. Limited examinations for reoccurring indications may be performed as noted. The reflux portion of the exam is performed with the patient in reverse Trendelenburg.  +---------+---------------+---------+-----------+----------+--------------+ RIGHT    CompressibilityPhasicitySpontaneityPropertiesThrombus Aging +---------+---------------+---------+-----------+----------+--------------+ CFV      Full           Yes  Yes                                 +---------+---------------+---------+-----------+----------+--------------+ SFJ      Full                                                        +---------+---------------+---------+-----------+----------+--------------+ FV Prox  Full           Yes      Yes                                 +---------+---------------+---------+-----------+----------+--------------+ FV Mid   Full           Yes      Yes                                 +---------+---------------+---------+-----------+----------+--------------+ FV DistalFull           Yes      Yes                                 +---------+---------------+---------+-----------+----------+--------------+ PFV      Full                                                        +---------+---------------+---------+-----------+----------+--------------+ POP      Full           Yes      Yes                                 +---------+---------------+---------+-----------+----------+--------------+ PTV      Full                                                         +---------+---------------+---------+-----------+----------+--------------+ PERO     Full                                                        +---------+---------------+---------+-----------+----------+--------------+   +---------+---------------+---------+-----------+----------+--------------+ LEFT     CompressibilityPhasicitySpontaneityPropertiesThrombus Aging +---------+---------------+---------+-----------+----------+--------------+ CFV      Full           Yes      Yes                                 +---------+---------------+---------+-----------+----------+--------------+ SFJ      Full                                                        +---------+---------------+---------+-----------+----------+--------------+  FV Prox  Full           Yes      Yes                                 +---------+---------------+---------+-----------+----------+--------------+ FV Mid   Full           Yes      Yes                                 +---------+---------------+---------+-----------+----------+--------------+ FV DistalFull           Yes      Yes                                 +---------+---------------+---------+-----------+----------+--------------+ PFV      Full                                                        +---------+---------------+---------+-----------+----------+--------------+ POP      Full           Yes      Yes                                 +---------+---------------+---------+-----------+----------+--------------+ PTV      Full                                                        +---------+---------------+---------+-----------+----------+--------------+ PERO     Full                                                        +---------+---------------+---------+-----------+----------+--------------+     Summary: BILATERAL: - No evidence of deep vein thrombosis seen in the lower extremities, bilaterally. -No evidence of  popliteal cyst, bilaterally.   *See table(s) above for measurements and observations. Electronically signed by Penne Colorado MD on 02/01/2024 at 4:44:22 PM.    Final    DG Foot Complete Right Result Date: 02/01/2024 EXAM: [id number] CHL (XR FOOT 3 OR MORE VIEWS), [phone] CHL (XR FOOT 3 OR MORE VIEWS LEFT) COMPARISON: None available. CLINICAL HISTORY: Heel pain, swelling. bilateral foot swelling and pain, left more than right, for past week. FINDINGS: BONES AND JOINTS: No acute fracture. No focal osseous lesion. No joint dislocation. Right-sided anterior talar beaking. SOFT TISSUES: Crescentic calcific density projects over the lateral aspect of the fourth toe metatarsophalangeal joint in the left foot. Mild diffuse soft tissue swelling. IMPRESSION: 1. Mild diffuse soft tissue swelling, likely related to the reported bilateral foot swelling and pain. 2. 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 3. Crescentic calcific density projecting over the lateral left fourth toe metatarsophalangeal joint may represent a foreign body or be related to  sequela of remote injury. Electronically signed by: Manford Cummins MD 02/01/2024 02:07 PM EDT RP Workstation: [id number]   DG Foot Complete Left Result Date: 02/01/2024 EXAM: [id number] CHL (XR FOOT 3 OR MORE VIEWS), [id number] CHL (XR FOOT 3 OR MORE VIEWS LEFT) COMPARISON: None available. CLINICAL HISTORY: Heel pain, swelling. bilateral foot swelling and pain, left more than right, for past week. FINDINGS: BONES AND JOINTS: No acute fracture. No focal osseous lesion. No joint dislocation. Right-sided anterior talar beaking. SOFT TISSUES: Crescentic calcific density projects over the lateral aspect of the fourth toe metatarsophalangeal joint in the left foot. Mild diffuse soft tissue swelling. IMPRESSION: 1. Mild diffuse soft tissue swelling, likely related to the reported bilateral foot swelling and pain. 2.  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 3. Crescentic calcific density projecting over the lateral left fourth toe metatarsophalangeal joint may represent a foreign body or be related to sequela of remote injury. Electronically signed by: Manford Cummins MD 02/01/2024 02:07 PM EDT RP Workstation: [id number]   DG Chest 2 View Result Date: 02/01/2024 CLINICAL DATA:  Shortness of breath.  Congestive heart failure. EXAM: CHEST - 2 VIEW COMPARISON:  Radiographs 01/06/2024 and 01/05/2024.  CT 12/07/2022. FINDINGS: Left subclavian AICD leads appear unchanged, projecting over the right atrium, right ventricle and coronary sinus. Stable cardiomegaly. Previously demonstrated vascular congestion has resolved. There is no edema, confluent airspace disease, pleural effusion or pneumothorax. Stable mild degenerative changes in the spine without acute osseous abnormality. IMPRESSION: Interval resolution of previously demonstrated vascular congestion. No acute cardiopulmonary process. Stable cardiomegaly. Electronically Signed   By: Elsie Perone M.D.   On: 02/01/2024 12:17    Microbiology: No results found for this or any previous visit (from the past 240 hours).   Labs: Basic Metabolic Panel: Recent Labs  Lab 02/19/24 0305 02/20/24 0742 02/21/24 1044 02/22/24 0739 02/25/24 0250  NA 137 136 136 134* 137  K 3.7 3.9 4.2 3.9 3.7  CL 104 97* 100 94* 98  CO2 27 28 27 27 28   GLUCOSE 198* 207* 249* 239* 217*  BUN 12 12 22* 24* 24*  CREATININE 0.78 0.96 1.28* 1.09 1.05  CALCIUM  8.6* 9.4 9.1 9.2 8.7*  MG  --  1.8 2.2 2.2  --    Liver Function Tests: No results for input(s): AST, ALT, ALKPHOS, BILITOT, PROT, ALBUMIN in the last 168 hours. No results for input(s): LIPASE, AMYLASE in the last 168 hours. No results for input(s): AMMONIA in the last 168 hours. CBC: Recent Labs  Lab 02/18/24 1731 02/19/24 0305  WBC 9.7 9.6   NEUTROABS 6.3  --   HGB 11.4* 12.1*  HCT 34.9* 37.1*  MCV 89.9 88.5  PLT 277 273   Cardiac Enzymes: No results for input(s): CKTOTAL, CKMB, CKMBINDEX, TROPONINI in the last 168 hours. BNP: BNP (last 3 results) Recent Labs    01/25/24 1035 02/01/24 1057 02/18/24 1731  BNP 1,164.8* 466.5* 452.1*    ProBNP (last 3 results) No results for input(s): PROBNP in the last 8760 hours.  CBG: Recent Labs  Lab 02/24/24 0603 02/24/24 1141 02/24/24 1552 02/24/24 2047 02/25/24 0556  GLUCAP 170* 244* 287* 261* 264*       Signed:  Sigurd Pac MD.  [facility] 02/25/2024, 1:45 PM

## 2024-02-25 NOTE — TOC Progression Note (Signed)
 Transition of Care [facility]) - Progression Note    Patient Details  Name: Luis [name] MRN: [id number] Date of Birth: [birth year]  [facility] CM/SW Contact  [name], [place] Phone Number: 02/25/2024, 10:18 AM  Clinical Narrative:   Patient asked CSW to call [facility] case worker [facility] - [facility] [phone]). CSW left VM for [place].   Patient stated he did not want to discharge to a shelter. Per RNCM, patient stated he will go to a hotel at discharge.   CSW will continue to follow.    Expected Discharge Plan: Home w Home Health Services Barriers to Discharge: Continued Medical Work up               Expected Discharge Plan and Services In-house Referral: NA Discharge Planning Services: CM Consult Post Acute Care Choice: Home Health Living arrangements for the past 2 months: Apartment Expected Discharge Date: 02/25/24               DME Arranged: N/A DME Agency: NA       HH Arranged: NA           Social Drivers of Health (SDOH) Interventions SDOH Screenings   Food Insecurity: Patient Declined (02/19/2024)  Housing: Unknown (02/23/2024)  Transportation Needs: Patient Declined (02/19/2024)  Utilities: Patient Declined (02/19/2024)  Alcohol  Screen: Low Risk  (12/04/2022)  Depression (PHQ2-9): Medium Risk (11/16/2022)  Financial Resource Strain: High Risk (11/16/2022)  Social Connections: Unknown (08/26/2022)   Received from [facility]  Stress: No Stress Concern Present (04/25/2023)   Received from [facility]  Tobacco Use: High Risk (02/19/2024)    Readmission Risk Interventions    02/21/2024    4:31 PM 01/10/2024   11:50 AM  Readmission Risk Prevention Plan  Transportation Screening Complete Complete  Medication Review (RN Care Manager) Complete Complete  PCP or Specialist appointment within 3-5 days of discharge  Complete  HRI or Home Care Consult Complete Complete  Palliative Care Screening Not Applicable Not  Applicable  Skilled Nursing Facility Not Applicable Not Applicable

## 2024-02-25 NOTE — Progress Notes (Signed)
   02/25/24 1028  Spiritual Encounters  Type of Visit Initial  Care provided to: Patient  Referral source Clinical staff  Reason for visit Routine spiritual support  OnCall Visit No  Spiritual Framework  Presenting Themes Impactful experiences and emotions  Community/Connection None  Needs/Challenges/Barriers Need support mental health/ substance abuse  Patient Stress Factors Health changes  Family Stress Factors None identified  Interventions  Spiritual Care Interventions Made Compassionate presence;Established relationship of care and support;Prayer  Intervention Outcomes  Outcomes Awareness of health;Awareness of support;Reduced anxiety   Pt expressed deep grief, exhaustion, and hopelessness related to multiple losses (death of young son, housing, relationships) and shared ongoing struggles with substance use as a coping mechanism.  He verbalized both despair (tired, ready to go) and hoped (God told not yet, I have work to do). Chaplain provided active listening, spiritual support, and theological reflection (stories of Jolynn and Penne) to affirm Pt's sense of purpose. Pt was engaged, receptive, and tearful. Chaplain encouraged ongoing spiritual reflection and offered continued availability for support.

## 2024-02-25 NOTE — TOC Progression Note (Signed)
 Transition of Care [facility]) - Progression Note    Patient Details  Name: Luis [name] MRN: [id number] Date of Birth: 12/09/1970  [facility]) CM/SW Contact  [name], RN Phone Number: 02/25/2024, 10:14 AM  Clinical Narrative:    NCM spoke with patient at the bedside, asked which hotel he will be going to so could let the [facility] agency know,  he states he is not sure,  this NCM informed him that the NCM he spoke with yesterdays  has noted the [employer] rates are 54.00 and this NCM gave him the phone number he is calling to get a room.  He states he is not ready for dc today.   He said he is hurting in his Chest, NCM will let doctor know.   Expected Discharge Plan: Home w Home Health Services Barriers to Discharge: Continued Medical Work up               Expected Discharge Plan and Services In-house Referral: NA Discharge Planning Services: CM Consult Post Acute Care Choice: Home Health Living arrangements for the past 2 months: Apartment Expected Discharge Date: 02/25/24               DME Arranged: N/A DME Agency: NA       HH Arranged: NA           Social Drivers of Health (SDOH) Interventions SDOH Screenings   Food Insecurity: Patient Declined (02/19/2024)  Housing: Unknown (02/23/2024)  Transportation Needs: Patient Declined (02/19/2024)  Utilities: Patient Declined (02/19/2024)  Alcohol  Screen: Low Risk  (12/04/2022)  Depression (PHQ2-9): Medium Risk (11/16/2022)  Financial Resource Strain: High Risk (11/16/2022)  Social Connections: Unknown (08/26/2022)   Received from [facility]  Stress: No Stress Concern Present (04/25/2023)   Received from [facility]  Tobacco Use: High Risk (02/19/2024)    Readmission Risk Interventions    02/21/2024    4:31 PM 01/10/2024   11:50 AM  Readmission Risk Prevention Plan  Transportation Screening Complete Complete  Medication Review (RN Care Manager) Complete Complete  PCP or Specialist  appointment within 3-5 days of discharge  Complete  HRI or Home Care Consult Complete Complete  Palliative Care Screening Not Applicable Not Applicable  Skilled Nursing Facility Not Applicable Not Applicable

## 2024-03-01 ENCOUNTER — Telehealth (HOSPITAL_COMMUNITY): Payer: Self-pay

## 2024-03-01 NOTE — Telephone Encounter (Signed)
 Called to confirm/remind patient of their appointment at the [facility] on 03/02/24.   Appointment:   [] Confirmed  [] Left mess   [x] No answer/No voice mail  [] VM Full/unable to leave message  [] Phone not in service

## 2024-03-02 ENCOUNTER — Encounter (HOSPITAL_COMMUNITY)

## 2024-03-02 NOTE — Progress Notes (Incomplete)
 [facility] CLIN[facility] NOTE PCP: [facility] Cardiologist: Dr. Anner HF Cardiologist: Dr. Rolan  HPI: Luis [name] is a 53 y.o. male with h/o chronic systolic heart failure, VT s/p ICD (MDT CRT-D), HTN, HLD, Type 2 DM, OSA on CPAP and h/o substance use (cocaine).   He has a reported h/o CAD/PCI in Care Everywhere though his cardiomyopathy has been classified as NICM. Unfortunately, I am unable to locate LHC/ angiographic details in Care Everywhere. Echos from 2021-2023 have shown LVEF ranging from 25-35%, RV mildly reduced.  Cardiomyopathy felt likely caused by cocaine use.    Admitted 9/23 for acute on chronic systolic CHF.  ICD was also noted to be at [facility]. Echo showed EF 25% w/ global HK, RV mildly reduced. He was diuresed w/ IV Lasix  and underwent RHC revealing normal filling pressures and reduced CO w/ CI of 1.82 L/min/m. He was placed on GDMT and transitioned to PO torsemide . Underwent gen change on 03/23/22 prior to d/c. D/c wt 296 lb. Referred to [facility] clinic.   Seen in [facility] on 04/14/22 w/ NYHA Class IIIb symptoms and fluid overload. Diuretics increased set up for RHC.    R/LHC (10/23) showed elevated filling pressures and low output. mRA 14, PAP 45/22, mPCWP 27, LVEDP 32, PA sat 60%, CI 1.77. LHC showed no significant CAD. He was directly admitted from cath lab for IV diuresis and initiation of inotropic support w/ milrinone . Milrinone  weaned and GDMT titrated. Found to have RUE DVT, PICC removed and placed on Eliquis . Felt not to be a LVAD/transplant candidate due to OHS/OSA, poor mobility and social situation. Discharged home, weight 283 lbs.  He has not been seen in HF clinic since 11/23. Patient was admitted earlier this month with acute respiratory failure requiring intubation in setting of acute on chronic CHF. He had been noncompliant with HF medications. Echo with LVEF less than 20. He was diuresed and GDMT restarted. Discharge weight 254 lb.  He returns today for  heart failure follow up with caregiver. Disheveled appearing, will not take off sunglasses. Unable to perform thorough assessment on patient due to inability to sit still for exam or answer open-ended questions. Pacing around the room. Reports that this is due to pain in feet and ankles. Mild swelling in b/l ankles. Suspect drug use could be playing a role, would get UDS in ED. When asked if he has gout, says no. Caregiver in the room reports that this is worse then usual. Brought large bag of medications with >30 pill bottles, some expired, multilpe duplicates of medications with varying doses. He still does not know which medications from the bag he is taking but has 3 pill boxes that he fills at home. Was discharged with home health, however aid says that he has not scheduled initial visit.   ROS: All systems negative except as listed in HPI, PMH and Problem List.  SH:  Social History   Socioeconomic History   Marital status: Widowed    Spouse name: Not on file   Number of children: 7   Years of education: Not on file   Highest education level: Bachelor's degree (e.g., BA, AB, BS)  Occupational History   Occupation: disability  Tobacco Use   Smoking status: Every Day    Current packs/day: 0.00    Average packs/day: 0.5 packs/day for 15.0 years (7.5 ttl pk-yrs)    Types: Cigarettes    Start date: 10/2006    Last attempt to quit: 10/2021    Years  since quitting: 2.3   Smokeless tobacco: Never   Tobacco comments:    occasionally  Vaping Use   Vaping status: Never Used  Substance and Sexual Activity   Alcohol  use: Not Currently   Drug use: Yes    Types: Cocaine, Marijuana    Comment: using in binges, 1/4gm daily x 2 weeks.   Sexual activity: Not Currently  Other Topics Concern   Not on file  Social History Narrative   Not on file   Social Drivers of Health   Financial Resource Strain: High Risk (11/16/2022)   Overall Financial Resource Strain (CARDIA)    Difficulty of Paying  Living Expenses: Hard  Food Insecurity: Patient Declined (02/19/2024)   Hunger Vital Sign    Worried About Running Out of Food in the Last Year: Patient declined    Ran Out of Food in the Last Year: Patient declined  Transportation Needs: Patient Declined (02/19/2024)   PRAPARE - Administrator, Civil Service (Medical): Patient declined    Lack of Transportation (Non-Medical): Patient declined  Physical Activity: Not on file  Stress: No Stress Concern Present (04/25/2023)   Received from [facility] - Occupational Stress Questionnaire    Feeling of Stress : Not at all  Social Connections: Unknown (08/26/2022)   Received from [facility]   Social Network    Social Network: Not on file  Intimate Partner Violence: Patient Declined (02/19/2024)   Humiliation, Afraid, Rape, and Kick questionnaire    Fear of Current or Ex-Partner: Patient declined    Emotionally Abused: Patient declined    Physically Abused: Patient declined    Sexually Abused: Patient declined    FH:  Family History  Problem Relation Age of Onset   Hypertension Mother    Bone cancer Father    Lung cancer Father     Past Medical History:  Diagnosis Date   Anxiety    Biventricular ICD (implantable cardioverter-defibrillator) in place    Chronic systolic CHF (congestive heart failure) (HCC)    Cocaine use    Diabetes mellitus without complication (HCC)    GERD (gastroesophageal reflux disease)    HLD (hyperlipidemia)    Hypertension    Morbid obesity (HCC)    NICM (nonischemic cardiomyopathy) (HCC)    OSA (obstructive sleep apnea)    PTSD (post-traumatic stress disorder)    on Depakote    Refusal of blood transfusions as patient is Jehovah's Witness    Tobacco abuse     Current Outpatient Medications  Medication Sig Dispense Refill   atropine  1 % ophthalmic solution Place 1 drop into the right eye 2 (two) times daily.     bumetanide  (BUMEX ) 2 MG  tablet Take 1 tablet (2 mg total) by mouth 2 (two) times daily. 60 tablet 0   digoxin  (LANOXIN ) 0.125 MG tablet Take 1 tablet (125 mcg total) by mouth daily. 30 tablet 0   DULoxetine  (CYMBALTA ) 30 MG capsule Take 1 capsule (30 mg total) by mouth daily. 30 capsule 0   empagliflozin  (JARDIANCE ) 10 MG TABS tablet Take 1 tablet (10 mg total) by mouth daily. 30 tablet 0   gabapentin  (NEURONTIN ) 300 MG capsule Take 1-2 capsules (300-600 mg total) by mouth 2 (two) times daily. 120 capsule 0   hydrOXYzine  (VISTARIL ) 25 MG capsule Take 1 capsule (25 mg total) by mouth 3 (three) times daily as needed for anxiety. 30 capsule 0   isoniazid  (NYDRAZID ) 300 MG tablet Take 300 mg  by mouth in the morning.     mirtazapine  (REMERON ) 15 MG tablet Take 0.5 tablets (7.5 mg total) by mouth at bedtime as needed (sleep). 30 tablet 0   prednisoLONE  acetate (PRED FORTE ) 1 % ophthalmic suspension Place 1 drop into the right eye 4 (four) times daily.     pyridOXINE  (B-6) 50 MG tablet Take 50 mg by mouth daily.     rosuvastatin  (CRESTOR ) 40 MG tablet Take 0.5 tablets (20 mg total) by mouth daily. 30 tablet 0   sacubitril -valsartan  (ENTRESTO ) 24-26 MG Take 1 tablet by mouth 2 (two) times daily. 60 tablet 0   spironolactone  (ALDACTONE ) 25 MG tablet Take 1 tablet (25 mg total) by mouth daily. 30 tablet 0   No current facility-administered medications for this visit.   There were no vitals filed for this visit.   There were no vitals filed for this visit.   PHYSICAL EXAM (limited by patient): General: Disheveled. Cardiac: JVP elevated Resp: Tachypneic Extremities: BLE edema.   MDT Device Interrogation: Unable to perform device interrogation.   ASSESSMENT & PLAN:  1. Chronic HFrEF:  Nonischemic cardiomyopathy.  MDT CRT-D device.  Echoes 2021-2023, LVEF range 25-35%, RV mildly reduced.  Reportedly diagnosed with nonischemic cardiomyopathy in 2012 in [place] , etiology felt due to cocaine abuse.  [facility] cardiology  03/07/2019 notes mention patient has CAD with history of angioplasty in the past. Unable to locate cath reports/ details regarding coronary anatomy in record. Echo in 2/23 showed EF 25%, mild LV dilation, mildly decreased RV function. RHC in 9/23 showed low CI at 1.82. [facility] 10/23 showed elevated filling pressures and low output, mRA 14, PAP 45/22, mPCWP 27, LVEDP 32, PA sat 60%, CI 1.77 with no significant coronary disease. Echo 7/25: EF < 20%, RV okay. - Recent admit for acute on chronic CHF in setting of noncompliance with medications - Presented today as above. Unable to make medication changes as still unaware what patient is taking at home and unable to perform complete exam on patient. Sent to ED for further evaluation.  - Appears under the influence of illicit substance  - He is prescribed: Lasix  80 mg daily, digoxin  0.125 mcg daily, toprol  XL 25 mg bid - Not advanced therapies candidate given drug use and noncompliance  2. CAD: Documented in Care Everywhere, reported h/o angioplasty, details unknown (cannot find a cath report though prior CAD is referenced). LHC showed no significant CAD.  - prescribed Plavix    3. H/o VT: Has CRT-D w/ chronic systolic heart failure and QRS > 150 ms  - Gen change in 2023 - He had established with EP at [facility]. His device has not been followed. Not set up for remote monitoring. He has no showed multiple visits. Quick look interrogation today suggests VF episode earlier this month, no other details available. He refused to stay in clinic for rep to interrogate his device.   4. HTN: BP elevated - Suspect he is not taking meds correctly  5. Type 2DM: Uncontrolled, recent A1c 14. - He is on insulin .  6. Cocaine: Appears under the influence of illicit drugs. Would check UDS in ED.   7. Depression: Depressed due to lack of social/family support and food insecurity  - Continue Cymbalta .   8. Jehovah's Witness.   9. OHS/OSA: He uses oxygen during the day and  CPAP at night. Doubt adherence.  10. RUE DVT: Provoked.  - Now off Eliquis   11. SDOH: He has poor health literacy. Unfortunately not a candidate for  Paramedicine.  - He will be at very high risk for readmission  Caffie Shed, PA-C 03/02/24

## 2024-04-25 ENCOUNTER — Encounter

## 2024-05-05 ENCOUNTER — Telehealth: Payer: Self-pay

## 2024-05-05 NOTE — Telephone Encounter (Signed)
 Unsure of Pt's current location.  Recent visit to ER in [facility].  Previously admitted to [facility] in [place] on April 12, 2024.  Pt is not compliant with remote monitoring.  No transmission has ever been received from Pt per review of [facility].  Pt last seen January 2024.  Pt will remain active in [facility] until Pt establishes care.

## 2024-05-22 ENCOUNTER — Other Ambulatory Visit: Payer: Self-pay

## 2024-07-25 ENCOUNTER — Encounter

## 2024-10-24 ENCOUNTER — Encounter

## 2025-01-23 ENCOUNTER — Encounter

## 2025-04-24 ENCOUNTER — Encounter
